# Patient Record
Sex: Female | Born: 1940
Health system: Southern US, Community
[De-identification: ages and names within clinical notes are randomized; demographics above are authoritative.]

## PROBLEM LIST (undated history)

## (undated) DIAGNOSIS — I499 Cardiac arrhythmia, unspecified: Secondary | ICD-10-CM

## (undated) DIAGNOSIS — E039 Hypothyroidism, unspecified: Secondary | ICD-10-CM

## (undated) DIAGNOSIS — I4891 Unspecified atrial fibrillation: Secondary | ICD-10-CM

## (undated) DIAGNOSIS — N189 Chronic kidney disease, unspecified: Secondary | ICD-10-CM

## (undated) DIAGNOSIS — K219 Gastro-esophageal reflux disease without esophagitis: Secondary | ICD-10-CM

## (undated) DIAGNOSIS — M1990 Unspecified osteoarthritis, unspecified site: Secondary | ICD-10-CM

## (undated) DIAGNOSIS — Z5189 Encounter for other specified aftercare: Secondary | ICD-10-CM

## (undated) DIAGNOSIS — R0600 Dyspnea, unspecified: Secondary | ICD-10-CM

## (undated) DIAGNOSIS — I10 Essential (primary) hypertension: Secondary | ICD-10-CM

## (undated) DIAGNOSIS — I82409 Acute embolism and thrombosis of unspecified deep veins of unspecified lower extremity: Secondary | ICD-10-CM

## (undated) DIAGNOSIS — E119 Type 2 diabetes mellitus without complications: Secondary | ICD-10-CM

## (undated) DIAGNOSIS — Z87442 Personal history of urinary calculi: Secondary | ICD-10-CM

## (undated) DIAGNOSIS — D649 Anemia, unspecified: Secondary | ICD-10-CM

## (undated) DIAGNOSIS — Z8639 Personal history of other endocrine, nutritional and metabolic disease: Secondary | ICD-10-CM

## (undated) DIAGNOSIS — I2699 Other pulmonary embolism without acute cor pulmonale: Secondary | ICD-10-CM

## (undated) DIAGNOSIS — R51 Headache: Secondary | ICD-10-CM

## (undated) DIAGNOSIS — C50919 Malignant neoplasm of unspecified site of unspecified female breast: Secondary | ICD-10-CM

## (undated) HISTORY — DX: Chronic kidney disease, unspecified: N18.9

## (undated) HISTORY — DX: Acute embolism and thrombosis of unspecified deep veins of unspecified lower extremity: I82.409

## (undated) HISTORY — DX: Personal history of other endocrine, nutritional and metabolic disease: Z86.39

## (undated) HISTORY — DX: Other pulmonary embolism without acute cor pulmonale: I26.99

## (undated) HISTORY — DX: Encounter for other specified aftercare: Z51.89

## (undated) HISTORY — PX: JOINT REPLACEMENT: SHX530

## (undated) HISTORY — DX: Malignant neoplasm of unspecified site of unspecified female breast: C50.919

## (undated) HISTORY — PX: MULTIPLE TOOTH EXTRACTIONS: SHX2053

## (undated) HISTORY — PX: CATARACT EXTRACTION, BILATERAL: SHX1313

## (undated) HISTORY — PX: BILATERAL HIP ARTHROSCOPY: SUR89

## (undated) HISTORY — PX: PARATHYROIDECTOMY: SHX19

## (undated) HISTORY — PX: ABDOMINAL HYSTERECTOMY: SHX81

---

## 1998-01-16 ENCOUNTER — Ambulatory Visit (HOSPITAL_COMMUNITY): Admission: RE | Admit: 1998-01-16 | Discharge: 1998-01-16 | Payer: Self-pay | Admitting: Surgery

## 1998-02-12 ENCOUNTER — Ambulatory Visit (HOSPITAL_COMMUNITY): Admission: RE | Admit: 1998-02-12 | Discharge: 1998-02-13 | Payer: Self-pay | Admitting: Surgery

## 2003-03-20 ENCOUNTER — Encounter: Payer: Self-pay | Admitting: Orthopedic Surgery

## 2003-03-25 ENCOUNTER — Ambulatory Visit (HOSPITAL_COMMUNITY): Admission: RE | Admit: 2003-03-25 | Discharge: 2003-03-25 | Payer: Self-pay | Admitting: Orthopedic Surgery

## 2008-08-18 ENCOUNTER — Encounter: Admission: RE | Admit: 2008-08-18 | Discharge: 2008-11-04 | Payer: Self-pay | Admitting: Orthopedic Surgery

## 2010-09-01 ENCOUNTER — Ambulatory Visit: Payer: MEDICARE | Attending: Orthopedic Surgery | Admitting: Physical Therapy

## 2010-09-01 DIAGNOSIS — M25569 Pain in unspecified knee: Secondary | ICD-10-CM | POA: Insufficient documentation

## 2010-09-01 DIAGNOSIS — R293 Abnormal posture: Secondary | ICD-10-CM | POA: Insufficient documentation

## 2010-09-01 DIAGNOSIS — M2560 Stiffness of unspecified joint, not elsewhere classified: Secondary | ICD-10-CM | POA: Insufficient documentation

## 2010-09-01 DIAGNOSIS — IMO0001 Reserved for inherently not codable concepts without codable children: Secondary | ICD-10-CM | POA: Insufficient documentation

## 2010-09-01 DIAGNOSIS — M545 Low back pain, unspecified: Secondary | ICD-10-CM | POA: Insufficient documentation

## 2010-09-01 DIAGNOSIS — R5381 Other malaise: Secondary | ICD-10-CM | POA: Insufficient documentation

## 2010-09-02 ENCOUNTER — Ambulatory Visit: Payer: MEDICARE | Admitting: Physical Therapy

## 2010-09-06 ENCOUNTER — Ambulatory Visit: Payer: MEDICARE | Admitting: Physical Therapy

## 2010-09-08 ENCOUNTER — Ambulatory Visit: Payer: MEDICARE | Admitting: Physical Therapy

## 2010-09-13 ENCOUNTER — Ambulatory Visit: Payer: MEDICARE | Admitting: Physical Therapy

## 2010-09-15 ENCOUNTER — Ambulatory Visit: Payer: MEDICARE | Admitting: Physical Therapy

## 2010-09-16 ENCOUNTER — Ambulatory Visit: Payer: MEDICARE | Admitting: *Deleted

## 2010-09-20 ENCOUNTER — Ambulatory Visit: Payer: MEDICARE | Admitting: Physical Therapy

## 2010-09-22 ENCOUNTER — Encounter: Payer: MEDICARE | Admitting: Physical Therapy

## 2010-12-10 NOTE — Op Note (Signed)
NAME:  [name] [name], [name] [name]                           ACCOUNT NO.:  [id number]   MEDICAL RECORD NO.:  [id number]                   PATIENT TYPE:  AMB   LOCATION:  DAY                                  FACILITY:  [facility]   PHYSICIAN:  Marlowe Kays, M.D.               DATE OF BIRTH:  1940-09-15   DATE OF PROCEDURE:  03/25/2003  DATE OF DISCHARGE:                                 OPERATIVE REPORT   PREOPERATIVE DIAGNOSES:  1. Torn lateral meniscus.  2. Osteoarthritis, right knee.   POSTOPERATIVE DIAGNOSES:  1. Torn lateral meniscus.  2. Osteoarthritis, right knee.   OPERATION:  1. Right knee arthroscopy with one partial lateral meniscectomy.  2. Removal of loose bodies.  3. Shaving of patella, medial and lateral femoral condyle.   SURGEON:  Marlowe Kays, M.D.   ASSISTANT:  Nurse.   ANESTHESIA:  General.   INDICATIONS FOR PROCEDURE:  She had the sudden onset of pain on 01/31/03,  which had been unrelenting in the right knee.  Xrays show some  osteoarthritis, but an MRI in addition shows a torn lateral meniscus.  Because of this it was felt that she would benefit from arthroscopic  procedure, with the understanding that this might not do much for her  osteoarthritic problems.   DESCRIPTION OF PROCEDURE:  Under successful general endotracheal anesthesia,  a pneumatic tourniquet placed and stabilizer.  The right knee was prepped  with Duraprep and draped in a sterile field.  Superomedial saline inflow.  First on looking in the medial joint through an anterolateral portal, she  had a marked amount of synovitis and at least one loose body was noted.  This was removed with pituitary rongeur.  She had significant wear of the  medial femoral condyle (grade 3 out of 4).  I began debriding all this down,  finding that she also had full thickness wear of the anterior portion of the  medial tibial plateau as well.  After debriding up the medial compartment of  the knee joint, the medial  meniscus was noted to be a little worn, but not  torn and did not require surgical treatment.  I then looked up into the  medial and lateral suprapatellar area; there was some moderate wear of the  patella, which I also shaved.  I then reversed portals, looking laterally.  She once again had a large amount of synovitis, which I resected.  The  lateral meniscus was torn throughout most of its inner extent, and we shaved  down until smooth.  I also debrided down the lateral femoral condyle.  The  knee joint was then irrigated until clear and all fluid possible removed.  The two-inch portals were closed with 4-0 nylon.  Marcaine 0.5% 20 cc with  adrenaline and 4 mg of  morphine were then instilled through the end flap; which was removed and  this portal closed with 4-0 nylon  as well.  Betadine and Adaptic dry sterile  dressing were applied.  The tourniquet was released.  She tolerated the  procedure well and was taken to the recovery room in satisfactory condition.  There were no complications.                                               Marlowe Kays, M.D.    JA/MEDQ  D:  03/25/2003  T:  03/25/2003  Job:  [id number]

## 2011-01-24 ENCOUNTER — Other Ambulatory Visit: Payer: Self-pay | Admitting: Orthopedic Surgery

## 2011-01-24 ENCOUNTER — Encounter (HOSPITAL_COMMUNITY): Payer: Medicare Other

## 2011-01-24 ENCOUNTER — Other Ambulatory Visit (HOSPITAL_COMMUNITY): Payer: Self-pay | Admitting: Orthopedic Surgery

## 2011-01-24 ENCOUNTER — Ambulatory Visit (HOSPITAL_COMMUNITY)
Admission: RE | Admit: 2011-01-24 | Discharge: 2011-01-24 | Disposition: A | Discharge status: Home / Self Care | Payer: Medicare Other | Source: Ambulatory Visit | Attending: Orthopedic Surgery | Admitting: Orthopedic Surgery

## 2011-01-24 DIAGNOSIS — Z01818 Encounter for other preprocedural examination: Secondary | ICD-10-CM

## 2011-01-24 DIAGNOSIS — M47814 Spondylosis without myelopathy or radiculopathy, thoracic region: Secondary | ICD-10-CM | POA: Insufficient documentation

## 2011-01-24 DIAGNOSIS — M1610 Unilateral primary osteoarthritis, unspecified hip: Secondary | ICD-10-CM | POA: Insufficient documentation

## 2011-01-24 DIAGNOSIS — Z01812 Encounter for preprocedural laboratory examination: Secondary | ICD-10-CM | POA: Insufficient documentation

## 2011-01-24 DIAGNOSIS — M169 Osteoarthritis of hip, unspecified: Secondary | ICD-10-CM | POA: Insufficient documentation

## 2011-01-24 DIAGNOSIS — Z79899 Other long term (current) drug therapy: Secondary | ICD-10-CM | POA: Insufficient documentation

## 2011-01-24 DIAGNOSIS — M4130 Thoracogenic scoliosis, site unspecified: Secondary | ICD-10-CM | POA: Insufficient documentation

## 2011-01-24 LAB — SURGICAL PCR SCREEN
MRSA, PCR: NEGATIVE
Staphylococcus aureus: POSITIVE — AB

## 2011-01-24 LAB — URINE MICROSCOPIC-ADD ON

## 2011-01-24 LAB — DIFFERENTIAL
Basophils Absolute: 0 10*3/uL (ref 0.0–0.1)
Eosinophils Relative: 1 % (ref 0–5)
Monocytes Relative: 16 % — ABNORMAL HIGH (ref 3–12)
Neutro Abs: 2.2 10*3/uL (ref 1.7–7.7)

## 2011-01-24 LAB — URINALYSIS, ROUTINE W REFLEX MICROSCOPIC
Glucose, UA: NEGATIVE mg/dL
Hgb urine dipstick: NEGATIVE
Ketones, ur: NEGATIVE mg/dL
Protein, ur: NEGATIVE mg/dL
Specific Gravity, Urine: 1.018 (ref 1.005–1.030)
pH: 6 (ref 5.0–8.0)

## 2011-01-24 LAB — APTT: aPTT: 31 seconds (ref 24–37)

## 2011-01-24 LAB — CBC
HCT: 38 % (ref 36.0–46.0)
MCH: 29.3 pg (ref 26.0–34.0)
MCHC: 33.4 g/dL (ref 30.0–36.0)
Platelets: 194 10*3/uL (ref 150–400)
RBC: 4.33 MIL/uL (ref 3.87–5.11)
WBC: 4.7 10*3/uL (ref 4.0–10.5)

## 2011-01-24 LAB — BASIC METABOLIC PANEL
CO2: 34 mEq/L — ABNORMAL HIGH (ref 19–32)
Chloride: 100 mEq/L (ref 96–112)
Glucose, Bld: 97 mg/dL (ref 70–99)
Potassium: 4.1 mEq/L (ref 3.5–5.1)
Sodium: 140 mEq/L (ref 135–145)

## 2011-01-24 LAB — PROTIME-INR: INR: 0.99 (ref 0.00–1.49)

## 2011-01-31 NOTE — H&P (Addendum)
[name] [name], [name] [name] NO.:  [id number]  MEDICAL RECORD NO.:  [id number]  LOCATION:                                 FACILITY:  PHYSICIAN:  Madlyn Frankel. Charlann Boxer, M.D.  DATE OF BIRTH:  1940/10/03  DATE OF ADMISSION: DATE OF DISCHARGE:                             HISTORY & PHYSICAL   DATE OF SURGERY:  February 01, 2011  ADMISSION DIAGNOSIS:  Left hip osteoarthritis.  HISTORY OF PRESENT ILLNESS:  This is a 70 year old lady with a history of osteoarthritis of the left hip with failure of conservative treatment to alleviate her pain.  She is now scheduled for anterior total hip arthroplasty.  Surgery risks, benefits, and aftercare were discussed with the patient.  Questions invited and answered.  Note that she is a candidate for tranexamic acid and will receive that at surgery.  She is given her home medications of aspirin, Robaxin, iron, MiraLax, and Colace to take postoperatively.  Her medical doctor is Dr. Prudy Feeler.  PAST MEDICAL HISTORY:  Drug allergies none.  CURRENT MEDICATIONS: 1. Meloxicam 7.5 mg one daily. 2. Hydrochlorothiazide 50 mg one daily. 3. Verapamil 120 mg one b.i.d. 4. Levothyroxine 25 mcg one daily. 5. Valproic acid 250 mg one b.i.d. 6. Pravastatin 40 mg one daily. 7. Oxybutynin 5 mg one b.i.d. 8. Tramadol 50 mg p.r.n. 9. Oxycodone p.r.n.  MEDICAL ILLNESSES:  Include, hypertension, hypothyroidism, tachycardia, hyperlipidemia.  Also, history of migraines.  PREVIOUS SURGERIES:  Include, hysterectomy, parathyroid surgery, and arthroscopic knee surgery on the right.  FAMILY HISTORY:  Positive for heart disease, cancer, pulmonary embolus in a brother, and heart attack.  SOCIAL HISTORY:  The patient is married.  She lives at home.  She does not smoke and does not drink.  REVIEW OF SYSTEMS:  CENTRAL NERVOUS SYSTEM:  Negative for headache, blurred vision, or dizziness.  PULMONARY:  Negative for shortness of breath, PND, and orthopnea.   CARDIOVASCULAR:  Negative for chest pain or palpitation.  Positive for occasional rapid heart rate.  GI:  Negative for ulcers or hepatitis.  GU:  Negative for urinary tract difficulties so she had a recent UTI that was treated.  MUSCULOSKELETAL:  Positive in HPI.  PHYSICAL EXAM:  VITAL SIGNS:  BP 160/82, respirations 16, pulse 72 and regular. GENERAL APPEARANCE:  This is an obese lady in no acute distress. HEENT:  Head normocephalic.  Nose patent.  Ears patent.  Pupils are equal, round, reactive to light.  Throat without injection. NECK:  Supple without adenopathy.  Carotids are 2+ without bruit. CHEST:  Clear to auscultation.  No rales or rhonchi.  Respirations 60. HEART:  Regular rate and rhythm at 72 beats without murmur. ABDOMEN:  Soft.  Active bowel sounds.  No masses or organomegaly. NEUROLOGIC:  The patient is alert and oriented to time, place, and person.  Cranial nerves II-XII grossly intact. EXTREMITIES:  Shows left hip with decreased range of motion with pain to any range of motion.  Neurovascular status intact.  ASSESSMENT:  Left hip osteoarthritis.  PLAN:  Left total hip arthroplasty by anterior approach.     Jaquelyn Bitter. Chabon, P.A.   ______________________________ Madlyn Frankel Charlann Boxer, M.D.  SJC/MEDQ  D:  01/19/2011  T:  01/20/2011  Job:  [id number]  Electronically Signed by Jodene Nam P.A. on 01/30/2011 04:47:56 PM Electronically Signed by Durene Romans M.D. on 01/31/2011 09:12:57 AM

## 2011-02-01 ENCOUNTER — Inpatient Hospital Stay (HOSPITAL_COMMUNITY)
Admission: RE | Admit: 2011-02-01 | Discharge: 2011-02-04 | DRG: 470 | Disposition: A | Discharge status: Home Health | Payer: Medicare Other | Source: Ambulatory Visit | Attending: Orthopedic Surgery | Admitting: Orthopedic Surgery

## 2011-02-01 ENCOUNTER — Inpatient Hospital Stay (HOSPITAL_COMMUNITY): Payer: Medicare Other

## 2011-02-01 DIAGNOSIS — I10 Essential (primary) hypertension: Secondary | ICD-10-CM | POA: Diagnosis present

## 2011-02-01 DIAGNOSIS — M169 Osteoarthritis of hip, unspecified: Principal | ICD-10-CM | POA: Diagnosis present

## 2011-02-01 DIAGNOSIS — R000 Tachycardia, unspecified: Secondary | ICD-10-CM | POA: Diagnosis present

## 2011-02-01 DIAGNOSIS — M1610 Unilateral primary osteoarthritis, unspecified hip: Principal | ICD-10-CM | POA: Diagnosis present

## 2011-02-01 DIAGNOSIS — E785 Hyperlipidemia, unspecified: Secondary | ICD-10-CM | POA: Diagnosis present

## 2011-02-01 DIAGNOSIS — E039 Hypothyroidism, unspecified: Secondary | ICD-10-CM | POA: Diagnosis present

## 2011-02-01 LAB — TYPE AND SCREEN: ABO/RH(D): O POS

## 2011-02-01 NOTE — Op Note (Signed)
NAMEJONNIE, [name] [name] NO.:  [id number]  MEDICAL RECORD NO.:  [id number]  LOCATION:  0005                         FACILITY:  [facility]  PHYSICIAN:  Madlyn Frankel. Charlann Boxer, M.D.  DATE OF BIRTH:  June 06, 1941  DATE OF PROCEDURE:  02/01/2011 DATE OF DISCHARGE:                              OPERATIVE REPORT   PREOPERATIVE DIAGNOSIS:  Left hip osteoarthritis.  POSTOPERATIVE DIAGNOSIS:  Left hip osteoarthritis.  PROCEDURE:  Left total hip replacement through an anterior approach.  COMPONENTS USED:  A DePuy hip system, size 50 pinnacle cup, 32 plus 4 neutral AltrX liner, size 4 standard trial lock stem with a 32 plus 5 delta ceramic ball.  SURGEON:  Madlyn Frankel. Charlann Boxer, M.D.  ASSISTANT:  Jaquelyn Bitter. Chabon, P.A.  ANESTHESIA:  General.  SPECIMENS:  None.  COMPLICATIONS:  None apparent.  BLOOD LOSS:  600 mL.  DRAINS:  One Hemovac.  INDICATIONS FOR PROCEDURE:  [name] [name] is a 70 year old female who presented to the office for evaluation of left hip pain.  Radiographs revealed degenerative changes with excessive wear of femoral head with associated notching.  Conservative measures failed to provide any significant relief.  Consent was obtained after reviewing risks of infection, DVT, component failure, need for revision surgery.  We discussed options of surgical approach anterior versus posterior and chose for anterior left total hip replacement.  Consent obtained for benefit of pain relief.  PROCEDURE IN DETAIL:  The patient was brought to operative theater. Once adequate anesthesia, preoperative antibiotics, Ancef administered, the patient was positioned supine on the [employer] table.  The left hip was pre-draped and then fluoroscopic imaging used to confirm landmarks and positioning.  The left lower extremity was then prepped and draped to allow for anterior approach to the hip with a shower curtain technique.  A time- out performed, identifying the patient, planned  procedure and extremity.  An incision was made 2 cm lateral and inferior to the anterior-superior iliac spine, extending over the boundary of the tensor fascia lata muscle.  Dissection patient was carried down to the fascia which was then incised in line with its fibers.  The muscles swept laterally and a superior retractor placed.  Pericapsular fat and circumflex vessels were cauterized and debrided. Inferior neck retractor placed.  Following elevating the rectus off the anterior acetabulum, a capsulotomy was made, preserving the anterior, inferior and superior leaflets.  Stay sutures were placed and retractors placed intra-articular.  The patient's obvious femoral head deformity was noted.  At this point, the neck osteotomy had been made following confirmation radiographs and confirming location of anatomy.  At this point, traction was taken off the femur.  Femoral head was removed without difficulty.  Acetabular retractors placed posterior and anterior.  Labrum and soft tissues debrided.  I began reaming with 45 reamer, reamed up to 49 reamer with excellent bony bed preparation.  A final 50 cup was chosen under fluoroscopic imaging, the cup was impacted and well seated and confirmed in its orientation with regards to abduction and anatomically with posterior cup.  Portion of the cup exposed posteriorly, confirmed anatomic forward flexion.  Single cancellous screw was used to support this and  the hole eliminator placed.  Final 32 plus 4 neutral liner was then impacted.  Attention was now directed to femur.  The femur was rotated out to 80 degrees and inferior capsule removed off the neck, allowed for medial retractor.  The hook was placed laterally over the trochanteric ridge. The leg was extended and adducted and retractors placed posteriorly allowing for partial release of the posterior structures and exposure of the posterior aspect of the hip.  Box osteotome was used to  set orientation followed by the use of a starting broach which was passed by hand.  I then broached with a size 1 broach and at this point confirmed the orientation of the stem in the AP and lateral planes at 90 degrees orthogonal views with retractors out and the leg up in a neutral position.  The leg was then re-extended, retractors were placed and ended up broaching to a size 4 broach with excellent metaphyseal fit.  Did a trial reduction with standard neck based off for radiographic template and 32 plus 1.5 ball.  With this, the leg lengths appeared to be very comparable and maybe a little bit shorter on this left side but otherwise good component orientation and stability noted.  The trials were removed.  The final four standard trial lock stem was chosen and impacted, basically sitting at the level where the broach was.  Based on this and the fact that I felt the knee had a little bit of leg length, I chose a 32 plus 5 delta ceramic ball to help get a couple more millimeters in length.  It would not affect her overall as her right hip does have some arthritic changes as well.  The final 32 plus 5 delta ceramic ball was impacted onto clean and dry trunnion and the hip reduced.  We irrigated the hip throughout the case and again at this point, I reapproximated the anterior capsular leaflets using #1 Vicryl.  I then placed a medium Hemovac drain deep to the tensor fascia muscle on top of the capsule.  The tensor fascia was then reapproximated using #1 Vicryl.  The remainder of the wound was closed with 2-0 Vicryl and running 4-0 Monocryl.  The hip was cleaned, dried and dressed sterilely using Dermabond and Aquacel dressing.  Drain site dressed separately.  The patient was brought to recovery room in stable condition, tolerating the procedure well.     Madlyn Frankel Charlann Boxer, M.D.     MDO/MEDQ  D:  02/01/2011  T:  02/01/2011  Job:  [id number]  Electronically Signed by Durene Romans  M.D. on 02/01/2011 06:18:11 PM

## 2011-02-02 LAB — BASIC METABOLIC PANEL
Calcium: 9.7 mg/dL (ref 8.4–10.5)
Chloride: 101 mEq/L (ref 96–112)
Creatinine, Ser: 0.71 mg/dL (ref 0.50–1.10)
GFR calc Af Amer: >60 mL/min (ref >60)
Sodium: 139 mEq/L (ref 135–145)

## 2011-02-02 LAB — CBC
MCH: 30.2 pg (ref 26.0–34.0)
MCV: 88.2 fL (ref 78.0–100.0)
Platelets: 147 10*3/uL — ABNORMAL LOW (ref 150–400)
RBC: 3.31 MIL/uL — ABNORMAL LOW (ref 3.87–5.11)
RDW: 12.9 % (ref 11.5–15.5)

## 2011-02-03 LAB — CBC
Platelets: 165 10*3/uL (ref 150–400)
RBC: 3.5 MIL/uL — ABNORMAL LOW (ref 3.87–5.11)
RDW: 13.3 % (ref 11.5–15.5)
WBC: 10.9 10*3/uL — ABNORMAL HIGH (ref 4.0–10.5)

## 2011-02-03 LAB — BASIC METABOLIC PANEL
CO2: 32 mEq/L (ref 19–32)
Chloride: 100 mEq/L (ref 96–112)
GFR calc Af Amer: >60 mL/min (ref >60)
Potassium: 3.2 mEq/L — ABNORMAL LOW (ref 3.5–5.1)
Sodium: 140 mEq/L (ref 135–145)

## 2011-03-02 NOTE — Discharge Summary (Addendum)
[name] [name], [name] NO.:  [id number]  MEDICAL RECORD NO.:  [id number]  LOCATION:  1539                         FACILITY:  [facility]  PHYSICIAN:  Madlyn Frankel. Charlann Boxer, M.D.  DATE OF BIRTH:  19-Oct-1940  DATE OF ADMISSION:  02/01/2011 DATE OF DISCHARGE:  02/04/2011                              DISCHARGE SUMMARY   PROCEDURE:  Left total hip arthroplasty.  ADMITTING DIAGNOSIS:  Left hip osteoarthritis.  DISCHARGE DIAGNOSES: 1. Status post left total hip arthroplasty. 2. Hypertension. 3. Hypothyroidism. 4. Tachycardia. 5. Hyperlipidemia. 6. History of migraines.  HISTORY OF PRESENT ILLNESS:  The patient is a 70 year old lady with a history of osteoarthritis of the left hip with failure conservative treatment to alleviate her pain.  X-rays in the clinic showed osteoarthritis of the hip.  Various options were discussed with the patient.  The patient wished to proceed with surgery.  Risks, benefits, and expectations of the procedure were discussed with patient.  The patient understands the risks, benefits, and expectations and wishes to proceed with left total hip arthroplasty.  Questions were invited and answered, and she is a candidate for tranexamic acid and received prior to surgery.  She has been given her home medications and will be discharged home once again discharged from the hospital.  HOSPITAL COURSE:  The patient underwent the above-stated procedure on February 01, 2011.  The patient tolerated the procedure well and was brought to the recovery room in good condition and subsequently to the floor. Postop day #1, the patient was doing well, afebrile.  Vital signs stable.  Hematocrit 29.2.  The patient was neurovascularly intact. Dressing was clear, dry, and intact.  Hemovac was removed and the IV was changed to saline lock.  The patient was started PT/OT.  On postop day #2, February 03, 2011, the patient was doing okay, but feeling very sleepy. She was afebrile.   Vital signs stable.  Hematocrit 31.7.  Left hip was dry.  On postop day #3, July 13. 2012, the patient was doing much better.  This morning, I felt that she was ready to go home.  She was afebrile.  Vital signs stable.  Left hip dressing was clear, dry, and intact.  Neurovascularly intact.  The patient felt to be good and well enough to go home after physial therapy.  DISCHARGE INSTRUCTIONS:  The patient was discharged on February 04, 2011. The patient was maintained the surgical dressing for 8 days and removed and replaced with gauze and tape.  The patient is to keep the area dry and clean until follows up.  The patient is to follow up with Dr. Charlann Boxer with the [facility] in 2 weeks.  DISCHARGE MEDICATIONS: 1. Colace 100 mg 1 p.o. b.i.d. p.r.n. constipation. 2. Enteric coated aspirin 325 mg 1 p.o. b.i.d. for 6 weeks. 3. Ferrous sulfate 325 mg 1 p.o. t.i.d. for 2-3 weeks. 4. Robaxin 500 mg 1 p.o. q.6 h. p.r.n. muscle spasms. 5. Oxycodone IR 5-10 mg p.o. q.4-6 h. p.r.n. pain. 6. Xarelto 10 mg 1 p.o. daily for 10 days. 7. Hydrochlorothiazide 50 mg 1 p.o. q.a.m. 8. Levothyroxine 25 mcg 1 p.o. q.h.s. 9. Meloxicam 7.5 1 p.o. q.a.m.  10.Multivitamin 1 p.o. daily. 11.Oxybutynin 5 mg 1 p.o. b.i.d. 12.Pravastatin 40 mg 1 p.o. q.a.m. 13.Verapamil 120 mg 1 p.o. q.a.m. 14.Valproic acid 250 mg 1 p.o. b.i.d.    ______________________________ Lanney Gins, PA   ______________________________ Madlyn Frankel. Charlann Boxer, M.D.    MB/MEDQ  D:  02/23/2011  T:  02/24/2011  Job:  [id number]  Electronically Signed by Lanney Gins PA on 03/02/2011 04:08:46 PM Electronically Signed by Durene Romans M.D. on 03/07/2011 07:32:40 AM Electronically Signed by Durene Romans M.D. on 03/07/2011 07:32:30 AM

## 2011-05-31 ENCOUNTER — Encounter: Payer: Self-pay | Admitting: Oncology

## 2011-06-01 ENCOUNTER — Encounter: Payer: Medicare Other | Admitting: Oncology

## 2011-06-01 ENCOUNTER — Other Ambulatory Visit: Payer: Medicare Other | Admitting: Lab

## 2011-06-01 ENCOUNTER — Ambulatory Visit: Payer: Medicare Other

## 2011-06-02 NOTE — Progress Notes (Signed)
This encounter was created in error - please disregard.

## 2013-10-08 NOTE — Progress Notes (Signed)
Surgery on 10/29/13.  Need orders in EPIC.  Thank You. 

## 2013-10-16 ENCOUNTER — Encounter (HOSPITAL_COMMUNITY): Payer: Self-pay | Admitting: Pharmacy Technician

## 2013-10-22 ENCOUNTER — Ambulatory Visit (HOSPITAL_COMMUNITY)
Admission: RE | Admit: 2013-10-22 | Discharge: 2013-10-22 | Disposition: A | Discharge status: Home / Self Care | Payer: Medicare Other | Source: Ambulatory Visit | Attending: Orthopedic Surgery | Admitting: Orthopedic Surgery

## 2013-10-22 ENCOUNTER — Encounter (HOSPITAL_COMMUNITY): Payer: Self-pay

## 2013-10-22 ENCOUNTER — Encounter (INDEPENDENT_AMBULATORY_CARE_PROVIDER_SITE_OTHER): Payer: Self-pay

## 2013-10-22 ENCOUNTER — Encounter (HOSPITAL_COMMUNITY)
Admission: RE | Admit: 2013-10-22 | Discharge: 2013-10-22 | Disposition: A | Discharge status: Home / Self Care | Payer: Medicare Other | Source: Ambulatory Visit | Attending: Orthopedic Surgery | Admitting: Orthopedic Surgery

## 2013-10-22 DIAGNOSIS — Z853 Personal history of malignant neoplasm of breast: Secondary | ICD-10-CM | POA: Insufficient documentation

## 2013-10-22 DIAGNOSIS — Z01818 Encounter for other preprocedural examination: Secondary | ICD-10-CM | POA: Insufficient documentation

## 2013-10-22 DIAGNOSIS — Z8679 Personal history of other diseases of the circulatory system: Secondary | ICD-10-CM | POA: Insufficient documentation

## 2013-10-22 HISTORY — DX: Headache: R51

## 2013-10-22 HISTORY — DX: Unspecified osteoarthritis, unspecified site: M19.90

## 2013-10-22 HISTORY — DX: Hypothyroidism, unspecified: E03.9

## 2013-10-22 HISTORY — DX: Essential (primary) hypertension: I10

## 2013-10-22 LAB — CBC
HCT: 38.5 % (ref 36.0–46.0)
Hemoglobin: 12.8 g/dL (ref 12.0–15.0)
MCH: 29.5 pg (ref 26.0–34.0)
MCHC: 33.2 g/dL (ref 30.0–36.0)
MCV: 88.7 fL (ref 78.0–100.0)
Platelets: 230 10*3/uL (ref 150–400)
RBC: 4.34 MIL/uL (ref 3.87–5.11)
RDW: 13 % (ref 11.5–15.5)
WBC: 5.9 10*3/uL (ref 4.0–10.5)

## 2013-10-22 LAB — URINE MICROSCOPIC-ADD ON

## 2013-10-22 LAB — URINALYSIS, ROUTINE W REFLEX MICROSCOPIC
BILIRUBIN URINE: NEGATIVE
Glucose, UA: NEGATIVE mg/dL
HGB URINE DIPSTICK: NEGATIVE
Ketones, ur: NEGATIVE mg/dL
Nitrite: NEGATIVE
PROTEIN: NEGATIVE mg/dL
Specific Gravity, Urine: 1.016 (ref 1.005–1.030)
UROBILINOGEN UA: 0.2 mg/dL (ref 0.0–1.0)
pH: 5 (ref 5.0–8.0)

## 2013-10-22 LAB — BASIC METABOLIC PANEL WITH GFR
BUN: 23 mg/dL (ref 6–23)
CO2: 30 meq/L (ref 19–32)
Calcium: 11.2 mg/dL — ABNORMAL HIGH (ref 8.4–10.5)
Chloride: 102 meq/L (ref 96–112)
Creatinine, Ser: 0.88 mg/dL (ref 0.50–1.10)
GFR calc Af Amer: 74 mL/min — ABNORMAL LOW
GFR calc non Af Amer: 64 mL/min — ABNORMAL LOW
Glucose, Bld: 131 mg/dL — ABNORMAL HIGH (ref 70–99)
Potassium: 4.5 meq/L (ref 3.7–5.3)
Sodium: 144 meq/L (ref 137–147)

## 2013-10-22 LAB — PROTIME-INR
INR: 1 (ref 0.00–1.49)
Prothrombin Time: 13 s (ref 11.6–15.2)

## 2013-10-22 LAB — SURGICAL PCR SCREEN
MRSA, PCR: NEGATIVE
Staphylococcus aureus: POSITIVE — AB

## 2013-10-22 LAB — APTT: aPTT: 26 s (ref 24–37)

## 2013-10-22 NOTE — Pre-Procedure Instructions (Addendum)
10-22-13 EKG 10-09-13 -report with chart. Surgery Clearance note 3'15. CXR done today. 10-22-13 1510 Fax to [phone] Dr. Aurea Graff office and patient -Has Positive Staph aureus PCR screen , will need tx. With Mupirocin and Rx. Called to [place], [place]. Pt. Aware to pick up and use as directed. Dr. Aurea Graff office aware of labs viewable in Epic and to note. W. Floy Sabina

## 2013-10-22 NOTE — Patient Instructions (Addendum)
[place]  10/22/2013   Your procedure is scheduled on:   10-29-2013  Report to [place] at      [place]  AM .  Call this number if you have problems the morning of surgery: [phone]  Or Presurgical Testing [phone](Bracha Frankowski) For Living Will and/or Health Care Power Attorney Forms: please provide copy for your medical record,may bring AM of surgery(Forms should be already notarized -we do not provide this service).(10-22-13 to provide both documents AM of 10-29-13 to be placed with medical records).     Do not eat food:After Midnight.  .  Take these medicines the morning of surgery with A SIP OF WATER:  Amlodipine. Levothyroxine. Oxybutynin. Simvastatin. Valproic Acid. Tylenol #3 if needed.  Do not wear jewelry, make-up or nail polish.  Do not wear lotions, powders, or perfumes. You may wear deodorant.  Do not shave 48 hours(2 days) prior to first CHG shower(legs and under arms).(Shaving face and neck okay.)  Do not bring valuables to the hospital.(Hospital is not responsible for lost valuables).  Contacts, dentures or removable bridgework, body piercing, hair pins may not be worn into surgery.  Leave suitcase in the car. After surgery it may be brought to your room.  For patients admitted to the hospital, checkout time is 11:00 AM the day of discharge.(Restricted visitors-Any Persons displaying flu-like symptoms or illness).    Patients discharged the day of surgery will not be allowed to drive home. Must have responsible person with you x 24 hours once discharged.  Name and phone number of your driver: Glory Buff, daughter [phone] cell  Special Instructions: CHG(Chlorhedine 4%-"Hibiclens","Betasept","Aplicare") Shower Use Special Wash: see special instructions.(avoid face and genitals)   Please read over the following fact sheets that you were given: MRSA Information, Blood Transfusion fact sheet, Incentive Spirometry Instruction.  Remember : Type/Screen  "Blue armbands" - may not be removed once applied(would result in being retested AM of surgery, if removed).  Failure to follow these instructions may result in Cancellation of your surgery.   Patient signature_______________________________________________________

## 2013-10-27 NOTE — H&P (Signed)
TOTAL HIP ADMISSION H&P  Patient is admitted for right total hip arthroplasty, anterior approach.  Subjective:  Chief Complaint: Right hip OA / pain  HPI: [name] [name], 73 y.o. female, has a history of pain and functional disability in the right hip(s) due to arthritis and patient has failed non-surgical conservative treatments for greater than 12 weeks to include NSAID's and/or analgesics, use of assistive devices and activity modification.  Onset of symptoms was gradual starting 5 months ago with gradually worsening course since that time.The patient noted prior procedures of the hip to include arthroplasty on the left hip on February 01, 2011 per Dr. Alvan Dame.  Patient currently rates pain in the right hip at 10 out of 10 with activity. Patient has night pain, worsening of pain with activity and weight bearing, trendelenberg gait, pain that interfers with activities of daily living and pain with passive range of motion. Patient has evidence of periarticular osteophytes and joint space narrowing by imaging studies. This condition presents safety issues increasing the risk of falls.   There is no current active infection.  Risks, benefits and expectations were discussed with the patient.  Risks including but not limited to the risk of anesthesia, blood clots, nerve damage, blood vessel damage, failure of the prosthesis, infection and up to and including death.  Patient understand the risks, benefits and expectations and wishes to proceed with surgery.   D/C Plans:   Home with HHPT  Post-op Meds:     No Rx given   Tranexamic Acid:   To be given  Decadron:    To be given  FYI:    ASA post-op  Norco post-op    Past Medical History  Diagnosis Date  . Breast cancer 1997    Left sided Stage II-  Adjuvant- Chemo AC & Radiation  . Hypertension   . Hypothyroidism   . Headache(784.0)     tx. Valproic acid  . Arthritis     Ostearthritis- hips, knees, fingers    Past Surgical History  Procedure  Laterality Date  . Joint replacement Left     [id number]  . Abdominal hysterectomy    . Parathyroidectomy    . Multiple tooth extractions      60's    No prescriptions prior to admission   No Known Allergies   History  Substance Use Topics  . Smoking status: Never Smoker   . Smokeless tobacco: Not on file  . Alcohol Use: No    No family history on file.   Review of Systems  Constitutional: Negative.   Eyes: Negative.   Respiratory: Negative.   Cardiovascular: Negative.   Gastrointestinal: Negative.   Genitourinary: Positive for frequency.  Musculoskeletal: Positive for joint pain.  Skin: Negative.   Neurological: Positive for headaches.  Endo/Heme/Allergies: Negative.   Psychiatric/Behavioral: Negative.     Objective:  Physical Exam  Constitutional: She is oriented to person, place, and time. She appears well-developed and well-nourished.  HENT:  Head: Normocephalic and atraumatic.  Mouth/Throat: Oropharynx is clear and moist.  Eyes: Pupils are equal, round, and reactive to light.  Neck: Neck supple. No JVD present. No tracheal deviation present. No thyromegaly present.  Cardiovascular: Normal rate, regular rhythm, normal heart sounds and intact distal pulses.   Respiratory: Effort normal and breath sounds normal. No stridor. No respiratory distress. She has no wheezes.  GI: Soft. There is no tenderness. There is no guarding.  Musculoskeletal:       Right hip: She exhibits decreased range of  motion, decreased strength, tenderness and bony tenderness. She exhibits no swelling, no deformity and no laceration.  Lymphadenopathy:    She has no cervical adenopathy.  Neurological: She is alert and oriented to person, place, and time.  Skin: Skin is warm and dry.  Psychiatric: She has a normal mood and affect.     Imaging Review Plain radiographs demonstrate severe degenerative joint disease of the right hip(s). The bone quality appears to be good for age and reported  activity level.  Assessment/Plan:  End stage arthritis, right hip(s)  The patient history, physical examination, clinical judgement of the provider and imaging studies are consistent with end stage degenerative joint disease of the right hip(s) and total hip arthroplasty is deemed medically necessary. The treatment options including medical management, injection therapy, arthroscopy and arthroplasty were discussed at length. The risks and benefits of total hip arthroplasty were presented and reviewed. The risks due to aseptic loosening, infection, stiffness, dislocation/subluxation,  thromboembolic complications and other imponderables were discussed.  The patient acknowledged the explanation, agreed to proceed with the plan and consent was signed. Patient is being admitted for inpatient treatment for surgery, pain control, PT, OT, prophylactic antibiotics, VTE prophylaxis, progressive ambulation and ADL's and discharge planning.The patient is planning to be discharged home with home health services.      [place] Derrell Milanes   PAC  10/27/2013, 6:26 PM

## 2013-10-29 ENCOUNTER — Encounter (HOSPITAL_COMMUNITY): Payer: Self-pay | Admitting: *Deleted

## 2013-10-29 ENCOUNTER — Inpatient Hospital Stay (HOSPITAL_COMMUNITY): Payer: Medicare Other

## 2013-10-29 ENCOUNTER — Encounter (HOSPITAL_COMMUNITY)
Admission: RE | Disposition: A | Discharge status: Home Health | Payer: Self-pay | Source: Ambulatory Visit | Attending: Orthopedic Surgery

## 2013-10-29 ENCOUNTER — Encounter (HOSPITAL_COMMUNITY): Payer: Medicare Other | Admitting: Certified Registered Nurse Anesthetist

## 2013-10-29 ENCOUNTER — Inpatient Hospital Stay (HOSPITAL_COMMUNITY)
Admission: RE | Admit: 2013-10-29 | Discharge: 2013-10-30 | DRG: 470 | Disposition: A | Discharge status: Home Health | Payer: Medicare Other | Source: Ambulatory Visit | Attending: Orthopedic Surgery | Admitting: Orthopedic Surgery

## 2013-10-29 ENCOUNTER — Inpatient Hospital Stay (HOSPITAL_COMMUNITY): Payer: Medicare Other | Admitting: Certified Registered Nurse Anesthetist

## 2013-10-29 DIAGNOSIS — K219 Gastro-esophageal reflux disease without esophagitis: Secondary | ICD-10-CM | POA: Diagnosis present

## 2013-10-29 DIAGNOSIS — Z6841 Body Mass Index (BMI) 40.0 and over, adult: Secondary | ICD-10-CM

## 2013-10-29 DIAGNOSIS — Z853 Personal history of malignant neoplasm of breast: Secondary | ICD-10-CM

## 2013-10-29 DIAGNOSIS — D500 Iron deficiency anemia secondary to blood loss (chronic): Secondary | ICD-10-CM | POA: Diagnosis not present

## 2013-10-29 DIAGNOSIS — D62 Acute posthemorrhagic anemia: Secondary | ICD-10-CM | POA: Diagnosis not present

## 2013-10-29 DIAGNOSIS — I10 Essential (primary) hypertension: Secondary | ICD-10-CM | POA: Diagnosis present

## 2013-10-29 DIAGNOSIS — E039 Hypothyroidism, unspecified: Secondary | ICD-10-CM | POA: Diagnosis present

## 2013-10-29 DIAGNOSIS — D649 Anemia, unspecified: Secondary | ICD-10-CM | POA: Diagnosis not present

## 2013-10-29 DIAGNOSIS — Z96649 Presence of unspecified artificial hip joint: Secondary | ICD-10-CM

## 2013-10-29 DIAGNOSIS — M169 Osteoarthritis of hip, unspecified: Principal | ICD-10-CM | POA: Diagnosis present

## 2013-10-29 DIAGNOSIS — M1610 Unilateral primary osteoarthritis, unspecified hip: Principal | ICD-10-CM | POA: Diagnosis present

## 2013-10-29 HISTORY — PX: TOTAL HIP ARTHROPLASTY: SHX124

## 2013-10-29 LAB — TYPE AND SCREEN
ABO/RH(D): O POS
ANTIBODY SCREEN: NEGATIVE

## 2013-10-29 SURGERY — ARTHROPLASTY, HIP, TOTAL, ANTERIOR APPROACH
Anesthesia: Spinal | Site: Hip | Laterality: Right

## 2013-10-29 MED ORDER — FUROSEMIDE 40 MG PO TABS
40.0000 mg | ORAL_TABLET | Freq: Every day | ORAL | Status: DC
  Administered 2013-10-30: 40 mg via ORAL
  Filled 2013-10-29: qty 1

## 2013-10-29 MED ORDER — FENTANYL CITRATE 0.05 MG/ML IJ SOLN
INTRAMUSCULAR | Status: AC
  Filled 2013-10-29: qty 2

## 2013-10-29 MED ORDER — HYDROMORPHONE HCL PF 1 MG/ML IJ SOLN: 0.5000 mg | INTRAMUSCULAR | Status: DC | PRN

## 2013-10-29 MED ORDER — POTASSIUM CHLORIDE 2 MEQ/ML IV SOLN
INTRAVENOUS | Status: DC
  Administered 2013-10-29 – 2013-10-30 (×2): via INTRAVENOUS
  Filled 2013-10-29 (×5): qty 1000

## 2013-10-29 MED ORDER — ROCURONIUM BROMIDE 100 MG/10ML IV SOLN
INTRAVENOUS | Status: DC | PRN
  Administered 2013-10-29: 30 mg via INTRAVENOUS

## 2013-10-29 MED ORDER — EPHEDRINE SULFATE 50 MG/ML IJ SOLN
INTRAMUSCULAR | Status: DC | PRN
  Administered 2013-10-29: 10 mg via INTRAVENOUS

## 2013-10-29 MED ORDER — HYDROMORPHONE HCL PF 1 MG/ML IJ SOLN
0.2500 mg | INTRAMUSCULAR | Status: DC | PRN
  Administered 2013-10-29: 0.25 mg via INTRAVENOUS
  Administered 2013-10-29: 0.5 mg via INTRAVENOUS
  Administered 2013-10-29: 0.25 mg via INTRAVENOUS

## 2013-10-29 MED ORDER — PROPOFOL 10 MG/ML IV BOLUS
INTRAVENOUS | Status: DC | PRN
  Administered 2013-10-29: 180 mg via INTRAVENOUS

## 2013-10-29 MED ORDER — ATORVASTATIN CALCIUM 20 MG PO TABS
20.0000 mg | ORAL_TABLET | Freq: Every day | ORAL | Status: DC
  Administered 2013-10-29: 20 mg via ORAL
  Filled 2013-10-29 (×2): qty 1

## 2013-10-29 MED ORDER — ASPIRIN EC 325 MG PO TBEC
325.0000 mg | DELAYED_RELEASE_TABLET | Freq: Two times a day (BID) | ORAL | Status: DC
  Administered 2013-10-29 – 2013-10-30 (×2): 325 mg via ORAL
  Filled 2013-10-29 (×4): qty 1

## 2013-10-29 MED ORDER — SENNA 8.6 MG PO TABS
1.0000 | ORAL_TABLET | Freq: Two times a day (BID) | ORAL | Status: DC
  Administered 2013-10-29 – 2013-10-30 (×2): 8.6 mg via ORAL

## 2013-10-29 MED ORDER — HYDROMORPHONE HCL PF 2 MG/ML IJ SOLN
INTRAMUSCULAR | Status: AC
  Filled 2013-10-29: qty 1

## 2013-10-29 MED ORDER — OXYCODONE HCL 5 MG PO TABS: 5.0000 mg | ORAL_TABLET | Freq: Once | ORAL | Status: DC | PRN

## 2013-10-29 MED ORDER — SIMVASTATIN 40 MG PO TABS: 40.0000 mg | ORAL_TABLET | Freq: Every day | ORAL | Status: DC

## 2013-10-29 MED ORDER — GLYCOPYRROLATE 0.2 MG/ML IJ SOLN
INTRAMUSCULAR | Status: DC | PRN
  Administered 2013-10-29: 0.6 mg via INTRAVENOUS

## 2013-10-29 MED ORDER — SUCCINYLCHOLINE CHLORIDE 20 MG/ML IJ SOLN
INTRAMUSCULAR | Status: DC | PRN
  Administered 2013-10-29: 100 mg via INTRAVENOUS

## 2013-10-29 MED ORDER — METHOCARBAMOL 500 MG PO TABS
500.0000 mg | ORAL_TABLET | Freq: Four times a day (QID) | ORAL | Status: DC | PRN
  Administered 2013-10-30 (×2): 500 mg via ORAL
  Filled 2013-10-29 (×2): qty 1

## 2013-10-29 MED ORDER — LIDOCAINE HCL (CARDIAC) 20 MG/ML IV SOLN
INTRAVENOUS | Status: DC | PRN
  Administered 2013-10-29: 80 mg via INTRAVENOUS

## 2013-10-29 MED ORDER — CEFAZOLIN SODIUM-DEXTROSE 2-3 GM-% IV SOLR
2.0000 g | Freq: Four times a day (QID) | INTRAVENOUS | Status: AC
  Administered 2013-10-29 (×2): 2 g via INTRAVENOUS
  Filled 2013-10-29 (×2): qty 50

## 2013-10-29 MED ORDER — PHENOL 1.4 % MT LIQD: 1.0000 | OROMUCOSAL | Status: DC | PRN

## 2013-10-29 MED ORDER — AMLODIPINE BESYLATE 5 MG PO TABS
5.0000 mg | ORAL_TABLET | Freq: Every morning | ORAL | Status: DC
  Filled 2013-10-29: qty 1

## 2013-10-29 MED ORDER — ALUM & MAG HYDROXIDE-SIMETH 200-200-20 MG/5ML PO SUSP: 30.0000 mL | ORAL | Status: DC | PRN

## 2013-10-29 MED ORDER — 0.9 % SODIUM CHLORIDE (POUR BTL) OPTIME
TOPICAL | Status: DC | PRN
  Administered 2013-10-29: 1000 mL

## 2013-10-29 MED ORDER — FERROUS SULFATE 325 (65 FE) MG PO TABS
325.0000 mg | ORAL_TABLET | Freq: Three times a day (TID) | ORAL | Status: DC
  Administered 2013-10-29 – 2013-10-30 (×3): 325 mg via ORAL
  Filled 2013-10-29 (×6): qty 1

## 2013-10-29 MED ORDER — SODIUM CHLORIDE 0.9 % IV SOLN
1000.0000 mg | Freq: Once | INTRAVENOUS | Status: AC
  Administered 2013-10-29: 1000 mg via INTRAVENOUS
  Filled 2013-10-29: qty 10

## 2013-10-29 MED ORDER — VALPROIC ACID 250 MG PO CAPS
250.0000 mg | ORAL_CAPSULE | Freq: Two times a day (BID) | ORAL | Status: DC
  Administered 2013-10-29 – 2013-10-30 (×2): 250 mg via ORAL
  Filled 2013-10-29 (×3): qty 1

## 2013-10-29 MED ORDER — MEPERIDINE HCL 50 MG/ML IJ SOLN: 6.2500 mg | INTRAMUSCULAR | Status: DC | PRN

## 2013-10-29 MED ORDER — NEOSTIGMINE METHYLSULFATE 1 MG/ML IJ SOLN
INTRAMUSCULAR | Status: DC | PRN
  Administered 2013-10-29: 3 mg via INTRAVENOUS

## 2013-10-29 MED ORDER — DEXAMETHASONE SODIUM PHOSPHATE 10 MG/ML IJ SOLN
10.0000 mg | Freq: Once | INTRAMUSCULAR | Status: AC
  Administered 2013-10-30: 10 mg via INTRAVENOUS
  Filled 2013-10-29: qty 1

## 2013-10-29 MED ORDER — LEVOTHYROXINE SODIUM 25 MCG PO TABS
25.0000 ug | ORAL_TABLET | Freq: Every day | ORAL | Status: DC
  Administered 2013-10-30: 25 ug via ORAL
  Filled 2013-10-29 (×2): qty 1

## 2013-10-29 MED ORDER — OXYBUTYNIN CHLORIDE 5 MG PO TABS
5.0000 mg | ORAL_TABLET | Freq: Two times a day (BID) | ORAL | Status: DC
  Administered 2013-10-29 – 2013-10-30 (×2): 5 mg via ORAL
  Filled 2013-10-29 (×3): qty 1

## 2013-10-29 MED ORDER — MENTHOL 3 MG MT LOZG: 1.0000 | LOZENGE | OROMUCOSAL | Status: DC | PRN

## 2013-10-29 MED ORDER — ONDANSETRON HCL 4 MG PO TABS: 4.0000 mg | ORAL_TABLET | Freq: Four times a day (QID) | ORAL | Status: DC | PRN

## 2013-10-29 MED ORDER — MIDAZOLAM HCL 2 MG/2ML IJ SOLN
INTRAMUSCULAR | Status: AC
  Filled 2013-10-29: qty 2

## 2013-10-29 MED ORDER — MIDAZOLAM HCL 5 MG/5ML IJ SOLN
INTRAMUSCULAR | Status: DC | PRN
  Administered 2013-10-29: 2 mg via INTRAVENOUS

## 2013-10-29 MED ORDER — HYDROMORPHONE HCL PF 1 MG/ML IJ SOLN
INTRAMUSCULAR | Status: DC | PRN
  Administered 2013-10-29 (×2): 1 mg via INTRAVENOUS

## 2013-10-29 MED ORDER — LACTATED RINGERS IV SOLN
INTRAVENOUS | Status: DC | PRN
  Administered 2013-10-29 (×2): via INTRAVENOUS

## 2013-10-29 MED ORDER — DIPHENHYDRAMINE HCL 12.5 MG/5ML PO ELIX: 25.0000 mg | ORAL_SOLUTION | Freq: Four times a day (QID) | ORAL | Status: DC | PRN

## 2013-10-29 MED ORDER — DEXAMETHASONE SODIUM PHOSPHATE 10 MG/ML IJ SOLN
10.0000 mg | Freq: Once | INTRAMUSCULAR | Status: AC
  Administered 2013-10-29: 10 mg via INTRAVENOUS

## 2013-10-29 MED ORDER — HYDROMORPHONE HCL PF 1 MG/ML IJ SOLN
INTRAMUSCULAR | Status: AC
  Filled 2013-10-29: qty 1

## 2013-10-29 MED ORDER — HYDROCODONE-ACETAMINOPHEN 7.5-325 MG PO TABS
1.0000 | ORAL_TABLET | ORAL | Status: DC
  Administered 2013-10-29 (×2): 1 via ORAL
  Administered 2013-10-29 – 2013-10-30 (×5): 2 via ORAL
  Filled 2013-10-29: qty 1
  Filled 2013-10-29 (×2): qty 2
  Filled 2013-10-29: qty 1
  Filled 2013-10-29 (×3): qty 2

## 2013-10-29 MED ORDER — LOSARTAN POTASSIUM 50 MG PO TABS
100.0000 mg | ORAL_TABLET | Freq: Every morning | ORAL | Status: DC
  Administered 2013-10-29: 100 mg via ORAL
  Filled 2013-10-29 (×2): qty 2

## 2013-10-29 MED ORDER — DOCUSATE SODIUM 100 MG PO CAPS
100.0000 mg | ORAL_CAPSULE | Freq: Two times a day (BID) | ORAL | Status: DC
  Administered 2013-10-29 – 2013-10-30 (×2): 100 mg via ORAL

## 2013-10-29 MED ORDER — FENTANYL CITRATE 0.05 MG/ML IJ SOLN
INTRAMUSCULAR | Status: DC | PRN
  Administered 2013-10-29: 100 ug via INTRAVENOUS
  Administered 2013-10-29 (×2): 50 ug via INTRAVENOUS

## 2013-10-29 MED ORDER — CEFAZOLIN SODIUM-DEXTROSE 2-3 GM-% IV SOLR
2.0000 g | INTRAVENOUS | Status: AC
  Administered 2013-10-29: 2 g via INTRAVENOUS

## 2013-10-29 MED ORDER — PROMETHAZINE HCL 25 MG/ML IJ SOLN: 6.2500 mg | INTRAMUSCULAR | Status: DC | PRN

## 2013-10-29 MED ORDER — ONDANSETRON HCL 4 MG/2ML IJ SOLN
4.0000 mg | Freq: Four times a day (QID) | INTRAMUSCULAR | Status: DC | PRN
Start: 2013-10-29 — End: 2013-10-30

## 2013-10-29 MED ORDER — CHLORHEXIDINE GLUCONATE 4 % EX LIQD: 60.0000 mL | Freq: Once | CUTANEOUS | Status: DC

## 2013-10-29 MED ORDER — CEFAZOLIN SODIUM-DEXTROSE 2-3 GM-% IV SOLR
INTRAVENOUS | Status: AC
  Filled 2013-10-29: qty 50

## 2013-10-29 MED ORDER — POLYETHYLENE GLYCOL 3350 17 G PO PACK: 17.0000 g | PACK | Freq: Every day | ORAL | Status: DC | PRN

## 2013-10-29 MED ORDER — PROPOFOL 10 MG/ML IV BOLUS
INTRAVENOUS | Status: AC
  Filled 2013-10-29: qty 20

## 2013-10-29 MED ORDER — ONDANSETRON HCL 4 MG/2ML IJ SOLN
INTRAMUSCULAR | Status: DC | PRN
  Administered 2013-10-29: 4 mg via INTRAVENOUS

## 2013-10-29 MED ORDER — PANTOPRAZOLE SODIUM 40 MG PO TBEC
40.0000 mg | DELAYED_RELEASE_TABLET | Freq: Every day | ORAL | Status: DC
Start: 2013-10-29 — End: 2013-10-30
  Administered 2013-10-29: 40 mg via ORAL
  Filled 2013-10-29 (×2): qty 1

## 2013-10-29 MED ORDER — METHOCARBAMOL 100 MG/ML IJ SOLN
500.0000 mg | Freq: Four times a day (QID) | INTRAVENOUS | Status: DC | PRN
  Administered 2013-10-29: 500 mg via INTRAVENOUS
  Filled 2013-10-29: qty 5

## 2013-10-29 MED ORDER — FUROSEMIDE 20 MG PO TABS
20.0000 mg | ORAL_TABLET | Freq: Every day | ORAL | Status: DC
Start: 2013-10-29 — End: 2013-10-30
  Administered 2013-10-29: 20 mg via ORAL
  Filled 2013-10-29 (×2): qty 1

## 2013-10-29 MED ORDER — OXYCODONE HCL 5 MG/5ML PO SOLN
5.0000 mg | Freq: Once | ORAL | Status: DC | PRN
  Filled 2013-10-29: qty 5

## 2013-10-29 MED ORDER — FUROSEMIDE 40 MG PO TABS
40.0000 mg | ORAL_TABLET | Freq: Two times a day (BID) | ORAL | Status: DC
Start: 2013-10-29 — End: 2013-10-29

## 2013-10-29 SURGICAL SUPPLY — 47 items
ADH SKN CLS APL DERMABOND .7 (GAUZE/BANDAGES/DRESSINGS) ×1
BAG SPEC THK2 15X12 ZIP CLS (MISCELLANEOUS)
BAG ZIPLOCK 12X15 (MISCELLANEOUS) IMPLANT
BLADE SAW SGTL 18X1.27X75 (BLADE) IMPLANT
BLADE SAW SGTL 18X1.27X75MM (BLADE)
CAPT HIP PF COP ×3 IMPLANT
DERMABOND ADVANCED (GAUZE/BANDAGES/DRESSINGS) ×2
DERMABOND ADVANCED .7 DNX12 (GAUZE/BANDAGES/DRESSINGS) ×1 IMPLANT
DRAPE C-ARM 42X120 X-RAY (DRAPES) ×3 IMPLANT
DRAPE STERI IOBAN 125X83 (DRAPES) ×3 IMPLANT
DRAPE U-SHAPE 47X51 STRL (DRAPES) ×9 IMPLANT
DRSG AQUACEL AG ADV 3.5X10 (GAUZE/BANDAGES/DRESSINGS) ×3 IMPLANT
DRSG TEGADERM 4X4.75 (GAUZE/BANDAGES/DRESSINGS) IMPLANT
DURAPREP 26ML APPLICATOR (WOUND CARE) ×3 IMPLANT
ELECT BLADE TIP CTD 4 INCH (ELECTRODE) ×3 IMPLANT
ELECT REM PT RETURN 9FT ADLT (ELECTROSURGICAL) ×3
ELECTRODE REM PT RTRN 9FT ADLT (ELECTROSURGICAL) ×1 IMPLANT
EVACUATOR 1/8 PVC DRAIN (DRAIN) IMPLANT
FACESHIELD WRAPAROUND (MASK) ×12 IMPLANT
GAUZE SPONGE 2X2 8PLY STRL LF (GAUZE/BANDAGES/DRESSINGS) IMPLANT
GLOVE BIO SURGEON STRL SZ7 (GLOVE) ×3 IMPLANT
GLOVE BIO SURGEON STRL SZ7.5 (GLOVE) ×3 IMPLANT
GLOVE BIO SURGEON STRL SZ8 (GLOVE) ×9 IMPLANT
GLOVE BIOGEL PI IND STRL 7.0 (GLOVE) ×1 IMPLANT
GLOVE BIOGEL PI IND STRL 7.5 (GLOVE) ×1 IMPLANT
GLOVE BIOGEL PI IND STRL 8 (GLOVE) ×2 IMPLANT
GLOVE BIOGEL PI INDICATOR 7.0 (GLOVE) ×2
GLOVE BIOGEL PI INDICATOR 7.5 (GLOVE) ×2
GLOVE BIOGEL PI INDICATOR 8 (GLOVE) ×4
GLOVE ECLIPSE 8.0 STRL XLNG CF (GLOVE) IMPLANT
GLOVE ORTHO TXT STRL SZ7.5 (GLOVE) ×6 IMPLANT
GOWN SPEC L3 XXLG W/TWL (GOWN DISPOSABLE) IMPLANT
GOWN SPEC L4 XLG W/TWL (GOWN DISPOSABLE) ×9 IMPLANT
GOWN STRL REUS W/TWL LRG LVL3 (GOWN DISPOSABLE) ×6 IMPLANT
KIT BASIN OR (CUSTOM PROCEDURE TRAY) ×3 IMPLANT
PACK TOTAL JOINT (CUSTOM PROCEDURE TRAY) ×3 IMPLANT
PADDING CAST COTTON 6X4 STRL (CAST SUPPLIES) ×3 IMPLANT
SAW OSC TIP CART 19.5X105X1.3 (SAW) ×3 IMPLANT
SPONGE GAUZE 2X2 STER 10/PKG (GAUZE/BANDAGES/DRESSINGS)
SUT MNCRL AB 4-0 PS2 18 (SUTURE) ×3 IMPLANT
SUT VIC AB 1 CT1 36 (SUTURE) ×9 IMPLANT
SUT VIC AB 2-0 CT1 27 (SUTURE) ×6
SUT VIC AB 2-0 CT1 TAPERPNT 27 (SUTURE) ×2 IMPLANT
SUT VLOC 180 0 24IN GS25 (SUTURE) ×3 IMPLANT
TOWEL OR 17X26 10 PK STRL BLUE (TOWEL DISPOSABLE) ×3 IMPLANT
TOWEL OR NON WOVEN STRL DISP B (DISPOSABLE) ×3 IMPLANT
TRAY FOLEY CATH 14FRSI W/METER (CATHETERS) ×3 IMPLANT

## 2013-10-29 NOTE — Progress Notes (Signed)
PHARMACY BRIEF NOTE:    DRUG INTERACTION  Orders to continue simvastatin 40 mg daily and amlodipine 5 mg daily noted. Because this combination carries an increased risk of myopathy, the recommended maximum dosage of simvastatin is 20 mg daily in patients on concomitant amlodipine.  Per [facility], atorvastatin 20 mg daily is being used for simvastatin 40 mg daily to avoid this drug interaction.  Clayburn Pert, PharmD, BCPS Pager: [phone] 10/29/2013  12:38 PM

## 2013-10-29 NOTE — Interval H&P Note (Signed)
History and Physical Interval Note:  10/29/2013 6:33 AM  Lalla Brothers  has presented today for surgery, with the diagnosis of RIGHT HIP OA  The various methods of treatment have been discussed with the patient and family. After consideration of risks, benefits and other options for treatment, the patient has consented to  Procedure(s): RIGHT TOTAL HIP ARTHROPLASTY ANTERIOR APPROACH (Right) as a surgical intervention .  The patient's history has been reviewed, patient examined, no change in status, stable for surgery.  I have reviewed the patient's chart and labs.  Questions were answered to the patient's satisfaction.     Mauri Pole

## 2013-10-29 NOTE — Op Note (Signed)
NAME:  [name] [name]                ACCOUNT NO.: [id number]      MEDICAL RECORD NO.: [id number]      FACILITY:  [facility]      PHYSICIAN:  Paralee Cancel D  DATE OF BIRTH:  October 21, 1940     DATE OF PROCEDURE:  10/29/2013                                 OPERATIVE REPORT         PREOPERATIVE DIAGNOSIS: Right  hip osteoarthritis.      POSTOPERATIVE DIAGNOSIS:  Right hip osteoarthritis.      PROCEDURE:  Right total hip replacement through an anterior approach   utilizing DePuy THR system, component size 23mm pinnacle cup, a size 32+4 neutral   Altrex liner, a size 5 Hi Tri Lock stem with a 32+1 delta ceramic   ball.      SURGEON:  Pietro Cassis. Alvan Dame, M.D.      ASSISTANT:  Molli Barrows, PA-C     ANESTHESIA:  General.      SPECIMENS:  None.      COMPLICATIONS:  None.      BLOOD LOSS:  850cc     DRAINS:  None     INDICATION OF THE PROCEDURE:  [name] [name] is a 73 y.o. female who had   presented to office for evaluation of right hip pain.  Radiographs revealed   progressive degenerative changes with bone-on-bone   articulation to the  hip joint.  The patient had painful limited range of   motion significantly affecting their overall quality of life.  The patient was failing to    respond to conservative measures, and at this point was ready   to proceed with more definitive measures.  The patient has noted progressive   degenerative changes in his hip, progressive problems and dysfunction   with regarding the hip prior to surgery.  Consent was obtained for   benefit of pain relief.  Specific risk of infection, DVT, component   failure, dislocation, need for revision surgery, as well discussion of   the anterior versus posterior approach were reviewed.  Consent was   obtained for benefit of anterior pain relief through an anterior   approach.      PROCEDURE IN DETAIL:  The patient was brought to operative theater.   Once adequate anesthesia, preoperative  antibiotics, 2gm of  Ancef administered.   The patient was positioned supine on the OSI Hanna table.  Once adequate   padding of boney process was carried out, we had predraped out the hip, and  used fluoroscopy to confirm orientation of the pelvis and position.      The right hip was then prepped and draped from proximal iliac crest to   mid thigh with shower curtain technique.      Time-out was performed identifying the patient, planned procedure, and   extremity.     An incision was then made 2 cm distal and lateral to the   anterior superior iliac spine extending over the orientation of the   tensor fascia lata muscle and sharp dissection was carried down to the   fascia of the muscle and protractor placed in the soft tissues.      The fascia was then incised.  The muscle belly was identified and swept   laterally  and retractor placed along the superior neck.  Following   cauterization of the circumflex vessels and removing some pericapsular   fat, a second cobra retractor was placed on the inferior neck.  A third   retractor was placed on the anterior acetabulum after elevating the   anterior rectus.  A L-capsulotomy was along the line of the   superior neck to the trochanteric fossa, then extended proximally and   distally.  Tag sutures were placed and the retractors were then placed   intracapsular.  We then identified the trochanteric fossa and   orientation of my neck cut, confirmed this radiographically   and then made a neck osteotomy with the femur on traction.  The femoral   head was removed without difficulty or complication.  Traction was let   off and retractors were placed posterior and anterior around the   acetabulum.      The labrum and foveal tissue were debrided.  I began reaming with a 9mm   reamer and reamed up to 8mm reamer with good bony bed preparation and a 50   cup was chosen.  The final 74mm Pinnacle cup was then impacted under fluoroscopy  to confirm  the depth of penetration and orientation with respect to   abduction.  A screw was placed followed by the hole eliminator.  The final   32+4 neitral Altrex liner was impacted with good visualized rim fit.  The cup was positioned anatomically within the acetabular portion of the pelvis.      At this point, the femur was rolled at 80 degrees.  Further capsule was   released off the inferior aspect of the femoral neck.  I then   released the superior capsule proximally.  The hook was placed laterally   along the femur and elevated manually and held in position with the bed   hook.  The leg was then extended and adducted with the leg rolled to 100   degrees of external rotation.  Once the proximal femur was fully   exposed, I used a box osteotome to set orientation.  I then began   broaching with the starting chili pepper broach and passed this by hand and then broached up to 5.  With the 5 broach in place I chose a high offset neck and did a trial reduction.  The offset was appropriate, leg lengths   appeared to be equal, confirmed radiographically to match her contralateral hip done previously.   Given these findings, I went ahead and dislocated the hip, repositioned all   retractors and positioned the right hip in the extended and abducted position.  The final 5 Hi Tri Lock stem was   chosen and it was impacted down to the level of neck cut.  Based on this   and the trial reduction, a 32+1 delta ceramic ball was chosen and   impacted onto a clean and dry trunnion, and the hip was reduced.  The   hip had been irrigated throughout the case again at this point.  I did   reapproximate the superior capsular leaflet to the anterior leaflet   using #1 Vicryl.  The fascia of the   tensor fascia lata muscle was then reapproximated using #1 Vicryl and #0 V-lock sutures.  The   remaining wound was closed with 2-0 Vicryl and running 4-0 Monocryl.   The hip was cleaned, dried, and dressed sterilely using  Dermabond and   Aquacel dressing.  She was then  brought   to recovery room in stable condition tolerating the procedure well.    Molli Barrows, PA-C was present for the entirety of the case involved from   preoperative positioning, perioperative retractor management, general   facilitation of the case, as well as primary wound closure as assistant.            Pietro Cassis Alvan Dame, M.D.        10/29/2013 9:18 AM

## 2013-10-29 NOTE — Anesthesia Postprocedure Evaluation (Signed)
Anesthesia Post Note  Patient: [name] [name]  Procedure(s) Performed: Procedure(s) (LRB): RIGHT TOTAL HIP ARTHROPLASTY ANTERIOR APPROACH (Right)  Anesthesia type: General  Patient location: PACU  Post pain: Pain level controlled  Post assessment: Post-op Vital signs reviewed  Last Vitals: BP 142/69  Pulse 99  Temp(Src) 36.7 C (Oral)  Resp 16  SpO2 96%  Post vital signs: Reviewed  Level of consciousness: sedated  Complications: No apparent anesthesia complications

## 2013-10-29 NOTE — Progress Notes (Signed)
Utilization review completed.  

## 2013-10-29 NOTE — Anesthesia Preprocedure Evaluation (Signed)
Anesthesia Evaluation  Patient identified by MRN, date of birth, ID band Patient awake    Reviewed: Allergy & Precautions, H&P , NPO status , Patient's Chart, lab work & pertinent test results  Airway Mallampati: II TM Distance: >3 FB Neck ROM: Full    Dental  (+) Dental Advisory Given   Pulmonary neg pulmonary ROS,          Cardiovascular hypertension, Pt. on medications Rhythm:Regular Rate:Normal     Neuro/Psych  Headaches, negative psych ROS   GI/Hepatic negative GI ROS, Neg liver ROS, GERD-  Medicated,  Endo/Other  Hypothyroidism   Renal/GU negative Renal ROS     Musculoskeletal negative musculoskeletal ROS (+)   Abdominal (+) + obese,   Peds  Hematology negative hematology ROS (+)   Anesthesia Other Findings   Reproductive/Obstetrics negative OB ROS                           Anesthesia Physical Anesthesia Plan  ASA: III  Anesthesia Plan: Spinal   Post-op Pain Management:    Induction:   Airway Management Planned:   Additional Equipment:   Intra-op Plan:   Post-operative Plan:   Informed Consent: I have reviewed the patients History and Physical, chart, labs and discussed the procedure including the risks, benefits and alternatives for the proposed anesthesia with the patient or authorized representative who has indicated his/her understanding and acceptance.   Dental advisory given  Plan Discussed with: CRNA  Anesthesia Plan Comments:         Anesthesia Quick Evaluation

## 2013-10-29 NOTE — Transfer of Care (Signed)
Immediate Anesthesia Transfer of Care Note  Patient: [name] [name]  Procedure(s) Performed: Procedure(s): RIGHT TOTAL HIP ARTHROPLASTY ANTERIOR APPROACH (Right)  Patient Location: PACU  Anesthesia Type:General  Level of Consciousness: awake and alert   Airway & Oxygen Therapy: Patient Spontanous Breathing and Patient connected to face mask oxygen  Post-op Assessment: Report given to PACU RN and Post -op Vital signs reviewed and stable  Post vital signs: Reviewed and stable  Complications: No apparent anesthesia complications

## 2013-10-29 NOTE — Evaluation (Signed)
Physical Therapy Evaluation Patient Details Name: [name] [name] MRN: [id number] DOB: July 03, 1941 Today's Date: 10/29/2013   History of Present Illness  Pt s/p R THA direct anterior. She has a L THA in 2012 as well.   Clinical Impression  Pt present s/o R THA direct anterior approach with some pain and decreased mobility. Pt to benefit from further PT to return home safely with assistance of family at min/ supervision level and continue until at independent level at home.     Follow Up Recommendations Home health PT    Equipment Recommendations  None recommended by PT    Recommendations for Other Services       Precautions / Restrictions Precautions Precautions: None Precaution Comments: direct anteriro with no precautions Restrictions Weight Bearing Restrictions: No (WBAT RLE)      Mobility  Bed Mobility Overal bed mobility: Needs Assistance Bed Mobility: Supine to Sit;Sit to Supine     Supine to sit: Mod assist     General bed mobility comments: for LEs and upper body  Transfers Overall transfer level: Needs assistance Equipment used: Rolling walker (2 wheeled) Transfers: Sit to/from Stand Sit to Stand: Mod assist         General transfer comment: cues for hand placement and safety with RW   Ambulation/Gait Ambulation/Gait assistance: Min assist Ambulation Distance (Feet): 5 Feet Assistive device: Rolling walker (2 wheeled) Gait Pattern/deviations: Step-to pattern Gait velocity: slow   General Gait Details: needed some assistance initially with progressing RLE , but less assistnce needed by the 5th step.   Stairs            Wheelchair Mobility    Modified Rankin (Stroke Patients Only)       Balance                                             Pertinent Vitals/Pain Pt reported anterior and lateral R hip pain at 6/10. Meds given prior to session and ice applied after.     Home Living Family/patient expects to be discharged  to:: Private residence Living Arrangements: Children Available Help at Discharge: Family Type of Home: [place]: [place]: Environmental consultant - 2 wheels;Hospital bed;Wheelchair - manual;Cane - single point      Prior Function Level of Independence: Independent with assistive device(s)         Comments: her mobility had been getting worse since Thanksgiving and occassionaly was having to use a cane or RW to assist.      Hand Dominance        Extremity/Trunk Assessment               Lower Extremity Assessment: RLE deficits/detail RLE Deficits / Details: difficulty with r hip flex/ext due to pain, garding and stiffness p/o       Communication   Communication: No difficulties  Cognition Arousal/Alertness: Awake/alert Behavior During Therapy: WFL for tasks assessed/performed                        General Comments      Exercises Total Joint Exercises Ankle Circles/Pumps: AROM;Both;Supine Quad Sets: Right;AROM;5 reps;Supine Heel Slides: AAROM;Right;5 reps;Supine Hip ABduction/ADduction: AAROM;5 reps;Supine;Right      Assessment/Plan    PT Assessment Patient needs continued PT services  PT Diagnosis Difficulty walking  PT Problem List Decreased strength;Decreased range of motion;Decreased activity tolerance;Decreased mobility  PT Treatment Interventions DME instruction;Gait training;Functional mobility training;Therapeutic activities;Therapeutic exercise;Patient/family education   PT Goals (Current goals can be found in the Care Plan section) Acute Rehab PT Goals Patient Stated Goal: I want to be able to go back home and not have to use any cane, and just be able to walk again!!  PT Goal Formulation: With patient Time For Goal Achievement: 11/05/13 Potential to Achieve Goals: Good    Frequency 7X/week   Barriers to discharge        Co-evaluation               End of Session Equipment  Utilized During Treatment: Gait belt Activity Tolerance: Patient tolerated treatment well Patient left: in chair;with call bell/phone within reach;with family/visitor present Nurse Communication: Mobility status         Time: 0086-7619 PT Time Calculation (min): 27 min   Charges:   PT Evaluation $Initial PT Evaluation Tier I: 1 Procedure PT Treatments $Gait Training: 8-22 mins $Therapeutic Activity: 8-22 mins   PT G CodesClide Dales 10/29/2013, 6:10 PM Clide Dales, PT Pager: [phone] 10/29/2013

## 2013-10-30 ENCOUNTER — Encounter (HOSPITAL_COMMUNITY): Payer: Self-pay | Admitting: Orthopedic Surgery

## 2013-10-30 DIAGNOSIS — D500 Iron deficiency anemia secondary to blood loss (chronic): Secondary | ICD-10-CM | POA: Diagnosis not present

## 2013-10-30 DIAGNOSIS — D649 Anemia, unspecified: Secondary | ICD-10-CM | POA: Diagnosis not present

## 2013-10-30 LAB — CBC
HEMATOCRIT: 28.6 % — AB (ref 36.0–46.0)
Hemoglobin: 9.4 g/dL — ABNORMAL LOW (ref 12.0–15.0)
MCH: 29.5 pg (ref 26.0–34.0)
MCHC: 32.9 g/dL (ref 30.0–36.0)
MCV: 89.7 fL (ref 78.0–100.0)
Platelets: 185 10*3/uL (ref 150–400)
RBC: 3.19 MIL/uL — ABNORMAL LOW (ref 3.87–5.11)
RDW: 13.3 % (ref 11.5–15.5)
WBC: 12.2 10*3/uL — AB (ref 4.0–10.5)

## 2013-10-30 LAB — BASIC METABOLIC PANEL
BUN: 17 mg/dL (ref 6–23)
CALCIUM: 9.8 mg/dL (ref 8.4–10.5)
CHLORIDE: 102 meq/L (ref 96–112)
CO2: 29 meq/L (ref 19–32)
CREATININE: 0.75 mg/dL (ref 0.50–1.10)
GFR calc non Af Amer: 83 mL/min — ABNORMAL LOW (ref >90)
Glucose, Bld: 129 mg/dL — ABNORMAL HIGH (ref 70–99)
Potassium: 4.8 mEq/L (ref 3.7–5.3)
Sodium: 141 mEq/L (ref 137–147)

## 2013-10-30 MED ORDER — TIZANIDINE HCL 4 MG PO CAPS: 4.0000 mg | ORAL_CAPSULE | Freq: Three times a day (TID) | ORAL | Status: DC | PRN

## 2013-10-30 MED ORDER — DSS 100 MG PO CAPS: 100.0000 mg | ORAL_CAPSULE | Freq: Two times a day (BID) | ORAL | Status: DC

## 2013-10-30 MED ORDER — ASPIRIN 325 MG PO TBEC: 325.0000 mg | DELAYED_RELEASE_TABLET | Freq: Two times a day (BID) | ORAL | Status: AC

## 2013-10-30 MED ORDER — HYDROCODONE-ACETAMINOPHEN 7.5-325 MG PO TABS: 1.0000 | ORAL_TABLET | ORAL | Status: DC | PRN

## 2013-10-30 MED ORDER — POLYETHYLENE GLYCOL 3350 17 G PO PACK: 17.0000 g | PACK | Freq: Every day | ORAL | Status: DC | PRN

## 2013-10-30 MED ORDER — FERROUS SULFATE 325 (65 FE) MG PO TABS: 325.0000 mg | ORAL_TABLET | Freq: Three times a day (TID) | ORAL | Status: DC

## 2013-10-30 NOTE — Progress Notes (Signed)
[facility]   [facility] is providing the following services: patient has both rw and commode at home.   If patient discharges after hours, please call [phone].   Linward Headland 10/30/2013, 10:51 AM

## 2013-10-30 NOTE — Progress Notes (Signed)
CSW consulted for SNF placement. PN reviewed. PT is recommending HHPT following hospital d/c. RNCM will assist with d/c planning needs.  Reba Hulett LCSW [phone] 

## 2013-10-30 NOTE — Progress Notes (Signed)
Physical Therapy Treatment Patient Details Name: [name] [name] MRN: [id number] DOB: Jan 24, 1941 Today's Date: 10/30/2013    History of Present Illness Pt s/p R THA direct anterior. She has a L THA in 2012 as well.     PT Comments    Marked improvement in activity tolerance from yesterday.  Pt hoping for d/c home this pm  Follow Up Recommendations  Home health PT     Equipment Recommendations  None recommended by PT    Recommendations for Other Services OT consult     Precautions / Restrictions Precautions Precautions: None Precaution Comments: direct anteriro with no precautions Restrictions Weight Bearing Restrictions: No Other Position/Activity Restrictions: WBAT    Mobility  Bed Mobility Overal bed mobility: Needs Assistance Bed Mobility: Supine to Sit     Supine to sit: Min assist     General bed mobility comments: for LEs and upper body  Transfers Overall transfer level: Needs assistance Equipment used: Rolling walker (2 wheeled) Transfers: Sit to/from Stand Sit to Stand: Min assist;Mod assist         General transfer comment: mod from recliner  min from 3:1  Ambulation/Gait Ambulation/Gait assistance: Min assist Ambulation Distance (Feet): 40 Feet Assistive device: Rolling walker (2 wheeled) Gait Pattern/deviations: Step-to pattern;Decreased step length - right;Decreased step length - left;Shuffle;Trunk flexed;Antalgic Gait velocity: slow   General Gait Details: cues for sequence, posture, position from RW adn ER on L   Stairs            Wheelchair Mobility    Modified Rankin (Stroke Patients Only)       Balance                                    Cognition Arousal/Alertness: Awake/alert Behavior During Therapy: WFL for tasks assessed/performed Overall Cognitive Status: Within Functional Limits for tasks assessed                      Exercises Total Joint Exercises Ankle Circles/Pumps: AROM;Both;Supine;15  reps Quad Sets: Right;AROM;5 reps;Supine Heel Slides: AAROM;Right;Supine;20 reps Hip ABduction/ADduction: AAROM;Supine;Right;15 reps    General Comments        Pertinent Vitals/Pain 3/10; premed, ice packs provided    Home Living Family/patient expects to be discharged to:: Private residence Living Arrangements: Children Available Help at Discharge: Family Type of Home: House Home Access: [place]: [place]: Environmental consultant - 2 wheels;Hospital bed;Wheelchair - manual;Cane - single point;Bedside commode;Tub bench;Toilet riser      Prior Function Level of Independence: Independent with assistive device(s)          PT Goals (current goals can now be found in the care plan section) Acute Rehab PT Goals Patient Stated Goal: I want to be able to go back home and not have to use any cane, and just be able to walk again!!  PT Goal Formulation: With patient Time For Goal Achievement: 11/05/13 Potential to Achieve Goals: Good Progress towards PT goals: Progressing toward goals    Frequency  7X/week    PT Plan Current plan remains appropriate    Co-evaluation             End of Session Equipment Utilized During Treatment: Gait belt Activity Tolerance: Patient tolerated treatment well Patient left: in chair;with call bell/phone within reach;with family/visitor present     Time: 0865-7846 PT Time Calculation (min): 31 min  Charges:  $  Gait Training: 8-22 mins $Therapeutic Exercise: 8-22 mins                    G Codes:      Mathis Fare November 10, 2013, 12:04 PM

## 2013-10-30 NOTE — Care Management Note (Signed)
    Page 1 of 1   10/30/2013     10:55:37 AM   CARE MANAGEMENT NOTE 10/30/2013  Patient:  [name],[name]   Account Number:  [id number]  Date Initiated:  10/30/2013  Documentation initiated by:  [facility]  Subjective/Objective Assessment:   73 year old female admitted s/p Right total hip replacement through an anterior approach.     Action/Plan:   HH services at d/c. Daughter will assist with care.   Anticipated DC Date:  10/30/2013   Anticipated DC Plan:  [place]  CM consult      Choice offered to / List presented to:  C-1 Patient        [place] arranged  [facility]      [place].   Status of service:  Completed, signed off Medicare Important Message given?   (If response is "NO", the following Medicare IM given date fields will be blank) Date Medicare IM given:   Date Additional Medicare IM given:    Discharge Disposition:  [place]  Per UR Regulation:  Reviewed for med. necessity/level of care/duration of stay  If discussed at [employer] of Stay Meetings, dates discussed:    Comments:

## 2013-10-30 NOTE — Evaluation (Signed)
Occupational Therapy Evaluation Patient Details Name: [name] [name] MRN: [id number] DOB: September 01, 1940 Today's Date: 10/30/2013    History of Present Illness Pt s/p R THA direct anterior. She has a L THA in 2012 as well.    Clinical Impression   Pt was admitted for the above surgery.  All education was completed.  Pt does not need any further OT at this time.  She has DME/AE at home as well as assistance prn.      Follow Up Recommendations  No OT follow up    Equipment Recommendations  None recommended by OT    Recommendations for Other Services       Precautions / Restrictions Precautions Precautions: None Precaution Comments: direct anteriro with no precautions Restrictions Weight Bearing Restrictions: No      Mobility Bed Mobility                  Transfers   Equipment used: Rolling walker (2 wheeled)   Sit to Stand: Min assist;Mod assist         General transfer comment: mod from recliner  min from 3:1    Balance                                            ADL Overall ADL's : Needs assistance/impaired     Grooming: Wash/dry hands;Supervision/safety;Standing       Lower Body Bathing: Minimal assistance;With adaptive equipment;Sit to/from stand       Lower Body Dressing: Minimal assistance;Sit to/from stand;With adaptive equipment (pants only)   Toilet Transfer: Minimal assistance;Ambulation;BSC   Toileting- Clothing Manipulation and Hygiene: Minimal assistance;Sit to/from stand       Functional mobility during ADLs: Minimal assistance General ADL Comments: pt has a Secondary school teacher at home.  wil have someone get her a long sponge.  Reviewed tub bench transfer.  She has riser and 3:1 for commode.  She plans to place 3:1 over commode     Vision                     Perception     Praxis      Pertinent Vitals/Pain 6/10 with weightbearing:  R hip.  Repositioned and ice applied     Hand Dominance     Extremity/Trunk  Assessment Upper Extremity Assessment Upper Extremity Assessment: Overall WFL for tasks assessed           Communication Communication Communication: No difficulties   Cognition Arousal/Alertness: Awake/alert Behavior During Therapy: WFL for tasks assessed/performed Overall Cognitive Status: Within Functional Limits for tasks assessed                     General Comments       Exercises       Shoulder Instructions      Home Living Family/patient expects to be discharged to:: Private residence Living Arrangements: Children Available Help at Discharge: Family Type of Home: House Home Access: Ramped entrance     Home Layout: One level     Bathroom Shower/Tub: Tub/shower unit Shower/tub characteristics: Curtain Biochemist, clinical: Standard     Home Equipment: Environmental consultant - 2 wheels;Hospital bed;Wheelchair - manual;Cane - single point;Bedside commode;Tub bench;Toilet riser          Prior Functioning/Environment Level of Independence: Independent with assistive device(s)  OT Diagnosis:     OT Problem List:     OT Treatment/Interventions:      OT Goals(Current goals can be found in the care plan section)    OT Frequency:     Barriers to D/C:            Co-evaluation              End of Session Equipment Utilized During Treatment: Rolling walker  Activity Tolerance:   Patient left: in chair;with family/visitor present;with call bell/phone within reach   Time: 1002-1030 OT Time Calculation (min): 28 min Charges:  OT General Charges $OT Visit: 1 Procedure OT Evaluation $Initial OT Evaluation Tier I: 1 Procedure OT Treatments $Self Care/Home Management : 8-22 mins G-Codes:    Lesle Chris 2013/11/23, 11:11 AM Lesle Chris, OTR/L [phone] 11/23/2013

## 2013-10-30 NOTE — Progress Notes (Signed)
   Subjective: 1 Day Post-Op Procedure(s) (LRB): RIGHT TOTAL HIP ARTHROPLASTY ANTERIOR APPROACH (Right)   Patient reports pain as mild, pain controlled. No events throughout the night. Ready to be discharged home if she does well with PT and pain controlled.   Objective:   VITALS:   Filed Vitals:   10/30/13  BP: 126/69  Pulse: 60  Temp: 97.6 F (36.4 C)   Resp: 18    Neurovascular intact Dorsiflexion/Plantar flexion intact Incision: dressing C/D/I No cellulitis present Compartment soft  LABS  Recent Labs  10/30/13 0408  HGB 9.4*  HCT 28.6*  WBC 12.2*  PLT 185     Recent Labs  10/30/13 0408  NA 141  K 4.8  BUN 17  CREATININE 0.75  GLUCOSE 129*     Assessment/Plan: 1 Day Post-Op Procedure(s) (LRB): RIGHT TOTAL HIP ARTHROPLASTY ANTERIOR APPROACH (Right) Foley cath d/c'ed Advance diet Up with therapy D/C IV fluids Discharge home with home health Follow up in 2 weeks at [facility]. Follow up with OLIN,Myha Arizpe D in 2 weeks.  Contact information:  [facility] [address] [place] [phone]    Expected ABLA  Treated with iron and will observe  Morbid Obesity (BMI >40)  Estimated body mass index is 42.42 kg/(m^2) as calculated from the following:   Height as of this encounter: 5\' 2"  (1.575 m).   Weight as of this encounter: 105.235 kg (232 lb). Patient also counseled that weight may inhibit the healing process Patient counseled that losing weight will help with future health issues      West Pugh. Trine Fread   PAC  10/30/2013, 9:25 AM

## 2013-10-30 NOTE — Progress Notes (Signed)
Physical Therapy Treatment Patient Details Name: [name] [name] MRN: [id number] DOB: [birth year] Today's Date: 2013-11-24    History of Present Illness Pt s/p R THA direct anterior. She has a L THA in 2012 as well.     PT Comments    Reviewed stairs and car transfers with pt and dtr.  Follow Up Recommendations  Home health PT     Equipment Recommendations  None recommended by PT    Recommendations for Other Services OT consult     Precautions / Restrictions Precautions Precautions: None Precaution Comments: direct anteriro with no precautions Restrictions Weight Bearing Restrictions: No Other Position/Activity Restrictions: WBAT    Mobility  Bed Mobility Overal bed mobility:  (Pt declines to attempt - states comfortable with ability)                Transfers Overall transfer level: Needs assistance Equipment used: Rolling walker (2 wheeled) Transfers: Sit to/from Stand Sit to Stand: Min guard;Supervision            Ambulation/Gait Ambulation/Gait assistance: Min guard;Supervision Ambulation Distance (Feet): 100 Feet (and 75) Assistive device: Rolling walker (2 wheeled) Gait Pattern/deviations: Step-to pattern;Step-through pattern;Decreased step length - right;Decreased step length - left;Trunk flexed;Shuffle Gait velocity: slow   General Gait Details: cues for sequence, posture, position from RW adn ER on L   Stairs Stairs: Yes Stairs assistance: Mod assist Stair Management: One rail Right;Step to pattern;Forwards;With cane Number of Stairs: 4 General stair comments: MIn assist climbing stairs.  Mod assist and ++encouragement for descent - pt not trusting L knee to control  Wheelchair Mobility    Modified Rankin (Stroke Patients Only)       Balance                                    Cognition Arousal/Alertness: Awake/alert Behavior During Therapy: WFL for tasks assessed/performed Overall Cognitive Status: Within Functional  Limits for tasks assessed                      Exercises      General Comments        Pertinent Vitals/Pain 3/10; premed, ice pack provided    Home Living                      Prior Function            PT Goals (current goals can now be found in the care plan section) Acute Rehab PT Goals Patient Stated Goal: I want to be able to go back home and not have to use any cane, and just be able to walk again!!  PT Goal Formulation: With patient Time For Goal Achievement: 11/05/13 Potential to Achieve Goals: Good Progress towards PT goals: Progressing toward goals    Frequency  7X/week    PT Plan Current plan remains appropriate    Co-evaluation             End of Session Equipment Utilized During Treatment: Gait belt Activity Tolerance: Patient tolerated treatment well Patient left: in chair;with call bell/phone within reach;with family/visitor present     Time: 1335-1414 PT Time Calculation (min): 39 min  Charges:  $Gait Training: 23-37 mins $Therapeutic Activity: 8-22 mins                    G Codes:      [name] [name] 11-24-13,  4:51 PM

## 2013-10-31 NOTE — Progress Notes (Signed)
Utilization review completed.  

## 2013-11-04 NOTE — Discharge Summary (Signed)
Physician Discharge Summary  Patient ID: [name] [name] MRN: [id number] DOB/AGE: 72-Apr-1942 72 y.o.  Admit date: 10/29/2013 Discharge date: 10/30/2013   Procedures:  Procedure(s) (LRB): RIGHT TOTAL HIP ARTHROPLASTY ANTERIOR APPROACH (Right)  Attending Physician:  Dr. Paralee Cancel   Admission Diagnoses:   Right hip OA / pain  Discharge Diagnoses:  Principal Problem:   S/P right THA, AA Active Problems:   Expected blood loss anemia   Morbid obesity  Past Medical History  Diagnosis Date  . Breast cancer 1997    Left sided Stage II-  Adjuvant- Chemo AC & Radiation  . Hypertension   . Hypothyroidism   . Headache(784.0)     tx. Valproic acid  . Arthritis     Ostearthritis- hips, knees, fingers    HPI: [name] [name], 73 y.o. female, has a history of pain and functional disability in the right hip(s) due to arthritis and patient has failed non-surgical conservative treatments for greater than 12 weeks to include NSAID's and/or analgesics, use of assistive devices and activity modification. Onset of symptoms was gradual starting 5 months ago with gradually worsening course since that time.The patient noted prior procedures of the hip to include arthroplasty on the left hip on February 01, 2011 per Dr. Alvan Dame. Patient currently rates pain in the right hip at 10 out of 10 with activity. Patient has night pain, worsening of pain with activity and weight bearing, trendelenberg gait, pain that interfers with activities of daily living and pain with passive range of motion. Patient has evidence of periarticular osteophytes and joint space narrowing by imaging studies. This condition presents safety issues increasing the risk of falls. There is no current active infection. Risks, benefits and expectations were discussed with the patient. Risks including but not limited to the risk of anesthesia, blood clots, nerve damage, blood vessel damage, failure of the prosthesis, infection and up to and including death.  Patient understand the risks, benefits and expectations and wishes to proceed with surgery.   PCP: Octavio Graves, DO   Discharged Condition: good  Hospital Course:  Patient underwent the above stated procedure on 10/29/2013. Patient tolerated the procedure well and brought to the recovery room in good condition and subsequently to the floor.  POD #1 BP: 126/69 ; Pulse: 60 ; Temp: 97.6 F (36.4 C) ; Resp: 18  Patient reports pain as mild, pain controlled. No events throughout the night. Ready to be discharged home.  Neurovascular intact, dorsiflexion/plantar flexion intact, incision: dressing C/D/I, no cellulitis present and compartment soft.   LABS  Basename    HGB  9.4  HCT  28.6    Discharge Exam: General appearance: alert, cooperative and no distress Extremities: Homans sign is negative, no sign of DVT, no edema, redness or tenderness in the calves or thighs and no ulcers, gangrene or trophic changes  Disposition:     Home with follow up in 2 weeks   Follow-up Information   Follow up with Mauri Pole, MD. Schedule an appointment as soon as possible for a visit in 2 weeks.   Specialty:  Orthopedic Surgery   Contact information:   [address] [place] [address] [phone]       Discharge Orders   Future Orders Complete By Expires   Call MD / Call 911  As directed    Change dressing  As directed    Constipation Prevention  As directed    Diet - low sodium heart healthy  As directed  Discharge instructions  As directed    Increase activity slowly as tolerated  As directed    TED hose  As directed    Weight bearing as tolerated  As directed    Questions:     Laterality:     Extremity:          Medication List    STOP taking these medications       acetaminophen-codeine 300-30 MG per tablet  Commonly known as:  TYLENOL #3     meloxicam 7.5 MG tablet  Commonly known as:  MOBIC      TAKE these medications       amLODipine 5 MG  tablet  Commonly known as:  NORVASC  Take 5 mg by mouth every morning.     aspirin 325 MG EC tablet  Take 1 tablet (325 mg total) by mouth 2 (two) times daily.     DSS 100 MG Caps  Take 100 mg by mouth 2 (two) times daily.     ferrous sulfate 325 (65 FE) MG tablet  Take 1 tablet (325 mg total) by mouth 3 (three) times daily after meals.     furosemide 40 MG tablet  Commonly known as:  LASIX  Take 40-60 mg by mouth 2 (two) times daily. 1 in the morning and 1/2 tablet at night     HYDROcodone-acetaminophen 7.5-325 MG per tablet  Commonly known as:  NORCO  Take 1-2 tablets by mouth every 4 (four) hours as needed for moderate pain.     levothyroxine 25 MCG tablet  Commonly known as:  SYNTHROID, LEVOTHROID  Take 25 mcg by mouth daily before breakfast.     losartan 100 MG tablet  Commonly known as:  COZAAR  Take 100 mg by mouth every morning.     omeprazole 20 MG capsule  Commonly known as:  PRILOSEC  Take 20 mg by mouth daily. Takes at bedtime     oxybutynin 5 MG tablet  Commonly known as:  DITROPAN  Take 5 mg by mouth 2 (two) times daily.     polyethylene glycol packet  Commonly known as:  MIRALAX / GLYCOLAX  Take 17 g by mouth daily as needed for mild constipation.     simvastatin 40 MG tablet  Commonly known as:  ZOCOR  Take 40 mg by mouth daily.     tiZANidine 4 MG capsule  Commonly known as:  ZANAFLEX  Take 1 capsule (4 mg total) by mouth 3 (three) times daily as needed for muscle spasms.     valproic acid 250 MG capsule  Commonly known as:  DEPAKENE  Take 250 mg by mouth 2 (two) times daily.     Vitamin D-3 5000 UNITS Tabs  Take 5,000 tablets by mouth daily.         Signed: West Pugh. [place]na Cid   PAC  11/04/2013, 9:35 AM

## 2015-11-13 ENCOUNTER — Encounter: Payer: Self-pay | Admitting: Gastroenterology

## 2015-12-30 ENCOUNTER — Ambulatory Visit (AMBULATORY_SURGERY_CENTER): Payer: Self-pay

## 2015-12-30 VITALS — Ht 63.0 in | Wt 242.8 lb

## 2015-12-30 DIAGNOSIS — Z800 Family history of malignant neoplasm of digestive organs: Secondary | ICD-10-CM

## 2015-12-30 NOTE — Progress Notes (Signed)
No allergies to eggs or soy No past problems with anesthesia No home oxygen No diet meds  Has email and internet; registered for emmi 

## 2015-12-31 ENCOUNTER — Encounter: Payer: Self-pay | Admitting: Gastroenterology

## 2016-01-13 ENCOUNTER — Encounter: Payer: Self-pay | Admitting: Gastroenterology

## 2016-01-13 ENCOUNTER — Ambulatory Visit (AMBULATORY_SURGERY_CENTER): Payer: Medicare Other | Admitting: Gastroenterology

## 2016-01-13 VITALS — BP 110/68 | HR 71 | Temp 95.0°F | Resp 10 | Ht 63.0 in | Wt 242.0 lb

## 2016-01-13 DIAGNOSIS — Z800 Family history of malignant neoplasm of digestive organs: Secondary | ICD-10-CM

## 2016-01-13 DIAGNOSIS — Z1211 Encounter for screening for malignant neoplasm of colon: Secondary | ICD-10-CM | POA: Diagnosis not present

## 2016-01-13 DIAGNOSIS — D125 Benign neoplasm of sigmoid colon: Secondary | ICD-10-CM | POA: Diagnosis not present

## 2016-01-13 MED ORDER — SODIUM CHLORIDE 0.9 % IV SOLN: 500.0000 mL | INTRAVENOUS | Status: DC

## 2016-01-13 NOTE — Op Note (Addendum)
[facility] Patient Name: [name] [name] Procedure Date: 01/13/2016 9:15 AM MRN: [id number] Endoscopist: Mallie Mussel L. Loletha Carrow , MD Age: 75 Referring MD:  Date of Birth: 1941-04-30 Gender: Female Account #: [id number] Procedure:                Colonoscopy Indications:              Screening in patient at increased risk: Family                            history of 1st-degree relative (sister) with                            colorectal cancer, This is the patient's first                            colonoscopy Medicines:                Monitored Anesthesia Care Procedure:                Pre-Anesthesia Assessment:                           - Prior to the procedure, a History and Physical                            was performed, and patient medications and                            allergies were reviewed. The patient's tolerance of                            previous anesthesia was also reviewed. The risks                            and benefits of the procedure and the sedation                            options and risks were discussed with the patient.                            All questions were answered, and informed consent                            was obtained. Prior Anticoagulants: The patient has                            taken no previous anticoagulant or antiplatelet                            agents. ASA Grade Assessment: III - A patient with                            severe systemic disease. After reviewing the risks  and benefits, the patient was deemed in                            satisfactory condition to undergo the procedure.                           After obtaining informed consent, the colonoscope                            was passed under direct vision. Throughout the                            procedure, the patient's blood pressure, pulse, and                            oxygen saturations were monitored continuously. The                      Model PCF-H190DL [id number]) scope was introduced                            through the anus and advanced to the the SIGMOID                            COLON. The scope could not be advanced further                            (even after changing to a pediatric colonoscope)                            because the colonoscopy was extremely difficult due                            to a tortuous and redundant sigmoid colon. The                            patient tolerated the procedure fairly well. The                            bowel preparation used was Miralax. The quality of                            the bowel preparation was good. No anatomical                            landmarks were photographed because the exam was                            incomplete (see above). Scope In: 9:23:39 AM Scope Out: 9:46:26 AM Total Procedure Duration: 0 hours 22 minutes 47 seconds  Findings:                 The perianal and digital rectal examinations were  normal.                           A 8 mm polyp was found in the sigmoid colon. The                            polyp was sessile. The polyp was removed with a                            cold snare. Resection and retrieval were complete.                           Multiple medium-mouthed diverticula were found in                            the sigmoid colon. Complications:            No immediate complications. Estimated Blood Loss:     Estimated blood loss was minimal. Impression:               - One 8 mm polyp in the sigmoid colon, removed with                            a cold snare. Resected and retrieved.                           - Diverticulosis in the sigmoid colon. Recommendation:           - Resume previous diet.                           - No aspirin, ibuprofen, naproxen, or other                            non-steroidal anti-inflammatory drugs for 5 days                            after polyp  removal.                           - Await pathology results.                           - No recommendation at this time regarding repeat                            colonoscopy due to pending further studies.                           - Perform a virtual colonoscopy at appointment to                            be scheduled. Seymour Pavlak L. Loletha Carrow, MD 01/13/2016 10:00:13 AM This report has been signed electronically.

## 2016-01-13 NOTE — Patient Instructions (Signed)
Impression/recommendations:  Polyp (handout given) Diverticulosis (handout given) High Fiber diet (handout given)  No aspirin, aspirin containing products or NSAIDS for 5 days. Tylenol only if needed until 6/27 if needed.  YOU HAD AN ENDOSCOPIC PROCEDURE TODAY AT [facility]:   Refer to the procedure report that was given to you for any specific questions about what was found during the examination.  If the procedure report does not answer your questions, please call your gastroenterologist to clarify.  If you requested that your care partner not be given the details of your procedure findings, then the procedure report has been included in a sealed envelope for you to review at your convenience later.  YOU SHOULD EXPECT: Some feelings of bloating in the abdomen. Passage of more gas than usual.  Walking can help get rid of the air that was put into your GI tract during the procedure and reduce the bloating. If you had a lower endoscopy (such as a colonoscopy or flexible sigmoidoscopy) you may notice spotting of blood in your stool or on the toilet paper. If you underwent a bowel prep for your procedure, you may not have a normal bowel movement for a few days.  Please Note:  You might notice some irritation and congestion in your nose or some drainage.  This is from the oxygen used during your procedure.  There is no need for concern and it should clear up in a day or so.  SYMPTOMS TO REPORT IMMEDIATELY:   Following lower endoscopy (colonoscopy or flexible sigmoidoscopy):  Excessive amounts of blood in the stool  Significant tenderness or worsening of abdominal pains  Swelling of the abdomen that is new, acute  Fever of 100F or higher   For urgent or emergent issues, a gastroenterologist can be reached at any hour by calling [phone].   DIET: Your first meal following the procedure should be a small meal and then it is ok to progress to your normal diet. Heavy or  fried foods are harder to digest and may make you feel nauseous or bloated.  Likewise, meals heavy in dairy and vegetables can increase bloating.  Drink plenty of fluids but you should avoid alcoholic beverages for 24 hours.  ACTIVITY:  You should plan to take it easy for the rest of today and you should NOT DRIVE or use heavy machinery until tomorrow (because of the sedation medicines used during the test).    FOLLOW UP: Our staff will call the number listed on your records the next business day following your procedure to check on you and address any questions or concerns that you may have regarding the information given to you following your procedure. If we do not reach you, we will leave a message.  However, if you are feeling well and you are not experiencing any problems, there is no need to return our call.  We will assume that you have returned to your regular daily activities without incident.  If any biopsies were taken you will be contacted by phone or by letter within the next 1-3 weeks.  Please call us at [phone] if you have not heard about the biopsies in 3 weeks.    SIGNATURES/CONFIDENTIALITY: You and/or your care partner have signed paperwork which will be entered into your electronic medical record.  These signatures attest to the fact that that the information above on your After Visit Summary has been reviewed and is understood.  Full responsibility of the confidentiality of this  discharge information lies with you and/or your care-partner.

## 2016-01-13 NOTE — Progress Notes (Signed)
To recovery, report to [place], RN, VSS 

## 2016-01-13 NOTE — Progress Notes (Signed)
Called to room to assist during endoscopic procedure.  Patient ID and intended procedure confirmed with present staff. Received instructions for my participation in the procedure from the performing physician.  

## 2016-01-14 ENCOUNTER — Other Ambulatory Visit: Payer: Self-pay | Admitting: *Deleted

## 2016-01-14 ENCOUNTER — Telehealth: Payer: Self-pay | Admitting: Gastroenterology

## 2016-01-14 ENCOUNTER — Telehealth: Payer: Self-pay | Admitting: *Deleted

## 2016-01-14 NOTE — Telephone Encounter (Signed)
This patient had an incomplete screening colonoscopy on 01/13/16. Please see whether her insurance will cover a CT colonography ("virtual colonoscopy").  If not, then she needs a barium enema.

## 2016-01-14 NOTE — Telephone Encounter (Signed)
  Follow up Call-  Call back number 01/13/2016  Post procedure Call Back phone  # [phone]  Permission to leave phone message Yes     Patient questions:  Do you have a fever, pain , or abdominal swelling? No. Pain Score  0 *  Have you tolerated food without any problems? Yes.    Have you been able to return to your normal activities? Yes.    Do you have any questions about your discharge instructions: Diet   No. Medications  No. Follow up visit  No.  Do you have questions or concerns about your Care? No.  Actions: * If pain score is 4 or above: No action needed, pain <4.  Patient states that she never has taken haldol, and it was on her record yesterday.

## 2016-01-15 ENCOUNTER — Other Ambulatory Visit: Payer: Self-pay

## 2016-01-15 DIAGNOSIS — Z1211 Encounter for screening for malignant neoplasm of colon: Secondary | ICD-10-CM

## 2016-01-15 DIAGNOSIS — Q438 Other specified congenital malformations of intestine: Secondary | ICD-10-CM

## 2016-01-15 NOTE — Telephone Encounter (Signed)
The pt was instructed on her virtual colon and advised to pick up contrast at [facility] [place] Monday or Tuesday  She verbalized understanding of the instructions.

## 2016-01-15 NOTE — Telephone Encounter (Signed)
Left message on machine to call back  

## 2016-01-15 NOTE — Telephone Encounter (Signed)
Patient returned phone call. Best # [phone]

## 2016-01-15 NOTE — Telephone Encounter (Signed)
You have been scheduled for a Virtual colon at [facility] on 01/25/16 arrive at 9:40 am [phone] pick up prep on Monday or Tuesday.  Appt will be 1 1/2 hours.  GI will go over the prep when she picks up the contrast.

## 2016-01-19 ENCOUNTER — Encounter: Payer: Self-pay | Admitting: Gastroenterology

## 2016-01-25 ENCOUNTER — Ambulatory Visit
Admission: RE | Admit: 2016-01-25 | Discharge: 2016-01-25 | Disposition: A | Discharge status: Home / Self Care | Payer: Medicare Other | Source: Ambulatory Visit | Attending: Gastroenterology | Admitting: Gastroenterology

## 2016-01-25 DIAGNOSIS — Z1211 Encounter for screening for malignant neoplasm of colon: Secondary | ICD-10-CM

## 2016-01-25 DIAGNOSIS — Q438 Other specified congenital malformations of intestine: Secondary | ICD-10-CM

## 2016-02-28 ENCOUNTER — Other Ambulatory Visit: Payer: Self-pay | Admitting: Pain Medicine

## 2016-05-16 ENCOUNTER — Ambulatory Visit (INDEPENDENT_AMBULATORY_CARE_PROVIDER_SITE_OTHER): Payer: Medicare Other | Admitting: Physician Assistant

## 2016-05-16 ENCOUNTER — Encounter: Payer: Self-pay | Admitting: Physician Assistant

## 2016-05-16 VITALS — BP 139/73 | HR 73 | Temp 97.4°F | Ht 63.0 in | Wt 236.2 lb

## 2016-05-16 DIAGNOSIS — Z23 Encounter for immunization: Secondary | ICD-10-CM | POA: Diagnosis not present

## 2016-05-16 DIAGNOSIS — I10 Essential (primary) hypertension: Secondary | ICD-10-CM | POA: Insufficient documentation

## 2016-05-16 DIAGNOSIS — N1832 Chronic kidney disease, stage 3b: Secondary | ICD-10-CM | POA: Insufficient documentation

## 2016-05-16 DIAGNOSIS — E039 Hypothyroidism, unspecified: Secondary | ICD-10-CM | POA: Diagnosis not present

## 2016-05-16 DIAGNOSIS — I152 Hypertension secondary to endocrine disorders: Secondary | ICD-10-CM | POA: Insufficient documentation

## 2016-05-16 DIAGNOSIS — E119 Type 2 diabetes mellitus without complications: Secondary | ICD-10-CM | POA: Diagnosis not present

## 2016-05-16 LAB — BAYER DCA HB A1C WAIVED: HB A1C: 8.1 % — AB (ref <7.0)

## 2016-05-16 MED ORDER — METFORMIN HCL 500 MG PO TABS: 500.0000 mg | ORAL_TABLET | Freq: Every day | ORAL | 1 refills | Status: DC

## 2016-05-16 MED ORDER — METFORMIN HCL 500 MG PO TABS: 500.0000 mg | ORAL_TABLET | Freq: Two times a day (BID) | ORAL | 1 refills | Status: DC

## 2016-05-16 NOTE — Progress Notes (Addendum)
BP 139/73   Pulse 73   Temp 97.4 F (36.3 C) (Oral)   Ht 5\' 3"  (1.6 m)   Wt 236 lb 3.2 oz (107.1 kg)   BMI 41.84 kg/m    Subjective:    Patient ID: [name] [name], female    DOB: Sep 10, 1940, 75 y.o.   MRN: [id number]  Tashvi Francies is a 75 y.o. female presenting on 05/16/2016 for Establish Care (labs )  HPI Patient here to be established as new patient at [facility].  This patient is known to me from [facility]. This patient was in with multiple medical conditions.  She has osteoarthritis of many joints. She has had hip replacement through Dr. Alvan Dame at [facility]. She has had early type 2 diabetes diagnosed this year. She is due an A1c today. She has been taking metformin 500 mg 1 daily with largest meal. She has essential hypertension, hypothyroidism, a distant history of migraine headaches, normal colonoscopy 2017, overactive bladder.  All of her medications are reviewed today. We will send one continued refill to [facility] for her metformin. And after her A1c is back we will plan to send to her [facility] mail in service.  Relevant past medical, surgical, family and social history reviewed and updated as indicated. Interim medical history since our last visit reviewed. Allergies and medications reviewed and updated.   Data reviewed from any sources in EPIC.  Review of Systems  Constitutional: Negative.  Negative for activity change, fatigue and fever.  HENT: Negative.   Eyes: Negative.   Respiratory: Negative.  Negative for cough.   Cardiovascular: Negative.  Negative for chest pain.  Gastrointestinal: Negative.  Negative for abdominal pain.  Endocrine: Negative.   Genitourinary: Negative.  Negative for dysuria.  Musculoskeletal: Negative.   Skin: Negative.   Neurological: Negative.     Social History   Social History  . Marital status: Widowed    Spouse name: N/A  . Number of children: N/A  . Years of education: N/A    Occupational History  . Not on file.   Social History Main Topics  . Smoking status: Never Smoker  . Smokeless tobacco: Never Used  . Alcohol use No  . Drug use: No  . Sexual activity: Not Currently   Other Topics Concern  . Not on file   Social History Narrative  . No narrative on file    Past Surgical History:  Procedure Laterality Date  . ABDOMINAL HYSTERECTOMY    . CATARACT EXTRACTION, BILATERAL    . JOINT REPLACEMENT Left    [id number]  . MULTIPLE TOOTH EXTRACTIONS     60's  . PARATHYROIDECTOMY    . TOTAL HIP ARTHROPLASTY Right 10/29/2013   Procedure: RIGHT TOTAL HIP ARTHROPLASTY ANTERIOR APPROACH;  Surgeon: Mauri Pole, MD;  Location: [facility];  Service: Orthopedics;  Laterality: Right;   2017 Colonoscopy normal [facility]  Family History  Problem Relation Age of Onset  . Colitis Sister 76    alive      Medication List       Accurate as of 05/16/16  8:49 AM. Always use your most recent med list.          allopurinol 100 MG tablet Commonly known as:  ZYLOPRIM Take 100 mg by mouth daily.   amLODipine 5 MG tablet Commonly known as:  NORVASC Take 5 mg by mouth every morning.   furosemide 40 MG tablet Commonly known as:  LASIX Take 40 mg by mouth  2 (two) times daily. 1 in the morning and 1/2 tablet at night   levothyroxine 25 MCG tablet Commonly known as:  SYNTHROID, LEVOTHROID Take 25 mcg by mouth daily before breakfast.   loratadine 10 MG tablet Commonly known as:  CLARITIN Take 10 mg by mouth daily.   losartan 100 MG tablet Commonly known as:  COZAAR Take 100 mg by mouth every morning.   meloxicam 7.5 MG tablet Commonly known as:  MOBIC Take 7.5 mg by mouth daily.   metFORMIN 500 MG tablet Commonly known as:  GLUCOPHAGE Take 1 tablet (500 mg total) by mouth daily with breakfast.   omeprazole 20 MG capsule Commonly known as:  PRILOSEC Take 20 mg by mouth daily. Takes at bedtime   oxybutynin 5 MG tablet Commonly known as:   DITROPAN Take 5 mg by mouth 2 (two) times daily.   simvastatin 40 MG tablet Commonly known as:  ZOCOR Take 40 mg by mouth daily.   Vitamin D-3 5000 UNITS Tabs Take 5,000 tablets by mouth daily.          Objective:    BP 139/73   Pulse 73   Temp 97.4 F (36.3 C) (Oral)   Ht 5\' 3"  (1.6 m)   Wt 236 lb 3.2 oz (107.1 kg)   BMI 41.84 kg/m   No Known Allergies Wt Readings from Last 3 Encounters:  05/16/16 236 lb 3.2 oz (107.1 kg)  01/13/16 242 lb (109.8 kg)  12/30/15 242 lb 12.8 oz (110.1 kg)    Physical Exam  Constitutional: She is oriented to person, place, and time. She appears well-developed and well-nourished.  HENT:  Head: Normocephalic and atraumatic.  Eyes: Conjunctivae and EOM are normal. Pupils are equal, round, and reactive to light.  Neck: Normal range of motion. Neck supple.  Cardiovascular: Normal rate, regular rhythm, normal heart sounds and intact distal pulses.   Pulmonary/Chest: Effort normal and breath sounds normal.  Abdominal: Soft. Bowel sounds are normal.  Neurological: She is alert and oriented to person, place, and time. She has normal reflexes.  Skin: Skin is warm and dry. No rash noted.  Psychiatric: She has a normal mood and affect. Her behavior is normal. Judgment and thought content normal.      Assessment & Plan:   1. Encounter for immunization - Flu vaccine HIGH DOSE PF  2. Type 2 diabetes mellitus without complication, without long-term current use of insulin (HCC) - Bayer DCA Hb A1c Waived - metFORMIN (GLUCOPHAGE) 500 MG tablet; Take 1 tablet (500 mg total) by mouth BID with foodt.  Dispense: 180 tablet; Refill: 1 Script sent to mail in  Pharmacy.  3. Morbid obesity ([place])  4. Essential hypertension  5. Hypothyroidism, unspecified type   Continue all other maintenance medications as listed above. Educational handout given for carb counting  Follow up plan: Return in about 6 months (around 11/14/2016).  Terald Sleeper  PA-C [facility] [address]  [place], [place] [address] [phone]   05/16/2016, 8:49 AM

## 2016-05-16 NOTE — Patient Instructions (Signed)

## 2016-05-16 NOTE — Addendum Note (Signed)
Addended by: Terald Sleeper on: 05/16/2016 12:32 PM   Modules accepted: Orders

## 2016-05-24 ENCOUNTER — Telehealth: Payer: Self-pay | Admitting: Physician Assistant

## 2016-05-24 NOTE — Telephone Encounter (Signed)
Medication sent to pharmacy for 1 tablet twice daily

## 2016-06-21 ENCOUNTER — Telehealth: Payer: Self-pay | Admitting: Physician Assistant

## 2016-06-21 DIAGNOSIS — E119 Type 2 diabetes mellitus without complications: Secondary | ICD-10-CM

## 2016-06-21 MED ORDER — BLOOD GLUCOSE MONITOR KIT: PACK | 11 refills | Status: DC

## 2016-06-21 NOTE — Telephone Encounter (Signed)
Order prepared

## 2016-06-21 NOTE — Telephone Encounter (Signed)
Patient aware that rx is ready to be picked up.  

## 2016-07-15 ENCOUNTER — Other Ambulatory Visit: Payer: Self-pay | Admitting: Physician Assistant

## 2016-07-15 DIAGNOSIS — E119 Type 2 diabetes mellitus without complications: Secondary | ICD-10-CM

## 2016-09-17 ENCOUNTER — Other Ambulatory Visit: Payer: Self-pay | Admitting: Physician Assistant

## 2016-09-17 DIAGNOSIS — E119 Type 2 diabetes mellitus without complications: Secondary | ICD-10-CM

## 2016-10-11 ENCOUNTER — Ambulatory Visit: Payer: Self-pay | Admitting: Physician Assistant

## 2016-10-17 ENCOUNTER — Encounter: Payer: Self-pay | Admitting: Physician Assistant

## 2016-10-17 ENCOUNTER — Ambulatory Visit (INDEPENDENT_AMBULATORY_CARE_PROVIDER_SITE_OTHER): Payer: Medicare Other | Admitting: Physician Assistant

## 2016-10-17 VITALS — BP 137/74 | HR 79 | Temp 97.4°F | Ht 63.0 in | Wt 229.0 lb

## 2016-10-17 DIAGNOSIS — E119 Type 2 diabetes mellitus without complications: Secondary | ICD-10-CM | POA: Diagnosis not present

## 2016-10-17 DIAGNOSIS — L821 Other seborrheic keratosis: Secondary | ICD-10-CM | POA: Diagnosis not present

## 2016-10-17 DIAGNOSIS — K219 Gastro-esophageal reflux disease without esophagitis: Secondary | ICD-10-CM | POA: Diagnosis not present

## 2016-10-17 DIAGNOSIS — M109 Gout, unspecified: Secondary | ICD-10-CM | POA: Insufficient documentation

## 2016-10-17 DIAGNOSIS — N3281 Overactive bladder: Secondary | ICD-10-CM | POA: Diagnosis not present

## 2016-10-17 DIAGNOSIS — E039 Hypothyroidism, unspecified: Secondary | ICD-10-CM

## 2016-10-17 DIAGNOSIS — I10 Essential (primary) hypertension: Secondary | ICD-10-CM

## 2016-10-17 DIAGNOSIS — M1A9XX0 Chronic gout, unspecified, without tophus (tophi): Secondary | ICD-10-CM | POA: Diagnosis not present

## 2016-10-17 DIAGNOSIS — M150 Primary generalized (osteo)arthritis: Secondary | ICD-10-CM

## 2016-10-17 DIAGNOSIS — E785 Hyperlipidemia, unspecified: Secondary | ICD-10-CM | POA: Diagnosis not present

## 2016-10-17 DIAGNOSIS — E1169 Type 2 diabetes mellitus with other specified complication: Secondary | ICD-10-CM | POA: Insufficient documentation

## 2016-10-17 DIAGNOSIS — M159 Polyosteoarthritis, unspecified: Secondary | ICD-10-CM

## 2016-10-17 LAB — BAYER DCA HB A1C WAIVED: HB A1C (BAYER DCA - WAIVED): 6.2 % (ref <7.0)

## 2016-10-17 MED ORDER — FUROSEMIDE 40 MG PO TABS: 40.0000 mg | ORAL_TABLET | Freq: Two times a day (BID) | ORAL | 3 refills | Status: DC

## 2016-10-17 MED ORDER — OXYBUTYNIN CHLORIDE 5 MG PO TABS: 5.0000 mg | ORAL_TABLET | Freq: Three times a day (TID) | ORAL | 3 refills | Status: DC

## 2016-10-17 MED ORDER — MELOXICAM 7.5 MG PO TABS: 7.5000 mg | ORAL_TABLET | Freq: Every day | ORAL | 3 refills | Status: DC

## 2016-10-17 MED ORDER — LOSARTAN POTASSIUM 100 MG PO TABS: 100.0000 mg | ORAL_TABLET | Freq: Every morning | ORAL | 3 refills | Status: DC

## 2016-10-17 MED ORDER — AMLODIPINE BESYLATE 5 MG PO TABS: 5.0000 mg | ORAL_TABLET | Freq: Every morning | ORAL | 3 refills | Status: DC

## 2016-10-17 MED ORDER — ALLOPURINOL 100 MG PO TABS: 100.0000 mg | ORAL_TABLET | Freq: Every day | ORAL | 3 refills | Status: DC

## 2016-10-17 MED ORDER — SIMVASTATIN 40 MG PO TABS: 40.0000 mg | ORAL_TABLET | Freq: Every day | ORAL | 3 refills | Status: DC

## 2016-10-17 MED ORDER — OMEPRAZOLE 20 MG PO CPDR: 20.0000 mg | DELAYED_RELEASE_CAPSULE | Freq: Every day | ORAL | 3 refills | Status: DC

## 2016-10-17 MED ORDER — LEVOTHYROXINE SODIUM 25 MCG PO TABS: 25.0000 ug | ORAL_TABLET | Freq: Every day | ORAL | 3 refills | Status: DC

## 2016-10-17 NOTE — Patient Instructions (Signed)
Seborrheic Keratosis Seborrheic keratosis is a common, noncancerous (benign) skin growth. This condition causes waxy, rough, tan, brown, or black spots to appear on the skin. These skin growths can be flat or raised. What are the causes? The cause of this condition is not known. What increases the risk? This condition is more likely to develop in:  People who have a family history of seborrheic keratosis.  People who are 50 or older.  People who are pregnant.  People who have had estrogen replacement therapy.  What are the signs or symptoms? This condition often occurs on the face, chest, shoulders, back, or other areas. These growths:  Are usually painless, but may become irritated and itchy.  Can be yellow, brown, black, or other colors.  Are slightly raised or have a flat surface.  Are sometimes rough or wart-like in texture.  Are often waxy on the surface.  Are round or oval-shaped.  Sometimes look like they are "stuck on."  Often occur in groups, but may occur as a single growth.  How is this diagnosed? This condition is diagnosed with a medical history and physical exam. A sample of the growth may be tested (skin biopsy). You may need to see a skin specialist (dermatologist). How is this treated? Treatment is not usually needed for this condition, unless the growths are irritated or are often bleeding. You may also choose to have the growths removed if you do not like their appearance. Most commonly, these growths are treated with a procedure in which liquid nitrogen is applied to "freeze" off the growth (cryosurgery). They may also be burned off with electricity or cut off. Follow these instructions at home:  Watch your growth for any changes.  Keep all follow-up visits as told by your health care provider. This is important.  Do not scratch or pick at the growth or growths. This can cause them to become irritated or infected. Contact a health care provider  if:  You suddenly have many new growths.  Your growth bleeds, itches, or hurts.  Your growth suddenly becomes larger or changes color. This information is not intended to replace advice given to you by your health care provider. Make sure you discuss any questions you have with your health care provider. Document Released: 08/13/2010 Document Revised: 12/17/2015 Document Reviewed: 11/26/2014 Elsevier Interactive Patient Education  2017 Elsevier Inc.  

## 2016-10-17 NOTE — Progress Notes (Addendum)
BP 137/74   Pulse 79   Temp 97.4 F (36.3 C) (Oral)   Ht _0  (1.6 m)   Wt 229 lb (103.9 kg)   BMI 40.57 kg/m    Subjective:    Patient ID: [name] [name], female    DOB: 1941-07-19, 76 y.o.   MRN: [id number]  HPI: [name] [name] is a 76 y.o. female presenting on 10/17/2016 for Follow-up; Diabetes; Hypothyroidism; and Hypertension  This patient comes in for periodic recheck on medications and conditions including Type 2 diabetes, hypertension, hypothyroidism, seborrheic keratoses, hyperlipidemia, overactive bladder, GERD, osteoarthritis, gout. Overall the patient reports that she has been doing well. Her seborrheic keratosis seem to be a little more irritated. She will sometimes catch it with her fingers when she is pulling off her pants are closed. She has never had one bleed. She has some peeling off of the areas. She is not overly concerned about having them removed if they are not cancerous at this time. She does need several medications sent into her mail-in pharmacy we will update that today. We will update labs today. Overall her joints are doing well, knees are the worst still having pain. The patient also reports that her sugars have been doing fairly well. Her morning sugars are always less than 120. She has had some afternoon or postprandial sugars around 155. I have told her that these are good ranges.   All medications are reviewed today. There are no reports of any problems with the medications. All of the medical conditions are reviewed and updated.  Lab work is reviewed and will be ordered as medically necessary. There are no new problems reported with today's visit.  Relevant past medical, surgical, family and social history reviewed and updated as indicated. Allergies and medications reviewed and updated.  Past Medical History:  Diagnosis Date  . Arthritis    Ostearthritis- hips, knees, fingers  . Breast cancer ([place]) 1997   pt denies  . Headache(784.0)    tx. Valproic acid    . Hypertension   . Hypothyroidism     Past Surgical History:  Procedure Laterality Date  . ABDOMINAL HYSTERECTOMY    . CATARACT EXTRACTION, BILATERAL    . JOINT REPLACEMENT Left    [id number]  . MULTIPLE TOOTH EXTRACTIONS     60's  . PARATHYROIDECTOMY    . TOTAL HIP ARTHROPLASTY Right 10/29/2013   Procedure: RIGHT TOTAL HIP ARTHROPLASTY ANTERIOR APPROACH;  Surgeon: Mauri Pole, MD;  Location: [facility];  Service: Orthopedics;  Laterality: Right;    Review of Systems  Constitutional: Negative.  Negative for activity change, fatigue and fever.  HENT: Negative.   Eyes: Negative.   Respiratory: Negative.  Negative for cough.   Cardiovascular: Negative.  Negative for chest pain.  Gastrointestinal: Negative.  Negative for abdominal pain.  Endocrine: Negative.   Genitourinary: Negative.  Negative for dysuria.  Musculoskeletal: Positive for arthralgias and joint swelling.  Skin: Positive for color change.  Neurological: Negative.   Psychiatric/Behavioral: Negative.     Allergies as of 10/17/2016   No Known Allergies     Medication List       Accurate as of 10/17/16 11:56 AM. Always use your most recent med list.          allopurinol 100 MG tablet Commonly known as:  ZYLOPRIM Take 1 tablet (100 mg total) by mouth daily.   amLODipine 5 MG tablet Commonly known as:  NORVASC Take 1 tablet (5 mg total) by mouth every morning.  blood glucose meter kit and supplies Kit Dispense based on patient and insurance preference. Use up to four times daily as directed. (FOR ICD-9 250.00, 250.01).   furosemide 40 MG tablet Commonly known as:  LASIX Take 1 tablet (40 mg total) by mouth 2 (two) times daily. 1 in the morning and 1/2 tablet at night   levothyroxine 25 MCG tablet Commonly known as:  SYNTHROID, LEVOTHROID Take 1 tablet (25 mcg total) by mouth daily before breakfast.   loratadine 10 MG tablet Commonly known as:  CLARITIN Take 10 mg by mouth daily.   losartan 100 MG  tablet Commonly known as:  COZAAR Take 1 tablet (100 mg total) by mouth every morning.   meloxicam 7.5 MG tablet Commonly known as:  MOBIC Take 1 tablet (7.5 mg total) by mouth daily.   metFORMIN 500 MG tablet Commonly known as:  GLUCOPHAGE TAKE 1 TABLET BY MOUTH TWO  TIMES DAILY WITH A MEAL   omeprazole 20 MG capsule Commonly known as:  PRILOSEC Take 1 capsule (20 mg total) by mouth daily. Takes at bedtime   oxybutynin 5 MG tablet Commonly known as:  DITROPAN Take 1 tablet (5 mg total) by mouth 3 (three) times daily.   simvastatin 40 MG tablet Commonly known as:  ZOCOR Take 1 tablet (40 mg total) by mouth daily.   Vitamin D-3 5000 units Tabs Take 5,000 tablets by mouth daily.          Objective:    BP 137/74   Pulse 79   Temp 97.4 F (36.3 C) (Oral)   Ht _0  (1.6 m)   Wt 229 lb (103.9 kg)   BMI 40.57 kg/m   No Known Allergies  Physical Exam  Constitutional: She is oriented to person, place, and time. She appears well-developed and well-nourished.  HENT:  Head: Normocephalic and atraumatic.  Right Ear: Tympanic membrane, external ear and ear canal normal.  Left Ear: Tympanic membrane, external ear and ear canal normal.  Nose: Nose normal. No rhinorrhea.  Mouth/Throat: Oropharynx is clear and moist and mucous membranes are normal. No oropharyngeal exudate or posterior oropharyngeal erythema.  Eyes: Conjunctivae and EOM are normal. Pupils are equal, round, and reactive to light.  Neck: Normal range of motion. Neck supple.  Cardiovascular: Normal rate, regular rhythm, normal heart sounds and intact distal pulses.   Pulmonary/Chest: Effort normal and breath sounds normal.  Abdominal: Soft. Bowel sounds are normal.  Neurological: She is alert and oriented to person, place, and time. She has normal reflexes.  Skin: Skin is warm and dry. No rash noted.  Several stuck on seborrheic keratosis lesions on her trunk and leg. Watchful waiting.  Psychiatric: She has a  normal mood and affect. Her behavior is normal. Judgment and thought content normal.  Nursing note and vitals reviewed.  Diabetic Foot Exam - Simple   Simple Foot Form Diabetic Foot exam was performed with the following findings:  Yes 10/17/2016 12:41 PM  Visual Inspection No deformities, no ulcerations, no other skin breakdown bilaterally:  Yes Sensation Testing Intact to touch and monofilament testing bilaterally:  Yes Pulse Check Posterior Tibialis and Dorsalis pulse intact bilaterally:  Yes Comments     Results for orders placed or performed in visit on 05/16/16  Bayer DCA Hb A1c Waived  Result Value Ref Range   Bayer DCA Hb A1c Waived 8.1 (H) <7.0 %      Assessment & Plan:   1. Type 2 diabetes mellitus without complication, without long-term current  use of insulin (HCC) - CMP14+EGFR - CBC with Differential - Bayer DCA Hb A1c Waived - Lipid Panel - TSH - Microalbumin / creatinine urine ratio  2. Essential hypertension - losartan (COZAAR) 100 MG tablet; Take 1 tablet (100 mg total) by mouth every morning.  Dispense: 90 tablet; Refill: 3 - furosemide (LASIX) 40 MG tablet; Take 1 tablet (40 mg total) by mouth 2 (two) times daily. 1 in the morning and 1/2 tablet at night  Dispense: 135 tablet; Refill: 3 - amLODipine (NORVASC) 5 MG tablet; Take 1 tablet (5 mg total) by mouth every morning.  Dispense: 90 tablet; Refill: 3 - CMP14+EGFR - CBC with Differential - Bayer DCA Hb A1c Waived - Lipid Panel - TSH - Microalbumin / creatinine urine ratio  3. Hypothyroidism, unspecified type - levothyroxine (SYNTHROID, LEVOTHROID) 25 MCG tablet; Take 1 tablet (25 mcg total) by mouth daily before breakfast.  Dispense: 90 tablet; Refill: 3 - TSH  4. Seborrheic keratosis Wait and wait  5. Hyperlipidemia associated with type 2 diabetes mellitus (HCC) - simvastatin (ZOCOR) 40 MG tablet; Take 1 tablet (40 mg total) by mouth daily.  Dispense: 90 tablet; Refill: 3  6. Overactive  bladder - oxybutynin (DITROPAN) 5 MG tablet; Take 1 tablet (5 mg total) by mouth 3 (three) times daily.  Dispense: 270 tablet; Refill: 3  7. Gastroesophageal reflux disease without esophagitis - omeprazole (PRILOSEC) 20 MG capsule; Take 1 capsule (20 mg total) by mouth daily. Takes at bedtime  Dispense: 90 capsule; Refill: 3  8. Primary osteoarthritis involving multiple joints - meloxicam (MOBIC) 7.5 MG tablet; Take 1 tablet (7.5 mg total) by mouth daily.  Dispense: 90 tablet; Refill: 3  9. Chronic gout without tophus, unspecified cause, unspecified site - allopurinol (ZYLOPRIM) 100 MG tablet; Take 1 tablet (100 mg total) by mouth daily.  Dispense: 90 tablet; Refill: 3   Continue all other maintenance medications as listed above.  Follow up plan: Return in about 6 months (around 04/19/2017) for recheck.  Educational handout given for carb counting  Terald Sleeper PA-C [facility] [address]  [place], [place] [address] [phone]   10/17/2016, 11:56 AM

## 2016-10-18 LAB — CMP14+EGFR
A/G RATIO: 2.1 (ref 1.2–2.2)
ALT: 23 IU/L (ref 0–32)
AST: 17 IU/L (ref 0–40)
Albumin: 4.2 g/dL (ref 3.5–4.8)
Alkaline Phosphatase: 119 IU/L — ABNORMAL HIGH (ref 39–117)
BUN/Creatinine Ratio: 20 (ref 12–28)
BUN: 17 mg/dL (ref 8–27)
Bilirubin Total: 0.3 mg/dL (ref 0.0–1.2)
CO2: 26 mmol/L (ref 18–29)
Calcium: 10 mg/dL (ref 8.7–10.3)
Chloride: 105 mmol/L (ref 96–106)
Creatinine, Ser: 0.85 mg/dL (ref 0.57–1.00)
GFR calc Af Amer: 78 mL/min/{1.73_m2} (ref >59)
GFR calc non Af Amer: 67 mL/min/{1.73_m2} (ref >59)
GLOBULIN, TOTAL: 2 g/dL (ref 1.5–4.5)
Glucose: 99 mg/dL (ref 65–99)
POTASSIUM: 4.2 mmol/L (ref 3.5–5.2)
SODIUM: 146 mmol/L — AB (ref 134–144)
Total Protein: 6.2 g/dL (ref 6.0–8.5)

## 2016-10-18 LAB — CBC WITH DIFFERENTIAL/PLATELET
BASOS: 0 %
Basophils Absolute: 0 10*3/uL (ref 0.0–0.2)
EOS (ABSOLUTE): 0.2 10*3/uL (ref 0.0–0.4)
EOS: 4 %
HEMATOCRIT: 36.7 % (ref 34.0–46.6)
HEMOGLOBIN: 11.9 g/dL (ref 11.1–15.9)
Immature Grans (Abs): 0 10*3/uL (ref 0.0–0.1)
Immature Granulocytes: 0 %
LYMPHS ABS: 2.3 10*3/uL (ref 0.7–3.1)
Lymphs: 36 %
MCH: 27.9 pg (ref 26.6–33.0)
MCHC: 32.4 g/dL (ref 31.5–35.7)
MCV: 86 fL (ref 79–97)
MONOCYTES: 13 %
MONOS ABS: 0.8 10*3/uL (ref 0.1–0.9)
NEUTROS ABS: 3 10*3/uL (ref 1.4–7.0)
Neutrophils: 47 %
Platelets: 255 10*3/uL (ref 150–379)
RBC: 4.27 x10E6/uL (ref 3.77–5.28)
RDW: 14.6 % (ref 12.3–15.4)
WBC: 6.5 10*3/uL (ref 3.4–10.8)

## 2016-10-18 LAB — LIPID PANEL
CHOL/HDL RATIO: 2.5 ratio (ref 0.0–4.4)
Cholesterol, Total: 126 mg/dL (ref 100–199)
HDL: 51 mg/dL (ref >39)
LDL CALC: 61 mg/dL (ref 0–99)
Triglycerides: 71 mg/dL (ref 0–149)
VLDL CHOLESTEROL CAL: 14 mg/dL (ref 5–40)

## 2016-10-18 LAB — MICROALBUMIN / CREATININE URINE RATIO
CREATININE, UR: 21.5 mg/dL
MICROALBUM., U, RANDOM: 25.4 ug/mL
Microalb/Creat Ratio: 118.1 mg/g creat — ABNORMAL HIGH (ref 0.0–30.0)

## 2016-10-18 LAB — TSH: TSH: 2.66 u[IU]/mL (ref 0.450–4.500)

## 2016-10-19 ENCOUNTER — Other Ambulatory Visit: Payer: Self-pay | Admitting: *Deleted

## 2016-10-19 DIAGNOSIS — I10 Essential (primary) hypertension: Secondary | ICD-10-CM

## 2016-10-19 MED ORDER — FUROSEMIDE 40 MG PO TABS: ORAL_TABLET | ORAL | 3 refills | Status: DC

## 2016-12-23 DIAGNOSIS — G8929 Other chronic pain: Secondary | ICD-10-CM | POA: Diagnosis not present

## 2016-12-23 DIAGNOSIS — M1711 Unilateral primary osteoarthritis, right knee: Secondary | ICD-10-CM | POA: Diagnosis not present

## 2016-12-23 DIAGNOSIS — M1712 Unilateral primary osteoarthritis, left knee: Secondary | ICD-10-CM | POA: Diagnosis not present

## 2016-12-23 DIAGNOSIS — M25561 Pain in right knee: Secondary | ICD-10-CM | POA: Diagnosis not present

## 2017-01-13 DIAGNOSIS — M170 Bilateral primary osteoarthritis of knee: Secondary | ICD-10-CM | POA: Diagnosis not present

## 2017-01-19 DIAGNOSIS — G8929 Other chronic pain: Secondary | ICD-10-CM | POA: Diagnosis not present

## 2017-01-19 DIAGNOSIS — M25562 Pain in left knee: Secondary | ICD-10-CM | POA: Diagnosis not present

## 2017-01-19 DIAGNOSIS — M170 Bilateral primary osteoarthritis of knee: Secondary | ICD-10-CM | POA: Diagnosis not present

## 2017-01-19 DIAGNOSIS — M25561 Pain in right knee: Secondary | ICD-10-CM | POA: Diagnosis not present

## 2017-01-27 DIAGNOSIS — M1711 Unilateral primary osteoarthritis, right knee: Secondary | ICD-10-CM | POA: Diagnosis not present

## 2017-01-30 ENCOUNTER — Telehealth: Payer: Self-pay | Admitting: Physician Assistant

## 2017-01-30 DIAGNOSIS — E119 Type 2 diabetes mellitus without complications: Secondary | ICD-10-CM

## 2017-01-30 MED ORDER — METFORMIN HCL 500 MG PO TABS: ORAL_TABLET | ORAL | 0 refills | Status: DC

## 2017-01-30 NOTE — Telephone Encounter (Signed)
Pt notified script sent into [facility] Okayed per Particia Nearing

## 2017-03-20 ENCOUNTER — Other Ambulatory Visit: Payer: Self-pay | Admitting: Physician Assistant

## 2017-03-20 DIAGNOSIS — E119 Type 2 diabetes mellitus without complications: Secondary | ICD-10-CM

## 2017-03-21 NOTE — Telephone Encounter (Signed)
Last seen 10/17/16  [facility]

## 2017-05-22 ENCOUNTER — Other Ambulatory Visit: Payer: Self-pay | Admitting: Physician Assistant

## 2017-05-22 DIAGNOSIS — E119 Type 2 diabetes mellitus without complications: Secondary | ICD-10-CM

## 2017-05-22 NOTE — Telephone Encounter (Signed)
Last seen 10/17/16  [facility]

## 2017-07-31 ENCOUNTER — Ambulatory Visit: Payer: Medicare Other

## 2017-08-01 ENCOUNTER — Ambulatory Visit (INDEPENDENT_AMBULATORY_CARE_PROVIDER_SITE_OTHER): Payer: Medicare Other | Admitting: *Deleted

## 2017-08-01 ENCOUNTER — Encounter: Payer: Self-pay | Admitting: *Deleted

## 2017-08-01 VITALS — BP 147/74 | HR 65 | Ht 61.0 in | Wt 226.0 lb

## 2017-08-01 DIAGNOSIS — Z23 Encounter for immunization: Secondary | ICD-10-CM

## 2017-08-01 DIAGNOSIS — Z0000 Encounter for general adult medical examination without abnormal findings: Secondary | ICD-10-CM

## 2017-08-01 DIAGNOSIS — Z780 Asymptomatic menopausal state: Secondary | ICD-10-CM

## 2017-08-01 NOTE — Progress Notes (Signed)
Subjective:   [name] [name] is a 77 y.o. female who presents for an Initial Medicare Annual Wellness Visit. [name] [name] is retired and lives in a one story home. She has 3 adult sons and one adult daughter, 8 grandchildren, and 4 great grandchildren. One of her sons lives with her. 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 She enjoys playing games and meets with friends and family several times a week to play board games.   Review of Systems    Reports that health is about the same as last year.   Cardiac Risk Factors include: diabetes mellitus;hypertension;family history of premature cardiovascular disease;obesity (BMI >30kg/m2);sedentary lifestyle;dyslipidemia  Musculoskeletal: Bilateral knee pain. Sees ortho for injections about every 6 months. Takes meloxicam daily.     Objective:    Today's Vitals   08/01/17 1004 08/01/17 1007  BP: (!) 147/74   Pulse: 65   Weight: 226 lb (102.5 kg)   Height: 5' 1"  (1.549 m)   PainSc:  5    Body mass index is 42.7 kg/m.  Advanced Directives 08/01/2017 12/30/2015 10/29/2013 10/22/2013  Does Patient Have a Medical Advance Directive? Yes Yes Patient has advance directive, copy not in chart Patient has advance directive, copy not in chart  Type of Advance Directive [place];Living will [place];Living will [place];Living will [place];Living will  Does patient want to make changes to medical advance directive? No - Patient declined No - Patient declined No change requested No change requested  Copy of [place] in Chart? - No - copy requested Copy requested from family Copy requested from family  Pre-existing out of facility DNR order (yellow form or pink MOST form) - - No No    Current Medications (verified) Outpatient Encounter Medications as of 08/01/2017  Medication Sig  . allopurinol (ZYLOPRIM) 100 MG tablet Take 1 tablet (100 mg total) by  mouth daily.  Marland Kitchen amLODipine (NORVASC) 5 MG tablet Take 1 tablet (5 mg total) by mouth every morning.  . blood glucose meter kit and supplies KIT Dispense based on patient and insurance preference. Use up to four times daily as directed. (FOR ICD-9 250.00, 250.01).  . Cholecalciferol (VITAMIN D-3) 5000 UNITS TABS Take 5,000 tablets by mouth daily.  . furosemide (LASIX) 40 MG tablet 1 in the morning and 1/2 tablet at night  . levothyroxine (SYNTHROID, LEVOTHROID) 25 MCG tablet Take 1 tablet (25 mcg total) by mouth daily before breakfast.  . loratadine (CLARITIN) 10 MG tablet Take 10 mg by mouth daily.  [name] 100 MG tablet Take 1 tablet (100 mg total) by mouth every morning.  . meloxicam (MOBIC) 7.5 MG tablet Take 1 tablet (7.5 mg total) by mouth daily.  . metFORMIN (GLUCOPHAGE) 500 MG tablet TAKE 1 TABLET BY MOUTH TWO  TIMES DAILY WITH A MEAL  . omeprazole (PRILOSEC) 20 MG capsule Take 1 capsule (20 mg total) by mouth daily. Takes at bedtime  . oxybutynin (DITROPAN) 5 MG tablet Take 1 tablet (5 mg total) by mouth 3 (three) times daily.  . simvastatin (ZOCOR) 40 MG tablet Take 1 tablet (40 mg total) by mouth daily.   No facility-administered encounter medications on file as of 08/01/2017.     Allergies (verified) Patient has no known allergies.   History: Past Medical History:  Diagnosis Date  . Arthritis    Ostearthritis- hips, knees, fingers  . Breast cancer ([place]) 1997   pt  denies  . Headache(784.0)    tx. Valproic acid  . Hypertension   . Hypothyroidism    Past Surgical History:  Procedure Laterality Date  . ABDOMINAL HYSTERECTOMY    . CATARACT EXTRACTION, BILATERAL    . JOINT REPLACEMENT Left    [id number]  . MULTIPLE TOOTH EXTRACTIONS     60's  . PARATHYROIDECTOMY    . TOTAL HIP ARTHROPLASTY Right 10/29/2013   Procedure: RIGHT TOTAL HIP ARTHROPLASTY ANTERIOR APPROACH;  Surgeon: Mauri Pole, MD;  Location: [facility];  Service: Orthopedics;  Laterality: Right;    Family History  Problem Relation Age of Onset  . Colitis Sister 57       alive  . Cancer Sister 27       colon  . Cancer Mother 13       uterine  . Heart disease Father 34       heart failure  . Heart attack Brother   . Healthy Daughter   . Healthy Son   . Pulmonary embolism Brother   . Heart disease Brother   . Healthy Son   . Healthy Son    Social History   Socioeconomic History  . Marital status: Widowed    Spouse name: None  . Number of children: 4  . Years of education: 58  . Highest education level: High school graduate  Social Needs  . Financial resource strain: Not hard at all  . Food insecurity - worry: Never true  . Food insecurity - inability: Never true  . Transportation needs - medical: No  . Transportation needs - non-medical: No  Occupational History  . 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 Smoking status: Never Smoker  . Smokeless tobacco: Never Used  Substance and Sexual Activity  . Alcohol use: No    Alcohol/week: 0.0 oz  . Drug use: No  . Sexual activity: Not Currently  Other Topics Concern  . None  Social History Narrative   Patient is widowed and lives in a one story home. She has four adult children and one son lives with her.     Tobacco Counseling No tobacco use  Clinical Intake:    Pain : 0-10 Pain Score: 5  Pain Type: Chronic pain Pain Location: Knee Pain Orientation: Right, Left Pain Descriptors / Indicators: Aching Pain Onset: More than a month ago Pain Frequency: Intermittent Pain Relieving Factors: Meloxicam and joint injections. Last done in July 2018. Due for another round soon Effect of Pain on Daily Activities: Minimal. Able to do the things that she needs to do  Pain Relieving Factors: Meloxicam and joint injections. Last done in July 2018. Due for another round soon  BMI - recorded: 42.7 Nutritional Status: BMI > 30  Obese Diabetes: Yes Did pt. bring in CBG monitor from home?:  No  How often do you need to have someone help you when you read instructions, pamphlets, or other written materials from your doctor or pharmacy?: 1 - Never What is the last grade level you completed in school?: graduated high school  Interpreter Needed?: No  Information entered by :: Chong Sicilian, RN   Activities of Daily Living In your present state of health, do you have any difficulty performing the following activities: 08/01/2017  Hearing? N  Vision? N  Comment Cataracts removed in 2015. Last eye exam was in 2016. No trouble with vision  Difficulty concentrating or making decisions? N  Walking or climbing stairs? N  Comment Has some trouble with bilateral knee pain. Worse when walking down. Has a ramp at home and no stairs inside.   Dressing or bathing? N  Doing errands, shopping? N  Preparing Food and eating ? N  Using the Toilet? N  In the past six months, have you accidently leaked urine? Y  Comment Taking Ditropan TID. This has helped some. Mostly stress incontinence but some urge incontinence  Do you have problems with loss of bowel control? N  Managing your Medications? N  Managing your Finances? N  Housekeeping or managing your Housekeeping? N  Some recent data might be hidden     Immunizations and Health Maintenance Immunization History  Administered Date(s) Administered  . Influenza, High Dose Seasonal PF 05/16/2016, 08/01/2017   Health Maintenance Due  Topic Date Due  . OPHTHALMOLOGY EXAM  08/17/1950  . TETANUS/TDAP  08/16/1960  . DEXA SCAN  08/16/2006  . PNA vac Low Risk Adult (1 of 2 - PCV13) 08/16/2006  . INFLUENZA VACCINE  02/22/2017  . HEMOGLOBIN A1C  04/19/2017    Patient Care Team: Theodoro Clock as PCP - General (Physician Assistant) Paralee Cancel, MD as Consulting Physician (Orthopedic Surgery)  No ER visits, hospitalizations, or surgeries this past year.      Assessment:   This is a routine wellness examination for  [facility].  Hearing/Vision screen No deficits noted during visit. Eye exam is overdue.   Dietary issues and exercise activities discussed: Current Exercise Habits: The patient does not participate in regular exercise at present, Exercise limited by: orthopedic condition(s)(Bilateral knee pain. Has knee injections regularly. )  Goals    . Exercise 150 min/wk Moderate Activity      Depression Screen PHQ 2/9 Scores 08/01/2017 10/17/2016 05/16/2016  PHQ - 2 Score 0 0 0    Fall Risk Fall Risk  08/01/2017 10/17/2016 05/16/2016  Falls in the past year? No No No    Is the patient's home free of loose throw rugs in walkways, pet beds, electrical cords, etc?   yes      Grab bars in the bathroom? no      Handrails on the stairs?   yes      Adequate lighting?   yes  Cognitive Function: MMSE - Mini Mental State Exam 08/01/2017  Orientation to time 5  Orientation to Place 5  Registration 3  Attention/ Calculation 5  Recall 2  Language- name 2 objects 2  Language- repeat 1  Language- follow 3 step command 3  Language- read & follow direction 1  Write a sentence 1  Copy design 1  Total score 29  Normal exam      Screening Tests Health Maintenance  Topic Date Due  . OPHTHALMOLOGY EXAM  08/17/1950  . TETANUS/TDAP  08/16/1960  . DEXA SCAN  08/16/2006  . PNA vac Low Risk Adult (1 of 2 - PCV13) 08/16/2006  . INFLUENZA VACCINE  02/22/2017  . HEMOGLOBIN A1C  04/19/2017  . FOOT EXAM  10/17/2017  . COLONOSCOPY  01/24/2026   No more colonoscopies needed per GI doctor.   Reports having pneumonia shot at [facility].  Plan:  Routine follow up scheduled with PCP Dexa scan at next visit. Order placed.  Flu shot given today.  Schedule eye exam and have them send a copy to our office I will try to locate the pneumonia shot records   I have personally reviewed and noted the following in the patient's chart:   . Medical  and social history . Use of alcohol, tobacco or illicit drugs   . Current medications and supplements . Functional ability and status . Nutritional status . Physical activity . Advanced directives . List of other physicians . Hospitalizations, surgeries, and ER visits in previous 12 months . Vitals . Screenings to include cognitive, depression, and falls . Referrals and appointments  In addition, I have reviewed and discussed with patient certain preventive protocols, quality metrics, and best practice recommendations. A written personalized care plan for preventive services as well as general preventive health recommendations were provided to patient.     Chong Sicilian, RN  08/01/2017   I have reviewed and agree with the above AWV documentation.   Terald Sleeper PA-C [place] [address]  [place], [place] [address] [phone]

## 2017-08-01 NOTE — Patient Instructions (Signed)
[name] , Thank you for taking time to come for your Medicare Wellness Visit. I appreciate your ongoing commitment to your health goals. Please review the following plan we discussed and let me know if I can assist you in the future.   These are the goals we discussed: Goals    . Exercise 150 min/wk Moderate Activity       This is a list of the screening recommended for you and due dates:  Health Maintenance  Topic Date Due  . Eye exam for diabetics  08/17/1950  . Tetanus Vaccine  08/16/1960  . DEXA scan (bone density measurement)  08/16/2006  . Pneumonia vaccines (1 of 2 - PCV13) 08/16/2006  . Flu Shot  02/22/2017  . Hemoglobin A1C  04/19/2017  . Complete foot exam   10/17/2017  . Colon Cancer Screening  01/24/2026   Schedule your eye exam and have them send a copy of the report to our office.  Dexa scan will be ordered for your next visit. You received a flu shot today.   Influenza (Flu) Vaccine (Inactivated or Recombinant): What You Need to Know 1. Why get vaccinated? Influenza ("flu") is a contagious disease that spreads around the [place] every year, usually between October and May. Flu is caused by influenza viruses, and is spread mainly by coughing, sneezing, and close contact. Anyone can get flu. Flu strikes suddenly and can last several days. Symptoms vary by age, but can include:  fever/chills  sore throat  muscle aches  fatigue  cough  headache  runny or stuffy nose  Flu can also lead to pneumonia and blood infections, and cause diarrhea and seizures in children. If you have a medical condition, such as heart or lung disease, flu can make it worse. Flu is more dangerous for some people. Infants and young children, people 78 years of age and older, pregnant women, and people with certain health conditions or a weakened immune system are at greatest risk. Each year thousands of people in the [place] die from flu, and many more are  hospitalized. Flu vaccine can:  keep you from getting flu,  make flu less severe if you do get it, and  keep you from spreading flu to your family and other people. 2. Inactivated and recombinant flu vaccines A dose of flu vaccine is recommended every flu season. Children 6 months through 51 years of age may need two doses during the same flu season. Everyone else needs only one dose each flu season. Some inactivated flu vaccines contain a very small amount of a mercury-based preservative called thimerosal. Studies have not shown thimerosal in vaccines to be harmful, but flu vaccines that do not contain thimerosal are available. There is no live flu virus in flu shots. They cannot cause the flu. There are many flu viruses, and they are always changing. Each year a new flu vaccine is made to protect against three or four viruses that are likely to cause disease in the upcoming flu season. But even when the vaccine doesn't exactly match these viruses, it may still provide some protection. Flu vaccine cannot prevent:  flu that is caused by a virus not covered by the vaccine, or  illnesses that look like flu but are not.  It takes about 2 weeks for protection to develop after vaccination, and protection lasts through the flu season. 3. Some people should not get this vaccine Tell the person who is giving you the vaccine:  If you have  any severe, life-threatening allergies. If you ever had a life-threatening allergic reaction after a dose of flu vaccine, or have a severe allergy to any part of this vaccine, you may be advised not to get vaccinated. Most, but not all, types of flu vaccine contain a small amount of egg protein.  If you ever had Guillain-Barr Syndrome (also called GBS). Some people with a history of GBS should not get this vaccine. This should be discussed with your doctor.  If you are not feeling well. It is usually okay to get flu vaccine when you have a mild illness, but you  might be asked to come back when you feel better.  4. Risks of a vaccine reaction With any medicine, including vaccines, there is a chance of reactions. These are usually mild and go away on their own, but serious reactions are also possible. Most people who get a flu shot do not have any problems with it. Minor problems following a flu shot include:  soreness, redness, or swelling where the shot was given  hoarseness  sore, red or itchy eyes  cough  fever  aches  headache  itching  fatigue  If these problems occur, they usually begin soon after the shot and last 1 or 2 days. More serious problems following a flu shot can include the following:  There may be a small increased risk of Guillain-Barre Syndrome (GBS) after inactivated flu vaccine. This risk has been estimated at 1 or 2 additional cases per million people vaccinated. This is much lower than the risk of severe complications from flu, which can be prevented by flu vaccine.  Young children who get the flu shot along with pneumococcal vaccine (PCV13) and/or DTaP vaccine at the same time might be slightly more likely to have a seizure caused by fever. Ask your doctor for more information. Tell your doctor if a child who is getting flu vaccine has ever had a seizure.  Problems that could happen after any injected vaccine:  People sometimes faint after a medical procedure, including vaccination. Sitting or lying down for about 15 minutes can help prevent fainting, and injuries caused by a fall. Tell your doctor if you feel dizzy, or have vision changes or ringing in the ears.  Some people get severe pain in the shoulder and have difficulty moving the arm where a shot was given. This happens very rarely.  Any medication can cause a severe allergic reaction. Such reactions from a vaccine are very rare, estimated at about 1 in a million doses, and would happen within a few minutes to a few hours after the vaccination. As with  any medicine, there is a very remote chance of a vaccine causing a serious injury or death. The safety of vaccines is always being monitored. For more information, visit: http://www.aguilar.org/ 5. What if there is a serious reaction? What should I look for? Look for anything that concerns you, such as signs of a severe allergic reaction, very high fever, or unusual behavior. Signs of a severe allergic reaction can include hives, swelling of the face and throat, difficulty breathing, a fast heartbeat, dizziness, and weakness. These would start a few minutes to a few hours after the vaccination. What should I do?  If you think it is a severe allergic reaction or other emergency that can't wait, call 9-1-1 and get the person to the nearest hospital. Otherwise, call your doctor.  Reactions should be reported to the Vaccine Adverse Event Reporting System (VAERS). Your doctor  should file this report, or you can do it yourself through the VAERS web site at www.vaers.SamedayNews.es, or by calling 782-127-1364. ? VAERS does not give medical advice. 6. The National Vaccine Injury Compensation Program The [employer] Vaccine Injury Compensation Program (VICP) is a federal program that was created to compensate people who may have been injured by certain vaccines. Persons who believe they may have been injured by a vaccine can learn about the program and about filing a claim by calling (782)117-3035 or visiting the [place] website at GoldCloset.com.ee. There is a time limit to file a claim for compensation. 7. How can I learn more?  Ask your healthcare provider. He or she can give you the vaccine package insert or suggest other sources of information.  Call your local or state health department.  Contact the Centers for Disease Control and Prevention (CDC): ? Call (206)316-1195 (1-800-CDC-INFO) or ? Visit CDC's website at https://gibson.com/ Vaccine Information Statement, Inactivated Influenza  Vaccine (02/28/2014) This information is not intended to replace advice given to you by your health care provider. Make sure you discuss any questions you have with your health care provider. Document Released: 05/05/2006 Document Revised: 03/31/2016 Document Reviewed: 03/31/2016 Elsevier Interactive Patient Education  2017 [employer].

## 2017-08-11 ENCOUNTER — Other Ambulatory Visit: Payer: Self-pay | Admitting: Physician Assistant

## 2017-08-11 DIAGNOSIS — I10 Essential (primary) hypertension: Secondary | ICD-10-CM

## 2017-08-11 DIAGNOSIS — K219 Gastro-esophageal reflux disease without esophagitis: Secondary | ICD-10-CM

## 2017-08-11 DIAGNOSIS — M150 Primary generalized (osteo)arthritis: Secondary | ICD-10-CM

## 2017-08-11 DIAGNOSIS — E039 Hypothyroidism, unspecified: Secondary | ICD-10-CM

## 2017-08-11 DIAGNOSIS — E785 Hyperlipidemia, unspecified: Secondary | ICD-10-CM

## 2017-08-11 DIAGNOSIS — E1169 Type 2 diabetes mellitus with other specified complication: Secondary | ICD-10-CM

## 2017-08-11 DIAGNOSIS — E119 Type 2 diabetes mellitus without complications: Secondary | ICD-10-CM

## 2017-08-11 DIAGNOSIS — M159 Polyosteoarthritis, unspecified: Secondary | ICD-10-CM

## 2017-08-11 DIAGNOSIS — M1A9XX0 Chronic gout, unspecified, without tophus (tophi): Secondary | ICD-10-CM

## 2017-08-11 NOTE — Telephone Encounter (Signed)
Last seen 10/17/16  [facility]

## 2017-08-30 ENCOUNTER — Ambulatory Visit (INDEPENDENT_AMBULATORY_CARE_PROVIDER_SITE_OTHER): Payer: Medicare Other | Admitting: Physician Assistant

## 2017-08-30 ENCOUNTER — Ambulatory Visit (INDEPENDENT_AMBULATORY_CARE_PROVIDER_SITE_OTHER): Payer: Medicare Other

## 2017-08-30 ENCOUNTER — Encounter: Payer: Self-pay | Admitting: Physician Assistant

## 2017-08-30 DIAGNOSIS — E785 Hyperlipidemia, unspecified: Secondary | ICD-10-CM | POA: Diagnosis not present

## 2017-08-30 DIAGNOSIS — M150 Primary generalized (osteo)arthritis: Secondary | ICD-10-CM

## 2017-08-30 DIAGNOSIS — K219 Gastro-esophageal reflux disease without esophagitis: Secondary | ICD-10-CM | POA: Diagnosis not present

## 2017-08-30 DIAGNOSIS — M159 Polyosteoarthritis, unspecified: Secondary | ICD-10-CM

## 2017-08-30 DIAGNOSIS — Z780 Asymptomatic menopausal state: Secondary | ICD-10-CM | POA: Diagnosis not present

## 2017-08-30 DIAGNOSIS — M1A9XX0 Chronic gout, unspecified, without tophus (tophi): Secondary | ICD-10-CM

## 2017-08-30 DIAGNOSIS — E1169 Type 2 diabetes mellitus with other specified complication: Secondary | ICD-10-CM

## 2017-08-30 DIAGNOSIS — I10 Essential (primary) hypertension: Secondary | ICD-10-CM

## 2017-08-30 DIAGNOSIS — N3281 Overactive bladder: Secondary | ICD-10-CM | POA: Diagnosis not present

## 2017-08-30 DIAGNOSIS — E119 Type 2 diabetes mellitus without complications: Secondary | ICD-10-CM

## 2017-08-30 DIAGNOSIS — E039 Hypothyroidism, unspecified: Secondary | ICD-10-CM | POA: Diagnosis not present

## 2017-08-30 LAB — BAYER DCA HB A1C WAIVED: HB A1C (BAYER DCA - WAIVED): 5.7 % (ref <7.0)

## 2017-08-30 MED ORDER — FUROSEMIDE 40 MG PO TABS: ORAL_TABLET | ORAL | 3 refills | Status: DC

## 2017-08-30 MED ORDER — MELOXICAM 7.5 MG PO TABS: 7.5000 mg | ORAL_TABLET | Freq: Every day | ORAL | 3 refills | Status: DC

## 2017-08-30 MED ORDER — LOSARTAN POTASSIUM 100 MG PO TABS: 100.0000 mg | ORAL_TABLET | Freq: Every morning | ORAL | 3 refills | Status: DC

## 2017-08-30 MED ORDER — OMEPRAZOLE 20 MG PO CPDR: 20.0000 mg | DELAYED_RELEASE_CAPSULE | Freq: Two times a day (BID) | ORAL | 3 refills | Status: DC

## 2017-08-30 MED ORDER — ALLOPURINOL 100 MG PO TABS: 100.0000 mg | ORAL_TABLET | Freq: Every day | ORAL | 3 refills | Status: DC

## 2017-08-30 MED ORDER — METFORMIN HCL 500 MG PO TABS: 500.0000 mg | ORAL_TABLET | Freq: Two times a day (BID) | ORAL | 3 refills | Status: DC

## 2017-08-30 MED ORDER — AMLODIPINE BESYLATE 5 MG PO TABS: 5.0000 mg | ORAL_TABLET | Freq: Every morning | ORAL | 3 refills | Status: DC

## 2017-08-30 MED ORDER — SIMVASTATIN 40 MG PO TABS: 40.0000 mg | ORAL_TABLET | Freq: Every day | ORAL | 3 refills | Status: DC

## 2017-08-30 MED ORDER — OXYBUTYNIN CHLORIDE 5 MG PO TABS: 5.0000 mg | ORAL_TABLET | Freq: Three times a day (TID) | ORAL | 3 refills | Status: DC

## 2017-08-30 NOTE — Patient Instructions (Signed)
In a few days you may receive a survey in the mail or online from [employer] regarding your visit with us today. Please take a moment to fill this out. Your feedback is very important to our whole office. It can help us better understand your needs as well as improve your experience and satisfaction. Thank you for taking your time to complete it. We care about you.  Hisayo Delossantos, PA-C  

## 2017-08-31 DIAGNOSIS — Z780 Asymptomatic menopausal state: Secondary | ICD-10-CM | POA: Diagnosis not present

## 2017-08-31 DIAGNOSIS — M85832 Other specified disorders of bone density and structure, left forearm: Secondary | ICD-10-CM | POA: Diagnosis not present

## 2017-08-31 LAB — CMP14+EGFR
ALT: 15 IU/L (ref 0–32)
AST: 13 IU/L (ref 0–40)
Albumin/Globulin Ratio: 2 (ref 1.2–2.2)
Albumin: 4.5 g/dL (ref 3.5–4.8)
Alkaline Phosphatase: 133 IU/L — ABNORMAL HIGH (ref 39–117)
BUN/Creatinine Ratio: 22 (ref 12–28)
BUN: 19 mg/dL (ref 8–27)
Bilirubin Total: 0.4 mg/dL (ref 0.0–1.2)
CALCIUM: 10.3 mg/dL (ref 8.7–10.3)
CO2: 23 mmol/L (ref 20–29)
Chloride: 101 mmol/L (ref 96–106)
Creatinine, Ser: 0.87 mg/dL (ref 0.57–1.00)
GFR calc Af Amer: 75 mL/min/{1.73_m2} (ref >59)
GFR, EST NON AFRICAN AMERICAN: 65 mL/min/{1.73_m2} (ref >59)
Globulin, Total: 2.2 g/dL (ref 1.5–4.5)
Glucose: 99 mg/dL (ref 65–99)
Potassium: 4.1 mmol/L (ref 3.5–5.2)
Sodium: 141 mmol/L (ref 134–144)
Total Protein: 6.7 g/dL (ref 6.0–8.5)

## 2017-08-31 LAB — CBC WITH DIFFERENTIAL/PLATELET
Basophils Absolute: 0 10*3/uL (ref 0.0–0.2)
Basos: 0 %
EOS (ABSOLUTE): 0.2 10*3/uL (ref 0.0–0.4)
EOS: 3 %
Hematocrit: 35.3 % (ref 34.0–46.6)
Hemoglobin: 10.9 g/dL — ABNORMAL LOW (ref 11.1–15.9)
IMMATURE GRANULOCYTES: 0 %
Immature Grans (Abs): 0 10*3/uL (ref 0.0–0.1)
Lymphocytes Absolute: 2.2 10*3/uL (ref 0.7–3.1)
Lymphs: 36 %
MCH: 25.2 pg — ABNORMAL LOW (ref 26.6–33.0)
MCHC: 30.9 g/dL — ABNORMAL LOW (ref 31.5–35.7)
MCV: 82 fL (ref 79–97)
MONOS ABS: 0.7 10*3/uL (ref 0.1–0.9)
Monocytes: 11 %
NEUTROS PCT: 50 %
Neutrophils Absolute: 3.1 10*3/uL (ref 1.4–7.0)
PLATELETS: 280 10*3/uL (ref 150–379)
RBC: 4.32 x10E6/uL (ref 3.77–5.28)
RDW: 15.5 % — AB (ref 12.3–15.4)
WBC: 6.2 10*3/uL (ref 3.4–10.8)

## 2017-08-31 LAB — LIPID PANEL
CHOL/HDL RATIO: 2.7 ratio (ref 0.0–4.4)
Cholesterol, Total: 147 mg/dL (ref 100–199)
HDL: 55 mg/dL (ref >39)
LDL CALC: 74 mg/dL (ref 0–99)
TRIGLYCERIDES: 89 mg/dL (ref 0–149)
VLDL Cholesterol Cal: 18 mg/dL (ref 5–40)

## 2017-08-31 NOTE — Progress Notes (Signed)
BP (!) 153/81   Pulse 70   Temp (!) 97.1 F (36.2 C) (Oral)   Ht 5' 1" (1.549 m)   Wt 232 lb (105.2 kg)   BMI 43.84 kg/m    Subjective:    Patient ID: [name] [name], female    DOB: Jun 12, 1941, 77 y.o.   MRN: [id number]  HPI: [name] [name] is a 77 y.o. female presenting on 08/30/2017 for Follow-up; Diabetes; Hyperlipidemia; and Hypertension  This patient comes in for annual well physical examination. All medications are reviewed today. There are no reports of any problems with the medications. All of the medical conditions are reviewed and updated.  Lab work is reviewed and will be ordered as medically necessary. There are no new problems reported with today's visit.  Patient reports doing well overall.   Past Medical History:  Diagnosis Date  . Arthritis    Ostearthritis- hips, knees, fingers  . Breast cancer ([place]) 1997   pt denies  . Headache(784.0)    tx. Valproic acid  . Hypertension   . Hypothyroidism    Relevant past medical, surgical, family and social history reviewed and updated as indicated. Interim medical history since our last visit reviewed. Allergies and medications reviewed and updated. DATA REVIEWED: CHART IN EPIC  Family History reviewed for pertinent findings.  Review of Systems  Constitutional: Negative.  Negative for activity change, fatigue and fever.  HENT: Negative.   Eyes: Negative.   Respiratory: Negative.  Negative for cough.   Cardiovascular: Negative.  Negative for chest pain.  Gastrointestinal: Negative.  Negative for abdominal pain.  Endocrine: Negative.   Genitourinary: Negative.  Negative for dysuria.  Musculoskeletal: Negative.   Skin: Negative.   Neurological: Negative.     Allergies as of 08/30/2017   No Known Allergies     Medication List        Accurate as of 08/30/17 11:59 PM. Always use your most recent med list.          allopurinol 100 MG tablet Commonly known as:  ZYLOPRIM Take 1 tablet (100 mg total) by mouth daily.   amLODipine 5 MG tablet Commonly known as:  NORVASC Take 1 tablet (5 mg total) by mouth every morning.   blood glucose meter kit and supplies Kit Dispense based on patient and insurance preference. Use up to four times daily as directed. (FOR ICD-9 250.00, 250.01).   furosemide 40 MG tablet Commonly known as:  LASIX TAKE 1 TABLET BY MOUTH IN  THE MORNING AND ONE-HALF  TABLET BY MOUTH AT NIGHT   levothyroxine 25 MCG tablet Commonly known as:  SYNTHROID, LEVOTHROID TAKE 1 TABLET BY MOUTH  DAILY BEFORE BREAKFAST   loratadine 10 MG tablet Commonly known as:  CLARITIN Take 10 mg by mouth daily.   losartan 100 MG tablet Commonly known as:  COZAAR Take 1 tablet (100 mg total) by mouth every morning.   meloxicam 7.5 MG tablet Commonly known as:  MOBIC Take 1 tablet (7.5 mg total) by mouth daily.   metFORMIN 500 MG tablet Commonly known as:  GLUCOPHAGE Take 1 tablet (500 mg total) by mouth 2 (two) times daily with a meal.   omeprazole 20 MG capsule Commonly known as:  PRILOSEC Take 1 capsule (20 mg total) by mouth 2 (two) times daily before a meal.   oxybutynin 5 MG tablet Commonly known as:  DITROPAN Take 1 tablet (5 mg total) by mouth 3 (three) times daily.   simvastatin 40 MG tablet Commonly known as:  ZOCOR Take 1 tablet (40 mg total) by mouth daily.   Vitamin D-3 5000 units Tabs Take 5,000 tablets by mouth daily.          Objective:    BP (!) 153/81   Pulse 70   Temp (!) 97.1 F (36.2 C) (Oral)   Ht '5\' 1"'$  (1.549 m)   Wt 232 lb (105.2 kg)   BMI 43.84 kg/m   No Known Allergies  Wt Readings from Last 3 Encounters:  08/30/17 232 lb (105.2 kg)  08/01/17 226 lb (102.5 kg)  10/17/16 229 lb (103.9 kg)    Physical Exam  Constitutional: She is oriented to person, place, and time. She appears well-developed and well-nourished.  HENT:  Head: Normocephalic and atraumatic.  Right Ear: Tympanic membrane, external ear and ear canal normal.  Left Ear: Tympanic  membrane, external ear and ear canal normal.  Nose: Nose normal. No rhinorrhea.  Mouth/Throat: Oropharynx is clear and moist and mucous membranes are normal. No oropharyngeal exudate or posterior oropharyngeal erythema.  Eyes: Conjunctivae and EOM are normal. Pupils are equal, round, and reactive to light.  Neck: Normal range of motion. Neck supple.  Cardiovascular: Normal rate, regular rhythm, normal heart sounds and intact distal pulses.  Pulmonary/Chest: Effort normal and breath sounds normal.  Abdominal: Soft. Bowel sounds are normal.  Neurological: She is alert and oriented to person, place, and time. She has normal reflexes.  Skin: Skin is warm and dry. No rash noted.  Psychiatric: She has a normal mood and affect. Her behavior is normal. Judgment and thought content normal.    Results for orders placed or performed in visit on 08/30/17  CBC with Differential/Platelet  Result Value Ref Range   WBC 6.2 3.4 - 10.8 x10E3/uL   RBC 4.32 3.77 - 5.28 x10E6/uL   Hemoglobin 10.9 (L) 11.1 - 15.9 g/dL   Hematocrit 35.3 34.0 - 46.6 %   MCV 82 79 - 97 fL   MCH 25.2 (L) 26.6 - 33.0 pg   MCHC 30.9 (L) 31.5 - 35.7 g/dL   RDW 15.5 (H) 12.3 - 15.4 %   Platelets 280 150 - 379 x10E3/uL   Neutrophils 50 Not Estab. %   Lymphs 36 Not Estab. %   Monocytes 11 Not Estab. %   Eos 3 Not Estab. %   Basos 0 Not Estab. %   Neutrophils Absolute 3.1 1.4 - 7.0 x10E3/uL   Lymphocytes Absolute 2.2 0.7 - 3.1 x10E3/uL   Monocytes Absolute 0.7 0.1 - 0.9 x10E3/uL   EOS (ABSOLUTE) 0.2 0.0 - 0.4 x10E3/uL   Basophils Absolute 0.0 0.0 - 0.2 x10E3/uL   Immature Granulocytes 0 Not Estab. %   Immature Grans (Abs) 0.0 0.0 - 0.1 x10E3/uL  CMP14+EGFR  Result Value Ref Range   Glucose 99 65 - 99 mg/dL   BUN 19 8 - 27 mg/dL   Creatinine, Ser 0.87 0.57 - 1.00 mg/dL   GFR calc non Af Amer 65 >59 mL/min/1.73   GFR calc Af Amer 75 >59 mL/min/1.73   BUN/Creatinine Ratio 22 12 - 28   Sodium 141 134 - 144 mmol/L    Potassium 4.1 3.5 - 5.2 mmol/L   Chloride 101 96 - 106 mmol/L   CO2 23 20 - 29 mmol/L   Calcium 10.3 8.7 - 10.3 mg/dL   Total Protein 6.7 6.0 - 8.5 g/dL   Albumin 4.5 3.5 - 4.8 g/dL   Globulin, Total 2.2 1.5 - 4.5 g/dL   Albumin/Globulin Ratio 2.0 1.2 -  2.2   Bilirubin Total 0.4 0.0 - 1.2 mg/dL   Alkaline Phosphatase 133 (H) 39 - 117 IU/L   AST 13 0 - 40 IU/L   ALT 15 0 - 32 IU/L  Lipid panel  Result Value Ref Range   Cholesterol, Total 147 100 - 199 mg/dL   Triglycerides 89 0 - 149 mg/dL   HDL 55 >39 mg/dL   VLDL Cholesterol Cal 18 5 - 40 mg/dL   LDL Calculated 74 0 - 99 mg/dL   Chol/HDL Ratio 2.7 0.0 - 4.4 ratio  Bayer DCA Hb A1c Waived  Result Value Ref Range   Bayer DCA Hb A1c Waived 5.7 <7.0 %      Assessment & Plan:   1. Gastroesophageal reflux disease without esophagitis - omeprazole (PRILOSEC) 20 MG capsule; Take 1 capsule (20 mg total) by mouth 2 (two) times daily before a meal.  Dispense: 180 capsule; Refill: 3  2. Essential hypertension - losartan (COZAAR) 100 MG tablet; Take 1 tablet (100 mg total) by mouth every morning.  Dispense: 90 tablet; Refill: 3 - furosemide (LASIX) 40 MG tablet; TAKE 1 TABLET BY MOUTH IN  THE MORNING AND ONE-HALF  TABLET BY MOUTH AT NIGHT  Dispense: 135 tablet; Refill: 3 - amLODipine (NORVASC) 5 MG tablet; Take 1 tablet (5 mg total) by mouth every morning.  Dispense: 90 tablet; Refill: 3 - CBC with Differential/Platelet - CMP14+EGFR  3. Primary osteoarthritis involving multiple joints - meloxicam (MOBIC) 7.5 MG tablet; Take 1 tablet (7.5 mg total) by mouth daily.  Dispense: 90 tablet; Refill: 3  4. Type 2 diabetes mellitus without complication, without long-term current use of insulin (HCC) - metFORMIN (GLUCOPHAGE) 500 MG tablet; Take 1 tablet (500 mg total) by mouth 2 (two) times daily with a meal.  Dispense: 180 tablet; Refill: 3 - Bayer DCA Hb A1c Waived  5. Overactive bladder - oxybutynin (DITROPAN) 5 MG tablet; Take 1 tablet (5  mg total) by mouth 3 (three) times daily.  Dispense: 270 tablet; Refill: 3  6. Hyperlipidemia associated with type 2 diabetes mellitus (HCC) - simvastatin (ZOCOR) 40 MG tablet; Take 1 tablet (40 mg total) by mouth daily.  Dispense: 90 tablet; Refill: 3 - Lipid panel  7. Hypothyroidism, unspecified type  8. Chronic gout without tophus, unspecified cause, unspecified site - allopurinol (ZYLOPRIM) 100 MG tablet; Take 1 tablet (100 mg total) by mouth daily.  Dispense: 90 tablet; Refill: 3   Continue all other maintenance medications as listed above.  Follow up plan: Return in about 1 year (around 08/30/2018) for well.  record release [place] and vaccine.  Educational handout given for [place] PA-C [facility] [address]  [place], [place] [address] [phone]   08/31/2017, 8:08 PM

## 2017-09-05 ENCOUNTER — Other Ambulatory Visit: Payer: Self-pay | Admitting: *Deleted

## 2017-09-05 MED ORDER — RALOXIFENE HCL 60 MG PO TABS: 60.0000 mg | ORAL_TABLET | Freq: Every day | ORAL | 1 refills | Status: DC

## 2017-09-08 ENCOUNTER — Telehealth: Payer: Self-pay | Admitting: Physician Assistant

## 2017-09-08 NOTE — Telephone Encounter (Signed)
Defer to PCP  [name] Apple, MD [facility] 09/08/2017, 11:45 AM

## 2017-09-11 ENCOUNTER — Other Ambulatory Visit: Payer: Self-pay | Admitting: Physician Assistant

## 2017-09-11 MED ORDER — ALENDRONATE SODIUM 70 MG PO TABS: 70.0000 mg | ORAL_TABLET | ORAL | 3 refills | Status: DC

## 2017-09-11 NOTE — Telephone Encounter (Signed)
Fosamax 70 mg one weekly on an empty stomach with full glass of water. Do not lie down or eat for 30 minutes after. 3 month supply sent to [id number]

## 2017-09-11 NOTE — Telephone Encounter (Signed)
Patient states she will try the Fosamax if she can afford it.

## 2017-09-11 NOTE — Telephone Encounter (Signed)
DC Evista Would she try Fosamax 70 mg one weekly?

## 2017-10-04 ENCOUNTER — Other Ambulatory Visit: Payer: Self-pay | Admitting: Physician Assistant

## 2017-10-09 ENCOUNTER — Encounter: Payer: Self-pay | Admitting: Nurse Practitioner

## 2017-10-09 ENCOUNTER — Ambulatory Visit (INDEPENDENT_AMBULATORY_CARE_PROVIDER_SITE_OTHER): Payer: Medicare Other | Admitting: Nurse Practitioner

## 2017-10-09 VITALS — BP 153/70 | HR 76 | Temp 97.2°F | Ht 61.0 in | Wt 235.0 lb

## 2017-10-09 DIAGNOSIS — M7552 Bursitis of left shoulder: Secondary | ICD-10-CM

## 2017-10-09 MED ORDER — METHYLPREDNISOLONE ACETATE 40 MG/ML IJ SUSP
40.0000 mg | Freq: Once | INTRAMUSCULAR | Status: AC
  Administered 2017-10-09: 40 mg via INTRAMUSCULAR

## 2017-10-09 MED ORDER — BUPIVACAINE HCL 0.25 % IJ SOLN
1.0000 mL | Freq: Once | INTRAMUSCULAR | Status: AC
  Administered 2017-10-09: 1 mL via INTRA_ARTICULAR

## 2017-10-09 NOTE — Progress Notes (Signed)
   Subjective:    Patient ID: [name] [name], female    DOB: [birth year], 77 y.o.   MRN: [id number]  HPI Patient comes in today c/o left shoulder pain. Started last week. Denies injury. Just woke up with it hurting. Has tried heating pad, ice, and tylenol with no relief. Rates pain 8-10/10. Certain movements  And positions make it worse. Nothing really seems to make it better.    Review of Systems  Constitutional: Negative.   HENT: Negative.   Respiratory: Negative.   Cardiovascular: Negative.   Genitourinary: Negative.   Musculoskeletal: Positive for arthralgias (left shoulder).  All other systems reviewed and are negative.      Objective:   Physical Exam  Constitutional: She is oriented to person, place, and time. She appears well-developed and well-nourished. No distress.  Cardiovascular: Regular rhythm.  Pulmonary/Chest: Effort normal.  Musculoskeletal:  FROM of left shoulder with pain on full abduction and internal rotation No point tenderness Motor strength and sensation distally intact   Neurological: She is alert and oriented to person, place, and time.  Skin: Skin is warm.  Psychiatric: She has a normal mood and affect. Her behavior is normal. Judgment and thought content normal.   BP (!) 153/70   Pulse 76   Temp (!) 97.2 F (36.2 C) (Oral)   Ht 5\' 1"  (1.549 m)   Wt 235 lb (106.6 kg)   BMI 44.40 kg/m   Joint injection- left shoulder  Betadine prep to posterior shoulder  Topical anesthetic  marciane 0.25% plain 49ml with deomedrol 40mg /ml 14ml injected with 22 g   PATIENT TOLERATED WELL.     Assessment & Plan:   1. Bursitis of left shoulder    Shoulder onjection Meds ordered this encounter  Medications  . bupivacaine (MARCAINE) 0.25 % (with pres) injection 1 mL  . methylPREDNISolone acetate (DEPO-MEDROL) injection 40 mg   Ice bid Rest RTO prn  Mary-Margaret Hassell Done, FNP

## 2017-10-09 NOTE — Patient Instructions (Signed)
Shoulder Pain Many things can cause shoulder pain, including:  An injury.  Moving the arm in the same way again and again (overuse).  Joint pain (arthritis).  Follow these instructions at home: Take these actions to help with your pain:  Squeeze a soft ball or a foam pad as much as you can. This helps to prevent swelling. It also makes the arm stronger.  Take over-the-counter and prescription medicines only as told by your doctor.  If told, put ice on the area: ? Put ice in a plastic bag. ? Place a towel between your skin and the bag. ? Leave the ice on for 20 minutes, 2-3 times per day. Stop putting on ice if it does not help with the pain.  If you were given a shoulder sling or immobilizer: ? Wear it as told. ? Remove it to shower or bathe. ? Move your arm as little as possible. ? Keep your hand moving. This helps prevent swelling.  Contact a doctor if:  Your pain gets worse.  Medicine does not help your pain.  You have new pain in your arm, hand, or fingers. Get help right away if:  Your arm, hand, or fingers: ? Tingle. ? Are numb. ? Are swollen. ? Are painful. ? Turn white or blue. This information is not intended to replace advice given to you by your health care provider. Make sure you discuss any questions you have with your health care provider. Document Released: 12/28/2007 Document Revised: 03/06/2016 Document Reviewed: 11/03/2014 Elsevier Interactive Patient Education  2018 [employer]vier Inc.  

## 2017-12-11 ENCOUNTER — Other Ambulatory Visit: Payer: Self-pay | Admitting: Physician Assistant

## 2017-12-11 DIAGNOSIS — E039 Hypothyroidism, unspecified: Secondary | ICD-10-CM

## 2017-12-11 NOTE — Telephone Encounter (Signed)
Last thyroid 10/17/16

## 2018-02-19 ENCOUNTER — Other Ambulatory Visit: Payer: Self-pay | Admitting: Physician Assistant

## 2018-02-19 DIAGNOSIS — E039 Hypothyroidism, unspecified: Secondary | ICD-10-CM

## 2018-03-05 ENCOUNTER — Other Ambulatory Visit: Payer: Self-pay

## 2018-03-05 ENCOUNTER — Other Ambulatory Visit: Payer: Self-pay | Admitting: Family Medicine

## 2018-03-05 DIAGNOSIS — E039 Hypothyroidism, unspecified: Secondary | ICD-10-CM

## 2018-03-05 MED ORDER — LEVOTHYROXINE SODIUM 25 MCG PO TABS: ORAL_TABLET | ORAL | 0 refills | Status: DC

## 2018-03-05 NOTE — Telephone Encounter (Signed)
Last seen 10/09/17 [name] [name]  Last thyroid 10/17/16

## 2018-03-05 NOTE — Telephone Encounter (Signed)
Patient aware and verbalizes understanding. 

## 2018-03-05 NOTE — Progress Notes (Signed)
Rx sent by PCP 02/20/2018.  Has to have OV for further fills.

## 2018-03-05 NOTE — Telephone Encounter (Signed)
No TSH check since 2018.  Rx x1 month.  Please have her schedule an appt w/ PCP for thyroid.

## 2018-07-31 ENCOUNTER — Other Ambulatory Visit: Payer: Self-pay | Admitting: Physician Assistant

## 2018-07-31 DIAGNOSIS — E119 Type 2 diabetes mellitus without complications: Secondary | ICD-10-CM

## 2018-07-31 DIAGNOSIS — M150 Primary generalized (osteo)arthritis: Principal | ICD-10-CM

## 2018-07-31 DIAGNOSIS — E1169 Type 2 diabetes mellitus with other specified complication: Secondary | ICD-10-CM

## 2018-07-31 DIAGNOSIS — N3281 Overactive bladder: Secondary | ICD-10-CM

## 2018-07-31 DIAGNOSIS — M1A9XX0 Chronic gout, unspecified, without tophus (tophi): Secondary | ICD-10-CM

## 2018-07-31 DIAGNOSIS — I10 Essential (primary) hypertension: Secondary | ICD-10-CM

## 2018-07-31 DIAGNOSIS — K219 Gastro-esophageal reflux disease without esophagitis: Secondary | ICD-10-CM

## 2018-07-31 DIAGNOSIS — E785 Hyperlipidemia, unspecified: Secondary | ICD-10-CM

## 2018-07-31 DIAGNOSIS — M159 Polyosteoarthritis, unspecified: Secondary | ICD-10-CM

## 2018-08-22 ENCOUNTER — Other Ambulatory Visit: Payer: Self-pay | Admitting: Physician Assistant

## 2018-08-22 NOTE — Telephone Encounter (Signed)
Last seen 3/19

## 2018-09-24 ENCOUNTER — Other Ambulatory Visit: Payer: Self-pay | Admitting: Physician Assistant

## 2018-09-24 DIAGNOSIS — E039 Hypothyroidism, unspecified: Secondary | ICD-10-CM

## 2018-10-18 ENCOUNTER — Telehealth (INDEPENDENT_AMBULATORY_CARE_PROVIDER_SITE_OTHER): Payer: Medicare Other | Admitting: Physician Assistant

## 2018-10-18 ENCOUNTER — Other Ambulatory Visit: Payer: Self-pay

## 2018-10-18 DIAGNOSIS — E785 Hyperlipidemia, unspecified: Secondary | ICD-10-CM | POA: Diagnosis not present

## 2018-10-18 DIAGNOSIS — I10 Essential (primary) hypertension: Secondary | ICD-10-CM

## 2018-10-18 DIAGNOSIS — M5431 Sciatica, right side: Secondary | ICD-10-CM

## 2018-10-18 DIAGNOSIS — E1169 Type 2 diabetes mellitus with other specified complication: Secondary | ICD-10-CM | POA: Diagnosis not present

## 2018-10-18 DIAGNOSIS — E039 Hypothyroidism, unspecified: Secondary | ICD-10-CM | POA: Diagnosis not present

## 2018-10-18 DIAGNOSIS — Z0000 Encounter for general adult medical examination without abnormal findings: Secondary | ICD-10-CM

## 2018-10-18 MED ORDER — LEVOTHYROXINE SODIUM 25 MCG PO TABS: ORAL_TABLET | ORAL | 3 refills | Status: DC

## 2018-10-18 MED ORDER — PREDNISONE 10 MG (48) PO TBPK: ORAL_TABLET | ORAL | 0 refills | Status: DC

## 2018-10-18 MED ORDER — ALENDRONATE SODIUM 70 MG PO TABS: ORAL_TABLET | ORAL | 3 refills | Status: DC

## 2018-10-18 NOTE — Progress Notes (Signed)
Telephone visit  Subjective: ON:GEXBMWU condition hypothyroidism, hypertension, hyperlipidemia PCP: [name] Sleeper, PA-C XLK:GMWNU [name] [name] is a 78 y.o. female calls for telephone consult today. Patient provides verbal consent for consult held via phone.  Location of patient: home Location of provider: [facility] Others present for call: no  This patient comes in for periodic recheck on medications and conditions including hypothyroidism, hypertension, hyperlipidemia.  She does have chronic medical conditions but has been doing very well.  She will come in for labs in a couple of months whenever the virus has come down in the community..   All medications are reviewed today. There are no reports of any problems with the medications. All of the medical conditions are reviewed and updated.  Lab work is reviewed and will be ordered as medically necessary.    Sciatica issues are flaring up, right side. Will go all the way to her ankle. She had this episode probably about 10 years ago.  And she remembers having the same kind of pain go down her leg.  When she puts pressure on it that is when it hurts the most.  She is not weak or falling.  And she did not have trauma to cause this.  She was wondering if we could do a prednisone pack to calm it down.  She did take prednisone in the past and it worked well.  I have encouraged her to use heat and stretching to loosen up her low back.  ROS: Per HPI  No Known Allergies Past Medical History:  Diagnosis Date  . Arthritis    Ostearthritis- hips, knees, fingers  . Breast cancer ([place]) 1997   pt denies  . Headache(784.0)    tx. Valproic acid  . Hypertension   . Hypothyroidism     Current Outpatient Medications:  .  alendronate (FOSAMAX) 70 MG tablet, TAKE 1 TABLET BY MOUTH ONCE A WEEK WITH A FULL GLASS OF WATER ON AN EMPTY STOMACH, Disp: 12 tablet, Rfl: 0 .  allopurinol (ZYLOPRIM) 100 MG tablet, TAKE 1 TABLET BY MOUTH  DAILY, Disp: 90 tablet, Rfl: 3 .   amLODipine (NORVASC) 5 MG tablet, TAKE 1 TABLET BY MOUTH  EVERY MORNING, Disp: 90 tablet, Rfl: 3 .  blood glucose meter kit and supplies KIT, Dispense based on patient and insurance preference. Use up to four times daily as directed. (FOR ICD-9 250.00, 250.01)., Disp: 1 each, Rfl: 11 .  Cholecalciferol (VITAMIN D-3) 5000 UNITS TABS, Take 5,000 tablets by mouth daily., Disp: , Rfl:  .  furosemide (LASIX) 40 MG tablet, TAKE 1 TABLET BY MOUTH IN  THE MORNING AND ONE-HALF  TABLET BY MOUTH AT NIGHT, Disp: 135 tablet, Rfl: 3 .  levothyroxine (SYNTHROID, LEVOTHROID) 25 MCG tablet, TAKE 1 TABLET BY MOUTH  DAILY BEFORE BREAKFAST, Disp: 30 tablet, Rfl: 0 .  loratadine (CLARITIN) 10 MG tablet, Take 10 mg by mouth daily., Disp: , Rfl:  .  losartan (COZAAR) 100 MG tablet, TAKE 1 TABLET BY MOUTH  EVERY MORNING, Disp: 90 tablet, Rfl: 3 .  meloxicam (MOBIC) 7.5 MG tablet, TAKE 1 TABLET BY MOUTH  DAILY, Disp: 90 tablet, Rfl: 3 .  metFORMIN (GLUCOPHAGE) 500 MG tablet, TAKE 1 TABLET BY MOUTH TWO  TIMES DAILY WITH A MEAL, Disp: 180 tablet, Rfl: 3 .  omeprazole (PRILOSEC) 20 MG capsule, TAKE 1 CAPSULE BY MOUTH 2  TIMES DAILY BEFORE MEALS, Disp: 180 capsule, Rfl: 3 .  ONE TOUCH ULTRA TEST test strip, USE UP TO FOUR TIMES DAILY AS  DIRECTED, Disp: 400 each, Rfl: 3 .  ONETOUCH DELICA LANCETS FINE MISC, USE AS DIRECTED UP TO FOUR TIMES DAILY, Disp: 400 each, Rfl: 3 .  oxybutynin (DITROPAN) 5 MG tablet, TAKE 1 TABLET BY MOUTH 3  TIMES DAILY, Disp: 270 tablet, Rfl: 3 .  simvastatin (ZOCOR) 40 MG tablet, TAKE 1 TABLET BY MOUTH  DAILY, Disp: 90 tablet, Rfl: 3  Assessment/ Plan: 78 y.o. female   1. Hypothyroidism, unspecified type - levothyroxine (SYNTHROID, LEVOTHROID) 25 MCG tablet; TAKE 1 TABLET BY MOUTH  DAILY BEFORE BREAKFAST  Dispense: 90 tablet; Refill: 3  2. Essential hypertension Continue medications  3. Hyperlipidemia associated with type 2 diabetes mellitus ([place]) Continue medication  4. Sciatica of right  side - predniSONE (STERAPRED UNI-PAK 48 TAB) 10 MG (48) TBPK tablet; Take as directed for 12 days  Dispense: 48 tablet; Refill: 0   Called telephone number [phone] at 9:23 AM and got voicemail 9:20 9:50  Start time: 9:50 End time: 10:01  No orders of the defined types were placed in this encounter.   Particia Nearing PA-C [name] [phone]

## 2019-04-02 ENCOUNTER — Other Ambulatory Visit: Payer: Self-pay

## 2019-04-02 ENCOUNTER — Encounter (HOSPITAL_COMMUNITY): Payer: Self-pay | Admitting: Emergency Medicine

## 2019-04-02 DIAGNOSIS — M1990 Unspecified osteoarthritis, unspecified site: Secondary | ICD-10-CM | POA: Diagnosis not present

## 2019-04-02 DIAGNOSIS — Z7984 Long term (current) use of oral hypoglycemic drugs: Secondary | ICD-10-CM | POA: Diagnosis not present

## 2019-04-02 DIAGNOSIS — Z96641 Presence of right artificial hip joint: Secondary | ICD-10-CM | POA: Diagnosis present

## 2019-04-02 DIAGNOSIS — K219 Gastro-esophageal reflux disease without esophagitis: Secondary | ICD-10-CM | POA: Diagnosis present

## 2019-04-02 DIAGNOSIS — Z7983 Long term (current) use of bisphosphonates: Secondary | ICD-10-CM | POA: Diagnosis not present

## 2019-04-02 DIAGNOSIS — Z808 Family history of malignant neoplasm of other organs or systems: Secondary | ICD-10-CM | POA: Diagnosis not present

## 2019-04-02 DIAGNOSIS — Z791 Long term (current) use of non-steroidal anti-inflammatories (NSAID): Secondary | ICD-10-CM

## 2019-04-02 DIAGNOSIS — Z800 Family history of malignant neoplasm of digestive organs: Secondary | ICD-10-CM

## 2019-04-02 DIAGNOSIS — E1169 Type 2 diabetes mellitus with other specified complication: Secondary | ICD-10-CM | POA: Diagnosis not present

## 2019-04-02 DIAGNOSIS — Z8249 Family history of ischemic heart disease and other diseases of the circulatory system: Secondary | ICD-10-CM

## 2019-04-02 DIAGNOSIS — R0781 Pleurodynia: Secondary | ICD-10-CM | POA: Diagnosis not present

## 2019-04-02 DIAGNOSIS — R918 Other nonspecific abnormal finding of lung field: Secondary | ICD-10-CM | POA: Diagnosis not present

## 2019-04-02 DIAGNOSIS — E785 Hyperlipidemia, unspecified: Secondary | ICD-10-CM | POA: Diagnosis not present

## 2019-04-02 DIAGNOSIS — I2694 Multiple subsegmental pulmonary emboli without acute cor pulmonale: Secondary | ICD-10-CM | POA: Diagnosis not present

## 2019-04-02 DIAGNOSIS — E1165 Type 2 diabetes mellitus with hyperglycemia: Secondary | ICD-10-CM | POA: Diagnosis present

## 2019-04-02 DIAGNOSIS — E039 Hypothyroidism, unspecified: Secondary | ICD-10-CM | POA: Diagnosis not present

## 2019-04-02 DIAGNOSIS — R5381 Other malaise: Secondary | ICD-10-CM | POA: Diagnosis not present

## 2019-04-02 DIAGNOSIS — Z853 Personal history of malignant neoplasm of breast: Secondary | ICD-10-CM | POA: Diagnosis not present

## 2019-04-02 DIAGNOSIS — Z20828 Contact with and (suspected) exposure to other viral communicable diseases: Secondary | ICD-10-CM | POA: Diagnosis present

## 2019-04-02 DIAGNOSIS — I2699 Other pulmonary embolism without acute cor pulmonale: Secondary | ICD-10-CM | POA: Diagnosis not present

## 2019-04-02 DIAGNOSIS — Z23 Encounter for immunization: Secondary | ICD-10-CM | POA: Diagnosis not present

## 2019-04-02 DIAGNOSIS — Z79890 Hormone replacement therapy (postmenopausal): Secondary | ICD-10-CM

## 2019-04-02 DIAGNOSIS — R079 Chest pain, unspecified: Secondary | ICD-10-CM | POA: Diagnosis not present

## 2019-04-02 DIAGNOSIS — R0602 Shortness of breath: Secondary | ICD-10-CM | POA: Diagnosis not present

## 2019-04-02 DIAGNOSIS — I10 Essential (primary) hypertension: Secondary | ICD-10-CM | POA: Diagnosis present

## 2019-04-02 NOTE — ED Triage Notes (Signed)
Patient brought in by EMS for complaint of right rib "spasms" that started today. Denies injury.

## 2019-04-03 ENCOUNTER — Other Ambulatory Visit: Payer: Self-pay

## 2019-04-03 ENCOUNTER — Inpatient Hospital Stay (HOSPITAL_COMMUNITY): Payer: Medicare Other

## 2019-04-03 ENCOUNTER — Emergency Department (HOSPITAL_COMMUNITY): Payer: Medicare Other

## 2019-04-03 ENCOUNTER — Encounter (HOSPITAL_COMMUNITY): Payer: Self-pay | Admitting: Family Medicine

## 2019-04-03 ENCOUNTER — Inpatient Hospital Stay (HOSPITAL_COMMUNITY)
Admission: EM | Admit: 2019-04-03 | Discharge: 2019-04-04 | DRG: 176 | Disposition: A | Discharge status: Home / Self Care | Payer: Medicare Other | Attending: Family Medicine | Admitting: Family Medicine

## 2019-04-03 DIAGNOSIS — I10 Essential (primary) hypertension: Secondary | ICD-10-CM | POA: Diagnosis not present

## 2019-04-03 DIAGNOSIS — Z7983 Long term (current) use of bisphosphonates: Secondary | ICD-10-CM | POA: Diagnosis not present

## 2019-04-03 DIAGNOSIS — I2699 Other pulmonary embolism without acute cor pulmonale: Secondary | ICD-10-CM

## 2019-04-03 DIAGNOSIS — E039 Hypothyroidism, unspecified: Secondary | ICD-10-CM | POA: Diagnosis present

## 2019-04-03 DIAGNOSIS — E119 Type 2 diabetes mellitus without complications: Secondary | ICD-10-CM

## 2019-04-03 DIAGNOSIS — Z79890 Hormone replacement therapy (postmenopausal): Secondary | ICD-10-CM | POA: Diagnosis not present

## 2019-04-03 DIAGNOSIS — Z853 Personal history of malignant neoplasm of breast: Secondary | ICD-10-CM | POA: Diagnosis not present

## 2019-04-03 DIAGNOSIS — R918 Other nonspecific abnormal finding of lung field: Secondary | ICD-10-CM | POA: Diagnosis not present

## 2019-04-03 DIAGNOSIS — N1832 Chronic kidney disease, stage 3b: Secondary | ICD-10-CM

## 2019-04-03 DIAGNOSIS — E785 Hyperlipidemia, unspecified: Secondary | ICD-10-CM | POA: Diagnosis present

## 2019-04-03 DIAGNOSIS — I82411 Acute embolism and thrombosis of right femoral vein: Secondary | ICD-10-CM | POA: Diagnosis not present

## 2019-04-03 DIAGNOSIS — Z808 Family history of malignant neoplasm of other organs or systems: Secondary | ICD-10-CM | POA: Diagnosis not present

## 2019-04-03 DIAGNOSIS — I2694 Multiple subsegmental pulmonary emboli without acute cor pulmonale: Secondary | ICD-10-CM

## 2019-04-03 DIAGNOSIS — M1990 Unspecified osteoarthritis, unspecified site: Secondary | ICD-10-CM | POA: Diagnosis present

## 2019-04-03 DIAGNOSIS — Z800 Family history of malignant neoplasm of digestive organs: Secondary | ICD-10-CM | POA: Diagnosis not present

## 2019-04-03 DIAGNOSIS — Z7984 Long term (current) use of oral hypoglycemic drugs: Secondary | ICD-10-CM | POA: Diagnosis not present

## 2019-04-03 DIAGNOSIS — E1165 Type 2 diabetes mellitus with hyperglycemia: Secondary | ICD-10-CM | POA: Diagnosis present

## 2019-04-03 DIAGNOSIS — Z791 Long term (current) use of non-steroidal anti-inflammatories (NSAID): Secondary | ICD-10-CM | POA: Diagnosis not present

## 2019-04-03 DIAGNOSIS — Z23 Encounter for immunization: Secondary | ICD-10-CM | POA: Diagnosis not present

## 2019-04-03 DIAGNOSIS — K219 Gastro-esophageal reflux disease without esophagitis: Secondary | ICD-10-CM | POA: Diagnosis present

## 2019-04-03 DIAGNOSIS — E1169 Type 2 diabetes mellitus with other specified complication: Secondary | ICD-10-CM | POA: Diagnosis present

## 2019-04-03 DIAGNOSIS — I152 Hypertension secondary to endocrine disorders: Secondary | ICD-10-CM | POA: Diagnosis present

## 2019-04-03 DIAGNOSIS — Z20828 Contact with and (suspected) exposure to other viral communicable diseases: Secondary | ICD-10-CM | POA: Diagnosis present

## 2019-04-03 DIAGNOSIS — R079 Chest pain, unspecified: Secondary | ICD-10-CM | POA: Diagnosis present

## 2019-04-03 DIAGNOSIS — Z96641 Presence of right artificial hip joint: Secondary | ICD-10-CM | POA: Diagnosis present

## 2019-04-03 DIAGNOSIS — Z8249 Family history of ischemic heart disease and other diseases of the circulatory system: Secondary | ICD-10-CM | POA: Diagnosis not present

## 2019-04-03 LAB — BASIC METABOLIC PANEL
Anion gap: 9 (ref 5–15)
BUN: 19 mg/dL (ref 8–23)
CO2: 25 mmol/L (ref 22–32)
Calcium: 10.4 mg/dL — ABNORMAL HIGH (ref 8.9–10.3)
Chloride: 104 mmol/L (ref 98–111)
Creatinine, Ser: 0.95 mg/dL (ref 0.44–1.00)
GFR calc Af Amer: >60 mL/min (ref >60)
GFR calc non Af Amer: 58 mL/min — ABNORMAL LOW (ref >60)
Glucose, Bld: 130 mg/dL — ABNORMAL HIGH (ref 70–99)
Potassium: 4 mmol/L (ref 3.5–5.1)
Sodium: 138 mmol/L (ref 135–145)

## 2019-04-03 LAB — CBC
HCT: 35.4 % — ABNORMAL LOW (ref 36.0–46.0)
HCT: 37.4 % (ref 36.0–46.0)
Hemoglobin: 10.9 g/dL — ABNORMAL LOW (ref 12.0–15.0)
Hemoglobin: 10.9 g/dL — ABNORMAL LOW (ref 12.0–15.0)
MCH: 23.6 pg — ABNORMAL LOW (ref 26.0–34.0)
MCH: 24.7 pg — ABNORMAL LOW (ref 26.0–34.0)
MCHC: 29.1 g/dL — ABNORMAL LOW (ref 30.0–36.0)
MCHC: 30.8 g/dL (ref 30.0–36.0)
MCV: 80.1 fL (ref 80.0–100.0)
MCV: 81 fL (ref 80.0–100.0)
Platelets: 237 10*3/uL (ref 150–400)
Platelets: 250 10*3/uL (ref 150–400)
RBC: 4.42 MIL/uL (ref 3.87–5.11)
RBC: 4.62 MIL/uL (ref 3.87–5.11)
RDW: 17.9 % — ABNORMAL HIGH (ref 11.5–15.5)
RDW: 18.1 % — ABNORMAL HIGH (ref 11.5–15.5)
WBC: 11.5 10*3/uL — ABNORMAL HIGH (ref 4.0–10.5)
WBC: 9.7 10*3/uL (ref 4.0–10.5)
nRBC: 0 % (ref 0.0–0.2)
nRBC: 0 % (ref 0.0–0.2)

## 2019-04-03 LAB — COMPREHENSIVE METABOLIC PANEL
ALT: 17 U/L (ref 0–44)
AST: 13 U/L — ABNORMAL LOW (ref 15–41)
Albumin: 4.2 g/dL (ref 3.5–5.0)
Alkaline Phosphatase: 102 U/L (ref 38–126)
Anion gap: 8 (ref 5–15)
BUN: 16 mg/dL (ref 8–23)
CO2: 24 mmol/L (ref 22–32)
Calcium: 10.1 mg/dL (ref 8.9–10.3)
Chloride: 104 mmol/L (ref 98–111)
Creatinine, Ser: 0.71 mg/dL (ref 0.44–1.00)
GFR calc Af Amer: >60 mL/min (ref >60)
GFR calc non Af Amer: >60 mL/min (ref >60)
Glucose, Bld: 124 mg/dL — ABNORMAL HIGH (ref 70–99)
Potassium: 4 mmol/L (ref 3.5–5.1)
Sodium: 136 mmol/L (ref 135–145)
Total Bilirubin: 0.7 mg/dL (ref 0.3–1.2)
Total Protein: 7.2 g/dL (ref 6.5–8.1)

## 2019-04-03 LAB — APTT: aPTT: 198 seconds (ref 24–36)

## 2019-04-03 LAB — D-DIMER, QUANTITATIVE: D-Dimer, Quant: 5.62 ug/mL-FEU — ABNORMAL HIGH (ref 0.00–0.50)

## 2019-04-03 LAB — GLUCOSE, CAPILLARY
Glucose-Capillary: 105 mg/dL — ABNORMAL HIGH (ref 70–99)
Glucose-Capillary: 116 mg/dL — ABNORMAL HIGH (ref 70–99)

## 2019-04-03 LAB — SARS CORONAVIRUS 2 BY RT PCR (HOSPITAL ORDER, PERFORMED IN ~~LOC~~ HOSPITAL LAB): SARS Coronavirus 2: NEGATIVE

## 2019-04-03 LAB — HEPARIN LEVEL (UNFRACTIONATED)
Heparin Unfractionated: 0.59 IU/mL (ref 0.30–0.70)
Heparin Unfractionated: 0.25 IU/mL — ABNORMAL LOW (ref 0.30–0.70)

## 2019-04-03 LAB — TROPONIN I (HIGH SENSITIVITY)
Troponin I (High Sensitivity): 6 ng/L (ref <18)
Troponin I (High Sensitivity): 8 ng/L (ref <18)

## 2019-04-03 LAB — PROTIME-INR
INR: 1.2 (ref 0.8–1.2)
Prothrombin Time: 15.2 seconds (ref 11.4–15.2)

## 2019-04-03 LAB — ECHOCARDIOGRAM COMPLETE

## 2019-04-03 LAB — TSH: TSH: 1.789 u[IU]/mL (ref 0.350–4.500)

## 2019-04-03 MED ORDER — TRAMADOL HCL 50 MG PO TABS
50.0000 mg | ORAL_TABLET | Freq: Four times a day (QID) | ORAL | Status: DC | PRN
  Administered 2019-04-03: 50 mg via ORAL
  Filled 2019-04-03: qty 1

## 2019-04-03 MED ORDER — FENTANYL CITRATE (PF) 100 MCG/2ML IJ SOLN
25.0000 ug | Freq: Once | INTRAMUSCULAR | Status: AC
  Administered 2019-04-03: 25 ug via INTRAVENOUS
  Filled 2019-04-03: qty 2

## 2019-04-03 MED ORDER — ACETAMINOPHEN 325 MG PO TABS: 650.0000 mg | ORAL_TABLET | Freq: Four times a day (QID) | ORAL | Status: DC | PRN

## 2019-04-03 MED ORDER — INFLUENZA VAC A&B SA ADJ QUAD 0.5 ML IM PRSY
0.5000 mL | PREFILLED_SYRINGE | INTRAMUSCULAR | Status: AC
  Administered 2019-04-04: 12:00:00 0.5 mL via INTRAMUSCULAR
  Filled 2019-04-03: qty 0.5

## 2019-04-03 MED ORDER — AMLODIPINE BESYLATE 5 MG PO TABS
5.0000 mg | ORAL_TABLET | Freq: Every morning | ORAL | Status: DC
  Administered 2019-04-03 – 2019-04-04 (×2): 5 mg via ORAL
  Filled 2019-04-03 (×2): qty 1

## 2019-04-03 MED ORDER — INSULIN ASPART 100 UNIT/ML ~~LOC~~ SOLN: 0.0000 [IU] | Freq: Three times a day (TID) | SUBCUTANEOUS | Status: DC

## 2019-04-03 MED ORDER — FUROSEMIDE 20 MG PO TABS
20.0000 mg | ORAL_TABLET | Freq: Every day | ORAL | Status: DC
  Administered 2019-04-03 – 2019-04-04 (×2): 20 mg via ORAL
  Filled 2019-04-03 (×2): qty 1

## 2019-04-03 MED ORDER — INSULIN ASPART 100 UNIT/ML ~~LOC~~ SOLN: 0.0000 [IU] | Freq: Every day | SUBCUTANEOUS | Status: DC

## 2019-04-03 MED ORDER — ACETAMINOPHEN 650 MG RE SUPP: 650.0000 mg | Freq: Four times a day (QID) | RECTAL | Status: DC | PRN

## 2019-04-03 MED ORDER — PANTOPRAZOLE SODIUM 40 MG PO TBEC
40.0000 mg | DELAYED_RELEASE_TABLET | Freq: Every day | ORAL | Status: DC
  Administered 2019-04-03 – 2019-04-04 (×2): 40 mg via ORAL
  Filled 2019-04-03 (×2): qty 1

## 2019-04-03 MED ORDER — ATORVASTATIN CALCIUM 20 MG PO TABS
20.0000 mg | ORAL_TABLET | Freq: Every day | ORAL | Status: DC
  Administered 2019-04-03: 20 mg via ORAL
  Filled 2019-04-03: qty 1

## 2019-04-03 MED ORDER — IOHEXOL 350 MG/ML SOLN
100.0000 mL | Freq: Once | INTRAVENOUS | Status: AC | PRN
  Administered 2019-04-03: 100 mL via INTRAVENOUS

## 2019-04-03 MED ORDER — ALLOPURINOL 100 MG PO TABS
100.0000 mg | ORAL_TABLET | Freq: Every day | ORAL | Status: DC
  Administered 2019-04-03 – 2019-04-04 (×2): 100 mg via ORAL
  Filled 2019-04-03 (×2): qty 1

## 2019-04-03 MED ORDER — HEPARIN BOLUS VIA INFUSION
5000.0000 [IU] | Freq: Once | INTRAVENOUS | Status: AC
  Administered 2019-04-03: 5000 [IU] via INTRAVENOUS

## 2019-04-03 MED ORDER — LOSARTAN POTASSIUM 50 MG PO TABS
100.0000 mg | ORAL_TABLET | Freq: Every morning | ORAL | Status: DC
  Administered 2019-04-03 – 2019-04-04 (×2): 100 mg via ORAL
  Filled 2019-04-03 (×2): qty 2

## 2019-04-03 MED ORDER — SIMVASTATIN 20 MG PO TABS: 40.0000 mg | ORAL_TABLET | Freq: Every day | ORAL | Status: DC

## 2019-04-03 MED ORDER — SENNOSIDES-DOCUSATE SODIUM 8.6-50 MG PO TABS: 1.0000 | ORAL_TABLET | Freq: Every evening | ORAL | Status: DC | PRN

## 2019-04-03 MED ORDER — LEVOTHYROXINE SODIUM 25 MCG PO TABS
25.0000 ug | ORAL_TABLET | Freq: Every day | ORAL | Status: DC
  Administered 2019-04-03 – 2019-04-04 (×2): 25 ug via ORAL
  Filled 2019-04-03 (×2): qty 1

## 2019-04-03 MED ORDER — HEPARIN (PORCINE) 25000 UT/250ML-% IV SOLN
1400.0000 [IU]/h | INTRAVENOUS | Status: DC
  Administered 2019-04-03: 1250 [IU]/h via INTRAVENOUS
  Administered 2019-04-03: 1400 [IU]/h via INTRAVENOUS
  Filled 2019-04-03 (×2): qty 250

## 2019-04-03 NOTE — H&P (Signed)
TRH H&P    Patient Demographics:    [name] [name], is a 78 y.o. female  MRN: [id number]  DOB - 05/12/1941  Admit Date - 04/03/2019  Referring MD/NP/PA: Dr. Wyvonnia Dusky  Outpatient Primary MD for the patient is [name] Sleeper, PA-C  Patient coming from: Home  Chief complaint-right-sided pain   HPI:    [name] [name]  is a 78 y.o. female with history of hypothyroidism, hypertension, breast cancer, and arthritis presents today with right-sided pain.  Patient reports that the pain started at 7:30 PM.  She said it was sudden and stabbing.  It was all over her right flank and side.  And she came into the ER primarily for that reason.  Upon further questioning she does say that she had been feeling overly fatigued for the last week, some shortness of breath, some feelings like she was going to pass out.  She had had a dry cough, no hemoptysis.  Patient has not had any recent surgeries, she is not on any hormone supplementation.  She does not have active cancer.  Patient does report that she has been trying to stay home because of the pandemic and she thinks she has been overly sedentary.  Patient is never had a blood clot before.  Patient does not know of any blood clots running in her family.  In the ED CT angios showed a moderately sized PE in the right lower lobe pulmonary arteries.  Peripheral groundglass opacity in right lower lobe could indicate an early pulmonary infarct.  Patient had elevated d-dimer.  Patient had stable tropes.  Patient is being admitted for further work-up and management of pulmonary embolism.  Of note patient did have sinus pauses on telemetry in ED for this reason she will be kept on telemetry.    Review of systems:    In addition to the HPI above,  No Fever-chills, No Headache, No changes with Vision or hearing, No problems swallowing food or Liquids, Endorses cough and shortness of breath No  Abdominal pain, No Nausea or Vomiting, bowel movements are regular, No Blood in stool or Urine, No dysuria, No new skin rashes or bruises, No new joints pains-aches,  No new weakness, tingling, numbness in any extremity, No recent weight gain or loss, No polyuria, polydypsia or polyphagia, No significant Mental Stressors.  All other systems reviewed and are negative.    Past History of the following :    Past Medical History:  Diagnosis Date   Arthritis    Ostearthritis- hips, knees, fingers   Breast cancer ([place]) 1997   pt denies   Headache(784.0)    tx. Valproic acid   Hypertension    Hypothyroidism       Past Surgical History:  Procedure Laterality Date   ABDOMINAL HYSTERECTOMY     CATARACT EXTRACTION, BILATERAL     JOINT REPLACEMENT Left    7'2012   MULTIPLE TOOTH EXTRACTIONS     60's   PARATHYROIDECTOMY     TOTAL HIP ARTHROPLASTY Right 10/29/2013   Procedure: RIGHT TOTAL HIP ARTHROPLASTY  ANTERIOR APPROACH;  Surgeon: Mauri Pole, MD;  Location: [facility];  Service: Orthopedics;  Laterality: Right;      Social History:      Social History   Tobacco Use   Smoking status: Never Smoker   Smokeless tobacco: Never Used  Substance Use Topics   Alcohol use: No    Alcohol/week: 0.0 standard drinks       Family History :     Family History  Problem Relation Age of Onset   Colitis Sister 90       alive   Cancer Sister 36       colon   Cancer Mother 35       uterine   Heart disease Father 46       heart failure   Heart attack Brother    Healthy Daughter    Healthy Son    Pulmonary embolism Brother    Heart disease Brother    Healthy Son    Healthy Son       Home Medications:   Prior to Admission medications   Medication Sig Start Date End Date Taking? Authorizing Provider  alendronate (FOSAMAX) 70 MG tablet TAKE 1 TABLET BY MOUTH ONCE A WEEK WITH A FULL GLASS OF WATER ON AN EMPTY STOMACH 10/18/18   [name] Sleeper, PA-C   allopurinol (ZYLOPRIM) 100 MG tablet TAKE 1 TABLET BY MOUTH  DAILY 07/31/18   [name] Sleeper, PA-C  amLODipine (NORVASC) 5 MG tablet TAKE 1 TABLET BY MOUTH  EVERY MORNING 07/31/18   [name] Sleeper, PA-C  blood glucose meter kit and supplies KIT Dispense based on patient and insurance preference. Use up to four times daily as directed. (FOR ICD-9 250.00, 250.01). 06/21/16   [name] Sleeper, PA-C  Cholecalciferol (VITAMIN D-3) 5000 UNITS TABS Take 5,000 tablets by mouth daily.    [provider]  furosemide (LASIX) 40 MG tablet TAKE 1 TABLET BY MOUTH IN  THE MORNING AND ONE-HALF  TABLET BY MOUTH AT NIGHT 07/31/18   [name] Sleeper, PA-C  levothyroxine (SYNTHROID, LEVOTHROID) 25 MCG tablet TAKE 1 TABLET BY MOUTH  DAILY BEFORE BREAKFAST 10/18/18   [name] Sleeper, PA-C  loratadine (CLARITIN) 10 MG tablet Take 10 mg by mouth daily.    [provider]  losartan (COZAAR) 100 MG tablet TAKE 1 TABLET BY MOUTH  EVERY MORNING 07/31/18   [name] Sleeper, PA-C  meloxicam (MOBIC) 7.5 MG tablet TAKE 1 TABLET BY MOUTH  DAILY 07/31/18   [name] Sleeper, PA-C  metFORMIN (GLUCOPHAGE) 500 MG tablet TAKE 1 TABLET BY MOUTH TWO  TIMES DAILY WITH A MEAL 07/31/18   [name] Sleeper, PA-C  omeprazole (PRILOSEC) 20 MG capsule TAKE 1 CAPSULE BY MOUTH 2  TIMES DAILY BEFORE MEALS 07/31/18   [name] Sleeper, PA-C  ONE TOUCH ULTRA TEST test strip USE UP TO FOUR TIMES DAILY AS DIRECTED 10/04/17   Ronnald Ramp, Londell Moh, PA-C  ONETOUCH DELICA LANCETS FINE MISC USE AS DIRECTED UP TO FOUR TIMES DAILY 10/04/17   [name] Sleeper, PA-C  oxybutynin (DITROPAN) 5 MG tablet TAKE 1 TABLET BY MOUTH 3  TIMES DAILY 07/31/18   [name] Sleeper, PA-C  predniSONE (STERAPRED UNI-PAK 48 TAB) 10 MG (48) TBPK tablet Take as directed for 12 days 10/18/18   [name] Sleeper, PA-C  simvastatin (ZOCOR) 40 MG tablet TAKE 1 TABLET BY MOUTH  DAILY 07/31/18   [name] Sleeper, PA-C     Allergies:  No Known Allergies   Physical Exam:   Vitals  Blood pressure (!) 168/63,  pulse 87, temperature 99.2 F (37.3 C), resp. rate (!) 33, height 5' 3"  (1.6 m), weight 105.2 kg, SpO2 95 %.  1.  General: Patient sitting in bedside chair in no acute distress holding a pillow to her right side  2. Psychiatric: Alert and oriented x3 pleasant and cooperative with exam  3. Neurologic: Cranial nerves II through XII are grossly intact no focal deficits on limited exam  4. HEENMT:  Head is atraumatic normocephalic pupils are reactive to light neck has no masses trachea is midline  5. Respiratory : Lungs are clear to auscultation bilaterally  6. Cardiovascular : Heart rate is normal rhythm is irregular, no murmurs rubs or gallops  7. Gastrointestinal:  Abdomen is soft nondistended nontender to palpation  8. Skin:  Skin shows no acute changes on limited skin exam  9.Musculoskeletal:  No peripheral edema    Data Review:    CBC Recent Labs  Lab 04/03/19 0001  WBC 11.5*  HGB 10.9*  HCT 37.4  PLT 250  MCV 81.0  MCH 23.6*  MCHC 29.1*  RDW 18.1*   ------------------------------------------------------------------------------------------------------------------  Results for orders placed or performed during the hospital encounter of 04/03/19 (from the past 48 hour(s))  Basic metabolic panel     Status: Abnormal   Collection Time: 04/03/19 12:01 AM  Result Value Ref Range   Sodium 138 135 - 145 mmol/L   Potassium 4.0 3.5 - 5.1 mmol/L   Chloride 104 98 - 111 mmol/L   CO2 25 22 - 32 mmol/L   Glucose, Bld 130 (H) 70 - 99 mg/dL   BUN 19 8 - 23 mg/dL   Creatinine, Ser 0.95 0.44 - 1.00 mg/dL   Calcium 10.4 (H) 8.9 - 10.3 mg/dL   GFR calc non Af Amer 58 (L) >60 mL/min   GFR calc Af Amer >60 >60 mL/min   Anion gap 9 5 - 15    Comment: Performed at [facility], [address] [place], [place] [address]  CBC     Status: Abnormal   Collection Time: 04/03/19 12:01 AM  Result Value Ref Range   WBC 11.5 (H) 4.0 - 10.5 K/uL   RBC 4.62 3.87 - 5.11 MIL/uL    Hemoglobin 10.9 (L) 12.0 - 15.0 g/dL   HCT 37.4 36.0 - 46.0 %   MCV 81.0 80.0 - 100.0 fL   MCH 23.6 (L) 26.0 - 34.0 pg   MCHC 29.1 (L) 30.0 - 36.0 g/dL   RDW 18.1 (H) 11.5 - 15.5 %   Platelets 250 150 - 400 K/uL   nRBC 0.0 0.0 - 0.2 %    Comment: Performed at [facility], [address], [place], [place] [address]  Troponin I (High Sensitivity)     Status: None   Collection Time: 04/03/19 12:01 AM  Result Value Ref Range   Troponin I (High Sensitivity) 6 <18 ng/L    Comment: (NOTE) Elevated high sensitivity troponin I (hsTnI) values and significant  changes across serial measurements may suggest ACS but many other  chronic and acute conditions are known to elevate hsTnI results.  Refer to the "Links" section for chest pain algorithms and additional  guidance. Performed at [facility], [address], [place],  [address]   D-dimer, quantitative (not at [facility])     Status: Abnormal   Collection Time: 04/03/19 12:01 AM  Result Value Ref Range   D-Dimer, Quant 5.62 (  H) 0.00 - 0.50 ug/mL-FEU    Comment: (NOTE) At the manufacturer cut-off of 0.50 ug/mL FEU, this assay has been documented to exclude PE with a sensitivity and negative predictive value of 97 to 99%.  At this time, this assay has not been approved by the FDA to exclude DVT/VTE. Results should be correlated with clinical presentation. Performed at [facility], [address] [place], [place] [address]   SARS Coronavirus 2 [facility] order, Performed in [facility] hospital lab) Nasopharyngeal Nasopharyngeal Swab     Status: None   Collection Time: 04/03/19  1:01 AM   Specimen: Nasopharyngeal Swab  Result Value Ref Range   SARS Coronavirus 2 NEGATIVE NEGATIVE    Comment: (NOTE) If result is NEGATIVE SARS-CoV-2 target nucleic acids are NOT DETECTED. The SARS-CoV-2 RNA is generally detectable in upper and lower  respiratory specimens during the acute phase of infection. The lowest  concentration of SARS-CoV-2 viral  copies this assay can detect is 250  copies / mL. A negative result does not preclude SARS-CoV-2 infection  and should not be used as the sole basis for treatment or other  patient management decisions.  A negative result may occur with  improper specimen collection / handling, submission of specimen other  than nasopharyngeal swab, presence of viral mutation(s) within the  areas targeted by this assay, and inadequate number of viral copies  (<250 copies / mL). A negative result must be combined with clinical  observations, patient history, and epidemiological information. If result is POSITIVE SARS-CoV-2 target nucleic acids are DETECTED. The SARS-CoV-2 RNA is generally detectable in upper and lower  respiratory specimens dur ing the acute phase of infection.  Positive  results are indicative of active infection with SARS-CoV-2.  Clinical  correlation with patient history and other diagnostic information is  necessary to determine patient infection status.  Positive results do  not rule out bacterial infection or co-infection with other viruses. If result is PRESUMPTIVE POSTIVE SARS-CoV-2 nucleic acids MAY BE PRESENT.   A presumptive positive result was obtained on the submitted specimen  and confirmed on repeat testing.  While 2019 novel coronavirus  (SARS-CoV-2) nucleic acids may be present in the submitted sample  additional confirmatory testing may be necessary for epidemiological  and / or clinical management purposes  to differentiate between  SARS-CoV-2 and other Sarbecovirus currently known to infect humans.  If clinically indicated additional testing with an alternate test  methodology [phone]) is advised. The SARS-CoV-2 RNA is generally  detectable in upper and lower respiratory sp ecimens during the acute  phase of infection. The expected result is Negative. Fact Sheet for Patients:  StrictlyIdeas.no Fact Sheet for Healthcare  Providers: BankingDealers.co.za This test is not yet approved or cleared by the [place] FDA and has been authorized for detection and/or diagnosis of SARS-CoV-2 by FDA under an Emergency Use Authorization (EUA).  This EUA will remain in effect (meaning this test can be used) for the duration of the COVID-19 declaration under Section 564(b)(1) of the Act, 21 U.S.C. section 360bbb-3(b)(1), unless the authorization is terminated or revoked sooner. Performed at [facility], [address], [place], [place] [address]   Troponin I (High Sensitivity)     Status: None   Collection Time: 04/03/19  2:00 AM  Result Value Ref Range   Troponin I (High Sensitivity) 8 <18 ng/L    Comment: (NOTE) Elevated high sensitivity troponin I (hsTnI) values and significant  changes across serial measurements may suggest ACS but many other  chronic and acute conditions are known to elevate hsTnI results.  Refer to the "Links" section for chest pain algorithms and additional  guidance. Performed at [facility], [address], [place], [place] [address]   APTT     Status: Abnormal   Collection Time: 04/03/19  4:45 AM  Result Value Ref Range   aPTT 198 (HH) 24 - 36 seconds    Comment:        IF BASELINE aPTT IS ELEVATED, SUGGEST PATIENT RISK ASSESSMENT BE USED TO DETERMINE APPROPRIATE ANTICOAGULANT THERAPY. REPEATED TO VERIFY CRITICAL RESULT CALLED TO, READ BACK BY AND VERIFIED WITH: L ANDREW,RN @0548  04/03/19 [facility] Performed at [facility], [address], [place], [place] [address]   Protime-INR     Status: None   Collection Time: 04/03/19  4:45 AM  Result Value Ref Range   Prothrombin Time 15.2 11.4 - 15.2 seconds   INR 1.2 0.8 - 1.2    Comment: (NOTE) INR goal varies based on device and disease states. Performed at [facility], [address], [place], [place] [address]     Chemistries  Recent Labs  Lab 04/03/19 0001  NA 138  K 4.0  CL 104  CO2 25  GLUCOSE  130*  BUN 19  CREATININE 0.95  CALCIUM 10.4*   ------------------------------------------------------------------------------------------------------------------  ------------------------------------------------------------------------------------------------------------------ GFR: Estimated Creatinine Clearance: 57.5 mL/min (by C-G formula based on SCr of 0.95 mg/dL). Liver Function Tests: No results for input(s): AST, ALT, ALKPHOS, BILITOT, PROT, ALBUMIN in the last 168 hours. No results for input(s): LIPASE, AMYLASE in the last 168 hours. No results for input(s): AMMONIA in the last 168 hours. Coagulation Profile: Recent Labs  Lab 04/03/19 0445  INR 1.2   Cardiac Enzymes: No results for input(s): CKTOTAL, CKMB, CKMBINDEX, TROPONINI in the last 168 hours. BNP (last 3 results) No results for input(s): PROBNP in the last 8760 hours. HbA1C: No results for input(s): HGBA1C in the last 72 hours. CBG: No results for input(s): GLUCAP in the last 168 hours. Lipid Profile: No results for input(s): CHOL, HDL, LDLCALC, TRIG, CHOLHDL, LDLDIRECT in the last 72 hours. Thyroid Function Tests: No results for input(s): TSH, T4TOTAL, FREET4, T3FREE, THYROIDAB in the last 72 hours. Anemia Panel: No results for input(s): VITAMINB12, FOLATE, FERRITIN, TIBC, IRON, RETICCTPCT in the last 72 hours.  --------------------------------------------------------------------------------------------------------------- Urine analysis:    Component Value Date/Time   COLORURINE YELLOW 10/22/2013 1127   APPEARANCEUR CLOUDY (A) 10/22/2013 1127   LABSPEC 1.016 10/22/2013 1127   PHURINE 5.0 10/22/2013 1127   GLUCOSEU NEGATIVE 10/22/2013 1127   HGBUR NEGATIVE 10/22/2013 1127   BILIRUBINUR NEGATIVE 10/22/2013 1127   KETONESUR NEGATIVE 10/22/2013 1127   PROTEINUR NEGATIVE 10/22/2013 1127   UROBILINOGEN 0.2 10/22/2013 1127   NITRITE NEGATIVE 10/22/2013 1127   LEUKOCYTESUR SMALL (A) 10/22/2013 1127       Imaging Results:    Dg Chest 2 View  Result Date: 04/03/2019 CLINICAL DATA:  Right rib pain EXAM: CHEST - 2 VIEW COMPARISON:  10/22/2013 FINDINGS: Heart is normal size. Bibasilar opacities, left greater than right. No effusions. No acute bony abnormality. IMPRESSION: Bibasilar airspace opacities, left greater than right could reflect atelectasis or infiltrates. Electronically Signed   By: Rolm Baptise M.D.   On: 04/03/2019 00:40   Ct Angio Chest Pe W And/or Wo Contrast  Result Date: 04/03/2019 CLINICAL DATA:  Right chest pain positive D-dimer EXAM: CT ANGIOGRAPHY CHEST WITH CONTRAST TECHNIQUE: Multidetector CT imaging of the chest was performed using the standard protocol during bolus  administration of intravenous contrast. Multiplanar CT image reconstructions and MIPs were obtained to evaluate the vascular anatomy. CONTRAST:  154m OMNIPAQUE IOHEXOL 350 MG/ML SOLN COMPARISON:  Chest x-ray earlier today FINDINGS: Cardiovascular: Filling defects are noted in the right lower lobe pulmonary arteries compatible with pulmonary emboli. No filling defects on the left. No evidence of right heart strain. Moderate aortic atherosclerosis. Cardiomegaly. Mediastinum/Nodes: No mediastinal, hilar, or axillary adenopathy. Trachea and esophagus are unremarkable. Lungs/Pleura: Ground-glass airspace opacity in the right lower lung could reflect early pulmonary infarct. Linear areas of atelectasis or scarring in both lung bases. No effusions. Upper Abdomen: Imaging into the upper abdomen shows no acute findings. Musculoskeletal: Chest wall soft tissues are unremarkable. No acute bony abnormality. Review of the MIP images confirms the above findings. IMPRESSION: Moderate size pulmonary emboli in the right lower lobe pulmonary arteries. Peripheral ground-glass opacity in the right lower lobe could reflect early pulmonary infarct. Cardiomegaly. Bibasilar scarring or atelectasis. Aortic Atherosclerosis (ICD10-I70.0). These results  were called by telephone at the time of interpretation on 04/03/2019 at 3:55 am to [facility], who verbally acknowledged these results. Electronically Signed   By: KRolm BaptiseM.D.   On: 04/03/2019 03:56    My personal review of EKG: Rhythm NSR, Rate 89/min, QTc 466 ,no Acute ST changes   Assessment & Plan:    Active Problems:   [place]ulmonary [place] ([place]   1. Pulmonary embolism 1. Heparin drip started, also discussed p.o. options for patient for her to think about for discharge 2. Ultrasound bilateral lower extremities to evaluate clot burden 3. Echo to evaluate right heart strain 4. Continue monitor on telemetry 5. Oxygen therapy as needed 2. Sinus pause 1. Insignificant so far 2. Patient does not feel palpitations 3. Continue to monitor on telemetry 3. Hyperglycemia 1. Low-carb diet started 2. Continue to monitor with daily CMP 3. Consider adding sliding scale if glucose remains elevated 4. Leukocytosis 1. Acute phase reactant 2. Continue to monitor daily CBC 3. If trending up consider an infectious work-up as well   DVT Prophylaxis-   full dose heparin SCDs   AM Labs Ordered, also please review Full Orders  Family Communication: Admission, patients condition and plan of care including tests being ordered have been discussed with the patient and daughter who indicate understanding and agree with the plan and Code Status.  Code Status: Full  Admission status: Inpatient: Based on patients clinical presentation and evaluation of above clinical data, I have made determination that patient meets Inpatient criteria at this time.  Time spent in minutes : 65   ARolla PlateM.D on 04/03/2019 at 5:55 AM

## 2019-04-03 NOTE — ED Notes (Signed)
Date and time results received: 04/03/19 0548  Test: aPTT Critical Value: 198  Name of Provider Notified: Rancour  Orders Received? Or Actions Taken?: n/a

## 2019-04-03 NOTE — Progress Notes (Signed)
*  PRELIMINARY RESULTS* Echocardiogram 2D Echocardiogram has been performed.  [name] [name] 04/03/2019, 2:01 PM

## 2019-04-03 NOTE — ED Provider Notes (Signed)
[name] [name] - [facility] EMERGENCY DEPARTMENT Provider Note   CSN: [id number] Arrival date & time: 04/02/19  2124     History   Chief Complaint Chief Complaint  Patient presents with  . Chest Pain    HPI [name] [name] is a 78 y.o. female.     Patient here with sudden onset right-sided rib and flank pain that onset while she was watching television around 7 or 8 PM.  She describes the pain as a "spasm that comes and goes lasting for several minutes at a time.  Is worse with deep breathing and she feels like she cannot get a deep breath.  Is also worse with position change and movement.  Denies any falls or trauma.  Denies any recent cough or fever.  Denies any history of COPD or asthma.  Denies any cardiac history.  Denies any history of DVT, PE or cancer.  She has never had this kind of pain in the past.  It is somewhat improved since arrival.  No pain with urination or blood in the urine.  No abdominal pain.  No rash.  She feels like she cannot get a big breath and because of this pain and spasm.  The history is provided by the patient.    Past Medical History:  Diagnosis Date  . Arthritis    Ostearthritis- hips, knees, fingers  . Breast cancer ([place]) 1997   pt denies  . Headache(784.0)    tx. Valproic acid  . Hypertension   . Hypothyroidism     Patient Active Problem List   Diagnosis Date Noted  . Sciatica of right side 10/18/2018  . Seborrheic keratosis 10/17/2016  . Hyperlipidemia associated with type 2 diabetes mellitus ([place]) 10/17/2016  . Overactive bladder 10/17/2016  . Gastroesophageal reflux disease without esophagitis 10/17/2016  . Primary osteoarthritis involving multiple joints 10/17/2016  . Chronic gout without tophus 10/17/2016  . Encounter for immunization 05/16/2016  . Type 2 diabetes mellitus without complication, without long-term current use of insulin ([place]) 05/16/2016  . Essential hypertension 05/16/2016  . Hypothyroidism 05/16/2016  . Expected blood loss anemia  10/30/2013  . Morbid obesity ([place]) 10/30/2013  . S/P right THA, AA 10/29/2013    Past Surgical History:  Procedure Laterality Date  . ABDOMINAL HYSTERECTOMY    . CATARACT EXTRACTION, BILATERAL    . JOINT REPLACEMENT Left    [id number]  . MULTIPLE TOOTH EXTRACTIONS     60's  . PARATHYROIDECTOMY    . TOTAL HIP ARTHROPLASTY Right 10/29/2013   Procedure: RIGHT TOTAL HIP ARTHROPLASTY ANTERIOR APPROACH;  Surgeon: Mauri Pole, MD;  Location: [facility];  Service: Orthopedics;  Laterality: Right;     OB History   No obstetric history on file.      Home Medications    Prior to Admission medications   Medication Sig Start Date End Date Taking? Authorizing Provider  alendronate (FOSAMAX) 70 MG tablet TAKE 1 TABLET BY MOUTH ONCE A WEEK WITH A FULL GLASS OF WATER ON AN EMPTY STOMACH 10/18/18   Terald Sleeper, PA-C  allopurinol (ZYLOPRIM) 100 MG tablet TAKE 1 TABLET BY MOUTH  DAILY 07/31/18   Terald Sleeper, PA-C  amLODipine (NORVASC) 5 MG tablet TAKE 1 TABLET BY MOUTH  EVERY MORNING 07/31/18   Terald Sleeper, PA-C  blood glucose meter kit and supplies KIT Dispense based on patient and insurance preference. Use up to four times daily as directed. (FOR ICD-9 250.00, 250.01). 06/21/16   Terald Sleeper, PA-C  Cholecalciferol (VITAMIN  D-3) 5000 UNITS TABS Take 5,000 tablets by mouth daily.    [provider]  furosemide (LASIX) 40 MG tablet TAKE 1 TABLET BY MOUTH IN  THE MORNING AND ONE-HALF  TABLET BY MOUTH AT NIGHT 07/31/18   Terald Sleeper, PA-C  levothyroxine (SYNTHROID, LEVOTHROID) 25 MCG tablet TAKE 1 TABLET BY MOUTH  DAILY BEFORE BREAKFAST 10/18/18   Terald Sleeper, PA-C  loratadine (CLARITIN) 10 MG tablet Take 10 mg by mouth daily.    [provider]  losartan (COZAAR) 100 MG tablet TAKE 1 TABLET BY MOUTH  EVERY MORNING 07/31/18   Terald Sleeper, PA-C  meloxicam (MOBIC) 7.5 MG tablet TAKE 1 TABLET BY MOUTH  DAILY 07/31/18   Terald Sleeper, PA-C  metFORMIN (GLUCOPHAGE) 500 MG tablet TAKE 1  TABLET BY MOUTH TWO  TIMES DAILY WITH A MEAL 07/31/18   Terald Sleeper, PA-C  omeprazole (PRILOSEC) 20 MG capsule TAKE 1 CAPSULE BY MOUTH 2  TIMES DAILY BEFORE MEALS 07/31/18   Terald Sleeper, PA-C  ONE TOUCH ULTRA TEST test strip USE UP TO FOUR TIMES DAILY AS DIRECTED 10/04/17   Ronnald Ramp, Londell Moh, PA-C  ONETOUCH DELICA LANCETS FINE MISC USE AS DIRECTED UP TO FOUR TIMES DAILY 10/04/17   Terald Sleeper, PA-C  oxybutynin (DITROPAN) 5 MG tablet TAKE 1 TABLET BY MOUTH 3  TIMES DAILY 07/31/18   Terald Sleeper, PA-C  predniSONE (STERAPRED UNI-PAK 48 TAB) 10 MG (48) TBPK tablet Take as directed for 12 days 10/18/18   Terald Sleeper, PA-C  simvastatin (ZOCOR) 40 MG tablet TAKE 1 TABLET BY MOUTH  DAILY 07/31/18   Terald Sleeper, PA-C    Family History Family History  Problem Relation Age of Onset  . Colitis Sister 65       alive  . Cancer Sister 31       colon  . Cancer Mother 53       uterine  . Heart disease Father 58       heart failure  . Heart attack Brother   . Healthy Daughter   . Healthy Son   . Pulmonary embolism Brother   . Heart disease Brother   . Healthy Son   . Healthy Son     Social History Social History   Tobacco Use  . Smoking status: Never Smoker  . Smokeless tobacco: Never Used  Substance Use Topics  . Alcohol use: No    Alcohol/week: 0.0 standard drinks  . Drug use: No     Allergies   Patient has no known allergies.   Review of Systems Review of Systems  Constitutional: Negative for activity change, appetite change and fever.  HENT: Negative for congestion and rhinorrhea.   Eyes: Negative for visual disturbance.  Respiratory: Positive for chest tightness and shortness of breath.   Cardiovascular: Positive for chest pain.  Gastrointestinal: Negative for abdominal pain, nausea and vomiting.  Genitourinary: Negative for dysuria and hematuria.  Musculoskeletal: Negative for arthralgias and myalgias.  Skin: Negative for rash.  Neurological: Negative for weakness,  numbness and headaches.   all other systems are negative except as noted in the HPI and PMH.     Physical Exam Updated Vital Signs BP (!) 192/81   Pulse 91   Temp 99.2 F (37.3 C)   Resp 20   Ht 5' 3"  (1.6 m)   Wt 105.2 kg   SpO2 92%   BMI 41.10 kg/m   Physical Exam Vitals signs and  nursing note reviewed.  Constitutional:      General: She is not in acute distress.    Appearance: She is well-developed. She is obese.  HENT:     Head: Normocephalic and atraumatic.     Mouth/Throat:     Pharynx: No oropharyngeal exudate.  Eyes:     Conjunctiva/sclera: Conjunctivae normal.     Pupils: Pupils are equal, round, and reactive to light.  Neck:     Musculoskeletal: Normal range of motion and neck supple.     Comments: No meningismus. Cardiovascular:     Rate and Rhythm: Normal rate and regular rhythm.     Heart sounds: Normal heart sounds. No murmur.  Pulmonary:     Effort: Pulmonary effort is normal. No respiratory distress.     Breath sounds: Normal breath sounds.     Comments: No reproducible rib tenderness, no rash, splinting respirations Chest:     Chest wall: No tenderness.  Abdominal:     Palpations: Abdomen is soft.     Tenderness: There is no abdominal tenderness. There is no guarding or rebound.  Musculoskeletal: Normal range of motion.        General: No tenderness.  Skin:    General: Skin is warm.     Capillary Refill: Capillary refill takes less than 2 seconds.  Neurological:     General: No focal deficit present.     Mental Status: She is alert and oriented to person, place, and time. Mental status is at baseline.     Cranial Nerves: No cranial nerve deficit.     Motor: No abnormal muscle tone.     Coordination: Coordination normal.     Comments: No ataxia on finger to nose bilaterally. No pronator drift. 5/5 strength throughout. CN 2-12 intact.Equal grip strength. Sensation intact.   Psychiatric:        Behavior: Behavior normal.      ED Treatments  / Results  Labs (all labs ordered are listed, but only abnormal results are displayed) Labs Reviewed  BASIC METABOLIC PANEL - Abnormal; Notable for the following components:      Result Value   Glucose, Bld 130 (*)    Calcium 10.4 (*)    GFR calc non Af Amer 58 (*)    All other components within normal limits  CBC - Abnormal; Notable for the following components:   WBC 11.5 (*)    Hemoglobin 10.9 (*)    MCH 23.6 (*)    MCHC 29.1 (*)    RDW 18.1 (*)    All other components within normal limits  D-DIMER, QUANTITATIVE (NOT AT [name]) - Abnormal; Notable for the following components:   D-Dimer, Quant 5.62 (*)    All other components within normal limits  SARS CORONAVIRUS 2 (HOSPITAL ORDER, [facility])  TROPONIN I (HIGH SENSITIVITY)  TROPONIN I (HIGH SENSITIVITY)    EKG EKG Interpretation  Date/Time:  Wednesday April 03 2019 01:34:54 EDT Ventricular Rate:  88 PR Interval:    QRS Duration: 104 QT Interval:  374 QTC Calculation: 453 R Axis:   -17 Text Interpretation:  Sinus rhythm Sinus pause Low voltage, precordial leads LVH with secondary repolarization abnormality Anterior Q waves, possibly due to LVH No significant change was found Confirmed by Ezequiel Essex [phone]) on 04/03/2019 1:41:52 AM   Radiology Dg Chest 2 View  Result Date: 04/03/2019 CLINICAL DATA:  Right rib pain EXAM: CHEST - 2 VIEW COMPARISON:  10/22/2013 FINDINGS: Heart is normal size. Bibasilar  opacities, left greater than right. No effusions. No acute bony abnormality. IMPRESSION: Bibasilar airspace opacities, left greater than right could reflect atelectasis or infiltrates. Electronically Signed   By: Rolm Baptise M.D.   On: 04/03/2019 00:40   Ct Angio Chest Pe W And/or Wo Contrast  Result Date: 04/03/2019 CLINICAL DATA:  Right chest pain positive D-dimer EXAM: CT ANGIOGRAPHY CHEST WITH CONTRAST TECHNIQUE: Multidetector CT imaging of the chest was performed using the standard  protocol during bolus administration of intravenous contrast. Multiplanar CT image reconstructions and MIPs were obtained to evaluate the vascular anatomy. CONTRAST:  123m OMNIPAQUE IOHEXOL 350 MG/ML SOLN COMPARISON:  Chest x-ray earlier today FINDINGS: Cardiovascular: Filling defects are noted in the right lower lobe pulmonary arteries compatible with pulmonary emboli. No filling defects on the left. No evidence of right heart strain. Moderate aortic atherosclerosis. Cardiomegaly. Mediastinum/Nodes: No mediastinal, hilar, or axillary adenopathy. Trachea and esophagus are unremarkable. Lungs/Pleura: Ground-glass airspace opacity in the right lower lung could reflect early pulmonary infarct. Linear areas of atelectasis or scarring in both lung bases. No effusions. Upper Abdomen: Imaging into the upper abdomen shows no acute findings. Musculoskeletal: Chest wall soft tissues are unremarkable. No acute bony abnormality. Review of the MIP images confirms the above findings. IMPRESSION: Moderate size pulmonary emboli in the right lower lobe pulmonary arteries. Peripheral ground-glass opacity in the right lower lobe could reflect early pulmonary infarct. Cardiomegaly. Bibasilar scarring or atelectasis. Aortic Atherosclerosis (ICD10-I70.0). These results were called by telephone at the time of interpretation on 04/03/2019 at 3:55 am to provider [facility], who verbally acknowledged these results. Electronically Signed   By: KRolm BaptiseM.D.   On: 04/03/2019 03:56    Procedures Procedures (including critical care time)  Medications Ordered in ED Medications - No data to display   Initial Impression / Assessment and Plan / ED Course  I have reviewed the triage vital signs and the nursing notes.  Pertinent labs & imaging results that were available during my care of the patient were reviewed by me and considered in my medical decision making (see chart for details).       Pleuritic rib pain with  shortness of breath concerning for pulmonary embolism.  Her EKG shows LVH with sinus pause.  X-rays negative for pneumothorax or pneumonia.  Does show basilar atelectasis.  Creatinine sinus pauses on telemetry that she is asymptomatic from.  She declines pain medication.  Her d-dimer is positive and subsequent CT does show a subsegmental pulmonary emboli without evidence of right heart strain.  Given her arrhythmias, feel that she needs admission and will start IV heparin. She remains in no distress with O2 saturations in the low 90s.  She is agreeable to admission. Admission d/w Dr. ZClearence Ped  CRITICAL CARE Performed by: SEzequiel EssexTotal critical care time: 35 minutes Critical care time was exclusive of separately billable procedures and treating other patients. Critical care was necessary to treat or prevent imminent or life-threatening deterioration. Critical care was time spent personally by me on the following activities: development of treatment plan with patient and/or surrogate as well as nursing, discussions with consultants, evaluation of patient's response to treatment, examination of patient, obtaining history from patient or surrogate, ordering and performing treatments and interventions, ordering and review of laboratory studies, ordering and review of radiographic studies, pulse oximetry and re-evaluation of patient's condition.  GXan Sparkmanwas evaluated in Emergency Department on 04/03/2019 for the symptoms described in the history of present illness. She was evaluated in  the context of the global COVID-19 pandemic, which necessitated consideration that the patient might be at risk for infection with the SARS-CoV-2 virus that causes COVID-19. Institutional protocols and algorithms that pertain to the evaluation of patients at risk for COVID-19 are in a state of rapid change based on information released by regulatory bodies including the CDC and federal and state  organizations. These policies and algorithms were followed during the patient's care in the ED.   Final Clinical Impressions(s) / ED Diagnoses   Final diagnoses:  Multiple subsegmental pulmonary emboli without acute cor pulmonale    ED Discharge Orders    None       Corydon Schweiss, Annie Main, MD 04/03/19 [phone]

## 2019-04-03 NOTE — Progress Notes (Signed)
ANTICOAGULATION CONSULT NOTE -   Pharmacy Consult for heparin Indication: pulmonary embolus  No Known Allergies  Patient Measurements: Height: 5\' 3"  (160 cm) Weight: 232 lb (105.2 kg) IBW/kg (Calculated) : 52.4 HEPARIN DW (KG): 77.4   Vital Signs: Temp: 98 F (36.7 C) (09/09 1508) Temp Source: Oral (09/09 1508) BP: 136/68 (09/09 1508) Pulse Rate: 85 (09/09 1508)  Labs: Recent Labs    04/03/19 0001 04/03/19 0445 04/03/19 1031 04/03/19 1621  HGB 10.9*  --  10.9*  --   HCT 37.4  --  35.4*  --   PLT 250  --  237  --   APTT  --  198*  --   --   LABPROT  --  15.2  --   --   INR  --  1.2  --   --   HEPARINUNFRC  --   --  0.59 0.25*  CREATININE 0.95  --  0.71  --    Estimated Creatinine Clearance: 68.3 mL/min (by C-G formula based on SCr of 0.71 mg/dL).  Medical History: Past Medical History:  Diagnosis Date  . Arthritis    Ostearthritis- hips, knees, fingers  . Breast cancer ([place]) 1997   pt denies  . Headache(784.0)    tx. Valproic acid  . Hypertension   . Hypothyroidism     Medications:  Infusions:  . heparin 1,250 Units/hr (04/03/19 0437)    Assessment: 78 yo female admitted with SOB.  CT shows PE.  Pharmacy has been asked to dose heparin. Not on anticoagulant meds at home.   HL 0.25, subtherapeutic   Goal of Therapy:  Heparin level 0.3-0.7 units/ml  Monitor platelets per protocol   Plan:  Increase heparin infusion at 1400 units/hr Check anti-Xa level in 6-8 hours and daily while on heparin Continue to monitor H&H and platelets.   Thomasenia Sales, PharmD, MBA, BCGP Clinical Pharmacist  04/03/2019 5:15 PM

## 2019-04-03 NOTE — TOC Benefit Eligibility Note (Signed)
Transition of Care [employer] [facility]) Benefit Eligibility Note    Patient Details  Name: [name] [name] MRN: [id number] Date of Birth: 10/16/1940   Medication/Dose: lovenox  Covered?: No  Tier: Other(tier 4)     Spoke with Person/Company/Phone Number:: Lovena Le with [employer] at [phone]     Prior Approval: No  Deductible: Unmet($95 deductible)  Additional Notes: Lovenox is not covered, non formulary.  Enoxaparin 100 mg/ml  1 for 30 is covered and copay for that drug is $13.35.  1 for 90 copay is $38.25.    Tommy Medal Phone Number: 04/03/2019, 2:50 PM

## 2019-04-03 NOTE — Progress Notes (Addendum)
ANTICOAGULATION CONSULT NOTE - Preliminary  Pharmacy Consult for heparin Indication: pulmonary embolus  No Known Allergies  Patient Measurements: Height: 5\' 3"  (160 cm) Weight: 232 lb (105.2 kg) IBW/kg (Calculated) : 52.4 HEPARIN DW (KG): 77.4   Vital Signs: Temp: 99.2 F (37.3 C) (09/08 2221) BP: 168/63 (09/09 0200) Pulse Rate: 87 (09/09 0200)  Labs: Recent Labs    04/03/19 0001  HGB 10.9*  HCT 37.4  PLT 250  CREATININE 0.95   Estimated Creatinine Clearance: 57.5 mL/min (by C-G formula based on SCr of 0.95 mg/dL).  Medical History: Past Medical History:  Diagnosis Date  . Arthritis    Ostearthritis- hips, knees, fingers  . Breast cancer ([place]) 1997   pt denies  . Headache(784.0)    tx. Valproic acid  . Hypertension   . Hypothyroidism     Medications:  Infusions:  . heparin 1,250 Units/hr (04/03/19 0437)    Assessment: 78 yo female admitted with SOB.  CT shows PE.  Pharmacy has been asked to dose heparin. Not on anticoagulant meds at home.  Baseline labs pending.   Goal of Therapy:  Heparin level 0.3-0.7 units/ml   Plan:  Give 5000 units bolus x 1 Start heparin infusion at 1250 units/hr Check anti-Xa level in 6 hours and daily while on heparin Continue to monitor H&H and platelets Preliminary review of pertinent patient information completed.  Forestine Na clinical pharmacist will complete review during morning rounds to assess the patient and finalize treatment regimen.  Makayla Lanter Scarlett, RPH 04/03/2019,4:10 AM

## 2019-04-03 NOTE — TOC Initial Note (Signed)
Transition of Care [facility]) - Initial/Assessment Note    Patient Details  Name: [name] [name] MRN: [id number] Date of Birth: Jun 15, 1941  [facility] of [facility]) CM/SW Contact:    Boneta Lucks, RN Phone Number: 04/03/2019, 3:32 PM  Clinical Narrative:    CM referral for benefits check on Lovenox. CMA completed, Generic will have low copay. RN updated of discharge planning and need for education.                Expected Discharge Plan: Home/Self Care     Patient Goals and CMS Choice Patient states their goals for this hospitalization and ongoing recovery are:: to go home.      Expected Discharge Plan and Services Expected Discharge Plan: Home/Self Care   Activities of Daily Living Home Assistive Devices/Equipment: Shower chair with back, Environmental consultant (specify type)(front wheel walker ) ADL Screening (condition at time of admission) Patient's cognitive ability adequate to safely complete daily activities?: Yes Is the patient deaf or have difficulty hearing?: No Does the patient have difficulty seeing, even when wearing glasses/contacts?: No Does the patient have difficulty concentrating, remembering, or making decisions?: No Patient able to express need for assistance with ADLs?: Yes Does the patient have difficulty dressing or bathing?: No Independently performs ADLs?: Yes (appropriate for developmental age) Does the patient have difficulty walking or climbing stairs?: Yes Weakness of Legs: Both Weakness of Arms/Hands: None     Admission diagnosis:  Multiple subsegmental pulmonary emboli without acute cor pulmonale [I26.94] Patient Active Problem List   Diagnosis Date Noted  . Pulmonary embolism ([place]) 04/03/2019  . Sciatica of right side 10/18/2018  . Seborrheic keratosis 10/17/2016  . Hyperlipidemia associated with type 2 diabetes mellitus ([place]) 10/17/2016  . Overactive bladder 10/17/2016  . Gastroesophageal reflux disease without esophagitis 10/17/2016  . Primary  osteoarthritis involving multiple joints 10/17/2016  . Chronic gout without tophus 10/17/2016  . Encounter for immunization 05/16/2016  . Type 2 diabetes mellitus without complication, without long-term current use of insulin ([place]) 05/16/2016  . Essential hypertension 05/16/2016  . Hypothyroidism 05/16/2016  . Expected blood loss anemia 10/30/2013  . Morbid obesity ([place]) 10/30/2013  . S/P right THA, AA 10/29/2013   PCP:  Terald Sleeper, PA-C Pharmacy:   [facility] [address] [place] [address] [place]  [address] Phone: [phone] Fax: [phone]  [place], [place] [address] [place] [address] Phone: [phone] Fax: [phone]         Readmission Risk Interventions No flowsheet data found.

## 2019-04-03 NOTE — Progress Notes (Signed)
ANTICOAGULATION CONSULT NOTE -   Pharmacy Consult for heparin Indication: pulmonary embolus  No Known Allergies  Patient Measurements: Height: 5\' 3"  (160 cm) Weight: 232 lb (105.2 kg) IBW/kg (Calculated) : 52.4 HEPARIN DW (KG): 77.4   Vital Signs: Temp: 98.8 F (37.1 C) (09/09 0900) Temp Source: Oral (09/09 0900) BP: 157/73 (09/09 0900) Pulse Rate: 89 (09/09 0900)  Labs: Recent Labs    04/03/19 0001 04/03/19 0445 04/03/19 1031  HGB 10.9*  --  10.9*  HCT 37.4  --  35.4*  PLT 250  --  237  APTT  --  198*  --   LABPROT  --  15.2  --   INR  --  1.2  --   HEPARINUNFRC  --   --  0.59  CREATININE 0.95  --  0.71   Estimated Creatinine Clearance: 68.3 mL/min (by C-G formula based on SCr of 0.71 mg/dL).  Medical History: Past Medical History:  Diagnosis Date  . Arthritis    Ostearthritis- hips, knees, fingers  . Breast cancer ([place]) 1997   pt denies  . Headache(784.0)    tx. Valproic acid  . Hypertension   . Hypothyroidism     Medications:  Infusions:  . heparin 1,250 Units/hr (04/03/19 0437)    Assessment: 78 yo female admitted with SOB.  CT shows PE.  Pharmacy has been asked to dose heparin. Not on anticoagulant meds at home.   HL 0.59  Goal of Therapy:  Heparin level 0.3-0.7 units/ml  Monitor platelets per protocol   Plan:  Continue heparin infusion at 1250 units/hr Check anti-Xa level in 6-8 hours and daily while on heparin Continue to monitor H&H and platelets.   Margot Ables, PharmD Clinical Pharmacist 04/03/2019 11:06 AM

## 2019-04-03 NOTE — Progress Notes (Signed)
Patient seen and evaluated, chart reviewed, please see EMR for updated orders. Please see full H&P dictated by admitting physician Dr Clearence Ped for same date of service.     Temp:  [98 F (36.7 C)-99.2 F (37.3 C)] 98 F (36.7 C) (09/09 1508) Pulse Rate:  [56-95] 85 (09/09 1508) Resp:  [20-33] 20 (09/09 1508) BP: (132-192)/(52-81) 136/68 (09/09 1508) SpO2:  [91 %-95 %] 91 % (09/09 1508) Weight:  [105.2 kg] 105.2 kg (09/08 2220)   78 y.o. female with history of hypothyroidism, DM and hypertension admitted on 04/03/2019 with right-sided chest pain and found to have right-sided pulmonary embolism with pulmonary infarction    1) right-sided pulmonary embolism with pulmonary infarction--- Echo with preserved EF (60 to 65%) without significant right heart strain, -Lower extremity Dopplers with right-sided extensive DVT -Continue IV heparin with transition to oral agent possibly on 04/04/2019 if  Stable -Dopplers suggest very very heavy clot burden, high risk for further propagation -Oxygen saturation at rest has been somewhat low between 91 to 92% mostly, patient does also be tachypneic with respiratory rate at times in the 30s -Patient also has dyspnea on exertion.... Unable to discharge at this time due to relative hypoxia, tachypnea and dyspnea on exertion in a patient with right lower extremity DVT with heavy clot burden and right-sided pulmonary embolism with pulmonary infarction  2)DM--- Use Novolog/Humalog Sliding scale insulin with Accu-Cheks/Fingersticks as ordered  3)HTN--continue amlodipine and losartan  4) anemia--appears chronic, hemoglobin 10.9, watch closely with anticoagulation

## 2019-04-04 DIAGNOSIS — E039 Hypothyroidism, unspecified: Secondary | ICD-10-CM

## 2019-04-04 DIAGNOSIS — I10 Essential (primary) hypertension: Secondary | ICD-10-CM

## 2019-04-04 LAB — CBC
HCT: 31.9 % — ABNORMAL LOW (ref 36.0–46.0)
Hemoglobin: 9.6 g/dL — ABNORMAL LOW (ref 12.0–15.0)
MCH: 24.1 pg — ABNORMAL LOW (ref 26.0–34.0)
MCHC: 30.1 g/dL (ref 30.0–36.0)
MCV: 79.9 fL — ABNORMAL LOW (ref 80.0–100.0)
Platelets: 224 10*3/uL (ref 150–400)
RBC: 3.99 MIL/uL (ref 3.87–5.11)
RDW: 18.1 % — ABNORMAL HIGH (ref 11.5–15.5)
WBC: 9.1 10*3/uL (ref 4.0–10.5)
nRBC: 0 % (ref 0.0–0.2)

## 2019-04-04 LAB — HEPARIN LEVEL (UNFRACTIONATED)
Heparin Unfractionated: 0.54 IU/mL (ref 0.30–0.70)
Heparin Unfractionated: 0.56 IU/mL (ref 0.30–0.70)

## 2019-04-04 LAB — GLUCOSE, CAPILLARY
Glucose-Capillary: 119 mg/dL — ABNORMAL HIGH (ref 70–99)
Glucose-Capillary: 104 mg/dL — ABNORMAL HIGH (ref 70–99)

## 2019-04-04 MED ORDER — FUROSEMIDE 20 MG PO TABS: 20.0000 mg | ORAL_TABLET | Freq: Two times a day (BID) | ORAL | 2 refills | Status: DC

## 2019-04-04 MED ORDER — ACETAMINOPHEN 325 MG PO TABS: 650.0000 mg | ORAL_TABLET | Freq: Four times a day (QID) | ORAL | 0 refills | Status: AC | PRN

## 2019-04-04 MED ORDER — APIXABAN 5 MG PO TABS
10.0000 mg | ORAL_TABLET | Freq: Two times a day (BID) | ORAL | Status: DC
  Administered 2019-04-04: 10 mg via ORAL
  Filled 2019-04-04 (×2): qty 2

## 2019-04-04 MED ORDER — PANTOPRAZOLE SODIUM 40 MG PO TBEC: 40.0000 mg | DELAYED_RELEASE_TABLET | Freq: Every day | ORAL | 3 refills | Status: DC

## 2019-04-04 MED ORDER — APIXABAN 5 MG PO TABS: 5.0000 mg | ORAL_TABLET | Freq: Two times a day (BID) | ORAL | Status: DC

## 2019-04-04 MED ORDER — ELIQUIS 5 MG VTE STARTER PACK: ORAL_TABLET | ORAL | 0 refills | Status: DC

## 2019-04-04 MED ORDER — LOSARTAN POTASSIUM 100 MG PO TABS: 100.0000 mg | ORAL_TABLET | Freq: Every day | ORAL | 3 refills | Status: DC

## 2019-04-04 MED ORDER — AMLODIPINE BESYLATE 5 MG PO TABS: 5.0000 mg | ORAL_TABLET | Freq: Every day | ORAL | 2 refills | Status: DC

## 2019-04-04 MED ORDER — APIXABAN 5 MG PO TABS: 5.0000 mg | ORAL_TABLET | Freq: Two times a day (BID) | ORAL | 5 refills | Status: DC

## 2019-04-04 NOTE — Discharge Instructions (Signed)
1) you are taking apixaban/Eliquis which is a blood thinner so Avoid ibuprofen/Advil/Aleve/Motrin/Goody Powders/Naproxen/BC powders/Meloxicam/Diclofenac/Indomethacin and other Nonsteroidal anti-inflammatory medications as these will make you more likely to bleed and can cause stomach ulcers, can also cause Kidney problems.  2) please call if dark stools, bloody urine or blood in your stool or nosebleed or any concerns about bleeding 3) you need to be on a blood thinner for at least 6 months and may be Lifelong--- your primary care physician will decide with you after 6 months if you need to continue with this blood thinner 4) please note that there are a few changes to your medications- 5) follow-up with your primary care physician for repeat CBC/complete blood count test and for reevaluation and recheck within 1 week

## 2019-04-04 NOTE — Progress Notes (Signed)
[place] for transition from heparin to apixaban Indication: pulmonary embolus  No Known Allergies  Patient Measurements: Height: 5\' 3"  (160 cm) Weight: 232 lb (105.2 kg) IBW/kg (Calculated) : 52.4  Vital Signs: Temp: 98.6 F (37 C) (09/10 0534) Temp Source: Oral (09/10 0534) BP: 120/60 (09/10 0534) Pulse Rate: 75 (09/10 0534)  Labs: Recent Labs    04/03/19 0001 04/03/19 0200 04/03/19 0445  04/03/19 1031 04/03/19 1621 04/04/19 0038 04/04/19 0837  HGB 10.9*  --   --   --  10.9*  --  9.6*  --   HCT 37.4  --   --   --  35.4*  --  31.9*  --   PLT 250  --   --   --  237  --  224  --   APTT  --   --  198*  --   --   --   --   --   LABPROT  --   --  15.2  --   --   --   --   --   INR  --   --  1.2  --   --   --   --   --   HEPARINUNFRC  --   --   --    < > 0.59 0.25* 0.54 0.56  CREATININE 0.95  --   --   --  0.71  --   --   --   TROPONINIHS 6 8  --   --   --   --   --   --    < > = values in this interval not displayed.    Estimated Creatinine Clearance: 68.3 mL/min (by C-G formula based on SCr of 0.71 mg/dL).   Medical History: Past Medical History:  Diagnosis Date  . Arthritis    Ostearthritis- hips, knees, fingers  . Breast cancer ([place]) 1997   pt denies  . Headache(784.0)    tx. Valproic acid  . Hypertension   . Hypothyroidism      Assessment: 78 yo female admitted with SOB.  CT shows PE.  Pharmacy has been asked to dose heparin. Not on anticoagulant meds at home.    Goal of Therapy:  Heparin level 0.3-0.7 units/ml Monitor platelets by anticoagulation protocol: Yes   Plan:  Discontinue heparin. Start apixaban 10 mg twice daily x 7 days followed by apixaban 5 mg twice daily. Monitor labs and s/s of bleeding.   Margot Ables, PharmD Clinical Pharmacist 04/04/2019 10:06 AM

## 2019-04-04 NOTE — Progress Notes (Signed)
Patient ambulated around floor about 100 ft. Tolerated very well on room air, oxygen saturation maintained at 95 %. Patient used a walker as well.

## 2019-04-04 NOTE — Progress Notes (Signed)
ANTICOAGULATION CONSULT NOTE  Pharmacy Consult for Heparin  Indication: pulmonary embolus  No Known Allergies  Patient Measurements: Height: 5\' 3"  (160 cm) Weight: 232 lb (105.2 kg) IBW/kg (Calculated) : 52.4  Vital Signs: Temp: 99 F (37.2 C) (09/09 2052) Temp Source: Oral (09/09 2052) BP: 163/62 (09/09 2052) Pulse Rate: 77 (09/09 2052)  Labs: Recent Labs    04/03/19 0001 04/03/19 0200 04/03/19 0445 04/03/19 1031 04/03/19 1621 04/04/19 0038  HGB 10.9*  --   --  10.9*  --  9.6*  HCT 37.4  --   --  35.4*  --  31.9*  PLT 250  --   --  237  --  224  APTT  --   --  198*  --   --   --   LABPROT  --   --  15.2  --   --   --   INR  --   --  1.2  --   --   --   HEPARINUNFRC  --   --   --  0.59 0.25* 0.54  CREATININE 0.95  --   --  0.71  --   --   TROPONINIHS 6 8  --   --   --   --     Estimated Creatinine Clearance: 68.3 mL/min (by C-G formula based on SCr of 0.71 mg/dL).   Medical History: Past Medical History:  Diagnosis Date  . Arthritis    Ostearthritis- hips, knees, fingers  . Breast cancer ([place]) 1997   pt denies  . Headache(784.0)    tx. Valproic acid  . Hypertension   . Hypothyroidism      Assessment: 78 yo female admitted with SOB.  CT shows PE.  Pharmacy has been asked to dose heparin. Not on anticoagulant meds at home.   Heparin level therapeutic x 1 this AM after rate increase  Goal of Therapy:  Heparin level 0.3-0.7 units/ml Monitor platelets by anticoagulation protocol: Yes   Plan:  Cont heparin at 1400 units/hr Confirmatory heparin level in 8 hours  Narda Bonds, PharmD, [place] Pharmacist Phone: [phone]

## 2019-04-04 NOTE — Discharge Summary (Signed)
[name] [name], is a 78 y.o. female  DOB May 06, 1941  MRN [id number].  Admission date:  04/03/2019  Admitting Physician  Rolla Plate, DO  Discharge Date:  04/04/2019   Primary MD  Terald Sleeper, PA-C  Recommendations for primary care physician for things to follow:   1) you are taking apixaban/Eliquis which is a blood thinner so Avoid ibuprofen/Advil/Aleve/Motrin/Goody Powders/Naproxen/BC powders/Meloxicam/Diclofenac/Indomethacin and other Nonsteroidal anti-inflammatory medications as these will make you more likely to bleed and can cause stomach ulcers, can also cause Kidney problems.  2) please call if dark stools, bloody urine or blood in your stool or nosebleed or any concerns about bleeding 3) you need to be on a blood thinner for at least 6 months and may be Lifelong--- your primary care physician will decide with you after 6 months if you need to continue with this blood thinner 4) please note that there are a few changes to your medications- 5) follow-up with your primary care physician for repeat CBC/complete blood count test and for reevaluation and recheck within 1 week   Admission Diagnosis  Multiple subsegmental pulmonary emboli without acute cor pulmonale [I26.94]   Discharge Diagnosis  Multiple subsegmental pulmonary emboli without acute cor pulmonale [I26.94]    Principal Problem:   Pulmonary embolism ([place]) Active Problems:   Essential hypertension   Type 2 diabetes mellitus without complication, without long-term current use of insulin ([place])   Hypothyroidism      Past Medical History:  Diagnosis Date   Arthritis    Ostearthritis- hips, knees, fingers   Breast cancer ([place]) 1997   pt denies   Headache(784.0)    tx. Valproic acid   Hypertension    Hypothyroidism     Past Surgical History:  Procedure Laterality Date   ABDOMINAL HYSTERECTOMY     CATARACT EXTRACTION,  BILATERAL     JOINT REPLACEMENT Left    0'7622   MULTIPLE TOOTH EXTRACTIONS     60's   PARATHYROIDECTOMY     TOTAL HIP ARTHROPLASTY Right 10/29/2013   Procedure: RIGHT TOTAL HIP ARTHROPLASTY ANTERIOR APPROACH;  Surgeon: Mauri Pole, MD;  Location: [facility];  Service: Orthopedics;  Laterality: Right;       HPI  from the history and physical done on the day of admission:    -[name] [name]  is a 78 y.o. female with history of hypothyroidism, hypertension, breast cancer, and arthritis presents today with right-sided pain.  Patient reports that the pain started at 7:30 PM.  She said it was sudden and stabbing.  It was all over her right flank and side.  And she came into the ER primarily for that reason.  Upon further questioning she does say that she had been feeling overly fatigued for the last week, some shortness of breath, some feelings like she was going to pass out.  She had had a dry cough, no hemoptysis.  Patient has not had any recent surgeries, she is not on any hormone supplementation.  She does not have active cancer.  Patient does report that she has been trying to stay home because of the pandemic and she thinks she has been overly sedentary.  Patient is never had a blood clot before.  Patient does not know of any blood clots running in her family.  In the ED CT angios showed a moderately sized PE in the right lower lobe pulmonary arteries.  Peripheral groundglass opacity in right lower lobe could indicate an early pulmonary infarct.  Patient had elevated d-dimer.  Patient had stable tropes.  Patient is being admitted for further work-up and management of pulmonary embolism.  Of note patient did have sinus pauses on telemetry in ED for this reason she will be kept on telemetry     Hospital Course:   Brief summary 78 y.o.femalewith history of hypothyroidism, DM and hypertension admitted on 04/03/2019 with right-sided chest pain  and found to have right-sided pulmonary embolism  with pulmonary infarction   A/p 1) right-sided pulmonary embolism with pulmonary infarction--- Echo with preserved EF (60 to 65%) without significant right heart strain, -Lower extremity Dopplers with right-sided extensive DVT -Treated with IV heparin with transitioned to Eliquis  -Dopplers suggest very very heavy clot burden, --No further hypoxia or tachycardia concerns -Patient ambulated on post ambulation O2 sats 96% and she was without significant dyspnea on exertion   2)DM--- -resume home regimen   3)HTN--continue amlodipine and losartan  4) anemia--appears chronic, hemoglobin down to 9.6 after IV fluids -repeat CBC with PCP within a week  Discharge Condition: Stable without chest pain, no dyspnea on exertion and no hypoxia no palpitations no dizziness no pleuritic symptoms  Follow UP  Follow-up Information    Terald Sleeper, PA-C Follow up on 04/17/2019.   Specialties: Librarian, academic, Family Medicine Why: Wednesday at 3:55 for your hospital follow up appointment Contact information: [place] [address] [phone]            Consults obtained - na  Diet and Activity recommendation:  As advised  Discharge Instructions    Discharge Instructions    Call MD for:  difficulty breathing, headache or visual disturbances   Complete by: As directed    Call MD for:  persistant dizziness or light-headedness   Complete by: As directed    Call MD for:  persistant nausea and vomiting   Complete by: As directed    Call MD for:  severe uncontrolled pain   Complete by: As directed    Call MD for:  temperature >100.4   Complete by: As directed    Diet - low sodium heart healthy   Complete by: As directed    Discharge instructions   Complete by: As directed    1) you are taking apixaban/Eliquis which is a blood thinner so Avoid ibuprofen/Advil/Aleve/Motrin/Goody Powders/Naproxen/BC powders/Meloxicam/Diclofenac/Indomethacin and other Nonsteroidal  anti-inflammatory medications as these will make you more likely to bleed and can cause stomach ulcers, can also cause Kidney problems.  2) please call if dark stools, bloody urine or blood in your stool or nosebleed or any concerns about bleeding 3) you need to be on a blood thinner for at least 6 months and may be Lifelong--- your primary care physician will decide with you after 6 months if you need to continue with this blood thinner 4) please note that there are a few changes to your medications- 5) follow-up with your primary care physician for repeat CBC/complete blood count test and for reevaluation and recheck within 1 week   Increase activity  slowly   Complete by: As directed         Discharge Medications     Allergies as of 04/04/2019   No Known Allergies     Medication List    STOP taking these medications   meloxicam 7.5 MG tablet Commonly known as: MOBIC   omeprazole 20 MG capsule Commonly known as: PRILOSEC Replaced by: pantoprazole 40 MG tablet     TAKE these medications   acetaminophen 325 MG tablet Commonly known as: TYLENOL Take 2 tablets (650 mg total) by mouth every 6 (six) hours as needed for mild pain or headache (or Fever >/= 101).   alendronate 70 MG tablet Commonly known as: FOSAMAX TAKE 1 TABLET BY MOUTH ONCE A WEEK WITH A FULL GLASS OF WATER ON AN EMPTY STOMACH What changed:   how much to take  how to take this  when to take this   allopurinol 100 MG tablet Commonly known as: ZYLOPRIM TAKE 1 TABLET BY MOUTH  DAILY   amLODipine 5 MG tablet Commonly known as: NORVASC Take 1 tablet (5 mg total) by mouth daily.   blood glucose meter kit and supplies Kit Dispense based on patient and insurance preference. Use up to four times daily as directed. (FOR ICD-9 250.00, 250.01).   Eliquis DVT/PE Starter Pack 5 MG Tabs Take as directed on package: start with two-54m tablets twice daily for 7 days. On day 8, switch to one-539mtablet twice daily.    apixaban 5 MG Tabs tablet Commonly known as: Eliquis Take 1 tablet (5 mg total) by mouth 2 (two) times daily. Start around May 02, 2019 after you finish the starter pack Start taking on: May 02, 2019   furosemide 20 MG tablet Commonly known as: LASIX Take 1 tablet (20 mg total) by mouth 2 (two) times daily. What changed:   medication strength  See the new instructions.   levothyroxine 25 MCG tablet Commonly known as: SYNTHROID TAKE 1 TABLET BY MOUTH  DAILY BEFORE BREAKFAST What changed:   how much to take  how to take this  when to take this   loratadine 10 MG tablet Commonly known as: CLARITIN Take 10 mg by mouth daily.   losartan 100 MG tablet Commonly known as: COZAAR Take 1 tablet (100 mg total) by mouth daily.   metFORMIN 500 MG tablet Commonly known as: GLUCOPHAGE TAKE 1 TABLET BY MOUTH TWO  TIMES DAILY WITH A MEAL What changed: See the new instructions.   ONE TOUCH ULTRA TEST test strip Generic drug: glucose blood USE UP TO FOUR TIMES DAILY AS DIRECTED   OneTouch Delica Lancets Fine Misc USE AS DIRECTED UP TO FOUR TIMES DAILY   oxybutynin 5 MG tablet Commonly known as: DITROPAN TAKE 1 TABLET BY MOUTH 3  TIMES DAILY   pantoprazole 40 MG tablet Commonly known as: PROTONIX Take 1 tablet (40 mg total) by mouth daily. Start taking on: April 05, 2019 Replaces: omeprazole 20 MG capsule   simvastatin 40 MG tablet Commonly known as: ZOCOR TAKE 1 TABLET BY MOUTH  DAILY What changed: when to take this   Vitamin D-3 125 MCG (5000 UT) Tabs Take 5,000 tablets by mouth daily.       Major procedures and Radiology Reports - PLEASE review detailed and final reports for all details, in brief -   Dg Chest 2 View  Result Date: 04/03/2019 CLINICAL DATA:  Right rib pain EXAM: CHEST - 2 VIEW COMPARISON:  10/22/2013 FINDINGS: Heart is normal size.  Bibasilar opacities, left greater than right. No effusions. No acute bony abnormality. IMPRESSION: Bibasilar  airspace opacities, left greater than right could reflect atelectasis or infiltrates. Electronically Signed   By: Rolm Baptise M.D.   On: 04/03/2019 00:40   Ct Angio Chest Pe W And/or Wo Contrast  Result Date: 04/03/2019 CLINICAL DATA:  Right chest pain positive D-dimer EXAM: CT ANGIOGRAPHY CHEST WITH CONTRAST TECHNIQUE: Multidetector CT imaging of the chest was performed using the standard protocol during bolus administration of intravenous contrast. Multiplanar CT image reconstructions and MIPs were obtained to evaluate the vascular anatomy. CONTRAST:  148m OMNIPAQUE IOHEXOL 350 MG/ML SOLN COMPARISON:  Chest x-ray earlier today FINDINGS: Cardiovascular: Filling defects are noted in the right lower lobe pulmonary arteries compatible with pulmonary emboli. No filling defects on the left. No evidence of right heart strain. Moderate aortic atherosclerosis. Cardiomegaly. Mediastinum/Nodes: No mediastinal, hilar, or axillary adenopathy. Trachea and esophagus are unremarkable. Lungs/Pleura: Ground-glass airspace opacity in the right lower lung could reflect early pulmonary infarct. Linear areas of atelectasis or scarring in both lung bases. No effusions. Upper Abdomen: Imaging into the upper abdomen shows no acute findings. Musculoskeletal: Chest wall soft tissues are unremarkable. No acute bony abnormality. Review of the MIP images confirms the above findings. IMPRESSION: Moderate size pulmonary emboli in the right lower lobe pulmonary arteries. Peripheral ground-glass opacity in the right lower lobe could reflect early pulmonary infarct. Cardiomegaly. Bibasilar scarring or atelectasis. Aortic Atherosclerosis (ICD10-I70.0). These results were called by telephone at the time of interpretation on 04/03/2019 at 3:55 am [facility] who verbally acknowledged these results. Electronically Signed   By: KRolm BaptiseM.D.   On: 04/03/2019 03:56   UKoreaVenous Img Lower Bilateral  Result Date:  04/03/2019 CLINICAL DATA:  78year old female with newly diagnosed pulmonary emboli. Evaluate for residual clot burden. EXAM: BILATERAL LOWER EXTREMITY VENOUS DOPPLER ULTRASOUND TECHNIQUE: Gray-scale sonography with graded compression, as well as color Doppler and duplex ultrasound were performed to evaluate the lower extremity deep venous systems from the level of the common femoral vein and including the common femoral, femoral, profunda femoral, popliteal and calf veins including the posterior tibial, peroneal and gastrocnemius veins when visible. The superficial great saphenous vein was also interrogated. Spectral Doppler was utilized to evaluate flow at rest and with distal augmentation maneuvers in the common femoral, femoral and popliteal veins. COMPARISON:  None. FINDINGS: RIGHT LOWER EXTREMITY Common Femoral Vein: No evidence of thrombus. Normal compressibility, respiratory phasicity and response to augmentation. Saphenofemoral Junction: No evidence of thrombus. Normal compressibility and flow on color Doppler imaging. Profunda Femoral Vein: No evidence of thrombus. Normal compressibility and flow on color Doppler imaging. Femoral Vein: The femoral vein is noncompressible beginning in the upper thigh. There is intermittent color flow throughout the upper and mid thigh consistent with nonocclusive thrombus. In the distal thigh, thrombus becomes occlusive. Popliteal Vein: Occlusive thrombus extends through the popliteal vein. Calf Veins: Occlusive thrombus extends into the peroneal veins. The posterior tibial veins are patent. Superficial Great Saphenous Vein: No evidence of thrombus. Normal compressibility. Venous Reflux:  None. Other Findings:  None. LEFT LOWER EXTREMITY Common Femoral Vein: No evidence of thrombus. Normal compressibility, respiratory phasicity and response to augmentation. Saphenofemoral Junction: No evidence of thrombus. Normal compressibility and flow on color Doppler imaging. Profunda  Femoral Vein: No evidence of thrombus. Normal compressibility and flow on color Doppler imaging. Femoral Vein: No evidence of thrombus. Normal compressibility, respiratory phasicity and response to augmentation. Popliteal Vein: No evidence  of thrombus. Normal compressibility, respiratory phasicity and response to augmentation. Calf Veins: No evidence of thrombus. Normal compressibility and flow on color Doppler imaging. Superficial Great Saphenous Vein: No evidence of thrombus. Normal compressibility. Venous Reflux:  None. Other Findings: Complex fluid collection with internal septations in the popliteal fossa measures 5 x 1.5 x 3.3 cm. Findings most consistent with a Baker's cyst. IMPRESSION: 1. Positive for DVT in the right lower extremity. A mixture of occlusive and nonocclusive thrombus is present beginning in the femoral vein in the upper thigh and extending throughout the right lower extremity into the calf veins. 2. Incidental note is made of a mildly complex left-sided Baker's cyst. Electronically Signed   By: Jacqulynn Cadet M.D.   On: 04/03/2019 15:41    Micro Results   Recent Results (from the past 240 hour(s))  SARS Coronavirus 2 [facility] order, Performed in [facility] hospital lab) Nasopharyngeal Nasopharyngeal Swab     Status: None   Collection Time: 04/03/19  1:01 AM   Specimen: Nasopharyngeal Swab  Result Value Ref Range Status   SARS Coronavirus 2 NEGATIVE NEGATIVE Final    Comment: (NOTE) If result is NEGATIVE SARS-CoV-2 target nucleic acids are NOT DETECTED. The SARS-CoV-2 RNA is generally detectable in upper and lower  respiratory specimens during the acute phase of infection. The lowest  concentration of SARS-CoV-2 viral copies this assay can detect is 250  copies / mL. A negative result does not preclude SARS-CoV-2 infection  and should not be used as the sole basis for treatment or other  patient management decisions.  A negative result may occur with  improper specimen  collection / handling, submission of specimen other  than nasopharyngeal swab, presence of viral mutation(s) within the  areas targeted by this assay, and inadequate number of viral copies  (<250 copies / mL). A negative result must be combined with clinical  observations, patient history, and epidemiological information. If result is POSITIVE SARS-CoV-2 target nucleic acids are DETECTED. The SARS-CoV-2 RNA is generally detectable in upper and lower  respiratory specimens dur ing the acute phase of infection.  Positive  results are indicative of active infection with SARS-CoV-2.  Clinical  correlation with patient history and other diagnostic information is  necessary to determine patient infection status.  Positive results do  not rule out bacterial infection or co-infection with other viruses. If result is PRESUMPTIVE POSTIVE SARS-CoV-2 nucleic acids MAY BE PRESENT.   A presumptive positive result was obtained on the submitted specimen  and confirmed on repeat testing.  While 2019 novel coronavirus  (SARS-CoV-2) nucleic acids may be present in the submitted sample  additional confirmatory testing may be necessary for epidemiological  and / or clinical management purposes  to differentiate between  SARS-CoV-2 and other Sarbecovirus currently known to infect humans.  If clinically indicated additional testing with an alternate test  methodology [phone]) is advised. The SARS-CoV-2 RNA is generally  detectable in upper and lower respiratory sp ecimens during the acute  phase of infection. The expected result is Negative. Fact Sheet for Patients:  StrictlyIdeas.no Fact Sheet for Healthcare Providers: BankingDealers.co.za This test is not yet approved or cleared by the [place] FDA and has been authorized for detection and/or diagnosis of SARS-CoV-2 by FDA under an Emergency Use Authorization (EUA).  This EUA will remain in effect  (meaning this test can be used) for the duration of the COVID-19 declaration under Section 564(b)(1) of the Act, 21 U.S.C. section 360bbb-3(b)(1), unless the authorization is terminated  or revoked sooner. Performed at [facility], [address], [place], [place] [address]        Today   Subjective    Lashawne Dura today has no new complaints,         Sister Gay Filler at bedside, questions answered  Patient has been seen and examined prior to discharge   Objective   Blood pressure 120/60, pulse 75, temperature 98.6 F (37 C), temperature source Oral, resp. rate 20, height 5' 3"  (1.6 m), weight 105.2 kg, SpO2 96 %.   Intake/Output Summary (Last 24 hours) at 04/04/2019 1443 Last data filed at 04/03/2019 1500 Gross per 24 hour  Intake 161.64 ml  Output --  Net 161.64 ml    Exam Gen:- Awake Alert, no acute distress  HEENT:- [place].AT, No sclera icterus Neck-Supple Neck,No JVD,.  Lungs-  CTAB , good air movement bilaterally  CV- S1, S2 normal, regular Abd-  +ve B.Sounds, Abd Soft, No tenderness,    Extremity/Skin:-Right lower extremity discomfort with palpation,   good pulses Psych-affect is appropriate, oriented x3 Neuro-no new focal deficits, no tremors    Data Review   CBC w Diff:  Lab Results  Component Value Date   WBC 9.1 04/04/2019   HGB 9.6 (L) 04/04/2019   HGB 10.9 (L) 08/30/2017   HCT 31.9 (L) 04/04/2019   HCT 35.3 08/30/2017   PLT 224 04/04/2019   PLT 280 08/30/2017   LYMPHOPCT PENDING 01/24/2011   LYMPHOPCT 36 01/24/2011   BANDSPCT PENDING 01/24/2011   MONOPCT PENDING 01/24/2011   MONOPCT 16 (H) 01/24/2011   EOSPCT PENDING 01/24/2011   EOSPCT 1 01/24/2011   BASOPCT PENDING 01/24/2011   BASOPCT 0 01/24/2011    CMP:  Lab Results  Component Value Date   NA 136 04/03/2019   NA 141 08/30/2017   K 4.0 04/03/2019   CL 104 04/03/2019   CO2 24 04/03/2019   BUN 16 04/03/2019   BUN 19 08/30/2017   CREATININE 0.71 04/03/2019   PROT 7.2 04/03/2019   PROT  6.7 08/30/2017   ALBUMIN 4.2 04/03/2019   ALBUMIN 4.5 08/30/2017   BILITOT 0.7 04/03/2019   BILITOT 0.4 08/30/2017   ALKPHOS 102 04/03/2019   AST 13 (L) 04/03/2019   ALT 17 04/03/2019  .   Total Discharge time is about 33 minutes  Roxan Hockey M.D on 04/04/2019 at 2:43 PM  Go to [email or web address] -  for contact info  [facility] - Office  [phone]

## 2019-04-05 ENCOUNTER — Telehealth: Payer: Self-pay | Admitting: Physician Assistant

## 2019-04-05 NOTE — Telephone Encounter (Signed)
If she has been able to take 1 dose now since getting the prescription it is okay for her to take another one tonight at bedtime.  However if she only gets it and by suppertime did only do 1 dose today.

## 2019-04-05 NOTE — Telephone Encounter (Signed)
Patient aware.

## 2019-04-05 NOTE — Telephone Encounter (Signed)
I am passing you this information on the patient because of the concern that she was discharged from the hospital and not able to get the Eliquis.  It was a pharmacy issue.  Is there something that the hospital can do to give the patient enough for a day or 2 in case something like this happens again?  Or give them the starter pack?  Just passing it on that this was her quality issue where it fell through the cracks.  And I ended up sending the medication in and not even the hospitalist today.

## 2019-04-05 NOTE — Telephone Encounter (Signed)
It can be sent to a different pharmacy, however the hospital list with the one is sent that.  But I am okay sending it a starter pack take as directed to a different pharmacy.  But also the the pharmacy can send it between them.  And if no one has it then we will just write prescriptions that have the instructions that is on the starter pack and they are  start with two-5mg  tablets twice daily for 7 days. On day 8, switch to one-5mg  tablet twice daily.   eliquis 5 mg #70 tab

## 2019-04-05 NOTE — Telephone Encounter (Signed)
After talking with pt and [facility] the Rx from hospital was sent to mail order first. It must be canceled before [facility] can fill it. We did not call it in so we can not cancel it. Patient is going to call her mail order and cancel it herself

## 2019-04-18 ENCOUNTER — Other Ambulatory Visit: Payer: Self-pay

## 2019-04-19 ENCOUNTER — Ambulatory Visit (INDEPENDENT_AMBULATORY_CARE_PROVIDER_SITE_OTHER): Payer: Medicare Other | Admitting: Physician Assistant

## 2019-04-19 ENCOUNTER — Encounter: Payer: Self-pay | Admitting: Physician Assistant

## 2019-04-19 VITALS — BP 160/83 | HR 85 | Temp 98.0°F | Ht 63.0 in | Wt 225.4 lb

## 2019-04-19 DIAGNOSIS — M159 Polyosteoarthritis, unspecified: Secondary | ICD-10-CM

## 2019-04-19 DIAGNOSIS — E039 Hypothyroidism, unspecified: Secondary | ICD-10-CM

## 2019-04-19 DIAGNOSIS — I2699 Other pulmonary embolism without acute cor pulmonale: Secondary | ICD-10-CM | POA: Diagnosis not present

## 2019-04-19 DIAGNOSIS — M150 Primary generalized (osteo)arthritis: Secondary | ICD-10-CM

## 2019-04-19 DIAGNOSIS — I10 Essential (primary) hypertension: Secondary | ICD-10-CM

## 2019-04-19 DIAGNOSIS — E119 Type 2 diabetes mellitus without complications: Secondary | ICD-10-CM | POA: Diagnosis not present

## 2019-04-19 MED ORDER — BLOOD GLUCOSE MONITOR KIT: PACK | 11 refills | Status: DC

## 2019-04-19 MED ORDER — DICLOFENAC SODIUM 1 % TD GEL: 4.0000 g | Freq: Four times a day (QID) | TRANSDERMAL | 11 refills | Status: DC

## 2019-04-20 LAB — LIPID PANEL
Chol/HDL Ratio: 3.1 ratio (ref 0.0–4.4)
Cholesterol, Total: 151 mg/dL (ref 100–199)
HDL: 49 mg/dL (ref >39)
LDL Chol Calc (NIH): 83 mg/dL (ref 0–99)
Triglycerides: 104 mg/dL (ref 0–149)
VLDL Cholesterol Cal: 19 mg/dL (ref 5–40)

## 2019-04-20 LAB — HEMOGLOBIN A1C
Est. average glucose Bld gHb Est-mCnc: 123 mg/dL
Hgb A1c MFr Bld: 5.9 % — ABNORMAL HIGH (ref 4.8–5.6)

## 2019-04-24 NOTE — Progress Notes (Signed)
BP (!) 160/83   Pulse 85   Temp 98 F (36.7 C) (Temporal)   Ht 5' 3"  (1.6 m)   Wt 225 lb 6.4 oz (102.2 kg)   SpO2 100%   BMI 39.93 kg/m    Subjective:    Patient ID: [name] [name], female    DOB: [birth year], 78 y.o.   MRN: [id number]  HPI: [name] [name] is a 78 y.o. female presenting on 04/19/2019 for Hospitalization Follow-up (Pulmonary Embolism )  This patient is coming in for a hospital follow-up related to her DVT and pulmonary embolism.  All of the charts are reviewed and notes from hospitalization.  All of her medications are reviewed.  She is taking Eliquis 5 mg twice daily.  She is not having any difficulty with the medication.  She states that she does need to get a new glucometer in order to take care of her blood pressure very well she denies any high sugars or very lungs.  We had discussed that would rather her not have lows at night so we will keep a watch for this.  She reports overall she is doing very well otherwise.  Past Medical History:  Diagnosis Date  . Arthritis    Ostearthritis- hips, knees, fingers  . Breast cancer ([place]) 1997   pt denies  . DVT (deep venous thrombosis) ([place])   . Headache(784.0)    tx. Valproic acid  . Hypertension   . Hypothyroidism   . Pulmonary embolism (HCC)    Relevant past medical, surgical, family and social history reviewed and updated as indicated. Interim medical history since our last visit reviewed. Allergies and medications reviewed and updated. DATA REVIEWED: CHART IN EPIC  Family History reviewed for pertinent findings.  Review of Systems  Constitutional: Positive for fatigue. Negative for activity change and fever.  HENT: Negative.   Eyes: Negative.   Respiratory: Negative.  Negative for cough.   Cardiovascular: Negative.  Negative for chest pain.  Gastrointestinal: Negative.  Negative for abdominal pain.  Endocrine: Negative.   Genitourinary: Negative.  Negative for dysuria.  Musculoskeletal: Positive for  arthralgias and joint swelling.  Skin: Negative.   Neurological: Negative.     Allergies as of 04/19/2019   No Known Allergies     Medication List       Accurate as of April 19, 2019 11:59 PM. If you have any questions, ask your nurse or doctor.        acetaminophen 325 MG tablet Commonly known as: TYLENOL Take 2 tablets (650 mg total) by mouth every 6 (six) hours as needed for mild pain or headache (or Fever >/= 101).   alendronate 70 MG tablet Commonly known as: FOSAMAX TAKE 1 TABLET BY MOUTH ONCE A WEEK WITH A FULL GLASS OF WATER ON AN EMPTY STOMACH What changed:   how much to take  how to take this  when to take this   allopurinol 100 MG tablet Commonly known as: ZYLOPRIM TAKE 1 TABLET BY MOUTH  DAILY   amLODipine 5 MG tablet Commonly known as: NORVASC Take 1 tablet (5 mg total) by mouth daily.   apixaban 5 MG Tabs tablet Commonly known as: Eliquis Take 1 tablet (5 mg total) by mouth 2 (two) times daily. Start around May 02, 2019 after you finish the starter pack Start taking on: May 02, 2019 What changed: Another medication with the same name was removed. Continue taking this medication, and follow the directions you see here. Changed by: [employer]  Adah Salvage, PA-C   blood glucose meter kit and supplies Kit Dispense based on patient and insurance preference. Use up to four times daily as directed. (FOR ICD-9 250.00, 250.01). What changed: Another medication with the same name was added. Make sure you understand how and when to take each. Changed by: Terald Sleeper, PA-C   blood glucose meter kit and supplies Kit Dispense based on patient and insurance preference. Use up to four times daily as directed E11.9 What changed: You were already taking a medication with the same name, and this prescription was added. Make sure you understand how and when to take each. Changed by: Terald Sleeper, PA-C   diclofenac sodium 1 % Gel Commonly known as: VOLTAREN Apply  4 g topically 4 (four) times daily. Started by: Terald Sleeper, PA-C   furosemide 20 MG tablet Commonly known as: LASIX Take 1 tablet (20 mg total) by mouth 2 (two) times daily.   levothyroxine 25 MCG tablet Commonly known as: SYNTHROID TAKE 1 TABLET BY MOUTH  DAILY BEFORE BREAKFAST What changed:   how much to take  how to take this  when to take this   loratadine 10 MG tablet Commonly known as: CLARITIN Take 10 mg by mouth daily.   losartan 100 MG tablet Commonly known as: COZAAR Take 1 tablet (100 mg total) by mouth daily.   metFORMIN 500 MG tablet Commonly known as: GLUCOPHAGE TAKE 1 TABLET BY MOUTH TWO  TIMES DAILY WITH A MEAL What changed: See the new instructions.   ONE TOUCH ULTRA TEST test strip Generic drug: glucose blood USE UP TO FOUR TIMES DAILY AS DIRECTED   OneTouch Delica Lancets Fine Misc USE AS DIRECTED UP TO FOUR TIMES DAILY   oxybutynin 5 MG tablet Commonly known as: DITROPAN TAKE 1 TABLET BY MOUTH 3  TIMES DAILY   pantoprazole 40 MG tablet Commonly known as: PROTONIX Take 1 tablet (40 mg total) by mouth daily.   simvastatin 40 MG tablet Commonly known as: ZOCOR TAKE 1 TABLET BY MOUTH  DAILY What changed: when to take this   Vitamin D-3 125 MCG (5000 UT) Tabs Take 5,000 tablets by mouth daily.          Objective:    BP (!) 160/83   Pulse 85   Temp 98 F (36.7 C) (Temporal)   Ht 5' 3"  (1.6 m)   Wt 225 lb 6.4 oz (102.2 kg)   SpO2 100%   BMI 39.93 kg/m   No Known Allergies  Wt Readings from Last 3 Encounters:  04/19/19 225 lb 6.4 oz (102.2 kg)  04/02/19 232 lb (105.2 kg)  10/09/17 235 lb (106.6 kg)    Physical Exam Constitutional:      General: She is not in acute distress.    Appearance: Normal appearance. She is well-developed.  HENT:     Head: Normocephalic and atraumatic.  Cardiovascular:     Rate and Rhythm: Normal rate.  Pulmonary:     Effort: Pulmonary effort is normal.  Skin:    General: Skin is warm and  dry.     Findings: No rash.  Neurological:     Mental Status: She is alert and oriented to person, place, and time.     Deep Tendon Reflexes: Reflexes are normal and symmetric.     Results for orders placed or performed in visit on 04/19/19  Hemoglobin A1c  Result Value Ref Range   Hgb A1c MFr Bld 5.9 (H) 4.8 - 5.6 %  Est. average glucose Bld gHb Est-mCnc 123 mg/dL  Lipid panel  Result Value Ref Range   Cholesterol, Total 151 100 - 199 mg/dL   Triglycerides 104 0 - 149 mg/dL   HDL 49 >39 mg/dL   VLDL Cholesterol Cal 19 5 - 40 mg/dL   LDL Chol Calc (NIH) 83 0 - 99 mg/dL   Chol/HDL Ratio 3.1 0.0 - 4.4 ratio      Assessment & Plan:   1. Hypothyroidism, unspecified type Continue medications  2. Essential hypertension - Hemoglobin A1c - Lipid panel  3. Type 2 diabetes mellitus without complication, without long-term current use of insulin (HCC) - Hemoglobin A1c - Lipid panel - blood glucose meter kit and supplies KIT; Dispense based on patient and insurance preference. Use up to four times daily as directed E11.9  Dispense: 1 each; Refill: 11  4. Acute pulmonary embolism without acute cor pulmonale, unspecified pulmonary embolism type (HCC) Continue Eliquis 5 mg 1 twice daily  5. Primary osteoarthritis involving multiple joints - diclofenac sodium (VOLTAREN) 1 % GEL; Apply 4 g topically 4 (four) times daily.  Dispense: 200 g; Refill: 11    Continue all other maintenance medications as listed above.  Follow up plan: Return in about 3 months (around 07/19/2019).  Educational handout given for [place] PA-C [facility] [address]  [place],  [address] [phone]   04/24/2019, 11:10 AM

## 2019-05-14 ENCOUNTER — Encounter: Payer: Self-pay | Admitting: Physician Assistant

## 2019-05-14 ENCOUNTER — Ambulatory Visit (INDEPENDENT_AMBULATORY_CARE_PROVIDER_SITE_OTHER): Payer: Medicare Other | Admitting: Physician Assistant

## 2019-05-14 ENCOUNTER — Other Ambulatory Visit: Payer: Self-pay

## 2019-05-14 VITALS — BP 156/78 | HR 96 | Temp 97.4°F | Ht 63.0 in | Wt 225.0 lb

## 2019-05-14 DIAGNOSIS — E039 Hypothyroidism, unspecified: Secondary | ICD-10-CM | POA: Diagnosis not present

## 2019-05-14 DIAGNOSIS — N3281 Overactive bladder: Secondary | ICD-10-CM

## 2019-05-14 DIAGNOSIS — M170 Bilateral primary osteoarthritis of knee: Secondary | ICD-10-CM

## 2019-05-14 DIAGNOSIS — I10 Essential (primary) hypertension: Secondary | ICD-10-CM | POA: Diagnosis not present

## 2019-05-14 MED ORDER — SOLIFENACIN SUCCINATE 10 MG PO TABS: 10.0000 mg | ORAL_TABLET | Freq: Every day | ORAL | 3 refills | Status: DC

## 2019-05-14 MED ORDER — PANTOPRAZOLE SODIUM 40 MG PO TBEC: 40.0000 mg | DELAYED_RELEASE_TABLET | Freq: Every day | ORAL | 3 refills | Status: DC

## 2019-05-14 MED ORDER — AMLODIPINE BESYLATE 5 MG PO TABS: 5.0000 mg | ORAL_TABLET | Freq: Every day | ORAL | 3 refills | Status: DC

## 2019-05-14 MED ORDER — LEVOTHYROXINE SODIUM 25 MCG PO TABS: 25.0000 ug | ORAL_TABLET | Freq: Every day | ORAL | 3 refills | Status: DC

## 2019-05-19 NOTE — Progress Notes (Signed)
BP (!) 156/78   Pulse 96   Temp (!) 97.4 F (36.3 C) (Temporal)   Ht 5' 3"  (1.6 m)   Wt 225 lb (102.1 kg)   SpO2 99%   BMI 39.86 kg/m    Subjective:    Patient ID: [name] [name], female    DOB: Jul 27, 1940, 78 y.o.   MRN: [id number]  HPI: [name] [name] is a 78 y.o. female presenting on 05/14/2019 for Knee Pain (bilateral)  Patient comes in for recheck on her blood pressure and her osteoarthritis.  Her knee pain has become much more severe in recent weeks.  She had had problems in the past with that.  Now is getting a lot worse.  She has been seen in [place] and even had surgery on her hips.  She is unable to take any type of antiinflammatory because she is on anticoagulation therapy.  We have looked at all of her medicines and she is up-to-date on them.  We will plan for orthopedic referral in the near future.  Past Medical History:  Diagnosis Date  . Arthritis    Ostearthritis- hips, knees, fingers  . Breast cancer ([place]) 1997   pt denies  . DVT (deep venous thrombosis) ([place])   . Headache(784.0)    tx. Valproic acid  . Hypertension   . Hypothyroidism   . Pulmonary embolism (HCC)    Relevant past medical, surgical, family and social history reviewed and updated as indicated. Interim medical history since our last visit reviewed. Allergies and medications reviewed and updated. DATA REVIEWED: CHART IN EPIC  Family History reviewed for pertinent findings.  Review of Systems  Constitutional: Negative.  Negative for activity change, fatigue and fever.  HENT: Negative.   Eyes: Negative.   Respiratory: Negative.  Negative for cough.   Cardiovascular: Negative.  Negative for chest pain.  Gastrointestinal: Negative.  Negative for abdominal pain.  Endocrine: Negative.   Genitourinary: Negative.  Negative for dysuria.  Musculoskeletal: Positive for arthralgias, gait problem, joint swelling and myalgias.  Skin: Negative.     Allergies as of 05/14/2019   No Known Allergies     Medication List       Accurate as of May 14, 2019 11:59 PM. If you have any questions, ask your nurse or doctor.        STOP taking these medications   oxybutynin 5 MG tablet Commonly known as: DITROPAN Stopped by: Terald Sleeper, PA-C     TAKE these medications   acetaminophen 325 MG tablet Commonly known as: TYLENOL Take 2 tablets (650 mg total) by mouth every 6 (six) hours as needed for mild pain or headache (or Fever >/= 101).   alendronate 70 MG tablet Commonly known as: FOSAMAX TAKE 1 TABLET BY MOUTH ONCE A WEEK WITH A FULL GLASS OF WATER ON AN EMPTY STOMACH What changed:   how much to take  how to take this  when to take this   allopurinol 100 MG tablet Commonly known as: ZYLOPRIM TAKE 1 TABLET BY MOUTH  DAILY   amLODipine 5 MG tablet Commonly known as: NORVASC Take 1 tablet (5 mg total) by mouth daily.   apixaban 5 MG Tabs tablet Commonly known as: Eliquis Take 1 tablet (5 mg total) by mouth 2 (two) times daily. Start around May 02, 2019 after you finish the starter pack   blood glucose meter kit and supplies Kit Dispense based on patient and insurance preference. Use up to four times daily as directed. (  FOR ICD-9 250.00, 250.01).   blood glucose meter kit and supplies Kit Dispense based on patient and insurance preference. Use up to four times daily as directed E11.9   diclofenac sodium 1 % Gel Commonly known as: VOLTAREN Apply 4 g topically 4 (four) times daily.   furosemide 20 MG tablet Commonly known as: LASIX Take 1 tablet (20 mg total) by mouth 2 (two) times daily.   levothyroxine 25 MCG tablet Commonly known as: SYNTHROID Take 1 tablet (25 mcg total) by mouth daily before breakfast. TAKE 1 TABLET BY MOUTH  DAILY BEFORE BREAKFAST   loratadine 10 MG tablet Commonly known as: CLARITIN Take 10 mg by mouth daily.   losartan 100 MG tablet Commonly known as: COZAAR Take 1 tablet (100 mg total) by mouth daily.   metFORMIN 500 MG  tablet Commonly known as: GLUCOPHAGE TAKE 1 TABLET BY MOUTH TWO  TIMES DAILY WITH A MEAL What changed: See the new instructions.   ONE TOUCH ULTRA TEST test strip Generic drug: glucose blood USE UP TO FOUR TIMES DAILY AS DIRECTED   OneTouch Delica Lancets Fine Misc USE AS DIRECTED UP TO FOUR TIMES DAILY   pantoprazole 40 MG tablet Commonly known as: PROTONIX Take 1 tablet (40 mg total) by mouth daily.   simvastatin 40 MG tablet Commonly known as: ZOCOR TAKE 1 TABLET BY MOUTH  DAILY What changed: when to take this   solifenacin 10 MG tablet Commonly known as: VESICARE Take 1 tablet (10 mg total) by mouth daily. Started by: Terald Sleeper, PA-C   Vitamin D-3 125 MCG (5000 UT) Tabs Take 5,000 tablets by mouth daily.          Objective:    BP (!) 156/78   Pulse 96   Temp (!) 97.4 F (36.3 C) (Temporal)   Ht 5' 3"  (1.6 m)   Wt 225 lb (102.1 kg)   SpO2 99%   BMI 39.86 kg/m   No Known Allergies  Wt Readings from Last 3 Encounters:  05/14/19 225 lb (102.1 kg)  04/19/19 225 lb 6.4 oz (102.2 kg)  04/02/19 232 lb (105.2 kg)    Physical Exam Constitutional:      General: She is not in acute distress.    Appearance: Normal appearance. She is well-developed.  HENT:     Head: Normocephalic and atraumatic.  Cardiovascular:     Rate and Rhythm: Normal rate.  Pulmonary:     Effort: Pulmonary effort is normal.  Musculoskeletal:     Right knee: She exhibits decreased range of motion, swelling and deformity.     Left knee: She exhibits decreased range of motion, swelling and deformity.  Skin:    General: Skin is warm and dry.     Findings: No rash.  Neurological:     Mental Status: She is alert and oriented to person, place, and time.     Deep Tendon Reflexes: Reflexes are normal and symmetric.     Results for orders placed or performed in visit on 04/19/19  Hemoglobin A1c  Result Value Ref Range   Hgb A1c MFr Bld 5.9 (H) 4.8 - 5.6 %   Est. average glucose Bld  gHb Est-mCnc 123 mg/dL  Lipid panel  Result Value Ref Range   Cholesterol, Total 151 100 - 199 mg/dL   Triglycerides 104 0 - 149 mg/dL   HDL 49 >39 mg/dL   VLDL Cholesterol Cal 19 5 - 40 mg/dL   LDL Chol Calc (NIH) 83 0 - 99  mg/dL   Chol/HDL Ratio 3.1 0.0 - 4.4 ratio      Assessment & Plan:   1. Primary osteoarthritis of both knees - Ambulatory referral to Orthopedic Surgery  2. Overactive bladder  3. Essential hypertension - amLODipine (NORVASC) 5 MG tablet; Take 1 tablet (5 mg total) by mouth daily.  Dispense: 90 tablet; Refill: 3  4. Hypothyroidism, unspecified type - levothyroxine (SYNTHROID) 25 MCG tablet; Take 1 tablet (25 mcg total) by mouth daily before breakfast. TAKE 1 TABLET BY MOUTH  DAILY BEFORE BREAKFAST  Dispense: 90 tablet; Refill: 3   Continue all other maintenance medications as listed above.  Follow up plan: No follow-ups on file.  Educational handout given for arthritis  Terald Sleeper PA-C [facility] [address]  [place],  [address] [phone]   05/19/2019, 10:18 PM

## 2019-07-07 ENCOUNTER — Other Ambulatory Visit: Payer: Self-pay | Admitting: Physician Assistant

## 2019-07-07 DIAGNOSIS — E119 Type 2 diabetes mellitus without complications: Secondary | ICD-10-CM

## 2019-07-07 DIAGNOSIS — I10 Essential (primary) hypertension: Secondary | ICD-10-CM

## 2019-07-07 DIAGNOSIS — E785 Hyperlipidemia, unspecified: Secondary | ICD-10-CM

## 2019-07-07 DIAGNOSIS — N3281 Overactive bladder: Secondary | ICD-10-CM

## 2019-07-11 ENCOUNTER — Other Ambulatory Visit: Payer: Self-pay | Admitting: Physician Assistant

## 2019-07-11 DIAGNOSIS — I10 Essential (primary) hypertension: Secondary | ICD-10-CM

## 2019-09-13 ENCOUNTER — Other Ambulatory Visit: Payer: Self-pay | Admitting: Physician Assistant

## 2019-09-13 DIAGNOSIS — M1A9XX0 Chronic gout, unspecified, without tophus (tophi): Secondary | ICD-10-CM

## 2019-09-19 ENCOUNTER — Telehealth: Payer: Self-pay | Admitting: Physician Assistant

## 2019-09-19 NOTE — Chronic Care Management (AMB) (Signed)
  Chronic Care Management   Outreach Note  09/19/2019 Name: [name] [name] MRN: [id number] DOB: 05/08/1941  [name] is a 79 y.o. year old female who is a primary care patient of Terald Sleeper, PA-C. I reached out to [employer] by phone today in response to a referral sent by Ms. Malyssa Feagans's health plan.     An unsuccessful telephone outreach was attempted today. The patient was referred to the case management team for assistance with care management and care coordination.   Follow Up Plan: A HIPPA compliant phone message was left for the patient providing contact information and requesting a return call.  The care management team will reach out to the patient again over the next 7 days.  If patient returns call to provider office, please advise to call [facility]  at [facility], [place], [place], [place] [address] Direct Dial: [phone] [email or web address] Website: [email or web address]

## 2019-09-19 NOTE — Chronic Care Management (AMB) (Signed)
  Chronic Care Management   Note  09/19/2019 Name: [name] [name] MRN: [id number] DOB: [birth year]  [name] [name] is a 79 y.o. year old female who is a primary care patient of Terald Sleeper, PA-C. I reached out to [name] [name] by phone today in response to a referral sent by Ms. Tache Anstey's health plan.     Ms. Schnetzer was given information about Chronic Care Management services today including:  1. CCM service includes personalized support from designated clinical staff supervised by her physician, including individualized plan of care and coordination with other care providers 2. 24/7 contact phone numbers for assistance for urgent and routine care needs. 3. Service will only be billed when office clinical staff spend 20 minutes or more in a month to coordinate care. 4. Only one practitioner may furnish and bill the service in a calendar month. 5. The patient may stop CCM services at any time (effective at the end of the month) by phone call to the office staff. 6. The patient will be responsible for cost sharing (co-pay) of up to 20% of the service fee (after annual deductible is met).  Patient agreed to services and verbal consent obtained.   Follow up plan: Telephone appointment with care management team member scheduled for:11/04/2019  Noreene Larsson, [place], [place], [place] [address] Direct Dial: [phone] [email or web address] Website: [email or web address]

## 2019-11-04 ENCOUNTER — Telehealth: Payer: Self-pay | Admitting: *Deleted

## 2019-11-04 ENCOUNTER — Ambulatory Visit (INDEPENDENT_AMBULATORY_CARE_PROVIDER_SITE_OTHER): Payer: Medicare Other | Admitting: *Deleted

## 2019-11-04 DIAGNOSIS — E119 Type 2 diabetes mellitus without complications: Secondary | ICD-10-CM

## 2019-11-04 DIAGNOSIS — M170 Bilateral primary osteoarthritis of knee: Secondary | ICD-10-CM

## 2019-11-04 DIAGNOSIS — I10 Essential (primary) hypertension: Secondary | ICD-10-CM

## 2019-11-04 DIAGNOSIS — M810 Age-related osteoporosis without current pathological fracture: Secondary | ICD-10-CM

## 2019-11-04 NOTE — Patient Instructions (Signed)
Visit Information  Goals Addressed            This Visit's Progress     Patient Stated   . Blood Clot Management (pt-stated)        CARE PLAN ENTRY (see longtitudinal plan of care for additional care plan information)  Current Barriers:  . Knowledge Deficits related to cause of blood clots and recommended f/u  . Chronic Disease Management support and education needs related to prior DVT and PE  Nurse Case Manager Clinical Goal(s):  . Over the next 30 days, patient will work with PCP to address needs related to prior DVT and PE  Interventions:  . Inter-disciplinary care team collaboration (see longtitudinal plan of care) o Reached out to PCP and advised that patient is questioning whether she needs any follow-up studies for DVT or PE . Chart reviewed including office notes, discharge summaries, imaging reports, and lab results . Reviewed medications with patient and discussed Eliquis dosing and affordability . Discussed plans with patient for ongoing care management follow up and provided patient with direct contact information for care management team . Reviewed scheduled/upcoming provider appointments including: 12/03/19 [name] Joyce, [name] [name]  Patient Self Care Activities:  . Performs ADL's independently . Performs IADL's independently  Initial goal documentation       Other   . Chronic Disease Management Needs       CARE PLAN ENTRY (see longtitudinal plan of care for additional care plan information)  Current Barriers:  . Chronic Disease Management support, education, and care coordination needs related to HTN, HLD, DM, hypothyroidism, OA, osteoporosis  Clinical Goal(s) related to HTN, HLD, DM, hypothyroidism, OA, osteoporosis:  Over the next 60 days, patient will:  . Work with the care management team to address educational, disease management, and care coordination needs  . Begin or continue self health monitoring activities as directed today Measure and record cbg  (blood glucose) 1 times daily and Measure and record blood pressure 3-4 times per week . Call provider office for new or worsened signs and symptoms Blood glucose findings outside established parameters and Blood pressure findings outside established parameters . Call care management team with questions or concerns . Verbalize basic understanding of patient centered plan of care established today  Interventions related to HTN, HLD, DM, hypothyroidism, OA, osteoporosis:  . Evaluation of current treatment plans and patient's adherence to plan as established by provider . Assessed patient understanding of disease states . Assessed patient's education and care coordination needs . Provided disease specific education to patient  . Collaborated with appropriate clinical care team members regarding patient needs . Scheduled appointment with new PCP  . Provided with RN Care Manager contact number and encouraged to reach out as needed  Patient Self Care Activities related to HTN, HLD, DM, hypothyroidism, OA, osteoporosis:  . Patient is able to independently perform ADLs and IADLs  Initial goal documentation     . Osteoarthritis & Chronic Knee Pain       CARE PLAN ENTRY (see longtitudinal plan of care for additional care plan information)  Current Barriers:  . Chronic Disease Management support and education needs related to osteoarthritis resulting in chronic knee pain  Nurse Case Manager Clinical Goal(s):  . Over the next 30 days, patient will work with [name] [name] with [facility] to address needs related to osteoarthritis and chronic knee pain  Interventions:  . Inter-disciplinary care team collaboration (see longtitudinal plan of care) . Chart reviewed, including office notes, imaging reports, lab results,   and referrals o Referred to [name] [name] in 04/2019 but did not schedule an appointment . Talked with patient by telephone o She would like to re-establish care with [name] [name]. Has had knee  injections with him in the past.  o Patient did reach out to his office a couple of months ago but was unable to speak with anyone . Encouraged patient to reach out to the scheduling department at [facility] at [phone] . Provided with CCM contact information. Encouraged to reach out as needed, especially if she isn't able to schedule an appointment with [name] [name]  Patient Self Care Activities:  . Performs ADL's independently . Performs IADL's independently  Initial goal documentation     . Osteoporosis Management       CARE PLAN ENTRY (see longtitudinal plan of care for additional care plan information)  Current Barriers:  . Chronic Disease Management support and education needs related to osteoporosis  Nurse Case Manager Clinical Goal(s):  . Over the next 30 days, patient will have bone density scan performed . Over the next 60 days, patient will f/u with Clinical Pharmacist or PCP regarding osteoporosis management  Interventions:  . Inter-disciplinary care team collaboration (see longtitudinal plan of care) . Chart reviewed, including office notes, imaging reports, and lab results . Evaluation of current treatment plan related to osteoporosis and patient's adherence to plan as established by provider. . Advised patient to continue taking calcium and vit D . Reviewed medications with patient and discussed Fosamax. Patient discontinued it a few weeks ago on her own after she noticed upper abdominal pain that radiated to her back each Thursday after taking the pill on Wednesday. Pain has stopped since the med was stopped. She is taking Protonix.  . Collaborated with [name] [name], [name] [name] regarding scheduling of Dexa scan at [facility], preferably after appt on 5/11 with [name] Joyce, [name] [name] . Reviewed scheduled/upcoming provider appointments including: [name] Joyce, [name] [name] 12/03/19 at 8:05  Patient Self Care Activities:  . Performs ADL's independently . Performs IADL's  independently  Initial goal documentation        [name] [name] was given information about Chronic Care Management services today including:  1. CCM service includes personalized support from designated clinical staff supervised by her physician, including individualized plan of care and coordination with other care providers 2. 24/7 contact phone numbers for assistance for urgent and routine care needs. 3. Service will only be billed when office clinical staff spend 20 minutes or more in a month to coordinate care. 4. Only one practitioner may furnish and bill the service in a calendar month. 5. The patient may stop CCM services at any time (effective at the end of the month) by phone call to the office staff. 6. The patient will be responsible for cost sharing (co-pay) of up to 20% of the service fee (after annual deductible is met).  Patient agreed to services and verbal consent obtained.   The patient verbalized understanding of instructions provided today and declined a print copy of patient instruction materials.   The care management team will reach out to the patient again over the next 60 days.   [name] [name], BSN, RN-BC Embedded Chronic Care Manager [facility] Care Management Direct Dial: [phone]    

## 2019-11-04 NOTE — Telephone Encounter (Signed)
11/04/2019  [name] [name] is due for a dexa scan. She has an appointment with [name], [place] on 12/03/19 at 8:05 and would like to have it done at that appointment if possible.   Forwarding to [employer], RT for review and scheduling.   Chong Sicilian, BSN, RN-BC Embedded Chronic Care Manager [facility] [place] Management Direct Dial: [phone]

## 2019-11-04 NOTE — Chronic Care Management (AMB) (Signed)
Chronic Care Management   Initial Visit Note  11/04/2019 Name: [name] [name] MRN: [id number] DOB: 1941-05-16  Referred by: Loman Brooklyn, FNP Reason for referral : Chronic Care Management (Initial Visit)   [name] [name] is a 79 y.o. year old female who is a primary care patient of Loman Brooklyn, FNP. The CCM team was consulted for assistance with chronic disease management and care coordination needs related to HTN, HLD, DM, hypothyroidism, OA, osteoporosis.  Review of patient status, including review of consultants reports, relevant laboratory and other test results, and collaboration with appropriate care team members and the patient's provider was performed as part of comprehensive patient evaluation and provision of chronic care management services.    Subjective: I spoke with Ms Foulkes by telephone today regarding her chronic medical conditions. [place] and one of her son's lives with her. Two other children live on the same road. She does not have any problems with access to food, transportation, medication, or safe housing.   SDOH (Social Determinants of Health) assessments performed: Yes See Care Plan activities for detailed interventions related to SDOH     Objective: Outpatient Encounter Medications as of 11/04/2019  Medication Sig Note  . acetaminophen (TYLENOL) 325 MG tablet Take 2 tablets (650 mg total) by mouth every 6 (six) hours as needed for mild pain or headache (or Fever >/= 101).   Marland Kitchen allopurinol (ZYLOPRIM) 100 MG tablet TAKE 1 TABLET BY MOUTH  DAILY   . amLODipine (NORVASC) 5 MG tablet Take 1 tablet (5 mg total) by mouth daily.   Marland Kitchen apixaban (ELIQUIS) 5 MG TABS tablet Take 1 tablet (5 mg total) by mouth 2 (two) times daily. Start around May 02, 2019 after you finish the starter pack   . blood glucose meter kit and supplies KIT Dispense based on patient and insurance preference. Use up to four times daily as directed. (FOR ICD-9 250.00, 250.01).    . blood glucose meter kit and supplies KIT Dispense based on patient and insurance preference. Use up to four times daily as directed E11.9   . Cholecalciferol (VITAMIN D-3) 5000 UNITS TABS Take 5,000 tablets by mouth daily.   . diclofenac sodium (VOLTAREN) 1 % GEL Apply 4 g topically 4 (four) times daily.   . furosemide (LASIX) 20 MG tablet Take 1 tablet (20 mg total) by mouth 2 (two) times daily.   . furosemide (LASIX) 40 MG tablet TAKE 1 TABLET BY MOUTH IN  THE MORNING AND ONE-HALF  TABLET BY MOUTH AT NIGHT.  Needs to be seen for further refills.   Marland Kitchen levothyroxine (SYNTHROID) 25 MCG tablet Take 1 tablet (25 mcg total) by mouth daily before breakfast. TAKE 1 TABLET BY MOUTH  DAILY BEFORE BREAKFAST   . loratadine (CLARITIN) 10 MG tablet Take 10 mg by mouth daily.   [name] 100 MG tablet TAKE 1 TABLET BY MOUTH  EVERY MORNING   . metFORMIN (GLUCOPHAGE) 500 MG tablet Take 1 tablet (500 mg total) by mouth 2 (two) times daily with a meal. Give w/food.  Needs to be seen for further refills.   . ONE TOUCH ULTRA TEST test strip USE UP TO FOUR TIMES DAILY AS DIRECTED   . ONETOUCH DELICA LANCETS FINE MISC USE AS DIRECTED UP TO FOUR TIMES DAILY   . oxybutynin (DITROPAN) 5 MG tablet Take 1 tablet (5 mg total) by mouth 3 (three) times daily. Needs to be seen for further refills   . pantoprazole (  PROTONIX) 40 MG tablet Take 1 tablet (40 mg total) by mouth daily.   . simvastatin (ZOCOR) 40 MG tablet Take 1 tablet (40 mg total) by mouth daily at 6 PM. Needs to be seen for further refills.   . solifenacin (VESICARE) 10 MG tablet Take 1 tablet (10 mg total) by mouth daily.   Marland Kitchen alendronate (FOSAMAX) 70 MG tablet TAKE 1 TABLET BY MOUTH ONCE A WEEK WITH A FULL GLASS OF WATER ON AN EMPTY STOMACH (Patient not taking: Reported on 11/04/2019) 11/04/2019: Has not taken for the past few weeks due to upper GI pain. Pain resolved once med was d/c.   No facility-administered encounter medications on file as of  11/04/2019.      RN Care Plan   . Chronic Disease Management Needs       CARE PLAN ENTRY (see longtitudinal plan of care for additional care plan information)  Current Barriers:  . Chronic Disease Management support, education, and care coordination needs related to HTN, HLD, DM, hypothyroidism, OA, osteoporosis  Clinical Goal(s) related to HTN, HLD, DM, hypothyroidism, OA, osteoporosis:  Over the next 60 days, patient will:  . Work with the care management team to address educational, disease management, and care coordination needs  . Begin or continue self health monitoring activities as directed today Measure and record cbg (blood glucose) 1 times daily and Measure and record blood pressure 3-4 times per week . Call provider office for new or worsened signs and symptoms Blood glucose findings outside established parameters and Blood pressure findings outside established parameters . Call care management team with questions or concerns . Verbalize basic understanding of patient centered plan of care established today  Interventions related to HTN, HLD, DM, hypothyroidism, OA, osteoporosis:  . Evaluation of current treatment plans and patient's adherence to plan as established by provider . Assessed patient understanding of disease states . Assessed patient's education and care coordination needs . Provided disease specific education to patient  . Collaborated with appropriate clinical care team members regarding patient needs . Scheduled appointment with new PCP  . Provided with RN Care Manager contact number and encouraged to reach out as needed  Patient Self Care Activities related to HTN, HLD, DM, hypothyroidism, OA, osteoporosis:  . Patient is able to independently perform ADLs and IADLs  Initial goal documentation     . Osteoporosis Management       CARE PLAN ENTRY (see longtitudinal plan of care for additional care plan information)  Current Barriers:  . Chronic Disease  Management support and education needs related to osteoporosis  Nurse Case Manager Clinical Goal(s):  Marland Kitchen Over the next 30 days, patient will have bone density scan performed . Over the next 60 days, patient will f/u with Clinical Pharmacist or PCP regarding osteoporosis management  Interventions:  . Inter-disciplinary care team collaboration (see longtitudinal plan of care) . Chart reviewed, including office notes, imaging reports, and lab results . Evaluation of current treatment plan related to osteoporosis and patient's adherence to plan as established by provider. . Advised patient to continue taking calcium and vit D . Reviewed medications with patient and discussed Fosamax. Patient discontinued it a few weeks ago on her own after she noticed upper abdominal pain that radiated to her back each Thursday after taking the pill on Wednesday. Pain has stopped since the med was stopped. She is taking Protonix.  Marland Kitchen Collaborated with Lanier Prude, RT regarding scheduling of Dexa scan at [facility], preferably after appt on 5/11 with [facility]  Blanch Media, [place] . Reviewed scheduled/upcoming provider appointments including: [name], [place] 12/03/19 at 8:05  Patient Self Care Activities:  . Performs ADL's independently . Performs IADL's independently  Initial goal documentation     . Osteoarthritis and Chronic Knee Pain       CARE PLAN ENTRY (see longtitudinal plan of care for additional care plan information)  Current Barriers:  . Chronic Disease Management support and education needs related to osteoarthritis resulting in chronic knee pain  Nurse Case Manager Clinical Goal(s):  Marland Kitchen Over the next 30 days, patient will work with Dr Alvan Dame with [facility] to address needs related to osteoarthritis and chronic knee pain  Interventions:  . Inter-disciplinary care team collaboration (see longtitudinal plan of care) . Chart reviewed, including office notes, imaging reports, lab results, and referrals o  Referred to Dr Alvan Dame in 04/2019 but did not schedule an appointment . Talked with patient by telephone o She would like to re-establish care with Dr Alvan Dame. Has had knee injections with him in the past.  o Patient did reach out to his office a couple of months ago but was unable to speak with anyone . Encouraged patient to reach out to the scheduling department at [facility] at [phone] . Provided with CCM contact information. Encouraged to reach out as needed, especially if she isn't able to schedule an appointment with Dr Alvan Dame  Patient Self Care Activities:  . Performs ADL's independently . Performs IADL's independently  Initial goal documentation         Follow-up Plan:   The care management team will reach out to the patient again over the next 60 days.  Next PCP appointment scheduled for: 12/03/19 with [name], FNP  Chong Sicilian, BSN, RN-BC [place] [facility] [place] Management Direct Dial: [phone]

## 2019-11-05 NOTE — Telephone Encounter (Signed)
Appt for dxa made following OV with Blanch Media patient aware

## 2019-11-19 ENCOUNTER — Ambulatory Visit: Payer: Medicare Other | Admitting: *Deleted

## 2019-11-19 DIAGNOSIS — M810 Age-related osteoporosis without current pathological fracture: Secondary | ICD-10-CM

## 2019-11-19 DIAGNOSIS — E119 Type 2 diabetes mellitus without complications: Secondary | ICD-10-CM | POA: Diagnosis not present

## 2019-11-19 DIAGNOSIS — I10 Essential (primary) hypertension: Secondary | ICD-10-CM

## 2019-11-19 DIAGNOSIS — M170 Bilateral primary osteoarthritis of knee: Secondary | ICD-10-CM | POA: Diagnosis not present

## 2019-11-19 DIAGNOSIS — Z7901 Long term (current) use of anticoagulants: Secondary | ICD-10-CM

## 2019-11-19 MED ORDER — APIXABAN 5 MG PO TABS: 5.0000 mg | ORAL_TABLET | Freq: Two times a day (BID) | ORAL | 5 refills | Status: DC

## 2019-11-19 NOTE — Patient Instructions (Signed)
Visit Information  Goals Addressed            This Visit's Progress     Patient Stated   . Blood Clot Management (pt-stated)        CARE PLAN ENTRY (see longtitudinal plan of care for additional care plan information)  Chronic anticoagulation to manage hx of DVT and PE in a patient with HTN and diabetes  Current Barriers:  Marland Kitchen Knowledge Deficits related to cause of blood clots and recommended f/u  . Chronic Disease Management support and education needs related to prior DVT and PE  Nurse Case Manager Clinical Goal(s):  Marland Kitchen Over the next 30 days, patient will work with PCP to address needs related to prior DVT and PE  Interventions:  . Inter-disciplinary care team collaboration (see longtitudinal plan of care) o Collaborated with PCP regarding length of Eliquis treatment and if further tests are necessary. . Advised patient that no follow-up testing is necessary and that she should continue anticoagulants for life since the cause of her blood clots were unknown . Chart reviewed including office notes, discharge summaries, imaging reports, and lab results . Collaborated with PCP office to refill Eliquis per patient's request . Discussed plans with patient for ongoing care management follow up and provided patient with direct contact information for care management team . Reviewed scheduled/upcoming provider appointments including: 12/03/19 [name], FNP  Patient Self Care Activities:  . Performs ADL's independently . Performs IADL's independently  Please see past updates related to this goal by clicking on the "Past Updates" button in the selected goal         The patient verbalized understanding of instructions provided today and declined a print copy of patient instruction materials.   Follow-up Plan The care management team will reach out to the patient again over the next 45 days.   Chong Sicilian, BSN, RN-BC Embedded Chronic Care Manager [facility] [place] [facility] Direct Dial: [phone]

## 2019-11-19 NOTE — Chronic Care Management (AMB) (Signed)
Chronic Care Management   Follow Up Note   11/19/2019 Name: [name] [name] MRN: [id number] DOB: 1941-02-14  Referred by: Loman Brooklyn, FNP Reason for referral : Chronic Care Management (RN follow-up)   [name] [name] is a 79 y.o. year old female who is a primary care patient of Loman Brooklyn, FNP. The CCM team was consulted for assistance with chronic disease management and care coordination needs.    Review of patient status, including review of consultants reports, relevant laboratory and other test results, and collaboration with appropriate care team members and the patient's provider was performed as part of comprehensive patient evaluation and provision of chronic care management services.    SDOH (Social Determinants of Health) assessments performed: No See Care Plan activities for detailed interventions related to [facility])     Outpatient Encounter Medications as of 11/19/2019  Medication Sig Note  . acetaminophen (TYLENOL) 325 MG tablet Take 2 tablets (650 mg total) by mouth every 6 (six) hours as needed for mild pain or headache (or Fever >/= 101).   Marland Kitchen allopurinol (ZYLOPRIM) 100 MG tablet TAKE 1 TABLET BY MOUTH  DAILY   . amLODipine (NORVASC) 5 MG tablet Take 1 tablet (5 mg total) by mouth daily.   Marland Kitchen apixaban (ELIQUIS) 5 MG TABS tablet Take 1 tablet (5 mg total) by mouth 2 (two) times daily.   . blood glucose meter kit and supplies KIT Dispense based on patient and insurance preference. Use up to four times daily as directed. (FOR ICD-9 250.00, 250.01).   . blood glucose meter kit and supplies KIT Dispense based on patient and insurance preference. Use up to four times daily as directed E11.9   . Cholecalciferol (VITAMIN D-3) 5000 UNITS TABS Take 5,000 tablets by mouth daily.   . diclofenac sodium (VOLTAREN) 1 % GEL Apply 4 g topically 4 (four) times daily.   . furosemide (LASIX) 20 MG tablet Take 1 tablet (20 mg total) by mouth 2 (two) times daily.   . furosemide (LASIX) 40 MG  tablet TAKE 1 TABLET BY MOUTH IN  THE MORNING AND ONE-HALF  TABLET BY MOUTH AT NIGHT.  Needs to be seen for further refills.   Marland Kitchen levothyroxine (SYNTHROID) 25 MCG tablet Take 1 tablet (25 mcg total) by mouth daily before breakfast. TAKE 1 TABLET BY MOUTH  DAILY BEFORE BREAKFAST   . loratadine (CLARITIN) 10 MG tablet Take 10 mg by mouth daily.   [name] 100 MG tablet TAKE 1 TABLET BY MOUTH  EVERY MORNING   . metFORMIN (GLUCOPHAGE) 500 MG tablet Take 1 tablet (500 mg total) by mouth 2 (two) times daily with a meal. Give w/food.  Needs to be seen for further refills.   . ONE TOUCH ULTRA TEST test strip USE UP TO FOUR TIMES DAILY AS DIRECTED   . ONETOUCH DELICA LANCETS FINE MISC USE AS DIRECTED UP TO FOUR TIMES DAILY   . oxybutynin (DITROPAN) 5 MG tablet Take 1 tablet (5 mg total) by mouth 3 (three) times daily. Needs to be seen for further refills   . pantoprazole (PROTONIX) 40 MG tablet Take 1 tablet (40 mg total) by mouth daily.   . simvastatin (ZOCOR) 40 MG tablet Take 1 tablet (40 mg total) by mouth daily at 6 PM. Needs to be seen for further refills.   . solifenacin (VESICARE) 10 MG tablet Take 1 tablet (10 mg total) by mouth daily.   . [DISCONTINUED] alendronate (FOSAMAX) 70 MG tablet TAKE 1 TABLET BY MOUTH  ONCE A WEEK WITH A FULL GLASS OF WATER ON AN EMPTY STOMACH (Patient not taking: Reported on 11/04/2019) 11/04/2019: Has not taken for the past few weeks due to upper GI pain. Pain resolved once med was d/c.  . [DISCONTINUED] apixaban (ELIQUIS) 5 MG TABS tablet Take 1 tablet (5 mg total) by mouth 2 (two) times daily. Start around May 02, 2019 after you finish the starter pack    No facility-administered encounter medications on file as of 11/19/2019.      RN Care Plan    . Blood Clot Management (pt-stated)        CARE PLAN ENTRY (see longtitudinal plan of care for additional care plan information)  Chronic anticoagulation to manage hx of DVT and PE in a patient with HTN and  diabetes  Current Barriers:  Marland Kitchen Knowledge Deficits related to cause of blood clots and recommended f/u  . Chronic Disease Management support and education needs related to prior DVT and PE  Nurse Case Manager Clinical Goal(s):  Marland Kitchen Over the next 30 days, patient will work with PCP to address needs related to prior DVT and PE  Interventions:  . Inter-disciplinary care team collaboration (see longtitudinal plan of care) o Collaborated with PCP regarding length of Eliquis treatment and if further tests are necessary. . Advised patient that no follow-up testing is necessary and that she should continue anticoagulants for life since the cause of her blood clots were unknown . Chart reviewed including office notes, discharge summaries, imaging reports, and lab results . Collaborated with PCP office to refill Eliquis per patient's request . Discussed plans with patient for ongoing care management follow up and provided patient with direct contact information for care management team . Reviewed scheduled/upcoming provider appointments including: 12/03/19 [name], FNP  Patient Self Care Activities:  . Performs ADL's independently . Performs IADL's independently  Please see past updates related to this goal by clicking on the "Past Updates" button in the selected goal          Plan:   The care management team will reach out to the patient again over the next 45 days.   Chong Sicilian, BSN, RN-BC Embedded Chronic Care Manager [facility] [place] [facility] Direct Dial: [phone]

## 2019-11-21 ENCOUNTER — Ambulatory Visit (INDEPENDENT_AMBULATORY_CARE_PROVIDER_SITE_OTHER): Payer: Medicare Other | Admitting: *Deleted

## 2019-11-21 DIAGNOSIS — Z0000 Encounter for general adult medical examination without abnormal findings: Secondary | ICD-10-CM | POA: Diagnosis not present

## 2019-11-21 NOTE — Progress Notes (Signed)
MEDICARE ANNUAL WELLNESS VISIT  11/21/2019  Telephone Visit Disclaimer This Medicare AWV was conducted by telephone due to national recommendations for restrictions regarding the COVID-19 Pandemic (e.g. social distancing).  I verified, using two identifiers, that I am speaking with [name] [name] or their authorized healthcare agent. I discussed the limitations, risks, security, and privacy concerns of performing an evaluation and management service by telephone and the potential availability of an in-person appointment in the future. The patient expressed understanding and agreed to proceed.   Subjective:  [name] [name] is a 79 y.o. female patient of Loman Brooklyn, FNP who had a Medicare Annual Wellness Visit today via telephone. Lajeana is retired and lives with her son. She is widowed and has 4 children. She reports that she is socially active and does interact with friends/family regularly. She is not physically active and enjoys gardening.  Patient Care Team: Loman Brooklyn, FNP as PCP - General (Family Medicine) Paralee Cancel, MD as Consulting Physician (Orthopedic Surgery) Ilean China, RN as Registered Nurse  Advanced Directives 11/21/2019 04/03/2019 04/02/2019 08/01/2017 12/30/2015 10/29/2013 10/22/2013  Does Patient Have a Medical Advance Directive? Yes Yes No Yes Yes Patient has advance directive, copy not in chart Patient has advance directive, copy not in chart  Type of Advance Directive Living will;Healthcare Power of [place];Living will [place];Living will [place];Living will [place];Living will  Does patient want to make changes to medical advance directive? - No - Patient declined - No - Patient declined No - Patient declined No change requested No change requested  Copy of [place] in Chart? - - - - No - copy requested Copy requested from  family Copy requested from family  Pre-existing out of facility DNR order (yellow form or pink MOST form) - - - - - No No    Hospital Utilization Over the Past 12 Months: # of hospitalizations or ER visits: 1 # of surgeries: 0  Review of Systems    Patient reports that her overall health is better compared to last year.  History obtained from the patient and patient chart.   Patient Reported Readings (BP, Pulse, CBG, Weight, etc) none  Pain Assessment Pain : No/denies pain     Current Medications & Allergies (verified) Allergies as of 11/21/2019   No Known Allergies     Medication List       Accurate as of November 21, 2019 10:53 AM. If you have any questions, ask your nurse or doctor.        acetaminophen 325 MG tablet Commonly known as: TYLENOL Take 2 tablets (650 mg total) by mouth every 6 (six) hours as needed for mild pain or headache (or Fever >/= 101).   allopurinol 100 MG tablet Commonly known as: ZYLOPRIM TAKE 1 TABLET BY MOUTH  DAILY   amLODipine 5 MG tablet Commonly known as: NORVASC Take 1 tablet (5 mg total) by mouth daily.   apixaban 5 MG Tabs tablet Commonly known as: Eliquis Take 1 tablet (5 mg total) by mouth 2 (two) times daily.   blood glucose meter kit and supplies Kit Dispense based on patient and insurance preference. Use up to four times daily as directed. (FOR ICD-9 250.00, 250.01).   blood glucose meter kit and supplies Kit Dispense based on patient and insurance preference. Use up to four times daily as directed E11.9   diclofenac sodium  1 % Gel Commonly known as: VOLTAREN Apply 4 g topically 4 (four) times daily.   furosemide 20 MG tablet Commonly known as: LASIX Take 1 tablet (20 mg total) by mouth 2 (two) times daily.   furosemide 40 MG tablet Commonly known as: LASIX TAKE 1 TABLET BY MOUTH IN  THE MORNING AND ONE-HALF  TABLET BY MOUTH AT NIGHT.  Needs to be seen for further refills.   levothyroxine 25 MCG tablet Commonly  known as: SYNTHROID Take 1 tablet (25 mcg total) by mouth daily before breakfast. TAKE 1 TABLET BY MOUTH  DAILY BEFORE BREAKFAST   loratadine 10 MG tablet Commonly known as: CLARITIN Take 10 mg by mouth daily.   losartan 100 MG tablet Commonly known as: COZAAR TAKE 1 TABLET BY MOUTH  EVERY MORNING   metFORMIN 500 MG tablet Commonly known as: GLUCOPHAGE Take 1 tablet (500 mg total) by mouth 2 (two) times daily with a meal. Give w/food.  Needs to be seen for further refills.   ONE TOUCH ULTRA TEST test strip Generic drug: glucose blood USE UP TO FOUR TIMES DAILY AS DIRECTED   OneTouch Delica Lancets Fine Misc USE AS DIRECTED UP TO FOUR TIMES DAILY   oxybutynin 5 MG tablet Commonly known as: DITROPAN Take 1 tablet (5 mg total) by mouth 3 (three) times daily. Needs to be seen for further refills   pantoprazole 40 MG tablet Commonly known as: PROTONIX Take 1 tablet (40 mg total) by mouth daily.   simvastatin 40 MG tablet Commonly known as: ZOCOR Take 1 tablet (40 mg total) by mouth daily at 6 PM. Needs to be seen for further refills.   solifenacin 10 MG tablet Commonly known as: VESICARE Take 1 tablet (10 mg total) by mouth daily.   Vitamin D-3 125 MCG (5000 UT) Tabs Take 5,000 tablets by mouth daily.       History (reviewed): Past Medical History:  Diagnosis Date  . Arthritis    Ostearthritis- hips, knees, fingers  . Breast cancer ([place]) 1997   pt denies  . DVT (deep venous thrombosis) ([place])   . Headache(784.0)    tx. Valproic acid  . Hypertension   . Hypothyroidism   . Pulmonary embolism [facility])    Past Surgical History:  Procedure Laterality Date  . ABDOMINAL HYSTERECTOMY    . CATARACT EXTRACTION, BILATERAL    . JOINT REPLACEMENT Left    [id number]  . MULTIPLE TOOTH EXTRACTIONS     60's  . PARATHYROIDECTOMY    . TOTAL HIP ARTHROPLASTY Right 10/29/2013   Procedure: RIGHT TOTAL HIP ARTHROPLASTY ANTERIOR APPROACH;  Surgeon: Mauri Pole, MD;  Location: [facility];   Service: Orthopedics;  Laterality: Right;   Family History  Problem Relation Age of Onset  . Colitis Sister 93       alive  . Cancer Sister 43       colon  . Cancer Mother 4       uterine  . Heart disease Father 80       heart failure  . Heart attack Brother   . Healthy Daughter   . Healthy Son   . Pulmonary embolism Brother   . Heart disease Brother   . Healthy Son   . Healthy Son    Social History   Socioeconomic History  . Marital status: Widowed    Spouse name: Not on file  . Number of children: 4  . Years of education: 81  . Highest education level: High school  graduate  Occupational History  . 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 Smoking status: Never Smoker  . Smokeless tobacco: Never Used  Substance and Sexual Activity  . Alcohol use: No    Alcohol/week: 0.0 standard drinks  . Drug use: No  . Sexual activity: Not Currently  Other Topics Concern  . Not on file  Social History Narrative   Patient is widowed and lives in a one story home. She has four adult children and one son lives with her.    Social Determinants of Health   Financial Resource Strain: Low Risk   . Difficulty of Paying Living Expenses: Not hard at all  Food Insecurity: No Food Insecurity  . Worried About Charity fundraiser in the Last Year: Never true  . Ran Out of Food in the Last Year: Never true  Transportation Needs: No Transportation Needs  . Lack of Transportation (Medical): No  . Lack of Transportation (Non-Medical): No  Physical Activity: Sufficiently Active  . Days of Exercise per Week: 7 days  . Minutes of Exercise per Session: 30 min  Stress: No Stress Concern Present  . Feeling of Stress : Not at all  Social Connections: Not Isolated  . Frequency of Communication with Friends and Family: More than three times a week  . Frequency of Social Gatherings with Friends and Family: More than three times a week  . Attends Religious Services: More  than 4 times per year  . Active Member of Clubs or Organizations: Yes  . Attends Archivist Meetings: More than 4 times per year  . Marital Status: Married    Activities of Daily Living In your present state of health, do you have any difficulty performing the following activities: 11/21/2019 11/04/2019  Hearing? N N  Vision? N N  Comment - cataracts removed in 2015  Difficulty concentrating or making decisions? N N  Walking or climbing stairs? Y Y  Comment knee pain some trouble due to arthritis in knees. Uses walker when outdoors  Dressing or bathing? N N  Doing errands, shopping? N N  Preparing Food and eating ? N N  Using the Toilet? N N  In the past six months, have you accidently leaked urine? Y N  Comment wears pads -  Do you have problems with loss of bowel control? N N  Managing your Medications? N N  Managing your Finances? N N  Housekeeping or managing your Housekeeping? N N  Some recent data might be hidden    Patient Education/ Literacy How often do you need to have someone help you when you read instructions, pamphlets, or other written materials from your doctor or pharmacy?: 1 - Never What is the last grade level you completed in school?: 12  Exercise Current Exercise Habits: The patient does not participate in regular exercise at present, Exercise limited by: orthopedic condition(s)  Diet Patient reports consuming 2 meals a day and 2 snack(s) a day Patient reports that her primary diet is: Low Sodium Patient reports that she does have regular access to food.   Depression Screen PHQ 2/9 Scores 05/14/2019 04/19/2019 08/30/2017 08/01/2017 10/17/2016 05/16/2016  PHQ - 2 Score 0 0 0 0 0 0     Fall Risk Fall Risk  11/04/2019 05/14/2019 04/19/2019 08/30/2017 08/01/2017  Falls in the past year? 0 0 0 No No  Number falls in past yr: 0 - - - -  Injury with Fall? 0 - - - -  Risk for fall due to : Impaired mobility - Impaired balance/gait;Impaired mobility - -  Risk  for fall due to: Comment knee pain/weakness - - - -  Follow up Falls prevention discussed - - - -     Objective:  [name] [name] seemed alert and oriented and she participated appropriately during our telephone visit.  Blood Pressure Weight BMI  BP Readings from Last 3 Encounters:  05/14/19 (!) 156/78  04/19/19 (!) 160/83  04/04/19 120/60   Wt Readings from Last 3 Encounters:  05/14/19 225 lb (102.1 kg)  04/19/19 225 lb 6.4 oz (102.2 kg)  04/02/19 232 lb (105.2 kg)   BMI Readings from Last 1 Encounters:  05/14/19 39.86 kg/m    *Unable to obtain current vital signs, weight, and BMI due to telephone visit type  Hearing/Vision  . Madine did not seem to have difficulty with hearing/understanding during the telephone conversation . Reports that she has not had a formal eye exam by an eye care professional within the past year . Reports that she has not had a formal hearing evaluation within the past year *Unable to fully assess hearing and vision during telephone visit type  Cognitive Function: 6CIT Screen 11/21/2019  What Year? 0 points  What month? 0 points  What time? 0 points  Count back from 20 0 points  Months in reverse 0 points  Repeat phrase 0 points  Total Score 0   (Normal:0-7, Significant for Dysfunction: >8)  Normal Cognitive Function Screening: Yes   Immunization & Health Maintenance Record Immunization History  Administered Date(s) Administered  . Fluad Quad(high Dose 65+) 04/04/2019  . Influenza, High Dose Seasonal PF 05/16/2016, 08/01/2017, 08/28/2018  . Moderna SARS-COVID-2 Vaccination 09/18/2019, 10/16/2019    Health Maintenance  Topic Date Due  . OPHTHALMOLOGY EXAM  Never done  . PNA vac Low Risk Adult (1 of 2 - PCV13) Never done  . FOOT EXAM  10/17/2017  . HEMOGLOBIN A1C  10/17/2019  . TETANUS/TDAP  04/18/2020 (Originally 08/16/1959)  . INFLUENZA VACCINE  02/23/2020  . DEXA SCAN  Completed  . COVID-19 Vaccine  Completed       Assessment  This  is a routine wellness examination for [name] [name].  Health Maintenance: Due or Overdue Health Maintenance Due  Topic Date Due  . OPHTHALMOLOGY EXAM  Never done  . PNA vac Low Risk Adult (1 of 2 - PCV13) Never done  . FOOT EXAM  10/17/2017  . HEMOGLOBIN A1C  10/17/2019    [name] [name] does not need a referral for Community Assistance: Care Management:   no Social Work:    no Prescription Assistance:  no Nutrition/Diabetes Education:  no   Plan:  Personalized Goals Goals Addressed            This Visit's Progress   . Patient Stated       11/21/2019 AWV Goal: Exercise for General Health   Patient will verbalize understanding of the benefits of increased physical activity:  Exercising regularly is important. It will improve your overall fitness, flexibility, and endurance.  Regular exercise also will improve your overall health. It can help you control your weight, reduce stress, and improve your bone density.  Over the next year, patient will increase physical activity as tolerated with a goal of at least 150 minutes of moderate physical activity per week.   You can tell that you are exercising at a moderate intensity if your heart starts beating faster and you start breathing faster but can still  hold a conversation.  Moderate-intensity exercise ideas include:  Walking 1 mile (1.6 km) in about 15 minutes  Biking  Hiking  Golfing  Dancing  Water aerobics  Patient will verbalize understanding of everyday activities that increase physical activity by providing examples like the following: ? Yard work, such as: ? Pushing a Conservation officer, nature ? Raking and bagging leaves ? Washing your car ? Pushing a stroller ? Shoveling snow ? Gardening ? Washing windows or floors  Patient will be able to explain general safety guidelines for exercising:   Before you start a new exercise program, talk with your health care provider.  Do not exercise so much that you hurt yourself,  feel dizzy, or get very short of breath.  Wear comfortable clothes and wear shoes with good support.  Drink plenty of water while you exercise to prevent dehydration or heat stroke.  Work out until your breathing and your heartbeat get faster.       Personalized Health Maintenance & Screening Recommendations  Pneumococcal vaccine   Lung Cancer Screening Recommended: no (Low Dose CT Chest recommended if Age 32-80 years, 30 pack-year currently smoking OR have quit w/in past 15 years) Hepatitis C Screening recommended: no HIV Screening recommended: no  Advanced Directives: Written information was not prepared per patient's request.  Referrals & Orders No orders of the defined types were placed in this encounter.   Follow-up Plan . Follow-up with Loman Brooklyn, FNP as planned   I have personally reviewed and noted the following in the patient's chart:   . Medical and social history . Use of alcohol, tobacco or illicit drugs  . Current medications and supplements . Functional ability and status . Nutritional status . Physical activity . Advanced directives . List of other physicians . Hospitalizations, surgeries, and ER visits in previous 12 months . Vitals . Screenings to include cognitive, depression, and falls . Referrals and appointments  In addition, I have reviewed and discussed with [name] [name] certain preventive protocols, quality metrics, and best practice recommendations. A written personalized care plan for preventive services as well as general preventive health recommendations is available and can be mailed to the patient at her request.      Baldomero Lamy, LPN  [id number]

## 2019-11-22 ENCOUNTER — Telehealth: Payer: Self-pay

## 2019-11-22 NOTE — Telephone Encounter (Signed)
Patient called stating she has been having SOB and cough x 1-2 weeks. States that yesterday she was walking a short distance and got SOB and felt like she was going to pass out so she sat down.  She checked her bp and it was 139/95.  She sat for a few more mins and states she re took her bp and it was 129/67.  This morning her bp is 140/89 and she states she does not feel like she is going to pass out.  Since patient has sob and cough she can not be seen in office.  Advised patient to go to urgent care to be checked or offered a phone call with a provider but advised her that I would advise going to the urgent care.  Patient states she will monitor her symptoms and bp and if she feels like that again she will go get checked at er or urgent care.   FYI

## 2019-12-02 NOTE — Progress Notes (Signed)
Assessment & Plan:  1. Type 2 diabetes mellitus without complication, without long-term current use of insulin (HCC) Lab Results  Component Value Date   HGBA1C 6.2 12/03/2019   HGBA1C 5.9 (H) 04/19/2019   HGBA1C 5.7 08/30/2017    - Diabetes is at goal of A1c < 7. - Medications: continue current medications - Home glucose monitoring: continue monitoring fasting - Patient is currently taking a statin. Patient is taking an ACE-inhibitor/ARB.  - Last foot exam: 12/03/2019 - Last diabetic eye exam: 2015 - patient to schedule - Urine Microalbumin/Creat Ratio: 12/03/2019 - CMP14+EGFR - Lipid panel - Microalbumin / creatinine urine ratio - Bayer DCA Hb A1c Waived - metFORMIN (GLUCOPHAGE) 500 MG tablet; Take 1 tablet (500 mg total) by mouth 2 (two) times daily with a meal. Give w/food.  Dispense: 180 tablet; Refill: 1  2. Essential hypertension - Well controlled on current regimen.  - amLODipine (NORVASC) 5 MG tablet; Take 1 tablet (5 mg total) by mouth daily.  Dispense: 90 tablet; Refill: 1 - losartan (COZAAR) 100 MG tablet; Take 1 tablet (100 mg total) by mouth every morning.  Dispense: 90 tablet; Refill: 1  3. Hypothyroidism, unspecified type - Well controlled on current regimen.  - TSH - levothyroxine (SYNTHROID) 25 MCG tablet; Take 1 tablet (25 mcg total) by mouth daily before breakfast.  Dispense: 90 tablet; Refill: 1  4. Hyperlipidemia associated with type 2 diabetes mellitus ([place]) - Well controlled on current regimen.  - CMP14+EGFR - Lipid panel - simvastatin (ZOCOR) 40 MG tablet; Take 1 tablet (40 mg total) by mouth daily at 6 PM. Needs to be seen for further refills.  Dispense: 90 tablet; Refill: 1  5. Anemia, unspecified type - CBC with Differential/Platelet  6. Chronic gout without tophus, unspecified cause, unspecified site - Well controlled on current regimen.  - allopurinol (ZYLOPRIM) 100 MG tablet; Take 1 tablet (100 mg total) by mouth daily.  Dispense: 90 tablet;  Refill: 1  7. Primary osteoarthritis of both knees - Ambulatory referral to Orthopedic Surgery  8. Exertional shortness of breath - CBC with Differential/Platelet   Return in about 6 months (around 06/04/2020) for follow-up of chronic medication conditions.  [name], MSN, APRN, FNP-C [facility]  Subjective:    Patient ID: Janani Chamber, female    DOB: [birth year], 79 y.o.   MRN: [id number]  Patient Care Team: Loman Brooklyn, FNP as PCP - General (Family Medicine) Paralee Cancel, MD as Consulting Physician (Orthopedic Surgery) Ilean China, RN as Registered Nurse   Chief Complaint:  Chief Complaint  Patient presents with  . Establish Care    jones pt  . Diabetes    check up of chronic medical conditions    HPI: Elanah Osmanovic is a 79 y.o. female presenting on 12/03/2019 for Establish Care (jones pt) and Diabetes (check up of chronic medical conditions)  Diabetes: Patient presents for follow up of diabetes. Current symptoms include: none. Known diabetic complications: none. Medication compliance: yes. Current diet: in general, a "healthy" diet  . Current exercise: none. Home blood sugar records: 121-132 fasting. Is she  on ACE inhibitor or angiotensin II receptor blocker? Yes. Is she on a statin? Yes.   Lab Results  Component Value Date   HGBA1C 6.2 12/03/2019   HGBA1C 5.9 (H) 04/19/2019   HGBA1C 5.7 08/30/2017   Lab Results  Component Value Date   LDLCALC 83 04/19/2019   CREATININE 0.71 04/03/2019     New complaints: Patient was previously  prescribed Fosamax which she is no longer taking as it upset her stomach.   Patient is requesting a referral back to Dr. Alvan Dame for bilateral knee pain. She has been him in the past. A referral was previously placed but she never heard from them. States she had her home phone turned off.   Patient feels her hemoglobin is low. This has happened before. She feels like she gets short of breath too easily.     Social history:  Relevant past medical, surgical, family and social history reviewed and updated as indicated. Interim medical history since our last visit reviewed.  Allergies and medications reviewed and updated.  DATA REVIEWED: CHART IN EPIC  ROS: Negative unless specifically indicated above in HPI.    Current Outpatient Medications:  .  acetaminophen (TYLENOL) 325 MG tablet, Take 2 tablets (650 mg total) by mouth every 6 (six) hours as needed for mild pain or headache (or Fever >/= 101)., Disp: 12 tablet, Rfl: 0 .  allopurinol (ZYLOPRIM) 100 MG tablet, Take 1 tablet (100 mg total) by mouth daily., Disp: 90 tablet, Rfl: 1 .  amLODipine (NORVASC) 5 MG tablet, Take 1 tablet (5 mg total) by mouth daily., Disp: 90 tablet, Rfl: 1 .  apixaban (ELIQUIS) 5 MG TABS tablet, Take 1 tablet (5 mg total) by mouth 2 (two) times daily., Disp: 60 tablet, Rfl: 5 .  Cholecalciferol (VITAMIN D-3) 5000 UNITS TABS, Take 5,000 tablets by mouth daily., Disp: , Rfl:  .  diclofenac sodium (VOLTAREN) 1 % GEL, Apply 4 g topically 4 (four) times daily., Disp: 200 g, Rfl: 11 .  furosemide (LASIX) 20 MG tablet, Take 1 tablet (20 mg total) by mouth 2 (two) times daily., Disp: 180 tablet, Rfl: 1 .  levothyroxine (SYNTHROID) 25 MCG tablet, Take 1 tablet (25 mcg total) by mouth daily before breakfast., Disp: 90 tablet, Rfl: 1 .  loratadine (CLARITIN) 10 MG tablet, Take 10 mg by mouth daily., Disp: , Rfl:  .  losartan (COZAAR) 100 MG tablet, Take 1 tablet (100 mg total) by mouth every morning., Disp: 90 tablet, Rfl: 1 .  metFORMIN (GLUCOPHAGE) 500 MG tablet, Take 1 tablet (500 mg total) by mouth 2 (two) times daily with a meal. Give w/food., Disp: 180 tablet, Rfl: 1 .  ONE TOUCH ULTRA TEST test strip, USE UP TO FOUR TIMES DAILY AS DIRECTED, Disp: 400 each, Rfl: 3 .  oxybutynin (DITROPAN) 5 MG tablet, Take 1 tablet (5 mg total) by mouth 3 (three) times daily. Needs to be seen for further refills, Disp: 270 tablet, Rfl:  0 .  pantoprazole (PROTONIX) 40 MG tablet, Take 1 tablet (40 mg total) by mouth daily., Disp: 90 tablet, Rfl: 1 .  simvastatin (ZOCOR) 40 MG tablet, Take 1 tablet (40 mg total) by mouth daily at 6 PM. Needs to be seen for further refills., Disp: 90 tablet, Rfl: 1   No Known Allergies Past Medical History:  Diagnosis Date  . Arthritis    Ostearthritis- hips, knees, fingers  . Breast cancer ([place]) 1997   pt denies  . DVT (deep venous thrombosis) ([place])   . Headache(784.0)    tx. Valproic acid  . Hypertension   . Hypothyroidism   . Pulmonary embolism [facility])     Past Surgical History:  Procedure Laterality Date  . ABDOMINAL HYSTERECTOMY    . CATARACT EXTRACTION, BILATERAL    . JOINT REPLACEMENT Left    [id number]  . MULTIPLE TOOTH EXTRACTIONS     60's  .  PARATHYROIDECTOMY    . TOTAL HIP ARTHROPLASTY Right 10/29/2013   Procedure: RIGHT TOTAL HIP ARTHROPLASTY ANTERIOR APPROACH;  Surgeon: Mauri Pole, MD;  Location: [facility];  Service: Orthopedics;  Laterality: Right;    Social History   Socioeconomic History  . Marital status: Widowed    Spouse name: Not on file  . Number of children: 4  . Years of education: 41  . Highest education level: High school graduate  Occupational History  . 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 Smoking status: Never Smoker  . Smokeless tobacco: Never Used  Substance and Sexual Activity  . Alcohol use: No    Alcohol/week: 0.0 standard drinks  . Drug use: No  . Sexual activity: Not Currently  Other Topics Concern  . Not on file  Social History Narrative   Patient is widowed and lives in a one story home. She has four adult children and one son lives with her.    Social Determinants of Health   Financial Resource Strain: Low Risk   . Difficulty of Paying Living Expenses: Not hard at all  Food Insecurity: No Food Insecurity  . Worried About Charity fundraiser in the Last Year: Never true  . Ran Out of Food in the Last  Year: Never true  Transportation Needs: No Transportation Needs  . Lack of Transportation (Medical): No  . Lack of Transportation (Non-Medical): No  Physical Activity: Sufficiently Active  . Days of Exercise per Week: 7 days  . Minutes of Exercise per Session: 30 min  Stress: No Stress Concern Present  . Feeling of Stress : Not at all  Social Connections: Not Isolated  . Frequency of Communication with Friends and Family: More than three times a week  . Frequency of Social Gatherings with Friends and Family: More than three times a week  . Attends Religious Services: More than 4 times per year  . Active Member of Clubs or Organizations: Yes  . Attends Archivist Meetings: More than 4 times per year  . Marital Status: Married  Human resources officer Violence: Not At Risk  . Fear of Current or Ex-Partner: No  . Emotionally Abused: No  . Physically Abused: No  . Sexually Abused: No        Objective:    BP (!) 142/76   Pulse 87   Temp 98.1 F (36.7 C) (Temporal)   Ht 5' 3"  (1.6 m)   Wt 214 lb (97.1 kg)   SpO2 96%   BMI 37.91 kg/m   Wt Readings from Last 3 Encounters:  12/03/19 214 lb (97.1 kg)  05/14/19 225 lb (102.1 kg)  04/19/19 225 lb 6.4 oz (102.2 kg)    Physical Exam Vitals reviewed.  Constitutional:      General: She is not in acute distress.    Appearance: Normal appearance. She is obese. She is not ill-appearing, toxic-appearing or diaphoretic.  HENT:     Head: Normocephalic and atraumatic.  Eyes:     General: No scleral icterus.       Right eye: No discharge.        Left eye: No discharge.     Conjunctiva/sclera: Conjunctivae normal.  Cardiovascular:     Rate and Rhythm: Normal rate and regular rhythm.     Heart sounds: Normal heart sounds. No murmur. No friction rub. No gallop.   Pulmonary:     Effort: Pulmonary effort is normal. No respiratory distress.  Breath sounds: Normal breath sounds. No stridor. No wheezing, rhonchi or rales.    Musculoskeletal:        General: Normal range of motion.     Cervical back: Normal range of motion.  Skin:    General: Skin is warm and dry.     Capillary Refill: Capillary refill takes less than 2 seconds.  Neurological:     General: No focal deficit present.     Mental Status: She is alert and oriented to person, place, and time. Mental status is at baseline.     Gait: Gait abnormal (ambulates with walker).  Psychiatric:        Mood and Affect: Mood normal.        Behavior: Behavior normal.        Thought Content: Thought content normal.        Judgment: Judgment normal.    Diabetic Foot Exam - Simple   Simple Foot Form Diabetic Foot exam was performed with the following findings: Yes 12/03/2019  8:27 AM  Visual Inspection No deformities, no ulcerations, no other skin breakdown bilaterally: Yes Sensation Testing Intact to touch and monofilament testing bilaterally: Yes Pulse Check Posterior Tibialis and Dorsalis pulse intact bilaterally: Yes Comments     Lab Results  Component Value Date   TSH 1.789 04/03/2019   Lab Results  Component Value Date   WBC 9.1 04/04/2019   HGB 9.6 (L) 04/04/2019   HCT 31.9 (L) 04/04/2019   MCV 79.9 (L) 04/04/2019   PLT 224 04/04/2019   Lab Results  Component Value Date   NA 136 04/03/2019   K 4.0 04/03/2019   CO2 24 04/03/2019   GLUCOSE 124 (H) 04/03/2019   BUN 16 04/03/2019   CREATININE 0.71 04/03/2019   BILITOT 0.7 04/03/2019   ALKPHOS 102 04/03/2019   AST 13 (L) 04/03/2019   ALT 17 04/03/2019   PROT 7.2 04/03/2019   ALBUMIN 4.2 04/03/2019   CALCIUM 10.1 04/03/2019   ANIONGAP 8 04/03/2019   Lab Results  Component Value Date   CHOL 151 04/19/2019   Lab Results  Component Value Date   HDL 49 04/19/2019   Lab Results  Component Value Date   LDLCALC 83 04/19/2019   Lab Results  Component Value Date   TRIG 104 04/19/2019   Lab Results  Component Value Date   CHOLHDL 3.1 04/19/2019   Lab Results  Component  Value Date   HGBA1C 6.2 12/03/2019

## 2019-12-03 ENCOUNTER — Other Ambulatory Visit: Payer: Self-pay

## 2019-12-03 ENCOUNTER — Encounter: Payer: Self-pay | Admitting: Family Medicine

## 2019-12-03 ENCOUNTER — Ambulatory Visit (INDEPENDENT_AMBULATORY_CARE_PROVIDER_SITE_OTHER): Payer: Medicare Other | Admitting: Family Medicine

## 2019-12-03 ENCOUNTER — Other Ambulatory Visit: Payer: Medicare Other

## 2019-12-03 VITALS — BP 142/76 | HR 87 | Temp 98.1°F | Ht 63.0 in | Wt 214.0 lb

## 2019-12-03 DIAGNOSIS — I10 Essential (primary) hypertension: Secondary | ICD-10-CM | POA: Diagnosis not present

## 2019-12-03 DIAGNOSIS — E039 Hypothyroidism, unspecified: Secondary | ICD-10-CM

## 2019-12-03 DIAGNOSIS — E1169 Type 2 diabetes mellitus with other specified complication: Secondary | ICD-10-CM | POA: Diagnosis not present

## 2019-12-03 DIAGNOSIS — E785 Hyperlipidemia, unspecified: Secondary | ICD-10-CM | POA: Diagnosis not present

## 2019-12-03 DIAGNOSIS — D649 Anemia, unspecified: Secondary | ICD-10-CM | POA: Diagnosis not present

## 2019-12-03 DIAGNOSIS — R0602 Shortness of breath: Secondary | ICD-10-CM

## 2019-12-03 DIAGNOSIS — E119 Type 2 diabetes mellitus without complications: Secondary | ICD-10-CM

## 2019-12-03 DIAGNOSIS — M1A9XX0 Chronic gout, unspecified, without tophus (tophi): Secondary | ICD-10-CM

## 2019-12-03 DIAGNOSIS — M170 Bilateral primary osteoarthritis of knee: Secondary | ICD-10-CM

## 2019-12-03 DIAGNOSIS — Z23 Encounter for immunization: Secondary | ICD-10-CM

## 2019-12-03 LAB — BAYER DCA HB A1C WAIVED: HB A1C (BAYER DCA - WAIVED): 6.2 % (ref <7.0)

## 2019-12-03 MED ORDER — ALLOPURINOL 100 MG PO TABS: 100.0000 mg | ORAL_TABLET | Freq: Every day | ORAL | 1 refills | Status: DC

## 2019-12-03 MED ORDER — FUROSEMIDE 20 MG PO TABS: 20.0000 mg | ORAL_TABLET | Freq: Two times a day (BID) | ORAL | 1 refills | Status: DC

## 2019-12-03 MED ORDER — SIMVASTATIN 40 MG PO TABS: 40.0000 mg | ORAL_TABLET | Freq: Every day | ORAL | 1 refills | Status: DC

## 2019-12-03 MED ORDER — AMLODIPINE BESYLATE 5 MG PO TABS: 5.0000 mg | ORAL_TABLET | Freq: Every day | ORAL | 1 refills | Status: DC

## 2019-12-03 MED ORDER — LOSARTAN POTASSIUM 100 MG PO TABS: 100.0000 mg | ORAL_TABLET | Freq: Every morning | ORAL | 1 refills | Status: DC

## 2019-12-03 MED ORDER — PANTOPRAZOLE SODIUM 40 MG PO TBEC: 40.0000 mg | DELAYED_RELEASE_TABLET | Freq: Every day | ORAL | 1 refills | Status: DC

## 2019-12-03 MED ORDER — LEVOTHYROXINE SODIUM 25 MCG PO TABS: 25.0000 ug | ORAL_TABLET | Freq: Every day | ORAL | 1 refills | Status: DC

## 2019-12-03 MED ORDER — METFORMIN HCL 500 MG PO TABS: 500.0000 mg | ORAL_TABLET | Freq: Two times a day (BID) | ORAL | 1 refills | Status: DC

## 2019-12-04 LAB — CBC WITH DIFFERENTIAL/PLATELET
Basophils Absolute: 0 10*3/uL (ref 0.0–0.2)
Basos: 1 %
EOS (ABSOLUTE): 0.1 10*3/uL (ref 0.0–0.4)
Eos: 2 %
Hematocrit: 25.3 % — ABNORMAL LOW (ref 34.0–46.6)
Hemoglobin: 6.9 g/dL — CL (ref 11.1–15.9)
Immature Grans (Abs): 0 10*3/uL (ref 0.0–0.1)
Immature Granulocytes: 0 %
Lymphocytes Absolute: 2 10*3/uL (ref 0.7–3.1)
Lymphs: 34 %
MCH: 19.3 pg — ABNORMAL LOW (ref 26.6–33.0)
MCHC: 27.3 g/dL — ABNORMAL LOW (ref 31.5–35.7)
MCV: 71 fL — ABNORMAL LOW (ref 79–97)
Monocytes Absolute: 0.7 10*3/uL (ref 0.1–0.9)
Monocytes: 11 %
Neutrophils Absolute: 3.1 10*3/uL (ref 1.4–7.0)
Neutrophils: 52 %
Platelets: 387 10*3/uL (ref 150–450)
RBC: 3.57 x10E6/uL — ABNORMAL LOW (ref 3.77–5.28)
RDW: 15.5 % — ABNORMAL HIGH (ref 11.7–15.4)
WBC: 6 10*3/uL (ref 3.4–10.8)

## 2019-12-04 LAB — CMP14+EGFR
ALT: 12 IU/L (ref 0–32)
AST: 15 IU/L (ref 0–40)
Albumin/Globulin Ratio: 2.3 — ABNORMAL HIGH (ref 1.2–2.2)
Albumin: 4.5 g/dL (ref 3.7–4.7)
Alkaline Phosphatase: 73 IU/L (ref 39–117)
BUN/Creatinine Ratio: 21 (ref 12–28)
BUN: 20 mg/dL (ref 8–27)
Bilirubin Total: 0.3 mg/dL (ref 0.0–1.2)
CO2: 21 mmol/L (ref 20–29)
Calcium: 11.1 mg/dL — ABNORMAL HIGH (ref 8.7–10.3)
Chloride: 106 mmol/L (ref 96–106)
Creatinine, Ser: 0.95 mg/dL (ref 0.57–1.00)
GFR calc Af Amer: 66 mL/min/{1.73_m2} (ref >59)
GFR calc non Af Amer: 58 mL/min/{1.73_m2} — ABNORMAL LOW (ref >59)
Globulin, Total: 2 g/dL (ref 1.5–4.5)
Glucose: 106 mg/dL — ABNORMAL HIGH (ref 65–99)
Potassium: 4.6 mmol/L (ref 3.5–5.2)
Sodium: 141 mmol/L (ref 134–144)
Total Protein: 6.5 g/dL (ref 6.0–8.5)

## 2019-12-04 LAB — MICROALBUMIN / CREATININE URINE RATIO
Creatinine, Urine: 47.5 mg/dL
Microalb/Creat Ratio: 214 mg/g creat — ABNORMAL HIGH (ref 0–29)
Microalbumin, Urine: 101.5 ug/mL

## 2019-12-04 LAB — TSH: TSH: 2.89 u[IU]/mL (ref 0.450–4.500)

## 2019-12-04 LAB — LIPID PANEL
Chol/HDL Ratio: 2.4 ratio (ref 0.0–4.4)
Cholesterol, Total: 133 mg/dL (ref 100–199)
HDL: 56 mg/dL (ref >39)
LDL Chol Calc (NIH): 59 mg/dL (ref 0–99)
Triglycerides: 97 mg/dL (ref 0–149)
VLDL Cholesterol Cal: 18 mg/dL (ref 5–40)

## 2019-12-06 ENCOUNTER — Other Ambulatory Visit (HOSPITAL_COMMUNITY): Payer: Medicare Other

## 2019-12-06 ENCOUNTER — Telehealth: Payer: Self-pay | Admitting: *Deleted

## 2019-12-06 ENCOUNTER — Encounter (HOSPITAL_COMMUNITY): Payer: Self-pay | Admitting: *Deleted

## 2019-12-06 ENCOUNTER — Other Ambulatory Visit: Payer: Self-pay

## 2019-12-06 ENCOUNTER — Inpatient Hospital Stay (HOSPITAL_COMMUNITY)
Admission: EM | Admit: 2019-12-06 | Discharge: 2019-12-09 | DRG: 378 | Disposition: A | Discharge status: Home / Self Care | Payer: Medicare Other | Attending: Family Medicine | Admitting: Family Medicine

## 2019-12-06 DIAGNOSIS — I2699 Other pulmonary embolism without acute cor pulmonale: Secondary | ICD-10-CM | POA: Diagnosis present

## 2019-12-06 DIAGNOSIS — M1A9XX0 Chronic gout, unspecified, without tophus (tophi): Secondary | ICD-10-CM | POA: Diagnosis present

## 2019-12-06 DIAGNOSIS — R195 Other fecal abnormalities: Secondary | ICD-10-CM | POA: Insufficient documentation

## 2019-12-06 DIAGNOSIS — E119 Type 2 diabetes mellitus without complications: Secondary | ICD-10-CM

## 2019-12-06 DIAGNOSIS — E785 Hyperlipidemia, unspecified: Secondary | ICD-10-CM | POA: Diagnosis present

## 2019-12-06 DIAGNOSIS — D649 Anemia, unspecified: Secondary | ICD-10-CM | POA: Diagnosis present

## 2019-12-06 DIAGNOSIS — Z20822 Contact with and (suspected) exposure to covid-19: Secondary | ICD-10-CM | POA: Diagnosis present

## 2019-12-06 DIAGNOSIS — K921 Melena: Secondary | ICD-10-CM | POA: Diagnosis not present

## 2019-12-06 DIAGNOSIS — Z7901 Long term (current) use of anticoagulants: Secondary | ICD-10-CM | POA: Diagnosis not present

## 2019-12-06 DIAGNOSIS — Z86711 Personal history of pulmonary embolism: Secondary | ICD-10-CM | POA: Diagnosis not present

## 2019-12-06 DIAGNOSIS — Z8249 Family history of ischemic heart disease and other diseases of the circulatory system: Secondary | ICD-10-CM | POA: Diagnosis not present

## 2019-12-06 DIAGNOSIS — E039 Hypothyroidism, unspecified: Secondary | ICD-10-CM | POA: Diagnosis present

## 2019-12-06 DIAGNOSIS — K5730 Diverticulosis of large intestine without perforation or abscess without bleeding: Secondary | ICD-10-CM | POA: Diagnosis present

## 2019-12-06 DIAGNOSIS — K3189 Other diseases of stomach and duodenum: Secondary | ICD-10-CM | POA: Diagnosis not present

## 2019-12-06 DIAGNOSIS — Z79899 Other long term (current) drug therapy: Secondary | ICD-10-CM

## 2019-12-06 DIAGNOSIS — M1990 Unspecified osteoarthritis, unspecified site: Secondary | ICD-10-CM | POA: Diagnosis not present

## 2019-12-06 DIAGNOSIS — D500 Iron deficiency anemia secondary to blood loss (chronic): Secondary | ICD-10-CM | POA: Diagnosis not present

## 2019-12-06 DIAGNOSIS — K635 Polyp of colon: Secondary | ICD-10-CM | POA: Diagnosis not present

## 2019-12-06 DIAGNOSIS — I152 Hypertension secondary to endocrine disorders: Secondary | ICD-10-CM | POA: Diagnosis present

## 2019-12-06 DIAGNOSIS — Q438 Other specified congenital malformations of intestine: Secondary | ICD-10-CM | POA: Diagnosis not present

## 2019-12-06 DIAGNOSIS — E1169 Type 2 diabetes mellitus with other specified complication: Secondary | ICD-10-CM | POA: Diagnosis present

## 2019-12-06 DIAGNOSIS — E890 Postprocedural hypothyroidism: Secondary | ICD-10-CM | POA: Diagnosis not present

## 2019-12-06 DIAGNOSIS — Z86718 Personal history of other venous thrombosis and embolism: Secondary | ICD-10-CM | POA: Diagnosis not present

## 2019-12-06 DIAGNOSIS — I10 Essential (primary) hypertension: Secondary | ICD-10-CM | POA: Diagnosis present

## 2019-12-06 DIAGNOSIS — K922 Gastrointestinal hemorrhage, unspecified: Secondary | ICD-10-CM | POA: Diagnosis not present

## 2019-12-06 DIAGNOSIS — K219 Gastro-esophageal reflux disease without esophagitis: Secondary | ICD-10-CM | POA: Diagnosis not present

## 2019-12-06 DIAGNOSIS — Z96641 Presence of right artificial hip joint: Secondary | ICD-10-CM | POA: Diagnosis present

## 2019-12-06 DIAGNOSIS — M109 Gout, unspecified: Secondary | ICD-10-CM | POA: Diagnosis present

## 2019-12-06 DIAGNOSIS — N1832 Chronic kidney disease, stage 3b: Secondary | ICD-10-CM

## 2019-12-06 DIAGNOSIS — Z7984 Long term (current) use of oral hypoglycemic drugs: Secondary | ICD-10-CM | POA: Diagnosis not present

## 2019-12-06 DIAGNOSIS — Z03818 Encounter for observation for suspected exposure to other biological agents ruled out: Secondary | ICD-10-CM | POA: Diagnosis not present

## 2019-12-06 DIAGNOSIS — D179 Benign lipomatous neoplasm, unspecified: Secondary | ICD-10-CM | POA: Diagnosis not present

## 2019-12-06 DIAGNOSIS — D509 Iron deficiency anemia, unspecified: Secondary | ICD-10-CM | POA: Diagnosis present

## 2019-12-06 DIAGNOSIS — D62 Acute posthemorrhagic anemia: Secondary | ICD-10-CM | POA: Diagnosis not present

## 2019-12-06 DIAGNOSIS — Z79890 Hormone replacement therapy (postmenopausal): Secondary | ICD-10-CM

## 2019-12-06 LAB — CBC WITH DIFFERENTIAL/PLATELET
Abs Immature Granulocytes: 0.03 10*3/uL (ref 0.00–0.07)
Basophils Absolute: 0 10*3/uL (ref 0.0–0.1)
Basophils Relative: 0 %
Eosinophils Absolute: 0.1 10*3/uL (ref 0.0–0.5)
Eosinophils Relative: 2 %
HCT: 24.1 % — ABNORMAL LOW (ref 36.0–46.0)
Hemoglobin: 6.7 g/dL — CL (ref 12.0–15.0)
Immature Granulocytes: 0 %
Lymphocytes Relative: 31 %
Lymphs Abs: 2.1 10*3/uL (ref 0.7–4.0)
MCH: 19.1 pg — ABNORMAL LOW (ref 26.0–34.0)
MCHC: 27.8 g/dL — ABNORMAL LOW (ref 30.0–36.0)
MCV: 68.7 fL — ABNORMAL LOW (ref 80.0–100.0)
Monocytes Absolute: 0.9 10*3/uL (ref 0.1–1.0)
Monocytes Relative: 13 %
Neutro Abs: 3.7 10*3/uL (ref 1.7–7.7)
Neutrophils Relative %: 54 %
Platelets: 350 10*3/uL (ref 150–400)
RBC: 3.51 MIL/uL — ABNORMAL LOW (ref 3.87–5.11)
RDW: 17.1 % — ABNORMAL HIGH (ref 11.5–15.5)
WBC: 7 10*3/uL (ref 4.0–10.5)
nRBC: 0 % (ref 0.0–0.2)

## 2019-12-06 LAB — PREPARE RBC (CROSSMATCH)

## 2019-12-06 LAB — PROTIME-INR
INR: 1.3 — ABNORMAL HIGH (ref 0.8–1.2)
Prothrombin Time: 16.1 seconds — ABNORMAL HIGH (ref 11.4–15.2)

## 2019-12-06 LAB — BASIC METABOLIC PANEL
Anion gap: 8 (ref 5–15)
BUN: 21 mg/dL (ref 8–23)
CO2: 24 mmol/L (ref 22–32)
Calcium: 9.9 mg/dL (ref 8.9–10.3)
Chloride: 105 mmol/L (ref 98–111)
Creatinine, Ser: 0.96 mg/dL (ref 0.44–1.00)
GFR calc Af Amer: >60 mL/min (ref >60)
GFR calc non Af Amer: 57 mL/min — ABNORMAL LOW (ref >60)
Glucose, Bld: 108 mg/dL — ABNORMAL HIGH (ref 70–99)
Potassium: 3.5 mmol/L (ref 3.5–5.1)
Sodium: 137 mmol/L (ref 135–145)

## 2019-12-06 LAB — GLUCOSE, CAPILLARY: Glucose-Capillary: 108 mg/dL — ABNORMAL HIGH (ref 70–99)

## 2019-12-06 LAB — IRON AND TIBC
Iron: 14 ug/dL — ABNORMAL LOW (ref 28–170)
Saturation Ratios: 3 % — ABNORMAL LOW (ref 10.4–31.8)
TIBC: 506 ug/dL — ABNORMAL HIGH (ref 250–450)
UIBC: 492 ug/dL

## 2019-12-06 LAB — RETICULOCYTES
Immature Retic Fract: 33 % — ABNORMAL HIGH (ref 2.3–15.9)
RBC.: 3.44 MIL/uL — ABNORMAL LOW (ref 3.87–5.11)
Retic Count, Absolute: 64 10*3/uL (ref 19.0–186.0)
Retic Ct Pct: 1.9 % (ref 0.4–3.1)

## 2019-12-06 LAB — FERRITIN: Ferritin: 3 ng/mL — ABNORMAL LOW (ref 11–307)

## 2019-12-06 LAB — ABO/RH: ABO/RH(D): O POS

## 2019-12-06 LAB — FOLATE: Folate: 19.7 ng/mL (ref >5.9)

## 2019-12-06 LAB — VITAMIN B12: Vitamin B-12: 73 pg/mL — ABNORMAL LOW (ref 180–914)

## 2019-12-06 LAB — SARS CORONAVIRUS 2 BY RT PCR (HOSPITAL ORDER, PERFORMED IN ~~LOC~~ HOSPITAL LAB): SARS Coronavirus 2: NEGATIVE

## 2019-12-06 MED ORDER — SODIUM CHLORIDE 0.9 % IV SOLN: 10.0000 mL/h | Freq: Once | INTRAVENOUS | Status: DC

## 2019-12-06 MED ORDER — ALLOPURINOL 100 MG PO TABS
100.0000 mg | ORAL_TABLET | Freq: Every day | ORAL | Status: DC
  Administered 2019-12-07 – 2019-12-09 (×3): 100 mg via ORAL
  Filled 2019-12-06 (×3): qty 1

## 2019-12-06 MED ORDER — AMLODIPINE BESYLATE 5 MG PO TABS
5.0000 mg | ORAL_TABLET | Freq: Every day | ORAL | Status: DC
  Administered 2019-12-07 – 2019-12-09 (×3): 5 mg via ORAL
  Filled 2019-12-06 (×3): qty 1

## 2019-12-06 MED ORDER — PANTOPRAZOLE SODIUM 40 MG PO TBEC
40.0000 mg | DELAYED_RELEASE_TABLET | Freq: Every day | ORAL | Status: DC
  Administered 2019-12-07 – 2019-12-09 (×3): 40 mg via ORAL
  Filled 2019-12-06 (×3): qty 1

## 2019-12-06 MED ORDER — ACETAMINOPHEN 325 MG PO TABS
650.0000 mg | ORAL_TABLET | Freq: Four times a day (QID) | ORAL | Status: DC | PRN
  Administered 2019-12-09: 650 mg via ORAL
  Filled 2019-12-06: qty 2

## 2019-12-06 MED ORDER — FUROSEMIDE 20 MG PO TABS
20.0000 mg | ORAL_TABLET | Freq: Two times a day (BID) | ORAL | Status: DC
  Administered 2019-12-07 – 2019-12-09 (×4): 20 mg via ORAL
  Filled 2019-12-06 (×5): qty 1

## 2019-12-06 MED ORDER — LOSARTAN POTASSIUM 50 MG PO TABS
100.0000 mg | ORAL_TABLET | Freq: Every morning | ORAL | Status: DC
  Administered 2019-12-07 – 2019-12-09 (×3): 100 mg via ORAL
  Filled 2019-12-06 (×4): qty 2

## 2019-12-06 MED ORDER — SIMVASTATIN 20 MG PO TABS
40.0000 mg | ORAL_TABLET | Freq: Every day | ORAL | Status: DC
  Administered 2019-12-06 – 2019-12-08 (×3): 40 mg via ORAL
  Filled 2019-12-06 (×4): qty 2

## 2019-12-06 MED ORDER — INSULIN ASPART 100 UNIT/ML ~~LOC~~ SOLN
0.0000 [IU] | Freq: Three times a day (TID) | SUBCUTANEOUS | Status: DC
  Administered 2019-12-08: 3 [IU] via SUBCUTANEOUS

## 2019-12-06 MED ORDER — LEVOTHYROXINE SODIUM 25 MCG PO TABS
25.0000 ug | ORAL_TABLET | Freq: Every day | ORAL | Status: DC
  Administered 2019-12-07 – 2019-12-09 (×2): 25 ug via ORAL
  Filled 2019-12-06 (×2): qty 1

## 2019-12-06 MED ORDER — VITAMIN D 25 MCG (1000 UNIT) PO TABS
5000.0000 [IU] | ORAL_TABLET | Freq: Every day | ORAL | Status: DC
  Administered 2019-12-07 – 2019-12-09 (×3): 5000 [IU] via ORAL
  Filled 2019-12-06 (×3): qty 5

## 2019-12-06 MED ORDER — OXYBUTYNIN CHLORIDE 5 MG PO TABS
5.0000 mg | ORAL_TABLET | Freq: Three times a day (TID) | ORAL | Status: DC
  Administered 2019-12-06 – 2019-12-09 (×7): 5 mg via ORAL
  Filled 2019-12-06 (×8): qty 1

## 2019-12-06 NOTE — Plan of Care (Signed)

## 2019-12-06 NOTE — ED Provider Notes (Signed)
[facility] EMERGENCY DEPARTMENT Provider Note   CSN: [id number] Arrival date & time: 12/06/19  1250     History Chief Complaint  Patient presents with  . Abnormal Lab    [name] [name] is a 79 y.o. female.  [name] [name] is a 79 y.o. female with history of hypertension, hypothyroidism, PE and DVT on anticoagulation, who presents to the emergency department for evaluation of low hemoglobin.  She states she was seen by her PCP on 5/11 for routine checkup, but reported that she has been feeling fatigued with some lightheadedness and shortness of breath over the past 2 weeks.  In September patient was found to have a low hemoglobin at 9 with unclear cause, they did blood work during her office visit on 5/11 she was called today and notified that her hemoglobin was 6.9.  He relationally trying to schedule outpatient transfusion, but would not be able to have this done until Monday at the earliest, so she was directed to the ED.  She denies focal bleeding symptoms, has not noticed any melena or hematochezia.  No hematuria.  No bruising or joint swelling.  Reports she has been taking her Eliquis regularly.  Reports worsening lightheadedness but no syncopal episodes.  Shortness of breath primarily with exertion.  No associated chest pain.  She reports that they have not checked her stools for blood at PCPs office, she does have a family history of colon cancer, has not had a colonoscopy in 4 years.        Past Medical History:  Diagnosis Date  . Arthritis    Ostearthritis- hips, knees, fingers  . Breast cancer ([place]) 1997   pt denies  . DVT (deep venous thrombosis) ([place])   . Headache(784.0)    tx. Valproic acid  . Hypertension   . Hypothyroidism   . Pulmonary embolism [facility])     Patient Active Problem List   Diagnosis Date Noted  . Primary osteoarthritis of both knees 05/14/2019  . Pulmonary embolism ([place]) 04/03/2019  . Sciatica of right side 10/18/2018  . Seborrheic keratosis 10/17/2016    . Hyperlipidemia associated with type 2 diabetes mellitus ([place]) 10/17/2016  . Overactive bladder 10/17/2016  . Gastroesophageal reflux disease without esophagitis 10/17/2016  . Primary osteoarthritis involving multiple joints 10/17/2016  . Chronic gout without tophus 10/17/2016  . Type 2 diabetes mellitus without complication, without long-term current use of insulin ([place]) 05/16/2016  . Essential hypertension 05/16/2016  . Hypothyroidism 05/16/2016  . Expected blood loss anemia 10/30/2013  . Morbid obesity ([place]) 10/30/2013  . S/P right THA, AA 10/29/2013    Past Surgical History:  Procedure Laterality Date  . ABDOMINAL HYSTERECTOMY    . CATARACT EXTRACTION, BILATERAL    . JOINT REPLACEMENT Left    [id number]  . MULTIPLE TOOTH EXTRACTIONS     60's  . PARATHYROIDECTOMY    . TOTAL HIP ARTHROPLASTY Right 10/29/2013   Procedure: RIGHT TOTAL HIP ARTHROPLASTY ANTERIOR APPROACH;  Surgeon: Mauri Pole, MD;  Location: [facility];  Service: Orthopedics;  Laterality: Right;     OB History   No obstetric history on file.     Family History  Problem Relation Age of Onset  . Colitis Sister 12       alive  . Cancer Sister 36       colon  . Cancer Mother 54       uterine  . Heart disease Father 64       heart failure  . Heart attack  Brother   . Healthy Daughter   . Healthy Son   . Pulmonary embolism Brother   . Heart disease Brother   . Healthy Son   . Healthy Son     Social History   Tobacco Use  . Smoking status: Never Smoker  . Smokeless tobacco: Never Used  Substance Use Topics  . Alcohol use: No    Alcohol/week: 0.0 standard drinks  . Drug use: No    Home Medications Prior to Admission medications   Medication Sig Start Date End Date Taking? Authorizing Provider  acetaminophen (TYLENOL) 325 MG tablet Take 2 tablets (650 mg total) by mouth every 6 (six) hours as needed for mild pain or headache (or Fever >/= 101). 04/04/19   Roxan Hockey, MD  allopurinol (ZYLOPRIM)  100 MG tablet Take 1 tablet (100 mg total) by mouth daily. 12/03/19   Loman Brooklyn, FNP  amLODipine (NORVASC) 5 MG tablet Take 1 tablet (5 mg total) by mouth daily. 12/03/19   Loman Brooklyn, FNP  apixaban (ELIQUIS) 5 MG TABS tablet Take 1 tablet (5 mg total) by mouth 2 (two) times daily. 11/19/19   Loman Brooklyn, FNP  Cholecalciferol (VITAMIN D-3) 5000 UNITS TABS Take 5,000 tablets by mouth daily.    [provider]  diclofenac sodium (VOLTAREN) 1 % GEL Apply 4 g topically 4 (four) times daily. 04/19/19   Terald Sleeper, PA-C  furosemide (LASIX) 20 MG tablet Take 1 tablet (20 mg total) by mouth 2 (two) times daily. 12/03/19   Loman Brooklyn, FNP  levothyroxine (SYNTHROID) 25 MCG tablet Take 1 tablet (25 mcg total) by mouth daily before breakfast. 12/03/19   Loman Brooklyn, FNP  loratadine (CLARITIN) 10 MG tablet Take 10 mg by mouth daily.    [provider]  losartan (COZAAR) 100 MG tablet Take 1 tablet (100 mg total) by mouth every morning. 12/03/19   Loman Brooklyn, FNP  metFORMIN (GLUCOPHAGE) 500 MG tablet Take 1 tablet (500 mg total) by mouth 2 (two) times daily with a meal. Give w/food. 12/03/19   Loman Brooklyn, FNP  ONE TOUCH ULTRA TEST test strip USE UP TO FOUR TIMES DAILY AS DIRECTED 10/04/17   Terald Sleeper, PA-C  oxybutynin (DITROPAN) 5 MG tablet Take 1 tablet (5 mg total) by mouth 3 (three) times daily. Needs to be seen for further refills 07/08/19   Terald Sleeper, PA-C  pantoprazole (PROTONIX) 40 MG tablet Take 1 tablet (40 mg total) by mouth daily. 12/03/19   Loman Brooklyn, FNP  simvastatin (ZOCOR) 40 MG tablet Take 1 tablet (40 mg total) by mouth daily at 6 PM. Needs to be seen for further refills. 12/03/19   Loman Brooklyn, FNP    Allergies    Patient has no known allergies.  Review of Systems   Review of Systems  Constitutional: Positive for fatigue. Negative for chills and fever.  HENT: Negative.   Respiratory: Positive for shortness of breath.  Negative for cough.   Cardiovascular: Negative for chest pain and leg swelling.  Gastrointestinal: Negative for anal bleeding, blood in stool, constipation, diarrhea, nausea and vomiting.  Genitourinary: Negative for dysuria and frequency.  Musculoskeletal: Negative for arthralgias and myalgias.  Skin: Negative for color change and rash.  Neurological: Positive for light-headedness. Negative for dizziness, syncope and headaches.  All other systems reviewed and are negative.   Physical Exam Updated Vital Signs BP (!) 151/60   Pulse 66   Temp 98.2  F (36.8 C)   Resp 20   SpO2 98%   Physical Exam Vitals and nursing note reviewed.  Constitutional:      General: She is not in acute distress.    Appearance: Normal appearance. She is well-developed. She is not ill-appearing or diaphoretic.     Comments: Well-appearing and in no distress.  HENT:     Head: Normocephalic and atraumatic.  Eyes:     General:        Right eye: No discharge.        Left eye: No discharge.     Comments: Conjunctivae pale  Cardiovascular:     Rate and Rhythm: Normal rate and regular rhythm.     Pulses: Normal pulses.     Heart sounds: Normal heart sounds. No murmur. No friction rub. No gallop.   Pulmonary:     Effort: Pulmonary effort is normal. No respiratory distress.     Breath sounds: Normal breath sounds. No wheezing or rales.     Comments: Respirations equal and unlabored, patient able to speak in full sentences, lungs clear to auscultation bilaterally Abdominal:     General: Bowel sounds are normal. There is no distension.     Palpations: Abdomen is soft. There is no mass.     Tenderness: There is no abdominal tenderness. There is no guarding.     Comments: Abdomen soft, nondistended, nontender to palpation in all quadrants without guarding or peritoneal signs  Genitourinary:    Comments: Soften stool present in the rectal vault, no blood noted Musculoskeletal:        General: No deformity.      Cervical back: Neck supple.  Skin:    General: Skin is warm and dry.     Capillary Refill: Capillary refill takes less than 2 seconds.  Neurological:     Mental Status: She is alert.     Coordination: Coordination normal.     Comments: Speech is clear, able to follow commands Moves extremities without ataxia, coordination intact  Psychiatric:        Mood and Affect: Mood normal.        Behavior: Behavior normal.     ED Results / Procedures / Treatments   Labs (all labs ordered are listed, but only abnormal results are displayed) Labs Reviewed  CBC WITH DIFFERENTIAL/PLATELET - Abnormal; Notable for the following components:      Result Value   RBC 3.51 (*)    Hemoglobin 6.7 (*)    HCT 24.1 (*)    MCV 68.7 (*)    MCH 19.1 (*)    MCHC 27.8 (*)    RDW 17.1 (*)    All other components within normal limits  BASIC METABOLIC PANEL - Abnormal; Notable for the following components:   Glucose, Bld 108 (*)    GFR calc non Af Amer 57 (*)    All other components within normal limits  PROTIME-INR - Abnormal; Notable for the following components:   Prothrombin Time 16.1 (*)    INR 1.3 (*)    All other components within normal limits  VITAMIN B12 - Abnormal; Notable for the following components:   Vitamin B-12 73 (*)    All other components within normal limits  IRON AND TIBC - Abnormal; Notable for the following components:   Iron 14 (*)    TIBC 506 (*)    Saturation Ratios 3 (*)    All other components within normal limits  FERRITIN - Abnormal; Notable for the  following components:   Ferritin 3 (*)    All other components within normal limits  RETICULOCYTES - Abnormal; Notable for the following components:   RBC. 3.44 (*)    Immature Retic Fract 33.0 (*)    All other components within normal limits  GLUCOSE, CAPILLARY - Abnormal; Notable for the following components:   Glucose-Capillary 108 (*)    All other components within normal limits  SARS CORONAVIRUS 2 BY RT PCR  (HOSPITAL ORDER, [facility])  FOLATE  COMPREHENSIVE METABOLIC PANEL  CBC  HEMOGLOBIN A1C  POC OCCULT BLOOD, ED  TYPE AND SCREEN  ABO/RH  PREPARE RBC (CROSSMATCH)    EKG None  Radiology No results found.  Procedures Procedures (including critical care time)  Medications Ordered in ED Medications - No data to display  ED Course  I have reviewed the triage vital signs and the nursing notes.  Pertinent labs & imaging results that were available during my care of the patient were reviewed by me and considered in my medical decision making (see chart for details).    MDM Rules/Calculators/A&P                       79 year old female sent from PCPs office for low hemoglobin, no focal bleeding symptoms, is currently on Eliquis.  I have independently ordered, reviewed and interpreted all labs and imaging: CBC: Hemoglobin 6.7, will get anemia panel, no leukocytosis BMP: Glucose 108, no other significant electrolyte derangements, normal renal function Hemoccult: Positive  Patient Hemoccult positive currently on anticoagulation with hemoglobin of 6.7, symptomatic anemia.  Will order 1 unit blood transfusion and consult for admission  Case discussed with Dr. Waldron Labs with [facility] hospitalist who will see and admit the patient.  Request GI consult as patient will need evaluation before she can go back on Eliquis.  Case discussed with Dr. Gala Romney with GI who will plan to see the patient in consult.  States they will have to wait a few days before they can do scope for evaluation since the patient is on Eliquis, at this time patient can eat and drink. Once they evaluate her they will determine when to do colonoscopy and/or endoscopy.  Final Clinical Impression(s) / ED Diagnoses Final diagnoses:  Symptomatic anemia  Occult blood in stools    Rx / DC Orders ED Discharge Orders    None       Janet Berlin 12/06/19 2357    Margette Fast,  MD 12/09/19 [phone]

## 2019-12-06 NOTE — Care Management Obs Status (Signed)
[place] NOTIFICATION   Patient Details  Name: [name] [name] MRN: [id number] Date of Birth: March 24, 1941   Medicare Observation Status Notification Given:  Yes    Sherie Don, LCSW 12/06/2019, 8:49 PM

## 2019-12-06 NOTE — Telephone Encounter (Addendum)
Hemoglobin 11.1 - 15.9 g/dL 6.9Low Panic    Pt was called earlier this morning with lab results- we were attempting to get patient a blood transfusion as out-patient today.  Per referral coordinator  - patient would have to be Type and cross-matched today and then would not receive blood until Monday.   I have discussed this with B Blanch Media, PCP and we have decided that it would be in patients best interest to go to the ER today rather than waiting until Monday.   Level is at 6.9 currently, we are unsure of where the blood loss is coming from, she  Is also on ELIQUIS . Pt was symptomatic at [place] - she has SOB   Pt is willing to go to ER and states that she will have a ride there is just a little bit - plans to go to [place].  She had an episode similar to this back in 1989, when she had extremely heavy menses. She later had hysterectomy and did not have any issues with hemoglobin until SEPT 2020 - when it had dropped to 9.0  She is on ELIQUIS and has not ever followed CARDIO - she seen DR Courage in Hospital - no follow up OV since.  Her sister had colon cancer and was made to have a colonoscopy in 2017  - performed by Dr Wilfrid Lund III at [facility]. - has not followed up since.   I scheduled a follow up with B. Blanch Media ( PCP) for next [facility] 12/11/19 at 1005 for follow up on labs and future needs for pt.

## 2019-12-06 NOTE — ED Notes (Signed)
Per pt  She was seen at [facility] on Tuesday   She looked at her My chart and found abnormal labs  She called [facility] and was told they "had not had time to look at it"  Called today with suggestion to come to ED for eval

## 2019-12-06 NOTE — ED Notes (Signed)
Date and time results received: 12/06/19 12/06/2019 1439   Test: hemoglobin  Critical Value: 6.7  Name of Provider Notified: Benedetto Goad PA  Orders Received? Or Actions Taken?: see chart

## 2019-12-06 NOTE — Consult Note (Signed)
Referring Provider: EDP Primary Care Physician:  Loman Brooklyn, FNP Primary Gastroenterologist:  Dr. Loletha Carrow ([facility])  Date of Admission: 12/06/19 Date of Consultation: 12/06/19  Reason for Consultation:  GI bleed  HPI:  [name] [name] is a 79 y.o. female with a past medical history of arthritis, diabetes, breast cancer, DVT, pulmonary embolism, hypertension, hypothyroidism.  The patient presented on referral from [place] [facility] for anemia.  The patient was seen by primary care 12/03/2019 for routine follow-up of chronic conditions.  Apparent history of anemia.  CBC was ordered and came back with hemoglobin of 6.9.  The patient was referred to ED.  She is on Eliquis.  She was symptomatic at office visit with shortness of breath and fatigue.  Sister with known history of colon cancer. She has been on Eliquis since 03/2019 for moderate PE.  In the emergency department her hemoglobin was confirmed at 6.7.  BMP essentially normal.  INR mildly elevated at 1.3 but near baseline.  Type and screen order has been entered.  Vitals are currently stable with most recent blood pressure 153/45 and heart rate 60.  Flexible sigmoidoscopy completed 01/14/2016 by Dr. Wilfrid Lund which found a single 8 mm polyp and sigmoid diverticulosis.  Surgical pathology found the polyp to be tubular adenoma.  Recommended complete CT scan colonoscopy or barium enema.  CT virtual colonoscopy completed 01/25/2016 which found no significant colonic polypoid lesion, mass, stricture.  Unremarkable unenhanced CT of the abdomen and pelvis.  Today she states she's doing ok overall. Denies any obvious hematochezia or melena. Did have a PE in 03/2019 for which she started Eliquis. Last dose of Eliquis was this morning at 0800. Denies abdominal pain, N/V. States her last colonoscopy was a flex sig "because I have a kink in my intestines and they couldn't finish it, which is why they did the virtual one." She was told she wouldn't  need another (screening) colonoscopy; That was her first one. No previous EGD. Denies any NSAIDs. She did have some progressive dyspnea, weakness, fatigue, near syncope recently. No chest pain. Denies any other overt GI complaints.  Past Medical History:  Diagnosis Date  . Arthritis    Ostearthritis- hips, knees, fingers  . Breast cancer ([place]) 1997   pt denies  . DVT (deep venous thrombosis) ([place])   . Headache(784.0)    tx. Valproic acid  . Hypertension   . Hypothyroidism   . Pulmonary embolism [facility])     Past Surgical History:  Procedure Laterality Date  . ABDOMINAL HYSTERECTOMY    . CATARACT EXTRACTION, BILATERAL    . JOINT REPLACEMENT Left    [id number]  . MULTIPLE TOOTH EXTRACTIONS     60's  . PARATHYROIDECTOMY    . TOTAL HIP ARTHROPLASTY Right 10/29/2013   Procedure: RIGHT TOTAL HIP ARTHROPLASTY ANTERIOR APPROACH;  Surgeon: Mauri Pole, MD;  Location: [facility] ORS;  Service: Orthopedics;  Laterality: Right;    Prior to Admission medications   Medication Sig Start Date End Date Taking? Authorizing Provider  acetaminophen (TYLENOL) 325 MG tablet Take 2 tablets (650 mg total) by mouth every 6 (six) hours as needed for mild pain or headache (or Fever >/= 101). 04/04/19  Yes Emokpae, Courage, MD  allopurinol (ZYLOPRIM) 100 MG tablet Take 1 tablet (100 mg total) by mouth daily. 12/03/19  Yes [name] F, FNP  amLODipine (NORVASC) 5 MG tablet Take 1 tablet (5 mg total) by mouth daily. 12/03/19  Yes Loman Brooklyn, FNP  apixaban Arne [place]) 5  MG TABS tablet Take 1 tablet (5 mg total) by mouth 2 (two) times daily. 11/19/19  Yes [name] F, FNP  Cholecalciferol (VITAMIN D-3) 5000 UNITS TABS Take 5,000 tablets by mouth daily.   Yes [provider]  diclofenac sodium (VOLTAREN) 1 % GEL Apply 4 g topically 4 (four) times daily. 04/19/19  Yes Terald Sleeper, PA-C  furosemide (LASIX) 20 MG tablet Take 1 tablet (20 mg total) by mouth 2 (two) times daily. 12/03/19  Yes [name] F,  FNP  levothyroxine (SYNTHROID) 25 MCG tablet Take 1 tablet (25 mcg total) by mouth daily before breakfast. 12/03/19  Yes Loman Brooklyn, FNP  loratadine (CLARITIN) 10 MG tablet Take 10 mg by mouth daily.   Yes [provider]  losartan (COZAAR) 100 MG tablet Take 1 tablet (100 mg total) by mouth every morning. 12/03/19  Yes [name] F, FNP  metFORMIN (GLUCOPHAGE) 500 MG tablet Take 1 tablet (500 mg total) by mouth 2 (two) times daily with a meal. Give w/food. 12/03/19  Yes [name] F, FNP  ONE TOUCH ULTRA TEST test strip USE UP TO FOUR TIMES DAILY AS DIRECTED 10/04/17  Yes Terald Sleeper, PA-C  oxybutynin (DITROPAN) 5 MG tablet Take 1 tablet (5 mg total) by mouth 3 (three) times daily. Needs to be seen for further refills 07/08/19  Yes Terald Sleeper, PA-C  pantoprazole (PROTONIX) 40 MG tablet Take 1 tablet (40 mg total) by mouth daily. 12/03/19  Yes [name] F, FNP  simvastatin (ZOCOR) 40 MG tablet Take 1 tablet (40 mg total) by mouth daily at 6 PM. Needs to be seen for further refills. 12/03/19  Yes Loman Brooklyn, FNP    Current Facility-Administered Medications  Medication Dose Route Frequency Provider Last Rate Last Admin  . 0.9 %  sodium chloride infusion  10 mL/hr Intravenous Once Jacqlyn Larsen, [place]       Current Outpatient Medications  Medication Sig Dispense Refill  . acetaminophen (TYLENOL) 325 MG tablet Take 2 tablets (650 mg total) by mouth every 6 (six) hours as needed for mild pain or headache (or Fever >/= 101). 12 tablet 0  . allopurinol (ZYLOPRIM) 100 MG tablet Take 1 tablet (100 mg total) by mouth daily. 90 tablet 1  . amLODipine (NORVASC) 5 MG tablet Take 1 tablet (5 mg total) by mouth daily. 90 tablet 1  . apixaban (ELIQUIS) 5 MG TABS tablet Take 1 tablet (5 mg total) by mouth 2 (two) times daily. 60 tablet 5  . Cholecalciferol (VITAMIN D-3) 5000 UNITS TABS Take 5,000 tablets by mouth daily.    . diclofenac sodium (VOLTAREN) 1 % GEL Apply 4 g  topically 4 (four) times daily. 200 g 11  . furosemide (LASIX) 20 MG tablet Take 1 tablet (20 mg total) by mouth 2 (two) times daily. 180 tablet 1  . levothyroxine (SYNTHROID) 25 MCG tablet Take 1 tablet (25 mcg total) by mouth daily before breakfast. 90 tablet 1  . loratadine (CLARITIN) 10 MG tablet Take 10 mg by mouth daily.    [name] 100 MG tablet Take 1 tablet (100 mg total) by mouth every morning. 90 tablet 1  . metFORMIN (GLUCOPHAGE) 500 MG tablet Take 1 tablet (500 mg total) by mouth 2 (two) times daily with a meal. Give w/food. 180 tablet 1  . ONE TOUCH ULTRA TEST test strip USE UP TO FOUR TIMES DAILY AS DIRECTED 400 each 3  . oxybutynin (DITROPAN) 5 MG tablet  Take 1 tablet (5 mg total) by mouth 3 (three) times daily. Needs to be seen for further refills 270 tablet 0  . pantoprazole (PROTONIX) 40 MG tablet Take 1 tablet (40 mg total) by mouth daily. 90 tablet 1  . simvastatin (ZOCOR) 40 MG tablet Take 1 tablet (40 mg total) by mouth daily at 6 PM. Needs to be seen for further refills. 90 tablet 1    Allergies as of 12/06/2019  . (No Known Allergies)    Family History  Problem Relation Age of Onset  . Colitis Sister 75       alive  . Cancer Sister 1       colon  . Cancer Mother 68       uterine  . Heart disease Father 65       heart failure  . Heart attack Brother   . Healthy Daughter   . Healthy Son   . Pulmonary embolism Brother   . Heart disease Brother   . Healthy Son   . Healthy Son     Social History   Socioeconomic History  . Marital status: Widowed    Spouse name: Not on file  . Number of children: 4  . Years of education: 41  . Highest education level: High school graduate  Occupational History  . 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 Smoking status: Never Smoker  . Smokeless tobacco: Never Used  Substance and Sexual Activity  . Alcohol use: No    Alcohol/week: 0.0 standard drinks  . Drug use: No  . Sexual  activity: Not Currently  Other Topics Concern  . Not on file  Social History Narrative   Patient is widowed and lives in a one story home. She has four adult children and one son lives with her.    Social Determinants of Health   Financial Resource Strain: Low Risk   . Difficulty of Paying Living Expenses: Not hard at all  Food Insecurity: No Food Insecurity  . Worried About Charity fundraiser in the Last Year: Never true  . Ran Out of Food in the Last Year: Never true  Transportation Needs: No Transportation Needs  . Lack of Transportation (Medical): No  . Lack of Transportation (Non-Medical): No  Physical Activity: Sufficiently Active  . Days of Exercise per Week: 7 days  . Minutes of Exercise per Session: 30 min  Stress: No Stress Concern Present  . Feeling of Stress : Not at all  Social Connections: Not Isolated  . Frequency of Communication with Friends and Family: More than three times a week  . Frequency of Social Gatherings with Friends and Family: More than three times a week  . Attends Religious Services: More than 4 times per year  . Active Member of Clubs or Organizations: Yes  . Attends Archivist Meetings: More than 4 times per year  . Marital Status: Married  Human resources officer Violence: Not At Risk  . Fear of Current or Ex-Partner: No  . Emotionally Abused: No  . Physically Abused: No  . Sexually Abused: No    Review of Systems: General: Negative for anorexia, weight loss, fever, chills, fatigue, weakness. ENT: Negative for hoarseness, difficulty swallowing. CV: Negative for chest pain, angina, palpitations, peripheral edema.  Respiratory: Negative for dyspnea at rest, cough, sputum, wheezing. Admits DOE. GI: See history of present illness. GU:  Negative for dysuria, hematuria, urinary incontinence, urinary frequency, nocturnal urination.  MS: Negative  for joint pain, low back pain.  Endo: Negative for unusual weight change.  Heme: Negative for  bruising or bleeding. Allergy: Negative for rash or hives.  Physical Exam: Vital signs in last 24 hours: Temp:  [98.2 F (36.8 C)] 98.2 F (36.8 C) (05/14 1308) Pulse Rate:  [56-69] 60 (05/14 1530) Resp:  [19-21] 21 (05/14 1500) BP: (135-153)/(45-78) 153/45 (05/14 1530) SpO2:  [93 %-100 %] 93 % (05/14 1530)   General:   Alert,  Well-developed, well-nourished, pleasant and cooperative in NAD Head:  Normocephalic and atraumatic. Eyes:  Sclera clear, no icterus. Conjunctiva pink. Ears:  Normal auditory acuity. Neck:  Supple; no masses or thyromegaly. Lungs:  Clear throughout to auscultation.  No wheezes, crackles, or rhonchi. No acute distress. Heart:  Regular rate and rhythm; no murmurs, clicks, rubs,  or gallops. Abdomen:  Soft, nontender and nondistended. No masses, hepatosplenomegaly or hernias noted. Normal bowel sounds, without guarding, and without rebound.   Rectal:  Deferred.   Msk:  Symmetrical without gross deformities. Pulses:  Normal bilateral DP pulses noted. Extremities:  Without clubbing or edema. Neurologic:  Alert and  oriented x4;  grossly normal neurologically. Skin:  Intact without significant lesions or rashes. Cervical Nodes:  No significant cervical adenopathy. Psych:  Alert and cooperative. Normal mood and affect.  Intake/Output from previous day: No intake/output data recorded. Intake/Output this shift: No intake/output data recorded.  Lab Results: Recent Labs    12/06/19 1414  WBC 7.0  HGB 6.7*  HCT 24.1*  PLT 350   BMET Recent Labs    12/06/19 1414  NA 137  K 3.5  CL 105  CO2 24  GLUCOSE 108*  BUN 21  CREATININE 0.96  CALCIUM 9.9   LFT No results for input(s): PROT, ALBUMIN, AST, ALT, ALKPHOS, BILITOT, BILIDIR, IBILI in the last 72 hours. PT/INR Recent Labs    12/06/19 1414  LABPROT 16.1*  INR 1.3*   Hepatitis Panel No results for input(s): HEPBSAG, HCVAB, HEPAIGM, HEPBIGM in the last 72 hours. C-Diff No results for  input(s): CDIFFTOX in the last 72 hours.  Studies/Results: No results found.  Impression: Very pleasant 79 year old female presents with a history of PE on Eliquis since 03/2019 (last dose 8:00 am this morning) with significant, symptomatic anemia (hgb 6.7). Vitals are stable. First/last/only colonoscopic exam was a flex sig in 2017 at [facility] with a single tubular adenoma polyp; follow-up virtual colonoscopy normal. No further exams recommended.  Anemia- Went to PCP for dyspnea and routine follow-up a few days ago. CBC found hgb 6.9, was referred to the ER. Recheck here 6.7. Iron low (14), sats low (3), Ferritin low (3) overall consistent with IDA possibly due to chronic GI losses. Denies any obvious bleeding, abdominal pain, other GI complaints. Multiple possible differentials including (but not limited to) bleeding polyp, benign anorectal source, gastritis, duodenitis, esophagitis, gastric erosions, duodenal erosions, PUD, less likely carcinogenic process. Will will likely require EGD/colonoscopy to evaluate after Eliquis held for 48 hours.  Plan: 1. Hold Eliquis for now 2. Monitor hgb closely 3. Transfuse as necessary 4. Notify GI of any obvious GI bleed to possibly narrow down the source 5. Likely EGD/colonoscopy (as early as Sunday late morning/afternoon) 6. Supportive measures   Thank you for allowing [place] to participate in the care of [name] Coy, DNP, AGNP-C Adult & Gerontological Nurse Practitioner [facility]    LOS: 0 days     12/06/2019, 4:14 PM

## 2019-12-06 NOTE — H&P (Signed)
TRH H&P   Patient Demographics:    [name] [name], is a 79 y.o. female  MRN: [id number]   DOB - 1941/05/18  Admit Date - 12/06/2019  Outpatient Primary MD for the patient is Loman Brooklyn, FNP  Referring MD/NP/PA: PA Ford  Patient coming from: Home  Chief Complaint  Patient presents with  . Abnormal Lab      HPI:    [name] [name]  is a 79 y.o. female, with past medical history of hypothyroidism, hypertension, breast cancer, arthritis, patient diagnosis of PE 03/2020, started on Eliquis, and was sent by her PCP for anemia, patient saw her PCP 5/11, complaining of generalized weakness and fatigue, she had normal labs done which did show a hemoglobin of 6.9, so she was referred to ED for further evaluation, she was started on Eliquis last September for new diagnosis of unprovoked PE, patient reports some generalized weakness, fatigue, she denies any chest pain, or shortness of breath, no coffee-ground emesis, no melena or bright red blood per rectum, most recent colonoscopy 2017 significant for diverticulosis and small polyp which were removed. -In ED repeat hemoglobin was 6.7, she was ordered 1 unit PRBC, was Hemoccult positive, GI were consulted.  Hospitalist consulted to admit.    Review of systems:    In addition to the HPI above, No Fever-chills, reports generalized weakness and fatigue No Headache, No changes with Vision or hearing, No problems swallowing food or Liquids, No Chest pain, Cough or Shortness of Breath, No Abdominal pain, No Nausea or Vommitting, Bowel movements are regular, No Blood in stool or Urine, No dysuria, No new skin rashes or bruises, No new joints pains-aches,  No new weakness, tingling, numbness in any extremity, No recent weight gain or loss, No polyuria, polydypsia or polyphagia, No significant Mental Stressors.  A full 10 point Review of  Systems was done, except as stated above, all other Review of Systems were negative.   With Past History of the following :    Past Medical History:  Diagnosis Date  . Arthritis    Ostearthritis- hips, knees, fingers  . Breast cancer ([place]) 1997   pt denies  . DVT (deep venous thrombosis) ([place])   . Headache(784.0)    tx. Valproic acid  . Hypertension   . Hypothyroidism   . Pulmonary embolism [facility])       Past Surgical History:  Procedure Laterality Date  . ABDOMINAL HYSTERECTOMY    . CATARACT EXTRACTION, BILATERAL    . JOINT REPLACEMENT Left    [id number]  . MULTIPLE TOOTH EXTRACTIONS     60's  . PARATHYROIDECTOMY    . TOTAL HIP ARTHROPLASTY Right 10/29/2013   Procedure: RIGHT TOTAL HIP ARTHROPLASTY ANTERIOR APPROACH;  Surgeon: Mauri Pole, MD;  Location: [facility] ORS;  Service: Orthopedics;  Laterality: Right;      Social History:     Social History  Tobacco Use  . Smoking status: Never Smoker  . Smokeless tobacco: Never Used  Substance Use Topics  . Alcohol use: No    Alcohol/week: 0.0 standard drinks        Family History :     Family History  Problem Relation Age of Onset  . Colitis Sister 17       alive  . Cancer Sister 3       colon  . Cancer Mother 18       uterine  . Heart disease Father 67       heart failure  . Heart attack Brother   . Healthy Daughter   . Healthy Son   . Pulmonary embolism Brother   . Heart disease Brother   . Healthy Son   . Healthy Son      Home Medications:   Prior to Admission medications   Medication Sig Start Date End Date Taking? Authorizing Provider  acetaminophen (TYLENOL) 325 MG tablet Take 2 tablets (650 mg total) by mouth every 6 (six) hours as needed for mild pain or headache (or Fever >/= 101). 04/04/19  Yes Emokpae, Courage, MD  allopurinol (ZYLOPRIM) 100 MG tablet Take 1 tablet (100 mg total) by mouth daily. 12/03/19  Yes [name] F, FNP  amLODipine (NORVASC) 5 MG tablet Take 1 tablet (5 mg total) by  mouth daily. 12/03/19  Yes Loman Brooklyn, FNP  apixaban (ELIQUIS) 5 MG TABS tablet Take 1 tablet (5 mg total) by mouth 2 (two) times daily. 11/19/19  Yes [name] F, FNP  Cholecalciferol (VITAMIN D-3) 5000 UNITS TABS Take 5,000 tablets by mouth daily.   Yes [provider]  diclofenac sodium (VOLTAREN) 1 % GEL Apply 4 g topically 4 (four) times daily. 04/19/19  Yes Terald Sleeper, PA-C  furosemide (LASIX) 20 MG tablet Take 1 tablet (20 mg total) by mouth 2 (two) times daily. 12/03/19  Yes [name] F, FNP  levothyroxine (SYNTHROID) 25 MCG tablet Take 1 tablet (25 mcg total) by mouth daily before breakfast. 12/03/19  Yes Loman Brooklyn, FNP  loratadine (CLARITIN) 10 MG tablet Take 10 mg by mouth daily.   Yes [provider]  losartan (COZAAR) 100 MG tablet Take 1 tablet (100 mg total) by mouth every morning. 12/03/19  Yes [name] F, FNP  metFORMIN (GLUCOPHAGE) 500 MG tablet Take 1 tablet (500 mg total) by mouth 2 (two) times daily with a meal. Give w/food. 12/03/19  Yes [name] F, FNP  ONE TOUCH ULTRA TEST test strip USE UP TO FOUR TIMES DAILY AS DIRECTED 10/04/17  Yes Terald Sleeper, PA-C  oxybutynin (DITROPAN) 5 MG tablet Take 1 tablet (5 mg total) by mouth 3 (three) times daily. Needs to be seen for further refills 07/08/19  Yes Terald Sleeper, PA-C  pantoprazole (PROTONIX) 40 MG tablet Take 1 tablet (40 mg total) by mouth daily. 12/03/19  Yes [name] F, FNP  simvastatin (ZOCOR) 40 MG tablet Take 1 tablet (40 mg total) by mouth daily at 6 PM. Needs to be seen for further refills. 12/03/19  Yes [name] F, FNP     Allergies:    No Known Allergies   Physical Exam:   Vitals  Blood pressure (!) 156/75, pulse (!) 59, temperature 98.2 F (36.8 C), resp. rate (!) 21, SpO2 97 %.   1. General , developed female lyng in bed in NAD,   2. Normal affect and insight, Not Suicidal or Homicidal, Awake  Alert, Oriented X 3.  3. No F.N deficits, ALL  C.Nerves Intact, Strength 5/5 all 4 extremities, Sensation intact all 4 extremities, Plantars down going.  4. Ears and Eyes appear Normal, Conjunctivae clear, PERRLA. Moist Oral Mucosa.  5. Supple Neck, No JVD, No cervical lymphadenopathy appriciated, No Carotid Bruits.  6. Symmetrical Chest wall movement, Good air movement bilaterally, CTAB.  7. RRR, No Gallops, Rubs or Murmurs, No Parasternal Heave.  8. Positive Bowel Sounds, Abdomen Soft, No tenderness, No organomegaly appriciated,No rebound -guarding or rigidity.  9.  No Cyanosis, Normal Skin Turgor, No Skin Rash or Bruise.  10. Good muscle tone,  joints appear normal , no effusions, Normal ROM.  11. No Palpable Lymph Nodes in Neck or Axillae    Data Review:    CBC Recent Labs  Lab 12/03/19 0758 12/06/19 1414  WBC 6.0 7.0  HGB 6.9* 6.7*  HCT 25.3* 24.1*  PLT 387 350  MCV 71* 68.7*  MCH 19.3* 19.1*  MCHC 27.3* 27.8*  RDW 15.5* 17.1*  LYMPHSABS 2.0 2.1  MONOABS  --  0.9  EOSABS 0.1 0.1  BASOSABS 0.0 0.0   ------------------------------------------------------------------------------------------------------------------  Chemistries  Recent Labs  Lab 12/03/19 0758 12/06/19 1414  NA 141 137  K 4.6 3.5  CL 106 105  CO2 21 24  GLUCOSE 106* 108*  BUN 20 21  CREATININE 0.95 0.96  CALCIUM 11.1* 9.9  AST 15  --   ALT 12  --   ALKPHOS 73  --   BILITOT 0.3  --    ------------------------------------------------------------------------------------------------------------------ estimated creatinine clearance is 53.6 mL/min (by C-G formula based on SCr of 0.96 mg/dL). ------------------------------------------------------------------------------------------------------------------ No results for input(s): TSH, T4TOTAL, T3FREE, THYROIDAB in the last 72 hours.  Invalid input(s): FREET3  Coagulation profile Recent Labs  Lab 12/06/19 1414  INR 1.3*    ------------------------------------------------------------------------------------------------------------------- No results for input(s): DDIMER in the last 72 hours. -------------------------------------------------------------------------------------------------------------------  Cardiac Enzymes No results for input(s): CKMB, TROPONINI, MYOGLOBIN in the last 168 hours.  Invalid input(s): CK ------------------------------------------------------------------------------------------------------------------ No results found for: BNP   ---------------------------------------------------------------------------------------------------------------  Urinalysis    Component Value Date/Time   COLORURINE YELLOW 10/22/2013 1127   APPEARANCEUR CLOUDY (A) 10/22/2013 1127   LABSPEC 1.016 10/22/2013 1127   PHURINE 5.0 10/22/2013 1127   GLUCOSEU NEGATIVE 10/22/2013 1127   HGBUR NEGATIVE 10/22/2013 1127   BILIRUBINUR NEGATIVE 10/22/2013 1127   KETONESUR NEGATIVE 10/22/2013 1127   PROTEINUR NEGATIVE 10/22/2013 1127   UROBILINOGEN 0.2 10/22/2013 1127   NITRITE NEGATIVE 10/22/2013 1127   LEUKOCYTESUR SMALL (A) 10/22/2013 1127    ----------------------------------------------------------------------------------------------------------------   Imaging Results:    No results found.    Assessment & Plan:    Active Problems:   Symptomatic anemia   Type 2 diabetes mellitus without complication, without long-term current use of insulin (HCC)   Essential hypertension   Hypothyroidism   Hyperlipidemia associated with type 2 diabetes mellitus (HCC)   Gastroesophageal reflux disease without esophagitis   Chronic gout without tophus   Pulmonary embolism (HCC)    Symptomatic anemia/chronic blood loss anemia/GI bleed -Hemoglobin  6.7 on admission, she will be transfused 1 unit PRBC, will keep hemoglobin> 7, will hold Eliquis for now, GI has been consulted, keep on clear liquid diet, and  continue with PPI, continue with gentle hydration. -Patient was started on Eliquis last September, she is Hemoccult positive, but she has normal colored stool.  History of pulmonary embolism/right lower extremity DVT -Diagnosed in September 2020, appears to be unprovoked, no clear etiology,  she was started on Eliquis, for now continue to hold Eliquis pending further recommendation per GI.  This mellitus -Hold oral regimen and continue with insulin sliding scale.  Diabetes mellitus -Hold Metformin and continue with sliding scale during hospital stay  History of gout -Continue with home medications  Hypothyroidism -Continue with Synthroid  DVT Prophylaxis   Lovenox - SCDs   AM Labs Ordered, also please review Full Orders  Family Communication: Admission, patients condition and plan of care including tests being ordered have been discussed with the patient and Daughter who indicate understanding and agree with the plan and Code Status.  Code Status Full  Likely DC to  Home  Condition GUARDED    Consults called: GI  Admission status: Observation  Time spent in minutes : 50 minutes   Phillips Climes M.D on 12/06/2019 at 5:42 PM  Between 7am to 7pm - Pager - [phone]. After 7pm go to [email or web address] - password [facility]  [facility] - Office  [phone]

## 2019-12-06 NOTE — ED Notes (Signed)
Report to Shannon, RN, CN 

## 2019-12-06 NOTE — ED Triage Notes (Signed)
States she was advised to come to to the ED for a possible blood transfusion, states she feels lightheaded for the past 2 weeks

## 2019-12-06 NOTE — ED Notes (Signed)
Sent from [facility] for possible blood transfusion   Reports light headed for the last 2 weeks  Here for eval

## 2019-12-06 NOTE — ED Notes (Signed)
covid swab to lab.

## 2019-12-07 DIAGNOSIS — Z86718 Personal history of other venous thrombosis and embolism: Secondary | ICD-10-CM | POA: Diagnosis not present

## 2019-12-07 DIAGNOSIS — E785 Hyperlipidemia, unspecified: Secondary | ICD-10-CM

## 2019-12-07 DIAGNOSIS — D500 Iron deficiency anemia secondary to blood loss (chronic): Secondary | ICD-10-CM | POA: Diagnosis not present

## 2019-12-07 DIAGNOSIS — E890 Postprocedural hypothyroidism: Secondary | ICD-10-CM | POA: Diagnosis present

## 2019-12-07 DIAGNOSIS — K219 Gastro-esophageal reflux disease without esophagitis: Secondary | ICD-10-CM

## 2019-12-07 DIAGNOSIS — Z79890 Hormone replacement therapy (postmenopausal): Secondary | ICD-10-CM | POA: Diagnosis not present

## 2019-12-07 DIAGNOSIS — K5730 Diverticulosis of large intestine without perforation or abscess without bleeding: Secondary | ICD-10-CM | POA: Diagnosis present

## 2019-12-07 DIAGNOSIS — D179 Benign lipomatous neoplasm, unspecified: Secondary | ICD-10-CM | POA: Diagnosis present

## 2019-12-07 DIAGNOSIS — I10 Essential (primary) hypertension: Secondary | ICD-10-CM | POA: Diagnosis not present

## 2019-12-07 DIAGNOSIS — Z8249 Family history of ischemic heart disease and other diseases of the circulatory system: Secondary | ICD-10-CM | POA: Diagnosis not present

## 2019-12-07 DIAGNOSIS — M1990 Unspecified osteoarthritis, unspecified site: Secondary | ICD-10-CM | POA: Diagnosis present

## 2019-12-07 DIAGNOSIS — R195 Other fecal abnormalities: Secondary | ICD-10-CM

## 2019-12-07 DIAGNOSIS — M1A9XX0 Chronic gout, unspecified, without tophus (tophi): Secondary | ICD-10-CM | POA: Diagnosis not present

## 2019-12-07 DIAGNOSIS — D62 Acute posthemorrhagic anemia: Secondary | ICD-10-CM | POA: Diagnosis present

## 2019-12-07 DIAGNOSIS — E1169 Type 2 diabetes mellitus with other specified complication: Secondary | ICD-10-CM

## 2019-12-07 DIAGNOSIS — Q438 Other specified congenital malformations of intestine: Secondary | ICD-10-CM | POA: Diagnosis not present

## 2019-12-07 DIAGNOSIS — Z96641 Presence of right artificial hip joint: Secondary | ICD-10-CM | POA: Diagnosis present

## 2019-12-07 DIAGNOSIS — E039 Hypothyroidism, unspecified: Secondary | ICD-10-CM | POA: Diagnosis not present

## 2019-12-07 DIAGNOSIS — K635 Polyp of colon: Secondary | ICD-10-CM | POA: Diagnosis not present

## 2019-12-07 DIAGNOSIS — Z7901 Long term (current) use of anticoagulants: Secondary | ICD-10-CM | POA: Diagnosis not present

## 2019-12-07 DIAGNOSIS — K3189 Other diseases of stomach and duodenum: Secondary | ICD-10-CM | POA: Diagnosis not present

## 2019-12-07 DIAGNOSIS — D649 Anemia, unspecified: Secondary | ICD-10-CM | POA: Diagnosis not present

## 2019-12-07 DIAGNOSIS — K922 Gastrointestinal hemorrhage, unspecified: Secondary | ICD-10-CM | POA: Diagnosis present

## 2019-12-07 DIAGNOSIS — Z20822 Contact with and (suspected) exposure to covid-19: Secondary | ICD-10-CM | POA: Diagnosis present

## 2019-12-07 DIAGNOSIS — E119 Type 2 diabetes mellitus without complications: Secondary | ICD-10-CM

## 2019-12-07 DIAGNOSIS — I2699 Other pulmonary embolism without acute cor pulmonale: Secondary | ICD-10-CM

## 2019-12-07 DIAGNOSIS — Z7984 Long term (current) use of oral hypoglycemic drugs: Secondary | ICD-10-CM | POA: Diagnosis not present

## 2019-12-07 DIAGNOSIS — Z86711 Personal history of pulmonary embolism: Secondary | ICD-10-CM | POA: Diagnosis not present

## 2019-12-07 DIAGNOSIS — Z79899 Other long term (current) drug therapy: Secondary | ICD-10-CM | POA: Diagnosis not present

## 2019-12-07 DIAGNOSIS — D124 Benign neoplasm of descending colon: Secondary | ICD-10-CM | POA: Diagnosis not present

## 2019-12-07 LAB — COMPREHENSIVE METABOLIC PANEL
ALT: 16 U/L (ref 0–44)
AST: 15 U/L (ref 15–41)
Albumin: 3.6 g/dL (ref 3.5–5.0)
Alkaline Phosphatase: 60 U/L (ref 38–126)
Anion gap: 7 (ref 5–15)
BUN: 14 mg/dL (ref 8–23)
CO2: 26 mmol/L (ref 22–32)
Calcium: 9.9 mg/dL (ref 8.9–10.3)
Chloride: 108 mmol/L (ref 98–111)
Creatinine, Ser: 0.68 mg/dL (ref 0.44–1.00)
GFR calc Af Amer: >60 mL/min (ref >60)
GFR calc non Af Amer: >60 mL/min (ref >60)
Glucose, Bld: 106 mg/dL — ABNORMAL HIGH (ref 70–99)
Potassium: 3.7 mmol/L (ref 3.5–5.1)
Sodium: 141 mmol/L (ref 135–145)
Total Bilirubin: 0.7 mg/dL (ref 0.3–1.2)
Total Protein: 6.1 g/dL — ABNORMAL LOW (ref 6.5–8.1)

## 2019-12-07 LAB — GLUCOSE, CAPILLARY
Glucose-Capillary: 105 mg/dL — ABNORMAL HIGH (ref 70–99)
Glucose-Capillary: 94 mg/dL (ref 70–99)
Glucose-Capillary: 91 mg/dL (ref 70–99)

## 2019-12-07 LAB — CBC
HCT: 25.9 % — ABNORMAL LOW (ref 36.0–46.0)
Hemoglobin: 7.1 g/dL — ABNORMAL LOW (ref 12.0–15.0)
MCH: 19.7 pg — ABNORMAL LOW (ref 26.0–34.0)
MCHC: 27.4 g/dL — ABNORMAL LOW (ref 30.0–36.0)
MCV: 71.7 fL — ABNORMAL LOW (ref 80.0–100.0)
Platelets: 304 10*3/uL (ref 150–400)
RBC: 3.61 MIL/uL — ABNORMAL LOW (ref 3.87–5.11)
RDW: 19.4 % — ABNORMAL HIGH (ref 11.5–15.5)
WBC: 5.5 10*3/uL (ref 4.0–10.5)
nRBC: 0 % (ref 0.0–0.2)

## 2019-12-07 LAB — HEMOGLOBIN A1C
Hgb A1c MFr Bld: 6.1 % — ABNORMAL HIGH (ref 4.8–5.6)
Mean Plasma Glucose: 128.37 mg/dL

## 2019-12-07 LAB — PREPARE RBC (CROSSMATCH)

## 2019-12-07 MED ORDER — PEG 3350-KCL-NA BICARB-NACL 420 G PO SOLR
4000.0000 mL | Freq: Once | ORAL | Status: AC
  Administered 2019-12-07: 4000 mL via ORAL

## 2019-12-07 MED ORDER — SODIUM CHLORIDE 0.9% IV SOLUTION: Freq: Once | INTRAVENOUS | Status: AC

## 2019-12-07 NOTE — Progress Notes (Signed)
  Patient without any complaints this morning.  Feels better after 1 unit of packed RBCs.  Hemoglobin 7.1 this morning   Vital signs in last 24 hours: Temp:  [97.9 F (36.6 C)-98.6 F (37 C)] 98.3 F (36.8 C) (05/15 0800) Pulse Rate:  [54-96] 61 (05/15 0800) Resp:  [16-21] 16 (05/15 0800) BP: (135-178)/(45-102) 168/70 (05/15 0800) SpO2:  [93 %-100 %] 98 % (05/15 0800)   General:   Alert,   pleasant and cooperative in NAD Abdomen:  Soft, nontender and nondistended.  Normal bowel sounds, without guarding, and without rebound.  No mass or organomegaly. Extremities:  Without clubbing or edema.    Intake/Output from previous day: 05/14 0701 - 05/15 0700 In: 850 [I.V.:200; Blood:650] Out: 3 [Urine:3] Intake/Output this shift: No intake/output data recorded.  Lab Results: Recent Labs    12/06/19 1414 12/07/19 0701  WBC 7.0 5.5  HGB 6.7* 7.1*  HCT 24.1* 25.9*  PLT 350 304   BMET Recent Labs    12/06/19 1414 12/07/19 0701  NA 137 141  K 3.5 3.7  CL 105 108  CO2 24 26  GLUCOSE 108* 106*  BUN 21 14  CREATININE 0.96 0.68  CALCIUM 9.9 9.9   LFT Recent Labs    12/07/19 0701  PROT 6.1*  ALBUMIN 3.6  AST 15  ALT 16  ALKPHOS 60  BILITOT 0.7   PT/INR Recent Labs    12/06/19 1414  LABPROT 16.1*  INR 1.3*     Impression:   Very pleasant 79 year old lady admitted with symptomatic anemia.  Found to be severely iron deficient.  Occult blood positive stool.  Aside from symptoms related to anemia, she really does not have any specific GI symptoms.  Limited sigmoidoscopy 4 years ago revealing diverticulosis a small adenoma.  Negative upstream colon, based on VC at least. Recent anticoagulation with Eliquis due to a pulmonary embolus does somewhat complicate the case. She needs a GI evaluation.  Recommendations: I have offered the patient both an diagnostic EGD and colonoscopy tomorrow while she is off anticoagulation.The risks, benefits, limitations, imponderables and  alternatives regarding both EGD and colonoscopy have been reviewed with the patient. Questions have been answered.  She is agreeable. I specifically discussed the difficulties encountered at her prior colonoscopy which could not be completed.  I told her I would make my best effort to complete her examination.  However, it may not be feasible. Continue clear liquids.  Begin colonoscopy prep later today.  Plan for tomorrow morning at 10 AM.

## 2019-12-07 NOTE — Plan of Care (Signed)
  Problem: Education: Goal: Knowledge of General Education information will improve Description: Including pain rating scale, medication(s)/side effects and non-pharmacologic comfort measures Outcome: Progressing   Problem: Clinical Measurements: Goal: Ability to maintain clinical measurements within normal limits will improve Outcome: Progressing Goal: Will remain free from infection Outcome: Progressing   

## 2019-12-07 NOTE — Progress Notes (Signed)
Patient received one unit of blood with no complications.

## 2019-12-07 NOTE — H&P (View-Only) (Signed)
  Patient without any complaints this morning.  Feels better after 1 unit of packed RBCs.  Hemoglobin 7.1 this morning   Vital signs in last 24 hours: Temp:  [97.9 F (36.6 C)-98.6 F (37 C)] 98.3 F (36.8 C) (05/15 0800) Pulse Rate:  [54-96] 61 (05/15 0800) Resp:  [16-21] 16 (05/15 0800) BP: (135-178)/(45-102) 168/70 (05/15 0800) SpO2:  [93 %-100 %] 98 % (05/15 0800)   General:   Alert,   pleasant and cooperative in NAD Abdomen:  Soft, nontender and nondistended.  Normal bowel sounds, without guarding, and without rebound.  No mass or organomegaly. Extremities:  Without clubbing or edema.    Intake/Output from previous day: 05/14 0701 - 05/15 0700 In: 850 [I.V.:200; Blood:650] Out: 3 [Urine:3] Intake/Output this shift: No intake/output data recorded.  Lab Results: Recent Labs    12/06/19 1414 12/07/19 0701  WBC 7.0 5.5  HGB 6.7* 7.1*  HCT 24.1* 25.9*  PLT 350 304   BMET Recent Labs    12/06/19 1414 12/07/19 0701  NA 137 141  K 3.5 3.7  CL 105 108  CO2 24 26  GLUCOSE 108* 106*  BUN 21 14  CREATININE 0.96 0.68  CALCIUM 9.9 9.9   LFT Recent Labs    12/07/19 0701  PROT 6.1*  ALBUMIN 3.6  AST 15  ALT 16  ALKPHOS 60  BILITOT 0.7   PT/INR Recent Labs    12/06/19 1414  LABPROT 16.1*  INR 1.3*     Impression:   Very pleasant 79 year old lady admitted with symptomatic anemia.  Found to be severely iron deficient.  Occult blood positive stool.  Aside from symptoms related to anemia, she really does not have any specific GI symptoms.  Limited sigmoidoscopy 4 years ago revealing diverticulosis a small adenoma.  Negative upstream colon, based on VC at least. Recent anticoagulation with Eliquis due to a pulmonary embolus does somewhat complicate the case. She needs a GI evaluation.  Recommendations: I have offered the patient both an diagnostic EGD and colonoscopy tomorrow while she is off anticoagulation.The risks, benefits, limitations, imponderables and  alternatives regarding both EGD and colonoscopy have been reviewed with the patient. Questions have been answered.  She is agreeable. I specifically discussed the difficulties encountered at her prior colonoscopy which could not be completed.  I told her I would make my best effort to complete her examination.  However, it may not be feasible. Continue clear liquids.  Begin colonoscopy prep later today.  Plan for tomorrow morning at 10 AM.

## 2019-12-07 NOTE — Progress Notes (Signed)
PROGRESS NOTE    [name] [name]  [id number] DOB: 01-Aug-1940 DOA: 12/06/2019 PCP: Loman Brooklyn, FNP    Brief Narrative:  HPI: [name] [name]  is a 79 y.o. female, with past medical history of hypothyroidism, hypertension, breast cancer, arthritis, patient diagnosis of PE 03/2020, started on Eliquis, and was sent by her PCP for anemia, patient saw her PCP 5/11, complaining of generalized weakness and fatigue, she had normal labs done which did show a hemoglobin of 6.9, so she was referred to ED for further evaluation, she was started on Eliquis last September for new diagnosis of unprovoked PE, patient reports some generalized weakness, fatigue, she denies any chest pain, or shortness of breath, no coffee-ground emesis, no melena or bright red blood per rectum, most recent colonoscopy 2017 significant for diverticulosis and small polyp which were removed. -In ED repeat hemoglobin was 6.7, she was ordered 1 unit PRBC, was Hemoccult positive, GI were consulted.  Hospitalist consulted to admit.   Assessment & Plan:   Active Problems:   Symptomatic anemia   Type 2 diabetes mellitus without complication, without long-term current use of insulin (HCC)   Essential hypertension   Hypothyroidism   Hyperlipidemia associated with type 2 diabetes mellitus (HCC)   Gastroesophageal reflux disease without esophagitis   Chronic gout without tophus   Pulmonary embolism ([place])   1. Symptomatic anemia from possible chronic GI blood loss.  Hemoglobin 6.7 on admission.  She was transfused 1 unit of PRBC with a follow-up hemoglobin of 7.1.  Since she is still mildly weak, will transfuse another unit of PRBC.  Anticoagulation currently on hold.  Seen by gastroenterology with plans for EGD/colonoscopy in a.m. 2. History of pulmonary embolism/right lower extremity DVT.  Diagnosed in 03/2019.  This was an unprovoked event.  Resume Eliquis when cleared by GI. 3. Diabetes.  Oral regimen currently on hold.  Continue on  sliding scale insulin. 4. Hypothyroidism.  Continue on Synthroid. 5. GERD.  Continue on PPI 6. Hypertension.  Continued on losartan and amlodipine. 7. History of gout.  Continue allopurinol 8. Hyperlipidemia.  Continue statin.   DVT prophylaxis: SCDs Code Status: Full code Family Communication:updated daughter over the phone Disposition Plan: Status is: Inpatient  Remains inpatient appropriate because:Ongoing diagnostic testing needed not appropriate for outpatient work up.  Patient needs continued GI work-up with colonoscopy.   Dispo: The patient is from: Home              Anticipated d/c is to: Home              Anticipated d/c date is: 1 day              Patient currently is not medically stable to d/c.          Consultants:   Gastroenterology  Procedures:     Antimicrobials:       Subjective: Feeling mildly better than she did yesterday, still feels weak and tired.  Denies any shortness of breath.  Objective: Vitals:   12/07/19 0800 12/07/19 1434 12/07/19 1801 12/07/19 1816  BP: (!) 168/70 (!) 157/72 (!) 165/85 (!) 166/76  Pulse: 61 (!) 58 (!) 49 (!) 58  Resp: 16 16 16 16   Temp: 98.3 F (36.8 C) 98.4 F (36.9 C) 97.6 F (36.4 C) 98.1 F (36.7 C)  TempSrc: Oral Oral Oral Oral  SpO2: 98% 99% 100% 99%    Intake/Output Summary (Last 24 hours) at 12/07/2019 1848 Last data filed at 12/07/2019 1807 Gross per 24  hour  Intake 1570 ml  Output 4 ml  Net 1566 ml   There were no vitals filed for this visit.  Examination:  General exam: Appears calm and comfortable  Respiratory system: Clear to auscultation. Respiratory effort normal. Cardiovascular system: S1 & S2 heard, RRR. No JVD, murmurs, rubs, gallops or clicks. No pedal edema. Gastrointestinal system: Abdomen is nondistended, soft and nontender. No organomegaly or masses felt. Normal bowel sounds heard. Central nervous system: Alert and oriented. No focal neurological deficits. Extremities:  Symmetric 5 x 5 power. Skin: No rashes, lesions or ulcers Psychiatry: Judgement and insight appear normal. Mood & affect appropriate.     Data Reviewed: I have personally reviewed following labs and imaging studies  CBC: Recent Labs  Lab 12/03/19 0758 12/06/19 1414 12/07/19 0701  WBC 6.0 7.0 5.5  NEUTROABS 3.1 3.7  --   HGB 6.9* 6.7* 7.1*  HCT 25.3* 24.1* 25.9*  MCV 71* 68.7* 71.7*  PLT 387 350 [id number]   Basic Metabolic Panel: Recent Labs  Lab 12/03/19 0758 12/06/19 1414 12/07/19 0701  NA 141 137 141  K 4.6 3.5 3.7  CL 106 105 108  CO2 21 24 26   GLUCOSE 106* 108* 106*  BUN 20 21 14   CREATININE 0.95 0.96 0.68  CALCIUM 11.1* 9.9 9.9   GFR: Estimated Creatinine Clearance: 64.3 mL/min (by C-G formula based on SCr of 0.68 mg/dL). Liver Function Tests: Recent Labs  Lab 12/03/19 0758 12/07/19 0701  AST 15 15  ALT 12 16  ALKPHOS 73 60  BILITOT 0.3 0.7  PROT 6.5 6.1*  ALBUMIN 4.5 3.6   No results for input(s): LIPASE, AMYLASE in the last 168 hours. No results for input(s): AMMONIA in the last 168 hours. Coagulation Profile: Recent Labs  Lab 12/06/19 1414  INR 1.3*   Cardiac Enzymes: No results for input(s): CKTOTAL, CKMB, CKMBINDEX, TROPONINI in the last 168 hours. BNP (last 3 results) No results for input(s): PROBNP in the last 8760 hours. HbA1C: Recent Labs    12/06/19 1554  HGBA1C 6.1*   CBG: Recent Labs  Lab 12/06/19 2158 12/07/19 1119 12/07/19 1619  GLUCAP 108* 105* 94   Lipid Profile: No results for input(s): CHOL, HDL, LDLCALC, TRIG, CHOLHDL, LDLDIRECT in the last 72 hours. Thyroid Function Tests: No results for input(s): TSH, T4TOTAL, FREET4, T3FREE, THYROIDAB in the last 72 hours. Anemia Panel: Recent Labs    12/06/19 1520  VITAMINB12 73*  FOLATE 19.7  FERRITIN 3*  TIBC 506*  IRON 14*  RETICCTPCT 1.9   Sepsis Labs: No results for input(s): PROCALCITON, LATICACIDVEN in the last 168 hours.  Recent Results (from the past 240  hour(s))  SARS Coronavirus 2 by RT PCR (hospital order, performed in [facility] hospital lab) Nasopharyngeal Nasopharyngeal Swab     Status: None   Collection Time: 12/06/19  3:54 PM   Specimen: Nasopharyngeal Swab  Result Value Ref Range Status   SARS Coronavirus 2 NEGATIVE NEGATIVE Final    Comment: (NOTE) SARS-CoV-2 target nucleic acids are NOT DETECTED. The SARS-CoV-2 RNA is generally detectable in upper and lower respiratory specimens during the acute phase of infection. The lowest concentration of SARS-CoV-2 viral copies this assay can detect is 250 copies / mL. A negative result does not preclude SARS-CoV-2 infection and should not be used as the sole basis for treatment or other patient management decisions.  A negative result may occur with improper specimen collection / handling, submission of specimen other than nasopharyngeal swab, presence of viral mutation(s) within  the areas targeted by this assay, and inadequate number of viral copies (<250 copies / mL). A negative result must be combined with clinical observations, patient history, and epidemiological information. Fact Sheet for Patients:   StrictlyIdeas.no Fact Sheet for Healthcare Providers: BankingDealers.co.za This test is not yet approved or cleared  by the [place] FDA and has been authorized for detection and/or diagnosis of SARS-CoV-2 by FDA under an Emergency Use Authorization (EUA).  This EUA will remain in effect (meaning this test can be used) for the duration of the COVID-19 declaration under Section 564(b)(1) of the Act, 21 U.S.C. section 360bbb-3(b)(1), unless the authorization is terminated or revoked sooner. Performed at [facility], [address], [place], [place] [address]          Radiology Studies: No results found.      Scheduled Meds: . allopurinol  100 mg Oral Daily  . amLODipine  5 mg Oral Daily  . cholecalciferol  5,000  Units Oral Daily  . furosemide  20 mg Oral BID  . insulin aspart  0-15 Units Subcutaneous TID WC  . levothyroxine  25 mcg Oral QAC breakfast  . losartan  100 mg Oral q morning - 10a  . oxybutynin  5 mg Oral TID  . pantoprazole  40 mg Oral Daily  . simvastatin  40 mg Oral q1800   Continuous Infusions: . sodium chloride       LOS: 0 days    Time spent: 42mins    Kathie Dike, MD [facility]   If 7PM-7AM, please contact night-coverage [email or web address]  12/07/2019, 6:48 PM

## 2019-12-07 NOTE — Progress Notes (Signed)
Patient has completed bowel prep, reports that stools have become clear. Will make NPO at midnight.

## 2019-12-08 ENCOUNTER — Encounter (HOSPITAL_COMMUNITY)
Admission: EM | Disposition: A | Discharge status: Home / Self Care | Payer: Self-pay | Source: Home / Self Care | Attending: Internal Medicine

## 2019-12-08 DIAGNOSIS — K635 Polyp of colon: Secondary | ICD-10-CM

## 2019-12-08 DIAGNOSIS — K3189 Other diseases of stomach and duodenum: Secondary | ICD-10-CM

## 2019-12-08 HISTORY — PX: ESOPHAGOGASTRODUODENOSCOPY: SHX5428

## 2019-12-08 HISTORY — PX: POLYPECTOMY: SHX5525

## 2019-12-08 HISTORY — PX: COLONOSCOPY: SHX5424

## 2019-12-08 HISTORY — PX: BIOPSY: SHX5522

## 2019-12-08 LAB — TYPE AND SCREEN
ABO/RH(D): O POS
Antibody Screen: NEGATIVE
Unit division: 0
Unit division: 0

## 2019-12-08 LAB — BPAM RBC
Blood Product Expiration Date: 202105272359
Blood Product Expiration Date: 202106212359
ISSUE DATE / TIME: 202105142004
ISSUE DATE / TIME: 202105151747
Unit Type and Rh: 5100
Unit Type and Rh: 5100

## 2019-12-08 LAB — CBC
HCT: 28.4 % — ABNORMAL LOW (ref 36.0–46.0)
Hemoglobin: 8.3 g/dL — ABNORMAL LOW (ref 12.0–15.0)
MCH: 20.8 pg — ABNORMAL LOW (ref 26.0–34.0)
MCHC: 29.2 g/dL — ABNORMAL LOW (ref 30.0–36.0)
MCV: 71.2 fL — ABNORMAL LOW (ref 80.0–100.0)
Platelets: 319 10*3/uL (ref 150–400)
RBC: 3.99 MIL/uL (ref 3.87–5.11)
RDW: 19.2 % — ABNORMAL HIGH (ref 11.5–15.5)
WBC: 6.5 10*3/uL (ref 4.0–10.5)
nRBC: 0 % (ref 0.0–0.2)

## 2019-12-08 LAB — GLUCOSE, CAPILLARY
Glucose-Capillary: 101 mg/dL — ABNORMAL HIGH (ref 70–99)
Glucose-Capillary: 92 mg/dL (ref 70–99)
Glucose-Capillary: 105 mg/dL — ABNORMAL HIGH (ref 70–99)
Glucose-Capillary: 158 mg/dL — ABNORMAL HIGH (ref 70–99)

## 2019-12-08 LAB — OCCULT BLOOD, POC DEVICE: Fecal Occult Bld: POSITIVE — AB

## 2019-12-08 SURGERY — EGD (ESOPHAGOGASTRODUODENOSCOPY)
Anesthesia: Moderate Sedation

## 2019-12-08 MED ORDER — ATROPINE SULFATE 1 MG/ML IJ SOLN
INTRAMUSCULAR | Status: AC
  Filled 2019-12-08: qty 1

## 2019-12-08 MED ORDER — LIDOCAINE VISCOUS HCL 2 % MT SOLN
OROMUCOSAL | Status: AC
  Filled 2019-12-08: qty 15

## 2019-12-08 MED ORDER — MIDAZOLAM HCL 5 MG/5ML IJ SOLN
INTRAMUSCULAR | Status: AC
  Filled 2019-12-08: qty 5

## 2019-12-08 MED ORDER — FLUMAZENIL 0.5 MG/5ML IV SOLN
INTRAVENOUS | Status: AC
  Filled 2019-12-08: qty 5

## 2019-12-08 MED ORDER — MEPERIDINE HCL 50 MG/ML IJ SOLN
INTRAMUSCULAR | Status: AC
  Filled 2019-12-08: qty 1

## 2019-12-08 MED ORDER — LIDOCAINE VISCOUS HCL 2 % MT SOLN
OROMUCOSAL | Status: DC | PRN
  Administered 2019-12-08: 1 via OROMUCOSAL

## 2019-12-08 MED ORDER — MIDAZOLAM HCL 5 MG/5ML IJ SOLN
INTRAMUSCULAR | Status: AC
  Filled 2019-12-08: qty 10

## 2019-12-08 MED ORDER — MIDAZOLAM HCL 5 MG/5ML IJ SOLN
INTRAMUSCULAR | Status: DC | PRN
  Administered 2019-12-08: 1 mg via INTRAVENOUS
  Administered 2019-12-08: 0.5 mg via INTRAVENOUS
  Administered 2019-12-08 (×2): 1 mg via INTRAVENOUS
  Administered 2019-12-08 (×2): 0.5 mg via INTRAVENOUS
  Administered 2019-12-08: 2 mg via INTRAVENOUS
  Administered 2019-12-08 (×2): 0.5 mg via INTRAVENOUS
  Administered 2019-12-08: 2 mg via INTRAVENOUS
  Administered 2019-12-08: 0.5 mg via INTRAVENOUS
  Administered 2019-12-08 (×3): 1 mg via INTRAVENOUS

## 2019-12-08 MED ORDER — MEPERIDINE HCL 100 MG/ML IJ SOLN
INTRAMUSCULAR | Status: DC | PRN
  Administered 2019-12-08: 15 mg via INTRAVENOUS
  Administered 2019-12-08: 10 mg via INTRAVENOUS
  Administered 2019-12-08: 25 mg via INTRAVENOUS

## 2019-12-08 MED ORDER — ONDANSETRON HCL 4 MG/2ML IJ SOLN
INTRAMUSCULAR | Status: DC | PRN
  Administered 2019-12-08: 4 mg via INTRAVENOUS

## 2019-12-08 MED ORDER — ONDANSETRON HCL 4 MG/2ML IJ SOLN
INTRAMUSCULAR | Status: AC
  Filled 2019-12-08: qty 2

## 2019-12-08 MED ORDER — EPINEPHRINE 1 MG/10ML IJ SOSY
PREFILLED_SYRINGE | INTRAMUSCULAR | Status: AC
  Filled 2019-12-08: qty 10

## 2019-12-08 MED ORDER — NALOXONE HCL 0.4 MG/ML IJ SOLN
INTRAMUSCULAR | Status: AC
  Filled 2019-12-08: qty 1

## 2019-12-08 NOTE — Op Note (Signed)
[facility] Patient Name: [name] [name] Procedure Date: 12/08/2019 9:28 AM MRN: [id number] Date of Birth: [birth year] Attending MD: Norvel Richards , MD CSN: [id number] Age: 79 Admit Type: Inpatient Procedure:                Upper GI endoscopy Indications:              Iron deficiency anemia, Heme positive stool Providers:                Norvel Richards, MD, Lurline Del, RN, Nelda Severe, RN Referring MD:              Medicines:                Midazolam 6 mg IV, Meperidine 40 mg IV Complications:            No immediate complications. Estimated Blood Loss:     Estimated blood loss was minimal. Procedure:                Pre-Anesthesia Assessment:                           - Prior to the procedure, a History and Physical                            was performed, and patient medications and                            allergies were reviewed. The patient's tolerance of                            previous anesthesia was also reviewed. The risks                            and benefits of the procedure and the sedation                            options and risks were discussed with the patient.                            All questions were answered, and informed consent                            was obtained. Prior Anticoagulants: The patient has                            taken no previous anticoagulant or antiplatelet                            agents. ASA Grade Assessment: II - A patient with                            mild systemic disease. After reviewing the risks  and benefits, the patient was deemed in                            satisfactory condition to undergo the procedure.                           After obtaining informed consent, the endoscope was                            passed under direct vision. Throughout the                            procedure, the patient's blood pressure, pulse, and           oxygen saturations were monitored continuously. The                            [id number] scope was introduced through the                            mouth, and advanced to the second part of duodenum.                            The upper GI endoscopy was accomplished without                            difficulty. The patient tolerated the procedure                            well. Scope In: 10:28:13 AM Scope Out: 10:36:21 AM Total Procedure Duration: 0 hours 8 minutes 8 seconds  Findings:      The examined esophagus was normal.      Minimal polypoid antral mucosa with some hemorrhagic mucosal changes       (mild). No ulcer or infiltrating process seen.      The duodenal bulb and second portion of the duodenum were normal.       Abnormal appearing antral mucosa was biopsied with a cold forceps for       histology. Estimated blood loss was minimal. Impression:               - Normal esophagus.                           Mildly abnormal antrum. Query early GAVE. Appeared                            innocent. Status post biopsy                           - Normal duodenal bulb and second portion of the                            duodenum. Moderate Sedation:      Moderate (conscious) sedation was administered by the endoscopy nurse       and supervised by the endoscopist. The following parameters were  monitored: oxygen saturation, heart rate, blood pressure, respiratory       rate, EKG, adequacy of pulmonary ventilation, and response to care.       Total physician intraservice time was 21 minutes. Recommendation:           - Patient has a contact number available for                            emergencies. The signs and symptoms of potential                            delayed complications were discussed with the                            patient. Return to normal activities tomorrow.                            Written discharge instructions were provided to the                             patient.                           - Continue present medications. Follow H&H.                            Follow-up on pathology. See colonoscopy report. Procedure Code(s):        --- Professional ---                           [phone], Esophagogastroduodenoscopy, flexible,                            transoral; with biopsy, single or multiple                           G0500, Moderate sedation services provided by the                            same physician or other qualified health care                            professional performing a gastrointestinal                            endoscopic service that sedation supports,                            requiring the presence of an independent trained                            observer to assist in the monitoring of the                            patient's level of consciousness and physiological  status; initial 15 minutes of intra-service time;                            patient age 31 years or older (additional time may                            be reported with [phone], as appropriate) Diagnosis Code(s):        --- Professional ---                           K31.89, Other diseases of stomach and duodenum                           D50.9, Iron deficiency anemia, unspecified                           R19.5, Other fecal abnormalities CPT copyright 2019 American Medical Association. All rights reserved. The codes documented in this report are preliminary and upon coder review may  be revised to meet current compliance requirements. Cristopher Estimable. Ramiro Pangilinan, MD Norvel Richards, MD 12/08/2019 10:48:50 AM This report has been signed electronically. Number of Addenda: 0

## 2019-12-08 NOTE — Progress Notes (Signed)
PROGRESS NOTE    [name] [name]  [id number] DOB: [birth year] DOA: 12/06/2019 PCP: Loman Brooklyn, FNP    Brief Narrative:  HPI: [name] [name]  is a 79 y.o. female, with past medical history of hypothyroidism, hypertension, breast cancer, arthritis, patient diagnosis of PE 03/2020, started on Eliquis, and was sent by her PCP for anemia, patient saw her PCP 5/11, complaining of generalized weakness and fatigue, she had normal labs done which did show a hemoglobin of 6.9, so she was referred to ED for further evaluation, she was started on Eliquis last September for new diagnosis of unprovoked PE, patient reports some generalized weakness, fatigue, she denies any chest pain, or shortness of breath, no coffee-ground emesis, no melena or bright red blood per rectum, most recent colonoscopy 2017 significant for diverticulosis and small polyp which were removed. -In ED repeat hemoglobin was 6.7, she was ordered 1 unit PRBC, was Hemoccult positive, GI were consulted.  Hospitalist consulted to admit.   Assessment & Plan:   Active Problems:   Symptomatic anemia   Type 2 diabetes mellitus without complication, without long-term current use of insulin (HCC)   Essential hypertension   Hypothyroidism   Hyperlipidemia associated with type 2 diabetes mellitus (HCC)   Gastroesophageal reflux disease without esophagitis   Chronic gout without tophus   Pulmonary embolism ([place])   1. Symptomatic anemia from possible chronic GI blood loss.  Hemoglobin 6.7 on admission.  She was transfused 2 units PRBC with follow-up hemoglobin at 8.3.  Anticoagulation currently on hold.  Seen by gastroenterology and underwent EGD/colonoscopy on 5/16.  Results noted as below.  Continue to follow hemoglobin.  Await further input regarding resumption of anticoagulation. 2. History of pulmonary embolism/right lower extremity DVT.  Diagnosed in 03/2019.  This was an unprovoked event.  Resume Eliquis when cleared by GI. 3. Diabetes.   Oral regimen currently on hold.  Continue on sliding scale insulin.  Blood sugar stable 4. Hypothyroidism.  Continue on Synthroid. 5. GERD.  Continue on PPI 6. Hypertension.  Continued on losartan and amlodipine. 7. History of gout.  Continue allopurinol 8. Hyperlipidemia.  Continue statin.   DVT prophylaxis: SCDs Code Status: Full code Family Communication:updated daughter at the bedside Disposition Plan: Status is: Inpatient  Remains inpatient appropriate because:Ongoing diagnostic testing needed not appropriate for outpatient work up.  Patient needs continued observation to ensure hemoglobin is stable.  Await further input from GI regarding resumption of anticoagulation.   Dispo: The patient is from: Home              Anticipated d/c is to: Home              Anticipated d/c date is: 1 day              Patient currently is not medically stable to d/c.   Consultants:   Gastroenterology  Procedures:  Colonoscopy: - Three 4 to 6 mm polyps in the sigmoid colon, in                            the descending colon and at the hepatic flexure,                            removed with a cold snare. Resected and retrieved.                           -  Diverticulosis in the sigmoid colon, in the                            descending colon and in the transverse colon.                            Colonic lipoma.                           - The distal rectum and anal verge are normal on                            retroflexion view. Both a noncompliant and                             redundant colon made the exam more challenging.  EGD: - Normal esophagus.                           Mildly abnormal antrum. Query early GAVE. Appeared                            innocent. Status post biopsy                           - Normal duodenal bulb and second portion of the                            duodenum  Antimicrobials:       Subjective: Feeling mildly better than she did yesterday, still  feels weak and tired.  Denies any shortness of breath.  Objective: Vitals:   12/08/19 1345 12/08/19 1350 12/08/19 1422 12/08/19 1433  BP: (!) 157/91  (!) 166/137 (!) 166/137  Pulse: (!) 56 66 62   Resp: 20 15 18    Temp:   97.9 F (36.6 C)   TempSrc:   Oral   SpO2: 100% 100% 98%     Intake/Output Summary (Last 24 hours) at 12/08/2019 1844 Last data filed at 12/08/2019 0400 Gross per 24 hour  Intake 424 ml  Output --  Net 424 ml   There were no vitals filed for this visit.  Examination:  General exam: Appears calm and comfortable  Respiratory system: Clear to auscultation. Respiratory effort normal. Cardiovascular system: S1 & S2 heard, RRR. No JVD, murmurs, rubs, gallops or clicks. No pedal edema. Gastrointestinal system: Abdomen is nondistended, soft and nontender. No organomegaly or masses felt. Normal bowel sounds heard. Central nervous system: Alert and oriented. No focal neurological deficits. Extremities: Symmetric 5 x 5 power. Skin: No rashes, lesions or ulcers Psychiatry: Judgement and insight appear normal. Mood & affect appropriate.     Data Reviewed: I have personally reviewed following labs and imaging studies  CBC: Recent Labs  Lab 12/03/19 0758 12/06/19 1414 12/07/19 0701 12/08/19 0636  WBC 6.0 7.0 5.5 6.5  NEUTROABS 3.1 3.7  --   --   HGB 6.9* 6.7* 7.1* 8.3*  HCT 25.3* 24.1* 25.9* 28.4*  MCV 71* 68.7* 71.7* 71.2*  PLT 387 350 304 [id number]   Basic Metabolic Panel: Recent Labs  Lab 12/03/19 0758 12/06/19 1414 12/07/19 0701  NA 141 137 141  K 4.6 3.5 3.7  CL 106 105 108  CO2 21 24 26   GLUCOSE 106* 108* 106*  BUN 20 21 14   CREATININE 0.95 0.96 0.68  CALCIUM 11.1* 9.9 9.9   GFR: Estimated Creatinine Clearance: 64.3 mL/min (by C-G formula based on SCr of 0.68 mg/dL). Liver Function Tests: Recent Labs  Lab 12/03/19 0758 12/07/19 0701  AST 15 15  ALT 12 16  ALKPHOS 73 60  BILITOT 0.3 0.7  PROT 6.5 6.1*  ALBUMIN 4.5 3.6   No results for  input(s): LIPASE, AMYLASE in the last 168 hours. No results for input(s): AMMONIA in the last 168 hours. Coagulation Profile: Recent Labs  Lab 12/06/19 1414  INR 1.3*   Cardiac Enzymes: No results for input(s): CKTOTAL, CKMB, CKMBINDEX, TROPONINI in the last 168 hours. BNP (last 3 results) No results for input(s): PROBNP in the last 8760 hours. HbA1C: Recent Labs    12/06/19 1554  HGBA1C 6.1*   CBG: Recent Labs  Lab 12/07/19 2049 12/08/19 0745 12/08/19 0946 12/08/19 1421 12/08/19 1621  GLUCAP 91 101* 92 105* 158*   Lipid Profile: No results for input(s): CHOL, HDL, LDLCALC, TRIG, CHOLHDL, LDLDIRECT in the last 72 hours. Thyroid Function Tests: No results for input(s): TSH, T4TOTAL, FREET4, T3FREE, THYROIDAB in the last 72 hours. Anemia Panel: Recent Labs    12/06/19 1520  VITAMINB12 73*  FOLATE 19.7  FERRITIN 3*  TIBC 506*  IRON 14*  RETICCTPCT 1.9   Sepsis Labs: No results for input(s): PROCALCITON, LATICACIDVEN in the last 168 hours.  Recent Results (from the past 240 hour(s))  SARS Coronavirus 2 by RT PCR (hospital order, performed in [facility] hospital lab) Nasopharyngeal Nasopharyngeal Swab     Status: None   Collection Time: 12/06/19  3:54 PM   Specimen: Nasopharyngeal Swab  Result Value Ref Range Status   SARS Coronavirus 2 NEGATIVE NEGATIVE Final    Comment: (NOTE) SARS-CoV-2 target nucleic acids are NOT DETECTED. The SARS-CoV-2 RNA is generally detectable in upper and lower respiratory specimens during the acute phase of infection. The lowest concentration of SARS-CoV-2 viral copies this assay can detect is 250 copies / mL. A negative result does not preclude SARS-CoV-2 infection and should not be used as the sole basis for treatment or other patient management decisions.  A negative result may occur with improper specimen collection / handling, submission of specimen other than nasopharyngeal swab, presence of viral mutation(s) within  the areas targeted by this assay, and inadequate number of viral copies (<250 copies / mL). A negative result must be combined with clinical observations, patient history, and epidemiological information. Fact Sheet for Patients:   StrictlyIdeas.no Fact Sheet for Healthcare Providers: BankingDealers.co.za This test is not yet approved or cleared  by the [place] FDA and has been authorized for detection and/or diagnosis of SARS-CoV-2 by FDA under an Emergency Use Authorization (EUA).  This EUA will remain in effect (meaning this test can be used) for the duration of the COVID-19 declaration under Section 564(b)(1) of the Act, 21 U.S.C. section 360bbb-3(b)(1), unless the authorization is terminated or revoked sooner. Performed at [facility], [address] [place], [place] [address]          Radiology Studies: No results found.      Scheduled Meds: . allopurinol  100 mg Oral Daily  . amLODipine  5 mg Oral Daily  . atropine      . cholecalciferol  5,000 Units Oral Daily  .  furosemide  20 mg Oral BID  . insulin aspart  0-15 Units Subcutaneous TID WC  . levothyroxine  25 mcg Oral QAC breakfast  . lidocaine      . losartan  100 mg Oral q morning - 10a  . meperidine      . midazolam      . midazolam      . ondansetron      . oxybutynin  5 mg Oral TID  . pantoprazole  40 mg Oral Daily  . simvastatin  40 mg Oral q1800   Continuous Infusions: . sodium chloride       LOS: 1 day    Time spent: 59mins    Kathie Dike, MD [facility]   If 7PM-7AM, please contact night-coverage [email or web address]  12/08/2019, 6:44 PM

## 2019-12-08 NOTE — Op Note (Signed)
[facility] Patient Name: [name] [name] Procedure Date: 12/08/2019 10:40 AM MRN: [id number] Date of Birth: [birth year] Attending MD: Norvel Richards , MD CSN: [id number] Age: 79 Admit Type: Inpatient Procedure:                Colonoscopy Indications:              Heme positive stool, Iron deficiency anemia Providers:                Norvel Richards, MD, Lurline Del, RN, Nelda Severe, RN Referring MD:              Medicines:                Midazolam 13 mg IV, Meperidine 50 mg IV,                            Ondansetron 4 mg IV Complications:            No immediate complications. Estimated Blood Loss:     Estimated blood loss was minimal. Procedure:                Pre-Anesthesia Assessment:                           - Prior to the procedure, a History and Physical                            was performed, and patient medications and                            allergies were reviewed. The patient's tolerance of                            previous anesthesia was also reviewed. The risks                            and benefits of the procedure and the sedation                            options and risks were discussed with the patient.                            All questions were answered, and informed consent                            was obtained. Prior Anticoagulants: The patient has                            taken no previous anticoagulant or antiplatelet                            agents. ASA Grade Assessment: II - A patient with  mild systemic disease. After reviewing the risks                            and benefits, the patient was deemed in                            satisfactory condition to undergo the procedure.                           After obtaining informed consent, the colonoscope                            was passed under direct vision. Throughout the                            procedure, the patient's  blood pressure, pulse, and                            oxygen saturations were monitored continuously. The                            [id number] scope was introduced through                            the anus and advanced to the the cecum, identified                            by appendiceal orifice and ileocecal valve. The                            colonoscopy was extremely difficult due to                            restricted mobility of the left colon and                            redundancy of the right colon.. I initially                            utilized a pediatric colonoscope. I was unable to                            advance the scope beyond the descending colon due                            to noncompliance and poor visualization of the                            upstream lumen. I withdrew the scope and obtained                            the ultraslim scope; with this instrument along  with changing of the patient's position and                            external abdominal pressure, I eventually reached                            the cecum. Scope In: 10:52:05 AM Scope Out: 12:46:30 PM Scope Withdrawal Time: 0 hours 14 minutes 20 seconds  Total Procedure Duration: 1 hour 54 minutes 25 seconds  Findings:      Three sessile polyps were found in the sigmoid colon, descending colon       and hepatic flexure. The polyps were 4 to 6 mm in size. These polyps       were removed with a cold snare. Resection and retrieval were complete.       Estimated blood loss was minimal.      Scattered medium-mouthed diverticula were found in the sigmoid colon,       descending colon and transverse colon. There is a 1 cm white-yellowish       submucosal nodule in the descending colon (positive pillow sign)       consistent with a lipoma. The remainder the colonic mucosa appeared       normal.      The retroflexed view of the distal rectum and anal verge  was normal and       showed no anal or rectal abnormalities. Impression:               - Three 4 to 6 mm polyps in the sigmoid colon, in                            the descending colon and at the hepatic flexure,                            removed with a cold snare. Resected and retrieved.                           - Diverticulosis in the sigmoid colon, in the                            descending colon and in the transverse colon.                            Colonic lipoma.                           - The distal rectum and anal verge are normal on                            retroflexion view. Both a noncompliant and                            redundant colon made the exam more challenging.                            Complete examination performed. Moderate Sedation:      Moderate (conscious) sedation was  administered by the endoscopy nurse       and supervised by the endoscopist. The following parameters were       monitored: oxygen saturation, heart rate, blood pressure, respiratory       rate, EKG, adequacy of pulmonary ventilation, and response to care. 151       minutes Recommendation:           - Return patient to hospital ward for observation.                            Carbohydrate modified diet. Follow-up on pathology.                            See EGD report; will make additional                            recommendations regarding resumption of                            anticoagulation and potential further GI work-up                            tomorrow morning when she is reassessed. I called                            her daughter [name] [name], at [phone] and discussed my                            findings and recommendations. Procedure Code(s):        --- Professional ---                           [phone], Colonoscopy, flexible; with removal of                            tumor(s), polyp(s), or other lesion(s) by snare                            technique Diagnosis Code(s):         --- Professional ---                           K63.5, Polyp of colon                           R19.5, Other fecal abnormalities                           D50.9, Iron deficiency anemia, unspecified                           K57.30, Diverticulosis of large intestine without                            perforation or abscess without bleeding CPT copyright 2019 [employer]. All rights reserved. The codes documented in this report are preliminary and upon  coder review may  be revised to meet current compliance requirements. Cristopher Estimable. Loriann Bosserman, MD Norvel Richards, MD 12/08/2019 1:15:23 PM This report has been signed electronically. Number of Addenda: 0

## 2019-12-08 NOTE — Interval H&P Note (Signed)
History and Physical Interval Note:  12/08/2019 10:11 AM  [name] [name]  has presented today for surgery, with the diagnosis of GIB / IDA.  The various methods of treatment have been discussed with the patient and family. After consideration of risks, benefits and other options for treatment, the patient has consented to  Procedure(s) with comments: ESOPHAGOGASTRODUODENOSCOPY (EGD) (N/A) COLONOSCOPY (N/A) - Plan for procedure at 10 AM in endo room 2 as a surgical intervention.  The patient's history has been reviewed, patient examined, no change in status, stable for surgery.  I have reviewed the patient's chart and labs.  Questions were answered to the patient's satisfaction.     Herbie Baltimore Craig Wisnewski   Seen in the endoscopy unit.  Stable overnight without overt bleeding.  Hemoglobin 8.3.  Blood sugar 91.  Did well with prep.  EGD and colonoscopy today.  Re- reviewed with the patient.  Further recommendations to follow pending completion of EGD and colonoscopy

## 2019-12-09 ENCOUNTER — Other Ambulatory Visit: Payer: Self-pay

## 2019-12-09 ENCOUNTER — Telehealth: Payer: Self-pay | Admitting: *Deleted

## 2019-12-09 ENCOUNTER — Encounter: Payer: Self-pay | Admitting: Internal Medicine

## 2019-12-09 ENCOUNTER — Telehealth: Payer: Self-pay | Admitting: Gastroenterology

## 2019-12-09 DIAGNOSIS — K922 Gastrointestinal hemorrhage, unspecified: Secondary | ICD-10-CM | POA: Diagnosis present

## 2019-12-09 DIAGNOSIS — D649 Anemia, unspecified: Secondary | ICD-10-CM

## 2019-12-09 DIAGNOSIS — D62 Acute posthemorrhagic anemia: Secondary | ICD-10-CM | POA: Diagnosis present

## 2019-12-09 DIAGNOSIS — D509 Iron deficiency anemia, unspecified: Secondary | ICD-10-CM | POA: Diagnosis present

## 2019-12-09 DIAGNOSIS — D500 Iron deficiency anemia secondary to blood loss (chronic): Secondary | ICD-10-CM | POA: Diagnosis present

## 2019-12-09 LAB — CBC
HCT: 28.2 % — ABNORMAL LOW (ref 36.0–46.0)
Hemoglobin: 8 g/dL — ABNORMAL LOW (ref 12.0–15.0)
MCH: 20.7 pg — ABNORMAL LOW (ref 26.0–34.0)
MCHC: 28.4 g/dL — ABNORMAL LOW (ref 30.0–36.0)
MCV: 73.1 fL — ABNORMAL LOW (ref 80.0–100.0)
Platelets: 293 10*3/uL (ref 150–400)
RBC: 3.86 MIL/uL — ABNORMAL LOW (ref 3.87–5.11)
RDW: 19.7 % — ABNORMAL HIGH (ref 11.5–15.5)
WBC: 8.5 10*3/uL (ref 4.0–10.5)
nRBC: 0 % (ref 0.0–0.2)

## 2019-12-09 LAB — GLUCOSE, CAPILLARY
Glucose-Capillary: 111 mg/dL — ABNORMAL HIGH (ref 70–99)
Glucose-Capillary: 111 mg/dL — ABNORMAL HIGH (ref 70–99)

## 2019-12-09 MED ORDER — SIMVASTATIN 40 MG PO TABS: 40.0000 mg | ORAL_TABLET | Freq: Every evening | ORAL | 3 refills | Status: DC

## 2019-12-09 MED ORDER — METFORMIN HCL 500 MG PO TABS: 500.0000 mg | ORAL_TABLET | Freq: Two times a day (BID) | ORAL | 3 refills | Status: DC

## 2019-12-09 MED ORDER — FUROSEMIDE 20 MG PO TABS: 20.0000 mg | ORAL_TABLET | Freq: Two times a day (BID) | ORAL | 3 refills | Status: DC

## 2019-12-09 MED ORDER — AMLODIPINE BESYLATE 5 MG PO TABS: 5.0000 mg | ORAL_TABLET | Freq: Every day | ORAL | 3 refills | Status: DC

## 2019-12-09 MED ORDER — LEVOTHYROXINE SODIUM 25 MCG PO TABS: 25.0000 ug | ORAL_TABLET | Freq: Every day | ORAL | 3 refills | Status: DC

## 2019-12-09 MED ORDER — PANTOPRAZOLE SODIUM 40 MG PO TBEC: 40.0000 mg | DELAYED_RELEASE_TABLET | Freq: Two times a day (BID) | ORAL | Status: DC

## 2019-12-09 MED ORDER — PANTOPRAZOLE SODIUM 40 MG PO TBEC: 40.0000 mg | DELAYED_RELEASE_TABLET | Freq: Two times a day (BID) | ORAL | 3 refills | Status: DC

## 2019-12-09 NOTE — Telephone Encounter (Signed)
PATIENT SCHEDULED AND LETTER SENT  °

## 2019-12-09 NOTE — Telephone Encounter (Signed)
Spoke with pt. Pt is aware that she will need labs this week. Pt is aware of her apt. Lab orders were placed and faxed to [facility] per pts request.

## 2019-12-09 NOTE — Discharge Instructions (Signed)
1)You are taking apixaban/Eliquis which is a blood thinner so Avoid ibuprofen/Advil/Aleve/Motrin/Goody Powders/Naproxen/BC powders/Meloxicam/Diclofenac/Indomethacin and other Nonsteroidal anti-inflammatory medications as these will make you more likely to bleed and can cause stomach ulcers, can also cause Kidney problems.  2)Please call if dark stools, bloody urine or blood in your stool or nosebleed or any concerns about bleeding 3) follow-up with your primary care physician for repeat CBC/complete blood count test every Thursday for the Next 3 weeks starting 12/12/19----  4)Dr. Gala Romney in a couple weeks--- You May need capsule endoscopy and outpatient IV infusion of iron

## 2019-12-09 NOTE — Care Management Important Message (Signed)
Important Message  Patient Details  Name: [name] [name] MRN: [id number] Date of Birth: July 20, 1941   Medicare Important Message Given:  Yes     Tommy Medal 12/09/2019, 11:50 AM

## 2019-12-09 NOTE — Progress Notes (Signed)
    Subjective: No overt GI bleeding. Tolerating diet. Abdomen feels a little sore. No N/V. Eager to go home. Sitting up in chair.   Objective: Vital signs in last 24 hours: Temp:  [97.9 F (36.6 C)-99.9 F (37.7 C)] 99.9 F (37.7 C) (05/17 0000) Pulse Rate:  [48-101] 63 (05/17 0000) Resp:  [14-28] 18 (05/17 0000) BP: (95-166)/(55-137) 145/68 (05/17 0800) SpO2:  [87 %-100 %] 94 % (05/17 0006) Last BM Date: 12/08/19 General:   Alert and oriented, pleasant Head:  Normocephalic and atraumatic. Abdomen:  Bowel sounds present, soft, mild tenderness upper abdomen but without rebound or guarding, non-distended. Sitting up in chair Extremities:  Without  edema. Neurologic:  Alert and  oriented x4  Intake/Output from previous day: 05/16 0701 - 05/17 0700 In: 240 [P.O.:240] Out: -  Intake/Output this shift: No intake/output data recorded.  Lab Results: Recent Labs    12/07/19 0701 12/08/19 0636 12/09/19 0539  WBC 5.5 6.5 8.5  HGB 7.1* 8.3* 8.0*  HCT 25.9* 28.4* 28.2*  PLT 304 319 293   BMET Recent Labs    12/06/19 1414 12/07/19 0701  NA 137 141  K 3.5 3.7  CL 105 108  CO2 24 26  GLUCOSE 108* 106*  BUN 21 14  CREATININE 0.96 0.68  CALCIUM 9.9 9.9   LFT Recent Labs    12/07/19 0701  PROT 6.1*  ALBUMIN 3.6  AST 15  ALT 16  ALKPHOS 60  BILITOT 0.7   PT/INR Recent Labs    12/06/19 1414  LABPROT 16.1*  INR 1.3*    Assessment: Pleasant 79 year old female admitted with symptomatic anemia, IDA, heme positive stool, in setting of Eliquis for 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  Anemia: receiving 2 units PRBCs this admission. Hgb stable and no overt GI bleeding.   Patient desires to continue care here with [facility].   Plan: Awaiting pathology: if +H.pylori, will need to treat.  Capsule study as  outpatient if pathology unrevealing PPI daily With history of DVT/PE, resume Eliquis with serial monitoring of H/H. Will order repeat CBC for Wednesday/Thursday of this week Close outpatient follow-up to be arranged   [name], PhD, ANP-BC [facility]    LOS: 2 days    12/09/2019, 8:09 AM

## 2019-12-09 NOTE — Discharge Summary (Addendum)
[name] [name], is a 79 y.o. female  DOB [birth year]  MRN [id number].  Admission date:  12/06/2019  Admitting Physician  Kathie Dike, MD  Discharge Date:  12/09/2019   Primary MD  Loman Brooklyn, FNP  Recommendations for primary care physician for things to follow:   1)You are taking apixaban/Eliquis which is a blood thinner so Avoid ibuprofen/Advil/Aleve/Motrin/Goody Powders/Naproxen/BC powders/Meloxicam/Diclofenac/Indomethacin and other Nonsteroidal anti-inflammatory medications as these will make you more likely to bleed and can cause stomach ulcers, can also cause Kidney problems.  2)Please call if dark stools, bloody urine or blood in your stool or nosebleed or any concerns about bleeding 3) follow-up with your primary care physician for repeat CBC/complete blood count test every Thursday for the Next 3 weeks starting 12/12/19----  4)Dr. Gala Romney in a couple weeks--- You May need capsule endoscopy and outpatient IV infusion of ironkl  Admission Diagnosis  Occult blood in stools [R19.5] Symptomatic anemia [D64.9]   Discharge Diagnosis  Occult blood in stools [R19.5] Symptomatic anemia [D64.9]    Active Problems:   Symptomatic anemia   Unprovoked DVT and Unprovoked Pulmonary Embolism -Dxed 03/2019   Iron deficiency anemia due to chronic blood loss   Essential hypertension   Type 2 diabetes mellitus without complication, without long-term current use of insulin (HCC)   Hypothyroidism   Hyperlipidemia associated with type 2 diabetes mellitus (HCC)   Gastroesophageal reflux disease without esophagitis   Chronic gout without tophus   Acute on chronic blood loss anemia   Acute on chronic GI bleed on Eliquis      Past Medical History:  Diagnosis Date  . Arthritis    Ostearthritis- hips, knees, fingers  . Breast cancer ([place]) 1997   pt denies  . DVT (deep venous thrombosis) ([place])   . Headache(784.0)      tx. Valproic acid  . Hypertension   . Hypothyroidism   . Pulmonary embolism [facility])     Past Surgical History:  Procedure Laterality Date  . ABDOMINAL HYSTERECTOMY    . CATARACT EXTRACTION, BILATERAL    . JOINT REPLACEMENT Left    [id number]  . MULTIPLE TOOTH EXTRACTIONS     60's  . PARATHYROIDECTOMY    . TOTAL HIP ARTHROPLASTY Right 10/29/2013   Procedure: RIGHT TOTAL HIP ARTHROPLASTY ANTERIOR APPROACH;  Surgeon: Mauri Pole, MD;  Location: [facility];  Service: Orthopedics;  Laterality: Right;     HPI  from the history and physical done on the day of admission:    [name] [name]  is a 79 y.o. female, with past medical history of hypothyroidism, hypertension, breast cancer, arthritis, patient diagnosis of PE 03/2020, started on Eliquis, and was sent by her PCP for anemia, patient saw her PCP 5/11, complaining of generalized weakness and fatigue, she had normal labs done which did show a hemoglobin of 6.9, so she was referred to ED for further evaluation, she was started on Eliquis last September for new diagnosis of unprovoked PE, patient reports some generalized weakness, fatigue, she denies any chest pain,  or shortness of breath, no coffee-ground emesis, no melena or bright red blood per rectum, most recent colonoscopy 2017 significant for diverticulosis and small polyp which were removed. -In ED repeat hemoglobin was 6.7, she was ordered 1 unit PRBC, was Hemoccult positive, GI were consulted.  Hospitalist consulted to admit.    Hospital Course:     Brief Narrative:  [id number] a69 y.o.female,with past medical history of hypothyroidism, hypertension, breast cancer, arthritis, patient diagnosis of PE 03/2020, started on Eliquis, and was sent by her PCP for anemia, patient saw her PCP 5/11, complaining of generalized weakness and fatigue, she had normal labs done which did show a hemoglobin of 6.9, so she was referred to ED for further evaluation, she was started on Eliquis last  September for new diagnosis of unprovoked PE, patient reports some generalized weakness, fatigue, she denies any chest pain, or shortness of breath, no coffee-ground emesis, no melena or bright red blood per rectum, most recent colonoscopy 2017 significant for diverticulosis and small polyp which were removed. -In ED repeat hemoglobin was 6.7, she was ordered 1 unit PRBC, was Hemoccult positive, GI were consulted. Hospitalist consulted to admit.   Assessment & Plan:   Active Problems:   Symptomatic anemia   Type 2 diabetes mellitus without complication, without long-term current use of insulin (HCC)   Essential hypertension   Hypothyroidism   Hyperlipidemia associated with type 2 diabetes mellitus (HCC)   Gastroesophageal reflux disease without esophagitis   Chronic gout without tophus   Pulmonary embolism (HCC)   1)Symptomatic anemia from possible chronic GI blood loss.  Hemoglobin 6.7 on admission.  She was transfused 2 units PRBC with follow-up hemoglobin at 8.0.  Seen by gastroenterology and underwent EGD/colonoscopy on 5/16.  Results noted as below.   --Underwent colonoscopy with polyps, diverticulosis, colonic lipoma, and  EGD with normal esophagus with possibly early GAVE, normal duodenum, gastric biopsy pending. --- GI recommends restarting Eliquis, patient will need weekly CBCs on Thursdays starting 12/12/2019, patient will follow up with GI service in a couple weeks for recheck possible capsule endoscopy if H&H continues to drift down and if pathology/biopsies are unrevealing --Patient may benefit from outpatient IV iron infusion  2)History of unprovoked pulmonary embolism/right lower extremity DVT.  Diagnosed in 03/2019.  This was an unprovoked event.  --Anticoagulation as above in #1   3)Diabetes.  -A1c 6.1 reflecting excellent DM control PTA, continue Metformin    4)hypothyroidism.  Continue on Synthroid.   5)GERD--EGD with possible early GAVE, Continue on  PPI--follow-up with GI service for biopsy results  6)Hypertension.  Continued on losartan and amlodipine.  7)History of gout.  Continue allopurinol  8)Hyperlipidemia.  Continue simvastatin.  Code Status: Full code Family Communication:updated patient's son at the bedside Disposition Plan:  Discharge home   Dispo: The patient is from: Home  Anticipated d/c is to: Home   Consultants:   Gastroenterology  Procedures:  Colonoscopy: - Three 4 to 6 mm polyps in the sigmoid colon, in the descending colon and at the hepatic flexure, removed with a cold snare. Resected and retrieved.-- - Diverticulosis in the sigmoid colon, in the descending colon and in the transverse colon. Colonic lipoma. -- The distal rectum and anal verge are normal on retroflexion view. Both a noncompliant andredundant colon made the exam more challenging.  EGD: - Normal esophagus. Mildly abnormal antrum. Query early GAVE. Appeared innocent. Status post biopsy - Normal duodenal bulb and second portion of the duodenum  Discharge Condition: stable  Follow UP  Follow-up Information    Loman Brooklyn, FNP. Schedule an appointment as soon as possible for a visit in 4 day(s).   Specialty: Family Medicine Why: Patient needs CBC blood test every Thursday starting 12/12/2019 for the next 3 weeks Contact information: [address] [place] [address] [phone]        Daneil Dolin, MD. Schedule an appointment as soon as possible for a visit in 2 week(s).   Specialty: Gastroenterology Why: Patient may need outpatient capsule endoscopy and IV  infusion Contact information: [place] [address] [phone]        [name], NP. Schedule an appointment as soon as possible for a visit in 2 week(s).   Specialty: Gastroenterology Why: Follow-up to discuss possible capsule endoscopy Contact information: [address] [place] [address] [phone]           Diet and Activity recommendation:  As advised  Discharge Instructions    Discharge Instructions    Call MD for:  difficulty breathing, headache or visual disturbances   Complete by: As directed    Call MD for:  extreme fatigue   Complete by: As directed    Call MD for:  persistant dizziness or light-headedness   Complete by: As directed    Call MD for:  persistant nausea and vomiting   Complete by: As directed    Call MD for:  severe uncontrolled pain   Complete by: As directed    Call MD for:  temperature >100.4   Complete by: As directed    Diet - low sodium heart healthy   Complete by: As directed    Discharge instructions   Complete by: As directed    1)You are taking apixaban/Eliquis which is a blood thinner so Avoid ibuprofen/Advil/Aleve/Motrin/Goody Powders/Naproxen/BC powders/Meloxicam/Diclofenac/Indomethacin and other Nonsteroidal anti-inflammatory medications as these will make you more likely to bleed and can cause stomach ulcers, can also cause Kidney problems.  2)Please call if dark stools, bloody urine or blood in your stool or nosebleed or any concerns about bleeding 3) follow-up with your primary care physician for repeat CBC/complete blood count test every Thursday for the Next 3 weeks starting 12/12/19----  4)Dr. Gala Romney in a couple weeks--- You May need capsule endoscopy and outpatient IV infusion of iron   Increase activity slowly   Complete by: As directed        Discharge Medications     Allergies as of 12/09/2019   No Known Allergies     Medication List    TAKE these medications   acetaminophen 325 MG tablet Commonly known as: TYLENOL Take 2 tablets (650 mg total) by mouth every 6 (six) hours as needed for mild pain or headache (or Fever >/= 101).   allopurinol 100 MG tablet Commonly known as: ZYLOPRIM Take 1 tablet (100 mg total) by mouth daily.   amLODipine 5 MG tablet Commonly  known as: NORVASC Take 1 tablet (5 mg total) by mouth daily.   apixaban 5 MG Tabs tablet Commonly known as: Eliquis Take 1 tablet (5 mg total) by mouth 2 (two) times daily.   diclofenac sodium 1 % Gel Commonly known as: VOLTAREN Apply 4 g topically 4 (four) times daily.   furosemide 20 MG tablet Commonly known as: LASIX Take 1 tablet (20 mg total) by mouth 2 (two) times daily.   levothyroxine 25 MCG tablet Commonly known as: SYNTHROID Take 1 tablet (25 mcg total) by mouth daily before breakfast.  loratadine 10 MG tablet Commonly known as: CLARITIN Take 10 mg by mouth daily.   losartan 100 MG tablet Commonly known as: COZAAR Take 1 tablet (100 mg total) by mouth every morning.   metFORMIN 500 MG tablet Commonly known as: GLUCOPHAGE Take 1 tablet (500 mg total) by mouth 2 (two) times daily with a meal. Give w/food.   ONE TOUCH ULTRA TEST test strip Generic drug: glucose blood USE UP TO FOUR TIMES DAILY AS DIRECTED   oxybutynin 5 MG tablet Commonly known as: DITROPAN Take 1 tablet (5 mg total) by mouth 3 (three) times daily. Needs to be seen for further refills   pantoprazole 40 MG tablet Commonly known as: PROTONIX Take 1 tablet (40 mg total) by mouth 2 (two) times daily before a meal. What changed: when to take this   simvastatin 40 MG tablet Commonly known as: ZOCOR Take 1 tablet (40 mg total) by mouth every evening. Needs to be seen for further refills. What changed: when to take this   Vitamin D-3 125 MCG (5000 UT) Tabs Take 5,000 tablets by mouth daily.      Major procedures and Radiology Reports - PLEASE review detailed and final reports for all details, in brief -   No results found.  Micro Results   Recent Results (from the past 240 hour(s))  SARS Coronavirus 2 by RT PCR (hospital order, performed in [facility] hospital lab) Nasopharyngeal Nasopharyngeal Swab     Status: None   Collection Time: 12/06/19  3:54 PM   Specimen: Nasopharyngeal Swab    Result Value Ref Range Status   SARS Coronavirus 2 NEGATIVE NEGATIVE Final    Comment: (NOTE) SARS-CoV-2 target nucleic acids are NOT DETECTED. The SARS-CoV-2 RNA is generally detectable in upper and lower respiratory specimens during the acute phase of infection. The lowest concentration of SARS-CoV-2 viral copies this assay can detect is 250 copies / mL. A negative result does not preclude SARS-CoV-2 infection and should not be used as the sole basis for treatment or other patient management decisions.  A negative result may occur with improper specimen collection / handling, submission of specimen other than nasopharyngeal swab, presence of viral mutation(s) within the areas targeted by this assay, and inadequate number of viral copies (<250 copies / mL). A negative result must be combined with clinical observations, patient history, and epidemiological information. Fact Sheet for Patients:   StrictlyIdeas.no Fact Sheet for Healthcare Providers: BankingDealers.co.za This test is not yet approved or cleared  by the [place] FDA and has been authorized for detection and/or diagnosis of SARS-CoV-2 by FDA under an Emergency Use Authorization (EUA).  This EUA will remain in effect (meaning this test can be used) for the duration of the COVID-19 declaration under Section 564(b)(1) of the Act, 21 U.S.C. section 360bbb-3(b)(1), unless the authorization is terminated or revoked sooner. Performed at [facility], [address], [place], [place] [address]    Today   Subjective    [name] [name] today has no new concerns Son at bedside, questions answered Ambulating in hallways w/o DOE or CP or dizziness        Patient has been seen and examined prior to discharge   Objective   Blood pressure (!) 145/68, pulse 63, temperature 99.9 F (37.7 C), temperature source Oral, resp. rate 18, SpO2 94 %.   Intake/Output Summary (Last 24  hours) at 12/09/2019 1416 Last data filed at 12/09/2019 1300 Gross per 24 hour  Intake 600 ml  Output --  Net 600 ml    Exam Gen:- Awake Alert, no acute distress  HEENT:- [place].AT, No sclera icterus Neck-Supple Neck,No JVD,.  Lungs-  CTAB , good air movement bilaterally  CV- S1, S2 normal, regular Abd-  +ve B.Sounds, Abd Soft, No tenderness,    Extremity/Skin:- No  edema,   good pulses Psych-affect is appropriate, oriented x3 Neuro-no new focal deficits, no tremors    Data Review   CBC w Diff:  Lab Results  Component Value Date   WBC 8.5 12/09/2019   HGB 8.0 (L) 12/09/2019   HGB 6.9 (LL) 12/03/2019   HCT 28.2 (L) 12/09/2019   HCT 25.3 (L) 12/03/2019   PLT 293 12/09/2019   PLT 387 12/03/2019   LYMPHOPCT 31 12/06/2019   BANDSPCT PENDING 01/24/2011   MONOPCT 13 12/06/2019   EOSPCT 2 12/06/2019   BASOPCT 0 12/06/2019    CMP:  Lab Results  Component Value Date   NA 141 12/07/2019   NA 141 12/03/2019   K 3.7 12/07/2019   CL 108 12/07/2019   CO2 26 12/07/2019   BUN 14 12/07/2019   BUN 20 12/03/2019   CREATININE 0.68 12/07/2019   PROT 6.1 (L) 12/07/2019   PROT 6.5 12/03/2019   ALBUMIN 3.6 12/07/2019   ALBUMIN 4.5 12/03/2019   BILITOT 0.7 12/07/2019   BILITOT 0.3 12/03/2019   ALKPHOS 60 12/07/2019   AST 15 12/07/2019   ALT 16 12/07/2019  .   Total Discharge time is about 33 minutes  Roxan Hockey M.D on 12/09/2019 at 2:16 PM  Go to [email or web address] -  for contact info  [facility] - Office  [phone]

## 2019-12-09 NOTE — Telephone Encounter (Signed)
TRANSITIONAL CARE MANAGEMENT TELEPHONE OUTREACH NOTE   Contact Date: 12/09/2019 Contacted By: Zannie Cove,    DISCHARGE INFORMATION Date of Discharge:12/09/19  Discharge Facility: [facility]    Outpatient Follow Up Recommendations (copied from discharge summary) Follow-up Information    Loman Brooklyn, FNP. Schedule an appointment as soon as possible for a visit in 4 day(s).   Specialty: Family Medicine Why: Patient needs CBC blood test every Thursday starting 12/12/2019 for the next 3 weeks Contact information: [address] [place] [address] [phone]        Daneil Dolin, MD. Schedule an appointment as soon as possible for a visit in 2 week(s).   Specialty: Gastroenterology Why: Patient may need outpatient capsule endoscopy and IV  infusion Contact information: [place] [address] [phone]        [name], NP. Schedule an appointment as soon as possible for a visit in 2 week(s).   Specialty: Gastroenterology Why: Follow-up to discuss possible capsule endoscopy Contact information: [place] [address] [phone]     [name] [name] is a female primary care patient of Loman Brooklyn, FNP. An outgoing telephone call was made today and I spoke with patient.  Ms. Barbe condition(s) and treatment(s) were discussed. An opportunity to ask questions was provided and all were answered or forwarded as appropriate.    ACTIVITIES OF DAILY LIVING  Tamishia Dante lives with their son and she can perform ADLs independently. her primary caregiver is herself. she is able to depend on her primary caregiver(s) for consistent help. Transportation to appointments, to pick up medications, and to run errands is not a problem.  (Consider referral to [facility] if transportation or a consistent caregiver is a problem)   Fall Risk Fall Risk  12/03/2019 11/04/2019  Falls in the  past year? 0 0  Number falls in past yr: - 0  Injury with Fall? - 0  Risk for fall due to : - Impaired mobility  Risk for fall due to: Comment - knee pain/weakness  Follow up - Falls prevention discussed    medium [place] Modifications/Assistive Devices Wheelchair: No Cane: No Ramp: Yes Bedside Toilet: Yes Hospital Bed:  No Other: walker - yes   [place] she is not receiving home health n/a  services.     MEDICATION RECONCILIATION  Ms. Detty has been able to pick-up all prescribed discharge medications from the pharmacy.   A post discharge medication reconciliation was performed and the complete medication list was reviewed with the patient/caregiver and is current as of 12/09/2019. Changes highlighted below.  Discontinued Medications none  Current Medication List Allergies as of 12/09/2019   No Known Allergies     Medication List       Accurate as of Dec 09, 2019  5:01 PM. If you have any questions, ask your nurse or doctor.        acetaminophen 325 MG tablet Commonly known as: TYLENOL Take 2 tablets (650 mg total) by mouth every 6 (six) hours as needed for mild pain or headache (or Fever >/= 101).   allopurinol 100 MG tablet Commonly known as: ZYLOPRIM Take 1 tablet (100 mg total) by mouth daily.   amLODipine 5 MG tablet Commonly known as: NORVASC Take 1 tablet (5 mg total) by mouth daily.   apixaban 5 MG Tabs tablet Commonly known as: Eliquis Take 1 tablet (5 mg total)  by mouth 2 (two) times daily.   diclofenac sodium 1 % Gel Commonly known as: VOLTAREN Apply 4 g topically 4 (four) times daily.   furosemide 20 MG tablet Commonly known as: LASIX Take 1 tablet (20 mg total) by mouth 2 (two) times daily.   levothyroxine 25 MCG tablet Commonly known as: SYNTHROID Take 1 tablet (25 mcg total) by mouth daily before breakfast.   loratadine 10 MG tablet Commonly known as: CLARITIN Take 10 mg by mouth daily.   losartan 100 MG  tablet Commonly known as: COZAAR Take 1 tablet (100 mg total) by mouth every morning.   metFORMIN 500 MG tablet Commonly known as: GLUCOPHAGE Take 1 tablet (500 mg total) by mouth 2 (two) times daily with a meal. Give w/food.   ONE TOUCH ULTRA TEST test strip Generic drug: glucose blood USE UP TO FOUR TIMES DAILY AS DIRECTED   oxybutynin 5 MG tablet Commonly known as: DITROPAN Take 1 tablet (5 mg total) by mouth 3 (three) times daily. Needs to be seen for further refills   pantoprazole 40 MG tablet Commonly known as: PROTONIX Take 1 tablet (40 mg total) by mouth 2 (two) times daily before a meal.   simvastatin 40 MG tablet Commonly known as: ZOCOR Take 1 tablet (40 mg total) by mouth every evening. Needs to be seen for further refills.   Vitamin D-3 125 MCG (5000 UT) Tabs Take 5,000 tablets by mouth daily.        PATIENT EDUCATION & FOLLOW-UP PLAN  An appointment for Transitional Care Management is scheduled with Loman Brooklyn, FNP on 12/11/19 at 305pm.  Take all medications as prescribed  Contact our office by calling [phone] if you have any questions or concerns

## 2019-12-09 NOTE — Telephone Encounter (Signed)
Alicia: please have patient repeat CBC on Wednesday/Thursday as outpatient.   Stacey: needs office visit in 2 weeks with myself (I've seen patient while admitted) if possible. If no openings with me then with next available APP due to need for close hospital follow-up.

## 2019-12-09 NOTE — Progress Notes (Signed)
Nsg Discharge Note  Admit Date:  12/06/2019 Discharge date: 12/09/2019   Lalla Brothers to be D/C'd Home per MD order.  AVS completed.  Copy for chart, and copy for patient signed, and dated. Patient/caregiver able to verbalize understanding.  Discharge Medication: Allergies as of 12/09/2019   No Known Allergies     Medication List    TAKE these medications   acetaminophen 325 MG tablet Commonly known as: TYLENOL Take 2 tablets (650 mg total) by mouth every 6 (six) hours as needed for mild pain or headache (or Fever >/= 101).   allopurinol 100 MG tablet Commonly known as: ZYLOPRIM Take 1 tablet (100 mg total) by mouth daily.   amLODipine 5 MG tablet Commonly known as: NORVASC Take 1 tablet (5 mg total) by mouth daily.   apixaban 5 MG Tabs tablet Commonly known as: Eliquis Take 1 tablet (5 mg total) by mouth 2 (two) times daily.   diclofenac sodium 1 % Gel Commonly known as: VOLTAREN Apply 4 g topically 4 (four) times daily.   furosemide 20 MG tablet Commonly known as: LASIX Take 1 tablet (20 mg total) by mouth 2 (two) times daily.   levothyroxine 25 MCG tablet Commonly known as: SYNTHROID Take 1 tablet (25 mcg total) by mouth daily before breakfast.   loratadine 10 MG tablet Commonly known as: CLARITIN Take 10 mg by mouth daily.   losartan 100 MG tablet Commonly known as: COZAAR Take 1 tablet (100 mg total) by mouth every morning.   metFORMIN 500 MG tablet Commonly known as: GLUCOPHAGE Take 1 tablet (500 mg total) by mouth 2 (two) times daily with a meal. Give w/food.   ONE TOUCH ULTRA TEST test strip Generic drug: glucose blood USE UP TO FOUR TIMES DAILY AS DIRECTED   oxybutynin 5 MG tablet Commonly known as: DITROPAN Take 1 tablet (5 mg total) by mouth 3 (three) times daily. Needs to be seen for further refills   pantoprazole 40 MG tablet Commonly known as: PROTONIX Take 1 tablet (40 mg total) by mouth 2 (two) times daily before a meal. What changed:  when to take this   simvastatin 40 MG tablet Commonly known as: ZOCOR Take 1 tablet (40 mg total) by mouth every evening. Needs to be seen for further refills. What changed: when to take this   Vitamin D-3 125 MCG (5000 UT) Tabs Take 5,000 tablets by mouth daily.       Discharge Assessment: Vitals:   12/09/19 0006 12/09/19 0800  BP:  (!) 145/68  Pulse:    Resp:    Temp:    SpO2: 94%    Skin clean, dry and intact without evidence of skin break down, no evidence of skin tears noted. IV catheter discontinued intact. Site without signs and symptoms of complications - no redness or edema noted at insertion site, patient denies c/o pain - only slight tenderness at site.  Dressing with slight pressure applied.  D/c Instructions-Education: Discharge instructions given to patient/family with verbalized understanding. D/c education completed with patient/family including follow up instructions, medication list, d/c activities limitations if indicated, with other d/c instructions as indicated by MD - patient able to verbalize understanding, all questions fully answered. Patient instructed to return to ED, call 911, or call MD for any changes in condition.  Patient escorted via [place], and D/C home via private auto.  Zenaida Deed, RN 12/09/2019 2:39 PM

## 2019-12-10 ENCOUNTER — Encounter: Payer: Self-pay | Admitting: Internal Medicine

## 2019-12-10 ENCOUNTER — Other Ambulatory Visit: Payer: Self-pay

## 2019-12-10 ENCOUNTER — Telehealth: Payer: Self-pay

## 2019-12-10 LAB — SURGICAL PATHOLOGY

## 2019-12-10 NOTE — Telephone Encounter (Signed)
OV made °

## 2019-12-10 NOTE — Telephone Encounter (Signed)
Per RMR- Send letter to patient.  Send copy of letter with path to referring provider and PCP.  She needs office follow-up with app in the next few weeks to reassess her GI bleed/IDA. ? See if further evaluation needed.

## 2019-12-11 ENCOUNTER — Encounter: Payer: Self-pay | Admitting: Family Medicine

## 2019-12-11 ENCOUNTER — Ambulatory Visit (INDEPENDENT_AMBULATORY_CARE_PROVIDER_SITE_OTHER): Payer: Medicare Other | Admitting: Family Medicine

## 2019-12-11 ENCOUNTER — Other Ambulatory Visit: Payer: Self-pay

## 2019-12-11 VITALS — BP 172/83 | HR 86 | Temp 98.0°F | Ht 63.0 in | Wt 212.6 lb

## 2019-12-11 DIAGNOSIS — D649 Anemia, unspecified: Secondary | ICD-10-CM

## 2019-12-11 DIAGNOSIS — E039 Hypothyroidism, unspecified: Secondary | ICD-10-CM

## 2019-12-11 DIAGNOSIS — E119 Type 2 diabetes mellitus without complications: Secondary | ICD-10-CM

## 2019-12-11 DIAGNOSIS — E1169 Type 2 diabetes mellitus with other specified complication: Secondary | ICD-10-CM | POA: Diagnosis not present

## 2019-12-11 DIAGNOSIS — I10 Essential (primary) hypertension: Secondary | ICD-10-CM

## 2019-12-11 DIAGNOSIS — E785 Hyperlipidemia, unspecified: Secondary | ICD-10-CM

## 2019-12-11 DIAGNOSIS — M109 Gout, unspecified: Secondary | ICD-10-CM | POA: Diagnosis not present

## 2019-12-11 DIAGNOSIS — K219 Gastro-esophageal reflux disease without esophagitis: Secondary | ICD-10-CM

## 2019-12-11 MED ORDER — METHYLPREDNISOLONE ACETATE 80 MG/ML IJ SUSP
80.0000 mg | Freq: Once | INTRAMUSCULAR | Status: AC
  Administered 2019-12-11: 80 mg via INTRAMUSCULAR

## 2019-12-11 MED ORDER — PANTOPRAZOLE SODIUM 40 MG PO TBEC: 40.0000 mg | DELAYED_RELEASE_TABLET | Freq: Two times a day (BID) | ORAL | 1 refills | Status: DC

## 2019-12-11 MED ORDER — METFORMIN HCL 500 MG PO TABS: 500.0000 mg | ORAL_TABLET | Freq: Two times a day (BID) | ORAL | 1 refills | Status: DC

## 2019-12-11 MED ORDER — KETOROLAC TROMETHAMINE 60 MG/2ML IM SOLN
60.0000 mg | Freq: Once | INTRAMUSCULAR | Status: AC
Start: 2019-12-11 — End: 2019-12-11
  Administered 2019-12-11: 60 mg via INTRAMUSCULAR

## 2019-12-11 MED ORDER — SIMVASTATIN 40 MG PO TABS: 40.0000 mg | ORAL_TABLET | Freq: Every evening | ORAL | 1 refills | Status: DC

## 2019-12-11 MED ORDER — COLCHICINE 0.6 MG PO CAPS: ORAL_CAPSULE | ORAL | 2 refills | Status: DC

## 2019-12-11 MED ORDER — AMLODIPINE BESYLATE 5 MG PO TABS: 5.0000 mg | ORAL_TABLET | Freq: Every day | ORAL | 1 refills | Status: DC

## 2019-12-11 MED ORDER — FUROSEMIDE 20 MG PO TABS: 20.0000 mg | ORAL_TABLET | Freq: Two times a day (BID) | ORAL | 1 refills | Status: DC

## 2019-12-11 MED ORDER — LEVOTHYROXINE SODIUM 25 MCG PO TABS: 25.0000 ug | ORAL_TABLET | Freq: Every day | ORAL | 1 refills | Status: DC

## 2019-12-11 NOTE — Progress Notes (Signed)
Assessment & Plan:  1. Symptomatic anemia - Patient is feeling better. Keep GI appointment in 3 weeks. Return weekly for CBC until appointment.  - CBC with Differential/Platelet; Standing - CBC with Differential/Platelet  2. Acute gout of left ankle, unspecified cause - Treated with Depo-Medrol and Toradol injections today. Colchicine prescribed for future gout attacks. - Colchicine 0.6 MG CAPS; Take 1.2 mg (2 capsules) by mouth x1, then 0.6 mg (1 capsule) 1 hour later x1 as needed for gout flare.  Dispense: 9 capsule; Refill: 2 - methylPREDNISolone acetate (DEPO-MEDROL) injection 80 mg - ketorolac (TORADOL) injection 60 mg    Follow up plan: Return As scheduled.  [name], MSN, APRN, FNP-C [facility]  Subjective:   Patient ID: [name] [name], female    DOB: 08-20-1940, 79 y.o.   MRN: [id number]  HPI: [name] [name] is a 79 y.o. female presenting on 12/11/2019 for Transitions Of Care (anemia- 12/06/19 AP) and Joint Swelling (Left ankle x 2 days)  Patient was admitted to [facility] 12/06/2019 to 12/09/2019 due to symptomatic anemia.  Patient was transfused with 2 units of PRBC and seen by gastroenterology.  She did undergo a colonoscopy and EGD.  GI recommended restarting Eliquis, weekly CBCs, and follow-up with them in a couple of weeks for a recheck and possible capsule endoscopy if H/H continues to drift down and pathology/biopsies are unrevealing.  Patient may need outpatient IV iron infusion.  Patient reports today she is feeling better than she was when she last saw me in the office.  She does have an appointment scheduled with GI on 01/03/2020.  Her concern today is that she is having a gout flare in her left ankle.  This is the first flare she has experienced since being on allopurinol.   ROS: Negative unless specifically indicated above in HPI.   Relevant past medical history reviewed and updated as indicated.   Allergies and  medications reviewed and updated.   Current Outpatient Medications:  .  acetaminophen (TYLENOL) 325 MG tablet, Take 2 tablets (650 mg total) by mouth every 6 (six) hours as needed for mild pain or headache (or Fever >/= 101)., Disp: 12 tablet, Rfl: 0 .  allopurinol (ZYLOPRIM) 100 MG tablet, Take 1 tablet (100 mg total) by mouth daily., Disp: 90 tablet, Rfl: 1 .  amLODipine (NORVASC) 5 MG tablet, Take 1 tablet (5 mg total) by mouth daily., Disp: 90 tablet, Rfl: 1 .  apixaban (ELIQUIS) 5 MG TABS tablet, Take 1 tablet (5 mg total) by mouth 2 (two) times daily., Disp: 60 tablet, Rfl: 5 .  Cholecalciferol (VITAMIN D-3) 5000 UNITS TABS, Take 5,000 tablets by mouth daily., Disp: , Rfl:  .  diclofenac sodium (VOLTAREN) 1 % GEL, Apply 4 g topically 4 (four) times daily., Disp: 200 g, Rfl: 11 .  furosemide (LASIX) 20 MG tablet, Take 1 tablet (20 mg total) by mouth 2 (two) times daily., Disp: 180 tablet, Rfl: 1 .  levothyroxine (SYNTHROID) 25 MCG tablet, Take 1 tablet (25 mcg total) by mouth daily before breakfast., Disp: 90 tablet, Rfl: 1 .  loratadine (CLARITIN) 10 MG tablet, Take 10 mg by mouth daily., Disp: , Rfl:  .  losartan (COZAAR) 100 MG tablet, Take 1 tablet (100 mg total) by mouth every morning., Disp: 90 tablet, Rfl: 1 .  metFORMIN (GLUCOPHAGE) 500 MG tablet, Take 1 tablet (500 mg total) by mouth 2 (two) times daily with a meal. Give w/food., Disp: 180 tablet, Rfl: 1 .  ONE TOUCH ULTRA TEST test strip, USE UP TO FOUR TIMES DAILY AS DIRECTED, Disp: 400 each, Rfl: 3 .  oxybutynin (DITROPAN) 5 MG tablet, Take 1 tablet (5 mg total) by mouth 3 (three) times daily. Needs to be seen for further refills, Disp: 270 tablet, Rfl: 0 .  pantoprazole (PROTONIX) 40 MG tablet, Take 1 tablet (40 mg total) by mouth 2 (two) times daily before a meal., Disp: 180 tablet, Rfl: 1 .  simvastatin (ZOCOR) 40 MG tablet, Take 1 tablet (40 mg total) by mouth every evening. Needs to be seen for further refills., Disp: 90  tablet, Rfl: 1 .  Colchicine 0.6 MG CAPS, Take 1.2 mg (2 capsules) by mouth x1, then 0.6 mg (1 capsule) 1 hour later x1 as needed for gout flare., Disp: 9 capsule, Rfl: 2  No Known Allergies  Objective:   BP (!) 172/83   Pulse 86   Temp 98 F (36.7 C) (Temporal)   Ht 5\' 3"  (1.6 m)   Wt 212 lb 9.6 oz (96.4 kg)   SpO2 99%   BMI 37.66 kg/m    Physical Exam Vitals reviewed.  Constitutional:      General: She is not in acute distress.    Appearance: Normal appearance. She is obese. She is not ill-appearing, toxic-appearing or diaphoretic.  HENT:     Head: Normocephalic and atraumatic.  Eyes:     General: No scleral icterus.       Right eye: No discharge.        Left eye: No discharge.     Conjunctiva/sclera: Conjunctivae normal.  Cardiovascular:     Rate and Rhythm: Normal rate and regular rhythm.     Heart sounds: Normal heart sounds. No murmur. No friction rub. No gallop.   Pulmonary:     Effort: Pulmonary effort is normal. No respiratory distress.     Breath sounds: Normal breath sounds. No stridor. No wheezing, rhonchi or rales.  Musculoskeletal:        General: Normal range of motion.     Cervical back: Normal range of motion.     Left ankle: Swelling (and erythema) present.  Skin:    General: Skin is warm and dry.     Capillary Refill: Capillary refill takes less than 2 seconds.  Neurological:     General: No focal deficit present.     Mental Status: She is alert and oriented to person, place, and time. Mental status is at baseline.  Psychiatric:        Mood and Affect: Mood normal.        Behavior: Behavior normal.        Thought Content: Thought content normal.        Judgment: Judgment normal.

## 2019-12-12 LAB — CBC WITH DIFFERENTIAL/PLATELET
Basophils Absolute: 0 10*3/uL (ref 0.0–0.2)
Basos: 0 %
EOS (ABSOLUTE): 0.2 10*3/uL (ref 0.0–0.4)
Eos: 2 %
Hematocrit: 30.5 % — ABNORMAL LOW (ref 34.0–46.6)
Hemoglobin: 8.6 g/dL — CL (ref 11.1–15.9)
Immature Grans (Abs): 0.1 10*3/uL (ref 0.0–0.1)
Immature Granulocytes: 1 %
Lymphocytes Absolute: 2.2 10*3/uL (ref 0.7–3.1)
Lymphs: 23 %
MCH: 20.6 pg — ABNORMAL LOW (ref 26.6–33.0)
MCHC: 28.2 g/dL — ABNORMAL LOW (ref 31.5–35.7)
MCV: 73 fL — ABNORMAL LOW (ref 79–97)
Monocytes Absolute: 1.2 10*3/uL — ABNORMAL HIGH (ref 0.1–0.9)
Monocytes: 13 %
Neutrophils Absolute: 5.8 10*3/uL (ref 1.4–7.0)
Neutrophils: 61 %
Platelets: 297 10*3/uL (ref 150–450)
RBC: 4.18 x10E6/uL (ref 3.77–5.28)
RDW: 19.8 % — ABNORMAL HIGH (ref 11.7–15.4)
WBC: 9.4 10*3/uL (ref 3.4–10.8)

## 2019-12-14 ENCOUNTER — Encounter: Payer: Self-pay | Admitting: Family Medicine

## 2019-12-14 NOTE — Addendum Note (Signed)
Addended by: [name] F on: 12/14/2019 12:18 PM   Modules accepted: Level of Service

## 2019-12-19 ENCOUNTER — Other Ambulatory Visit: Payer: Self-pay

## 2019-12-19 ENCOUNTER — Other Ambulatory Visit: Payer: Medicare Other

## 2019-12-19 DIAGNOSIS — D649 Anemia, unspecified: Secondary | ICD-10-CM

## 2019-12-20 LAB — CBC WITH DIFFERENTIAL/PLATELET
Basophils Absolute: 0 10*3/uL (ref 0.0–0.2)
Basos: 1 %
EOS (ABSOLUTE): 0.1 10*3/uL (ref 0.0–0.4)
Eos: 2 %
Hematocrit: 29.6 % — ABNORMAL LOW (ref 34.0–46.6)
Hemoglobin: 8.3 g/dL — CL (ref 11.1–15.9)
Immature Grans (Abs): 0 10*3/uL (ref 0.0–0.1)
Immature Granulocytes: 0 %
Lymphocytes Absolute: 2.2 10*3/uL (ref 0.7–3.1)
Lymphs: 32 %
MCH: 20 pg — ABNORMAL LOW (ref 26.6–33.0)
MCHC: 28 g/dL — ABNORMAL LOW (ref 31.5–35.7)
MCV: 71 fL — ABNORMAL LOW (ref 79–97)
Monocytes Absolute: 0.8 10*3/uL (ref 0.1–0.9)
Monocytes: 11 %
Neutrophils Absolute: 3.7 10*3/uL (ref 1.4–7.0)
Neutrophils: 54 %
Platelets: 386 10*3/uL (ref 150–450)
RBC: 4.15 x10E6/uL (ref 3.77–5.28)
RDW: 19.9 % — ABNORMAL HIGH (ref 11.7–15.4)
WBC: 6.9 10*3/uL (ref 3.4–10.8)

## 2019-12-24 ENCOUNTER — Ambulatory Visit (INDEPENDENT_AMBULATORY_CARE_PROVIDER_SITE_OTHER): Payer: Medicare Other | Admitting: *Deleted

## 2019-12-24 DIAGNOSIS — M159 Polyosteoarthritis, unspecified: Secondary | ICD-10-CM

## 2019-12-24 DIAGNOSIS — D649 Anemia, unspecified: Secondary | ICD-10-CM

## 2019-12-25 NOTE — Patient Instructions (Signed)
RN Care Plan   . "I need to find out why I'm anemic" (pt-stated)        CARE PLAN ENTRY (see longitudinal plan of care for additional care plan information)  Current Barriers:  . Chronic Disease Management support and education needs related to anemia . On Eliquis for Afib . Neg colonoscopy and endosocpy  Nurse Case Manager Clinical Goal(s):  Marland Kitchen Over the next 30 days, patient will work with gastroenterologist to address needs related to anemia . Over the next 30 days, patient will reach out to [place] as needed to assist with care coordination  Interventions:  . Inter-disciplinary care team collaboration (see longitudinal plan of care) . Chart reviewed, including recent office and hospitalization notes and lab results o Hospitalized 5/14-5/17 o F/u visit with PCP on 5/19 . Discussed all recent hemoglobin results and trend . Discussed colonoscopy and endoscopy results . Discussed plan to f/u with gastroenterologist  o Scheduled for 01/03/20 . Discussed plan to have repeat lab work on 12/26/19 at [facility] to f/u on Hgb level . Reviewed and discussed medications . Talked about how she feels overall now that her Hgb has improved some o "I feel pretty good. Better than I did". . Provided with RNCM contact information and encouraged to reach out as needed  Patient Self Care Activities:  . Performs ADL's independently . Performs IADL's independently  Initial goal documentation     . "I want to control my knee pain"       CARE PLAN ENTRY (see longtitudinal plan of care for additional care plan information)  Current Barriers:  . Chronic Disease Management support and education needs related to osteoarthritis resulting in chronic knee pain  Nurse Case Manager Clinical Goal(s):  Marland Kitchen Over the next 60 days, patient will continue to work with Dr Alvan Dame with [facility] to address needs related to osteoarthritis and chronic knee pain . Over the next 30 days, patient will talk with RN Care  Manager regarding osteoarthritis pain management  Interventions:  . Inter-disciplinary care team collaboration (see longtitudinal plan of care) . Chart reviewed, including office notes, imaging reports, lab results, and referrals o Referred to Dr Alvan Dame in 04/2019 but did not schedule an appointment . Encouraged patient to reach out to the scheduling department at [facility] at [phone] . Reviewed and discussed medications: Diclofenac gel and acetaminophen o Patient is taking Eliquis and has to avoid oral NSAIDs. Recent hospitalization for anemia with unknown cause. Patient concerned that topical NSAID may also be a problem o Reached out to [facility] PharmD to verify that diclofenac gel is safe to use in a patient with anemia that is on eliquis. Awaiting reply.  . Discussed joint pain and affect on ADLs . Provided with CCM contact information. Encouraged to reach out as needed.  Patient Self Care Activities:  . Performs ADL's independently . Performs IADL's independently  Please see past updates related to this goal by clicking on the "Past Updates" button in the selected goal          Plan:   The care management team will reach out to the patient again over the next 30 days.    Follow-up with GI on 01/03/20  Rck hemoglobin on 12/26/19   Chong Sicilian, BSN, RN-BC Embedded [place] [facility] [place] Management Direct Dial: [phone]

## 2019-12-25 NOTE — Chronic Care Management (AMB) (Signed)
Chronic Care Management   Follow Up Note   12/24/2019 Name: [name] [name] MRN: [id number] DOB: [birth year]  Referred by: Loman Brooklyn, FNP Reason for referral : Chronic Care Management (RN follow up)   [name] [name] is a 79 y.o. year old female who is a primary care patient of Loman Brooklyn, FNP. The CCM team was consulted for assistance with chronic disease management and care coordination needs.    Review of patient status, including review of consultants reports, relevant laboratory and other test results, and collaboration with appropriate care team members and the patient's provider was performed as part of comprehensive patient evaluation and provision of chronic care management services.    Subjective: I spoke with [name] [name] by telephone today regarding management of her chronic medical conditions. "I feel pretty good; Better than I did".   SDOH (Social Determinants of Health) assessments performed: No See Care Plan activities for detailed interventions related to [facility])     Outpatient Encounter Medications as of 12/24/2019  Medication Sig  . acetaminophen (TYLENOL) 325 MG tablet Take 2 tablets (650 mg total) by mouth every 6 (six) hours as needed for mild pain or headache (or Fever >/= 101).  Marland Kitchen allopurinol (ZYLOPRIM) 100 MG tablet Take 1 tablet (100 mg total) by mouth daily.  Marland Kitchen amLODipine (NORVASC) 5 MG tablet Take 1 tablet (5 mg total) by mouth daily.  Marland Kitchen apixaban (ELIQUIS) 5 MG TABS tablet Take 1 tablet (5 mg total) by mouth 2 (two) times daily.  . Cholecalciferol (VITAMIN D-3) 5000 UNITS TABS Take 5,000 tablets by mouth daily.  . Colchicine 0.6 MG CAPS Take 1.2 mg (2 capsules) by mouth x1, then 0.6 mg (1 capsule) 1 hour later x1 as needed for gout flare.  . diclofenac sodium (VOLTAREN) 1 % GEL Apply 4 g topically 4 (four) times daily.  . furosemide (LASIX) 20 MG tablet Take 1 tablet (20 mg total) by mouth 2 (two) times daily.  Marland Kitchen levothyroxine (SYNTHROID) 25 MCG  tablet Take 1 tablet (25 mcg total) by mouth daily before breakfast.  . loratadine (CLARITIN) 10 MG tablet Take 10 mg by mouth daily.  [name] 100 MG tablet Take 1 tablet (100 mg total) by mouth every morning.  . metFORMIN (GLUCOPHAGE) 500 MG tablet Take 1 tablet (500 mg total) by mouth 2 (two) times daily with a meal. Give w/food.  . ONE TOUCH ULTRA TEST test strip USE UP TO FOUR TIMES DAILY AS DIRECTED  . oxybutynin (DITROPAN) 5 MG tablet Take 1 tablet (5 mg total) by mouth 3 (three) times daily. Needs to be seen for further refills  . pantoprazole (PROTONIX) 40 MG tablet Take 1 tablet (40 mg total) by mouth 2 (two) times daily before a meal.  . simvastatin (ZOCOR) 40 MG tablet Take 1 tablet (40 mg total) by mouth every evening. Needs to be seen for further refills.   No facility-administered encounter medications on file as of 12/24/2019.    Lab Results  Component Value Date   HGB 8.3 (LL) 12/19/2019    RN Care Plan   . "I need to find out why I'm anemic" (pt-stated)        CARE PLAN ENTRY (see longitudinal plan of care for additional care plan information)  Current Barriers:  . Chronic Disease Management support and education needs related to anemia . On Eliquis for Afib . Neg colonoscopy and endosocpy  Nurse Case Manager Clinical Goal(s):  Marland Kitchen Over the next 30 days,  patient will work with gastroenterologist to address needs related to anemia . Over the next 30 days, patient will reach out to [place] as needed to assist with care coordination  Interventions:  . Inter-disciplinary care team collaboration (see longitudinal plan of care) . Chart reviewed, including recent office and hospitalization notes and lab results o Hospitalized 5/14-5/17 o F/u visit with PCP on 5/19 . Discussed all recent hemoglobin results and trend . Discussed colonoscopy and endoscopy results . Discussed plan to f/u with gastroenterologist  o Scheduled for 01/03/20 . Discussed plan to  have repeat lab work on 12/26/19 at [facility] to f/u on Hgb level . Reviewed and discussed medications . Talked about how she feels overall now that her Hgb has improved some o "I feel pretty good. Better than I did". . Provided with RNCM contact information and encouraged to reach out as needed  Patient Self Care Activities:  . Performs ADL's independently . Performs IADL's independently  Initial goal documentation     . "I want to control my knee pain"       CARE PLAN ENTRY (see longtitudinal plan of care for additional care plan information)  Current Barriers:  . Chronic Disease Management support and education needs related to osteoarthritis resulting in chronic knee pain  Nurse Case Manager Clinical Goal(s):  Marland Kitchen Over the next 60 days, patient will continue to work with Dr Alvan Dame with [facility] to address needs related to osteoarthritis and chronic knee pain . Over the next 30 days, patient will talk with RN Care Manager regarding osteoarthritis pain management  Interventions:  . Inter-disciplinary care team collaboration (see longtitudinal plan of care) . Chart reviewed, including office notes, imaging reports, lab results, and referrals o Referred to Dr Alvan Dame in 04/2019 but did not schedule an appointment . Encouraged patient to reach out to the scheduling department at [facility] at [phone] . Reviewed and discussed medications: Diclofenac gel and acetaminophen o Patient is taking Eliquis and has to avoid oral NSAIDs. Recent hospitalization for anemia with unknown cause. Patient concerned that topical NSAID may also be a problem o Reached out to [facility] PharmD to verify that diclofenac gel is safe to use in a patient with anemia that is on eliquis. Awaiting reply.  . Discussed joint pain and affect on ADLs . Provided with CCM contact information. Encouraged to reach out as needed.  Patient Self Care Activities:  . Performs ADL's independently . Performs IADL's  independently  Please see past updates related to this goal by clicking on the "Past Updates" button in the selected goal          Plan:   The care management team will reach out to the patient again over the next 30 days.    Follow-up with GI on 01/03/20  Rck hemoglobin on 12/26/19   Chong Sicilian, BSN, RN-BC Embedded [place] / [place] Management Direct Dial: [phone]

## 2019-12-26 ENCOUNTER — Other Ambulatory Visit: Payer: Medicare Other

## 2019-12-26 ENCOUNTER — Other Ambulatory Visit: Payer: Self-pay

## 2019-12-26 DIAGNOSIS — D649 Anemia, unspecified: Secondary | ICD-10-CM

## 2019-12-26 LAB — CBC WITH DIFFERENTIAL/PLATELET
Basophils Absolute: 0 10*3/uL (ref 0.0–0.2)
Basos: 0 %
EOS (ABSOLUTE): 0.1 10*3/uL (ref 0.0–0.4)
Eos: 2 %
Hematocrit: 28.9 % — ABNORMAL LOW (ref 34.0–46.6)
Hemoglobin: 8.4 g/dL — CL (ref 11.1–15.9)
Immature Grans (Abs): 0 10*3/uL (ref 0.0–0.1)
Immature Granulocytes: 0 %
Lymphocytes Absolute: 2.3 10*3/uL (ref 0.7–3.1)
Lymphs: 30 %
MCH: 20 pg — ABNORMAL LOW (ref 26.6–33.0)
MCHC: 29.1 g/dL — ABNORMAL LOW (ref 31.5–35.7)
MCV: 69 fL — ABNORMAL LOW (ref 79–97)
Monocytes Absolute: 0.7 10*3/uL (ref 0.1–0.9)
Monocytes: 9 %
Neutrophils Absolute: 4.6 10*3/uL (ref 1.4–7.0)
Neutrophils: 59 %
Platelets: 452 10*3/uL — ABNORMAL HIGH (ref 150–450)
RBC: 4.2 x10E6/uL (ref 3.77–5.28)
RDW: 19.8 % — ABNORMAL HIGH (ref 11.7–15.4)
WBC: 7.9 10*3/uL (ref 3.4–10.8)

## 2019-12-27 ENCOUNTER — Ambulatory Visit: Payer: Medicare Other | Admitting: *Deleted

## 2019-12-27 DIAGNOSIS — D649 Anemia, unspecified: Secondary | ICD-10-CM | POA: Diagnosis not present

## 2019-12-27 DIAGNOSIS — M159 Polyosteoarthritis, unspecified: Secondary | ICD-10-CM

## 2019-12-27 DIAGNOSIS — D500 Iron deficiency anemia secondary to blood loss (chronic): Secondary | ICD-10-CM | POA: Diagnosis not present

## 2019-12-27 DIAGNOSIS — M8949 Other hypertrophic osteoarthropathy, multiple sites: Secondary | ICD-10-CM | POA: Diagnosis not present

## 2019-12-27 NOTE — Chronic Care Management (AMB) (Signed)
Chronic Care Management   Follow Up Note   12/27/2019 Name: [name] [name] MRN: [id number] DOB: 08/20/1940  Referred by: Loman Brooklyn, FNP Reason for referral : Chronic Care Management (RN follow up)   [name] [name] is a 79 y.o. year old female who is a primary care patient of Loman Brooklyn, FNP. The CCM team was consulted for assistance with chronic disease management and care coordination needs.    Review of patient status, including review of consultants reports, relevant laboratory and other test results, and collaboration with appropriate care team members and the patient's provider was performed as part of comprehensive patient evaluation and provision of chronic care management services.    SDOH (Social Determinants of Health) assessments performed: No See Care Plan activities for detailed interventions related to [place])    I talked with [name] [name] by telephone today regarding management of her chronic medical conditions.   Outpatient Encounter Medications as of 12/27/2019  Medication Sig  . acetaminophen (TYLENOL) 325 MG tablet Take 2 tablets (650 mg total) by mouth every 6 (six) hours as needed for mild pain or headache (or Fever >/= 101).  Marland Kitchen allopurinol (ZYLOPRIM) 100 MG tablet Take 1 tablet (100 mg total) by mouth daily.  Marland Kitchen amLODipine (NORVASC) 5 MG tablet Take 1 tablet (5 mg total) by mouth daily.  Marland Kitchen apixaban (ELIQUIS) 5 MG TABS tablet Take 1 tablet (5 mg total) by mouth 2 (two) times daily.  . Cholecalciferol (VITAMIN D-3) 5000 UNITS TABS Take 5,000 tablets by mouth daily.  . Colchicine 0.6 MG CAPS Take 1.2 mg (2 capsules) by mouth x1, then 0.6 mg (1 capsule) 1 hour later x1 as needed for gout flare.  . diclofenac sodium (VOLTAREN) 1 % GEL Apply 4 g topically 4 (four) times daily.  . furosemide (LASIX) 20 MG tablet Take 1 tablet (20 mg total) by mouth 2 (two) times daily.  Marland Kitchen levothyroxine (SYNTHROID) 25 MCG tablet Take 1 tablet (25 mcg total) by mouth daily  before breakfast.  . loratadine (CLARITIN) 10 MG tablet Take 10 mg by mouth daily.  [name] 100 MG tablet Take 1 tablet (100 mg total) by mouth every morning.  . metFORMIN (GLUCOPHAGE) 500 MG tablet Take 1 tablet (500 mg total) by mouth 2 (two) times daily with a meal. Give w/food.  . ONE TOUCH ULTRA TEST test strip USE UP TO FOUR TIMES DAILY AS DIRECTED  . oxybutynin (DITROPAN) 5 MG tablet Take 1 tablet (5 mg total) by mouth 3 (three) times daily. Needs to be seen for further refills  . pantoprazole (PROTONIX) 40 MG tablet Take 1 tablet (40 mg total) by mouth 2 (two) times daily before a meal.  . simvastatin (ZOCOR) 40 MG tablet Take 1 tablet (40 mg total) by mouth every evening. Needs to be seen for further refills.   No facility-administered encounter medications on file as of 12/27/2019.    Lab Results  Component Value Date   HGB 8.4 (LL) 12/26/2019     RN Care Plan   . "I need to find out why I'm anemic" (pt-stated)        CARE PLAN ENTRY (see longitudinal plan of care for additional care plan information)  Current Barriers:  . Chronic Disease Management support and education needs related to anemia . On Eliquis for Afib . Neg colonoscopy and endosocpy  Nurse Case Manager Clinical Goal(s):  Marland Kitchen Over the next 30 days, patient will work with gastroenterologist to address needs related  to anemia . Over the next 30 days, patient will reach out to [place] as needed to assist with care coordination  Interventions:  . Inter-disciplinary care team collaboration (see longitudinal plan of care) . Chart reviewed, including recent office and hospitalization notes and lab results . Collaborated with PCP's covering provider regarding Hgb results from yesterday. o Stable at 8.4. Keep f/u with GI on 03/04/20. No changes.  . Discussed with patient  . Provided with RNCM contact information and encouraged to reach out as needed . Encouraged patient to reach out to GI or PCP  with any new or worsening symptoms  Patient Self Care Activities:  . Performs ADL's independently . Performs IADL's independently  Please see past updates related to this goal by clicking on the "Past Updates" button in the selected goal      . "I want to control my knee pain"       [place] (see longtitudinal plan of care for additional care plan information)  Current Barriers:  . Chronic Disease Management support and education needs related to osteoarthritis resulting in chronic knee pain  Nurse Case Manager Clinical Goal(s):  Marland Kitchen Over the next 60 days, patient will continue to work with Dr Alvan Dame with [facility] to address needs related to osteoarthritis and chronic knee pain . Over the next 30 days, patient will talk with RN Care Manager regarding osteoarthritis pain management  Interventions:  . Inter-disciplinary care team collaboration (see longtitudinal plan of care) . Chart reviewed, including office notes, imaging reports, lab results, and referrals . Previously encouraged patient to reach out to the scheduling department at [facility] at [phone] . Reviewed and discussed medications: Diclofenac gel and acetaminophen o Patient is taking Eliquis and has to avoid oral NSAIDs. Recent hospitalization for anemia with unknown cause. Patient concerned that topical NSAID may also be a problem o Collaborated with [facility] PharmD regarding diclofenac gel.  - Ok to use 1 strip twice a day . Advised patient that it is ok to use 1/2 in strip on each knee twice a day . Previously discussed joint pain and affect on ADLs . Provided with CCM contact information. Encouraged to reach out as needed.  Patient Self Care Activities:  . Performs ADL's independently . Performs IADL's independently  Please see past updates related to this goal by clicking on the "Past Updates" button in the selected goal          Plan:   The care management team will reach out to the patient again  over the next 15 days.    Chong Sicilian, BSN, RN-BC Embedded Chronic Care Manager [facility] / [place] Management Direct Dial: [phone]

## 2019-12-27 NOTE — Patient Instructions (Signed)
Visit Information  Goals Addressed            This Visit's Progress     Patient Stated   . "I need to find out why I'm anemic" (pt-stated)        CARE PLAN ENTRY (see longitudinal plan of care for additional care plan information)  Current Barriers:  . Chronic Disease Management support and education needs related to anemia . On Eliquis for Afib . Neg colonoscopy and endosocpy  Nurse Case Manager Clinical Goal(s):  Marland Kitchen Over the next 30 days, patient will work with gastroenterologist to address needs related to anemia . Over the next 30 days, patient will reach out to [place] as needed to assist with care coordination  Interventions:  . Inter-disciplinary care team collaboration (see longitudinal plan of care) . Chart reviewed, including recent office and hospitalization notes and lab results . Collaborated with PCP's covering provider regarding Hgb results from yesterday. o Stable at 8.4. Keep f/u with GI on 03/04/20. No changes.  . Discussed with patient  . Provided with RNCM contact information and encouraged to reach out as needed . Encouraged patient to reach out to GI or PCP with any new or worsening symptoms  Patient Self Care Activities:  . Performs ADL's independently . Performs IADL's independently  Please see past updates related to this goal by clicking on the "Past Updates" button in the selected goal        Other   . "I want to control my knee pain"       CARE PLAN ENTRY (see longtitudinal plan of care for additional care plan information)  Current Barriers:  . Chronic Disease Management support and education needs related to osteoarthritis resulting in chronic knee pain  Nurse Case Manager Clinical Goal(s):  Marland Kitchen Over the next 60 days, patient will continue to work with Dr Alvan Dame with [facility] to address needs related to osteoarthritis and chronic knee pain . Over the next 30 days, patient will talk with RN Care Manager regarding osteoarthritis pain  management  Interventions:  . Inter-disciplinary care team collaboration (see longtitudinal plan of care) . Chart reviewed, including office notes, imaging reports, lab results, and referrals . Previously encouraged patient to reach out to the scheduling department at [facility] at [phone] . Reviewed and discussed medications: Diclofenac gel and acetaminophen o Patient is taking Eliquis and has to avoid oral NSAIDs. Recent hospitalization for anemia with unknown cause. Patient concerned that topical NSAID may also be a problem o Collaborated with [facility] regarding diclofenac gel.  - Ok to use 1 strip twice a day . Advised patient that it is ok to use 1/2 in strip on each knee twice a day . Previously discussed joint pain and affect on ADLs . Provided with CCM contact information. Encouraged to reach out as needed.  Patient Self Care Activities:  . Performs ADL's independently . Performs IADL's independently  Please see past updates related to this goal by clicking on the "Past Updates" button in the selected goal         The patient verbalized understanding of instructions provided today and declined a print copy of patient instruction materials.   Follow-up Plan The care management team will reach out to the patient again over the next 15 days.   Chong Sicilian, BSN, RN-BC Embedded Chronic Care Manager [facility] [place] Management Direct Dial: [phone]

## 2020-01-03 ENCOUNTER — Encounter: Payer: Self-pay | Admitting: Gastroenterology

## 2020-01-03 ENCOUNTER — Ambulatory Visit: Payer: Medicare Other | Admitting: Gastroenterology

## 2020-01-03 ENCOUNTER — Other Ambulatory Visit: Payer: Self-pay

## 2020-01-03 ENCOUNTER — Encounter: Payer: Self-pay | Admitting: *Deleted

## 2020-01-03 ENCOUNTER — Telehealth: Payer: Self-pay | Admitting: *Deleted

## 2020-01-03 DIAGNOSIS — D509 Iron deficiency anemia, unspecified: Secondary | ICD-10-CM | POA: Diagnosis not present

## 2020-01-03 DIAGNOSIS — R079 Chest pain, unspecified: Secondary | ICD-10-CM | POA: Insufficient documentation

## 2020-01-03 NOTE — Progress Notes (Signed)
Referring Provider: Loman Brooklyn, FNP Primary Care Physician:  Loman Brooklyn, FNP  Primary GI: Dr. Gala Romney   Chief Complaint  Patient presents with   hfu    low hemoglobin    HPI:   [name] [name] is a 79 y.o. 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  Received 2 units blood while inpatient.   Hgb remaining stable at 8.4, similar to discharge Hgb. Has had spells where she has felt like someone is boring holes into her back in the past. Hadn't happened in awhile but had episode the other night. Trying to find a link but can't seem to pinpoint triggers. No history of pancreatitis. Protonix BID. Feels like pain in her back. She then noted chest pain with the back pain this time and couldn't get comfortable. No nausea when this happens. No diaphoresis. States her BP was normal when this happened. She was worried enough about this to think about calling 911. Worried about her heart. States if she can burp small amounts feels some improvement. +FH of heart disease. Brother deceased at 64 from blood clot. Brother at 26 deceased from massive heart attack. Another brother sudden death in his 11s,  +heart related.    No overt GI bleeding. Not on iron. Still with some fatigue. No constipation or diarrhea. No N/V.   Past Medical History:  Diagnosis Date   Arthritis    Ostearthritis- hips, knees, fingers   Breast cancer ([place]) 1997   pt denies   DVT (deep venous thrombosis) ([place])    Headache(784.0)    tx. Valproic acid   Hypertension    Hypothyroidism    Pulmonary embolism [facility])     Past Surgical History:  Procedure Laterality Date   ABDOMINAL HYSTERECTOMY     BIOPSY  12/08/2019    Procedure: BIOPSY;  Surgeon: Daneil Dolin, MD;  Location: [facility];  Service: Endoscopy;;   CATARACT EXTRACTION, BILATERAL     COLONOSCOPY N/A 12/08/2019   polyps (tubular adenoma), diverticulosis, colonic lipoma, no surveillance due to age   ESOPHAGOGASTRODUODENOSCOPY N/A 12/08/2019   normal esophagus with 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   JOINT REPLACEMENT Left    7'2012   MULTIPLE TOOTH EXTRACTIONS     60's   PARATHYROIDECTOMY     POLYPECTOMY  12/08/2019   Procedure: POLYPECTOMY;  Surgeon: Daneil Dolin, MD;  Location: [facility];  Service: Endoscopy;;   TOTAL HIP ARTHROPLASTY Right 10/29/2013   Procedure: RIGHT TOTAL HIP ARTHROPLASTY ANTERIOR APPROACH;  Surgeon: Mauri Pole, MD;  Location: [facility];  Service: Orthopedics;  Laterality: Right;    Current Outpatient Medications  Medication Sig Dispense Refill   acetaminophen (TYLENOL) 325 MG tablet Take 2 tablets (650 mg total) by mouth every 6 (six) hours as needed for mild pain or headache (or Fever >/= 101). 12 tablet 0   allopurinol (ZYLOPRIM) 100 MG tablet Take 1 tablet (100 mg total) by mouth daily. 90 tablet 1   amLODipine (NORVASC) 5 MG tablet Take 1 tablet (5 mg total) by mouth daily. 90 tablet 1   apixaban (ELIQUIS) 5 MG TABS tablet Take 1 tablet (5 mg total) by mouth 2 (two) times daily. 60 tablet 5  Cholecalciferol (VITAMIN D-3) 5000 UNITS TABS Take 5,000 tablets by mouth daily.     Colchicine 0.6 MG CAPS Take 1.2 mg (2 capsules) by mouth x1, then 0.6 mg (1 capsule) 1 hour later x1 as needed for gout flare. 9 capsule 2   diclofenac sodium (VOLTAREN) 1 % GEL Apply 4 g topically 4 (four) times daily. 200 g 11   furosemide (LASIX) 20 MG tablet Take 1 tablet (20 mg total) by mouth 2 (two) times daily. 180 tablet 1   levothyroxine (SYNTHROID) 25 MCG tablet Take 1 tablet (25 mcg total) by mouth daily before breakfast. 90 tablet 1   loratadine (CLARITIN) 10 MG  tablet Take 10 mg by mouth daily.     losartan (COZAAR) 100 MG tablet Take 1 tablet (100 mg total) by mouth every morning. 90 tablet 1   metFORMIN (GLUCOPHAGE) 500 MG tablet Take 1 tablet (500 mg total) by mouth 2 (two) times daily with a meal. Give w/food. 180 tablet 1   ONE TOUCH ULTRA TEST test strip USE UP TO FOUR TIMES DAILY AS DIRECTED 400 each 3   oxybutynin (DITROPAN) 5 MG tablet Take 1 tablet (5 mg total) by mouth 3 (three) times daily. Needs to be seen for further refills 270 tablet 0   pantoprazole (PROTONIX) 40 MG tablet Take 1 tablet (40 mg total) by mouth 2 (two) times daily before a meal. 180 tablet 1   simvastatin (ZOCOR) 40 MG tablet Take 1 tablet (40 mg total) by mouth every evening. Needs to be seen for further refills. 90 tablet 1   No current facility-administered medications for this visit.    Allergies as of 01/03/2020   (No Known Allergies)    Family History  Problem Relation Age of Onset   Colitis Sister 35       alive   Cancer Sister 70       colon   Cancer Mother 39       uterine   Heart disease Father 80       heart failure   Heart attack Brother    Healthy Daughter    Healthy Son    Pulmonary embolism Brother    Heart disease Brother    Healthy Son    Healthy Son     Social History   Socioeconomic History   Marital status: Widowed    Spouse name: Not on file   Number of children: 4   Years of education: 12   Highest education level: High school graduate  Occupational History   Occupation: Retired    Comment: [employer]  Tobacco Use   Smoking status: Never Smoker   Smokeless tobacco: Never Used  Scientific laboratory technician Use: Never used  Substance and Sexual Activity   Alcohol use: No    Alcohol/week: 0.0 standard drinks   Drug use: No   Sexual activity: Not Currently  Other Topics Concern   Not on file  Social History Narrative   Patient is widowed and lives in a one story home. She has four adult  children and one son lives with her.    Social Determinants of Health   Financial Resource Strain: Low Risk    Difficulty of Paying Living Expenses: Not hard at all  Food Insecurity: No Food Insecurity   Worried About Charity fundraiser in the Last Year: Never true   Ran Out of Food in the Last Year: Never true  Transportation Needs: No Transportation Needs  Lack of Transportation (Medical): No   Lack of Transportation (Non-Medical): No  Physical Activity: Sufficiently Active   Days of Exercise per Week: 7 days   Minutes of Exercise per Session: 30 min  Stress: No Stress Concern Present   Feeling of Stress : Not at all  Social Connections: Socially Integrated   Frequency of Communication with Friends and Family: More than three times a week   Frequency of Social Gatherings with Friends and Family: More than three times a week   Attends Religious Services: More than 4 times per year   Active Member of [employer] or Organizations: Yes   Attends Music therapist: More than 4 times per year   Marital Status: Married    Review of Systems: Gen: Denies fever, chills, anorexia. Denies fatigue, weakness, weight loss.  CV: see HPI Resp: Denies dyspnea at rest, cough, wheezing, coughing up blood, and pleurisy. GI: see HPI Derm: Denies rash, itching, dry skin Psych: Denies depression, anxiety, memory loss, confusion. No homicidal or suicidal ideation.  Heme: Denies bruising, bleeding, and enlarged lymph nodes.  Physical Exam: BP 126/64    Pulse 68    Temp (!) 97.3 F (36.3 C) (Temporal)    Ht 5\' 3"  (1.6 m)    Wt 208 lb 6.4 oz (94.5 kg)    BMI 36.92 kg/m  General:   Alert and oriented. No distress noted. Pleasant and cooperative.  Head:  Normocephalic and atraumatic. Eyes:  Conjuctiva clear without scleral icterus. Mouth:  Mask in place Abdomen:  +BS, soft, non-tender and non-distended. Sitting up in chair unable to get on exam table.  Msk:  Symmetrical without  gross deformities. Normal posture. Extremities:  Without edema. Neurologic:  Alert and  oriented x4 Psych:  Alert and cooperative. Normal mood and affect.  Lab Results  Component Value Date   WBC 7.9 12/26/2019   HGB 8.4 (LL) 12/26/2019   HCT 28.9 (L) 12/26/2019   MCV 69 (L) 12/26/2019   PLT 452 (H) 12/26/2019     ASSESSMENT: Nocole Zammit is a 79 y.o. female presenting today in hospital follow-up from admission with acute blood loss anemia, IDA, heme positive stool in setting of anticoagulation (Eliquis) for history of PE/DVT. Colonoscopy with polyps and EGD with possible early GAVE, negative H.pylori. Received 2 units PRBCs while inpatient. Here to discuss further evaluation via capsule.  IDA: Hgb remaining stable since discharge. She does feel fatigued at times. Will order IV iron X 1, then recheck CBC and iron studies 4-6 weeks after. Will arrange capsule study as well to rule out small bowel etiology. Remains on Eliquis.  Episode of chest pain: associated with back pain, no other symptoms. No prior cardiology evaluation. Notable family history of heart disease (Brother at 31 deceased after MI, another brother sudden death related to heart in his 36s, father with heart disease). Unable to rule out GI etiology, as she does report burping did help to relieve this episode, but she notes the pain was so severe that she debated ED evaluation and was worried about cardiac etiology. To be thorough, refer to cardiology. US abdomen also ordered.      PLAN:   US abdomen  IV iron X 1, repeat CBC iron studies in 4-6 weeks thereafter  Capsule study ordered  Cardiology referral  To ED if recurrent chest pain  4 month return  [name], PhD, ANP-BC [facility]

## 2020-01-03 NOTE — Telephone Encounter (Signed)
Per [facility] Medicare website : [phone] Gastrointestinal tract imaging, intralum more  Notification/Prior Authorization not required for this service

## 2020-01-03 NOTE — Patient Instructions (Signed)
We are arranging an ultrasound in the near future.  I have also ordered a dose of IV iron. We will recheck blood work 4 weeks or so after IV iron.   We are arranging a capsule study of your small intestines.  I would also like you to see cardiology to ensure no underlying heart disease.   We will see you in 4 months!  I enjoyed seeing you again today! As you know, I value our relationship and want to provide genuine, compassionate, and quality care. I welcome your feedback. If you receive a survey regarding your visit,  I greatly appreciate you taking time to fill this out. See you next time!  [name], PhD, ANP-BC [facility]

## 2020-01-06 NOTE — Progress Notes (Signed)
Cc'ed to pcp °

## 2020-01-07 ENCOUNTER — Ambulatory Visit: Payer: Medicare Other | Admitting: *Deleted

## 2020-01-07 DIAGNOSIS — D500 Iron deficiency anemia secondary to blood loss (chronic): Secondary | ICD-10-CM | POA: Diagnosis not present

## 2020-01-07 DIAGNOSIS — M8949 Other hypertrophic osteoarthropathy, multiple sites: Secondary | ICD-10-CM

## 2020-01-07 DIAGNOSIS — D649 Anemia, unspecified: Secondary | ICD-10-CM | POA: Diagnosis not present

## 2020-01-07 NOTE — Patient Instructions (Signed)
Visit Information  Goals Addressed              This Visit's Progress     Patient Stated   .  "I need to find out why I'm anemic" (pt-stated)         CARE PLAN ENTRY (see longitudinal plan of care for additional care plan information)  Current Barriers:  . Chronic Disease Management support and education needs related to anemia . On Eliquis for Afib . Neg colonoscopy and endosocpy  Nurse Case Manager Clinical Goal(s):  Marland Kitchen Over the next 30 days, patient will work with gastroenterologist to address needs related to anemia . Over the next 30 days, patient will reach out to [place] as needed to assist with care coordination  Interventions:  . Inter-disciplinary care team collaboration (see longitudinal plan of care) . Chart reviewed, including recent office and hospitalization notes and lab results . Discussed visit with GI on 01/03/20 and follow-up plan . Discussed current symptoms. Patient reports that she feels ok.  . Discussed upcoming ultrasound to assess upper GI pain o Advised on where to check in at [facility] . Reviewed and discussed upcoming appointments . Provided with RNCM contact information and encouraged to reach out as needed . Encouraged patient to reach out to GI or PCP with any new or worsening symptoms  Patient Self Care Activities:  . Performs ADL's independently . Performs IADL's independently  Please see past updates related to this goal by clicking on the "Past Updates" button in the selected goal         The patient verbalized understanding of instructions provided today and declined a print copy of patient instruction materials.   Follow-up Plan The care management team will reach out to the patient again over the next 30 days.   Chong Sicilian, BSN, RN-BC Embedded Chronic Care Manager [facility] / [place] Management Direct Dial: [phone]

## 2020-01-07 NOTE — Chronic Care Management (AMB) (Signed)
Chronic Care Management   Follow Up Note   01/07/2020 Name: [name] [name] MRN: [id number] DOB: October 28, 1940  Referred by: Loman Brooklyn, FNP Reason for referral : Chronic Care Management (RN follow up)   [name] [name] is a 79 y.o. year old female who is a primary care patient of Loman Brooklyn, FNP. The CCM team was consulted for assistance with chronic disease management and care coordination needs.    Review of patient status, including review of consultants reports, relevant laboratory and other test results, and collaboration with appropriate care team members and the patient's provider was performed as part of comprehensive patient evaluation and provision of chronic care management services.    SDOH (Social Determinants of Health) assessments performed: No See Care Plan activities for detailed interventions related to [facility])     Outpatient Encounter Medications as of 01/07/2020  Medication Sig  . acetaminophen (TYLENOL) 325 MG tablet Take 2 tablets (650 mg total) by mouth every 6 (six) hours as needed for mild pain or headache (or Fever >/= 101).  Marland Kitchen allopurinol (ZYLOPRIM) 100 MG tablet Take 1 tablet (100 mg total) by mouth daily.  Marland Kitchen amLODipine (NORVASC) 5 MG tablet Take 1 tablet (5 mg total) by mouth daily.  Marland Kitchen apixaban (ELIQUIS) 5 MG TABS tablet Take 1 tablet (5 mg total) by mouth 2 (two) times daily.  . Cholecalciferol (VITAMIN D-3) 5000 UNITS TABS Take 5,000 tablets by mouth daily.  . Colchicine 0.6 MG CAPS Take 1.2 mg (2 capsules) by mouth x1, then 0.6 mg (1 capsule) 1 hour later x1 as needed for gout flare.  . diclofenac sodium (VOLTAREN) 1 % GEL Apply 4 g topically 4 (four) times daily.  . furosemide (LASIX) 20 MG tablet Take 1 tablet (20 mg total) by mouth 2 (two) times daily.  Marland Kitchen levothyroxine (SYNTHROID) 25 MCG tablet Take 1 tablet (25 mcg total) by mouth daily before breakfast.  . loratadine (CLARITIN) 10 MG tablet Take 10 mg by mouth daily.  Marland Kitchen losartan  (COZAAR) 100 MG tablet Take 1 tablet (100 mg total) by mouth every morning.  . metFORMIN (GLUCOPHAGE) 500 MG tablet Take 1 tablet (500 mg total) by mouth 2 (two) times daily with a meal. Give w/food.  . ONE TOUCH ULTRA TEST test strip USE UP TO FOUR TIMES DAILY AS DIRECTED  . oxybutynin (DITROPAN) 5 MG tablet Take 1 tablet (5 mg total) by mouth 3 (three) times daily. Needs to be seen for further refills  . pantoprazole (PROTONIX) 40 MG tablet Take 1 tablet (40 mg total) by mouth 2 (two) times daily before a meal.  . simvastatin (ZOCOR) 40 MG tablet Take 1 tablet (40 mg total) by mouth every evening. Needs to be seen for further refills.   No facility-administered encounter medications on file as of 01/07/2020.     Objective:   RN Care Plan    .  "I need to find out why I'm anemic" (pt-stated)         CARE PLAN ENTRY (see longitudinal plan of care for additional care plan information)  Current Barriers:  . Chronic Disease Management support and education needs related to anemia . On Eliquis for Afib . Neg colonoscopy and endosocpy  Nurse Case Manager Clinical Goal(s):  Marland Kitchen Over the next 30 days, patient will work with gastroenterologist to address needs related to anemia . Over the next 30 days, patient will reach out to [place] as needed to assist with care coordination  Interventions:  .  Inter-disciplinary care team collaboration (see longitudinal plan of care) . Chart reviewed, including recent office and hospitalization notes and lab results . Discussed visit with GI on 01/03/20 and follow-up plan . Discussed current symptoms. Patient reports that she feels ok.  . Discussed upcoming ultrasound to assess upper GI pain o Advised on where to check in at [facility] . Reviewed and discussed upcoming appointments . Provided with RNCM contact information and encouraged to reach out as needed . Encouraged patient to reach out to GI or PCP with any new or worsening  symptoms  Patient Self Care Activities:  . Performs ADL's independently . Performs IADL's independently  Please see past updates related to this goal by clicking on the "Past Updates" button in the selected goal          Plan:   The care management team will reach out to the patient again over the next 30 days.    Chong Sicilian, BSN, RN-BC Embedded Chronic Care Manager [facility] / [place] Management Direct Dial: [phone]

## 2020-01-08 ENCOUNTER — Ambulatory Visit (HOSPITAL_COMMUNITY)
Admission: RE | Admit: 2020-01-08 | Discharge: 2020-01-08 | Disposition: A | Discharge status: Home / Self Care | Payer: Medicare Other | Source: Ambulatory Visit | Attending: Gastroenterology | Admitting: Gastroenterology

## 2020-01-08 ENCOUNTER — Other Ambulatory Visit: Payer: Self-pay

## 2020-01-08 DIAGNOSIS — N281 Cyst of kidney, acquired: Secondary | ICD-10-CM | POA: Diagnosis not present

## 2020-01-08 DIAGNOSIS — R079 Chest pain, unspecified: Secondary | ICD-10-CM | POA: Insufficient documentation

## 2020-01-13 ENCOUNTER — Ambulatory Visit (HOSPITAL_COMMUNITY)
Admission: RE | Admit: 2020-01-13 | Discharge: 2020-01-13 | Disposition: A | Discharge status: Home / Self Care | Payer: Medicare Other | Attending: Internal Medicine | Admitting: Internal Medicine

## 2020-01-13 ENCOUNTER — Encounter (HOSPITAL_COMMUNITY)
Admission: RE | Disposition: A | Discharge status: Home / Self Care | Payer: Self-pay | Source: Home / Self Care | Attending: Internal Medicine

## 2020-01-13 ENCOUNTER — Telehealth: Payer: Self-pay

## 2020-01-13 DIAGNOSIS — R195 Other fecal abnormalities: Secondary | ICD-10-CM | POA: Diagnosis not present

## 2020-01-13 DIAGNOSIS — Z86718 Personal history of other venous thrombosis and embolism: Secondary | ICD-10-CM | POA: Insufficient documentation

## 2020-01-13 DIAGNOSIS — D509 Iron deficiency anemia, unspecified: Secondary | ICD-10-CM | POA: Diagnosis not present

## 2020-01-13 DIAGNOSIS — Z86711 Personal history of pulmonary embolism: Secondary | ICD-10-CM | POA: Insufficient documentation

## 2020-01-13 HISTORY — PX: GIVENS CAPSULE STUDY: SHX5432

## 2020-01-13 SURGERY — IMAGING PROCEDURE, GI TRACT, INTRALUMINAL, VIA CAPSULE

## 2020-01-13 NOTE — Telephone Encounter (Signed)
Orders for Feraheme 510 mg 1vxl, were faxed to [facility] by [facility] on 01/10/20. Orders will be scanned in pts chart.

## 2020-01-15 ENCOUNTER — Encounter (HOSPITAL_COMMUNITY): Payer: Self-pay | Admitting: Internal Medicine

## 2020-01-15 NOTE — Op Note (Addendum)
Small Bowel Givens Capsule Study Procedure date:  01/13/20  Referring Provider:  Roseanne Kaufman, NP PCP:  Dr. Loman Brooklyn, FNP  Indication for procedure:  IDA, heme positive stool  Patient data:  Wt: 93.9 kg Ht: 5\' 3"   Findings:  Complete to the cecum. Abnormal mucosa in the stomach previously seen on EGD suggestive of early GAVE. Scattered small erosions throughout the small bowel. Abnormal small bowel noted between 01:56:08-02:00:00. Mucosa appears edematous with significant inflammation/erosion, somewhat similar yet more severe than abnormalities within the stomach. No discrete mass in this area. Scattered blood flecks/small wisp of blood in the distal small bowel without obvious source. No obvious masses.   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 pylori.  Givens capsule pursued to evaluate for small bowel etiology of IDA.   Findings as per above. Suspect heme positive stool and IDA is likely influenced by oozing from abnormal gastric mucosa and scattered small bowel erosions in the setting of Eliquis, but more concerning/suspected primary source is segment of significantly abnormal small bowel noted between 01:56:08-02:00:00.  Etiology of significant mucosal abnormalities is not clear to me. ?GAVE vs ischemic vs malignant process. No history of cirrhosis. Recent US with hepatic  steatosis, normal spleen. She does have documented weight loss of 18 lbs over the last 9 months although patient notes she has made some effort to lose weight. No regular mid abdominal pain after eating. Has mild intermittent LUQ pain x3-6 months that worsens with spicy foods. Denies NSAIDs.   Plan: CTA ASAP for further evaluation of small bowel.  Update CBC now.   Aliene Altes, PA-C [facility]  Attending note: Pertinent images reviewed.  Agree with assessment and recommendations.

## 2020-01-16 ENCOUNTER — Telehealth: Payer: Self-pay | Admitting: Gastroenterology

## 2020-01-16 ENCOUNTER — Other Ambulatory Visit: Payer: Self-pay

## 2020-01-16 DIAGNOSIS — D509 Iron deficiency anemia, unspecified: Secondary | ICD-10-CM

## 2020-01-16 DIAGNOSIS — R6889 Other general symptoms and signs: Secondary | ICD-10-CM

## 2020-01-16 NOTE — Telephone Encounter (Signed)
error 

## 2020-01-16 NOTE — Telephone Encounter (Signed)
Spoke with patient today about Givens Capsule findings. See Op note for findings. Due to segment of significant abnormal small bowel with edema, inflammation/erosion, I have recommended CTA to evaluate further. Patient is agreeable. Again denies brbpr or melena. No regular postprandial mid abdominal pain. She does have intermittent LUQ pain x3-6 months worsened with spicy foods. No NSAIDs.   [facility] Clinical Pool: Please arrange CTA ASAP. Abnormal small bowel on Givens, need to rule out ischemia.   Alicia: Patient also needs updated CBC at this time. Can be completed when she has CT.

## 2020-01-16 NOTE — Telephone Encounter (Signed)
Sorry I did not specify. CTA Abdomen.

## 2020-01-16 NOTE — Telephone Encounter (Signed)
Noted. Labs placed and faxed to [facility] lab.

## 2020-01-16 NOTE — Telephone Encounter (Signed)
[facility] website and no PA is required for CTA ABD.  CTA scheduled for 6/30, arrival 11:15am, liquids only 4 hours prior to test.  Attempted to call patient several times but the line would only ring once

## 2020-01-16 NOTE — Telephone Encounter (Signed)
Does patient need CTA abd w w/o or CTA abd/pelvis w w/o? thanks

## 2020-01-16 NOTE — Addendum Note (Signed)
Addended by: Cheron Every on: 01/16/2020 03:14 PM   Modules accepted: Orders

## 2020-01-16 NOTE — Telephone Encounter (Signed)
ATC patient again and line ring x1 and then would not ring again

## 2020-01-17 NOTE — Telephone Encounter (Signed)
Called and informed pt of CTA appt. She had seen appt on MyChart also.

## 2020-01-21 ENCOUNTER — Other Ambulatory Visit: Payer: Self-pay

## 2020-01-21 ENCOUNTER — Encounter (HOSPITAL_COMMUNITY): Payer: Self-pay

## 2020-01-21 ENCOUNTER — Other Ambulatory Visit (HOSPITAL_COMMUNITY)
Admission: RE | Admit: 2020-01-21 | Discharge: 2020-01-21 | Disposition: A | Discharge status: Home / Self Care | Payer: Medicare Other | Source: Ambulatory Visit | Attending: Internal Medicine | Admitting: Internal Medicine

## 2020-01-21 DIAGNOSIS — D509 Iron deficiency anemia, unspecified: Secondary | ICD-10-CM | POA: Diagnosis not present

## 2020-01-21 HISTORY — DX: Type 2 diabetes mellitus without complications: E11.9

## 2020-01-21 HISTORY — DX: Gastro-esophageal reflux disease without esophagitis: K21.9

## 2020-01-21 HISTORY — DX: Dyspnea, unspecified: R06.00

## 2020-01-21 HISTORY — DX: Anemia, unspecified: D64.9

## 2020-01-21 MED ORDER — SODIUM CHLORIDE 0.9 % IV SOLN
510.0000 mg | Freq: Once | INTRAVENOUS | Status: AC
  Administered 2020-01-21: 510 mg via INTRAVENOUS
  Filled 2020-01-21: qty 17

## 2020-01-21 MED ORDER — SODIUM CHLORIDE 0.9 % IV SOLN: INTRAVENOUS | Status: DC

## 2020-01-22 ENCOUNTER — Other Ambulatory Visit: Payer: Self-pay

## 2020-01-22 ENCOUNTER — Emergency Department (HOSPITAL_COMMUNITY)
Admission: EM | Admit: 2020-01-22 | Discharge: 2020-01-22 | Disposition: A | Discharge status: Home / Self Care | Payer: Medicare Other | Attending: Emergency Medicine | Admitting: Emergency Medicine

## 2020-01-22 ENCOUNTER — Emergency Department (HOSPITAL_COMMUNITY): Payer: Medicare Other

## 2020-01-22 ENCOUNTER — Ambulatory Visit (HOSPITAL_COMMUNITY)
Admission: RE | Admit: 2020-01-22 | Discharge: 2020-01-22 | Disposition: A | Discharge status: Home / Self Care | Payer: Medicare Other | Source: Ambulatory Visit | Attending: Gastroenterology | Admitting: Gastroenterology

## 2020-01-22 ENCOUNTER — Encounter (HOSPITAL_COMMUNITY): Payer: Self-pay | Admitting: *Deleted

## 2020-01-22 DIAGNOSIS — Z86711 Personal history of pulmonary embolism: Secondary | ICD-10-CM | POA: Insufficient documentation

## 2020-01-22 DIAGNOSIS — M25512 Pain in left shoulder: Secondary | ICD-10-CM | POA: Insufficient documentation

## 2020-01-22 DIAGNOSIS — R6889 Other general symptoms and signs: Secondary | ICD-10-CM

## 2020-01-22 DIAGNOSIS — N281 Cyst of kidney, acquired: Secondary | ICD-10-CM | POA: Diagnosis not present

## 2020-01-22 DIAGNOSIS — Z7984 Long term (current) use of oral hypoglycemic drugs: Secondary | ICD-10-CM | POA: Diagnosis not present

## 2020-01-22 DIAGNOSIS — K551 Chronic vascular disorders of intestine: Secondary | ICD-10-CM | POA: Diagnosis not present

## 2020-01-22 DIAGNOSIS — Z96641 Presence of right artificial hip joint: Secondary | ICD-10-CM | POA: Insufficient documentation

## 2020-01-22 DIAGNOSIS — M19012 Primary osteoarthritis, left shoulder: Secondary | ICD-10-CM | POA: Diagnosis not present

## 2020-01-22 DIAGNOSIS — Z79899 Other long term (current) drug therapy: Secondary | ICD-10-CM | POA: Diagnosis not present

## 2020-01-22 DIAGNOSIS — E039 Hypothyroidism, unspecified: Secondary | ICD-10-CM | POA: Insufficient documentation

## 2020-01-22 DIAGNOSIS — E119 Type 2 diabetes mellitus without complications: Secondary | ICD-10-CM | POA: Diagnosis not present

## 2020-01-22 DIAGNOSIS — Z7901 Long term (current) use of anticoagulants: Secondary | ICD-10-CM | POA: Diagnosis not present

## 2020-01-22 DIAGNOSIS — S43005A Unspecified dislocation of left shoulder joint, initial encounter: Secondary | ICD-10-CM | POA: Diagnosis not present

## 2020-01-22 DIAGNOSIS — Z86718 Personal history of other venous thrombosis and embolism: Secondary | ICD-10-CM | POA: Insufficient documentation

## 2020-01-22 DIAGNOSIS — I701 Atherosclerosis of renal artery: Secondary | ICD-10-CM | POA: Diagnosis not present

## 2020-01-22 DIAGNOSIS — I10 Essential (primary) hypertension: Secondary | ICD-10-CM | POA: Insufficient documentation

## 2020-01-22 DIAGNOSIS — N200 Calculus of kidney: Secondary | ICD-10-CM | POA: Diagnosis not present

## 2020-01-22 DIAGNOSIS — R030 Elevated blood-pressure reading, without diagnosis of hypertension: Secondary | ICD-10-CM

## 2020-01-22 LAB — POCT I-STAT CREATININE: Creatinine, Ser: 1.2 mg/dL — ABNORMAL HIGH (ref 0.44–1.00)

## 2020-01-22 MED ORDER — ETOMIDATE 2 MG/ML IV SOLN
INTRAVENOUS | Status: AC | PRN
  Administered 2020-01-22: 10 mg via INTRAVENOUS

## 2020-01-22 MED ORDER — HYDROCODONE-ACETAMINOPHEN 5-325 MG PO TABS: 1.0000 | ORAL_TABLET | Freq: Four times a day (QID) | ORAL | 0 refills | Status: DC | PRN

## 2020-01-22 MED ORDER — MIDAZOLAM HCL 2 MG/2ML IJ SOLN
1.0000 mg | Freq: Once | INTRAMUSCULAR | Status: DC
  Filled 2020-01-22: qty 2

## 2020-01-22 MED ORDER — MIDAZOLAM HCL 2 MG/2ML IJ SOLN
INTRAMUSCULAR | Status: AC | PRN
  Administered 2020-01-22: 0.5 mg via INTRAVENOUS

## 2020-01-22 MED ORDER — ETOMIDATE 2 MG/ML IV SOLN
10.0000 mg | Freq: Once | INTRAVENOUS | Status: DC
  Filled 2020-01-22: qty 10

## 2020-01-22 MED ORDER — HYDROCODONE-ACETAMINOPHEN 5-325 MG PO TABS
1.0000 | ORAL_TABLET | Freq: Once | ORAL | Status: AC
  Administered 2020-01-22: 1 via ORAL
  Filled 2020-01-22: qty 1

## 2020-01-22 MED ORDER — IOHEXOL 350 MG/ML SOLN
100.0000 mL | Freq: Once | INTRAVENOUS | Status: AC | PRN
  Administered 2020-01-22: 80 mL via INTRAVENOUS

## 2020-01-22 NOTE — ED Provider Notes (Signed)
  Physical Exam  BP (!) 172/79   Pulse 81   Temp 97.9 F (36.6 C) (Oral)   Resp 20   Ht 5\' 3"  (1.6 m)   Wt 93.4 kg   SpO2 100%   BMI 36.49 kg/m   Physical Exam  ED Course/Procedures     .Sedation  Date/Time: 01/22/2020 11:44 PM Performed by: Isla Pence, MD Authorized by: Isla Pence, MD   Consent:    Consent obtained:  Written   Consent given by:  Patient   Alternatives discussed:  Analgesia without sedation Universal protocol:    Immediately prior to procedure a time out was called: yes     Patient identity confirmation method:  Verbally with patient Indications:    Procedure performed:  Dislocation reduction   Procedure necessitating sedation performed by:  Physician performing sedation Pre-sedation assessment:    Time since last food or drink:  5   ASA classification: class 2 - patient with mild systemic disease     Mallampati score:  II - soft palate, uvula, fauces visible   Pre-sedation assessments completed and reviewed: airway patency, cardiovascular function, hydration status, mental status, nausea/vomiting, pain level, respiratory function and temperature   Immediate pre-procedure details:    Reassessment: Patient reassessed immediately prior to procedure   Procedure details (see MAR for exact dosages):    Preoxygenation:  Room air   Sedation:  Etomidate and midazolam   Intended level of sedation: deep   Intra-procedure monitoring:  Blood pressure monitoring, continuous capnometry, cardiac monitor, continuous pulse oximetry, frequent LOC assessments and frequent vital sign checks   Intra-procedure events: none     Total Provider sedation time (minutes):  30 Post-procedure details:    Attendance: Constant attendance by certified staff until patient recovered     Recovery: Patient returned to pre-procedure baseline     Patient is stable for discharge or admission: yes     Patient tolerance:  Tolerated well, no immediate complications Reduction of  dislocation  Date/Time: 01/22/2020 11:45 PM Performed by: Isla Pence, MD Authorized by: Isla Pence, MD  Consent: Written consent obtained. Consent given by: patient Patient identity confirmed: verbally with patient Time out: Immediately prior to procedure a "time out" was called to verify the correct patient, procedure, equipment, support staff and site/side marked as required. Preparation: Patient was prepped and draped in the usual sterile fashion. Local anesthesia used: no  Anesthesia: Local anesthesia used: no  Sedation: Patient sedated: yes Sedation type: moderate (conscious) sedation Sedatives: midazolam and etomidate  Patient tolerance: patient tolerated the procedure well with no immediate complications     MDM         Isla Pence, MD 01/22/20 2346

## 2020-01-22 NOTE — ED Provider Notes (Signed)
[facility] EMERGENCY DEPARTMENT Provider Note   CSN: [id number] Arrival date & time: 01/22/20  1606     History Chief Complaint  Patient presents with  . Shoulder Pain    [name] [name] is a 79 y.o. female history of hypertension, arthritis, diabetes, DVT/PE, GERD, obesity.  Patient presents today for left shoulder pain.  She reports she has been having intermittent pain of the left shoulder for many years and has a history of bursitis.  She reports that she was having some increased left shoulder pain over the last 2-3 days she reports as mild aching constant worse with movement improved with rest.  Pain worsened today when she was getting a follow-up CT scan for an unrelated problem.  She was asked to hold her hands above her head through the duration of the CT scan, when her hand was brought back down she had a pop and severe increase in pain.  She now describes pain as severe constant nonradiating worsened with movement no alleviating factors.  No fever/chills, fall/injury, chest pain/shortness of breath, swelling/color change, abdominal pain, nausea/vomiting, headache/vision changes or any additional concerns.  HPI     Past Medical History:  Diagnosis Date  . Anemia   . Arthritis    Ostearthritis- hips, knees, fingers  . Diabetes mellitus without complication ([place])   . DVT (deep venous thrombosis) ([employer])   . Dyspnea   . GERD (gastroesophageal reflux disease)   . Headache(784.0)    tx. Valproic acid  . Hypertension   . Hypothyroidism   . Pulmonary embolism [facility])     Patient Active Problem List   Diagnosis Date Noted  . Chest pain 01/03/2020  . IDA (iron deficiency anemia) 01/03/2020  . Acute on chronic blood loss anemia 12/09/2019  . Acute on chronic GI bleed on Eliquis 12/09/2019  . Iron deficiency anemia due to chronic blood loss   . Occult blood in stools   . Primary osteoarthritis of both knees 05/14/2019  . Unprovoked DVT and Unprovoked Pulmonary  Embolism -Dxed 03/2019 04/03/2019  . Sciatica of right side 10/18/2018  . Seborrheic keratosis 10/17/2016  . Hyperlipidemia associated with type 2 diabetes mellitus ([place]) 10/17/2016  . Overactive bladder 10/17/2016  . Gastroesophageal reflux disease without esophagitis 10/17/2016  . Primary osteoarthritis involving multiple joints 10/17/2016  . Chronic gout without tophus 10/17/2016  . Type 2 diabetes mellitus without complication, without long-term current use of insulin ([place]) 05/16/2016  . Essential hypertension 05/16/2016  . Hypothyroidism 05/16/2016  . Symptomatic anemia 10/30/2013  . Morbid obesity ([place]) 10/30/2013  . S/P right THA, AA 10/29/2013    Past Surgical History:  Procedure Laterality Date  . ABDOMINAL HYSTERECTOMY    . BIOPSY  12/08/2019   Procedure: BIOPSY;  Surgeon: Daneil Dolin, MD;  Location: [facility] ENDO SUITE;  Service: Endoscopy;;  . CATARACT EXTRACTION, BILATERAL    . COLONOSCOPY N/A 12/08/2019   polyps (tubular adenoma), diverticulosis, colonic lipoma, no surveillance due to age  . 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  [name] N/A 01/13/2020   Procedure: GIVENS CAPSULE STUDY;  Surgeon: Daneil Dolin, MD;  Location: [facility] ENDO SUITE;  Service: Endoscopy;  Laterality: N/A;  7:30am  . JOINT REPLACEMENT Left    [id number]  . MULTIPLE TOOTH EXTRACTIONS     60's  . PARATHYROIDECTOMY    . POLYPECTOMY  12/08/2019   Procedure: POLYPECTOMY;  Surgeon: Daneil Dolin, MD;  Location: [facility] ENDO SUITE;  Service: Endoscopy;;  . TOTAL HIP ARTHROPLASTY Right 10/29/2013   Procedure: RIGHT TOTAL HIP ARTHROPLASTY ANTERIOR APPROACH;  Surgeon: Mauri Pole, MD;  Location: [facility] ORS;  Service: Orthopedics;  Laterality: Right;     OB History   No obstetric history on file.     Family History  Problem Relation Age of Onset  . Colitis Sister 4       alive  . Cancer Sister  91       colon  . Cancer Mother 29       uterine  . Heart disease Father 61       heart failure  . Heart attack Brother   . Healthy Daughter   . Healthy Son   . Pulmonary embolism Brother   . Heart disease Brother   . Healthy Son   . Healthy Son     Social History   Tobacco Use  . Smoking status: Never Smoker  . Smokeless tobacco: Never Used  Vaping Use  . Vaping Use: Never used  Substance Use Topics  . Alcohol use: No    Alcohol/week: 0.0 standard drinks  . Drug use: No    Home Medications Prior to Admission medications   Medication Sig Start Date End Date Taking? Authorizing Provider  acetaminophen (TYLENOL) 325 MG tablet Take 2 tablets (650 mg total) by mouth every 6 (six) hours as needed for mild pain or headache (or Fever >/= 101). 04/04/19   Roxan Hockey, MD  allopurinol (ZYLOPRIM) 100 MG tablet Take 1 tablet (100 mg total) by mouth daily. 12/03/19   Loman Brooklyn, FNP  amLODipine (NORVASC) 5 MG tablet Take 1 tablet (5 mg total) by mouth daily. 12/11/19   Loman Brooklyn, FNP  apixaban (ELIQUIS) 5 MG TABS tablet Take 1 tablet (5 mg total) by mouth 2 (two) times daily. 11/19/19   Loman Brooklyn, FNP  Cholecalciferol (VITAMIN D-3) 5000 UNITS TABS Take 5,000 tablets by mouth daily.    [provider]  Colchicine 0.6 MG CAPS Take 1.2 mg (2 capsules) by mouth x1, then 0.6 mg (1 capsule) 1 hour later x1 as needed for gout flare. 12/11/19   Loman Brooklyn, FNP  diclofenac sodium (VOLTAREN) 1 % GEL Apply 4 g topically 4 (four) times daily. 04/19/19   Terald Sleeper, PA-C  furosemide (LASIX) 20 MG tablet Take 1 tablet (20 mg total) by mouth 2 (two) times daily. 12/11/19   Loman Brooklyn, FNP  HYDROcodone-acetaminophen (NORCO/VICODIN) 5-325 MG tablet Take 1 tablet by mouth every 6 (six) hours as needed. 01/22/20   Deliah Boston, PA-C  levothyroxine (SYNTHROID) 25 MCG tablet Take 1 tablet (25 mcg total) by mouth daily before breakfast. 12/11/19   Loman Brooklyn, FNP  loratadine (CLARITIN) 10 MG tablet Take 10 mg by mouth daily.    [provider]  losartan (COZAAR) 100 MG tablet Take 1 tablet (100 mg total) by mouth every morning. 12/03/19   Loman Brooklyn, FNP  metFORMIN (GLUCOPHAGE) 500 MG tablet Take 1 tablet (500 mg total) by mouth 2 (two) times daily with a meal. Give w/food. 12/11/19   Loman Brooklyn, FNP  ONE TOUCH ULTRA TEST test strip USE UP TO FOUR TIMES DAILY AS DIRECTED 10/04/17   Terald Sleeper, PA-C  oxybutynin (DITROPAN) 5 MG tablet Take 1 tablet (5 mg total) by mouth 3 (three) times daily. Needs to be seen for further refills 07/08/19   Ronnald Ramp,  Londell Moh, PA-C  pantoprazole (PROTONIX) 40 MG tablet Take 1 tablet (40 mg total) by mouth 2 (two) times daily before a meal. 12/11/19   Loman Brooklyn, FNP  simvastatin (ZOCOR) 40 MG tablet Take 1 tablet (40 mg total) by mouth every evening. Needs to be seen for further refills. 12/11/19   Loman Brooklyn, FNP    Allergies    Patient has no known allergies.  Review of Systems   Review of Systems Ten systems are reviewed and are negative for acute change except as noted in the HPI  Physical Exam Updated Vital Signs BP (!) 197/73   Pulse 94   Temp 97.9 F (36.6 C) (Oral)   Resp 18   Ht 5\' 3"  (1.6 m)   Wt 93.4 kg   SpO2 99%   BMI 36.49 kg/m   Physical Exam Constitutional:      General: She is not in acute distress.    Appearance: Normal appearance. She is well-developed. She is not ill-appearing or diaphoretic.  HENT:     Head: Normocephalic and atraumatic.  Eyes:     General: Vision grossly intact. Gaze aligned appropriately.     Pupils: Pupils are equal, round, and reactive to light.  Neck:     Trachea: Trachea and phonation normal.  Cardiovascular:     Rate and Rhythm: Normal rate and regular rhythm.     Pulses:          Radial pulses are 2+ on the right side and 2+ on the left side.  Pulmonary:     Effort: Pulmonary effort is normal. No respiratory distress.    Abdominal:     General: There is no distension.     Palpations: Abdomen is soft.     Tenderness: There is no abdominal tenderness. There is no guarding or rebound.  Musculoskeletal:        General: Normal range of motion.     Cervical back: Normal range of motion.     Comments: Cervical Spine: Appearance normal. No obvious bony deformity. No skin swelling, erythema, heat, fluctuance or break of the skin. No TTP over the cervical spinous processes. Mild left trapezius tenderness without skin changes. No step-offs. Patient is able to actively rotate their neck 45 degrees left and right voluntarily without pain and flex and extend the neck without pain. - Left Shoulder: Appearance normal. No obvious bony deformity. No skin swelling, erythema, heat, fluctuance or break of the skin. No clavicular deformity or TTP. Minimal ttp over anterior deltoid.  Resisted range of motion intact.  Increased pain with any movement of the left shoulder with guarding. - Left Elbow: Appearance normal. No obvious bony deformity. No skin swelling, erythema, heat, fluctuance or break of the skin. No TTP over joint. Active flexion, extension, supination and pronation full and intact without pain. Strength able and appropriate for age for flexion and extension.  Radial Pulse 2+. Cap refill <2 seconds. SILT for M/U/R distributions. Compartments soft.  Strong and equal grip strength.  Skin:    General: Skin is warm and dry.  Neurological:     Mental Status: She is alert.     GCS: GCS eye subscore is 4. GCS verbal subscore is 5. GCS motor subscore is 6.     Comments: Speech is clear and goal oriented, follows commands Major Cranial nerves without deficit, no facial droop Moves extremities without ataxia, coordination intact  Psychiatric:        Behavior: Behavior normal.  ED Results / Procedures / Treatments   Labs (all labs ordered are listed, but only abnormal results are displayed) Labs Reviewed - No data to  display  EKG None  Radiology DG Shoulder Left  Result Date: 01/22/2020 CLINICAL DATA:  Left shoulder pain EXAM: LEFT SHOULDER - 2+ VIEW COMPARISON:  None. FINDINGS: Moderate AC joint degenerative change. Mild glenohumeral degenerative change. No fracture. Mild inferior positioning of the humeral head with respect to the glenoid. IMPRESSION: 1. No fracture identified. 2. Arthritis of the left shoulder and AC joint. Mild inferior positioning of the humeral head with respect to the glenoid fossa, question effusion. There is no anterior or posterior displacement of the humeral head on the Y-view. Electronically Signed   By: Donavan Foil M.D.   On: 01/22/2020 18:04   DG Shoulder Left Portable  Result Date: 01/22/2020 CLINICAL DATA:  Dislocation status post reduction EXAM: LEFT SHOULDER COMPARISON:  01/22/2020 at 5:25 p.m. FINDINGS: Frontal and transscapular views of the left shoulder demonstrate anatomic positioning of the glenohumeral joint. Moderate osteoarthritis of the glenohumeral and acromioclavicular joints are noted. No displaced fracture. Left chest is clear. IMPRESSION: 1. Anatomic alignment of the glenohumeral joint, with moderate osteoarthritis. Electronically Signed   By: Randa Ngo M.D.   On: 01/22/2020 22:00   CT Angio Abd/Pel w/ and/or w/o  Result Date: 01/22/2020 CLINICAL DATA:  79 year old with intermittent left upper quadrant pain. Rule out mesenteric ischemia. EXAM: CT ANGIOGRAPHY ABDOMEN AND PELVIS WITH CONTRAST AND WITHOUT CONTRAST TECHNIQUE: Multidetector CT imaging of the abdomen and pelvis was performed using the standard protocol during bolus administration of intravenous contrast. Multiplanar reconstructed images and MIPs were obtained and reviewed to evaluate the vascular anatomy. CONTRAST:  34mL OMNIPAQUE IOHEXOL 350 MG/ML SOLN COMPARISON:  Virtual colonoscopy 01/25/2016 FINDINGS: VASCULAR Aorta: Atherosclerotic calcifications in the abdominal aorta without aneurysm,  dissection or stenosis. Celiac: Celiac trunk supplies the left gastric artery and splenic artery. Common hepatic artery originates directly from the abdominal aorta. No significant stenosis from the common hepatic artery or celiac trunk. SMA: Mixed plaque at the origin of the superior mesenteric artery with greater than 50% stenosis. Main SMA branches are patent. Renals: Single renal arteries bilaterally. Calcified plaque at the origin of the right renal artery but probably less than 50% stenosis. Calcified plaque at the origin of the left renal artery with approximately 50% stenosis. No evidence for aneurysm or dissection involving the renal arteries. IMA: Patent without stenosis. No significant atherosclerotic plaque and calcifications associated with the IMA. Inflow: Common, internal and external iliac arteries are widely patent. Iliac arteries are tortuous. No significant stenosis. Proximal Outflow: Proximal femoral arteries are patent bilaterally. Veins: Iliac veins, IVC and renal veins are patent. Hepatic veins are patent. Portal venous system is patent. Review of the MIP images confirms the above findings. NON-VASCULAR Lower chest: Tiny nodule in the right middle lobe on sequence 6, image 6 appears to be stable since 2017. Lung bases are clear. Heart size is prominent. No significant pericardial fluid. Hepatobiliary: Normal appearance of the liver and gallbladder. No biliary dilatation. Pancreas: Unremarkable. No pancreatic ductal dilatation or surrounding inflammatory changes. Spleen: Normal in size without focal abnormality. Adrenals/Urinary Tract: Normal adrenal glands bilaterally. Bilateral renal cortical cysts. Largest renal cyst is on the left side and measures up to 6.3 cm. Nonobstructive stone in the right kidney lower pole that measures up to 0.9 cm. Limited evaluation of the urinary bladder due to artifact from the bilateral hip replacements. Stomach/Bowel: Stomach is within  normal limits. Appendix  appears normal. No evidence of bowel wall thickening, distention, or inflammatory changes. Lymphatic: No abdominopelvic lymphadenopathy. Reproductive: Status post hysterectomy. No adnexal masses. Other: No ascites.  Negative for free air. Musculoskeletal: Bilateral hip replacements are located. Degenerative changes at the symphysis pubis. Mild anterolisthesis at L3-L4 and L4-L5. Extensive facet arthropathy in the lower lumbar spine. Multilevel disc space narrowing in the lumbar spine. IMPRESSION: VASCULAR 1. Main mesenteric arteries are patent. Atherosclerotic plaque at the origin of the SMA with at least 50% stenosis. Although the SMA stenosis may be hemodynamically significant, the other mesenteric arteries are widely patent. 2. Bilateral renal artery stenosis. Left renal artery stenosis may be hemodynamically significant measuring at least 50%. NON-VASCULAR 1. No acute bowel abnormality. No evidence for bowel inflammation or ischemia. 2. Nonobstructive right kidney stone. 3. Bilateral renal cysts. 4. Significant degenerative disease in the lumbar spine. Electronically Signed   By: Markus Daft M.D.   On: 01/22/2020 16:01    Procedures .Ortho Injury Treatment  Date/Time: 01/22/2020 10:35 PM Performed by: Deliah Boston, PA-C Authorized by: Deliah Boston, PA-C   Consent:    Consent obtained:  Verbal   Consent given by:  Patient   Risks discussed:  Fracture, nerve damage, restricted joint movement, vascular damage, stiffness, recurrent dislocation and irreducible dislocationInjury location: shoulder Location details: left shoulder Injury type: dislocation Chronicity: new (Subluxation versus dislocation) Pre-procedure neurovascular assessment: neurovascularly intact Pre-procedure distal perfusion: normal Pre-procedure neurological function: normal Pre-procedure range of motion: reduced  Anesthesia: Local anesthesia used: no  Patient sedated: Yes. Refer to sedation procedure  documentation for details of sedation. Manipulation performed: yes Reduction method: external rotation Reduction successful: yes X-ray confirmed reduction: yes Immobilization: sling Post-procedure neurovascular assessment: post-procedure neurovascularly intact Post-procedure distal perfusion: normal Post-procedure neurological function: normal Post-procedure range of motion: unchanged Patient tolerance: patient tolerated the procedure well with no immediate complications Comments: Sedation by Dr. Gilford Raid.  Reduction was performed with Dr. Gilford Raid.  Good range of motion after sedation.  Neurovascular intact.  Placed in sling.  Suspect patient with likely subluxation on prior film.    (including critical care time)  Medications Ordered in ED Medications  etomidate (AMIDATE) injection 10 mg (0 mg Intravenous Hold 01/22/20 2140)  midazolam (VERSED) injection 1 mg (0 mg Intravenous Hold 01/22/20 2140)  HYDROcodone-acetaminophen (NORCO/VICODIN) 5-325 MG per tablet 1 tablet (has no administration in time range)  midazolam (VERSED) injection (0.5 mg Intravenous Given 01/22/20 2134)  etomidate (AMIDATE) injection (10 mg Intravenous Given 01/22/20 2135)    ED Course  I have reviewed the triage vital signs and the nursing notes.  Pertinent labs & imaging results that were available during my care of the patient were reviewed by me and considered in my medical decision making (see chart for details).    MDM Rules/Calculators/A&P                          Additional History Obtained: 1. Nursing notes from this visit. 2. Family, patient's daughter at bedside.  --------------- DG Left Shoulder: IMPRESSION:  1. No fracture identified.  2. Arthritis of the left shoulder and AC joint. Mild inferior  positioning of the humeral head with respect to the glenoid fossa,  question effusion. There is no anterior or posterior displacement of  the humeral head on the Y-view.   I personally reviewed  patient's left shoulder x-ray, concern for possible dislocation versus subluxation.  Reviewed also with Dr. Gilford Raid.  Decision  was made to attempt reduction for possible dislocation.  As above patient was consented for procedure and sedated.  External rotation was used, patient with good range of motion after sedation.  Possibly patient had a subluxation?.  Neurovascular intact post procedure.  Post reduction x-ray below.  DG Left Shoulder:  IMPRESSION:  1. Anatomic alignment of the glenohumeral joint, with moderate  osteoarthritis.  - Patient and family updated on findings.  They will follow-up with orthopedist for follow-up visit.  Precautions discussed.  Patient was given Norco for pain control, she states understanding narcotic precautions, her family members driving home.  I reviewed patient's PDMP she has no previous narcotic prescriptions.  Feel it is reasonable to give patient short course of (6) Norco for acute pain.  Patient lives with her son who will be able to help take care of her at home.  Additionally patient had to be hypertensive in the emergency department today, suspect secondary to pain.  Patient is asymptomatic regarding hypertension and will follow up with your PCP for blood pressure recheck this week and discuss medication management if indicated at that time.  Discussed signs/symptoms of hypertensive urgency/emergency and to return to the emergency department if they occur.   At this time there does not appear to be any evidence of an acute emergency medical condition and the patient appears stable for discharge with appropriate outpatient follow up. Diagnosis was discussed with patient who verbalizes understanding of care plan and is agreeable to discharge. I have discussed return precautions with patient and daughter who verbalizes understanding. Patient encouraged to follow-up with their PCP and ortho. All questions answered.   Patient seen and evaluated by Dr. Gilford Raid  during this visit who agrees with discharge with orthopedic follow-up and Norco.  Note: Portions of this report may have been transcribed using voice recognition software. Every effort was made to ensure accuracy; however, inadvertent computerized transcription errors may still be present. Final Clinical Impression(s) / ED Diagnoses Final diagnoses:  Acute pain of left shoulder  Elevated blood pressure reading    Rx / DC Orders ED Discharge Orders         Ordered    HYDROcodone-acetaminophen (NORCO/VICODIN) 5-325 MG tablet  Every 6 hours PRN     Discontinue  Reprint     01/22/20 2231           Gari Crown 01/22/20 2248    Isla Pence, MD 01/22/20 2347

## 2020-01-22 NOTE — Discharge Instructions (Addendum)
At this time there does not appear to be the presence of an emergent medical condition, however there is always the potential for conditions to change. Please read and follow the below instructions.  Please return to the Emergency Department immediately for any new or worsening symptoms. Please be sure to follow up with your Primary Care Provider within one week regarding your visit today; please call their office to schedule an appointment even if you are feeling better for a follow-up visit. Continue to use the sling to protect your shoulder from further injury.  Please see orthopedist in the next few days for recheck of your left shoulder.  If you are unable to get in touch with the orthopedist you may call Dr. Aline Brochure here in [place] to schedule a follow-up appointment for further evaluation and management. Your blood pressure was high in the emergency department today.  Please have your blood pressure rechecked by your primary care doctor this week and discuss medication management at that time.  Please be sure to take your blood pressure medication as prescribed by your primary care doctor. You may take the medication Norco (Hydrocodone/Acetaminophen) as prescribed to help with severe pain.  This medication will make you drowsy so do not drive, drink alcohol, take other sedating medications or perform any dangerous activities such as driving after taking Norco. Norco contains Tylenol (acetaminophen) so do not take any other Tylenol-containing products with Norco.   Get help right away if you: Get a very bad headache. Start to feel mixed up (confused). Feel weak or numb. Feel faint. Your arm, hand, or fingers: Tingle. Are numb. Are swollen. Are painful. Turn white or blue. Have very bad pain in your: Chest. Belly (abdomen). Throw up more than once. Have trouble breathing. You have any new/concerning or worsening of symptoms  Please read the additional information packets attached  to your discharge summary.  Do not take your medicine if  develop an itchy rash, swelling in your mouth or lips, or difficulty breathing; call 911 and seek immediate emergency medical attention if this occurs.  You may review your lab tests and imaging results in their entirety on your MyChart account.  Please discuss all results of fully with your primary care provider and other specialist at your follow-up visit.  Note: Portions of this text may have been transcribed using voice recognition software. Every effort was made to ensure accuracy; however, inadvertent computerized transcription errors may still be present.

## 2020-01-22 NOTE — ED Triage Notes (Signed)
Pt c/o pain to left shoulder that radiates down to left hand that started today after she was lifting her arms above her head for a CT scan. The CT scan was for something completely different than the shoulder pain.

## 2020-01-23 ENCOUNTER — Other Ambulatory Visit: Payer: Self-pay

## 2020-01-23 DIAGNOSIS — D509 Iron deficiency anemia, unspecified: Secondary | ICD-10-CM

## 2020-01-28 ENCOUNTER — Ambulatory Visit: Payer: Medicare Other | Admitting: Orthopaedic Surgery

## 2020-01-28 ENCOUNTER — Encounter: Payer: Self-pay | Admitting: Orthopaedic Surgery

## 2020-01-28 ENCOUNTER — Other Ambulatory Visit: Payer: Self-pay | Admitting: Family Medicine

## 2020-01-28 ENCOUNTER — Other Ambulatory Visit: Payer: Self-pay

## 2020-01-28 VITALS — Ht 63.0 in | Wt 203.0 lb

## 2020-01-28 DIAGNOSIS — S43022A Posterior subluxation of left humerus, initial encounter: Secondary | ICD-10-CM | POA: Diagnosis not present

## 2020-01-28 DIAGNOSIS — M25512 Pain in left shoulder: Secondary | ICD-10-CM

## 2020-01-28 DIAGNOSIS — K219 Gastro-esophageal reflux disease without esophagitis: Secondary | ICD-10-CM

## 2020-01-28 NOTE — Telephone Encounter (Signed)
NA/VM full RF sent on 12/11/19 #180 with a refill to [facility]

## 2020-01-28 NOTE — Progress Notes (Signed)
Subjective:    Patient ID: [name] [name], female    DOB: [birth year], 79 y.o.   MRN: [id number]  HPI She had dislocation of the left shoulder on 01-22-2020.  She had just had a CT scan and had to keep her arms over her head.  She felt a pop after the scan was done when bringing her hand down.  She was seen in the ER.  I have reviewed the notes and the x-rays.  I do not see a frank dislocation on the initial x-rays but she is subluxed.  I have reviewed the ER notes.  I have independently reviewed and interpreted x-rays of this patient done at another site by another physician or qualified health professional.  She has been in a sling.  She has no numbness.  She has no new trauma.  She has some discomfort but no pain.   Review of Systems  Constitutional: Positive for activity change.  Musculoskeletal: Positive for arthralgias and joint swelling.  All other systems reviewed and are negative.  For Review of Systems, all other systems reviewed and are negative.  The following is a summary of the past history medically, past history surgically, known current medicines, social history and family history.  This information is gathered electronically by the computer from prior information and documentation.  I review this each visit and have found including this information at this point in the chart is beneficial and informative.   Past Medical History:  Diagnosis Date  . Anemia   . Arthritis    Ostearthritis- hips, knees, fingers  . Diabetes mellitus without complication ([place])   . DVT (deep venous thrombosis) ([place])   . Dyspnea   . GERD (gastroesophageal reflux disease)   . Headache(784.0)    tx. Valproic acid  . Hypertension   . Hypothyroidism   . Pulmonary embolism [facility])     Past Surgical History:  Procedure Laterality Date  . ABDOMINAL HYSTERECTOMY    . BIOPSY  12/08/2019   Procedure: BIOPSY;  Surgeon: Daneil Dolin, MD;  Location: [facility];  Service: Endoscopy;;  .  CATARACT EXTRACTION, BILATERAL    . COLONOSCOPY N/A 12/08/2019   polyps (tubular adenoma), diverticulosis, colonic lipoma, no surveillance due to age  . 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  [name] N/A 01/13/2020   Procedure: GIVENS CAPSULE STUDY;  Surgeon: Daneil Dolin, MD;  Location: [facility];  Service: Endoscopy;  Laterality: N/A;  7:30am  . JOINT REPLACEMENT Left    [id number]  . MULTIPLE TOOTH EXTRACTIONS     60's  . PARATHYROIDECTOMY    . POLYPECTOMY  12/08/2019   Procedure: POLYPECTOMY;  Surgeon: Daneil Dolin, MD;  Location: [facility];  Service: Endoscopy;;  . TOTAL HIP ARTHROPLASTY Right 10/29/2013   Procedure: RIGHT TOTAL HIP ARTHROPLASTY ANTERIOR APPROACH;  Surgeon: Mauri Pole, MD;  Location: [facility];  Service: Orthopedics;  Laterality: Right;    Current Outpatient Medications on File Prior to Visit  Medication Sig Dispense Refill  . acetaminophen (TYLENOL) 325 MG tablet Take 2 tablets (650 mg total) by mouth every 6 (six) hours as needed for mild pain or headache (or Fever >/= 101). 12 tablet 0  . allopurinol (ZYLOPRIM) 100 MG tablet Take 1 tablet (100 mg total) by mouth daily. 90 tablet 1  . amLODipine (NORVASC) 5 MG tablet Take 1 tablet (5 mg total) by mouth daily.  90 tablet 1  . apixaban (ELIQUIS) 5 MG TABS tablet Take 1 tablet (5 mg total) by mouth 2 (two) times daily. 60 tablet 5  . Cholecalciferol (VITAMIN D-3) 5000 UNITS TABS Take 5,000 tablets by mouth daily.    . Colchicine 0.6 MG CAPS Take 1.2 mg (2 capsules) by mouth x1, then 0.6 mg (1 capsule) 1 hour later x1 as needed for gout flare. 9 capsule 2  . diclofenac sodium (VOLTAREN) 1 % GEL Apply 4 g topically 4 (four) times daily. 200 g 11  . furosemide (LASIX) 20 MG tablet Take 1 tablet (20 mg total) by mouth 2 (two) times daily. 180 tablet 1  . HYDROcodone-acetaminophen (NORCO/VICODIN) 5-325 MG  tablet Take 1 tablet by mouth every 6 (six) hours as needed. 5 tablet 0  . levothyroxine (SYNTHROID) 25 MCG tablet Take 1 tablet (25 mcg total) by mouth daily before breakfast. 90 tablet 1  . loratadine (CLARITIN) 10 MG tablet Take 10 mg by mouth daily.    [name] 100 MG tablet Take 1 tablet (100 mg total) by mouth every morning. 90 tablet 1  . metFORMIN (GLUCOPHAGE) 500 MG tablet Take 1 tablet (500 mg total) by mouth 2 (two) times daily with a meal. Give w/food. 180 tablet 1  . ONE TOUCH ULTRA TEST test strip USE UP TO FOUR TIMES DAILY AS DIRECTED 400 each 3  . oxybutynin (DITROPAN) 5 MG tablet Take 1 tablet (5 mg total) by mouth 3 (three) times daily. Needs to be seen for further refills 270 tablet 0  . pantoprazole (PROTONIX) 40 MG tablet Take 1 tablet (40 mg total) by mouth 2 (two) times daily before a meal. 180 tablet 1  . simvastatin (ZOCOR) 40 MG tablet Take 1 tablet (40 mg total) by mouth every evening. Needs to be seen for further refills. 90 tablet 1   No current facility-administered medications on file prior to visit.    Social History   Socioeconomic History  . Marital status: Widowed    Spouse name: Not on file  . Number of children: 4  . Years of education: 74  . Highest education level: High school graduate  Occupational History  . 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 Smoking status: Never Smoker  . Smokeless tobacco: Never Used  Vaping Use  . Vaping Use: Never used  Substance and Sexual Activity  . Alcohol use: No    Alcohol/week: 0.0 standard drinks  . Drug use: No  . Sexual activity: Not Currently  Other Topics Concern  . Not on file  Social History Narrative   Patient is widowed and lives in a one story home. She has four adult children and one son lives with her.    Social Determinants of Health   Financial Resource Strain: Low Risk   . Difficulty of Paying Living Expenses: Not hard at all  Food Insecurity: No Food  Insecurity  . Worried About Charity fundraiser in the Last Year: Never true  . Ran Out of Food in the Last Year: Never true  Transportation Needs: No Transportation Needs  . Lack of Transportation (Medical): No  . Lack of Transportation (Non-Medical): No  Physical Activity: Sufficiently Active  . Days of Exercise per Week: 7 days  . Minutes of Exercise per Session: 30 min  Stress: No Stress Concern Present  . Feeling of Stress : Not at all  Social Connections: Socially Integrated  . Frequency of Communication  with Friends and Family: More than three times a week  . Frequency of Social Gatherings with Friends and Family: More than three times a week  . Attends Religious Services: More than 4 times per year  . Active Member of Clubs or Organizations: Yes  . Attends Archivist Meetings: More than 4 times per year  . Marital Status: Married  Human resources officer Violence: Not At Risk  . Fear of Current or Ex-Partner: No  . Emotionally Abused: No  . Physically Abused: No  . Sexually Abused: No    Family History  Problem Relation Age of Onset  . Colitis Sister 3       alive  . Cancer Sister 73       colon  . Cancer Mother 28       uterine  . Heart disease Father 33       heart failure  . Heart attack Brother   . Healthy Daughter   . Healthy Son   . Pulmonary embolism Brother   . Heart disease Brother   . Healthy Son   . Healthy Son     Ht 5\' 3"  (1.6 m)   Wt 203 lb (92.1 kg)   BMI 35.96 kg/m   Body mass index is 35.96 kg/m.      Objective:   Physical Exam Vitals and nursing note reviewed.  Constitutional:      Appearance: She is well-developed.  HENT:     Head: Normocephalic and atraumatic.  Eyes:     Conjunctiva/sclera: Conjunctivae normal.     Pupils: Pupils are equal, round, and reactive to light.  Cardiovascular:     Rate and Rhythm: Normal rate and regular rhythm.  Pulmonary:     Effort: Pulmonary effort is normal.  Abdominal:     Palpations:  Abdomen is soft.  Musculoskeletal:       Arms:     Cervical back: Normal range of motion and neck supple.  Skin:    General: Skin is warm and dry.  Neurological:     Mental Status: She is alert and oriented to person, place, and time.     Cranial Nerves: No cranial nerve deficit.     Motor: No abnormal muscle tone.     Coordination: Coordination normal.     Deep Tendon Reflexes: Reflexes are normal and symmetric. Reflexes normal.  Psychiatric:        Behavior: Behavior normal.        Thought Content: Thought content normal.        Judgment: Judgment normal.           Assessment & Plan:   Encounter Diagnoses  Name Primary?  . Acute pain of left shoulder Yes  . Posterior dislocation of left shoulder joint, initial encounter    She is to continue the sling and tylenol for pain.  Return in two weeks.  Call if any problem.  Precautions discussed.   X-rays on return.  Electronically Signed Sanjuana Kava, MD 7/6/20211:54 PM

## 2020-01-28 NOTE — Telephone Encounter (Signed)
°  Prescription Request  01/28/2020  What is the name of the medication or equipment? protonix 2 a day (hospital changed it to 2 aday) she is about out.   Have you contacted your pharmacy to request a refill? (if applicable) yes  Which pharmacy would you like this sent to? [facility]   Patient notified that their request is being sent to the clinical staff for review and that they should receive a response within 2 business days.

## 2020-01-30 ENCOUNTER — Ambulatory Visit (INDEPENDENT_AMBULATORY_CARE_PROVIDER_SITE_OTHER): Payer: Medicare Other | Admitting: Family Medicine

## 2020-01-30 ENCOUNTER — Encounter: Payer: Self-pay | Admitting: Family Medicine

## 2020-01-30 ENCOUNTER — Other Ambulatory Visit: Payer: Self-pay

## 2020-01-30 VITALS — BP 148/66 | HR 78 | Temp 97.7°F

## 2020-01-30 DIAGNOSIS — K219 Gastro-esophageal reflux disease without esophagitis: Secondary | ICD-10-CM | POA: Diagnosis not present

## 2020-01-30 DIAGNOSIS — D509 Iron deficiency anemia, unspecified: Secondary | ICD-10-CM | POA: Diagnosis not present

## 2020-01-30 DIAGNOSIS — S43005D Unspecified dislocation of left shoulder joint, subsequent encounter: Secondary | ICD-10-CM

## 2020-01-30 MED ORDER — PANTOPRAZOLE SODIUM 40 MG PO TBEC: 40.0000 mg | DELAYED_RELEASE_TABLET | Freq: Two times a day (BID) | ORAL | 0 refills | Status: DC

## 2020-01-30 MED ORDER — PANTOPRAZOLE SODIUM 40 MG PO TBEC: 40.0000 mg | DELAYED_RELEASE_TABLET | Freq: Two times a day (BID) | ORAL | 1 refills | Status: DC

## 2020-01-30 NOTE — Progress Notes (Signed)
Assessment & Plan:  1. Shoulder dislocation, left, subsequent encounter - Doing well.  Wearing sling as advised.  Patient will see Dr. Luna Glasgow in 2 weeks.  2. Gastroesophageal reflux disease without esophagitis - Refill needed.  - pantoprazole (PROTONIX) 40 MG tablet; Take 1 tablet (40 mg total) by mouth 2 (two) times daily before a meal.  Dispense: 14 tablet; Refill: 0   Follow up plan: Return as scheduled.  [name], MSN, APRN, FNP-C [facility]  Subjective:   Patient ID: [name] [name], female    DOB: 09-28-1940, 79 y.o.   MRN: [id number]  HPI: [name] [name] is a 79 y.o. female presenting on 01/30/2020 for ER follow up (AP 6/30- L shoulder pain)  Patient was seen in the pain ER on 01/22/2020 due to a left shoulder dislocation.  Dislocation reduction performed and patient was discharged with orders to follow-up with the orthopedic and for Norco.  She did see Dr. Luna Glasgow on 01/28/2020 at which time she was advised to continue wearing the sling and use Tylenol for pain.  She will return to see him in 2 weeks.   ROS: Negative unless specifically indicated above in HPI.   Relevant past medical history reviewed and updated as indicated.   Allergies and medications reviewed and updated.   Current Outpatient Medications:    acetaminophen (TYLENOL) 325 MG tablet, Take 2 tablets (650 mg total) by mouth every 6 (six) hours as needed for mild pain or headache (or Fever >/= 101)., Disp: 12 tablet, Rfl: 0   allopurinol (ZYLOPRIM) 100 MG tablet, Take 1 tablet (100 mg total) by mouth daily., Disp: 90 tablet, Rfl: 1   amLODipine (NORVASC) 5 MG tablet, Take 1 tablet (5 mg total) by mouth daily., Disp: 90 tablet, Rfl: 1   apixaban (ELIQUIS) 5 MG TABS tablet, Take 1 tablet (5 mg total) by mouth 2 (two) times daily., Disp: 60 tablet, Rfl: 5   Cholecalciferol (VITAMIN D-3) 5000 UNITS TABS, Take 5,000 tablets by mouth daily., Disp: , Rfl:    Colchicine 0.6  MG CAPS, Take 1.2 mg (2 capsules) by mouth x1, then 0.6 mg (1 capsule) 1 hour later x1 as needed for gout flare., Disp: 9 capsule, Rfl: 2   diclofenac sodium (VOLTAREN) 1 % GEL, Apply 4 g topically 4 (four) times daily., Disp: 200 g, Rfl: 11   furosemide (LASIX) 20 MG tablet, Take 1 tablet (20 mg total) by mouth 2 (two) times daily., Disp: 180 tablet, Rfl: 1   HYDROcodone-acetaminophen (NORCO/VICODIN) 5-325 MG tablet, Take 1 tablet by mouth every 6 (six) hours as needed., Disp: 5 tablet, Rfl: 0   levothyroxine (SYNTHROID) 25 MCG tablet, Take 1 tablet (25 mcg total) by mouth daily before breakfast., Disp: 90 tablet, Rfl: 1   loratadine (CLARITIN) 10 MG tablet, Take 10 mg by mouth daily., Disp: , Rfl:    losartan (COZAAR) 100 MG tablet, Take 1 tablet (100 mg total) by mouth every morning., Disp: 90 tablet, Rfl: 1   metFORMIN (GLUCOPHAGE) 500 MG tablet, Take 1 tablet (500 mg total) by mouth 2 (two) times daily with a meal. Give w/food., Disp: 180 tablet, Rfl: 1   ONE TOUCH ULTRA TEST test strip, USE UP TO FOUR TIMES DAILY AS DIRECTED, Disp: 400 each, Rfl: 3   oxybutynin (DITROPAN) 5 MG tablet, Take 1 tablet (5 mg total) by mouth 3 (three) times daily. Needs to be seen for further refills, Disp: 270 tablet, Rfl: 0   pantoprazole (PROTONIX) 40  MG tablet, Take 1 tablet (40 mg total) by mouth 2 (two) times daily before a meal., Disp: 180 tablet, Rfl: 1   simvastatin (ZOCOR) 40 MG tablet, Take 1 tablet (40 mg total) by mouth every evening. Needs to be seen for further refills., Disp: 90 tablet, Rfl: 1  No Known Allergies  Objective:   BP (!) 148/66    Pulse 78    Temp 97.7 F (36.5 C) (Temporal)    SpO2 95%    Physical Exam Vitals reviewed.  Constitutional:      General: She is not in acute distress.    Appearance: Normal appearance. She is not ill-appearing, toxic-appearing or diaphoretic.  HENT:     Head: Normocephalic and atraumatic.  Eyes:     General: No scleral icterus.        Right eye: No discharge.        Left eye: No discharge.     Conjunctiva/sclera: Conjunctivae normal.  Cardiovascular:     Rate and Rhythm: Normal rate.  Pulmonary:     Effort: Pulmonary effort is normal. No respiratory distress.  Musculoskeletal:        General: Normal range of motion.     Cervical back: Normal range of motion.     Comments: Wearing a sling to the left arm.  Skin:    General: Skin is warm and dry.     Capillary Refill: Capillary refill takes less than 2 seconds.  Neurological:     General: No focal deficit present.     Mental Status: She is alert and oriented to person, place, and time. Mental status is at baseline.  Psychiatric:        Mood and Affect: Mood normal.        Behavior: Behavior normal.        Thought Content: Thought content normal.        Judgment: Judgment normal.

## 2020-01-31 LAB — CBC WITH DIFFERENTIAL/PLATELET
Basophils Absolute: 0 10*3/uL (ref 0.0–0.2)
Basos: 1 %
EOS (ABSOLUTE): 0.2 10*3/uL (ref 0.0–0.4)
Eos: 4 %
Hematocrit: 27.4 % — ABNORMAL LOW (ref 34.0–46.6)
Hemoglobin: 8 g/dL — ABNORMAL LOW (ref 11.1–15.9)
Immature Grans (Abs): 0 10*3/uL (ref 0.0–0.1)
Immature Granulocytes: 0 %
Lymphocytes Absolute: 1.3 10*3/uL (ref 0.7–3.1)
Lymphs: 26 %
MCH: 21.9 pg — ABNORMAL LOW (ref 26.6–33.0)
MCHC: 29.2 g/dL — ABNORMAL LOW (ref 31.5–35.7)
MCV: 75 fL — ABNORMAL LOW (ref 79–97)
Monocytes Absolute: 0.6 10*3/uL (ref 0.1–0.9)
Monocytes: 12 %
Neutrophils Absolute: 2.9 10*3/uL (ref 1.4–7.0)
Neutrophils: 57 %
Platelets: 263 10*3/uL (ref 150–450)
RBC: 3.66 x10E6/uL — ABNORMAL LOW (ref 3.77–5.28)
RDW: 23.9 % — ABNORMAL HIGH (ref 11.7–15.4)
WBC: 5.1 10*3/uL (ref 3.4–10.8)

## 2020-02-02 DIAGNOSIS — Z7189 Other specified counseling: Secondary | ICD-10-CM | POA: Insufficient documentation

## 2020-02-02 NOTE — Progress Notes (Signed)
Cardiology Office Note   Date:  02/05/2020   ID:  [name] [name], [name] [name] Oct 01, 1940, MRN [id number]  PCP:  Loman Brooklyn, FNP  Cardiologist:   No primary care provider on file. Referring:  [name] Needs, NP   Chief Complaint  Patient presents with  . Pulmonary embolism      History of Present Illness: [name] [name] is a 79 y.o. female who presents for evaluation of possible chest pain.  However, the patient says this was fleeting in distant and does not happen routinely.  She thinks that she barely mention this but she does have risk factors and other issues.  Echo in 2019 demonstrated an EF of 60 - 65%.  She is not sure why this was ordered.  She did have a pulmonary embolism last year.  This seems to be unprovoked.  I read those records.  She is also had some anemia and an extensive evaluation for possible GI blood loss and there is been no source identified.  She gets around with a cane because of bad knees.  She is able to walk up a ramp to her house and do some household length housekeeping and doesn't bring on any chest pressure, neck or arm discomfort.  She doesn't have any shortness of breath, PND or orthopnea.  She rarely notices some palpitations but doesn't have presyncope or syncope.   Past Medical History:  Diagnosis Date  . Anemia   . Arthritis    Ostearthritis- hips, knees, fingers  . Diabetes mellitus without complication ([place])   . DVT (deep venous thrombosis) ([place])   . Dyspnea   . GERD (gastroesophageal reflux disease)   . Headache(784.0)    tx. Valproic acid  . Hypertension   . Hypothyroidism   . Pulmonary embolism [facility])     Past Surgical History:  Procedure Laterality Date  . ABDOMINAL HYSTERECTOMY    . BIOPSY  12/08/2019   Procedure: BIOPSY;  Surgeon: [name] Dolin, MD;  Location: [facility];  Service: Endoscopy;;  . CATARACT EXTRACTION, BILATERAL    . COLONOSCOPY N/A 12/08/2019   polyps (tubular adenoma), diverticulosis, colonic  lipoma, no surveillance due to age  . 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  [name] N/A 01/13/2020   Procedure: GIVENS CAPSULE STUDY;  Surgeon: [name] Dolin, MD;  Location: [facility];  Service: Endoscopy;  Laterality: N/A;  7:30am  . MULTIPLE TOOTH EXTRACTIONS     60's  . PARATHYROIDECTOMY    . POLYPECTOMY  12/08/2019   Procedure: POLYPECTOMY;  Surgeon: [name] Dolin, MD;  Location: [facility];  Service: Endoscopy;;  . TOTAL HIP ARTHROPLASTY Right 10/29/2013   Procedure: RIGHT TOTAL HIP ARTHROPLASTY ANTERIOR APPROACH;  Surgeon: [name] Pole, MD;  Location: [facility];  Service: Orthopedics;  Laterality: Right;     Current Outpatient Medications  Medication Sig Dispense Refill  . acetaminophen (TYLENOL) 325 MG tablet Take 2 tablets (650 mg total) by mouth every 6 (six) hours as needed for mild pain or headache (or Fever >/= 101). 12 tablet 0  . allopurinol (ZYLOPRIM) 100 MG tablet Take 1 tablet (100 mg total) by mouth daily. 90 tablet 1  . amLODipine (NORVASC) 5 MG tablet Take 1 tablet (5 mg total) by mouth daily. 90 tablet 1  . Cholecalciferol (VITAMIN D-3) 5000 UNITS TABS Take 5,000 tablets by mouth daily.    . Colchicine 0.6  MG CAPS Take 1.2 mg (2 capsules) by mouth x1, then 0.6 mg (1 capsule) 1 hour later x1 as needed for gout flare. 9 capsule 2  . diclofenac sodium (VOLTAREN) 1 % GEL Apply 4 g topically 4 (four) times daily. 200 g 11  . furosemide (LASIX) 20 MG tablet Take 1 tablet (20 mg total) by mouth 2 (two) times daily. 180 tablet 1  . levothyroxine (SYNTHROID) 25 MCG tablet Take 1 tablet (25 mcg total) by mouth daily before breakfast. 90 tablet 1  . loratadine (CLARITIN) 10 MG tablet Take 10 mg by mouth daily.    [name] 100 MG tablet Take 1 tablet (100 mg total) by mouth every morning. 90 tablet 1  . metFORMIN (GLUCOPHAGE) 500 MG tablet Take 1  tablet (500 mg total) by mouth 2 (two) times daily with a meal. Give w/food. 180 tablet 1  . oxybutynin (DITROPAN) 5 MG tablet Take 1 tablet (5 mg total) by mouth 3 (three) times daily. [name] to be seen for further refills 270 tablet 0  . pantoprazole (PROTONIX) 40 MG tablet Take 1 tablet (40 mg total) by mouth 2 (two) times daily before a meal. 14 tablet 0  . simvastatin (ZOCOR) 40 MG tablet Take 1 tablet (40 mg total) by mouth every evening. [name] to be seen for further refills. 90 tablet 1  . apixaban (ELIQUIS) 2.5 MG TABS tablet Take 1 tablet (2.5 mg total) by mouth 2 (two) times daily. 180 tablet 3  . ONE TOUCH ULTRA TEST test strip USE UP TO FOUR TIMES DAILY AS DIRECTED 400 each 3   No current facility-administered medications for this visit.    Allergies:   Patient has no known allergies.    Social History:  The patient  reports that she has never smoked. She has never used smokeless tobacco. She reports that she does not drink alcohol and does not use drugs.   Family History:  The patient's family history includes Cancer (age of onset: 63) in her sister; Cancer (age of onset: 30) in her mother; Colitis (age of onset: 55) in her sister; Healthy in her daughter, son, son, and son; Heart attack (age of onset: 73) in her brother; Heart disease in her brother; Heart disease (age of onset: 42) in her father; Pulmonary embolism (age of onset: 55) in her brother.    ROS:  Please see the history of present illness.   Otherwise, review of systems are positive for none.   All other systems are reviewed and negative.    PHYSICAL EXAM: VS:  BP 140/84   Pulse 71   Ht 5\' 3"  (1.6 m)   Wt 208 lb (94.3 kg)   BMI 36.85 kg/m  , BMI Body mass index is 36.85 kg/m. GEN:  No distress NECK:  No jugular venous distention at 90 degrees, waveform within normal limits, carotid upstroke brisk and symmetric, no bruits, no thyromegaly LYMPHATICS:  No cervical adenopathy LUNGS:  Clear to auscultation  bilaterally BACK:  No CVA tenderness CHEST:  Unremarkable HEART:  S1 and S2 within normal limits, no S3, no S4, no clicks, no rubs, no murmurs ABD:  Positive bowel sounds normal in frequency in pitch, no bruits, no rebound, no guarding, unable to assess midline mass or bruit with the patient seated. EXT:  2 plus pulses throughout, no edema, no cyanosis no clubbing SKIN:  No rashes no nodules NEURO:  Cranial nerves II through XII grossly intact, motor grossly intact throughout PSYCH:  Cognitively  intact, oriented to person place and time   EKG:  EKG is ordered today. The ekg ordered today demonstrates possible junctional rhythm.  P waves not clearly identified although it could be a profound first-degree AV block.  Premature ventricular contractions, borderline voltage criteria for left ventricular hypertrophy.   Recent Labs: 12/03/2019: TSH 2.890 12/07/2019: ALT 16; BUN 14; Potassium 3.7; Sodium 141 01/22/2020: Creatinine, Ser 1.20 01/30/2020: Hemoglobin 8.0; Platelets 263    Lipid Panel    Component Value Date/Time   CHOL 133 12/03/2019 0758   TRIG 97 12/03/2019 0758   HDL 56 12/03/2019 0758   CHOLHDL 2.4 12/03/2019 0758   LDLCALC 59 12/03/2019 0758      Wt Readings from Last 3 Encounters:  02/05/20 208 lb (94.3 kg)  01/28/20 203 lb (92.1 kg)  01/22/20 206 lb (93.4 kg)      Other studies Reviewed: Additional studies/ records that were reviewed today include: Hospital records.  Labs. Review of the above records demonstrates:  Please see elsewhere in the note.     ASSESSMENT AND PLAN:  CHEST PAIN: She is not having any ongoing chest pain.  She does not have severe cardiovascular risk factors.  There is no objective evidence of ischemia.  No further work-up.  PE: I did look through the records.  She [name] maintenance anticoagulation but she can go down to 2.5 mg twice daily Eliquis which I think is most prudent now that she has been greater than 6 months with therapeutic  dose and since she has ongoing anemia with a hemoglobin of 8 and unclear etiology.  I have also suggested she discuss with her primary provider possibly family work-up as there is an extensive history of early death described to MI and at least one sibling with a pulmonary embolism.  HTN: Blood pressure is controlled.  It is borderline but I would not suggest change to medicines.  BRADYCARDIA: I will check a 3-day Zio patch.  She is not having any symptoms related to this.  COVID EDUCATION: She has been vaccinated.   Current medicines are reviewed at length with the patient today.  The patient does not have concerns regarding medicines.  The following changes have been made: As above  Labs/ tests ordered today include: None  Orders Placed This Encounter  Procedures  . LONG TERM MONITOR (3-14 DAYS)  . EKG 12-Lead     Disposition:   FU with me in six months     Signed, Minus Breeding, MD  02/05/2020 11:01 AM    [place]

## 2020-02-05 ENCOUNTER — Ambulatory Visit (INDEPENDENT_AMBULATORY_CARE_PROVIDER_SITE_OTHER): Payer: Medicare Other | Admitting: Cardiology

## 2020-02-05 ENCOUNTER — Telehealth: Payer: Self-pay | Admitting: Radiology

## 2020-02-05 ENCOUNTER — Encounter: Payer: Self-pay | Admitting: Cardiology

## 2020-02-05 ENCOUNTER — Other Ambulatory Visit: Payer: Self-pay

## 2020-02-05 VITALS — BP 140/84 | HR 71 | Ht 63.0 in | Wt 208.0 lb

## 2020-02-05 DIAGNOSIS — I10 Essential (primary) hypertension: Secondary | ICD-10-CM | POA: Diagnosis not present

## 2020-02-05 DIAGNOSIS — I2699 Other pulmonary embolism without acute cor pulmonale: Secondary | ICD-10-CM

## 2020-02-05 DIAGNOSIS — R001 Bradycardia, unspecified: Secondary | ICD-10-CM | POA: Diagnosis not present

## 2020-02-05 DIAGNOSIS — R072 Precordial pain: Secondary | ICD-10-CM

## 2020-02-05 DIAGNOSIS — R6889 Other general symptoms and signs: Secondary | ICD-10-CM

## 2020-02-05 MED ORDER — APIXABAN 2.5 MG PO TABS: 2.5000 mg | ORAL_TABLET | Freq: Two times a day (BID) | ORAL | 3 refills | Status: DC

## 2020-02-05 NOTE — Telephone Encounter (Signed)
Enrolled patient for a 3 day Zio monitor to be mailed to patients home.  

## 2020-02-05 NOTE — Patient Instructions (Signed)
Medication Instructions:  Please decrease Eliquis to 2.5 mg twice a day.  Continue all other medications as listed.  *If you need a refill on your cardiac medications before your next appointment, please call your pharmacy*   Testing/Procedures: ZIO XT- Long Term Monitor Instructions   Your physician has requested you wear your ZIO patch monitor__3__days.   This is a single patch monitor.  [employer] supplies one patch monitor per enrollment.  Additional stickers are not available.   Please do not apply patch if you will be having a Nuclear Stress Test, Echocardiogram, Cardiac CT, MRI, or Chest Xray during the time frame you would be wearing the monitor. The patch cannot be worn during these tests.  You cannot remove and re-apply the ZIO XT patch monitor.   Your ZIO patch monitor will be sent USPS Priority mail from [facility] directly to your home address. The monitor may also be mailed to a PO BOX if home delivery is not available.   It may take 3-5 days to receive your monitor after you have been enrolled.   Once you have received you monitor, please review enclosed instructions.  Your monitor has already been registered assigning a specific monitor serial # to you.   Applying the monitor   Shave hair from upper left chest.   Hold abrader disc by orange tab.  Rub abrader in 40 strokes over left upper chest as indicated in your monitor instructions.   Clean area with 4 enclosed alcohol pads .  Use all pads to assure are is cleaned thoroughly.  Let dry.   Apply patch as indicated in monitor instructions.  Patch will be place under collarbone on left side of chest with arrow pointing upward.   Rub patch adhesive wings for 2 minutes.Remove white label marked "1".  Remove white label marked "2".  Rub patch adhesive wings for 2 additional minutes.   While looking in a mirror, press and release button in center of patch.  A small green light will flash 3-4 times .  This will be your  only indicator the monitor has been turned on.     Do not shower for the first 24 hours.  You may shower after the first 24 hours.   Press button if you feel a symptom. You will hear a small click.  Record Date, Time and Symptom in the Patient Log Book.   When you are ready to remove patch, follow instructions on last 2 pages of Patient Log Book.  Stick patch monitor onto last page of Patient Log Book.   Place Patient Log Book in [place] box.  Use locking tab on box and tape box closed securely.  The [employer] has [employer] on it.  Please place in mailbox as soon as possible.  Your physician should have your test results approximately 7 days after the monitor has been mailed back to [facility].   Call [place] at [phone] if you have questions regarding your ZIO XT patch monitor.  Call them immediately if you see an orange light blinking on your monitor.   If your monitor falls off in less than 4 days contact our Monitor department at [phone].  If your monitor becomes loose or falls off after 4 days call [employer] at [phone] for suggestions on securing your monitor.   Follow-Up: At [facility], you and your health needs are our priority.  As part of our continuing mission to provide you with exceptional heart  care, we have created designated Provider Care Teams.  These Care Teams include your primary Cardiologist (physician) and Advanced Practice Providers (APPs -  Physician Assistants and Nurse Practitioners) who all work together to provide you with the care you need, when you need it.  We recommend signing up for the patient portal called "MyChart".  Sign up information is provided on this After Visit Summary.  MyChart is used to connect with patients for Virtual Visits (Telemedicine).  Patients are able to view lab/test results, encounter notes, upcoming appointments, etc.  Non-urgent messages can be sent to your provider as well.   To  learn more about what you can do with MyChart, go to [email or web address].    Your next appointment:   6 month(s)  The format for your next appointment:   In Person  Provider:   Minus Breeding, MD   Thank you for choosing [facility]!!

## 2020-02-05 NOTE — Progress Notes (Signed)
Ct

## 2020-02-07 ENCOUNTER — Other Ambulatory Visit: Payer: Self-pay

## 2020-02-07 ENCOUNTER — Other Ambulatory Visit (INDEPENDENT_AMBULATORY_CARE_PROVIDER_SITE_OTHER): Payer: Medicare Other

## 2020-02-07 DIAGNOSIS — R001 Bradycardia, unspecified: Secondary | ICD-10-CM | POA: Diagnosis not present

## 2020-02-07 DIAGNOSIS — D649 Anemia, unspecified: Secondary | ICD-10-CM

## 2020-02-11 ENCOUNTER — Ambulatory Visit: Payer: Medicare Other

## 2020-02-11 ENCOUNTER — Ambulatory Visit (INDEPENDENT_AMBULATORY_CARE_PROVIDER_SITE_OTHER): Payer: Medicare Other | Admitting: Orthopaedic Surgery

## 2020-02-11 ENCOUNTER — Ambulatory Visit: Payer: Medicare Other | Admitting: *Deleted

## 2020-02-11 ENCOUNTER — Other Ambulatory Visit: Payer: Self-pay

## 2020-02-11 ENCOUNTER — Encounter: Payer: Self-pay | Admitting: Orthopaedic Surgery

## 2020-02-11 VITALS — Ht 63.0 in | Wt 208.0 lb

## 2020-02-11 DIAGNOSIS — S43015D Anterior dislocation of left humerus, subsequent encounter: Secondary | ICD-10-CM

## 2020-02-11 DIAGNOSIS — S43022D Posterior subluxation of left humerus, subsequent encounter: Secondary | ICD-10-CM

## 2020-02-11 NOTE — Progress Notes (Signed)
My shoulder is not hurting  She is using a sling on the left.  She has no pain.  She has been using it.  NV intact.  I have shown her exercises to do at home.  I will set up OT.  X-rays were done of the left shoulder, reported separately.  Encounter Diagnosis  Name Primary?   Anterior dislocation of left shoulder, subsequent encounter Yes   Return in two weeks.  Go to OT.  Call if any problem.  Precautions discussed.   Electronically Signed Sanjuana Kava, MD 7/20/20214:15 PM

## 2020-02-13 NOTE — Chronic Care Management (AMB) (Signed)
  Chronic Care Management   Follow-up Outreach  02/11/2020 Name: [name] [name] MRN: [id number] DOB: 1940/09/16  Referred by: Loman Brooklyn, FNP Reason for referral : Chronic Care Management (RN follow up)   An unsuccessful telephone follow-up was attempted today. The patient was referred to the case management team for assistance with care management and care coordination.   Follow Up Plan: The care management team will reach out to the patient again over the next 30 days.   SIGNATURE

## 2020-02-17 ENCOUNTER — Ambulatory Visit: Payer: Medicare Other | Attending: Orthopaedic Surgery | Admitting: Physical Therapy

## 2020-02-17 ENCOUNTER — Encounter: Payer: Self-pay | Admitting: Physical Therapy

## 2020-02-17 ENCOUNTER — Other Ambulatory Visit: Payer: Self-pay

## 2020-02-17 DIAGNOSIS — M6281 Muscle weakness (generalized): Secondary | ICD-10-CM | POA: Diagnosis not present

## 2020-02-17 DIAGNOSIS — M25512 Pain in left shoulder: Secondary | ICD-10-CM | POA: Insufficient documentation

## 2020-02-17 DIAGNOSIS — M25612 Stiffness of left shoulder, not elsewhere classified: Secondary | ICD-10-CM | POA: Insufficient documentation

## 2020-02-17 DIAGNOSIS — R600 Localized edema: Secondary | ICD-10-CM | POA: Diagnosis not present

## 2020-02-17 NOTE — Therapy (Addendum)
[facility] [place], [place], [address] Phone: [phone]   Fax:  [phone]  Physical Therapy Evaluation  Patient Details  Name: [name] [name] MRN: [id number] Date of Birth: 10-15-1940 Referring Provider (PT): Sanjuana Kava, MD   Encounter Date: 02/17/2020   PT End of Session - 02/17/20 1322    Visit Number 1    Number of Visits 16    Date for PT Re-Evaluation 04/20/20    Authorization Type FOTO; Progress note every 10th visit; KX modifier at 15th visit    PT Start Time 1030    PT Stop Time 1118    PT Time Calculation (min) 48 min    Equipment Utilized During Treatment Other (comment)   SPC   Activity Tolerance Patient tolerated treatment well    Behavior During Therapy [facility] for tasks assessed/performed           Past Medical History:  Diagnosis Date   Anemia    Arthritis    Ostearthritis- hips, knees, fingers   Diabetes mellitus without complication (HCC)    DVT (deep venous thrombosis) (HCC)    Dyspnea    GERD (gastroesophageal reflux disease)    Headache(784.0)    tx. Valproic acid   Hypertension    Hypothyroidism    Pulmonary embolism [facility])     Past Surgical History:  Procedure Laterality Date   ABDOMINAL HYSTERECTOMY     BIOPSY  12/08/2019   Procedure: BIOPSY;  Surgeon: Daneil Dolin, MD;  [facility];  Service: Endoscopy;;   CATARACT EXTRACTION, BILATERAL     COLONOSCOPY N/A 12/08/2019   polyps (tubular adenoma), diverticulosis, colonic lipoma, no surveillance due to age   ESOPHAGOGASTRODUODENOSCOPY N/A 12/08/2019   normal esophagus with 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   GIVENS CAPSULE STUDY N/A 01/13/2020   Procedure: GIVENS CAPSULE STUDY;  Surgeon: Daneil Dolin, MD;  [facility];  Service: Endoscopy;  Laterality: N/A;  7:30am   MULTIPLE TOOTH EXTRACTIONS     60's   PARATHYROIDECTOMY     POLYPECTOMY   12/08/2019   Procedure: POLYPECTOMY;  Surgeon: Daneil Dolin, MD;  [facility];  Service: Endoscopy;;   TOTAL HIP ARTHROPLASTY Right 10/29/2013   Procedure: RIGHT TOTAL HIP ARTHROPLASTY ANTERIOR APPROACH;  Surgeon: Mauri Pole, MD;  [facility]: [facility];  Service: Orthopedics;  Laterality: Right;    There were no vitals filed for this visit.    Subjective Assessment - 02/17/20 1304    Subjective COVID-19 screening performed upon arrival. Patient arrives to physical therapy with reports of left shoulder pain and decreased ROM due to an anterior dislocation on 01/22/2020. Patient reported having her hands above her head for a CT scan and when she was bringing her arm down, she felt a pain and required active assist from right arm to bring the arm down to her side. Patient reports going to the ED where the anterior dislocation was reduced under sedation. Patient reports being compliant with wearing the sling and performing HEP provided by referring MD. Patient reports ability to perform ADLs but with increased time. Patient reports pain at worst as 4/97 with certain movements; pain at best as 0/10. Patients goals are to improve movement and return to PLOF.    Pertinent History closed reduction of anterior shoulder dislocation 01/22/2020, HTN, osteopenia, DM 2,    Limitations House hold activities;Lifting    Diagnostic tests x-ray: see imaging    Patient  Stated Goals improve movement    Currently in Pain? No/denies    Pain Score 5    "at worst" no pain at evaluation   Pain Location Shoulder    Pain Orientation Left    Pain Descriptors / Indicators Discomfort    Pain Type Acute pain    Pain Radiating Towards lateral deltoid region    Pain Onset 1 to 4 weeks ago    Pain Frequency Occasional    Aggravating Factors  certain movements    Pain Relieving Factors tylenol as needed; rest    Effect of Pain on Daily Activities slow to perform ADLs, unable to lift arm.              [facility]  PT Assessment - 02/17/20 0001      Assessment   Medical Diagnosis Anterior dislocation of left shoulder, subsequent encounter    Referring Provider (PT) Sanjuana Kava, MD    Onset Date/Surgical Date 01/22/20    Hand Dominance Right    Next MD Visit 02/25/2020    Prior Therapy no      Precautions   Precautions Shoulder    Precaution Comments no active flexion or abduction per MD      Restrictions   Weight Bearing Restrictions No      Balance Screen   Has the patient fallen in the past 6 months No    Has the patient had a decrease in activity level because of a fear of falling?  Yes    Is the patient reluctant to leave their home because of a fear of falling?  No      Home Environment   Living Environment Private residence    Living Arrangements Children    Available Help at Discharge Family      Prior Function   Level of Independence Independent with basic ADLs;Needs assistance with homemaking      Observation/Other Assessments   Observations L arm in sling    Focus on Therapeutic Outcomes (FOTO)  72% limitation      Posture/Postural Control   Posture/Postural Control Postural limitations    Postural Limitations Rounded Shoulders;Forward head    Posture Comments left shoulder in guarded position, slight left shoulder hike.      ROM / Strength   AROM / PROM / Strength PROM      PROM   PROM Assessment Site Shoulder    Right/Left Shoulder Left    Left Shoulder Flexion 20 Degrees    Left Shoulder ABduction 40 Degrees    Left Shoulder Internal Rotation --   to abdomen   Left Shoulder External Rotation -1 Degrees      Palpation   Palpation comment minimal tenderness to palpation to left shoulder                      Objective measurements completed on examination: See above findings.               PT Education - 02/17/20 1321    Education Details close chain shoulder flexion on table    Person(s) Educated Patient    Methods  Explanation;Demonstration;Handout    Comprehension Verbalized understanding;Returned demonstration            PT Short Term Goals - 02/17/20 1328      PT SHORT TERM GOAL #1   Title Patient will be independent with HEP    Time 4    Period Weeks    Status New  PT SHORT TERM GOAL #2   Title Patient will demonstrate 130+ degrees of left shoulder PROM to improve shoulder mobility    Time 4    Period Weeks    Status New      PT SHORT TERM GOAL #3   Title Patient will demonsrrate 45+ degrees of left shoulder PROM to improve shoulder mobility.    Time 4    Period Weeks    Status New             PT Long Term Goals - 02/17/20 1329      PT LONG TERM GOAL #1   Title Patient will be independent with advanced HEP    Time 8    Period Weeks    Status New      PT LONG TERM GOAL #2   Title Patient will demonstrate 130+ degrees of left shoulder flexion AROM to improve ability to perform overhead tasks.    Time 8    Period Weeks    Status New      PT LONG TERM GOAL #3   Title Patient will demonstereat 50+ degrees of left shoulder ER AROM to improve donning and doffing apparel.    Time 8    Period Weeks    Status New      PT LONG TERM GOAL #4   Title Patient will report ability to perform ADLs and home tasks with left shoulder pain less than or equal to 2/10.    Time 8    Period Weeks    Status New      PT LONG TERM GOAL #5   Title Patient will demonstrate 4/5 or greater left shoulder MMT in all planes to improve stability during functional tasks.    Time 8    Period Weeks    Status New                  Plan - 02/17/20 1323    Clinical Impression Statement Patient is a 79 year old right handed female who presents to physical therapy with left shoulder pain, decreased left shoulder ROM, and abnormal posture secondary to a left anterior shoulder dislocation sustained on 01/22/2020. Patient very apprehensive with PROM despite cuing for relaxation and intermittent  oscillations to decrease guarding. Patient reported minimal tenderness upon palpation of shoulder musculature. Patient noted in a guarded posture with L shoulder protracted and slightly elevated in sling. Patient and PT discussed plan of care and HEP to which patient reported understanding. Patient would benefit from skilled physical therapy to address deficits and address patients goals.    Personal Factors and Comorbidities Age;Comorbidity 2    Comorbidities closed reduction of left anterior shoulder dislocation 01/22/2020; HTN, DM    Examination-Activity Limitations Reach Overhead;Dressing;Lift    Examination-Participation Restrictions Cleaning;Driving    Stability/Clinical Decision Making Stable/Uncomplicated    Clinical Decision Making Low    Rehab Potential Good    PT Frequency 2x / week    PT Duration 8 weeks    PT Treatment/Interventions ADLs/Self Care Home Management;Cryotherapy;Electrical Stimulation;Iontophoresis 4mg /ml Dexamethasone;Ultrasound;Moist Heat;Neuromuscular re-education;Manual techniques;Passive range of motion;Therapeutic exercise;Therapeutic activities;Patient/family education;Vasopneumatic Device    PT Next Visit Plan PROM to left shoulder, be very cautious with shoulder extension and ER, progress to pulleys and ranger per tolerance, Modalities PRN for pain relief.    PT Home Exercise Plan see patient education section    Consulted and Agree with Plan of Care Patient  Patient will benefit from skilled therapeutic intervention in order to improve the following deficits and impairments:  Decreased activity tolerance, Decreased strength, Pain, Impaired UE functional use, Postural dysfunction  Visit Diagnosis: Stiffness of left shoulder, not elsewhere classified - Plan: PT plan of care cert/re-cert  Acute pain of left shoulder - Plan: PT plan of care cert/re-cert  Muscle weakness (generalized) - Plan: PT plan of care cert/re-cert  Localized edema - Plan: PT  plan of care cert/re-cert     Problem List Patient Active Problem List   Diagnosis Date Noted   Educated about COVID-19 virus infection 02/02/2020   Chest pain 01/03/2020   IDA (iron deficiency anemia) 01/03/2020   Acute on chronic blood loss anemia 12/09/2019   Acute on chronic GI bleed on Eliquis 12/09/2019   Iron deficiency anemia due to chronic blood loss    Occult blood in stools    Primary osteoarthritis of both knees 05/14/2019   Unprovoked DVT and Unprovoked Pulmonary Embolism -Dxed 03/2019 04/03/2019   Sciatica of right side 10/18/2018   Seborrheic keratosis 10/17/2016   Hyperlipidemia associated with type 2 diabetes mellitus ([place]) 10/17/2016   Overactive bladder 10/17/2016   Gastroesophageal reflux disease without esophagitis 10/17/2016   Primary osteoarthritis involving multiple joints 10/17/2016   Chronic gout without tophus 10/17/2016   Type 2 diabetes mellitus without complication, without long-term current use of insulin ([place]) 05/16/2016   Essential hypertension 05/16/2016   Hypothyroidism 05/16/2016   Symptomatic anemia 10/30/2013   Morbid obesity ([place]) 10/30/2013   S/P right THA, AA 10/29/2013    Gabriela Eves, PT, DPT 02/17/2020, 1:46 PM  [facility] [address] [place], [place], [address] Phone: [phone]   Fax:  [phone]  Name: [name] [name] MRN: [id number] Date of Birth: 07/17/1941

## 2020-02-17 NOTE — Addendum Note (Signed)
Addended by: Gabriela Eves on: 02/17/2020 01:47 PM   Modules accepted: Orders

## 2020-02-19 ENCOUNTER — Encounter: Payer: Self-pay | Admitting: *Deleted

## 2020-02-20 DIAGNOSIS — R001 Bradycardia, unspecified: Secondary | ICD-10-CM | POA: Diagnosis not present

## 2020-02-21 ENCOUNTER — Ambulatory Visit: Payer: Medicare Other | Admitting: Physical Therapy

## 2020-02-21 ENCOUNTER — Encounter: Payer: Self-pay | Admitting: Physical Therapy

## 2020-02-21 ENCOUNTER — Other Ambulatory Visit: Payer: Self-pay

## 2020-02-21 DIAGNOSIS — M25612 Stiffness of left shoulder, not elsewhere classified: Secondary | ICD-10-CM | POA: Diagnosis not present

## 2020-02-21 DIAGNOSIS — M25512 Pain in left shoulder: Secondary | ICD-10-CM

## 2020-02-21 DIAGNOSIS — M6281 Muscle weakness (generalized): Secondary | ICD-10-CM | POA: Diagnosis not present

## 2020-02-21 DIAGNOSIS — R600 Localized edema: Secondary | ICD-10-CM

## 2020-02-21 NOTE — Therapy (Signed)
[facility] [place], [place], [address] Phone: [phone]   Fax:  [phone]  Physical Therapy Treatment  Patient Details  Name: [name] [name] MRN: [id number] Date of Birth: 12-20-1940 Referring Provider (PT): Sanjuana Kava, MD   Encounter Date: 02/21/2020   PT End of Session - 02/21/20 0951    Visit Number 2    Number of Visits 16    Date for PT Re-Evaluation 04/20/20    Authorization Type FOTO; Progress note every 10th visit; KX modifier at 15th visit    PT Start Time 0951    PT Stop Time 1032    PT Time Calculation (min) 41 min    Equipment Utilized During Treatment Other (comment)   sling, SPC   Activity Tolerance Patient tolerated treatment well    Behavior During Therapy [facility] for tasks assessed/performed           Past Medical History:  Diagnosis Date  . Anemia   . Arthritis    Ostearthritis- hips, knees, fingers  . Diabetes mellitus without complication ([place])   . DVT (deep venous thrombosis) ([place])   . Dyspnea   . GERD (gastroesophageal reflux disease)   . Headache(784.0)    tx. Valproic acid  . Hypertension   . Hypothyroidism   . Pulmonary embolism [facility])     Past Surgical History:  Procedure Laterality Date  . ABDOMINAL HYSTERECTOMY    . BIOPSY  12/08/2019   Procedure: BIOPSY;  Surgeon: Daneil Dolin, MD;  [facility];  Service: Endoscopy;;  . CATARACT EXTRACTION, BILATERAL    . COLONOSCOPY N/A 12/08/2019   polyps (tubular adenoma), diverticulosis, colonic lipoma, no surveillance due to age  . 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  [name] N/A 01/13/2020   Procedure: GIVENS CAPSULE STUDY;  Surgeon: Daneil Dolin, MD;  [facility];  Service: Endoscopy;  Laterality: N/A;  7:30am  . MULTIPLE TOOTH EXTRACTIONS     60's  . PARATHYROIDECTOMY    .  POLYPECTOMY  12/08/2019   Procedure: POLYPECTOMY;  Surgeon: Daneil Dolin, MD;  [facility];  Service: Endoscopy;;  . TOTAL HIP ARTHROPLASTY Right 10/29/2013   Procedure: RIGHT TOTAL HIP ARTHROPLASTY ANTERIOR APPROACH;  Surgeon: Mauri Pole, MD;  [facility];  Service: Orthopedics;  Laterality: Right;    There were no vitals filed for this visit.   Subjective Assessment - 02/21/20 0950    Subjective COVID 19 screening performed on patient upon arrival. Reports discomfort in posterior neck and some shoulder.    Pertinent History closed reduction of anterior shoulder dislocation 01/22/2020, HTN, osteopenia, DM 2,    Limitations House hold activities;Lifting    Diagnostic tests x-ray: see imaging    Patient Stated Goals improve movement    Currently in Pain? Yes    Pain Score 4     Pain Location Shoulder    Pain Orientation Left    Pain Descriptors / Indicators Discomfort;Sore    Pain Type Acute pain    Pain Onset 1 to 4 weeks ago    Pain Frequency Intermittent              [facility] PT Assessment - 02/21/20 0001      Assessment   Medical Diagnosis Anterior dislocation of left shoulder, subsequent encounter    Referring Provider (PT) Sanjuana Kava, MD    Onset Date/Surgical Date 01/22/20  Hand Dominance Right    Next MD Visit 02/25/2020    Prior Therapy no      Precautions   Precautions Shoulder    Precaution Comments no active flexion or abduction per MD      Restrictions   Weight Bearing Restrictions No                         [facility] Adult PT Treatment/Exercise - 02/21/20 0001      Modalities   Modalities Vasopneumatic      Vasopneumatic   Number Minutes Vasopneumatic  10 minutes    Vasopnuematic Location  Shoulder    Vasopneumatic Pressure Low    Vasopneumatic Temperature  34/pain      Manual Therapy   Manual Therapy Passive ROM    Passive ROM Gentle PROM of L shoulder into flexion, ER with gentle holds at end range                     PT Short Term Goals - 02/21/20 1041      PT SHORT TERM GOAL #1   Title Patient will be independent with HEP    Time 4    Period Weeks    Status On-going      PT SHORT TERM GOAL #2   Title Patient will demonstrate 130+ degrees of left shoulder PROM to improve shoulder mobility    Time 4    Period Weeks    Status On-going      PT SHORT TERM GOAL #3   Title Patient will demonsrrate 45+ degrees of left shoulder PROM to improve shoulder mobility.    Time 4    Period Weeks    Status On-going             PT Long Term Goals - 02/21/20 1041      PT LONG TERM GOAL #1   Title Patient will be independent with advanced HEP    Time 8    Period Weeks    Status On-going      PT LONG TERM GOAL #2   Title Patient will demonstrate 130+ degrees of left shoulder flexion AROM to improve ability to perform overhead tasks.    Time 8    Period Weeks    Status On-going      PT LONG TERM GOAL #3   Title Patient will demonstereat 50+ degrees of left shoulder ER AROM to improve donning and doffing apparel.    Time 8    Period Weeks    Status On-going      PT LONG TERM GOAL #4   Title Patient will report ability to perform ADLs and home tasks with left shoulder pain less than or equal to 2/10.    Time 8    Period Weeks    Status On-going      PT LONG TERM GOAL #5   Title Patient will demonstrate 4/5 or greater left shoulder MMT in all planes to improve stability during functional tasks.    Time 8    Period Weeks    Status On-going                 Plan - 02/21/20 1037    Clinical Impression Statement Patient presented in clinic with reports of discomfort and soreness of L shoulder. Patient able to tolerate gentle PROM of L shoulder well with only reports of intermittant discomfort with PROM. Patient able to relax much more  during PROM session today. Oscillations provided during PROM. Some discomfort reported in L deltoids during PROM. Normal vasopneumatic  response noted following removal of the modality. Patient compliant with sling use.    Personal Factors and Comorbidities Age;Comorbidity 2    Comorbidities closed reduction of left anterior shoulder dislocation 01/22/2020; HTN, DM    Examination-Activity Limitations Reach Overhead;Dressing;Lift    Examination-Participation Restrictions Cleaning;Driving    Stability/Clinical Decision Making Stable/Uncomplicated    Rehab Potential Good    PT Frequency 2x / week    PT Duration 8 weeks    PT Treatment/Interventions ADLs/Self Care Home Management;Cryotherapy;Electrical Stimulation;Iontophoresis 4mg /ml Dexamethasone;Ultrasound;Moist Heat;Neuromuscular re-education;Manual techniques;Passive range of motion;Therapeutic exercise;Therapeutic activities;Patient/family education;Vasopneumatic Device    PT Next Visit Plan PROM to left shoulder, be very cautious with shoulder extension and ER, progress to pulleys and ranger per tolerance, Modalities PRN for pain relief.    PT Home Exercise Plan see patient education section    Consulted and Agree with Plan of Care Patient           Patient will benefit from skilled therapeutic intervention in order to improve the following deficits and impairments:  Decreased activity tolerance, Decreased strength, Pain, Impaired UE functional use, Postural dysfunction  Visit Diagnosis: Stiffness of left shoulder, not elsewhere classified  Acute pain of left shoulder  Muscle weakness (generalized)  Localized edema     Problem List Patient Active Problem List   Diagnosis Date Noted  . Educated about COVID-19 virus infection 02/02/2020  . Chest pain 01/03/2020  . IDA (iron deficiency anemia) 01/03/2020  . Acute on chronic blood loss anemia 12/09/2019  . Acute on chronic GI bleed on Eliquis 12/09/2019  . Iron deficiency anemia due to chronic blood loss   . Occult blood in stools   . Primary osteoarthritis of both knees 05/14/2019  . Unprovoked DVT and  Unprovoked Pulmonary Embolism -Dxed 03/2019 04/03/2019  . Sciatica of right side 10/18/2018  . Seborrheic keratosis 10/17/2016  . Hyperlipidemia associated with type 2 diabetes mellitus ([place]) 10/17/2016  . Overactive bladder 10/17/2016  . Gastroesophageal reflux disease without esophagitis 10/17/2016  . Primary osteoarthritis involving multiple joints 10/17/2016  . Chronic gout without tophus 10/17/2016  . Type 2 diabetes mellitus without complication, without long-term current use of insulin ([place]) 05/16/2016  . Essential hypertension 05/16/2016  . Hypothyroidism 05/16/2016  . Symptomatic anemia 10/30/2013  . Morbid obesity ([place]) 10/30/2013  . S/P right THA, AA 10/29/2013    Standley Brooking, PTA 02/21/2020, 10:42 AM  [facility] [address] [place], [place], [address] Phone: [phone]   Fax:  [phone]  Name: Verdis Bassette MRN: [id number] Date of Birth: 11-16-1940

## 2020-02-24 ENCOUNTER — Other Ambulatory Visit: Payer: Self-pay

## 2020-02-24 ENCOUNTER — Ambulatory Visit: Payer: Medicare Other | Attending: Orthopaedic Surgery | Admitting: Physical Therapy

## 2020-02-24 DIAGNOSIS — M25512 Pain in left shoulder: Secondary | ICD-10-CM | POA: Diagnosis not present

## 2020-02-24 DIAGNOSIS — R600 Localized edema: Secondary | ICD-10-CM | POA: Diagnosis not present

## 2020-02-24 DIAGNOSIS — M6281 Muscle weakness (generalized): Secondary | ICD-10-CM | POA: Insufficient documentation

## 2020-02-24 DIAGNOSIS — M25612 Stiffness of left shoulder, not elsewhere classified: Secondary | ICD-10-CM | POA: Diagnosis not present

## 2020-02-24 NOTE — Therapy (Signed)
[facility] [place], [place], [address] Phone: [phone]   Fax:  [phone]  Physical Therapy Treatment  Patient Details  Name: [name] [name] MRN: [id number] Date of Birth: [birth year] Referring Provider (PT): Sanjuana Kava, MD   Encounter Date: 02/24/2020   PT End of Session - 02/24/20 1528    Visit Number 3    Number of Visits 16    Date for PT Re-Evaluation 04/20/20    Authorization Type FOTO; Progress note every 10th visit; KX modifier at 15th visit    PT Start Time 0314    PT Stop Time 0350    PT Time Calculation (min) 36 min    Activity Tolerance Patient tolerated treatment well    Behavior During Therapy [facility] for tasks assessed/performed           Past Medical History:  Diagnosis Date  . Anemia   . Arthritis    Ostearthritis- hips, knees, fingers  . Diabetes mellitus without complication ([place])   . DVT (deep venous thrombosis) ([place])   . Dyspnea   . GERD (gastroesophageal reflux disease)   . Headache(784.0)    tx. Valproic acid  . Hypertension   . Hypothyroidism   . Pulmonary embolism [facility])     Past Surgical History:  Procedure Laterality Date  . ABDOMINAL HYSTERECTOMY    . BIOPSY  12/08/2019   Procedure: BIOPSY;  Surgeon: Daneil Dolin, MD;  [facility];  Service: Endoscopy;;  . CATARACT EXTRACTION, BILATERAL    . COLONOSCOPY N/A 12/08/2019   polyps (tubular adenoma), diverticulosis, colonic lipoma, no surveillance due to age  . 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  [name] N/A 01/13/2020   Procedure: GIVENS CAPSULE STUDY;  Surgeon: Daneil Dolin, MD;  [facility];  Service: Endoscopy;  Laterality: N/A;  7:30am  . MULTIPLE TOOTH EXTRACTIONS     60's  . PARATHYROIDECTOMY    . POLYPECTOMY  12/08/2019   Procedure: POLYPECTOMY;  Surgeon: Daneil Dolin,  MD;  [facility];  Service: Endoscopy;;  . TOTAL HIP ARTHROPLASTY Right 10/29/2013   Procedure: RIGHT TOTAL HIP ARTHROPLASTY ANTERIOR APPROACH;  Surgeon: Mauri Pole, MD;  [facility];  Service: Orthopedics;  Laterality: Right;    There were no vitals filed for this visit.   Subjective Assessment - 02/24/20 1517    Subjective COVID 19 screening performed on patient upon arrival. Patient arrived with no pain at rest just with movement    Pertinent History closed reduction of anterior shoulder dislocation 01/22/2020, HTN, osteopenia, DM 2,    Limitations House hold activities;Lifting    Diagnostic tests x-ray: see imaging    Patient Stated Goals improve movement    Currently in Pain? Yes    Pain Score 4     Pain Location Shoulder    Pain Orientation Left    Pain Descriptors / Indicators Discomfort    Pain Type Acute pain    Pain Onset 1 to 4 weeks ago    Pain Frequency Intermittent    Aggravating Factors  movement    Pain Relieving Factors rest              [facility] PT Assessment - 02/24/20 0001      PROM   PROM Assessment Site Shoulder    Right/Left Shoulder Left    Left Shoulder Flexion 80 Degrees  Left Shoulder ABduction 60 Degrees    Left Shoulder External Rotation 4 Degrees                         [facility] Adult PT Treatment/Exercise - 02/24/20 0001      Vasopneumatic   Number Minutes Vasopneumatic  10 minutes    Vasopnuematic Location  Shoulder    Vasopneumatic Pressure Low    Vasopneumatic Temperature  34/pain      Manual Therapy   Manual Therapy Passive ROM    Manual therapy comments caution with movements    Passive ROM Gentle PROM of L shoulder into flexion, ER with gentle holds at end range                    PT Short Term Goals - 02/24/20 1529      PT SHORT TERM GOAL #1   Title Patient will be independent with HEP    Time 4    Period Weeks    Status On-going      PT SHORT TERM GOAL #2   Title Patient will  demonstrate 130+ degrees of left shoulder PROM to improve shoulder mobility    Time 4    Period Weeks    Status On-going      PT SHORT TERM GOAL #3   Title Patient will demonsrrate 45+ degrees of left shoulder PROM to improve shoulder mobility.    Time 4    Period Weeks    Status On-going             PT Long Term Goals - 02/24/20 1529      PT LONG TERM GOAL #1   Title Patient will be independent with advanced HEP    Time 8    Period Weeks    Status On-going      PT LONG TERM GOAL #2   Title Patient will demonstrate 130+ degrees of left shoulder flexion AROM to improve ability to perform overhead tasks.    Time 8    Period Weeks    Status On-going      PT LONG TERM GOAL #3   Title Patient will demonstereat 50+ degrees of left shoulder ER AROM to improve donning and doffing apparel.    Time 8    Period Weeks    Status On-going      PT LONG TERM GOAL #4   Title Patient will report ability to perform ADLs and home tasks with left shoulder pain less than or equal to 2/10.    Time 8    Period Weeks    Status On-going      PT LONG TERM GOAL #5   Title Patient will demonstrate 4/5 or greater left shoulder MMT in all planes to improve stability during functional tasks.    Time 8    Period Weeks    Status On-going                 Plan - 02/24/20 1539    Clinical Impression Statement Patient tolerated treatment well today. Patient able to tolerate PROM with ossilations to relax with guarding muscles. Minimal discomfort today and only with movements. Today used caution with all movements and patient has improved with ROM today. Patient doing HEP daily as MD and PT instructed. Goals progressing.    Personal Factors and Comorbidities Age;Comorbidity 2    Comorbidities closed reduction of left anterior shoulder dislocation 01/22/2020; HTN, DM  Examination-Activity Limitations Reach Overhead;Dressing;Lift    Examination-Participation Restrictions Cleaning;Driving     Stability/Clinical Decision Making Stable/Uncomplicated    Rehab Potential Good    PT Frequency 2x / week    PT Duration 8 weeks    PT Treatment/Interventions ADLs/Self Care Home Management;Cryotherapy;Electrical Stimulation;Iontophoresis 4mg /ml Dexamethasone;Ultrasound;Moist Heat;Neuromuscular re-education;Manual techniques;Passive range of motion;Therapeutic exercise;Therapeutic activities;Patient/family education;Vasopneumatic Device    PT Next Visit Plan PROM to left shoulder, be very cautious with shoulder extension and ER, progress to pulleys and ranger per tolerance, Modalities PRN for pain relief.    Consulted and Agree with Plan of Care Patient           Patient will benefit from skilled therapeutic intervention in order to improve the following deficits and impairments:  Decreased activity tolerance, Decreased strength, Pain, Impaired UE functional use, Postural dysfunction  Visit Diagnosis: Stiffness of left shoulder, not elsewhere classified  Acute pain of left shoulder  Muscle weakness (generalized)  Localized edema     Problem List Patient Active Problem List   Diagnosis Date Noted  . Educated about COVID-19 virus infection 02/02/2020  . Chest pain 01/03/2020  . IDA (iron deficiency anemia) 01/03/2020  . Acute on chronic blood loss anemia 12/09/2019  . Acute on chronic GI bleed on Eliquis 12/09/2019  . Iron deficiency anemia due to chronic blood loss   . Occult blood in stools   . Primary osteoarthritis of both knees 05/14/2019  . Unprovoked DVT and Unprovoked Pulmonary Embolism -Dxed 03/2019 04/03/2019  . Sciatica of right side 10/18/2018  . Seborrheic keratosis 10/17/2016  . Hyperlipidemia associated with type 2 diabetes mellitus ([place]) 10/17/2016  . Overactive bladder 10/17/2016  . Gastroesophageal reflux disease without esophagitis 10/17/2016  . Primary osteoarthritis involving multiple joints 10/17/2016  . Chronic gout without tophus 10/17/2016  . Type 2  diabetes mellitus without complication, without long-term current use of insulin ([place]) 05/16/2016  . Essential hypertension 05/16/2016  . Hypothyroidism 05/16/2016  . Symptomatic anemia 10/30/2013  . Morbid obesity ([place]) 10/30/2013  . S/P right THA, AA 10/29/2013    Ladean Raya, PTA 02/24/20 3:50 PM  [facility] [place], [place], [address] Phone: [phone]   Fax:  [phone]  Name: Staci Dack MRN: [id number] Date of Birth: 1941/02/15

## 2020-02-25 ENCOUNTER — Ambulatory Visit: Payer: Medicare Other | Admitting: Orthopaedic Surgery

## 2020-02-25 ENCOUNTER — Encounter: Payer: Self-pay | Admitting: Orthopaedic Surgery

## 2020-02-25 VITALS — BP 169/70 | HR 57 | Ht 63.0 in | Wt 208.0 lb

## 2020-02-25 DIAGNOSIS — S43015D Anterior dislocation of left humerus, subsequent encounter: Secondary | ICD-10-CM

## 2020-02-25 NOTE — Progress Notes (Signed)
Patient LD:JTTSV [name] [name], female DOB:03/07/1941, 79 y.o. XBL:[id number]  Chief Complaint  Patient presents with  . Shoulder Pain    left     HPI  [name] [name] is a 79 y.o. female who dislocated her left shoulder on June 30.  She has been to PT in [place].  I have reviewed their notes.  She is doing better. She is still in the sling.  I told her to stop the sling now.   Body mass index is 36.85 kg/m.  ROS  Review of Systems  Constitutional: Positive for activity change.  Musculoskeletal: Positive for arthralgias and joint swelling.  All other systems reviewed and are negative.   All other systems reviewed and are negative.  The following is a summary of the past history medically, past history surgically, known current medicines, social history and family history.  This information is gathered electronically by the computer from prior information and documentation.  I review this each visit and have found including this information at this point in the chart is beneficial and informative.    Past Medical History:  Diagnosis Date  . Anemia   . Arthritis    Ostearthritis- hips, knees, fingers  . Diabetes mellitus without complication ([place])   . DVT (deep venous thrombosis) ([place])   . Dyspnea   . GERD (gastroesophageal reflux disease)   . Headache(784.0)    tx. Valproic acid  . Hypertension   . Hypothyroidism   . Pulmonary embolism [facility])     Past Surgical History:  Procedure Laterality Date  . ABDOMINAL HYSTERECTOMY    . BIOPSY  12/08/2019   Procedure: BIOPSY;  Surgeon: Daneil Dolin, MD;  [facility];  Service: Endoscopy;;  . CATARACT EXTRACTION, BILATERAL    . COLONOSCOPY N/A 12/08/2019   polyps (tubular adenoma), diverticulosis, colonic lipoma, no surveillance due to age  . 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  [name] N/A  01/13/2020   Procedure: GIVENS CAPSULE STUDY;  Surgeon: Daneil Dolin, MD;  [facility];  Service: Endoscopy;  Laterality: N/A;  7:30am  . MULTIPLE TOOTH EXTRACTIONS     60's  . PARATHYROIDECTOMY    . POLYPECTOMY  12/08/2019   Procedure: POLYPECTOMY;  Surgeon: Daneil Dolin, MD;  [facility];  Service: Endoscopy;;  . TOTAL HIP ARTHROPLASTY Right 10/29/2013   Procedure: RIGHT TOTAL HIP ARTHROPLASTY ANTERIOR APPROACH;  Surgeon: Mauri Pole, MD;  [facility];  Service: Orthopedics;  Laterality: Right;    Family History  Problem Relation Age of Onset  . Colitis Sister 24       alive  . Cancer Sister 67       colon  . Cancer Mother 66       uterine  . Heart disease Father 24       heart failure  . Heart attack Brother 24  . Healthy Daughter   . Healthy Son   . Pulmonary embolism Brother 58  . Heart disease Brother   . Healthy Son   . Healthy Son     Social History Social History   Tobacco Use  . Smoking status: Never Smoker  . Smokeless tobacco: Never Used  Vaping Use  . Vaping Use: Never used  Substance Use Topics  . Alcohol use: No    Alcohol/week: 0.0 standard drinks  . Drug use: No    No Known Allergies  Current Outpatient Medications  Medication Sig Dispense Refill  . acetaminophen (TYLENOL) 325 MG tablet Take 2 tablets (650 mg total) by mouth every 6 (six) hours as needed for mild pain or headache (or Fever >/= 101). 12 tablet 0  . allopurinol (ZYLOPRIM) 100 MG tablet Take 1 tablet (100 mg total) by mouth daily. 90 tablet 1  . amLODipine (NORVASC) 5 MG tablet Take 1 tablet (5 mg total) by mouth daily. 90 tablet 1  . apixaban (ELIQUIS) 2.5 MG TABS tablet Take 1 tablet (2.5 mg total) by mouth 2 (two) times daily. 180 tablet 3  . Cholecalciferol (VITAMIN D-3) 5000 UNITS TABS Take 5,000 tablets by mouth daily.    . Colchicine 0.6 MG CAPS Take 1.2 mg (2 capsules) by mouth x1, then 0.6 mg (1 capsule) 1 hour later x1 as needed for gout  flare. 9 capsule 2  . diclofenac sodium (VOLTAREN) 1 % GEL Apply 4 g topically 4 (four) times daily. 200 g 11  . furosemide (LASIX) 20 MG tablet Take 1 tablet (20 mg total) by mouth 2 (two) times daily. 180 tablet 1  . levothyroxine (SYNTHROID) 25 MCG tablet Take 1 tablet (25 mcg total) by mouth daily before breakfast. 90 tablet 1  . loratadine (CLARITIN) 10 MG tablet Take 10 mg by mouth daily.    [name] 100 MG tablet Take 1 tablet (100 mg total) by mouth every morning. 90 tablet 1  . metFORMIN (GLUCOPHAGE) 500 MG tablet Take 1 tablet (500 mg total) by mouth 2 (two) times daily with a meal. Give w/food. 180 tablet 1  . ONE TOUCH ULTRA TEST test strip USE UP TO FOUR TIMES DAILY AS DIRECTED 400 each 3  . oxybutynin (DITROPAN) 5 MG tablet Take 1 tablet (5 mg total) by mouth 3 (three) times daily. Needs to be seen for further refills 270 tablet 0  . pantoprazole (PROTONIX) 40 MG tablet Take 1 tablet (40 mg total) by mouth 2 (two) times daily before a meal. 14 tablet 0  . simvastatin (ZOCOR) 40 MG tablet Take 1 tablet (40 mg total) by mouth every evening. Needs to be seen for further refills. 90 tablet 1  . levothyroxine (SYNTHROID) 25 MCG tablet Take 1 tablet (25 mcg total) by mouth daily before breakfast. 90 tablet 1  . losartan (COZAAR) 100 MG tablet Take 1 tablet (100 mg total) by mouth every morning. 90 tablet 1   No current facility-administered medications for this visit.     Physical Exam  Blood pressure (!) 169/70, pulse (!) 57, height 5\' 3"  (1.6 m), weight 208 lb (94.3 kg).  Constitutional: overall normal hygiene, normal nutrition, well developed, normal grooming, normal body habitus. Assistive device:sling  Musculoskeletal: gait and station Limp none, muscle tone and strength are normal, no tremors or atrophy is present.  .  Neurological: coordination overall normal.  Deep tendon reflex/nerve stretch intact.  Sensation normal.  Cranial nerves II-XII intact.   Skin:    Normal overall no scars, lesions, ulcers or rashes. No psoriasis.  Psychiatric: Alert and oriented x 3.  Recent memory intact, remote memory unclear.  Normal mood and affect. Well groomed.  Good eye contact.  Cardiovascular: overall no swelling, no varicosities, no edema bilaterally, normal temperatures of the legs and arms, no clubbing, cyanosis and good capillary refill.  Lymphatic: palpation is normal.  Left shoulder with no pain and decreased motion.  NV intact.  All other systems reviewed and are negative   The patient has been  educated about the nature of the problem(s) and counseled on treatment options.  The patient appeared to understand what I have discussed and is in agreement with it.  Encounter Diagnosis  Name Primary?  Marland Kitchen Anterior dislocation of left shoulder, subsequent encounter Yes    PLAN Call if any problems.  Precautions discussed.  Continue current medications.   Return to clinic 1 month   Continue PT.  Electronically Signed Sanjuana Kava, MD 8/3/20219:45 AM

## 2020-02-28 ENCOUNTER — Other Ambulatory Visit: Payer: Medicare Other

## 2020-02-28 ENCOUNTER — Encounter: Payer: Self-pay | Admitting: Physical Therapy

## 2020-02-28 ENCOUNTER — Ambulatory Visit: Payer: Medicare Other | Admitting: Physical Therapy

## 2020-02-28 ENCOUNTER — Other Ambulatory Visit: Payer: Self-pay

## 2020-02-28 DIAGNOSIS — R600 Localized edema: Secondary | ICD-10-CM

## 2020-02-28 DIAGNOSIS — M25512 Pain in left shoulder: Secondary | ICD-10-CM | POA: Diagnosis not present

## 2020-02-28 DIAGNOSIS — M6281 Muscle weakness (generalized): Secondary | ICD-10-CM | POA: Diagnosis not present

## 2020-02-28 DIAGNOSIS — D649 Anemia, unspecified: Secondary | ICD-10-CM | POA: Diagnosis not present

## 2020-02-28 DIAGNOSIS — M25612 Stiffness of left shoulder, not elsewhere classified: Secondary | ICD-10-CM

## 2020-02-28 NOTE — Therapy (Signed)
[facility] [place], [place], [address] Phone: [phone]   Fax:  [phone]  Physical Therapy Treatment  Patient Details  Name: [name] [name] MRN: [id number] Date of Birth: 01-Jun-1941 Referring Provider (PT): Sanjuana Kava, MD   Encounter Date: 02/28/2020   PT End of Session - 02/28/20 1035    Visit Number 4    Number of Visits 16    Date for PT Re-Evaluation 04/20/20    Authorization Type FOTO; Progress note every 10th visit; KX modifier at 15th visit    PT Start Time 1035    PT Stop Time 1114    PT Time Calculation (min) 39 min    Activity Tolerance Patient tolerated treatment well    Behavior During Therapy [facility] for tasks assessed/performed           Past Medical History:  Diagnosis Date  . Anemia   . Arthritis    Ostearthritis- hips, knees, fingers  . Diabetes mellitus without complication ([place])   . DVT (deep venous thrombosis) ([place])   . Dyspnea   . GERD (gastroesophageal reflux disease)   . Headache(784.0)    tx. Valproic acid  . Hypertension   . Hypothyroidism   . Pulmonary embolism [facility])     Past Surgical History:  Procedure Laterality Date  . ABDOMINAL HYSTERECTOMY    . BIOPSY  12/08/2019   Procedure: BIOPSY;  Surgeon: Daneil Dolin, MD;  [facility];  Service: Endoscopy;;  . CATARACT EXTRACTION, BILATERAL    . COLONOSCOPY N/A 12/08/2019   polyps (tubular adenoma), diverticulosis, colonic lipoma, no surveillance due to age  . 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  [name] N/A 01/13/2020   Procedure: GIVENS CAPSULE STUDY;  Surgeon: Daneil Dolin, MD;  [facility];  Service: Endoscopy;  Laterality: N/A;  7:30am  . MULTIPLE TOOTH EXTRACTIONS     60's  . PARATHYROIDECTOMY    . POLYPECTOMY  12/08/2019   Procedure: POLYPECTOMY;  Surgeon: Daneil Dolin,  MD;  [facility];  Service: Endoscopy;;  . TOTAL HIP ARTHROPLASTY Right 10/29/2013   Procedure: RIGHT TOTAL HIP ARTHROPLASTY ANTERIOR APPROACH;  Surgeon: Mauri Pole, MD;  [facility];  Service: Orthopedics;  Laterality: Right;    There were no vitals filed for this visit.   Subjective Assessment - 02/28/20 1034    Subjective COVID 19 screening performed on patient upon arrival. Patient arrived with no pain at rest just with movement. MD told her to get out of her sling. Has trouble with flexion, abduction.    Pertinent History closed reduction of anterior shoulder dislocation 01/22/2020, HTN, osteopenia, DM 2,    Limitations House hold activities;Lifting    Diagnostic tests x-ray: see imaging    Patient Stated Goals improve movement    Currently in Pain? No/denies              [facility] PT Assessment - 02/28/20 0001      Assessment   Medical Diagnosis Anterior dislocation of left shoulder, subsequent encounter    Referring Provider (PT) Sanjuana Kava, MD    Onset Date/Surgical Date 01/22/20    Hand Dominance Right    Next MD Visit 03/26/2020    Prior Therapy no      Precautions   Precautions Shoulder    Precaution Comments no active flexion or abduction per MD  Restrictions   Weight Bearing Restrictions No                         [facility] Adult PT Treatment/Exercise - 02/28/20 0001      Modalities   Modalities Vasopneumatic      Vasopneumatic   Number Minutes Vasopneumatic  15 minutes    Vasopnuematic Location  Shoulder    Vasopneumatic Pressure Low    Vasopneumatic Temperature  34/pain      Manual Therapy   Manual Therapy Passive ROM    Passive ROM Gentle PROM of L shoulder into flexion, ER with gentle holds at end range                    PT Short Term Goals - 02/24/20 1529      PT SHORT TERM GOAL #1   Title Patient will be independent with HEP    Time 4    Period Weeks    Status On-going      PT SHORT TERM GOAL #2    Title Patient will demonstrate 130+ degrees of left shoulder PROM to improve shoulder mobility    Time 4    Period Weeks    Status On-going      PT SHORT TERM GOAL #3   Title Patient will demonsrrate 45+ degrees of left shoulder PROM to improve shoulder mobility.    Time 4    Period Weeks    Status On-going             PT Long Term Goals - 02/24/20 1529      PT LONG TERM GOAL #1   Title Patient will be independent with advanced HEP    Time 8    Period Weeks    Status On-going      PT LONG TERM GOAL #2   Title Patient will demonstrate 130+ degrees of left shoulder flexion AROM to improve ability to perform overhead tasks.    Time 8    Period Weeks    Status On-going      PT LONG TERM GOAL #3   Title Patient will demonstereat 50+ degrees of left shoulder ER AROM to improve donning and doffing apparel.    Time 8    Period Weeks    Status On-going      PT LONG TERM GOAL #4   Title Patient will report ability to perform ADLs and home tasks with left shoulder pain less than or equal to 2/10.    Time 8    Period Weeks    Status On-going      PT LONG TERM GOAL #5   Title Patient will demonstrate 4/5 or greater left shoulder MMT in all planes to improve stability during functional tasks.    Time 8    Period Weeks    Status On-going                 Plan - 02/28/20 1119    Clinical Impression Statement Patient presented in clinic with no pain in L shoulder at rest. Patient reported 5/10 L shoulder discomfort with PROM but reported as tolerable. Patient able to tolerate more end range ROM in all directions today. Firm end feels and smooth arc of motion noted during PROM in all directions. Normal vasopneumatic response noted following removal of the modality.    Personal Factors and Comorbidities Age;Comorbidity 2    Comorbidities closed reduction of left anterior shoulder dislocation 01/22/2020;  HTN, DM    Examination-Activity Limitations Reach  Overhead;Dressing;Lift    Examination-Participation Restrictions Cleaning;Driving    Stability/Clinical Decision Making Stable/Uncomplicated    Rehab Potential Good    PT Frequency 2x / week    PT Duration 8 weeks    PT Treatment/Interventions ADLs/Self Care Home Management;Cryotherapy;Electrical Stimulation;Iontophoresis 4mg /ml Dexamethasone;Ultrasound;Moist Heat;Neuromuscular re-education;Manual techniques;Passive range of motion;Therapeutic exercise;Therapeutic activities;Patient/family education;Vasopneumatic Device    PT Next Visit Plan Progress to pulleys, UE ranger next visit.    PT Home Exercise Plan see patient education section    Consulted and Agree with Plan of Care Patient           Patient will benefit from skilled therapeutic intervention in order to improve the following deficits and impairments:  Decreased activity tolerance, Decreased strength, Pain, Impaired UE functional use, Postural dysfunction  Visit Diagnosis: Stiffness of left shoulder, not elsewhere classified  Acute pain of left shoulder  Muscle weakness (generalized)  Localized edema     Problem List Patient Active Problem List   Diagnosis Date Noted  . Educated about COVID-19 virus infection 02/02/2020  . Chest pain 01/03/2020  . IDA (iron deficiency anemia) 01/03/2020  . Acute on chronic blood loss anemia 12/09/2019  . Acute on chronic GI bleed on Eliquis 12/09/2019  . Iron deficiency anemia due to chronic blood loss   . Occult blood in stools   . Primary osteoarthritis of both knees 05/14/2019  . Unprovoked DVT and Unprovoked Pulmonary Embolism -Dxed 03/2019 04/03/2019  . Sciatica of right side 10/18/2018  . Seborrheic keratosis 10/17/2016  . Hyperlipidemia associated with type 2 diabetes mellitus ([place]) 10/17/2016  . Overactive bladder 10/17/2016  . Gastroesophageal reflux disease without esophagitis 10/17/2016  . Primary osteoarthritis involving multiple joints 10/17/2016  . Chronic gout  without tophus 10/17/2016  . Type 2 diabetes mellitus without complication, without long-term current use of insulin ([place]) 05/16/2016  . Essential hypertension 05/16/2016  . Hypothyroidism 05/16/2016  . Symptomatic anemia 10/30/2013  . Morbid obesity ([place]) 10/30/2013  . S/P right THA, AA 10/29/2013    Standley Brooking, PTA 02/28/2020, 11:30 AM  [facility] [address] [place], [place], [address] Phone: [phone]   Fax:  [phone]  Name: [name] [name] MRN: [id number] Date of Birth: [birth year]

## 2020-02-29 LAB — CBC WITH DIFFERENTIAL/PLATELET
Basophils Absolute: 0 10*3/uL (ref 0.0–0.2)
Basos: 1 %
EOS (ABSOLUTE): 0.1 10*3/uL (ref 0.0–0.4)
Eos: 2 %
Hematocrit: 31.9 % — ABNORMAL LOW (ref 34.0–46.6)
Hemoglobin: 9.5 g/dL — ABNORMAL LOW (ref 11.1–15.9)
Immature Grans (Abs): 0 10*3/uL (ref 0.0–0.1)
Immature Granulocytes: 1 %
Lymphocytes Absolute: 2.1 10*3/uL (ref 0.7–3.1)
Lymphs: 34 %
MCH: 22.5 pg — ABNORMAL LOW (ref 26.6–33.0)
MCHC: 29.8 g/dL — ABNORMAL LOW (ref 31.5–35.7)
MCV: 75 fL — ABNORMAL LOW (ref 79–97)
Monocytes Absolute: 0.7 10*3/uL (ref 0.1–0.9)
Monocytes: 12 %
Neutrophils Absolute: 3.1 10*3/uL (ref 1.4–7.0)
Neutrophils: 50 %
Platelets: 307 10*3/uL (ref 150–450)
RBC: 4.23 x10E6/uL (ref 3.77–5.28)
RDW: 21.4 % — ABNORMAL HIGH (ref 11.7–15.4)
WBC: 6.1 10*3/uL (ref 3.4–10.8)

## 2020-03-02 ENCOUNTER — Encounter: Payer: Self-pay | Admitting: Physical Therapy

## 2020-03-02 ENCOUNTER — Ambulatory Visit: Payer: Medicare Other | Admitting: Physical Therapy

## 2020-03-02 ENCOUNTER — Other Ambulatory Visit: Payer: Self-pay

## 2020-03-02 DIAGNOSIS — M25612 Stiffness of left shoulder, not elsewhere classified: Secondary | ICD-10-CM | POA: Diagnosis not present

## 2020-03-02 DIAGNOSIS — M25512 Pain in left shoulder: Secondary | ICD-10-CM | POA: Diagnosis not present

## 2020-03-02 DIAGNOSIS — M6281 Muscle weakness (generalized): Secondary | ICD-10-CM | POA: Diagnosis not present

## 2020-03-02 DIAGNOSIS — R600 Localized edema: Secondary | ICD-10-CM

## 2020-03-02 NOTE — Therapy (Signed)
[facility] [place], [place], [address] Phone: [phone]   Fax:  [phone]  Physical Therapy Treatment  Patient Details  Name: Rene Gonsoulin MRN: [id number] Date of Birth: 27-Feb-1941 Referring Provider (PT): Sanjuana Kava, MD   Encounter Date: 03/02/2020   PT End of Session - 03/02/20 1200    Visit Number 5    Number of Visits 16    Date for PT Re-Evaluation 04/20/20    Authorization Type FOTO; Progress note every 10th visit; KX modifier at 15th visit    PT Start Time 1115    PT Stop Time 1200    PT Time Calculation (min) 45 min    Equipment Utilized During Treatment Other (comment)   Quad Cane   Activity Tolerance Patient tolerated treatment well    Behavior During Therapy [facility] for tasks assessed/performed           Past Medical History:  Diagnosis Date  . Anemia   . Arthritis    Ostearthritis- hips, knees, fingers  . Diabetes mellitus without complication ([place])   . DVT (deep venous thrombosis) ([place])   . Dyspnea   . GERD (gastroesophageal reflux disease)   . Headache(784.0)    tx. Valproic acid  . Hypertension   . Hypothyroidism   . Pulmonary embolism [facility])     Past Surgical History:  Procedure Laterality Date  . ABDOMINAL HYSTERECTOMY    . BIOPSY  12/08/2019   Procedure: BIOPSY;  Surgeon: Daneil Dolin, MD;  [facility];  Service: Endoscopy;;  . CATARACT EXTRACTION, BILATERAL    . COLONOSCOPY N/A 12/08/2019   polyps (tubular adenoma), diverticulosis, colonic lipoma, no surveillance due to age  . 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  [name] N/A 01/13/2020   Procedure: GIVENS CAPSULE STUDY;  Surgeon: Daneil Dolin, MD;  [facility];  Service: Endoscopy;  Laterality: N/A;  7:30am  . MULTIPLE TOOTH EXTRACTIONS     60's  . PARATHYROIDECTOMY    .  POLYPECTOMY  12/08/2019   Procedure: POLYPECTOMY;  Surgeon: Daneil Dolin, MD;  [facility];  Service: Endoscopy;;  . TOTAL HIP ARTHROPLASTY Right 10/29/2013   Procedure: RIGHT TOTAL HIP ARTHROPLASTY ANTERIOR APPROACH;  Surgeon: Mauri Pole, MD;  Location: [facility];  Service: Orthopedics;  Laterality: Right;    There were no vitals filed for this visit.   Subjective Assessment - 03/02/20 1205    Subjective COVID 19 screening performed on patient upon arrival. Patient reports no pain just soreness. Drove herself to therapy today.    Pertinent History closed reduction of anterior shoulder dislocation 01/22/2020, HTN, osteopenia, DM 2,    Limitations House hold activities;Lifting    Diagnostic tests x-ray: see imaging    Patient Stated Goals improve movement    Currently in Pain? Yes    Pain Score 2     Pain Location Shoulder    Pain Orientation Left    Pain Descriptors / Indicators Sore    Pain Type Acute pain    Pain Onset 1 to 4 weeks ago    Pain Frequency Intermittent              [facility] PT Assessment - 03/02/20 0001      Assessment   Medical Diagnosis Anterior dislocation of left shoulder, subsequent encounter    Referring Provider (PT) Sanjuana Kava, MD    Onset  Date/Surgical Date 01/22/20    Hand Dominance Right    Next MD Visit 03/26/2020    Prior Therapy no      Precautions   Precautions Shoulder    Precaution Comments no active flexion or abduction per MD                         [facility] Adult PT Treatment/Exercise - 03/02/20 0001      Modalities   Modalities Vasopneumatic      Vasopneumatic   Number Minutes Vasopneumatic  10 minutes    Vasopnuematic Location  Shoulder    Vasopneumatic Pressure Low    Vasopneumatic Temperature  34/pain      Manual Therapy   Manual Therapy Passive ROM;Joint mobilization    Joint Mobilization gentle inferior L shoulder joint mobs to improve ROM.    Passive ROM Gentle PROM of L shoulder into flexion, ER  with gentle holds at end range; oscillations provided to decrease muscle guarding.                    PT Short Term Goals - 02/24/20 1529      PT SHORT TERM GOAL #1   Title Patient will be independent with HEP    Time 4    Period Weeks    Status On-going      PT SHORT TERM GOAL #2   Title Patient will demonstrate 130+ degrees of left shoulder PROM to improve shoulder mobility    Time 4    Period Weeks    Status On-going      PT SHORT TERM GOAL #3   Title Patient will demonsrrate 45+ degrees of left shoulder PROM to improve shoulder mobility.    Time 4    Period Weeks    Status On-going             PT Long Term Goals - 02/24/20 1529      PT LONG TERM GOAL #1   Title Patient will be independent with advanced HEP    Time 8    Period Weeks    Status On-going      PT LONG TERM GOAL #2   Title Patient will demonstrate 130+ degrees of left shoulder flexion AROM to improve ability to perform overhead tasks.    Time 8    Period Weeks    Status On-going      PT LONG TERM GOAL #3   Title Patient will demonstereat 50+ degrees of left shoulder ER AROM to improve donning and doffing apparel.    Time 8    Period Weeks    Status On-going      PT LONG TERM GOAL #4   Title Patient will report ability to perform ADLs and home tasks with left shoulder pain less than or equal to 2/10.    Time 8    Period Weeks    Status On-going      PT LONG TERM GOAL #5   Title Patient will demonstrate 4/5 or greater left shoulder MMT in all planes to improve stability during functional tasks.    Time 8    Period Weeks    Status On-going                 Plan - 03/02/20 1201    Clinical Impression Statement Patient arrived with no pain just soreness in left shoulder. Patient with smooth arcs of motion with L shoulder PROM. Intermittent oscillations  provided for muscle relaxation. Patient reported "feeling it" at end range but reports no increase of pain. Normal response to  modality upon removal.    Personal Factors and Comorbidities Age;Comorbidity 2    Comorbidities closed reduction of left anterior shoulder dislocation 01/22/2020; HTN, DM    Examination-Activity Limitations Reach Overhead;Dressing;Lift    Examination-Participation Restrictions Cleaning;Driving    Stability/Clinical Decision Making Stable/Uncomplicated    Clinical Decision Making Low    Rehab Potential Good    PT Frequency 2x / week    PT Duration 8 weeks    PT Treatment/Interventions ADLs/Self Care Home Management;Cryotherapy;Electrical Stimulation;Iontophoresis 4mg /ml Dexamethasone;Ultrasound;Moist Heat;Neuromuscular re-education;Manual techniques;Passive range of motion;Therapeutic exercise;Therapeutic activities;Patient/family education;Vasopneumatic Device    PT Next Visit Plan Progress to pulleys, UE ranger next visit.    PT Home Exercise Plan see patient education section    Consulted and Agree with Plan of Care Patient           Patient will benefit from skilled therapeutic intervention in order to improve the following deficits and impairments:  Decreased activity tolerance, Decreased strength, Pain, Impaired UE functional use, Postural dysfunction  Visit Diagnosis: Stiffness of left shoulder, not elsewhere classified  Acute pain of left shoulder  Muscle weakness (generalized)  Localized edema     Problem List Patient Active Problem List   Diagnosis Date Noted  . Educated about COVID-19 virus infection 02/02/2020  . Chest pain 01/03/2020  . IDA (iron deficiency anemia) 01/03/2020  . Acute on chronic blood loss anemia 12/09/2019  . Acute on chronic GI bleed on Eliquis 12/09/2019  . Iron deficiency anemia due to chronic blood loss   . Occult blood in stools   . Primary osteoarthritis of both knees 05/14/2019  . Unprovoked DVT and Unprovoked Pulmonary Embolism -Dxed 03/2019 04/03/2019  . Sciatica of right side 10/18/2018  . Seborrheic keratosis 10/17/2016  .  Hyperlipidemia associated with type 2 diabetes mellitus ([place]) 10/17/2016  . Overactive bladder 10/17/2016  . Gastroesophageal reflux disease without esophagitis 10/17/2016  . Primary osteoarthritis involving multiple joints 10/17/2016  . Chronic gout without tophus 10/17/2016  . Type 2 diabetes mellitus without complication, without long-term current use of insulin ([place]) 05/16/2016  . Essential hypertension 05/16/2016  . Hypothyroidism 05/16/2016  . Symptomatic anemia 10/30/2013  . Morbid obesity ([place]) 10/30/2013  . S/P right THA, AA 10/29/2013    Gabriela Eves, PT, DPT 03/02/2020, 12:06 PM  [facility] [place], [place], [address] Phone: [phone]   Fax:  [phone]  Name: Pearly Bartosik MRN: [id number] Date of Birth: Feb 20, 1941

## 2020-03-06 ENCOUNTER — Ambulatory Visit: Payer: Medicare Other | Admitting: Physical Therapy

## 2020-03-06 ENCOUNTER — Other Ambulatory Visit: Payer: Self-pay

## 2020-03-06 ENCOUNTER — Encounter: Payer: Self-pay | Admitting: Physical Therapy

## 2020-03-06 DIAGNOSIS — R600 Localized edema: Secondary | ICD-10-CM

## 2020-03-06 DIAGNOSIS — M6281 Muscle weakness (generalized): Secondary | ICD-10-CM

## 2020-03-06 DIAGNOSIS — M25612 Stiffness of left shoulder, not elsewhere classified: Secondary | ICD-10-CM

## 2020-03-06 DIAGNOSIS — M25512 Pain in left shoulder: Secondary | ICD-10-CM | POA: Diagnosis not present

## 2020-03-06 NOTE — Therapy (Signed)
[facility] [place], [place], [address] Phone: [phone]   Fax:  [phone]  Physical Therapy Treatment  Patient Details  Name: [name] [name] MRN: [id number] Date of Birth: 10/19/1940 Referring Provider (PT): Sanjuana Kava, MD   Encounter Date: 03/06/2020   PT End of Session - 03/06/20 1225    Visit Number 6    Number of Visits 16    Date for PT Re-Evaluation 04/20/20    Authorization Type FOTO; Progress note every 10th visit; KX modifier at 15th visit    PT Start Time 1030    PT Stop Time 1118    PT Time Calculation (min) 48 min    Activity Tolerance Patient tolerated treatment well    Behavior During Therapy [facility] for tasks assessed/performed           Past Medical History:  Diagnosis Date  . Anemia   . Arthritis    Ostearthritis- hips, knees, fingers  . Diabetes mellitus without complication ([place])   . DVT (deep venous thrombosis) ([place])   . Dyspnea   . GERD (gastroesophageal reflux disease)   . Headache(784.0)    tx. Valproic acid  . Hypertension   . Hypothyroidism   . Pulmonary embolism [facility])     Past Surgical History:  Procedure Laterality Date  . ABDOMINAL HYSTERECTOMY    . BIOPSY  12/08/2019   Procedure: BIOPSY;  Surgeon: Daneil Dolin, MD;  [facility];  Service: Endoscopy;;  . CATARACT EXTRACTION, BILATERAL    . COLONOSCOPY N/A 12/08/2019   polyps (tubular adenoma), diverticulosis, colonic lipoma, no surveillance due to age  . 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  [name] N/A 01/13/2020   Procedure: GIVENS CAPSULE STUDY;  Surgeon: Daneil Dolin, MD;  [facility];  Service: Endoscopy;  Laterality: N/A;  7:30am  . MULTIPLE TOOTH EXTRACTIONS     60's  . PARATHYROIDECTOMY    . POLYPECTOMY  12/08/2019   Procedure: POLYPECTOMY;  Surgeon: Daneil Dolin,  MD;  [facility];  Service: Endoscopy;;  . TOTAL HIP ARTHROPLASTY Right 10/29/2013   Procedure: RIGHT TOTAL HIP ARTHROPLASTY ANTERIOR APPROACH;  Surgeon: Mauri Pole, MD;  [facility];  Service: Orthopedics;  Laterality: Right;    There were no vitals filed for this visit.   Subjective Assessment - 03/06/20 1036    Subjective COVID 19 screening performed on patient upon arrival. Patient reports doing well no pain at rest just when she moves it the wrong way.    Pertinent History closed reduction of anterior shoulder dislocation 01/22/2020, HTN, osteopenia, DM 2,    Limitations House hold activities;Lifting    Diagnostic tests x-ray: see imaging    Patient Stated Goals improve movement    Currently in Pain? No/denies              [facility] PT Assessment - 03/06/20 0001      Assessment   Medical Diagnosis Anterior dislocation of left shoulder, subsequent encounter    Referring Provider (PT) Sanjuana Kava, MD    Onset Date/Surgical Date 01/22/20    Hand Dominance Right    Next MD Visit 03/26/2020    Prior Therapy no      Precautions   Precautions Shoulder    Precaution Comments no active flexion or abduction per MD  [facility] Adult PT Treatment/Exercise - 03/06/20 0001      Exercises   Exercises Shoulder      Shoulder Exercises: Pulleys   Flexion 5 minutes      Shoulder Exercises: ROM/Strengthening   Ranger seated into flexion, CW, and CCW x2 mins each      Modalities   Modalities Vasopneumatic      Vasopneumatic   Number Minutes Vasopneumatic  10 minutes    Vasopnuematic Location  Shoulder    Vasopneumatic Pressure Low    Vasopneumatic Temperature  34/pain      Manual Therapy   Manual Therapy Passive ROM;Joint mobilization    Joint Mobilization gentle inferior L shoulder joint mobs to improve ROM.    Passive ROM Gentle PROM of L shoulder into flexion, ER with gentle holds at end range; oscillations provided to  decrease muscle guarding.                    PT Short Term Goals - 02/24/20 1529      PT SHORT TERM GOAL #1   Title Patient will be independent with HEP    Time 4    Period Weeks    Status On-going      PT SHORT TERM GOAL #2   Title Patient will demonstrate 130+ degrees of left shoulder PROM to improve shoulder mobility    Time 4    Period Weeks    Status On-going      PT SHORT TERM GOAL #3   Title Patient will demonsrrate 45+ degrees of left shoulder PROM to improve shoulder mobility.    Time 4    Period Weeks    Status On-going             PT Long Term Goals - 02/24/20 1529      PT LONG TERM GOAL #1   Title Patient will be independent with advanced HEP    Time 8    Period Weeks    Status On-going      PT LONG TERM GOAL #2   Title Patient will demonstrate 130+ degrees of left shoulder flexion AROM to improve ability to perform overhead tasks.    Time 8    Period Weeks    Status On-going      PT LONG TERM GOAL #3   Title Patient will demonstereat 50+ degrees of left shoulder ER AROM to improve donning and doffing apparel.    Time 8    Period Weeks    Status On-going      PT LONG TERM GOAL #4   Title Patient will report ability to perform ADLs and home tasks with left shoulder pain less than or equal to 2/10.    Time 8    Period Weeks    Status On-going      PT LONG TERM GOAL #5   Title Patient will demonstrate 4/5 or greater left shoulder MMT in all planes to improve stability during functional tasks.    Time 8    Period Weeks    Status On-going                 Plan - 03/06/20 1225    Clinical Impression Statement Patient responded well to the progression of AAROM TEs to left shoulder. Patient cautious with pulleys and was able to achieve about 85-90 degrees of AAROM flexion. Seated ranger initiated with good response. PROM with smooth arcs of motion. Normal response to modalities upon removal.  Personal Factors and Comorbidities  Age;Comorbidity 2    Comorbidities closed reduction of left anterior shoulder dislocation 01/22/2020; HTN, DM    Examination-Activity Limitations Reach Overhead;Dressing;Lift    Examination-Participation Restrictions Cleaning;Driving    Stability/Clinical Decision Making Stable/Uncomplicated    Clinical Decision Making Low    Rehab Potential Good    PT Frequency 2x / week    PT Duration 8 weeks    PT Treatment/Interventions ADLs/Self Care Home Management;Cryotherapy;Electrical Stimulation;Iontophoresis 4mg /ml Dexamethasone;Ultrasound;Moist Heat;Neuromuscular re-education;Manual techniques;Passive range of motion;Therapeutic exercise;Therapeutic activities;Patient/family education;Vasopneumatic Device    PT Next Visit Plan Progress to pulleys, UE ranger next visit, modalities PRN    PT Home Exercise Plan see patient education section    Consulted and Agree with Plan of Care Patient           Patient will benefit from skilled therapeutic intervention in order to improve the following deficits and impairments:  Decreased activity tolerance, Decreased strength, Pain, Impaired UE functional use, Postural dysfunction  Visit Diagnosis: Stiffness of left shoulder, not elsewhere classified  Acute pain of left shoulder  Muscle weakness (generalized)  Localized edema     Problem List Patient Active Problem List   Diagnosis Date Noted  . Educated about COVID-19 virus infection 02/02/2020  . Chest pain 01/03/2020  . IDA (iron deficiency anemia) 01/03/2020  . Acute on chronic blood loss anemia 12/09/2019  . Acute on chronic GI bleed on Eliquis 12/09/2019  . Iron deficiency anemia due to chronic blood loss   . Occult blood in stools   . Primary osteoarthritis of both knees 05/14/2019  . Unprovoked DVT and Unprovoked Pulmonary Embolism -Dxed 03/2019 04/03/2019  . Sciatica of right side 10/18/2018  . Seborrheic keratosis 10/17/2016  . Hyperlipidemia associated with type 2 diabetes mellitus  ([place]) 10/17/2016  . Overactive bladder 10/17/2016  . Gastroesophageal reflux disease without esophagitis 10/17/2016  . Primary osteoarthritis involving multiple joints 10/17/2016  . Chronic gout without tophus 10/17/2016  . Type 2 diabetes mellitus without complication, without long-term current use of insulin ([place]) 05/16/2016  . Essential hypertension 05/16/2016  . Hypothyroidism 05/16/2016  . Symptomatic anemia 10/30/2013  . Morbid obesity ([place]) 10/30/2013  . S/P right THA, AA 10/29/2013    Gabriela Eves, PT, DPT 03/06/2020, 12:29 PM  [facility] [place], [place], [address] Phone: [phone]   Fax:  [phone]  Name: [name] [name] MRN: [id number] Date of Birth: 08-24-1940

## 2020-03-09 ENCOUNTER — Encounter: Payer: Self-pay | Admitting: Physical Therapy

## 2020-03-09 ENCOUNTER — Ambulatory Visit: Payer: Medicare Other | Admitting: Physical Therapy

## 2020-03-09 ENCOUNTER — Other Ambulatory Visit: Payer: Self-pay

## 2020-03-09 DIAGNOSIS — M25612 Stiffness of left shoulder, not elsewhere classified: Secondary | ICD-10-CM | POA: Diagnosis not present

## 2020-03-09 DIAGNOSIS — M25512 Pain in left shoulder: Secondary | ICD-10-CM | POA: Diagnosis not present

## 2020-03-09 DIAGNOSIS — M6281 Muscle weakness (generalized): Secondary | ICD-10-CM | POA: Diagnosis not present

## 2020-03-09 DIAGNOSIS — R600 Localized edema: Secondary | ICD-10-CM

## 2020-03-09 NOTE — Therapy (Signed)
[facility] [place], [place], [address] Phone: [phone]   Fax:  [phone]  Physical Therapy Treatment  Patient Details  Name: [name] [name] MRN: [id number] Date of Birth: 1940/12/28 Referring Provider (PT): Sanjuana Kava, MD   Encounter Date: 03/09/2020   PT End of Session - 03/09/20 0820    Visit Number 7    Number of Visits 16    Date for PT Re-Evaluation 04/20/20    Authorization Type FOTO; Progress note every 10th visit; KX modifier at 15th visit    PT Start Time 0815    PT Stop Time 0900    PT Time Calculation (min) 45 min    Activity Tolerance Patient tolerated treatment well    Behavior During Therapy [facility] for tasks assessed/performed           Past Medical History:  Diagnosis Date   Anemia    Arthritis    Ostearthritis- hips, knees, fingers   Diabetes mellitus without complication (HCC)    DVT (deep venous thrombosis) (HCC)    Dyspnea    GERD (gastroesophageal reflux disease)    Headache(784.0)    tx. Valproic acid   Hypertension    Hypothyroidism    Pulmonary embolism [facility])     Past Surgical History:  Procedure Laterality Date   ABDOMINAL HYSTERECTOMY     BIOPSY  12/08/2019   Procedure: BIOPSY;  Surgeon: Daneil Dolin, MD;  [facility];  Service: Endoscopy;;   CATARACT EXTRACTION, BILATERAL     COLONOSCOPY N/A 12/08/2019   polyps (tubular adenoma), diverticulosis, colonic lipoma, no surveillance due to age   ESOPHAGOGASTRODUODENOSCOPY N/A 12/08/2019   normal esophagus with 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   GIVENS CAPSULE STUDY N/A 01/13/2020   Procedure: GIVENS CAPSULE STUDY;  Surgeon: Daneil Dolin, MD;  [facility];  Service: Endoscopy;  Laterality: N/A;  7:30am   MULTIPLE TOOTH EXTRACTIONS     60's   PARATHYROIDECTOMY     POLYPECTOMY  12/08/2019   Procedure: POLYPECTOMY;  Surgeon: Daneil Dolin,  MD;  [facility];  Service: Endoscopy;;   TOTAL HIP ARTHROPLASTY Right 10/29/2013   Procedure: RIGHT TOTAL HIP ARTHROPLASTY ANTERIOR APPROACH;  Surgeon: Mauri Pole, MD;  [facility]: [facility];  Service: Orthopedics;  Laterality: Right;    There were no vitals filed for this visit.   Subjective Assessment - 03/09/20 0818    Subjective COVID 19 screening performed on patient upon arrival. Reports small amount of discomfort with activity.    Pertinent History closed reduction of anterior shoulder dislocation 01/22/2020, HTN, osteopenia, DM 2,    Limitations House hold activities;Lifting    Diagnostic tests x-ray: see imaging    Patient Stated Goals improve movement    Currently in Pain? Yes    Pain Score 3     Pain Location Shoulder    Pain Orientation Left    Pain Descriptors / Indicators Discomfort    Pain Type Acute pain    Pain Onset More than a month ago    Pain Frequency Intermittent              [facility] PT Assessment - 03/09/20 0001      Assessment   Medical Diagnosis Anterior dislocation of left shoulder, subsequent encounter    Referring Provider (PT) Sanjuana Kava, MD    Onset Date/Surgical Date 01/22/20    Hand Dominance Right    Next MD Visit 03/26/2020  Prior Therapy no      Precautions   Precautions Shoulder    Precaution Comments no active flexion or abduction per MD                         [facility] Adult PT Treatment/Exercise - 03/09/20 0001      Shoulder Exercises: Pulleys   Flexion 5 minutes      Shoulder Exercises: ROM/Strengthening   Ranger seated into flexion, CW, and CCW x3 mins each      Shoulder Exercises: Isometric Strengthening   Extension 5X5"    External Rotation 5X5"    Internal Rotation 5X5"      Modalities   Modalities Vasopneumatic      Vasopneumatic   Number Minutes Vasopneumatic  10 minutes    Vasopnuematic Location  Shoulder    Vasopneumatic Pressure Low    Vasopneumatic Temperature  34/pain      Manual  Therapy   Manual Therapy Passive ROM    Passive ROM Gentle PROM of L shoulder into flexion, ER with gentle holds at end range                    PT Short Term Goals - 02/24/20 1529      PT SHORT TERM GOAL #1   Title Patient will be independent with HEP    Time 4    Period Weeks    Status On-going      PT SHORT TERM GOAL #2   Title Patient will demonstrate 130+ degrees of left shoulder PROM to improve shoulder mobility    Time 4    Period Weeks    Status On-going      PT SHORT TERM GOAL #3   Title Patient will demonsrrate 45+ degrees of left shoulder PROM to improve shoulder mobility.    Time 4    Period Weeks    Status On-going             PT Long Term Goals - 02/24/20 1529      PT LONG TERM GOAL #1   Title Patient will be independent with advanced HEP    Time 8    Period Weeks    Status On-going      PT LONG TERM GOAL #2   Title Patient will demonstrate 130+ degrees of left shoulder flexion AROM to improve ability to perform overhead tasks.    Time 8    Period Weeks    Status On-going      PT LONG TERM GOAL #3   Title Patient will demonstereat 50+ degrees of left shoulder ER AROM to improve donning and doffing apparel.    Time 8    Period Weeks    Status On-going      PT LONG TERM GOAL #4   Title Patient will report ability to perform ADLs and home tasks with left shoulder pain less than or equal to 2/10.    Time 8    Period Weeks    Status On-going      PT LONG TERM GOAL #5   Title Patient will demonstrate 4/5 or greater left shoulder MMT in all planes to improve stability during functional tasks.    Time 8    Period Weeks    Status On-going                 Plan - 03/09/20 0855    Clinical Impression Statement Patient presented in clinic  with some progression of AAROM and gentle introduction of gentle isometrics. Patient able to tolerate therex fairly well without any complaint of pain. Firm end feels and smooth arc of motion noted  during PROM of L shoulder. Normal vasopneumatic response noted following removal of the modality.    Personal Factors and Comorbidities Age;Comorbidity 2    Comorbidities closed reduction of left anterior shoulder dislocation 01/22/2020; HTN, DM    Examination-Activity Limitations Reach Overhead;Dressing;Lift    Examination-Participation Restrictions Cleaning;Driving    Stability/Clinical Decision Making Stable/Uncomplicated    Rehab Potential Good    PT Frequency 2x / week    PT Duration 8 weeks    PT Treatment/Interventions ADLs/Self Care Home Management;Cryotherapy;Electrical Stimulation;Iontophoresis 4mg /ml Dexamethasone;Ultrasound;Moist Heat;Neuromuscular re-education;Manual techniques;Passive range of motion;Therapeutic exercise;Therapeutic activities;Patient/family education;Vasopneumatic Device    PT Next Visit Plan Progress to pulleys, UE ranger next visit, modalities PRN    PT Home Exercise Plan see patient education section    Consulted and Agree with Plan of Care Patient           Patient will benefit from skilled therapeutic intervention in order to improve the following deficits and impairments:  Decreased activity tolerance, Decreased strength, Pain, Impaired UE functional use, Postural dysfunction  Visit Diagnosis: Stiffness of left shoulder, not elsewhere classified  Acute pain of left shoulder  Muscle weakness (generalized)  Localized edema     Problem List Patient Active Problem List   Diagnosis Date Noted   Educated about COVID-19 virus infection 02/02/2020   Chest pain 01/03/2020   IDA (iron deficiency anemia) 01/03/2020   Acute on chronic blood loss anemia 12/09/2019   Acute on chronic GI bleed on Eliquis 12/09/2019   Iron deficiency anemia due to chronic blood loss    Occult blood in stools    Primary osteoarthritis of both knees 05/14/2019   Unprovoked DVT and Unprovoked Pulmonary Embolism -Dxed 03/2019 04/03/2019   Sciatica of right side  10/18/2018   Seborrheic keratosis 10/17/2016   Hyperlipidemia associated with type 2 diabetes mellitus ([place]) 10/17/2016   Overactive bladder 10/17/2016   Gastroesophageal reflux disease without esophagitis 10/17/2016   Primary osteoarthritis involving multiple joints 10/17/2016   Chronic gout without tophus 10/17/2016   Type 2 diabetes mellitus without complication, without long-term current use of insulin ([place]) 05/16/2016   Essential hypertension 05/16/2016   Hypothyroidism 05/16/2016   Symptomatic anemia 10/30/2013   Morbid obesity ([place]) 10/30/2013   S/P right THA, AA 10/29/2013    Standley Brooking, PTA 03/09/2020, 9:17 AM  [facility] [address] [place], [place], [address] Phone: [phone]   Fax:  [phone]  Name: Ricki Vanhandel MRN: [id number] Date of Birth: 12/06/1940

## 2020-03-12 ENCOUNTER — Telehealth: Payer: Self-pay | Admitting: Gastroenterology

## 2020-03-12 ENCOUNTER — Other Ambulatory Visit: Payer: Self-pay

## 2020-03-12 DIAGNOSIS — D509 Iron deficiency anemia, unspecified: Secondary | ICD-10-CM

## 2020-03-12 NOTE — Telephone Encounter (Signed)
[name] [name], can you call patient to remind her about need to call to arrange CTE. She cancelled the original CTE and was to call central scheduling at [phone] to reschedule.

## 2020-03-12 NOTE — Addendum Note (Signed)
Addended by: Derrick Ravel on: 2/64/1583 09:40 PM   Modules accepted: Orders

## 2020-03-13 ENCOUNTER — Ambulatory Visit: Payer: Medicare Other | Admitting: *Deleted

## 2020-03-13 ENCOUNTER — Other Ambulatory Visit: Payer: Self-pay

## 2020-03-13 DIAGNOSIS — R600 Localized edema: Secondary | ICD-10-CM

## 2020-03-13 DIAGNOSIS — M25612 Stiffness of left shoulder, not elsewhere classified: Secondary | ICD-10-CM

## 2020-03-13 DIAGNOSIS — M25512 Pain in left shoulder: Secondary | ICD-10-CM | POA: Diagnosis not present

## 2020-03-13 DIAGNOSIS — M6281 Muscle weakness (generalized): Secondary | ICD-10-CM | POA: Diagnosis not present

## 2020-03-13 NOTE — Therapy (Signed)
[facility] [place], [place], [address] Phone: [phone]   Fax:  [phone]  Physical Therapy Treatment  Patient Details  Name: Daizee Firmin MRN: [id number] Date of Birth: [birth year] Referring Provider (PT): Sanjuana Kava, MD   Encounter Date: 03/13/2020   PT End of Session - 03/13/20 1047    Visit Number 8    Number of Visits 16    Date for PT Re-Evaluation 04/20/20    Authorization Type FOTO; Progress note every 10th visit; KX modifier at 15th visit    PT Start Time 1035    PT Stop Time 1126    PT Time Calculation (min) 51 min           Past Medical History:  Diagnosis Date  . Anemia   . Arthritis    Ostearthritis- hips, knees, fingers  . Diabetes mellitus without complication ([place])   . DVT (deep venous thrombosis) ([place])   . Dyspnea   . GERD (gastroesophageal reflux disease)   . Headache(784.0)    tx. Valproic acid  . Hypertension   . Hypothyroidism   . Pulmonary embolism [facility])     Past Surgical History:  Procedure Laterality Date  . ABDOMINAL HYSTERECTOMY    . BIOPSY  12/08/2019   Procedure: BIOPSY;  Surgeon: Daneil Dolin, MD;  [facility];  Service: Endoscopy;;  . CATARACT EXTRACTION, BILATERAL    . COLONOSCOPY N/A 12/08/2019   polyps (tubular adenoma), diverticulosis, colonic lipoma, no surveillance due to age  . 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  [name] N/A 01/13/2020   Procedure: GIVENS CAPSULE STUDY;  Surgeon: Daneil Dolin, MD;  [facility];  Service: Endoscopy;  Laterality: N/A;  7:30am  . MULTIPLE TOOTH EXTRACTIONS     60's  . PARATHYROIDECTOMY    . POLYPECTOMY  12/08/2019   Procedure: POLYPECTOMY;  Surgeon: Daneil Dolin, MD;  [facility];  Service: Endoscopy;;  . TOTAL HIP ARTHROPLASTY Right 10/29/2013   Procedure: RIGHT  TOTAL HIP ARTHROPLASTY ANTERIOR APPROACH;  Surgeon: Mauri Pole, MD;  [facility];  Service: Orthopedics;  Laterality: Right;    There were no vitals filed for this visit.   Subjective Assessment - 03/13/20 1049    Subjective COVID 19 screening performed on patient upon arrival. LT shldr did ok after last Rx    Pertinent History closed reduction of anterior shoulder dislocation 01/22/2020, HTN, osteopenia, DM 2,    Limitations House hold activities;Lifting    Diagnostic tests x-ray: see imaging    Patient Stated Goals improve movement    Currently in Pain? Yes    Pain Score 3     Pain Location Shoulder    Pain Orientation Left    Pain Descriptors / Indicators Discomfort    Pain Type Acute pain    Pain Onset More than a month ago                             [facility] Adult PT Treatment/Exercise - 03/13/20 0001      Exercises   Exercises Shoulder      Shoulder Exercises: Pulleys   Flexion 5 minutes      Shoulder Exercises: ROM/Strengthening   Ranger seated into flexion, CW, and CCW x2 mins each      Modalities   Modalities Vasopneumatic  Vasopneumatic   Number Minutes Vasopneumatic  10 minutes    Vasopnuematic Location  Shoulder    Vasopneumatic Pressure Low    Vasopneumatic Temperature  34/pain      Manual Therapy   Manual Therapy Passive ROM    Passive ROM Gentle PROM of L shoulder into  ER with gentle holds at end range. Rhythmic stab for IR/ER with 10 sec holds                    PT Short Term Goals - 02/24/20 1529      PT SHORT TERM GOAL #1   Title Patient will be independent with HEP    Time 4    Period Weeks    Status On-going      PT SHORT TERM GOAL #2   Title Patient will demonstrate 130+ degrees of left shoulder PROM to improve shoulder mobility    Time 4    Period Weeks    Status On-going      PT SHORT TERM GOAL #3   Title Patient will demonsrrate 45+ degrees of left shoulder PROM to improve shoulder mobility.     Time 4    Period Weeks    Status On-going             PT Long Term Goals - 02/24/20 1529      PT LONG TERM GOAL #1   Title Patient will be independent with advanced HEP    Time 8    Period Weeks    Status On-going      PT LONG TERM GOAL #2   Title Patient will demonstrate 130+ degrees of left shoulder flexion AROM to improve ability to perform overhead tasks.    Time 8    Period Weeks    Status On-going      PT LONG TERM GOAL #3   Title Patient will demonstereat 50+ degrees of left shoulder ER AROM to improve donning and doffing apparel.    Time 8    Period Weeks    Status On-going      PT LONG TERM GOAL #4   Title Patient will report ability to perform ADLs and home tasks with left shoulder pain less than or equal to 2/10.    Time 8    Period Weeks    Status On-going      PT LONG TERM GOAL #5   Title Patient will demonstrate 4/5 or greater left shoulder MMT in all planes to improve stability during functional tasks.    Time 8    Period Weeks    Status On-going                 Plan - 03/13/20 1047    Clinical Impression Statement Pt arrived today reporting that she feels her LT shldr is doing better with less pain and more motion. She did well with AAROM exs as well as PROM and rhythmic stab. ER to 50 degrees today and normal Vaso response.    Personal Factors and Comorbidities Age;Comorbidity 2    Comorbidities closed reduction of left anterior shoulder dislocation 01/22/2020; HTN, DM    Examination-Activity Limitations Reach Overhead;Dressing;Lift    Examination-Participation Restrictions Cleaning;Driving    Stability/Clinical Decision Making Stable/Uncomplicated    PT Frequency 2x / week    PT Duration 8 weeks    PT Treatment/Interventions ADLs/Self Care Home Management;Cryotherapy;Electrical Stimulation;Iontophoresis 4mg /ml Dexamethasone;Ultrasound;Moist Heat;Neuromuscular re-education;Manual techniques;Passive range of motion;Therapeutic  exercise;Therapeutic activities;Patient/family education;Vasopneumatic Device  PT Next Visit Plan Progress to pulleys, UE ranger next visit, modalities PRN           Patient will benefit from skilled therapeutic intervention in order to improve the following deficits and impairments:  Decreased activity tolerance, Decreased strength, Pain, Impaired UE functional use, Postural dysfunction  Visit Diagnosis: Stiffness of left shoulder, not elsewhere classified  Acute pain of left shoulder  Muscle weakness (generalized)  Localized edema     Problem List Patient Active Problem List   Diagnosis Date Noted  . Educated about COVID-19 virus infection 02/02/2020  . Chest pain 01/03/2020  . IDA (iron deficiency anemia) 01/03/2020  . Acute on chronic blood loss anemia 12/09/2019  . Acute on chronic GI bleed on Eliquis 12/09/2019  . Iron deficiency anemia due to chronic blood loss   . Occult blood in stools   . Primary osteoarthritis of both knees 05/14/2019  . Unprovoked DVT and Unprovoked Pulmonary Embolism -Dxed 03/2019 04/03/2019  . Sciatica of right side 10/18/2018  . Seborrheic keratosis 10/17/2016  . Hyperlipidemia associated with type 2 diabetes mellitus ([place]) 10/17/2016  . Overactive bladder 10/17/2016  . Gastroesophageal reflux disease without esophagitis 10/17/2016  . Primary osteoarthritis involving multiple joints 10/17/2016  . Chronic gout without tophus 10/17/2016  . Type 2 diabetes mellitus without complication, without long-term current use of insulin ([place]) 05/16/2016  . Essential hypertension 05/16/2016  . Hypothyroidism 05/16/2016  . Symptomatic anemia 10/30/2013  . Morbid obesity ([place]) 10/30/2013  . S/P right THA, AA 10/29/2013    Hanish Laraia,CHRIS , PTA 03/13/2020, 11:29 AM  [facility] [address] [place], [place], [address] Phone: [phone]   Fax:  [phone]  Name: Ritamarie Arkin MRN: [id number] Date  of Birth: Nov 13, 1940

## 2020-03-13 NOTE — Telephone Encounter (Signed)
Called pt. Mailbox is full. Will call pt back.

## 2020-03-16 ENCOUNTER — Encounter: Payer: Self-pay | Admitting: Physical Therapy

## 2020-03-16 ENCOUNTER — Ambulatory Visit: Payer: Medicare Other | Admitting: Physical Therapy

## 2020-03-16 DIAGNOSIS — R600 Localized edema: Secondary | ICD-10-CM

## 2020-03-16 DIAGNOSIS — M25512 Pain in left shoulder: Secondary | ICD-10-CM | POA: Diagnosis not present

## 2020-03-16 DIAGNOSIS — M25612 Stiffness of left shoulder, not elsewhere classified: Secondary | ICD-10-CM | POA: Diagnosis not present

## 2020-03-16 DIAGNOSIS — M6281 Muscle weakness (generalized): Secondary | ICD-10-CM | POA: Diagnosis not present

## 2020-03-16 NOTE — Therapy (Signed)
[facility] [place], [place], [address] Phone: [phone]   Fax:  [phone]  Physical Therapy Treatment  Patient Details  Name: [name] [name] MRN: [id number] Date of Birth: 1941/02/02 Referring Provider (PT): Sanjuana Kava, MD   Encounter Date: 03/16/2020   PT End of Session - 03/16/20 2046    Visit Number 9    Number of Visits 16    Authorization Type FOTO; Progress note every 10th visit; KX modifier at 15th visit    PT Start Time 1430    PT Stop Time 1514    PT Time Calculation (min) 44 min    Activity Tolerance Patient tolerated treatment well    Behavior During Therapy [facility] for tasks assessed/performed           Past Medical History:  Diagnosis Date  . Anemia   . Arthritis    Ostearthritis- hips, knees, fingers  . Diabetes mellitus without complication ([place])   . DVT (deep venous thrombosis) ([place])   . Dyspnea   . GERD (gastroesophageal reflux disease)   . Headache(784.0)    tx. Valproic acid  . Hypertension   . Hypothyroidism   . Pulmonary embolism [facility])     Past Surgical History:  Procedure Laterality Date  . ABDOMINAL HYSTERECTOMY    . BIOPSY  12/08/2019   Procedure: BIOPSY;  Surgeon: Daneil Dolin, MD;  [facility];  Service: Endoscopy;;  . CATARACT EXTRACTION, BILATERAL    . COLONOSCOPY N/A 12/08/2019   polyps (tubular adenoma), diverticulosis, colonic lipoma, no surveillance due to age  . 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  [name] N/A 01/13/2020   Procedure: GIVENS CAPSULE STUDY;  Surgeon: Daneil Dolin, MD;  [facility];  Service: Endoscopy;  Laterality: N/A;  7:30am  . MULTIPLE TOOTH EXTRACTIONS     60's  . PARATHYROIDECTOMY    . POLYPECTOMY  12/08/2019   Procedure: POLYPECTOMY;  Surgeon: Daneil Dolin, MD;  [facility];   Service: Endoscopy;;  . TOTAL HIP ARTHROPLASTY Right 10/29/2013   Procedure: RIGHT TOTAL HIP ARTHROPLASTY ANTERIOR APPROACH;  Surgeon: Mauri Pole, MD;  [facility];  Service: Orthopedics;  Laterality: Right;    There were no vitals filed for this visit.   Subjective Assessment - 03/16/20 1437    Subjective COVID 19 screening performed on patient upon arrival. Reports 1-2/10 pain in left shoulder but overall doing well.    Pertinent History closed reduction of anterior shoulder dislocation 01/22/2020, HTN, osteopenia, DM 2,    Limitations House hold activities;Lifting    Diagnostic tests x-ray: see imaging    Patient Stated Goals improve movement    Currently in Pain? Yes    Pain Score 2     Pain Location Shoulder    Pain Orientation Left    Pain Descriptors / Indicators Discomfort    Pain Type Acute pain    Pain Onset More than a month ago    Pain Frequency Intermittent              [facility] PT Assessment - 03/16/20 0001      Assessment   Medical Diagnosis Anterior dislocation of left shoulder, subsequent encounter    Referring Provider (PT) Sanjuana Kava, MD    Onset Date/Surgical Date 01/22/20    Hand Dominance Right    Next MD Visit 03/26/2020    Prior Therapy  no      Precautions   Precautions Shoulder    Precaution Comments no active flexion or abduction per MD                         [facility] Adult PT Treatment/Exercise - 03/16/20 0001      Exercises   Exercises Shoulder      Shoulder Exercises: Pulleys   Flexion 5 minutes      Shoulder Exercises: ROM/Strengthening   Ranger seated into flexion, CW, and CCW x3 mins each      Modalities   Modalities Vasopneumatic      Vasopneumatic   Number Minutes Vasopneumatic  10 minutes    Vasopnuematic Location  Shoulder    Vasopneumatic Pressure Low    Vasopneumatic Temperature  34/pain      Manual Therapy   Manual Therapy Passive ROM    Passive ROM Gentle PROM of L shoulder into  ER with gentle  holds at end range. Rhythmic stab for IR/ER with 10 sec holds                    PT Short Term Goals - 02/24/20 1529      PT SHORT TERM GOAL #1   Title Patient will be independent with HEP    Time 4    Period Weeks    Status On-going      PT SHORT TERM GOAL #2   Title Patient will demonstrate 130+ degrees of left shoulder PROM to improve shoulder mobility    Time 4    Period Weeks    Status On-going      PT SHORT TERM GOAL #3   Title Patient will demonsrrate 45+ degrees of left shoulder PROM to improve shoulder mobility.    Time 4    Period Weeks    Status On-going             PT Long Term Goals - 02/24/20 1529      PT LONG TERM GOAL #1   Title Patient will be independent with advanced HEP    Time 8    Period Weeks    Status On-going      PT LONG TERM GOAL #2   Title Patient will demonstrate 130+ degrees of left shoulder flexion AROM to improve ability to perform overhead tasks.    Time 8    Period Weeks    Status On-going      PT LONG TERM GOAL #3   Title Patient will demonstereat 50+ degrees of left shoulder ER AROM to improve donning and doffing apparel.    Time 8    Period Weeks    Status On-going      PT LONG TERM GOAL #4   Title Patient will report ability to perform ADLs and home tasks with left shoulder pain less than or equal to 2/10.    Time 8    Period Weeks    Status On-going      PT LONG TERM GOAL #5   Title Patient will demonstrate 4/5 or greater left shoulder MMT in all planes to improve stability during functional tasks.    Time 8    Period Weeks    Status On-going                 Plan - 03/16/20 2047    Clinical Impression Statement Patient arrives to physical therapy with low levels of left shoulder pain. Patient  responded well to [facility] activities. Smooth arcs of motion noted with PROM. Rhythmic stabilization for IR/ER provided with a good response. No adverse effects upon removal of vasopneumatic device.    Personal  Factors and Comorbidities Age;Comorbidity 2    Comorbidities closed reduction of left anterior shoulder dislocation 01/22/2020; HTN, DM    Examination-Activity Limitations Reach Overhead;Dressing;Lift    Examination-Participation Restrictions Cleaning;Driving    Stability/Clinical Decision Making Stable/Uncomplicated    Clinical Decision Making Low    Rehab Potential Good    PT Frequency 2x / week    PT Duration 8 weeks    PT Treatment/Interventions ADLs/Self Care Home Management;Cryotherapy;Electrical Stimulation;Iontophoresis 4mg /ml Dexamethasone;Ultrasound;Moist Heat;Neuromuscular re-education;Manual techniques;Passive range of motion;Therapeutic exercise;Therapeutic activities;Patient/family education;Vasopneumatic Device    PT Next Visit Plan cont with pulleys and AAROM, avoiding active flexion and abd at this time, modalities PRN    PT Home Exercise Plan see patient education section    Consulted and Agree with Plan of Care Patient           Patient will benefit from skilled therapeutic intervention in order to improve the following deficits and impairments:  Decreased activity tolerance, Decreased strength, Pain, Impaired UE functional use, Postural dysfunction  Visit Diagnosis: Stiffness of left shoulder, not elsewhere classified  Acute pain of left shoulder  Muscle weakness (generalized)  Localized edema     Problem List Patient Active Problem List   Diagnosis Date Noted  . Educated about COVID-19 virus infection 02/02/2020  . Chest pain 01/03/2020  . IDA (iron deficiency anemia) 01/03/2020  . Acute on chronic blood loss anemia 12/09/2019  . Acute on chronic GI bleed on Eliquis 12/09/2019  . Iron deficiency anemia due to chronic blood loss   . Occult blood in stools   . Primary osteoarthritis of both knees 05/14/2019  . Unprovoked DVT and Unprovoked Pulmonary Embolism -Dxed 03/2019 04/03/2019  . Sciatica of right side 10/18/2018  . Seborrheic keratosis 10/17/2016   . Hyperlipidemia associated with type 2 diabetes mellitus ([place]) 10/17/2016  . Overactive bladder 10/17/2016  . Gastroesophageal reflux disease without esophagitis 10/17/2016  . Primary osteoarthritis involving multiple joints 10/17/2016  . Chronic gout without tophus 10/17/2016  . Type 2 diabetes mellitus without complication, without long-term current use of insulin ([place]) 05/16/2016  . Essential hypertension 05/16/2016  . Hypothyroidism 05/16/2016  . Symptomatic anemia 10/30/2013  . Morbid obesity ([place]) 10/30/2013  . S/P right THA, AA 10/29/2013    Gabriela Eves, PT, DPT 03/16/2020, 8:51 PM  [facility] [place], [place], [address] Phone: [phone]   Fax:  [phone]  Name: [name] [name] MRN: [id number] Date of Birth: Sep 16, 1940

## 2020-03-18 NOTE — Telephone Encounter (Signed)
Letter was mailed to pt after several attempts.

## 2020-03-19 ENCOUNTER — Ambulatory Visit: Payer: Medicare Other | Admitting: Physical Therapy

## 2020-03-19 ENCOUNTER — Other Ambulatory Visit: Payer: Self-pay

## 2020-03-19 DIAGNOSIS — M25512 Pain in left shoulder: Secondary | ICD-10-CM

## 2020-03-19 DIAGNOSIS — R600 Localized edema: Secondary | ICD-10-CM | POA: Diagnosis not present

## 2020-03-19 DIAGNOSIS — M6281 Muscle weakness (generalized): Secondary | ICD-10-CM

## 2020-03-19 DIAGNOSIS — M25612 Stiffness of left shoulder, not elsewhere classified: Secondary | ICD-10-CM

## 2020-03-19 NOTE — Therapy (Signed)
[facility] [place], [place], [address] Phone: [phone]   Fax:  [phone]  Physical Therapy Treatment  Patient Details  Name: [name] [name] MRN: [id number] Date of Birth: 31-Aug-1940 Referring Provider (PT): Sanjuana Kava, MD   Encounter Date: 03/19/2020   PT End of Session - 03/19/20 1401    Visit Number 10    Number of Visits 16    Date for PT Re-Evaluation 04/20/20    Authorization Type FOTO; Progress note every 10th visit; KX modifier at 15th visit    PT Start Time 0101    PT Stop Time 0146    PT Time Calculation (min) 45 min    Activity Tolerance Patient tolerated treatment well    Behavior During Therapy [facility] for tasks assessed/performed           Past Medical History:  Diagnosis Date   Anemia    Arthritis    Ostearthritis- hips, knees, fingers   Diabetes mellitus without complication ([facility])    DVT (deep venous thrombosis) ([facility])    Dyspnea    GERD (gastroesophageal reflux disease)    Headache(784.0)    tx. Valproic acid   Hypertension    Hypothyroidism    Pulmonary embolism [facility])     Past Surgical History:  Procedure Laterality Date   ABDOMINAL HYSTERECTOMY     BIOPSY  12/08/2019   Procedure: BIOPSY;  Surgeon: Daneil Dolin, MD;  [facility];  Service: Endoscopy;;   CATARACT EXTRACTION, BILATERAL     COLONOSCOPY N/A 12/08/2019   polyps (tubular adenoma), diverticulosis, colonic lipoma, no surveillance due to age   ESOPHAGOGASTRODUODENOSCOPY N/A 12/08/2019   normal esophagus with 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   GIVENS CAPSULE STUDY N/A 01/13/2020   Procedure: GIVENS CAPSULE STUDY;  Surgeon: Daneil Dolin, MD;  [facility];  Service: Endoscopy;  Laterality: N/A;  7:30am   MULTIPLE TOOTH EXTRACTIONS     60's   PARATHYROIDECTOMY     POLYPECTOMY  12/08/2019   Procedure: POLYPECTOMY;  Surgeon: Daneil Dolin, MD;  [facility];  Service: Endoscopy;;   TOTAL HIP ARTHROPLASTY Right 10/29/2013   Procedure: RIGHT TOTAL HIP ARTHROPLASTY ANTERIOR APPROACH;  Surgeon: Mauri Pole, MD;  [facility];  Service: Orthopedics;  Laterality: Right;    There were no vitals filed for this visit.                      [facility] Adult PT Treatment/Exercise - 03/19/20 0001      Shoulder Exercises: Seated   Other Seated Exercises UE Ranger seated x 5 minutes.      Shoulder Exercises: Pulleys   Flexion 5 minutes      Vasopneumatic   Number Minutes Vasopneumatic  15 minutes    Vasopnuematic Location  --   Right shoulder.  Pillow betwen thorax and elbow.   Vasopneumatic Pressure Low      Manual Therapy   Manual Therapy Passive ROM    Passive ROM Gentle PROM to patient's right shoulder in supine x 13 minutes.                    PT Short Term Goals - 02/24/20 1529      PT SHORT TERM GOAL #1   Title Patient will be independent with HEP    Time 4    Period Weeks    Status On-going  PT SHORT TERM GOAL #2   Title Patient will demonstrate 130+ degrees of left shoulder PROM to improve shoulder mobility    Time 4    Period Weeks    Status On-going      PT SHORT TERM GOAL #3   Title Patient will demonsrrate 45+ degrees of left shoulder PROM to improve shoulder mobility.    Time 4    Period Weeks    Status On-going             PT Long Term Goals - 02/24/20 1529      PT LONG TERM GOAL #1   Title Patient will be independent with advanced HEP    Time 8    Period Weeks    Status On-going      PT LONG TERM GOAL #2   Title Patient will demonstrate 130+ degrees of left shoulder flexion AROM to improve ability to perform overhead tasks.    Time 8    Period Weeks    Status On-going      PT LONG TERM GOAL #3   Title Patient will demonstereat 50+ degrees of left shoulder ER AROM to improve donning and doffing apparel.    Time 8    Period Weeks     Status On-going      PT LONG TERM GOAL #4   Title Patient will report ability to perform ADLs and home tasks with left shoulder pain less than or equal to 2/10.    Time 8    Period Weeks    Status On-going      PT LONG TERM GOAL #5   Title Patient will demonstrate 4/5 or greater left shoulder MMT in all planes to improve stability during functional tasks.    Time 8    Period Weeks    Status On-going                 Plan - 03/19/20 1401    Clinical Impression Statement See 'Therapy Note" section.    Personal Factors and Comorbidities Age;Comorbidity 2    Comorbidities closed reduction of left anterior shoulder dislocation 01/22/2020; HTN, DM    Examination-Activity Limitations Reach Overhead;Dressing;Lift    Stability/Clinical Decision Making Stable/Uncomplicated    Rehab Potential Good    PT Frequency 2x / week    PT Duration 8 weeks    PT Treatment/Interventions ADLs/Self Care Home Management;Cryotherapy;Electrical Stimulation;Iontophoresis 4mg /ml Dexamethasone;Ultrasound;Moist Heat;Neuromuscular re-education;Manual techniques;Passive range of motion;Therapeutic exercise;Therapeutic activities;Patient/family education;Vasopneumatic Device    PT Next Visit Plan cont with pulleys and AAROM, avoiding active flexion and abd at this time, modalities PRN    PT Home Exercise Plan see patient education section    Consulted and Agree with Plan of Care Patient           Patient will benefit from skilled therapeutic intervention in order to improve the following deficits and impairments:  Decreased activity tolerance, Decreased strength, Pain, Impaired UE functional use, Postural dysfunction  Visit Diagnosis: Stiffness of left shoulder, not elsewhere classified  Acute pain of left shoulder  Muscle weakness (generalized)  Localized edema     Problem List Patient Active Problem List   Diagnosis Date Noted   Educated about COVID-19 virus infection 02/02/2020   Chest  pain 01/03/2020   IDA (iron deficiency anemia) 01/03/2020   Acute on chronic blood loss anemia 12/09/2019   Acute on chronic GI bleed on Eliquis 12/09/2019   Iron deficiency anemia due to chronic blood loss    Occult  blood in stools    Primary osteoarthritis of both knees 05/14/2019   Unprovoked DVT and Unprovoked Pulmonary Embolism -Dxed 03/2019 04/03/2019   Sciatica of right side 10/18/2018   Seborrheic keratosis 10/17/2016   Hyperlipidemia associated with type 2 diabetes mellitus ([place]) 10/17/2016   Overactive bladder 10/17/2016   Gastroesophageal reflux disease without esophagitis 10/17/2016   Primary osteoarthritis involving multiple joints 10/17/2016   Chronic gout without tophus 10/17/2016   Type 2 diabetes mellitus without complication, without long-term current use of insulin ([place]) 05/16/2016   Essential hypertension 05/16/2016   Hypothyroidism 05/16/2016   Symptomatic anemia 10/30/2013   Morbid obesity ([place]) 10/30/2013   S/P right THA, AA 10/29/2013   Progress Note Reporting Period 02/17/20 to 03/19/20.  See note below for Objective Data and Assessment of Progress/Goals. ROM has improved significantly since starting PT and without pain increase.     Akeylah Hendel, [place] MPT 03/19/2020, 2:03 PM  [facility] [address] [place], [place], [address] Phone: [phone]   Fax:  [phone]  Name: Magdelyn Roebuck MRN: [id number] Date of Birth: 28-May-1941

## 2020-03-20 ENCOUNTER — Ambulatory Visit (HOSPITAL_COMMUNITY): Payer: Medicare Other

## 2020-03-20 DIAGNOSIS — M170 Bilateral primary osteoarthritis of knee: Secondary | ICD-10-CM | POA: Diagnosis not present

## 2020-03-20 DIAGNOSIS — M25562 Pain in left knee: Secondary | ICD-10-CM | POA: Diagnosis not present

## 2020-03-20 DIAGNOSIS — M25561 Pain in right knee: Secondary | ICD-10-CM | POA: Diagnosis not present

## 2020-03-20 DIAGNOSIS — M1712 Unilateral primary osteoarthritis, left knee: Secondary | ICD-10-CM | POA: Diagnosis not present

## 2020-03-24 ENCOUNTER — Other Ambulatory Visit: Payer: Self-pay

## 2020-03-24 ENCOUNTER — Ambulatory Visit: Payer: Medicare Other | Admitting: Physical Therapy

## 2020-03-24 ENCOUNTER — Other Ambulatory Visit: Payer: Medicare Other

## 2020-03-24 DIAGNOSIS — M6281 Muscle weakness (generalized): Secondary | ICD-10-CM

## 2020-03-24 DIAGNOSIS — D509 Iron deficiency anemia, unspecified: Secondary | ICD-10-CM | POA: Diagnosis not present

## 2020-03-24 DIAGNOSIS — R600 Localized edema: Secondary | ICD-10-CM | POA: Diagnosis not present

## 2020-03-24 DIAGNOSIS — M25612 Stiffness of left shoulder, not elsewhere classified: Secondary | ICD-10-CM | POA: Diagnosis not present

## 2020-03-24 DIAGNOSIS — M25512 Pain in left shoulder: Secondary | ICD-10-CM

## 2020-03-24 NOTE — Therapy (Signed)
[facility] [place], [place], [address] Phone: [phone]   Fax:  [phone]  Physical Therapy Treatment  Patient Details  Name: [name] [name] MRN: [id number] Date of Birth: [birth year] Referring Provider (PT): Sanjuana Kava, MD   Encounter Date: 03/24/2020   PT End of Session - 03/24/20 1147    Visit Number 11    Number of Visits 16    Date for PT Re-Evaluation 04/20/20    Authorization Type FOTO; Progress note every 10th visit; KX modifier at 15th visit    PT Start Time 1116    PT Stop Time 1159    PT Time Calculation (min) 43 min    Activity Tolerance Patient tolerated treatment well    Behavior During Therapy [facility] for tasks assessed/performed           Past Medical History:  Diagnosis Date  . Anemia   . Arthritis    Ostearthritis- hips, knees, fingers  . Diabetes mellitus without complication ([place])   . DVT (deep venous thrombosis) ([place])   . Dyspnea   . GERD (gastroesophageal reflux disease)   . Headache(784.0)    tx. Valproic acid  . Hypertension   . Hypothyroidism   . Pulmonary embolism [facility])     Past Surgical History:  Procedure Laterality Date  . ABDOMINAL HYSTERECTOMY    . BIOPSY  12/08/2019   Procedure: BIOPSY;  Surgeon: Daneil Dolin, MD;  [facility];  Service: Endoscopy;;  . CATARACT EXTRACTION, BILATERAL    . COLONOSCOPY N/A 12/08/2019   polyps (tubular adenoma), diverticulosis, colonic lipoma, no surveillance due to age  . 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  [name] N/A 01/13/2020   Procedure: GIVENS CAPSULE STUDY;  Surgeon: Daneil Dolin, MD;  [facility];  Service: Endoscopy;  Laterality: N/A;  7:30am  . MULTIPLE TOOTH EXTRACTIONS     60's  . PARATHYROIDECTOMY    . POLYPECTOMY  12/08/2019   Procedure: POLYPECTOMY;  Surgeon: Daneil Dolin, MD;  [facility];  Service: Endoscopy;;  . TOTAL HIP ARTHROPLASTY Right 10/29/2013   Procedure: RIGHT TOTAL HIP ARTHROPLASTY ANTERIOR APPROACH;  Surgeon: Mauri Pole, MD;  [facility];  Service: Orthopedics;  Laterality: Right;    There were no vitals filed for this visit.   Subjective Assessment - 03/24/20 1126    Subjective COVID-19 screen performed prior to patient entering clinic.  Pain in shoulder not too bad.    Pertinent History closed reduction of anterior shoulder dislocation 01/22/2020, HTN, osteopenia, DM 2,    Limitations House hold activities;Lifting    Diagnostic tests x-ray: see imaging    Patient Stated Goals improve movement    Currently in Pain? Yes    Pain Score 2     Pain Location Shoulder    Pain Orientation Left    Pain Descriptors / Indicators Discomfort    Pain Type Acute pain    Pain Onset More than a month ago                             [facility] Adult PT Treatment/Exercise - 03/24/20 0001      Exercises   Exercises Shoulder      Shoulder Exercises: Pulleys   Flexion 5 minutes    Other Pulley Exercises Seated UE Ranger x 5 minutes.  Vasopneumatic   Number Minutes Vasopneumatic  15 minutes    Vasopnuematic Location  --   Left shoulder.   Vasopneumatic Pressure Low      Manual Therapy   Manual Therapy Passive ROM    Passive ROM Gentle PROM x 14 minutes to patient's left shoulder in supine.                    PT Short Term Goals - 02/24/20 1529      PT SHORT TERM GOAL #1   Title Patient will be independent with HEP    Time 4    Period Weeks    Status On-going      PT SHORT TERM GOAL #2   Title Patient will demonstrate 130+ degrees of left shoulder PROM to improve shoulder mobility    Time 4    Period Weeks    Status On-going      PT SHORT TERM GOAL #3   Title Patient will demonsrrate 45+ degrees of left shoulder PROM to improve shoulder mobility.    Time 4    Period Weeks    Status  On-going             PT Long Term Goals - 02/24/20 1529      PT LONG TERM GOAL #1   Title Patient will be independent with advanced HEP    Time 8    Period Weeks    Status On-going      PT LONG TERM GOAL #2   Title Patient will demonstrate 130+ degrees of left shoulder flexion AROM to improve ability to perform overhead tasks.    Time 8    Period Weeks    Status On-going      PT LONG TERM GOAL #3   Title Patient will demonstereat 50+ degrees of left shoulder ER AROM to improve donning and doffing apparel.    Time 8    Period Weeks    Status On-going      PT LONG TERM GOAL #4   Title Patient will report ability to perform ADLs and home tasks with left shoulder pain less than or equal to 2/10.    Time 8    Period Weeks    Status On-going      PT LONG TERM GOAL #5   Title Patient will demonstrate 4/5 or greater left shoulder MMT in all planes to improve stability during functional tasks.    Time 8    Period Weeks    Status On-going                 Plan - 03/24/20 1147    Clinical Impression Statement patient's left shoulder range of motion is improving nicely.  She states she is able to do more at home now using her left UE.    Personal Factors and Comorbidities Age;Comorbidity 2    Comorbidities closed reduction of left anterior shoulder dislocation 01/22/2020; HTN, DM    Examination-Activity Limitations Reach Overhead;Dressing;Lift    Examination-Participation Restrictions Cleaning;Driving    Stability/Clinical Decision Making Stable/Uncomplicated    Rehab Potential Good    PT Frequency 2x / week    PT Duration 8 weeks    PT Treatment/Interventions ADLs/Self Care Home Management;Cryotherapy;Electrical Stimulation;Iontophoresis 4mg /ml Dexamethasone;Ultrasound;Moist Heat;Neuromuscular re-education;Manual techniques;Passive range of motion;Therapeutic exercise;Therapeutic activities;Patient/family education;Vasopneumatic Device    PT Next Visit Plan cont with  pulleys and AAROM, avoiding active flexion and abd at this time, modalities PRN    PT Home Exercise  Plan see patient education section    Consulted and Agree with Plan of Care Patient           Patient will benefit from skilled therapeutic intervention in order to improve the following deficits and impairments:  Decreased activity tolerance, Decreased strength, Pain, Impaired UE functional use, Postural dysfunction  Visit Diagnosis: Stiffness of left shoulder, not elsewhere classified  Acute pain of left shoulder  Muscle weakness (generalized)  Localized edema     Problem List Patient Active Problem List   Diagnosis Date Noted  . Educated about COVID-19 virus infection 02/02/2020  . Chest pain 01/03/2020  . IDA (iron deficiency anemia) 01/03/2020  . Acute on chronic blood loss anemia 12/09/2019  . Acute on chronic GI bleed on Eliquis 12/09/2019  . Iron deficiency anemia due to chronic blood loss   . Occult blood in stools   . Primary osteoarthritis of both knees 05/14/2019  . Unprovoked DVT and Unprovoked Pulmonary Embolism -Dxed 03/2019 04/03/2019  . Sciatica of right side 10/18/2018  . Seborrheic keratosis 10/17/2016  . Hyperlipidemia associated with type 2 diabetes mellitus ([place]) 10/17/2016  . Overactive bladder 10/17/2016  . Gastroesophageal reflux disease without esophagitis 10/17/2016  . Primary osteoarthritis involving multiple joints 10/17/2016  . Chronic gout without tophus 10/17/2016  . Type 2 diabetes mellitus without complication, without long-term current use of insulin ([place]) 05/16/2016  . Essential hypertension 05/16/2016  . Hypothyroidism 05/16/2016  . Symptomatic anemia 10/30/2013  . Morbid obesity ([place]) 10/30/2013  . S/P right THA, AA 10/29/2013    Kamalei Roeder, [place] MPT 03/24/2020, 11:59 AM  [facility] [place], [place], [address] Phone: [phone]   Fax:  [phone]  Name: Deryn Massengale MRN: [id number] Date of Birth: 1941/06/19

## 2020-03-25 ENCOUNTER — Telehealth: Payer: Medicare Other | Admitting: *Deleted

## 2020-03-25 LAB — SPECIMEN STATUS

## 2020-03-25 LAB — IRON,TIBC AND FERRITIN PANEL
Ferritin: 7 ng/mL — ABNORMAL LOW (ref 15–150)
Iron Saturation: 4 % — CL (ref 15–55)
Iron: 17 ug/dL — ABNORMAL LOW (ref 27–139)
Total Iron Binding Capacity: 432 ug/dL (ref 250–450)
UIBC: 415 ug/dL — ABNORMAL HIGH (ref 118–369)

## 2020-03-26 ENCOUNTER — Ambulatory Visit (INDEPENDENT_AMBULATORY_CARE_PROVIDER_SITE_OTHER): Payer: Medicare Other | Admitting: Orthopaedic Surgery

## 2020-03-26 ENCOUNTER — Other Ambulatory Visit: Payer: Self-pay

## 2020-03-26 ENCOUNTER — Encounter: Payer: Self-pay | Admitting: Orthopaedic Surgery

## 2020-03-26 VITALS — BP 177/102 | HR 80 | Ht 63.0 in | Wt 207.0 lb

## 2020-03-26 DIAGNOSIS — S43015D Anterior dislocation of left humerus, subsequent encounter: Secondary | ICD-10-CM | POA: Diagnosis not present

## 2020-03-26 NOTE — Progress Notes (Signed)
Patient FI:EPPIR [name] [name], female DOB:[birth year], 79 y.o. JJO:[id number]  Chief Complaint  Patient presents with  . Shoulder Injury    left/ improving with therapy     HPI  [name] [name] is a 79 y.o. female who had anterior dislocation of the left shoulder.  She has been going to PT.  I have reviewed their notes. She is improving with less pain and more motion.  She needs to continue the PT.  She has no paresthesias.     Body mass index is 36.67 kg/m.  ROS  Review of Systems  Constitutional: Positive for activity change.  Musculoskeletal: Positive for arthralgias and joint swelling.  All other systems reviewed and are negative.   All other systems reviewed and are negative.  The following is a summary of the past history medically, past history surgically, known current medicines, social history and family history.  This information is gathered electronically by the computer from prior information and documentation.  I review this each visit and have found including this information at this point in the chart is beneficial and informative.    Past Medical History:  Diagnosis Date  . Anemia   . Arthritis    Ostearthritis- hips, knees, fingers  . Diabetes mellitus without complication ([place])   . DVT (deep venous thrombosis) ([place])   . Dyspnea   . GERD (gastroesophageal reflux disease)   . Headache(784.0)    tx. Valproic acid  . Hypertension   . Hypothyroidism   . Pulmonary embolism [facility])     Past Surgical History:  Procedure Laterality Date  . ABDOMINAL HYSTERECTOMY    . BIOPSY  12/08/2019   Procedure: BIOPSY;  Surgeon: Daneil Dolin, MD;  [facility];  Service: Endoscopy;;  . CATARACT EXTRACTION, BILATERAL    . COLONOSCOPY N/A 12/08/2019   polyps (tubular adenoma), diverticulosis, colonic lipoma, no surveillance due to age  . ESOPHAGOGASTRODUODENOSCOPY N/A 12/08/2019   normal esophagus with possibly early GAVE, normal duodenum, gastric biopsy:  negative H.pylori.  [name] N/A 01/13/2020   Procedure: GIVENS CAPSULE STUDY;  Surgeon: Daneil Dolin, MD;  [facility];  Service: Endoscopy;  Laterality: N/A;  7:30am  . MULTIPLE TOOTH EXTRACTIONS     60's  . PARATHYROIDECTOMY    . POLYPECTOMY  12/08/2019   Procedure: POLYPECTOMY;  Surgeon: Daneil Dolin, MD;  [facility];  Service: Endoscopy;;  . TOTAL HIP ARTHROPLASTY Right 10/29/2013   Procedure: RIGHT TOTAL HIP ARTHROPLASTY ANTERIOR APPROACH;  Surgeon: Mauri Pole, MD;  [facility];  Service: Orthopedics;  Laterality: Right;    Family History  Problem Relation Age of Onset  . Colitis Sister 57       alive  . Cancer Sister 38       colon  . Cancer Mother 26       uterine  . Heart disease Father 41       heart failure  . Heart attack Brother 56  . Healthy Daughter   . Healthy Son   . Pulmonary embolism Brother 78  . Heart disease Brother   . Healthy Son   . Healthy Son     Social History Social History   Tobacco Use  . Smoking status: Never Smoker  . Smokeless tobacco: Never Used  Vaping Use  . Vaping Use: Never used  Substance Use Topics  . Alcohol use: No    Alcohol/week: 0.0 standard drinks  . Drug use: No  No Known Allergies  Current Outpatient Medications  Medication Sig Dispense Refill  . acetaminophen (TYLENOL) 325 MG tablet Take 2 tablets (650 mg total) by mouth every 6 (six) hours as needed for mild pain or headache (or Fever >/= 101). 12 tablet 0  . allopurinol (ZYLOPRIM) 100 MG tablet Take 1 tablet (100 mg total) by mouth daily. 90 tablet 1  . amLODipine (NORVASC) 5 MG tablet Take 1 tablet (5 mg total) by mouth daily. 90 tablet 1  . apixaban (ELIQUIS) 2.5 MG TABS tablet Take 1 tablet (2.5 mg total) by mouth 2 (two) times daily. 180 tablet 3  . Cholecalciferol (VITAMIN D-3) 5000 UNITS TABS Take 5,000 tablets by mouth daily.    . Colchicine 0.6 MG CAPS Take 1.2 mg (2 capsules) by mouth x1, then 0.6 mg  (1 capsule) 1 hour later x1 as needed for gout flare. 9 capsule 2  . diclofenac sodium (VOLTAREN) 1 % GEL Apply 4 g topically 4 (four) times daily. 200 g 11  . furosemide (LASIX) 20 MG tablet Take 1 tablet (20 mg total) by mouth 2 (two) times daily. 180 tablet 1  . levothyroxine (SYNTHROID) 25 MCG tablet Take 1 tablet (25 mcg total) by mouth daily before breakfast. 90 tablet 1  . loratadine (CLARITIN) 10 MG tablet Take 10 mg by mouth daily.    [name] 100 MG tablet Take 1 tablet (100 mg total) by mouth every morning. 90 tablet 1  . metFORMIN (GLUCOPHAGE) 500 MG tablet Take 1 tablet (500 mg total) by mouth 2 (two) times daily with a meal. Give w/food. 180 tablet 1  . ONE TOUCH ULTRA TEST test strip USE UP TO FOUR TIMES DAILY AS DIRECTED 400 each 3  . oxybutynin (DITROPAN) 5 MG tablet Take 1 tablet (5 mg total) by mouth 3 (three) times daily. Needs to be seen for further refills 270 tablet 0  . pantoprazole (PROTONIX) 40 MG tablet Take 1 tablet (40 mg total) by mouth 2 (two) times daily before a meal. 14 tablet 0  . simvastatin (ZOCOR) 40 MG tablet Take 1 tablet (40 mg total) by mouth every evening. Needs to be seen for further refills. 90 tablet 1   No current facility-administered medications for this visit.     Physical Exam  Blood pressure (!) 177/102, pulse 80, height 5\' 3"  (1.6 m), weight 207 lb (93.9 kg).  Constitutional: overall normal hygiene, normal nutrition, well developed, normal grooming, normal body habitus. Assistive device:none  Musculoskeletal: gait and station Limp none, muscle tone and strength are normal, no tremors or atrophy is present.  .  Neurological: coordination overall normal.  Deep tendon reflex/nerve stretch intact.  Sensation normal.  Cranial nerves II-XII intact.   Skin:   Normal overall no scars, lesions, ulcers or rashes. No psoriasis.  Psychiatric: Alert and oriented x 3.  Recent memory intact, remote memory unclear.  Normal mood and affect.  Well groomed.  Good eye contact.  Cardiovascular: overall no swelling, no varicosities, no edema bilaterally, normal temperatures of the legs and arms, no clubbing, cyanosis and good capillary refill.  Examination of left Upper Extremity is done.  Inspection:   Overall:  Elbow non-tender without crepitus or defects, forearm non-tender without crepitus or defects, wrist non-tender without crepitus or defects, hand non-tender.    Shoulder: with glenohumeral joint tenderness, without effusion.   Upper arm:  without swelling and tenderness   Range of motion:   Overall:  Full range of motion of the elbow,  full range of motion of wrist and full range of motion in fingers.   Shoulder:  left  120 degrees forward flexion; 100 degrees abduction; 25 degrees internal rotation, 25 degrees external rotation, 15 degrees extension, 40 degrees adduction.   Stability:   Overall:  Shoulder, elbow and wrist stable   Strength and Tone:   Overall full shoulder muscles strength, full upper arm strength and normal upper arm bulk and tone.  Lymphatic: palpation is normal.  All other systems reviewed and are negative   The patient has been educated about the nature of the problem(s) and counseled on treatment options.  The patient appeared to understand what I have discussed and is in agreement with it.  Encounter Diagnosis  Name Primary?  Marland Kitchen Anterior dislocation of left shoulder, subsequent encounter Yes    PLAN Call if any problems.  Precautions discussed.  Continue current medications.   Return to clinic 3 weeks   Continue PT.  Electronically Signed Sanjuana Kava, MD 9/2/202110:46 AM

## 2020-03-27 ENCOUNTER — Ambulatory Visit: Payer: Medicare Other | Attending: Orthopaedic Surgery | Admitting: Physical Therapy

## 2020-03-27 ENCOUNTER — Encounter: Payer: Self-pay | Admitting: Physical Therapy

## 2020-03-27 ENCOUNTER — Other Ambulatory Visit: Payer: Self-pay

## 2020-03-27 DIAGNOSIS — M25512 Pain in left shoulder: Secondary | ICD-10-CM | POA: Diagnosis not present

## 2020-03-27 DIAGNOSIS — R600 Localized edema: Secondary | ICD-10-CM

## 2020-03-27 DIAGNOSIS — M25612 Stiffness of left shoulder, not elsewhere classified: Secondary | ICD-10-CM | POA: Insufficient documentation

## 2020-03-27 DIAGNOSIS — M6281 Muscle weakness (generalized): Secondary | ICD-10-CM | POA: Insufficient documentation

## 2020-03-27 NOTE — Therapy (Signed)
[facility] [place], [place], [address] Phone: [phone]   Fax:  [phone]  Physical Therapy Treatment  Patient Details  Name: [name] [name] MRN: [id number] Date of Birth: Apr 10, 1941 Referring Provider (PT): Sanjuana Kava, MD   Encounter Date: 03/27/2020   PT End of Session - 03/27/20 1113    Visit Number 12    Number of Visits 16    Date for PT Re-Evaluation 04/20/20    Authorization Type FOTO; Progress note every 10th visit; KX modifier at 15th visit    PT Start Time 1030    PT Stop Time 1109    PT Time Calculation (min) 39 min    Equipment Utilized During Treatment Other (comment)    Activity Tolerance Patient tolerated treatment well    Behavior During Therapy [facility] for tasks assessed/performed           Past Medical History:  Diagnosis Date  . Anemia   . Arthritis    Ostearthritis- hips, knees, fingers  . Diabetes mellitus without complication ([place])   . DVT (deep venous thrombosis) ([place])   . Dyspnea   . GERD (gastroesophageal reflux disease)   . Headache(784.0)    tx. Valproic acid  . Hypertension   . Hypothyroidism   . Pulmonary embolism [facility])     Past Surgical History:  Procedure Laterality Date  . ABDOMINAL HYSTERECTOMY    . BIOPSY  12/08/2019   Procedure: BIOPSY;  Surgeon: Daneil Dolin, MD;  [facility];  Service: Endoscopy;;  . CATARACT EXTRACTION, BILATERAL    . COLONOSCOPY N/A 12/08/2019   polyps (tubular adenoma), diverticulosis, colonic lipoma, no surveillance due to age  . 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  [name] N/A 01/13/2020   Procedure: GIVENS CAPSULE STUDY;  Surgeon: Daneil Dolin, MD;  [facility];  Service: Endoscopy;  Laterality: N/A;  7:30am  . MULTIPLE TOOTH EXTRACTIONS     60's  . PARATHYROIDECTOMY    . POLYPECTOMY   12/08/2019   Procedure: POLYPECTOMY;  Surgeon: Daneil Dolin, MD;  [facility];  Service: Endoscopy;;  . TOTAL HIP ARTHROPLASTY Right 10/29/2013   Procedure: RIGHT TOTAL HIP ARTHROPLASTY ANTERIOR APPROACH;  Surgeon: Mauri Pole, MD;  [facility];  Service: Orthopedics;  Laterality: Right;    There were no vitals filed for this visit.   Subjective Assessment - 03/27/20 1112    Subjective COVID-19 screen performed prior to patient entering clinic.  Pt reporting good report from MD.    Pertinent History closed reduction of anterior shoulder dislocation 01/22/2020, HTN, osteopenia, DM 2,    Limitations House hold activities;Lifting    Diagnostic tests x-ray: see imaging    Patient Stated Goals improve movement    Currently in Pain? Yes    Pain Score 2     Pain Location Shoulder    Pain Orientation Left    Pain Descriptors / Indicators Sore    Pain Type Acute pain    Pain Onset More than a month ago                             [facility] Adult PT Treatment/Exercise - 03/27/20 0001      Exercises   Exercises Shoulder      Shoulder Exercises: Supine   External Rotation AAROM;12 reps  Flexion AAROM;12 reps      Shoulder Exercises: Pulleys   Flexion 5 minutes    Other Pulley Exercises Seated UE Ranger x 5 minutes.      Vasopneumatic   Number Minutes Vasopneumatic  10 minutes    Vasopnuematic Location  Shoulder    Vasopneumatic Pressure Low    Vasopneumatic Temperature  34 for edema      Manual Therapy   Manual Therapy Passive ROM    Manual therapy comments 10    Passive ROM Gentle PROM to patient's left shoulder in supine.                    PT Short Term Goals - 02/24/20 1529      PT SHORT TERM GOAL #1   Title Patient will be independent with HEP    Time 4    Period Weeks    Status On-going      PT SHORT TERM GOAL #2   Title Patient will demonstrate 130+ degrees of left shoulder PROM to improve shoulder mobility    Time 4     Period Weeks    Status On-going      PT SHORT TERM GOAL #3   Title Patient will demonsrrate 45+ degrees of left shoulder PROM to improve shoulder mobility.    Time 4    Period Weeks    Status On-going             PT Long Term Goals - 02/24/20 1529      PT LONG TERM GOAL #1   Title Patient will be independent with advanced HEP    Time 8    Period Weeks    Status On-going      PT LONG TERM GOAL #2   Title Patient will demonstrate 130+ degrees of left shoulder flexion AROM to improve ability to perform overhead tasks.    Time 8    Period Weeks    Status On-going      PT LONG TERM GOAL #3   Title Patient will demonstereat 50+ degrees of left shoulder ER AROM to improve donning and doffing apparel.    Time 8    Period Weeks    Status On-going      PT LONG TERM GOAL #4   Title Patient will report ability to perform ADLs and home tasks with left shoulder pain less than or equal to 2/10.    Time 8    Period Weeks    Status On-going      PT LONG TERM GOAL #5   Title Patient will demonstrate 4/5 or greater left shoulder MMT in all planes to improve stability during functional tasks.    Time 8    Period Weeks    Status On-going                 Plan - 03/27/20 1114    Clinical Impression Statement Pt arriving to therapy 2/10 pain upon arrival. Pt reporting compliance with stretching. Pt also reporting she needed to leave a little early due to previous engagement. Pt toleraing exercises well, no reports of increaesd pain during session.    Personal Factors and Comorbidities Age;Comorbidity 2    Comorbidities closed reduction of left anterior shoulder dislocation 01/22/2020; HTN, DM    Examination-Activity Limitations Reach Overhead;Dressing;Lift    Stability/Clinical Decision Making Stable/Uncomplicated    Rehab Potential Good    PT Frequency 2x / week    PT Duration 8 weeks  PT Treatment/Interventions ADLs/Self Care Home Management;Cryotherapy;Electrical  Stimulation;Iontophoresis 4mg /ml Dexamethasone;Ultrasound;Moist Heat;Neuromuscular re-education;Manual techniques;Passive range of motion;Therapeutic exercise;Therapeutic activities;Patient/family education;Vasopneumatic Device    PT Next Visit Plan cont with pulleys and AAROM, avoiding active flexion and abd at this time, modalities PRN    PT Home Exercise Plan see patient education section    Consulted and Agree with Plan of Care Patient           Patient will benefit from skilled therapeutic intervention in order to improve the following deficits and impairments:  Decreased activity tolerance, Decreased strength, Pain, Impaired UE functional use, Postural dysfunction  Visit Diagnosis: Stiffness of left shoulder, not elsewhere classified  Acute pain of left shoulder  Muscle weakness (generalized)  Localized edema     Problem List Patient Active Problem List   Diagnosis Date Noted  . Educated about COVID-19 virus infection 02/02/2020  . Chest pain 01/03/2020  . IDA (iron deficiency anemia) 01/03/2020  . Acute on chronic blood loss anemia 12/09/2019  . Acute on chronic GI bleed on Eliquis 12/09/2019  . Iron deficiency anemia due to chronic blood loss   . Occult blood in stools   . Primary osteoarthritis of both knees 05/14/2019  . Unprovoked DVT and Unprovoked Pulmonary Embolism -Dxed 03/2019 04/03/2019  . Sciatica of right side 10/18/2018  . Seborrheic keratosis 10/17/2016  . Hyperlipidemia associated with type 2 diabetes mellitus ([place]) 10/17/2016  . Overactive bladder 10/17/2016  . Gastroesophageal reflux disease without esophagitis 10/17/2016  . Primary osteoarthritis involving multiple joints 10/17/2016  . Chronic gout without tophus 10/17/2016  . Type 2 diabetes mellitus without complication, without long-term current use of insulin ([place]) 05/16/2016  . Essential hypertension 05/16/2016  . Hypothyroidism 05/16/2016  . Symptomatic anemia 10/30/2013  . Morbid obesity  ([place]) 10/30/2013  . S/P right THA, AA 10/29/2013    Oretha Caprice , PT, MPT 03/27/2020, 12:30 PM  [facility] [address] [place], [place], [address] Phone: [phone]   Fax:  [phone]  Name: Aavya Shafer MRN: [id number] Date of Birth: June 10, 1941

## 2020-04-01 ENCOUNTER — Ambulatory Visit: Payer: Medicare Other | Admitting: Physical Therapy

## 2020-04-01 ENCOUNTER — Other Ambulatory Visit: Payer: Self-pay

## 2020-04-01 DIAGNOSIS — M6281 Muscle weakness (generalized): Secondary | ICD-10-CM | POA: Diagnosis not present

## 2020-04-01 DIAGNOSIS — M25512 Pain in left shoulder: Secondary | ICD-10-CM | POA: Diagnosis not present

## 2020-04-01 DIAGNOSIS — R600 Localized edema: Secondary | ICD-10-CM

## 2020-04-01 DIAGNOSIS — M25612 Stiffness of left shoulder, not elsewhere classified: Secondary | ICD-10-CM

## 2020-04-01 NOTE — Therapy (Signed)
[facility] [place], [place], [address] Phone: [phone]   Fax:  [phone]  Physical Therapy Treatment  Patient Details  Name: [name] [name] MRN: [id number] Date of Birth: Mar 16, 1941 Referring Provider (PT): Sanjuana Kava, MD   Encounter Date: 04/01/2020   PT End of Session - 04/01/20 1350    Visit Number 13    Number of Visits 16    Date for PT Re-Evaluation 04/20/20    Authorization Type FOTO 13th visit 41%; Progress note every 10th visit; KX modifier at 15th visit    PT Start Time 0101    PT Stop Time 0157    PT Time Calculation (min) 56 min    Activity Tolerance Patient tolerated treatment well    Behavior During Therapy [facility] for tasks assessed/performed           Past Medical History:  Diagnosis Date  . Anemia   . Arthritis    Ostearthritis- hips, knees, fingers  . Diabetes mellitus without complication ([place])   . DVT (deep venous thrombosis) ([place])   . Dyspnea   . GERD (gastroesophageal reflux disease)   . Headache(784.0)    tx. Valproic acid  . Hypertension   . Hypothyroidism   . Pulmonary embolism [facility])     Past Surgical History:  Procedure Laterality Date  . ABDOMINAL HYSTERECTOMY    . BIOPSY  12/08/2019   Procedure: BIOPSY;  Surgeon: Daneil Dolin, MD;  [facility];  Service: Endoscopy;;  . CATARACT EXTRACTION, BILATERAL    . COLONOSCOPY N/A 12/08/2019   polyps (tubular adenoma), diverticulosis, colonic lipoma, no surveillance due to age  . 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  [name] N/A 01/13/2020   Procedure: GIVENS CAPSULE STUDY;  Surgeon: Daneil Dolin, MD;  [facility];  Service: Endoscopy;  Laterality: N/A;  7:30am  . MULTIPLE TOOTH EXTRACTIONS     60's  . PARATHYROIDECTOMY    . POLYPECTOMY  12/08/2019   Procedure: POLYPECTOMY;  Surgeon:  Daneil Dolin, MD;  [facility];  Service: Endoscopy;;  . TOTAL HIP ARTHROPLASTY Right 10/29/2013   Procedure: RIGHT TOTAL HIP ARTHROPLASTY ANTERIOR APPROACH;  Surgeon: Mauri Pole, MD;  [facility];  Service: Orthopedics;  Laterality: Right;    There were no vitals filed for this visit.   Subjective Assessment - 04/01/20 1302    Subjective COVID-19 screen performed prior to patient entering clinic.  Patient arrived with no pain and doing well today.    Pertinent History closed reduction of anterior shoulder dislocation 01/22/2020, HTN, osteopenia, DM 2,    Limitations House hold activities;Lifting    Diagnostic tests x-ray: see imaging    Patient Stated Goals improve movement    Currently in Pain? No/denies              [facility] PT Assessment - 04/01/20 0001      ROM / Strength   AROM / PROM / Strength AROM;PROM      AROM   AROM Assessment Site Shoulder    Right/Left Shoulder Left    Left Shoulder Flexion 90 Degrees    Left Shoulder External Rotation 55 Degrees      PROM   PROM Assessment Site Shoulder    Right/Left Shoulder Left    Left Shoulder Flexion 130 Degrees    Left Shoulder External Rotation 65 Degrees  [facility] Adult PT Treatment/Exercise - 04/01/20 0001      Shoulder Exercises: Supine   External Rotation AAROM;Both;20 reps    Flexion AAROM;Both;20 reps      Shoulder Exercises: Pulleys   Flexion 5 minutes    Other Pulley Exercises Seated UE Ranger x 5 minutes.      Shoulder Exercises: Isometric Strengthening   Flexion Other (comment)   10reps   Extension Other (comment)   10reps   External Rotation Other (comment)   10 reps   Internal Rotation Other (comment)   10 reps   ABduction Other (comment)   10 reps     Vasopneumatic   Number Minutes Vasopneumatic  10 minutes    Vasopnuematic Location  Shoulder    Vasopneumatic Pressure Low    Vasopneumatic Temperature  34 for edema      Manual Therapy    Manual Therapy Passive ROM;Soft tissue mobilization    Soft tissue mobilization STW to left deltoid area of soreness    Passive ROM gentle PROM for left shoudler and  rhythmic stabs for IR/ER                    PT Short Term Goals - 04/01/20 1304      PT SHORT TERM GOAL #1   Title Patient will be independent with HEP    Baseline Doing daily 04/01/20    Time 4    Period Weeks    Status Achieved      PT SHORT TERM GOAL #2   Title Patient will demonstrate 130+ degrees of left shoulder PROM to improve shoulder mobility    Baseline PROM 130 degrees 04/01/20    Time 4    Period Weeks    Status Achieved      PT SHORT TERM GOAL #3   Title Patient will demonsrrate 45+ degrees of left shoulder PROM to improve shoulder mobility.    Baseline AROM 55 degrees 04/01/20    Time 4    Period Weeks    Status Achieved             PT Long Term Goals - 04/01/20 1305      PT LONG TERM GOAL #1   Title Patient will be independent with advanced HEP    Time 8    Period Weeks    Status On-going      PT LONG TERM GOAL #2   Title Patient will demonstrate 130+ degrees of left shoulder flexion AROM to improve ability to perform overhead tasks.    Baseline AROM 90 degrees 04/01/20    Time 8    Period Weeks    Status On-going      PT LONG TERM GOAL #3   Title Patient will demonstereat 50+ degrees of left shoulder ER AROM to improve donning and doffing apparel.    Baseline AROM 55 degrees 04/01/20    Time 8    Period Weeks    Status Achieved      PT LONG TERM GOAL #4   Title Patient will report ability to perform ADLs and home tasks with left shoulder pain less than or equal to 2/10.    Time 8    Period Weeks    Status On-going      PT LONG TERM GOAL #5   Title Patient will demonstrate 4/5 or greater left shoulder MMT in all planes to improve stability during functional tasks.    Time 8    Period Weeks  Status On-going                 Plan - 04/01/20 1358    Clinical  Impression Statement Patient tolerated treatment well today. Patient has improved with overall ROM and little soreness in left shoulder. Patient is not using her shoulder for any active movements overhead yet doing light ADL's under 90 degrees per MD. Patient has met all STG's and LTG #3 with others progressing.    Personal Factors and Comorbidities Age;Comorbidity 2    Comorbidities closed reduction of left anterior shoulder dislocation 01/22/2020; HTN, DM    Examination-Activity Limitations Reach Overhead;Dressing;Lift    Examination-Participation Restrictions Cleaning;Driving    Stability/Clinical Decision Making Stable/Uncomplicated    Rehab Potential Good    PT Frequency 2x / week    PT Duration 8 weeks    PT Treatment/Interventions ADLs/Self Care Home Management;Cryotherapy;Electrical Stimulation;Iontophoresis 44m/ml Dexamethasone;Ultrasound;Moist Heat;Neuromuscular re-education;Manual techniques;Passive range of motion;Therapeutic exercise;Therapeutic activities;Patient/family education;Vasopneumatic Device    PT Next Visit Plan cont with pulleys and AAROM, avoiding active flexion and abd at this time, modalities PRN 10 weeks post incendent    Consulted and Agree with Plan of Care Patient           Patient will benefit from skilled therapeutic intervention in order to improve the following deficits and impairments:  Decreased activity tolerance, Decreased strength, Pain, Impaired UE functional use, Postural dysfunction  Visit Diagnosis: Stiffness of left shoulder, not elsewhere classified  Acute pain of left shoulder  Muscle weakness (generalized)  Localized edema     Problem List Patient Active Problem List   Diagnosis Date Noted  . Educated about COVID-19 virus infection 02/02/2020  . Chest pain 01/03/2020  . IDA (iron deficiency anemia) 01/03/2020  . Acute on chronic blood loss anemia 12/09/2019  . Acute on chronic GI bleed on Eliquis 12/09/2019  . Iron deficiency  anemia due to chronic blood loss   . Occult blood in stools   . Primary osteoarthritis of both knees 05/14/2019  . Unprovoked DVT and Unprovoked Pulmonary Embolism -Dxed 03/2019 04/03/2019  . Sciatica of right side 10/18/2018  . Seborrheic keratosis 10/17/2016  . Hyperlipidemia associated with type 2 diabetes mellitus ([place] 10/17/2016  . Overactive bladder 10/17/2016  . Gastroesophageal reflux disease without esophagitis 10/17/2016  . Primary osteoarthritis involving multiple joints 10/17/2016  . Chronic gout without tophus 10/17/2016  . Type 2 diabetes mellitus without complication, without long-term current use of insulin ([place] 05/16/2016  . Essential hypertension 05/16/2016  . Hypothyroidism 05/16/2016  . Symptomatic anemia 10/30/2013  . Morbid obesity ([place] 10/30/2013  . S/[name] right THA, AA 10/29/2013    [name] [name], [name] [name] 04/01/2020, 2:00 PM  [facility] [address]Phone: [phone]  Fax:  [phone] Name: GAriana JuulMRN: [id number] of Birth: 104-09-1940

## 2020-04-02 ENCOUNTER — Other Ambulatory Visit: Payer: Self-pay

## 2020-04-02 DIAGNOSIS — D509 Iron deficiency anemia, unspecified: Secondary | ICD-10-CM

## 2020-04-03 ENCOUNTER — Ambulatory Visit: Payer: Medicare Other | Admitting: Physical Therapy

## 2020-04-03 ENCOUNTER — Encounter: Payer: Self-pay | Admitting: Physical Therapy

## 2020-04-03 ENCOUNTER — Other Ambulatory Visit: Payer: Self-pay

## 2020-04-03 DIAGNOSIS — M6281 Muscle weakness (generalized): Secondary | ICD-10-CM

## 2020-04-03 DIAGNOSIS — M25612 Stiffness of left shoulder, not elsewhere classified: Secondary | ICD-10-CM

## 2020-04-03 DIAGNOSIS — M25512 Pain in left shoulder: Secondary | ICD-10-CM

## 2020-04-03 DIAGNOSIS — R600 Localized edema: Secondary | ICD-10-CM

## 2020-04-03 NOTE — Therapy (Signed)
[facility] [place], [place], [address] Phone: [phone]   Fax:  [phone]  Physical Therapy Treatment  Patient Details  Name: Mersades Barbaro MRN: [id number] Date of Birth: 05/17/1941 Referring Provider (PT): Sanjuana Kava, MD   Encounter Date: 04/03/2020   PT End of Session - 04/03/20 0944    Visit Number 14    Number of Visits 16    Date for PT Re-Evaluation 04/20/20    Authorization Type FOTO 13th visit 41%; Progress note every 10th visit; KX modifier at 15th visit    PT Start Time 0900    PT Stop Time 0950    PT Time Calculation (min) 50 min    Activity Tolerance Patient tolerated treatment well    Behavior During Therapy [facility] for tasks assessed/performed           Past Medical History:  Diagnosis Date  . Anemia   . Arthritis    Ostearthritis- hips, knees, fingers  . Diabetes mellitus without complication ([place])   . DVT (deep venous thrombosis) ([place])   . Dyspnea   . GERD (gastroesophageal reflux disease)   . Headache(784.0)    tx. Valproic acid  . Hypertension   . Hypothyroidism   . Pulmonary embolism [facility])     Past Surgical History:  Procedure Laterality Date  . ABDOMINAL HYSTERECTOMY    . BIOPSY  12/08/2019   Procedure: BIOPSY;  Surgeon: Daneil Dolin, MD;  [facility];  Service: Endoscopy;;  . CATARACT EXTRACTION, BILATERAL    . COLONOSCOPY N/A 12/08/2019   polyps (tubular adenoma), diverticulosis, colonic lipoma, no surveillance due to age  . 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  [name] N/A 01/13/2020   Procedure: GIVENS CAPSULE STUDY;  Surgeon: Daneil Dolin, MD;  [facility];  Service: Endoscopy;  Laterality: N/A;  7:30am  . MULTIPLE TOOTH EXTRACTIONS     60's  . PARATHYROIDECTOMY    . POLYPECTOMY  12/08/2019   Procedure: POLYPECTOMY;  Surgeon:  Daneil Dolin, MD;  [facility];  Service: Endoscopy;;  . TOTAL HIP ARTHROPLASTY Right 10/29/2013   Procedure: RIGHT TOTAL HIP ARTHROPLASTY ANTERIOR APPROACH;  Surgeon: Mauri Pole, MD;  [facility]: [facility];  Service: Orthopedics;  Laterality: Right;    There were no vitals filed for this visit.   Subjective Assessment - 04/03/20 0943    Subjective COVID-19 screen performed prior to patient entering clinic.  Patient arrives doing well with no pain in left shoulder.    Pertinent History closed reduction of anterior shoulder dislocation 01/22/2020, HTN, osteopenia, DM 2,    Limitations House hold activities;Lifting    Diagnostic tests x-ray: see imaging    Patient Stated Goals improve movement    Currently in Pain? No/denies              [facility] PT Assessment - 04/03/20 0001      Assessment   Medical Diagnosis Anterior dislocation of left shoulder, subsequent encounter    Referring Provider (PT) Sanjuana Kava, MD    Onset Date/Surgical Date 01/22/20    Hand Dominance Right    Next MD Visit 04/16/2020    Prior Therapy no      Precautions   Precautions --                         [facility] Adult PT  Treatment/Exercise - 04/03/20 0001      Exercises   Exercises Shoulder      Shoulder Exercises: Pulleys   Flexion 5 minutes    Other Pulley Exercises Seated UE Ranger x 5 minutes.      Shoulder Exercises: Isometric Strengthening   Flexion Other (comment)   10x10"   Extension Other (comment)   10x 10"   External Rotation Other (comment)   10x 10"   Internal Rotation Other (comment)   10x 10"   ABduction Other (comment)   10x 10"   ADduction Other (comment)   10x 10"     Vasopneumatic   Number Minutes Vasopneumatic  10 minutes    Vasopnuematic Location  Shoulder    Vasopneumatic Pressure Low    Vasopneumatic Temperature  34 for edema      Manual Therapy   Manual Therapy Passive ROM;Soft tissue mobilization    Joint Mobilization gentle inferior L  shoulder joint mobs to improve ROM.    Passive ROM gentle PROM for left shoudler and  rhythmic stabs for IR/ER                    PT Short Term Goals - 04/01/20 1304      PT SHORT TERM GOAL #1   Title Patient will be independent with HEP    Baseline Doing daily 04/01/20    Time 4    Period Weeks    Status Achieved      PT SHORT TERM GOAL #2   Title Patient will demonstrate 130+ degrees of left shoulder PROM to improve shoulder mobility    Baseline PROM 130 degrees 04/01/20    Time 4    Period Weeks    Status Achieved      PT SHORT TERM GOAL #3   Title Patient will demonsrrate 45+ degrees of left shoulder PROM to improve shoulder mobility.    Baseline AROM 55 degrees 04/01/20    Time 4    Period Weeks    Status Achieved             PT Long Term Goals - 04/01/20 1305      PT LONG TERM GOAL #1   Title Patient will be independent with advanced HEP    Time 8    Period Weeks    Status On-going      PT LONG TERM GOAL #2   Title Patient will demonstrate 130+ degrees of left shoulder flexion AROM to improve ability to perform overhead tasks.    Baseline AROM 90 degrees 04/01/20    Time 8    Period Weeks    Status On-going      PT LONG TERM GOAL #3   Title Patient will demonstereat 50+ degrees of left shoulder ER AROM to improve donning and doffing apparel.    Baseline AROM 55 degrees 04/01/20    Time 8    Period Weeks    Status Achieved      PT LONG TERM GOAL #4   Title Patient will report ability to perform ADLs and home tasks with left shoulder pain less than or equal to 2/10.    Time 8    Period Weeks    Status On-going      PT LONG TERM GOAL #5   Title Patient will demonstrate 4/5 or greater left shoulder MMT in all planes to improve stability during functional tasks.    Time 8    Period Weeks  Status On-going                 Plan - 04/03/20 0944    Clinical Impression Statement Patient responded well to therapy session today with no reports  of increased pain but more soreness and fatigue. Patient demonstrated great form with AAROM activities and responded well to the progression of hold time with isometrics. No adverse effects upon removal of modalities.    Personal Factors and Comorbidities Age;Comorbidity 2    Comorbidities closed reduction of left anterior shoulder dislocation 01/22/2020; HTN, DM    Examination-Activity Limitations Reach Overhead;Dressing;Lift    Examination-Participation Restrictions Cleaning;Driving    Stability/Clinical Decision Making Stable/Uncomplicated    Clinical Decision Making Low    Rehab Potential Good    PT Frequency 2x / week    PT Duration 8 weeks    PT Treatment/Interventions ADLs/Self Care Home Management;Cryotherapy;Electrical Stimulation;Iontophoresis 4mg /ml Dexamethasone;Ultrasound;Moist Heat;Neuromuscular re-education;Manual techniques;Passive range of motion;Therapeutic exercise;Therapeutic activities;Patient/family education;Vasopneumatic Device    PT Next Visit Plan cont with pulleys and AAROM, avoiding active flexion and abd at this time, modalities PRN 10 weeks post incendent    PT Home Exercise Plan see patient education section    Consulted and Agree with Plan of Care Patient           Patient will benefit from skilled therapeutic intervention in order to improve the following deficits and impairments:  Decreased activity tolerance, Decreased strength, Pain, Impaired UE functional use, Postural dysfunction  Visit Diagnosis: Stiffness of left shoulder, not elsewhere classified  Acute pain of left shoulder  Muscle weakness (generalized)  Localized edema     Problem List Patient Active Problem List   Diagnosis Date Noted  . Educated about COVID-19 virus infection 02/02/2020  . Chest pain 01/03/2020  . IDA (iron deficiency anemia) 01/03/2020  . Acute on chronic blood loss anemia 12/09/2019  . Acute on chronic GI bleed on Eliquis 12/09/2019  . Iron deficiency anemia due  to chronic blood loss   . Occult blood in stools   . Primary osteoarthritis of both knees 05/14/2019  . Unprovoked DVT and Unprovoked Pulmonary Embolism -Dxed 03/2019 04/03/2019  . Sciatica of right side 10/18/2018  . Seborrheic keratosis 10/17/2016  . Hyperlipidemia associated with type 2 diabetes mellitus ([place]) 10/17/2016  . Overactive bladder 10/17/2016  . Gastroesophageal reflux disease without esophagitis 10/17/2016  . Primary osteoarthritis involving multiple joints 10/17/2016  . Chronic gout without tophus 10/17/2016  . Type 2 diabetes mellitus without complication, without long-term current use of insulin ([place]) 05/16/2016  . Essential hypertension 05/16/2016  . Hypothyroidism 05/16/2016  . Symptomatic anemia 10/30/2013  . Morbid obesity ([place]) 10/30/2013  . S/P right THA, AA 10/29/2013    Gabriela Eves, PT, DPT 04/03/2020, 10:00 AM  [facility] [place], [place], [address] Phone: [phone]   Fax:  [phone]  Name: Piera Downs MRN: [id number] Date of Birth: Mar 29, 1941

## 2020-04-07 ENCOUNTER — Other Ambulatory Visit: Payer: Self-pay

## 2020-04-07 ENCOUNTER — Ambulatory Visit: Payer: Medicare Other | Admitting: Physical Therapy

## 2020-04-07 DIAGNOSIS — M25512 Pain in left shoulder: Secondary | ICD-10-CM | POA: Diagnosis not present

## 2020-04-07 DIAGNOSIS — M25612 Stiffness of left shoulder, not elsewhere classified: Secondary | ICD-10-CM

## 2020-04-07 DIAGNOSIS — M6281 Muscle weakness (generalized): Secondary | ICD-10-CM | POA: Diagnosis not present

## 2020-04-07 DIAGNOSIS — R600 Localized edema: Secondary | ICD-10-CM | POA: Diagnosis not present

## 2020-04-07 NOTE — Therapy (Signed)
[facility] [place], [place], [address] Phone: [phone]   Fax:  [phone]  Physical Therapy Treatment  Patient Details  Name: [name] [name] MRN: [id number] Date of Birth: 1941/04/18 Referring Provider (PT): Sanjuana Kava, MD   Encounter Date: 04/07/2020   PT End of Session - 04/07/20 1310    Visit Number 15    Number of Visits 16    Date for PT Re-Evaluation 04/20/20    Authorization Type FOTO 13th visit 41%; Progress note every 10th visit; KX modifier at 15th visit    PT Start Time 0101    PT Stop Time 0144    PT Time Calculation (min) 43 min    Activity Tolerance Patient tolerated treatment well    Behavior During Therapy [facility] for tasks assessed/performed           Past Medical History:  Diagnosis Date  . Anemia   . Arthritis    Ostearthritis- hips, knees, fingers  . Diabetes mellitus without complication ([place])   . DVT (deep venous thrombosis) ([place])   . Dyspnea   . GERD (gastroesophageal reflux disease)   . Headache(784.0)    tx. Valproic acid  . Hypertension   . Hypothyroidism   . Pulmonary embolism [facility])     Past Surgical History:  Procedure Laterality Date  . ABDOMINAL HYSTERECTOMY    . BIOPSY  12/08/2019   Procedure: BIOPSY;  Surgeon: Daneil Dolin, MD;  [facility];  Service: Endoscopy;;  . CATARACT EXTRACTION, BILATERAL    . COLONOSCOPY N/A 12/08/2019   polyps (tubular adenoma), diverticulosis, colonic lipoma, no surveillance due to age  . 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  [name] N/A 01/13/2020   Procedure: GIVENS CAPSULE STUDY;  Surgeon: Daneil Dolin, MD;  [facility];  Service: Endoscopy;  Laterality: N/A;  7:30am  . MULTIPLE TOOTH EXTRACTIONS     60's  . PARATHYROIDECTOMY    . POLYPECTOMY  12/08/2019   Procedure: POLYPECTOMY;  Surgeon:  Daneil Dolin, MD;  [facility];  Service: Endoscopy;;  . TOTAL HIP ARTHROPLASTY Right 10/29/2013   Procedure: RIGHT TOTAL HIP ARTHROPLASTY ANTERIOR APPROACH;  Surgeon: Mauri Pole, MD;  [facility];  Service: Orthopedics;  Laterality: Right;    There were no vitals filed for this visit.   Subjective Assessment - 04/07/20 1309    Subjective COVID-19 screen performed prior to patient entering clinic.  Getting injections in knees soon.    Pertinent History closed reduction of anterior shoulder dislocation 01/22/2020, HTN, osteopenia, DM 2,    Limitations House hold activities;Lifting    Diagnostic tests x-ray: see imaging    Patient Stated Goals improve movement    Currently in Pain? Yes    Pain Score 2     Pain Location Shoulder    Pain Orientation Left    Pain Descriptors / Indicators Sore    Pain Type Acute pain                             [facility] Adult PT Treatment/Exercise - 04/07/20 0001      Exercises   Exercises Shoulder      Shoulder Exercises: Pulleys   Flexion 5 minutes    Other Pulley Exercises Seated UE Ranger x 5 minutes.      Shoulder Exercises: ROM/Strengthening  UBE (Upper Arm Bike) 120 RPM's x 8 minutes.      Vasopneumatic   Number Minutes Vasopneumatic  15 minutes    Vasopnuematic Location  --   Left shoulder.   Vasopneumatic Pressure Low      Manual Therapy   Manual Therapy Passive ROM    Passive ROM From seated position gentle left PROM x 5 minutes to patient's left shoulder.                    PT Short Term Goals - 04/01/20 1304      PT SHORT TERM GOAL #1   Title Patient will be independent with HEP    Baseline Doing daily 04/01/20    Time 4    Period Weeks    Status Achieved      PT SHORT TERM GOAL #2   Title Patient will demonstrate 130+ degrees of left shoulder PROM to improve shoulder mobility    Baseline PROM 130 degrees 04/01/20    Time 4    Period Weeks    Status Achieved      PT SHORT  TERM GOAL #3   Title Patient will demonsrrate 45+ degrees of left shoulder PROM to improve shoulder mobility.    Baseline AROM 55 degrees 04/01/20    Time 4    Period Weeks    Status Achieved             PT Long Term Goals - 04/01/20 1305      PT LONG TERM GOAL #1   Title Patient will be independent with advanced HEP    Time 8    Period Weeks    Status On-going      PT LONG TERM GOAL #2   Title Patient will demonstrate 130+ degrees of left shoulder flexion AROM to improve ability to perform overhead tasks.    Baseline AROM 90 degrees 04/01/20    Time 8    Period Weeks    Status On-going      PT LONG TERM GOAL #3   Title Patient will demonstereat 50+ degrees of left shoulder ER AROM to improve donning and doffing apparel.    Baseline AROM 55 degrees 04/01/20    Time 8    Period Weeks    Status Achieved      PT LONG TERM GOAL #4   Title Patient will report ability to perform ADLs and home tasks with left shoulder pain less than or equal to 2/10.    Time 8    Period Weeks    Status On-going      PT LONG TERM GOAL #5   Title Patient will demonstrate 4/5 or greater left shoulder MMT in all planes to improve stability during functional tasks.    Time 8    Period Weeks    Status On-going                 Plan - 04/07/20 1333    Clinical Impression Statement Patient did very well with the addition of AA UBE today.  She should be ready soon for wall ladder.    Personal Factors and Comorbidities Age;Comorbidity 2    Comorbidities closed reduction of left anterior shoulder dislocation 01/22/2020; HTN, DM    Examination-Activity Limitations Reach Overhead;Dressing;Lift    Examination-Participation Restrictions Cleaning;Driving    Stability/Clinical Decision Making Stable/Uncomplicated    Rehab Potential Good    PT Frequency 2x / week    PT Duration 8 weeks  PT Treatment/Interventions ADLs/Self Care Home Management;Cryotherapy;Electrical Stimulation;Iontophoresis 4mg /ml  Dexamethasone;Ultrasound;Moist Heat;Neuromuscular re-education;Manual techniques;Passive range of motion;Therapeutic exercise;Therapeutic activities;Patient/family education;Vasopneumatic Device           Patient will benefit from skilled therapeutic intervention in order to improve the following deficits and impairments:  Decreased activity tolerance, Decreased strength, Pain, Impaired UE functional use, Postural dysfunction  Visit Diagnosis: Stiffness of left shoulder, not elsewhere classified  Acute pain of left shoulder  Muscle weakness (generalized)  Localized edema     Problem List Patient Active Problem List   Diagnosis Date Noted  . Educated about COVID-19 virus infection 02/02/2020  . Chest pain 01/03/2020  . IDA (iron deficiency anemia) 01/03/2020  . Acute on chronic blood loss anemia 12/09/2019  . Acute on chronic GI bleed on Eliquis 12/09/2019  . Iron deficiency anemia due to chronic blood loss   . Occult blood in stools   . Primary osteoarthritis of both knees 05/14/2019  . Unprovoked DVT and Unprovoked Pulmonary Embolism -Dxed 03/2019 04/03/2019  . Sciatica of right side 10/18/2018  . Seborrheic keratosis 10/17/2016  . Hyperlipidemia associated with type 2 diabetes mellitus ([place]) 10/17/2016  . Overactive bladder 10/17/2016  . Gastroesophageal reflux disease without esophagitis 10/17/2016  . Primary osteoarthritis involving multiple joints 10/17/2016  . Chronic gout without tophus 10/17/2016  . Type 2 diabetes mellitus without complication, without long-term current use of insulin ([place]) 05/16/2016  . Essential hypertension 05/16/2016  . Hypothyroidism 05/16/2016  . Symptomatic anemia 10/30/2013  . Morbid obesity ([place]) 10/30/2013  . S/P right THA, AA 10/29/2013    Tamkia Temples, [place] MPT 04/07/2020, 1:45 PM  [facility] [address] [place], [place], [address] Phone: [phone]   Fax:  [phone]  Name: [name] [name] MRN: [id number] Date of Birth: Dec 14, 1940

## 2020-04-08 DIAGNOSIS — M170 Bilateral primary osteoarthritis of knee: Secondary | ICD-10-CM | POA: Diagnosis not present

## 2020-04-10 ENCOUNTER — Other Ambulatory Visit: Payer: Self-pay

## 2020-04-10 ENCOUNTER — Ambulatory Visit: Payer: Medicare Other | Admitting: Physical Therapy

## 2020-04-10 DIAGNOSIS — M25612 Stiffness of left shoulder, not elsewhere classified: Secondary | ICD-10-CM

## 2020-04-10 DIAGNOSIS — M6281 Muscle weakness (generalized): Secondary | ICD-10-CM | POA: Diagnosis not present

## 2020-04-10 DIAGNOSIS — M25512 Pain in left shoulder: Secondary | ICD-10-CM

## 2020-04-10 DIAGNOSIS — R600 Localized edema: Secondary | ICD-10-CM

## 2020-04-10 NOTE — Therapy (Addendum)
[facility] [place], [place], [address] Phone: [phone]   Fax:  [phone]  Physical Therapy Treatment PHYSICAL THERAPY DISCHARGE SUMMARY  Visits from Start of Care: 16  Current functional level related to goals / functional outcomes: See below   Remaining deficits: See goals   Education / Equipment: HEP Plan: Patient agrees to discharge.  Patient goals were partially met. Patient is being discharged due to not returning since the last visit.  ?????    Gabriela Eves, PT, DPT   Patient Details  Name: Bayler Nehring MRN: [id number] Date of Birth: 09/07/1940 Referring Provider (PT): Sanjuana Kava, MD   Encounter Date: 04/10/2020   PT End of Session - 04/10/20 1029    Visit Number 16    Number of Visits 16    Date for PT Re-Evaluation 04/20/20    Authorization Type FOTO 16th visit 45%; Progress note every 10th visit; KX modifier at 15th visit    PT Start Time [place] During Treatment Other (comment)    Activity Tolerance Patient tolerated treatment well    Behavior During Therapy [facility] for tasks assessed/performed           Past Medical History:  Diagnosis Date  . Anemia   . Arthritis    Ostearthritis- hips, knees, fingers  . Diabetes mellitus without complication ([place])   . DVT (deep venous thrombosis) ([place])   . Dyspnea   . GERD (gastroesophageal reflux disease)   . Headache(784.0)    tx. Valproic acid  . Hypertension   . Hypothyroidism   . Pulmonary embolism [facility])     Past Surgical History:  Procedure Laterality Date  . ABDOMINAL HYSTERECTOMY    . BIOPSY  12/08/2019   Procedure: BIOPSY;  Surgeon: Daneil Dolin, MD;  [facility];  Service: Endoscopy;;  . CATARACT EXTRACTION, BILATERAL    . COLONOSCOPY N/A 12/08/2019   polyps (tubular adenoma), diverticulosis, colonic lipoma, no surveillance due to age  . 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  [name] N/A 01/13/2020   Procedure: GIVENS CAPSULE STUDY;  Surgeon: Daneil Dolin, MD;  [facility];  Service: Endoscopy;  Laterality: N/A;  7:30am  . MULTIPLE TOOTH EXTRACTIONS     60's  . PARATHYROIDECTOMY    . POLYPECTOMY  12/08/2019   Procedure: POLYPECTOMY;  Surgeon: Daneil Dolin, MD;  Location[facility];  Service: Endoscopy;;  . TOTAL HIP ARTHROPLASTY Right 10/29/2013   Procedure: RIGHT TOTAL HIP ARTHROPLASTY ANTERIOR APPROACH;  Surgeon: Mauri Pole, MD;  Location: [facility] ORS;  Service: Orthopedics;  Laterality: Right;    There were no vitals filed for this visit.       [facility] PT Assessment - 04/10/20 0001      Assessment   Medical Diagnosis Anterior dislocation of left shoulder, subsequent encounter    Referring Provider (PT) Sanjuana Kava, MD    Onset Date/Surgical Date 01/22/20    Hand Dominance Right    Next MD Visit 04/16/2020    Prior Therapy no      Observation/Other Assessments   Focus on Therapeutic Outcomes (FOTO)  45% limitation      PROM   PROM Assessment Site Shoulder    Right/Left Shoulder Left    Left Shoulder Flexion 144 Degrees    Left Shoulder Internal Rotation 45 Degrees    Left Shoulder External Rotation  [facility] Adult PT Treatment/Exercise - 04/10/20 0001      Exercises   Exercises Shoulder      Shoulder Exercises: Supine   Protraction AROM;Both;15 reps    External Rotation AAROM;Both;15 reps      Shoulder Exercises: Pulleys   Flexion 5 minutes      Shoulder Exercises: ROM/Strengthening   UBE (Upper Arm Bike) 120 RPM's x 8 minutes.    Other ROM/Strengthening Exercises wall ladder x3 minutes level 17      Vasopneumatic   Number Minutes Vasopneumatic  10 minutes    Vasopnuematic Location  Shoulder    Vasopneumatic Pressure Low    Vasopneumatic Temperature  34 for edema       Manual Therapy   Manual Therapy Passive ROM    Passive ROM PROM into ER, IR, and flexion to improve ROM                    PT Short Term Goals - 04/01/20 1304      PT SHORT TERM GOAL #1   Title Patient will be independent with HEP    Baseline Doing daily 04/01/20    Time 4    Period Weeks    Status Achieved      PT SHORT TERM GOAL #2   Title Patient will demonstrate 130+ degrees of left shoulder PROM to improve shoulder mobility    Baseline PROM 130 degrees 04/01/20    Time 4    Period Weeks    Status Achieved      PT SHORT TERM GOAL #3   Title Patient will demonsrrate 45+ degrees of left shoulder PROM to improve shoulder mobility.    Baseline AROM 55 degrees 04/01/20    Time 4    Period Weeks    Status Achieved             PT Long Term Goals - 04/10/20 1024      PT LONG TERM GOAL #1   Title Patient will be independent with advanced HEP    Time 8    Period Weeks    Status Achieved      PT LONG TERM GOAL #2   Title Patient will demonstrate 130+ degrees of left shoulder flexion AROM to improve ability to perform overhead tasks.    Baseline AROM 90 degrees 04/01/20 PROM 144 degrees    Time 8    Period Weeks    Status On-going      PT LONG TERM GOAL #3   Title Patient will demonstereat 50+ degrees of left shoulder ER AROM to improve donning and doffing apparel.    Baseline AROM 55 degrees 04/01/20 PROM 70 degrees    Time 8    Period Weeks    Status Achieved      PT LONG TERM GOAL #4   Title Patient will report ability to perform ADLs and home tasks with left shoulder pain less than or equal to 2/10.    Time 8    Period Weeks    Status Achieved      PT LONG TERM GOAL #5   Title Patient will demonstrate 4/5 or greater left shoulder MMT in all planes to improve stability during functional tasks.    Time 8    Period Weeks    Status On-going  Plan - 04/10/20 1029    Clinical Impression Statement Patient responded well to the  progression of TEs today with just reports of fatigue. Patient responded very well to PROM with improvements in PROM, see objective measurements. Patient to see MD on Thursday 04/16/2020. No adverse effects upon removal of modalities.    Personal Factors and Comorbidities Age;Comorbidity 2    Comorbidities closed reduction of left anterior shoulder dislocation 01/22/2020; HTN, DM    Examination-Activity Limitations Reach Overhead;Dressing;Lift    Examination-Participation Restrictions Cleaning;Driving    Stability/Clinical Decision Making Stable/Uncomplicated    Rehab Potential Good    PT Frequency 2x / week    PT Duration 8 weeks    PT Treatment/Interventions ADLs/Self Care Home Management;Cryotherapy;Electrical Stimulation;Iontophoresis 49m/ml Dexamethasone;Ultrasound;Moist Heat;Neuromuscular re-education;Manual techniques;Passive range of motion;Therapeutic exercise;Therapeutic activities;Patient/family education;Vasopneumatic Device    PT Next Visit Plan Continue pending MD discretion. To see MD on 04/16/2020    PT Home Exercise Plan see patient education section    Consulted and Agree with Plan of Care Patient           Patient will benefit from skilled therapeutic intervention in order to improve the following deficits and impairments:  Decreased activity tolerance, Decreased strength, Pain, Impaired UE functional use, Postural dysfunction  Visit Diagnosis: Stiffness of left shoulder, not elsewhere classified  Acute pain of left shoulder  Muscle weakness (generalized)  Localized edema     Problem List Patient Active Problem List   Diagnosis Date Noted  . Educated about COVID-19 virus infection 02/02/2020  . Chest pain 01/03/2020  . IDA (iron deficiency anemia) 01/03/2020  . Acute on chronic blood loss anemia 12/09/2019  . Acute on chronic GI bleed on Eliquis 12/09/2019  . Iron deficiency anemia due to chronic blood loss   . Occult blood in stools   . Primary  osteoarthritis of both knees 05/14/2019  . Unprovoked DVT and Unprovoked Pulmonary Embolism -Dxed 03/2019 04/03/2019  . Sciatica of right side 10/18/2018  . Seborrheic keratosis 10/17/2016  . Hyperlipidemia associated with type 2 diabetes mellitus ([place] 10/17/2016  . Overactive bladder 10/17/2016  . Gastroesophageal reflux disease without esophagitis 10/17/2016  . Primary osteoarthritis involving multiple joints 10/17/2016  . Chronic gout without tophus 10/17/2016  . Type 2 diabetes mellitus without complication, without long-term current use of insulin ([place] 05/16/2016  . Essential hypertension 05/16/2016  . Hypothyroidism 05/16/2016  . Symptomatic anemia 10/30/2013  . Morbid obesity ([place] 10/30/2013  . S/P right THA, AA 10/29/2013    KGabriela Eves PT, DPT 04/10/2020, 11:06 AM  [facility] [address]Phone: [phone]  Fax:  [phone] Name: GFransheska WillinghamMRN: [id number] of Birth: 1Jul 18, 1942

## 2020-04-16 ENCOUNTER — Encounter: Payer: Self-pay | Admitting: Orthopaedic Surgery

## 2020-04-16 ENCOUNTER — Other Ambulatory Visit: Payer: Self-pay

## 2020-04-16 ENCOUNTER — Ambulatory Visit: Payer: Medicare Other | Admitting: Orthopaedic Surgery

## 2020-04-16 VITALS — BP 193/97 | HR 79 | Ht 63.0 in | Wt 208.0 lb

## 2020-04-16 DIAGNOSIS — S43015D Anterior dislocation of left humerus, subsequent encounter: Secondary | ICD-10-CM

## 2020-04-16 DIAGNOSIS — M170 Bilateral primary osteoarthritis of knee: Secondary | ICD-10-CM | POA: Diagnosis not present

## 2020-04-16 NOTE — Progress Notes (Signed)
I am much better.  She has completed the PT/OT of the left shoulder.  She has no pain and it is stable.  NV intact.  I have reviewed the PT notes.  Encounter Diagnosis  Name Primary?  Marland Kitchen Anterior dislocation of left shoulder, subsequent encounter Yes   I will see her as needed.  Continue her exercises at home.  Call if any problem.  Precautions discussed.   Electronically Signed Sanjuana Kava, MD 9/23/20219:56 AM

## 2020-04-22 DIAGNOSIS — M170 Bilateral primary osteoarthritis of knee: Secondary | ICD-10-CM | POA: Diagnosis not present

## 2020-04-24 ENCOUNTER — Other Ambulatory Visit: Payer: Self-pay | Admitting: Family Medicine

## 2020-04-24 DIAGNOSIS — M1A9XX0 Chronic gout, unspecified, without tophus (tophi): Secondary | ICD-10-CM

## 2020-04-24 DIAGNOSIS — E039 Hypothyroidism, unspecified: Secondary | ICD-10-CM

## 2020-04-24 DIAGNOSIS — I10 Essential (primary) hypertension: Secondary | ICD-10-CM

## 2020-04-27 ENCOUNTER — Encounter: Payer: Self-pay | Admitting: *Deleted

## 2020-04-27 ENCOUNTER — Other Ambulatory Visit: Payer: Self-pay | Admitting: *Deleted

## 2020-04-27 DIAGNOSIS — D509 Iron deficiency anemia, unspecified: Secondary | ICD-10-CM

## 2020-05-01 ENCOUNTER — Telehealth: Payer: Self-pay | Admitting: Cardiology

## 2020-05-01 NOTE — Telephone Encounter (Signed)
Increase Norvasc to 7.5 mg daily.

## 2020-05-01 NOTE — Telephone Encounter (Signed)
Spoke to patient she stated her B/P has been elevated 170/89,149/92,153/86,134/76. Pulse [id number].Stated she feels ok.She is taking all medications as prescribed.Advised I will send message to Dr.Hochrein for advice.

## 2020-05-01 NOTE — Telephone Encounter (Signed)
Pt c/o BP issue: STAT if pt c/o blurred vision, one-sided weakness or slurred speech  1. What are your last 5 BP readings? 154/73 45 around noon today, rechecked a few minutes later 170/89 HR 79  2. Are you having any other symptoms (ex. Dizziness, headache, blurred vision, passed out)? no  3. What is your BP issue? Glenard Haring, Therapist, sports, from [employer] the patient's BP has been creeping up. She states the patient has not been doing anything differently and is not having any symptoms. She states she also has not heard anything about her monitor results.

## 2020-05-04 DIAGNOSIS — D509 Iron deficiency anemia, unspecified: Secondary | ICD-10-CM | POA: Diagnosis not present

## 2020-05-04 MED ORDER — AMLODIPINE BESYLATE 2.5 MG PO TABS: ORAL_TABLET | ORAL | 3 refills | Status: DC

## 2020-05-04 MED ORDER — AMLODIPINE BESYLATE 5 MG PO TABS: ORAL_TABLET | ORAL | 3 refills | Status: DC

## 2020-05-04 NOTE — Telephone Encounter (Signed)
Spoke to patient Dr.Hochrein advised to increase Amlodipine to 7.5 mg daily.Advised to continue to monitor B/P and call back if elevated.

## 2020-05-05 ENCOUNTER — Other Ambulatory Visit: Payer: Medicare Other

## 2020-05-05 ENCOUNTER — Other Ambulatory Visit: Payer: Self-pay

## 2020-05-05 ENCOUNTER — Ambulatory Visit: Payer: Medicare Other | Admitting: Gastroenterology

## 2020-05-05 ENCOUNTER — Encounter: Payer: Self-pay | Admitting: Gastroenterology

## 2020-05-05 VITALS — BP 145/83 | HR 69 | Temp 97.5°F | Ht 63.0 in | Wt 207.8 lb

## 2020-05-05 DIAGNOSIS — D509 Iron deficiency anemia, unspecified: Secondary | ICD-10-CM | POA: Diagnosis not present

## 2020-05-05 NOTE — Progress Notes (Signed)
Referring Provider: Loman Brooklyn, FNP Primary Care Physician:  Loman Brooklyn, FNP Primary GI: Dr. Gala Romney   Chief Complaint  Patient presents with  . Anemia    f/u    HPI:   [name] [name] is a 79 y.o. 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  Received 2 units blood while inpatient.   US abdomen with fatty liver. Capsule study with scattered small bowel erosions, abnormal small bowel also noted with edema, inflammation/erosion. CTA with SMA noting 50% stenosis, IMA and celiac patent. CTE was recommended to complete small bowel work-up. She injured her shoulder and has had to postpone this but would like to complete in near future.   Most recent labs on file from Aug 2021 with Hgb 9.5, ferritin remaining low at 7. Labs pending from today.    Weight fluctuates at times.    Iron infusion June 2021. Labs done this morning. Ferrous sulfate 325 mg orally BID now. Stool dark on iron.   Past Medical History:  Diagnosis Date  . Anemia   . Arthritis    Ostearthritis- hips, knees, fingers  . Diabetes mellitus without complication ([place])   . DVT (deep venous thrombosis) ([place])   . Dyspnea   . GERD (gastroesophageal reflux disease)   . Headache(784.0)    tx. Valproic acid  . Hypertension   . Hypothyroidism   . Pulmonary embolism [facility])     Past Surgical History:  Procedure Laterality Date  . ABDOMINAL HYSTERECTOMY    . BIOPSY  12/08/2019   Procedure: BIOPSY;  Surgeon: Daneil Dolin, MD;  [facility];  Service: Endoscopy;;  . CATARACT EXTRACTION, BILATERAL    . COLONOSCOPY N/A 12/08/2019   polyps (tubular adenoma), diverticulosis, colonic lipoma, no surveillance due to age    . 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  [name] N/A 01/13/2020   Procedure: GIVENS CAPSULE STUDY;  Surgeon: Daneil Dolin, MD;  [facility];  Service: Endoscopy;  Laterality: N/A;  7:30am  . MULTIPLE TOOTH EXTRACTIONS     60's  . PARATHYROIDECTOMY    . POLYPECTOMY  12/08/2019   Procedure: POLYPECTOMY;  Surgeon: Daneil Dolin, MD;  [facility];  Service: Endoscopy;;  . TOTAL HIP ARTHROPLASTY Right 10/29/2013   Procedure: RIGHT TOTAL HIP ARTHROPLASTY ANTERIOR APPROACH;  Surgeon: Mauri Pole, MD;  [facility];  Service: Orthopedics;  Laterality: Right;    Current Outpatient Medications  Medication Sig Dispense Refill  . acetaminophen (TYLENOL) 325 MG tablet Take 2 tablets (650 mg total) by mouth every 6 (six) hours as needed for mild pain or headache (or Fever >/= 101). 12 tablet 0  . allopurinol (ZYLOPRIM) 100 MG tablet TAKE 1 TABLET BY MOUTH  DAILY 90 tablet 0  . amLODipine (NORVASC) 2.5 MG tablet Take 2.5 mg daily along with a 5 mg daily  ( 7.5 mg ) Total 90 tablet 3  . amLODipine (NORVASC) 5 MG tablet Take 5 mg daily along with 2.5 mg daily ( 7.5 mg ) total 90 tablet 3  . apixaban (ELIQUIS) 2.5 MG TABS tablet Take 1 tablet (2.5 mg total) by mouth 2 (two) times daily. 180 tablet  3  . Cholecalciferol (VITAMIN D-3) 5000 UNITS TABS Take 5,000 tablets by mouth daily.    . Colchicine 0.6 MG CAPS Take 1.2 mg (2 capsules) by mouth x1, then 0.6 mg (1 capsule) 1 hour later x1 as needed for gout flare. 9 capsule 2  . diclofenac sodium (VOLTAREN) 1 % GEL Apply 4 g topically 4 (four) times daily. 200 g 11  . furosemide (LASIX) 20 MG tablet TAKE 1 TABLET BY MOUTH  TWICE DAILY 180 tablet 0  . levothyroxine (SYNTHROID) 25 MCG tablet TAKE 1 TABLET BY MOUTH  DAILY BEFORE BREAKFAST 90 tablet 1  . loratadine (CLARITIN) 10 MG tablet Take 10 mg by mouth daily.     [name] 100 MG tablet TAKE 1 TABLET BY MOUTH IN  THE MORNING 90 tablet 0  . metFORMIN (GLUCOPHAGE) 500 MG tablet Take 1 tablet (500 mg total) by mouth 2 (two) times daily with a meal. Give w/food. 180 tablet 1  . ONE TOUCH ULTRA TEST test strip USE UP TO FOUR TIMES DAILY AS DIRECTED 400 each 3  . oxybutynin (DITROPAN) 5 MG tablet Take 1 tablet (5 mg total) by mouth 3 (three) times daily. Needs to be seen for further refills 270 tablet 0  . pantoprazole (PROTONIX) 40 MG tablet Take 40 mg by mouth daily.    . simvastatin (ZOCOR) 40 MG tablet Take 1 tablet (40 mg total) by mouth every evening. Needs to be seen for further refills. 90 tablet 1   No current facility-administered medications for this visit.    Allergies as of 05/05/2020  . (No Known Allergies)    Family History  Problem Relation Age of Onset  . Colitis Sister 44       alive  . Cancer Sister 50       colon  . Cancer Mother 64       uterine  . Heart disease Father 21       heart failure  . Heart attack Brother 74  . Healthy Daughter   . Healthy Son   . Pulmonary embolism Brother 61  . Heart disease Brother   . Healthy Son   . Healthy Son     Social History   Socioeconomic History  . Marital status: Widowed    Spouse name: Not on file  . Number of children: 4  . Years of education: 77  . Highest education level: High school graduate  Occupational History  . 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 Smoking status: Never Smoker  . Smokeless tobacco: Never Used  Vaping Use  . Vaping Use: Never used  Substance and Sexual Activity  . Alcohol use: No    Alcohol/week: 0.0 standard drinks  . Drug use: No  . Sexual activity: Not Currently  Other Topics Concern  . Not on file  Social History Narrative   Patient is widowed and lives in a one story home. She has four adult children and one son lives with her.    Social Determinants of Health   Financial Resource Strain: Low  Risk   . Difficulty of Paying Living Expenses: Not hard at all  Food Insecurity: No Food Insecurity  . Worried About Charity fundraiser in the Last Year: Never true  . Ran Out of Food in the Last Year: Never true  Transportation Needs: No Transportation Needs  . Lack of Transportation (Medical): No  . Lack of Transportation (Non-Medical): No  Physical  Activity: Sufficiently Active  . Days of Exercise per Week: 7 days  . Minutes of Exercise per Session: 30 min  Stress: No Stress Concern Present  . Feeling of Stress : Not at all  Social Connections: Socially Integrated  . Frequency of Communication with Friends and Family: More than three times a week  . Frequency of Social Gatherings with Friends and Family: More than three times a week  . Attends Religious Services: More than 4 times per year  . Active Member of Clubs or Organizations: Yes  . Attends Archivist Meetings: More than 4 times per year  . Marital Status: Married    Review of Systems: Gen: Denies fever, chills, anorexia. Denies fatigue, weakness, weight loss.  CV: Denies chest pain, palpitations, syncope, peripheral edema, and claudication. Resp: Denies dyspnea at rest, cough, wheezing, coughing up blood, and pleurisy. GI: see HPI Derm: Denies rash, itching, dry skin Psych: Denies depression, anxiety, memory loss, confusion. No homicidal or suicidal ideation.  Heme: Denies bruising, bleeding, and enlarged lymph nodes.  Physical Exam: BP (!) 145/83   Pulse 69   Temp (!) 97.5 F (36.4 C)   Ht 5\' 3"  (1.6 m)   Wt 207 lb 12.8 oz (94.3 kg)   BMI 36.81 kg/m  General:   Alert and oriented. No distress noted. Pleasant and cooperative.  Head:  Normocephalic and atraumatic. Eyes:  Conjuctiva clear without scleral icterus. Mouth:  Mask in place Abdomen:  +BS, soft, non-tender and non-distended. No rebound or guarding. No HSM or masses noted. Msk:  Symmetrical without gross deformities. Normal  posture. Extremities:  Without edema. Neurologic:  Alert and  oriented x4 Psych:  Alert and cooperative. Normal mood and affect.  ASSESSMENT: [name] [name] is a 80 y.o. female presenting today with history of IDA, s/p EGD/colonoscopy, and capsule study as noted above. Abnormal capsule study with scattered small bowel erosions, abnormal small bowel with edema, inflammation/erosions. CTA with SMA noting 50% stenosis, IMA and celiac patent. CTE has been recommended complete evaluation, which she will be rescheduling in the near future.  Remains on oral iron currently. CBC and iron studies pending today. No overt GI bleeding.    PLAN:  Await labs pending Continue iron for now CTE to be completed in near future 6 month return   [name], PhD, [facility]

## 2020-05-05 NOTE — Patient Instructions (Signed)
Please call to schedule the CT enterography at your convenience.  I will be looking out for your blood work!  We will see you in 6 months or sooner if needed!   I enjoyed seeing you again today! As you know, I value our relationship and want to provide genuine, compassionate, and quality care. I welcome your feedback. If you receive a survey regarding your visit,  I greatly appreciate you taking time to fill this out. See you next time!  [name], PhD, ANP-BC [facility]

## 2020-05-06 LAB — CBC WITH DIFFERENTIAL/PLATELET
Basophils Absolute: 0 10*3/uL (ref 0.0–0.2)
Basos: 1 %
EOS (ABSOLUTE): 0.2 10*3/uL (ref 0.0–0.4)
Eos: 3 %
Hematocrit: 38.6 % (ref 34.0–46.6)
Hemoglobin: 12.4 g/dL (ref 11.1–15.9)
Immature Grans (Abs): 0 10*3/uL (ref 0.0–0.1)
Immature Granulocytes: 0 %
Lymphocytes Absolute: 1.8 10*3/uL (ref 0.7–3.1)
Lymphs: 32 %
MCH: 26.2 pg — ABNORMAL LOW (ref 26.6–33.0)
MCHC: 32.1 g/dL (ref 31.5–35.7)
MCV: 82 fL (ref 79–97)
Monocytes Absolute: 0.9 10*3/uL (ref 0.1–0.9)
Monocytes: 15 %
Neutrophils Absolute: 3 10*3/uL (ref 1.4–7.0)
Neutrophils: 49 %
Platelets: 224 10*3/uL (ref 150–450)
RBC: 4.73 x10E6/uL (ref 3.77–5.28)
RDW: 23.7 % — ABNORMAL HIGH (ref 11.7–15.4)
WBC: 5.8 10*3/uL (ref 3.4–10.8)

## 2020-05-06 LAB — IRON,TIBC AND FERRITIN PANEL
Ferritin: 42 ng/mL (ref 15–150)
Iron Saturation: <5 % — CL (ref 15–55)
Iron: 36 ug/dL (ref 27–139)
Total Iron Binding Capacity: >736 ug/dL (ref 250–450)
UIBC: >700 ug/dL — ABNORMAL HIGH (ref 118–369)

## 2020-05-08 ENCOUNTER — Ambulatory Visit: Payer: Medicare Other | Admitting: Gastroenterology

## 2020-05-20 ENCOUNTER — Ambulatory Visit: Payer: Medicare Other | Admitting: *Deleted

## 2020-05-20 DIAGNOSIS — M8949 Other hypertrophic osteoarthropathy, multiple sites: Secondary | ICD-10-CM

## 2020-05-20 DIAGNOSIS — S43005D Unspecified dislocation of left shoulder joint, subsequent encounter: Secondary | ICD-10-CM

## 2020-05-20 DIAGNOSIS — M159 Polyosteoarthritis, unspecified: Secondary | ICD-10-CM

## 2020-05-20 DIAGNOSIS — D500 Iron deficiency anemia secondary to blood loss (chronic): Secondary | ICD-10-CM

## 2020-05-20 NOTE — Patient Instructions (Signed)
Visit Information  Goals Addressed              This Visit's Progress     Patient Stated     "I need to find out why I'm anemic" (pt-stated)         CARE PLAN ENTRY (see longitudinal plan of care for additional care plan information)  Current Barriers:   Chronic Disease Management support and education needs related to anemia  On Eliquis for Afib  Neg colonoscopy and endosocpy  Nurse Case Manager Clinical Goal(s):   Over the next 30 days, patient will work with gastroenterologist to address needs related to anemia  Over the next 30 days, patient will reach out to [place] as needed to assist with care coordination  Interventions:   Inter-disciplinary care team collaboration (see longitudinal plan of care)  Chart reviewed including recent office notes, imaging reports, and lab results  Discussed recent lab results and imaging reports with patient o Hgb is normal at 12.4  Discussed need for further imaging with a CTE as recommended by GI o Patient plans to schedule in November  Reviewed and discussed medications: Eliquis and iron  Provided with RNCM contact information and encouraged to reach out as needed  Encouraged patient to reach out to GI or PCP with any new or worsening symptoms  Patient Self Care Activities:   Performs ADL's independently  Performs IADL's independently  Please see past updates related to this goal by clicking on the "Past Updates" button in the selected goal        "I want my shoulder to keep getting better" (pt-stated)        [place] (see longitudinal plan of care for additional care plan information)  Current Barriers:   Care Coordination needs related to left shoulder pain and decreased ROM s/p anterior dislocation in a patient with osteoporosis and arthritis  Nurse Case Manager Clinical Goal(s):   Over the next 90 days, patient will contact Dr Luna Glasgow with any new or worsening symptoms  Over the next 90  days, patient will see improvement in left shoulder pain and ROM  Interventions:   Inter-disciplinary care team collaboration (see longitudinal plan of care)  Chart reviewed including recent office notes, PT notes, and imaging reports  Discussed anterior shoulder dislocation in June 2020  Discussed shoulder reduction and Physical Therapy  Discussed pain and decreased ROM o This has improved but is not back to normal o Passive ROM is greater than active ROM  Encouraged patient to continue the exercises PT gave her at least a few days a week  Encouraged patient to reach out to ortho, Dr Luna Glasgow, with any new or worsening symptoms  Discussed patient's anxiety/concern regarding the possibility of it dislocating again since there was no known cause for it  Encouraged patient to reach out to RN CM as needed  Patient Self Care Activities:   Performs ADL's independently  Performs IADL's independently  Initial goal documentation       Other     Osteoporosis Management   Not on track     [place] (see longtitudinal plan of care for additional care plan information)  Current Barriers:   Chronic Disease Management support and education needs related to osteoporosis  Nurse Case Manager Clinical Goal(s):   Over the next 90 days, patient will have bone density scan performed  Over the next 90 days, patient will f/u with Clinical Pharmacist or PCP regarding osteoporosis management  Interventions:  Inter-disciplinary care team collaboration (see longtitudinal plan of care)  Chart reviewed, including office notes, imaging reports, and lab results  Evaluation of current treatment plan related to osteoporosis and patient's adherence to plan as established by provider.  Reviewed medications o Calcium and Vit D o No longer taking fosamax   Reviewed last dexa scan from 2019  Will collaborate with [facility] clinical staff to schedule dexa scan  o May be able to have done  at next office visit if she feels up to it  Reviewed upcoming appts: Dr Livia Snellen on 06/04/20  Patient Self Care Activities:   Performs ADL's independently  Performs IADL's independently  Please see past updates related to this goal by clicking on the "Past Updates" button in the selected goal         Patient verbalizes understanding of instructions provided today.   Follow-up Plan The care management team will reach out to the patient again over the next 60 days.  Follow-up with GI regarding CTE  Follow-up with ortho as needed for left shoulder pain and decreased ROM Dexa scan at [place], BSN, RN-BC [place] [facility] [place] Management Direct Dial: [phone]

## 2020-05-20 NOTE — Chronic Care Management (AMB) (Addendum)
Chronic Care Management   Follow Up Note   05/20/2020 Name: [name] [name] MRN: [id number] DOB: 06/05/1941  Referred by: Loman Brooklyn, FNP Reason for referral : Chronic Care Management (RN follow up)   [name] [name] is a 79 y.o. year old female who is a primary care patient of Loman Brooklyn, FNP. The CCM team was consulted for assistance with chronic disease management and care coordination needs.    Review of patient status, including review of consultants reports, relevant laboratory and other test results, and collaboration with appropriate care team members and the patient's provider was performed as part of comprehensive patient evaluation and provision of chronic care management services.    SDOH (Social Determinants of Health) assessments performed: No See Care Plan activities for detailed interventions related to [facility])     Outpatient Encounter Medications as of 05/20/2020  Medication Sig  . acetaminophen (TYLENOL) 325 MG tablet Take 2 tablets (650 mg total) by mouth every 6 (six) hours as needed for mild pain or headache (or Fever >/= 101).  Marland Kitchen allopurinol (ZYLOPRIM) 100 MG tablet TAKE 1 TABLET BY MOUTH  DAILY  . amLODipine (NORVASC) 2.5 MG tablet Take 2.5 mg daily along with a 5 mg daily  ( 7.5 mg ) Total  . amLODipine (NORVASC) 5 MG tablet Take 5 mg daily along with 2.5 mg daily ( 7.5 mg ) total  . apixaban (ELIQUIS) 2.5 MG TABS tablet Take 1 tablet (2.5 mg total) by mouth 2 (two) times daily.  . Cholecalciferol (VITAMIN D-3) 5000 UNITS TABS Take 5,000 tablets by mouth daily.  . Colchicine 0.6 MG CAPS Take 1.2 mg (2 capsules) by mouth x1, then 0.6 mg (1 capsule) 1 hour later x1 as needed for gout flare.  . diclofenac sodium (VOLTAREN) 1 % GEL Apply 4 g topically 4 (four) times daily.  . furosemide (LASIX) 20 MG tablet TAKE 1 TABLET BY MOUTH  TWICE DAILY  . levothyroxine (SYNTHROID) 25 MCG tablet TAKE 1 TABLET BY MOUTH  DAILY BEFORE BREAKFAST  . loratadine  (CLARITIN) 10 MG tablet Take 10 mg by mouth daily.  [name] 100 MG tablet TAKE 1 TABLET BY MOUTH IN  THE MORNING  . metFORMIN (GLUCOPHAGE) 500 MG tablet Take 1 tablet (500 mg total) by mouth 2 (two) times daily with a meal. Give w/food.  . ONE TOUCH ULTRA TEST test strip USE UP TO FOUR TIMES DAILY AS DIRECTED  . oxybutynin (DITROPAN) 5 MG tablet Take 1 tablet (5 mg total) by mouth 3 (three) times daily. Needs to be seen for further refills  . pantoprazole (PROTONIX) 40 MG tablet Take 40 mg by mouth daily.  . simvastatin (ZOCOR) 40 MG tablet Take 1 tablet (40 mg total) by mouth every evening. Needs to be seen for further refills.   No facility-administered encounter medications on file as of 05/20/2020.     Goals Addressed              This Visit's Progress     Patient Stated   .  "I need to find out why I'm anemic" (pt-stated)         CARE PLAN ENTRY (see longitudinal plan of care for additional care plan information)  Current Barriers:  . Chronic Disease Management support and education needs related to anemia . On Eliquis for Afib . Neg colonoscopy and endosocpy  Nurse Case Manager Clinical Goal(s):  Marland Kitchen Over the next 30 days, patient will work with gastroenterologist to address  needs related to anemia . Over the next 30 days, patient will reach out to [place] as needed to assist with care coordination  Interventions:  . Inter-disciplinary care team collaboration (see longitudinal plan of care) . Chart reviewed including recent office notes, imaging reports, and lab results . Discussed recent lab results and imaging reports with patient o Hgb is normal at 12.4 . Discussed need for further imaging with a CTE as recommended by GI o Patient plans to schedule in November . Reviewed and discussed medications: Eliquis and iron . Provided with RNCM contact information and encouraged to reach out as needed . Encouraged patient to reach out to GI or PCP with any  new or worsening symptoms  Patient Self Care Activities:  . Performs ADL's independently . Performs IADL's independently  Please see past updates related to this goal by clicking on the "Past Updates" button in the selected goal      .  "I want my shoulder to keep getting better" (pt-stated)        [place] (see longitudinal plan of care for additional care plan information)  Current Barriers:  . Care Coordination needs related to left shoulder pain and decreased ROM s/p anterior dislocation in a patient with osteoporosis and arthritis  Nurse Case Manager Clinical Goal(s):  Marland Kitchen Over the next 90 days, patient will contact Dr Luna Glasgow with any new or worsening symptoms . Over the next 90 days, patient will see improvement in left shoulder pain and ROM  Interventions:  . Inter-disciplinary care team collaboration (see longitudinal plan of care) . Chart reviewed including recent office notes, PT notes, and imaging reports . Discussed anterior shoulder dislocation in June 2020 . Discussed shoulder reduction and Physical Therapy . Discussed pain and decreased ROM o This has improved but is not back to normal o Passive ROM is greater than active ROM . Encouraged patient to continue the exercises PT gave her at least a few days a week . Encouraged patient to reach out to ortho, Dr Luna Glasgow, with any new or worsening symptoms . Discussed patient's anxiety/concern regarding the possibility of it dislocating again since there was no known cause for it . Encouraged patient to reach out to RN CM as needed  Patient Self Care Activities:  . Performs ADL's independently . Performs IADL's independently  Initial goal documentation       Other   .  Osteoporosis Management   Not on track     CARE PLAN ENTRY (see longtitudinal plan of care for additional care plan information)  Current Barriers:  . Chronic Disease Management support and education needs related to osteoporosis  Nurse  Case Manager Clinical Goal(s):  Marland Kitchen Over the next 90 days, patient will have bone density scan performed . Over the next 90 days, patient will f/u with Clinical Pharmacist or PCP regarding osteoporosis management  Interventions:  . Inter-disciplinary care team collaboration (see longtitudinal plan of care) . Chart reviewed, including office notes, imaging reports, and lab results . Evaluation of current treatment plan related to osteoporosis and patient's adherence to plan as established by provider. . Reviewed medications o Calcium and Vit D o No longer taking fosamax  . Reviewed last dexa scan from 2019 . Will collaborate with [facility] clinical staff to schedule dexa scan  o May be able to have done at next office visit if she feels up to it . Reviewed upcoming appts: Dr Livia Snellen on 06/04/20  Patient Self Care Activities:  .  Performs ADL's independently . Performs IADL's independently  Please see past updates related to this goal by clicking on the "Past Updates" button in the selected goal          Plan:   The care management team will reach out to the patient again over the next 60 days.  Follow-up with GI regarding CTE  Follow-up with ortho as needed for left shoulder pain and decreased ROM Dexa scan at [place], BSN, RN-BC [place] [facility] [place] Management Direct Dial: [phone]

## 2020-05-22 ENCOUNTER — Other Ambulatory Visit: Payer: Self-pay

## 2020-05-22 DIAGNOSIS — D509 Iron deficiency anemia, unspecified: Secondary | ICD-10-CM

## 2020-06-04 ENCOUNTER — Ambulatory Visit: Payer: Medicare Other | Admitting: Family Medicine

## 2020-06-04 ENCOUNTER — Ambulatory Visit (INDEPENDENT_AMBULATORY_CARE_PROVIDER_SITE_OTHER): Payer: Medicare Other | Admitting: Family Medicine

## 2020-06-04 ENCOUNTER — Other Ambulatory Visit: Payer: Self-pay

## 2020-06-04 ENCOUNTER — Encounter: Payer: Self-pay | Admitting: Family Medicine

## 2020-06-04 VITALS — BP 143/78 | HR 61 | Temp 97.6°F | Resp 20 | Ht 63.0 in | Wt 210.2 lb

## 2020-06-04 DIAGNOSIS — E039 Hypothyroidism, unspecified: Secondary | ICD-10-CM | POA: Diagnosis not present

## 2020-06-04 DIAGNOSIS — E785 Hyperlipidemia, unspecified: Secondary | ICD-10-CM | POA: Diagnosis not present

## 2020-06-04 DIAGNOSIS — I10 Essential (primary) hypertension: Secondary | ICD-10-CM | POA: Diagnosis not present

## 2020-06-04 DIAGNOSIS — E1169 Type 2 diabetes mellitus with other specified complication: Secondary | ICD-10-CM | POA: Diagnosis not present

## 2020-06-04 DIAGNOSIS — R001 Bradycardia, unspecified: Secondary | ICD-10-CM | POA: Diagnosis not present

## 2020-06-04 DIAGNOSIS — N3281 Overactive bladder: Secondary | ICD-10-CM

## 2020-06-04 DIAGNOSIS — Z23 Encounter for immunization: Secondary | ICD-10-CM | POA: Diagnosis not present

## 2020-06-04 DIAGNOSIS — E119 Type 2 diabetes mellitus without complications: Secondary | ICD-10-CM

## 2020-06-04 LAB — BAYER DCA HB A1C WAIVED: HB A1C (BAYER DCA - WAIVED): 5.4 % (ref <7.0)

## 2020-06-04 LAB — CBC WITH DIFFERENTIAL/PLATELET

## 2020-06-04 MED ORDER — MIRABEGRON ER 50 MG PO TB24: 50.0000 mg | ORAL_TABLET | Freq: Every day | ORAL | 2 refills | Status: DC

## 2020-06-04 MED ORDER — METFORMIN HCL 500 MG PO TABS: 500.0000 mg | ORAL_TABLET | Freq: Two times a day (BID) | ORAL | 0 refills | Status: DC

## 2020-06-04 MED ORDER — METFORMIN HCL 500 MG PO TABS: 500.0000 mg | ORAL_TABLET | Freq: Every day | ORAL | 1 refills | Status: DC

## 2020-06-04 MED ORDER — SIMVASTATIN 40 MG PO TABS: 40.0000 mg | ORAL_TABLET | Freq: Every evening | ORAL | 0 refills | Status: DC

## 2020-06-04 MED ORDER — AMLODIPINE BESYLATE 10 MG PO TABS: 10.0000 mg | ORAL_TABLET | Freq: Every day | ORAL | 1 refills | Status: DC

## 2020-06-04 NOTE — Progress Notes (Signed)
Subjective:  Patient ID: [name] [name], female    DOB: 07/29/1940  Age: 79 y.o. MRN: [id number]  CC: Medical Management of Chronic Issues   HPI [name] [name] presents forFollow-up of diabetes. Patient not checking blood sugar at home.  Patient denies symptoms such as polyuria, polydipsia, excessive hunger, nausea No significant hypoglycemic spells noted. Medications reviewed. Pt reports taking them regularly without complication/adverse reaction being reported today.  Checking feet daily.   BP running high and pulse low at home. Dr. Percival Spanish went up to 7.5 on amodipine in August. Still BP is elevated.Also pulse runs from high 30s to high 40s at home.  Denies symptoms, including chest pain, syncope, shortness of breath.  Using devices provided by [facility] program to monitor pulse and pressure. Dr. Percival Spanish had her wear a monitor for three days in August.  That did not show a significant dysrhythmia based on chart review of his notes.   History [name] [name] has a past medical history of Anemia, Arthritis, Diabetes mellitus without complication ([place]), DVT (deep venous thrombosis) ([place]), Dyspnea, GERD (gastroesophageal reflux disease), Headache(784.0), Hypertension, Hypothyroidism, and Pulmonary embolism ([place]).   She has a past surgical history that includes Abdominal hysterectomy; Parathyroidectomy; Multiple tooth extractions; Total hip arthroplasty (Right, 10/29/2013); Cataract extraction, bilateral; Esophagogastroduodenoscopy (N/A, 12/08/2019); Colonoscopy (N/A, 12/08/2019); polypectomy (12/08/2019); biopsy (12/08/2019); and Givens capsule study (N/A, 01/13/2020).   Her family history includes Cancer (age of onset: 23) in her sister; Cancer (age of onset: 61) in her mother; Colitis (age of onset: 97) in her sister; Healthy in her daughter, son, son, and son; Heart attack (age of onset: 50) in her brother; Heart disease in her brother; Heart disease (age of onset: 6) in her father; Pulmonary embolism  (age of onset: 10) in her brother.She reports that she has never smoked. She has never used smokeless tobacco. She reports that she does not drink alcohol and does not use drugs.  Current Outpatient Medications on File Prior to Visit  Medication Sig Dispense Refill  . acetaminophen (TYLENOL) 325 MG tablet Take 2 tablets (650 mg total) by mouth every 6 (six) hours as needed for mild pain or headache (or Fever >/= 101). 12 tablet 0  . allopurinol (ZYLOPRIM) 100 MG tablet TAKE 1 TABLET BY MOUTH  DAILY 90 tablet 0  . apixaban (ELIQUIS) 2.5 MG TABS tablet Take 1 tablet (2.5 mg total) by mouth 2 (two) times daily. 180 tablet 3  . Cholecalciferol (VITAMIN D-3) 5000 UNITS TABS Take 5,000 tablets by mouth daily.    . diclofenac sodium (VOLTAREN) 1 % GEL Apply 4 g topically 4 (four) times daily. 200 g 11  . furosemide (LASIX) 20 MG tablet TAKE 1 TABLET BY MOUTH  TWICE DAILY 180 tablet 0  . loratadine (CLARITIN) 10 MG tablet Take 10 mg by mouth daily.    [name] 100 MG tablet TAKE 1 TABLET BY MOUTH IN  THE MORNING 90 tablet 0  . ONE TOUCH ULTRA TEST test strip USE UP TO FOUR TIMES DAILY AS DIRECTED 400 each 3  . pantoprazole (PROTONIX) 40 MG tablet Take 40 mg by mouth daily.    . Colchicine 0.6 MG CAPS Take 1.2 mg (2 capsules) by mouth x1, then 0.6 mg (1 capsule) 1 hour later x1 as needed for gout flare. (Patient not taking: Reported on 06/04/2020) 9 capsule 2   No current facility-administered medications on file prior to visit.    ROS Review of Systems  Constitutional: Negative.   HENT: Negative.  Eyes: Negative for visual disturbance.  Respiratory: Negative for shortness of breath.   Cardiovascular: Negative for chest pain.  Gastrointestinal: Negative for abdominal pain.  Musculoskeletal: Negative for arthralgias.    Objective:  BP (!) 143/78   Pulse 61   Temp 97.6 F (36.4 C) (Temporal)   Resp 20   Ht _0  (1.6 m)   Wt 210 lb 4 oz (95.4 kg)   SpO2 99%   BMI 37.24 kg/m     BP Readings from Last 3 Encounters:  06/04/20 (!) 143/78  05/05/20 (!) 145/83  04/16/20 (!) 193/97    Wt Readings from Last 3 Encounters:  06/04/20 210 lb 4 oz (95.4 kg)  05/05/20 207 lb 12.8 oz (94.3 kg)  04/16/20 208 lb (94.3 kg)     Physical Exam Constitutional:      General: She is not in acute distress.    Appearance: She is well-developed.  HENT:     Head: Normocephalic and atraumatic.  Eyes:     Conjunctiva/sclera: Conjunctivae normal.     Pupils: Pupils are equal, round, and reactive to light.  Neck:     Thyroid: No thyromegaly.  Cardiovascular:     Rate and Rhythm: Normal rate and regular rhythm.     Heart sounds: Normal heart sounds. No murmur heard.   Pulmonary:     Effort: Pulmonary effort is normal. No respiratory distress.     Breath sounds: Normal breath sounds. No wheezing or rales.  Abdominal:     General: Bowel sounds are normal. There is no distension.     Palpations: Abdomen is soft.     Tenderness: There is no abdominal tenderness.  Musculoskeletal:        General: Normal range of motion.     Cervical back: Normal range of motion and neck supple.  Lymphadenopathy:     Cervical: No cervical adenopathy.  Skin:    General: Skin is warm and dry.  Neurological:     Mental Status: She is alert and oriented to person, place, and time.  Psychiatric:        Behavior: Behavior normal.        Thought Content: Thought content normal.        Judgment: Judgment normal.    EKG today shows sinus bradycardia. No acute ischemic chnage   Assessment & Plan:   [name] [name] was seen today for medical management of chronic issues.  Diagnoses and all orders for this visit:  Essential hypertension -     CBC with Differential/Platelet -     CMP14+EGFR -     Lipid panel -     EKG 12-Lead  Hypothyroidism, unspecified type -     CBC with Differential/Platelet -     CMP14+EGFR -     Lipid panel -     Thyroid Panel With TSH -     EKG 12-Lead  Type 2 diabetes  mellitus without complication, without long-term current use of insulin (HCC) -     Bayer DCA Hb A1c Waived -     CBC with Differential/Platelet -     CMP14+EGFR -     Lipid panel -     Discontinue: metFORMIN (GLUCOPHAGE) 500 MG tablet; Take 1 tablet (500 mg total) by mouth 2 (two) times daily with a meal. Give w/food. -     EKG 12-Lead -     metFORMIN (GLUCOPHAGE) 500 MG tablet; Take 1 tablet (500 mg total) by mouth daily with breakfast. Give w/food.  Hyperlipidemia associated with type 2 diabetes mellitus (HCC) -     CBC with Differential/Platelet -     CMP14+EGFR -     Lipid panel -     simvastatin (ZOCOR) 40 MG tablet; Take 1 tablet (40 mg total) by mouth every evening. -     EKG 12-Lead  Need for immunization against influenza -     Flu Vaccine QUAD High Dose(Fluad)  Bradycardia -     EKG 12-Lead -     EKG 12-Lead  Overactive bladder -     mirabegron ER (MYRBETRIQ) 50 MG TB24 tablet; Take 1 tablet (50 mg total) by mouth daily.  Other orders -     amLODipine (NORVASC) 10 MG tablet; Take 1 tablet (10 mg total) by mouth daily.      I have discontinued [name] [name]'s oxybutynin, metFORMIN, and amLODipine. I have also changed her simvastatin, amLODipine, and metFORMIN. Additionally, I am having her start on mirabegron ER. Lastly, I am having her maintain her Vitamin D-3, loratadine, ONE TOUCH ULTRA TEST, acetaminophen, diclofenac sodium, Colchicine, apixaban, losartan, furosemide, allopurinol, and pantoprazole.  Meds ordered this encounter  Medications  . simvastatin (ZOCOR) 40 MG tablet    Sig: Take 1 tablet (40 mg total) by mouth every evening.    Dispense:  90 tablet    Refill:  0  . DISCONTD: metFORMIN (GLUCOPHAGE) 500 MG tablet    Sig: Take 1 tablet (500 mg total) by mouth 2 (two) times daily with a meal. Give w/food.    Dispense:  180 tablet    Refill:  0  . amLODipine (NORVASC) 10 MG tablet    Sig: Take 1 tablet (10 mg total) by mouth daily.    Dispense:  90  tablet    Refill:  1  . metFORMIN (GLUCOPHAGE) 500 MG tablet    Sig: Take 1 tablet (500 mg total) by mouth daily with breakfast. Give w/food.    Dispense:  90 tablet    Refill:  1  . mirabegron ER (MYRBETRIQ) 50 MG TB24 tablet    Sig: Take 1 tablet (50 mg total) by mouth daily.    Dispense:  30 tablet    Refill:  2     Follow-up: Return in about 3 months (around 09/04/2020).  Claretta Fraise, M.D.

## 2020-06-05 LAB — CMP14+EGFR
ALT: 15 IU/L (ref 0–32)
AST: 15 IU/L (ref 0–40)
Albumin/Globulin Ratio: 2.4 — ABNORMAL HIGH (ref 1.2–2.2)
Albumin: 4.6 g/dL (ref 3.7–4.7)
Alkaline Phosphatase: 84 IU/L (ref 44–121)
BUN/Creatinine Ratio: 21 (ref 12–28)
BUN: 18 mg/dL (ref 8–27)
Bilirubin Total: 0.4 mg/dL (ref 0.0–1.2)
CO2: 24 mmol/L (ref 20–29)
Calcium: 11.3 mg/dL — ABNORMAL HIGH (ref 8.7–10.3)
Chloride: 106 mmol/L (ref 96–106)
Creatinine, Ser: 0.87 mg/dL (ref 0.57–1.00)
GFR calc Af Amer: 74 mL/min/{1.73_m2} (ref >59)
GFR calc non Af Amer: 64 mL/min/{1.73_m2} (ref >59)
Globulin, Total: 1.9 g/dL (ref 1.5–4.5)
Glucose: 105 mg/dL — ABNORMAL HIGH (ref 65–99)
Potassium: 4.4 mmol/L (ref 3.5–5.2)
Sodium: 142 mmol/L (ref 134–144)
Total Protein: 6.5 g/dL (ref 6.0–8.5)

## 2020-06-05 LAB — THYROID PANEL WITH TSH
Free Thyroxine Index: 2.1 (ref 1.2–4.9)
T3 Uptake Ratio: 25 % (ref 24–39)
T4, Total: 8.5 ug/dL (ref 4.5–12.0)
TSH: 4.69 u[IU]/mL — ABNORMAL HIGH (ref 0.450–4.500)

## 2020-06-05 LAB — CBC WITH DIFFERENTIAL/PLATELET
Basophils Absolute: 0 10*3/uL (ref 0.0–0.2)
Basos: 1 %
EOS (ABSOLUTE): 0.2 10*3/uL (ref 0.0–0.4)
Eos: 4 %
Hematocrit: 39.9 % (ref 34.0–46.6)
Hemoglobin: 12.6 g/dL (ref 11.1–15.9)
Immature Grans (Abs): 0 10*3/uL (ref 0.0–0.1)
Immature Granulocytes: 0 %
Lymphocytes Absolute: 2.6 10*3/uL (ref 0.7–3.1)
Lymphs: 40 %
MCH: 26.8 pg (ref 26.6–33.0)
MCHC: 31.6 g/dL (ref 31.5–35.7)
MCV: 85 fL (ref 79–97)
Monocytes Absolute: 0.9 10*3/uL (ref 0.1–0.9)
Monocytes: 13 %
Neutrophils Absolute: 2.9 10*3/uL (ref 1.4–7.0)
Neutrophils: 42 %
Platelets: 219 10*3/uL (ref 150–450)
RBC: 4.71 x10E6/uL (ref 3.77–5.28)
RDW: 22.1 % — ABNORMAL HIGH (ref 11.7–15.4)
WBC: 6.6 10*3/uL (ref 3.4–10.8)

## 2020-06-05 LAB — LIPID PANEL
Chol/HDL Ratio: 3.1 ratio (ref 0.0–4.4)
Cholesterol, Total: 173 mg/dL (ref 100–199)
HDL: 56 mg/dL (ref >39)
LDL Chol Calc (NIH): 87 mg/dL (ref 0–99)
Triglycerides: 174 mg/dL — ABNORMAL HIGH (ref 0–149)
VLDL Cholesterol Cal: 30 mg/dL (ref 5–40)

## 2020-06-06 ENCOUNTER — Other Ambulatory Visit: Payer: Self-pay | Admitting: Family Medicine

## 2020-06-06 DIAGNOSIS — E039 Hypothyroidism, unspecified: Secondary | ICD-10-CM

## 2020-06-06 MED ORDER — LEVOTHYROXINE SODIUM 50 MCG PO TABS: 50.0000 ug | ORAL_TABLET | Freq: Every day | ORAL | 2 refills | Status: DC

## 2020-06-07 ENCOUNTER — Encounter: Payer: Self-pay | Admitting: Family Medicine

## 2020-06-11 DIAGNOSIS — R001 Bradycardia, unspecified: Secondary | ICD-10-CM | POA: Insufficient documentation

## 2020-06-11 NOTE — Progress Notes (Signed)
Cardiology Office Note   Date:  06/12/2020   ID:  Drew, Herman 1940-10-14, MRN [id number]  PCP:  Loman Brooklyn, FNP  Cardiologist:   No primary care provider on file.   Chief Complaint  Patient presents with  . Palpitations      History of Present Illness: [name] [name] is a 79 y.o. female who presents for who presents for evaluation of chest pain.   This was likely non anginal.  She had bradycardia and had a monitor.  There were no significant bradyarrhythmias on this monitor.     Her heart rate is slower on EKG today.  However, she is not having any presyncope or syncope with this.  She did not have any sustained bradycardia arrhythmias on the monitor.  She does have some palpitations that are probably consistent with a supraventricular tachycardia short runs that she had.  However, these are not particularly problematic.  Her biggest issue has been her blood pressure.  I increased her Norvasc at the last visit and her doctor just increased her Norvasc again to 10 mg.  Blood pressure still running a little high with systolics in the 476L to 465K.  She does have chest discomfort.  However, this has been fleeting.  Is been lasting for about a year.  It is a dull discomfort that is 2 out of 10.  It comes at rest and only lasts for a couple of seconds.  She has not had any associated symptoms.  She cannot bring this on.  She is very limited by knee pain.  She walks with a cane.  She has morbid obesity.    Past Medical History:  Diagnosis Date  . Anemia   . Arthritis    Ostearthritis- hips, knees, fingers  . Diabetes mellitus without complication ([place])   . DVT (deep venous thrombosis) ([place])   . Dyspnea   . GERD (gastroesophageal reflux disease)   . Headache(784.0)    tx. Valproic acid  . Hypertension   . Hypothyroidism   . Pulmonary embolism [facility])     Past Surgical History:  Procedure Laterality Date  . ABDOMINAL HYSTERECTOMY    . BIOPSY  12/08/2019    Procedure: BIOPSY;  Surgeon: Daneil Dolin, MD;  Location: [facility] ENDO SU[facility];  Service: Endoscopy;;  . CATARACT EXTRACTION, BILATERAL    . COLONOSCOPY N/A 12/08/2019   polyps (tubular adenoma), diverticulosis, colonic lipoma, no surveillance due to age  . 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  [name] N/A 01/13/2020   Procedure: GIVENS CAPSULE STUDY;  Surgeon: Daneil Dolin, MD;  Location: [facility] ENDO SUITE;  Service: Endoscopy;  Laterality: N/A;  7:30am  . MULTIPLE TOOTH EXTRACTIONS     60's  . PARATHYROIDECTOMY    . POLYPECTOMY  12/08/2019   Procedure: POLYPECTOMY;  Surgeon: Daneil Dolin, MD;  Location: [facility] ENDO SUITE;  Service: Endoscopy;;  . TOTAL HIP ARTHROPLASTY Right 10/29/2013   Procedure: RIGHT TOTAL HIP ARTHROPLASTY ANTERIOR APPROACH;  Surgeon: Mauri Pole, MD;  Location: [facility] ORS;  Service: Orthopedics;  Laterality: Right;     Current Outpatient Medications  Medication Sig Dispense Refill  . acetaminophen (TYLENOL) 325 MG tablet Take 2 tablets (650 mg total) by mouth every 6 (six) hours as needed for mild pain or headache (or Fever >/= 101). 12 tablet 0  . allopurinol (ZYLOPRIM) 100 MG tablet TAKE 1 TABLET BY MOUTH  DAILY 90 tablet 0  . amLODipine (NORVASC) 10 MG tablet Take 1 tablet (10 mg total) by mouth daily. 90 tablet 1  . apixaban (ELIQUIS) 2.5 MG TABS tablet Take 1 tablet (2.5 mg total) by mouth 2 (two) times daily. 180 tablet 3  . Cholecalciferol (VITAMIN D-3) 5000 UNITS TABS Take 5,000 tablets by mouth daily.    . Colchicine 0.6 MG CAPS Take 1.2 mg (2 capsules) by mouth x1, then 0.6 mg (1 capsule) 1 hour later x1 as needed for gout flare. 9 capsule 2  . diclofenac sodium (VOLTAREN) 1 % GEL Apply 4 g topically 4 (four) times daily. 200 g 11  . furosemide (LASIX) 20 MG tablet TAKE 1 TABLET BY MOUTH  TWICE DAILY 180 tablet 0  . levothyroxine (SYNTHROID) 50 MCG  tablet Take 1 tablet (50 mcg total) by mouth daily before breakfast. 30 tablet 2  . loratadine (CLARITIN) 10 MG tablet Take 10 mg by mouth daily.    [name] 100 MG tablet TAKE 1 TABLET BY MOUTH IN  THE MORNING 90 tablet 0  . metFORMIN (GLUCOPHAGE) 500 MG tablet Take 1 tablet (500 mg total) by mouth daily with breakfast. Give w/food. 90 tablet 1  . mirabegron ER (MYRBETRIQ) 50 MG TB24 tablet Take 1 tablet (50 mg total) by mouth daily. 30 tablet 2  . ONE TOUCH ULTRA TEST test strip USE UP TO FOUR TIMES DAILY AS DIRECTED 400 each 3  . pantoprazole (PROTONIX) 40 MG tablet Take 40 mg by mouth daily.    . simvastatin (ZOCOR) 40 MG tablet Take 1 tablet (40 mg total) by mouth every evening. 90 tablet 0   No current facility-administered medications for this visit.    Allergies:   Patient has no known allergies.     ROS:  Please see the history of present illness.   Otherwise, review of systems are positive for none.   All other systems are reviewed and negative.    PHYSICAL EXAM: VS:  BP (!) 152/76   Pulse (!) 39   Ht 5\' 3"  (1.6 m)   Wt 209 lb 6.4 oz (95 kg)   BMI 37.09 kg/m  , BMI Body mass index is 37.09 kg/m.  GENERAL:  Well appearing NECK:  No jugular venous distention, waveform within normal limits, carotid upstroke brisk and symmetric, no bruits, no thyromegaly LUNGS:  Clear to auscultation bilaterally CHEST:  Unremarkable HEART:  PMI not displaced or sustained,S1 and S2 within normal limits, no S3, no S4, no clicks, no rubs, no murmurs ABD:  Flat, positive bowel sounds normal in frequency in pitch, no bruits, no rebound, no guarding, no midline pulsatile mass, no hepatomegaly, no splenomegaly EXT:  2 plus pulses throughout, no edema, no cyanosis no clubbing     EKG:  EKG is ordered today. The ekg ordered today demonstrates sinus rhythm, sinus bradycardia with probable A-V dissociation and junctional escape.   Recent Labs: 06/04/2020: ALT 15; BUN 18; Creatinine,  Ser 0.87; Hemoglobin 12.6; Platelets 219; Potassium 4.4; Sodium 142; TSH 4.690    Lipid Panel    Component Value Date/Time   CHOL 173 06/04/2020 0841   TRIG 174 (H) 06/04/2020 0841   HDL 56 06/04/2020 0841   CHOLHDL 3.1 06/04/2020 0841   LDLCALC 87 06/04/2020 0841      Wt Readings from Last 3 Encounters:  06/12/20 209 lb 6.4 oz (95 kg)  06/04/20 210 lb 4 oz (95.4 kg)  05/05/20 207 lb 12.8 oz (94.3 kg)  Other studies Reviewed: Additional studies/ records that were reviewed today include: Monitor. Review of the above records demonstrates:  Please see elsewhere in the note.     ASSESSMENT AND PLAN:  CHEST PAIN:     The chest pain again I explored in depth and it is very atypical.  I do not think that further cardiovascular testing is suggested.  PE:   I reduced her Eliquis down to 2.5 twice daily at the last visit.  I did review her labs today.  She has had previous anemia with a hemoglobin down to 8 in the past.  However, on follow-up last month it was up to 12.4.  She has not had no active evidence of bleeding.  No change in therapy.  I think she should continue this indefinitely for an unprovoked PE.  HTN:   The blood pressure is elevated but she just had her Norvasc increased.  She is going to send me a blood pressure diary and I will likely need to add another medication.   BRADYCARDIA:  We had a long discussion about this.  She has no symptomatic bradycardia arrhythmias.  She will let me know if she ever has presyncope or syncope or dizziness which might need to be treated differently.   Current medicines are reviewed at length with the patient today.  The patient does not have concerns regarding medicines.  The following changes have been made:  no change  Labs/ tests ordered today include: None  Orders Placed This Encounter  Procedures  . EKG 12-Lead     Disposition:   FU with me in six months.     Signed, Minus Breeding, MD  06/12/2020 2:31 PM    [facility]

## 2020-06-12 ENCOUNTER — Encounter: Payer: Self-pay | Admitting: Cardiology

## 2020-06-12 ENCOUNTER — Ambulatory Visit: Payer: Medicare Other | Admitting: Cardiology

## 2020-06-12 ENCOUNTER — Other Ambulatory Visit: Payer: Self-pay

## 2020-06-12 ENCOUNTER — Other Ambulatory Visit: Payer: Self-pay | Admitting: *Deleted

## 2020-06-12 VITALS — BP 152/76 | HR 39 | Ht 63.0 in | Wt 209.4 lb

## 2020-06-12 DIAGNOSIS — R079 Chest pain, unspecified: Secondary | ICD-10-CM

## 2020-06-12 DIAGNOSIS — I2699 Other pulmonary embolism without acute cor pulmonale: Secondary | ICD-10-CM | POA: Diagnosis not present

## 2020-06-12 DIAGNOSIS — I10 Essential (primary) hypertension: Secondary | ICD-10-CM | POA: Diagnosis not present

## 2020-06-12 DIAGNOSIS — E039 Hypothyroidism, unspecified: Secondary | ICD-10-CM

## 2020-06-12 DIAGNOSIS — R001 Bradycardia, unspecified: Secondary | ICD-10-CM | POA: Diagnosis not present

## 2020-06-12 NOTE — Patient Instructions (Addendum)
Medication Instructions:  No changes *If you need a refill on your cardiac medications before your next appointment, please call your pharmacy*  Lab Work: None ordered this visit  Testing/Procedures: None ordered this visit  Follow-Up: At [facility], you and your health needs are our priority.  As part of our continuing mission to provide you with exceptional heart care, we have created designated Provider Care Teams.  These Care Teams include your primary Cardiologist (physician) and Advanced Practice Providers (APPs -  Physician Assistants and Nurse Practitioners) who all work together to provide you with the care you need, when you need it.  Your next appointment:   6 month(s)  You will receive a reminder letter in the mail two months in advance. If you don't receive a letter, please call our office to schedule the follow-up appointment.  The format for your next appointment:   In Person  Provider:   Minus Breeding, MD

## 2020-06-24 MED ORDER — VALSARTAN 80 MG PO TABS: 80.0000 mg | ORAL_TABLET | Freq: Two times a day (BID) | ORAL | 3 refills | Status: DC

## 2020-07-01 ENCOUNTER — Encounter: Payer: Self-pay | Admitting: *Deleted

## 2020-07-01 ENCOUNTER — Ambulatory Visit: Payer: Medicare Other | Admitting: *Deleted

## 2020-07-01 DIAGNOSIS — D500 Iron deficiency anemia secondary to blood loss (chronic): Secondary | ICD-10-CM

## 2020-07-01 DIAGNOSIS — M810 Age-related osteoporosis without current pathological fracture: Secondary | ICD-10-CM

## 2020-07-01 DIAGNOSIS — M159 Polyosteoarthritis, unspecified: Secondary | ICD-10-CM

## 2020-07-01 DIAGNOSIS — M8949 Other hypertrophic osteoarthropathy, multiple sites: Secondary | ICD-10-CM

## 2020-07-01 NOTE — Patient Instructions (Addendum)
Visit Information  Goals Addressed              This Visit's Progress     Patient Stated   .  "I need to find out why I'm anemic" (pt-stated)         CARE PLAN ENTRY (see longitudinal plan of care for additional care plan information)  Current Barriers:  . Chronic Disease Management support and education needs related to anemia . On Eliquis for Afib . Neg colonoscopy and endosocpy  Nurse Case Manager Clinical Goal(s):  Marland Kitchen Over the next 180 days, patient will work with gastroenterologist to address needs related to anemia . Over the next 30 days, patient will reach out to [place] as needed to assist with care coordination  Interventions:  . Inter-disciplinary care team collaboration (see longitudinal plan of care) . Chart reviewed including recent office notes, imaging reports, and lab results o Hgb is normal at 12.4 . Discussed need for further imaging with a CTE as recommended by GI o Explained need for CT and difference between CT angiogram that she's had and the CT abd/pelvis that is ordered o Patient is still hesitant to schedule because she doesn't completley understand why she needs it o Considering scheduling after the first of the year due to medical bills that she's accumulated this year . Reviewed and discussed medications: Eliquis and iron . Provided with RNCM contact information and encouraged to reach out as needed . Encouraged patient to reach out to GI or PCP with any new or worsening symptoms . Encouraged patient to talk with GI about CT scan . Reviewed appt with GI, Roseanne Kaufman on 11/03/20  Patient Self Care Activities:  . Performs ADL's independently . Performs IADL's independently  Please see past updates related to this goal by clicking on the "Past Updates" button in the selected goal      .  "I want my shoulder to keep getting better" (pt-stated)        [place] (see longitudinal plan of care for additional care plan  information)  Current Barriers:  . Care Coordination needs related to left shoulder pain and decreased ROM s/p anterior dislocation in a patient with osteoporosis and arthritis  Nurse Case Manager Clinical Goal(s):  Marland Kitchen Over the next 90 days, patient will contact Dr Luna Glasgow with any new or worsening symptoms . Over the next 90 days, patient will continue to see improvement in left shoulder pain and ROM  Interventions:  . Inter-disciplinary care team collaboration (see longitudinal plan of care) . Chart reviewed including recent office notes, PT notes, and imaging reports o anterior shoulder dislocation in June 2020 o shoulder reduction and Physical Therapy . Discussed pain and decreased ROM o Pain in still improving. Only has pain with certain movements or when she lifts something.  o Passive ROM is greater than active ROM . Encouraged patient to continue the exercises PT gave her at least a few days a week . Encouraged patient to reach out to ortho, Dr Luna Glasgow, with any new or worsening symptoms . Previously discussed patient's anxiety/concern regarding the possibility of it dislocating again since there was no known cause for it . Encouraged patient to reach out to RN CM as needed  Patient Self Care Activities:  . Performs ADL's independently . Performs IADL's independently  Initial goal documentation     .  "I would like to treat my over active bladder" (pt-stated)        Current Barriers:  .  Knowledge Deficits related to overactive bladder treatment . Care Coordination needs related to overactive bladder in a patient with HTN, HLD, DM, hypothyroidism, OA, osteoporosis, anemia . Cost of myrbetriq . [employer] doughnut hole  Nurse Case Manager Clinical Goal(s):  Marland Kitchen Over the next 30 days, patient will talk with RN Care manager about cost of Myrbetriq and possible Rx assistance . Over the next 30 days, patient will talk with PCP about management of OAB symptoms  Interventions:   . Inter-disciplinary care team collaboration (see longitudinal plan of care) . Chart reviewed including recent office notes and lab results . Discussed overactive bladder with patient o Has urge incontinence. Will feel the need to urinate and then unable to hold bladder once she stands up to go to the bathroom. This is a problem during the day and night.Marland Kitchen  o Has noticed a slight improvement in OAB with myrbetriq but not in urge incontinence, which is the biggest concern for her. . Discussed cost of Myrbetriq o Cost is prohibitive. She did purchase one months supply for $108 o It has not helped enough to continue paying for and she would like to restart oxybutinin o Discussed Rx assistance application for 2458. RN will talk with patient in Jan about application . Discussed potential surgical treatments for urge incotinnence and patient will discuss potential urology referral with Dr Livia Snellen on 07/21/20 . RN Care Manager will collaborate with Dr Livia Snellen regarding restarting oxybutynin after Edyth Gunnels has ran out on 07/07/20 . Patient will reach out to PCP with any new or worsening symptoms . Patient will reach out to [place] as needed  Patient Self Care Activities:  . Patient will self administer medications as prescribed . Patient will attend all scheduled provider appointments . Patient will call pharmacy for medication refills . Patient will continue to perform ADL's independently . Patient will continue to perform IADL's independently . Patient will call provider office for new concerns or questions   Initial goal documentation     .  Osteoporosis Management        CARE PLAN ENTRY (see longtitudinal plan of care for additional care plan information)  Current Barriers:  . Chronic Disease Management support and education needs related to osteoporosis  Nurse Case Manager Clinical Goal(s):  Marland Kitchen Over the next 90 days, patient will have bone density scan performed . Over the next  90 days, patient will f/u with Clinical Pharmacist or PCP regarding osteoporosis management  Interventions:  . Inter-disciplinary care team collaboration (see longtitudinal plan of care) . Chart reviewed, including office notes, imaging reports, and lab results . Evaluation of current treatment plan related to osteoporosis and patient's adherence to plan as established by provider. . Reviewed medications o Calcium and Vit D o No longer taking fosamax  . Reviewed last dexa scan from 2019 . Recommended weight bearing exercise . Discussed that patient is scheduled for a dexa scan when she has her visit with Dr Livia Snellen on 07/21/20 . Encouraged patient to reach out to [facility] Manager as needed  Patient Self Care Activities:  . Performs ADL's independently . Performs IADL's independently . Taking calcium and Vit D  Please see past updates related to this goal by clicking on the "Past Updates" button in the selected goal         The patient verbalized understanding of instructions, educational materials, and care plan provided today and declined offer to receive copy of patient instructions, educational materials, and care plan.   Follow-up Plan  Telephone follow up appointment with care management team member scheduled for: 08/06/20 with RN Care Manager Next PCP appointment scheduled for: 07/21/20 with Dr Livia Snellen GI appt scheduled for 11/03/20  Chong Sicilian, BSN, RN-BC [place] [facility] [place] Management Direct Dial: [phone]

## 2020-07-01 NOTE — Chronic Care Management (AMB) (Signed)
Chronic Care Management   Follow Up Note   07/01/2020 Name: [name] [name] MRN: [id number] DOB: 07/20/1941  Referred by: Loman Brooklyn, FNP Reason for referral : Chronic Care Management (RN follow up)   [name] [name] is a 79 y.o. year old female who is a primary care patient of Loman Brooklyn, FNP. The CCM team was consulted for assistance with chronic disease management and care coordination needs.    Review of patient status, including review of consultants reports, relevant laboratory and other test results, and collaboration with appropriate care team members and the patient's provider was performed as part of comprehensive patient evaluation and provision of chronic care management services.    SDOH (Social Determinants of Health) assessments performed: Yes: Medication Cost See Care Plan activities for detailed interventions related to [facility])     Outpatient Encounter Medications as of 07/01/2020  Medication Sig  . acetaminophen (TYLENOL) 325 MG tablet Take 2 tablets (650 mg total) by mouth every 6 (six) hours as needed for mild pain or headache (or Fever >/= 101).  Marland Kitchen allopurinol (ZYLOPRIM) 100 MG tablet TAKE 1 TABLET BY MOUTH  DAILY  . amLODipine (NORVASC) 10 MG tablet Take 1 tablet (10 mg total) by mouth daily.  Marland Kitchen apixaban (ELIQUIS) 2.5 MG TABS tablet Take 1 tablet (2.5 mg total) by mouth 2 (two) times daily.  . Cholecalciferol (VITAMIN D-3) 5000 UNITS TABS Take 5,000 tablets by mouth daily.  . Colchicine 0.6 MG CAPS Take 1.2 mg (2 capsules) by mouth x1, then 0.6 mg (1 capsule) 1 hour later x1 as needed for gout flare.  . diclofenac sodium (VOLTAREN) 1 % GEL Apply 4 g topically 4 (four) times daily.  . furosemide (LASIX) 20 MG tablet TAKE 1 TABLET BY MOUTH  TWICE DAILY  . levothyroxine (SYNTHROID) 50 MCG tablet Take 1 tablet (50 mcg total) by mouth daily before breakfast.  . loratadine (CLARITIN) 10 MG tablet Take 10 mg by mouth daily.  . metFORMIN (GLUCOPHAGE)  500 MG tablet Take 1 tablet (500 mg total) by mouth daily with breakfast. Give w/food.  . mirabegron ER (MYRBETRIQ) 50 MG TB24 tablet Take 1 tablet (50 mg total) by mouth daily.  . ONE TOUCH ULTRA TEST test strip USE UP TO FOUR TIMES DAILY AS DIRECTED  . pantoprazole (PROTONIX) 40 MG tablet Take 40 mg by mouth daily.  . simvastatin (ZOCOR) 40 MG tablet Take 1 tablet (40 mg total) by mouth every evening.  . valsartan (DIOVAN) 80 MG tablet Take 1 tablet (80 mg total) by mouth 2 (two) times daily.   No facility-administered encounter medications on file as of 07/01/2020.      Goals Addressed              This Visit's Progress     Patient Stated   .  "I need to find out why I'm anemic" (pt-stated)         CARE PLAN ENTRY (see longitudinal plan of care for additional care plan information)  Current Barriers:  . Chronic Disease Management support and education needs related to anemia . On Eliquis for Afib . Neg colonoscopy and endosocpy  Nurse Case Manager Clinical Goal(s):  Marland Kitchen Over the next 180 days, patient will work with gastroenterologist to address needs related to anemia . Over the next 30 days, patient will reach out to [place] as needed to assist with care coordination  Interventions:  . Inter-disciplinary care team collaboration (see longitudinal plan of  care) . Chart reviewed including recent office notes, imaging reports, and lab results o Hgb is normal at 12.4 . Discussed need for further imaging with a CTE as recommended by GI o Explained need for CT and difference between CT angiogram that she's had and the CT abd/pelvis that is ordered o Patient is still hesitant to schedule because she doesn't completley understand why she needs it o Considering scheduling after the first of the year due to medical bills that she's accumulated this year . Reviewed and discussed medications: Eliquis and iron . Provided with RNCM contact information and encouraged to reach out  as needed . Encouraged patient to reach out to GI or PCP with any new or worsening symptoms . Encouraged patient to talk with GI about CT scan . Reviewed appt with GI, Roseanne Kaufman on 11/03/20  Patient Self Care Activities:  . Performs ADL's independently . Performs IADL's independently  Please see past updates related to this goal by clicking on the "Past Updates" button in the selected goal      .  "I want my shoulder to keep getting better" (pt-stated)        [place] (see longitudinal plan of care for additional care plan information)  Current Barriers:  . Care Coordination needs related to left shoulder pain and decreased ROM s/p anterior dislocation in a patient with osteoporosis and arthritis  Nurse Case Manager Clinical Goal(s):  Marland Kitchen Over the next 90 days, patient will contact Dr Luna Glasgow with any new or worsening symptoms . Over the next 90 days, patient will continue to see improvement in left shoulder pain and ROM  Interventions:  . Inter-disciplinary care team collaboration (see longitudinal plan of care) . Chart reviewed including recent office notes, PT notes, and imaging reports o anterior shoulder dislocation in June 2020 o shoulder reduction and Physical Therapy . Discussed pain and decreased ROM o Pain in still improving. Only has pain with certain movements or when she lifts something.  o Passive ROM is greater than active ROM . Encouraged patient to continue the exercises PT gave her at least a few days a week . Encouraged patient to reach out to ortho, Dr Luna Glasgow, with any new or worsening symptoms . Previously discussed patient's anxiety/concern regarding the possibility of it dislocating again since there was no known cause for it . Encouraged patient to reach out to RN CM as needed  Patient Self Care Activities:  . Performs ADL's independently . Performs IADL's independently  Initial goal documentation     .  "I would like to treat my over active  bladder" (pt-stated)        Current Barriers:  Marland Kitchen Knowledge Deficits related to overactive bladder treatment . Care Coordination needs related to overactive bladder in a patient with HTN, HLD, DM, hypothyroidism, OA, osteoporosis, anemia . Cost of myrbetriq . [employer] doughnut hole  Nurse Case Manager Clinical Goal(s):  Marland Kitchen Over the next 30 days, patient will talk with RN Care manager about cost of Myrbetriq and possible Rx assistance . Over the next 30 days, patient will talk with PCP about management of OAB symptoms  Interventions:  . Inter-disciplinary care team collaboration (see longitudinal plan of care) . Chart reviewed including recent office notes and lab results . Discussed overactive bladder with patient o Has urge incontinence. Will feel the need to urinate and then unable to hold bladder once she stands up to go to the bathroom. This is a problem during the day and  night..  o Has noticed a slight improvement in OAB with myrbetriq but not in urge incontinence, which is the biggest concern for her. . Discussed cost of Myrbetriq o Cost is prohibitive. She did purchase one months supply for $108 o It has not helped enough to continue paying for and she would like to restart oxybutinin o Discussed Rx assistance application for 4163. RN will talk with patient in Jan about application . Discussed potential surgical treatments for urge incotinnence and patient will discuss potential urology referral with Dr Livia Snellen on 07/21/20 . RN Care Manager will collaborate with Dr Livia Snellen regarding restarting oxybutynin after Edyth Gunnels has ran out on 07/07/20 . Patient will reach out to PCP with any new or worsening symptoms . Patient will reach out to [place] as needed  Patient Self Care Activities:  . Patient will self administer medications as prescribed . Patient will attend all scheduled provider appointments . Patient will call pharmacy for medication refills . Patient will continue to  perform ADL's independently . Patient will continue to perform IADL's independently . Patient will call provider office for new concerns or questions   Initial goal documentation       Other   .  Osteoporosis Management        CARE PLAN ENTRY (see longtitudinal plan of care for additional care plan information)  Current Barriers:  . Chronic Disease Management support and education needs related to osteoporosis  Nurse Case Manager Clinical Goal(s):  Marland Kitchen Over the next 90 days, patient will have bone density scan performed . Over the next 90 days, patient will f/u with Clinical Pharmacist or PCP regarding osteoporosis management  Interventions:  . Inter-disciplinary care team collaboration (see longtitudinal plan of care) . Chart reviewed, including office notes, imaging reports, and lab results . Evaluation of current treatment plan related to osteoporosis and patient's adherence to plan as established by provider. . Reviewed medications o Calcium and Vit D o No longer taking fosamax  . Reviewed last dexa scan from 2019 . Recommended weight bearing exercise . Discussed that patient is scheduled for a dexa scan when she has her visit with Dr Livia Snellen on 07/21/20 . Encouraged patient to reach out to [facility] Manager as needed  Patient Self Care Activities:  . Performs ADL's independently . Performs IADL's independently . Taking calcium and Vit D  Please see past updates related to this goal by clicking on the "Past Updates" button in the selected goal           Follow-up Plan Telephone follow up appointment with care management team member scheduled for: 08/06/20 with RN Care Manager Next PCP appointment scheduled for: 07/21/20 with Dr Livia Snellen GI appt scheduled for 11/03/20  Chong Sicilian, BSN, RN-BC [place] [facility] [place] Management Direct Dial: [phone]

## 2020-07-06 ENCOUNTER — Other Ambulatory Visit: Payer: Self-pay

## 2020-07-06 DIAGNOSIS — D509 Iron deficiency anemia, unspecified: Secondary | ICD-10-CM

## 2020-07-21 ENCOUNTER — Encounter: Payer: Self-pay | Admitting: Family Medicine

## 2020-07-21 ENCOUNTER — Other Ambulatory Visit: Payer: Self-pay

## 2020-07-21 ENCOUNTER — Ambulatory Visit (INDEPENDENT_AMBULATORY_CARE_PROVIDER_SITE_OTHER): Payer: Medicare Other | Admitting: Family Medicine

## 2020-07-21 VITALS — BP 133/73 | HR 74 | Temp 97.9°F | Ht 63.0 in | Wt 210.4 lb

## 2020-07-21 DIAGNOSIS — E039 Hypothyroidism, unspecified: Secondary | ICD-10-CM | POA: Diagnosis not present

## 2020-07-21 DIAGNOSIS — N3281 Overactive bladder: Secondary | ICD-10-CM | POA: Diagnosis not present

## 2020-07-21 NOTE — Progress Notes (Signed)
Subjective:  Patient ID: [name] [name], female    DOB: 1941/04/18  Age: 79 y.o. MRN: [id number]  CC: Hypothyroidism   HPI [name] [name] presents for says the Myrbetriq did not help at all so she went back to the Detrol.  Symptoms seem to be better now.  She took some Azo for a few days and that seemed to get her back to her baseline.  She does not want to pursue the urinary issue with urology at this time.  She also is in stating that her energy is better she is not as cold nor is she is constipated as she was before the increase in thyroid medicine several weeks ago.  She feels that the new medicine is helping.  She has not had a repeat of her blood work yet.  Depression screen [facility] 2/9 06/04/2020 01/30/2020 12/11/2019  Decreased Interest 0 0 0  Down, Depressed, Hopeless 0 0 0  PHQ - 2 Score 0 0 0    History [place] has a past medical history of Anemia, Arthritis, Diabetes mellitus without complication ([place]), DVT (deep venous thrombosis) ([place]), Dyspnea, GERD (gastroesophageal reflux disease), Headache(784.0), Hypertension, Hypothyroidism, and Pulmonary embolism ([place]).   She has a past surgical history that includes Abdominal hysterectomy; Parathyroidectomy; Multiple tooth extractions; Total hip arthroplasty (Right, 10/29/2013); Cataract extraction, bilateral; Esophagogastroduodenoscopy (N/A, 12/08/2019); Colonoscopy (N/A, 12/08/2019); polypectomy (12/08/2019); biopsy (12/08/2019); and Givens capsule study (N/A, 01/13/2020).   Her family history includes Cancer (age of onset: 56) in her sister; Cancer (age of onset: 36) in her mother; Colitis (age of onset: 77) in her sister; Healthy in her daughter, son, son, and son; Heart attack (age of onset: 39) in her brother; Heart disease in her brother; Heart disease (age of onset: 13) in her father; Pulmonary embolism (age of onset: 50) in her brother.She reports that she has never smoked. She has never used smokeless tobacco. She reports that she  does not drink alcohol and does not use drugs.    ROS Review of Systems  Constitutional: Negative.   HENT: Negative.   Eyes: Negative for visual disturbance.  Respiratory: Negative for shortness of breath.   Cardiovascular: Negative for chest pain.  Gastrointestinal: Negative for abdominal pain.  Musculoskeletal: Negative for arthralgias.    Objective:  BP 133/73   Pulse 74   Temp 97.9 F (36.6 C) (Temporal)   Ht 5' 3" (1.6 m)   Wt 210 lb 6 oz (95.4 kg)   BMI 37.27 kg/m   BP Readings from Last 3 Encounters:  07/21/20 133/73  06/12/20 (!) 152/76  06/04/20 (!) 143/78    Wt Readings from Last 3 Encounters:  07/21/20 210 lb 6 oz (95.4 kg)  06/12/20 209 lb 6.4 oz (95 kg)  06/04/20 210 lb 4 oz (95.4 kg)     Physical Exam Constitutional:      General: She is not in acute distress.    Appearance: She is well-developed.  HENT:     Head: Normocephalic and atraumatic.  Eyes:     Conjunctiva/sclera: Conjunctivae normal.     Pupils: Pupils are equal, round, and reactive to light.  Neck:     Thyroid: No thyromegaly.  Cardiovascular:     Rate and Rhythm: Normal rate and regular rhythm.     Heart sounds: Normal heart sounds. No murmur heard.   Pulmonary:     Effort: Pulmonary effort is normal. No respiratory distress.     Breath sounds: Normal breath sounds. No wheezing or rales.  Abdominal:     General: Bowel sounds are normal. There is no distension.     Palpations: Abdomen is soft.     Tenderness: There is no abdominal tenderness.  Musculoskeletal:        General: Normal range of motion.     Cervical back: Normal range of motion and neck supple.  Lymphadenopathy:     Cervical: No cervical adenopathy.  Skin:    General: Skin is warm and dry.  Neurological:     Mental Status: She is alert and oriented to person, place, and time.  Psychiatric:        Behavior: Behavior normal.        Thought Content: Thought content normal.        Judgment: Judgment normal.        Assessment & Plan:   Courtenay was seen today for hypothyroidism.  Diagnoses and all orders for this visit:  Hypothyroidism, unspecified type -     T4, Free -     TSH -     CMP14+EGFR -     TSH + free T4  Overactive bladder   Continue on the Detrol for now.  Consider urology consult when and if patient decides that the urinary symptoms including stress incontinence become intolerable.  Based on her improvement in her symptoms I believe this thyroid test will be better than the previous one.  She tells me she is always had a low heart rate.  At this time when I counted it is 48.  She says it was 41 at home this morning.  Her cardiology provider told her as long as she was not passing out that he did not want to put a pacemaker in.  We will keep an eye on that.  Hopefully we can get it up above 50 with an increase in thyroid medicine should the results of the blood test be conducive to that change    I have discontinued Juley R. Sorbo's mirabegron ER. I am also having her maintain her Vitamin D-3, loratadine, ONE TOUCH ULTRA TEST, acetaminophen, diclofenac sodium, Colchicine, apixaban, furosemide, allopurinol, pantoprazole, simvastatin, amLODipine, metFORMIN, levothyroxine, valsartan, and oxybutynin.  Allergies as of 07/21/2020   No Known Allergies     Medication List       Accurate as of July 21, 2020  3:39 PM. If you have any questions, ask your nurse or doctor.        STOP taking these medications   mirabegron ER 50 MG Tb24 tablet Commonly known as: Myrbetriq Stopped by: Warren Stacks, MD     TAKE these medications   acetaminophen 325 MG tablet Commonly known as: TYLENOL Take 2 tablets (650 mg total) by mouth every 6 (six) hours as needed for mild pain or headache (or Fever >/= 101).   allopurinol 100 MG tablet Commonly known as: ZYLOPRIM TAKE 1 TABLET BY MOUTH  DAILY   amLODipine 10 MG tablet Commonly known as: NORVASC Take 1 tablet (10 mg total) by  mouth daily.   apixaban 2.5 MG Tabs tablet Commonly known as: ELIQUIS Take 1 tablet (2.5 mg total) by mouth 2 (two) times daily.   Colchicine 0.6 MG Caps Take 1.2 mg (2 capsules) by mouth x1, then 0.6 mg (1 capsule) 1 hour later x1 as needed for gout flare.   diclofenac sodium 1 % Gel Commonly known as: VOLTAREN Apply 4 g topically 4 (four) times daily.   furosemide 20 MG tablet Commonly known as: LASIX TAKE 1 TABLET BY   MOUTH  TWICE DAILY   levothyroxine 50 MCG tablet Commonly known as: SYNTHROID Take 1 tablet (50 mcg total) by mouth daily before breakfast.   loratadine 10 MG tablet Commonly known as: CLARITIN Take 10 mg by mouth daily.   metFORMIN 500 MG tablet Commonly known as: GLUCOPHAGE Take 1 tablet (500 mg total) by mouth daily with breakfast. Give w/food.   ONE TOUCH ULTRA TEST test strip Generic drug: glucose blood USE UP TO FOUR TIMES DAILY AS DIRECTED   oxybutynin 5 MG 24 hr tablet Commonly known as: DITROPAN-XL Take 5 mg by mouth at bedtime.   pantoprazole 40 MG tablet Commonly known as: PROTONIX Take 40 mg by mouth daily.   simvastatin 40 MG tablet Commonly known as: ZOCOR Take 1 tablet (40 mg total) by mouth every evening.   valsartan 80 MG tablet Commonly known as: Diovan Take 1 tablet (80 mg total) by mouth 2 (two) times daily.   Vitamin D-3 125 MCG (5000 UT) Tabs Take 5,000 tablets by mouth daily.        Follow-up: No follow-ups on file.  Claretta Fraise, M.D.

## 2020-07-22 LAB — CMP14+EGFR
ALT: 14 IU/L (ref 0–32)
AST: 13 IU/L (ref 0–40)
Albumin/Globulin Ratio: 1.7 (ref 1.2–2.2)
Albumin: 4.3 g/dL (ref 3.7–4.7)
Alkaline Phosphatase: 88 IU/L (ref 44–121)
BUN/Creatinine Ratio: 25 (ref 12–28)
BUN: 25 mg/dL (ref 8–27)
Bilirubin Total: 0.2 mg/dL (ref 0.0–1.2)
CO2: 24 mmol/L (ref 20–29)
Calcium: 11.4 mg/dL — ABNORMAL HIGH (ref 8.7–10.3)
Chloride: 106 mmol/L (ref 96–106)
Creatinine, Ser: 1 mg/dL (ref 0.57–1.00)
GFR calc Af Amer: 62 mL/min/{1.73_m2} (ref >59)
GFR calc non Af Amer: 54 mL/min/{1.73_m2} — ABNORMAL LOW (ref >59)
Globulin, Total: 2.5 g/dL (ref 1.5–4.5)
Glucose: 98 mg/dL (ref 65–99)
Potassium: 4.3 mmol/L (ref 3.5–5.2)
Sodium: 145 mmol/L — ABNORMAL HIGH (ref 134–144)
Total Protein: 6.8 g/dL (ref 6.0–8.5)

## 2020-07-22 LAB — TSH: TSH: 1.82 u[IU]/mL (ref 0.450–4.500)

## 2020-07-22 LAB — T4, FREE: Free T4: 1.42 ng/dL (ref 0.82–1.77)

## 2020-07-26 NOTE — Progress Notes (Signed)
Hello Tashae,  Your lab result is normal and/or stable.Some minor variations that are not significant are commonly marked abnormal, but do not represent any medical problem for you.  Best regards, Mechele Claude, M.D.

## 2020-08-05 ENCOUNTER — Other Ambulatory Visit: Payer: Self-pay | Admitting: Family Medicine

## 2020-08-05 DIAGNOSIS — E1169 Type 2 diabetes mellitus with other specified complication: Secondary | ICD-10-CM

## 2020-08-06 ENCOUNTER — Ambulatory Visit: Payer: Medicare Other | Admitting: *Deleted

## 2020-08-06 DIAGNOSIS — N3281 Overactive bladder: Secondary | ICD-10-CM

## 2020-08-06 DIAGNOSIS — M810 Age-related osteoporosis without current pathological fracture: Secondary | ICD-10-CM

## 2020-08-06 DIAGNOSIS — D500 Iron deficiency anemia secondary to blood loss (chronic): Secondary | ICD-10-CM

## 2020-08-06 NOTE — Chronic Care Management (AMB) (Signed)
Chronic Care Management   Follow Up Note   08/06/2020 Name: Khadeja Abt MRN: [id number] DOB: 1941-04-21  Referred by: Loman Brooklyn, FNP Reason for referral : Chronic Care Management (RN follow up)   [name] [name] is a 80 y.o. year old female who is a primary care patient of Loman Brooklyn, FNP. The CCM team was consulted for assistance with chronic disease management and care coordination needs.    Review of patient status, including review of consultants reports, relevant laboratory and other test results, and collaboration with appropriate care team members and the patient's provider was performed as part of comprehensive patient evaluation and provision of chronic care management services.    SDOH (Social Determinants of Health) assessments performed: No See Care Plan activities for detailed interventions related to [facility])     Outpatient Encounter Medications as of 08/06/2020  Medication Sig  . acetaminophen (TYLENOL) 325 MG tablet Take 2 tablets (650 mg total) by mouth every 6 (six) hours as needed for mild pain or headache (or Fever >/= 101).  Marland Kitchen allopurinol (ZYLOPRIM) 100 MG tablet TAKE 1 TABLET BY MOUTH  DAILY  . amLODipine (NORVASC) 10 MG tablet Take 1 tablet (10 mg total) by mouth daily.  Marland Kitchen apixaban (ELIQUIS) 2.5 MG TABS tablet Take 1 tablet (2.5 mg total) by mouth 2 (two) times daily.  . Cholecalciferol (VITAMIN D-3) 5000 UNITS TABS Take 5,000 tablets by mouth daily.  . Colchicine 0.6 MG CAPS Take 1.2 mg (2 capsules) by mouth x1, then 0.6 mg (1 capsule) 1 hour later x1 as needed for gout flare.  . diclofenac sodium (VOLTAREN) 1 % GEL Apply 4 g topically 4 (four) times daily.  . furosemide (LASIX) 20 MG tablet TAKE 1 TABLET BY MOUTH  TWICE DAILY  . levothyroxine (SYNTHROID) 50 MCG tablet Take 1 tablet (50 mcg total) by mouth daily before breakfast.  . loratadine (CLARITIN) 10 MG tablet Take 10 mg by mouth daily.  . metFORMIN (GLUCOPHAGE) 500 MG tablet Take 1  tablet (500 mg total) by mouth daily with breakfast. Give w/food.  . ONE TOUCH ULTRA TEST test strip USE UP TO FOUR TIMES DAILY AS DIRECTED  . oxybutynin (DITROPAN-XL) 5 MG 24 hr tablet Take 5 mg by mouth at bedtime.  . pantoprazole (PROTONIX) 40 MG tablet Take 40 mg by mouth daily.  . simvastatin (ZOCOR) 40 MG tablet TAKE 1 TABLET BY MOUTH IN  THE EVENING  . valsartan (DIOVAN) 80 MG tablet Take 1 tablet (80 mg total) by mouth 2 (two) times daily.   No facility-administered encounter medications on file as of 08/06/2020.     Goals Addressed              This Visit's Progress     Patient Stated   .  "I need to find out why I'm anemic" (pt-stated)   Not on track      [place] (see longitudinal plan of care for additional care plan information)  Current Barriers:  . Chronic Disease Management support and education needs related to anemia . On Eliquis for Afib . Neg colonoscopy and endosocpy  Nurse Case Manager Clinical Goal(s):  Marland Kitchen Over the next 180 days, patient will work with gastroenterologist to address needs related to anemia . Over the next 30 days, patient will reach out to [place] as needed to assist with care coordination  Interventions:  . Inter-disciplinary care team collaboration (see longitudinal plan of care) . Chart reviewed including recent  office notes, imaging reports, and lab results o Most recent Hgb was normal at 12.4 . Reviewed current lab orders from GI and discussed with patient that she can have those done through [place] [facility] around 08/22/20 . Again discussed need for further imaging with a CTE as recommended by GI o Patient is still considering this. Would like to wait due to medical costs from last year.  . Reviewed and discussed medications: Eliquis and iron . Provided with RNCM contact information and encouraged to reach out as needed . Encouraged patient to reach out to GI or PCP with any new or worsening symptoms . Encouraged  patient to talk with GI about CT scan . Reviewed appt with GI, Roseanne Kaufman on 11/03/20  Patient Self Care Activities:  . Performs ADL's independently . Performs IADL's independently      .  COMPLETED: "I want my shoulder to keep getting better" (pt-stated)         08/06/2020 Goal complete. Pain has improved and patient knows her limitations for ROM. Will f/u with ortho as needed     .  COMPLETED: "I would like to treat my over active bladder" (pt-stated)   On track     08/06/2020 Goal complete. Patient has switched back to detrol and has seen an improvement in symptoms and she feels better overall. Medication is affordable.      .  Osteoporosis Management   Not on track     Current Barriers:  . Chronic Disease Management support and education needs related to osteoporosis  Nurse Case Manager Clinical Goal(s):  Marland Kitchen Over the next 90 days, patient will have bone density scan performed . Over the next 90 days, patient will f/u with Clinical Pharmacist or PCP regarding osteoporosis management  Interventions:  . Inter-disciplinary care team collaboration (see longtitudinal plan of care) . Chart reviewed, including office notes, imaging reports, and lab results . Evaluation of current treatment plan related to osteoporosis and patient's adherence to plan as established by provider. . Reviewed medications o Calcium and Vit D o No longer taking fosamax  . Reviewed last dexa scan from 2019 . Recommended weight bearing exercise . Patient did not have dexa scan at visit on 07/21/20 . Added appointment note that patient needs dexa scan at next PCP visit in March 2022 . Encouraged patient to reach out to [facility] Manager as needed  Patient Self Care Activities:  . Performs ADL's independently . Performs IADL's independently . Taking calcium and Vit D           Plan:  Telephone follow up appointment with care management team member scheduled for: 11/05/20 The patient has been provided  with contact information for the care management team and has been advised to call with any health related questions or concerns.  Next PCP appointment scheduled for:  10/20/20 with [name], FNP Follow up with Gastroenterologist on 11/03/20   Chong Sicilian, BSN, RN-BC [place] [facility] [place] Management Direct Dial: [phone]

## 2020-08-06 NOTE — Patient Instructions (Signed)
Visit Information  Goals Addressed              This Visit's Progress     Patient Stated   .  "I need to find out why I'm anemic" (pt-stated)   Not on track      [place] (see longitudinal plan of care for additional care plan information)  Current Barriers:  . Chronic Disease Management support and education needs related to anemia . On Eliquis for Afib . Neg colonoscopy and endosocpy  Nurse Case Manager Clinical Goal(s):  [name] [name] Over the next 180 days, patient will work with gastroenterologist to address needs related to anemia . Over the next 30 days, patient will reach out to [place] as needed to assist with care coordination  Interventions:  . Inter-disciplinary care team collaboration (see longitudinal plan of care) . Chart reviewed including recent office notes, imaging reports, and lab results o Most recent Hgb was normal at 12.4 . Reviewed current lab orders from GI and discussed with patient that she can have those done through [place] at [facility] around 08/22/20 . Again discussed need for further imaging with a CTE as recommended by GI o Patient is still considering this. Would like to wait due to medical costs from last year.  . Reviewed and discussed medications: Eliquis and iron . Provided with RNCM contact information and encouraged to reach out as needed . Encouraged patient to reach out to GI or PCP with any new or worsening symptoms . Encouraged patient to talk with GI about CT scan . Reviewed appt with GI, [name] [name] on 11/03/20  Patient Self Care Activities:  . Performs ADL's independently . Performs IADL's independently      .  COMPLETED: "I want my shoulder to keep getting better" (pt-stated)        CARE PLAN ENTRY (see longitudinal plan of care for additional care plan information)  Current Barriers:  . Care Coordination needs related to left shoulder pain and decreased ROM s/p anterior dislocation in a patient with osteoporosis and  arthritis  Nurse Case Manager Clinical Goal(s):  [name] [name] Over the next 90 days, patient will contact [name] [name] with any new or worsening symptoms . Over the next 90 days, patient will continue to see improvement in left shoulder pain and ROM  Interventions:  . Inter-disciplinary care team collaboration (see longitudinal plan of care) . Chart reviewed including recent office notes, PT notes, and imaging reports o anterior shoulder dislocation in June 2020 o shoulder reduction and Physical Therapy . Discussed pain and decreased ROM o Pain in still improving. Only has pain with certain movements or when she lifts something.  o Passive ROM is greater than active ROM . Encouraged patient to continue the exercises PT gave her at least a few days a week . Encouraged patient to reach out to ortho, [name] [name], with any new or worsening symptoms . Previously discussed patient's anxiety/concern regarding the possibility of it dislocating again since there was no known cause for it . Encouraged patient to reach out to RN CM as needed  Patient Self Care Activities:  . Performs ADL's independently . Performs IADL's independently  Initial goal documentation  08/06/2020 Goal complete. Pain has improved and patient knows her limitations for ROM. Will f/u with ortho as needed     .  COMPLETED: "I would like to treat my over active bladder" (pt-stated)   On track     Current Barriers:  . Care Coordination needs  related to overactive bladder in a patient with HTN, HLD, DM, hypothyroidism, OA, osteoporosis, anemia  Nurse Case Manager Clinical Goal(s):  [name] [name] Over the next 30 days, patient will talk with RN Care manager about cost of Myrbetriq and possible Rx assistance . Over the next 30 days, patient will talk with PCP about management of OAB symptoms  Interventions:  . Inter-disciplinary care team collaboration (see longitudinal plan of care) . Chart reviewed including recent office notes and lab  results . Discussed overactive bladder with patient o Has urge incontinence. Will feel the need to urinate and then unable to hold bladder once she stands up to go to the bathroom. This is a problem during the day and night.[name] [name]  o Has noticed a slight improvement in OAB with myrbetriq but not in urge incontinence, which is the biggest concern for her. . Discussed cost of Myrbetriq o Cost is prohibitive. She did purchase one months supply for $108 o It has not helped enough to continue paying for and she would like to restart oxybutinin o Discussed Rx assistance application for 2831. RN will talk with patient in Jan about application . Discussed potential surgical treatments for urge incotinnence and patient will discuss potential urology referral with [name] [name] [name] on 07/21/20 . RN Care Manager will collaborate with [name] [name] [name] regarding restarting oxybutynin after [name] [name] has ran out on 07/07/20 . Patient will reach out to PCP with any new or worsening symptoms . Patient will reach out to [place] as needed  Patient Self Care Activities:  . Patient will self administer medications as prescribed . Patient will attend all scheduled provider appointments . Patient will call pharmacy for medication refills . Patient will continue to perform ADL's independently . Patient will continue to perform IADL's independently . Patient will call provider office for new concerns or questions   Initial goal documentation  08/06/2020 Goal complete. Patient has switched back to detrol and has seen an improvement in symptoms and she feels better overall. Medication is affordable.        Other   .  Osteoporosis Management   Not on track     Current Barriers:  . Chronic Disease Management support and education needs related to osteoporosis  Nurse Case Manager Clinical Goal(s):  [name] [name] Over the next 90 days, patient will have bone density scan performed . Over the next 90 days, patient will f/u with  Clinical Pharmacist or PCP regarding osteoporosis management  Interventions:  . Inter-disciplinary care team collaboration (see longtitudinal plan of care) . Chart reviewed, including office notes, imaging reports, and lab results . Evaluation of current treatment plan related to osteoporosis and patient's adherence to plan as established by provider. . Reviewed medications o Calcium and Vit D o No longer taking fosamax  . Reviewed last dexa scan from 2019 . Recommended weight bearing exercise . Patient did not have dexa scan at visit on 07/21/20 . Added appointment note that patient needs dexa scan at next PCP visit in March 2022 . Encouraged patient to reach out to [facility] Manager as needed  Patient Self Care Activities:  . Performs ADL's independently . Performs IADL's independently . Taking calcium and Vit D           The patient verbalized understanding of instructions, educational materials, and care plan provided today and declined offer to receive copy of patient instructions, educational materials, and care plan.    Plan:  Telephone follow up appointment with care management team member scheduled  for: 11/05/20 The patient has been provided with contact information for the care management team and has been advised to call with any health related questions or concerns.  Next PCP appointment scheduled for:  10/20/20 with [name] Limes, [name] [name] Follow up with Gastroenterologist on 11/03/20   [name] [name], BSN, RN-BC [place] [facility] [place] [facility] Direct Dial: [phone]

## 2020-08-20 ENCOUNTER — Other Ambulatory Visit: Payer: Self-pay | Admitting: Family Medicine

## 2020-08-20 DIAGNOSIS — M1A9XX0 Chronic gout, unspecified, without tophus (tophi): Secondary | ICD-10-CM

## 2020-08-24 ENCOUNTER — Telehealth: Payer: Self-pay

## 2020-08-24 NOTE — Telephone Encounter (Signed)
Attempted to contact patient- LMTCB   Patient last seen Dr. Livia Snellen on 12/28- below is part of the office note where medication was increased.  Please review and advise.   Based on her improvement in her symptoms I believe this thyroid test will be better than the previous one.  She tells me she is always had a low heart rate.  At this time when I counted it is 48.  She says it was 41 at home this morning.  Her cardiology provider told her as long as she was not passing out that he did not want to put a pacemaker in.  We will keep an eye on that.  Hopefully we can get it up above 50 with an increase in thyroid medicine should the results of the blood test be conducive to that change  PCP- OFF Last seen Dr. Livia Snellen- phone call sent to Dr. Livia Snellen

## 2020-08-24 NOTE — Telephone Encounter (Signed)
Patient aware and verbalizes understanding. 

## 2020-08-24 NOTE — Telephone Encounter (Signed)
First, increasing the thyroid medicine would increase the heart rate not cause it to decrease.  Second the increase in the medicine was made in November.  Her follow-up test done in December showed that her thyroid had improved.  It had been underactive and the increase just brought it back to normal range.  Again, if any abnormality with her current heart rate it would speed it up not slow it down.  Should she cut back on her thyroid at this point the rate would slow down even further which would be intolerable.  For that low of a heart rate she needs to see her cardiologist ASAP

## 2020-08-24 NOTE — Telephone Encounter (Signed)
Crystal (RN calling from [facility]) states:   Pts heart rate is ranging from 36-43. Says PCP increased her medicines and believes pt may be on too much medication. Pt denies being light headed or dizzy.   Can contact [place] at [phone] ext [phone]   Please call patient with any questions or updates/changes to medications.

## 2020-09-14 ENCOUNTER — Other Ambulatory Visit: Payer: Self-pay | Admitting: Family Medicine

## 2020-09-14 DIAGNOSIS — E039 Hypothyroidism, unspecified: Secondary | ICD-10-CM

## 2020-10-20 ENCOUNTER — Ambulatory Visit (INDEPENDENT_AMBULATORY_CARE_PROVIDER_SITE_OTHER): Payer: Medicare Other | Admitting: Family Medicine

## 2020-10-20 ENCOUNTER — Encounter: Payer: Self-pay | Admitting: Family Medicine

## 2020-10-20 ENCOUNTER — Other Ambulatory Visit: Payer: Self-pay

## 2020-10-20 ENCOUNTER — Telehealth: Payer: Self-pay | Admitting: Internal Medicine

## 2020-10-20 VITALS — BP 134/72 | HR 65 | Temp 98.5°F | Ht 63.0 in | Wt 212.0 lb

## 2020-10-20 DIAGNOSIS — D509 Iron deficiency anemia, unspecified: Secondary | ICD-10-CM | POA: Diagnosis not present

## 2020-10-20 DIAGNOSIS — E039 Hypothyroidism, unspecified: Secondary | ICD-10-CM

## 2020-10-20 DIAGNOSIS — E119 Type 2 diabetes mellitus without complications: Secondary | ICD-10-CM

## 2020-10-20 DIAGNOSIS — D500 Iron deficiency anemia secondary to blood loss (chronic): Secondary | ICD-10-CM | POA: Diagnosis not present

## 2020-10-20 DIAGNOSIS — E1169 Type 2 diabetes mellitus with other specified complication: Secondary | ICD-10-CM

## 2020-10-20 DIAGNOSIS — E785 Hyperlipidemia, unspecified: Secondary | ICD-10-CM | POA: Diagnosis not present

## 2020-10-20 DIAGNOSIS — Z23 Encounter for immunization: Secondary | ICD-10-CM

## 2020-10-20 DIAGNOSIS — I10 Essential (primary) hypertension: Secondary | ICD-10-CM

## 2020-10-20 LAB — BAYER DCA HB A1C WAIVED: HB A1C (BAYER DCA - WAIVED): 5.7 % (ref <7.0)

## 2020-10-20 NOTE — Progress Notes (Signed)
Assessment & Plan:  1. Type 2 diabetes mellitus without complication, without long-term current use of insulin (HCC) Lab Results  Component Value Date   HGBA1C 5.7 10/20/2020   HGBA1C 5.4 06/04/2020   HGBA1C 6.1 (H) 12/06/2019    - Diabetes is at goal of A1c < 7. - Medications: D/C metformin - Home glucose monitoring: continue checking blood glucose fasting - patient to let me know if >150 - Patient is currently taking a statin. Patient is taking an ACE-inhibitor/ARB.  - Instruction/counseling given: reminded to get eye exam  Diabetes Health Maintenance Due  Topic Date Due  . OPHTHALMOLOGY EXAM  Never done  . FOOT EXAM  12/02/2020  . HEMOGLOBIN A1C  04/22/2021    Lab Results  Component Value Date   LABMICR 101.5 12/03/2019   LABMICR 25.4 10/17/2016   - Lipid panel - CBC with Differential/Platelet - CMP14+EGFR - Bayer DCA Hb A1c Waived  2. Essential hypertension Well controlled on current regimen.  - Lipid panel - CBC with Differential/Platelet - CMP14+EGFR  3. Hyperlipidemia associated with type 2 diabetes mellitus ([place]) Well controlled on current regimen.  - Lipid panel - CBC with Differential/Platelet - CMP14+EGFR  4. Hypothyroidism, unspecified type Well controlled on current regimen.  - TSH  5. Iron deficiency anemia due to chronic blood loss - CBC with Differential/Platelet  6. Immunization due - Varicella-zoster vaccine IM (Shingrix) - given in office   No follow-ups on file.  [name], MSN, APRN, FNP-C [facility]  Subjective:    Patient ID: [name] [name], female    DOB: August 19, 1940, 80 y.o.   MRN: [id number]  Patient Care Team: Loman Brooklyn, FNP as PCP - General (Family Medicine) Paralee Cancel, MD as Consulting Physician (Orthopedic Surgery) Ilean China, RN as Case Manager   Chief Complaint:  Chief Complaint  Patient presents with  . Diabetes  . Hypothyroidism    3 month follow up of chronic  medical conditions     HPI: [name] [name] is a 80 y.o. female presenting on 10/20/2020 for Diabetes and Hypothyroidism (3 month follow up of chronic medical conditions )  Diabetes: Patient presents for follow up of diabetes. Current symptoms include: none. Known diabetic complications: none. Medication compliance: yes - her metformin dose was decreased from 500 mg BID to QD at her last visit. Current diet: in general, a "healthy" diet  . Current exercise: none. Home blood sugar records: BGs range between 99 and 105. Is she  on ACE inhibitor or angiotensin II receptor blocker? Yes. Is she on a statin? Yes.   Hypertension: Patient is checking her blood pressure at home and reports her systolic is generally 833-825K.  She does eat a low-salt diet. Unable to exercise due to her balance and knees.  New complaints: None  Social history:  Relevant past medical, surgical, family and social history reviewed and updated as indicated. Interim medical history since our last visit reviewed.  Allergies and medications reviewed and updated.  DATA REVIEWED: CHART IN EPIC  ROS: Negative unless specifically indicated above in HPI.    Current Outpatient Medications:  .  acetaminophen (TYLENOL) 325 MG tablet, Take 2 tablets (650 mg total) by mouth every 6 (six) hours as needed for mild pain or headache (or Fever >/= 101)., Disp: 12 tablet, Rfl: 0 .  allopurinol (ZYLOPRIM) 100 MG tablet, TAKE 1 TABLET BY MOUTH  DAILY, Disp: 90 tablet, Rfl: 0 .  amLODipine (NORVASC) 10 MG tablet, Take 1 tablet (  10 mg total) by mouth daily., Disp: 90 tablet, Rfl: 1 .  apixaban (ELIQUIS) 2.5 MG TABS tablet, Take 1 tablet (2.5 mg total) by mouth 2 (two) times daily., Disp: 180 tablet, Rfl: 3 .  Cholecalciferol (VITAMIN D-3) 5000 UNITS TABS, Take 5,000 tablets by mouth daily., Disp: , Rfl:  .  Colchicine 0.6 MG CAPS, Take 1.2 mg (2 capsules) by mouth x1, then 0.6 mg (1 capsule) 1 hour later x1 as needed for gout flare.,  Disp: 9 capsule, Rfl: 2 .  diclofenac sodium (VOLTAREN) 1 % GEL, Apply 4 g topically 4 (four) times daily., Disp: 200 g, Rfl: 11 .  EUTHYROX 50 MCG tablet, TAKE 1 TABLET BY MOUTH ONCE DAILY BEFORE BREAKFAST, Disp: 30 tablet, Rfl: 10 .  furosemide (LASIX) 20 MG tablet, TAKE 1 TABLET BY MOUTH  TWICE DAILY, Disp: 180 tablet, Rfl: 0 .  loratadine (CLARITIN) 10 MG tablet, Take 10 mg by mouth daily., Disp: , Rfl:  .  metFORMIN (GLUCOPHAGE) 500 MG tablet, Take 1 tablet (500 mg total) by mouth daily with breakfast. Give w/food., Disp: 90 tablet, Rfl: 1 .  ONE TOUCH ULTRA TEST test strip, USE UP TO FOUR TIMES DAILY AS DIRECTED, Disp: 400 each, Rfl: 3 .  oxybutynin (DITROPAN-XL) 5 MG 24 hr tablet, Take 5 mg by mouth at bedtime., Disp: , Rfl:  .  pantoprazole (PROTONIX) 40 MG tablet, Take 40 mg by mouth daily., Disp: , Rfl:  .  simvastatin (ZOCOR) 40 MG tablet, TAKE 1 TABLET BY MOUTH IN  THE EVENING, Disp: 90 tablet, Rfl: 0 .  valsartan (DIOVAN) 80 MG tablet, Take 1 tablet (80 mg total) by mouth 2 (two) times daily., Disp: 180 tablet, Rfl: 3   No Known Allergies Past Medical History:  Diagnosis Date  . Anemia   . Arthritis    Ostearthritis- hips, knees, fingers  . Diabetes mellitus without complication ([place])   . DVT (deep venous thrombosis) ([place])   . Dyspnea   . GERD (gastroesophageal reflux disease)   . Headache(784.0)    tx. Valproic acid  . Hypertension   . Hypothyroidism   . Pulmonary embolism [facility])     Past Surgical History:  Procedure Laterality Date  . ABDOMINAL HYSTERECTOMY    . BIOPSY  12/08/2019   Procedure: BIOPSY;  Surgeon: Daneil Dolin, MD;  [facility];  Service: Endoscopy;;  . CATARACT EXTRACTION, BILATERAL    . COLONOSCOPY N/A 12/08/2019   polyps (tubular adenoma), diverticulosis, colonic lipoma, no surveillance due to age  . 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   [name] N/A 01/13/2020   Procedure: GIVENS CAPSULE STUDY;  Surgeon: Daneil Dolin, MD;  [facility];  Service: Endoscopy;  Laterality: N/A;  7:30am  . MULTIPLE TOOTH EXTRACTIONS     60's  . PARATHYROIDECTOMY    . POLYPECTOMY  12/08/2019   Procedure: POLYPECTOMY;  Surgeon: Daneil Dolin, MD;  [facility];  Service: Endoscopy;;  . TOTAL HIP ARTHROPLASTY Right 10/29/2013   Procedure: RIGHT TOTAL HIP ARTHROPLASTY ANTERIOR APPROACH;  Surgeon: Mauri Pole, MD;  Location[facility];  Service: Orthopedics;  Laterality: Right;    Social History   Socioeconomic History  . Marital status: Widowed    Spouse name: Not on file  . Number of children: 4  . Years of education: 82  . Highest education level: High school graduate  Occupational History  .  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 Smoking status: Never Smoker  . Smokeless tobacco: Never Used  Vaping Use  . Vaping Use: Never used  Substance and Sexual Activity  . Alcohol use: No    Alcohol/week: 0.0 standard drinks  . Drug use: No  . Sexual activity: Not Currently  Other Topics Concern  . Not on file  Social History Narrative   Patient is widowed and lives in a one story home. She has four adult children and one son lives with her.    Social Determinants of Health   Financial Resource Strain: Low Risk   . Difficulty of Paying Living Expenses: Not hard at all  Food Insecurity: No Food Insecurity  . Worried About Charity fundraiser in the Last Year: Never true  . Ran Out of Food in the Last Year: Never true  Transportation Needs: No Transportation Needs  . Lack of Transportation (Medical): No  . Lack of Transportation (Non-Medical): No  Physical Activity: Sufficiently Active  . Days of Exercise per Week: 7 days  . Minutes of Exercise per Session: 30 min  Stress: No Stress Concern Present  . Feeling of Stress : Not at all  Social Connections: Socially  Integrated  . Frequency of Communication with Friends and Family: More than three times a week  . Frequency of Social Gatherings with Friends and Family: More than three times a week  . Attends Religious Services: More than 4 times per year  . Active Member of Clubs or Organizations: Yes  . Attends Archivist Meetings: More than 4 times per year  . Marital Status: Married  Human resources officer Violence: Not At Risk  . Fear of Current or Ex-Partner: No  . Emotionally Abused: No  . Physically Abused: No  . Sexually Abused: No        Objective:    BP 134/72 Comment: at home this AM  Pulse 65   Temp 98.5 F (36.9 C) (Temporal)   Ht _0  (1.6 m)   Wt 212 lb (96.2 kg)   SpO2 97%   BMI 37.55 kg/m   Wt Readings from Last 3 Encounters:  10/20/20 212 lb (96.2 kg)  07/21/20 210 lb 6 oz (95.4 kg)  06/12/20 209 lb 6.4 oz (95 kg)    Physical Exam Vitals reviewed.  Constitutional:      General: She is not in acute distress.    Appearance: Normal appearance. She is morbidly obese. She is not ill-appearing, toxic-appearing or diaphoretic.  HENT:     Head: Normocephalic and atraumatic.  Eyes:     General: No scleral icterus.       Right eye: No discharge.        Left eye: No discharge.     Conjunctiva/sclera: Conjunctivae normal.  Cardiovascular:     Rate and Rhythm: Normal rate and regular rhythm.     Heart sounds: Normal heart sounds. No murmur heard. No friction rub. No gallop.   Pulmonary:     Effort: Pulmonary effort is normal. No respiratory distress.     Breath sounds: Normal breath sounds. No stridor. No wheezing, rhonchi or rales.  Musculoskeletal:        General: Normal range of motion.     Cervical back: Normal range of motion.  Skin:    General: Skin is warm and dry.     Capillary Refill: Capillary refill takes less than 2 seconds.  Neurological:  General: No focal deficit present.     Mental Status: She is alert and oriented to person, place, and time.  Mental status is at baseline.     Gait: Gait abnormal (ambulates with cane).  Psychiatric:        Mood and Affect: Mood normal.        Behavior: Behavior normal.        Thought Content: Thought content normal.        Judgment: Judgment normal.     Lab Results  Component Value Date   TSH 1.820 07/21/2020   Lab Results  Component Value Date   WBC 6.6 06/04/2020   HGB 12.6 06/04/2020   HCT 39.9 06/04/2020   MCV 85 06/04/2020   PLT 219 06/04/2020   Lab Results  Component Value Date   NA 145 (H) 07/21/2020   K 4.3 07/21/2020   CO2 24 07/21/2020   GLUCOSE 98 07/21/2020   BUN 25 07/21/2020   CREATININE 1.00 07/21/2020   BILITOT 0.2 07/21/2020   ALKPHOS 88 07/21/2020   AST 13 07/21/2020   ALT 14 07/21/2020   PROT 6.8 07/21/2020   ALBUMIN 4.3 07/21/2020   CALCIUM 11.4 (H) 07/21/2020   ANIONGAP 7 12/07/2019   Lab Results  Component Value Date   CHOL 173 06/04/2020   Lab Results  Component Value Date   HDL 56 06/04/2020   Lab Results  Component Value Date   LDLCALC 87 06/04/2020   Lab Results  Component Value Date   TRIG 174 (H) 06/04/2020   Lab Results  Component Value Date   CHOLHDL 3.1 06/04/2020   Lab Results  Component Value Date   HGBA1C 5.7 10/20/2020

## 2020-10-20 NOTE — Telephone Encounter (Signed)
Patient going to [facility] this morning to see her pcp, she is to have labs for this office and she does not have the order.  Can one be faxed to [facility] before her appointment today there at 62

## 2020-10-20 NOTE — Telephone Encounter (Signed)
Noted. Lab orders were faxed to [place].

## 2020-10-21 ENCOUNTER — Other Ambulatory Visit: Payer: Self-pay | Admitting: *Deleted

## 2020-10-21 LAB — CBC WITH DIFFERENTIAL/PLATELET
Basophils Absolute: 0 10*3/uL (ref 0.0–0.2)
Basos: 1 %
EOS (ABSOLUTE): 0.1 10*3/uL (ref 0.0–0.4)
Eos: 2 %
Hematocrit: 41.3 % (ref 34.0–46.6)
Hemoglobin: 13.7 g/dL (ref 11.1–15.9)
Immature Grans (Abs): 0 10*3/uL (ref 0.0–0.1)
Immature Granulocytes: 0 %
Lymphocytes Absolute: 2 10*3/uL (ref 0.7–3.1)
Lymphs: 31 %
MCH: 31 pg (ref 26.6–33.0)
MCHC: 33.2 g/dL (ref 31.5–35.7)
MCV: 93 fL (ref 79–97)
Monocytes Absolute: 0.7 10*3/uL (ref 0.1–0.9)
Monocytes: 11 %
Neutrophils Absolute: 3.5 10*3/uL (ref 1.4–7.0)
Neutrophils: 55 %
Platelets: 211 10*3/uL (ref 150–450)
RBC: 4.42 x10E6/uL (ref 3.77–5.28)
RDW: 13.1 % (ref 11.7–15.4)
WBC: 6.3 10*3/uL (ref 3.4–10.8)

## 2020-10-21 LAB — IRON,TIBC AND FERRITIN PANEL
Ferritin: 54 ng/mL (ref 15–150)
Iron Saturation: 28 % (ref 15–55)
Iron: 98 ug/dL (ref 27–139)
Total Iron Binding Capacity: 350 ug/dL (ref 250–450)
UIBC: 252 ug/dL (ref 118–369)

## 2020-10-21 LAB — CMP14+EGFR
ALT: 14 IU/L (ref 0–32)
AST: 18 IU/L (ref 0–40)
Albumin/Globulin Ratio: 2 (ref 1.2–2.2)
Albumin: 4.7 g/dL (ref 3.7–4.7)
Alkaline Phosphatase: 92 IU/L (ref 44–121)
BUN/Creatinine Ratio: 20 (ref 12–28)
BUN: 24 mg/dL (ref 8–27)
Bilirubin Total: 0.4 mg/dL (ref 0.0–1.2)
CO2: 23 mmol/L (ref 20–29)
Calcium: 11.9 mg/dL — ABNORMAL HIGH (ref 8.7–10.3)
Chloride: 104 mmol/L (ref 96–106)
Creatinine, Ser: 1.18 mg/dL — ABNORMAL HIGH (ref 0.57–1.00)
Globulin, Total: 2.4 g/dL (ref 1.5–4.5)
Glucose: 116 mg/dL — ABNORMAL HIGH (ref 65–99)
Potassium: 4.7 mmol/L (ref 3.5–5.2)
Sodium: 143 mmol/L (ref 134–144)
Total Protein: 7.1 g/dL (ref 6.0–8.5)
eGFR: 47 mL/min/{1.73_m2} — ABNORMAL LOW (ref >59)

## 2020-10-21 LAB — LIPID PANEL
Chol/HDL Ratio: 3.2 ratio (ref 0.0–4.4)
Cholesterol, Total: 174 mg/dL (ref 100–199)
HDL: 55 mg/dL (ref >39)
LDL Chol Calc (NIH): 90 mg/dL (ref 0–99)
Triglycerides: 167 mg/dL — ABNORMAL HIGH (ref 0–149)
VLDL Cholesterol Cal: 29 mg/dL (ref 5–40)

## 2020-10-21 LAB — TSH: TSH: 2.85 u[IU]/mL (ref 0.450–4.500)

## 2020-10-21 MED ORDER — OXYBUTYNIN CHLORIDE ER 5 MG PO TB24: 5.0000 mg | ORAL_TABLET | Freq: Every day | ORAL | 1 refills | Status: DC

## 2020-10-22 ENCOUNTER — Other Ambulatory Visit: Payer: Self-pay | Admitting: Family Medicine

## 2020-10-22 DIAGNOSIS — M1A9XX0 Chronic gout, unspecified, without tophus (tophi): Secondary | ICD-10-CM

## 2020-11-03 ENCOUNTER — Other Ambulatory Visit: Payer: Self-pay

## 2020-11-03 ENCOUNTER — Ambulatory Visit: Payer: Medicare Other | Admitting: Gastroenterology

## 2020-11-03 ENCOUNTER — Encounter: Payer: Self-pay | Admitting: Gastroenterology

## 2020-11-03 VITALS — BP 127/72 | HR 64 | Temp 97.1°F | Ht 63.0 in | Wt 211.8 lb

## 2020-11-03 DIAGNOSIS — D500 Iron deficiency anemia secondary to blood loss (chronic): Secondary | ICD-10-CM | POA: Diagnosis not present

## 2020-11-03 NOTE — Progress Notes (Signed)
Referring Provider: Loman Brooklyn, FNP Primary Care Physician:  Loman Brooklyn, FNP Primary GI: Dr. Gala Romney   Chief Complaint  Patient presents with  . Anemia    Doing ok    HPI:   [name] [name] is a 80 y.o. female presenting today with a history of IDA, heme positive stool in setting of Eliquis for history of PE/DVT, hospitalized in May 2021 undergoingcolonoscopy with polyps(tubular adenoma), diverticulosis, colonic lipoma,andEGD withnormal esophagus with 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 Received 2 units blood while inpatient.   US abdomen with fatty liver. Capsule study with scattered small bowel erosions, abnormal small bowel also noted with edema, inflammation/erosion. CTA with SMA noting 50% stenosis, IMA and celiac patent. CTE was recommended to complete small bowel work-up. She injured her shoulder and has had to postpone this.  Returns today for routine follow-up. Continues on iron BID. No abdominal pain, N/V, diarrhea, constipation. On protonix once daily. Stools chronically dark on iron. Hgb has continued to improve, most recently 13.7. Ferritin 54. Iron 98.   She would like to hold off on CTE now until review of next labs. She is willing to pursue if recurrent IDA>   Past Medical History:  Diagnosis Date  . Anemia   . Arthritis    Ostearthritis- hips, knees, fingers  . Diabetes mellitus without complication ([place])   . DVT (deep venous thrombosis) ([place])   . Dyspnea   . GERD (gastroesophageal reflux disease)   . Headache(784.0)    tx. Valproic acid  . Hypertension   . Hypothyroidism   . Pulmonary embolism [facility])     Past Surgical History:  Procedure Laterality Date  . ABDOMINAL HYSTERECTOMY    . BIOPSY  12/08/2019   Procedure: BIOPSY;  Surgeon: Daneil Dolin, MD;  [facility];  Service: Endoscopy;;  . CATARACT EXTRACTION, BILATERAL    . COLONOSCOPY N/A 12/08/2019   polyps (tubular adenoma),  diverticulosis, colonic lipoma, no surveillance due to age  . 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  [name] N/A 01/13/2020   Procedure: GIVENS CAPSULE STUDY;  Surgeon: Daneil Dolin, MD;  [facility];  Service: Endoscopy;  Laterality: N/A;  7:30am  . MULTIPLE TOOTH EXTRACTIONS     60's  . PARATHYROIDECTOMY    . POLYPECTOMY  12/08/2019   Procedure: POLYPECTOMY;  Surgeon: Daneil Dolin, MD;  [facility];  Service: Endoscopy;;  . TOTAL HIP ARTHROPLASTY Right 10/29/2013   Procedure: RIGHT TOTAL HIP ARTHROPLASTY ANTERIOR APPROACH;  Surgeon: Mauri Pole, MD;  [facility];  Service: Orthopedics;  Laterality: Right;    Current Outpatient Medications  Medication Sig Dispense Refill  . acetaminophen (TYLENOL) 325 MG tablet Take 2 tablets (650 mg total) by mouth every 6 (six) hours as needed for mild pain or headache (or Fever >/= 101). 12 tablet 0  . allopurinol (ZYLOPRIM) 100 MG tablet TAKE 1 TABLET BY MOUTH  DAILY 90 tablet 0  . amLODipine (NORVASC) 10 MG tablet Take 1 tablet (10 mg total) by mouth daily. 90 tablet 1  . apixaban (ELIQUIS) 2.5 MG TABS tablet Take 1 tablet (2.5 mg total) by mouth 2 (two) times daily. 180 tablet 3  . Cholecalciferol (VITAMIN D-3) 5000 UNITS TABS Take 5,000 Units by mouth daily.    . diclofenac sodium (VOLTAREN) 1 % GEL Apply 4 g topically 4 (four) times  daily. (Patient taking differently: Apply 4 g topically as needed.) 200 g 11  . EUTHYROX 50 MCG tablet TAKE 1 TABLET BY MOUTH ONCE DAILY BEFORE BREAKFAST 30 tablet 10  . ferrous sulfate 325 (65 FE) MG tablet Take 325 mg by mouth 2 (two) times daily.    . furosemide (LASIX) 20 MG tablet TAKE 1 TABLET BY MOUTH  TWICE DAILY 180 tablet 0  . loratadine (CLARITIN) 10 MG tablet Take 10 mg by mouth daily.    . ONE TOUCH ULTRA TEST test strip USE UP TO FOUR TIMES DAILY AS DIRECTED  400 each 3  . oxybutynin (DITROPAN-XL) 5 MG 24 hr tablet Take 1 tablet (5 mg total) by mouth at bedtime. (Patient taking differently: Take 5 mg by mouth 3 (three) times daily.) 90 tablet 1  . pantoprazole (PROTONIX) 40 MG tablet Take 40 mg by mouth daily.    . simvastatin (ZOCOR) 40 MG tablet TAKE 1 TABLET BY MOUTH IN  THE EVENING 90 tablet 0  . valsartan (DIOVAN) 80 MG tablet Take 1 tablet (80 mg total) by mouth 2 (two) times daily. 180 tablet 3  . Colchicine 0.6 MG CAPS Take 1.2 mg (2 capsules) by mouth x1, then 0.6 mg (1 capsule) 1 hour later x1 as needed for gout flare. (Patient not taking: Reported on 11/03/2020) 9 capsule 2   No current facility-administered medications for this visit.    Allergies as of 11/03/2020  . (No Known Allergies)    Family History  Problem Relation Age of Onset  . Colitis Sister 3       alive  . Cancer Sister 19       colon  . Cancer Mother 8       uterine  . Heart disease Father 29       heart failure  . Heart attack Brother 52  . Healthy Daughter   . Healthy Son   . Pulmonary embolism Brother 67  . Heart disease Brother   . Healthy Son   . Healthy Son     Social History   Socioeconomic History  . Marital status: Widowed    Spouse name: Not on file  . Number of children: 4  . Years of education: 43  . Highest education level: High school graduate  Occupational History  . 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 Smoking status: Never Smoker  . Smokeless tobacco: Never Used  Vaping Use  . Vaping Use: Never used  Substance and Sexual Activity  . Alcohol use: No    Alcohol/week: 0.0 standard drinks  . Drug use: No  . Sexual activity: Not Currently  Other Topics Concern  . Not on file  Social History Narrative   Patient is widowed and lives in a one story home. She has four adult children and one son lives with her.    Social Determinants of Health   Financial Resource Strain: Low Risk   . Difficulty of  Paying Living Expenses: Not hard at all  Food Insecurity: No Food Insecurity  . Worried About Charity fundraiser in the Last Year: Never true  . Ran Out of Food in the Last Year: Never true  Transportation Needs: No Transportation Needs  . Lack of Transportation (Medical): No  . Lack of Transportation (Non-Medical): No  Physical Activity: Sufficiently Active  . Days of Exercise per Week: 7 days  . Minutes of Exercise per Session: 30 min  Stress: No Stress  Concern Present  . Feeling of Stress : Not at all  Social Connections: Socially Integrated  . Frequency of Communication with Friends and Family: More than three times a week  . Frequency of Social Gatherings with Friends and Family: More than three times a week  . Attends Religious Services: More than 4 times per year  . Active Member of Clubs or Organizations: Yes  . Attends Archivist Meetings: More than 4 times per year  . Marital Status: Married    Review of Systems: Gen: Denies fever, chills, anorexia. Denies fatigue, weakness, weight loss.  CV: Denies chest pain, palpitations, syncope, peripheral edema, and claudication. Resp: Denies dyspnea at rest, cough, wheezing, coughing up blood, and pleurisy. GI: see HPI Derm: Denies rash, itching, dry skin Psych: Denies depression, anxiety, memory loss, confusion. No homicidal or suicidal ideation.  Heme: Denies bruising, bleeding, and enlarged lymph nodes.  Physical Exam: BP 127/72   Pulse 64   Temp (!) 97.1 F (36.2 C) (Temporal)   Ht 5\' 3"  (1.6 m)   Wt 211 lb 12.8 oz (96.1 kg)   BMI 37.52 kg/m  General:   Alert and oriented. No distress noted. Pleasant and cooperative.  Head:  Normocephalic and atraumatic. Eyes:  Conjuctiva clear without scleral icterus. Mouth:  Mask in place Abdomen:  +BS, soft, non-tender and non-distended. Sitting in chair, unable to get on exam table.  Msk:  Symmetrical without gross deformities. Normal posture. Extremities:  Without  edema. Neurologic:  Alert and  oriented x4 Psych:  Alert and cooperative. Normal mood and affect.  ASSESSMENT: [name] [name] is a 80 y.o. female presenting today with history of IDA, s/p EGD/colonoscopy, and capsule study as noted above. Abnormal capsule study with scattered small bowel erosions, abnormal small bowel with edema, inflammation/erosions. CTA with SMA noting 50% stenosis, IMA and celiac patent. CTE has been recommended to complete evaluation, but she would like to hold off on this due unless recurrent IDA.  Hgb normal now, and iron levels greatly improved. She has continued on BID iron. No overt GI bleeding. We will decrease iron to once per day and recheck labs in 3 months.    PLAN:  CBC, iron studies in 3 months Reduce iron to once daily CTE recommended: patient holding off for right now Return in 6-8 months  [name], PhD, [facility]

## 2020-11-03 NOTE — Patient Instructions (Signed)
Let's decrease iron to once daily. We will recheck labs in 3 months.  We will see you in 6-8 months!  I enjoyed seeing you again today! As you know, I value our relationship and want to provide genuine, compassionate, and quality care. I welcome your feedback. If you receive a survey regarding your visit,  I greatly appreciate you taking time to fill this out. See you next time!  [name], PhD, ANP-BC [facility]

## 2020-11-04 ENCOUNTER — Other Ambulatory Visit: Payer: Self-pay

## 2020-11-04 DIAGNOSIS — D500 Iron deficiency anemia secondary to blood loss (chronic): Secondary | ICD-10-CM

## 2020-11-04 DIAGNOSIS — D649 Anemia, unspecified: Secondary | ICD-10-CM

## 2020-11-05 ENCOUNTER — Ambulatory Visit (INDEPENDENT_AMBULATORY_CARE_PROVIDER_SITE_OTHER): Payer: Medicare Other | Admitting: *Deleted

## 2020-11-05 DIAGNOSIS — D500 Iron deficiency anemia secondary to blood loss (chronic): Secondary | ICD-10-CM | POA: Diagnosis not present

## 2020-11-05 DIAGNOSIS — I10 Essential (primary) hypertension: Secondary | ICD-10-CM

## 2020-11-05 NOTE — Patient Instructions (Signed)
Visit Information  PATIENT GOALS: Goals Addressed            This Visit's Progress   . Manage Iron Deficiency Anemia       Timeframe:  Long-Range Goal Priority:  Medium Start Date:   11/05/20                          Expected End Date:   07/24/21                      Follow Up Date 12/31/20    . don't eat dairy products, such as milk, cheese and ice cream with those high in iron . eat dark, leafy greens, such as spinach, chard and collards . include high-iron food, such as meat, chicken, fish, shellfish, dried fruit, beans and nuts in each meal . pair iron-rich foods with foods high in vitamin C, such as oranges, strawberries and red peppers . read food labels for iron, vitamin and fiber  . Take medication and supplements as prescribed . Avoid constipation by increasing fiber and water intake and by taking a stool softener as needed . Keep f/u with GI . Call GI or PCP with any new or worsening symptoms   Why is this important?    Low iron in the blood may cause anemia.   Preventing this keeps you healthy and strong.   It is best to eat foods that are high in iron and B vitamins.   Also, foods and drinks that help the body use the iron and vitamins are good for you.     Notes:        Patient verbalizes understanding of instructions provided today and agrees to view in [place].   Follow Up Plan:  . Telephone follow up appointment with care management team member scheduled for: 12/31/20 with RNCM . The patient has been provided with contact information for the care management team and has been advised to call with any health related questions or concerns.  . Next PCP appointment scheduled for: 01/21/21 with [name], FNP . Next GI appointment scheduled for: 05/18/21   Chong Sicilian, BSN, RN-BC [place] [facility] [facility] Direct Dial: [phone]

## 2020-11-05 NOTE — Chronic Care Management (AMB) (Signed)
Chronic Care Management   CCM RN Visit Note  11/05/2020 Name: [name] [name] MRN: [id number] DOB: [birth year]  Subjective: [name] [name] is a 80 y.o. year old female who is a primary care patient of Loman Brooklyn, FNP. The care management team was consulted for assistance with disease management and care coordination needs.    Engaged with patient by telephone for follow up visit in response to provider referral for case management and/or care coordination services.   Consent to Services:  The patient was given information about Chronic Care Management services, agreed to services, and gave verbal consent prior to initiation of services.  Please see initial visit note for detailed documentation.   Patient agreed to services and verbal consent obtained.   Assessment: Review of patient past medical history, allergies, medications, health status, including review of consultants reports, laboratory and other test data, was performed as part of comprehensive evaluation and provision of chronic care management services.   SDOH (Social Determinants of Health) assessments and interventions performed:    CCM Care Plan  No Known Allergies  Outpatient Encounter Medications as of 11/05/2020  Medication Sig  . acetaminophen (TYLENOL) 325 MG tablet Take 2 tablets (650 mg total) by mouth every 6 (six) hours as needed for mild pain or headache (or Fever >/= 101).  Marland Kitchen allopurinol (ZYLOPRIM) 100 MG tablet TAKE 1 TABLET BY MOUTH  DAILY  . amLODipine (NORVASC) 10 MG tablet Take 1 tablet (10 mg total) by mouth daily.  Marland Kitchen apixaban (ELIQUIS) 2.5 MG TABS tablet Take 1 tablet (2.5 mg total) by mouth 2 (two) times daily.  . Cholecalciferol (VITAMIN D-3) 5000 UNITS TABS Take 5,000 Units by mouth daily.  . diclofenac sodium (VOLTAREN) 1 % GEL Apply 4 g topically 4 (four) times daily. (Patient taking differently: Apply 4 g topically as needed.)  . EUTHYROX 50 MCG tablet TAKE 1 TABLET BY MOUTH ONCE DAILY  BEFORE BREAKFAST  . ferrous sulfate 325 (65 FE) MG tablet Take 325 mg by mouth 2 (two) times daily.  . furosemide (LASIX) 20 MG tablet TAKE 1 TABLET BY MOUTH  TWICE DAILY  . loratadine (CLARITIN) 10 MG tablet Take 10 mg by mouth daily.  . ONE TOUCH ULTRA TEST test strip USE UP TO FOUR TIMES DAILY AS DIRECTED  . oxybutynin (DITROPAN-XL) 5 MG 24 hr tablet Take 1 tablet (5 mg total) by mouth at bedtime. (Patient taking differently: Take 5 mg by mouth 3 (three) times daily.)  . pantoprazole (PROTONIX) 40 MG tablet Take 40 mg by mouth daily.  . simvastatin (ZOCOR) 40 MG tablet TAKE 1 TABLET BY MOUTH IN  THE EVENING  . valsartan (DIOVAN) 80 MG tablet Take 1 tablet (80 mg total) by mouth 2 (two) times daily.   No facility-administered encounter medications on file as of 11/05/2020.    Patient Active Problem List   Diagnosis Date Noted  . Educated about COVID-19 virus infection 02/02/2020  . Iron deficiency anemia due to chronic blood loss   . Occult blood in stools   . Primary osteoarthritis of both knees 05/14/2019  . Unprovoked DVT and Unprovoked Pulmonary Embolism -Dxed 03/2019 04/03/2019  . Sciatica of right side 10/18/2018  . Seborrheic keratosis 10/17/2016  . Hyperlipidemia associated with type 2 diabetes mellitus ([place]) 10/17/2016  . Overactive bladder 10/17/2016  . Gastroesophageal reflux disease without esophagitis 10/17/2016  . Primary osteoarthritis involving multiple joints 10/17/2016  . Chronic gout without tophus 10/17/2016  . Type 2 diabetes mellitus without complication, without  long-term current use of insulin ([place]) 05/16/2016  . Essential hypertension 05/16/2016  . Hypothyroidism 05/16/2016  . Morbid obesity ([place]) 10/30/2013  . S/P right THA, AA 10/29/2013    Conditions to be addressed/monitored:HTN, HLD and iron deficiency anemia  Care Plan : RNCM: Iron Deficiency (Adult)  Updates made by Ilean China, RN since 11/05/2020 12:00 AM    Problem: Iron Deficiency    Priority: Medium  Note:   In a patient with HTN, HLD, DM, hypothyroidism, OA, osteoporosis    Long-Range Goal: Anemia Prevented or Managed   Start Date: 11/05/2020  This Visit's Progress: On track  Priority: Medium  Note:   Objective: Lab Results  Component Value Date   WBC 6.3 10/20/2020   HGB 13.7 10/20/2020   HCT 41.3 10/20/2020   MCV 93 10/20/2020   PLT 211 10/20/2020   Lab Results  Component Value Date   IRON 98 10/20/2020   TIBC 350 10/20/2020   FERRITIN 54 10/20/2020   Current Barriers:  . Chronic Disease Management support and education needs related to anemia . On Eliquis for Afib . Neg colonoscopy and endosocpy  Nurse Case Manager Clinical Goal(s):   patient will take all medications exactly as prescribed and will call provider for medication related questions . Patient will work with gastroenterologist to address needs related to anemia . Patient will reach out to [place] as needed to assist with care coordination  Interventions:  . Collaboration with Loman Brooklyn, FNP regarding development and update of comprehensive plan of care as evidenced by provider attestation and co-signature . Inter-disciplinary care team collaboration (see longitudinal plan of care) . Basic overview and discussion of anemia/bleeding disorder or acute disease state . Medications reviewed and discussed . Recent office visit notes, imaging reports, and lab results reviewed and discussed o Hemoglobin and iron are WNL now o Holding off on additional imaging studies for now since labs ar WNL . encouraged optimal oral intake to support fluid balance and nutrition . encouraged dietary changes to increase dietary intake of iron, Vitamin X21 and folic acid as advised/prescribed  Monitored for constipation; encourage increased fluid and fiber intake, and use of stool softeners or laxatives.  . Reviewed upcoming appointments with GI and PCP . Advised to call GI or PCP with any  new or worsening symptoms and to seek appropriate medical care as needed . Provided with RNCM contact information and encouraged to reach out as needed  Patient Goals/Self-Care Activities: . don't eat dairy products, such as milk, cheese and ice cream with those high in iron . eat dark, leafy greens, such as spinach, chard and collards . include high-iron food, such as meat, chicken, fish, shellfish, dried fruit, beans and nuts in each meal . pair iron-rich foods with foods high in vitamin C, such as oranges, strawberries and red peppers . read food labels for iron, vitamin and fiber  . Take medication and supplements as prescribed . Avoid constipation by increasing fiber and water intake and by taking a stool softener as needed . Keep f/u with GI . Call GI or PCP with any new or worsening symptoms  Follow Up Plan:  . Telephone follow up appointment with care management team member scheduled for: 12/31/20 with RNCM . The patient has been provided with contact information for the care management team and has been advised to call with any health related questions or concerns.  . Next PCP appointment scheduled for: 01/21/21 with [name], FNP . Next GI  appointment scheduled for: 05/18/21   Chong Sicilian, BSN, RN-BC [place] [facility] Management Direct Dial: [phone]

## 2020-11-14 ENCOUNTER — Other Ambulatory Visit: Payer: Self-pay | Admitting: Family Medicine

## 2020-11-23 ENCOUNTER — Telehealth: Payer: Self-pay

## 2020-11-23 MED ORDER — OXYBUTYNIN CHLORIDE ER 10 MG PO TB24: 10.0000 mg | ORAL_TABLET | Freq: Every day | ORAL | 1 refills | Status: DC

## 2020-11-23 NOTE — Telephone Encounter (Signed)
New Rx sent.

## 2020-11-23 NOTE — Telephone Encounter (Signed)
Patient states she would like to be on 10mg  since she was taking 5mg  BID.  Would like rx sent [facility].

## 2020-11-23 NOTE — Telephone Encounter (Signed)
The medication lasts for 24 hours. Would she like her dosage to be increased from 5 mg to 10 mg?

## 2020-12-09 ENCOUNTER — Ambulatory Visit: Payer: Medicare Other | Admitting: Cardiology

## 2020-12-14 ENCOUNTER — Telehealth: Payer: Self-pay | Admitting: *Deleted

## 2020-12-14 NOTE — Telephone Encounter (Signed)
What is being requested?

## 2020-12-15 MED ORDER — DICLOFENAC SODIUM 1 % EX GEL: 4.0000 g | Freq: Four times a day (QID) | CUTANEOUS | 2 refills | Status: DC

## 2020-12-15 NOTE — Telephone Encounter (Signed)
Diclofenac 1% gel, sorry it will not let me click reorder

## 2020-12-15 NOTE — Telephone Encounter (Signed)
Rx sent 

## 2020-12-15 NOTE — Addendum Note (Signed)
Addended by: [name] F on: 12/15/2020 12:52 PM   Modules accepted: Orders

## 2020-12-22 ENCOUNTER — Ambulatory Visit (INDEPENDENT_AMBULATORY_CARE_PROVIDER_SITE_OTHER): Payer: Medicare Other

## 2020-12-22 VITALS — Ht 63.0 in | Wt 209.0 lb

## 2020-12-22 DIAGNOSIS — Z0000 Encounter for general adult medical examination without abnormal findings: Secondary | ICD-10-CM | POA: Diagnosis not present

## 2020-12-22 NOTE — Progress Notes (Signed)
Subjective:   [name] [name] is a 80 y.o. female who presents for Medicare Annual (Subsequent) preventive examination.  Virtual Visit via Telephone Note  I connected with  [name] [name] on 12/22/20 at  2:00 PM EDT by telephone and verified that I am speaking with the correct person using two identifiers.  Location: Patient: Home Provider: [facility] Persons participating in the virtual visit: patient/Nurse Health Advisor   I discussed the limitations, risks, security and privacy concerns of performing an evaluation and management service by telephone and the availability of in person appointments. The patient expressed understanding and agreed to proceed.  Interactive audio and video telecommunications were attempted between this nurse and patient, however failed, due to patient having technical difficulties OR patient did not have access to video capability.  We continued and completed visit with audio only.  Some vital signs may be absent or patient reported.   [name] [name] E Ein Rijo, LPN   Review of Systems     Cardiac Risk Factors include: advanced age (>31men, >70 women);diabetes mellitus;dyslipidemia;obesity (BMI >30kg/m2);sedentary lifestyle;hypertension     Objective:    Today's Vitals   12/22/20 1401  Weight: 209 lb (94.8 kg)  Height: 5\' 3"  (1.6 m)   Body mass index is 37.02 kg/m.  Advanced Directives 12/22/2020 02/17/2020 01/22/2020 12/06/2019 12/06/2019 11/21/2019 04/03/2019  Does Patient Have a Medical Advance Directive? Yes Yes Yes Yes Yes Yes Yes  Type of Paramedic of [place];Living will - Healthcare Power of [place] will;Healthcare Power of [place]  Does patient want to make changes to medical advance directive? - - No - Patient declined No - Patient declined - - No - Patient declined  Copy of [place] in Chart? No - copy requested - No - copy  requested No - copy requested - - -  Pre-existing out of facility DNR order (yellow form or pink MOST form) - - - - - - -    Current Medications (verified) Outpatient Encounter Medications as of 12/22/2020  Medication Sig  . acetaminophen (TYLENOL) 325 MG tablet Take 2 tablets (650 mg total) by mouth every 6 (six) hours as needed for mild pain or headache (or Fever >/= 101).  Marland Kitchen allopurinol (ZYLOPRIM) 100 MG tablet TAKE 1 TABLET BY MOUTH  DAILY  . amLODipine (NORVASC) 10 MG tablet TAKE 1 TABLET BY MOUTH  DAILY  . apixaban (ELIQUIS) 2.5 MG TABS tablet Take 1 tablet (2.5 mg total) by mouth 2 (two) times daily.  . Cholecalciferol (VITAMIN D-3) 5000 UNITS TABS Take 5,000 Units by mouth daily.  . diclofenac Sodium (VOLTAREN) 1 % GEL Apply 4 g topically 4 (four) times daily.  . EUTHYROX 50 MCG tablet TAKE 1 TABLET BY MOUTH ONCE DAILY BEFORE BREAKFAST  . ferrous sulfate 325 (65 FE) MG tablet Take 325 mg by mouth 2 (two) times daily.  . furosemide (LASIX) 20 MG tablet TAKE 1 TABLET BY MOUTH  TWICE DAILY  . loratadine (CLARITIN) 10 MG tablet Take 10 mg by mouth daily.  . ONE TOUCH ULTRA TEST test strip USE UP TO FOUR TIMES DAILY AS DIRECTED  . oxybutynin (DITROPAN-XL) 10 MG 24 hr tablet Take 1 tablet (10 mg total) by mouth at bedtime.  . pantoprazole (PROTONIX) 40 MG tablet Take 1 tablet (40 mg total) by mouth 2 (two) times daily before a meal.  . simvastatin (ZOCOR) 40 MG tablet TAKE 1 TABLET BY MOUTH IN  THE EVENING  . valsartan (DIOVAN) 80 MG tablet Take 1 tablet (80 mg total) by mouth 2 (two) times daily.   No facility-administered encounter medications on file as of 12/22/2020.    Allergies (verified) Patient has no known allergies.   History: Past Medical History:  Diagnosis Date  . Anemia   . Arthritis    Ostearthritis- hips, knees, fingers  . Diabetes mellitus without complication ([place])   . DVT (deep venous thrombosis) ([place])   . Dyspnea   . GERD (gastroesophageal reflux disease)    . Headache(784.0)    tx. Valproic acid  . Hypertension   . Hypothyroidism   . Pulmonary embolism [facility])    Past Surgical History:  Procedure Laterality Date  . ABDOMINAL HYSTERECTOMY    . BIOPSY  12/08/2019   Procedure: BIOPSY;  Surgeon: Daneil Dolin, MD;  Location[facility];  Service: Endoscopy;;  . CATARACT EXTRACTION, BILATERAL    . COLONOSCOPY N/A 12/08/2019   polyps (tubular adenoma), diverticulosis, colonic lipoma, no surveillance due to age  . 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  [name] N/A 01/13/2020   Procedure: GIVENS CAPSULE STUDY;  Surgeon: Daneil Dolin, MD;  Location: [facility];  Service: Endoscopy;  Laterality: N/A;  7:30am  . MULTIPLE TOOTH EXTRACTIONS     60's  . PARATHYROIDECTOMY    . POLYPECTOMY  12/08/2019   Procedure: POLYPECTOMY;  Surgeon: Daneil Dolin, MD;  Location: [facility];  Service: Endoscopy;;  . TOTAL HIP ARTHROPLASTY Right 10/29/2013   Procedure: RIGHT TOTAL HIP ARTHROPLASTY ANTERIOR APPROACH;  Surgeon: Mauri Pole, MD;  Location: [facility];  Service: Orthopedics;  Laterality: Right;   Family History  Problem Relation Age of Onset  . Colitis Sister 74       alive  . Cancer Sister 22       colon  . Cancer Mother 32       uterine  . Heart disease Father 80       heart failure  . Heart attack Brother 22  . Healthy Daughter   . Healthy Son   . Pulmonary embolism Brother 17  . Heart disease Brother   . Healthy Son   . Healthy Son    Social History   Socioeconomic History  . Marital status: Widowed    Spouse name: Not on file  . Number of children: 4  . Years of education: 34  . Highest education level: High school graduate  Occupational History  . 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 Smoking status: Never Smoker  . Smokeless tobacco: Never Used  Vaping Use  . Vaping Use:  Never used  Substance and Sexual Activity  . Alcohol use: No    Alcohol/week: 0.0 standard drinks  . Drug use: No  . Sexual activity: Not Currently  Other Topics Concern  . Not on file  Social History Narrative   Patient is widowed and lives in a one story home. She has four adult children and one son lives with her.    Social Determinants of Health   Financial Resource Strain: Low Risk   . Difficulty of Paying Living Expenses: Not hard at all  Food Insecurity: No Food Insecurity  . Worried About Charity fundraiser in the Last Year: Never true  . Ran Out of Food in the Last Year: Never true  Transportation Needs: No Transportation Needs  . Lack of Transportation (Medical): No  . Lack of Transportation (Non-Medical): No  Physical Activity: Insufficiently Active  . Days of Exercise per Week: 7 days  . Minutes of Exercise per Session: 20 min  Stress: No Stress Concern Present  . Feeling of Stress : Not at all  Social Connections: Moderately Integrated  . Frequency of Communication with Friends and Family: More than three times a week  . Frequency of Social Gatherings with Friends and Family: More than three times a week  . Attends Religious Services: More than 4 times per year  . Active Member of Clubs or Organizations: Yes  . Attends Archivist Meetings: More than 4 times per year  . Marital Status: Widowed    Tobacco Counseling Counseling given: Not Answered   Clinical Intake:  Pre-visit preparation completed: Yes  Pain : No/denies pain     BMI - recorded: 37.02 Nutritional Status: BMI > 30  Obese Nutritional Risks: None Diabetes: Yes CBG done?: No Did pt. bring in CBG monitor from home?: No  How often do you need to have someone help you when you read instructions, pamphlets, or other written materials from your doctor or pharmacy?: 1 - Never  Nutrition Risk Assessment:  Has the patient had any N/V/D within the last 2 months?  No  Does the patient  have any non-healing wounds?  No  Has the patient had any unintentional weight loss or weight gain?  No   Diabetes:  Is the patient diabetic?  Yes  If diabetic, was a CBG obtained today?  No  Did the patient bring in their glucometer from home?  No  How often do you monitor your CBG's? Once or twice per week - 101 this morning per patient.   Financial Strains and Diabetes Management:  Are you having any financial strains with the device, your supplies or your medication? No .  Does the patient want to be seen by Chronic Care Management for management of their diabetes?  No  Would the patient like to be referred to a Nutritionist or for Diabetic Management?  No   Diabetic Exams:  Diabetic Eye Exam: Overdue for diabetic eye exam. Pt has been advised about the importance in completing this exam.  Diabetic Foot Exam: Completed 12/03/19. Pt has been advised about the importance in completing this exam. Pt is scheduled for diabetic foot exam on next year.    Interpreter Needed?: No  Information entered by :: Steffan Caniglia, LPN   Activities of Daily Living In your present state of health, do you have any difficulty performing the following activities: 12/22/2020  Hearing? N  Vision? N  Difficulty concentrating or making decisions? N  Walking or climbing stairs? Y  Dressing or bathing? N  Doing errands, shopping? N  Preparing Food and eating ? N  Using the Toilet? N  In the past six months, have you accidently leaked urine? Y  Comment wears pads for protection  Do you have problems with loss of bowel control? N  Managing your Medications? N  Managing your Finances? N  Housekeeping or managing your Housekeeping? N  Some recent data might be hidden    Patient Care Team: Loman Brooklyn, FNP as PCP - General (Family Medicine) Paralee Cancel, MD as Consulting Physician (Orthopedic Surgery) Ilean China, RN as Case Manager [name], NP (Gastroenterology) Minus Breeding, MD  as Consulting Physician (Cardiology)  Indicate any recent Medical Services you  may have received from other than [facility] providers in the past year (date may be approximate).     Assessment:   This is a routine wellness examination for [facility].  Hearing/Vision screen  Hearing Screening   125Hz  250Hz  500Hz  1000Hz  2000Hz  3000Hz  4000Hz  6000Hz  8000Hz   Right ear:           Left ear:           Comments: Denies hearing difficulties  Vision Screening Comments: Wears reading glasses only. No eye doctor.  Dietary issues and exercise activities discussed: Current Exercise Habits: Home exercise routine, Type of exercise: walking, Time (Minutes): 20, Frequency (Times/Week): 7, Weekly Exercise (Minutes/Week): 140, Intensity: Mild, Exercise limited by: orthopedic condition(s)  Goals Addressed            This Visit's Progress   . Patient Stated   On track    11/21/2019 AWV Goal: Exercise for General Health   Patient will verbalize understanding of the benefits of increased physical activity:  Exercising regularly is important. It will improve your overall fitness, flexibility, and endurance.  Regular exercise also will improve your overall health. It can help you control your weight, reduce stress, and improve your bone density.  Over the next year, patient will increase physical activity as tolerated with a goal of at least 150 minutes of moderate physical activity per week.   You can tell that you are exercising at a moderate intensity if your heart starts beating faster and you start breathing faster but can still hold a conversation.  Moderate-intensity exercise ideas include:  Walking 1 mile (1.6 km) in about 15 minutes  Biking  Hiking  Golfing  Dancing  Water aerobics  Patient will verbalize understanding of everyday activities that increase physical activity by providing examples like the following: ? Yard work, such as: ? Pushing a Conservation officer, nature ? Raking and bagging  leaves ? Washing your car ? Pushing a stroller ? Shoveling snow ? Gardening ? Washing windows or floors  Patient will be able to explain general safety guidelines for exercising:   Before you start a new exercise program, talk with your health care provider.  Do not exercise so much that you hurt yourself, feel dizzy, or get very short of breath.  Wear comfortable clothes and wear shoes with good support.  Drink plenty of water while you exercise to prevent dehydration or heat stroke.  Work out until your breathing and your heartbeat get faster.       Depression Screen PHQ 2/9 Scores 12/22/2020 10/20/2020 06/04/2020 01/30/2020 12/11/2019 12/03/2019 05/14/2019  PHQ - 2 Score 0 0 0 0 0 0 0    Fall Risk Fall Risk  12/22/2020 10/20/2020 06/04/2020 01/30/2020 12/11/2019  Falls in the past year? 0 0 0 0 0  Number falls in past yr: 0 - - - -  Injury with Fall? 0 - - - -  Risk for fall due to : Medication side effect;Orthopedic patient;Impaired balance/gait - - - -  Risk for fall due to: Comment - - - - -  Follow up Falls prevention discussed;Education provided - Falls evaluation completed - -    FALL RISK PREVENTION PERTAINING TO THE HOME:  Any stairs in or around the home? No  If so, are there any without handrails? No  Home free of loose throw rugs in walkways, pet beds, electrical cords, etc? Yes  Adequate lighting in your home to reduce risk of falls? Yes   ASSISTIVE DEVICES UTILIZED TO PREVENT FALLS:  Life alert? No  Use of a cane, walker or w/c? Yes  Grab bars in the bathroom? Yes  Shower chair or bench in shower? Yes  Elevated toilet seat or a handicapped toilet? Yes   TIMED UP AND GO:  Was the test performed? No . Telephonic visit  Cognitive Function: Normal cognitive status assessed by direct observation by this Nurse Health Advisor. No abnormalities found.   MMSE - Mini Mental State Exam 08/01/2017  Orientation to time 5  Orientation to Place 5  Registration 3   Attention/ Calculation 5  Recall 2  Language- name 2 objects 2  Language- repeat 1  Language- follow 3 step command 3  Language- read & follow direction 1  Write a sentence 1  Copy design 1  Total score 29     6CIT Screen 11/21/2019  What Year? 0 points  What month? 0 points  What time? 0 points  Count back from 20 0 points  Months in reverse 0 points  Repeat phrase 0 points  Total Score 0    Immunizations Immunization History  Administered Date(s) Administered  . Fluad Quad(high Dose 65+) 04/04/2019, 06/04/2020  . Influenza, High Dose Seasonal PF 05/16/2016, 08/01/2017, 08/28/2018  . Moderna SARS-COV2 Booster Vaccination 07/02/2020  . Moderna Sars-Covid-2 Vaccination 09/18/2019, 10/16/2019  . Pneumococcal Conjugate-13 07/02/2020  . Zoster Recombinat (Shingrix) 12/03/2019, 10/20/2020    TDAP status: Due, Education has been provided regarding the importance of this vaccine. Advised may receive this vaccine at local pharmacy or Health Dept. Aware to provide a copy of the vaccination record if obtained from local pharmacy or Health Dept. Verbalized acceptance and understanding.  Flu Vaccine status: Up to date  Pneumococcal vaccine status: Due, Education has been provided regarding the importance of this vaccine. Advised may receive this vaccine at local pharmacy or Health Dept. Aware to provide a copy of the vaccination record if obtained from local pharmacy or Health Dept. Verbalized acceptance and understanding.  Covid-19 vaccine status: Completed vaccines  Qualifies for Shingles Vaccine? Yes   Zostavax completed Yes   Shingrix Completed?: Yes  Screening Tests Health Maintenance  Topic Date Due  . OPHTHALMOLOGY EXAM  Never done  . DEXA SCAN  09/01/2019  . COVID-19 Vaccine (4 - Booster for Moderna series) 09/30/2020  . FOOT EXAM  12/02/2020  . TETANUS/TDAP  10/20/2021 (Originally 08/16/1959)  . Hepatitis C Screening  10/20/2021 (Originally 08/17/1958)  . INFLUENZA  VACCINE  02/22/2021  . HEMOGLOBIN A1C  04/22/2021  . PNA vac Low Risk Adult (2 of 2 - PPSV23) 07/02/2021  . Zoster Vaccines- Shingrix  Completed  . HPV VACCINES  Aged Out    Health Maintenance  Health Maintenance Due  Topic Date Due  . OPHTHALMOLOGY EXAM  Never done  . DEXA SCAN  09/01/2019  . COVID-19 Vaccine (4 - Booster for Moderna series) 09/30/2020  . FOOT EXAM  12/02/2020    Colorectal cancer screening: Type of screening: Colonoscopy. Completed 12/08/2019. Repeat every 10 years  Mammogram status: No longer required due to age.  Bone Density status: Completed 08/31/2017. Results reflect: Bone density results: OSTEOPENIA. Repeat every 2 years.  Lung Cancer Screening: (Low Dose CT Chest recommended if Age 8-80 years, 30 pack-year currently smoking OR have quit w/in 15years.) does not qualify.   Additional Screening:  Hepatitis C Screening: does not qualify  Vision Screening: Recommended annual ophthalmology exams for early detection of glaucoma and other disorders of the eye. Is the patient up to date with their annual  eye exam?  No  Who is the provider or what is the name of the office in which the patient attends annual eye exams? n/a If pt is not established with a provider, would they like to be referred to a provider to establish care? No .   Dental Screening: Recommended annual dental exams for proper oral hygiene  Community Resource Referral / Chronic Care Management: CRR required this visit?  No   CCM required this visit?  No      Plan:     I have personally reviewed and noted the following in the patient's chart:   . Medical and social history . Use of alcohol, tobacco or illicit drugs  . Current medications and supplements including opioid prescriptions.  . Functional ability and status . Nutritional status . Physical activity . Advanced directives . List of other physicians . Hospitalizations, surgeries, and ER visits in previous 12  months . Vitals . Screenings to include cognitive, depression, and falls . Referrals and appointments  In addition, I have reviewed and discussed with patient certain preventive protocols, quality metrics, and best practice recommendations. A written personalized care plan for preventive services as well as general preventive health recommendations were provided to patient.     Sandrea Hammond, LPN   03/04/5725   Nurse Notes: None

## 2020-12-22 NOTE — Patient Instructions (Signed)
[name] , Thank you for taking time to come for your Medicare Wellness Visit. I appreciate your ongoing commitment to your health goals. Please review the following plan we discussed and let me know if I can assist you in the future.   Screening recommendations/referrals: Colonoscopy: Done 12/08/19 - Repeat not required Mammogram: No longer required Bone Density: Done 08/31/2017 - Repeat every 2 years Recommended yearly ophthalmology/optometry visit for glaucoma screening and checkup Recommended yearly dental visit for hygiene and checkup  Vaccinations: Influenza vaccine: Done 06/04/2020 - Repeat annually Pneumococcal vaccine: Prevnar done 07/02/2020; Get Pneumovax after 06/2021 Tdap vaccine: Due (every 10 years) Shingles vaccine: Done 12/03/2019 & 10/20/2020   Covid-19: Done 09/18/19, 10/16/19, & 07/02/2020  Advanced directives: Please bring a copy of your health care power of attorney and living will to the office to be added to your chart at your convenience.  Conditions/risks identified: Aim for 30 minutes of exercise or brisk walking each day, drink 6-8 glasses of water and eat lots of fruits and vegetables.  Next appointment: Follow up in one year for your annual wellness visit    Preventive Care 65 Years and Older, Female Preventive care refers to lifestyle choices and visits with your health care provider that can promote health and wellness. What does preventive care include?  A yearly physical exam. This is also called an annual well check.  Dental exams once or twice a year.  Routine eye exams. Ask your health care provider how often you should have your eyes checked.  Personal lifestyle choices, including:  Daily care of your teeth and gums.  Regular physical activity.  Eating a healthy diet.  Avoiding tobacco and drug use.  Limiting alcohol use.  Practicing safe sex.  Taking low-dose aspirin every day.  Taking vitamin and mineral supplements as recommended by  your health care provider. What happens during an annual well check? The services and screenings done by your health care provider during your annual well check will depend on your age, overall health, lifestyle risk factors, and family history of disease. Counseling  Your health care provider may ask you questions about your:  Alcohol use.  Tobacco use.  Drug use.  Emotional well-being.  Home and relationship well-being.  Sexual activity.  Eating habits.  History of falls.  Memory and ability to understand (cognition).  Work and work Statistician.  Reproductive health. Screening  You may have the following tests or measurements:  Height, weight, and BMI.  Blood pressure.  Lipid and cholesterol levels. These may be checked every 5 years, or more frequently if you are over 45 years old.  Skin check.  Lung cancer screening. You may have this screening every year starting at age 77 if you have a 30-pack-year history of smoking and currently smoke or have quit within the past 15 years.  Fecal occult blood test (FOBT) of the stool. You may have this test every year starting at age 20.  Flexible sigmoidoscopy or colonoscopy. You may have a sigmoidoscopy every 5 years or a colonoscopy every 10 years starting at age 34.  Hepatitis C blood test.  Hepatitis B blood test.  Sexually transmitted disease (STD) testing.  Diabetes screening. This is done by checking your blood sugar (glucose) after you have not eaten for a while (fasting). You may have this done every 1-3 years.  Bone density scan. This is done to screen for osteoporosis. You may have this done starting at age 10.  Mammogram. This may be done  every 1-2 years. Talk to your health care provider about how often you should have regular mammograms. Talk with your health care provider about your test results, treatment options, and if necessary, the need for more tests. Vaccines  Your health care provider may  recommend certain vaccines, such as:  Influenza vaccine. This is recommended every year.  Tetanus, diphtheria, and acellular pertussis (Tdap, Td) vaccine. You may need a Td booster every 10 years.  Zoster vaccine. You may need this after age 69.  Pneumococcal 13-valent conjugate (PCV13) vaccine. One dose is recommended after age 37.  Pneumococcal polysaccharide (PPSV23) vaccine. One dose is recommended after age 70. Talk to your health care provider about which screenings and vaccines you need and how often you need them. This information is not intended to replace advice given to you by your health care provider. Make sure you discuss any questions you have with your health care provider. Document Released: 08/07/2015 Document Revised: 03/30/2016 Document Reviewed: 05/12/2015 Elsevier Interactive Patient Education  2017 [place] Prevention in the Home Falls can cause injuries. They can happen to people of all ages. There are many things you can do to make your home safe and to help prevent falls. What can I do on the outside of my home?  Regularly fix the edges of walkways and driveways and fix any cracks.  Remove anything that might make you trip as you walk through a door, such as a raised step or threshold.  Trim any bushes or trees on the path to your home.  Use bright outdoor lighting.  Clear any walking paths of anything that might make someone trip, such as rocks or tools.  Regularly check to see if handrails are loose or broken. Make sure that both sides of any steps have handrails.  Any raised decks and porches should have guardrails on the edges.  Have any leaves, snow, or ice cleared regularly.  Use sand or salt on walking paths during winter.  Clean up any spills in your garage right away. This includes oil or grease spills. What can I do in the bathroom?  Use night lights.  Install grab bars by the toilet and in the tub and shower. Do not use towel  bars as grab bars.  Use non-skid mats or decals in the tub or shower.  If you need to sit down in the shower, use a plastic, non-slip stool.  Keep the floor dry. Clean up any water that spills on the floor as soon as it happens.  Remove soap buildup in the tub or shower regularly.  Attach bath mats securely with double-sided non-slip rug tape.  Do not have throw rugs and other things on the floor that can make you trip. What can I do in the bedroom?  Use night lights.  Make sure that you have a light by your bed that is easy to reach.  Do not use any sheets or blankets that are too big for your bed. They should not hang down onto the floor.  Have a firm chair that has side arms. You can use this for support while you get dressed.  Do not have throw rugs and other things on the floor that can make you trip. What can I do in the kitchen?  Clean up any spills right away.  Avoid walking on wet floors.  Keep items that you use a lot in easy-to-reach places.  If you need to reach something above you, use a  strong step stool that has a grab bar.  Keep electrical cords out of the way.  Do not use floor polish or wax that makes floors slippery. If you must use wax, use non-skid floor wax.  Do not have throw rugs and other things on the floor that can make you trip. What can I do with my stairs?  Do not leave any items on the stairs.  Make sure that there are handrails on both sides of the stairs and use them. Fix handrails that are broken or loose. Make sure that handrails are as long as the stairways.  Check any carpeting to make sure that it is firmly attached to the stairs. Fix any carpet that is loose or worn.  Avoid having throw rugs at the top or bottom of the stairs. If you do have throw rugs, attach them to the floor with carpet tape.  Make sure that you have a light switch at the top of the stairs and the bottom of the stairs. If you do not have them, ask someone to  add them for you. What else can I do to help prevent falls?  Wear shoes that:  Do not have high heels.  Have rubber bottoms.  Are comfortable and fit you well.  Are closed at the toe. Do not wear sandals.  If you use a stepladder:  Make sure that it is fully opened. Do not climb a closed stepladder.  Make sure that both sides of the stepladder are locked into place.  Ask someone to hold it for you, if possible.  Clearly mark and make sure that you can see:  Any grab bars or handrails.  First and last steps.  Where the edge of each step is.  Use tools that help you move around (mobility aids) if they are needed. These include:  Canes.  Walkers.  Scooters.  Crutches.  Turn on the lights when you go into a dark area. Replace any light bulbs as soon as they burn out.  Set up your furniture so you have a clear path. Avoid moving your furniture around.  If any of your floors are uneven, fix them.  If there are any pets around you, be aware of where they are.  Review your medicines with your doctor. Some medicines can make you feel dizzy. This can increase your chance of falling. Ask your doctor what other things that you can do to help prevent falls. This information is not intended to replace advice given to you by your health care provider. Make sure you discuss any questions you have with your health care provider. Document Released: 05/07/2009 Document Revised: 12/17/2015 Document Reviewed: 08/15/2014 Elsevier Interactive Patient Education  2017 [employer].

## 2020-12-24 ENCOUNTER — Inpatient Hospital Stay (HOSPITAL_COMMUNITY)
Admission: EM | Admit: 2020-12-24 | Discharge: 2020-12-30 | DRG: 853 | Disposition: A | Discharge status: Home Health | Payer: Medicare Other | Attending: Internal Medicine | Admitting: Internal Medicine

## 2020-12-24 ENCOUNTER — Other Ambulatory Visit: Payer: Self-pay

## 2020-12-24 ENCOUNTER — Emergency Department (HOSPITAL_COMMUNITY): Payer: Medicare Other

## 2020-12-24 ENCOUNTER — Encounter (HOSPITAL_COMMUNITY): Payer: Self-pay

## 2020-12-24 DIAGNOSIS — E119 Type 2 diabetes mellitus without complications: Secondary | ICD-10-CM | POA: Diagnosis present

## 2020-12-24 DIAGNOSIS — Z452 Encounter for adjustment and management of vascular access device: Secondary | ICD-10-CM | POA: Diagnosis not present

## 2020-12-24 DIAGNOSIS — I10 Essential (primary) hypertension: Secondary | ICD-10-CM | POA: Diagnosis not present

## 2020-12-24 DIAGNOSIS — J9811 Atelectasis: Secondary | ICD-10-CM | POA: Diagnosis present

## 2020-12-24 DIAGNOSIS — I700 Atherosclerosis of aorta: Secondary | ICD-10-CM | POA: Diagnosis not present

## 2020-12-24 DIAGNOSIS — A4151 Sepsis due to Escherichia coli [E. coli]: Secondary | ICD-10-CM | POA: Diagnosis present

## 2020-12-24 DIAGNOSIS — Z79899 Other long term (current) drug therapy: Secondary | ICD-10-CM

## 2020-12-24 DIAGNOSIS — D649 Anemia, unspecified: Secondary | ICD-10-CM | POA: Diagnosis not present

## 2020-12-24 DIAGNOSIS — N179 Acute kidney failure, unspecified: Secondary | ICD-10-CM

## 2020-12-24 DIAGNOSIS — E039 Hypothyroidism, unspecified: Secondary | ICD-10-CM | POA: Diagnosis not present

## 2020-12-24 DIAGNOSIS — N136 Pyonephrosis: Secondary | ICD-10-CM | POA: Diagnosis present

## 2020-12-24 DIAGNOSIS — E785 Hyperlipidemia, unspecified: Secondary | ICD-10-CM | POA: Diagnosis not present

## 2020-12-24 DIAGNOSIS — I4891 Unspecified atrial fibrillation: Secondary | ICD-10-CM | POA: Diagnosis present

## 2020-12-24 DIAGNOSIS — R0689 Other abnormalities of breathing: Secondary | ICD-10-CM | POA: Diagnosis not present

## 2020-12-24 DIAGNOSIS — E876 Hypokalemia: Secondary | ICD-10-CM | POA: Diagnosis present

## 2020-12-24 DIAGNOSIS — Z4682 Encounter for fitting and adjustment of non-vascular catheter: Secondary | ICD-10-CM | POA: Diagnosis not present

## 2020-12-24 DIAGNOSIS — R0602 Shortness of breath: Secondary | ICD-10-CM | POA: Diagnosis not present

## 2020-12-24 DIAGNOSIS — Z86718 Personal history of other venous thrombosis and embolism: Secondary | ICD-10-CM

## 2020-12-24 DIAGNOSIS — R6521 Severe sepsis with septic shock: Secondary | ICD-10-CM | POA: Diagnosis not present

## 2020-12-24 DIAGNOSIS — Z4659 Encounter for fitting and adjustment of other gastrointestinal appliance and device: Secondary | ICD-10-CM

## 2020-12-24 DIAGNOSIS — I517 Cardiomegaly: Secondary | ICD-10-CM | POA: Diagnosis not present

## 2020-12-24 DIAGNOSIS — B9620 Unspecified Escherichia coli [E. coli] as the cause of diseases classified elsewhere: Secondary | ICD-10-CM | POA: Diagnosis not present

## 2020-12-24 DIAGNOSIS — K828 Other specified diseases of gallbladder: Secondary | ICD-10-CM | POA: Diagnosis not present

## 2020-12-24 DIAGNOSIS — N200 Calculus of kidney: Secondary | ICD-10-CM | POA: Diagnosis not present

## 2020-12-24 DIAGNOSIS — Z20822 Contact with and (suspected) exposure to covid-19: Secondary | ICD-10-CM | POA: Diagnosis not present

## 2020-12-24 DIAGNOSIS — N3090 Cystitis, unspecified without hematuria: Secondary | ICD-10-CM | POA: Diagnosis not present

## 2020-12-24 DIAGNOSIS — R6889 Other general symptoms and signs: Secondary | ICD-10-CM | POA: Diagnosis not present

## 2020-12-24 DIAGNOSIS — N390 Urinary tract infection, site not specified: Secondary | ICD-10-CM | POA: Diagnosis not present

## 2020-12-24 DIAGNOSIS — K219 Gastro-esophageal reflux disease without esophagitis: Secondary | ICD-10-CM | POA: Diagnosis not present

## 2020-12-24 DIAGNOSIS — D696 Thrombocytopenia, unspecified: Secondary | ICD-10-CM | POA: Diagnosis not present

## 2020-12-24 DIAGNOSIS — Z8249 Family history of ischemic heart disease and other diseases of the circulatory system: Secondary | ICD-10-CM

## 2020-12-24 DIAGNOSIS — E861 Hypovolemia: Secondary | ICD-10-CM | POA: Diagnosis not present

## 2020-12-24 DIAGNOSIS — Z86711 Personal history of pulmonary embolism: Secondary | ICD-10-CM

## 2020-12-24 DIAGNOSIS — Z6839 Body mass index (BMI) 39.0-39.9, adult: Secondary | ICD-10-CM

## 2020-12-24 DIAGNOSIS — E872 Acidosis: Secondary | ICD-10-CM | POA: Diagnosis not present

## 2020-12-24 DIAGNOSIS — I248 Other forms of acute ischemic heart disease: Secondary | ICD-10-CM | POA: Diagnosis present

## 2020-12-24 DIAGNOSIS — M4316 Spondylolisthesis, lumbar region: Secondary | ICD-10-CM | POA: Diagnosis not present

## 2020-12-24 DIAGNOSIS — R0789 Other chest pain: Secondary | ICD-10-CM | POA: Diagnosis not present

## 2020-12-24 DIAGNOSIS — Z743 Need for continuous supervision: Secondary | ICD-10-CM | POA: Diagnosis not present

## 2020-12-24 DIAGNOSIS — A419 Sepsis, unspecified organism: Secondary | ICD-10-CM | POA: Diagnosis present

## 2020-12-24 DIAGNOSIS — R7881 Bacteremia: Secondary | ICD-10-CM | POA: Diagnosis not present

## 2020-12-24 DIAGNOSIS — N281 Cyst of kidney, acquired: Secondary | ICD-10-CM | POA: Diagnosis not present

## 2020-12-24 DIAGNOSIS — Z7901 Long term (current) use of anticoagulants: Secondary | ICD-10-CM

## 2020-12-24 DIAGNOSIS — M109 Gout, unspecified: Secondary | ICD-10-CM | POA: Diagnosis present

## 2020-12-24 DIAGNOSIS — R079 Chest pain, unspecified: Secondary | ICD-10-CM | POA: Diagnosis not present

## 2020-12-24 DIAGNOSIS — J9601 Acute respiratory failure with hypoxia: Secondary | ICD-10-CM | POA: Diagnosis not present

## 2020-12-24 DIAGNOSIS — I499 Cardiac arrhythmia, unspecified: Secondary | ICD-10-CM | POA: Diagnosis not present

## 2020-12-24 DIAGNOSIS — N10 Acute tubulo-interstitial nephritis: Secondary | ICD-10-CM | POA: Diagnosis not present

## 2020-12-24 DIAGNOSIS — N132 Hydronephrosis with renal and ureteral calculous obstruction: Secondary | ICD-10-CM | POA: Diagnosis not present

## 2020-12-24 LAB — HEPATIC FUNCTION PANEL
ALT: 19 U/L (ref 0–44)
AST: 23 U/L (ref 15–41)
Albumin: 3.4 g/dL — ABNORMAL LOW (ref 3.5–5.0)
Alkaline Phosphatase: 123 U/L (ref 38–126)
Bilirubin, Direct: 0.3 mg/dL — ABNORMAL HIGH (ref 0.0–0.2)
Indirect Bilirubin: 0.7 mg/dL (ref 0.3–0.9)
Total Bilirubin: 1 mg/dL (ref 0.3–1.2)
Total Protein: 6.4 g/dL — ABNORMAL LOW (ref 6.5–8.1)

## 2020-12-24 LAB — BLOOD GAS, ARTERIAL
Acid-base deficit: 12.9 mmol/L — ABNORMAL HIGH (ref 0.0–2.0)
Bicarbonate: 14.6 mmol/L — ABNORMAL LOW (ref 20.0–28.0)
FIO2: 100
O2 Saturation: 98.4 %
Patient temperature: 37
pCO2 arterial: 29.4 mmHg — ABNORMAL LOW (ref 32.0–48.0)
pH, Arterial: 7.261 — ABNORMAL LOW (ref 7.350–7.450)
pO2, Arterial: 160 mmHg — ABNORMAL HIGH (ref 83.0–108.0)

## 2020-12-24 LAB — APTT: aPTT: 42 seconds — ABNORMAL HIGH (ref 24–36)

## 2020-12-24 LAB — URINALYSIS, ROUTINE W REFLEX MICROSCOPIC
Bilirubin Urine: NEGATIVE
Glucose, UA: NEGATIVE mg/dL
Ketones, ur: NEGATIVE mg/dL
Nitrite: NEGATIVE
Protein, ur: 100 mg/dL — AB
Specific Gravity, Urine: 1.013 (ref 1.005–1.030)
WBC, UA: >50 WBC/hpf — ABNORMAL HIGH (ref 0–5)
pH: 5 (ref 5.0–8.0)

## 2020-12-24 LAB — RESP PANEL BY RT-PCR (FLU A&B, COVID) ARPGX2
Influenza A by PCR: NEGATIVE
Influenza B by PCR: NEGATIVE
SARS Coronavirus 2 by RT PCR: NEGATIVE

## 2020-12-24 LAB — BASIC METABOLIC PANEL
Anion gap: 12 (ref 5–15)
BUN: 68 mg/dL — ABNORMAL HIGH (ref 8–23)
CO2: 20 mmol/L — ABNORMAL LOW (ref 22–32)
Calcium: 10 mg/dL (ref 8.9–10.3)
Chloride: 101 mmol/L (ref 98–111)
Creatinine, Ser: 4.26 mg/dL — ABNORMAL HIGH (ref 0.44–1.00)
GFR, Estimated: 10 mL/min — ABNORMAL LOW (ref >60)
Glucose, Bld: 109 mg/dL — ABNORMAL HIGH (ref 70–99)
Potassium: 3.7 mmol/L (ref 3.5–5.1)
Sodium: 133 mmol/L — ABNORMAL LOW (ref 135–145)

## 2020-12-24 LAB — PROTIME-INR
INR: 2.2 — ABNORMAL HIGH (ref 0.8–1.2)
Prothrombin Time: 24.7 seconds — ABNORMAL HIGH (ref 11.4–15.2)

## 2020-12-24 LAB — TROPONIN I (HIGH SENSITIVITY)
Troponin I (High Sensitivity): 280 ng/L (ref <18)
Troponin I (High Sensitivity): 256 ng/L (ref <18)

## 2020-12-24 LAB — CBC
HCT: 38.6 % (ref 36.0–46.0)
Hemoglobin: 12.9 g/dL (ref 12.0–15.0)
MCH: 31.7 pg (ref 26.0–34.0)
MCHC: 33.4 g/dL (ref 30.0–36.0)
MCV: 94.8 fL (ref 80.0–100.0)
Platelets: 61 10*3/uL — ABNORMAL LOW (ref 150–400)
RBC: 4.07 MIL/uL (ref 3.87–5.11)
RDW: 13.9 % (ref 11.5–15.5)
WBC: 15.6 10*3/uL — ABNORMAL HIGH (ref 4.0–10.5)
nRBC: 0 % (ref 0.0–0.2)

## 2020-12-24 LAB — LIPASE, BLOOD: Lipase: 15 U/L (ref 11–51)

## 2020-12-24 LAB — LACTIC ACID, PLASMA: Lactic Acid, Venous: 3.4 mmol/L (ref 0.5–1.9)

## 2020-12-24 MED ORDER — SODIUM CHLORIDE 0.9 % IV BOLUS
500.0000 mL | Freq: Once | INTRAVENOUS | Status: AC
  Administered 2020-12-24: 500 mL via INTRAVENOUS

## 2020-12-24 MED ORDER — NOREPINEPHRINE 4 MG/250ML-% IV SOLN
INTRAVENOUS | Status: AC
  Filled 2020-12-24: qty 250

## 2020-12-24 MED ORDER — LACTATED RINGERS IV BOLUS
1000.0000 mL | Freq: Once | INTRAVENOUS | Status: AC
  Administered 2020-12-24: 1000 mL via INTRAVENOUS

## 2020-12-24 MED ORDER — METOPROLOL TARTRATE 5 MG/5ML IV SOLN
INTRAVENOUS | Status: AC
  Administered 2020-12-24: 5 mg
  Filled 2020-12-24: qty 5

## 2020-12-24 MED ORDER — AMIODARONE HCL IN DEXTROSE 360-4.14 MG/200ML-% IV SOLN
30.0000 mg/h | INTRAVENOUS | Status: DC
  Administered 2020-12-25 – 2020-12-27 (×5): 30 mg/h via INTRAVENOUS
  Filled 2020-12-24 (×6): qty 200

## 2020-12-24 MED ORDER — SODIUM CHLORIDE 0.9 % IV SOLN
250.0000 mL | INTRAVENOUS | Status: DC
  Administered 2020-12-25 (×2): 250 mL via INTRAVENOUS

## 2020-12-24 MED ORDER — DILTIAZEM HCL 25 MG/5ML IV SOLN: 10.0000 mg | Freq: Once | INTRAVENOUS | Status: AC

## 2020-12-24 MED ORDER — HYDROMORPHONE HCL 1 MG/ML IJ SOLN
INTRAMUSCULAR | Status: AC
  Filled 2020-12-24: qty 1

## 2020-12-24 MED ORDER — SODIUM CHLORIDE 0.9 % IV SOLN
2.0000 g | Freq: Once | INTRAVENOUS | Status: AC
  Administered 2020-12-24: 2 g via INTRAVENOUS
  Filled 2020-12-24: qty 20

## 2020-12-24 MED ORDER — AMIODARONE LOAD VIA INFUSION
250.0000 mg | Freq: Once | INTRAVENOUS | Status: DC
  Filled 2020-12-24: qty 138.89

## 2020-12-24 MED ORDER — SODIUM CHLORIDE 0.9 % IV BOLUS
2000.0000 mL | Freq: Once | INTRAVENOUS | Status: AC
  Administered 2020-12-24: 2000 mL via INTRAVENOUS

## 2020-12-24 MED ORDER — DILTIAZEM HCL 25 MG/5ML IV SOLN
INTRAVENOUS | Status: AC
  Administered 2020-12-24: 10 mg via INTRAVENOUS
  Filled 2020-12-24: qty 5

## 2020-12-24 MED ORDER — DILTIAZEM HCL-DEXTROSE 125-5 MG/125ML-% IV SOLN (PREMIX)
5.0000 mg/h | INTRAVENOUS | Status: DC
  Filled 2020-12-24: qty 125

## 2020-12-24 MED ORDER — DILTIAZEM HCL-DEXTROSE 125-5 MG/125ML-% IV SOLN (PREMIX): 5.0000 mg/h | INTRAVENOUS | Status: DC

## 2020-12-24 MED ORDER — AMIODARONE HCL 150 MG/3ML IV SOLN
250.0000 mg | Freq: Once | INTRAVENOUS | Status: DC
  Administered 2020-12-24: 250 mg via INTRAVENOUS
  Filled 2020-12-24: qty 6

## 2020-12-24 MED ORDER — NOREPINEPHRINE 4 MG/250ML-% IV SOLN
2.0000 ug/min | INTRAVENOUS | Status: DC
  Administered 2020-12-24: 10 ug/min via INTRAVENOUS
  Administered 2020-12-25: 4 ug/min via INTRAVENOUS
  Filled 2020-12-24: qty 250

## 2020-12-24 MED ORDER — HYDRALAZINE HCL 20 MG/ML IJ SOLN
10.0000 mg | Freq: Once | INTRAMUSCULAR | Status: DC
  Filled 2020-12-24: qty 1

## 2020-12-24 MED ORDER — DILTIAZEM HCL 50 MG/10ML IV SOLN: 10.0000 mg | Freq: Once | INTRAVENOUS | Status: DC

## 2020-12-24 MED ORDER — NOREPINEPHRINE 4 MG/250ML-% IV SOLN
0.0000 ug/min | INTRAVENOUS | Status: DC
  Administered 2020-12-24: 2 ug/min via INTRAVENOUS

## 2020-12-24 MED ORDER — HYDRALAZINE HCL 20 MG/ML IJ SOLN: 5.0000 mg | Freq: Once | INTRAMUSCULAR | Status: DC

## 2020-12-24 MED ORDER — HYDROMORPHONE HCL 1 MG/ML IJ SOLN
0.5000 mg | Freq: Once | INTRAMUSCULAR | Status: AC
  Administered 2020-12-24: 0.5 mg via INTRAVENOUS

## 2020-12-24 MED ORDER — AMIODARONE HCL IN DEXTROSE 360-4.14 MG/200ML-% IV SOLN
60.0000 mg/h | INTRAVENOUS | Status: DC
  Administered 2020-12-24: 60 mg/h via INTRAVENOUS

## 2020-12-24 NOTE — ED Notes (Signed)
Per Dr Roderic Palau, pt okay to transfer to [facility] on [address]. Pt to be placed on bipap upon arrival to [facility] ED.

## 2020-12-24 NOTE — ED Notes (Signed)
Date and time results received: 12/24/20 2002  Test: Troponin Critical Value: 256  Name of Provider Notified: Roderic Palau, MD  Orders Received? Or Actions Taken?: acknowledged

## 2020-12-24 NOTE — ED Provider Notes (Signed)
Patient will be put on a nonrebreather for transportation to [place] long she will be taken off the BiPAP that she was given for comfort   Milton Ferguson, MD 12/24/20 2245

## 2020-12-24 NOTE — ED Provider Notes (Addendum)
[facility] EMERGENCY DEPARTMENT Provider Note   CSN: [id number] Arrival date & time: 12/24/20  1541     History Chief Complaint  Patient presents with  . Abdominal Pain  . Chest Pain    [name] [name] is a 80 y.o. female.  Patient presents to the emergency department with pain in her abdomen and flank since Tuesday a couple days ago.  She also has had some left-sided chest pain.  Patient has history of diabetes and hypertension.  The history is provided by the patient and medical records. No language interpreter was used.  Abdominal Pain Pain location:  RUQ and R flank Pain quality: aching   Pain radiates to:  Does not radiate Pain severity:  Moderate Onset quality:  Sudden Timing:  Intermittent Progression:  Worsening Chronicity:  New Associated symptoms: chest pain   Associated symptoms: no cough, no diarrhea, no fatigue and no hematuria   Chest Pain Associated symptoms: abdominal pain   Associated symptoms: no back pain, no cough, no fatigue and no headache        Past Medical History:  Diagnosis Date  . Anemia   . Arthritis    Ostearthritis- hips, knees, fingers  . Diabetes mellitus without complication ([place])   . DVT (deep venous thrombosis) ([place])   . Dyspnea   . GERD (gastroesophageal reflux disease)   . Headache(784.0)    tx. Valproic acid  . Hypertension   . Hypothyroidism   . Pulmonary embolism [facility])     Patient Active Problem List   Diagnosis Date Noted  . Educated about COVID-19 virus infection 02/02/2020  . Iron deficiency anemia due to chronic blood loss   . Occult blood in stools   . Primary osteoarthritis of both knees 05/14/2019  . Unprovoked DVT and Unprovoked Pulmonary Embolism -Dxed 03/2019 04/03/2019  . Sciatica of right side 10/18/2018  . Seborrheic keratosis 10/17/2016  . Hyperlipidemia associated with type 2 diabetes mellitus ([place]) 10/17/2016  . Overactive bladder 10/17/2016  . Gastroesophageal reflux disease without  esophagitis 10/17/2016  . Primary osteoarthritis involving multiple joints 10/17/2016  . Chronic gout without tophus 10/17/2016  . Type 2 diabetes mellitus without complication, without long-term current use of insulin ([place]) 05/16/2016  . Essential hypertension 05/16/2016  . Hypothyroidism 05/16/2016  . Morbid obesity ([place]) 10/30/2013  . S/P right THA, AA 10/29/2013    Past Surgical History:  Procedure Laterality Date  . ABDOMINAL HYSTERECTOMY    . BIOPSY  12/08/2019   Procedure: BIOPSY;  Surgeon: Daneil Dolin, MD;  Location: [facility] ENDO SUITE;  Service: Endoscopy;;  . CATARACT EXTRACTION, BILATERAL    . COLONOSCOPY N/A 12/08/2019   polyps (tubular adenoma), diverticulosis, colonic lipoma, no surveillance due to age  . 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  [name] N/A 01/13/2020   Procedure: GIVENS CAPSULE STUDY;  Surgeon: Daneil Dolin, MD;  Location: [facility] ENDO SUITE;  Service: Endoscopy;  Laterality: N/A;  7:30am  . MULTIPLE TOOTH EXTRACTIONS     60's  . PARATHYROIDECTOMY    . POLYPECTOMY  12/08/2019   Procedure: POLYPECTOMY;  Surgeon: Daneil Dolin, MD;  Location: [facility] ENDO SUITE;  Service: Endoscopy;;  . TOTAL HIP ARTHROPLASTY Right 10/29/2013   Procedure: RIGHT TOTAL HIP ARTHROPLASTY ANTERIOR APPROACH;  Surgeon: Mauri Pole, MD;  Location: [facility] ORS;  Service: Orthopedics;  Laterality: Right;     OB History   No obstetric history on file.  Family History  Problem Relation Age of Onset  . Colitis Sister 75       alive  . Cancer Sister 67       colon  . Cancer Mother 52       uterine  . Heart disease Father 28       heart failure  . Heart attack Brother 56  . Healthy Daughter   . Healthy Son   . Pulmonary embolism Brother 60  . Heart disease Brother   . Healthy Son   . Healthy Son     Social History   Tobacco Use  . Smoking status: Never Smoker   . Smokeless tobacco: Never Used  Vaping Use  . Vaping Use: Never used  Substance Use Topics  . Alcohol use: No    Alcohol/week: 0.0 standard drinks  . Drug use: No    Home Medications Prior to Admission medications   Medication Sig Start Date End Date Taking? Authorizing Provider  acetaminophen (TYLENOL) 325 MG tablet Take 2 tablets (650 mg total) by mouth every 6 (six) hours as needed for mild pain or headache (or Fever >/= 101). 04/04/19  Yes Emokpae, Courage, MD  allopurinol (ZYLOPRIM) 100 MG tablet TAKE 1 TABLET BY MOUTH  DAILY 10/22/20  Yes [name] F, FNP  amLODipine (NORVASC) 10 MG tablet TAKE 1 TABLET BY MOUTH  DAILY 11/16/20  Yes Claretta Fraise, MD  apixaban (ELIQUIS) 2.5 MG TABS tablet Take 1 tablet (2.5 mg total) by mouth 2 (two) times daily. 02/05/20  Yes Minus Breeding, MD  Cholecalciferol (VITAMIN D-3) 5000 UNITS TABS Take 5,000 Units by mouth daily.   Yes [provider]  diclofenac Sodium (VOLTAREN) 1 % GEL Apply 4 g topically 4 (four) times daily. 12/15/20  Yes Loman Brooklyn, FNP  EUTHYROX 50 MCG tablet TAKE 1 TABLET BY MOUTH ONCE DAILY BEFORE BREAKFAST 09/14/20  Yes Claretta Fraise, MD  ferrous sulfate 325 (65 FE) MG tablet Take 325 mg by mouth 2 (two) times daily.   Yes [provider]  furosemide (LASIX) 20 MG tablet TAKE 1 TABLET BY MOUTH  TWICE DAILY 10/22/20  Yes [name] F, FNP  loratadine (CLARITIN) 10 MG tablet Take 10 mg by mouth daily.   Yes [provider]  oxybutynin (DITROPAN-XL) 10 MG 24 hr tablet Take 1 tablet (10 mg total) by mouth at bedtime. 11/23/20  Yes Loman Brooklyn, FNP  pantoprazole (PROTONIX) 40 MG tablet Take 1 tablet (40 mg total) by mouth 2 (two) times daily before a meal. 11/16/20  Yes [name] F, FNP  simvastatin (ZOCOR) 40 MG tablet TAKE 1 TABLET BY MOUTH IN  THE EVENING 08/05/20  Yes Ivy Lynn, NP  valsartan (DIOVAN) 80 MG tablet Take 1 tablet (80 mg total) by mouth 2 (two) times daily. 06/24/20   Yes Minus Breeding, MD  ONE TOUCH ULTRA TEST test strip USE UP TO FOUR TIMES DAILY AS DIRECTED 10/04/17   Terald Sleeper, PA-C    Allergies    Patient has no known allergies.  Review of Systems   Review of Systems  Constitutional: Negative for appetite change and fatigue.  HENT: Negative for congestion, ear discharge and sinus pressure.   Eyes: Negative for discharge.  Respiratory: Negative for cough.   Cardiovascular: Positive for chest pain.  Gastrointestinal: Positive for abdominal pain. Negative for diarrhea.  Genitourinary: Negative for frequency and hematuria.  Musculoskeletal: Negative for back pain.  Skin: Negative for rash.  Neurological: Negative for  seizures and headaches.  Psychiatric/Behavioral: Negative for hallucinations.    Physical Exam Updated Vital Signs BP 95/72   Pulse (!) 125   Temp 99.4 F (37.4 C) (Oral)   Resp (!) 28   Ht 5\' 3"  (1.6 m)   Wt 94.8 kg   SpO2 98%   BMI 37.02 kg/m   Physical Exam Vitals and nursing note reviewed.  Constitutional:      Appearance: She is well-developed.  HENT:     Head: Normocephalic.     Nose: Nose normal.  Eyes:     General: No scleral icterus.    Conjunctiva/sclera: Conjunctivae normal.  Neck:     Thyroid: No thyromegaly.  Cardiovascular:     Rate and Rhythm: Normal rate and regular rhythm.     Heart sounds: No murmur heard. No friction rub. No gallop.   Pulmonary:     Breath sounds: No stridor. No wheezing or rales.  Chest:     Chest wall: No tenderness.  Abdominal:     General: There is no distension.     Tenderness: There is abdominal tenderness. There is no rebound.  Musculoskeletal:        General: Normal range of motion.     Cervical back: Neck supple.  Lymphadenopathy:     Cervical: No cervical adenopathy.  Skin:    Findings: No erythema or rash.  Neurological:     Mental Status: She is alert and oriented to person, place, and time.     Motor: No abnormal muscle tone.     Coordination:  Coordination normal.  Psychiatric:        Behavior: Behavior normal.     ED Results / Procedures / Treatments   Labs (all labs ordered are listed, but only abnormal results are displayed) Labs Reviewed  BASIC METABOLIC PANEL - Abnormal; Notable for the following components:      Result Value   Sodium 133 (*)    CO2 20 (*)    Glucose, Bld 109 (*)    BUN 68 (*)    Creatinine, Ser 4.26 (*)    GFR, Estimated 10 (*)    All other components within normal limits  CBC - Abnormal; Notable for the following components:   WBC 15.6 (*)    Platelets 61 (*)    All other components within normal limits  HEPATIC FUNCTION PANEL - Abnormal; Notable for the following components:   Total Protein 6.4 (*)    Albumin 3.4 (*)    Bilirubin, Direct 0.3 (*)    All other components within normal limits  LACTIC ACID, PLASMA - Abnormal; Notable for the following components:   Lactic Acid, Venous 3.4 (*)    All other components within normal limits  PROTIME-INR - Abnormal; Notable for the following components:   Prothrombin Time 24.7 (*)    INR 2.2 (*)    All other components within normal limits  APTT - Abnormal; Notable for the following components:   aPTT 42 (*)    All other components within normal limits  BLOOD GAS, ARTERIAL - Abnormal; Notable for the following components:   pH, Arterial 7.261 (*)    pCO2 arterial 29.4 (*)    pO2, Arterial 160 (*)    Bicarbonate 14.6 (*)    Acid-base deficit 12.9 (*)    All other components within normal limits  TROPONIN I (HIGH SENSITIVITY) - Abnormal; Notable for the following components:   Troponin I (High Sensitivity) 280 (*)    All other  components within normal limits  TROPONIN I (HIGH SENSITIVITY) - Abnormal; Notable for the following components:   Troponin I (High Sensitivity) 256 (*)    All other components within normal limits  RESP PANEL BY RT-PCR (FLU A&B, COVID) ARPGX2  CULTURE, BLOOD (SINGLE)  URINE CULTURE  LIPASE, BLOOD  LACTIC ACID,  PLASMA  URINALYSIS, ROUTINE W REFLEX MICROSCOPIC    EKG EKG Interpretation  Date/Time:  Thursday December 24 2020 16:48:10 EDT Ventricular Rate:  90 PR Interval:  154 QRS Duration: 102 QT Interval:  376 QTC Calculation: 459 R Axis:   -16 Text Interpretation: Normal sinus rhythm with sinus arrhythmia Cannot rule out Anterior infarct , age undetermined Abnormal ECG Confirmed by Milton Ferguson [phone]) on 12/24/2020 6:16:53 PM   Radiology CT ABDOMEN PELVIS WO CONTRAST  Result Date: 12/24/2020 CLINICAL DATA:  Abdomen pain with fever EXAM: CT ABDOMEN AND PELVIS WITHOUT CONTRAST TECHNIQUE: Multidetector CT imaging of the abdomen and pelvis was performed following the standard protocol without IV contrast. COMPARISON:  CT 01/22/2020 FINDINGS: Lower chest: Lung bases demonstrate streaky atelectasis. Cardiomegaly. Hepatobiliary: Slightly distended gallbladder. No calcified stone. No focal hepatic abnormality or biliary dilatation. Pancreas: Unremarkable. No pancreatic ductal dilatation or surrounding inflammatory changes. Spleen: Normal in size without focal abnormality. Adrenals/Urinary Tract: Adrenal glands are normal. Multiple low-density lesions in the kidneys consistent with cysts. Moderate right perinephric fat stranding. Mild right hydronephrosis, secondary to a 7 x 10 mm stone in the proximal right ureter just past the UPJ. Bladder is unremarkable. There are bilateral intrarenal stones. Small cortical hyperdensity within the lower pole left kidney too small to further characterize. Stomach/Bowel: The stomach is nonenlarged. No dilated small bowel. Negative appendix. Diverticular disease of the sigmoid colon. Vascular/Lymphatic: Moderate aortic atherosclerosis. No aneurysm. No suspicious nodes. Reproductive: Status post hysterectomy. No adnexal masses. Other: Negative for free air. Trace fluid within the bilateral colic gutters. Right retroperitoneal edema and small fluid presumably due to obstructive  kidney disease. Musculoskeletal: Bilateral hip replacements. Grade 1 anterolisthesis L3 on L4 and L4 on L5. Diffuse degenerative changes. No acute osseous abnormality IMPRESSION: 1. Mild right hydronephrosis, secondary to a 7 x 10 mm stone in the proximal right ureter just distal to the UPJ. 2. There are intrarenal stones bilaterally 3. There is gallbladder distension without inflammatory change or calcified stone. Electronically Signed   By: Donavan Foil M.D.   On: 12/24/2020 19:49   DG Chest 2 View  Result Date: 12/24/2020 CLINICAL DATA:  Shortness of breath, chest pain EXAM: CHEST - 2 VIEW COMPARISON:  04/03/2019 FINDINGS: Cardiomegaly. Aortic atherosclerosis. No confluent opacities or effusions. No acute bony abnormality. IMPRESSION: Mild cardiomegaly.  No active disease. Electronically Signed   By: Rolm Baptise M.D.   On: 12/24/2020 17:10   DG Chest Port 1 View  Result Date: 12/24/2020 CLINICAL DATA:  Shortness of breath and chest pain. EXAM: PORTABLE CHEST 1 VIEW COMPARISON:  Radiograph earlier today. Lung bases from abdominal CT earlier today. FINDINGS: Lung volumes are low. Similar cardiomegaly increasing bibasilar atelectasis. No pulmonary edema, large pleural effusion, or pneumothorax. Stable osseous structures. IMPRESSION: Low lung volumes with increasing bibasilar atelectasis from earlier today. Electronically Signed   By: Keith Rake M.D.   On: 12/24/2020 20:57    Procedures Procedures   Medications Ordered in ED Medications  HYDROmorphone (DILAUDID) 1 MG/ML injection (  Not Given 12/24/20 2026)  diltiazem (CARDIZEM) injection SOLN 10 mg (10 mg Intravenous Not Given 12/24/20 2055)  diltiazem (CARDIZEM) 125 mg in dextrose 5%  125 mL (1 mg/mL) infusion (5 mg/hr Intravenous Not Given 12/24/20 2102)  hydrALAZINE (APRESOLINE) injection 5 mg (0 mg Intravenous Hold 12/24/20 2102)  amiodarone (NEXTERONE) 1.8 mg/mL load via infusion 250 mg (250 mg Intravenous Not Given 12/24/20 2151)  amiodarone  (NEXTERONE PREMIX) 360-4.14 MG/200ML-% (1.8 mg/mL) IV infusion (60 mg/hr Intravenous New Bag/Given 12/24/20 2155)    Followed by  amiodarone (NEXTERONE PREMIX) 360-4.14 MG/200ML-% (1.8 mg/mL) IV infusion (has no administration in time range)  sodium chloride 0.9 % bolus 2,000 mL (0 mLs Intravenous Stopped 12/24/20 2014)  cefTRIAXone (ROCEPHIN) 2 g in sodium chloride 0.9 % 100 mL IVPB (0 g Intravenous Stopped 12/24/20 2025)  HYDROmorphone (DILAUDID) injection 0.5 mg (0.5 mg Intravenous Given 12/24/20 2023)  metoprolol tartrate (LOPRESSOR) 5 MG/5ML injection (5 mg  Given 12/24/20 2047)  diltiazem (CARDIZEM) injection 10 mg (10 mg Intravenous Given 12/24/20 2055)  sodium chloride 0.9 % bolus 500 mL (0 mLs Intravenous Stopped 12/24/20 2136)  lactated ringers bolus 1,000 mL (1,000 mLs Intravenous New Bag/Given 12/24/20 2137)    ED Course  I have reviewed the triage vital signs and the nursing notes.  Pertinent labs & imaging results that were available during my care of the patient were reviewed by me and considered in my medical decision making (see chart for details). CRITICAL CARE Performed by: Milton Ferguson Total critical care time: 76 minutes Critical care time was exclusive of separately billable procedures and treating other patients. Critical care was necessary to treat or prevent imminent or life-threatening deterioration. Critical care was time spent personally by me on the following activities: development of treatment plan with patient and/or surrogate as well as nursing, discussions with consultants, evaluation of patient's response to treatment, examination of patient, obtaining history from patient or surrogate, ordering and performing treatments and interventions, ordering and review of laboratory studies, ordering and review of radiographic studies, pulse oximetry and re-evaluation of patient's condition. Patient is septic from a kidney stone.  I spoke with urology Dr. Abner Greenspan and he is thinking about  putting a stent or urostomy tube and the patient.  Prior to being transferred the patient went into rapid atrial fibs and was very hypertensive.  She was given 5 of Lopressor and 10 of Cardizem and then became very hypotensive.  Patient has received 2 and half liters of saline and 1 L of lactated Ringer's.  Her blood pressure became 185 systolic.  And her heart rate slowed back down to about 100.  The patient was moderately short of breath with a rapid A. fib and was eventually placed on BiPAP.  The patient felt much better but we were unable to get reasonable O2 sat so she had ABG done that showed her O2 sat was 100  on100% O2.  This was adjusted.  Patient then started to get rapid A. fib again.  I spoke with cardiology and it was recommended that the patient be started on an amnio amiodarone drip.  Patient will be transferred to the [facility] long emergency department Dr. Eulis Foster is excepting,   patient was given amiodarone to slow her heart rate down.  Once again after getting this medicine her blood pressure dropped similar to when she got the Lopressor and the Cardizem.  The amiodarone will be stopped and the patient will be started on a small dose of norepinephrine.  Patient is clinically doing fine she is having no chest pain no shortness of breath she is oriented normally.   MDM Rules/Calculators/A&P    Patient with  sepsis from a ureteral stone and new onset atrial fibrillation with hypertension and then hypotension.  She is being transferred directly to the emergency department at [facility] with Dr. Eulis Foster excepting and Dr. Edwinna Areola critical care agreeing with this treatment.  Dr. Abner Greenspan urology will be notified                     Final Clinical Impression(s) / ED Diagnoses Final diagnoses:  None    Rx / DC Orders ED Discharge Orders    None       Milton Ferguson, MD 12/24/20 2206    Milton Ferguson, MD 12/24/20 2225    Milton Ferguson, MD 12/24/20 2243

## 2020-12-24 NOTE — ED Notes (Signed)
Patient BIB [facility] from [facility]. Right sided chest pain, fever Tuesday. Elevated lactic 3.4, troponin 280. CT of belly. 7x2mm stone right ureter. 2g Rocephin. Plan to have emergent stent placement. HR 130s at 8pm. 830pm, patient HTN, guppy breathing, tachypnea. BP then dropped. Levo drip started. Patient on nonrebreather at 15L/min upon arrival.

## 2020-12-24 NOTE — Progress Notes (Signed)
Consulted regarding right proximal ureteral stone 65mm with mild right hydronephrosis and AKI with creatinine of 4.26 from baseline of 0.8-1.1.  She was also found to have an elevated troponin of 256 with no suspicious finding on EKG per report.  Her lactic acid is elevated 3.4.  She is found to be afebrile with a leukocytosis of 15.6.  I recommended right ureteral stent placement when appropriate.  I discussed with on-call anesthesiologist who does not think this case is appropriate to be done at [facility] overnight given elevated troponin.  As such, he recommended transfer to [facility] or [facility].  Recommend transfer to hospitalist at [facility] for further workup.  We will proceed with right ureteral stent placement either tonight or tomorrow after cleared by anesthesia standpoint given elevated troponin.  Full consult to follow.  Matt R. [facility]  Pager: [phone]

## 2020-12-24 NOTE — ED Notes (Signed)
Re paged Dr Abner Greenspan to Dr Roderic Palau

## 2020-12-24 NOTE — ED Triage Notes (Signed)
Pt reports chest pain, sob, and pain in R side since Tuesday.  Reports has had n/v/d as well.  Reports fever Tuesday, none since then.  Denies any pain or burning with urination.

## 2020-12-24 NOTE — ED Notes (Signed)
Cardiology paged to  Dr Roderic Palau

## 2020-12-24 NOTE — ED Notes (Signed)
Date and time results received: 12/24/20 1815 (use smartphrase ".now" to insert current time)  Test: Trop Critical Value: 280  Name of Provider Notified: Dr. Roderic Palau  Orders Received? Or Actions Taken?: na

## 2020-12-24 NOTE — ED Notes (Signed)
ABG drawn at this time.

## 2020-12-24 NOTE — ED Notes (Signed)
Critical care called.

## 2020-12-24 NOTE — ED Notes (Signed)
Date and time results received: 12/24/20 1851 (use smartphrase ".now" to insert current time)  Test: lactic acid Critical Value: 3.4  Name of Provider Notified: Dr. Roderic Palau  Orders Received? Or Actions Taken?: na

## 2020-12-24 NOTE — Progress Notes (Signed)
RT note on patient   BIPAP settings:  IPAP: 14 EPAP: 6  FIO2: 40% Rate: 10 I:time: 0.9  Patient output  VT:547 Rate:20 Minute volume:14 PIP: 15 Leak: 0

## 2020-12-25 ENCOUNTER — Inpatient Hospital Stay (HOSPITAL_COMMUNITY): Payer: Medicare Other

## 2020-12-25 ENCOUNTER — Encounter (HOSPITAL_COMMUNITY)
Admission: EM | Disposition: A | Discharge status: Home Health | Payer: Self-pay | Source: Home / Self Care | Attending: Pulmonary Disease

## 2020-12-25 ENCOUNTER — Inpatient Hospital Stay (HOSPITAL_COMMUNITY): Payer: Medicare Other | Admitting: Certified Registered"

## 2020-12-25 DIAGNOSIS — E785 Hyperlipidemia, unspecified: Secondary | ICD-10-CM | POA: Diagnosis present

## 2020-12-25 DIAGNOSIS — Z86718 Personal history of other venous thrombosis and embolism: Secondary | ICD-10-CM | POA: Diagnosis not present

## 2020-12-25 DIAGNOSIS — I10 Essential (primary) hypertension: Secondary | ICD-10-CM | POA: Diagnosis not present

## 2020-12-25 DIAGNOSIS — A4151 Sepsis due to Escherichia coli [E. coli]: Secondary | ICD-10-CM | POA: Diagnosis not present

## 2020-12-25 DIAGNOSIS — Z6839 Body mass index (BMI) 39.0-39.9, adult: Secondary | ICD-10-CM | POA: Diagnosis not present

## 2020-12-25 DIAGNOSIS — M109 Gout, unspecified: Secondary | ICD-10-CM | POA: Diagnosis present

## 2020-12-25 DIAGNOSIS — D649 Anemia, unspecified: Secondary | ICD-10-CM | POA: Diagnosis not present

## 2020-12-25 DIAGNOSIS — N10 Acute tubulo-interstitial nephritis: Secondary | ICD-10-CM | POA: Diagnosis not present

## 2020-12-25 DIAGNOSIS — N200 Calculus of kidney: Secondary | ICD-10-CM | POA: Diagnosis not present

## 2020-12-25 DIAGNOSIS — R6521 Severe sepsis with septic shock: Secondary | ICD-10-CM | POA: Diagnosis present

## 2020-12-25 DIAGNOSIS — Z8249 Family history of ischemic heart disease and other diseases of the circulatory system: Secondary | ICD-10-CM | POA: Diagnosis not present

## 2020-12-25 DIAGNOSIS — I248 Other forms of acute ischemic heart disease: Secondary | ICD-10-CM | POA: Diagnosis not present

## 2020-12-25 DIAGNOSIS — E119 Type 2 diabetes mellitus without complications: Secondary | ICD-10-CM | POA: Diagnosis not present

## 2020-12-25 DIAGNOSIS — N132 Hydronephrosis with renal and ureteral calculous obstruction: Secondary | ICD-10-CM | POA: Diagnosis not present

## 2020-12-25 DIAGNOSIS — Z86711 Personal history of pulmonary embolism: Secondary | ICD-10-CM | POA: Diagnosis not present

## 2020-12-25 DIAGNOSIS — J9811 Atelectasis: Secondary | ICD-10-CM | POA: Diagnosis present

## 2020-12-25 DIAGNOSIS — E872 Acidosis: Secondary | ICD-10-CM

## 2020-12-25 DIAGNOSIS — E039 Hypothyroidism, unspecified: Secondary | ICD-10-CM | POA: Diagnosis present

## 2020-12-25 DIAGNOSIS — D696 Thrombocytopenia, unspecified: Secondary | ICD-10-CM | POA: Diagnosis not present

## 2020-12-25 DIAGNOSIS — E876 Hypokalemia: Secondary | ICD-10-CM | POA: Diagnosis present

## 2020-12-25 DIAGNOSIS — J9601 Acute respiratory failure with hypoxia: Secondary | ICD-10-CM

## 2020-12-25 DIAGNOSIS — N179 Acute kidney failure, unspecified: Secondary | ICD-10-CM

## 2020-12-25 DIAGNOSIS — N390 Urinary tract infection, site not specified: Secondary | ICD-10-CM | POA: Diagnosis not present

## 2020-12-25 DIAGNOSIS — A419 Sepsis, unspecified organism: Secondary | ICD-10-CM | POA: Diagnosis present

## 2020-12-25 DIAGNOSIS — E861 Hypovolemia: Secondary | ICD-10-CM | POA: Diagnosis not present

## 2020-12-25 DIAGNOSIS — N3090 Cystitis, unspecified without hematuria: Secondary | ICD-10-CM | POA: Diagnosis not present

## 2020-12-25 DIAGNOSIS — N136 Pyonephrosis: Secondary | ICD-10-CM | POA: Diagnosis present

## 2020-12-25 DIAGNOSIS — K219 Gastro-esophageal reflux disease without esophagitis: Secondary | ICD-10-CM | POA: Diagnosis present

## 2020-12-25 DIAGNOSIS — Z20822 Contact with and (suspected) exposure to covid-19: Secondary | ICD-10-CM | POA: Diagnosis not present

## 2020-12-25 DIAGNOSIS — R7881 Bacteremia: Secondary | ICD-10-CM | POA: Diagnosis not present

## 2020-12-25 DIAGNOSIS — I4891 Unspecified atrial fibrillation: Secondary | ICD-10-CM | POA: Diagnosis not present

## 2020-12-25 HISTORY — PX: CYSTOSCOPY W/ URETERAL STENT PLACEMENT: SHX1429

## 2020-12-25 LAB — BLOOD CULTURE ID PANEL (REFLEXED) - BCID2

## 2020-12-25 LAB — BASIC METABOLIC PANEL
Anion gap: 13 (ref 5–15)
Anion gap: 12 (ref 5–15)
Anion gap: 12 (ref 5–15)
BUN: 65 mg/dL — ABNORMAL HIGH (ref 8–23)
BUN: 73 mg/dL — ABNORMAL HIGH (ref 8–23)
BUN: 75 mg/dL — ABNORMAL HIGH (ref 8–23)
CO2: 14 mmol/L — ABNORMAL LOW (ref 22–32)
CO2: 20 mmol/L — ABNORMAL LOW (ref 22–32)
CO2: 22 mmol/L (ref 22–32)
Calcium: 8.7 mg/dL — ABNORMAL LOW (ref 8.9–10.3)
Calcium: 8.4 mg/dL — ABNORMAL LOW (ref 8.9–10.3)
Calcium: 8.4 mg/dL — ABNORMAL LOW (ref 8.9–10.3)
Chloride: 106 mmol/L (ref 98–111)
Chloride: 102 mmol/L (ref 98–111)
Chloride: 101 mmol/L (ref 98–111)
Creatinine, Ser: 3.88 mg/dL — ABNORMAL HIGH (ref 0.44–1.00)
Creatinine, Ser: 4.19 mg/dL — ABNORMAL HIGH (ref 0.44–1.00)
Creatinine, Ser: 3.83 mg/dL — ABNORMAL HIGH (ref 0.44–1.00)
GFR, Estimated: 11 mL/min — ABNORMAL LOW (ref >60)
GFR, Estimated: 10 mL/min — ABNORMAL LOW (ref >60)
GFR, Estimated: 11 mL/min — ABNORMAL LOW (ref >60)
Glucose, Bld: 128 mg/dL — ABNORMAL HIGH (ref 70–99)
Glucose, Bld: 109 mg/dL — ABNORMAL HIGH (ref 70–99)
Glucose, Bld: 120 mg/dL — ABNORMAL HIGH (ref 70–99)
Potassium: 4.3 mmol/L (ref 3.5–5.1)
Potassium: 3.3 mmol/L — ABNORMAL LOW (ref 3.5–5.1)
Potassium: 3 mmol/L — ABNORMAL LOW (ref 3.5–5.1)
Sodium: 133 mmol/L — ABNORMAL LOW (ref 135–145)
Sodium: 134 mmol/L — ABNORMAL LOW (ref 135–145)
Sodium: 135 mmol/L (ref 135–145)

## 2020-12-25 LAB — BLOOD GAS, ARTERIAL
Acid-base deficit: 12.4 mmol/L — ABNORMAL HIGH (ref 0.0–2.0)
Bicarbonate: 15.9 mmol/L — ABNORMAL LOW (ref 20.0–28.0)
FIO2: 100
O2 Saturation: 98.3 %
Patient temperature: 37
pCO2 arterial: 47.8 mmHg (ref 32.0–48.0)
pH, Arterial: 7.15 — CL (ref 7.350–7.450)
pO2, Arterial: 149 mmHg — ABNORMAL HIGH (ref 83.0–108.0)

## 2020-12-25 LAB — CBC
HCT: 38.5 % (ref 36.0–46.0)
Hemoglobin: 12.6 g/dL (ref 12.0–15.0)
MCH: 32.1 pg (ref 26.0–34.0)
MCHC: 32.7 g/dL (ref 30.0–36.0)
MCV: 98.2 fL (ref 80.0–100.0)
Platelets: 61 10*3/uL — ABNORMAL LOW (ref 150–400)
RBC: 3.92 MIL/uL (ref 3.87–5.11)
RDW: 14.5 % (ref 11.5–15.5)
WBC: 21 10*3/uL — ABNORMAL HIGH (ref 4.0–10.5)
nRBC: 0 % (ref 0.0–0.2)

## 2020-12-25 LAB — GLUCOSE, CAPILLARY
Glucose-Capillary: 111 mg/dL — ABNORMAL HIGH (ref 70–99)
Glucose-Capillary: 114 mg/dL — ABNORMAL HIGH (ref 70–99)
Glucose-Capillary: 139 mg/dL — ABNORMAL HIGH (ref 70–99)
Glucose-Capillary: 94 mg/dL (ref 70–99)
Glucose-Capillary: 95 mg/dL (ref 70–99)
Glucose-Capillary: 97 mg/dL (ref 70–99)
Glucose-Capillary: 97 mg/dL (ref 70–99)

## 2020-12-25 LAB — LACTIC ACID, PLASMA
Lactic Acid, Venous: 3 mmol/L (ref 0.5–1.9)
Lactic Acid, Venous: 2.8 mmol/L (ref 0.5–1.9)

## 2020-12-25 LAB — PHOSPHORUS: Phosphorus: 4.1 mg/dL (ref 2.5–4.6)

## 2020-12-25 LAB — MAGNESIUM
Magnesium: 1.6 mg/dL — ABNORMAL LOW (ref 1.7–2.4)
Magnesium: 2.1 mg/dL (ref 1.7–2.4)

## 2020-12-25 LAB — MRSA PCR SCREENING: MRSA by PCR: NEGATIVE

## 2020-12-25 LAB — CORTISOL: Cortisol, Plasma: 32.4 ug/dL

## 2020-12-25 SURGERY — CYSTOSCOPY, WITH RETROGRADE PYELOGRAM AND URETERAL STENT INSERTION
Anesthesia: General | Site: Ureter | Laterality: Right

## 2020-12-25 MED ORDER — PROPOFOL 10 MG/ML IV BOLUS
INTRAVENOUS | Status: AC
  Filled 2020-12-25: qty 20

## 2020-12-25 MED ORDER — INSULIN ASPART 100 UNIT/ML IJ SOLN
0.0000 [IU] | INTRAMUSCULAR | Status: DC
  Administered 2020-12-25 – 2020-12-27 (×2): 2 [IU] via SUBCUTANEOUS

## 2020-12-25 MED ORDER — POLYETHYLENE GLYCOL 3350 17 G PO PACK: 17.0000 g | PACK | Freq: Every day | ORAL | Status: DC | PRN

## 2020-12-25 MED ORDER — FENTANYL CITRATE (PF) 100 MCG/2ML IJ SOLN
INTRAMUSCULAR | Status: AC
  Filled 2020-12-25: qty 2

## 2020-12-25 MED ORDER — PHENYLEPHRINE 40 MCG/ML (10ML) SYRINGE FOR IV PUSH (FOR BLOOD PRESSURE SUPPORT)
PREFILLED_SYRINGE | INTRAVENOUS | Status: AC
  Filled 2020-12-25: qty 30

## 2020-12-25 MED ORDER — ORAL CARE MOUTH RINSE
15.0000 mL | Freq: Two times a day (BID) | OROMUCOSAL | Status: DC
  Administered 2020-12-25 – 2020-12-30 (×5): 15 mL via OROMUCOSAL

## 2020-12-25 MED ORDER — APIXABAN 2.5 MG PO TABS: 2.5000 mg | ORAL_TABLET | Freq: Two times a day (BID) | ORAL | Status: DC

## 2020-12-25 MED ORDER — PROPOFOL 1000 MG/100ML IV EMUL
5.0000 ug/kg/min | INTRAVENOUS | Status: DC
Start: 2020-12-25 — End: 2020-12-25
  Administered 2020-12-25: 20 ug/kg/min via INTRAVENOUS

## 2020-12-25 MED ORDER — IOHEXOL 300 MG/ML  SOLN
INTRAMUSCULAR | Status: DC | PRN
  Administered 2020-12-25: 12 mL via URETHRAL

## 2020-12-25 MED ORDER — ROCURONIUM BROMIDE 10 MG/ML (PF) SYRINGE
PREFILLED_SYRINGE | INTRAVENOUS | Status: DC | PRN
  Administered 2020-12-25: 10 mg via INTRAVENOUS
  Administered 2020-12-25: 40 mg via INTRAVENOUS

## 2020-12-25 MED ORDER — PIPERACILLIN-TAZOBACTAM IN DEX 2-0.25 GM/50ML IV SOLN
2.2500 g | INTRAVENOUS | Status: DC
  Filled 2020-12-25: qty 50

## 2020-12-25 MED ORDER — ROCURONIUM BROMIDE 10 MG/ML (PF) SYRINGE
PREFILLED_SYRINGE | INTRAVENOUS | Status: AC
  Filled 2020-12-25: qty 20

## 2020-12-25 MED ORDER — ORAL CARE MOUTH RINSE
15.0000 mL | OROMUCOSAL | Status: DC
  Administered 2020-12-25 (×3): 15 mL via OROMUCOSAL

## 2020-12-25 MED ORDER — DEXAMETHASONE SODIUM PHOSPHATE 10 MG/ML IJ SOLN
INTRAMUSCULAR | Status: AC
  Filled 2020-12-25: qty 1

## 2020-12-25 MED ORDER — SODIUM CHLORIDE 0.9 % IV SOLN
2.0000 g | INTRAVENOUS | Status: AC
  Administered 2020-12-25 – 2020-12-28 (×4): 2 g via INTRAVENOUS
  Filled 2020-12-25 (×4): qty 20

## 2020-12-25 MED ORDER — HEPARIN SODIUM (PORCINE) 5000 UNIT/ML IJ SOLN: 5000.0000 [IU] | Freq: Three times a day (TID) | INTRAMUSCULAR | Status: DC

## 2020-12-25 MED ORDER — LIDOCAINE 2% (20 MG/ML) 5 ML SYRINGE
INTRAMUSCULAR | Status: DC | PRN
  Administered 2020-12-25: 40 mg via INTRAVENOUS

## 2020-12-25 MED ORDER — HYDROMORPHONE HCL 1 MG/ML IJ SOLN: 0.5000 mg | INTRAMUSCULAR | Status: DC | PRN

## 2020-12-25 MED ORDER — SODIUM CHLORIDE 0.9 % IV SOLN
2.0000 g | INTRAVENOUS | Status: DC
  Filled 2020-12-25: qty 20

## 2020-12-25 MED ORDER — NOREPINEPHRINE 4 MG/250ML-% IV SOLN
INTRAVENOUS | Status: DC | PRN
  Administered 2020-12-25: 10 ug/min via INTRAVENOUS

## 2020-12-25 MED ORDER — STERILE WATER FOR IRRIGATION IR SOLN
Status: DC | PRN
  Administered 2020-12-25: 1000 mL

## 2020-12-25 MED ORDER — ACETAMINOPHEN 10 MG/ML IV SOLN
1000.0000 mg | Freq: Once | INTRAVENOUS | Status: AC
  Administered 2020-12-25: 1000 mg via INTRAVENOUS
  Filled 2020-12-25: qty 100

## 2020-12-25 MED ORDER — SODIUM CHLORIDE 0.9 % IV SOLN: INTRAVENOUS | Status: DC | PRN

## 2020-12-25 MED ORDER — EPHEDRINE 5 MG/ML INJ
INTRAVENOUS | Status: AC
  Filled 2020-12-25: qty 10

## 2020-12-25 MED ORDER — PROPOFOL 1000 MG/100ML IV EMUL
INTRAVENOUS | Status: AC
  Filled 2020-12-25: qty 100

## 2020-12-25 MED ORDER — DEXTROSE 5 % IN LACTATED RINGERS IV BOLUS
1000.0000 mL | Freq: Once | INTRAVENOUS | Status: AC
  Administered 2020-12-25: 1000 mL via INTRAVENOUS

## 2020-12-25 MED ORDER — SODIUM CHLORIDE 0.9 % IR SOLN
Status: DC | PRN
  Administered 2020-12-25: 1000 mL

## 2020-12-25 MED ORDER — PANTOPRAZOLE SODIUM 40 MG PO TBEC: 40.0000 mg | DELAYED_RELEASE_TABLET | Freq: Two times a day (BID) | ORAL | Status: DC

## 2020-12-25 MED ORDER — ALBUMIN HUMAN 5 % IV SOLN
12.5000 g | Freq: Once | INTRAVENOUS | Status: AC
  Administered 2020-12-25: 12.5 g via INTRAVENOUS
  Filled 2020-12-25: qty 250

## 2020-12-25 MED ORDER — APIXABAN 2.5 MG PO TABS
2.5000 mg | ORAL_TABLET | Freq: Two times a day (BID) | ORAL | Status: DC
  Administered 2020-12-25: 2.5 mg
  Filled 2020-12-25: qty 1

## 2020-12-25 MED ORDER — SUCCINYLCHOLINE CHLORIDE 200 MG/10ML IV SOSY
PREFILLED_SYRINGE | INTRAVENOUS | Status: DC | PRN
  Administered 2020-12-25: 200 mg via INTRAVENOUS

## 2020-12-25 MED ORDER — PROPOFOL 500 MG/50ML IV EMUL
INTRAVENOUS | Status: DC | PRN
  Administered 2020-12-25: 50 ug/kg/min via INTRAVENOUS

## 2020-12-25 MED ORDER — DOCUSATE SODIUM 100 MG PO CAPS: 100.0000 mg | ORAL_CAPSULE | Freq: Two times a day (BID) | ORAL | Status: DC | PRN

## 2020-12-25 MED ORDER — POTASSIUM CHLORIDE 10 MEQ/100ML IV SOLN
10.0000 meq | INTRAVENOUS | Status: AC
  Administered 2020-12-25 (×4): 10 meq via INTRAVENOUS
  Filled 2020-12-25 (×4): qty 100

## 2020-12-25 MED ORDER — EPINEPHRINE 1 MG/10ML IJ SOSY
PREFILLED_SYRINGE | INTRAMUSCULAR | Status: AC
  Filled 2020-12-25: qty 10

## 2020-12-25 MED ORDER — ACETAMINOPHEN 10 MG/ML IV SOLN: 1000.0000 mg | Freq: Three times a day (TID) | INTRAVENOUS | Status: DC | PRN

## 2020-12-25 MED ORDER — VASOPRESSIN 20 UNIT/ML IV SOLN
INTRAVENOUS | Status: AC
  Filled 2020-12-25: qty 1

## 2020-12-25 MED ORDER — DOCUSATE SODIUM 50 MG/5ML PO LIQD: 100.0000 mg | Freq: Two times a day (BID) | ORAL | Status: DC | PRN

## 2020-12-25 MED ORDER — SODIUM BICARBONATE 8.4 % IV SOLN
50.0000 meq | Freq: Once | INTRAVENOUS | Status: AC
  Administered 2020-12-25: 50 meq via INTRAVENOUS
  Filled 2020-12-25: qty 50

## 2020-12-25 MED ORDER — SIMVASTATIN 40 MG PO TABS: 40.0000 mg | ORAL_TABLET | Freq: Every evening | ORAL | Status: DC

## 2020-12-25 MED ORDER — ONDANSETRON HCL 4 MG/2ML IJ SOLN
INTRAMUSCULAR | Status: AC
  Filled 2020-12-25: qty 2

## 2020-12-25 MED ORDER — CHLORHEXIDINE GLUCONATE 0.12 % MT SOLN
15.0000 mL | Freq: Two times a day (BID) | OROMUCOSAL | Status: DC
  Administered 2020-12-26 – 2020-12-30 (×9): 15 mL via OROMUCOSAL
  Filled 2020-12-25 (×5): qty 15

## 2020-12-25 MED ORDER — SUCCINYLCHOLINE CHLORIDE 200 MG/10ML IV SOSY
PREFILLED_SYRINGE | INTRAVENOUS | Status: AC
  Filled 2020-12-25: qty 20

## 2020-12-25 MED ORDER — LIDOCAINE 2% (20 MG/ML) 5 ML SYRINGE
INTRAMUSCULAR | Status: AC
  Filled 2020-12-25: qty 10

## 2020-12-25 MED ORDER — MIDAZOLAM HCL 2 MG/2ML IJ SOLN
INTRAMUSCULAR | Status: AC
  Filled 2020-12-25: qty 2

## 2020-12-25 MED ORDER — MAGNESIUM SULFATE 2 GM/50ML IV SOLN
2.0000 g | Freq: Once | INTRAVENOUS | Status: AC
  Administered 2020-12-25: 2 g via INTRAVENOUS
  Filled 2020-12-25: qty 50

## 2020-12-25 MED ORDER — PANTOPRAZOLE SODIUM 40 MG IV SOLR
40.0000 mg | INTRAVENOUS | Status: DC
  Administered 2020-12-25 – 2020-12-26 (×2): 40 mg via INTRAVENOUS
  Filled 2020-12-25 (×2): qty 40

## 2020-12-25 MED ORDER — SODIUM BICARBONATE 8.4 % IV SOLN
INTRAVENOUS | Status: DC
  Filled 2020-12-25: qty 150
  Filled 2020-12-25 (×2): qty 1000

## 2020-12-25 MED ORDER — CHLORHEXIDINE GLUCONATE CLOTH 2 % EX PADS
6.0000 | MEDICATED_PAD | Freq: Every day | CUTANEOUS | Status: DC
  Administered 2020-12-25 – 2020-12-29 (×6): 6 via TOPICAL

## 2020-12-25 MED ORDER — SODIUM CHLORIDE 0.9 % IV SOLN: 2.0000 g | Freq: Once | INTRAVENOUS | Status: DC

## 2020-12-25 MED ORDER — PHENYLEPHRINE HCL (PRESSORS) 10 MG/ML IV SOLN
INTRAVENOUS | Status: AC
  Filled 2020-12-25: qty 2

## 2020-12-25 MED ORDER — CHLORHEXIDINE GLUCONATE 0.12% ORAL RINSE (MEDLINE KIT)
15.0000 mL | Freq: Two times a day (BID) | OROMUCOSAL | Status: DC
  Administered 2020-12-25: 15 mL via OROMUCOSAL

## 2020-12-25 MED ORDER — PANTOPRAZOLE SODIUM 40 MG PO PACK
40.0000 mg | PACK | Freq: Two times a day (BID) | ORAL | Status: DC
  Administered 2020-12-25: 40 mg
  Filled 2020-12-25: qty 20

## 2020-12-25 MED ORDER — ETOMIDATE 2 MG/ML IV SOLN
INTRAVENOUS | Status: DC | PRN
  Administered 2020-12-25: 10 mg via INTRAVENOUS

## 2020-12-25 SURGICAL SUPPLY — 20 items
BAG URO CATCHER STRL LF (MISCELLANEOUS) ×4 IMPLANT
CATH INTERMIT  6FR 70CM (CATHETERS) ×4 IMPLANT
CLOTH BEACON ORANGE TIMEOUT ST (SAFETY) ×4 IMPLANT
GLOVE SURG ENC MOIS LTX SZ7.5 (GLOVE) ×4 IMPLANT
GLOVE SURG ENC MOIS LTX SZ8 (GLOVE) ×4 IMPLANT
GLOVE SURG ENC TEXT LTX SZ7 (GLOVE) ×4 IMPLANT
GLOVE SURG LTX SZ8 (GLOVE) ×4 IMPLANT
GOWN STRL REUS W/ TWL XL LVL3 (GOWN DISPOSABLE) ×2 IMPLANT
GOWN STRL REUS W/TWL LRG LVL3 (GOWN DISPOSABLE) ×4 IMPLANT
GOWN STRL REUS W/TWL XL LVL3 (GOWN DISPOSABLE) ×4
GUIDEWIRE ANG ZIPWIRE 038X150 (WIRE) ×4 IMPLANT
GUIDEWIRE STR DUAL SENSOR (WIRE) ×8 IMPLANT
KIT TURNOVER KIT A (KITS) ×4 IMPLANT
MANIFOLD NEPTUNE II (INSTRUMENTS) ×4 IMPLANT
PACK CYSTO (CUSTOM PROCEDURE TRAY) ×4 IMPLANT
STENT URET 6FRX26 CONTOUR (STENTS) ×4 IMPLANT
TRAY FOLEY MTR SLVR 14FR STAT (SET/KITS/TRAYS/PACK) ×4 IMPLANT
TUBING CONNECTING 10 (TUBING) ×3 IMPLANT
TUBING CONNECTING 10' (TUBING) ×1
TUBING UROLOGY SET (TUBING) ×4 IMPLANT

## 2020-12-25 NOTE — Anesthesia Preprocedure Evaluation (Addendum)
Anesthesia Evaluation  Patient identified by MRN, date of birth, ID band Patient awake    Reviewed: Allergy & Precautions, NPO status , Patient's Chart, lab work & pertinent test results  Airway Mallampati: II  TM Distance: >3 FB Neck ROM: Full    Dental  (+) Edentulous Upper, Edentulous Lower, Dental Advisory Given   Pulmonary shortness of breath, PE   Pulmonary exam normal breath sounds clear to auscultation       Cardiovascular hypertension, Pt. on medications + DVT  Normal cardiovascular exam+ dysrhythmias Atrial Fibrillation  Rhythm:Regular Rate:Normal  TTE 2020 1. The left ventricle has normal systolic function with an ejection  fraction of 60-65%. The cavity size was normal. There is mildly increased  left ventricular wall thickness. Left ventricular diastolic Doppler  parameters are consistent with impaired  relaxation. Elevated mean left atrial pressure.  2. The right ventricle has normal systolic function. The cavity was  normal. There is no increase in right ventricular wall thickness.  3. Left atrial size was severely dilated.  4. Right atrial size was mildly dilated.  5. No evidence of mitral valve stenosis.  6. The aortic valve is tricuspid. No stenosis of the aortic valve.  7. The aorta is normal unless otherwise noted.  8. The aortic root is normal in size and structure.  9. Pulmonary hypertension is indeterminant, inadequate TR jet   Neuro/Psych  Headaches, negative psych ROS   GI/Hepatic Neg liver ROS, GERD  Medicated,  Endo/Other  diabetesHypothyroidism   Renal/GU negative Renal ROS  negative genitourinary   Musculoskeletal  (+) Arthritis ,   Abdominal   Peds  Hematology  (+) Blood dyscrasia (on eliquis), ,   Anesthesia Other Findings Presented to [facility] with right sided abdominal pain found to have obstructing ureteral stone. Initially hypertensive and given lopressor and  diltiazem. Became hypotensive and started on levophed gtt. Developed afib with RVR with rate 130s. Started on amio gtt. Arrives to preop on amio gtt and levophed at 61mcg/min. BP 338S systolic and HR 505L.   Reproductive/Obstetrics                            Anesthesia Physical Anesthesia Plan  ASA: IV and emergent  Anesthesia Plan: General   Post-op Pain Management:    Induction: Intravenous and Rapid sequence  PONV Risk Score and Plan: 3 and Treatment may vary due to age or medical condition  Airway Management Planned: Oral ETT  Additional Equipment: Arterial line  Intra-op Plan:   Post-operative Plan: Extubation in OR and Possible Post-op intubation/ventilation  Informed Consent: I have reviewed the patients History and Physical, chart, labs and discussed the procedure including the risks, benefits and alternatives for the proposed anesthesia with the patient or authorized representative who has indicated his/her understanding and acceptance.     Dental advisory given  Plan Discussed with: CRNA  Anesthesia Plan Comments:         Anesthesia Quick Evaluation

## 2020-12-25 NOTE — Progress Notes (Signed)
eLink Physician-Brief Progress Note Patient Name: [name] [name] DOB: 22-Jan-1941 MRN: [id number]   Date of Service  12/25/2020  HPI/Events of Note  Patient admitted with urolithiasis, and sepsis of urinary tract origin, atrial fibrillation, altered mental status requiring intubation and mechanical ventilation.  eICU Interventions  New Patient Evaluation.        Rex Oesterle U Dehlia Kilner 12/25/2020, 3:12 AM

## 2020-12-25 NOTE — Transfer of Care (Signed)
Immediate Anesthesia Transfer of Care Note  Patient: [name] [name]  Procedure(s) Performed: CYSTOSCOPY WITH RETROGRADE PYELOGRAM/URETERAL STENT PLACEMENT (Right Ureter)  Patient Location: ICU  Anesthesia Type:General  Level of Consciousness: unresponsive and Patient remains intubated per anesthesia plan  Airway & Oxygen Therapy: Patient remains intubated per anesthesia plan and Patient placed on Ventilator (see vital sign flow sheet for setting)  Post-op Assessment: Report given to RN and Post -op Vital signs reviewed and stable  Post vital signs: Reviewed and stable  Last Vitals:  Vitals Value Taken Time  BP 115/55 12/25/20 0207  Temp    Pulse 129 12/25/20 0221  Resp 18 12/25/20 0221  SpO2 98 % 12/25/20 0221  Vitals shown include unvalidated device data.  Last Pain:  Vitals:   12/25/20 0020  TempSrc:   PainSc: 0-No pain         Complications: No complications documented.

## 2020-12-25 NOTE — H&P (Signed)
NAME:  Sian Rockers, MRN:  [id number], DOB:  04/04/1941, LOS: 0 ADMISSION DATE:  12/24/2020, CONSULTATION DATE:  12/25/2020 REFERRING MD:  Spero Geralds, MD, CHIEF COMPLAINT:  Flank pain  History of Present Illness:  The patient is a 80 y.o. woman with history of HTN, gout,  Unprovoked PE (2020) on Eliquis who presented to [facility] ED with right sided flank pain and decreased oral intake. She was found to have a ureteral stone with hydronephrosis on CT A/P. She developed A. Fib with RVR while in the [facility] ED and was given diltiazem and metoprolol resulting in hypotension. She was given IVF and then started on norepinephrine. She has since been started on amiodarone for her A. Fib. [facility] was transferred from [facility] to [facility] ED for evaluation with urology for ureteral stent placement. Returned from OR intubated and on pressors.  Pertinent  Medical History  HTN Gout PE unprovoked on AC DM2 not on insulin   Significant Hospital Events: Including procedures, antibiotic start and stop dates in addition to other pertinent events   .   Interim History / Subjective:    Objective   Blood pressure (!) 133/114, pulse (!) 120, temperature 99.4 F (37.4 C), temperature source Oral, resp. rate (!) 26, height 5\' 3"  (1.6 m), weight 94.8 kg, SpO2 93 %.        Intake/Output Summary (Last 24 hours) at 12/25/2020 0011 Last data filed at 12/24/2020 2136 Gross per 24 hour  Intake 150 ml  Output 300 ml  Net -150 ml   Filed Weights   12/24/20 1636  Weight: 94.8 kg    Examination: General: intubated, sedated HENT: ETT to vent Lungs: ctab no wheezes or crackles Cardiovascular: irregularly irregular, HR in 100s Abdomen: obese, soft, nontender Extremities: warm and dry Neuro: sedated RASS -2 GU: foley in place, pink tinged urine  Labs/imaging that I havepersonally reviewed  (right click and "Reselect all SmartList Selections" daily)  CT A/P right hydronephrosis with 77mm stone in right ureter UA shows  WBC, bacteremia, blood Troponin elevated to 256 (down from 280) Lactic acid 3.4 WBC 15.6 INR 2.2 (on eliquis) Cr 4.26  EKG - shows a fib, no ST segment elevation  Resolved Hospital Problem list     Assessment & Plan:  Anselma Herbel is a 80 y.o. woman who presents with:  Septic Shock secondary to UTI Obstructed, infected, ureteral stone - POD #1 for cystoscopy, ureteral stent placement with urology - titrate down pressors as tolerated goal  - multifactorial AKI from infection, obstruction and hypovolemia - continue ceftriaxone, follow urine cx  Atrial Fibrillation with RVR Elevated Troponin - driven by sepsis, demand ischemia - will titrate down pressors, suspect this will improve  Acute hypoxemic respiratory failure - remained intubated postprocedure - on propofol, fentanyl - suspect can wean sedation with SAT in am with SBT. Potential extubation  AKI Anion gap metabolic acidosis - suspect from azotemia, maybe some non gap RTA - uop good, trend BMET - may need some gentle IVF  Unprovoked VTE on Eliquis in 2020 - Continue home eliquis, can stop [facility] once this is resumed  HTN Hold home meds due to shock and AKI  DM2 - monitor CBGs  Gout Hold allopurinol due to AKI   Best practice (right click and "Reselect all SmartList Selections" daily)  Diet:  NPO Pain/Anxiety/Delirium protocol (if indicated): Yes (RASS goal -1) VAP protocol (if indicated): Yes DVT prophylaxis: Systemic AC GI prophylaxis: H2B Glucose control:  SSI No  Central venous access:  N/A Arterial line:  N/A Foley:  Yes, and it is still needed Mobility:  bed rest  PT consulted: N/A Last date of multidisciplinary goals of care discussion []  Code Status:  full code Disposition: ICU  Labs   CBC: Recent Labs  Lab 12/24/20 1719  WBC 15.6*  HGB 12.9  HCT 38.6  MCV 94.8  PLT 61*    Basic Metabolic Panel: Recent Labs  Lab 12/24/20 1719  NA 133*  K 3.7  CL 101  CO2 20*  GLUCOSE  109*  BUN 68*  CREATININE 4.26*  CALCIUM 10.0   GFR: Estimated Creatinine Clearance: 11.7 mL/min (A) (by C-G formula based on SCr of 4.26 mg/dL (H)). Recent Labs  Lab 12/24/20 1719 12/24/20 1812  WBC 15.6*  --   LATICACIDVEN  --  3.4*    Liver Function Tests: Recent Labs  Lab 12/24/20 1812  AST 23  ALT 19  ALKPHOS 123  BILITOT 1.0  PROT 6.4*  ALBUMIN 3.4*   Recent Labs  Lab 12/24/20 1812  LIPASE 15   No results for input(s): AMMONIA in the last 168 hours.  ABG    Component Value Date/Time   PHART 7.261 (L) 12/24/2020 2115   PCO2ART 29.4 (L) 12/24/2020 2115   PO2ART 160 (H) 12/24/2020 2115   HCO3 14.6 (L) 12/24/2020 2115   ACIDBASEDEF 12.9 (H) 12/24/2020 2115   O2SAT 98.4 12/24/2020 2115     Coagulation Profile: Recent Labs  Lab 12/24/20 1812  INR 2.2*    Cardiac Enzymes: No results for input(s): CKTOTAL, CKMB, CKMBINDEX, TROPONINI in the last 168 hours.  HbA1C: HB A1C (BAYER DCA - WAIVED)  Date/Time Value Ref Range Status  10/20/2020 09:50 AM 5.7 <7.0 % Final    Comment:                                          Diabetic Adult            <7.0                                       Healthy Adult        4.3 - 5.7                                                           (DCCT/NGSP) American Diabetes Association's Summary of Glycemic Recommendations for Adults with Diabetes: Hemoglobin A1c <7.0%. More stringent glycemic goals (A1c <6.0%) may further reduce complications at the cost of increased risk of hypoglycemia.   06/04/2020 08:08 AM 5.4 <7.0 % Final    Comment:                                          Diabetic Adult            <7.0  Healthy Adult        4.3 - 5.7                                                           (DCCT/NGSP) American Diabetes Association's Summary of Glycemic Recommendations for Adults with Diabetes: Hemoglobin A1c <7.0%. More stringent glycemic goals (A1c <6.0%) may further reduce  complications at the cost of increased risk of hypoglycemia.     CBG: No results for input(s): GLUCAP in the last 168 hours.  Review of Systems:   Unable to obtain due to mental status  Past Medical History:  She,  has a past medical history of Anemia, Arthritis, Diabetes mellitus without complication ([place]), DVT (deep venous thrombosis) ([place]), Dyspnea, GERD (gastroesophageal reflux disease), Headache(784.0), Hypertension, Hypothyroidism, and Pulmonary embolism ([place]).   Surgical History:   Past Surgical History:  Procedure Laterality Date  . ABDOMINAL HYSTERECTOMY    . BIOPSY  12/08/2019   Procedure: BIOPSY;  Surgeon: Daneil Dolin, MD;  Location: [facility] ENDO SUITE;  Service: Endoscopy;;  . CATARACT EXTRACTION, BILATERAL    . COLONOSCOPY N/A 12/08/2019   polyps (tubular adenoma), diverticulosis, colonic lipoma, no surveillance due to age  . 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  [name] N/A 01/13/2020   Procedure: GIVENS CAPSULE STUDY;  Surgeon: Daneil Dolin, MD;  Location: [facility]O [facility];  Service: Endoscopy;  Laterality: N/A;  7:30am  . MULTIPLE TOOTH EXTRACTIONS     60's  . PARATHYROIDECTOMY    . POLYPECTOMY  12/08/2019   Procedure: POLYPECTOMY;  Surgeon: Daneil Dolin, MD;  Location: [facility] ENDO SUITE;  Service: Endoscopy;;  . TOTAL HIP ARTHROPLASTY Right 10/29/2013   Procedure: RIGHT TOTAL HIP ARTHROPLASTY ANTERIOR APPROACH;  Surgeon: Mauri Pole, MD;  Location: [facility] ORS;  Service: Orthopedics;  Laterality: Right;     Social History:   reports that she has never smoked. She has never used smokeless tobacco. She reports that she does not drink alcohol and does not use drugs.   Family History:  Her family history includes Cancer (age of onset: 70) in her sister; Cancer (age of onset: 110) in her mother; Colitis (age of onset: 65) in her sister; Healthy in her daughter,  son, son, and son; Heart attack (age of onset: 66) in her brother; Heart disease in her brother; Heart disease (age of onset: 49) in her father; Pulmonary embolism (age of onset: 23) in her brother.   Allergies No Known Allergies   Home Medications  Prior to Admission medications   Medication Sig Start Date End Date Taking? Authorizing Provider  acetaminophen (TYLENOL) 325 MG tablet Take 2 tablets (650 mg total) by mouth every 6 (six) hours as needed for mild pain or headache (or Fever >/= 101). 04/04/19  Yes Emokpae, Courage, MD  allopurinol (ZYLOPRIM) 100 MG tablet TAKE 1 TABLET BY MOUTH  DAILY 10/22/20  Yes [name] F, FNP  amLODipine (NORVASC) 10 MG tablet TAKE 1 TABLET BY MOUTH  DAILY 11/16/20  Yes Claretta Fraise, MD  apixaban (ELIQUIS) 2.5 MG TABS tablet Take 1 tablet (2.5 mg total) by mouth 2 (two) times daily. 02/05/20  Yes Minus Breeding, MD  Cholecalciferol (VITAMIN D-3) 5000 UNITS TABS Take 5,000 Units by  mouth daily.   Yes [provider]  diclofenac Sodium (VOLTAREN) 1 % GEL Apply 4 g topically 4 (four) times daily. 12/15/20  Yes Loman Brooklyn, FNP  EUTHYROX 50 MCG tablet TAKE 1 TABLET BY MOUTH ONCE DAILY BEFORE BREAKFAST 09/14/20  Yes Claretta Fraise, MD  ferrous sulfate 325 (65 FE) MG tablet Take 325 mg by mouth 2 (two) times daily.   Yes [provider]  furosemide (LASIX) 20 MG tablet TAKE 1 TABLET BY MOUTH  TWICE DAILY 10/22/20  Yes [name] F, FNP  loratadine (CLARITIN) 10 MG tablet Take 10 mg by mouth daily.   Yes [provider]  oxybutynin (DITROPAN-XL) 10 MG 24 hr tablet Take 1 tablet (10 mg total) by mouth at bedtime. 11/23/20  Yes Loman Brooklyn, FNP  pantoprazole (PROTONIX) 40 MG tablet Take 1 tablet (40 mg total) by mouth 2 (two) times daily before a meal. 11/16/20  Yes [name] F, FNP  simvastatin (ZOCOR) 40 MG tablet TAKE 1 TABLET BY MOUTH IN  THE EVENING 08/05/20  Yes Ivy Lynn, NP  valsartan (DIOVAN) 80 MG tablet Take 1  tablet (80 mg total) by mouth 2 (two) times daily. 06/24/20  Yes Minus Breeding, MD  ONE TOUCH ULTRA TEST test strip USE UP TO FOUR TIMES DAILY AS DIRECTED 10/04/17   Terald Sleeper, PA-C     Critical care time: 49    The patient is critically ill due to respiratory failure, septic shock.  Critical care was necessary to treat or prevent imminent or life-threatening deterioration.  Critical care was time spent personally by me on the following activities: development of treatment plan with patient and/or surrogate as well as nursing, discussions with consultants, evaluation of patient's response to treatment, examination of patient, obtaining history from patient or surrogate, ordering and performing treatments and interventions, ordering and review of laboratory studies, ordering and review of radiographic studies, pulse oximetry, re-evaluation of patient's condition and participation in multidisciplinary rounds.   Critical Care Time devoted to patient care services described in this note is 44 minutes. This time reflects time of care of this Bruce . This critical care time does not reflect separately billable procedures or procedure time, teaching time or supervisory time of PA/NP/Med student/Med Resident etc but could involve care discussion time.       Spero Geralds [facility] 12/25/2020 12:28 AM  Pager: see AMION  If no response to pager , please call critical care on call (see AMION) until 7pm After 7:00 pm call Elink

## 2020-12-25 NOTE — TOC Initial Note (Signed)
Transition of Care [facility]) - Initial/Assessment Note    Patient Details  Name: [name] [name] MRN: [id number] Date of Birth: 08/23/1940  [facility] CM/SW Contact:    Leeroy Cha, RN Phone Number: 12/25/2020, 9:25 AM  Clinical Narrative:                 80 y.o. woman who presented to [facility] ED with right sided flank pain and decreased oral intake secondary to urosepsis with ureteral stone with hydronephrosis on CT A/P.   Pertinent  Medical History  HTN Gout PE unprovoked on AC DM2 not on insulin  Significant Hospital Events: Including procedures, antibiotic start and stop dates in addition to other pertinent events    6/2 admitted with right sided flank pain and decreased oral intake secondary to urosepsis with ureteral stone with hydronephrosis on CT A/P. Developed A-fib RVR at [facility] ED became hypotensive with Cardizem improved with IV Amio   Plan tbd due to condition  Expected Discharge Plan: Home/Self Care Barriers to Discharge: Continued Medical Work up   Patient Goals and CMS Choice Patient states their goals for this hospitalization and ongoing recovery are:: intubate      Expected Discharge Plan and Services Expected Discharge Plan: Home/Self Care   Discharge Planning Services: CM Consult                                          Prior Living Arrangements/Services                       Activities of Daily Living      Permission Sought/Granted                  Emotional Assessment              Admission diagnosis:  Septic shock ([place]) [A41.9, R65.21] Patient Active Problem List   Diagnosis Date Noted  . Septic shock ([place]) 12/25/2020  . Educated about COVID-19 virus infection 02/02/2020  . Iron deficiency anemia due to chronic blood loss   . Occult blood in stools   . Primary osteoarthritis of both knees 05/14/2019  . Unprovoked DVT and Unprovoked Pulmonary Embolism -Dxed 03/2019 04/03/2019  . Sciatica of  right side 10/18/2018  . Seborrheic keratosis 10/17/2016  . Hyperlipidemia associated with type 2 diabetes mellitus ([place]) 10/17/2016  . Overactive bladder 10/17/2016  . Gastroesophageal reflux disease without esophagitis 10/17/2016  . Primary osteoarthritis involving multiple joints 10/17/2016  . Chronic gout without tophus 10/17/2016  . Type 2 diabetes mellitus without complication, without long-term current use of insulin ([place]) 05/16/2016  . Essential hypertension 05/16/2016  . Hypothyroidism 05/16/2016  . Morbid obesity ([place]) 10/30/2013  . S/P right THA, AA 10/29/2013   PCP:  Loman Brooklyn, FNP Pharmacy:   [facility] [address] [place] [address] Phone: [phone] Fax: [phone]  [facility]  ([place], [place], [address] [place], [place] [address] Phone: [phone] Fax: [phone]     Social Determinants of Health (SDOH) Interventions    Readmission Risk Interventions No flowsheet data found.

## 2020-12-25 NOTE — Anesthesia Postprocedure Evaluation (Signed)
Anesthesia Post Note  Patient: [name] [name]  Procedure(s) Performed: CYSTOSCOPY WITH RETROGRADE PYELOGRAM/URETERAL STENT PLACEMENT (Right Ureter)     Patient location during evaluation: ICU Anesthesia Type: General Level of consciousness: patient remains intubated per anesthesia plan and sedated Pain management: pain level controlled Vital Signs Assessment: post-procedure vital signs reviewed and stable Respiratory status: patient remains intubated per anesthesia plan Cardiovascular status: blood pressure returned to baseline and stable Postop Assessment: no apparent nausea or vomiting Anesthetic complications: no   No complications documented.  Last Vitals:  Vitals:   12/25/20 0055 12/25/20 0216  BP:    Pulse:  (!) 125  Resp: (!) 24 18  Temp:    SpO2: (!) 81% 91%    Last Pain:  Vitals:   12/25/20 0020  TempSrc:   PainSc: 0-No pain                 Mackenzie Lia L Pierce Biagini

## 2020-12-25 NOTE — ED Notes (Signed)
Surgical consent signed for patient.

## 2020-12-25 NOTE — ED Provider Notes (Signed)
  Provider Note MRN:  [id number]  Arrival date & time: 12/25/20    ED Course and Medical Decision Making  Assumed care from Dr. Roderic Palau upon patient transfer.  Infected obstructed kidney stone, concern for sepsis, tachycardic, hypotensive, on arrival is on 13 of norepinephrine, also on amiodarone drip.  Intensivist is consulted for admission and will come to see patient soon.  Patient is awake, alert, fully conversant, blood pressures are within normal limits on the levo drip.  We will also touch base with urology to ensure they know the patient is here.  Patient will need an emergent stent.  Procedures  Final Clinical Impressions(s) / ED Diagnoses  No diagnosis found.  ED Discharge Orders    None        Barth Kirks. Sedonia Small, [place] [email or web address]    Maudie Flakes, MD 12/25/20 [phone]

## 2020-12-25 NOTE — Progress Notes (Signed)
eLink Physician-Brief Progress Note Patient Name: [name] [name] DOB: [birth year] MRN: [id number]   Date of Service  12/25/2020  HPI/Events of Note  Hypokalemia - K+ = 3.0. Patient still has 2 more runs of K+ left.   eICU Interventions  Will repeat BNP at 12 midnight after all ordered K+ given.      Intervention Category Major Interventions: Electrolyte abnormality - evaluation and management  [name] [name] [place] 12/25/2020, 9:44 PM

## 2020-12-25 NOTE — ED Notes (Addendum)
Patient arrived on Amiodarone drip at 30mg /hr and Levophed drip at 64mcg/min from [place], ED RN. This RN only changed the Levophed drip to 46mcg/min, as the arriving order set had a maximum of 43mcg/min per [facility] ED order at 23:59. This RN did not touch the rate of the amiodarone drip during the patients ED stay. Only the Levophed drip from 36mcg/min to 75mcg/min at 23:59 as documented in the [facility].

## 2020-12-25 NOTE — Progress Notes (Signed)
NAME:  [name] [name], MRN:  [id number], DOB:  01/12/1941, LOS: 0 ADMISSION DATE:  12/24/2020, CONSULTATION DATE:  12/25/2020 REFERRING MD:  Spero Geralds, MD, CHIEF COMPLAINT:  Flank pain  Brief Narrative:  [name] [name] is a 80 y.o. woman who presented to [facility] ED with right sided flank pain and decreased oral intake secondary to urosepsis with ureteral stone with hydronephrosis on CT A/P.   Pertinent  Medical History  HTN Gout PE unprovoked on AC DM2 not on insulin  Significant Hospital Events: Including procedures, antibiotic start and stop dates in addition to other pertinent events   . 6/2 admitted with right sided flank pain and decreased oral intake secondary to urosepsis with ureteral stone with hydronephrosis on CT A/P. Developed A-fib RVR at [facility] ED became hypotensive with Cardizem improved with IV Amio   Interim History / Subjective:  Sedated on vent with no acute issues overnight   Objective   Blood pressure (!) 101/52, pulse (!) 110, temperature 98.6 F (37 C), temperature source Axillary, resp. rate (!) 25, height 5\' 3"  (1.6 m), weight 99.6 kg, SpO2 100 %.    Vent Mode: PRVC FiO2 (%):  [90 %-100 %] 90 % Set Rate:  [18 bmp-26 bmp] 26 bmp Vt Set:  [410 mL] 410 mL PEEP:  [5 cmH20] 5 cmH20 Plateau Pressure:  [17 cmH20] 17 cmH20   Intake/Output Summary (Last 24 hours) at 12/25/2020 0755 Last data filed at 12/25/2020 0600 Gross per 24 hour  Intake 973.55 ml  Output 650 ml  Net 323.55 ml   Filed Weights   12/24/20 1636 12/25/20 0433  Weight: 94.8 kg 99.6 kg    Examination: General: Chronically ill appearing elderly female lying in bed on mechanical ventilation, in NAD HEENT: ETT, MM pink/moist, PERRL,  Neuro: Sedated on vent  CV: s1s2 regular rate and rhythm, no murmur, rubs, or gallops,  PULM:  Clear to ascultation, no added breath sounds, no increased work of breathing GI: soft, bowel sounds active in all 4 quadrants, non-tender,  non-distended Extremities: warm/dry, no edema  Skin: no rashes or lesions  Labs/imaging that I have personally reviewed    6/2 CT A/P right hydronephrosis with 82mm stone in right ureter  6/2 UA shows WBC, bacteremia, blood  Labs: Na 133, Creat 3.88, lactic 3.4 > 2.8, WBC 21.0  Resolved Hospital Problem list     Assessment & Plan:  Septic Shock secondary to UTI -Patient presented with tachypnea, hypotensive, tachycardia, leukocytosis, and lactic acidosis  with acute concern for infection meeting criteria for septic shock Obstructed, infected, ureteral stone - POD #1 for cystoscopy, ureteral stent placement with urology P: Vent support as below  Follow cultures  Continue Ceftriaxone  Continue pressors for MAP goal > 65 Monitor urine output Capillary refill:  Multifactorial AKI from infection, obstruction and hypovolemia -Creatinine 4.23 on admisison  Non-anion gap metabolic acidosis - suspect from azotemia, maybe some non gap RTA P: Follow renal function  Monitor urine output Trend Bmet Avoid nephrotoxins Ensure adequate renal perfusion  IV hydration   Continue Sodium Bicarb drip   Atrial Fibrillation with RVR Elevated Troponin - downtrending  - driven by sepsis, demand ischemia P: Continuous telemetry  Continue IV amiodarone  Wean pressors  Trend underlying cause  Trop trend   Acute hypoxemic respiratory failure - remained intubated postprocedure P: Continue ventilator support with lung protective strategies  Hopefully can wean vent and sedation to allow for SBT and hopeful extubation today  Wean PEEP and  FiO2 for sats greater than 90%. Head of bed elevated 30 degrees. Plateau pressures less than 30 cm H20.  Follow intermittent chest x-ray and ABG.   Ensure adequate pulmonary hygiene  Follow cultures  VAP bundle in place  PAD protocol  Unprovoked VTE on Eliquis in 2020 P: Continue home Eliquis   HTN -Home medications include Norvasc, Lasix,   P: Home medications remains on hold  Continuous telemetry   DM2 P: Monitor CBG currently well controlled with no need of SSI   Gout P: Home Allopurinol on hold  Hypothyroidism  P: Continue home Euthyrox    Best practice   Diet:  NPO Pain/Anxiety/Delirium protocol (if indicated): Yes (RASS goal -1) VAP protocol (if indicated): Yes DVT prophylaxis: Systemic AC GI prophylaxis: H2B Glucose control:  SSI No Central venous access:  N/A Arterial line:  N/A Foley:  Yes, and it is still needed Mobility:  bed rest  PT consulted: N/A Last date of multidisciplinary goals of care discussion Update family daily  Code Status:  full code Disposition: ICU  Critical care time:    Performed by: Johnsie Cancel  Total critical care time: 38 minutes  Critical care time was exclusive of separately billable procedures and treating other patients.  Critical care was necessary to treat or prevent imminent or life-threatening deterioration.  Critical care was time spent personally by me on the following activities: development of treatment plan with patient and/or surrogate as well as nursing, discussions with consultants, evaluation of patient's response to treatment, examination of patient, obtaining history from patient or surrogate, ordering and performing treatments and interventions, ordering and review of laboratory studies, ordering and review of radiographic studies, pulse oximetry and re-evaluation of patient's condition.  Johnsie Cancel, NP-C [facility] Personal contact information can be found on Amion  12/25/2020, 8:20 AM

## 2020-12-25 NOTE — Progress Notes (Signed)
PHARMACY - PHYSICIAN COMMUNICATION CRITICAL VALUE ALERT - BLOOD CULTURE IDENTIFICATION (BCID)  [name] [name] is an 80 y.o. female who presented to [facility] on 12/24/2020 with a chief complaint of right sided flank pain and decreased oral intake.   Assessment: septic shock secondary to UTI, right proximal ureteral stone causing right hydroureteronephrosis s/p right ureteral stent placement 6/3  Name of physician (or Provider) Contacted: Dr. Erin Fulling  Current antibiotics: Ceftriaxone 2g IV q24h   Changes to prescribed antibiotics recommended:  Patient is on recommended antibiotics - No changes needed  F/u susceptibilities, repeat blood cultures   Results for orders placed or performed during the hospital encounter of 12/24/20  Blood Culture ID Panel (Reflexed) (Collected: 12/24/2020  6:12 PM)  Result Value Ref Range   Enterococcus faecalis NOT DETECTED NOT DETECTED   Enterococcus Faecium NOT DETECTED NOT DETECTED   Listeria monocytogenes NOT DETECTED NOT DETECTED   Staphylococcus species NOT DETECTED NOT DETECTED   Staphylococcus aureus (BCID) NOT DETECTED NOT DETECTED   Staphylococcus epidermidis NOT DETECTED NOT DETECTED   Staphylococcus lugdunensis NOT DETECTED NOT DETECTED   Streptococcus species NOT DETECTED NOT DETECTED   Streptococcus agalactiae NOT DETECTED NOT DETECTED   Streptococcus pneumoniae NOT DETECTED NOT DETECTED   Streptococcus pyogenes NOT DETECTED NOT DETECTED   A.calcoaceticus-baumannii NOT DETECTED NOT DETECTED   Bacteroides fragilis NOT DETECTED NOT DETECTED   Enterobacterales DETECTED (A) NOT DETECTED   Enterobacter cloacae complex NOT DETECTED NOT DETECTED   Escherichia coli DETECTED (A) NOT DETECTED   Klebsiella aerogenes NOT DETECTED NOT DETECTED   Klebsiella oxytoca NOT DETECTED NOT DETECTED   Klebsiella pneumoniae NOT DETECTED NOT DETECTED   Proteus species NOT DETECTED NOT DETECTED   Salmonella species NOT DETECTED NOT DETECTED   Serratia  marcescens NOT DETECTED NOT DETECTED   Haemophilus influenzae NOT DETECTED NOT DETECTED   Neisseria meningitidis NOT DETECTED NOT DETECTED   Pseudomonas aeruginosa NOT DETECTED NOT DETECTED   Stenotrophomonas maltophilia NOT DETECTED NOT DETECTED   Candida albicans NOT DETECTED NOT DETECTED   Candida auris NOT DETECTED NOT DETECTED   Candida glabrata NOT DETECTED NOT DETECTED   Candida krusei NOT DETECTED NOT DETECTED   Candida parapsilosis NOT DETECTED NOT DETECTED   Candida tropicalis NOT DETECTED NOT DETECTED   Cryptococcus neoformans/gattii NOT DETECTED NOT DETECTED   CTX-M ESBL NOT DETECTED NOT DETECTED   Carbapenem resistance IMP NOT DETECTED NOT DETECTED   Carbapenem resistance KPC NOT DETECTED NOT DETECTED   Carbapenem resistance NDM NOT DETECTED NOT DETECTED   Carbapenem resist OXA 48 LIKE NOT DETECTED NOT DETECTED   Carbapenem resistance VIM NOT DETECTED NOT DETECTED    Luiz Ochoa 12/25/2020  5:00 PM

## 2020-12-25 NOTE — Procedures (Signed)
Extubation Procedure Note  Patient Details:   Name: [name] [name] DOB: June 13, 1941 MRN: [id number]   Airway Documentation:    Vent end date: (not recorded) Vent end time: (not recorded)   Evaluation  O2 sats: stable throughout Complications: No apparent complications Patient did tolerate procedure well. Bilateral Breath Sounds: Clear   Yes  Elsie Stain 12/25/2020, 11:12 AM

## 2020-12-25 NOTE — Progress Notes (Addendum)
eLink Physician-Brief Progress Note Patient Name: [name] [name] DOB: 07/10/1941 MRN: [id number]   Date of Service  12/25/2020  HPI/Events of Note  PH 7.15, patient needs order for Propofol and Foley catheter.  Mg++ 1.6,  Lactate 3.0.  eICU Interventions  Orders entered for Foley and Propofol, respiratory rate increased to 26 to provide some compensation for patient's metabolic acidosis, and an amp of Bicarb ordered iv push followed by a bicarb infusion. Magnesium repleted, Albumin 5 % 250 ml iv x 1 for lactate of 3.0.        Kerry Kass Shareese Macha 12/25/2020, 4:46 AM

## 2020-12-25 NOTE — Anesthesia Procedure Notes (Signed)
Procedure Name: Intubation Date/Time: 12/25/2020 1:15 AM Performed by: Cynda Familia, CRNA Pre-anesthesia Checklist: Patient identified, Emergency Drugs available, Suction available and Patient being monitored Patient Re-evaluated:Patient Re-evaluated prior to induction Oxygen Delivery Method: Circle System Utilized Preoxygenation: Pre-oxygenation with 100% oxygen Induction Type: IV induction, Cricoid Pressure applied and Rapid sequence Ventilation: Mask ventilation without difficulty Laryngoscope Size: Miller and 2 Grade View: Grade I Tube type: Oral Number of attempts: 1 Airway Equipment and Method: Stylet Placement Confirmation: ETT inserted through vocal cords under direct vision,  positive ETCO2 and breath sounds checked- equal and bilateral Secured at: 22 cm Tube secured with: Tape Dental Injury: Teeth and Oropharynx as per pre-operative assessment  Comments: Smooth IV induction [place]-- intubation AM CRNA atraumatic -- bilat BS

## 2020-12-25 NOTE — Progress Notes (Signed)
Day of Surgery Subjective: Extubated, following commands, responding appropriately, pain controlled.  Objective: Vital signs in last 24 hours: Temp:  [97.6 F (36.4 C)-99.8 F (37.7 C)] 97.6 F (36.4 C) (06/03 1154) Pulse Rate:  [28-141] 84 (06/03 1200) Resp:  [18-37] 27 (06/03 1200) BP: (68-246)/(41-200) 137/62 (06/03 1145) SpO2:  [75 %-100 %] 95 % (06/03 1200) FiO2 (%):  [45 %-100 %] 45 % (06/03 0751) Weight:  [94.8 kg-99.6 kg] 99.6 kg (06/03 0433)  Intake/Output from previous day: 06/02 0701 - 06/03 0700 In: 1095.4 [I.V.:895.4; IV Piggyback:200.1] Out: 650 [Urine:650] Intake/Output this shift: Total I/O In: 774 [I.V.:520.1; IV Piggyback:253.9] Out: 150 [Urine:150]  Physical Exam:  General: Alert and oriented CV: irregular, normal rate Lungs: Clear Abdomen: Soft, ND, NT Ext: NT, No erythema  Lab Results: Recent Labs    12/24/20 1719 12/25/20 0253  HGB 12.9 12.6  HCT 38.6 38.5   BMET Recent Labs    12/24/20 1719 12/25/20 0253  NA 133* 133*  K 3.7 4.3  CL 101 106  CO2 20* 14*  GLUCOSE 109* 128*  BUN 68* 65*  CREATININE 4.26* 3.88*  CALCIUM 10.0 8.7*     Studies/Results: CT ABDOMEN PELVIS WO CONTRAST  Result Date: 12/24/2020 CLINICAL DATA:  Abdomen pain with fever EXAM: CT ABDOMEN AND PELVIS WITHOUT CONTRAST TECHNIQUE: Multidetector CT imaging of the abdomen and pelvis was performed following the standard protocol without IV contrast. COMPARISON:  CT 01/22/2020 FINDINGS: Lower chest: Lung bases demonstrate streaky atelectasis. Cardiomegaly. Hepatobiliary: Slightly distended gallbladder. No calcified stone. No focal hepatic abnormality or biliary dilatation. Pancreas: Unremarkable. No pancreatic ductal dilatation or surrounding inflammatory changes. Spleen: Normal in size without focal abnormality. Adrenals/Urinary Tract: Adrenal glands are normal. Multiple low-density lesions in the kidneys consistent with cysts. Moderate right perinephric fat stranding. Mild  right hydronephrosis, secondary to a 7 x 10 mm stone in the proximal right ureter just past the UPJ. Bladder is unremarkable. There are bilateral intrarenal stones. Small cortical hyperdensity within the lower pole left kidney too small to further characterize. Stomach/Bowel: The stomach is nonenlarged. No dilated small bowel. Negative appendix. Diverticular disease of the sigmoid colon. Vascular/Lymphatic: Moderate aortic atherosclerosis. No aneurysm. No suspicious nodes. Reproductive: Status post hysterectomy. No adnexal masses. Other: Negative for free air. Trace fluid within the bilateral colic gutters. Right retroperitoneal edema and small fluid presumably due to obstructive kidney disease. Musculoskeletal: Bilateral hip replacements. Grade 1 anterolisthesis L3 on L4 and L4 on L5. Diffuse degenerative changes. No acute osseous abnormality IMPRESSION: 1. Mild right hydronephrosis, secondary to a 7 x 10 mm stone in the proximal right ureter just distal to the UPJ. 2. There are intrarenal stones bilaterally 3. There is gallbladder distension without inflammatory change or calcified stone. Electronically Signed   By: Donavan Foil M.D.   On: 12/24/2020 19:49   DG Chest 2 View  Result Date: 12/24/2020 CLINICAL DATA:  Shortness of breath, chest pain EXAM: CHEST - 2 VIEW COMPARISON:  04/03/2019 FINDINGS: Cardiomegaly. Aortic atherosclerosis. No confluent opacities or effusions. No acute bony abnormality. IMPRESSION: Mild cardiomegaly.  No active disease. Electronically Signed   By: Rolm Baptise M.D.   On: 12/24/2020 17:10   DG Abd 1 View  Result Date: 12/25/2020 CLINICAL DATA:  Enteric catheter placement EXAM: ABDOMEN - 1 VIEW COMPARISON:  12/24/2020 FINDINGS: Frontal view of the lower chest and upper abdomen demonstrates enteric catheter passing below diaphragm, tip projecting over the gastric body. Paucity of bowel gas. Stable enlarged cardiac silhouette. IMPRESSION: 1. Enteric catheter  tip projecting over the  gastric body. Electronically Signed   By: Randa Ngo M.D.   On: 12/25/2020 03:09   DG Chest Port 1 View  Result Date: 12/24/2020 CLINICAL DATA:  Shortness of breath and chest pain. EXAM: PORTABLE CHEST 1 VIEW COMPARISON:  Radiograph earlier today. Lung bases from abdominal CT earlier today. FINDINGS: Lung volumes are low. Similar cardiomegaly increasing bibasilar atelectasis. No pulmonary edema, large pleural effusion, or pneumothorax. Stable osseous structures. IMPRESSION: Low lung volumes with increasing bibasilar atelectasis from earlier today. Electronically Signed   By: Keith Rake M.D.   On: 12/24/2020 20:57   DG C-Arm 1-60 Min-No Report  Result Date: 12/25/2020 Fluoroscopy was utilized by the requesting physician.  No radiographic interpretation.    Assessment/Plan: 1. Right proximal ureteral stone causing right hydroureteronephrosis in setting of sepsis and AKI: CT A/P 12/24/2020 with 10 x 7 mm right proximal ureteral stone with mild right hydronephrosis.  Also seen with approximately 3 mm right lower pole renal stone and punctate left renal stones.  S/p right ureteral stent placement on 12/25/2020.  2. Septic shock due to above 3. AKI: Creatinine 4.2 at initial consultation from baseline of 1 4. UTI: Urinalysis with likely infection. Urine culture pending. 5. Atrial fibrillation 6. Hypotension 7. Elevated troponin  -Leave foley catheter in place today. Void trial tomorrow if remains afebrile. -Continue antibiotics -F/u urine culture. Will need to be discharged home on culture specific antiobiotics -I have messaged schedulers to arrange for followup -I discussed case with both her daughter and son -Following   LOS: 0 days   Matt R. Garren Greenman MD 12/25/2020, 1:21 PM [facility]  Pager: [phone]

## 2020-12-25 NOTE — Op Note (Addendum)
Operative Note  Preoperative diagnosis:  1.  Right ureteral stone 2.  Right hydronephrosis 3.  AKI 4.  Sepsis  Postoperative diagnosis: 1.  Right ureteral stone 2.  Right hydronephrosis 3.  AKI 4.  Sepsis  Procedure(s): 1.  Cystoscopy 2. Right retrograde pyelogram with interpretation 3. Right ureteral stent placement 4. Fluoroscopy <1 hour with intraoperative interpretation  Surgeon: Rexene Alberts, MD  Assistants:  None  Anesthesia:  General  Complications:  None  EBL: Minimal  Specimens: 1. None  Drains/Catheters: 1.  6Fr x 26cm right ureteral stent  Intraoperative findings:   1. Cystoscopy demonstrated no suspicious lesions, masses, stones. It did indicate cystitis throughout bladder. 2. Right retrograde pyelogram demonstrated moderate right hydronephrosis. 3. Successful right ureteral stent placement with curl in the renal pelvis and bladder respectively.  Indication:  [name] [name] is a 80 y.o. female with CT A/P 12/25/2018 demonstrating 10 mm x 7 mm right proximal ureteral stone with signs of AKI and sepsis.  After reviewing the management options for treatment, she elected to proceed with the above surgical procedure(s). We have discussed the potential benefits and risks of the procedure, side effects of the proposed treatment, the likelihood of the patient achieving the goals of the procedure, and any potential problems that might occur during the procedure or recuperation. Informed consent has been obtained.  Description of procedure: The patient was taken to the operating room and general anesthesia was induced.  The patient was placed in the dorsal lithotomy position, prepped and draped in the usual sterile fashion, and preoperative antibiotics were administered. A preoperative time-out was performed.   Cystourethroscopy was performed.  The patient's urethra was examined and was normal. The bladder was then systematically examined in its entirety. There was  no evidence for any bladder tumors, stones. Mucosa throughout was consistent with cystitis.  Scout image was obtained and I could not identify stone on fluoroscopy. Attention then turned to the right ureteral orifice. A 0.038 zip wire was passed through the right orifice and over the wire a 5 Fr open ended catheter was inserted and passed up to the level of the renal pelvis. Of note, she did have medial deviation of her ureter. There was no hydronephrotic drip. Omnipaque contrast was injected through the ureteral catheter and a retrograde pyelogram was performed with findings as dictated above. The wire was then replaced and the open ended catheter was removed.   A 6Fr x 26cm ureteral stent was advance over the wire. The stent was positioned appropriately under fluoroscopic and cystoscopic guidance.  The wire was then removed with an adequate stent curl noted in the renal pelvis as well as in the bladder. Urine was seen draining from the side holes of the stent at the end of the case.  A foley catheter was placed and the procedure ended.  The patient appeared to tolerate the procedure well and without complications.  Per anesthesiology, she will remain intubated overnight with plan for extubation in the morning. I have messaged to arrange for followup for elective URS/LL. I attempted to call daughter, [name] [name] ([phone]); however, there was no answer. There was no family member present in the ED.  Matt R. [facility]  Pager: [phone]

## 2020-12-25 NOTE — Progress Notes (Signed)
Pt was extubated per doctors orders and protocol. Pt was able to follow directions, leak test was good and she had a gag reflex. After extubation the Pt was able to cough, talk, and answer questions appropriately. RT left the vent in the room due to the Pt trying to remember how to breath. Rt will continue to monitor.

## 2020-12-25 NOTE — Progress Notes (Signed)
eLink Physician-Brief Progress Note Patient Name: [name] [name] DOB: August 28, 1940 MRN: [id number]   Date of Service  12/25/2020  HPI/Events of Note  Patient extubated and gastric tube removed. Patient is NPO.   eICU Interventions  Plan: 1. D/C Protonix PO. 2. Protonix IV.      Intervention Category Major Interventions: Other:  Lysle Dingwall 12/25/2020, 9:04 PM

## 2020-12-25 NOTE — Consult Note (Signed)
Urology Consult   Physician requesting consult: Milton Ferguson, MD  Reason for consult: Obstructing right ureteral stone, sepsis, AKI  History of Present Illness: [name] [name] is a 80 y.o. presented to [facility] last night with complaints of right-sided abdominal pain for 48 hours as well as left-sided chest pain.  She denies prior history of urolithiasis.  CT A/P 12/24/2020 demonstrated 10 x 7 mm right proximal ureteral stone with mild right hydronephrosis as well as CT A/P 12/24/2020 bilateral nonobstructing renal stones including approximately 3 mm right lower pole stone and punctate left upper pole, left interpolar left lower pole stones.  She admitted to some dysuria.  Urinalysis demonstrated many bacteria.  She had a low grade temperature T-max 99.  She had a leukocytosis of 15.6 and AKI with creatinine 4.2 from baseline of 0.8-1.0.  She is found to be hypertensive and per report, she was given 5 mg of Lopressor and 10 mg of Cardizem.  She then became relatively hypotensive.  She was then resuscitated with systolics in the 956L.  She developed atrial fibrillation and was started on amiodarone drip.  She was also started on pressor support.  She presently denies any left-sided chest pain.  Troponin was found to be elevated at 280 and has down trended to 256.  There is no obvious abnormality on EKG.  Anesthesiology at [facility] recommended transfer to either of my discussion or [facility] given elevated troponin.  She denies a history of voiding or storage urinary symptoms, hematuria, UTIs, STDs, urolithiasis, GU malignancy/trauma/surgery.  Past Medical History:  Diagnosis Date  . Anemia   . Arthritis    Ostearthritis- hips, knees, fingers  . Diabetes mellitus without complication ([place])   . DVT (deep venous thrombosis) ([place])   . Dyspnea   . GERD (gastroesophageal reflux disease)   . Headache(784.0)    tx. Valproic acid  . Hypertension   . Hypothyroidism   . Pulmonary embolism [facility])      Past Surgical History:  Procedure Laterality Date  . ABDOMINAL HYSTERECTOMY    . BIOPSY  12/08/2019   Procedure: BIOPSY;  Surgeon: Daneil Dolin, MD;  Location: [facility];  Service: Endoscopy;;  . CATARACT EXTRACTION, BILATERAL    . COLONOSCOPY N/A 12/08/2019   polyps (tubular adenoma), diverticulosis, colonic lipoma, no surveillance due to age  . 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  [name] N/A 01/13/2020   Procedure: GIVENS CAPSULE STUDY;  Surgeon: Daneil Dolin, MD;  Location: [facility];  Service: Endoscopy;  Laterality: N/A;  7:30am  . MULTIPLE TOOTH EXTRACTIONS     60's  . PARATHYROIDECTOMY    . POLYPECTOMY  12/08/2019   Procedure: POLYPECTOMY;  Surgeon: Daneil Dolin, MD;  Location: [facility];  Service: Endoscopy;;  . TOTAL HIP ARTHROPLASTY Right 10/29/2013   Procedure: RIGHT TOTAL HIP ARTHROPLASTY ANTERIOR APPROACH;  Surgeon: Mauri Pole, MD;  Location: [facility] ORS;  Service: Orthopedics;  Laterality: Right;     Current Hospital Medications:  Home meds:  No current facility-administered medications on file prior to encounter.   Current Outpatient Medications on File Prior to Encounter  Medication Sig Dispense Refill  . acetaminophen (TYLENOL) 325 MG tablet Take 2 tablets (650 mg total) by mouth every 6 (six) hours as needed for mild pain or headache (or Fever >/= 101). 12 tablet 0  . allopurinol (ZYLOPRIM) 100 MG tablet TAKE 1 TABLET  BY MOUTH  DAILY 90 tablet 0  . amLODipine (NORVASC) 10 MG tablet TAKE 1 TABLET BY MOUTH  DAILY 90 tablet 1  . apixaban (ELIQUIS) 2.5 MG TABS tablet Take 1 tablet (2.5 mg total) by mouth 2 (two) times daily. 180 tablet 3  . Cholecalciferol (VITAMIN D-3) 5000 UNITS TABS Take 5,000 Units by mouth daily.    . diclofenac Sodium (VOLTAREN) 1 % GEL Apply 4 g topically 4 (four) times daily. 100 g 2  . EUTHYROX 50 MCG  tablet TAKE 1 TABLET BY MOUTH ONCE DAILY BEFORE BREAKFAST 30 tablet 10  . ferrous sulfate 325 (65 FE) MG tablet Take 325 mg by mouth 2 (two) times daily.    . furosemide (LASIX) 20 MG tablet TAKE 1 TABLET BY MOUTH  TWICE DAILY 180 tablet 0  . loratadine (CLARITIN) 10 MG tablet Take 10 mg by mouth daily.    Marland Kitchen oxybutynin (DITROPAN-XL) 10 MG 24 hr tablet Take 1 tablet (10 mg total) by mouth at bedtime. 90 tablet 1  . pantoprazole (PROTONIX) 40 MG tablet Take 1 tablet (40 mg total) by mouth 2 (two) times daily before a meal. 180 tablet 1  . simvastatin (ZOCOR) 40 MG tablet TAKE 1 TABLET BY MOUTH IN  THE EVENING 90 tablet 0  . valsartan (DIOVAN) 80 MG tablet Take 1 tablet (80 mg total) by mouth 2 (two) times daily. 180 tablet 3  . ONE TOUCH ULTRA TEST test strip USE UP TO FOUR TIMES DAILY AS DIRECTED 400 each 3     Scheduled Meds: . amiodarone  250 mg Intravenous Once  . diltiazem  10 mg Intravenous Once  . heparin  5,000 Units Subcutaneous Q8H  . hydrALAZINE  5 mg Intravenous Once  . HYDROmorphone      . pantoprazole  40 mg Oral BID AC  . simvastatin  40 mg Oral QPM   Continuous Infusions: . sodium chloride 250 mL (12/25/20 0001)  . amiodarone Stopped (12/24/20 2204)   Followed by  . amiodarone    . diltiazem (CARDIZEM) infusion    . norepinephrine    . norepinephrine (LEVOPHED) Adult infusion 10 mcg/min (12/24/20 2359)   PRN Meds:.docusate sodium, polyethylene glycol  Allergies: No Known Allergies  Family History  Problem Relation Age of Onset  . Colitis Sister 17       alive  . Cancer Sister 27       colon  . Cancer Mother 46       uterine  . Heart disease Father 67       heart failure  . Heart attack Brother 46  . Healthy Daughter   . Healthy Son   . Pulmonary embolism Brother 70  . Heart disease Brother   . Healthy Son   . Healthy Son     Social History:  reports that she has never smoked. She has never used smokeless tobacco. She reports that she does not drink  alcohol and does not use drugs.  ROS: A complete review of systems was performed.  All systems are negative except for pertinent findings as noted.  Physical Exam:  Vital signs in last 24 hours: Temp:  [99.4 F (37.4 C)] 99.4 F (37.4 C) (06/02 1638) Pulse Rate:  [28-141] 120 (06/03 0000) Resp:  [18-37] 26 (06/03 0000) BP: (68-246)/(43-200) 133/114 (06/03 0000) SpO2:  [75 %-100 %] 93 % (06/03 0000) Weight:  [94.8 kg] 94.8 kg (06/02 1636) Constitutional:  Alert and oriented, No acute distress Cardiovascular: irregular rate, tachycardic  Respiratory: Normal respiratory effort, Lungs clear bilaterally GI: Abdomen is soft, nontender, nondistended, no abdominal masses GU: No CVA tenderness Neurologic: Grossly intact, no focal deficits Psychiatric: Normal mood and affect  Laboratory Data:  Recent Labs    12/24/20 1719  WBC 15.6*  HGB 12.9  HCT 38.6  PLT 61*    Recent Labs    12/24/20 1719  NA 133*  K 3.7  CL 101  GLUCOSE 109*  BUN 68*  CALCIUM 10.0  CREATININE 4.26*     Results for orders placed or performed during the hospital encounter of 12/24/20 (from the past 24 hour(s))  Resp Panel by RT-PCR (Flu A&B, Covid) Nasopharyngeal Swab     Status: None   Collection Time: 12/24/20  4:42 PM   Specimen: Nasopharyngeal Swab; Nasopharyngeal(NP) swabs in vial transport medium  Result Value Ref Range   SARS Coronavirus 2 by RT PCR NEGATIVE NEGATIVE   Influenza A by PCR NEGATIVE NEGATIVE   Influenza B by PCR NEGATIVE NEGATIVE  Basic metabolic panel     Status: Abnormal   Collection Time: 12/24/20  5:19 PM  Result Value Ref Range   Sodium 133 (L) 135 - 145 mmol/L   Potassium 3.7 3.5 - 5.1 mmol/L   Chloride 101 98 - 111 mmol/L   CO2 20 (L) 22 - 32 mmol/L   Glucose, Bld 109 (H) 70 - 99 mg/dL   BUN 68 (H) 8 - 23 mg/dL   Creatinine, Ser 4.26 (H) 0.44 - 1.00 mg/dL   Calcium 10.0 8.9 - 10.3 mg/dL   GFR, Estimated 10 (L) >60 mL/min   Anion gap 12 5 - 15  CBC     Status:  Abnormal   Collection Time: 12/24/20  5:19 PM  Result Value Ref Range   WBC 15.6 (H) 4.0 - 10.5 K/uL   RBC 4.07 3.87 - 5.11 MIL/uL   Hemoglobin 12.9 12.0 - 15.0 g/dL   HCT 38.6 36.0 - 46.0 %   MCV 94.8 80.0 - 100.0 fL   MCH 31.7 26.0 - 34.0 pg   MCHC 33.4 30.0 - 36.0 g/dL   RDW 13.9 11.5 - 15.5 %   Platelets 61 (L) 150 - 400 K/uL   nRBC 0.0 0.0 - 0.2 %  Troponin I (High Sensitivity)     Status: Abnormal   Collection Time: 12/24/20  5:19 PM  Result Value Ref Range   Troponin I (High Sensitivity) 280 (HH) <18 ng/L  Hepatic function panel     Status: Abnormal   Collection Time: 12/24/20  6:12 PM  Result Value Ref Range   Total Protein 6.4 (L) 6.5 - 8.1 g/dL   Albumin 3.4 (L) 3.5 - 5.0 g/dL   AST 23 15 - 41 U/L   ALT 19 0 - 44 U/L   Alkaline Phosphatase 123 38 - 126 U/L   Total Bilirubin 1.0 0.3 - 1.2 mg/dL   Bilirubin, Direct 0.3 (H) 0.0 - 0.2 mg/dL   Indirect Bilirubin 0.7 0.3 - 0.9 mg/dL  Lipase, blood     Status: None   Collection Time: 12/24/20  6:12 PM  Result Value Ref Range   Lipase 15 11 - 51 U/L  Lactic acid, plasma     Status: Abnormal   Collection Time: 12/24/20  6:12 PM  Result Value Ref Range   Lactic Acid, Venous 3.4 (HH) 0.5 - 1.9 mmol/L  Protime-INR     Status: Abnormal   Collection Time: 12/24/20  6:12 PM  Result Value Ref Range  Prothrombin Time 24.7 (H) 11.4 - 15.2 seconds   INR 2.2 (H) 0.8 - 1.2  APTT     Status: Abnormal   Collection Time: 12/24/20  6:12 PM  Result Value Ref Range   aPTT 42 (H) 24 - 36 seconds  Troponin I (High Sensitivity)     Status: Abnormal   Collection Time: 12/24/20  7:06 PM  Result Value Ref Range   Troponin I (High Sensitivity) 256 (HH) <18 ng/L  Blood gas, arterial     Status: Abnormal   Collection Time: 12/24/20  9:15 PM  Result Value Ref Range   FIO2 100.00    pH, Arterial 7.261 (L) 7.350 - 7.450   pCO2 arterial 29.4 (L) 32.0 - 48.0 mmHg   pO2, Arterial 160 (H) 83.0 - 108.0 mmHg   Bicarbonate 14.6 (L) 20.0 - 28.0  mmol/L   Acid-base deficit 12.9 (H) 0.0 - 2.0 mmol/L   O2 Saturation 98.4 %   Patient temperature 37.0    Allens test (pass/fail) PASS PASS  Urinalysis, Routine w reflex microscopic Urine, Catheterized     Status: Abnormal   Collection Time: 12/24/20  9:36 PM  Result Value Ref Range   Color, Urine YELLOW YELLOW   APPearance CLOUDY (A) CLEAR   Specific Gravity, Urine 1.013 1.005 - 1.030   pH 5.0 5.0 - 8.0   Glucose, UA NEGATIVE NEGATIVE mg/dL   Hgb urine dipstick LARGE (A) NEGATIVE   Bilirubin Urine NEGATIVE NEGATIVE   Ketones, ur NEGATIVE NEGATIVE mg/dL   Protein, ur 100 (A) NEGATIVE mg/dL   Nitrite NEGATIVE NEGATIVE   Leukocytes,Ua LARGE (A) NEGATIVE   RBC / HPF 21-50 0 - 5 RBC/hpf   WBC, UA >50 (H) 0 - 5 WBC/hpf   Bacteria, UA MANY (A) NONE SEEN   Squamous Epithelial / LPF 0-5 0 - 5   WBC Clumps PRESENT    Mucus PRESENT    Hyaline Casts, UA PRESENT    Recent Results (from the past 240 hour(s))  Resp Panel by RT-PCR (Flu A&B, Covid) Nasopharyngeal Swab     Status: None   Collection Time: 12/24/20  4:42 PM   Specimen: Nasopharyngeal Swab; Nasopharyngeal(NP) swabs in vial transport medium  Result Value Ref Range Status   SARS Coronavirus 2 by RT PCR NEGATIVE NEGATIVE Final    Comment: (NOTE) SARS-CoV-2 target nucleic acids are NOT DETECTED.  The SARS-CoV-2 RNA is generally detectable in upper respiratory specimens during the acute phase of infection. The lowest concentration of SARS-CoV-2 viral copies this assay can detect is 138 copies/mL. A negative result does not preclude SARS-Cov-2 infection and should not be used as the sole basis for treatment or other patient management decisions. A negative result may occur with  improper specimen collection/handling, submission of specimen other than nasopharyngeal swab, presence of viral mutation(s) within the areas targeted by this assay, and inadequate number of viral copies(<138 copies/mL). A negative result must be combined  with clinical observations, patient history, and epidemiological information. The expected result is Negative.  Fact Sheet for Patients:  EntrepreneurPulse.com.au  Fact Sheet for Healthcare Providers:  IncredibleEmployment.be  This test is no t yet approved or cleared by the [place] FDA and  has been authorized for detection and/or diagnosis of SARS-CoV-2 by FDA under an Emergency Use Authorization (EUA). This EUA will remain  in effect (meaning this test can be used) for the duration of the COVID-19 declaration under Section 564(b)(1) of the Act, 21 U.S.C.section 360bbb-3(b)(1), unless the authorization is terminated  or revoked sooner.       Influenza A by PCR NEGATIVE NEGATIVE Final   Influenza B by PCR NEGATIVE NEGATIVE Final    Comment: (NOTE) The Xpert Xpress SARS-CoV-2/FLU/RSV plus assay is intended as an aid in the diagnosis of influenza from Nasopharyngeal swab specimens and should not be used as a sole basis for treatment. Nasal washings and aspirates are unacceptable for Xpert Xpress SARS-CoV-2/FLU/RSV testing.  Fact Sheet for Patients: EntrepreneurPulse.com.au  Fact Sheet for Healthcare Providers: IncredibleEmployment.be  This test is not yet approved or cleared by the [place] FDA and has been authorized for detection and/or diagnosis of SARS-CoV-2 by FDA under an Emergency Use Authorization (EUA). This EUA will remain in effect (meaning this test can be used) for the duration of the COVID-19 declaration under Section 564(b)(1) of the Act, 21 U.S.C. section 360bbb-3(b)(1), unless the authorization is terminated or revoked.  Performed at [facility], [address], [place], [place] [address]     Renal Function: Recent Labs    12/24/20 1719  CREATININE 4.26*   Estimated Creatinine Clearance: 11.7 mL/min (A) (by C-G formula based on SCr of 4.26 mg/dL (H)).  Radiologic  Imaging: CT ABDOMEN PELVIS WO CONTRAST  Result Date: 12/24/2020 CLINICAL DATA:  Abdomen pain with fever EXAM: CT ABDOMEN AND PELVIS WITHOUT CONTRAST TECHNIQUE: Multidetector CT imaging of the abdomen and pelvis was performed following the standard protocol without IV contrast. COMPARISON:  CT 01/22/2020 FINDINGS: Lower chest: Lung bases demonstrate streaky atelectasis. Cardiomegaly. Hepatobiliary: Slightly distended gallbladder. No calcified stone. No focal hepatic abnormality or biliary dilatation. Pancreas: Unremarkable. No pancreatic ductal dilatation or surrounding inflammatory changes. Spleen: Normal in size without focal abnormality. Adrenals/Urinary Tract: Adrenal glands are normal. Multiple low-density lesions in the kidneys consistent with cysts. Moderate right perinephric fat stranding. Mild right hydronephrosis, secondary to a 7 x 10 mm stone in the proximal right ureter just past the UPJ. Bladder is unremarkable. There are bilateral intrarenal stones. Small cortical hyperdensity within the lower pole left kidney too small to further characterize. Stomach/Bowel: The stomach is nonenlarged. No dilated small bowel. Negative appendix. Diverticular disease of the sigmoid colon. Vascular/Lymphatic: Moderate aortic atherosclerosis. No aneurysm. No suspicious nodes. Reproductive: Status post hysterectomy. No adnexal masses. Other: Negative for free air. Trace fluid within the bilateral colic gutters. Right retroperitoneal edema and small fluid presumably due to obstructive kidney disease. Musculoskeletal: Bilateral hip replacements. Grade 1 anterolisthesis L3 on L4 and L4 on L5. Diffuse degenerative changes. No acute osseous abnormality IMPRESSION: 1. Mild right hydronephrosis, secondary to a 7 x 10 mm stone in the proximal right ureter just distal to the UPJ. 2. There are intrarenal stones bilaterally 3. There is gallbladder distension without inflammatory change or calcified stone. Electronically Signed    By: Donavan Foil M.D.   On: 12/24/2020 19:49   DG Chest 2 View  Result Date: 12/24/2020 CLINICAL DATA:  Shortness of breath, chest pain EXAM: CHEST - 2 VIEW COMPARISON:  04/03/2019 FINDINGS: Cardiomegaly. Aortic atherosclerosis. No confluent opacities or effusions. No acute bony abnormality. IMPRESSION: Mild cardiomegaly.  No active disease. Electronically Signed   By: Rolm Baptise M.D.   On: 12/24/2020 17:10   DG Chest Port 1 View  Result Date: 12/24/2020 CLINICAL DATA:  Shortness of breath and chest pain. EXAM: PORTABLE CHEST 1 VIEW COMPARISON:  Radiograph earlier today. Lung bases from abdominal CT earlier today. FINDINGS: Lung volumes are low. Similar cardiomegaly increasing bibasilar atelectasis. No pulmonary edema, large pleural effusion, or pneumothorax. Stable osseous  structures. IMPRESSION: Low lung volumes with increasing bibasilar atelectasis from earlier today. Electronically Signed   By: Keith Rake M.D.   On: 12/24/2020 20:57    I independently reviewed the above imaging studies.  Impression/Recommendation: 1. Right proximal ureteral stone causing right hydroureteronephrosis in setting of sepsis and AKI: CT A/P 12/24/2020 with 10 x 7 mm right proximal ureteral stone with mild right hydronephrosis.  Also seen with approximately 3 mm right lower pole renal stone and punctate left renal stones.  Afebrile, urinalysis with possible infection, AKI with creatinine 4.2, hypotensive on pressor support. 2. Right renal obstruction from right ureteral stone 3. AKI: Creatinine 4.2 from baseline of 1 4. UTI: Urinalysis with likely infection. Urine culture pending. 5. Atrial fibrillation: She was given Lopressor, Cardizem and started on amiodarone drip 6. Hypotension 7. Elevated troponin  -Initially discussed this case with anesthesiology on-call Forestine Na who recommended transfer down to [facility] or [facility] given her cardiac issues with elevated troponin.  I discussed with  anesthesiology at [facility] who thinks that she is appropriate to undergo right ureteral stent placement tonight. -Continue Rocephin -She will be admitted by critical care.  Cardiology has also been consulted for her atrial fibrillation and elevated troponin. -To OR tonight for cystoscopy, right retrograde pyelogram, right ureteral stent placement.  -She will need follow-up for definitive management of her right ureteral and bilateral renal stones after recovering from acute issues. -The risks, benefits and alternatives of cystoscopy with right JJ stent placement was discussed with the patient.  Risks include, but are not limited to: bleeding, urinary tract infection, ureteral injury, ureteral stricture disease, chronic pain, urinary symptoms, bladder injury, stent migration, the need for nephrostomy tube placement, MI, CVA, DVT, PE and the inherent risks with general anesthesia.  The patient voices understanding and wishes to proceed.   Matt R. Kean Gautreau MD 12/25/2020, 12:15 AM  [facility]  Pager: [phone]   CC: Milton Ferguson, MD

## 2020-12-26 DIAGNOSIS — R6521 Severe sepsis with septic shock: Secondary | ICD-10-CM | POA: Diagnosis not present

## 2020-12-26 DIAGNOSIS — A419 Sepsis, unspecified organism: Secondary | ICD-10-CM | POA: Diagnosis not present

## 2020-12-26 LAB — GLUCOSE, CAPILLARY
Glucose-Capillary: 95 mg/dL (ref 70–99)
Glucose-Capillary: 99 mg/dL (ref 70–99)
Glucose-Capillary: 96 mg/dL (ref 70–99)
Glucose-Capillary: 86 mg/dL (ref 70–99)
Glucose-Capillary: 101 mg/dL — ABNORMAL HIGH (ref 70–99)

## 2020-12-26 LAB — BASIC METABOLIC PANEL
Anion gap: 12 (ref 5–15)
Anion gap: 12 (ref 5–15)
BUN: 73 mg/dL — ABNORMAL HIGH (ref 8–23)
BUN: 72 mg/dL — ABNORMAL HIGH (ref 8–23)
CO2: 21 mmol/L — ABNORMAL LOW (ref 22–32)
CO2: 23 mmol/L (ref 22–32)
Calcium: 8.4 mg/dL — ABNORMAL LOW (ref 8.9–10.3)
Calcium: 8.4 mg/dL — ABNORMAL LOW (ref 8.9–10.3)
Chloride: 101 mmol/L (ref 98–111)
Chloride: 99 mmol/L (ref 98–111)
Creatinine, Ser: 3.91 mg/dL — ABNORMAL HIGH (ref 0.44–1.00)
Creatinine, Ser: 3.58 mg/dL — ABNORMAL HIGH (ref 0.44–1.00)
GFR, Estimated: 11 mL/min — ABNORMAL LOW (ref >60)
GFR, Estimated: 12 mL/min — ABNORMAL LOW (ref >60)
Glucose, Bld: 91 mg/dL (ref 70–99)
Glucose, Bld: 96 mg/dL (ref 70–99)
Potassium: 3.5 mmol/L (ref 3.5–5.1)
Potassium: 3.3 mmol/L — ABNORMAL LOW (ref 3.5–5.1)
Sodium: 134 mmol/L — ABNORMAL LOW (ref 135–145)
Sodium: 134 mmol/L — ABNORMAL LOW (ref 135–145)

## 2020-12-26 LAB — CBC
HCT: 30.3 % — ABNORMAL LOW (ref 36.0–46.0)
Hemoglobin: 10.3 g/dL — ABNORMAL LOW (ref 12.0–15.0)
MCH: 30.7 pg (ref 26.0–34.0)
MCHC: 34 g/dL (ref 30.0–36.0)
MCV: 90.4 fL (ref 80.0–100.0)
Platelets: 45 10*3/uL — ABNORMAL LOW (ref 150–400)
RBC: 3.35 MIL/uL — ABNORMAL LOW (ref 3.87–5.11)
RDW: 13.8 % (ref 11.5–15.5)
WBC: 13.3 10*3/uL — ABNORMAL HIGH (ref 4.0–10.5)
nRBC: 0 % (ref 0.0–0.2)

## 2020-12-26 LAB — TRIGLYCERIDES: Triglycerides: 193 mg/dL — ABNORMAL HIGH (ref <150)

## 2020-12-26 LAB — HEMOGLOBIN A1C
Hgb A1c MFr Bld: 5.7 % — ABNORMAL HIGH (ref 4.8–5.6)
Mean Plasma Glucose: 117 mg/dL

## 2020-12-26 MED ORDER — ACETAMINOPHEN 325 MG PO TABS
650.0000 mg | ORAL_TABLET | Freq: Four times a day (QID) | ORAL | Status: DC | PRN
  Administered 2020-12-26 – 2020-12-28 (×4): 650 mg via ORAL
  Filled 2020-12-26 (×4): qty 2

## 2020-12-26 MED ORDER — SIMVASTATIN 40 MG PO TABS
40.0000 mg | ORAL_TABLET | Freq: Every evening | ORAL | Status: DC
  Administered 2020-12-26: 40 mg via ORAL
  Filled 2020-12-26: qty 1

## 2020-12-26 MED ORDER — APIXABAN 2.5 MG PO TABS: 2.5000 mg | ORAL_TABLET | Freq: Two times a day (BID) | ORAL | Status: DC

## 2020-12-26 MED ORDER — POLYETHYLENE GLYCOL 3350 17 G PO PACK: 17.0000 g | PACK | Freq: Every day | ORAL | Status: DC | PRN

## 2020-12-26 MED ORDER — LACTATED RINGERS IV SOLN: INTRAVENOUS | Status: DC

## 2020-12-26 MED ORDER — DOCUSATE SODIUM 100 MG PO CAPS: 100.0000 mg | ORAL_CAPSULE | Freq: Two times a day (BID) | ORAL | Status: DC | PRN

## 2020-12-26 NOTE — Evaluation (Signed)
Physical Therapy Evaluation Patient Details Name: [name] [name] MRN: [id number] DOB: 1940-12-19 Today's Date: 12/26/2020   History of Present Illness  80 yo female admitted with septic shock. S/P ureteral stent placement 6/3. Hx of OA, R THA-DA 2015, L THA 2012, obesity, DM, DVT, PE, gout, Afib.  Clinical Impression  On eval, pt required Mod A +2 for mobility. She was able to perform sit to stand transition x 3 before taking a few steps over to the recliner using the RW. Pt presents with general weakness, decreased activity tolerance, and impaired gait and balance. D/C plan is dependent on pt's continued progress and assistance that will be available to her at home. If family can provide 24/7 supervision/assist, pt could potentially return home. Will plan to follow and progress activity as tolerated.     Follow Up Recommendations SNF;Home health PT;Supervision/Assistance - 24 hour (depending on progress and assist available at home)    Equipment Recommendations  None recommended by PT    Recommendations for Other Services OT consult     Precautions / Restrictions Precautions Precautions: Fall Restrictions Weight Bearing Restrictions: No      Mobility  Bed Mobility Overal bed mobility: Needs Assistance Bed Mobility: Supine to Sit     Supine to sit: Min assist;+2 for physical assistance;+2 for safety/equipment;HOB elevated     General bed mobility comments: Assist for trunk and bil LEs. Increased time. Utilized bedpad to aid with scooting, positioning. Cues for safety, technique.    Transfers Overall transfer level: Needs assistance Equipment used: Rolling walker (2 wheeled) Transfers: Sit to/from [facility] Sit to Stand: Mod assist;+2 physical assistance;+2 safety/equipment;From elevated surface Stand pivot transfers: Min assist;+2 physical assistance;+2 safety/equipment       General transfer comment: Sit to stand x 3. Cues for safety, technique,  hand plaement. Stand pivot, bed to recliner, using RW. Increased time. Remained on [place] O2.  Ambulation/Gait             General Gait Details: NT-not quite ready to attempt yet  Stairs            Wheelchair Mobility    Modified Rankin (Stroke Patients Only)       Balance Overall balance assessment: Needs assistance         Standing balance support: Bilateral upper extremity supported Standing balance-Leahy Scale: Poor                               Pertinent Vitals/Pain Pain Assessment: Faces Faces Pain Scale: Hurts even more Pain Location: "all over" Pain Descriptors / Indicators: Grimacing;Discomfort Pain Intervention(s): Limited activity within patient's tolerance;Monitored during session;Repositioned    Home Living Family/patient expects to be discharged to:: Private residence Living Arrangements: Children Available Help at Discharge: Family Type of Home: House Home Access: Ramped entrance     Home Layout: One level Home Equipment: Environmental consultant - 2 wheels;Wheelchair - manual;Cane - single point;Bedside commode      Prior Function Level of Independence: Needs assistance   Gait / Transfers Assistance Needed: uses RW for ambulation  ADL's / Homemaking Assistance Needed: lives with son who assists as needed.        Hand Dominance        Extremity/Trunk Assessment   Upper Extremity Assessment Upper Extremity Assessment: Defer to OT evaluation    Lower Extremity Assessment Lower Extremity Assessment: Generalized weakness    Cervical / Trunk Assessment Cervical / Trunk Assessment: Normal  Communication   Communication: No difficulties  Cognition Arousal/Alertness: Awake/alert Behavior During Therapy: WFL for tasks assessed/performed Overall Cognitive Status: Within Functional Limits for tasks assessed                                 General Comments: a bit drowsy      General Comments      Exercises General  Exercises - Lower Extremity Long Arc Quad: AROM;Both;5 reps;Seated   Assessment/Plan    PT Assessment Patient needs continued PT services  PT Problem List Decreased strength;Decreased range of motion;Decreased mobility;Decreased activity tolerance;Decreased balance;Pain;Decreased knowledge of use of DME       PT Treatment Interventions DME instruction;Gait training;Therapeutic exercise;Balance training;Functional mobility training;Therapeutic activities;Patient/family education    PT Goals (Current goals can be found in the Care Plan section)  Acute Rehab PT Goals Patient Stated Goal: none stated PT Goal Formulation: With patient Time For Goal Achievement: 01/09/21 Potential to Achieve Goals: Good    Frequency Min 3X/week   Barriers to discharge        Co-evaluation               AM-PAC PT "6 Clicks" Mobility  Outcome Measure Help needed turning from your back to your side while in a flat bed without using bedrails?: A Lot Help needed moving from lying on your back to sitting on the side of a flat bed without using bedrails?: A Lot Help needed moving to and from a bed to a chair (including a wheelchair)?: A Lot Help needed standing up from a chair using your arms (e.g., wheelchair or bedside chair)?: A Lot Help needed to walk in hospital room?: Total Help needed climbing 3-5 steps with a railing? : Total 6 Click Score: 10    End of Session Equipment Utilized During Treatment: Oxygen Activity Tolerance: Patient tolerated treatment well Patient left: in chair;with call bell/phone within reach;with chair alarm set Nurse Communication: Mobility status PT Visit Diagnosis: Muscle weakness (generalized) (M62.81);Pain    Time: 7672-0947 PT Time Calculation (min) (ACUTE ONLY): 25 min   Charges:   PT Evaluation $PT Eval Moderate Complexity: 1 Mod PT Treatments $Gait Training: 8-22 mins           Doreatha Massed, PT Acute Rehabilitation  Office: [phone] Pager:  [phone]

## 2020-12-26 NOTE — Progress Notes (Signed)
NAME:  [name] [name], MRN:  [id number], DOB:  08-13-1940, LOS: 1 ADMISSION DATE:  12/24/2020, CONSULTATION DATE:   12/25/20 REFERRING MD:  Freddi Starr, MD, CHIEF COMPLAINT:  Flank pain  Brief Narrative:  [name] [name] is a 80 y.o. woman who presented to [facility] ED with right sided flank pain and decreased oral intake secondary to urosepsis with ureteral stone with hydronephrosis on CT A/P.   Pertinent  Medical History  HTN Gout PE unprovoked on AC DM2 not on insulin  Significant Hospital Events: Including procedures, antibiotic start and stop dates in addition to other pertinent events   . 6/2 admitted with right sided flank pain and decreased oral intake secondary to urosepsis with ureteral stone with hydronephrosis on CT A/P. Developed A-fib RVR at [facility] ED became hypotensive with Cardizem improved with IV Amio .  6/3 s/p R ureteral stent    Scheduled Meds: . amiodarone  250 mg Intravenous Once  . chlorhexidine  15 mL Mouth Rinse BID  . Chlorhexidine Gluconate Cloth  6 each Topical Daily  . hydrALAZINE  5 mg Intravenous Once  . insulin aspart  0-15 Units Subcutaneous Q4H  . mouth rinse  15 mL Mouth Rinse q12n4p  . pantoprazole (PROTONIX) IV  40 mg Intravenous Q24H  . simvastatin  40 mg Per Tube QPM   Continuous Infusions: . sodium chloride Stopped (12/25/20 1542)  . acetaminophen    . amiodarone 30 mg/hr (12/26/20 0400)  . cefTRIAXone (ROCEPHIN)  IV Stopped (12/25/20 2027)  . sodium bicarbonate 150 mEq in D5W infusion 75 mL/hr at 12/26/20 0400   PRN Meds:.acetaminophen, docusate, HYDROmorphone (DILAUDID) injection, polyethylene glycol    Interim History / Subjective:  Much improved s/p stent  - no pressors/ minimal 02 rx   Objective   Blood pressure 127/79, pulse (!) 104, temperature 98.6 F (37 C), temperature source Oral, resp. rate 19, height 5\' 3"  (1.6 m), weight 99.6 kg, SpO2 97 %.        Intake/Output Summary (Last 24 hours) at 12/26/2020 0900 Last data  filed at 12/26/2020 0400 Gross per 24 hour  Intake 2710.09 ml  Output 145 ml  Net 2565.09 ml   Filed Weights   12/24/20 1636 12/25/20 0433 12/26/20 0346  Weight: 94.8 kg 99.6 kg 99.6 kg    Examination:  Tmax  99.8 General appearance:   Sleepy but arousable  At Rest 02 sats  97% on 1lpm   No jvd Oropharynx clear,  mucosa nl Neck supple Lungs clear bilaterally IRIR  Rate around 100 on amiodarone  Abd obese soft nl excursion  Extr warm with no edema or clubbing noted Neuro  Sensorium sleepy but oriented x 4,  no apparent motor deficits   Labs/imaging that I have personally reviewed     Resolved Hospital Problem list     Assessment & Plan:  Septic Shock secondary to UTI -Patient presented with tachypnea, hypotensive, tachycardia, leukocytosis, and lactic acidosis  with acute concern for infection meeting criteria for septic shock >> resolved am 6/4   Obstructed, infected, ureteral stone - 6/3 early am  cystoscopy, ureteral stent placement with urology - cultures from bc growing enterobacter and e coli from 6/2  P:  >>>  Continue Ceftriaxone - check sensitivities  w/a    Multifactorial AKI from infection, obstruction and hypovolemia Lab Results  Component Value Date   CREATININE 3.58 (H) 12/26/2020   CREATININE 3.91 (H) 12/26/2020   CREATININE 3.83 (H) 12/25/2020    Non-anion gap  metabolic acidosis - suspect from azotemia, maybe some non gap RTA P: Follow renal function  Monitor urine output Trend Bmet Avoid nephrotoxins Ensure adequate renal perfusion  IV hydration   D/c  Bicarb drip 6/4   Atrial Fibrillation with RVR Elevated Troponin - downtrending  - likely driven by sepsis, demand ischemia P: Continuous telemetry  Continue IV amiodarone     Acute hypoxemic respiratory failure -- min 02 requirements am 6/4  P:  >>>  Wean off for sats > 92%    Unprovoked VTE on Eliquis in 2020 P: Continue home Eliquis   HTN -Home medications include Norvasc,  Lasix,  P: Home medications remains on hold  Continuous telemetry   DM2 P: Monitor CBG currently well controlled with no need of SSI   Gout P: Home Allopurinol on hold  Hypothyroidism  P: Continue home Euthyrox    Best practice   Diet:  Advance as tol  Pain/Anxiety/Delirium protocol (if indicated): none VAP protocol (if indicated): Not indicated DVT prophylaxis: Systemic AC GI prophylaxis: H2B Glucose control:  SSI No Central venous access:  N/A Arterial line:  N/A Foley:  Yes, and it is still needed Mobility:  bed rest  PT consulted: N/A Last date of multidisciplinary goals of care discussion Update family daily  Code Status:  full code Disposition: ICU   can go to [facility] for am 6/5   Christinia Gully, MD Pulmonary and [place] [phone]   After 7:00 pm call Elink  [phone]

## 2020-12-26 NOTE — Progress Notes (Signed)
eLink Physician-Brief Progress Note Patient Name: [name] [name] DOB: Oct 27, 1940 MRN: [id number]   Date of Service  12/26/2020  HPI/Events of Note  Hypokalemia - K+ = 3.5 and Creatinine = 3.91.   eICU Interventions  Will not correct K+ further given renal dysfunction.      Intervention Category Major Interventions: Electrolyte abnormality - evaluation and management  Caelyn Route [place] 12/26/2020, 5:21 AM

## 2020-12-26 NOTE — Progress Notes (Signed)
Urology Inpatient Progress Report  Septic shock ([place]) [A41.9, R65.21]  Procedure(s): CYSTOSCOPY WITH RETROGRADE PYELOGRAM/URETERAL STENT PLACEMENT  1 Day Post-Op   Intv/Subj: The patient is sitting up in a chair this afternoon.  She is interactive and seems to be feeling significantly better.  She denies any pain this afternoon. The patient had a renal ultrasound yesterday evening that demonstrated complete resolution of her right hydronephrosis. Active Problems:   Septic shock (HCC)  Current Facility-Administered Medications  Medication Dose Route Frequency Provider Last Rate Last Admin  . 0.9 %  sodium chloride infusion  250 mL Intravenous Continuous Milton Ferguson, MD   Stopped at 12/25/20 1542  . acetaminophen (OFIRMEV) IV 1,000 mg  1,000 mg Intravenous Q8H PRN Freddi Starr, MD      . amiodarone (NEXTERONE PREMIX) 360-4.14 MG/200ML-% (1.8 mg/mL) IV infusion  30 mg/hr Intravenous Continuous Milton Ferguson, MD 16.67 mL/hr at 12/26/20 0916 30 mg/hr at 12/26/20 0916  . amiodarone (NEXTERONE) 1.8 mg/mL load via infusion 250 mg  250 mg Intravenous Once Milton Ferguson, MD      . cefTRIAXone (ROCEPHIN) 2 g in sodium chloride 0.9 % 100 mL IVPB  2 g Intravenous Q24H Merlene Laughter F, NP   Stopped at 12/25/20 2027  . chlorhexidine (PERIDEX) 0.12 % solution 15 mL  15 mL Mouth Rinse BID Freddi Starr, MD   15 mL at 12/26/20 0916  . Chlorhexidine Gluconate Cloth 2 % PADS 6 each  6 each Topical Daily Spero Geralds, MD   6 each at 12/25/20 2200  . docusate (COLACE) 50 MG/5ML liquid 100 mg  100 mg Per Tube BID PRN Spero Geralds, MD      . hydrALAZINE (APRESOLINE) injection 5 mg  5 mg Intravenous Once Milton Ferguson, MD      . HYDROmorphone (DILAUDID) injection 0.5 mg  0.5 mg Intravenous Q4H PRN Freddi Starr, MD      . insulin aspart (novoLOG) injection 0-15 Units  0-15 Units Subcutaneous Q4H Spero Geralds, MD   2 Units at 12/25/20 2013  . lactated ringers infusion   Intravenous  Continuous Tanda Rockers, MD 100 mL/hr at 12/26/20 1043 New Bag at 12/26/20 1043  . MEDLINE mouth rinse  15 mL Mouth Rinse q12n4p Freddi Starr, MD   15 mL at 12/25/20 1700  . pantoprazole (PROTONIX) injection 40 mg  40 mg Intravenous Q24H Anders Simmonds, MD   40 mg at 12/25/20 2239  . polyethylene glycol (MIRALAX / GLYCOLAX) packet 17 g  17 g Per Tube Daily PRN Spero Geralds, MD      . simvastatin (ZOCOR) tablet 40 mg  40 mg Per Tube QPM Spero Geralds, MD         Objective: Vital: Vitals:   12/26/20 0700 12/26/20 0800 12/26/20 0900 12/26/20 1200  BP: 127/79 113/79 (!) 88/58   Pulse: (!) 104 (!) 104 (!) 105   Resp: 19 18 (!) 26   Temp: 98.6 F (37 C)   98.9 F (37.2 C)  TempSrc: Oral   Oral  SpO2: 97% 96% 96%   Weight:      Height:       I/Os: I/O last 3 completed shifts: In: 4278 [I.V.:3015.9; IV Piggyback:1262.1] Out: 64 [Urine:895]  Physical Exam:  General: Patient is in no apparent distress Lungs: Normal respiratory effort, chest expands symmetrically. GI: The abdomen is soft and nontender without mass. Ext: lower extremities symmetric  Lab Results: Recent Labs  12/24/20 1719 12/25/20 0253 12/26/20 0500  WBC 15.6* 21.0* 13.3*  HGB 12.9 12.6 10.3*  HCT 38.6 38.5 30.3*   Recent Labs    12/25/20 2034 12/26/20 0041 12/26/20 0500  NA 135 134* 134*  K 3.0* 3.5 3.3*  CL 101 101 99  CO2 22 21* 23  GLUCOSE 120* 91 96  BUN 75* 73* 72*  CREATININE 3.83* 3.91* 3.58*  CALCIUM 8.4* 8.4* 8.4*   Recent Labs    12/24/20 1812  INR 2.2*   No results for input(s): LABURIN in the last 72 hours. Results for orders placed or performed during the hospital encounter of 12/24/20  Resp Panel by RT-PCR (Flu A&B, Covid) Nasopharyngeal Swab     Status: None   Collection Time: 12/24/20  4:42 PM   Specimen: Nasopharyngeal Swab; Nasopharyngeal(NP) swabs in vial transport medium  Result Value Ref Range Status   SARS Coronavirus 2 by RT PCR NEGATIVE NEGATIVE  Final    Comment: (NOTE) SARS-CoV-2 target nucleic acids are NOT DETECTED.  The SARS-CoV-2 RNA is generally detectable in upper respiratory specimens during the acute phase of infection. The lowest concentration of SARS-CoV-2 viral copies this assay can detect is 138 copies/mL. A negative result does not preclude SARS-Cov-2 infection and should not be used as the sole basis for treatment or other patient management decisions. A negative result may occur with  improper specimen collection/handling, submission of specimen other than nasopharyngeal swab, presence of viral mutation(s) within the areas targeted by this assay, and inadequate number of viral copies(<138 copies/mL). A negative result must be combined with clinical observations, patient history, and epidemiological information. The expected result is Negative.  Fact Sheet for Patients:  EntrepreneurPulse.com.au  Fact Sheet for Healthcare Providers:  IncredibleEmployment.be  This test is no t yet approved or cleared by the [place] FDA and  has been authorized for detection and/or diagnosis of SARS-CoV-2 by FDA under an Emergency Use Authorization (EUA). This EUA will remain  in effect (meaning this test can be used) for the duration of the COVID-19 declaration under Section 564(b)(1) of the Act, 21 U.S.C.section 360bbb-3(b)(1), unless the authorization is terminated  or revoked sooner.       Influenza A by PCR NEGATIVE NEGATIVE Final   Influenza B by PCR NEGATIVE NEGATIVE Final    Comment: (NOTE) The Xpert Xpress SARS-CoV-2/FLU/RSV plus assay is intended as an aid in the diagnosis of influenza from Nasopharyngeal swab specimens and should not be used as a sole basis for treatment. Nasal washings and aspirates are unacceptable for Xpert Xpress SARS-CoV-2/FLU/RSV testing.  Fact Sheet for Patients: EntrepreneurPulse.com.au  Fact Sheet for Healthcare  Providers: IncredibleEmployment.be  This test is not yet approved or cleared by the [place] FDA and has been authorized for detection and/or diagnosis of SARS-CoV-2 by FDA under an Emergency Use Authorization (EUA). This EUA will remain in effect (meaning this test can be used) for the duration of the COVID-19 declaration under Section 564(b)(1) of the Act, 21 U.S.C. section 360bbb-3(b)(1), unless the authorization is terminated or revoked.  Performed at [facility], [address] [place], [place] [address]   Blood culture (routine single)     Status: Abnormal (Preliminary result)   Collection Time: 12/24/20  6:12 PM   Specimen: BLOOD  Result Value Ref Range Status   Specimen Description   Final    BLOOD BOTTLES DRAWN AEROBIC AND ANAEROBIC Blood Culture adequate volume RIGHT ANTECUBITAL Performed at [facility], [address], [place], [place] [address]  Special Requests   Final    NONE Performed at [facility], [address], [place], [place] [address]    Culture  Setup Time   Final    GRAM NEGATIVE RODS BOTH AEROBIC AND ANAEROBIC BOTTLES Gram Stain Report Called to,Read Back By and Verified With: A KOONTZ @ [place] [facility] 12/25/20 @ 0849 BY S BEARD. CRITICAL RESULT CALLED TO, READ BACK BY AND VERIFIED WITHMelodye Ped [facility] 8341 12/25/20 A BROWNING    Culture (A)  Final    ESCHERICHIA COLI SUSCEPTIBILITIES TO FOLLOW Performed at [facility], [place] [address], [place], [place] [address]    Report Status PENDING  Incomplete  Blood Culture ID Panel (Reflexed)     Status: Abnormal   Collection Time: 12/24/20  6:12 PM  Result Value Ref Range Status   Enterococcus faecalis NOT DETECTED NOT DETECTED Final   Enterococcus Faecium NOT DETECTED NOT DETECTED Final   Listeria monocytogenes NOT DETECTED NOT DETECTED Final   Staphylococcus species NOT DETECTED NOT DETECTED Final   Staphylococcus aureus (BCID) NOT DETECTED NOT DETECTED Final   Staphylococcus  epidermidis NOT DETECTED NOT DETECTED Final   Staphylococcus lugdunensis NOT DETECTED NOT DETECTED Final   Streptococcus species NOT DETECTED NOT DETECTED Final   Streptococcus agalactiae NOT DETECTED NOT DETECTED Final   Streptococcus pneumoniae NOT DETECTED NOT DETECTED Final   Streptococcus pyogenes NOT DETECTED NOT DETECTED Final   A.calcoaceticus-baumannii NOT DETECTED NOT DETECTED Final   Bacteroides fragilis NOT DETECTED NOT DETECTED Final   Enterobacterales DETECTED (A) NOT DETECTED Final    Comment: Enterobacterales represent a large order of gram negative bacteria, not a single organism. CRITICAL RESULT CALLED TO, READ BACK BY AND VERIFIED WITH: Melodye Ped PHARMD [id number] 12/25/20 A BROWNING    Enterobacter cloacae complex NOT DETECTED NOT DETECTED Final   Escherichia coli DETECTED (A) NOT DETECTED Final    Comment: CRITICAL RESULT CALLED TO, READ BACK BY AND VERIFIED WITH: Melodye Ped PHARMD [id number] 12/25/20 A BROWNING    Klebsiella aerogenes NOT DETECTED NOT DETECTED Final   Klebsiella oxytoca NOT DETECTED NOT DETECTED Final   Klebsiella pneumoniae NOT DETECTED NOT DETECTED Final   Proteus species NOT DETECTED NOT DETECTED Final   Salmonella species NOT DETECTED NOT DETECTED Final   Serratia marcescens NOT DETECTED NOT DETECTED Final   Haemophilus influenzae NOT DETECTED NOT DETECTED Final   Neisseria meningitidis NOT DETECTED NOT DETECTED Final   Pseudomonas aeruginosa NOT DETECTED NOT DETECTED Final   Stenotrophomonas maltophilia NOT DETECTED NOT DETECTED Final   Candida albicans NOT DETECTED NOT DETECTED Final   Candida auris NOT DETECTED NOT DETECTED Final   Candida glabrata NOT DETECTED NOT DETECTED Final   Candida krusei NOT DETECTED NOT DETECTED Final   Candida parapsilosis NOT DETECTED NOT DETECTED Final   Candida tropicalis NOT DETECTED NOT DETECTED Final   Cryptococcus neoformans/gattii NOT DETECTED NOT DETECTED Final   CTX-M ESBL NOT DETECTED NOT DETECTED Final   Carbapenem  resistance IMP NOT DETECTED NOT DETECTED Final   Carbapenem resistance KPC NOT DETECTED NOT DETECTED Final   Carbapenem resistance NDM NOT DETECTED NOT DETECTED Final   Carbapenem resist OXA 48 LIKE NOT DETECTED NOT DETECTED Final   Carbapenem resistance VIM NOT DETECTED NOT DETECTED Final    Comment: Performed at [facility], [address] [place], [place] [address]  Urine culture     Status: Abnormal (Preliminary result)   Collection Time: 12/24/20  9:36 PM   Specimen: In/Out Cath Urine  Result Value Ref Range Status   Specimen Description   Final    IN/OUT CATH URINE Performed at [facility], [address] [place], [place] [address]    Special Requests   Final    NONE Performed at [facility], [address] [place], [place] [address]    Culture (A)  Final    50,000 COLONIES/mL ESCHERICHIA COLI SUSCEPTIBILITIES TO FOLLOW Performed at [facility], [place] [address], [place], [place] [address]    Report Status PENDING  Incomplete  MRSA PCR Screening     Status: None   Collection Time: 12/25/20  2:17 AM   Specimen: Nasopharyngeal  Result Value Ref Range Status   MRSA by PCR NEGATIVE NEGATIVE Final    Comment:        The GeneXpert MRSA Assay (FDA approved for NASAL specimens only), is one component of a comprehensive MRSA colonization surveillance program. It is not intended to diagnose MRSA infection nor to guide or monitor treatment for MRSA infections. Performed at [facility], [place] [address] [place], [place] [address]     Studies/Results: Today, independently reviewed the patient's renal ultrasound and went over it with her and her son.  This demonstrated complete resolution of the right hydronephrosis. CT ABDOMEN PELVIS WO CONTRAST  Result Date: 12/24/2020 CLINICAL DATA:  Abdomen pain with fever EXAM: CT ABDOMEN AND PELVIS WITHOUT CONTRAST TECHNIQUE: Multidetector CT imaging of the abdomen and pelvis was performed following the  standard protocol without IV contrast. COMPARISON:  CT 01/22/2020 FINDINGS: Lower chest: Lung bases demonstrate streaky atelectasis. Cardiomegaly. Hepatobiliary: Slightly distended gallbladder. No calcified stone. No focal hepatic abnormality or biliary dilatation. Pancreas: Unremarkable. No pancreatic ductal dilatation or surrounding inflammatory changes. Spleen: Normal in size without focal abnormality. Adrenals/Urinary Tract: Adrenal glands are normal. Multiple low-density lesions in the kidneys consistent with cysts. Moderate right perinephric fat stranding. Mild right hydronephrosis, secondary to a 7 x 10 mm stone in the proximal right ureter just past the UPJ. Bladder is unremarkable. There are bilateral intrarenal stones. Small cortical hyperdensity within the lower pole left kidney too small to further characterize. Stomach/Bowel: The stomach is nonenlarged. No dilated small bowel. Negative appendix. Diverticular disease of the sigmoid colon. Vascular/Lymphatic: Moderate aortic atherosclerosis. No aneurysm. No suspicious nodes. Reproductive: Status post hysterectomy. No adnexal masses. Other: Negative for free air. Trace fluid within the bilateral colic gutters. Right retroperitoneal edema and small fluid presumably due to obstructive kidney disease. Musculoskeletal: Bilateral hip replacements. Grade 1 anterolisthesis L3 on L4 and L4 on L5. Diffuse degenerative changes. No acute osseous abnormality IMPRESSION: 1. Mild right hydronephrosis, secondary to a 7 x 10 mm stone in the proximal right ureter just distal to the UPJ. 2. There are intrarenal stones bilaterally 3. There is gallbladder distension without inflammatory change or calcified stone. Electronically Signed   By: Donavan Foil M.D.   On: 12/24/2020 19:49   DG Chest 2 View  Result Date: 12/24/2020 CLINICAL DATA:  Shortness of breath, chest pain EXAM: CHEST - 2 VIEW COMPARISON:  04/03/2019 FINDINGS: Cardiomegaly. Aortic atherosclerosis. No  confluent opacities or effusions. No acute bony abnormality. IMPRESSION: Mild cardiomegaly.  No active disease. Electronically Signed   By: Rolm Baptise M.D.   On: 12/24/2020 17:10   DG Abd 1 View  Result Date: 12/25/2020 CLINICAL DATA:  Enteric catheter placement EXAM: ABDOMEN - 1 VIEW COMPARISON:  12/24/2020 FINDINGS: Frontal view of the lower chest and upper abdomen demonstrates enteric catheter passing below diaphragm, tip projecting  over the gastric body. Paucity of bowel gas. Stable enlarged cardiac silhouette. IMPRESSION: 1. Enteric catheter tip projecting over the gastric body. Electronically Signed   By: Randa Ngo M.D.   On: 12/25/2020 03:09   US RENAL  Result Date: 12/25/2020 CLINICAL DATA:  Acute renal failure EXAM: RENAL / URINARY TRACT ULTRASOUND COMPLETE COMPARISON:  CT 12/24/2020, sonogram 01/08/2020 FINDINGS: Right Kidney: Renal measurements: 11.3 x 6.7 x 5.6 cm = volume: 219 mL. Renal cortical thickness is preserved and cortical echogenicity is within normal limits. Previously noted hydronephrosis has resolved. No intrarenal masses or calcifications are seen. Multiple simple cortical cysts are identified measuring up to 18 mm with interpolar region. Left Kidney: Renal measurements: 11.1 x 5.4 x 5.1 cm = volume: 161 mL. Renal cortical thickness is preserved and cortical echogenicity is within normal limits. No intrarenal masses or calcifications are seen. Multiple simple cortical cysts are identified measuring up to 6.7 cm in greatest dimension within the lower pole. No hydronephrosis. Bladder: The bladder is decompressed with a Foley catheter balloon seen within its lumen. Other: None. IMPRESSION: Interval resolution of previously noted hydronephrosis. Multiple simple cortical cysts bilaterally, benign. Otherwise unremarkable renal sonogram. Electronically Signed   By: Fidela Salisbury MD   On: 12/25/2020 22:00   DG Chest Port 1 View  Result Date: 12/24/2020 CLINICAL DATA:  Shortness of  breath and chest pain. EXAM: PORTABLE CHEST 1 VIEW COMPARISON:  Radiograph earlier today. Lung bases from abdominal CT earlier today. FINDINGS: Lung volumes are low. Similar cardiomegaly increasing bibasilar atelectasis. No pulmonary edema, large pleural effusion, or pneumothorax. Stable osseous structures. IMPRESSION: Low lung volumes with increasing bibasilar atelectasis from earlier today. Electronically Signed   By: Keith Rake M.D.   On: 12/24/2020 20:57   DG C-Arm 1-60 Min-No Report  Result Date: 12/25/2020 Fluoroscopy was utilized by the requesting physician.  No radiographic interpretation.    Assessment: Procedure(s): CYSTOSCOPY WITH RETROGRADE PYELOGRAM/URETERAL STENT PLACEMENT, 1 Day Post-Op and appears to be getting better now that she has source control of her infection.  Her renal function is slightly improved, but still way off from her baseline.  Plan: The patient is getting better and recovering from urosepsis after an infected ureteral stone in the right collecting system.  She will need 2 weeks of culture specific antibiotics and follow-up with Dr. Abner Greenspan for further treatment.  Okay for catheter to be removed today.  Will continue to follow from [place].  The patient will be contacted with a follow-up appointment in the next several days.   Louis Meckel, MD Urology 12/26/2020, 2:11 PM

## 2020-12-27 DIAGNOSIS — R6521 Severe sepsis with septic shock: Secondary | ICD-10-CM | POA: Diagnosis not present

## 2020-12-27 DIAGNOSIS — A419 Sepsis, unspecified organism: Secondary | ICD-10-CM | POA: Diagnosis not present

## 2020-12-27 LAB — BASIC METABOLIC PANEL
Anion gap: 11 (ref 5–15)
BUN: 66 mg/dL — ABNORMAL HIGH (ref 8–23)
CO2: 24 mmol/L (ref 22–32)
Calcium: 9 mg/dL (ref 8.9–10.3)
Chloride: 101 mmol/L (ref 98–111)
Creatinine, Ser: 2.53 mg/dL — ABNORMAL HIGH (ref 0.44–1.00)
GFR, Estimated: 19 mL/min — ABNORMAL LOW (ref >60)
Glucose, Bld: 107 mg/dL — ABNORMAL HIGH (ref 70–99)
Potassium: 3.2 mmol/L — ABNORMAL LOW (ref 3.5–5.1)
Sodium: 136 mmol/L (ref 135–145)

## 2020-12-27 LAB — GLUCOSE, CAPILLARY
Glucose-Capillary: 100 mg/dL — ABNORMAL HIGH (ref 70–99)
Glucose-Capillary: 133 mg/dL — ABNORMAL HIGH (ref 70–99)
Glucose-Capillary: 83 mg/dL (ref 70–99)
Glucose-Capillary: 85 mg/dL (ref 70–99)
Glucose-Capillary: 87 mg/dL (ref 70–99)
Glucose-Capillary: 92 mg/dL (ref 70–99)
Glucose-Capillary: 98 mg/dL (ref 70–99)

## 2020-12-27 LAB — URINE CULTURE: Culture: 50000 — AB

## 2020-12-27 LAB — CULTURE, BLOOD (SINGLE)

## 2020-12-27 LAB — CBC
HCT: 30.9 % — ABNORMAL LOW (ref 36.0–46.0)
Hemoglobin: 10.5 g/dL — ABNORMAL LOW (ref 12.0–15.0)
MCH: 30.9 pg (ref 26.0–34.0)
MCHC: 34 g/dL (ref 30.0–36.0)
MCV: 90.9 fL (ref 80.0–100.0)
Platelets: 51 10*3/uL — ABNORMAL LOW (ref 150–400)
RBC: 3.4 MIL/uL — ABNORMAL LOW (ref 3.87–5.11)
RDW: 13.9 % (ref 11.5–15.5)
WBC: 11.6 10*3/uL — ABNORMAL HIGH (ref 4.0–10.5)
nRBC: 0 % (ref 0.0–0.2)

## 2020-12-27 MED ORDER — APIXABAN 2.5 MG PO TABS
2.5000 mg | ORAL_TABLET | Freq: Two times a day (BID) | ORAL | Status: DC
  Administered 2020-12-27 – 2020-12-30 (×7): 2.5 mg via ORAL
  Filled 2020-12-27 (×7): qty 1

## 2020-12-27 MED ORDER — DILTIAZEM HCL 60 MG PO TABS
120.0000 mg | ORAL_TABLET | Freq: Two times a day (BID) | ORAL | Status: DC
  Administered 2020-12-27 – 2020-12-30 (×7): 120 mg via ORAL
  Filled 2020-12-27 (×7): qty 2

## 2020-12-27 MED ORDER — CLONIDINE HCL 0.1 MG PO TABS: 0.1000 mg | ORAL_TABLET | Freq: Two times a day (BID) | ORAL | Status: DC

## 2020-12-27 MED ORDER — POTASSIUM CHLORIDE 10 MEQ/100ML IV SOLN
10.0000 meq | INTRAVENOUS | Status: AC
  Administered 2020-12-27 (×2): 10 meq via INTRAVENOUS
  Filled 2020-12-27 (×2): qty 100

## 2020-12-27 MED ORDER — ATORVASTATIN CALCIUM 20 MG PO TABS
20.0000 mg | ORAL_TABLET | Freq: Every day | ORAL | Status: DC
  Administered 2020-12-27 – 2020-12-30 (×4): 20 mg via ORAL
  Filled 2020-12-27: qty 2
  Filled 2020-12-27 (×2): qty 1
  Filled 2020-12-27: qty 2

## 2020-12-27 NOTE — Progress Notes (Signed)
The order for simvastatin(Zocor) was changed to an equivalent dose of atorvastatin(Lipitor) due to the potential drug interaction with diltiazem.  When taken in combination with medications that inhibit its metabolism, simvastatin can accumulate which increases the risk of liver toxicity, myopathy, or rhabdomyolysis.  Simvastatin dose should not exceed 10mg /day in patients taking verapamil, diltiazem, fibrates, or niacin >or= 1g/day.   Simvastatin dose should not exceed 20mg /day in patients taking amlodipine, ranolazine or amiodarone.   Please consider this potential interaction at discharge.  Ulice Dash D 12/27/2020 11:30 AM

## 2020-12-27 NOTE — Evaluation (Signed)
Occupational Therapy Evaluation Patient Details Name: [name] [name] MRN: [id number] DOB: 09-27-1940 Today's Date: 12/27/2020    History of Present Illness 80 yo female admitted with septic shock. S/P ureteral stent placement 6/3. Hx of OA, R THA-DA 2015, L THA 2012, obesity, DM, DVT, PE, gout, Afib.   Clinical Impression   [name] [name] is a 80 year old woman typically independent with ADLs and mobility with rolator/RW. On evaluation she presents with generalized weakness, decreased activity tolerance, impaired balance and generalized complaints of pain. She required min assist for transfer to side of bed, mod assist with +2 for safety to stand and min assist x 2 for transfer to recliner. Unable to maintain standing position initially due to complaints of feeling like she was going to pass out. Vital signs stable however. Patient initially found on 2 L [place] and o2 sats maintained above 92% on RA. Patient needing set up assistance and seated position for UB ADLs and mod-max assist for LB ADLs and toileting.  Patient will benefit from skilled OT services while in hospital to improve deficits and learn compensatory strategies as needed in order to return to PLOF. Recommend short term rehab at this time.       Follow Up Recommendations  SNF    Equipment Recommendations  Other (comment) (wide BSC)    Recommendations for Other Services       Precautions / Restrictions Precautions Precautions: Fall Restrictions Weight Bearing Restrictions: No      Mobility Bed Mobility Overal bed mobility: Needs Assistance Bed Mobility: Supine to Sit     Supine to sit: Min assist;+2 for safety/equipment;HOB elevated     General bed mobility comments: increased time and use of bed rails to transfer to side of bed - min assist for RLE.    Transfers Overall transfer level: Needs assistance Equipment used: None;Rolling walker (2 wheeled) Transfers: Sit to/from [facility] Sit to  Stand: Mod assist;+2 safety/equipment Stand pivot transfers: Min assist;+2 physical assistance;+2 safety/equipment       General transfer comment: Total of mod assist with 2 assistants to stand with RW. Patient unable to maintain standing position stating "I feel like I"m going to pass out." BP WFL. Pivoted to recliner without walker wiht +2 assistance - slowly.    Balance Overall balance assessment: Needs assistance Sitting-balance support: No upper extremity supported Sitting balance-Leahy Scale: Fair     Standing balance support: Bilateral upper extremity supported Standing balance-Leahy Scale: Poor Standing balance comment: requires external assistance                           ADL either performed or assessed with clinical judgement   ADL Overall ADL's : Needs assistance/impaired Eating/Feeding: Set up;Sitting   Grooming: Set up;Sitting;Wash/dry face   Upper Body Bathing: Set up;Sitting   Lower Body Bathing: Moderate assistance;Sitting/lateral leans   Upper Body Dressing : Set up;Sitting   Lower Body Dressing: Maximal assistance;Sit to/from stand   Toilet Transfer: +2 for safety/equipment;Stand-pivot;BSC;Moderate assistance   Toileting- Clothing Manipulation and Hygiene: Maximal assistance;Sit to/from stand       Functional mobility during ADLs: Moderate assistance;+2 for safety/equipment       Vision Patient Visual Report: No change from baseline       Perception     Praxis      Pertinent Vitals/Pain Pain Assessment: Faces Faces Pain Scale: Hurts a little bit Pain Location: generalized Pain Descriptors / Indicators: Grimacing;Discomfort Pain Intervention(s): Limited  activity within patient's tolerance;Monitored during session     Hand Dominance Right   Extremity/Trunk Assessment Upper Extremity Assessment Upper Extremity Assessment: Overall WFL for tasks assessed   Lower Extremity Assessment Lower Extremity Assessment: Defer to PT  evaluation   Cervical / Trunk Assessment Cervical / Trunk Assessment: Normal   Communication Communication Communication: No difficulties   Cognition Arousal/Alertness: Awake/alert Behavior During Therapy: WFL for tasks assessed/performed Overall Cognitive Status: Within Functional Limits for tasks assessed                                     General Comments       Exercises     Shoulder Instructions      Home Living Family/patient expects to be discharged to:: Private residence Living Arrangements: Children Available Help at Discharge: Family Type of Home: House Home Access: Ramped entrance     Home Layout: One level     Bathroom Shower/Tub: Walk-in shower         Home Equipment: Environmental consultant - 2 wheels;Wheelchair - manual;Cane - single point;Bedside commode;Shower seat;Walker - 4 wheels          Prior Functioning/Environment Level of Independence: Needs assistance  Gait / Transfers Assistance Needed: uses RW for ambulation ADL's / Homemaking Assistance Needed: lives with son who assists as needed.            OT Problem List: Decreased strength;Decreased activity tolerance;Impaired balance (sitting and/or standing);Decreased knowledge of use of DME or AE;Obesity;Pain      OT Treatment/Interventions: Self-care/ADL training;Therapeutic exercise;DME and/or AE instruction;Therapeutic activities;Balance training;Patient/family education    OT Goals(Current goals can be found in the care plan section) Acute Rehab OT Goals Patient Stated Goal: to get stronger to go home OT Goal Formulation: With patient Time For Goal Achievement: 01/10/21 Potential to Achieve Goals: Good  OT Frequency: Min 2X/week   Barriers to D/C:            Co-evaluation              AM-PAC OT "6 Clicks" Daily Activity     Outcome Measure Help from another person eating meals?: A Little Help from another person taking care of personal grooming?: A Little Help from  another person toileting, which includes using toliet, bedpan, or urinal?: A Lot Help from another person bathing (including washing, rinsing, drying)?: A Lot Help from another person to put on and taking off regular upper body clothing?: A Little Help from another person to put on and taking off regular lower body clothing?: A Lot 6 Click Score: 15   End of Session Equipment Utilized During Treatment: Rolling walker Nurse Communication: Mobility status  Activity Tolerance: Patient limited by fatigue Patient left: in chair;with call bell/phone within reach  OT Visit Diagnosis: Muscle weakness (generalized) (M62.81);Other abnormalities of gait and mobility (R26.89);Pain                Time: 9242-6834 OT Time Calculation (min): 24 min Charges:  OT General Charges $OT Visit: 1 Visit OT Evaluation $OT Eval Moderate Complexity: 1 Mod  Bettyjo Lundblad, OTR/L [place]  Office [phone] Pager: [place] 12/27/2020, 10:52 AM

## 2020-12-27 NOTE — Progress Notes (Signed)
eLink Physician-Brief Progress Note Patient Name: [name] [name] DOB: Jul 07, 1941 MRN: [id number]   Date of Service  12/27/2020  HPI/Events of Note  Hypokalemia - K+ = 3.2 and Creatinine = 2.53.   eICU Interventions  Will cautiously replace K+.      Intervention Category Major Interventions: Electrolyte abnormality - evaluation and management  Betha Shadix [place] 12/27/2020, 6:50 AM

## 2020-12-27 NOTE — Progress Notes (Addendum)
NAME:  [name] [name], MRN:  [id number], DOB:  Jun 02, 1941, LOS: 2 ADMISSION DATE:  12/24/2020, CONSULTATION DATE:   12/25/20 REFERRING MD:  Freddi Starr, MD, CHIEF COMPLAINT:  Flank pain  Brief Narrative:  [name] [name] is a 80 y.o. woman who presented to [facility] with right sided flank pain and decreased oral intake secondary to urosepsis with ureteral stone with hydronephrosis on CT A/P.   Pertinent  Medical History  HTN Gout PE unprovoked on AC DM2 not on insulin  Significant Hospital Events: Including procedures, antibiotic start and stop dates in addition to other pertinent events   . 6/2 admitted with right sided flank pain and decreased oral intake secondary to urosepsis with ureteral stone with hydronephrosis on CT A/P. Developed A-fib RVR at [facility] ED became hypotensive with Cardizem improved with IV Amio .  6/3 s/p R ureteral stent  . D/c'd amiodarone am 6/5    Scheduled Meds: . amiodarone  250 mg Intravenous Once  . chlorhexidine  15 mL Mouth Rinse BID  . Chlorhexidine Gluconate Cloth  6 each Topical Daily  . hydrALAZINE  5 mg Intravenous Once  . insulin aspart  0-15 Units Subcutaneous Q4H  . mouth rinse  15 mL Mouth Rinse q12n4p  . pantoprazole (PROTONIX) IV  40 mg Intravenous Q24H  . simvastatin  40 mg Oral QPM   Continuous Infusions: . sodium chloride Stopped (12/25/20 1542)  . amiodarone 30 mg/hr (12/26/20 2014)  . cefTRIAXone (ROCEPHIN)  IV Stopped (12/26/20 2115)  . lactated ringers 100 mL/hr at 12/26/20 2100   PRN Meds:.acetaminophen, docusate sodium, HYDROmorphone (DILAUDID) injection, polyethylene glycol    Interim History / Subjective:  C/o midline positional back pain, ok appetite no nausea or sob or abd pain  Objective   Blood pressure 123/72, pulse 97, temperature 98.2 F (36.8 C), temperature source Oral, resp. rate 19, height 5\' 3"  (1.6 m), weight 99.6 kg, SpO2 93 %.        Intake/Output Summary (Last 24 hours) at 12/27/2020 0601 Last  data filed at 12/26/2020 1800 Gross per 24 hour  Intake 1299.83 ml  Output 675 ml  Net 624.83 ml   Filed Weights   12/24/20 1636 12/25/20 0433 12/26/20 0346  Weight: 94.8 kg 99.6 kg 99.6 kg    Examination: Tmax   99 General appearance:   Elderly wf sitting up eating bfast At Rest 02 sats  96% on RA  No jvd Oropharynx clear,  mucosa nl Neck supple Lungs with a few scattered exp > insp rhonchi bilaterally IRIR  afib on monitor pulse around 100  Abd obese with nl excursion  Extr warm with no edema or clubbing noted Neuro  Sensorium intact,  no apparent motor deficits         Assessment & Plan:  Septic Shock secondary to UTI -Patient presented with tachypnea, hypotensive, tachycardia, leukocytosis, and lactic acidosis  with acute concern for infection meeting criteria for septic shock >> resolved am 6/4   Obstructed, infected, ureteral stone - 6/3 early am  cystoscopy, ureteral stent placement with urology - cultures from bc growing enterobacter and e coli from 6/2  But 6/3 ngtd  P:  >>>  Continue Ceftriaxone - check sensitivities  w/a    Multifactorial AKI from infection, obstruction and hypovolemia Lab Results  Component Value Date   CREATININE 2.53 (H) 12/27/2020   CREATININE 3.58 (H) 12/26/2020   CREATININE 3.91 (H) 12/26/2020   Non-anion gap metabolic acidosis - suspect from azotemia, maybe  some non gap RTA P: Follow renal function  Monitor urine output Trend Bmet Avoid nephrotoxins Ensure adequate renal perfusion  IV hydration   D/c'd  Bicarb drip 6/4  Decreased IV fluid to kvo 6/5 as became hypertentsive on 100 cc/ h LR    Atrial Fibrillation with RVR Elevated Troponin - downtrending  - likely driven by sepsis, demand ischemia - followed by [facility] as outpt P: Continuous telemetry  Try off amiodarone p start cardizem  Added cardizem po 6/5  ? Needs cards eval     Acute hypoxemic respiratory failure -- min 02 requirements am 6/5 P:  >>>  Wean  off for sats > 92%    Unprovoked VTE on Eliquis in 2020 P: Continue home Eliquis   HTN -Home medications include Norvasc, Lasix,  P: Add cardizem po for afib also    DM2 P: Monitor CBG currently well controlled with no need of SSI   Gout P: Home Allopurinol on hold  Hypothyroidism  P: Continue home Euthyrox    Best practice   Diet:  Advance as tol  Pain/Anxiety/Delirium protocol (if indicated): none VAP protocol (if indicated): Not indicated DVT prophylaxis: Systemic AC GI prophylaxis: H2B Glucose control:  SSI No Central venous access:  N/A Arterial line:  N/A Foley:  Yes, and it is still needed Mobility:  bed rest  PT consulted: N/A Last date of multidisciplinary goals of care discussion Update family daily  Code Status:  full code Disposition: ICU   [name] Gully, MD Pulmonary and [place] [phone]   After 7:00 pm call Elink  [phone]

## 2020-12-28 ENCOUNTER — Encounter (HOSPITAL_COMMUNITY): Payer: Self-pay | Admitting: Urology

## 2020-12-28 DIAGNOSIS — R7881 Bacteremia: Secondary | ICD-10-CM

## 2020-12-28 DIAGNOSIS — N390 Urinary tract infection, site not specified: Secondary | ICD-10-CM

## 2020-12-28 DIAGNOSIS — B9620 Unspecified Escherichia coli [E. coli] as the cause of diseases classified elsewhere: Secondary | ICD-10-CM

## 2020-12-28 LAB — GLUCOSE, CAPILLARY
Glucose-Capillary: 111 mg/dL — ABNORMAL HIGH (ref 70–99)
Glucose-Capillary: 110 mg/dL — ABNORMAL HIGH (ref 70–99)
Glucose-Capillary: 106 mg/dL — ABNORMAL HIGH (ref 70–99)
Glucose-Capillary: 126 mg/dL — ABNORMAL HIGH (ref 70–99)
Glucose-Capillary: 114 mg/dL — ABNORMAL HIGH (ref 70–99)

## 2020-12-28 LAB — CBC
HCT: 33.6 % — ABNORMAL LOW (ref 36.0–46.0)
Hemoglobin: 11.3 g/dL — ABNORMAL LOW (ref 12.0–15.0)
MCH: 31 pg (ref 26.0–34.0)
MCHC: 33.6 g/dL (ref 30.0–36.0)
MCV: 92.3 fL (ref 80.0–100.0)
Platelets: 66 10*3/uL — ABNORMAL LOW (ref 150–400)
RBC: 3.64 MIL/uL — ABNORMAL LOW (ref 3.87–5.11)
RDW: 14.1 % (ref 11.5–15.5)
WBC: 12.9 10*3/uL — ABNORMAL HIGH (ref 4.0–10.5)
nRBC: 0 % (ref 0.0–0.2)

## 2020-12-28 LAB — BASIC METABOLIC PANEL
Anion gap: 8 (ref 5–15)
BUN: 50 mg/dL — ABNORMAL HIGH (ref 8–23)
CO2: 26 mmol/L (ref 22–32)
Calcium: 9.5 mg/dL (ref 8.9–10.3)
Chloride: 105 mmol/L (ref 98–111)
Creatinine, Ser: 1.84 mg/dL — ABNORMAL HIGH (ref 0.44–1.00)
GFR, Estimated: 28 mL/min — ABNORMAL LOW (ref >60)
Glucose, Bld: 121 mg/dL — ABNORMAL HIGH (ref 70–99)
Potassium: 3.4 mmol/L — ABNORMAL LOW (ref 3.5–5.1)
Sodium: 139 mmol/L (ref 135–145)

## 2020-12-28 MED ORDER — CEPHALEXIN 500 MG PO CAPS
500.0000 mg | ORAL_CAPSULE | Freq: Three times a day (TID) | ORAL | Status: DC
  Administered 2020-12-29 – 2020-12-30 (×3): 500 mg via ORAL
  Filled 2020-12-28 (×3): qty 1

## 2020-12-28 MED ORDER — FERROUS SULFATE 325 (65 FE) MG PO TABS
325.0000 mg | ORAL_TABLET | Freq: Two times a day (BID) | ORAL | Status: DC
  Administered 2020-12-28 – 2020-12-30 (×4): 325 mg via ORAL
  Filled 2020-12-28 (×5): qty 1

## 2020-12-28 MED ORDER — LIP MEDEX EX OINT
TOPICAL_OINTMENT | CUTANEOUS | Status: DC | PRN
  Filled 2020-12-28: qty 7

## 2020-12-28 MED ORDER — LEVOTHYROXINE SODIUM 50 MCG PO TABS
50.0000 ug | ORAL_TABLET | Freq: Every day | ORAL | Status: DC
  Administered 2020-12-29 – 2020-12-30 (×2): 50 ug via ORAL
  Filled 2020-12-28 (×2): qty 1

## 2020-12-28 MED ORDER — LORATADINE 10 MG PO TABS
10.0000 mg | ORAL_TABLET | Freq: Every day | ORAL | Status: DC
  Administered 2020-12-29 – 2020-12-30 (×2): 10 mg via ORAL
  Filled 2020-12-28 (×3): qty 1

## 2020-12-28 MED ORDER — FUROSEMIDE 10 MG/ML IJ SOLN
40.0000 mg | Freq: Once | INTRAMUSCULAR | Status: AC
  Administered 2020-12-28: 40 mg via INTRAVENOUS
  Filled 2020-12-28: qty 4

## 2020-12-28 MED ORDER — POTASSIUM CHLORIDE CRYS ER 20 MEQ PO TBCR
40.0000 meq | EXTENDED_RELEASE_TABLET | Freq: Two times a day (BID) | ORAL | Status: AC
  Administered 2020-12-28 (×2): 40 meq via ORAL
  Filled 2020-12-28 (×2): qty 2

## 2020-12-28 MED ORDER — INSULIN ASPART 100 UNIT/ML IJ SOLN: 0.0000 [IU] | Freq: Every day | INTRAMUSCULAR | Status: DC

## 2020-12-28 MED ORDER — OXYBUTYNIN CHLORIDE ER 5 MG PO TB24
10.0000 mg | ORAL_TABLET | Freq: Every day | ORAL | Status: DC
  Administered 2020-12-28 – 2020-12-29 (×2): 10 mg via ORAL
  Filled 2020-12-28 (×2): qty 2

## 2020-12-28 MED ORDER — INSULIN ASPART 100 UNIT/ML IJ SOLN
0.0000 [IU] | Freq: Three times a day (TID) | INTRAMUSCULAR | Status: DC
  Administered 2020-12-29: 2 [IU] via SUBCUTANEOUS

## 2020-12-28 NOTE — Consult Note (Signed)
[facility] for Infectious Disease  Total days of antibiotics 5/ceftriaxone       Reason for Consult: e.coli uti and bacteremia   Referring Physician: sheikh  Active Problems:   Septic shock ([place])    HPI: [name] [name] is a 80 y.o. female admitted on 6/2 with a few days of right sided flank pain which continued to worsen along with left sided chest pain. She was found to have afbi with RVR, hypotension, leukocytosis of 15K, and LA of 3.4. she was started on empiric abtx, IVF,low dose vasopressors, amiodarone for AFIB. Her infectious work up revealed a 41mm x 92mm right proximal ureteral stone with right sided hydronephrosis with ecoli (pan sensitive) bacteremia/UTI. On 6/3 she underwent cystoscopy with right ureteral stent placement. The stent to be kept in place for the next few weeks until lithotrypsy can be performed. She is on day 5 of ceftriaxone, with repeat blood cx NGTD. WBC slightly improved to 12.9. thrombocytopenia improving. AKI much improved at cr of 1/84 down from 3.83.still having afib with resting hr of 80s.   Past Medical History:  Diagnosis Date  . Anemia   . Arthritis    Ostearthritis- hips, knees, fingers  . Diabetes mellitus without complication ([place])   . DVT (deep venous thrombosis) ([place])   . Dyspnea   . GERD (gastroesophageal reflux disease)   . Headache(784.0)    tx. Valproic acid  . Hypertension   . Hypothyroidism   . Pulmonary embolism ([facility])     Allergies: No Known Allergies   MEDICATIONS: . apixaban  2.5 mg Oral BID  . atorvastatin  20 mg Oral Daily  . [START ON 12/29/2020] cephALEXin  500 mg Oral Q8H  . chlorhexidine  15 mL Mouth Rinse BID  . Chlorhexidine Gluconate Cloth  6 each Topical Daily  . diltiazem  120 mg Oral Q12H  . ferrous sulfate  325 mg Oral BID  . insulin aspart  0-15 Units Subcutaneous TID WC  . insulin aspart  0-5 Units Subcutaneous QHS  . [START ON 12/29/2020] levothyroxine  50 mcg Oral QAC breakfast  . loratadine  10 mg  Oral Daily  . mouth rinse  15 mL Mouth Rinse q12n4p  . oxybutynin  10 mg Oral QHS  . potassium chloride  40 mEq Oral BID    Social History   Tobacco Use  . Smoking status: Never Smoker  . Smokeless tobacco: Never Used  Vaping Use  . Vaping Use: Never used  Substance Use Topics  . Alcohol use: No    Alcohol/week: 0.0 standard drinks  . Drug use: No    Family History  Problem Relation Age of Onset  . Colitis Sister 45       alive  . Cancer Sister 57       colon  . Cancer Mother 35       uterine  . Heart disease Father 3       heart failure  . Heart attack Brother 40  . Healthy Daughter   . Healthy Son   . Pulmonary embolism Brother 38  . Heart disease Brother   . Healthy Son   . Healthy Son    Review of Systems  Constitutional: Negative for fever, chills, diaphoresis, activity change, appetite change, fatigue and unexpected weight change.  HENT: Negative for congestion, sore throat, rhinorrhea, sneezing, trouble swallowing and sinus pressure.  Eyes: Negative for photophobia and visual disturbance.  Respiratory: Negative for cough, chest tightness, shortness of breath,  wheezing and stridor.  Cardiovascular: Negative for chest pain, palpitations and leg swelling.  Gastrointestinal: Negative for nausea, vomiting, abdominal pain, diarrhea, constipation, blood in stool, abdominal distention and anal bleeding.  Genitourinary: +right flank pain. Negative for dysuria, hematuria,  and difficulty urinating.  Musculoskeletal: Negative for myalgias, back pain, joint swelling, arthralgias and gait problem.  Skin: Negative for color change, pallor, rash and wound.  Neurological: Negative for dizziness, tremors, weakness and light-headedness.  Hematological: Negative for adenopathy. Does not bruise/bleed easily.  Psychiatric/Behavioral: Negative for behavioral problems, confusion, sleep disturbance, dysphoric mood, decreased concentration and agitation.     OBJECTIVE: Temp:   [97.6 F (36.4 C)-98.2 F (36.8 C)] 98 F (36.7 C) (06/06 1608) Pulse Rate:  [63-111] 76 (06/06 1400) Resp:  [18-35] 25 (06/06 1400) BP: (126-162)/(64-89) 139/67 (06/06 1400) SpO2:  [90 %-98 %] 97 % (06/06 1400) Physical Exam  Constitutional:  oriented to person, place, and time. appears well-developed and well-nourished. No distress.  HENT: [place]/AT, PERRLA, no scleral icterus Mouth/Throat: Oropharynx is clear and moist. No oropharyngeal exudate.  Cardiovascular: Normal rate, regular rhythm and normal heart sounds. Exam reveals no gallop and no friction rub.  No murmur heard.  Pulmonary/Chest: Effort normal and breath sounds normal. No respiratory distress.  has no wheezes.  Neck = supple, no nuchal rigidity Abdominal: Soft. Bowel sounds are normal.  exhibits no distension. There is no tenderness.  Lymphadenopathy: no cervical adenopathy. No axillary adenopathy Neurological: alert and oriented to person, place, and time.  Skin: Skin is warm and dry. No rash noted. No erythema.  Psychiatric: a normal mood and affect.  behavior is normal.    LABS: Results for orders placed or performed during the hospital encounter of 12/24/20 (from the past 48 hour(s))  Glucose, capillary     Status: Abnormal   Collection Time: 12/26/20  8:24 PM  Result Value Ref Range   Glucose-Capillary 101 (H) 70 - 99 mg/dL    Comment: Glucose reference range applies only to samples taken after fasting for at least 8 hours.  Glucose, capillary     Status: None   Collection Time: 12/27/20 12:31 AM  Result Value Ref Range   Glucose-Capillary 85 70 - 99 mg/dL    Comment: Glucose reference range applies only to samples taken after fasting for at least 8 hours.  CBC     Status: Abnormal   Collection Time: 12/27/20  2:09 AM  Result Value Ref Range   WBC 11.6 (H) 4.0 - 10.5 K/uL   RBC 3.40 (L) 3.87 - 5.11 MIL/uL   Hemoglobin 10.5 (L) 12.0 - 15.0 g/dL   HCT 30.9 (L) 36.0 - 46.0 %   MCV 90.9 80.0 - 100.0 fL   MCH  30.9 26.0 - 34.0 pg   MCHC 34.0 30.0 - 36.0 g/dL   RDW 13.9 11.5 - 15.5 %   Platelets 51 (L) 150 - 400 K/uL    Comment: Immature Platelet Fraction may be clinically indicated, consider ordering this additional test [id number]    nRBC 0.0 0.0 - 0.2 %    Comment: Performed at [facility], [place] [address] [place], [place] [address]  Basic metabolic panel     Status: Abnormal   Collection Time: 12/27/20  2:09 AM  Result Value Ref Range   Sodium 136 135 - 145 mmol/L   Potassium 3.2 (L) 3.5 - 5.1 mmol/L   Chloride 101 98 - 111 mmol/L   CO2 24 22 - 32 mmol/L   Glucose, Bld  107 (H) 70 - 99 mg/dL    Comment: Glucose reference range applies only to samples taken after fasting for at least 8 hours.   BUN 66 (H) 8 - 23 mg/dL   Creatinine, Ser 2.53 (H) 0.44 - 1.00 mg/dL    Comment: DELTA CHECK NOTED   Calcium 9.0 8.9 - 10.3 mg/dL   GFR, Estimated 19 (L) >60 mL/min    Comment: (NOTE) Calculated using the CKD-EPI Creatinine Equation (2021)    Anion gap 11 5 - 15    Comment: Performed at [facility], [place] [address] [place], [place] [address]  Glucose, capillary     Status: None   Collection Time: 12/27/20  4:09 AM  Result Value Ref Range   Glucose-Capillary 92 70 - 99 mg/dL    Comment: Glucose reference range applies only to samples taken after fasting for at least 8 hours.  Glucose, capillary     Status: None   Collection Time: 12/27/20  8:05 AM  Result Value Ref Range   Glucose-Capillary 83 70 - 99 mg/dL    Comment: Glucose reference range applies only to samples taken after fasting for at least 8 hours.   Comment 1 Notify RN    Comment 2 Document in Chart   Glucose, capillary     Status: Abnormal   Collection Time: 12/27/20 12:08 PM  Result Value Ref Range   Glucose-Capillary 100 (H) 70 - 99 mg/dL    Comment: Glucose reference range applies only to samples taken after fasting for at least 8 hours.  Glucose, capillary     Status: None    Collection Time: 12/27/20  3:48 PM  Result Value Ref Range   Glucose-Capillary 98 70 - 99 mg/dL    Comment: Glucose reference range applies only to samples taken after fasting for at least 8 hours.   Comment 1 Notify RN    Comment 2 Document in Chart   Glucose, capillary     Status: Abnormal   Collection Time: 12/27/20  8:06 PM  Result Value Ref Range   Glucose-Capillary 133 (H) 70 - 99 mg/dL    Comment: Glucose reference range applies only to samples taken after fasting for at least 8 hours.   Comment 1 Notify RN    Comment 2 Document in Chart   Glucose, capillary     Status: None   Collection Time: 12/27/20 11:45 PM  Result Value Ref Range   Glucose-Capillary 87 70 - 99 mg/dL    Comment: Glucose reference range applies only to samples taken after fasting for at least 8 hours.   Comment 1 Notify RN    Comment 2 Document in Chart   CBC     Status: Abnormal   Collection Time: 12/28/20  2:55 AM  Result Value Ref Range   WBC 12.9 (H) 4.0 - 10.5 K/uL   RBC 3.64 (L) 3.87 - 5.11 MIL/uL   Hemoglobin 11.3 (L) 12.0 - 15.0 g/dL   HCT 33.6 (L) 36.0 - 46.0 %   MCV 92.3 80.0 - 100.0 fL   MCH 31.0 26.0 - 34.0 pg   MCHC 33.6 30.0 - 36.0 g/dL   RDW 14.1 11.5 - 15.5 %   Platelets 66 (L) 150 - 400 K/uL    Comment: Immature Platelet Fraction may be clinically indicated, consider ordering this additional test [id number]    nRBC 0.0 0.0 - 0.2 %    Comment: Performed at [facility], [address] [place], [place]  [address]  Basic metabolic panel     Status: Abnormal   Collection Time: 12/28/20  2:55 AM  Result Value Ref Range   Sodium 139 135 - 145 mmol/L   Potassium 3.4 (L) 3.5 - 5.1 mmol/L   Chloride 105 98 - 111 mmol/L   CO2 26 22 - 32 mmol/L   Glucose, Bld 121 (H) 70 - 99 mg/dL    Comment: Glucose reference range applies only to samples taken after fasting for at least 8 hours.   BUN 50 (H) 8 - 23 mg/dL   Creatinine, Ser 1.84 (H) 0.44 - 1.00 mg/dL   Calcium 9.5 8.9  - 10.3 mg/dL   GFR, Estimated 28 (L) >60 mL/min    Comment: (NOTE) Calculated using the CKD-EPI Creatinine Equation (2021)    Anion gap 8 5 - 15    Comment: Performed at [facility], [place] [address], [place], [place] [address]  Glucose, capillary     Status: Abnormal   Collection Time: 12/28/20  3:41 AM  Result Value Ref Range   Glucose-Capillary 111 (H) 70 - 99 mg/dL    Comment: Glucose reference range applies only to samples taken after fasting for at least 8 hours.   Comment 1 Notify RN    Comment 2 Document in Chart   Glucose, capillary     Status: Abnormal   Collection Time: 12/28/20  7:45 AM  Result Value Ref Range   Glucose-Capillary 110 (H) 70 - 99 mg/dL    Comment: Glucose reference range applies only to samples taken after fasting for at least 8 hours.  Glucose, capillary     Status: Abnormal   Collection Time: 12/28/20 11:49 AM  Result Value Ref Range   Glucose-Capillary 106 (H) 70 - 99 mg/dL    Comment: Glucose reference range applies only to samples taken after fasting for at least 8 hours.  Glucose, capillary     Status: Abnormal   Collection Time: 12/28/20  3:36 PM  Result Value Ref Range   Glucose-Capillary 126 (H) 70 - 99 mg/dL    Comment: Glucose reference range applies only to samples taken after fasting for at least 8 hours.    MICRO: Reviewed 6/2 ecoli bacteremia and uti (pan cx) IMAGING: No results found.   Assessment/Plan:  80yo F with severe sepsis from ecoli bacteremia/uti from nephrolithiasis s/p right ureteral stent  - will plan for total of 7 days of treatment since 6/3 since continues to have improvement - we will also give 2 additional days of treatment (to start on the day of having lithotripsy - since it is thought that the stone likely has ecoli)  Will follow up in clinic in 7-10days from discharge.

## 2020-12-28 NOTE — Progress Notes (Signed)
3 Days Post-Op Subjective: Pain controlled. No nausea or emesis. Afebrile. Looks much better.  Objective: Vital signs in last 24 hours: Temp:  [97.6 F (36.4 C)-98.2 F (36.8 C)] 98.2 F (36.8 C) (06/06 1241) Pulse Rate:  [63-111] 76 (06/06 1400) Resp:  [18-35] 25 (06/06 1400) BP: (126-162)/(64-89) 139/67 (06/06 1400) SpO2:  [90 %-98 %] 97 % (06/06 1400)  Intake/Output from previous day: 06/05 0701 - 06/06 0700 In: 612.1 [I.V.:414.1; IV Piggyback:198.1] Out: 1000 [Urine:1000] Intake/Output this shift: No intake/output data recorded.  Physical Exam:  General: Alert and oriented CV: RRR Lungs: Clear Abdomen: Soft, ND, NT Ext: NT, No erythema  Lab Results: Recent Labs    12/26/20 0500 12/27/20 0209 12/28/20 0255  HGB 10.3* 10.5* 11.3*  HCT 30.3* 30.9* 33.6*   BMET Recent Labs    12/27/20 0209 12/28/20 0255  NA 136 139  K 3.2* 3.4*  CL 101 105  CO2 24 26  GLUCOSE 107* 121*  BUN 66* 50*  CREATININE 2.53* 1.84*  CALCIUM 9.0 9.5     Studies/Results: No results found.  Assessment/Plan: 1. Right proximal ureteral stone causing right hydroureteronephrosis in setting of sepsis and AKI:CT A/P 12/24/2020 with 10 x 7 mm right proximal ureteral stone with mild right hydronephrosis. Also seen with approximately 3 mm right lower pole renal stone and punctate left renal stones. S/p right ureteral stent placement on 12/25/2020.  2. Septic shock due to above 3. FWY:OVZCHYIFOY 4.2 at initial consultation from baseline of 1 4. UTI: Urinalysis with likely infection. Urine culture pending. 5. Atrial fibrillation 6. Hypotension 7. Elevated troponin  -UCx with 50K e coli. Needs to be on 2 weeks of PO abx as outpatient -I messaged schedulers to arrange for followup -Following peripherally   LOS: 3 days   Matt R. Azelie Noguera MD 12/28/2020, 4:06 PM [facility]  Pager: [phone]

## 2020-12-28 NOTE — TOC Progression Note (Signed)
Transition of Care [facility]) - Progression Note    Patient Details  Name: Davonne Baby MRN: [id number] Date of Birth: [birth year]  [facility] of [facility] CM/SW Contact  Leeroy Cha, RN Phone Number: 12/28/2020, 8:38 AM  Clinical Narrative:    Pertinent  Medical History  HTN Gout PE unprovoked on [facility] DM2 not on insulin  Significant Hospital Events: Including procedures, antibiotic start and stop dates in addition to other pertinent events    6/2 admitted with right sided flank pain and decreased oral intake secondary to urosepsis with ureteral stone with hydronephrosis on CT A/P. Developed A-fib RVR at [facility] became hypotensive with Cardizem improved with IV Amio   6/3 s/p R ureteral stent   D/c'd amiodarone am 6/5   PLAN: to return to home with self care.  Following for toc needs.   Expected Discharge Plan: Home/Self Care Barriers to Discharge: Continued Medical Work up  Expected Discharge Plan and Services Expected Discharge Plan: Home/Self Care   Discharge Planning Services: CM Consult                                           Social Determinants of Health (SDOH) Interventions    Readmission Risk Interventions No flowsheet data found.

## 2020-12-28 NOTE — Progress Notes (Signed)
PCCM transferred for [facility] to take over 6/6. No pressor requirement since 6/3. Improving Cr. Will sign off.

## 2020-12-28 NOTE — Progress Notes (Signed)
Physical Therapy Treatment Patient Details Name: [name] [name] MRN: [id number] DOB: [birth year] Today's Date: 12/28/2020    History of Present Illness 80 yo female admitted with septic shock. S/P ureteral stent placement 6/3. Hx of OA, R THA-DA 2015, L THA 2012, obesity, DM, DVT, PE, gout, Afib.    PT Comments    Progressing slowly with mobility. Remains weak and fatigues easily with activity. Family was present during session. Discussed d/c plan-pt and family prefer home with HHPT. Will continue to follow and progress activity as tolerated.     Follow Up Recommendations  Home health PT;Supervision/Assistance - 24 hour (family politely declines placement;prefers home)     Financial risk analyst;Wheelchair cushion;3in1  (if pt doesn't have DME already)    Recommendations for Other Services       Precautions / Restrictions Precautions Precautions: Fall Restrictions Weight Bearing Restrictions: No    Mobility  Bed Mobility Overal bed mobility: Needs Assistance Bed Mobility: Supine to Sit     Supine to sit: Min assist     General bed mobility comments: Assist to get to EOB. Increased time.    Transfers Overall transfer level: Needs assistance Equipment used: Rolling walker (2 wheeled) Transfers: Sit to/from Stand Sit to Stand: Min assist;+2 physical assistance;+2 safety/equipment;From elevated surface         General transfer comment: Assist to power up, steady, control descent. Increased time. Cues for safety, technique.  Ambulation/Gait Ambulation/Gait assistance: Min assist;+2 physical assistance;+2 safety/equipment Gait Distance (Feet): 5 Feet Assistive device: Rolling walker (2 wheeled) Gait Pattern/deviations: Step-to pattern;Decreased step length - right;Decreased step length - left;Decreased stride length;Trunk flexed     General Gait Details: Pt able to walk a short distance across the room. Fatigues easily. Still weak. Dyspnea 3/4.  HR 135 bpm. Recliner close behind pt and brought up to sit and transport back to bedside.   Stairs             Wheelchair Mobility    Modified Rankin (Stroke Patients Only)       Balance Overall balance assessment: Needs assistance         Standing balance support: Bilateral upper extremity supported Standing balance-Leahy Scale: Poor                              Cognition Arousal/Alertness: Awake/alert Behavior During Therapy: WFL for tasks assessed/performed Overall Cognitive Status: Within Functional Limits for tasks assessed                                        Exercises      General Comments        Pertinent Vitals/Pain Pain Assessment: Faces Faces Pain Scale: Hurts a little bit Pain Location: generalized Pain Descriptors / Indicators: Discomfort;Sore Pain Intervention(s): Limited activity within patient's tolerance;Monitored during session;Repositioned    Home Living                      Prior Function            PT Goals (current goals can now be found in the care plan section) Progress towards PT goals: Progressing toward goals    Frequency    Min 3X/week      PT Plan Current plan remains appropriate    Co-evaluation  AM-PAC PT "6 Clicks" Mobility   Outcome Measure  Help needed turning from your back to your side while in a flat bed without using bedrails?: A Little Help needed moving from lying on your back to sitting on the side of a flat bed without using bedrails?: A Little Help needed moving to and from a bed to a chair (including a wheelchair)?: A Lot Help needed standing up from a chair using your arms (e.g., wheelchair or bedside chair)?: A Lot Help needed to walk in hospital room?: A Lot Help needed climbing 3-5 steps with a railing? : Total 6 Click Score: 13    End of Session   Activity Tolerance: Patient limited by fatigue Patient left: in chair;with call  bell/phone within reach;with family/visitor present   PT Visit Diagnosis: Muscle weakness (generalized) (M62.81)     Time: 2778-2423 PT Time Calculation (min) (ACUTE ONLY): 15 min  Charges:  $Gait Training: 8-22 mins                        Doreatha Massed, PT Acute Rehabilitation  Office: [phone] Pager: [phone]

## 2020-12-28 NOTE — Progress Notes (Signed)
Patient is being transferred to room 1444.  Called to give report to receiving RN.  RN was unavailable and room is still being cleaned.  Receiving RN will call for report when available.

## 2020-12-28 NOTE — Progress Notes (Signed)
PROGRESS NOTE    [name] [name]  ZOX:[id number] DOB: 1940/10/06 DOA: 12/24/2020 PCP: Loman Brooklyn, FNP   Brief Narrative:  Patient is a 80 year old Caucasian obese female with past medical history significant for but not limited to hypertension, gout, history of an unprovoked PE on anticoagulation, diabetes mellitus type 2 not on insulin as well as other comorbidities including proximal afibrillation who presented to [facility], ED with right-sided flank pain and decreased oral intake secondary to urosepsis with ureteral lateral stone and hydronephrosis on CT abdomen pelvis.  She subsequently developed A. fib with RVR became hypotensive and started on Cardizem and then improved with IV amiodarone.  She was found to be in septic shock and subsequently underwent right ureteral stenting and amiodarone was discontinued on 12/27/2020.  On 12/28/2020 she was transferred to [facility] service.  She complains of back pain still but this is improving.  Sepsis physiology is improving.  Anticoagulation has now been resumed and she is now on p.o. Cardizem.  Assessment & Plan:   Active Problems:   Septic shock (HCC)  Septic shock secondary to urinary tract infection in the setting of Right Proximal Ureteral Stone Causing Right Hydrouretonerphrosis, present on admission -She presented with tachypnea, hypotensive, tachycardia, leukocytosis and lactic acidosis with concern for Infection given her renal stone and concern for septic shock -She ended up on pressors for short-term and then subsequently weaned off -Blood cultures x2 showed E. coli present -Underwent ureteral stenting by urology and cultures growing Enterobacter and E. coli from 6 2 and 6 3 but currently no growth to date -Currently on IV ceftriaxone and urine cultures were pansensitive and ID was messaged for the gram-negative rod bacteremia pilot and will have Dr. Baxter Flattery make some recommendations this has been changed to p.o. cephalexin 5 mg every 8  for total of 10 doses starting 12/29/2020 -Sepsis physiology is improving and will need to continue monitor carefully -WBC went from 21 and is now 12.9 -PT/OT Consulted and Recommending Home Health PT  Multifactorial AKI None anion gap metabolic acidosis -In the setting of hypovolemia, obstruction and infection -Suspect from some azotemia but may have some nongap RTA -Renal function is improving significantly and went from 65/3.88 -> 72/3.58 -> 66/2.53 -> 50/1.84 -Avoid nephrotoxic medications, contrast dyes, hypotension and IV fluid hydration has not been K viewed that she is getting 100 cc per LR.  Likely volume overload in setting of fluid boluses we will now give IV Lasix to get back to dry weight -Bicarbonate drip is now stopped 6 4 -Continue to Monitor and Trend Renal Fxn Carefully -Repeat CMP in the AM  -Foley catheter has now been removed  Normocytic Anemia -Dilutional drop -Patient's hemoglobin/hematocrit went from 12.6/38.5 and is now at 9.3/33.6 -Check anemia panel in a.m. -Continue to monitor for signs and symptoms of bleeding; currently no overt bleeding noted -Repeat CBC in a.m.  Hypokalemia -Patient's potassium now 3.4 -Replete with po KCl 40 mEQ BID x2 -Continue to Monitor and Replete as Necessary -Repeat CMP in the AM  A Fib with RVR Elevated Troponin -Driven by sepsis and demand ischemia -C/w Dilitazem 120 mg po q12h and C/w Apixaban 2.5 mg po BID -Continue to Monitor on Telemetry  -She is now off of amiodarone and started on p.o. Cardizem  -Follows up with Dr. Percival Spanish in outpatient setting  Acute Respiratory Failure with Hypoxia -Improving -SpO2: 97 % O2 Flow Rate (L/min): 2 L/min FiO2 (%): 45 % -Continuous pulse oximetry maintain O2 saturation greater than  92% -Continue supplemental oxygen via nasal cannula and wean O2 as tolerated -She will need an ambulatory home O2 screen prior to discharge  Unprovoked VTE -Continue with Eliquis 2.5 mg p.o. twice  daily  HTN -Home Medications include Amlodipine and Lasix; her ARB is currently held due to her renal function -Will give a dose of IV Lasix -C/w po Cardizem -Continue to Monitor BP per Protocol -Last BP was 139/67  Diabetes Mellitus Type 2 -Diet controlled at Home -Switch Moderate Novolog SSI to AC/HS -Continue to Monitor CBGs per protocol and now ranging from 92-133  HLD -C/w Atorvastatin 20 mg po qHS  Gout -Allopurinol is Currently on Hold Due to Renal Fn  Hypothyroidism -C/w Home Euthyrox  Obesity -Complicates overall prognosis and Care -Estimated body mass index is 38.9 kg/m as calculated from the following:   Height as of this encounter: 5\' 3"  (1.6 m).   Weight as of this encounter: 99.6 kg. -Weight Loss and Dietary Counseling given   DVT prophylaxis: Anticoagulated with Apixaban Code Status: FULL CODE  Family Communication: Discussed with Daughter at bedside Disposition Plan: Pending further clinical improvement and clearance by urology as well as improvement in her renal function  Status is: Inpatient  Remains inpatient appropriate because:Unsafe d/c plan, IV treatments appropriate due to intensity of illness or inability to take PO and Inpatient level of care appropriate due to severity of illness   Dispo: The patient is from: Home              Anticipated d/c is to: Home              Patient currently is not medically stable to d/c.   Difficult to place patient No  Consultants:   Urology  PCCM Transfer  ID (Informal Consultation)   Procedures:  Procedure(s) by Dr. Rexene Alberts: 1.  Cystoscopy 2. Right retrograde pyelogram with interpretation 3. Right ureteral stent placement 4. Fluoroscopy <1 hour with intraoperative interpretation  Antimicrobials:  Anti-infectives (From admission, onward)   Start     Dose/Rate Route Frequency Ordered Stop   12/29/20 2200  cephALEXin (KEFLEX) capsule 500 mg        500 mg Oral Every 8 hours 12/28/20 1519  01/01/21 2359   12/25/20 2000  cefTRIAXone (ROCEPHIN) 2 g in sodium chloride 0.9 % 100 mL IVPB  Status:  Discontinued        2 g 200 mL/hr over 30 Minutes Intravenous Every 24 hours 12/25/20 0027 12/25/20 1837   12/25/20 2000  cefTRIAXone (ROCEPHIN) 2 g in sodium chloride 0.9 % 100 mL IVPB        2 g 200 mL/hr over 30 Minutes Intravenous Every 24 hours 12/25/20 1900 12/28/20 2359   12/25/20 1945  cefTRIAXone (ROCEPHIN) 2 g in sodium chloride 0.9 % 100 mL IVPB  Status:  Discontinued        2 g 200 mL/hr over 30 Minutes Intravenous  Once 12/25/20 1856 12/25/20 1900   12/25/20 1900  piperacillin-tazobactam (ZOSYN) IVPB 2.25 g  Status:  Discontinued        2.25 g 100 mL/hr over 30 Minutes Intravenous STAT 12/25/20 1847 12/25/20 1856   12/24/20 1830  cefTRIAXone (ROCEPHIN) 2 g in sodium chloride 0.9 % 100 mL IVPB        2 g 200 mL/hr over 30 Minutes Intravenous  Once 12/24/20 1825 12/24/20 2025        Subjective: Seen and examined at bedside and she is feeling better.  Complained of  some right-sided back pain.  Felt puffy in her hands.  No nausea or vomiting.  Denies any lightheadedness or dizziness.  No other concerns or complaints at this time.  Objective: Vitals:   12/28/20 1241 12/28/20 1300 12/28/20 1400 12/28/20 1608  BP:  126/71 139/67   Pulse:  85 76   Resp:  19 (!) 25   Temp: 98.2 F (36.8 C)   98 F (36.7 C)  TempSrc:      SpO2:  96% 97%   Weight:      Height:        Intake/Output Summary (Last 24 hours) at 12/28/2020 1718 Last data filed at 12/28/2020 1609 Gross per 24 hour  Intake 49.28 ml  Output 1700 ml  Net -1650.72 ml   Filed Weights   12/24/20 1636 12/25/20 0433 12/26/20 0346  Weight: 94.8 kg 99.6 kg 99.6 kg   Examination: Physical Exam:  Constitutional: WN/WD obese Caucasian female currently sitting the chair at bedside and appears in no acute distress appears calm and comfortable,  Eyes: Lids and conjunctivae normal, sclerae anicteric  ENMT: External  Ears, Nose appear normal. Grossly normal hearing.  Neck: Appears normal, supple, no cervical masses, normal ROM, no appreciable thyromegaly; no JVD Respiratory: Diminished to auscultation bilaterally with coarse breath sounds, no wheezing, rales, rhonchi or crackles. Normal respiratory effort and patient is not tachypenic. No accessory muscle use.  Unlabored breathing but wearing supplemental oxygen via nasal cannula Cardiovascular: Irregularly irregular and slightly tachycardic, no murmurs / rubs / gallops. S1 and S2 auscultated.  1+ lower extremity edema and upper extremity edema Abdomen: Soft, non-tender, distended secondary body habitus. Bowel sounds positive.  GU: Deferred. Musculoskeletal: No clubbing / cyanosis of digits/nails. No joint deformity upper and lower extremities.  Skin: No rashes, lesions, ulcers on limited skin evaluation. No induration; Warm and dry.  Neurologic: CN 2-12 grossly intact with no focal deficits. Romberg sign and cerebellar reflexes not assessed.  Psychiatric: Normal judgment and insight. Alert and oriented x 3. Normal mood and appropriate affect.   Data Reviewed: I have personally reviewed following labs and imaging studies  CBC: Recent Labs  Lab 12/24/20 1719 12/25/20 0253 12/26/20 0500 12/27/20 0209 12/28/20 0255  WBC 15.6* 21.0* 13.3* 11.6* 12.9*  HGB 12.9 12.6 10.3* 10.5* 11.3*  HCT 38.6 38.5 30.3* 30.9* 33.6*  MCV 94.8 98.2 90.4 90.9 92.3  PLT 61* 61* 45* 51* 66*   Basic Metabolic Panel: Recent Labs  Lab 12/25/20 0253 12/25/20 1556 12/25/20 2034 12/26/20 0041 12/26/20 0500 12/27/20 0209 12/28/20 0255  NA 133* 134* 135 134* 134* 136 139  K 4.3 3.3* 3.0* 3.5 3.3* 3.2* 3.4*  CL 106 102 101 101 99 101 105  CO2 14* 20* 22 21* 23 24 26   GLUCOSE 128* 109* 120* 91 96 107* 121*  BUN 65* 73* 75* 73* 72* 66* 50*  CREATININE 3.88* 4.19* 3.83* 3.91* 3.58* 2.53* 1.84*  CALCIUM 8.7* 8.4* 8.4* 8.4* 8.4* 9.0 9.5  MG 1.6* 2.1  --   --   --   --   --    PHOS 4.1  --   --   --   --   --   --    GFR: Estimated Creatinine Clearance: 27.9 mL/min (A) (by C-G formula based on SCr of 1.84 mg/dL (H)). Liver Function Tests: Recent Labs  Lab 12/24/20 1812  AST 23  ALT 19  ALKPHOS 123  BILITOT 1.0  PROT 6.4*  ALBUMIN 3.4*   Recent Labs  Lab 12/24/20 1812  LIPASE 15   No results for input(s): AMMONIA in the last 168 hours. Coagulation Profile: Recent Labs  Lab 12/24/20 1812  INR 2.2*   Cardiac Enzymes: No results for input(s): CKTOTAL, CKMB, CKMBINDEX, TROPONINI in the last 168 hours. BNP (last 3 results) No results for input(s): PROBNP in the last 8760 hours. HbA1C: No results for input(s): HGBA1C in the last 72 hours. CBG: Recent Labs  Lab 12/27/20 2345 12/28/20 0341 12/28/20 0745 12/28/20 1149 12/28/20 1536  GLUCAP 87 111* 110* 106* 126*   Lipid Profile: Recent Labs    12/26/20 0500  TRIG 193*   Thyroid Function Tests: No results for input(s): TSH, T4TOTAL, FREET4, T3FREE, THYROIDAB in the last 72 hours. Anemia Panel: No results for input(s): VITAMINB12, FOLATE, FERRITIN, TIBC, IRON, RETICCTPCT in the last 72 hours. Sepsis Labs: Recent Labs  Lab 12/24/20 1812 12/25/20 0253 12/25/20 0531  LATICACIDVEN 3.4* 3.0* 2.8*    Recent Results (from the past 240 hour(s))  Resp Panel by RT-PCR (Flu A&B, Covid) Nasopharyngeal Swab     Status: None   Collection Time: 12/24/20  4:42 PM   Specimen: Nasopharyngeal Swab; Nasopharyngeal(NP) swabs in vial transport medium  Result Value Ref Range Status   SARS Coronavirus 2 by RT PCR NEGATIVE NEGATIVE Final    Comment: (NOTE) SARS-CoV-2 target nucleic acids are NOT DETECTED.  The SARS-CoV-2 RNA is generally detectable in upper respiratory specimens during the acute phase of infection. The lowest concentration of SARS-CoV-2 viral copies this assay can detect is 138 copies/mL. A negative result does not preclude SARS-Cov-2 infection and should not be used as the sole  basis for treatment or other patient management decisions. A negative result may occur with  improper specimen collection/handling, submission of specimen other than nasopharyngeal swab, presence of viral mutation(s) within the areas targeted by this assay, and inadequate number of viral copies(<138 copies/mL). A negative result must be combined with clinical observations, patient history, and epidemiological information. The expected result is Negative.  Fact Sheet for Patients:  EntrepreneurPulse.com.au  Fact Sheet for Healthcare Providers:  IncredibleEmployment.be  This test is no t yet approved or cleared by the [place] FDA and  has been authorized for detection and/or diagnosis of SARS-CoV-2 by FDA under an Emergency Use Authorization (EUA). This EUA will remain  in effect (meaning this test can be used) for the duration of the COVID-19 declaration under Section 564(b)(1) of the Act, 21 U.S.C.section 360bbb-3(b)(1), unless the authorization is terminated  or revoked sooner.       Influenza A by PCR NEGATIVE NEGATIVE Final   Influenza B by PCR NEGATIVE NEGATIVE Final    Comment: (NOTE) The Xpert Xpress SARS-CoV-2/FLU/RSV plus assay is intended as an aid in the diagnosis of influenza from Nasopharyngeal swab specimens and should not be used as a sole basis for treatment. Nasal washings and aspirates are unacceptable for Xpert Xpress SARS-CoV-2/FLU/RSV testing.  Fact Sheet for Patients: EntrepreneurPulse.com.au  Fact Sheet for Healthcare Providers: IncredibleEmployment.be  This test is not yet approved or cleared by the [place] FDA and has been authorized for detection and/or diagnosis of SARS-CoV-2 by FDA under an Emergency Use Authorization (EUA). This EUA will remain in effect (meaning this test can be used) for the duration of the COVID-19 declaration under Section 564(b)(1) of the Act,  21 U.S.C. section 360bbb-3(b)(1), unless the authorization is terminated or revoked.  Performed at [facility], [address] [place], [place] [address]   Blood culture (routine single)  Status: Abnormal   Collection Time: 12/24/20  6:12 PM   Specimen: BLOOD  Result Value Ref Range Status   Specimen Description   Final    BLOOD BOTTLES DRAWN AEROBIC AND ANAEROBIC Blood Culture adequate volume RIGHT ANTECUBITAL Performed at [facility], [address] [place], [place] [address]    Special Requests   Final    NONE Performed at [facility], [address], [place], [place] [address]    Culture  Setup Time   Final    GRAM NEGATIVE RODS BOTH AEROBIC AND ANAEROBIC BOTTLES Gram Stain Report Called to,Read Back By and Verified With: A KOONTZ @ [facility] 12/25/20 @ 0849 BY S BEARD. CRITICAL RESULT CALLED TO, READ BACK BY AND VERIFIED WITHMelodye Ped [facility] 8127 12/25/20 A BROWNING Performed at [facility], [place] [address], [place],  [address]    Culture ESCHERICHIA COLI (A)  Final   Report Status 12/27/2020 FINAL  Final   Organism ID, Bacteria ESCHERICHIA COLI  Final      Susceptibility   Escherichia coli - MIC*    AMPICILLIN 8 SENSITIVE Sensitive     CEFAZOLIN <=4 SENSITIVE Sensitive     CEFEPIME <=0.12 SENSITIVE Sensitive     CEFTAZIDIME <=1 SENSITIVE Sensitive     CEFTRIAXONE <=0.25 SENSITIVE Sensitive     CIPROFLOXACIN <=0.25 SENSITIVE Sensitive     GENTAMICIN <=1 SENSITIVE Sensitive     IMIPENEM 0.5 SENSITIVE Sensitive     TRIMETH/SULFA <=20 SENSITIVE Sensitive     AMPICILLIN/SULBACTAM 4 SENSITIVE Sensitive     PIP/TAZO <=4 SENSITIVE Sensitive     * ESCHERICHIA COLI  Blood Culture ID Panel (Reflexed)     Status: Abnormal   Collection Time: 12/24/20  6:12 PM  Result Value Ref Range Status   Enterococcus faecalis NOT DETECTED NOT DETECTED Final   Enterococcus Faecium NOT DETECTED NOT DETECTED Final   Listeria monocytogenes NOT DETECTED NOT DETECTED Final    Staphylococcus species NOT DETECTED NOT DETECTED Final   Staphylococcus aureus (BCID) NOT DETECTED NOT DETECTED Final   Staphylococcus epidermidis NOT DETECTED NOT DETECTED Final   Staphylococcus lugdunensis NOT DETECTED NOT DETECTED Final   Streptococcus species NOT DETECTED NOT DETECTED Final   Streptococcus agalactiae NOT DETECTED NOT DETECTED Final   Streptococcus pneumoniae NOT DETECTED NOT DETECTED Final   Streptococcus pyogenes NOT DETECTED NOT DETECTED Final   A.calcoaceticus-baumannii NOT DETECTED NOT DETECTED Final   Bacteroides fragilis NOT DETECTED NOT DETECTED Final   Enterobacterales DETECTED (A) NOT DETECTED Final    Comment: Enterobacterales represent a large order of gram negative bacteria, not a single organism. CRITICAL RESULT CALLED TO, READ BACK BY AND VERIFIED WITH: Melodye Ped PHARMD 1749 12/25/20 A BROWNING    Enterobacter cloacae complex NOT DETECTED NOT DETECTED Final   Escherichia coli DETECTED (A) NOT DETECTED Final    Comment: CRITICAL RESULT CALLED TO, READ BACK BY AND VERIFIED WITH: Melodye Ped PHARMD [id number] 12/25/20 A BROWNING    Klebsiella aerogenes NOT DETECTED NOT DETECTED Final   Klebsiella oxytoca NOT DETECTED NOT DETECTED Final   Klebsiella pneumoniae NOT DETECTED NOT DETECTED Final   Proteus species NOT DETECTED NOT DETECTED Final   Salmonella species NOT DETECTED NOT DETECTED Final   Serratia marcescens NOT DETECTED NOT DETECTED Final   Haemophilus influenzae NOT DETECTED NOT DETECTED Final   Neisseria meningitidis NOT DETECTED NOT DETECTED Final   Pseudomonas aeruginosa NOT DETECTED NOT DETECTED Final   Stenotrophomonas maltophilia NOT DETECTED NOT DETECTED Final  Candida albicans NOT DETECTED NOT DETECTED Final   Candida auris NOT DETECTED NOT DETECTED Final   Candida glabrata NOT DETECTED NOT DETECTED Final   Candida krusei NOT DETECTED NOT DETECTED Final   Candida parapsilosis NOT DETECTED NOT DETECTED Final   Candida tropicalis NOT DETECTED NOT  DETECTED Final   Cryptococcus neoformans/gattii NOT DETECTED NOT DETECTED Final   CTX-M ESBL NOT DETECTED NOT DETECTED Final   Carbapenem resistance IMP NOT DETECTED NOT DETECTED Final   Carbapenem resistance KPC NOT DETECTED NOT DETECTED Final   Carbapenem resistance NDM NOT DETECTED NOT DETECTED Final   Carbapenem resist OXA 48 LIKE NOT DETECTED NOT DETECTED Final   Carbapenem resistance VIM NOT DETECTED NOT DETECTED Final    Comment: Performed at [facility], [address], [place], [place] [address]  Urine culture     Status: Abnormal   Collection Time: 12/24/20  9:36 PM   Specimen: In/Out Cath Urine  Result Value Ref Range Status   Specimen Description   Final    IN/OUT CATH URINE Performed at [facility], [address], [place], [place] [address]    Special Requests   Final    NONE Performed at [facility], [address] [place], [place] [address]    Culture 50,000 COLONIES/mL ESCHERICHIA COLI (A)  Final   Report Status 12/27/2020 FINAL  Final   Organism ID, Bacteria ESCHERICHIA COLI (A)  Final      Susceptibility   Escherichia coli - MIC*    AMPICILLIN 8 SENSITIVE Sensitive     CEFAZOLIN <=4 SENSITIVE Sensitive     CEFEPIME <=0.12 SENSITIVE Sensitive     CEFTRIAXONE <=0.25 SENSITIVE Sensitive     CIPROFLOXACIN <=0.25 SENSITIVE Sensitive     GENTAMICIN <=1 SENSITIVE Sensitive     IMIPENEM <=0.25 SENSITIVE Sensitive     NITROFURANTOIN <=16 SENSITIVE Sensitive     TRIMETH/SULFA <=20 SENSITIVE Sensitive     AMPICILLIN/SULBACTAM 4 SENSITIVE Sensitive     PIP/TAZO <=4 SENSITIVE Sensitive     * 50,000 COLONIES/mL ESCHERICHIA COLI  MRSA PCR Screening     Status: None   Collection Time: 12/25/20  2:17 AM   Specimen: Nasopharyngeal  Result Value Ref Range Status   MRSA by PCR NEGATIVE NEGATIVE Final    Comment:        The GeneXpert MRSA Assay (FDA approved for NASAL specimens only), is one component of a comprehensive MRSA colonization surveillance program. It  is not intended to diagnose MRSA infection nor to guide or monitor treatment for MRSA infections. Performed at [facility], [place] [address] [place], [place] [address]   Culture, blood (routine x 2)     Status: None (Preliminary result)   Collection Time: 12/25/20 12:18 PM   Specimen: BLOOD RIGHT HAND  Result Value Ref Range Status   Specimen Description   Final    BLOOD RIGHT HAND Performed at [facility] [address] [place], [place] [address]    Special Requests   Final    BOTTLES DRAWN AEROBIC AND ANAEROBIC Blood Culture adequate volume Performed at [facility] [address], [place], [place] [address]    Culture   Final    NO GROWTH 3 DAYS Performed at [facility], [place] [address], [place],  [address]    Report Status PENDING  Incomplete  Culture, blood (routine x 2)     Status: None (Preliminary result)   Collection Time: 12/25/20 12:18 PM  Specimen: BLOOD LEFT HAND  Result Value Ref Range Status   Specimen Description   Final    BLOOD LEFT HAND Performed at [facility] [address], [place], [place] [address]    Special Requests   Final    BOTTLES DRAWN AEROBIC AND ANAEROBIC Blood Culture adequate volume Performed at [facility] [address], [place], [place] [address]    Culture   Final    NO GROWTH 3 DAYS Performed at [facility], [place] [address], [place],  [address]    Report Status PENDING  Incomplete     RN Pressure Injury Documentation:     Estimated body mass index is 38.9 kg/m as calculated from the following:   Height as of this encounter: 5\' 3"  (1.6 m).   Weight as of this encounter: 99.6 kg.  Malnutrition Type:   Malnutrition Characteristics:   Nutrition Interventions:    Radiology Studies: No results found.  Scheduled Meds: . apixaban  2.5 mg Oral BID  . atorvastatin  20 mg Oral Daily  . [START ON  12/29/2020] cephALEXin  500 mg Oral Q8H  . chlorhexidine  15 mL Mouth Rinse BID  . Chlorhexidine Gluconate Cloth  6 each Topical Daily  . diltiazem  120 mg Oral Q12H  . insulin aspart  0-15 Units Subcutaneous TID WC  . insulin aspart  0-5 Units Subcutaneous QHS  . mouth rinse  15 mL Mouth Rinse q12n4p  . potassium chloride  40 mEq Oral BID   Continuous Infusions: . sodium chloride Stopped (12/25/20 1542)  . cefTRIAXone (ROCEPHIN)  IV Stopped (12/27/20 2145)  . lactated ringers Stopped (12/27/20 1510)     LOS: 3 days   Kerney Elbe, DO [facility] PAGER is on [place]  If 7PM-7AM, please contact night-coverage [email or web address]

## 2020-12-29 LAB — BASIC METABOLIC PANEL
Anion gap: 7 (ref 5–15)
BUN: 32 mg/dL — ABNORMAL HIGH (ref 8–23)
CO2: 26 mmol/L (ref 22–32)
Calcium: 9.4 mg/dL (ref 8.9–10.3)
Chloride: 103 mmol/L (ref 98–111)
Creatinine, Ser: 1.31 mg/dL — ABNORMAL HIGH (ref 0.44–1.00)
GFR, Estimated: 41 mL/min — ABNORMAL LOW (ref >60)
Glucose, Bld: 116 mg/dL — ABNORMAL HIGH (ref 70–99)
Potassium: 3.9 mmol/L (ref 3.5–5.1)
Sodium: 136 mmol/L (ref 135–145)

## 2020-12-29 LAB — CBC
HCT: 31.8 % — ABNORMAL LOW (ref 36.0–46.0)
Hemoglobin: 10.7 g/dL — ABNORMAL LOW (ref 12.0–15.0)
MCH: 30.9 pg (ref 26.0–34.0)
MCHC: 33.6 g/dL (ref 30.0–36.0)
MCV: 91.9 fL (ref 80.0–100.0)
Platelets: 93 10*3/uL — ABNORMAL LOW (ref 150–400)
RBC: 3.46 MIL/uL — ABNORMAL LOW (ref 3.87–5.11)
RDW: 14.3 % (ref 11.5–15.5)
WBC: 13.7 10*3/uL — ABNORMAL HIGH (ref 4.0–10.5)
nRBC: 0 % (ref 0.0–0.2)

## 2020-12-29 LAB — GLUCOSE, CAPILLARY
Glucose-Capillary: 100 mg/dL — ABNORMAL HIGH (ref 70–99)
Glucose-Capillary: 122 mg/dL — ABNORMAL HIGH (ref 70–99)
Glucose-Capillary: 115 mg/dL — ABNORMAL HIGH (ref 70–99)
Glucose-Capillary: 89 mg/dL (ref 70–99)

## 2020-12-29 MED ORDER — FUROSEMIDE 10 MG/ML IJ SOLN
40.0000 mg | Freq: Once | INTRAMUSCULAR | Status: AC
  Administered 2020-12-29: 40 mg via INTRAVENOUS
  Filled 2020-12-29: qty 4

## 2020-12-29 NOTE — Progress Notes (Signed)
Physical Therapy Treatment Patient Details Name: [name] [name] MRN: [id number] DOB: 1941-03-27 Today's Date: 12/29/2020    History of Present Illness 80 yo female admitted with septic shock. S/P ureteral stent placement 6/3. Hx of OA, R THA-DA 2015, L THA 2012, obesity, DM, DVT, PE, gout, Afib.    PT Comments    Progressing with mobility. Encouraged pt to perform long arc quads and ankle pumps when legs are down for brief periods. Otherwise, instructed her to keep elevating them to help with swelling.   Follow Up Recommendations  Home health PT;Supervision/Assistance - 24 hour     Equipment Recommendations  Wheelchair;Wheelchair cushion;3in1 (PT) (if pt/family feel it would be beneficial. may not need if she continues to progress well.)    Recommendations for Other Services       Precautions / Restrictions Precautions Precautions: Fall Restrictions Weight Bearing Restrictions: No    Mobility  Bed Mobility               General bed mobility comments: oob in recliner    Transfers Overall transfer level: Needs assistance Equipment used: Rolling walker (2 wheeled) Transfers: Sit to/from Stand Sit to Stand: Min assist         General transfer comment: Assist to stabilize/steady. Increased time. x2 for strengthening, practice.  Ambulation/Gait Ambulation/Gait assistance: Min assist;+2 safety/equipment Gait Distance (Feet): 7 Feet (x2) Assistive device: Rolling walker (2 wheeled) Gait Pattern/deviations: Step-through pattern;Decreased stride length;Trunk flexed     General Gait Details: Assist to steady and follow with recliner. Slow gait. Distance limited by lines. Pt reports discomfort in LEs/knees.   Stairs             Wheelchair Mobility    Modified Rankin (Stroke Patients Only)       Balance Overall balance assessment: Needs assistance         Standing balance support: Bilateral upper extremity supported Standing balance-Leahy  Scale: Poor                              Cognition Arousal/Alertness: Awake/alert Behavior During Therapy: WFL for tasks assessed/performed Overall Cognitive Status: Within Functional Limits for tasks assessed                                        Exercises      General Comments        Pertinent Vitals/Pain Pain Assessment: Faces Faces Pain Scale: Hurts even more Pain Location: UEs and UEs Pain Descriptors / Indicators: Discomfort;Sore Pain Intervention(s): Limited activity within patient's tolerance;Monitored during session;Repositioned    Home Living                      Prior Function            PT Goals (current goals can now be found in the care plan section) Progress towards PT goals: Progressing toward goals    Frequency    Min 3X/week      PT Plan Current plan remains appropriate    Co-evaluation              AM-PAC PT "6 Clicks" Mobility   Outcome Measure  Help needed turning from your back to your side while in a flat bed without using bedrails?: A Little Help needed moving from lying on your back to sitting on the  side of a flat bed without using bedrails?: A Little Help needed moving to and from a bed to a chair (including a wheelchair)?: A Little Help needed standing up from a chair using your arms (e.g., wheelchair or bedside chair)?: A Little Help needed to walk in hospital room?: A Little Help needed climbing 3-5 steps with a railing? : A Lot 6 Click Score: 17    End of Session   Activity Tolerance: Patient limited by pain;Patient limited by fatigue Patient left: in chair;with call bell/phone within reach;with family/visitor present   PT Visit Diagnosis: Muscle weakness (generalized) (M62.81);Difficulty in walking, not elsewhere classified (R26.2)     Time: 1155-2080 PT Time Calculation (min) (ACUTE ONLY): 13 min  Charges:  $Gait Training: 8-22 mins                         Doreatha Massed,  PT Acute Rehabilitation  Office: [phone] Pager: [phone]

## 2020-12-29 NOTE — Progress Notes (Signed)
PROGRESS NOTE    [name] [name]  HYW:[id number] DOB: 07/17/1941 DOA: 12/24/2020 PCP: Loman Brooklyn, FNP   Brief Narrative:  Patient is a 80 year old Caucasian obese female with past medical history significant for but not limited to hypertension, gout, history of an unprovoked PE on anticoagulation, diabetes mellitus type 2 not on insulin as well as other comorbidities including proximal afibrillation who presented to [facility], ED with right-sided flank pain and decreased oral intake secondary to urosepsis with ureteral lateral stone and hydronephrosis on CT abdomen pelvis.  She subsequently developed A. fib with RVR became hypotensive and started on Cardizem and then improved with IV amiodarone.  She was found to be in septic shock and subsequently underwent right ureteral stenting and amiodarone was discontinued on 12/27/2020.  On 12/28/2020 she was transferred to [facility] service.  She complains of back pain still but this is improving.  Sepsis physiology is improving.  Anticoagulation has now been resumed and she is now on p.o. Cardizem.  ID has now changed to po Abx. She remains volume overloaded so will give another dose of IV Lasix today. Will need an Ambulatory Home O2 Screen prior to D/C and repeat CXR in the AM.   Assessment & Plan:   Active Problems:   Septic shock (HCC)  Septic shock secondary to urinary tract infection in the setting of Right Proximal Ureteral Stone Causing Right Hydrouretonerphrosis, present on admission, improving  -She presented with tachypnea, hypotensive, tachycardia, leukocytosis and lactic acidosis with concern for Infection given her renal stone and concern for septic shock -She ended up on pressors for short-term and then subsequently weaned off -Blood cultures x2 showed E. coli present -Underwent ureteral stenting by urology and cultures growing Enterobacter and E. coli from 6 2 and 6 3 but currently no growth to date -Currently on IV ceftriaxone and  urine cultures were pansensitive and ID was messaged for the gram-negative rod bacteremia pilot and will have Dr. Baxter Flattery make some recommendations this has been changed to p.o. cephalexin 5 mg every 8 for total of 10 doses starting 12/29/2020 -Sepsis physiology is improving and will need to continue monitor carefully -WBC went from 21 -> 12.9 yesterday and is now 13.7 today  -PT/OT Consulted and Recommending Home Health PT  Multifactorial AKI, improving  None anion gap metabolic acidosis, improved  -In the setting of hypovolemia, obstruction and infection -Suspect from some azotemia but may have some nongap RTA -Renal function is improving significantly and went from 65/3.88 -> 72/3.58 -> 66/2.53 -> 50/1.84 -> 32/1.31 -Avoid nephrotoxic medications, contrast dyes, hypotension and IV fluid hydration has now discontinued. -Likely volume overload in setting of fluid boluses we will now give IV Lasix to get back to dry weight -Bicarbonate drip is now stopped 6 4 -Continue to Monitor and Trend Renal Fxn Carefully -Repeat CMP in the AM  -Foley catheter has now been removed  Normocytic Anemia -Dilutional drop -Patient's hemoglobin/hematocrit went from 12.6/38.5 and is now at 9.3/33.6 yesterday and today is now 10.7/31.8 -Check anemia panel in a.m. -Continue to monitor for signs and symptoms of bleeding; currently no overt bleeding noted -Repeat CBC in a.m.  Hypokalemia -Patient's potassium now 3.9 -Continue to Monitor and Replete as Necessary -Repeat CMP in the AM  A Fib with RVR Elevated Troponin -Driven by sepsis and demand ischemia -C/w Dilitazem 120 mg po q12h and C/w Apixaban 2.5 mg po BID -Continue to Monitor on Telemetry  -She is now off of amiodarone and started on p.o.  Cardizem  -Follows up with Dr. Percival Spanish in outpatient setting  Acute Respiratory Failure with Hypoxia -Improving -SpO2: 98 % O2 Flow Rate (L/min): 2 L/min FiO2 (%): 45 % -Continuous pulse oximetry maintain O2  saturation greater than 92% -Continue supplemental oxygen via nasal cannula and wean O2 as tolerated -She will need an ambulatory home O2 screen prior to discharge and repeat CXR in the AM   Unprovoked VTE -Continue with Eliquis 2.5 mg p.o. twice daily  HTN -Home Medications include Amlodipine and Lasix; her ARB is currently held due to her renal function -Will give a dose of IV Lasix again  -C/w po Cardizem -Continue to Monitor BP per Protocol -Last BP was 149/85  Diabetes Mellitus Type 2 -Diet controlled at Home -Switch Moderate Novolog SSI to AC/HS -Continue to Monitor CBGs per protocol and now ranging from 100-126  HLD -C/w Atorvastatin 20 mg po qHS  Gout -Allopurinol is Currently on Hold Due to Renal Fn  Hypothyroidism -C/w Home Euthyrox  Obesity -Complicates overall prognosis and Care -Estimated body mass index is 38.9 kg/m as calculated from the following:   Height as of this encounter: 5\' 3"  (1.6 m).   Weight as of this encounter: 99.6 kg. -Weight Loss and Dietary Counseling given   DVT prophylaxis: Anticoagulated with Apixaban Code Status: FULL CODE  Family Communication: Discussed with Daughter in Law at bedside  Disposition Plan: Pending further clinical improvement and clearance by urology as well as improvement in her renal function  Status is: Inpatient  Remains inpatient appropriate because:Unsafe d/c plan, IV treatments appropriate due to intensity of illness or inability to take PO and Inpatient level of care appropriate due to severity of illness   Dispo: The patient is from: Home              Anticipated d/c is to: Home              Patient currently is not medically stable to d/c.   Difficult to place patient No  Consultants:   Urology  PCCM Transfer  ID (Informal Consultation)   Procedures:  Procedure(s) by Dr. Rexene Alberts: 1.  Cystoscopy 2. Right retrograde pyelogram with interpretation 3. Right ureteral stent placement 4.  Fluoroscopy <1 hour with intraoperative interpretation  Antimicrobials:  Anti-infectives (From admission, onward)   Start     Dose/Rate Route Frequency Ordered Stop   12/29/20 2200  cephALEXin (KEFLEX) capsule 500 mg        500 mg Oral Every 8 hours 12/28/20 1519 01/01/21 2359   12/25/20 2000  cefTRIAXone (ROCEPHIN) 2 g in sodium chloride 0.9 % 100 mL IVPB  Status:  Discontinued        2 g 200 mL/hr over 30 Minutes Intravenous Every 24 hours 12/25/20 0027 12/25/20 1837   12/25/20 2000  cefTRIAXone (ROCEPHIN) 2 g in sodium chloride 0.9 % 100 mL IVPB        2 g 200 mL/hr over 30 Minutes Intravenous Every 24 hours 12/25/20 1900 12/28/20 2040   12/25/20 1945  cefTRIAXone (ROCEPHIN) 2 g in sodium chloride 0.9 % 100 mL IVPB  Status:  Discontinued        2 g 200 mL/hr over 30 Minutes Intravenous  Once 12/25/20 1856 12/25/20 1900   12/25/20 1900  piperacillin-tazobactam (ZOSYN) IVPB 2.25 g  Status:  Discontinued        2.25 g 100 mL/hr over 30 Minutes Intravenous STAT 12/25/20 1847 12/25/20 1856   12/24/20 1830  cefTRIAXone (ROCEPHIN) 2 g  in sodium chloride 0.9 % 100 mL IVPB        2 g 200 mL/hr over 30 Minutes Intravenous  Once 12/24/20 1825 12/24/20 2025        Subjective: Seen and examined at bedside and thinks she is doing better.  No more flank pain.  Just had a bowel movement earlier yesterday.  Still feels puffy and states this is the most that she is ever been swollen.  Feels okay.  No other concerns or complaints at this time.  Objective: Vitals:   12/28/20 2139 12/29/20 0200 12/29/20 0652 12/29/20 0955  BP: (!) 162/68 (!) 159/82 125/80 (!) 149/85  Pulse: 95 94 91 100  Resp: 20 20 20  (!) 25  Temp: 99.7 F (37.6 C) 98.3 F (36.8 C) 97.7 F (36.5 C) 99.1 F (37.3 C)  TempSrc: Oral Oral Oral Oral  SpO2: 92% 98% 96% 98%  Weight:      Height:        Intake/Output Summary (Last 24 hours) at 12/29/2020 1427 Last data filed at 12/29/2020 0900 Gross per 24 hour  Intake 120 ml   Output 2100 ml  Net -1980 ml   Filed Weights   12/24/20 1636 12/25/20 0433 12/26/20 0346  Weight: 94.8 kg 99.6 kg 99.6 kg   Examination: Physical Exam:  Constitutional: WN/WD obese Caucasian female currently appears calm and comfortable sitting at the edge of bed Eyes: Lids and conjunctivae normal, sclerae anicteric  ENMT: External Ears, Nose appear normal. Grossly normal hearing.  Neck: Appears normal, supple, no cervical masses, normal ROM, no appreciable thyromegaly; no JVD Respiratory: Diminished to auscultation bilaterally with coarse breath sounds, no wheezing, rales, rhonchi or crackles. Normal respiratory effort and patient is not tachypenic. No accessory muscle use.  No longer wearing supplemental oxygen via nasal cannula Cardiovascular: Irregularly irregular, no murmurs / rubs / gallops. S1 and S2 auscultated.  1+ lower extremity and upper extremity edema Abdomen: Soft, non-tender, distended secondary body habitus.  Bowel sounds positive.  GU: Deferred. Musculoskeletal: No clubbing / cyanosis of digits/nails. No joint deformity upper and lower extremities. Skin: No rashes, lesions, ulcers on limited skin evaluation. No induration; Warm and dry.  Neurologic: CN 2-12 grossly intact with no focal deficits. Romberg sign and cerebellar reflexes not assessed.  Psychiatric: Normal judgment and insight. Alert and oriented x 3. Normal mood and appropriate affect.   Data Reviewed: I have personally reviewed following labs and imaging studies  CBC: Recent Labs  Lab 12/25/20 0253 12/26/20 0500 12/27/20 0209 12/28/20 0255 12/29/20 0411  WBC 21.0* 13.3* 11.6* 12.9* 13.7*  HGB 12.6 10.3* 10.5* 11.3* 10.7*  HCT 38.5 30.3* 30.9* 33.6* 31.8*  MCV 98.2 90.4 90.9 92.3 91.9  PLT 61* 45* 51* 66* 93*   Basic Metabolic Panel: Recent Labs  Lab 12/25/20 0253 12/25/20 1556 12/25/20 2034 12/26/20 0041 12/26/20 0500 12/27/20 0209 12/28/20 0255 12/29/20 0411  NA 133* 134*   < > 134*  134* 136 139 136  K 4.3 3.3*   < > 3.5 3.3* 3.2* 3.4* 3.9  CL 106 102   < > 101 99 101 105 103  CO2 14* 20*   < > 21* 23 24 26 26   GLUCOSE 128* 109*   < > 91 96 107* 121* 116*  BUN 65* 73*   < > 73* 72* 66* 50* 32*  CREATININE 3.88* 4.19*   < > 3.91* 3.58* 2.53* 1.84* 1.31*  CALCIUM 8.7* 8.4*   < > 8.4* 8.4* 9.0 9.5 9.4  MG 1.6* 2.1  --   --   --   --   --   --   PHOS 4.1  --   --   --   --   --   --   --    < > = values in this interval not displayed.   GFR: Estimated Creatinine Clearance: 39.2 mL/min (A) (by C-G formula based on SCr of 1.31 mg/dL (H)). Liver Function Tests: Recent Labs  Lab 12/24/20 1812  AST 23  ALT 19  ALKPHOS 123  BILITOT 1.0  PROT 6.4*  ALBUMIN 3.4*   Recent Labs  Lab 12/24/20 1812  LIPASE 15   No results for input(s): AMMONIA in the last 168 hours. Coagulation Profile: Recent Labs  Lab 12/24/20 1812  INR 2.2*   Cardiac Enzymes: No results for input(s): CKTOTAL, CKMB, CKMBINDEX, TROPONINI in the last 168 hours. BNP (last 3 results) No results for input(s): PROBNP in the last 8760 hours. HbA1C: No results for input(s): HGBA1C in the last 72 hours. CBG: Recent Labs  Lab 12/28/20 1149 12/28/20 1536 12/28/20 2229 12/29/20 0739 12/29/20 1120  GLUCAP 106* 126* 114* 100* 122*   Lipid Profile: No results for input(s): CHOL, HDL, LDLCALC, TRIG, CHOLHDL, LDLDIRECT in the last 72 hours. Thyroid Function Tests: No results for input(s): TSH, T4TOTAL, FREET4, T3FREE, THYROIDAB in the last 72 hours. Anemia Panel: No results for input(s): VITAMINB12, FOLATE, FERRITIN, TIBC, IRON, RETICCTPCT in the last 72 hours. Sepsis Labs: Recent Labs  Lab 12/24/20 1812 12/25/20 0253 12/25/20 0531  LATICACIDVEN 3.4* 3.0* 2.8*    Recent Results (from the past 240 hour(s))  Resp Panel by RT-PCR (Flu A&B, Covid) Nasopharyngeal Swab     Status: None   Collection Time: 12/24/20  4:42 PM   Specimen: Nasopharyngeal Swab; Nasopharyngeal(NP) swabs in vial  transport medium  Result Value Ref Range Status   SARS Coronavirus 2 by RT PCR NEGATIVE NEGATIVE Final    Comment: (NOTE) SARS-CoV-2 target nucleic acids are NOT DETECTED.  The SARS-CoV-2 RNA is generally detectable in upper respiratory specimens during the acute phase of infection. The lowest concentration of SARS-CoV-2 viral copies this assay can detect is 138 copies/mL. A negative result does not preclude SARS-Cov-2 infection and should not be used as the sole basis for treatment or other patient management decisions. A negative result may occur with  improper specimen collection/handling, submission of specimen other than nasopharyngeal swab, presence of viral mutation(s) within the areas targeted by this assay, and inadequate number of viral copies(<138 copies/mL). A negative result must be combined with clinical observations, patient history, and epidemiological information. The expected result is Negative.  Fact Sheet for Patients:  EntrepreneurPulse.com.au  Fact Sheet for Healthcare Providers:  IncredibleEmployment.be  This test is no t yet approved or cleared by the [place] FDA and  has been authorized for detection and/or diagnosis of SARS-CoV-2 by FDA under an Emergency Use Authorization (EUA). This EUA will remain  in effect (meaning this test can be used) for the duration of the COVID-19 declaration under Section 564(b)(1) of the Act, 21 U.S.C.section 360bbb-3(b)(1), unless the authorization is terminated  or revoked sooner.       Influenza A by PCR NEGATIVE NEGATIVE Final   Influenza B by PCR NEGATIVE NEGATIVE Final    Comment: (NOTE) The Xpert Xpress SARS-CoV-2/FLU/RSV plus assay is intended as an aid in the diagnosis of influenza from Nasopharyngeal swab specimens and should not be used as a sole basis for treatment. Nasal  washings and aspirates are unacceptable for Xpert Xpress SARS-CoV-2/FLU/RSV testing.  Fact  Sheet for Patients: EntrepreneurPulse.com.au  Fact Sheet for Healthcare Providers: IncredibleEmployment.be  This test is not yet approved or cleared by the [place] FDA and has been authorized for detection and/or diagnosis of SARS-CoV-2 by FDA under an Emergency Use Authorization (EUA). This EUA will remain in effect (meaning this test can be used) for the duration of the COVID-19 declaration under Section 564(b)(1) of the Act, 21 U.S.C. section 360bbb-3(b)(1), unless the authorization is terminated or revoked.  Performed at [facility], [address], [place], [place] [address]   Blood culture (routine single)     Status: Abnormal   Collection Time: 12/24/20  6:12 PM   Specimen: BLOOD  Result Value Ref Range Status   Specimen Description   Final    BLOOD BOTTLES DRAWN AEROBIC AND ANAEROBIC Blood Culture adequate volume RIGHT ANTECUBITAL Performed at [facility], [address] [place], [place] [address]    Special Requests   Final    NONE Performed at [facility], [address], [place], [place] [address]    Culture  Setup Time   Final    GRAM NEGATIVE RODS BOTH AEROBIC AND ANAEROBIC BOTTLES Gram Stain Report Called to,Read Back By and Verified With: A KOONTZ @ [place] ICU 12/25/20 @ 0849 BY S BEARD. CRITICAL RESULT CALLED TO, READ BACK BY AND VERIFIED WITHMelodye Ped [facility] 4481 12/25/20 A BROWNING Performed at [facility], [place] [address], [place], [place] [address]    Culture ESCHERICHIA COLI (A)  Final   Report Status 12/27/2020 FINAL  Final   Organism ID, Bacteria ESCHERICHIA COLI  Final      Susceptibility   Escherichia coli - MIC*    AMPICILLIN 8 SENSITIVE Sensitive     CEFAZOLIN <=4 SENSITIVE Sensitive     CEFEPIME <=0.12 SENSITIVE Sensitive     CEFTAZIDIME <=1 SENSITIVE Sensitive     CEFTRIAXONE <=0.25 SENSITIVE Sensitive     CIPROFLOXACIN <=0.25 SENSITIVE Sensitive     GENTAMICIN <=1 SENSITIVE Sensitive     IMIPENEM 0.5  SENSITIVE Sensitive     TRIMETH/SULFA <=20 SENSITIVE Sensitive     AMPICILLIN/SULBACTAM 4 SENSITIVE Sensitive     PIP/TAZO <=4 SENSITIVE Sensitive     * ESCHERICHIA COLI  Blood Culture ID Panel (Reflexed)     Status: Abnormal   Collection Time: 12/24/20  6:12 PM  Result Value Ref Range Status   Enterococcus faecalis NOT DETECTED NOT DETECTED Final   Enterococcus Faecium NOT DETECTED NOT DETECTED Final   Listeria monocytogenes NOT DETECTED NOT DETECTED Final   Staphylococcus species NOT DETECTED NOT DETECTED Final   Staphylococcus aureus (BCID) NOT DETECTED NOT DETECTED Final   Staphylococcus epidermidis NOT DETECTED NOT DETECTED Final   Staphylococcus lugdunensis NOT DETECTED NOT DETECTED Final   Streptococcus species NOT DETECTED NOT DETECTED Final   Streptococcus agalactiae NOT DETECTED NOT DETECTED Final   Streptococcus pneumoniae NOT DETECTED NOT DETECTED Final   Streptococcus pyogenes NOT DETECTED NOT DETECTED Final   A.calcoaceticus-baumannii NOT DETECTED NOT DETECTED Final   Bacteroides fragilis NOT DETECTED NOT DETECTED Final   Enterobacterales DETECTED (A) NOT DETECTED Final    Comment: Enterobacterales represent a large order of gram negative bacteria, not a single organism. CRITICAL RESULT CALLED TO, READ BACK BY AND VERIFIED WITHMelodye Ped [facility] 4970 12/25/20 A BROWNING    Enterobacter cloacae complex NOT DETECTED NOT DETECTED Final   Escherichia coli DETECTED (A) NOT DETECTED Final  Comment: CRITICAL RESULT CALLED TO, READ BACK BY AND VERIFIED WITH: Melodye Ped PH[facility] 9211 12/25/20 A BROWNING    Klebsiella aerogenes NOT DETECTED NOT DETECTED Final   Klebsiella oxytoca NOT DETECTED NOT DETECTED Final   Klebsiella pneumoniae NOT DETECTED NOT DETECTED Final   Proteus species NOT DETECTED NOT DETECTED Final   Salmonella species NOT DETECTED NOT DETECTED Final   Serratia marcescens NOT DETECTED NOT DETECTED Final   Haemophilus influenzae NOT DETECTED NOT DETECTED Final    Neisseria meningitidis NOT DETECTED NOT DETECTED Final   Pseudomonas aeruginosa NOT DETECTED NOT DETECTED Final   Stenotrophomonas maltophilia NOT DETECTED NOT DETECTED Final   Candida albicans NOT DETECTED NOT DETECTED Final   Candida auris NOT DETECTED NOT DETECTED Final   Candida glabrata NOT DETECTED NOT DETECTED Final   Candida krusei NOT DETECTED NOT DETECTED Final   Candida parapsilosis NOT DETECTED NOT DETECTED Final   Candida tropicalis NOT DETECTED NOT DETECTED Final   Cryptococcus neoformans/gattii NOT DETECTED NOT DETECTED Final   CTX-M ESBL NOT DETECTED NOT DETECTED Final   Carbapenem resistance IMP NOT DETECTED NOT DETECTED Final   Carbapenem resistance KPC NOT DETECTED NOT DETECTED Final   Carbapenem resistance NDM NOT DETECTED NOT DETECTED Final   Carbapenem resist OXA 48 LIKE NOT DETECTED NOT DETECTED Final   Carbapenem resistance VIM NOT DETECTED NOT DETECTED Final    Comment: Performed at [facility], [address], [place], [place] [address]  Urine culture     Status: Abnormal   Collection Time: 12/24/20  9:36 PM   Specimen: In/Out Cath Urine  Result Value Ref Range Status   Specimen Description   Final    IN/OUT CATH URINE Performed at [facility], [address], [place], [place] [address]    Special Requests   Final    NONE Performed at [facility], [address] [place], [place] [address]    Culture 50,000 COLONIES/mL ESCHERICHIA COLI (A)  Final   Report Status 12/27/2020 FINAL  Final   Organism ID, Bacteria ESCHERICHIA COLI (A)  Final      Susceptibility   Escherichia coli - MIC*    AMPICILLIN 8 SENSITIVE Sensitive     CEFAZOLIN <=4 SENSITIVE Sensitive     CEFEPIME <=0.12 SENSITIVE Sensitive     CEFTRIAXONE <=0.25 SENSITIVE Sensitive     CIPROFLOXACIN <=0.25 SENSITIVE Sensitive     GENTAMICIN <=1 SENSITIVE Sensitive     IMIPENEM <=0.25 SENSITIVE Sensitive     NITROFURANTOIN <=16 SENSITIVE Sensitive     TRIMETH/SULFA <=20 SENSITIVE Sensitive      AMPICILLIN/SULBACTAM 4 SENSITIVE Sensitive     PIP/TAZO <=4 SENSITIVE Sensitive     * 50,000 COLONIES/mL ESCHERICHIA COLI  MRSA PCR Screening     Status: None   Collection Time: 12/25/20  2:17 AM   Specimen: Nasopharyngeal  Result Value Ref Range Status   MRSA by PCR NEGATIVE NEGATIVE Final    Comment:        The GeneXpert MRSA Assay (FDA approved for NASAL specimens only), is one component of a comprehensive MRSA colonization surveillance program. It is not intended to diagnose MRSA infection nor to guide or monitor treatment for MRSA infections. Performed at [facility], [place] [address] [place], [place] [address]   Culture, blood (routine x 2)     Status: None (Preliminary result)   Collection Time: 12/25/20 12:18 PM   Specimen: BLOOD RIGHT HAND  Result Value Ref Range Status   Specimen Description  Final    BLOOD RIGHT HAND Performed at [facility], [place] [address], [place], [place] [address]    Special Requests   Final    BOTTLES DRAWN AEROBIC AND ANAEROBIC Blood Culture adequate volume Performed at [place] [address], [place], [place] [address]    Culture   Final    NO GROWTH 4 DAYS Performed at [facility], [place] [address], [place], [place] [address]    Report Status PENDING  Incomplete  Culture, blood (routine x 2)     Status: None (Preliminary result)   Collection Time: 12/25/20 12:18 PM   Specimen: BLOOD LEFT HAND  Result Value Ref Range Status   Specimen Description   Final    BLOOD LEFT HAND Performed at [facility] [address] [place], [place] [address]    Special Requests   Final    BOTTLES DRAWN AEROBIC AND ANAEROBIC Blood Culture adequate volume Performed at [place] [address] [place], [place] [address]    Culture   Final    NO GROWTH 4 DAYS Performed at [facility], [place] [address] [place], [place] [address]     Report Status PENDING  Incomplete    RN Pressure Injury Documentation:     Estimated body mass index is 38.9 kg/m as calculated from the following:   Height as of this encounter: 5\' 3"  (1.6 m).   Weight as of this encounter: 99.6 kg.  Malnutrition Type:   Malnutrition Characteristics:   Nutrition Interventions:   Radiology Studies: No results found.  Scheduled Meds: . apixaban  2.5 mg Oral BID  . atorvastatin  20 mg Oral Daily  . cephALEXin  500 mg Oral Q8H  . chlorhexidine  15 mL Mouth Rinse BID  . Chlorhexidine Gluconate Cloth  6 each Topical Daily  . diltiazem  120 mg Oral Q12H  . ferrous sulfate  325 mg Oral BID  . insulin aspart  0-15 Units Subcutaneous TID WC  . insulin aspart  0-5 Units Subcutaneous QHS  . levothyroxine  50 mcg Oral QAC breakfast  . loratadine  10 mg Oral Daily  . mouth rinse  15 mL Mouth Rinse q12n4p  . oxybutynin  10 mg Oral QHS   Continuous Infusions: . sodium chloride Stopped (12/25/20 1542)  . lactated ringers Stopped (12/27/20 1510)     LOS: 4 days   Kerney Elbe, DO [facility] PAGER is on [place]  If 7PM-7AM, please contact night-coverage [email or web address]

## 2020-12-29 NOTE — Care Management Important Message (Signed)
Important Message  Patient Details IM Letter given to the Patient. Name: [name] [name] MRN: [id number] Date of Birth: May 01, 1941   Medicare Important Message Given:  Yes     Kerin Salen 12/29/2020, 12:28 PM

## 2020-12-29 NOTE — TOC Progression Note (Signed)
Transition of Care [facility]) - Progression Note    Patient Details  Name: [name] [name] MRN: [id number] Date of Birth: Mar 10, 1941  [facility] CM/SW Contact  Purcell Mouton, RN Phone Number: 12/29/2020, 12:38 PM  Clinical Narrative:    Spoke with pt concerning [place]. Pt selected [place]. Referral given to in house rep. Waiting on response.    Expected Discharge Plan: [place] Barriers to Discharge: No Barriers Identified  Expected Discharge Plan and Services Expected Discharge Plan: [place]   Discharge Planning Services: CM Consult Post Acute Care Choice: [place] arrangements for the past 2 months: Single Family Home                                       Social Determinants of Health (SDOH) Interventions    Readmission Risk Interventions No flowsheet data found.

## 2020-12-30 ENCOUNTER — Inpatient Hospital Stay (HOSPITAL_COMMUNITY): Payer: Medicare Other

## 2020-12-30 LAB — COMPREHENSIVE METABOLIC PANEL
ALT: 42 U/L (ref 0–44)
AST: 34 U/L (ref 15–41)
Albumin: 2.5 g/dL — ABNORMAL LOW (ref 3.5–5.0)
Alkaline Phosphatase: 552 U/L — ABNORMAL HIGH (ref 38–126)
Anion gap: 10 (ref 5–15)
BUN: 22 mg/dL (ref 8–23)
CO2: 25 mmol/L (ref 22–32)
Calcium: 9.9 mg/dL (ref 8.9–10.3)
Chloride: 103 mmol/L (ref 98–111)
Creatinine, Ser: 1.01 mg/dL — ABNORMAL HIGH (ref 0.44–1.00)
GFR, Estimated: 57 mL/min — ABNORMAL LOW (ref >60)
Glucose, Bld: 106 mg/dL — ABNORMAL HIGH (ref 70–99)
Potassium: 3.6 mmol/L (ref 3.5–5.1)
Sodium: 138 mmol/L (ref 135–145)
Total Bilirubin: 1.1 mg/dL (ref 0.3–1.2)
Total Protein: 5.5 g/dL — ABNORMAL LOW (ref 6.5–8.1)

## 2020-12-30 LAB — CBC WITH DIFFERENTIAL/PLATELET
Abs Immature Granulocytes: 0.27 10*3/uL — ABNORMAL HIGH (ref 0.00–0.07)
Basophils Absolute: 0.1 10*3/uL (ref 0.0–0.1)
Basophils Relative: 0 %
Eosinophils Absolute: 0.2 10*3/uL (ref 0.0–0.5)
Eosinophils Relative: 1 %
HCT: 33.7 % — ABNORMAL LOW (ref 36.0–46.0)
Hemoglobin: 11.1 g/dL — ABNORMAL LOW (ref 12.0–15.0)
Immature Granulocytes: 2 %
Lymphocytes Relative: 11 %
Lymphs Abs: 1.5 10*3/uL (ref 0.7–4.0)
MCH: 30.9 pg (ref 26.0–34.0)
MCHC: 32.9 g/dL (ref 30.0–36.0)
MCV: 93.9 fL (ref 80.0–100.0)
Monocytes Absolute: 1.7 10*3/uL — ABNORMAL HIGH (ref 0.1–1.0)
Monocytes Relative: 12 %
Neutro Abs: 9.8 10*3/uL — ABNORMAL HIGH (ref 1.7–7.7)
Neutrophils Relative %: 74 %
Platelets: 142 10*3/uL — ABNORMAL LOW (ref 150–400)
RBC: 3.59 MIL/uL — ABNORMAL LOW (ref 3.87–5.11)
RDW: 14.5 % (ref 11.5–15.5)
WBC: 13.4 10*3/uL — ABNORMAL HIGH (ref 4.0–10.5)
nRBC: 0 % (ref 0.0–0.2)

## 2020-12-30 LAB — CULTURE, BLOOD (ROUTINE X 2)
Culture: NO GROWTH
Culture: NO GROWTH
Special Requests: ADEQUATE
Special Requests: ADEQUATE

## 2020-12-30 LAB — IRON AND TIBC
Iron: 23 ug/dL — ABNORMAL LOW (ref 28–170)
Saturation Ratios: 8 % — ABNORMAL LOW (ref 10.4–31.8)
TIBC: 282 ug/dL (ref 250–450)
UIBC: 259 ug/dL

## 2020-12-30 LAB — GLUCOSE, CAPILLARY
Glucose-Capillary: 85 mg/dL (ref 70–99)
Glucose-Capillary: 99 mg/dL (ref 70–99)
Glucose-Capillary: 109 mg/dL — ABNORMAL HIGH (ref 70–99)

## 2020-12-30 LAB — FERRITIN: Ferritin: 149 ng/mL (ref 11–307)

## 2020-12-30 LAB — RETICULOCYTES
Immature Retic Fract: 31.7 % — ABNORMAL HIGH (ref 2.3–15.9)
RBC.: 3.52 MIL/uL — ABNORMAL LOW (ref 3.87–5.11)
Retic Count, Absolute: 34.1 10*3/uL (ref 19.0–186.0)
Retic Ct Pct: 1 % (ref 0.4–3.1)

## 2020-12-30 LAB — MAGNESIUM: Magnesium: 1.4 mg/dL — ABNORMAL LOW (ref 1.7–2.4)

## 2020-12-30 LAB — FOLATE: Folate: 17.2 ng/mL (ref >5.9)

## 2020-12-30 LAB — PHOSPHORUS: Phosphorus: 3 mg/dL (ref 2.5–4.6)

## 2020-12-30 LAB — VITAMIN B12: Vitamin B-12: 763 pg/mL (ref 180–914)

## 2020-12-30 MED ORDER — POTASSIUM CHLORIDE 20 MEQ PO PACK
40.0000 meq | PACK | Freq: Once | ORAL | Status: AC
  Administered 2020-12-30: 40 meq via ORAL
  Filled 2020-12-30: qty 2

## 2020-12-30 MED ORDER — POTASSIUM CHLORIDE CRYS ER 20 MEQ PO TBCR: 40.0000 meq | EXTENDED_RELEASE_TABLET | Freq: Once | ORAL | Status: DC

## 2020-12-30 MED ORDER — HYDROCORTISONE 0.5 % EX CREA
TOPICAL_CREAM | Freq: Two times a day (BID) | CUTANEOUS | 0 refills | Status: DC
Start: 2020-12-30 — End: 2021-02-07

## 2020-12-30 MED ORDER — ATORVASTATIN CALCIUM 20 MG PO TABS: 20.0000 mg | ORAL_TABLET | Freq: Every day | ORAL | 2 refills | Status: DC

## 2020-12-30 MED ORDER — CEPHALEXIN 500 MG PO CAPS: 500.0000 mg | ORAL_CAPSULE | Freq: Three times a day (TID) | ORAL | 0 refills | Status: AC

## 2020-12-30 MED ORDER — HYDROCORTISONE 0.5 % EX CREA
TOPICAL_CREAM | Freq: Two times a day (BID) | CUTANEOUS | Status: DC
  Filled 2020-12-30: qty 28.35

## 2020-12-30 MED ORDER — MAGNESIUM SULFATE 2 GM/50ML IV SOLN
2.0000 g | Freq: Once | INTRAVENOUS | Status: AC
  Administered 2020-12-30: 2 g via INTRAVENOUS
  Filled 2020-12-30: qty 50

## 2020-12-30 MED ORDER — DILTIAZEM HCL ER COATED BEADS 240 MG PO CP24: 240.0000 mg | ORAL_CAPSULE | Freq: Every day | ORAL | 2 refills | Status: DC

## 2020-12-30 MED ORDER — DIPHENHYDRAMINE HCL 25 MG PO CAPS: 25.0000 mg | ORAL_CAPSULE | Freq: Three times a day (TID) | ORAL | Status: DC | PRN

## 2020-12-30 NOTE — TOC Transition Note (Addendum)
Transition of Care [facility]) - CM/SW Discharge Note   Patient Details  Name: [name] [name] MRN: [id number] Date of Birth: [birth year]  Transition [facility]) CM/SW Contact:  Ross Ludwig, LCSW Phone Number: 12/30/2020, 5:20 PM   Clinical Narrative:     Patient will be going home with home health through [facility] and will need oxygen through [place].  CSW signing off please reconsult with any other social work needs, home health agency has been notified of planned discharge.  [facility] can deliver oxygen to the room before patient leaves.  CSW spoke to [place] at [place], and he said oxygen should be here within a couple of hours.  Final next level of care: [place] Barriers to Discharge: Barriers Resolved   Patient Goals and CMS Choice Patient states their goals for this hospitalization and ongoing recovery are:: To return back home. CMS Medicare.gov Compare Post Acute Care list provided to:: Patient Choice offered to / list presented to : Patient  Discharge Placement  Patient discharging home with home health and oxygen.                     Discharge Plan and Services   Discharge Planning Services: CM Consult Post Acute Care Choice: Home Health          DME Arranged: Oxygen DME Agency: [facility] Date DME Agency Contacted: 12/30/20 Time DME Agency Contacted: 0034 Representative spoke with at DME Agency: [place]: PT,OT [place] Agency: [place] ([place])        Social Determinants of Health (SDOH) Interventions     Readmission Risk Interventions No flowsheet data found.

## 2020-12-30 NOTE — TOC Progression Note (Signed)
Transition of Care [facility]) - Progression Note    Patient Details  Name: [name] [name] MRN: [id number] Date of Birth: [birth year]  Transition of Care [facility]) CM/SW Contact  Purcell Mouton, RN Phone Number: 12/30/2020, 12:55 PM  Clinical Narrative:    Pt will discharge home with [place].   Expected Discharge Plan: [place] Barriers to Discharge: No Barriers Identified  Expected Discharge Plan and Services Expected Discharge Plan: [place]   Discharge Planning Services: CM Consult Post Acute Care Choice: [place] arrangements for the past 2 months: Single Family Home                                       Social Determinants of Health (SDOH) Interventions    Readmission Risk Interventions No flowsheet data found.

## 2020-12-30 NOTE — Progress Notes (Signed)
Occupational Therapy Treatment Patient Details Name: [name] [name] MRN: [id number] DOB: 1940-09-01 Today's Date: 12/30/2020    History of present illness 80 yo female admitted with septic shock. S/P ureteral stent placement 6/3. Hx of OA, R THA-DA 2015, L THA 2012, obesity, DM, DVT, PE, gout, Afib.   OT comments  Treatment focused on self care tasks as patient expresses she is discharging home with family. Patient mod assist for LB ADLs and toileting today - demonstrating improved ability to assist. Patient able to transfer to edge of bed, stand and transfer to [facility] and recliner wth min guard and RW. Fatigued quickly however. Patient reports she is discharging home with family and has all needed DME and near 24/7 assistance. Recommend [place] OT at discharge as well.   Follow Up Recommendations  Home health OT    Equipment Recommendations  None recommended by OT    Recommendations for Other Services      Precautions / Restrictions Precautions Precautions: Fall Restrictions Weight Bearing Restrictions: No       Mobility Bed Mobility Overal bed mobility: Needs Assistance Bed Mobility: Supine to Sit     Supine to sit: Min guard;HOB elevated     General bed mobility comments: supervision to transfer to side of bed.    Transfers Overall transfer level: Needs assistance Equipment used: Rolling walker (2 wheeled) Transfers: Stand Pivot Transfers;Sit to/from Stand Sit to Stand: Min guard Stand pivot transfers: Min guard       General transfer comment: min guard for transfer to [facility] and then to recliner. Fatigued after toileting and unable to attempt standing at sink. Patient on 4 L [place].    Balance Overall balance assessment: Needs assistance Sitting-balance support: No upper extremity supported Sitting balance-Leahy Scale: Good     Standing balance support: Bilateral upper extremity supported Standing balance-Leahy Scale: Poor Standing balance comment: reliant on  walker                           ADL either performed or assessed with clinical judgement   ADL Overall ADL's : Needs assistance/impaired     Grooming: Wash/dry hands;Sitting Grooming Details (indicate cue type and reason): in recliner, after toileting             Lower Body Dressing: Moderate assistance;Sitting/lateral leans Lower Body Dressing Details (indicate cue type and reason): to don socks Toilet Transfer: Min Lobbyist Details (indicate cue type and reason): increased time to power up for toilet transfer, min guard Toileting- Clothing Manipulation and Hygiene: Moderate assistance Toileting - Clothing Manipulation Details (indicate cue type and reason): able to assist with managing hospital gown, able to assist with pericare but needed assistance for perianal cleaning             Vision Patient Visual Report: No change from baseline     Perception     Praxis      Cognition Arousal/Alertness: Awake/alert Behavior During Therapy: WFL for tasks assessed/performed Overall Cognitive Status: Within Functional Limits for tasks assessed                                          Exercises     Shoulder Instructions       General Comments      Pertinent Vitals/ Pain       Pain Assessment: No/denies pain  Home Living                                          Prior Functioning/Environment              Frequency  Min 2X/week        Progress Toward Goals  OT Goals(current goals can now be found in the care plan section)  Progress towards OT goals: Progressing toward goals  Acute Rehab OT Goals Patient Stated Goal: to get stronger to go home OT Goal Formulation: With patient Time For Goal Achievement: 01/10/21 Potential to Achieve Goals: Good  Plan Discharge plan needs to be updated    Co-evaluation                 AM-PAC OT "6 Clicks" Daily Activity     Outcome  Measure   Help from another person eating meals?: None Help from another person taking care of personal grooming?: A Little Help from another person toileting, which includes using toliet, bedpan, or urinal?: A Lot Help from another person bathing (including washing, rinsing, drying)?: A Lot Help from another person to put on and taking off regular upper body clothing?: A Little Help from another person to put on and taking off regular lower body clothing?: A Lot 6 Click Score: 16    End of Session Equipment Utilized During Treatment: Rolling walker  OT Visit Diagnosis: Muscle weakness (generalized) (M62.81);Other abnormalities of gait and mobility (R26.89);Pain   Activity Tolerance Patient limited by fatigue   Patient Left in chair;with call bell/phone within reach   Nurse Communication Mobility status (patient reports itching and back is red)        Time: 1638-4536 OT Time Calculation (min): 21 min  Charges: OT General Charges $OT Visit: 1 Visit OT Treatments $Self Care/Home Management : 8-22 mins  Derl Barrow, OTR/L [place]  Office [phone] Pager: [place] 12/30/2020, 11:00 AM

## 2020-12-30 NOTE — Discharge Summary (Signed)
[facility]  Physician Discharge Summary   Patient ID: [name] [name] MRN: [id number] DOB/AGE: 1941/06/26 80 y.o.  Admit date: 12/24/2020 Discharge date:   12/30/2020   PCP: Loman [place], FNP  DISCHARGE DIAGNOSES:  Septic shock secondary to urinary tract infection, resolved Right proximal ureteral stone with right hydroureteronephrosis status post ureteral stent Acute kidney injury, improved Normocytic anemia Atrial fibrillation Acute respiratory failure with hypoxia History of venous thromboembolism on anticoagulation Essential hypertension Diabetes mellitus type 2 Hyperlipidemia Hypothyroidism   RECOMMENDATIONS FOR OUTPATIENT FOLLOW UP: 1. Follow-up with urology 2. Home health and home oxygen has been ordered    Home Health: PT and OT Equipment/Devices: Home oxygen  CODE STATUS: Full code  DISCHARGE CONDITION: fair  Diet recommendation: Modified carbohydrate  INITIAL HISTORY: Patient is a 80 year old Caucasian obese female with past medical history significant for but not limited to hypertension, gout, history of an unprovoked PE on anticoagulation, diabetes mellitus type 2 not on insulin as well as other comorbidities including proximal afibrillation who presented to [facility], ED with right-sided flank pain and decreased oral intake secondary to urosepsis with ureteral lateral stone and hydronephrosis on CT abdomen pelvis.  She subsequently developed A. fib with RVR became hypotensive and started on Cardizem and then improved with IV amiodarone.  She was found to be in septic shock and subsequently underwent right ureteral stenting and amiodarone was discontinued on 12/27/2020.  On 12/28/2020 she was transferred to [facility] service.    Consultations:  Urology  Critical care medicine  Infectious disease  Procedures: 1. Cystoscopy 2.Rightretrograde pyelogram with interpretation 3.Rightureteral stent placement   HOSPITAL COURSE:   Septic shock  secondary to urinary tract infection in the setting of Right Proximal Ureteral Stone Causing Right Hydrouretonerphrosis, present on admission  -She presented with tachypnea, hypotensive, tachycardia, leukocytosis and lactic acidosis with concern for infection given her renal stone and concern for septic shock -She ended up on pressors for short-term and then subsequently weaned off -Blood cultures x2 showed E. coli present -Underwent ureteral stenting by urology and cultures growing E. Coli Patient was initially placed on ceftriaxone.  Changed over to cephalexin by infectious disease.  Multifactorial AKI, improving  None anion gap metabolic acidosis, improved  -This was in the setting of hypovolemia, obstruction and infection -Suspect from some azotemia but may have some nongap RTA Renal function and metabolic acidosis both have improved.  Normocytic Anemia Patient experienced her dilutional drop in hemoglobin.  Stable for the most part.  No overt bleeding.  Hypokalemia Corrected.  A Fib with RVR Elevated Troponin -Driven by sepsis and demand ischemia.  Placed on amiodarone.  Subsequently transitioned to diltiazem.  Already on anticoagulation. -Follows up with Dr. Percival Spanish in outpatient setting  Acute Respiratory Failure with Hypoxia Chest x-ray did not show any acute findings except for atelectasis.  Ambulatory pulse oximetry shows that saturation dropped to 87% on room air.  She will be discharged with home oxygen.    Unprovoked VTE -Continue with Eliquis 2.5 mg p.o. twice daily  Essential hypertension Continue with diltiazem.  We will hold her amlodipine and ARB.  Diabetes Mellitus Type 2 Continue home medications  HLD Simvastatin changed over to atorvastatin due to initiation of diltiazem.  Gout Stable  Hypothyroidism -C/w Home Euthyrox  Obesity Estimated body mass index is 39.17 kg/m as calculated from the following:   Height as of this encounter: 5\' 3"   (1.6 m).   Weight as of this encounter: 100.3 kg.  Patient is stable.  Okay for discharge home today with home oxygen.   PERTINENT LABS:  The results of significant diagnostics from this hospitalization (including imaging, microbiology, ancillary and laboratory) are listed below for reference.    Microbiology: Recent Results (from the past 240 hour(s))  Resp Panel by RT-PCR (Flu A&B, Covid) Nasopharyngeal Swab     Status: None   Collection Time: 12/24/20  4:42 PM   Specimen: Nasopharyngeal Swab; Nasopharyngeal(NP) swabs in vial transport medium  Result Value Ref Range Status   SARS Coronavirus 2 by RT PCR NEGATIVE NEGATIVE Final    Comment: (NOTE) SARS-CoV-2 target nucleic acids are NOT DETECTED.  The SARS-CoV-2 RNA is generally detectable in upper respiratory specimens during the acute phase of infection. The lowest concentration of SARS-CoV-2 viral copies this assay can detect is 138 copies/mL. A negative result does not preclude SARS-Cov-2 infection and should not be used as the sole basis for treatment or other patient management decisions. A negative result may occur with  improper specimen collection/handling, submission of specimen other than nasopharyngeal swab, presence of viral mutation(s) within the areas targeted by this assay, and inadequate number of viral copies(<138 copies/mL). A negative result must be combined with clinical observations, patient history, and epidemiological information. The expected result is Negative.  Fact Sheet for Patients:  EntrepreneurPulse.com.au  Fact Sheet for Healthcare Providers:  IncredibleEmployment.be  This test is no t yet approved or cleared by the [place] FDA and  has been authorized for detection and/or diagnosis of SARS-CoV-2 by FDA under an Emergency Use Authorization (EUA). This EUA will remain  in effect (meaning this test can be used) for the duration of the COVID-19  declaration under Section 564(b)(1) of the Act, 21 U.S.C.section 360bbb-3(b)(1), unless the authorization is terminated  or revoked sooner.       Influenza A by PCR NEGATIVE NEGATIVE Final   Influenza B by PCR NEGATIVE NEGATIVE Final    Comment: (NOTE) The Xpert Xpress SARS-CoV-2/FLU/RSV plus assay is intended as an aid in the diagnosis of influenza from Nasopharyngeal swab specimens and should not be used as a sole basis for treatment. Nasal washings and aspirates are unacceptable for Xpert Xpress SARS-CoV-2/FLU/RSV testing.  Fact Sheet for Patients: EntrepreneurPulse.com.au  Fact Sheet for Healthcare Providers: IncredibleEmployment.be  This test is not yet approved or cleared by the [place] FDA and has been authorized for detection and/or diagnosis of SARS-CoV-2 by FDA under an Emergency Use Authorization (EUA). This EUA will remain in effect (meaning this test can be used) for the duration of the COVID-19 declaration under Section 564(b)(1) of the Act, 21 U.S.C. section 360bbb-3(b)(1), unless the authorization is terminated or revoked.  Performed at [facility], [address], [place], [place] [address]   Blood culture (routine single)     Status: Abnormal   Collection Time: 12/24/20  6:12 PM   Specimen: BLOOD  Result Value Ref Range Status   Specimen Description   Final    BLOOD BOTTLES DRAWN AEROBIC AND ANAEROBIC Blood Culture adequate volume RIGHT ANTECUBITAL Performed at [facility], [address], [place], [place] [address]    Special Requests   Final    NONE Performed at [facility], [address] [place],  [address]    Culture  Setup Time   Final    GRAM NEGATIVE RODS BOTH AEROBIC AND ANAEROBIC BOTTLES Gram Stain Report Called to,Read Back By and Verified With: A KOONTZ @ [facility] 12/25/20 @ 0849 BY S BEARD. CRITICAL RESULT CALLED TO, READ BACK  BY AND VERIFIED WITHMelodye Ped [facility] 3419 12/25/20 A  BROWNING Performed at [facility], [place] [address], [place], [place] [address]    Culture ESCHERICHIA COLI (A)  Final   Report Status 12/27/2020 FINAL  Final   Organism ID, Bacteria ESCHERICHIA COLI  Final      Susceptibility   Escherichia coli - MIC*    AMPICILLIN 8 SENSITIVE Sensitive     CEFAZOLIN <=4 SENSITIVE Sensitive     CEFEPIME <=0.12 SENSITIVE Sensitive     CEFTAZIDIME <=1 SENSITIVE Sensitive     CEFTRIAXONE <=0.25 SENSITIVE Sensitive     CIPROFLOXACIN <=0.25 SENSITIVE Sensitive     GENTAMICIN <=1 SENSITIVE Sensitive     IMIPENEM 0.5 SENSITIVE Sensitive     TRIMETH/SULFA <=20 SENSITIVE Sensitive     AMPICILLIN/SULBACTAM 4 SENSITIVE Sensitive     PIP/TAZO <=4 SENSITIVE Sensitive     * ESCHERICHIA COLI  Blood Culture ID Panel (Reflexed)     Status: Abnormal   Collection Time: 12/24/20  6:12 PM  Result Value Ref Range Status   Enterococcus faecalis NOT DETECTED NOT DETECTED Final   Enterococcus Faecium NOT DETECTED NOT DETECTED Final   Listeria monocytogenes NOT DETECTED NOT DETECTED Final   Staphylococcus species NOT DETECTED NOT DETECTED Final   Staphylococcus aureus (BCID) NOT DETECTED NOT DETECTED Final   Staphylococcus epidermidis NOT DETECTED NOT DETECTED Final   Staphylococcus lugdunensis NOT DETECTED NOT DETECTED Final   Streptococcus species NOT DETECTED NOT DETECTED Final   Streptococcus agalactiae NOT DETECTED NOT DETECTED Final   Streptococcus pneumoniae NOT DETECTED NOT DETECTED Final   Streptococcus pyogenes NOT DETECTED NOT DETECTED Final   A.calcoaceticus-baumannii NOT DETECTED NOT DETECTED Final   Bacteroides fragilis NOT DETECTED NOT DETECTED Final   Enterobacterales DETECTED (A) NOT DETECTED Final    Comment: Enterobacterales represent a large order of gram negative bacteria, not a single organism. CRITICAL RESULT CALLED TO, READ BACK BY AND VERIFIED WITH: Melodye Ped PHARMD [id number] 12/25/20 A BROWNING    Enterobacter cloacae complex NOT DETECTED NOT  DETECTED Final   Escherichia coli DETECTED (A) NOT DETECTED Final    Comment: CRITICAL RESULT CALLED TO, READ BACK BY AND VERIFIED WITH: Melodye Ped PHARMD 3532 12/25/20 A BROWNING    Klebsiella aerogenes NOT DETECTED NOT DETECTED Final   Klebsiella oxytoca NOT DETECTED NOT DETECTED Final   Klebsiella pneumoniae NOT DETECTED NOT DETECTED Final   Proteus species NOT DETECTED NOT DETECTED Final   Salmonella species NOT DETECTED NOT DETECTED Final   Serratia marcescens NOT DETECTED NOT DETECTED Final   Haemophilus influenzae NOT DETECTED NOT DETECTED Final   Neisseria meningitidis NOT DETECTED NOT DETECTED Final   Pseudomonas aeruginosa NOT DETECTED NOT DETECTED Final   Stenotrophomonas maltophilia NOT DETECTED NOT DETECTED Final   Candida albicans NOT DETECTED NOT DETECTED Final   Candida auris NOT DETECTED NOT DETECTED Final   Candida glabrata NOT DETECTED NOT DETECTED Final   Candida krusei NOT DETECTED NOT DETECTED Final   Candida parapsilosis NOT DETECTED NOT DETECTED Final   Candida tropicalis NOT DETECTED NOT DETECTED Final   Cryptococcus neoformans/gattii NOT DETECTED NOT DETECTED Final   CTX-M ESBL NOT DETECTED NOT DETECTED Final   Carbapenem resistance IMP NOT DETECTED NOT DETECTED Final   Carbapenem resistance KPC NOT DETECTED NOT DETECTED Final   Carbapenem resistance NDM NOT DETECTED NOT DETECTED Final   Carbapenem resist OXA 48 LIKE NOT DETECTED NOT DETECTED Final   Carbapenem resistance VIM NOT DETECTED NOT DETECTED Final  Comment: Performed at [facility], [place] [address], [place], [place] [address]  Urine culture     Status: Abnormal   Collection Time: 12/24/20  9:36 PM   Specimen: In/Out Cath Urine  Result Value Ref Range Status   Specimen Description   Final    IN/OUT CATH URINE Performed at [facility], [address], [place], [place] [address]    Special Requests   Final    NONE Performed at [facility], [address] [place], [place] [address]     Culture 50,000 COLONIES/mL ESCHERICHIA COLI (A)  Final   Report Status 12/27/2020 FINAL  Final   Organism ID, Bacteria ESCHERICHIA COLI (A)  Final      Susceptibility   Escherichia coli - MIC*    AMPICILLIN 8 SENSITIVE Sensitive     CEFAZOLIN <=4 SENSITIVE Sensitive     CEFEPIME <=0.12 SENSITIVE Sensitive     CEFTRIAXONE <=0.25 SENSITIVE Sensitive     CIPROFLOXACIN <=0.25 SENSITIVE Sensitive     GENTAMICIN <=1 SENSITIVE Sensitive     IMIPENEM <=0.25 SENSITIVE Sensitive     NITROFURANTOIN <=16 SENSITIVE Sensitive     TRIMETH/SULFA <=20 SENSITIVE Sensitive     AMPICILLIN/SULBACTAM 4 SENSITIVE Sensitive     PIP/TAZO <=4 SENSITIVE Sensitive     * 50,000 COLONIES/mL ESCHERICHIA COLI  MRSA PCR Screening     Status: None   Collection Time: 12/25/20  2:17 AM   Specimen: Nasopharyngeal  Result Value Ref Range Status   MRSA by PCR NEGATIVE NEGATIVE Final    Comment:        The GeneXpert MRSA Assay (FDA approved for NASAL specimens only), is one component of a comprehensive MRSA colonization surveillance program. It is not intended to diagnose MRSA infection nor to guide or monitor treatment for MRSA infections. Performed at [facility], [place] [address], [place], [place] [address]   Culture, blood (routine x 2)     Status: None   Collection Time: 12/25/20 12:18 PM   Specimen: BLOOD RIGHT HAND  Result Value Ref Range Status   Specimen Description   Final    BLOOD RIGHT HAND Performed at [facility] [address], [place], [place] [address]    Special Requests   Final    BOTTLES DRAWN AEROBIC AND ANAEROBIC Blood Culture adequate volume Performed at [facility] [address], [place], [place] [address]    Culture   Final    NO GROWTH 5 DAYS Performed at [facility], [place] [address] [place], [place] [address]    Report Status 12/30/2020 FINAL  Final  Culture, blood (routine x 2)     Status: None   Collection  Time: 12/25/20 12:18 PM   Specimen: BLOOD LEFT HAND  Result Value Ref Range Status   Specimen Description   Final    BLOOD LEFT HAND Performed at [place] [address], [place], [place] [address]    Special Requests   Final    BOTTLES DRAWN AEROBIC AND ANAEROBIC Blood Culture adequate volume Performed at [facility] [address], [place], [place] [address]    Culture   Final    NO GROWTH 5 DAYS Performed at [facility], [place] [address], [place], [place] [address]    Report Status 12/30/2020 FINAL  Final     Labs:  [id number] Labs  Recent Labs    12/30/20 0653  FERRITIN 149  Lab Results  Component Value Date   [place] NEGATIVE 12/24/2020   [place] NEGATIVE 12/06/2019   [place] NEGATIVE 04/03/2019      Basic Metabolic Panel: Recent Labs  Lab 12/25/20 0253 12/25/20 1556 12/25/20 2034 12/26/20 0500 12/27/20 0209 12/28/20 0255 12/29/20 0411 12/30/20 0653  NA 133* 134*   < > 134* 136 139 136 138  K 4.3 3.3*   < > 3.3* 3.2* 3.4* 3.9 3.6  CL 106 102   < > 99 101 105 103 103  CO2 14* 20*   < > 23 24 26 26 25   GLUCOSE 128* 109*   < > 96 107* 121* 116* 106*  BUN 65* 73*   < > 72* 66* 50* 32* 22  CREATININE 3.88* 4.19*   < > 3.58* 2.53* 1.84* 1.31* 1.01*  CALCIUM 8.7* 8.4*   < > 8.4* 9.0 9.5 9.4 9.9  MG 1.6* 2.1  --   --   --   --   --  1.4*  PHOS 4.1  --   --   --   --   --   --  3.0   < > = values in this interval not displayed.   Liver Function Tests: Recent Labs  Lab 12/24/20 1812 12/30/20 0653  AST 23 34  ALT 19 42  ALKPHOS 123 552*  BILITOT 1.0 1.1  PROT 6.4* 5.5*  ALBUMIN 3.4* 2.5*   Recent Labs  Lab 12/24/20 1812  LIPASE 15   CBC: Recent Labs  Lab 12/26/20 0500 12/27/20 0209 12/28/20 0255 12/29/20 0411 12/30/20 0653  WBC 13.3* 11.6* 12.9* 13.7* 13.4*  NEUTROABS  --   --   --   --  9.8*  HGB 10.3* 10.5* 11.3* 10.7* 11.1*  HCT 30.3* 30.9* 33.6* 31.8* 33.7*  MCV 90.4  90.9 92.3 91.9 93.9  PLT 45* 51* 66* 93* 142*   Cardiac Enzymes: No results for input(s): CKTOTAL, CKMB, CKMBINDEX, TROPONINI in the last 168 hours. BNP: BNP (last 3 results) No results for input(s): BNP in the last 8760 hours.  ProBNP (last 3 results) No results for input(s): PROBNP in the last 8760 hours.  CBG: Recent Labs  Lab 12/29/20 1120 12/29/20 1633 12/29/20 2158 12/30/20 0728 12/30/20 1144  GLUCAP 122* 115* 89 85 99     IMAGING STUDIES CT ABDOMEN PELVIS WO CONTRAST  Result Date: 12/24/2020 CLINICAL DATA:  Abdomen pain with fever EXAM: CT ABDOMEN AND PELVIS WITHOUT CONTRAST TECHNIQUE: Multidetector CT imaging of the abdomen and pelvis was performed following the standard protocol without IV contrast. COMPARISON:  CT 01/22/2020 FINDINGS: Lower chest: Lung bases demonstrate streaky atelectasis. Cardiomegaly. Hepatobiliary: Slightly distended gallbladder. No calcified stone. No focal hepatic abnormality or biliary dilatation. Pancreas: Unremarkable. No pancreatic ductal dilatation or surrounding inflammatory changes. Spleen: Normal in size without focal abnormality. Adrenals/Urinary Tract: Adrenal glands are normal. Multiple low-density lesions in the kidneys consistent with cysts. Moderate right perinephric fat stranding. Mild right hydronephrosis, secondary to a 7 x 10 mm stone in the proximal right ureter just past the UPJ. Bladder is unremarkable. There are bilateral intrarenal stones. Small cortical hyperdensity within the lower pole left kidney too small to further characterize. Stomach/Bowel: The stomach is nonenlarged. No dilated small bowel. Negative appendix. Diverticular disease of the sigmoid colon. Vascular/Lymphatic: Moderate aortic atherosclerosis. No aneurysm. No suspicious nodes. Reproductive: Status post hysterectomy. No adnexal masses. Other: Negative for free air. Trace fluid within the bilateral colic gutters. Right retroperitoneal edema and small fluid presumably  due to  obstructive kidney disease. Musculoskeletal: Bilateral hip replacements. Grade 1 anterolisthesis L3 on L4 and L4 on L5. Diffuse degenerative changes. No acute osseous abnormality IMPRESSION: 1. Mild right hydronephrosis, secondary to a 7 x 10 mm stone in the proximal right ureter just distal to the UPJ. 2. There are intrarenal stones bilaterally 3. There is gallbladder distension without inflammatory change or calcified stone. Electronically Signed   By: Donavan Foil M.D.   On: 12/24/2020 19:49   DG Chest 2 View  Result Date: 12/24/2020 CLINICAL DATA:  Shortness of breath, chest pain EXAM: CHEST - 2 VIEW COMPARISON:  04/03/2019 FINDINGS: Cardiomegaly. Aortic atherosclerosis. No confluent opacities or effusions. No acute bony abnormality. IMPRESSION: Mild cardiomegaly.  No active disease. Electronically Signed   By: Rolm Baptise M.D.   On: 12/24/2020 17:10   DG Abd 1 View  Result Date: 12/25/2020 CLINICAL DATA:  Enteric catheter placement EXAM: ABDOMEN - 1 VIEW COMPARISON:  12/24/2020 FINDINGS: Frontal view of the lower chest and upper abdomen demonstrates enteric catheter passing below diaphragm, tip projecting over the gastric body. Paucity of bowel gas. Stable enlarged cardiac silhouette. IMPRESSION: 1. Enteric catheter tip projecting over the gastric body. Electronically Signed   By: Randa Ngo M.D.   On: 12/25/2020 03:09   US RENAL  Result Date: 12/25/2020 CLINICAL DATA:  Acute renal failure EXAM: RENAL / URINARY TRACT ULTRASOUND COMPLETE COMPARISON:  CT 12/24/2020, sonogram 01/08/2020 FINDINGS: Right Kidney: Renal measurements: 11.3 x 6.7 x 5.6 cm = volume: 219 mL. Renal cortical thickness is preserved and cortical echogenicity is within normal limits. Previously noted hydronephrosis has resolved. No intrarenal masses or calcifications are seen. Multiple simple cortical cysts are identified measuring up to 18 mm with interpolar region. Left Kidney: Renal measurements: 11.1 x 5.4 x 5.1 cm =  volume: 161 mL. Renal cortical thickness is preserved and cortical echogenicity is within normal limits. No intrarenal masses or calcifications are seen. Multiple simple cortical cysts are identified measuring up to 6.7 cm in greatest dimension within the lower pole. No hydronephrosis. Bladder: The bladder is decompressed with a Foley catheter balloon seen within its lumen. Other: None. IMPRESSION: Interval resolution of previously noted hydronephrosis. Multiple simple cortical cysts bilaterally, benign. Otherwise unremarkable renal sonogram. Electronically Signed   By: Fidela Salisbury MD   On: 12/25/2020 22:00   DG CHEST PORT 1 VIEW  Result Date: 12/30/2020 CLINICAL DATA:  Back pain.  History of septic shock. EXAM: PORTABLE CHEST 1 VIEW COMPARISON:  12/24/2020. FINDINGS: Stable cardiomegaly. No pulmonary venous congestion. Low lung volumes with persistent bibasilar atelectasis. No pleural effusion or pneumothorax. Degenerative change thoracic spine. No acute bony abnormality. IMPRESSION: 1.  Stable cardiomegaly.  No pulmonary venous congestion. 2.  Low lung volumes with persistent bibasilar atelectasis. Electronically Signed   By: Marcello Moores  Register   On: 12/30/2020 05:57   DG Chest Port 1 View  Result Date: 12/24/2020 CLINICAL DATA:  Shortness of breath and chest pain. EXAM: PORTABLE CHEST 1 VIEW COMPARISON:  Radiograph earlier today. Lung bases from abdominal CT earlier today. FINDINGS: Lung volumes are low. Similar cardiomegaly increasing bibasilar atelectasis. No pulmonary edema, large pleural effusion, or pneumothorax. Stable osseous structures. IMPRESSION: Low lung volumes with increasing bibasilar atelectasis from earlier today. Electronically Signed   By: Keith Rake M.D.   On: 12/24/2020 20:57   DG C-Arm 1-60 Min-No Report  Result Date: 12/25/2020 Fluoroscopy was utilized by the requesting physician.  No radiographic interpretation.    DISCHARGE EXAMINATION: Vitals:   12/30/20 0500  12/30/20  0526 12/30/20 1148 12/30/20 1436  BP:  (!) 132/91 130/85 125/62  Pulse:  92 84 61  Resp:  (!) 22 20   Temp:  98.7 F (37.1 C) 98.6 F (37 C)   TempSrc:  Oral Oral   SpO2:  100% 100%   Weight: 100.3 kg     Height:       General appearance: Awake alert.  In no distress Resp: Clear to auscultation bilaterally.  Normal effort Cardio: S1-S2 is normal regular.  No S3-S4.  No rubs murmurs or bruit GI: Abdomen is soft.  Nontender nondistended.  Bowel sounds are present normal.  No masses organomegaly Extremities: No edema.  Full range of motion of lower extremities. Neurologic: Alert and oriented x3.  No focal neurological deficits.    DISPOSITION: Home with home health  Discharge Instructions    Call MD for:  difficulty breathing, headache or visual disturbances   Complete by: As directed    Call MD for:  extreme fatigue   Complete by: As directed    Call MD for:  persistant dizziness or light-headedness   Complete by: As directed    Call MD for:  persistant nausea and vomiting   Complete by: As directed    Call MD for:  severe uncontrolled pain   Complete by: As directed    Call MD for:  temperature >100.4   Complete by: As directed    Diet - low sodium heart healthy   Complete by: As directed    Discharge instructions   Complete by: As directed    Please be sure to follow-up with the urologist as instructed.  Take your medications as prescribed.  You were cared for by a hospitalist during your hospital stay. If you have any questions about your discharge medications or the care you received while you were in the hospital after you are discharged, you can call the unit and asked to speak with the hospitalist on call if the hospitalist that took care of you is not available. Once you are discharged, your primary care physician will handle any further medical issues. Please note that NO REFILLS for any discharge medications will be authorized once you are discharged, as it is  imperative that you return to your primary care physician (or establish a relationship with a primary care physician if you do not have one) for your aftercare needs so that they can reassess your need for medications and monitor your lab values. If you do not have a primary care physician, you can call [phone] for a physician referral.   Increase activity slowly   Complete by: As directed    No wound care   Complete by: As directed          Allergies as of 12/30/2020   No Known Allergies     Medication List    STOP taking these medications   amLODipine 10 MG tablet Commonly known as: NORVASC   simvastatin 40 MG tablet Commonly known as: ZOCOR   valsartan 80 MG tablet Commonly known as: Diovan     TAKE these medications   acetaminophen 325 MG tablet Commonly known as: TYLENOL Take 2 tablets (650 mg total) by mouth every 6 (six) hours as needed for mild pain or headache (or Fever >/= 101).   allopurinol 100 MG tablet Commonly known as: ZYLOPRIM TAKE 1 TABLET BY MOUTH  DAILY   apixaban 2.5 MG Tabs tablet Commonly known as: ELIQUIS Take 1 tablet (2.5 mg  total) by mouth 2 (two) times daily.   atorvastatin 20 MG tablet Commonly known as: LIPITOR Take 1 tablet (20 mg total) by mouth daily. Start taking on: December 31, 2020   cephALEXin 500 MG capsule Commonly known as: KEFLEX Take 1 capsule (500 mg total) by mouth 3 (three) times daily for 3 days. Stop taking after 6/10. Resume the remaining medication the day before stone removal and continue for the day after until gone.   diclofenac Sodium 1 % Gel Commonly known as: VOLTAREN Apply 4 g topically 4 (four) times daily.   diltiazem 240 MG 24 hr capsule Commonly known as: Cartia XT Take 1 capsule (240 mg total) by mouth daily.   Euthyrox 50 MCG tablet Generic drug: levothyroxine TAKE 1 TABLET BY MOUTH ONCE DAILY BEFORE BREAKFAST   ferrous sulfate 325 (65 FE) MG tablet Take 325 mg by mouth 2 (two) times daily.    furosemide 20 MG tablet Commonly known as: LASIX TAKE 1 TABLET BY MOUTH  TWICE DAILY   hydrocortisone cream 0.5 % Apply topically 2 (two) times daily.   loratadine 10 MG tablet Commonly known as: CLARITIN Take 10 mg by mouth daily.   ONE TOUCH ULTRA TEST test strip Generic drug: glucose blood USE UP TO FOUR TIMES DAILY AS DIRECTED   oxybutynin 10 MG 24 hr tablet Commonly known as: DITROPAN-XL Take 1 tablet (10 mg total) by mouth at bedtime.   pantoprazole 40 MG tablet Commonly known as: PROTONIX Take 1 tablet (40 mg total) by mouth 2 (two) times daily before a meal.   Vitamin D-3 125 MCG (5000 UT) Tabs Take 5,000 Units by mouth daily.            Durable Medical Equipment  (From admission, onward)         Start     Ordered   12/30/20 1440  For home use only DME oxygen  Once       Question Answer Comment  Length of Need 6 Months   Mode or (Route) Nasal cannula   Liters per Minute 2   Frequency Continuous (stationary and portable oxygen unit needed)   Oxygen conserving device Yes   Oxygen delivery system Gas      12/30/20 1439            Follow-up Information    Schedule an appointment as soon as possible for a visit with [place].   Contact information: [place] [phone]       [facility] Follow [facility].   Specialty: Home Health Services Why: Telephone number for [facility] [phone], Will follow you at home for [facility] needs.        Loman Brooklyn, FNP. Schedule an appointment as soon as possible for a visit in 1 week(s).   Specialty: Family Medicine Contact information: [place] [address] [phone]               TOTAL DISCHARGE TIME: 35 minutes  [place]  [facility] Pager on [email or web address]  12/30/2020, 5:02 PM

## 2020-12-30 NOTE — Progress Notes (Signed)
SATURATION QUALIFICATIONS: (This note is used to comply with regulatory documentation for home oxygen)  Patient Saturations on Room Air at Rest = 87%  Patient Saturations on Room Air while Ambulating = 85%  Patient Saturations on 4 Liters of oxygen while Ambulating = 93 %  Please briefly explain why patient needs home oxygen: to properly oxygenate

## 2020-12-30 NOTE — Discharge Instructions (Signed)
   Activity:  You are encouraged to ambulate frequently (about every hour during waking hours) to help prevent blood clots from forming in your legs or lungs.     Diet: You should advance your diet as instructed by your physician.  It will be normal to have some bloating, nausea, and abdominal discomfort intermittently.   Prescriptions:  You will be provided a prescription for pain medication to take as needed.  If your pain is not severe enough to require the prescription pain medication, you may take extra strength Tylenol instead which will have less side effects.  You should also take a prescribed stool softener to avoid straining with bowel movements as the prescription pain medication may constipate you.   What to call us about: You should call the office [phone]) if you develop fever > 101 or develop persistent vomiting. Activity:  You are encouraged to ambulate frequently (about every hour during waking hours) to help prevent blood clots from forming in your legs or lungs.    You have a right ureteral stent in place.  This is temporary.  This will be removed/exchanged during definitive treatment of ureteral stone.

## 2020-12-31 ENCOUNTER — Ambulatory Visit (INDEPENDENT_AMBULATORY_CARE_PROVIDER_SITE_OTHER): Payer: Medicare Other | Admitting: *Deleted

## 2020-12-31 ENCOUNTER — Encounter: Payer: Self-pay | Admitting: *Deleted

## 2020-12-31 DIAGNOSIS — J9601 Acute respiratory failure with hypoxia: Secondary | ICD-10-CM | POA: Diagnosis not present

## 2020-12-31 DIAGNOSIS — R6521 Severe sepsis with septic shock: Secondary | ICD-10-CM

## 2020-12-31 DIAGNOSIS — I10 Essential (primary) hypertension: Secondary | ICD-10-CM

## 2020-12-31 DIAGNOSIS — E119 Type 2 diabetes mellitus without complications: Secondary | ICD-10-CM

## 2020-12-31 NOTE — Chronic Care Management (AMB) (Signed)
Chronic Care Management   CCM RN Visit Note  12/31/2020 Name: [name] [name] MRN: [id number] DOB: 12-20-1940  Subjective: [name] [name] is a 80 y.o. year old female who is a primary care patient of [name] Brooklyn, FNP. The care management team was consulted for assistance with disease management and care coordination needs.    Engaged with patient by telephone for follow up visit in response to provider referral for case management and/or care coordination services.   Consent to Services:  The patient was given information about Chronic Care Management services, agreed to services, and gave verbal consent prior to initiation of services.  Please see initial visit note for detailed documentation.   Patient agreed to services and verbal consent obtained.   Assessment: Review of patient past medical history, allergies, medications, health status, including review of consultants reports, laboratory and other test data, was performed as part of comprehensive evaluation and provision of chronic care management services.   SDOH (Social Determinants of Health) assessments and interventions performed:    CCM Care Plan  No Known Allergies  Outpatient Encounter Medications as of 12/31/2020  Medication Sig   acetaminophen (TYLENOL) 325 MG tablet Take 2 tablets (650 mg total) by mouth every 6 (six) hours as needed for mild pain or headache (or Fever >/= 101).   allopurinol (ZYLOPRIM) 100 MG tablet TAKE 1 TABLET BY MOUTH  DAILY   apixaban (ELIQUIS) 2.5 MG TABS tablet Take 1 tablet (2.5 mg total) by mouth 2 (two) times daily.   atorvastatin (LIPITOR) 20 MG tablet Take 1 tablet (20 mg total) by mouth daily.   cephALEXin (KEFLEX) 500 MG capsule Take 1 capsule (500 mg total) by mouth 3 (three) times daily for 3 days. Stop taking after 6/10. Resume the remaining medication the day before stone removal and continue for the day after until gone.   Cholecalciferol (VITAMIN D-3) 5000 UNITS TABS  Take 5,000 Units by mouth daily.   diclofenac Sodium (VOLTAREN) 1 % GEL Apply 4 g topically 4 (four) times daily.   diltiazem (CARTIA XT) 240 MG 24 hr capsule Take 1 capsule (240 mg total) by mouth daily.   EUTHYROX 50 MCG tablet TAKE 1 TABLET BY MOUTH ONCE DAILY BEFORE BREAKFAST   ferrous sulfate 325 (65 FE) MG tablet Take 325 mg by mouth 2 (two) times daily.   furosemide (LASIX) 20 MG tablet TAKE 1 TABLET BY MOUTH  TWICE DAILY   hydrocortisone cream 0.5 % Apply topically 2 (two) times daily.   loratadine (CLARITIN) 10 MG tablet Take 10 mg by mouth daily.   ONE TOUCH ULTRA TEST test strip USE UP TO FOUR TIMES DAILY AS DIRECTED   oxybutynin (DITROPAN-XL) 10 MG 24 hr tablet Take 1 tablet (10 mg total) by mouth at bedtime.   pantoprazole (PROTONIX) 40 MG tablet Take 1 tablet (40 mg total) by mouth 2 (two) times daily before a meal.   No facility-administered encounter medications on file as of 12/31/2020.    Patient Active Problem List   Diagnosis Date Noted   Septic shock ([place]) 12/25/2020   Educated about COVID-19 virus infection 02/02/2020   Iron deficiency anemia due to chronic blood loss    Occult blood in stools    Primary osteoarthritis of both knees 05/14/2019   Unprovoked DVT and Unprovoked Pulmonary Embolism -Dxed 03/2019 04/03/2019   Sciatica of right side 10/18/2018   Seborrheic keratosis 10/17/2016   Hyperlipidemia associated with type 2 diabetes mellitus ([place]) 10/17/2016   Overactive bladder  10/17/2016   Gastroesophageal reflux disease without esophagitis 10/17/2016   Primary osteoarthritis involving multiple joints 10/17/2016   Chronic gout without tophus 10/17/2016   Type 2 diabetes mellitus without complication, without long-term current use of insulin ([place]) 05/16/2016   Essential hypertension 05/16/2016   Hypothyroidism 05/16/2016   Morbid obesity ([place]) 10/30/2013   S/P right THA, AA 10/29/2013    Conditions to be addressed/monitored:HTN, DMII, and recent sepsis due  to UTI  Care Plan : RNCM: Care Coordinatino after Hospitalization  Updates made by [name] China, RN since 12/31/2020 12:00 AM     Problem: Care Coordination Needs s/p Hospitalization for Sepsis secondary to UTI   Priority: High     Goal: Follow Instructions for Post Discharge Care   Start Date: 12/31/2020  This Visit's Progress: On track  Priority: High  Note:   Current Barriers:  Care Coordination needs related to post hospital discharge care in a patient with recent sepsis secondary to UTI Unable to independently stay by herself presently Comorbidities: HTN, HLD, DM, hypothyroidism, OA, osteoporosis, anemia  Nurse Case Manager Clinical Goal(s):  patient will work with urologist to address needs related to recent sepsis secondary to UTI and renal stone patient will meet with RN Care Manager to address self-management of chronic medical conditions and ADLs the patient will demonstrate ongoing self health care management ability as evidenced by attending all scheduled medical appointments and by reaching out to PCP or specialist with any new or worsening symptoms*  Interventions:  1:1 collaboration with [name] Brooklyn, FNP regarding development and update of comprehensive plan of care as evidenced by provider attestation and co-signature Inter-disciplinary care team collaboration (see longitudinal plan of care) Chart reviewed including relevant office notes, Hosp notes, correspondence notes, lab results, and imaging reports Evaluation of current treatment plan related to sepsis and patient's adherence to plan as established by provider. Reviewed medications with patient and discussed importance of medication adherence Discussed hospital stay and discharge instructions Reviewed upcoming appointments: Urologist on 01/13/21 for hosp f/u Discussed orders for home health physical therapy through [place] and Oxygen through [facility] Patient has not been contacted by [facility]  yet but expects oxygen delivery today. She was sent home with oxygen from the hospital to last until concentrator arrives.  Encouraged to seek appropriate medical attention for any new or worsening symptoms Encouraged proper hydration Discussed plans with patient for ongoing care management follow up and provided patient with direct contact information for care management team  Self Care Activities:  Self administers medications as prescribed Calls pharmacy for medication refills Calls provider office for new concerns or questions  Patient Goals Over the next 30 days, patient will: Follow-up with urologist Take medication as prescribed Start home health physical therapy Obtain home oxygen concentrator Talk with RN care manager about oxygen delivery and home health schedule Seek appropriate medical attention for any new or worsening symptoms Call [place] as needed [phone]  Follow Up Plan:  Telephone follow up appointment with care management team member scheduled for: 01/01/21 with RNCM to verify that oxygen has been delivered and home health has made contact The patient has been provided with contact information for the care management team and has been advised to call with any health related questions or concerns.       Chong Sicilian, BSN, RN-BC Embedded Chronic Care Manager [facility] / [place] Management Direct Dial: [phone]

## 2020-12-31 NOTE — Patient Instructions (Signed)
Visit Information  PATIENT GOALS:  Goals Addressed             This Visit's Progress    Follow Instructions for Post Discharge Care       Timeframe:  Short-Term Goal Priority:  High Start Date:      12/31/20                        Expected End Date:   02/21/21                    Follow-up: 01/01/21  Follow-up with urologist Take medication as prescribed Start home health physical therapy Obtain home oxygen concentrator Talk with RN care manager about oxygen delivery and home health schedule Seek appropriate medical attention for any new or worsening symptoms Call RN Care Manager as needed [phone]         Patient verbalizes understanding of instructions provided today and agrees to view in [place].   Follow Up Plan:  Telephone follow up appointment with care management team member scheduled for: 01/01/21 with RNCM to verify that oxygen has been delivered and home health has made contact The patient has been provided with contact information for the care management team and has been advised to call with any health related questions or concerns.   Chong Sicilian, BSN, RN-BC Embedded Chronic Care Manager [facility] / [place] Management Direct Dial: [phone]

## 2021-01-01 ENCOUNTER — Encounter: Payer: Self-pay | Admitting: *Deleted

## 2021-01-01 ENCOUNTER — Telehealth: Payer: Self-pay

## 2021-01-01 ENCOUNTER — Ambulatory Visit: Payer: Medicare Other | Admitting: *Deleted

## 2021-01-01 DIAGNOSIS — D649 Anemia, unspecified: Secondary | ICD-10-CM | POA: Diagnosis not present

## 2021-01-01 DIAGNOSIS — N136 Pyonephrosis: Secondary | ICD-10-CM | POA: Diagnosis not present

## 2021-01-01 DIAGNOSIS — E119 Type 2 diabetes mellitus without complications: Secondary | ICD-10-CM

## 2021-01-01 DIAGNOSIS — N179 Acute kidney failure, unspecified: Secondary | ICD-10-CM | POA: Diagnosis not present

## 2021-01-01 DIAGNOSIS — I480 Paroxysmal atrial fibrillation: Secondary | ICD-10-CM | POA: Diagnosis not present

## 2021-01-01 DIAGNOSIS — M170 Bilateral primary osteoarthritis of knee: Secondary | ICD-10-CM | POA: Diagnosis not present

## 2021-01-01 DIAGNOSIS — M1612 Unilateral primary osteoarthritis, left hip: Secondary | ICD-10-CM | POA: Diagnosis not present

## 2021-01-01 DIAGNOSIS — J9601 Acute respiratory failure with hypoxia: Secondary | ICD-10-CM | POA: Diagnosis not present

## 2021-01-01 DIAGNOSIS — I10 Essential (primary) hypertension: Secondary | ICD-10-CM | POA: Diagnosis not present

## 2021-01-01 DIAGNOSIS — A419 Sepsis, unspecified organism: Secondary | ICD-10-CM

## 2021-01-01 DIAGNOSIS — M19041 Primary osteoarthritis, right hand: Secondary | ICD-10-CM | POA: Diagnosis not present

## 2021-01-01 DIAGNOSIS — E785 Hyperlipidemia, unspecified: Secondary | ICD-10-CM | POA: Diagnosis not present

## 2021-01-01 DIAGNOSIS — M19042 Primary osteoarthritis, left hand: Secondary | ICD-10-CM | POA: Diagnosis not present

## 2021-01-01 NOTE — Patient Instructions (Signed)
Visit Information  PATIENT GOALS:  Goals Addressed             This Visit's Progress    Follow Instructions for Post Discharge Care   On track    Timeframe:  Short-Term Goal Priority:  High Start Date:      12/31/20                        Expected End Date:   02/21/21                    Follow-up: 02/02/21  Follow-up with urologist Take medication as prescribed Start home health physical therapy Work with [place] regarding any questions or concerns about oxygen concentrator Seek appropriate medical attention for any new or worsening symptoms Call RN Care Manager as needed [phone]         Patient verbalizes understanding of instructions provided today and agrees to view in [place].   Follow Up Plan:  Telephone follow up appointment with care management team member scheduled for: 02/02/21 with RNCM to verify that oxygen has been delivered and home health has made contact The patient has been provided with contact information for the care management team and has been advised to call with any health related questions or concerns.   Chong Sicilian, BSN, RN-BC Embedded Chronic Care Manager [facility] / [place] Management Direct Dial: [phone]

## 2021-01-01 NOTE — Telephone Encounter (Signed)
Transition Care Management Follow-up Telephone Call Date of discharge and from where: [name] [name] 12/30/20 Diagnosis: septic shock secondary to UTI How have you been since you were released from the hospital? Much better! Any questions or concerns? No  Items Reviewed: Did the pt receive and understand the discharge instructions provided? Yes  Medications obtained and verified? Yes  Other? No  Any new allergies since your discharge? No  Dietary orders reviewed? Yes Do you have support at home? Yes   Home Care and Equipment/Supplies: Were home health services ordered? yes If so, what is the name of the agency? [facility]  Has the agency set up a time to come to the patient's home? They have called and are supposed to come at lunch time today Were any new equipment or medical supplies ordered?  Yes: oxygen What is the name of the medical supply agency? [employer] Were you able to get the supplies/equipment? yes Do you have any questions related to the use of the equipment or supplies? No  Functional Questionnaire: (I = Independent and D = Dependent) ADLs: I  Bathing/Dressing- I  Meal Prep- I  Eating- I  Maintaining continence- I  Transferring/Ambulation- I  Managing Meds- I  Follow up appointments reviewed:  PCP Hospital f/u appt confirmed? Yes  Scheduled to see Hendricks [name] on 01/05/21 @ 10. [facility] f/u appt confirmed? Yes  Scheduled to see Urology on 01/13/21 Are transportation arrangements needed? No  If their condition worsens, is the pt aware to call PCP or go to the Emergency Dept.? Yes Was the patient provided with contact information for the PCP's office or ED? Yes Was to pt encouraged to call back with questions or concerns? Yes

## 2021-01-01 NOTE — Chronic Care Management (AMB) (Signed)
Chronic Care Management   CCM RN Visit Note  01/01/2021 Name: [name] [name] MRN: [id number] DOB: 02-23-1941  Subjective: [name] [name] is a 80 y.o. year old female who is a primary care patient of Loman Brooklyn, FNP. The care management team was consulted for assistance with disease management and care coordination needs.    Collaboration with Adalberto Cole, LPN with [facility]  for  Care Coordination  in response to provider referral for case management and/or care coordination services.   Consent to Services:  The patient was given information about Chronic Care Management services, agreed to services, and gave verbal consent prior to initiation of services.  Please see initial visit note for detailed documentation.   Patient agreed to services and verbal consent obtained.   Assessment: Review of patient past medical history, allergies, medications, health status, including review of consultants reports, laboratory and other test data, was performed as part of comprehensive evaluation and provision of chronic care management services.   SDOH (Social Determinants of Health) assessments and interventions performed:    CCM Care Plan  No Known Allergies  Outpatient Encounter Medications as of 01/01/2021  Medication Sig   acetaminophen (TYLENOL) 325 MG tablet Take 2 tablets (650 mg total) by mouth every 6 (six) hours as needed for mild pain or headache (or Fever >/= 101).   allopurinol (ZYLOPRIM) 100 MG tablet TAKE 1 TABLET BY MOUTH  DAILY   apixaban (ELIQUIS) 2.5 MG TABS tablet Take 1 tablet (2.5 mg total) by mouth 2 (two) times daily.   atorvastatin (LIPITOR) 20 MG tablet Take 1 tablet (20 mg total) by mouth daily.   cephALEXin (KEFLEX) 500 MG capsule Take 1 capsule (500 mg total) by mouth 3 (three) times daily for 3 days. Stop taking after 6/10. Resume the remaining medication the day before stone removal and continue for the day after until gone.   Cholecalciferol (VITAMIN D-3)  5000 UNITS TABS Take 5,000 Units by mouth daily.   diclofenac Sodium (VOLTAREN) 1 % GEL Apply 4 g topically 4 (four) times daily.   diltiazem (CARTIA XT) 240 MG 24 hr capsule Take 1 capsule (240 mg total) by mouth daily.   EUTHYROX 50 MCG tablet TAKE 1 TABLET BY MOUTH ONCE DAILY BEFORE BREAKFAST   ferrous sulfate 325 (65 FE) MG tablet Take 325 mg by mouth 2 (two) times daily.   furosemide (LASIX) 20 MG tablet TAKE 1 TABLET BY MOUTH  TWICE DAILY   hydrocortisone cream 0.5 % Apply topically 2 (two) times daily.   loratadine (CLARITIN) 10 MG tablet Take 10 mg by mouth daily.   ONE TOUCH ULTRA TEST test strip USE UP TO FOUR TIMES DAILY AS DIRECTED   oxybutynin (DITROPAN-XL) 10 MG 24 hr tablet Take 1 tablet (10 mg total) by mouth at bedtime.   pantoprazole (PROTONIX) 40 MG tablet Take 1 tablet (40 mg total) by mouth 2 (two) times daily before a meal.   No facility-administered encounter medications on file as of 01/01/2021.    Patient Active Problem List   Diagnosis Date Noted   Septic shock ([place]) 12/25/2020   Educated about COVID-19 virus infection 02/02/2020   Iron deficiency anemia due to chronic blood loss    Occult blood in stools    Primary osteoarthritis of both knees 05/14/2019   Unprovoked DVT and Unprovoked Pulmonary Embolism -Dxed 03/2019 04/03/2019   Sciatica of right side 10/18/2018   Seborrheic keratosis 10/17/2016   Hyperlipidemia associated with type 2 diabetes mellitus ([place]) 10/17/2016  Overactive bladder 10/17/2016   Gastroesophageal reflux disease without esophagitis 10/17/2016   Primary osteoarthritis involving multiple joints 10/17/2016   Chronic gout without tophus 10/17/2016   Type 2 diabetes mellitus without complication, without long-term current use of insulin ([place]) 05/16/2016   Essential hypertension 05/16/2016   Hypothyroidism 05/16/2016   Morbid obesity ([place]) 10/30/2013   S/P right THA, AA 10/29/2013    Conditions to be addressed/monitored:HTN, DMII, and  recent hospital stay for septic shock secondary to UTI  Care Plan : RNCM: Care Coordinatino after Hospitalization  Updates made by Ilean China, RN since 01/01/2021 12:00 AM     Problem: Care Coordination Needs s/p Hospitalization for Sepsis secondary to UTI   Priority: High     Goal: Follow Instructions for Post Discharge Care   Start Date: 12/31/2020  This Visit's Progress: On track  Recent Progress: On track  Priority: High  Note:   Current Barriers:  Care Coordination needs related to post hospital discharge care in a patient with recent sepsis secondary to UTI Unable to independently stay by herself presently Comorbidities: HTN, HLD, DM, hypothyroidism, OA, osteoporosis, anemia  Nurse Case Manager Clinical Goal(s):  patient will work with urologist to address needs related to recent sepsis secondary to UTI and renal stone patient will meet with RN Care Manager to address self-management of chronic medical conditions and ADLs the patient will demonstrate ongoing self health care management ability as evidenced by attending all scheduled medical appointments and by reaching out to PCP or specialist with any new or worsening symptoms*  Interventions:  1:1 collaboration with Loman Brooklyn, FNP regarding development and update of comprehensive plan of care as evidenced by provider attestation and co-signature Inter-disciplinary care team collaboration (see longitudinal plan of care) Chart reviewed including relevant office notes, Hosp notes, correspondence notes, lab results, and imaging reports Evaluation of current treatment plan related to sepsis and patient's adherence to plan as established by provider. Previously reviewed medications with patient and discussed importance of medication adherence Previously discussed hospital stay and discharge instructions Previously reviewed upcoming appointments: Urologist on 01/13/21 for hosp f/u Previously discussed orders for home health  physical therapy through [place] and Oxygen through [facility] Patient has not been contacted by [facility] yet but expects oxygen delivery today. She was sent home with oxygen from the hospital to last until concentrator arrives.  Collaborated with Amy Hopkins, LPN with [facility]'s [facility] clinic, and verified that oxygen was delivered today and that home health physical therapy has scheduled a visit for an evaluation Previously encouraged to seek appropriate medical attention for any new or worsening symptoms Previously encouraged proper hydration Previously discussed plans with patient for ongoing care management follow up and provided patient with direct contact information for care management team  Self Care Activities:  Self administers medications as prescribed Calls pharmacy for medication refills Calls provider office for new concerns or questions  Patient Goals Over the next 30 days, patient will: Follow-up with urologist Take medication as prescribed Start home health physical therapy Work with [place] regarding any questions or concerns about oxygen concentrator Seek appropriate medical attention for any new or worsening symptoms Call [place] as needed [phone]  Follow Up Plan:  Telephone follow up appointment with care management team member scheduled for: 02/02/21 with RNCM to verify that oxygen has been delivered and home health has made contact The patient has been provided with contact information for the care management team and has been advised  to call with any health related questions or concerns.      Chong Sicilian, BSN, RN-BC Embedded Chronic Care Manager [facility] / [place] Management Direct Dial: [phone]

## 2021-01-04 ENCOUNTER — Other Ambulatory Visit: Payer: Self-pay | Admitting: Urology

## 2021-01-04 DIAGNOSIS — M19042 Primary osteoarthritis, left hand: Secondary | ICD-10-CM | POA: Diagnosis not present

## 2021-01-04 DIAGNOSIS — J9601 Acute respiratory failure with hypoxia: Secondary | ICD-10-CM | POA: Diagnosis not present

## 2021-01-04 DIAGNOSIS — I10 Essential (primary) hypertension: Secondary | ICD-10-CM | POA: Diagnosis not present

## 2021-01-04 DIAGNOSIS — D649 Anemia, unspecified: Secondary | ICD-10-CM | POA: Diagnosis not present

## 2021-01-04 DIAGNOSIS — E119 Type 2 diabetes mellitus without complications: Secondary | ICD-10-CM | POA: Diagnosis not present

## 2021-01-04 DIAGNOSIS — I480 Paroxysmal atrial fibrillation: Secondary | ICD-10-CM | POA: Diagnosis not present

## 2021-01-04 DIAGNOSIS — M19041 Primary osteoarthritis, right hand: Secondary | ICD-10-CM | POA: Diagnosis not present

## 2021-01-04 DIAGNOSIS — M170 Bilateral primary osteoarthritis of knee: Secondary | ICD-10-CM | POA: Diagnosis not present

## 2021-01-04 DIAGNOSIS — N136 Pyonephrosis: Secondary | ICD-10-CM | POA: Diagnosis not present

## 2021-01-04 DIAGNOSIS — E785 Hyperlipidemia, unspecified: Secondary | ICD-10-CM | POA: Diagnosis not present

## 2021-01-04 DIAGNOSIS — N179 Acute kidney failure, unspecified: Secondary | ICD-10-CM | POA: Diagnosis not present

## 2021-01-04 DIAGNOSIS — M1612 Unilateral primary osteoarthritis, left hip: Secondary | ICD-10-CM | POA: Diagnosis not present

## 2021-01-05 ENCOUNTER — Ambulatory Visit (INDEPENDENT_AMBULATORY_CARE_PROVIDER_SITE_OTHER): Payer: Medicare Other | Admitting: Family Medicine

## 2021-01-05 ENCOUNTER — Other Ambulatory Visit: Payer: Self-pay | Admitting: Urology

## 2021-01-05 ENCOUNTER — Encounter: Payer: Self-pay | Admitting: Family Medicine

## 2021-01-05 ENCOUNTER — Telehealth: Payer: Self-pay | Admitting: Cardiology

## 2021-01-05 ENCOUNTER — Other Ambulatory Visit: Payer: Self-pay

## 2021-01-05 VITALS — BP 119/76 | HR 89 | Temp 97.9°F

## 2021-01-05 DIAGNOSIS — R0902 Hypoxemia: Secondary | ICD-10-CM

## 2021-01-05 DIAGNOSIS — N179 Acute kidney failure, unspecified: Secondary | ICD-10-CM

## 2021-01-05 DIAGNOSIS — Z87440 Personal history of urinary (tract) infections: Secondary | ICD-10-CM

## 2021-01-05 DIAGNOSIS — I10 Essential (primary) hypertension: Secondary | ICD-10-CM

## 2021-01-05 DIAGNOSIS — S31819D Unspecified open wound of right buttock, subsequent encounter: Secondary | ICD-10-CM | POA: Diagnosis not present

## 2021-01-05 DIAGNOSIS — D649 Anemia, unspecified: Secondary | ICD-10-CM

## 2021-01-05 DIAGNOSIS — N201 Calculus of ureter: Secondary | ICD-10-CM | POA: Diagnosis not present

## 2021-01-05 NOTE — Progress Notes (Addendum)
COVID Vaccine Completed: Yes x3 Date COVID Vaccine completed: 09/18/19, 10/16/19, 07/02/20 Has received booster: Yes x1 COVID vaccine manufacturer:  Moderna    Date of COVID positive in last 90 days: N/A  PCP - [name], [place] Cardiologist - Minus Breeding, MD  Chest x-ray - 12/30/20 Epic EKG - 12/25/20 Epic Stress Test - N/A ECHO - 04/03/19 Epic Cardiac Cath - N/A Zio patch- 02/21/20 Epic Pacemaker/ICD device last checked: Spinal Cord Stimulator:  Sleep Study - N/A CPAP -   Fasting Blood Sugar - 92-100s Checks Blood Sugar few times a week  Blood Thinner Instructions: apixiban, hold 3 days before surgery. Aspirin Instructions: Last Dose: 01/11/21  Activity level: Pt unable to walk up and down stairs. She can complete ADLs without SOB or CP.     Anesthesia review: HTN, DM type 2, PE, on O2 2L at hs PRN, A fib due to sepsis, quarter size bed sore to left hip no redness or drainage noted (acquired in hospital in June)   Patient denies shortness of breath, fever, cough and chest pain at PAT appointment   Patient verbalized understanding of instructions that were given to them at the PAT appointment. Patient was also instructed that they will need to review over the PAT instructions again at home before surgery.

## 2021-01-05 NOTE — Patient Instructions (Addendum)
DUE TO COVID-19 ONLY ONE VISITOR IS ALLOWED TO COME WITH YOU AND STAY IN THE WAITING ROOM ONLY DURING PRE OP AND PROCEDURE.   **NO VISITORS ARE ALLOWED IN THE SHORT STAY AREA OR RECOVERY ROOM!!**        Your procedure is scheduled on: 01/15/21   Report to [facility] Main  Entrance    Report to admitting at 12:30 PM   Call this number if you have problems the morning of surgery [phone]   Do not eat food :After Midnight.   May have liquids until 11:30 AM day of surgery  CLEAR LIQUID DIET  Foods Allowed                                                                     Foods Excluded  Water, Black Coffee and tea, regular and decaf               liquids that you cannot  Plain Jell-O in any flavor  (No red)                                     see through such as: Fruit ices (not with fruit pulp)                                             milk, soups, orange juice              Iced Popsicles (No red)                                                 All solid food                                   Apple juices Sports drinks like Gatorade (No red) Lightly seasoned clear broth or consume(fat free) Sugar, honey syrup    Oral Hygiene is also important to reduce your risk of infection.                                    Remember - BRUSH YOUR TEETH THE MORNING OF SURGERY WITH YOUR REGULAR TOOTHPASTE  Take these medicines the morning of surgery with A SIP OF WATER: Allopurinol, Atorvastatin, Diltiazem, Euthyrox, Loratadine, Oxybutynin, Pantoprazole.  DO NOT TAKE ANY ORAL DIABETIC MEDICATIONS DAY OF YOUR SURGERY  How to Manage Your Diabetes Before and After Surgery  Why is it important to control my blood sugar before and after surgery? Improving blood sugar levels before and after surgery helps healing and can limit problems. A way of improving blood sugar control is eating a healthy diet by:  Eating less sugar and carbohydrates  Increasing activity/exercise  Talking  with your doctor about reaching your blood sugar goals High blood sugars (greater  than 180 mg/dL) can raise your risk of infections and slow your recovery, so you will need to focus on controlling your diabetes during the weeks before surgery. Make sure that the doctor who takes care of your diabetes knows about your planned surgery including the date and location.  How do I manage my blood sugar before surgery? Check your blood sugar at least 4 times a day, starting 2 days before surgery, to make sure that the level is not too high or low. Check your blood sugar the morning of your surgery when you wake up and every 2 hours until you get to the Short Stay unit. If your blood sugar is less than 70 mg/dL, you will need to treat for low blood sugar: Do not take insulin. Treat a low blood sugar (less than 70 mg/dL) with  cup of clear juice (cranberry or apple), 4 glucose tablets, OR glucose gel. Recheck blood sugar in 15 minutes after treatment (to make sure it is greater than 70 mg/dL). If your blood sugar is not greater than 70 mg/dL on recheck, call [phone] for further instructions. Report your blood sugar to the short stay nurse when you get to Short Stay.  If you are admitted to the hospital after surgery: Your blood sugar will be checked by the staff and you will probably be given insulin after surgery (instead of oral diabetes medicines) to make sure you have good blood sugar levels. The goal for blood sugar control after surgery is 80-180 mg/dL.   Reviewed and Endorsed by [facility] Patient Education Committee, August 2015                               You may not have any metal on your body including hair pins, jewelry, and body piercing             Do not wear make-up, lotions, powders, perfumes, or deodorant  Do not wear nail polish including gel and S&S, artificial/acrylic nails, or any other type of covering on natural nails including finger and toenails. If you have  artificial nails, gel coating, etc. that needs to be removed by a nail salon please have this removed prior to surgery or surgery may need to be canceled/ delayed if the surgeon/ anesthesia feels like they are unable to be safely monitored.   Do not shave  48 hours prior to surgery.    Do not bring valuables to the hospital. [place].   Contacts, dentures or bridgework may not be worn into surgery.    Patients discharged the day of surgery will not be allowed to drive home.  Special Instructions: Bring a copy of your healthcare power of attorney and living will documents         the day of surgery if you haven't scanned them in before.              Please read over the following fact sheets you were given: IF YOU HAVE QUESTIONS ABOUT YOUR PRE OP INSTRUCTIONS PLEASE CALL [phone]   [place] - Preparing for Surgery Before surgery, you can play an important role.  Because skin is not sterile, your skin needs to be as free of germs as possible.  You can reduce the number of germs on your skin by washing with CHG (chlorahexidine gluconate)  soap before surgery.  CHG is an antiseptic cleaner which kills germs and bonds with the skin to continue killing germs even after washing. Please DO NOT use if you have an allergy to CHG or antibacterial soaps.  If your skin becomes reddened/irritated stop using the CHG and inform your nurse when you arrive at Short Stay. Do not shave (including legs and underarms) for at least 48 hours prior to the first CHG shower.  You may shave your face/neck.  Please follow these instructions carefully:  1.  Shower with CHG Soap the night before surgery and the  morning of surgery.  2.  If you choose to wash your hair, wash your hair first as usual with your normal  shampoo.  3.  After you shampoo, rinse your hair and body thoroughly to remove the shampoo.                             4.  Use CHG as you would any other  liquid soap.  You can apply chg directly to the skin and wash.  Gently with a scrungie or clean washcloth.  5.  Apply the CHG Soap to your body ONLY FROM THE NECK DOWN.   Do   not use on face/ open                           Wound or open sores. Avoid contact with eyes, ears mouth and   genitals (private parts).                       Wash face,  Genitals (private parts) with your normal soap.             6.  Wash thoroughly, paying special attention to the area where your    surgery  will be performed.  7.  Thoroughly rinse your body with warm water from the neck down.  8.  DO NOT shower/wash with your normal soap after using and rinsing off the CHG Soap.                9.  Pat yourself dry with a clean towel.            10.  Wear clean pajamas.            11.  Place clean sheets on your bed the night of your first shower and do not  sleep with pets. Day of Surgery : Do not apply any lotions/deodorants the morning of surgery.  Please wear clean clothes to the hospital/surgery center.  FAILURE TO FOLLOW THESE INSTRUCTIONS MAY RESULT IN THE CANCELLATION OF YOUR SURGERY  PATIENT SIGNATURE_________________________________  NURSE SIGNATURE__________________________________  ________________________________________________________________________

## 2021-01-05 NOTE — Progress Notes (Signed)
Assessment & Plan:  1. History of UTI Antibiotics completed. - CMP14+EGFR  2. Right ureteral stone Scheduled to follow up with urology next week with repeat urine culture this week.   3. Acute kidney injury ([place]) Labs to assess. - CMP14+EGFR  4. Normocytic anemia Labs to assess. - CBC with Differential/Platelet  5. Essential hypertension Well controlled on current regimen. Scheduled to see cardiology next week.   6. Hypoxia Currently wearing oxygen and maintaining oxygen saturation >94% due to her report. Discussed weaning by 0.5L over time to keep saturations >90%.   7. Wound of right buttock, subsequent encounter Continue Mepilex dressings.   8. Hypomagnesemia Labs to assess. - Magnesium   Follow up plan: Return as scheduled.  [name], MSN, APRN, FNP-C [facility]  Subjective:   Patient ID: [name] [name], female    DOB: 02/05/1941, 80 y.o.   MRN: [id number]  HPI: [name] [name] is a 80 y.o. female presenting on 01/05/2021 for Transitions Of Care ([facility] 12/30/20 septic shock secondary to UTI)  Patient is accompanied by her son, Nicki Reaper, who she is okay with being present.   Patient was admitted to [facility] long hospital 12/24/2020-12/30/2020 due to septic shock secondary to a urinary tract infection.  Patient had a right proximal ureteral stone causing right hydroureteronephrosis.  She underwent ureteral stenting by urology.  Urine cultures grew E. coli.  Patient was initially on ceftriaxone which was changed to cephalexin by infectious disease.  She has a follow-up with urology scheduled for 01/15/2021, but states they called and asked her to come for a urine culture on 01/07/2021.  Acute kidney injury in the setting of hypovolemia, obstruction, and infection.  Normocytic anemia with no overt bleeding.  Atrial fibrillation due to sepsis.  She was on amiodarone and transition to diltiazem.  She was already taking Eliquis twice daily.  Since she  was started on diltiazem her amlodipine and valsartan were held at discharge.  Simvastatin was changed to atorvastatin due to initiation of diltiazem.  Scheduled to follow-up with Dr. Percival Spanish on 01/20/2021.    She has tolerated medication changes without problems. Home health was ordered at discharge and have been out.  She was discharged with home oxygen at 2L via nasal cannula due to hypoxia.  She does have a pulse oximeter at home. She reports she has been monitoring her oxygen saturation on and off oxygen.  She has received her oxygen from [facility].    Patient reports she did develop a wound while she was in the hospital on her right buttock. She has a Mepilex dressing in place that is being changed every three days.    ROS: Negative unless specifically indicated above in HPI.   Relevant past medical history reviewed and updated as indicated.   Allergies and medications reviewed and updated.   Current Outpatient Medications:    acetaminophen (TYLENOL) 325 MG tablet, Take 2 tablets (650 mg total) by mouth every 6 (six) hours as needed for mild pain or headache (or Fever >/= 101)., Disp: 12 tablet, Rfl: 0   allopurinol (ZYLOPRIM) 100 MG tablet, TAKE 1 TABLET BY MOUTH  DAILY, Disp: 90 tablet, Rfl: 0   apixaban (ELIQUIS) 2.5 MG TABS tablet, Take 1 tablet (2.5 mg total) by mouth 2 (two) times daily., Disp: 180 tablet, Rfl: 3   atorvastatin (LIPITOR) 20 MG tablet, Take 1 tablet (20 mg total) by mouth daily., Disp: 30 tablet, Rfl: 2   Cholecalciferol (VITAMIN D-3) 5000 UNITS TABS,  Take 5,000 Units by mouth daily., Disp: , Rfl:    diclofenac Sodium (VOLTAREN) 1 % GEL, Apply 4 g topically 4 (four) times daily., Disp: 100 g, Rfl: 2   diltiazem (CARTIA XT) 240 MG 24 hr capsule, Take 1 capsule (240 mg total) by mouth daily., Disp: 30 capsule, Rfl: 2   EUTHYROX 50 MCG tablet, TAKE 1 TABLET BY MOUTH ONCE DAILY BEFORE BREAKFAST, Disp: 30 tablet, Rfl: 10   ferrous sulfate 325 (65 FE) MG tablet, Take  325 mg by mouth 2 (two) times daily., Disp: , Rfl:    furosemide (LASIX) 20 MG tablet, TAKE 1 TABLET BY MOUTH  TWICE DAILY, Disp: 180 tablet, Rfl: 0   hydrocortisone cream 0.5 %, Apply topically 2 (two) times daily., Disp: 30 g, Rfl: 0   loratadine (CLARITIN) 10 MG tablet, Take 10 mg by mouth daily., Disp: , Rfl:    ONE TOUCH ULTRA TEST test strip, USE UP TO FOUR TIMES DAILY AS DIRECTED, Disp: 400 each, Rfl: 3   oxybutynin (DITROPAN-XL) 10 MG 24 hr tablet, Take 1 tablet (10 mg total) by mouth at bedtime., Disp: 90 tablet, Rfl: 1   pantoprazole (PROTONIX) 40 MG tablet, Take 1 tablet (40 mg total) by mouth 2 (two) times daily before a meal., Disp: 180 tablet, Rfl: 1  No Known Allergies  Objective:   BP 119/76   Pulse 89   Temp 97.9 F (36.6 C) (Temporal)   SpO2 97%    Physical Exam Vitals reviewed.  Constitutional:      General: She is not in acute distress.    Appearance: Normal appearance. She is not ill-appearing, toxic-appearing or diaphoretic.  HENT:     Head: Normocephalic and atraumatic.  Eyes:     General: No scleral icterus.       Right eye: No discharge.        Left eye: No discharge.     Conjunctiva/sclera: Conjunctivae normal.  Cardiovascular:     Rate and Rhythm: Normal rate and regular rhythm.     Heart sounds: Normal heart sounds. No murmur heard.   No friction rub. No gallop.  Pulmonary:     Effort: Pulmonary effort is normal. No respiratory distress.     Breath sounds: Normal breath sounds. No stridor. No wheezing, rhonchi or rales.  Musculoskeletal:        General: Normal range of motion.     Cervical back: Normal range of motion.  Skin:    General: Skin is warm and dry.     Capillary Refill: Capillary refill takes less than 2 seconds.     Comments: Right buttock with two small wounds where top layer of skin is gone. Surrounding skin is blanchable. Serous drainage on the dressing. No erythema or odor.   Neurological:     General: No focal deficit present.      Mental Status: She is alert and oriented to person, place, and time. Mental status is at baseline.     Gait: Gait abnormal (riding in [facility]).  Psychiatric:        Mood and Affect: Mood normal.        Behavior: Behavior normal.        Thought Content: Thought content normal.        Judgment: Judgment normal.

## 2021-01-05 NOTE — Telephone Encounter (Signed)
Patient with diagnosis of unprovoked PE on Eliquis for anticoagulation.    Procedure: Cystoscopy, right retrograde pyelogram, right ureteroscopy, laser lithotripsy, stent placement   Date of procedure: 01/15/21  CrCl 51 ml/min Platelet count 142  Per office protocol, patient can hold Eliquis for 2 days prior to procedure.

## 2021-01-05 NOTE — Telephone Encounter (Signed)
   Name: [name] [name]  DOB: 08/21/1940  MRN: [id number]  Primary Cardiologist: None  Chart reviewed as part of pre-operative protocol coverage. [name] [name] has a follow up with Dr. Percival Spanish on 01/20/21 however her surgery is scheduled for 01/15/21. Is there any availability to mover her appointment up as I think she will need to be seen prior to surgery to evaluate bradycardia and atrial fibrillation.   Pre-op covering staff: - Please schedule appointment and call patient to inform them.   Kathyrn Drown, NP  01/05/2021, 1:21 PM

## 2021-01-05 NOTE — Telephone Encounter (Signed)
   [facility] Pre-operative Risk Assessment    Patient Name: [name] [name]  DOB: [birth year]  MRN: [id number]   HEARTCARE STAFF: - Please ensure there is not already an duplicate clearance open for this procedure. - Under Visit Info/Reason for Call, type in Other and utilize the format Clearance MM/DD/YY or Clearance TBD. Do not use dashes or single digits. - If request is for dental extraction, please clarify the # of teeth to be extracted. - If the patient is currently at the dentist's office, call Pre-Op APP to address. If the patient is not currently in the dentist office, please route to the Pre-Op pool  Request for surgical clearance:  What type of surgery is being performed? Cystoscopy, right retrograde pyelogram, right ureteroscopy, laser lithotripsy, stent placement    When is this surgery scheduled? 01/15/2021   What type of clearance is required (medical clearance vs. Pharmacy clearance to hold med vs. Both)? Medical & phrmacy  Are there any medications that need to be held prior to surgery and how long? [place] name and name of physician performing surgery? [facility], Dr. Rexene Alberts   What is the office phone number? [phone]   7.   What is the office fax number? [phone]  8.   Anesthesia type (None, local, MAC, general) ? general   Kamira J Martinique 01/05/2021, 11:20 AM  _________________________________________________________________   (provider comments below)

## 2021-01-05 NOTE — Telephone Encounter (Signed)
Pt is currently scheduled to see Dr. Percival Spanish 01/20/21, though she is having surgery/procedure on 01/15/21. Called the pt to see if we could move her appt up sooner with Dr. Percival Spanish that was set for 01/20/21 in the [place] location. Pt is agreeable to move appt up sooner so that her surgery is not delayed. Pt is willing to see Dr. Percival Spanish at the [place] location. Pt is aware of address for [facility] office. Pt scheduled to Dr. Percival Spanish 01/08/21 @ 10:20 at [facility] office. Pt is so grateful for the help today. I did cancel the 01/20/21 appt with Dr. Percival Spanish in the [place] location, pt is agreeable. I will forward clearance notes to MD for upcoming appt. I will send FYI to surgeon's office pt has appt 01/08/21.

## 2021-01-06 ENCOUNTER — Telehealth: Payer: Self-pay | Admitting: *Deleted

## 2021-01-06 DIAGNOSIS — M19041 Primary osteoarthritis, right hand: Secondary | ICD-10-CM | POA: Diagnosis not present

## 2021-01-06 DIAGNOSIS — D649 Anemia, unspecified: Secondary | ICD-10-CM | POA: Diagnosis not present

## 2021-01-06 DIAGNOSIS — M170 Bilateral primary osteoarthritis of knee: Secondary | ICD-10-CM | POA: Diagnosis not present

## 2021-01-06 DIAGNOSIS — N179 Acute kidney failure, unspecified: Secondary | ICD-10-CM | POA: Diagnosis not present

## 2021-01-06 DIAGNOSIS — J9601 Acute respiratory failure with hypoxia: Secondary | ICD-10-CM | POA: Diagnosis not present

## 2021-01-06 DIAGNOSIS — M1612 Unilateral primary osteoarthritis, left hip: Secondary | ICD-10-CM | POA: Diagnosis not present

## 2021-01-06 DIAGNOSIS — E785 Hyperlipidemia, unspecified: Secondary | ICD-10-CM | POA: Diagnosis not present

## 2021-01-06 DIAGNOSIS — I10 Essential (primary) hypertension: Secondary | ICD-10-CM | POA: Diagnosis not present

## 2021-01-06 DIAGNOSIS — N136 Pyonephrosis: Secondary | ICD-10-CM | POA: Diagnosis not present

## 2021-01-06 DIAGNOSIS — M19042 Primary osteoarthritis, left hand: Secondary | ICD-10-CM | POA: Diagnosis not present

## 2021-01-06 DIAGNOSIS — E119 Type 2 diabetes mellitus without complications: Secondary | ICD-10-CM | POA: Diagnosis not present

## 2021-01-06 DIAGNOSIS — I480 Paroxysmal atrial fibrillation: Secondary | ICD-10-CM | POA: Diagnosis not present

## 2021-01-06 LAB — CBC WITH DIFFERENTIAL/PLATELET
Basophils Absolute: 0 10*3/uL (ref 0.0–0.2)
Basos: 1 %
EOS (ABSOLUTE): 0.1 10*3/uL (ref 0.0–0.4)
Eos: 1 %
Hematocrit: 31.1 % — ABNORMAL LOW (ref 34.0–46.6)
Hemoglobin: 9.9 g/dL — ABNORMAL LOW (ref 11.1–15.9)
Immature Grans (Abs): 0.1 10*3/uL (ref 0.0–0.1)
Immature Granulocytes: 1 %
Lymphocytes Absolute: 1.1 10*3/uL (ref 0.7–3.1)
Lymphs: 16 %
MCH: 30.5 pg (ref 26.6–33.0)
MCHC: 31.8 g/dL (ref 31.5–35.7)
MCV: 96 fL (ref 79–97)
Monocytes Absolute: 0.9 10*3/uL (ref 0.1–0.9)
Monocytes: 14 %
Neutrophils Absolute: 4.5 10*3/uL (ref 1.4–7.0)
Neutrophils: 67 %
Platelets: 359 10*3/uL (ref 150–450)
RBC: 3.25 x10E6/uL — ABNORMAL LOW (ref 3.77–5.28)
RDW: 12.3 % (ref 11.7–15.4)
WBC: 6.7 10*3/uL (ref 3.4–10.8)

## 2021-01-06 LAB — CMP14+EGFR
ALT: 17 IU/L (ref 0–32)
AST: 13 IU/L (ref 0–40)
Albumin/Globulin Ratio: 1.6 (ref 1.2–2.2)
Albumin: 3.5 g/dL — ABNORMAL LOW (ref 3.7–4.7)
Alkaline Phosphatase: 329 IU/L — ABNORMAL HIGH (ref 44–121)
BUN/Creatinine Ratio: 19 (ref 12–28)
BUN: 18 mg/dL (ref 8–27)
Bilirubin Total: 0.3 mg/dL (ref 0.0–1.2)
CO2: 21 mmol/L (ref 20–29)
Calcium: 10.2 mg/dL (ref 8.7–10.3)
Chloride: 102 mmol/L (ref 96–106)
Creatinine, Ser: 0.97 mg/dL (ref 0.57–1.00)
Globulin, Total: 2.2 g/dL (ref 1.5–4.5)
Glucose: 105 mg/dL — ABNORMAL HIGH (ref 65–99)
Potassium: 4.3 mmol/L (ref 3.5–5.2)
Sodium: 141 mmol/L (ref 134–144)
Total Protein: 5.7 g/dL — ABNORMAL LOW (ref 6.0–8.5)
eGFR: 59 mL/min/{1.73_m2} — ABNORMAL LOW (ref >59)

## 2021-01-06 LAB — MAGNESIUM: Magnesium: 1.7 mg/dL (ref 1.6–2.3)

## 2021-01-06 NOTE — Telephone Encounter (Signed)
HH aware

## 2021-01-06 NOTE — Telephone Encounter (Signed)
Yes. Patient and I discussed this yesterday.

## 2021-01-06 NOTE — Telephone Encounter (Signed)
Patient aware and verbalized understanding. °

## 2021-01-06 NOTE — Telephone Encounter (Signed)
VM from [place] [facility], would like VO to wean pt off of O2 Please advise

## 2021-01-07 DIAGNOSIS — N202 Calculus of kidney with calculus of ureter: Secondary | ICD-10-CM | POA: Diagnosis not present

## 2021-01-07 NOTE — Progress Notes (Signed)
Cardiology Office Note   Date:  01/08/2021   ID:  [name] [name], [name] [name] 1941-01-14, MRN [id number]  PCP:  Loman Brooklyn, FNP  Cardiologist:   None   Chief Complaint  Patient presents with   Atrial Fibrillation       History of Present Illness: [name] [name] is a 80 y.o. female who presents for preop evaluation prior to   Since I last saw her she was in the hospital with septic shock from urinary infection with a ureteral stone.  She is actually going to have a cystoscope and had this removed.  She went into atrial fibrillation during that hospitalization and was managed with rate control.  She been chronically on anticoagulation because of her PE.  I did review this hospitalization for this visit.  She had some anemia.  She had respiratory failure.  She had acute renal insufficiency.  She is still anemic but the rest of her issues have resolved for the most part.  Her renal function has come back to normal.  She was sent home on home O2 but she says her requirements are going down and she is only using this as needed.  She gets around in her house and is working with PT and OT.  She is not having any palpitations that she notices but she is still apparently in atrial fibrillation and it looks like she was in atrial fibrillation when she was discharged.  She is not having any presyncope or syncope.  She does not feel the heart rhythm.  She is not having any chest pressure, neck or arm discomfort.  She has had no weight gain or edema.   Past Medical History:  Diagnosis Date   Anemia    Arthritis    Ostearthritis- hips, knees, fingers   Diabetes mellitus without complication (HCC)    DVT (deep venous thrombosis) (HCC)    Dyspnea    GERD (gastroesophageal reflux disease)    Headache(784.0)    tx. Valproic acid   Hypertension    Hypothyroidism    Pulmonary embolism [facility])     Past Surgical History:  Procedure Laterality Date   ABDOMINAL HYSTERECTOMY     BIOPSY   12/08/2019   Procedure: BIOPSY;  Surgeon: Daneil Dolin, MD;  [facility];  Service: Endoscopy;;   CATARACT EXTRACTION, BILATERAL     COLONOSCOPY N/A 12/08/2019   polyps (tubular adenoma), diverticulosis, colonic lipoma, no surveillance due to age   50 W/ [place] Right 12/25/2020   Procedure: CYSTOSCOPY WITH RETROGRADE PYELOGRAM/URETERAL STENT PLACEMENT;  Surgeon: Janith Lima, MD;  Location[facility];  Service: Urology;  Laterality: Right;   ESOPHAGOGASTRODUODENOSCOPY N/A 12/08/2019   normal esophagus with 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   GIVENS CAPSULE STUDY N/A 01/13/2020   Procedure: GIVENS CAPSULE STUDY;  Surgeon: Daneil Dolin, MD;  [facility];  Service: Endoscopy;  Laterality: N/A;  7:30am   MULTIPLE TOOTH EXTRACTIONS     60's   PARATHYROIDECTOMY     POLYPECTOMY  12/08/2019   Procedure: POLYPECTOMY;  Surgeon: Daneil Dolin, MD;  [facility];  Service: Endoscopy;;   TOTAL HIP ARTHROPLASTY Right 10/29/2013   Procedure: RIGHT TOTAL HIP ARTHROPLASTY ANTERIOR APPROACH;  Surgeon: Mauri Pole, MD;  Location[facility];  Service: Orthopedics;  Laterality: Right;     Current Outpatient Medications  Medication Sig Dispense Refill   acetaminophen (TYLENOL) 325 MG tablet Take  2 tablets (650 mg total) by mouth every 6 (six) hours as needed for mild pain or headache (or Fever >/= 101). 12 tablet 0   allopurinol (ZYLOPRIM) 100 MG tablet TAKE 1 TABLET BY MOUTH  DAILY (Patient taking differently: Take 100 mg by mouth daily.) 90 tablet 0   atorvastatin (LIPITOR) 20 MG tablet Take 1 tablet (20 mg total) by mouth daily. 30 tablet 2   Cholecalciferol (VITAMIN D-3) 5000 UNITS TABS Take 5,000 Units by mouth daily.     diclofenac Sodium (VOLTAREN) 1 % GEL Apply 4 g topically 4 (four) times daily. (Patient taking differently: Apply 4 g topically daily.) 100 g 2   diltiazem (CARTIA XT) 240 MG 24 hr capsule  Take 1 capsule (240 mg total) by mouth daily. 30 capsule 2   EUTHYROX 50 MCG tablet TAKE 1 TABLET BY MOUTH ONCE DAILY BEFORE BREAKFAST (Patient taking differently: Take 50 mcg by mouth daily before breakfast.) 30 tablet 10   ferrous sulfate 325 (65 FE) MG tablet Take 325 mg by mouth daily with breakfast.     furosemide (LASIX) 20 MG tablet TAKE 1 TABLET BY MOUTH  TWICE DAILY (Patient taking differently: Take 20 mg by mouth 2 (two) times daily.) 180 tablet 0   hydrocortisone cream 0.5 % Apply topically 2 (two) times daily. (Patient taking differently: Apply 1 application topically daily.) 30 g 0   loratadine (CLARITIN) 10 MG tablet Take 10 mg by mouth daily.     ONE TOUCH ULTRA TEST test strip USE UP TO FOUR TIMES DAILY AS DIRECTED 400 each 3   oxybutynin (DITROPAN-XL) 10 MG 24 hr tablet Take 1 tablet (10 mg total) by mouth at bedtime. 90 tablet 1   pantoprazole (PROTONIX) 40 MG tablet Take 1 tablet (40 mg total) by mouth 2 (two) times daily before a meal. 180 tablet 1   apixaban (ELIQUIS) 5 MG TABS tablet Take 1 tablet (5 mg total) by mouth 2 (two) times daily. 90 tablet 3   No current facility-administered medications for this visit.    Allergies:   Patient has no known allergies.     ROS:  Please see the history of present illness.   Otherwise, review of systems are positive for none.   All other systems are reviewed and negative.    PHYSICAL EXAM: VS:  BP 122/68 (BP Location: Left Arm, Patient Position: Sitting, Cuff Size: Normal)   Pulse 93   Resp 18   Ht 5\' 3"  (1.6 m)   Wt 208 lb (94.3 kg)   SpO2 97%   BMI 36.85 kg/m  , BMI Body mass index is 36.85 kg/m.  GEN:  No distress NECK:  No jugular venous distention at 90 degrees, waveform within normal limits, carotid upstroke brisk and symmetric, no bruits, no thyromegaly LYMPHATICS:  No cervical adenopathy LUNGS:  Clear to auscultation bilaterally BACK:  No CVA tenderness CHEST:  Unremarkable HEART:  S1 and S2 within normal  limits, no S3, no clicks, no rubs, no murmurs, irregular ABD:  Positive bowel sounds normal in frequency in pitch, no bruits, no rebound, no guarding, unable to assess midline mass or bruit with the patient seated. EXT:  2 plus pulses throughout, no edema, no cyanosis no clubbing SKIN:  No rashes no nodules NEURO:  Cranial nerves II through XII grossly intact, motor grossly intact throughout PSYCH:  Cognitively intact, oriented to person place and time   EKG:  EKG is not ordered today.   Recent Labs: 10/20/2020: TSH 2.850  01/05/2021: ALT 17; BUN 18; Creatinine, Ser 0.97; Hemoglobin 9.9; Magnesium 1.7; Platelets 359; Potassium 4.3; Sodium 141    Lipid Panel    Component Value Date/Time   CHOL 174 10/20/2020 1017   TRIG 193 (H) 12/26/2020 0500   HDL 55 10/20/2020 1017   CHOLHDL 3.2 10/20/2020 1017   LDLCALC 90 10/20/2020 1017      Wt Readings from Last 3 Encounters:  01/08/21 208 lb (94.3 kg)  12/30/20 221 lb 1.6 oz (100.3 kg)  12/22/20 209 lb (94.8 kg)      Other studies Reviewed: Additional studies/ records that were reviewed today include: Extensive review of hospital records. Review of the above records demonstrates:  Please see elsewhere in the note.     ASSESSMENT AND PLAN:    PE:   She was to remain on the lower dose Eliquis because of the unprovoked PE.  However, now that she has atrial fibrillation I will put her back when she restarts to 5 mg twice daily.   HTN:   The blood pressure is controlled.  No change in therapy.    ATRIAL FIB: She had new onset atrial fibrillation that seems to be persistent.  We talked about how she can monitor rate control and she has a pulse oximeter.  She is actually not having any symptoms related to this and I do not see any benefit to cardioversion.  Therefore, no change in therapy.  She will hold the Eliquis for 3 days prior to her procedure.  AKI: Her creatinine improved and I did see the most recent labs on 6/14.    ELEVATED  TROPONIN: This was elevated during hospitalization probably demand ischemia.  She is not having any anginal symptoms.  I would not suggest follow-up.   ANEMIA: She does have a chronic anemia and is on iron supplement.  I will defer to Loman Brooklyn, FNP  PREOP: The patient has no contraindication to the planned cystoscopy.  No further testing is suggested.  She can hold the anticoagulation as above.  Current medicines are reviewed at length with the patient today.  The patient does not have concerns regarding medicines.  The following changes have been made:    As above  Labs/ tests ordered today include: None    No orders of the defined types were placed in this encounter.    Disposition:   FU with me in 6 any Her chart months.     Signed, Minus Breeding, MD  01/08/2021 11:45 AM    [place]

## 2021-01-08 ENCOUNTER — Ambulatory Visit (INDEPENDENT_AMBULATORY_CARE_PROVIDER_SITE_OTHER): Payer: Medicare Other

## 2021-01-08 ENCOUNTER — Ambulatory Visit: Payer: Medicare Other | Admitting: Cardiology

## 2021-01-08 ENCOUNTER — Other Ambulatory Visit: Payer: Self-pay

## 2021-01-08 ENCOUNTER — Encounter: Payer: Self-pay | Admitting: Cardiology

## 2021-01-08 VITALS — BP 122/68 | HR 93 | Resp 18 | Ht 63.0 in | Wt 208.0 lb

## 2021-01-08 DIAGNOSIS — Z01810 Encounter for preprocedural cardiovascular examination: Secondary | ICD-10-CM

## 2021-01-08 DIAGNOSIS — E119 Type 2 diabetes mellitus without complications: Secondary | ICD-10-CM | POA: Diagnosis not present

## 2021-01-08 DIAGNOSIS — M19042 Primary osteoarthritis, left hand: Secondary | ICD-10-CM

## 2021-01-08 DIAGNOSIS — E785 Hyperlipidemia, unspecified: Secondary | ICD-10-CM

## 2021-01-08 DIAGNOSIS — R001 Bradycardia, unspecified: Secondary | ICD-10-CM

## 2021-01-08 DIAGNOSIS — B9620 Unspecified Escherichia coli [E. coli] as the cause of diseases classified elsewhere: Secondary | ICD-10-CM

## 2021-01-08 DIAGNOSIS — D649 Anemia, unspecified: Secondary | ICD-10-CM

## 2021-01-08 DIAGNOSIS — E039 Hypothyroidism, unspecified: Secondary | ICD-10-CM

## 2021-01-08 DIAGNOSIS — J9601 Acute respiratory failure with hypoxia: Secondary | ICD-10-CM | POA: Diagnosis not present

## 2021-01-08 DIAGNOSIS — N136 Pyonephrosis: Secondary | ICD-10-CM | POA: Diagnosis not present

## 2021-01-08 DIAGNOSIS — K219 Gastro-esophageal reflux disease without esophagitis: Secondary | ICD-10-CM

## 2021-01-08 DIAGNOSIS — N179 Acute kidney failure, unspecified: Secondary | ICD-10-CM

## 2021-01-08 DIAGNOSIS — M170 Bilateral primary osteoarthritis of knee: Secondary | ICD-10-CM | POA: Diagnosis not present

## 2021-01-08 DIAGNOSIS — Z86711 Personal history of pulmonary embolism: Secondary | ICD-10-CM

## 2021-01-08 DIAGNOSIS — Z86718 Personal history of other venous thrombosis and embolism: Secondary | ICD-10-CM

## 2021-01-08 DIAGNOSIS — E669 Obesity, unspecified: Secondary | ICD-10-CM

## 2021-01-08 DIAGNOSIS — M109 Gout, unspecified: Secondary | ICD-10-CM

## 2021-01-08 DIAGNOSIS — I480 Paroxysmal atrial fibrillation: Secondary | ICD-10-CM

## 2021-01-08 DIAGNOSIS — I10 Essential (primary) hypertension: Secondary | ICD-10-CM | POA: Diagnosis not present

## 2021-01-08 DIAGNOSIS — M19041 Primary osteoarthritis, right hand: Secondary | ICD-10-CM | POA: Diagnosis not present

## 2021-01-08 DIAGNOSIS — M1612 Unilateral primary osteoarthritis, left hip: Secondary | ICD-10-CM

## 2021-01-08 DIAGNOSIS — Z7901 Long term (current) use of anticoagulants: Secondary | ICD-10-CM

## 2021-01-08 DIAGNOSIS — Z96641 Presence of right artificial hip joint: Secondary | ICD-10-CM

## 2021-01-08 DIAGNOSIS — Z86010 Personal history of colonic polyps: Secondary | ICD-10-CM

## 2021-01-08 DIAGNOSIS — Z6834 Body mass index (BMI) 34.0-34.9, adult: Secondary | ICD-10-CM

## 2021-01-08 DIAGNOSIS — Z960 Presence of urogenital implants: Secondary | ICD-10-CM

## 2021-01-08 MED ORDER — APIXABAN 5 MG PO TABS: 5.0000 mg | ORAL_TABLET | Freq: Two times a day (BID) | ORAL | 3 refills | Status: DC

## 2021-01-08 NOTE — Patient Instructions (Signed)
Medication Instructions:  HOLD your Eliquis for 3 days prior to your cystoscope and the day of. You will restart this medication when the procedure provider tells you to. When you restart, you will INCREASE your Eliquis to 5mg  twice daily.   *If you need a refill on your cardiac medications before your next appointment, please call your pharmacy*   Lab Work: None ordered.   If you have labs (blood work) drawn today and your tests are completely normal, you will receive your results only by: [place] (if you have MyChart) OR A paper copy in the mail If you have any lab test that is abnormal or we need to change your treatment, we will call you to review the results.   Testing/Procedures: None ordered.    Follow-Up: At [facility], you and your health needs are our priority.  As part of our continuing mission to provide you with exceptional heart care, we have created designated Provider Care Teams.  These Care Teams include your primary Cardiologist (physician) and Advanced Practice Providers (APPs -  Physician Assistants and Nurse Practitioners) who all work together to provide you with the care you need, when you need it.  We recommend signing up for the patient portal called "MyChart".  Sign up information is provided on this After Visit Summary.  MyChart is used to connect with patients for Virtual Visits (Telemedicine).  Patients are able to view lab/test results, encounter notes, upcoming appointments, etc.  Non-urgent messages can be sent to your provider as well.   To learn more about what you can do with MyChart, go to [email or web address].    Your next appointment:   6 month(s)  The format for your next appointment:   In Person  Provider:   Dr. Percival Spanish in [place] office.

## 2021-01-11 ENCOUNTER — Encounter (HOSPITAL_COMMUNITY)
Admission: RE | Admit: 2021-01-11 | Discharge: 2021-01-11 | Disposition: A | Discharge status: Home / Self Care | Payer: Medicare Other | Source: Ambulatory Visit | Attending: Urology | Admitting: Urology

## 2021-01-11 ENCOUNTER — Other Ambulatory Visit: Payer: Self-pay

## 2021-01-11 ENCOUNTER — Encounter (HOSPITAL_COMMUNITY): Payer: Self-pay

## 2021-01-11 DIAGNOSIS — I10 Essential (primary) hypertension: Secondary | ICD-10-CM | POA: Insufficient documentation

## 2021-01-11 DIAGNOSIS — E039 Hypothyroidism, unspecified: Secondary | ICD-10-CM | POA: Insufficient documentation

## 2021-01-11 DIAGNOSIS — Z79890 Hormone replacement therapy (postmenopausal): Secondary | ICD-10-CM | POA: Insufficient documentation

## 2021-01-11 DIAGNOSIS — J9601 Acute respiratory failure with hypoxia: Secondary | ICD-10-CM | POA: Diagnosis not present

## 2021-01-11 DIAGNOSIS — K219 Gastro-esophageal reflux disease without esophagitis: Secondary | ICD-10-CM | POA: Insufficient documentation

## 2021-01-11 DIAGNOSIS — Z7901 Long term (current) use of anticoagulants: Secondary | ICD-10-CM | POA: Diagnosis not present

## 2021-01-11 DIAGNOSIS — E785 Hyperlipidemia, unspecified: Secondary | ICD-10-CM | POA: Diagnosis not present

## 2021-01-11 DIAGNOSIS — N201 Calculus of ureter: Secondary | ICD-10-CM | POA: Diagnosis not present

## 2021-01-11 DIAGNOSIS — M170 Bilateral primary osteoarthritis of knee: Secondary | ICD-10-CM | POA: Diagnosis not present

## 2021-01-11 DIAGNOSIS — N179 Acute kidney failure, unspecified: Secondary | ICD-10-CM | POA: Diagnosis not present

## 2021-01-11 DIAGNOSIS — Z01812 Encounter for preprocedural laboratory examination: Secondary | ICD-10-CM | POA: Diagnosis not present

## 2021-01-11 DIAGNOSIS — N136 Pyonephrosis: Secondary | ICD-10-CM | POA: Diagnosis not present

## 2021-01-11 DIAGNOSIS — E119 Type 2 diabetes mellitus without complications: Secondary | ICD-10-CM | POA: Diagnosis not present

## 2021-01-11 DIAGNOSIS — M19041 Primary osteoarthritis, right hand: Secondary | ICD-10-CM | POA: Diagnosis not present

## 2021-01-11 DIAGNOSIS — M1612 Unilateral primary osteoarthritis, left hip: Secondary | ICD-10-CM | POA: Diagnosis not present

## 2021-01-11 DIAGNOSIS — D649 Anemia, unspecified: Secondary | ICD-10-CM | POA: Diagnosis not present

## 2021-01-11 DIAGNOSIS — I82409 Acute embolism and thrombosis of unspecified deep veins of unspecified lower extremity: Secondary | ICD-10-CM | POA: Insufficient documentation

## 2021-01-11 DIAGNOSIS — Z79899 Other long term (current) drug therapy: Secondary | ICD-10-CM | POA: Insufficient documentation

## 2021-01-11 DIAGNOSIS — I480 Paroxysmal atrial fibrillation: Secondary | ICD-10-CM | POA: Diagnosis not present

## 2021-01-11 DIAGNOSIS — M19042 Primary osteoarthritis, left hand: Secondary | ICD-10-CM | POA: Diagnosis not present

## 2021-01-11 HISTORY — DX: Personal history of urinary calculi: Z87.442

## 2021-01-11 LAB — GLUCOSE, CAPILLARY: Glucose-Capillary: 110 mg/dL — ABNORMAL HIGH (ref 70–99)

## 2021-01-13 DIAGNOSIS — I480 Paroxysmal atrial fibrillation: Secondary | ICD-10-CM | POA: Diagnosis not present

## 2021-01-13 DIAGNOSIS — N136 Pyonephrosis: Secondary | ICD-10-CM | POA: Diagnosis not present

## 2021-01-13 DIAGNOSIS — E119 Type 2 diabetes mellitus without complications: Secondary | ICD-10-CM | POA: Diagnosis not present

## 2021-01-13 DIAGNOSIS — N179 Acute kidney failure, unspecified: Secondary | ICD-10-CM | POA: Diagnosis not present

## 2021-01-13 DIAGNOSIS — N202 Calculus of kidney with calculus of ureter: Secondary | ICD-10-CM | POA: Diagnosis not present

## 2021-01-13 DIAGNOSIS — D649 Anemia, unspecified: Secondary | ICD-10-CM | POA: Diagnosis not present

## 2021-01-13 DIAGNOSIS — E785 Hyperlipidemia, unspecified: Secondary | ICD-10-CM | POA: Diagnosis not present

## 2021-01-13 DIAGNOSIS — I10 Essential (primary) hypertension: Secondary | ICD-10-CM | POA: Diagnosis not present

## 2021-01-13 DIAGNOSIS — M1612 Unilateral primary osteoarthritis, left hip: Secondary | ICD-10-CM | POA: Diagnosis not present

## 2021-01-13 DIAGNOSIS — M19041 Primary osteoarthritis, right hand: Secondary | ICD-10-CM | POA: Diagnosis not present

## 2021-01-13 DIAGNOSIS — M19042 Primary osteoarthritis, left hand: Secondary | ICD-10-CM | POA: Diagnosis not present

## 2021-01-13 DIAGNOSIS — M170 Bilateral primary osteoarthritis of knee: Secondary | ICD-10-CM | POA: Diagnosis not present

## 2021-01-13 DIAGNOSIS — J9601 Acute respiratory failure with hypoxia: Secondary | ICD-10-CM | POA: Diagnosis not present

## 2021-01-13 NOTE — Progress Notes (Signed)
Anesthesia Chart Review   Case: [id number] Date/Time: 01/15/21 1430   Procedure: CYSTOSCOPY/RETROGRADE/URETEROSCOPY/HOLMIUM LASER/STENT PLACEMENT (Right)   Anesthesia type: General   Pre-op diagnosis: RIGHT URETERAL STONE   [facility]   Surgeons: Janith Lima, MD       DISCUSSION:80 y.o. never smoker with h/o HTN, GERD, DM II, DVT, hypothyroidism, right ureteral stone scheduled for above procedure 01/15/2021 with Dr. Rexene Alberts.   Seen by cardiology 04/10/2021. Per OV note, "The patient has no contraindication to the planned cystoscopy.  No further testing is suggested.  She can hold the anticoagulation as above."  Advised to hold Eliquis 3 days prior to procedure."  Anticipate pt can proceed with planned procedure barring acute status change.   VS: BP (!) 145/85   Pulse 92   Temp 37.2 C   Resp 16   Ht 5\' 3"  (1.6 m)   Wt 92.4 kg   SpO2 99%   BMI 36.10 kg/m   PROVIDERS: Loman Brooklyn, FNP is PCP   Minus Breeding, MD is Cardiologist  LABS: Labs reviewed: Acceptable for surgery. (all labs ordered are listed, but only abnormal results are displayed)  Labs Reviewed  GLUCOSE, CAPILLARY - Abnormal; Notable for the following components:      Result Value   Glucose-Capillary 110 (*)    All other components within normal limits     IMAGES:   EKG: 12/25/20 Rate 90 bpm  Normal sinus rhythm with sinus arrhythmia Cannot rule out anterior inarct, age undetermined.   CV: Echo 04/03/2019  1. The left ventricle has normal systolic function with an ejection  fraction of 60-65%. The cavity size was normal. There is mildly increased  left ventricular wall thickness. Left ventricular diastolic Doppler  parameters are consistent with impaired  relaxation. Elevated mean left atrial pressure.   2. The right ventricle has normal systolic function. The cavity was  normal. There is no increase in right ventricular wall thickness.   3. Left atrial size was severely  dilated.   4. Right atrial size was mildly dilated.   5. No evidence of mitral valve stenosis.   6. The aortic valve is tricuspid. No stenosis of the aortic valve.   7. The aorta is normal unless otherwise noted.   8. The aortic root is normal in size and structure.   9. Pulmonary hypertension is indeterminant, inadequate TR jet.  Past Medical History:  Diagnosis Date   Anemia    Arthritis    Ostearthritis- hips, knees, fingers   Diabetes mellitus without complication (HCC)    DVT (deep venous thrombosis) (HCC)    Dyspnea    GERD (gastroesophageal reflux disease)    Headache(784.0)    tx. Valproic acid   History of kidney stones    Hypertension    Hypothyroidism    Pulmonary embolism [facility])     Past Surgical History:  Procedure Laterality Date   ABDOMINAL HYSTERECTOMY     BILATERAL HIP ARTHROSCOPY Left    BIOPSY  12/08/2019   Procedure: BIOPSY;  Surgeon: Daneil Dolin, MD;  [facility];  Service: Endoscopy;;   CATARACT EXTRACTION, BILATERAL     COLONOSCOPY N/A 12/08/2019   polyps (tubular adenoma), diverticulosis, colonic lipoma, no surveillance due to age   [address] W/ [place] Right 12/25/2020   Procedure: CYSTOSCOPY WITH RETROGRADE PYELOGRAM/URETERAL STENT PLACEMENT;  Surgeon: Janith Lima, MD;  [facility];  Service: Urology;  Laterality: Right;   ESOPHAGOGASTRODUODENOSCOPY N/A  12/08/2019   normal esophagus with 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   GIVENS CAPSULE STUDY N/A 01/13/2020   Procedure: GIVENS CAPSULE STUDY;  Surgeon: Daneil Dolin, MD;  [facility];  Service: Endoscopy;  Laterality: N/A;  7:30am   MULTIPLE TOOTH EXTRACTIONS     60's   PARATHYROIDECTOMY     POLYPECTOMY  12/08/2019   Procedure: POLYPECTOMY;  Surgeon: Daneil Dolin, MD;  [facility];  Service: Endoscopy;;   TOTAL HIP ARTHROPLASTY Right 10/29/2013   Procedure: RIGHT TOTAL HIP ARTHROPLASTY  ANTERIOR APPROACH;  Surgeon: Mauri Pole, MD;  [facility];  Service: Orthopedics;  Laterality: Right;    MEDICATIONS:  acetaminophen (TYLENOL) 325 MG tablet   allopurinol (ZYLOPRIM) 100 MG tablet   apixaban (ELIQUIS) 5 MG TABS tablet   atorvastatin (LIPITOR) 20 MG tablet   Cholecalciferol (VITAMIN D-3) 5000 UNITS TABS   diclofenac Sodium (VOLTAREN) 1 % GEL   diltiazem (CARTIA XT) 240 MG 24 hr capsule   EUTHYROX 50 MCG tablet   ferrous sulfate 325 (65 FE) MG tablet   furosemide (LASIX) 20 MG tablet   hydrocortisone cream 0.5 %   loratadine (CLARITIN) 10 MG tablet   ONE TOUCH ULTRA TEST test strip   oxybutynin (DITROPAN-XL) 10 MG 24 hr tablet   pantoprazole (PROTONIX) 40 MG tablet   No current facility-administered medications for this encounter.    Konrad Felix, PA-C [facility] [phone]

## 2021-01-14 DIAGNOSIS — E785 Hyperlipidemia, unspecified: Secondary | ICD-10-CM | POA: Diagnosis not present

## 2021-01-14 DIAGNOSIS — M1612 Unilateral primary osteoarthritis, left hip: Secondary | ICD-10-CM | POA: Diagnosis not present

## 2021-01-14 DIAGNOSIS — J9601 Acute respiratory failure with hypoxia: Secondary | ICD-10-CM | POA: Diagnosis not present

## 2021-01-14 DIAGNOSIS — M170 Bilateral primary osteoarthritis of knee: Secondary | ICD-10-CM | POA: Diagnosis not present

## 2021-01-14 DIAGNOSIS — N136 Pyonephrosis: Secondary | ICD-10-CM | POA: Diagnosis not present

## 2021-01-14 DIAGNOSIS — I10 Essential (primary) hypertension: Secondary | ICD-10-CM | POA: Diagnosis not present

## 2021-01-14 DIAGNOSIS — E119 Type 2 diabetes mellitus without complications: Secondary | ICD-10-CM | POA: Diagnosis not present

## 2021-01-14 DIAGNOSIS — M19042 Primary osteoarthritis, left hand: Secondary | ICD-10-CM | POA: Diagnosis not present

## 2021-01-14 DIAGNOSIS — N179 Acute kidney failure, unspecified: Secondary | ICD-10-CM | POA: Diagnosis not present

## 2021-01-14 DIAGNOSIS — I480 Paroxysmal atrial fibrillation: Secondary | ICD-10-CM | POA: Diagnosis not present

## 2021-01-14 DIAGNOSIS — D649 Anemia, unspecified: Secondary | ICD-10-CM | POA: Diagnosis not present

## 2021-01-14 DIAGNOSIS — M19041 Primary osteoarthritis, right hand: Secondary | ICD-10-CM | POA: Diagnosis not present

## 2021-01-14 MED ORDER — GENTAMICIN SULFATE 40 MG/ML IJ SOLN
5.0000 mg/kg | INTRAVENOUS | Status: AC
  Administered 2021-01-15: 340 mg via INTRAVENOUS
  Filled 2021-01-14: qty 8.5

## 2021-01-15 ENCOUNTER — Encounter (HOSPITAL_COMMUNITY): Payer: Self-pay | Admitting: Urology

## 2021-01-15 ENCOUNTER — Ambulatory Visit (HOSPITAL_COMMUNITY): Payer: Medicare Other

## 2021-01-15 ENCOUNTER — Ambulatory Visit (HOSPITAL_COMMUNITY)
Admission: RE | Admit: 2021-01-15 | Discharge: 2021-01-15 | Disposition: A | Discharge status: Home / Self Care | Payer: Medicare Other | Attending: Urology | Admitting: Urology

## 2021-01-15 ENCOUNTER — Ambulatory Visit (HOSPITAL_COMMUNITY): Payer: Medicare Other | Admitting: Certified Registered Nurse Anesthetist

## 2021-01-15 ENCOUNTER — Ambulatory Visit (HOSPITAL_COMMUNITY): Payer: Medicare Other | Admitting: Physician Assistant

## 2021-01-15 ENCOUNTER — Encounter (HOSPITAL_COMMUNITY)
Admission: RE | Disposition: A | Discharge status: Home / Self Care | Payer: Self-pay | Source: Home / Self Care | Attending: Urology

## 2021-01-15 DIAGNOSIS — I10 Essential (primary) hypertension: Secondary | ICD-10-CM | POA: Diagnosis not present

## 2021-01-15 DIAGNOSIS — Z96641 Presence of right artificial hip joint: Secondary | ICD-10-CM | POA: Insufficient documentation

## 2021-01-15 DIAGNOSIS — N201 Calculus of ureter: Secondary | ICD-10-CM | POA: Diagnosis not present

## 2021-01-15 DIAGNOSIS — N132 Hydronephrosis with renal and ureteral calculous obstruction: Secondary | ICD-10-CM | POA: Diagnosis not present

## 2021-01-15 DIAGNOSIS — Z86711 Personal history of pulmonary embolism: Secondary | ICD-10-CM | POA: Diagnosis not present

## 2021-01-15 DIAGNOSIS — Z86718 Personal history of other venous thrombosis and embolism: Secondary | ICD-10-CM | POA: Insufficient documentation

## 2021-01-15 DIAGNOSIS — Z90710 Acquired absence of both cervix and uterus: Secondary | ICD-10-CM | POA: Insufficient documentation

## 2021-01-15 DIAGNOSIS — N202 Calculus of kidney with calculus of ureter: Secondary | ICD-10-CM | POA: Diagnosis not present

## 2021-01-15 DIAGNOSIS — D500 Iron deficiency anemia secondary to blood loss (chronic): Secondary | ICD-10-CM | POA: Diagnosis not present

## 2021-01-15 DIAGNOSIS — K219 Gastro-esophageal reflux disease without esophagitis: Secondary | ICD-10-CM | POA: Diagnosis not present

## 2021-01-15 DIAGNOSIS — Z832 Family history of diseases of the blood and blood-forming organs and certain disorders involving the immune mechanism: Secondary | ICD-10-CM | POA: Insufficient documentation

## 2021-01-15 HISTORY — PX: CYSTOSCOPY/URETEROSCOPY/HOLMIUM LASER/STENT PLACEMENT: SHX6546

## 2021-01-15 LAB — GLUCOSE, CAPILLARY: Glucose-Capillary: 101 mg/dL — ABNORMAL HIGH (ref 70–99)

## 2021-01-15 SURGERY — CYSTOSCOPY/URETEROSCOPY/HOLMIUM LASER/STENT PLACEMENT
Anesthesia: General | Laterality: Right

## 2021-01-15 MED ORDER — METOPROLOL TARTRATE 5 MG/5ML IV SOLN
INTRAVENOUS | Status: DC | PRN
  Administered 2021-01-15: 2 mg via INTRAVENOUS

## 2021-01-15 MED ORDER — PROMETHAZINE HCL 25 MG/ML IJ SOLN: 6.2500 mg | INTRAMUSCULAR | Status: DC | PRN

## 2021-01-15 MED ORDER — LACTATED RINGERS IV SOLN: INTRAVENOUS | Status: DC

## 2021-01-15 MED ORDER — OXYCODONE HCL 5 MG PO TABS: 5.0000 mg | ORAL_TABLET | Freq: Once | ORAL | Status: DC | PRN

## 2021-01-15 MED ORDER — AMISULPRIDE (ANTIEMETIC) 5 MG/2ML IV SOLN: 10.0000 mg | Freq: Once | INTRAVENOUS | Status: DC | PRN

## 2021-01-15 MED ORDER — FENTANYL CITRATE (PF) 100 MCG/2ML IJ SOLN
INTRAMUSCULAR | Status: AC
  Filled 2021-01-15: qty 2

## 2021-01-15 MED ORDER — ACETAMINOPHEN 160 MG/5ML PO SOLN: 325.0000 mg | ORAL | Status: DC | PRN

## 2021-01-15 MED ORDER — SODIUM CHLORIDE 0.9 % IR SOLN
Status: DC | PRN
  Administered 2021-01-15: 3000 mL

## 2021-01-15 MED ORDER — ACETAMINOPHEN 325 MG PO TABS: 325.0000 mg | ORAL_TABLET | ORAL | Status: DC | PRN

## 2021-01-15 MED ORDER — LIDOCAINE 2% (20 MG/ML) 5 ML SYRINGE
INTRAMUSCULAR | Status: AC
  Filled 2021-01-15: qty 5

## 2021-01-15 MED ORDER — ONDANSETRON HCL 4 MG/2ML IJ SOLN
INTRAMUSCULAR | Status: AC
  Filled 2021-01-15: qty 2

## 2021-01-15 MED ORDER — OXYCODONE-ACETAMINOPHEN 5-325 MG PO TABS: 1.0000 | ORAL_TABLET | ORAL | 0 refills | Status: DC | PRN

## 2021-01-15 MED ORDER — ESMOLOL HCL 100 MG/10ML IV SOLN
INTRAVENOUS | Status: DC | PRN
  Administered 2021-01-15 (×3): 15 mg via INTRAVENOUS

## 2021-01-15 MED ORDER — ORAL CARE MOUTH RINSE: 15.0000 mL | Freq: Once | OROMUCOSAL | Status: AC

## 2021-01-15 MED ORDER — ONDANSETRON HCL 4 MG/2ML IJ SOLN
INTRAMUSCULAR | Status: DC | PRN
  Administered 2021-01-15: 4 mg via INTRAVENOUS

## 2021-01-15 MED ORDER — CHLORHEXIDINE GLUCONATE 0.12 % MT SOLN
15.0000 mL | Freq: Once | OROMUCOSAL | Status: AC
  Administered 2021-01-15: 15 mL via OROMUCOSAL

## 2021-01-15 MED ORDER — OXYCODONE HCL 5 MG/5ML PO SOLN: 5.0000 mg | Freq: Once | ORAL | Status: DC | PRN

## 2021-01-15 MED ORDER — ACETAMINOPHEN 10 MG/ML IV SOLN: 1000.0000 mg | Freq: Once | INTRAVENOUS | Status: DC | PRN

## 2021-01-15 MED ORDER — DOCUSATE SODIUM 100 MG PO CAPS: 100.0000 mg | ORAL_CAPSULE | Freq: Every day | ORAL | 0 refills | Status: DC | PRN

## 2021-01-15 MED ORDER — IOHEXOL 300 MG/ML  SOLN
INTRAMUSCULAR | Status: DC | PRN
  Administered 2021-01-15: 10 mL

## 2021-01-15 MED ORDER — PROPOFOL 10 MG/ML IV BOLUS
INTRAVENOUS | Status: AC
  Filled 2021-01-15: qty 20

## 2021-01-15 MED ORDER — ESMOLOL HCL 100 MG/10ML IV SOLN
INTRAVENOUS | Status: AC
  Filled 2021-01-15: qty 10

## 2021-01-15 MED ORDER — FENTANYL CITRATE (PF) 100 MCG/2ML IJ SOLN: 25.0000 ug | INTRAMUSCULAR | Status: DC | PRN

## 2021-01-15 MED ORDER — CEPHALEXIN 500 MG PO CAPS: 500.0000 mg | ORAL_CAPSULE | Freq: Two times a day (BID) | ORAL | 0 refills | Status: AC

## 2021-01-15 MED ORDER — FENTANYL CITRATE (PF) 100 MCG/2ML IJ SOLN
INTRAMUSCULAR | Status: DC | PRN
  Administered 2021-01-15 (×2): 50 ug via INTRAVENOUS

## 2021-01-15 SURGICAL SUPPLY — 22 items
BAG URO CATCHER STRL LF (MISCELLANEOUS) ×2 IMPLANT
BASKET ZERO TIP NITINOL 2.4FR (BASKET) ×2 IMPLANT
BSKT STON RTRVL ZERO TP 2.4FR (BASKET) ×1
CATH URET 5FR 28IN OPEN ENDED (CATHETERS) ×2 IMPLANT
CLOTH BEACON ORANGE TIMEOUT ST (SAFETY) ×2 IMPLANT
FIBER LASER MOSES 200 DFL (Laser) IMPLANT
GLOVE SURG ENC TEXT LTX SZ7 (GLOVE) ×2 IMPLANT
GOWN STRL REUS W/TWL LRG LVL3 (GOWN DISPOSABLE) ×4 IMPLANT
GUIDEWIRE STR DUAL SENSOR (WIRE) ×4 IMPLANT
GUIDEWIRE ZIPWRE .038 STRAIGHT (WIRE) IMPLANT
IV NS 1000ML (IV SOLUTION)
IV NS 1000ML BAXH (IV SOLUTION) IMPLANT
KIT TURNOVER KIT A (KITS) ×2 IMPLANT
LASER FIB FLEXIVA PULSE ID 365 (Laser) IMPLANT
MANIFOLD NEPTUNE II (INSTRUMENTS) ×2 IMPLANT
PACK CYSTO (CUSTOM PROCEDURE TRAY) ×2 IMPLANT
SHEATH URETERAL 12FRX35CM (MISCELLANEOUS) ×2 IMPLANT
STENT URET 6FRX26 CONTOUR (STENTS) ×2 IMPLANT
TRACTIP FLEXIVA PULS ID 200XHI (Laser) ×1 IMPLANT
TRACTIP FLEXIVA PULSE ID 200 (Laser) ×2
TUBING CONNECTING 10 (TUBING) ×2 IMPLANT
TUBING UROLOGY SET (TUBING) ×2 IMPLANT

## 2021-01-15 NOTE — Transfer of Care (Signed)
Immediate Anesthesia Transfer of Care Note  Patient: [name] [name]  Procedure(s) Performed: CYSTOSCOPY/RETROGRADE/URETEROSCOPY/HOLMIUM LASER/STENT EXCHANGE (Right)  Patient Location: PACU  Anesthesia Type:General  Level of Consciousness: sedated, patient cooperative and responds to stimulation  Airway & Oxygen Therapy: Patient Spontanous Breathing and Patient connected to face mask oxygen  Post-op Assessment: Report given to RN and Post -op Vital signs reviewed and stable  Post vital signs: Reviewed and stable  Last Vitals:  Vitals Value Taken Time  BP    Temp    Pulse    Resp    SpO2      Last Pain:  Vitals:   01/15/21 1313  TempSrc: Oral  PainSc:          Complications: No notable events documented.

## 2021-01-15 NOTE — Discharge Instructions (Addendum)
[facility] [phone] Post Ureteroscopy With or Without Stent Instructions  Definitions:  Ureter: The duct that transports urine from the kidney to the bladder. Stent:   A plastic hollow tube that is placed into the ureter, from the kidney to the bladder to prevent the ureter from swelling shut.  GENERAL INSTRUCTIONS:  Despite the fact that no skin incisions were used, the area around the ureter and bladder is raw and irritated. The stent is a foreign body which will further irritate the bladder wall. This irritation is manifested by increased frequency of urination, both day and night, and by an increase in the urge to urinate. In some, the urge to urinate is present almost always. Sometimes the urge is strong enough that you may not be able to stop yourself from urinating. The only real cure is to remove the stent and then give time for the bladder wall to heal which can't be done until the danger of the ureter swelling shut has passed, which varies.  You may see some blood in your urine while the stent is in place and a few days afterwards. Do not be alarmed, even if the urine was clear for a while. Get off your feet and drink lots of fluids until clearing occurs. If you start to pass clots or don't improve, call us.  DIET: You may return to your normal diet immediately. Because of the raw surface of your bladder, alcohol, spicy foods, acid type foods and drinks with caffeine may cause irritation or frequency and should be used in moderation. To keep your urine flowing freely and to avoid constipation, drink plenty of fluids during the day ( 8-10 glasses ). Tip: Avoid cranberry juice because it is very acidic.  ACTIVITY: Your physical activity doesn't need to be restricted. However, if you are very active, you may see some blood in your urine. We suggest that you reduce your activity under these circumstances until the bleeding has stopped.  BOWELS: It is important to  keep your bowels regular during the postoperative period. Straining with bowel movements can cause bleeding. A bowel movement every other day is reasonable. Use a mild laxative if needed, such as Milk of Magnesia 2-3 tablespoons, or 2 Dulcolax tablets. Call if you continue to have problems. If you have been taking narcotics for pain, before, during or after your surgery, you may be constipated. Take a laxative if necessary.   MEDICATION: You should resume your pre-surgery medications unless told not to. In addition you will often be given an antibiotic to prevent infection. These should be taken as prescribed until the bottles are finished unless you are having an unusual reaction to one of the drugs.  PROBLEMS YOU SHOULD REPORT TO [place]: Fevers over 100.5 Fahrenheit. Heavy bleeding, or clots ( See above notes about blood in urine ). Inability to urinate. Drug reactions ( hives, rash, nausea, vomiting, diarrhea ). Severe burning or pain with urination that is not improving.  FOLLOW-UP: You will need a follow-up appointment to monitor your progress. Call for this appointment at the number listed above. Usually the first appointment will be about three to fourteen days after your surgery.  You have a right ureteral stent in place. This will be removed in the office in followup.

## 2021-01-15 NOTE — OR Nursing (Signed)
Stone taken by Dr. Gay. ?

## 2021-01-15 NOTE — Anesthesia Procedure Notes (Signed)
Procedure Name: LMA Insertion Date/Time: 01/15/2021 2:56 PM Performed by: Gean Maidens, CRNA Pre-anesthesia Checklist: Patient identified, Emergency Drugs available, Suction available, Patient being monitored and Timeout performed Patient Re-evaluated:Patient Re-evaluated prior to induction Oxygen Delivery Method: Circle system utilized Preoxygenation: Pre-oxygenation with 100% oxygen Induction Type: IV induction Ventilation: Mask ventilation without difficulty LMA: LMA inserted LMA Size: 4.0 Number of attempts: 1 Placement Confirmation: positive ETCO2 and breath sounds checked- equal and bilateral Tube secured with: Tape Dental Injury: Teeth and Oropharynx as per pre-operative assessment

## 2021-01-15 NOTE — Op Note (Signed)
Operative Note  Preoperative diagnosis:  1.  Right ureteral and renal stone  Postoperative diagnosis: 1.  Right ureteral and renal stone  Procedure(s): 1.  Cystoscopy 2. Right ureteroscopy with laser lithotripsy and basket extraction of stones 3. Right retrograde pyelogram 4. Right ureteral stent exchange 5. Fluoroscopy with intraoperative interpretation  Surgeon: Rexene Alberts, MD  Assistants:  None  Anesthesia:  General  Complications:  None  EBL:  Minimal  Specimens: 1. Stones for stone analysis (to be done at [facility])  Drains/Catheters: 1.  Right 6Fr x 26cm ureteral stent WITHOUT a tether string  Intraoperative findings:   Cystoscopy demonstrated normal urothelium without evidence of masses. Right ureteroscopy demonstrated 7 mm right lower pole stone as well as a 3 mm right lower pole stone.  These were fragmented and basket extracted.  No stone fragments were identified at the end of the case. Right retrograde pyelogram no hydronephrosis Successful right ureteral stent exchange  Indication:  [name] [name] is a 80 y.o. female with a history of an obstructing right ureteral stone causing sepsis. CT A/P 12/25/2018 demonstrating 10 mm x 7 mm right proximal ureteral stone. She also had a 64mm right lower pole stone. She underwent right ureteral stent placement on 12/25/2020. She is here today for definitive treatment of her stones.  Description of procedure: After informed consent was obtained from the patient, the patient was identified and taken to the operating room and placed in the supine position.  General anesthesia was administered as well as perioperative IV antibiotics.  At the beginning of the case, a time-out was performed to properly identify the patient, the surgery to be performed, and the surgical site.  Sequential compression devices were applied to the lower extremities at the beginning of the case for DVT prophylaxis.  The patient was then placed in  the dorsal lithotomy supine position, prepped and draped in sterile fashion.  Preliminary scout fluoroscopy revealed that there was a 7 mm mm calcification area at the renal pelvis, which corresponds to the stone found on the preoperative CT scan. We then passed the 21-French rigid cystoscope through the urethra and into the bladder under vision without any difficulty, noting a normal urethra.  A systematic evaluation of the bladder revealed no evidence of any suspicious bladder lesions.  Ureteral orifices were in normal position.    The distal aspect of the ureteral stent was seen protruding from the right ureteral orifice.  We then used the alligator-tooth forceps and grasped the distal end of the ureteral stent and brought it out the urethral meatus while watching the proximal coil straighten out nicely on fluoroscopy. Through the ureteral stent, we then passed a 0.038 sensor wire up to the level of the renal pelvis.  The ureteral stent was then removed, leaving the sensor wire up the right ureter.    A semi-rigid ureteroscope was passed alongside the wire up the distal ureter which appeared normal. A second 0.038 sensor wire was passed under direct vision and the semirigid scope was removed.  A 12/14 ureteral access sheath was carefully advanced up the ureter to the level of the UPJ over this wire under fluoroscopic guidance. The flexible ureteroscope was advanced into the collecting system via the access sheath. The collecting system was inspected. The calculus was identified at the right lower pole. Using the 200 micron holmium laser fiber, the stone was fragmented completely. A 2.2 Fr zero tip basket was used to remove the fragments under visual guidance.  I was  also able to remove the 3 mm separate renal stone fragment.  These were sent for chemical analysis. With the ureteroscope in the kidney, a gentle pyelogram was performed to delineate the calyceal system and we evaluated the calyces  systematically. We encountered a no further stones. The rest of the stone fragments were very tiny and these were  irrigated away gently. The calyces were re-inspected and there were no significant stone fragment residual.   We then withdrew the ureteroscope back down the ureter along with the access sheath, noting no evidence of any stones along the course of the ureter. Once the ureteroscope was removed, the Glidewire was backloaded through the rigid cystoscope, which was then advanced down the urethra and into the bladder. We then used the Glidewire under direct vision through the rigid cystoscope and under fluoroscopic guidance and passed up a 6-French, 26 cm double-pigtail ureteral stent up ureter, making sure that the proximal and distal ends coiled within the kidney and bladder respectively.  The patient tolerated the procedure well and there was no complication. Patient was awoken from anesthesia and taken to the recovery room in stable condition. I was present and scrubbed for the entirety of the case.  Plan:  Patient will be discharged home.  Follow up with me in 7 to 10 days for stent removal in the office.  Matt R. [facility]  Pager: [phone]

## 2021-01-15 NOTE — H&P (Signed)
Office Visit Report     01/07/2021   --------------------------------------------------------------------------------   [name] [name]. Terhaar  MRN: [id number]  DOB: Dec 02, 1940, 80 year old Female  SSN:    PRIMARY CARE:    REFERRING:    PROVIDER:  Rexene Alberts, M.D.  LOCATION:  [facility] - [phone]     --------------------------------------------------------------------------------   CC/HPI: [name] [name] is a 80 year old female seen in follow-up with a history of a right ureteral stone.   She presented to the ED on 12/25/2020 with CT A/P 12/24/2020 demonstrating mm x 7 mm right proximal ureteral stone with signs of AKI and sepsis. Also seen was an approximately 3 mm right lower pole renal stone and punctate left renal stones. She underwent urgent right ureteral stent placement on 12/25/2020. Her creatinine on consultation was 4.2 from a baseline of 1. Her creatinine down trended and was 1.01 on 12/30/2020. Urine culture resulted 50,000 and E. coli pansensitive. She was treated with ceftriaxone and cephalexin in the hospital and was discharged home on cephalexin. She denies fevers or chills. She denies significant abdominal pain or flank pain. She denies fevers, chills or dysuria.   She does have newly diagnosed atrial fibrillation and has cardiology follow-up tomorrow to ensure that she has cardiac clearance prior to the surgery. She is on 2 L of oxygen however is saturating well with 2 L and has no shortness of breath. She has been weaning off of her oxygen.     ALLERGIES: nkda    MEDICATIONS: None   GU PSH: Hysterectomy - 1989       PSH Notes: Parathyroid Surg - 1990   NON-GU PSH: Hip Replacement, Bilateral     GU PMH: None   NON-GU PMH: Arrhythmia Arthritis Atrial Fibrillation Diabetes Type 2 DVT, History GERD Gout Hypercholesterolemia Hypertension Hypothyroidism Pulmonary Embolism, History    FAMILY HISTORY: None   SOCIAL HISTORY: None   REVIEW OF  SYSTEMS:    GU Review Female:   Patient denies get up at night to urinate, trouble starting your stream, have to strain to urinate, being pregnant, stream starts and stops, frequent urination, burning /pain with urination, hard to postpone urination, and leakage of urine.  Gastrointestinal (Upper):   Patient denies nausea, vomiting, and indigestion/ heartburn.  Gastrointestinal (Lower):   Patient denies diarrhea and constipation.  Constitutional:   Patient denies fever, night sweats, weight loss, and fatigue.  Skin:   Patient denies skin rash/ lesion and itching.  Eyes:   Patient denies blurred vision and double vision.  Ears/ Nose/ Throat:   Patient denies sore throat and sinus problems.  Hematologic/Lymphatic:   Patient denies swollen glands and easy bruising.  Cardiovascular:   Patient denies leg swelling and chest pains.  Respiratory:   Patient denies cough and shortness of breath.  Endocrine:   Patient denies excessive thirst.  Musculoskeletal:   Patient denies back pain and joint pain.  Neurological:   Patient denies headaches and dizziness.  Psychologic:   Patient denies depression and anxiety.   VITAL SIGNS:      01/07/2021 09:57 AM  Weight 208 lb / 94.35 kg  Height 63 in / 160.02 cm  BP 122/76 mmHg  Pulse 99 /min  Temperature 98.2 F / 36.7 C  BMI 36.8 kg/m   MULTI-SYSTEM PHYSICAL EXAMINATION:    Constitutional: Well-nourished. No physical deformities. Normally developed. Good grooming.  Respiratory: No labored breathing, no use of accessory muscles.   Cardiovascular: Normal temperature, normal extremity pulses, no swelling, no varicosities.  Gastrointestinal: No mass, no tenderness, no rigidity, obese     Complexity of Data:  Source Of History:  Patient, Medical Record Summary  Records Review:   Previous Doctor Records, Previous Hospital Records, Previous Patient Records  Urine Test Review:   Urinalysis  X-Ray Review: C.T. Abdomen/Pelvis: Reviewed Films. Reviewed Report.  Discussed With Patient.    Notes:                     CLINICAL DATA: Abdomen pain with fever   EXAM:  CT ABDOMEN AND PELVIS WITHOUT CONTRAST   TECHNIQUE:  Multidetector CT imaging of the abdomen and pelvis was performed  following the standard protocol without IV contrast.   COMPARISON: CT 01/22/2020   FINDINGS:  Lower chest: Lung bases demonstrate streaky atelectasis.  Cardiomegaly.   Hepatobiliary: Slightly distended gallbladder. No calcified stone.  No focal hepatic abnormality or biliary dilatation.   Pancreas: Unremarkable. No pancreatic ductal dilatation or  surrounding inflammatory changes.   Spleen: Normal in size without focal abnormality.   Adrenals/Urinary Tract: Adrenal glands are normal. Multiple  low-density lesions in the kidneys consistent with cysts. Moderate  right perinephric fat stranding. Mild right hydronephrosis,  secondary to a 7 x 10 mm stone in the proximal right ureter just  past the UPJ. Bladder is unremarkable. There are bilateral  intrarenal stones. Small cortical hyperdensity within the lower pole  left kidney too small to further characterize.   Stomach/Bowel: The stomach is nonenlarged. No dilated small bowel.  Negative appendix. Diverticular disease of the sigmoid colon.   Vascular/Lymphatic: Moderate aortic atherosclerosis. No aneurysm. No  suspicious nodes.   Reproductive: Status post hysterectomy. No adnexal masses.   Other: Negative for free air. Trace fluid within the bilateral colic  gutters. Right retroperitoneal edema and small fluid presumably due  to obstructive kidney disease.   Musculoskeletal: Bilateral hip replacements. Grade 1 anterolisthesis  L3 on L4 and L4 on L5. Diffuse degenerative changes. No acute  osseous abnormality   IMPRESSION:  1. Mild right hydronephrosis, secondary to a 7 x 10 mm stone in the  proximal right ureter just distal to the UPJ.  2. There are intrarenal stones bilaterally  3. There is  gallbladder distension without inflammatory change or  calcified stone.    Electronically Signed  By: Donavan Foil M.D.  On: 12/24/2020 19:49   PROCEDURES:          Urinalysis w/Scope - 81001 Dipstick Dipstick Cont'd Micro  Color: Yellow Bilirubin: Neg WBC/hpf: 10 - 20/hpf  Appearance: Cloudy Ketones: Neg RBC/hpf: NS (Not Seen)  Specific Gravity: 1.020 Blood: Neg Bacteria: Mod (26-50/hpf)  pH: 5.5 Protein: 1+ Cystals: NS (Not Seen)  Glucose: Neg Urobilinogen: 0.2 Casts: NS (Not Seen)    Nitrites: Neg Trichomonas: Not Present    Leukocyte Esterase: 1+ Mucous: Not Present      Epithelial Cells: 6 - 10/hpf      Yeast: Few (1 - 5/hpf)      Sperm: Not Present    Notes:      ASSESSMENT:      ICD-10 Details  1 GU:   Renal and ureteral calculus - N20.2    PLAN:            Medications New Meds: Ampicillin Trihydrate 500 mg capsule 1 capsule PO Q 6 H   #28  0 Refill(s)            Orders Labs CULTURE, URINE  Document Letter(s):  Created for Patient: Clinical Summary         Notes:   #1. Right ureteral and renal stone:  -CT A/P 12/24/2020 demonstrating mm x 7 mm right proximal ureteral stone and 3 mm right lower pole stone  -S/p right ureteral stent placement on 12/25/2020  -We will send urine for culture today.  -She is tentatively scheduled for right ureteroscopy with laser lithotripsy on 01/15/2021.  -She has cardiology appt tomorrow for cardiac clearance   We discussed the options for management of kidney stones, including observation, ESWL, ureteroscopy with laser lithotripsy, and PCNL. The risks and benefits of each option were discussed.  For observation I described the risks which include but are not limited to silent renal damage, life-threatening infection, need for emergent surgery, failure to pass stone, and pain.   ESWL: risks and benefits of ESWL were outlined including infection, bleeding, pain, steinstrasse, kidney injury, need for ancillary  treatments, and global anesthesia risks including but not limited to CVA, MI, DVT, PE, pneumonia, and death.   Ureteroscopy: risks and benefits of ureteroscopy were outlined, including infection, bleeding, pain, temporary ureteral stent and associated stent bother, ureteral injury, ureteral stricture, need for ancillary treatments, and global anesthesia risks including but not limited to CVA, MI, DVT, PE, pneumonia, and death.   PCNL: risks and benefits of PCNL were outlined including infection, bleeding, blood transfusion, pain, pneumothorax, bowel injury, persistent urine leak, positioning injury, inability to clear stone burden, renal laceration, arterial venous fistula or malformation, need for ancillary treatments, and global anesthesia risks including but not limited to CVA, MI, DVT, PE, pneumonia, and death.   We discussed dietary methods for stone prevention including the following: increased water intake to 2-3 liters per day, add lemon or lemon concentrate to water to increase citrate which is beneficial for stone prevention, limiting dietary sodium to less than 2000 mg per day, limiting animal protein to less than 2 servings (16 ounces/day), and limiting foods high in oxalate content (spinach, beans, chocolate, etc.).          Next Appointment:      Next Appointment: 01/15/2021 02:45 PM    Appointment Type: Surgery     Location: [facility] [phone]    Provider: Rexene Alberts, M.D.    Reason for Visit: [facility]/OP CYSTO RT RPG, RT URS, LL, RT STENT       Signed by Rexene Alberts, M.D. on 01/11/21 at 7:09 AM (EDT)  Urology Preoperative H&P   Chief Complaint: Right ureteral and renal stone  History of Present Illness: [name] [name] is a 80 y.o. female with right ureteral and renal stone here for R URS/LL. Preop Ucx 6/22 NG.    Past Medical History:  Diagnosis Date   Anemia    Arthritis    Ostearthritis- hips, knees, fingers   Diabetes mellitus without  complication (HCC)    DVT (deep venous thrombosis) (HCC)    Dyspnea    GERD (gastroesophageal reflux disease)    Headache(784.0)    tx. Valproic acid   History of kidney stones    Hypertension    Hypothyroidism    Pulmonary embolism [facility])     Past Surgical History:  Procedure Laterality Date   ABDOMINAL HYSTERECTOMY     BILATERAL HIP ARTHROSCOPY Left    BIOPSY  12/08/2019   Procedure: BIOPSY;  Surgeon: Daneil Dolin, MD;  Location: [facility];  Service: Endoscopy;;   CATARACT EXTRACTION, BILATERAL  COLONOSCOPY N/A 12/08/2019   polyps (tubular adenoma), diverticulosis, colonic lipoma, no surveillance due to age   35 W/ [place] Right 12/25/2020   Procedure: CYSTOSCOPY WITH RETROGRADE PYELOGRAM/URETERAL STENT PLACEMENT;  Surgeon: Janith Lima, MD;  Location: [facility];  Service: Urology;  Laterality: Right;   ESOPHAGOGASTRODUODENOSCOPY N/A 12/08/2019   normal esophagus with 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   GIVENS CAPSULE STUDY N/A 01/13/2020   Procedure: GIVENS CAPSULE STUDY;  Surgeon: Daneil Dolin, MD;  Location: [facility];  Service: Endoscopy;  Laterality: N/A;  7:30am   MULTIPLE TOOTH EXTRACTIONS     60's   PARATHYROIDECTOMY     POLYPECTOMY  12/08/2019   Procedure: POLYPECTOMY;  Surgeon: Daneil Dolin, MD;  Location: [facility];  Service: Endoscopy;;   TOTAL HIP ARTHROPLASTY Right 10/29/2013   Procedure: RIGHT TOTAL HIP ARTHROPLASTY ANTERIOR APPROACH;  Surgeon: Mauri Pole, MD;  Location: [facility];  Service: Orthopedics;  Laterality: Right;    Allergies: No Known Allergies  Family History  Problem Relation Age of Onset   Colitis Sister 44       alive   Cancer Sister 65       colon   Cancer Mother 16       uterine   Heart disease Father 108       heart failure   Heart attack Brother 68   Healthy Daughter    Healthy Son    Pulmonary embolism Brother 76   Heart disease Brother     Healthy Son    Healthy Son     Social History:  reports that she has never smoked. She has never used smokeless tobacco. She reports that she does not drink alcohol and does not use drugs.  ROS: A complete review of systems was performed.  All systems are negative except for pertinent findings as noted.  Physical Exam:  Vital signs in last 24 hours: Weight:  [92.4 kg] 92.4 kg (06/24 1255) Constitutional:  Alert and oriented, No acute distress Cardiovascular: Regular rate and rhythm Respiratory: Normal respiratory effort, Lungs clear bilaterally GI: Abdomen is soft, nontender, nondistended, no abdominal masses GU: No CVA tenderness Lymphatic: No lymphadenopathy Neurologic: Grossly intact, no focal deficits Psychiatric: Normal mood and affect  Laboratory Data:  No results for input(s): WBC, HGB, HCT, PLT in the last 72 hours.  No results for input(s): NA, K, CL, GLUCOSE, BUN, CALCIUM, CREATININE in the last 72 hours.  Invalid input(s): CO3   No results found for this or any previous visit (from the past 24 hour(s)). No results found for this or any previous visit (from the past 240 hour(s)).  Renal Function: No results for input(s): CREATININE in the last 168 hours. Estimated Creatinine Clearance: 50.8 mL/min (by C-G formula based on SCr of 0.97 mg/dL).  Radiologic Imaging: No results found.  I independently reviewed the above imaging studies.  Assessment and Plan Geneva Barrero is a 80 y.o. female with  right ureteral and renal stone here for R URS/LL. Preop Ucx 6/22 NG.  -The risks, benefits and alternatives of cystoscopy with R URS/LL with R JJ stent placement was discussed with the patient.  Risks include, but are not limited to: bleeding, urinary tract infection, ureteral injury, ureteral stricture disease, chronic pain, urinary symptoms, bladder injury, stent migration, the need for nephrostomy tube placement, MI, CVA, DVT, PE and the inherent risks with general  anesthesia.  The patient voices understanding and wishes to  proceed.     Matt R. Janei Scheff MD 01/15/2021, 1:04 PM  [facility] Pager: [phone]): [phone]

## 2021-01-15 NOTE — Anesthesia Postprocedure Evaluation (Signed)
Anesthesia Post Note  Patient: Daisia Slomski  Procedure(s) Performed: CYSTOSCOPY/RETROGRADE/URETEROSCOPY/HOLMIUM LASER/STENT EXCHANGE (Right)     Patient location during evaluation: PACU Anesthesia Type: General Level of consciousness: awake and alert Pain management: pain level controlled Vital Signs Assessment: post-procedure vital signs reviewed and stable Respiratory status: spontaneous breathing, nonlabored ventilation, respiratory function stable and patient connected to nasal cannula oxygen Cardiovascular status: blood pressure returned to baseline and stable Postop Assessment: no apparent nausea or vomiting Anesthetic complications: no   No notable events documented.  Last Vitals:  Vitals:   01/15/21 1705 01/15/21 1730  BP: (!) 147/97 (!) 146/100  Pulse: 100 100  Resp: 18 18  Temp:  36.4 C  SpO2: 96% 97%    Last Pain:  Vitals:   01/15/21 1730  TempSrc:   PainSc: 0-No pain                 Effie Berkshire

## 2021-01-15 NOTE — Anesthesia Preprocedure Evaluation (Addendum)
Anesthesia Evaluation  Patient identified by MRN, date of birth, ID band Patient awake    Reviewed: Allergy & Precautions, NPO status , Patient's Chart, lab work & pertinent test results  Airway Mallampati: I  TM Distance: <3 FB Neck ROM: Full    Dental  (+) Edentulous Upper, Edentulous Lower   Pulmonary    breath sounds clear to auscultation       Cardiovascular hypertension, Pt. on medications  Rhythm:Irregular Rate:Normal     Neuro/Psych  Headaches,  Neuromuscular disease negative psych ROS   GI/Hepatic Neg liver ROS, GERD  Medicated,  Endo/Other  diabetesHypothyroidism   Renal/GU negative Renal ROS     Musculoskeletal  (+) Arthritis ,   Abdominal Normal abdominal exam  (+)   Peds  Hematology   Anesthesia Other Findings   Reproductive/Obstetrics                           Anesthesia Physical Anesthesia Plan  ASA: 3  Anesthesia Plan: General   Post-op Pain Management:    Induction: Intravenous  PONV Risk Score and Plan: 4 or greater and Ondansetron and Treatment may vary due to age or medical condition  Airway Management Planned: LMA  Additional Equipment: None  Intra-op Plan:   Post-operative Plan: Extubation in OR  Informed Consent: I have reviewed the patients History and Physical, chart, labs and discussed the procedure including the risks, benefits and alternatives for the proposed anesthesia with the patient or authorized representative who has indicated his/her understanding and acceptance.     Dental advisory given  Plan Discussed with: CRNA  Anesthesia Plan Comments:        Anesthesia Quick Evaluation

## 2021-01-16 ENCOUNTER — Encounter (HOSPITAL_COMMUNITY): Payer: Self-pay | Admitting: Urology

## 2021-01-18 DIAGNOSIS — M19042 Primary osteoarthritis, left hand: Secondary | ICD-10-CM | POA: Diagnosis not present

## 2021-01-18 DIAGNOSIS — M1612 Unilateral primary osteoarthritis, left hip: Secondary | ICD-10-CM | POA: Diagnosis not present

## 2021-01-18 DIAGNOSIS — J9601 Acute respiratory failure with hypoxia: Secondary | ICD-10-CM | POA: Diagnosis not present

## 2021-01-18 DIAGNOSIS — M170 Bilateral primary osteoarthritis of knee: Secondary | ICD-10-CM | POA: Diagnosis not present

## 2021-01-18 DIAGNOSIS — I480 Paroxysmal atrial fibrillation: Secondary | ICD-10-CM | POA: Diagnosis not present

## 2021-01-18 DIAGNOSIS — E785 Hyperlipidemia, unspecified: Secondary | ICD-10-CM | POA: Diagnosis not present

## 2021-01-18 DIAGNOSIS — E119 Type 2 diabetes mellitus without complications: Secondary | ICD-10-CM | POA: Diagnosis not present

## 2021-01-18 DIAGNOSIS — M19041 Primary osteoarthritis, right hand: Secondary | ICD-10-CM | POA: Diagnosis not present

## 2021-01-18 DIAGNOSIS — D649 Anemia, unspecified: Secondary | ICD-10-CM | POA: Diagnosis not present

## 2021-01-18 DIAGNOSIS — N136 Pyonephrosis: Secondary | ICD-10-CM | POA: Diagnosis not present

## 2021-01-18 DIAGNOSIS — N179 Acute kidney failure, unspecified: Secondary | ICD-10-CM | POA: Diagnosis not present

## 2021-01-18 DIAGNOSIS — I10 Essential (primary) hypertension: Secondary | ICD-10-CM | POA: Diagnosis not present

## 2021-01-20 ENCOUNTER — Ambulatory Visit: Payer: Medicare Other | Admitting: Cardiology

## 2021-01-21 ENCOUNTER — Other Ambulatory Visit: Payer: Self-pay

## 2021-01-21 ENCOUNTER — Ambulatory Visit (INDEPENDENT_AMBULATORY_CARE_PROVIDER_SITE_OTHER): Payer: Medicare Other | Admitting: Family Medicine

## 2021-01-21 ENCOUNTER — Encounter: Payer: Self-pay | Admitting: Family Medicine

## 2021-01-21 VITALS — BP 122/79 | HR 100 | Temp 97.8°F | Ht 63.0 in | Wt 205.0 lb

## 2021-01-21 DIAGNOSIS — I480 Paroxysmal atrial fibrillation: Secondary | ICD-10-CM | POA: Diagnosis not present

## 2021-01-21 DIAGNOSIS — I10 Essential (primary) hypertension: Secondary | ICD-10-CM | POA: Diagnosis not present

## 2021-01-21 DIAGNOSIS — E119 Type 2 diabetes mellitus without complications: Secondary | ICD-10-CM

## 2021-01-21 DIAGNOSIS — M1A9XX0 Chronic gout, unspecified, without tophus (tophi): Secondary | ICD-10-CM | POA: Diagnosis not present

## 2021-01-21 DIAGNOSIS — R0902 Hypoxemia: Secondary | ICD-10-CM | POA: Diagnosis not present

## 2021-01-21 DIAGNOSIS — N179 Acute kidney failure, unspecified: Secondary | ICD-10-CM | POA: Diagnosis not present

## 2021-01-21 DIAGNOSIS — M170 Bilateral primary osteoarthritis of knee: Secondary | ICD-10-CM | POA: Diagnosis not present

## 2021-01-21 DIAGNOSIS — J9601 Acute respiratory failure with hypoxia: Secondary | ICD-10-CM | POA: Diagnosis not present

## 2021-01-21 DIAGNOSIS — D649 Anemia, unspecified: Secondary | ICD-10-CM | POA: Diagnosis not present

## 2021-01-21 DIAGNOSIS — E785 Hyperlipidemia, unspecified: Secondary | ICD-10-CM | POA: Diagnosis not present

## 2021-01-21 DIAGNOSIS — M19042 Primary osteoarthritis, left hand: Secondary | ICD-10-CM | POA: Diagnosis not present

## 2021-01-21 DIAGNOSIS — S31819D Unspecified open wound of right buttock, subsequent encounter: Secondary | ICD-10-CM

## 2021-01-21 DIAGNOSIS — N136 Pyonephrosis: Secondary | ICD-10-CM | POA: Diagnosis not present

## 2021-01-21 DIAGNOSIS — M19041 Primary osteoarthritis, right hand: Secondary | ICD-10-CM | POA: Diagnosis not present

## 2021-01-21 DIAGNOSIS — Z596 Low income: Secondary | ICD-10-CM

## 2021-01-21 DIAGNOSIS — M1612 Unilateral primary osteoarthritis, left hip: Secondary | ICD-10-CM | POA: Diagnosis not present

## 2021-01-21 MED ORDER — ALLOPURINOL 100 MG PO TABS
100.0000 mg | ORAL_TABLET | Freq: Every day | ORAL | 1 refills | Status: DC
Start: 2021-01-21 — End: 2021-09-28

## 2021-01-21 NOTE — Patient Instructions (Signed)
Have your son check your oxygen saturation while you are sleeping several times over the next week. Send me the #s and we can go from there with your oxygen.

## 2021-01-24 ENCOUNTER — Other Ambulatory Visit: Payer: Self-pay | Admitting: Family Medicine

## 2021-01-25 ENCOUNTER — Encounter: Payer: Self-pay | Admitting: Family Medicine

## 2021-01-25 NOTE — Progress Notes (Signed)
Assessment & Plan:  1. Type 2 diabetes mellitus without complication, without long-term current use of insulin (HCC) Lab Results  Component Value Date   HGBA1C 5.7 (H) 12/25/2020   HGBA1C 5.7 10/20/2020   HGBA1C 5.4 06/04/2020    - Diabetes is at goal of A1c < 7. - Medications:  None - diet controlled - Home glucose monitoring: continue monitoring - Patient is currently taking a statin. Patient is taking an ACE-inhibitor/ARB.  - Instruction/counseling given: reminded to get eye exam  Diabetes Health Maintenance Due  Topic Date Due   OPHTHALMOLOGY EXAM  Never done   FOOT EXAM  12/02/2020   URINE MICROALBUMIN  12/02/2020   HEMOGLOBIN A1C  06/26/2021    Lab Results  Component Value Date   LABMICR 101.5 12/03/2019   LABMICR 25.4 10/17/2016   - Microalbumin / creatinine urine ratio  2. Chronic gout without tophus, unspecified cause, unspecified site Well controlled on current regimen.  - allopurinol (ZYLOPRIM) 100 MG tablet; Take 1 tablet (100 mg total) by mouth daily.  Dispense: 90 tablet; Refill: 1  3. Essential hypertension Well controlled on current regimen.   4. Hypoxia Asked to have son spot check her oxygen saturation at night while she is sleeping (she lives with him) to make sure she is not dropping at night. If not, I will give the order for the company to come pick up the oxygen. If so, she needs to wear the oxygen at night while she is sleeping.  5. Wound of right buttock, subsequent encounter Resolved.  6. Patient cannot afford medications Unable to afford Eliquis.  - AMB Referral to [facility]   Return in about 3 months (around 04/23/2021) for annual physical.  [name], MSN, APRN, FNP-C [facility]  Subjective:    Patient ID: [name] [name], female    DOB: [birth year], 80 y.o.   MRN: [id number]  Patient Care Team: Loman Brooklyn, FNP as PCP - General (Family Medicine) Paralee Cancel, MD as Consulting  Physician (Orthopedic Surgery) Ilean China, RN as Case Manager [name], NP (Gastroenterology) Minus Breeding, MD as Consulting Physician (Cardiology)   Chief Complaint:  Chief Complaint  Patient presents with   Diabetes    3 month follow up of chronic medical conditions      HPI: [name] [name] is a 80 y.o. female presenting on 01/21/2021 for Diabetes (3 month follow up of chronic medical conditions /)  Diabetes: Patient presents for follow up of diabetes. Current symptoms include: none. Known diabetic complications: none. Medication compliance: no longer taking medication (Metformin discontinued at our last visit due to excellent A1c readings). Current diet: in general, a "healthy" diet  . Current exercise: none. Home blood sugar records: BGs range between 97 and 104 . Is she  on ACE inhibitor or angiotensin II receptor blocker? Yes. Is she on a statin? Yes.   Hypertension: Patient is checking her blood pressure at home and reports her systolic is generally [id number].  She does eat a low-salt diet. Unable to exercise due to her balance and knees.  Hypoxia: patient is no longer wearing oxygen. States she has not worn is during the day in a long time. She has not worn it at night in over a week. States she has been checking her oxygen saturation immediately upon waking to make sure her oxygen saturation is not low and she has not seen anything <94%.   Right buttock wound: patient reports it has resolved.  Patient states her Eliquis is costing her $446 for a 3 month supply because she is in the donut hole.   New complaints: None  Social history:  Relevant past medical, surgical, family and social history reviewed and updated as indicated. Interim medical history since our last visit reviewed.  Allergies and medications reviewed and updated.  DATA REVIEWED: CHART IN EPIC  ROS: Negative unless specifically indicated above in HPI.    Current Outpatient Medications:     acetaminophen (TYLENOL) 325 MG tablet, Take 2 tablets (650 mg total) by mouth every 6 (six) hours as needed for mild pain or headache (or Fever >/= 101)., Disp: 12 tablet, Rfl: 0   apixaban (ELIQUIS) 5 MG TABS tablet, Take 1 tablet (5 mg total) by mouth 2 (two) times daily., Disp: 90 tablet, Rfl: 3   atorvastatin (LIPITOR) 20 MG tablet, Take 1 tablet (20 mg total) by mouth daily., Disp: 30 tablet, Rfl: 2   Cholecalciferol (VITAMIN D-3) 5000 UNITS TABS, Take 5,000 Units by mouth daily., Disp: , Rfl:    diclofenac Sodium (VOLTAREN) 1 % GEL, Apply 4 g topically 4 (four) times daily. (Patient taking differently: Apply 4 g topically daily.), Disp: 100 g, Rfl: 2   diltiazem (CARTIA XT) 240 MG 24 hr capsule, Take 1 capsule (240 mg total) by mouth daily., Disp: 30 capsule, Rfl: 2   docusate sodium (COLACE) 100 MG capsule, Take 1 capsule (100 mg total) by mouth daily as needed for up to 30 doses., Disp: 30 capsule, Rfl: 0   EUTHYROX 50 MCG tablet, TAKE 1 TABLET BY MOUTH ONCE DAILY BEFORE BREAKFAST (Patient taking differently: Take 50 mcg by mouth daily before breakfast.), Disp: 30 tablet, Rfl: 10   ferrous sulfate 325 (65 FE) MG tablet, Take 325 mg by mouth daily with breakfast., Disp: , Rfl:    furosemide (LASIX) 20 MG tablet, TAKE 1 TABLET BY MOUTH  TWICE DAILY (Patient taking differently: Take 20 mg by mouth 2 (two) times daily.), Disp: 180 tablet, Rfl: 0   hydrocortisone cream 0.5 %, Apply topically 2 (two) times daily. (Patient taking differently: Apply 1 application topically daily.), Disp: 30 g, Rfl: 0   loratadine (CLARITIN) 10 MG tablet, Take 10 mg by mouth daily., Disp: , Rfl:    ONE TOUCH ULTRA TEST test strip, USE UP TO FOUR TIMES DAILY AS DIRECTED, Disp: 400 each, Rfl: 3   oxybutynin (DITROPAN-XL) 10 MG 24 hr tablet, Take 1 tablet (10 mg total) by mouth at bedtime., Disp: 90 tablet, Rfl: 1   pantoprazole (PROTONIX) 40 MG tablet, Take 1 tablet (40 mg total) by mouth 2 (two) times daily before a  meal., Disp: 180 tablet, Rfl: 1   allopurinol (ZYLOPRIM) 100 MG tablet, Take 1 tablet (100 mg total) by mouth daily., Disp: 90 tablet, Rfl: 1   No Known Allergies Past Medical History:  Diagnosis Date   Anemia    Arthritis    Ostearthritis- hips, knees, fingers   Diabetes mellitus without complication (HCC)    DVT (deep venous thrombosis) (HCC)    Dyspnea    GERD (gastroesophageal reflux disease)    Headache(784.0)    tx. Valproic acid   History of kidney stones    Hypertension    Hypothyroidism    Pulmonary embolism [facility])     Past Surgical History:  Procedure Laterality Date   ABDOMINAL HYSTERECTOMY     BILATERAL HIP ARTHROSCOPY Left    BIOPSY  12/08/2019   Procedure: BIOPSY;  Surgeon: Daneil Dolin,  MD;  Location: [facility];  Service: Endoscopy;;   CATARACT EXTRACTION, BILATERAL     COLONOSCOPY N/A 12/08/2019   polyps (tubular adenoma), diverticulosis, colonic lipoma, no surveillance due to age   11 W/ [place] Right 12/25/2020   Procedure: CYSTOSCOPY WITH RETROGRADE PYELOGRAM/URETERAL STENT PLACEMENT;  Surgeon: Janith Lima, MD;  Location: [facility];  Service: Urology;  Laterality: Right;   CYSTOSCOPY/URETEROSCOPY/HOLMIUM LASER/STENT PLACEMENT Right 01/15/2021   Procedure: CYSTOSCOPY/RETROGRADE/URETEROSCOPY/HOLMIUM LASER/STENT EXCHANGE;  Surgeon: Janith Lima, MD;  Location: [facility];  Service: Urology;  Laterality: Right;   ESOPHAGOGASTRODUODENOSCOPY N/A 12/08/2019   normal esophagus with 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   GIVENS CAPSULE STUDY N/A 01/13/2020   Procedure: GIVENS CAPSULE STUDY;  Surgeon: Daneil Dolin, MD;  Location: [facility];  Service: Endoscopy;  Laterality: N/A;  7:30am   MULTIPLE TOOTH EXTRACTIONS     60's   PARATHYROIDECTOMY     POLYPECTOMY  12/08/2019   Procedure: POLYPECTOMY;  Surgeon: Daneil Dolin, MD;  Location: [facility];  Service: Endoscopy;;   TOTAL HIP  ARTHROPLASTY Right 10/29/2013   Procedure: RIGHT TOTAL HIP ARTHROPLASTY ANTERIOR APPROACH;  Surgeon: Mauri Pole, MD;  Location: [facility];  Service: Orthopedics;  Laterality: Right;    Social History   Socioeconomic History   Marital status: Widowed    Spouse name: Not on file   Number of children: 4   Years of education: 12   Highest education level: High school graduate  Occupational History   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 She has four adult children and one son lives with her.    Social Determinants of Health   Financial Resource Strain: Low Risk    Difficulty of Paying Living Expenses: Not hard at all  Food Insecurity: No Food Insecurity   Worried About Charity fundraiser in the Last Year: Never true   [place] in the Last Year: Never true  Transportation Needs: No Transportation Needs   Lack of Transportation (Medical): No   Lack of Transportation (Non-Medical): No  Physical Activity: Insufficiently Active   Days of Exercise per Week: 7 days   Minutes of Exercise per Session: 20 min  Stress: No Stress Concern Present   Feeling of Stress : Not at all  Social Connections: Moderately Integrated   Frequency of Communication with Friends and Family: More than three times a week   Frequency of Social Gatherings with Friends and Family: More than three times a week   Attends Religious Services: More than 4 times per year   Active Member of [employer] or Organizations: Yes   Attends Archivist Meetings: More than 4 times per year   Marital Status: Widowed  Human resources officer Violence: Not At Risk    Fear of Current or Ex-Partner: No   Emotionally Abused: No   Physically Abused: No   Sexually Abused: No        Objective:    BP 122/79   Pulse 100  Temp 97.8 F (36.6 C) (Temporal)   Ht _0  (1.6 m)   Wt 205 lb (93 kg)   SpO2 96%   BMI 36.31 kg/m   Wt Readings from Last 3 Encounters:  01/21/21 205 lb (93 kg)  01/15/21 203 lb 11.3 oz (92.4 kg)  01/11/21 203 lb 12.8 oz (92.4 kg)    Physical Exam Vitals reviewed.  Constitutional:      General: She is not in acute distress.    Appearance: Normal appearance. She is morbidly obese. She is not ill-appearing, toxic-appearing or diaphoretic.  HENT:     Head: Normocephalic and atraumatic.  Eyes:     General: No scleral icterus.       Right eye: No discharge.        Left eye: No discharge.     Conjunctiva/sclera: Conjunctivae normal.  Cardiovascular:     Rate and Rhythm: Normal rate and regular rhythm.     Heart sounds: Normal heart sounds. No murmur heard.   No friction rub. No gallop.  Pulmonary:     Effort: Pulmonary effort is normal. No respiratory distress.     Breath sounds: Normal breath sounds. No stridor. No wheezing, rhonchi or rales.  Musculoskeletal:        General: Normal range of motion.     Cervical back: Normal range of motion.  Skin:    General: Skin is warm and dry.     Capillary Refill: Capillary refill takes less than 2 seconds.  Neurological:     General: No focal deficit present.     Mental Status: She is alert and oriented to person, place, and time. Mental status is at baseline.     Gait: Gait abnormal (ambulates with walker).  Psychiatric:        Mood and Affect: Mood normal.        Behavior: Behavior normal.        Thought Content: Thought content normal.        Judgment: Judgment normal.    Lab Results  Component Value Date   TSH 2.850 10/20/2020   Lab Results  Component Value Date   WBC 6.7 01/05/2021   HGB 9.9 (L) 01/05/2021   HCT 31.1 (L) 01/05/2021   MCV 96 01/05/2021    PLT 359 01/05/2021   Lab Results  Component Value Date   NA 141 01/05/2021   K 4.3 01/05/2021   CO2 21 01/05/2021   GLUCOSE 105 (H) 01/05/2021   BUN 18 01/05/2021   CREATININE 0.97 01/05/2021   BILITOT 0.3 01/05/2021   ALKPHOS 329 (H) 01/05/2021   AST 13 01/05/2021   ALT 17 01/05/2021   PROT 5.7 (L) 01/05/2021   ALBUMIN 3.5 (L) 01/05/2021   CALCIUM 10.2 01/05/2021   ANIONGAP 10 12/30/2020   EGFR 59 (L) 01/05/2021   Lab Results  Component Value Date   CHOL 174 10/20/2020   Lab Results  Component Value Date   HDL 55 10/20/2020   Lab Results  Component Value Date   LDLCALC 90 10/20/2020   Lab Results  Component Value Date   TRIG 193 (H) 12/26/2020   Lab Results  Component Value Date   CHOLHDL 3.2 10/20/2020   Lab Results  Component Value Date   HGBA1C 5.7 (H) 12/25/2020

## 2021-01-26 ENCOUNTER — Encounter: Payer: Self-pay | Admitting: Family Medicine

## 2021-01-26 DIAGNOSIS — Z87442 Personal history of urinary calculi: Secondary | ICD-10-CM | POA: Diagnosis not present

## 2021-01-26 DIAGNOSIS — N202 Calculus of kidney with calculus of ureter: Secondary | ICD-10-CM | POA: Diagnosis not present

## 2021-01-26 NOTE — Telephone Encounter (Signed)
Please call [facility] and cancel home oxygen so they will come pick it up.

## 2021-01-29 ENCOUNTER — Telehealth: Payer: Self-pay

## 2021-01-29 DIAGNOSIS — J9601 Acute respiratory failure with hypoxia: Secondary | ICD-10-CM | POA: Diagnosis not present

## 2021-01-29 NOTE — Chronic Care Management (AMB) (Signed)
  Chronic Care Management   Note  01/29/2021 Name: Ervin Rothbauer MRN: [id number] DOB: 21-May-1941  Chantrell Apsey is a 80 y.o. year old female who is a primary care patient of Loman Brooklyn, FNP. Shaley Leavens is currently enrolled in care management services. An additional referral for Pharm D   was placed.   Follow up plan: Telephone appointment with care management team member scheduled for:02/12/2021  Noreene Larsson, [address], [place], [place] [address] Direct Dial: [phone] [email or web address]: [email or web address]

## 2021-01-30 DIAGNOSIS — J9601 Acute respiratory failure with hypoxia: Secondary | ICD-10-CM | POA: Diagnosis not present

## 2021-02-01 ENCOUNTER — Encounter: Payer: Self-pay | Admitting: Family Medicine

## 2021-02-02 ENCOUNTER — Ambulatory Visit (INDEPENDENT_AMBULATORY_CARE_PROVIDER_SITE_OTHER): Payer: Medicare Other | Admitting: *Deleted

## 2021-02-02 DIAGNOSIS — E119 Type 2 diabetes mellitus without complications: Secondary | ICD-10-CM

## 2021-02-02 DIAGNOSIS — I10 Essential (primary) hypertension: Secondary | ICD-10-CM

## 2021-02-02 DIAGNOSIS — E1169 Type 2 diabetes mellitus with other specified complication: Secondary | ICD-10-CM

## 2021-02-02 NOTE — Chronic Care Management (AMB) (Signed)
Chronic Care Management   CCM RN Visit Note  02/02/2021 Name: [name] [name] MRN: [id number] DOB: 1940-10-09  Subjective: [name] [name] is a 80 y.o. year old female who is a primary care patient of Loman Brooklyn, FNP. The care management team was consulted for assistance with disease management and care coordination needs.    Engaged with patient by telephone for follow up visit in response to provider referral for case management and/or care coordination services.   Consent to Services:  The patient was given information about Chronic Care Management services, agreed to services, and gave verbal consent prior to initiation of services.  Please see initial visit note for detailed documentation.   Patient agreed to services and verbal consent obtained.   Assessment: Review of patient past medical history, allergies, medications, health status, including review of consultants reports, laboratory and other test data, was performed as part of comprehensive evaluation and provision of chronic care management services.   SDOH (Social Determinants of Health) assessments and interventions performed:    CCM Care Plan  No Known Allergies  Outpatient Encounter Medications as of 02/02/2021  Medication Sig   acetaminophen (TYLENOL) 325 MG tablet Take 2 tablets (650 mg total) by mouth every 6 (six) hours as needed for mild pain or headache (or Fever >/= 101).   allopurinol (ZYLOPRIM) 100 MG tablet Take 1 tablet (100 mg total) by mouth daily.   apixaban (ELIQUIS) 5 MG TABS tablet Take 1 tablet (5 mg total) by mouth 2 (two) times daily.   atorvastatin (LIPITOR) 20 MG tablet Take 1 tablet (20 mg total) by mouth daily.   Cholecalciferol (VITAMIN D-3) 5000 UNITS TABS Take 5,000 Units by mouth daily.   diclofenac Sodium (VOLTAREN) 1 % GEL APPLY 4 GRAMS TOPICALLY 4  TIMES DAILY   diltiazem (CARTIA XT) 240 MG 24 hr capsule Take 1 capsule (240 mg total) by mouth daily.   docusate sodium  (COLACE) 100 MG capsule Take 1 capsule (100 mg total) by mouth daily as needed for up to 30 doses.   EUTHYROX 50 MCG tablet TAKE 1 TABLET BY MOUTH ONCE DAILY BEFORE BREAKFAST (Patient taking differently: Take 50 mcg by mouth daily before breakfast.)   ferrous sulfate 325 (65 FE) MG tablet Take 325 mg by mouth daily with breakfast.   furosemide (LASIX) 20 MG tablet TAKE 1 TABLET BY MOUTH  TWICE DAILY (Patient taking differently: Take 20 mg by mouth 2 (two) times daily.)   hydrocortisone cream 0.5 % Apply topically 2 (two) times daily. (Patient taking differently: Apply 1 application topically daily.)   loratadine (CLARITIN) 10 MG tablet Take 10 mg by mouth daily.   ONE TOUCH ULTRA TEST test strip USE UP TO FOUR TIMES DAILY AS DIRECTED   oxybutynin (DITROPAN-XL) 10 MG 24 hr tablet Take 1 tablet (10 mg total) by mouth at bedtime.   pantoprazole (PROTONIX) 40 MG tablet Take 1 tablet (40 mg total) by mouth 2 (two) times daily before a meal.   No facility-administered encounter medications on file as of 02/02/2021.    Patient Active Problem List   Diagnosis Date Noted   Educated about COVID-19 virus infection 02/02/2020   Iron deficiency anemia due to chronic blood loss    Occult blood in stools    Unprovoked DVT and Unprovoked Pulmonary Embolism -Dxed 03/2019 04/03/2019   Sciatica of right side 10/18/2018   Seborrheic keratosis 10/17/2016   Hyperlipidemia associated with type 2 diabetes mellitus ([place]) 10/17/2016   Overactive bladder 10/17/2016  Gastroesophageal reflux disease without esophagitis 10/17/2016   Primary osteoarthritis involving multiple joints 10/17/2016   Chronic gout without tophus 10/17/2016   Type 2 diabetes mellitus without complication, without long-term current use of insulin ([place]) 05/16/2016   Essential hypertension 05/16/2016   Hypothyroidism 05/16/2016   Morbid obesity ([place]) 10/30/2013   S/P right THA, AA 10/29/2013    Conditions to be addressed/monitored:HTN, HLD,  and DMII  Care Plan : RNCM: Iron Deficiency (Adult)  Updates made by Ilean China, RN since 02/02/2021 12:00 AM  Completed 02/02/2021   Problem: Iron Deficiency Resolved 02/02/2021  Priority: Medium  Note:   In a patient with HTN, HLD, DM, hypothyroidism, OA, osteoporosis     Long-Range Goal: Anemia Prevented or Managed Completed 02/02/2021  Start Date: 11/05/2020  Recent Progress: On track  Priority: Medium  Note:   Objective: Lab Results  Component Value Date   WBC 6.3 10/20/2020   HGB 13.7 10/20/2020   HCT 41.3 10/20/2020   MCV 93 10/20/2020   PLT 211 10/20/2020   Lab Results  Component Value Date   IRON 98 10/20/2020   TIBC 350 10/20/2020   FERRITIN 54 10/20/2020  Current Barriers:  Chronic Disease Management support and education needs related to anemia On Eliquis for Afib Neg colonoscopy and endosocpy  Nurse Case Manager Clinical Goal(s):  patient will take all medications exactly as prescribed and will call provider for medication related questions Patient will work with gastroenterologist to address needs related to anemia Patient will reach out to [place] as needed to assist with care coordination  Interventions:  Collaboration with Loman Brooklyn, FNP regarding development and update of comprehensive plan of care as evidenced by provider attestation and co-signature Inter-disciplinary care team collaboration (see longitudinal plan of care) Basic overview and discussion of anemia/bleeding disorder or acute disease state Medications reviewed and discussed Recent office visit notes, imaging reports, and lab results reviewed and discussed Hemoglobin and iron are WNL now Holding off on additional imaging studies for now since labs ar WNL encouraged optimal oral intake to support fluid balance and nutrition encouraged dietary changes to increase dietary intake of iron, Vitamin T24 and folic acid as advised/prescribed Monitored for constipation;  encourage increased fluid and fiber intake, and use of stool softeners or laxatives.  Reviewed upcoming appointments with GI and PCP Advised to call GI or PCP with any new or worsening symptoms and to seek appropriate medical care as needed Provided with RNCM contact information and encouraged to reach out as needed  Patient Goals/Self-Care Activities: don't eat dairy products, such as milk, cheese and ice cream with those high in iron eat dark, leafy greens, such as spinach, chard and collards include high-iron food, such as meat, chicken, fish, shellfish, dried fruit, beans and nuts in each meal pair iron-rich foods with foods high in vitamin C, such as oranges, strawberries and red peppers read food labels for iron, vitamin and fiber  Take medication and supplements as prescribed Avoid constipation by increasing fiber and water intake and by taking a stool softener as needed Keep f/u with GI Call GI or PCP with any new or worsening symptoms  Follow Up Plan:  Telephone follow up appointment with care management team member scheduled for: 12/31/20 with RNCM The patient has been provided with contact information for the care management team and has been advised to call with any health related questions or concerns.  Next PCP appointment scheduled for: 01/21/21 with [name], FNP Next GI appointment  scheduled for: 05/18/21       Care Plan : RNCM: Care Coordinatino after Hospitalization  Updates made by Ilean China, RN since 02/02/2021 12:00 AM  Completed 02/02/2021   Problem: Care Coordination Needs s/p Hospitalization for Sepsis secondary to UTI Resolved 02/02/2021  Priority: High     Goal: Follow Instructions for Post Discharge Care Completed 02/02/2021  Start Date: 12/31/2020  Recent Progress: On track  Priority: High  Note:   Current Barriers:  Care Coordination needs related to post hospital discharge care in a patient with recent sepsis secondary to UTI Unable to  independently stay by herself presently Comorbidities: HTN, HLD, DM, hypothyroidism, OA, osteoporosis, anemia  Nurse Case Manager Clinical Goal(s):  patient will work with urologist to address needs related to recent sepsis secondary to UTI and renal stone patient will meet with RN Care Manager to address self-management of chronic medical conditions and ADLs the patient will demonstrate ongoing self health care management ability as evidenced by attending all scheduled medical appointments and by reaching out to PCP or specialist with any new or worsening symptoms*  Interventions:  1:1 collaboration with Loman Brooklyn, FNP regarding development and update of comprehensive plan of care as evidenced by provider attestation and co-signature Inter-disciplinary care team collaboration (see longitudinal plan of care) Chart reviewed including relevant office notes, Hosp notes, correspondence notes, lab results, and imaging reports Evaluation of current treatment plan related to sepsis and patient's adherence to plan as established by provider. Previously reviewed medications with patient and discussed importance of medication adherence Previously discussed hospital stay and discharge instructions Previously reviewed upcoming appointments: Urologist on 01/13/21 for hosp f/u Previously discussed orders for home health physical therapy through [place] and Oxygen through [facility] Patient has not been contacted by [facility] yet but expects oxygen delivery today. She was sent home with oxygen from the hospital to last until concentrator arrives.  Collaborated with Amy Hopkins, LPN with [facility]'s [facility] and TOC clinic, and verified that oxygen was delivered today and that home health physical therapy has scheduled a visit for an evaluation Previously encouraged to seek appropriate medical attention for any new or worsening symptoms Previously encouraged proper hydration Previously discussed plans  with patient for ongoing care management follow up and provided patient with direct contact information for care management team  Self Care Activities:  Self administers medications as prescribed Calls pharmacy for medication refills Calls provider office for new concerns or questions  Patient Goals Over the next 30 days, patient will: Follow-up with urologist Take medication as prescribed Start home health physical therapy Work with [place] regarding any questions or concerns about oxygen concentrator Seek appropriate medical attention for any new or worsening symptoms Call [place] as needed [phone]  Follow Up Plan:  Telephone follow up appointment with care management team member scheduled for: 02/02/21 with RNCM to verify that oxygen has been delivered and home health has made contact The patient has been provided with contact information for the care management team and has been advised to call with any health related questions or concerns.      Care Plan : [facility] Care Plan  Updates made by Ilean China, RN since 02/02/2021 12:00 AM     Problem: Chronic Disease Management Needs (DM, HTN, HLD)   Priority: Medium     Long-Range Goal: Work with [place] Coordination Regarding Chronic Medical Conditions   Start Date: 02/02/2021  This Visit's Progress: On track  Priority: Medium  Note:   Current Barriers:  Chronic Disease Management support and education needs related to HTN, HLD, and DMII  RNCM Clinical Goal(s):  Patient will work with RN Case Manager to address needs related to HTN, HLD, and DMII and recent hospitalization for septic shock secondary to kidney stone and UTI demonstrate ongoing self health care management ability through collaboration with RN Care manager, provider, and care team.   Interventions: 1:1 collaboration with _0 @ regarding development and update of comprehensive plan of care as evidenced by provider  attestation and co-signature Inter-disciplinary care team collaboration (see longitudinal plan of care) Reviewed chart including relevant office notes, lab results, and consultation reports   Diabetes:  (Status: Goal on track: YES.) Lab Results  Component Value Date   HGBA1C 5.7 (H) 12/25/2020  Assessed patient's understanding of A1c goal: <7% Reviewed and discussed most recent A1C Reviewed medications with patient and discussed importance of medication adherence; Counseled on importance of regular laboratory monitoring as prescribed; Discussed plans with patient for ongoing care management follow up and provided patient with direct contact information for care management team; Reviewed scheduled/upcoming provider appointments including: Lottie Dawson, PharmD on 02/12/21 for prescription assistance; Encouraged to continue to follow an ADA/carb modified diet  Hypertension: (Status: Goal on track: YES.) Last practice recorded BP readings:  BP Readings from Last 3 Encounters:  01/21/21 122/79  01/15/21 (!) 146/100  01/11/21 (!) 145/85  Most recent eGFR/CrCl:  Lab Results  Component Value Date   EGFR 59 (L) 01/05/2021    No components found for: CRCL  Evaluation of current treatment plan related to hypertension self management and patient's adherence to plan as established by provider; Reviewed medications with patient and discussed importance of compliance; Discussed plans with patient for ongoing care management follow up and provided patient with direct contact information for care management team; Advised patient, providing education and rationale, to monitor blood pressure daily and record, calling PCP for findings outside established parameters;  Reviewed scheduled/upcoming provider appointments including:  04/03/21 with PCP  Reviewed and discussed recent office visit with cardiologist  Questioned mobility and physical activity level. Both are improving. Physical and occupational  therapy have ended. Continues to use a walker from time to time but is mostly ambulating on her own without difficulty. She is able to perform ADLs independently.  Discussed oxygen use. Patient is not using oxygen and is waiting for supplier to pick it up. O2 levels have been normal during the day and night.   HDL  (Status: Goal on track: YES.) Evaluation of current treatment plan related to HLD self-management and patient's adherence to plan as established by provider. Discussed plans with patient for ongoing care management follow up and provided patient with direct contact information for care management team Reviewed and discussed recent lab results Reviewed and discussed medications and compliance Encouraged patient to continue increasing physical activity level as tolerated  Patient Goals/Self-Care Activities: Patient will self administer medications as prescribed Patient will attend all scheduled provider appointments Patient will continue to perform ADL's independently Patient will call provider office for new concerns or questions  Follow Up Plan:  Telephone follow up appointment with care management team member scheduled for: 03/18/21 with [facility] The patient has been provided with contact information for the care management team and has been advised to call with any health related questions or concerns.      Chong Sicilian, BSN, RN-BC Embedded Chronic Care Manager [facility] Care Management Direct Dial:  [phone]

## 2021-02-02 NOTE — Patient Instructions (Signed)
Visit Information  PATIENT GOALS:  Goals Addressed             This Visit's Progress    COMPLETED: Follow Instructions for Post Discharge Care       Timeframe:  Short-Term Goal Priority:  High Start Date:      12/31/20                        Expected End Date:   02/21/21                    Follow-up: 02/02/21  Follow-up with urologist Take medication as prescribed Start home health physical therapy Work with [place] regarding any questions or concerns about oxygen concentrator Seek appropriate medical attention for any new or worsening symptoms Call RN Care Manager as needed [phone]      Manage High Cholesterol       Timeframe:  Long-Range Goal Priority:  Medium Start Date:   02/02/21                          Expected End Date:  02/02/22                      Follow-up 03/18/21  Take medication as prescribed Keep all medical appointments Increase physical activity as tolerated with a goal of 150 minutes a week Follow a heart healthy diet and limit or elimiate simple carbs and fried/greasy foods Call RN Care Manager as needed [phone]      COMPLETED: Manage Iron Deficiency Anemia       Timeframe:  Long-Range Goal Priority:  Medium Start Date:   11/05/20                          Expected End Date:   07/24/21                      Follow Up Date 12/31/20    don't eat dairy products, such as milk, cheese and ice cream with those high in iron eat dark, leafy greens, such as spinach, chard and collards include high-iron food, such as meat, chicken, fish, shellfish, dried fruit, beans and nuts in each meal pair iron-rich foods with foods high in vitamin C, such as oranges, strawberries and red peppers read food labels for iron, vitamin and fiber  Take medication and supplements as prescribed Avoid constipation by increasing fiber and water intake and by taking a stool softener as needed Keep f/u with GI Call GI or PCP with any new or worsening symptoms   Why is  this important?   Low iron in the blood may cause anemia.  Preventing this keeps you healthy and strong.  It is best to eat foods that are high in iron and B vitamins.  Also, foods and drinks that help the body use the iron and vitamins are good for you.     Notes:       Monitor and Manage My Blood Sugar-Diabetes Type 2       Timeframe:  Long-Range Goal Priority:  Medium Start Date: 02/02/21                            Expected End Date:  Follow Up Date 03/18/21    Check and record blood sugar daily Call PCP with any readings outside of recommended range Continue to follow an ADA/Carb modified diet Keep all medical appointments Schedule diabetic eye exam Check feet daily and call PCP with any new sores, calluses, discoloration, etc Call RN Care Manager as needed [phone]    Why is this important?   Checking your blood sugar at home helps to keep it from getting very high or very low.  Writing the results in a diary or log helps the doctor know how to care for you.  Your blood sugar log should have the time, date and the results.  Also, write down the amount of insulin or other medicine that you take.  Other information, like what you ate, exercise done and how you were feeling, will also be helpful.     Notes:       Track and Manage My Blood Pressure-Hypertension       Timeframe:  Long-Range Goal Priority:  Medium Start Date:    02/02/21                         Expected End Date:    02/02/22                   Follow Up Date 03/18/21    Check and record blood pressure and pulse daily and as needed Call PCP or cardiologist with any readings outside of recommended range Take all medication prescribed Keep all medical appointments Increase physical activity as tolerated with a goal of 150 minutes a week Call [place] as needed [phone]   Why is this important?   You won't feel high blood pressure, but it can still hurt your blood vessels.   High blood pressure can cause heart or kidney problems. It can also cause a stroke.  Making lifestyle changes like losing a little weight or eating less salt will help.  Checking your blood pressure at home and at different times of the day can help to control blood pressure.  If the doctor prescribes medicine remember to take it the way the doctor ordered.  Call the office if you cannot afford the medicine or if there are questions about it.     Notes:          Patient verbalizes understanding of instructions provided today and agrees to view in [place].   Telephone follow up appointment with care management team member scheduled for: 03/18/21 with RNCM  Chong Sicilian, BSN, RN-BC [place] / [place] Management Direct Dial: [phone]

## 2021-02-04 ENCOUNTER — Ambulatory Visit: Payer: Medicare Other | Admitting: *Deleted

## 2021-02-04 ENCOUNTER — Telehealth: Payer: Self-pay | Admitting: *Deleted

## 2021-02-04 DIAGNOSIS — I10 Essential (primary) hypertension: Secondary | ICD-10-CM

## 2021-02-04 DIAGNOSIS — I4891 Unspecified atrial fibrillation: Secondary | ICD-10-CM

## 2021-02-04 DIAGNOSIS — E1169 Type 2 diabetes mellitus with other specified complication: Secondary | ICD-10-CM

## 2021-02-04 DIAGNOSIS — I482 Chronic atrial fibrillation, unspecified: Secondary | ICD-10-CM | POA: Insufficient documentation

## 2021-02-04 DIAGNOSIS — E785 Hyperlipidemia, unspecified: Secondary | ICD-10-CM | POA: Diagnosis not present

## 2021-02-04 DIAGNOSIS — E119 Type 2 diabetes mellitus without complications: Secondary | ICD-10-CM | POA: Diagnosis not present

## 2021-02-04 NOTE — Chronic Care Management (AMB) (Signed)
Chronic Care Management   CCM RN Visit Note  02/04/2021 Name: [name] [name] MRN: [id number] DOB: 11/07/1940  Subjective: [name] [name] is a 80 y.o. year old female who is a primary care patient of Loman Brooklyn, FNP. The care management team was consulted for assistance with disease management and care coordination needs.    Engaged with patient by telephone for follow up visit in response to provider referral for case management and/or care coordination services.   Consent to Services:  The patient was given information about Chronic Care Management services, agreed to services, and gave verbal consent prior to initiation of services.  Please see initial visit note for detailed documentation.   Patient agreed to services and verbal consent obtained.   Assessment: Review of patient past medical history, allergies, medications, health status, including review of consultants reports, laboratory and other test data, was performed as part of comprehensive evaluation and provision of chronic care management services.   SDOH (Social Determinants of Health) assessments and interventions performed:    CCM Care Plan  No Known Allergies  Outpatient Encounter Medications as of 02/04/2021  Medication Sig   acetaminophen (TYLENOL) 325 MG tablet Take 2 tablets (650 mg total) by mouth every 6 (six) hours as needed for mild pain or headache (or Fever >/= 101).   allopurinol (ZYLOPRIM) 100 MG tablet Take 1 tablet (100 mg total) by mouth daily.   apixaban (ELIQUIS) 5 MG TABS tablet Take 1 tablet (5 mg total) by mouth 2 (two) times daily.   atorvastatin (LIPITOR) 20 MG tablet Take 1 tablet (20 mg total) by mouth daily.   Cholecalciferol (VITAMIN D-3) 5000 UNITS TABS Take 5,000 Units by mouth daily.   diclofenac Sodium (VOLTAREN) 1 % GEL APPLY 4 GRAMS TOPICALLY 4  TIMES DAILY   diltiazem (CARTIA XT) 240 MG 24 hr capsule Take 1 capsule (240 mg total) by mouth daily.   docusate sodium  (COLACE) 100 MG capsule Take 1 capsule (100 mg total) by mouth daily as needed for up to 30 doses.   EUTHYROX 50 MCG tablet TAKE 1 TABLET BY MOUTH ONCE DAILY BEFORE BREAKFAST (Patient taking differently: Take 50 mcg by mouth daily before breakfast.)   ferrous sulfate 325 (65 FE) MG tablet Take 325 mg by mouth daily with breakfast.   furosemide (LASIX) 20 MG tablet TAKE 1 TABLET BY MOUTH  TWICE DAILY (Patient taking differently: Take 20 mg by mouth 2 (two) times daily.)   hydrocortisone cream 0.5 % Apply topically 2 (two) times daily. (Patient taking differently: Apply 1 application topically daily.)   loratadine (CLARITIN) 10 MG tablet Take 10 mg by mouth daily.   ONE TOUCH ULTRA TEST test strip USE UP TO FOUR TIMES DAILY AS DIRECTED   oxybutynin (DITROPAN-XL) 10 MG 24 hr tablet Take 1 tablet (10 mg total) by mouth at bedtime.   pantoprazole (PROTONIX) 40 MG tablet Take 1 tablet (40 mg total) by mouth 2 (two) times daily before a meal.   No facility-administered encounter medications on file as of 02/04/2021.    Patient Active Problem List   Diagnosis Date Noted   A-fib ([place]) 02/04/2021   Educated about COVID-19 virus infection 02/02/2020   Iron deficiency anemia due to chronic blood loss    Occult blood in stools    Unprovoked DVT and Unprovoked Pulmonary Embolism -Dxed 03/2019 04/03/2019   Sciatica of right side 10/18/2018   Seborrheic keratosis 10/17/2016   Hyperlipidemia associated with type 2 diabetes mellitus ([place]) 10/17/2016  Overactive bladder 10/17/2016   Gastroesophageal reflux disease without esophagitis 10/17/2016   Primary osteoarthritis involving multiple joints 10/17/2016   Chronic gout without tophus 10/17/2016   Type 2 diabetes mellitus without complication, without long-term current use of insulin ([place]) 05/16/2016   Essential hypertension 05/16/2016   Hypothyroidism 05/16/2016   Morbid obesity ([place]) 10/30/2013   S/P right THA, AA 10/29/2013    Conditions to be  addressed/monitored:Atrial Fibrillation and HTN  Care Plan : [place]  Updates made by Ilean China, RN since 02/04/2021 12:00 AM     Problem: Chronic Disease Management Needs (DM, HTN, HLD, Afib, chronic anticoagulation)   Priority: Medium     Long-Range Goal: Work with [place] Coordination Regarding Chronic Medical Conditions   Start Date: 02/02/2021  Recent Progress: On track  Priority: Medium  Note:   Current Barriers:  Chronic Disease Management support and education needs related to Atrial Fibrillation, HTN, HLD, and DMII  RNCM Clinical Goal(s):  Patient will work with RN Case Manager to address needs related to Atrial Fibrillation, HTN, HLD, and DMII and recent hospitalization for septic shock secondary to kidney stone and UTI demonstrate ongoing self health care management ability through collaboration with RN Care manager, provider, and care team.   Interventions: 1:1 collaboration with Loman Brooklyn, FNP regarding development and update of comprehensive plan of care as evidenced by provider attestation and co-signature  Inter-disciplinary care team collaboration (see longitudinal plan of care) Reviewed chart including relevant office notes, lab results, and consultation reports   Diabetes:  (Status: Goal on track: YES. No changes this visit.) Lab Results  Component Value Date   HGBA1C 5.7 (H) 12/25/2020  Assessed patient's understanding of A1c goal: <7% Reviewed and discussed most recent A1C Reviewed medications with patient and discussed importance of medication adherence; Counseled on importance of regular laboratory monitoring as prescribed; Discussed plans with patient for ongoing care management follow up and provided patient with direct contact information for care management team; Reviewed scheduled/upcoming provider appointments including: Lottie Dawson, PharmD on 02/12/21 for prescription assistance; Encouraged to continue to  follow an ADA/carb modified diet  Hypertension: (Status: Goal on track: YES. No changes this visit.) Last practice recorded BP readings:  BP Readings from Last 3 Encounters:  01/21/21 122/79  01/15/21 (!) 146/100  01/11/21 (!) 145/85  Most recent eGFR/CrCl:  Lab Results  Component Value Date   EGFR 59 (L) 01/05/2021    No components found for: CRCL  Evaluation of current treatment plan related to hypertension self management and patient's adherence to plan as established by provider; Reviewed medications with patient and discussed importance of compliance; Discussed plans with patient for ongoing care management follow up and provided patient with direct contact information for care management team; Advised patient, providing education and rationale, to monitor blood pressure daily and record, calling PCP for findings outside established parameters;  Reviewed scheduled/upcoming provider appointments including:  04/03/21 with PCP  Reviewed and discussed recent office visit with cardiologist  Questioned mobility and physical activity level. Both are improving. Physical and occupational therapy have ended. Continues to use a walker from time to time but is mostly ambulating on her own without difficulty. She is able to perform ADLs independently.  Discussed oxygen use. Patient is not using oxygen and is waiting for supplier to pick it up. O2 levels have been normal during the day and night.   HDL  (Status: Goal on track: YES. No changes this visit.) Evaluation of current  treatment plan related to HLD self-management and patient's adherence to plan as established by provider. Discussed plans with patient for ongoing care management follow up and provided patient with direct contact information for care management team Reviewed and discussed recent lab results Reviewed and discussed medications and compliance Encouraged patient to continue increasing physical activity level as  tolerated  A-fib:  (Status: Goal on track: YES.) Counseled on increased risk of stroke due to Afib and benefits of anticoagulation for stroke prevention; Reviewed importance of adherence to anticoagulant exactly as prescribed; Assessed social determinant of health barriers;  Cost of eliquis. Scheduled to see PharmD. Reviewed chart for urology office notes and lab results. Office notes from 7/5 scanned in and reviewed. Did not find lab results. Collaborated with PCP and her nurse and they do not recall seeing them.   Requested lab results from 01/26/21 visit with urologist [phone]). Per patient, parathyroid hormone was elevated. No other abnormalities that she is aware of. 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  Discussed use of Eliquis 95m for anticoagulation since dx of Afib. Previously was on 2.5 mg for hx of DVT and PE with unkown cause.  Talked with patient today regarding new concern. Per patient, she has had multiple episodes of falling asleep while sitting down. She doesn't necessarily feel very tired, but non-the-less she'll find herself waking up and feeling short of breath. She does check her pulse and O2 each time and O2 is WNL while pulse is generally between 100-120.  [facility] and advised to be on the lookout for lab results form [facility] and to pass them on to PCP for review  RNCM to collaborate with PCP and cardiologist regarding these new episodes of "falling asleep" Advised patient to seek appropriate medical attention for any new or worsening symptoms Previousy provided patient with RNCM contact number and reminded to reach out as needed  Patient Goals/Self-Care Activities: Patient will self administer medications as prescribed Patient will attend all scheduled provider appointments Patient will continue to perform ADL's independently Patient will call provider office for new concerns or  questions  Follow Up Plan:  Telephone follow up appointment with care management team member scheduled for: 02/08/21 for acute follow-up on Afib and  03/18/21 with RNCM for routine followup The patient has been provided with contact information for the care management team and has been advised to call with any health related questions or concerns.       Plan:Telephone follow up appointment with care management team member scheduled for:  02/08/21 with RNCM  KChong Sicilian BSN, RN-BC [place] Direct Dial: [phone]

## 2021-02-04 NOTE — Telephone Encounter (Signed)
-----   Message from Minus Breeding, MD sent at 02/04/2021  4:52 PM EDT ----- Regarding: RE: AFib, Elev PTH, and possible syncopal episodes Please have the patient monitor her HR with pulse ox 3 x per day while resting and let us see the readings for about 3 - 4 days.  ----- Message ----- From: Ilean China, RN Sent: 02/04/2021   3:31 PM EDT To: Minus Breeding, MD Subject: AFib, Elev PTH, and possible syncopal episod#  Hi Dr Percival Spanish,  I'm working with mutual patient, Millianna Szymborski, and she called me today with some new symptoms. Describes multiple episodes where she doesn't necessarily feel tired, but she'll fall asleep while sitting, and wake up feeling short of breath. Reports O2 is normal and pulse is between 100-120. Most recent episode was this morning.   She was told by provider at [facility] that her PTH was elevated. Per patient she has a history of benign parathyroid tumor that was removed in 1999. Reviewed labs from 7/5 that I requested from [facility] and her PTH was elevated at 158. Calcium was 10.2.   I'm sharing all of this information with her PCP but wanted to make you aware as well in case there is a cardiac component to these episodes as well as whether the PTH is affecting her heart rate/rhythm.   Let me know if I can be of any assistance.   Thank you,  Chong Sicilian, BSN, RN-BC [place] / [place] Management Direct Dial: [phone]

## 2021-02-04 NOTE — Telephone Encounter (Signed)
Advised patient, verbalized understanding  

## 2021-02-04 NOTE — Patient Instructions (Signed)
Visit Information  PATIENT GOALS:  Goals Addressed             This Visit's Progress    Track and Manage Heart Rate and Rhythm-Atrial Fibrillation       Timeframe:  Long-Range Goal Priority:  High Start Date:   02/04/21                          Expected End Date:    02/04/22                   Follow Up Date 02/08/21    Keep a symptoms diary Check and record pulse rate daily and as needed Keep all medical appointments Take medications as prescribed Talk with PharmD about Eliquis prescription assistance Call PCP or cardiologist with any new or worsening symptoms Seek appropriate medical attention for any new or worsening symptoms Call [facility] as needed [phone]    Why is this important?   Atrial fibrillation may have no symptoms. Sometimes the symptoms get worse or happen more often.  It is important to keep track of what your symptoms are and when they happen.  A change in symptoms is important to discuss with your doctor or nurse.  Being active and healthy eating will also help you manage your heart condition.     Notes:         Patient verbalizes understanding of instructions provided today and agrees to view in [place].   Telephone follow up appointment with care management team member scheduled for: 02/08/21 with RNCM  Chong Sicilian, BSN, RN-BC [place] / [place] Management Direct Dial: [phone]

## 2021-02-06 ENCOUNTER — Inpatient Hospital Stay (HOSPITAL_COMMUNITY)
Admission: EM | Admit: 2021-02-06 | Discharge: 2021-02-10 | DRG: 308 | Disposition: A | Discharge status: Home / Self Care | Payer: Medicare Other | Attending: Internal Medicine | Admitting: Internal Medicine

## 2021-02-06 ENCOUNTER — Other Ambulatory Visit: Payer: Self-pay

## 2021-02-06 ENCOUNTER — Emergency Department (HOSPITAL_COMMUNITY): Payer: Medicare Other

## 2021-02-06 ENCOUNTER — Encounter (HOSPITAL_COMMUNITY): Payer: Self-pay

## 2021-02-06 DIAGNOSIS — R0602 Shortness of breath: Secondary | ICD-10-CM | POA: Diagnosis not present

## 2021-02-06 DIAGNOSIS — K219 Gastro-esophageal reflux disease without esophagitis: Secondary | ICD-10-CM | POA: Diagnosis present

## 2021-02-06 DIAGNOSIS — J810 Acute pulmonary edema: Secondary | ICD-10-CM | POA: Diagnosis not present

## 2021-02-06 DIAGNOSIS — J9601 Acute respiratory failure with hypoxia: Secondary | ICD-10-CM | POA: Diagnosis not present

## 2021-02-06 DIAGNOSIS — I472 Ventricular tachycardia: Secondary | ICD-10-CM | POA: Diagnosis not present

## 2021-02-06 DIAGNOSIS — Z86711 Personal history of pulmonary embolism: Secondary | ICD-10-CM | POA: Diagnosis not present

## 2021-02-06 DIAGNOSIS — Z20822 Contact with and (suspected) exposure to covid-19: Secondary | ICD-10-CM | POA: Diagnosis not present

## 2021-02-06 DIAGNOSIS — E039 Hypothyroidism, unspecified: Secondary | ICD-10-CM | POA: Diagnosis not present

## 2021-02-06 DIAGNOSIS — R32 Unspecified urinary incontinence: Secondary | ICD-10-CM | POA: Diagnosis present

## 2021-02-06 DIAGNOSIS — I110 Hypertensive heart disease with heart failure: Secondary | ICD-10-CM | POA: Diagnosis present

## 2021-02-06 DIAGNOSIS — I5021 Acute systolic (congestive) heart failure: Secondary | ICD-10-CM | POA: Diagnosis present

## 2021-02-06 DIAGNOSIS — E876 Hypokalemia: Secondary | ICD-10-CM | POA: Diagnosis present

## 2021-02-06 DIAGNOSIS — I5043 Acute on chronic combined systolic (congestive) and diastolic (congestive) heart failure: Secondary | ICD-10-CM | POA: Diagnosis present

## 2021-02-06 DIAGNOSIS — R0902 Hypoxemia: Secondary | ICD-10-CM

## 2021-02-06 DIAGNOSIS — I4891 Unspecified atrial fibrillation: Secondary | ICD-10-CM | POA: Diagnosis not present

## 2021-02-06 DIAGNOSIS — N289 Disorder of kidney and ureter, unspecified: Secondary | ICD-10-CM

## 2021-02-06 DIAGNOSIS — Z7901 Long term (current) use of anticoagulants: Secondary | ICD-10-CM

## 2021-02-06 DIAGNOSIS — E1169 Type 2 diabetes mellitus with other specified complication: Secondary | ICD-10-CM | POA: Diagnosis present

## 2021-02-06 DIAGNOSIS — N179 Acute kidney failure, unspecified: Secondary | ICD-10-CM | POA: Diagnosis not present

## 2021-02-06 DIAGNOSIS — I509 Heart failure, unspecified: Secondary | ICD-10-CM

## 2021-02-06 DIAGNOSIS — I10 Essential (primary) hypertension: Secondary | ICD-10-CM | POA: Diagnosis present

## 2021-02-06 DIAGNOSIS — I2699 Other pulmonary embolism without acute cor pulmonale: Secondary | ICD-10-CM | POA: Diagnosis present

## 2021-02-06 DIAGNOSIS — R600 Localized edema: Secondary | ICD-10-CM | POA: Diagnosis not present

## 2021-02-06 DIAGNOSIS — Z87442 Personal history of urinary calculi: Secondary | ICD-10-CM

## 2021-02-06 DIAGNOSIS — I517 Cardiomegaly: Secondary | ICD-10-CM | POA: Diagnosis not present

## 2021-02-06 DIAGNOSIS — I700 Atherosclerosis of aorta: Secondary | ICD-10-CM | POA: Diagnosis present

## 2021-02-06 DIAGNOSIS — I482 Chronic atrial fibrillation, unspecified: Secondary | ICD-10-CM

## 2021-02-06 DIAGNOSIS — M109 Gout, unspecified: Secondary | ICD-10-CM | POA: Diagnosis present

## 2021-02-06 DIAGNOSIS — E875 Hyperkalemia: Secondary | ICD-10-CM | POA: Diagnosis not present

## 2021-02-06 DIAGNOSIS — E119 Type 2 diabetes mellitus without complications: Secondary | ICD-10-CM

## 2021-02-06 DIAGNOSIS — M19042 Primary osteoarthritis, left hand: Secondary | ICD-10-CM | POA: Diagnosis present

## 2021-02-06 DIAGNOSIS — D649 Anemia, unspecified: Secondary | ICD-10-CM | POA: Diagnosis present

## 2021-02-06 DIAGNOSIS — J811 Chronic pulmonary edema: Secondary | ICD-10-CM | POA: Diagnosis not present

## 2021-02-06 DIAGNOSIS — I081 Rheumatic disorders of both mitral and tricuspid valves: Secondary | ICD-10-CM | POA: Diagnosis present

## 2021-02-06 DIAGNOSIS — M160 Bilateral primary osteoarthritis of hip: Secondary | ICD-10-CM | POA: Diagnosis present

## 2021-02-06 DIAGNOSIS — I340 Nonrheumatic mitral (valve) insufficiency: Secondary | ICD-10-CM | POA: Diagnosis not present

## 2021-02-06 DIAGNOSIS — I4819 Other persistent atrial fibrillation: Secondary | ICD-10-CM | POA: Diagnosis not present

## 2021-02-06 DIAGNOSIS — I493 Ventricular premature depolarization: Secondary | ICD-10-CM | POA: Diagnosis present

## 2021-02-06 DIAGNOSIS — E785 Hyperlipidemia, unspecified: Secondary | ICD-10-CM | POA: Diagnosis not present

## 2021-02-06 DIAGNOSIS — Z79899 Other long term (current) drug therapy: Secondary | ICD-10-CM

## 2021-02-06 DIAGNOSIS — J90 Pleural effusion, not elsewhere classified: Secondary | ICD-10-CM | POA: Diagnosis not present

## 2021-02-06 DIAGNOSIS — Z8249 Family history of ischemic heart disease and other diseases of the circulatory system: Secondary | ICD-10-CM

## 2021-02-06 DIAGNOSIS — Z90710 Acquired absence of both cervix and uterus: Secondary | ICD-10-CM

## 2021-02-06 DIAGNOSIS — R000 Tachycardia, unspecified: Secondary | ICD-10-CM | POA: Diagnosis not present

## 2021-02-06 DIAGNOSIS — Z86718 Personal history of other venous thrombosis and embolism: Secondary | ICD-10-CM

## 2021-02-06 DIAGNOSIS — I5020 Unspecified systolic (congestive) heart failure: Secondary | ICD-10-CM

## 2021-02-06 DIAGNOSIS — I5031 Acute diastolic (congestive) heart failure: Secondary | ICD-10-CM | POA: Diagnosis not present

## 2021-02-06 DIAGNOSIS — R609 Edema, unspecified: Secondary | ICD-10-CM

## 2021-02-06 DIAGNOSIS — Z6835 Body mass index (BMI) 35.0-35.9, adult: Secondary | ICD-10-CM | POA: Diagnosis not present

## 2021-02-06 DIAGNOSIS — I5041 Acute combined systolic (congestive) and diastolic (congestive) heart failure: Secondary | ICD-10-CM | POA: Diagnosis not present

## 2021-02-06 DIAGNOSIS — N1832 Chronic kidney disease, stage 3b: Secondary | ICD-10-CM

## 2021-02-06 DIAGNOSIS — Z96641 Presence of right artificial hip joint: Secondary | ICD-10-CM | POA: Diagnosis present

## 2021-02-06 DIAGNOSIS — M170 Bilateral primary osteoarthritis of knee: Secondary | ICD-10-CM | POA: Diagnosis present

## 2021-02-06 DIAGNOSIS — Z79890 Hormone replacement therapy (postmenopausal): Secondary | ICD-10-CM

## 2021-02-06 DIAGNOSIS — M19041 Primary osteoarthritis, right hand: Secondary | ICD-10-CM | POA: Diagnosis present

## 2021-02-06 DIAGNOSIS — I152 Hypertension secondary to endocrine disorders: Secondary | ICD-10-CM | POA: Diagnosis present

## 2021-02-06 LAB — CBC
HCT: 27.9 % — ABNORMAL LOW (ref 36.0–46.0)
Hemoglobin: 8.5 g/dL — ABNORMAL LOW (ref 12.0–15.0)
MCH: 28.4 pg (ref 26.0–34.0)
MCHC: 30.5 g/dL (ref 30.0–36.0)
MCV: 93.3 fL (ref 80.0–100.0)
Platelets: 299 10*3/uL (ref 150–400)
RBC: 2.99 MIL/uL — ABNORMAL LOW (ref 3.87–5.11)
RDW: 15 % (ref 11.5–15.5)
WBC: 7 10*3/uL (ref 4.0–10.5)
nRBC: 0 % (ref 0.0–0.2)

## 2021-02-06 LAB — BASIC METABOLIC PANEL
Anion gap: 9 (ref 5–15)
BUN: 32 mg/dL — ABNORMAL HIGH (ref 8–23)
CO2: 22 mmol/L (ref 22–32)
Calcium: 10.7 mg/dL — ABNORMAL HIGH (ref 8.9–10.3)
Chloride: 104 mmol/L (ref 98–111)
Creatinine, Ser: 1.43 mg/dL — ABNORMAL HIGH (ref 0.44–1.00)
GFR, Estimated: 37 mL/min — ABNORMAL LOW (ref >60)
Glucose, Bld: 149 mg/dL — ABNORMAL HIGH (ref 70–99)
Potassium: 3.8 mmol/L (ref 3.5–5.1)
Sodium: 135 mmol/L (ref 135–145)

## 2021-02-06 LAB — PROTIME-INR
INR: 1.5 — ABNORMAL HIGH (ref 0.8–1.2)
Prothrombin Time: 18 seconds — ABNORMAL HIGH (ref 11.4–15.2)

## 2021-02-06 MED ORDER — FUROSEMIDE 10 MG/ML IJ SOLN
INTRAMUSCULAR | Status: AC
  Administered 2021-02-06: 40 mg via INTRAVENOUS
  Filled 2021-02-06: qty 4

## 2021-02-06 MED ORDER — METOPROLOL TARTRATE 5 MG/5ML IV SOLN
5.0000 mg | Freq: Once | INTRAVENOUS | Status: AC
  Administered 2021-02-06: 5 mg via INTRAVENOUS
  Filled 2021-02-06: qty 5

## 2021-02-06 MED ORDER — FUROSEMIDE 10 MG/ML IJ SOLN: 40.0000 mg | Freq: Once | INTRAMUSCULAR | Status: AC

## 2021-02-06 MED ORDER — ETOMIDATE 2 MG/ML IV SOLN
0.1500 mg/kg | Freq: Once | INTRAVENOUS | Status: AC
  Administered 2021-02-06: 13.96 mg via INTRAVENOUS
  Filled 2021-02-06: qty 10

## 2021-02-06 MED ORDER — SODIUM CHLORIDE 0.9 % IV BOLUS
500.0000 mL | Freq: Once | INTRAVENOUS | Status: AC
  Administered 2021-02-06: 500 mL via INTRAVENOUS

## 2021-02-06 MED ORDER — ETOMIDATE 2 MG/ML IV SOLN: INTRAVENOUS | Status: AC | PRN

## 2021-02-06 MED ORDER — SODIUM CHLORIDE 0.9 % IV SOLN
INTRAVENOUS | Status: AC | PRN
  Administered 2021-02-06: 10 mL/h via INTRAVENOUS

## 2021-02-06 NOTE — ED Provider Notes (Signed)
[facility] DEPT Provider Note   CSN: [id number] Arrival date & time: 02/06/21  2113     History Chief Complaint  Patient presents with  . Shortness of Breath  . Palpitations    [name] [name] is a 80 y.o. female.  HPI She presents for palpitations and documented heart rate between 120 and 160 at home.  She has atrial fibrillation.  She takes Eliquis, 5 mg, twice a day.  She reports recently having more leg swelling than usual.  She is taking all of her usual medications.  She recently had kidney stone removal and has been seeing a urologist.  Has chest pain, syncope, fever, chills, focal weakness or paresthesia.  She gets out of breath with walking, now.  There are no other known modifying factors.    Past Medical History:  Diagnosis Date  . Anemia   . Arthritis    Ostearthritis- hips, knees, fingers  . Diabetes mellitus without complication ([place])   . DVT (deep venous thrombosis) ([place])   . Dyspnea   . GERD (gastroesophageal reflux disease)   . Headache(784.0)    tx. Valproic acid  . History of kidney stones   . Hypertension   . Hypothyroidism   . Pulmonary embolism [facility])     Patient Active Problem List   Diagnosis Date Noted  . A-fib ([place]) 02/04/2021  . Educated about COVID-19 virus infection 02/02/2020  . Iron deficiency anemia due to chronic blood loss   . Occult blood in stools   . Unprovoked DVT and Unprovoked Pulmonary Embolism -Dxed 03/2019 04/03/2019  . Sciatica of right side 10/18/2018  . Seborrheic keratosis 10/17/2016  . Hyperlipidemia associated with type 2 diabetes mellitus ([place]) 10/17/2016  . Overactive bladder 10/17/2016  . Gastroesophageal reflux disease without esophagitis 10/17/2016  . Primary osteoarthritis involving multiple joints 10/17/2016  . Chronic gout without tophus 10/17/2016  . Type 2 diabetes mellitus without complication, without long-term current use of insulin ([place]) 05/16/2016  . Essential  hypertension 05/16/2016  . Hypothyroidism 05/16/2016  . Morbid obesity ([place]) 10/30/2013  . S/P right THA, AA 10/29/2013    Past Surgical History:  Procedure Laterality Date  . ABDOMINAL HYSTERECTOMY    . BILATERAL HIP ARTHROSCOPY Left   . BIOPSY  12/08/2019   Procedure: BIOPSY;  Surgeon: Daneil Dolin, MD;  Location[facility];  Service: Endoscopy;;  . CATARACT EXTRACTION, BILATERAL    . COLONOSCOPY N/A 12/08/2019   polyps (tubular adenoma), diverticulosis, colonic lipoma, no surveillance due to age  . CYSTOSCOPY W/ URETERAL STENT PLACEMENT Right 12/25/2020   Procedure: CYSTOSCOPY WITH RETROGRADE PYELOGRAM/URETERAL STENT PLACEMENT;  Surgeon: Janith Lima, MD;  Location: [facility] ORS;  Service: Urology;  Laterality: Right;  . CYSTOSCOPY/URETEROSCOPY/HOLMIUM LASER/STENT PLACEMENT Right 01/15/2021   Procedure: CYSTOSCOPY/RETROGRADE/URETEROSCOPY/HOLMIUM LASER/STENT EXCHANGE;  Surgeon: Janith Lima, MD;  Location: [facility];  Service: Urology;  Laterality: Right;  . 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  [name] N/A 01/13/2020   Procedure: GIVENS CAPSULE STUDY;  Surgeon: Daneil Dolin, MD;  Location[facility];  Service: Endoscopy;  Laterality: N/A;  7:30am  . MULTIPLE TOOTH EXTRACTIONS     60's  . PARATHYROIDECTOMY    . POLYPECTOMY  12/08/2019   Procedure: POLYPECTOMY;  Surgeon: Daneil Dolin, MD;  Location: [facility];  Service: Endoscopy;;  . TOTAL HIP ARTHROPLASTY Right 10/29/2013   Procedure: RIGHT TOTAL HIP ARTHROPLASTY ANTERIOR  APPROACH;  Surgeon: Mauri Pole, MD;  Location: [facility];  Service: Orthopedics;  Laterality: Right;     OB History   No obstetric history on file.     Family History  Problem Relation Age of Onset  . Colitis Sister 60       alive  . Cancer Sister 29       colon  . Cancer Mother 69       uterine  . Heart disease Father  67       heart failure  . Heart attack Brother 99  . Healthy Daughter   . Healthy Son   . Pulmonary embolism Brother 63  . Heart disease Brother   . Healthy Son   . Healthy Son     Social History   Tobacco Use  . Smoking status: Never  . Smokeless tobacco: Never  Vaping Use  . Vaping Use: Never used  Substance Use Topics  . Alcohol use: No    Alcohol/week: 0.0 standard drinks  . Drug use: No    Home Medications Prior to Admission medications   Medication Sig Start Date End Date Taking? Authorizing Provider  acetaminophen (TYLENOL) 325 MG tablet Take 2 tablets (650 mg total) by mouth every 6 (six) hours as needed for mild pain or headache (or Fever >/= 101). 04/04/19   Roxan Hockey, MD  allopurinol (ZYLOPRIM) 100 MG tablet Take 1 tablet (100 mg total) by mouth daily. 01/21/21   Loman Brooklyn, FNP  apixaban (ELIQUIS) 5 MG TABS tablet Take 1 tablet (5 mg total) by mouth 2 (two) times daily. 01/08/21   Minus Breeding, MD  atorvastatin (LIPITOR) 20 MG tablet Take 1 tablet (20 mg total) by mouth daily. 12/31/20   Bonnielee Haff, MD  Cholecalciferol (VITAMIN D-3) 5000 UNITS TABS Take 5,000 Units by mouth daily.    [provider]  diclofenac Sodium (VOLTAREN) 1 % GEL APPLY 4 GRAMS TOPICALLY 4  TIMES DAILY 01/26/21   Loman Brooklyn, FNP  diltiazem (CARTIA XT) 240 MG 24 hr capsule Take 1 capsule (240 mg total) by mouth daily. 12/30/20 03/30/21  Bonnielee Haff, MD  docusate sodium (COLACE) 100 MG capsule Take 1 capsule (100 mg total) by mouth daily as needed for up to 30 doses. 01/15/21   Janith Lima, MD  EUTHYROX 50 MCG tablet TAKE 1 TABLET BY MOUTH ONCE DAILY BEFORE BREAKFAST Patient taking differently: Take 50 mcg by mouth daily before breakfast. 09/14/20   Claretta Fraise, MD  ferrous sulfate 325 (65 FE) MG tablet Take 325 mg by mouth daily with breakfast.    [provider]  furosemide (LASIX) 20 MG tablet TAKE 1 TABLET BY MOUTH  TWICE DAILY Patient taking  differently: Take 20 mg by mouth 2 (two) times daily. 10/22/20   Loman Brooklyn, FNP  hydrocortisone cream 0.5 % Apply topically 2 (two) times daily. Patient taking differently: Apply 1 application topically daily. 12/30/20   Bonnielee Haff, MD  loratadine (CLARITIN) 10 MG tablet Take 10 mg by mouth daily.    [provider]  ONE TOUCH ULTRA TEST test strip USE UP TO FOUR TIMES DAILY AS DIRECTED 10/04/17   Terald Sleeper, PA-C  oxybutynin (DITROPAN-XL) 10 MG 24 hr tablet Take 1 tablet (10 mg total) by mouth at bedtime. 11/23/20   Loman Brooklyn, FNP  pantoprazole (PROTONIX) 40 MG tablet Take 1 tablet (40 mg total) by mouth 2 (two) times daily before a meal. 11/16/20   Blanch Media,  [name], FNP    Allergies    Patient has no known allergies.  Review of Systems   Review of Systems  All other systems reviewed and are negative.  Physical Exam Updated Vital Signs BP (!) 162/110 (BP Location: Right Arm)   Pulse 100   Temp 98 F (36.7 C) (Oral)   Resp 20   Ht 5\' 3"  (1.6 m)   Wt 93 kg   SpO2 99%   BMI 36.31 kg/m   Physical Exam Vitals and nursing note reviewed.  Constitutional:      General: She is not in acute distress.    Appearance: She is well-developed. She is not toxic-appearing or diaphoretic.  HENT:     Head: Normocephalic and atraumatic.     Nose: No congestion or rhinorrhea.     Mouth/Throat:     Pharynx: No oropharyngeal exudate.  Eyes:     Conjunctiva/sclera: Conjunctivae normal.     Pupils: Pupils are equal, round, and reactive to light.  Neck:     Trachea: Phonation normal.  Cardiovascular:     Rate and Rhythm: Tachycardia present. Rhythm irregular.  Pulmonary:     Effort: Pulmonary effort is normal.     Breath sounds: Normal breath sounds.  Chest:     Chest wall: No tenderness.  Abdominal:     General: There is no distension.     Palpations: Abdomen is soft.     Tenderness: There is no abdominal tenderness. There is no guarding.  Musculoskeletal:         General: Swelling present. Normal range of motion.     Cervical back: Normal range of motion and neck supple.     Right lower leg: Edema present.     Left lower leg: Edema present.  Skin:    General: Skin is warm and dry.  Neurological:     Mental Status: She is alert and oriented to person, place, and time.     Motor: No abnormal muscle tone.  Psychiatric:        Mood and Affect: Mood normal.        Behavior: Behavior normal.        Thought Content: Thought content normal.        Judgment: Judgment normal.    ED Results / Procedures / Treatments   Labs (all labs ordered are listed, but only abnormal results are displayed) Labs Reviewed  BASIC METABOLIC PANEL - Abnormal; Notable for the following components:      Result Value   Glucose, Bld 149 (*)    BUN 32 (*)    Creatinine, Ser 1.43 (*)    Calcium 10.7 (*)    GFR, Estimated 37 (*)    All other components within normal limits  CBC - Abnormal; Notable for the following components:   RBC 2.99 (*)    Hemoglobin 8.5 (*)    HCT 27.9 (*)    All other components within normal limits  PROTIME-INR - Abnormal; Notable for the following components:   Prothrombin Time 18.0 (*)    INR 1.5 (*)    All other components within normal limits  RESP PANEL BY RT-PCR (FLU A&B, COVID) [id number]    EKG EKG Interpretation  Date/Time:  Saturday February 06 2021 21:23:15 EDT Ventricular Rate:  145 PR Interval:    QRS Duration: 93 QT Interval:  281 QTC Calculation: 437 R Axis:   12 Text Interpretation: Atrial fibrillation with rapid V-rate Ventricular premature complex Repolarization abnormality, prob rate  related Since last tracing rate faster and pvc is present Otherwise no significant change Confirmed by Daleen Bo [phone]) on 02/06/2021 10:19:46 PM   EKG Interpretation  Date/Time:  Saturday February 06 2021 22:40:11 EDT Ventricular Rate:  116 PR Interval:    QRS Duration: 94 QT Interval:  330 QTC Calculation: 459 R Axis:   20 Text  Interpretation: Atrial fibrillation Nonspecific T abnormalities, lateral leads Since last tracing rate slower Otherwise no significant change Confirmed by Daleen Bo [phone]) on 02/06/2021 10:51:21 PM        EKG Interpretation  Date/Time:  Saturday February 06 2021 23:35:11 EDT Ventricular Rate:  96 PR Interval:  195 QRS Duration: 102 QT Interval:  360 QTC Calculation: 455 R Axis:   18 Text Interpretation: Sinus rhythm Ventricular premature complex Probable left atrial enlargement Nonspecific T abnormalities, lateral leads Since last tracing of earlier today now in Normal sinus rhythm Otherwise no significant change Confirmed by Daleen Bo [phone]) on 02/06/2021 11:39:41 PM          Radiology DG Chest 2 View  Result Date: 02/06/2021 CLINICAL DATA:  Shortness of breath and tachycardia. EXAM: CHEST - 2 VIEW COMPARISON:  December 30, 2020 FINDINGS: Enlarged cardiac silhouette. Small bilateral pleural effusions. Interstitial pulmonary edema. Calcific atherosclerotic disease and tortuosity of the aorta. Osseous structures are without acute abnormality. Soft tissues are grossly normal. IMPRESSION: Enlarged cardiac silhouette with interstitial pulmonary edema and small bilateral pleural effusions. Electronically Signed   By: Fidela Salisbury M.D.   On: 02/06/2021 22:00    Procedures .Sedation  Date/Time: 02/06/2021 11:44 PM Performed by: Daleen Bo, MD Authorized by: Daleen Bo, MD   Consent:    Consent obtained:  Written   Consent given by:  Patient   Risks discussed:  Prolonged hypoxia resulting in organ damage, inadequate sedation, nausea and vomiting   Alternatives discussed:  Analgesia without sedation Universal protocol:    Immediately prior to procedure, a time out was called: yes   Indications:    Procedure performed:  Cardioversion   Procedure necessitating sedation performed by:  Physician performing sedation Pre-sedation assessment:    Time since last food or  drink:  5 hr   ASA classification: class 3 - patient with severe systemic disease     Mouth opening:  2 finger widths   Mallampati score:  II - soft palate, uvula, fauces visible   Neck mobility: normal     Pre-sedation assessments completed and reviewed: airway patency, cardiovascular function, hydration status, mental status and respiratory function     Pre-sedation assessments completed and reviewed: pre-procedure nausea and vomiting status not reviewed and pre-procedure pain level not reviewed     Pre-sedation assessment completed:  02/06/2021 11:25 PM Immediate pre-procedure details:    Reassessment: Patient reassessed immediately prior to procedure     Reviewed: vital signs     Verified: bag valve mask available, emergency equipment available, intubation equipment available, IV patency confirmed and oxygen available   Procedure details (see MAR for exact dosages):    Preoxygenation:  Nasal cannula   Sedation:  Etomidate   Intended level of sedation: deep   Analgesia:  None   Intra-procedure monitoring:  Blood pressure monitoring, cardiac monitor, continuous capnometry, continuous pulse oximetry and frequent LOC assessments   Intra-procedure events: none     Total Provider sedation time (minutes):  22 Post-procedure details:    Post-sedation assessment completed:  02/06/2021 11:52 PM   Attendance: Constant attendance by certified staff until patient recovered  Recovery: Patient returned to pre-procedure baseline     Post-sedation assessments completed and reviewed: airway patency, cardiovascular function, hydration status, mental status and respiratory function     Post-sedation assessments completed and reviewed: post-procedure nausea and vomiting status not reviewed and pain score not reviewed     Patient is stable for discharge or admission: yes     Procedure completion:  Tolerated well, no immediate complications .Cardioversion  Date/Time: 02/06/2021 11:44 PM Performed by:  Daleen Bo, MD Authorized by: Daleen Bo, MD   Consent:    Consent obtained:  Written   Consent given by:  Patient   Alternatives discussed:  No treatment Pre-procedure details:    Cardioversion basis:  Elective   Rhythm:  Atrial fibrillation Patient sedated: Yes. Refer to sedation procedure documentation for details of sedation.  Attempt one:    Cardioversion mode:  Synchronous   Waveform:  Biphasic   Shock (Joules):  120   Shock outcome:  No change in rhythm Attempt two:    Cardioversion mode:  Synchronous   Waveform:  Biphasic   Shock (Joules):  200   Shock outcome:  Conversion to normal sinus rhythm Post-procedure details:    Patient status:  Awake   Patient tolerance of procedure:  Tolerated well, no immediate complications .Critical Care  Date/Time: 02/06/2021 11:44 PM Performed by: Daleen Bo, MD Authorized by: Daleen Bo, MD   Critical care provider statement:    Critical care time (minutes):  45   Critical care start time:  02/06/2021 9:30 PM   Critical care end time:  02/06/2021 11:54 PM   Critical care time was exclusive of:  Separately billable procedures and treating other patients   Critical care was necessary to treat or prevent imminent or life-threatening deterioration of the following conditions:  Cardiac failure   Critical care was time spent personally by me on the following activities:  Blood draw for specimens, development of treatment plan with patient or surrogate, discussions with consultants, evaluation of patient's response to treatment, examination of patient, obtaining history from patient or surrogate, ordering and performing treatments and interventions, ordering and review of laboratory studies, pulse oximetry, re-evaluation of patient's condition, review of old charts and ordering and review of radiographic studies   Medications Ordered in ED Medications  etomidate (AMIDATE) injection (13.96 mg Intravenous Not Given 02/06/21 2349)   sodium chloride 0.9 % bolus 500 mL (0 mLs Intravenous Stopped 02/06/21 2243)  metoprolol tartrate (LOPRESSOR) injection 5 mg (5 mg Intravenous Given 02/06/21 2152)  etomidate (AMIDATE) injection 13.96 mg (13.96 mg Intravenous Given 02/06/21 2327)  furosemide (LASIX) injection 40 mg (40 mg Intravenous Given 02/06/21 2337)  0.9 %  sodium chloride infusion (10 mL/hr Intravenous New Bag/Given 02/06/21 2320)    ED Course  I have reviewed the triage vital signs and the nursing notes.  Pertinent labs & imaging results that were available during my care of the patient were reviewed by me and considered in my medical decision making (see chart for details).    MDM Rules/Calculators/A&P                           Patient Vitals for the past 24 hrs:  BP Temp Temp src Pulse Resp SpO2 Height Weight  02/06/21 2345 (!) 162/110 -- -- 100 20 99 % -- --  02/06/21 2340 (!) 157/105 -- -- 97 16 100 % -- --  02/06/21 2330 (!) 151/123 -- -- (!) 137 (!) 22 99 % -- --  02/06/21 2325 (!) 151/123 98 F (36.7 C) Oral (!) 137 20 99 % -- --  02/06/21 2230 (!) 158/113 -- -- 97 (!) 30 93 % -- --  02/06/21 2202 -- -- -- 100 18 94 % -- --  02/06/21 2200 (!) 156/113 -- -- (!) 112 (!) 27 94 % -- --  02/06/21 2155 -- -- -- -- -- -- 5\' 3"  (1.6 m) 93 kg  02/06/21 2155 -- -- -- 65 (!) 26 99 % -- --  02/06/21 2130 (!) 148/98 -- -- (!) 142 (!) 38 96 % -- --  02/06/21 2120 (!) 133/116 98.9 F (37.2 C) Oral (!) 165 (!) 32 97 % -- --    11:54 PM Reevaluation with update and discussion. After initial assessment and treatment, an updated evaluation reveals she is comfortable and states she has less heaviness in her chest at this time.  Findings discussed with patient, and her son, all questions were answered. Daleen Bo   Medical Decision Making:  This patient is presenting for evaluation of rapid atrial fibrillation, which does require a range of treatment options, and is a complaint that involves a high risk of morbidity and  mortality. The differential diagnoses include hemodynamic instability, metabolic disorder, acute infectious process, recurrent atrial fibrillation. I decided to review old records, and in summary elderly female with known atrial fibrillation, apparently was in atrial fibrillation when she last saw her cardiologist about a month ago.  They discussed cardioversion at that time..  I did not require additional historical information from anyone.  Clinical Laboratory Tests Ordered, included CBC and Metabolic panel. Review indicates normal except glucose high, BUN high, creatinine high, calcium high, GFR low, hemoglobin low. Radiologic Tests Ordered, included chest x-ray.  I independently Visualized: Radiograph images, which show pulmonary edema with small pleural effusion  Cardiac Monitor Tracing which shows rapid atrial fibrillation     Critical Interventions-clinical evaluation, laboratory testing, IV fluids, IV Lopressor, repeat EKG, reevaluation, sedation and cardioversion  After These Interventions, the Patient was reevaluated and was found converted to normal sinus rhythm.  Patient with fluid overload, pulmonary edema with small effusions.  She has significant peripheral edema from her baseline.  She was treated with a dose of IV Lasix.  Creatinine has bumped from baseline with significant lowering of GFR.  Will consult hospitalist for observation admission.  CRITICAL CARE-yes Performed by: Daleen Bo  Nursing Notes Reviewed/ Care Coordinated Applicable Imaging Reviewed Interpretation of Laboratory Data incorporated into ED treatment   11:44 PM-Consult complete with hospitalist. Patient case explained and discussed.  He agrees to admit patient for further evaluation and treatment. Call ended at 12:08 AM   Final Clinical Impression(s) / ED Diagnoses Final diagnoses:  Atrial fibrillation with RVR ([place])  Renal insufficiency  Acute pulmonary edema ([place])  Peripheral edema    Rx /  DC Orders ED Discharge Orders     None        Daleen Bo, MD 02/07/21 0008

## 2021-02-06 NOTE — ED Triage Notes (Signed)
Pt reports palpitations and SHOB for the past few days. Pt has hx of Afib. She also endorses increased bilateral leg swelling.

## 2021-02-07 ENCOUNTER — Inpatient Hospital Stay (HOSPITAL_COMMUNITY): Payer: Medicare Other

## 2021-02-07 DIAGNOSIS — Z6835 Body mass index (BMI) 35.0-35.9, adult: Secondary | ICD-10-CM | POA: Diagnosis not present

## 2021-02-07 DIAGNOSIS — I700 Atherosclerosis of aorta: Secondary | ICD-10-CM | POA: Diagnosis present

## 2021-02-07 DIAGNOSIS — I5041 Acute combined systolic (congestive) and diastolic (congestive) heart failure: Secondary | ICD-10-CM | POA: Diagnosis not present

## 2021-02-07 DIAGNOSIS — E876 Hypokalemia: Secondary | ICD-10-CM | POA: Diagnosis present

## 2021-02-07 DIAGNOSIS — E785 Hyperlipidemia, unspecified: Secondary | ICD-10-CM | POA: Diagnosis present

## 2021-02-07 DIAGNOSIS — Z87442 Personal history of urinary calculi: Secondary | ICD-10-CM | POA: Diagnosis not present

## 2021-02-07 DIAGNOSIS — I5031 Acute diastolic (congestive) heart failure: Secondary | ICD-10-CM | POA: Diagnosis not present

## 2021-02-07 DIAGNOSIS — Z20822 Contact with and (suspected) exposure to covid-19: Secondary | ICD-10-CM | POA: Diagnosis present

## 2021-02-07 DIAGNOSIS — R32 Unspecified urinary incontinence: Secondary | ICD-10-CM | POA: Diagnosis present

## 2021-02-07 DIAGNOSIS — Z86711 Personal history of pulmonary embolism: Secondary | ICD-10-CM | POA: Diagnosis not present

## 2021-02-07 DIAGNOSIS — I472 Ventricular tachycardia: Secondary | ICD-10-CM | POA: Diagnosis not present

## 2021-02-07 DIAGNOSIS — J810 Acute pulmonary edema: Secondary | ICD-10-CM | POA: Diagnosis not present

## 2021-02-07 DIAGNOSIS — E1169 Type 2 diabetes mellitus with other specified complication: Secondary | ICD-10-CM | POA: Diagnosis present

## 2021-02-07 DIAGNOSIS — I5021 Acute systolic (congestive) heart failure: Secondary | ICD-10-CM | POA: Diagnosis present

## 2021-02-07 DIAGNOSIS — I509 Heart failure, unspecified: Secondary | ICD-10-CM

## 2021-02-07 DIAGNOSIS — R0602 Shortness of breath: Secondary | ICD-10-CM | POA: Diagnosis present

## 2021-02-07 DIAGNOSIS — I5020 Unspecified systolic (congestive) heart failure: Secondary | ICD-10-CM

## 2021-02-07 DIAGNOSIS — R0902 Hypoxemia: Secondary | ICD-10-CM | POA: Diagnosis not present

## 2021-02-07 DIAGNOSIS — I081 Rheumatic disorders of both mitral and tricuspid valves: Secondary | ICD-10-CM | POA: Diagnosis present

## 2021-02-07 DIAGNOSIS — E039 Hypothyroidism, unspecified: Secondary | ICD-10-CM | POA: Diagnosis present

## 2021-02-07 DIAGNOSIS — I4891 Unspecified atrial fibrillation: Secondary | ICD-10-CM | POA: Diagnosis present

## 2021-02-07 DIAGNOSIS — M109 Gout, unspecified: Secondary | ICD-10-CM | POA: Diagnosis present

## 2021-02-07 DIAGNOSIS — N179 Acute kidney failure, unspecified: Secondary | ICD-10-CM | POA: Diagnosis present

## 2021-02-07 DIAGNOSIS — Z86718 Personal history of other venous thrombosis and embolism: Secondary | ICD-10-CM | POA: Diagnosis not present

## 2021-02-07 DIAGNOSIS — I110 Hypertensive heart disease with heart failure: Secondary | ICD-10-CM | POA: Diagnosis present

## 2021-02-07 DIAGNOSIS — I340 Nonrheumatic mitral (valve) insufficiency: Secondary | ICD-10-CM | POA: Diagnosis not present

## 2021-02-07 DIAGNOSIS — K219 Gastro-esophageal reflux disease without esophagitis: Secondary | ICD-10-CM | POA: Diagnosis present

## 2021-02-07 DIAGNOSIS — J9601 Acute respiratory failure with hypoxia: Secondary | ICD-10-CM | POA: Diagnosis present

## 2021-02-07 DIAGNOSIS — D649 Anemia, unspecified: Secondary | ICD-10-CM | POA: Diagnosis present

## 2021-02-07 DIAGNOSIS — I517 Cardiomegaly: Secondary | ICD-10-CM | POA: Diagnosis not present

## 2021-02-07 DIAGNOSIS — E875 Hyperkalemia: Secondary | ICD-10-CM | POA: Diagnosis present

## 2021-02-07 DIAGNOSIS — J90 Pleural effusion, not elsewhere classified: Secondary | ICD-10-CM | POA: Diagnosis not present

## 2021-02-07 DIAGNOSIS — I4819 Other persistent atrial fibrillation: Secondary | ICD-10-CM | POA: Diagnosis present

## 2021-02-07 LAB — ECHOCARDIOGRAM COMPLETE
Area-P 1/2: 7.44 cm2
Calc EF: 37.3 %
Height: 63 in
MV M vel: 5.28 m/s
MV Peak grad: 111.5 mmHg
MV VTI: 1.66 cm2
Radius: 1 cm
S' Lateral: 4.8 cm
Single Plane A2C EF: 39.7 %
Single Plane A4C EF: 34.7 %
Weight: 3336.88 oz

## 2021-02-07 LAB — CBC
HCT: 27.2 % — ABNORMAL LOW (ref 36.0–46.0)
Hemoglobin: 8 g/dL — ABNORMAL LOW (ref 12.0–15.0)
MCH: 28 pg (ref 26.0–34.0)
MCHC: 29.4 g/dL — ABNORMAL LOW (ref 30.0–36.0)
MCV: 95.1 fL (ref 80.0–100.0)
Platelets: 276 10*3/uL (ref 150–400)
RBC: 2.86 MIL/uL — ABNORMAL LOW (ref 3.87–5.11)
RDW: 15.1 % (ref 11.5–15.5)
WBC: 6.6 10*3/uL (ref 4.0–10.5)
nRBC: 0 % (ref 0.0–0.2)

## 2021-02-07 LAB — RESP PANEL BY RT-PCR (FLU A&B, COVID) ARPGX2
Influenza A by PCR: NEGATIVE
Influenza B by PCR: NEGATIVE
SARS Coronavirus 2 by RT PCR: NEGATIVE

## 2021-02-07 LAB — BASIC METABOLIC PANEL
Anion gap: 10 (ref 5–15)
BUN: 32 mg/dL — ABNORMAL HIGH (ref 8–23)
CO2: 23 mmol/L (ref 22–32)
Calcium: 10.2 mg/dL (ref 8.9–10.3)
Chloride: 104 mmol/L (ref 98–111)
Creatinine, Ser: 1.47 mg/dL — ABNORMAL HIGH (ref 0.44–1.00)
GFR, Estimated: 36 mL/min — ABNORMAL LOW (ref >60)
Glucose, Bld: 143 mg/dL — ABNORMAL HIGH (ref 70–99)
Potassium: 3.7 mmol/L (ref 3.5–5.1)
Sodium: 137 mmol/L (ref 135–145)

## 2021-02-07 LAB — GLUCOSE, CAPILLARY
Glucose-Capillary: 146 mg/dL — ABNORMAL HIGH (ref 70–99)
Glucose-Capillary: 114 mg/dL — ABNORMAL HIGH (ref 70–99)
Glucose-Capillary: 136 mg/dL — ABNORMAL HIGH (ref 70–99)
Glucose-Capillary: 114 mg/dL — ABNORMAL HIGH (ref 70–99)
Glucose-Capillary: 123 mg/dL — ABNORMAL HIGH (ref 70–99)

## 2021-02-07 LAB — LIPID PANEL
Cholesterol: 108 mg/dL (ref 0–200)
HDL: 29 mg/dL — ABNORMAL LOW (ref >40)
LDL Cholesterol: 66 mg/dL (ref 0–99)
Total CHOL/HDL Ratio: 3.7 RATIO
Triglycerides: 65 mg/dL (ref <150)
VLDL: 13 mg/dL (ref 0–40)

## 2021-02-07 LAB — TSH: TSH: 3.605 u[IU]/mL (ref 0.350–4.500)

## 2021-02-07 MED ORDER — ONDANSETRON HCL 4 MG/2ML IJ SOLN: 4.0000 mg | Freq: Four times a day (QID) | INTRAMUSCULAR | Status: DC | PRN

## 2021-02-07 MED ORDER — ACETAMINOPHEN 325 MG PO TABS
650.0000 mg | ORAL_TABLET | Freq: Four times a day (QID) | ORAL | Status: DC | PRN
  Administered 2021-02-08 – 2021-02-09 (×2): 650 mg via ORAL
  Filled 2021-02-07 (×2): qty 2

## 2021-02-07 MED ORDER — FUROSEMIDE 10 MG/ML IJ SOLN
40.0000 mg | Freq: Two times a day (BID) | INTRAMUSCULAR | Status: DC
  Administered 2021-02-07 – 2021-02-09 (×4): 40 mg via INTRAVENOUS
  Filled 2021-02-07 (×4): qty 4

## 2021-02-07 MED ORDER — VITAMIN D3 25 MCG (1000 UNIT) PO TABS
5000.0000 [IU] | ORAL_TABLET | Freq: Every day | ORAL | Status: DC
  Administered 2021-02-07 – 2021-02-10 (×4): 5000 [IU] via ORAL
  Filled 2021-02-07 (×4): qty 5

## 2021-02-07 MED ORDER — METOPROLOL TARTRATE 25 MG PO TABS
25.0000 mg | ORAL_TABLET | Freq: Two times a day (BID) | ORAL | Status: DC
  Administered 2021-02-07 – 2021-02-08 (×4): 25 mg via ORAL
  Filled 2021-02-07 (×4): qty 1

## 2021-02-07 MED ORDER — METOPROLOL TARTRATE 5 MG/5ML IV SOLN
2.5000 mg | INTRAVENOUS | Status: DC
  Filled 2021-02-07: qty 5

## 2021-02-07 MED ORDER — ACETAMINOPHEN 325 MG PO TABS
650.0000 mg | ORAL_TABLET | ORAL | Status: DC | PRN
  Administered 2021-02-07: 650 mg via ORAL
  Filled 2021-02-07: qty 2

## 2021-02-07 MED ORDER — ALLOPURINOL 100 MG PO TABS
100.0000 mg | ORAL_TABLET | Freq: Every day | ORAL | Status: DC
  Administered 2021-02-07 – 2021-02-10 (×4): 100 mg via ORAL
  Filled 2021-02-07 (×4): qty 1

## 2021-02-07 MED ORDER — FUROSEMIDE 10 MG/ML IJ SOLN
40.0000 mg | Freq: Two times a day (BID) | INTRAMUSCULAR | Status: DC
  Administered 2021-02-07: 40 mg via INTRAVENOUS
  Filled 2021-02-07: qty 4

## 2021-02-07 MED ORDER — PANTOPRAZOLE SODIUM 40 MG PO TBEC
40.0000 mg | DELAYED_RELEASE_TABLET | Freq: Two times a day (BID) | ORAL | Status: DC
  Administered 2021-02-07 – 2021-02-10 (×7): 40 mg via ORAL
  Filled 2021-02-07 (×7): qty 1

## 2021-02-07 MED ORDER — ATORVASTATIN CALCIUM 20 MG PO TABS
20.0000 mg | ORAL_TABLET | Freq: Every day | ORAL | Status: DC
  Administered 2021-02-07 – 2021-02-10 (×4): 20 mg via ORAL
  Filled 2021-02-07 (×4): qty 1

## 2021-02-07 MED ORDER — OXYBUTYNIN CHLORIDE ER 5 MG PO TB24
10.0000 mg | ORAL_TABLET | Freq: Every day | ORAL | Status: DC
  Administered 2021-02-07 – 2021-02-09 (×3): 10 mg via ORAL
  Filled 2021-02-07 (×3): qty 2

## 2021-02-07 MED ORDER — INSULIN ASPART 100 UNIT/ML IJ SOLN: 0.0000 [IU] | Freq: Every day | INTRAMUSCULAR | Status: DC

## 2021-02-07 MED ORDER — APIXABAN 5 MG PO TABS
5.0000 mg | ORAL_TABLET | Freq: Two times a day (BID) | ORAL | Status: DC
  Administered 2021-02-07 – 2021-02-10 (×7): 5 mg via ORAL
  Filled 2021-02-07 (×7): qty 1

## 2021-02-07 MED ORDER — INSULIN ASPART 100 UNIT/ML IJ SOLN
0.0000 [IU] | Freq: Three times a day (TID) | INTRAMUSCULAR | Status: DC
  Administered 2021-02-07 – 2021-02-09 (×3): 2 [IU] via SUBCUTANEOUS

## 2021-02-07 MED ORDER — LEVOTHYROXINE SODIUM 50 MCG PO TABS
50.0000 ug | ORAL_TABLET | Freq: Every day | ORAL | Status: DC
  Administered 2021-02-07 – 2021-02-10 (×4): 50 ug via ORAL
  Filled 2021-02-07 (×4): qty 1

## 2021-02-07 NOTE — Progress Notes (Signed)
[name] [name]  LNL:[id number] DOB: 04-28-1941 DOA: 02/06/2021 PCP: Loman Brooklyn, FNP    Brief Narrative:  [phone] with a history of chronic atrial fibrillation on Eliquis, DM2, HTN, hypothyroidism, morbid obesity, DVT and pulmonary embolism, osteoarthritis, and GERD who presented to the ER with palpitations and shortness of breath.  This was associated with gradually worsening pedal edema and orthopnea.  In the ER she was found to have heart rates ranging between 120-160.  Exam and CXR were also consistent with pulmonary edema.  Significant Events:  7/17 admit via ER  Consultants:  None  Code Status: FULL CODE  Antimicrobials:  None  DVT prophylaxis: Eliquis  Subjective: Patient was interviewed and examined by one of my partners earlier today. Appears volume overloaded on f/u exam.   Assessment & Plan:  Chronic atrial fibrillation with acute RVR Patient was cardioverted in the ED first with 120 J biphasic then 200 J ultimately resulting in conversion to normal sinus rhythm - remains in sinus rhythm for now -TSH within normal range  Acute newly diagnosed CHF -pulmonary edema Likely rate related due to RVR - EF normal on TTE Sept 2020 - f/u TTE - continue diuresis - watch renal function - strict Is/Os  Filed Weights   02/06/21 2155 02/07/21 0115  Weight: 93 kg 94.6 kg     Acute kidney injury Creatinine 1.43 at presentation with baseline creatinine 0.97 -monitor closely with aggressive diuresis  Recent Labs  Lab 02/06/21 2132 02/07/21 0528  CREATININE 1.43* 1.47*     Chronic Normocytic anemia Hemoglobin 8.5 at presentation - checking guaiac on this patient on chronic anticoagulation - appears hemoglobin has slowly been trending down over the last month - pt is on oral Fe tx already   Nephrolothiasis w/ hx of Urosepsis s/p cystoscopy/ureteroscopy w/ laser litho and basket extraction stones & R ureteral stent exchange 01/15/21 (Dr. Abner Greenspan)  HTN Blood pressure  currently well controlled  DM2 CBG well controlled at present  Hypothyroidism Appears reasonably well managed with Synthroid  History of DVT/PE Continue Eliquis for now   GERD  Morbid obesity   Family Communication:  Status is: Inpatient  Remains inpatient appropriate because:Inpatient level of care appropriate due to severity of illness  Dispo: The patient is from: Home              Anticipated d/c is to:  Unclear              Patient currently is not medically stable to d/c.   Difficult to place patient No   Objective: Blood pressure 132/86, pulse 91, temperature (!) 97.5 F (36.4 C), temperature source Oral, resp. rate 16, height 5\' 3"  (1.6 m), weight 94.6 kg, SpO2 95 %.  Intake/Output Summary (Last 24 hours) at 02/07/2021 0955 Last data filed at 02/07/2021 0043 Gross per 24 hour  Intake 600 ml  Output --  Net 600 ml   Filed Weights   02/06/21 2155 02/07/21 0115  Weight: 93 kg 94.6 kg    Examination: Patient examined by one of my partners earlier today.  I have seen her for follow-up visit.  CBC: Recent Labs  Lab 02/06/21 2132 02/07/21 0528  WBC 7.0 6.6  HGB 8.5* 8.0*  HCT 27.9* 27.2*  MCV 93.3 95.1  PLT 299 448   Basic Metabolic Panel: Recent Labs  Lab 02/06/21 2132 02/07/21 0528  NA 135 137  K 3.8 3.7  CL 104 104  CO2 22 23  GLUCOSE 149* 143*  BUN  32* 32*  CREATININE 1.43* 1.47*  CALCIUM 10.7* 10.2   GFR: Estimated Creatinine Clearance: 33.9 mL/min (A) (by C-G formula based on SCr of 1.47 mg/dL (H)).  Liver Function Tests: No results for input(s): AST, ALT, ALKPHOS, BILITOT, PROT, ALBUMIN in the last 168 hours. No results for input(s): LIPASE, AMYLASE in the last 168 hours. No results for input(s): AMMONIA in the last 168 hours.  Coagulation Profile: Recent Labs  Lab 02/06/21 2132  INR 1.5*     HbA1C: HB A1C (BAYER DCA - WAIVED)  Date/Time Value Ref Range Status  10/20/2020 09:50 AM 5.7 <7.0 % Final    Comment:                                           Diabetic Adult            <7.0                                       Healthy Adult        4.3 - 5.7                                                           (DCCT/NGSP) American Diabetes Association's Summary of Glycemic Recommendations for Adults with Diabetes: Hemoglobin A1c <7.0%. More stringent glycemic goals (A1c <6.0%) may further reduce complications at the cost of increased risk of hypoglycemia.   06/04/2020 08:08 AM 5.4 <7.0 % Final    Comment:                                          Diabetic Adult            <7.0                                       Healthy Adult        4.3 - 5.7                                                           (DCCT/NGSP) American Diabetes Association's Summary of Glycemic Recommendations for Adults with Diabetes: Hemoglobin A1c <7.0%. More stringent glycemic goals (A1c <6.0%) may further reduce complications at the cost of increased risk of hypoglycemia.    Hgb A1c MFr Bld  Date/Time Value Ref Range Status  12/25/2020 02:53 AM 5.7 (H) 4.8 - 5.6 % Final    Comment:    (NOTE)         Prediabetes: 5.7 - 6.4         Diabetes: >6.4         Glycemic control for adults with diabetes: <7.0     CBG: Recent Labs  Lab 02/07/21 0113 02/07/21 0758  GLUCAP  146* 114*    Recent Results (from the past 240 hour(s))  Resp Panel by RT-PCR (Flu A&B, Covid) Nasopharyngeal Swab     Status: None   Collection Time: 02/07/21  3:04 AM   Specimen: Nasopharyngeal Swab; Nasopharyngeal(NP) swabs in vial transport medium  Result Value Ref Range Status   SARS Coronavirus 2 by RT PCR NEGATIVE NEGATIVE Final    Comment: (NOTE) SARS-CoV-2 target nucleic acids are NOT DETECTED.  The SARS-CoV-2 RNA is generally detectable in upper respiratory specimens during the acute phase of infection. The lowest concentration of SARS-CoV-2 viral copies this assay can detect is 138 copies/mL. A negative result does not preclude SARS-Cov-2 infection  and should not be used as the sole basis for treatment or other patient management decisions. A negative result may occur with  improper specimen collection/handling, submission of specimen other than nasopharyngeal swab, presence of viral mutation(s) within the areas targeted by this assay, and inadequate number of viral copies(<138 copies/mL). A negative result must be combined with clinical observations, patient history, and epidemiological information. The expected result is Negative.  Fact Sheet for Patients:  EntrepreneurPulse.com.au  Fact Sheet for Healthcare Providers:  IncredibleEmployment.be  This test is no t yet approved or cleared by the [place] FDA and  has been authorized for detection and/or diagnosis of SARS-CoV-2 by FDA under an Emergency Use Authorization (EUA). This EUA will remain  in effect (meaning this test can be used) for the duration of the COVID-19 declaration under Section 564(b)(1) of the Act, 21 U.S.C.section 360bbb-3(b)(1), unless the authorization is terminated  or revoked sooner.       Influenza A by PCR NEGATIVE NEGATIVE Final   Influenza B by PCR NEGATIVE NEGATIVE Final    Comment: (NOTE) The Xpert Xpress SARS-CoV-2/FLU/RSV plus assay is intended as an aid in the diagnosis of influenza from Nasopharyngeal swab specimens and should not be used as a sole basis for treatment. Nasal washings and aspirates are unacceptable for Xpert Xpress SARS-CoV-2/FLU/RSV testing.  Fact Sheet for Patients: EntrepreneurPulse.com.au  Fact Sheet for Healthcare Providers: IncredibleEmployment.be  This test is not yet approved or cleared by the [place] FDA and has been authorized for detection and/or diagnosis of SARS-CoV-2 by FDA under an Emergency Use Authorization (EUA). This EUA will remain in effect (meaning this test can be used) for the duration of the COVID-19 declaration  under Section 564(b)(1) of the Act, 21 U.S.C. section 360bbb-3(b)(1), unless the authorization is terminated or revoked.  Performed at [facility], [place] [address] [place] , [place] [address]      Scheduled Meds:  apixaban  5 mg Oral BID   furosemide  40 mg Intravenous BID   insulin aspart  0-15 Units Subcutaneous TID WC   insulin aspart  0-5 Units Subcutaneous QHS   levothyroxine  50 mcg Oral Q0600   metoprolol tartrate  25 mg Oral BID   pantoprazole  40 mg Oral BID AC      LOS: 0 days   Cherene Altes, MD [facility] Office  [phone] Pager - Text Page per Shea Evans  If 7PM-7AM, please contact night-coverage per Amion 02/07/2021, 9:55 AM

## 2021-02-07 NOTE — ED Notes (Signed)
ED TO INPATIENT HANDOFF REPORT  Name/Age/Gender [name] [name] 80 y.o. female  Code Status Code Status History    Date Active Date Inactive Code Status Order ID Comments User Context   12/25/2020 0011 12/31/2020 0159 Full Code [id number]  Spero Geralds, MD ED   12/06/2019 1933 12/09/2019 1946 Full Code [id number]  [place], Silver Huguenin, MD Inpatient   04/03/2019 0946 04/04/2019 1818 Full Code [id number]  [place], [place], DO Inpatient   10/29/2013 1104 10/30/2013 1850 Full Code [id number]  Krystal Eaton, PA-C Inpatient    Questions for Most Recent Historical Code Status (Order [id number])       Home/SNF/Other Home  Chief Complaint Rapid atrial fibrillation ([place]) [I48.91]  Level of Care/Admitting Diagnosis ED Disposition    ED Disposition  Admit   Condition  --   Comment  Hospital Area: [place] [[id number]]  Level of Care: Telemetry [5]  Admit to tele based on following criteria: Complex arrhythmia (Bradycardia/Tachycardia)  May admit patient to [facility] or [facility] if equivalent level of care is available:: No  Covid Evaluation: Asymptomatic Screening Protocol (No Symptoms)  Diagnosis: Rapid atrial fibrillation ([place]) [384536]  Admitting Physician: Elwyn Reach [[id number]]  Attending Physician: Elwyn Reach [[id number]]  Estimated length of stay: past midnight tomorrow  Certification:: I certify this patient will need inpatient services for at least 2 midnights         Medical History Past Medical History:  Diagnosis Date  . Anemia   . Arthritis    Ostearthritis- hips, knees, fingers  . Diabetes mellitus without complication ([place])   . DVT (deep venous thrombosis) ([place])   . Dyspnea   . GERD (gastroesophageal reflux disease)   . Headache(784.0)    tx. Valproic acid  . History of kidney stones   . Hypertension   . Hypothyroidism   . Pulmonary embolism ([facility])     Allergies No Known Allergies  IV Location/Drains/Wounds Patient  Lines/Drains/Airways Status    Active Line/Drains/Airways    Name Placement date Placement time Site Days   Peripheral IV 02/06/21 20 G Right Antecubital 02/06/21  2134  Antecubital  1   Ureteral Drain/Stent Right ureter 6 Fr. 01/15/21  1551  Right ureter  23   Wound / Incision (Open or Dehisced) 12/25/20 Skin tear Hip Left quarter-sized skin tear 12/25/20  1800  Hip  44          Labs/Imaging Results for orders placed or performed during the hospital encounter of 02/06/21 (from the past 48 hour(s))  Basic metabolic panel     Status: Abnormal   Collection Time: 02/06/21  9:32 PM  Result Value Ref Range   Sodium 135 135 - 145 mmol/L   Potassium 3.8 3.5 - 5.1 mmol/L   Chloride 104 98 - 111 mmol/L   CO2 22 22 - 32 mmol/L   Glucose, Bld 149 (H) 70 - 99 mg/dL    Comment: Glucose reference range applies only to samples taken after fasting for at least 8 hours.   BUN 32 (H) 8 - 23 mg/dL   Creatinine, Ser 1.43 (H) 0.44 - 1.00 mg/dL   Calcium 10.7 (H) 8.9 - 10.3 mg/dL   GFR, Estimated 37 (L) >60 mL/min    Comment: (NOTE) Calculated using the CKD-EPI Creatinine Equation (2021)    Anion gap 9 5 - 15    Comment: Performed at [facility], [place] [address], [place], [place] [address]  CBC  Status: Abnormal   Collection Time: 02/06/21  9:32 PM  Result Value Ref Range   WBC 7.0 4.0 - 10.5 K/uL   RBC 2.99 (L) 3.87 - 5.11 MIL/uL   Hemoglobin 8.5 (L) 12.0 - 15.0 g/dL   HCT 27.9 (L) 36.0 - 46.0 %   MCV 93.3 80.0 - 100.0 fL   MCH 28.4 26.0 - 34.0 pg   MCHC 30.5 30.0 - 36.0 g/dL   RDW 15.0 11.5 - 15.5 %   Platelets 299 150 - 400 K/uL   nRBC 0.0 0.0 - 0.2 %    Comment: Performed at [facility], [place] [address], [place], [place] [address]  Protime-INR- (order if Patient is taking Coumadin / Warfarin)     Status: Abnormal   Collection Time: 02/06/21  9:32 PM  Result Value Ref Range   Prothrombin Time 18.0 (H) 11.4 - 15.2 seconds   INR 1.5 (H) 0.8 - 1.2     Comment: (NOTE) INR goal varies based on device and disease states. Performed at [facility], [place] [address] [place], [place] [address]    DG Chest 2 View  Result Date: 02/06/2021 CLINICAL DATA:  Shortness of breath and tachycardia. EXAM: CHEST - 2 VIEW COMPARISON:  December 30, 2020 FINDINGS: Enlarged cardiac silhouette. Small bilateral pleural effusions. Interstitial pulmonary edema. Calcific atherosclerotic disease and tortuosity of the aorta. Osseous structures are without acute abnormality. Soft tissues are grossly normal. IMPRESSION: Enlarged cardiac silhouette with interstitial pulmonary edema and small bilateral pleural effusions. Electronically Signed   By: Fidela Salisbury M.D.   On: 02/06/2021 22:00    Pending Labs Unresulted Labs (From admission, onward)    Start     Ordered   02/06/21 2257  Resp Panel by RT-PCR (Flu A&B, Covid) Nasopharyngeal Swab  (Tier 2 - Symptomatic/asymptomatic with Precautions )  Once,   STAT       Question Answer Comment  Is this test for diagnosis or screening Screening   Symptomatic for COVID-19 as defined by CDC No   Hospitalized for COVID-19 No   Admitted to ICU for COVID-19 No   Previously tested for COVID-19 Yes   Resident in a congregate (group) care setting No   Employed in healthcare setting No   Pregnant No   Has patient completed COVID vaccination(s) (2 doses of Pfizer/Moderna 1 dose of [employer]) Yes   Has patient completed COVID Booster / 3rd dose Yes      02/06/21 2257   Unscheduled  Occult blood card to lab, stool  As needed,   R      02/07/21 0025   Signed and Held  TSH  Once,   R        Signed and Held   Signed and [employer] metabolic panel  Tomorrow morning,   R        Signed and Held   Signed and Held  Lipid panel  Tomorrow morning,   R        Signed and Held   Signed and Held  CBC  Tomorrow morning,   R        Signed and Held          Vitals/Pain Today's Vitals   02/06/21 2335  02/06/21 2340 02/06/21 2345 02/07/21 0000  BP: (!) 151/123 (!) 157/105 (!) 162/110 (!) 156/95  Pulse: (!) 101 97 100 95  Resp: (!) 22 16 20 16   Temp:    98 F (36.7  C)  TempSrc: Oral   Oral  SpO2: 99% 100% 99% 95%  Weight:      Height:      PainSc:        Isolation Precautions Airborne and Contact precautions  Medications Medications  sodium chloride 0.9 % bolus 500 mL (0 mLs Intravenous Stopped 02/06/21 2243)  metoprolol tartrate (LOPRESSOR) injection 5 mg (5 mg Intravenous Given 02/06/21 2152)  etomidate (AMIDATE) injection 13.96 mg (13.96 mg Intravenous Given 02/06/21 2327)  furosemide (LASIX) injection 40 mg (40 mg Intravenous Given 02/06/21 2337)  0.9 %  sodium chloride infusion ( Intravenous Stopped 02/07/21 0043)  etomidate (AMIDATE) injection (13.96 mg Intravenous Not Given 02/06/21 2349)    Mobility walks with device

## 2021-02-07 NOTE — Progress Notes (Signed)
  Echocardiogram 2D Echocardiogram has been performed.  [name] [name] M 02/07/2021, 1:34 PM

## 2021-02-07 NOTE — H&P (Addendum)
History and Physical   [name] [name] FBP:[id number] DOB: 24-Oct-1940 DOA: 02/06/2021  Referring MD/NP/PA: Dr. Eulis Foster  PCP: Loman Brooklyn, FNP   Outpatient Specialists: Dr. Percival Spanish, cardiology  Patient coming from: Home  Chief Complaint: Shortness of breath and palpitation  HPI: [name] [name] is a 80 y.o. female with medical history significant of chronic atrial fibrillation, diabetes, morbid obesity, essential hypertension, hypothyroidism, history of pulmonary embolism and DVT, history of osteoarthritis, GERD who presents from home with shortness of breath and tachycardia.  Patient is on Eliquis as well as Cardizem CD for her A. fib.  She has been taking her medications but noticed gradual bilateral pedal edema with PND and orthopnea.  Patient had recent kidney stones which was removed by the urologist.  Since then she is noted worsening symptoms slowly.  Denied any chest pain.  Denied any sick contacts.  Denied any significant fever or chills but has had some cough which is nonproductive.  Patient was seen with heart rates between 120s to 160s which has been like that at home.  She appears to be in A. fib with RVR.  She also appears to have increased fluid overload consistent with pulmonary edema and CHF.  She has had an echo back in 2020 that showed normal EF.  Patient being admitted with new onset CHF as well as A. fib with RVR.  Patient was cardioverted in the ER and appears to be in sinus rhythm now.  She does have low hemoglobin which could be partially contributing..  ED Course: Temperature is 98.9, blood pressure 162/110, pulse 165, respiratory of 30 and oxygen sats 93% on room air.  Sodium 135 potassium 3.8 chloride 104, CO2 22 glucose 149, BUN 32 creatinine 1.43 and calcium 10.7.  Gap of 9.  White count is 7.0 hemoglobin 8.5 and platelet count of 299.  INR 1.5.  Chest x-ray showed enlarged cardiac silhouette with interstitial pulmonary edema and small bilateral pleural  effusions.  EKG showed A. fib with RVR.  Patient being admitted for further evaluation and treatment.  Review of Systems: As per HPI otherwise 10 point review of systems negative.    Past Medical History:  Diagnosis Date   Anemia    Arthritis    Ostearthritis- hips, knees, fingers   Diabetes mellitus without complication (HCC)    DVT (deep venous thrombosis) (HCC)    Dyspnea    GERD (gastroesophageal reflux disease)    Headache(784.0)    tx. Valproic acid   History of kidney stones    Hypertension    Hypothyroidism    Pulmonary embolism [facility])     Past Surgical History:  Procedure Laterality Date   ABDOMINAL HYSTERECTOMY     BILATERAL HIP ARTHROSCOPY Left    BIOPSY  12/08/2019   Procedure: BIOPSY;  Surgeon: Daneil Dolin, MD;  Location: [facility];  Service: Endoscopy;;   CATARACT EXTRACTION, BILATERAL     COLONOSCOPY N/A 12/08/2019   polyps (tubular adenoma), diverticulosis, colonic lipoma, no surveillance due to age   56 W/ [place] Right 12/25/2020   Procedure: CYSTOSCOPY WITH RETROGRADE PYELOGRAM/URETERAL STENT PLACEMENT;  Surgeon: Janith Lima, MD;  Location: [facility];  Service: Urology;  Laterality: Right;   CYSTOSCOPY/URETEROSCOPY/HOLMIUM LASER/STENT PLACEMENT Right 01/15/2021   Procedure: CYSTOSCOPY/RETROGRADE/URETEROSCOPY/HOLMIUM LASER/STENT EXCHANGE;  Surgeon: Janith Lima, MD;  Location: [facility];  Service: Urology;  Laterality: Right;   ESOPHAGOGASTRODUODENOSCOPY N/A 12/08/2019   normal esophagus with 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  GIVENS CAPSULE STUDY N/A 01/13/2020   Procedure: GIVENS CAPSULE STUDY;  Surgeon: Daneil Dolin, MD;  Location: [facility];  Service: Endoscopy;  Laterality: N/A;  7:30am   MULTIPLE TOOTH EXTRACTIONS     60's   PARATHYROIDECTOMY     POLYPECTOMY  12/08/2019   Procedure: POLYPECTOMY;  Surgeon: Daneil Dolin, MD;  Location: [facility];  Service: Endoscopy;;    TOTAL HIP ARTHROPLASTY Right 10/29/2013   Procedure: RIGHT TOTAL HIP ARTHROPLASTY ANTERIOR APPROACH;  Surgeon: Mauri Pole, MD;  Location: [facility];  Service: Orthopedics;  Laterality: Right;     reports that she has never smoked. She has never used smokeless tobacco. She reports that she does not drink alcohol and does not use drugs.  No Known Allergies  Family History  Problem Relation Age of Onset   Colitis Sister 1       alive   Cancer Sister 67       colon   Cancer Mother 48       uterine   Heart disease Father 86       heart failure   Heart attack Brother 28   Healthy Daughter    Healthy Son    Pulmonary embolism Brother 33   Heart disease Brother    Healthy Son    Healthy Son      Prior to Admission medications   Medication Sig Start Date End Date Taking? Authorizing Provider  acetaminophen (TYLENOL) 325 MG tablet Take 2 tablets (650 mg total) by mouth every 6 (six) hours as needed for mild pain or headache (or Fever >/= 101). 04/04/19   Roxan Hockey, MD  allopurinol (ZYLOPRIM) 100 MG tablet Take 1 tablet (100 mg total) by mouth daily. 01/21/21   Loman Brooklyn, FNP  apixaban (ELIQUIS) 5 MG TABS tablet Take 1 tablet (5 mg total) by mouth 2 (two) times daily. 01/08/21   Minus Breeding, MD  atorvastatin (LIPITOR) 20 MG tablet Take 1 tablet (20 mg total) by mouth daily. 12/31/20   Bonnielee Haff, MD  Cholecalciferol (VITAMIN D-3) 5000 UNITS TABS Take 5,000 Units by mouth daily.    [provider]  diclofenac Sodium (VOLTAREN) 1 % GEL APPLY 4 GRAMS TOPICALLY 4  TIMES DAILY 01/26/21   Loman Brooklyn, FNP  diltiazem (CARTIA XT) 240 MG 24 hr capsule Take 1 capsule (240 mg total) by mouth daily. 12/30/20 03/30/21  Bonnielee Haff, MD  docusate sodium (COLACE) 100 MG capsule Take 1 capsule (100 mg total) by mouth daily as needed for up to 30 doses. 01/15/21   Janith Lima, MD  EUTHYROX 50 MCG tablet TAKE 1 TABLET BY MOUTH ONCE DAILY BEFORE BREAKFAST Patient taking  differently: Take 50 mcg by mouth daily before breakfast. 09/14/20   Claretta Fraise, MD  ferrous sulfate 325 (65 FE) MG tablet Take 325 mg by mouth daily with breakfast.    [provider]  furosemide (LASIX) 20 MG tablet TAKE 1 TABLET BY MOUTH  TWICE DAILY Patient taking differently: Take 20 mg by mouth 2 (two) times daily. 10/22/20   Loman Brooklyn, FNP  hydrocortisone cream 0.5 % Apply topically 2 (two) times daily. Patient taking differently: Apply 1 application topically daily. 12/30/20   Bonnielee Haff, MD  loratadine (CLARITIN) 10 MG tablet Take 10 mg by mouth daily.    [provider]  ONE TOUCH ULTRA TEST test strip USE UP TO FOUR TIMES DAILY AS DIRECTED 10/04/17   Terald Sleeper, PA-C  oxybutynin (DITROPAN-XL) 10 MG 24 hr tablet Take 1 tablet (10 mg total) by mouth at bedtime. 11/23/20   Loman Brooklyn, FNP  pantoprazole (PROTONIX) 40 MG tablet Take 1 tablet (40 mg total) by mouth 2 (two) times daily before a meal. 11/16/20   Loman Brooklyn, FNP    Physical Exam: Vitals:   02/06/21 2335 02/06/21 2340 02/06/21 2345 02/07/21 0000  BP: (!) 151/123 (!) 157/105 (!) 162/110 (!) 156/95  Pulse: (!) 101 97 100 95  Resp: (!) 22 16 20 16   Temp:    98 F (36.7 C)  TempSrc: Oral   Oral  SpO2: 99% 100% 99% 95%  Weight:      Height:          Constitutional: Morbidly obese, chronically ill looking, no distress Vitals:   02/06/21 2335 02/06/21 2340 02/06/21 2345 02/07/21 0000  BP: (!) 151/123 (!) 157/105 (!) 162/110 (!) 156/95  Pulse: (!) 101 97 100 95  Resp: (!) 22 16 20 16   Temp:    98 F (36.7 C)  TempSrc: Oral   Oral  SpO2: 99% 100% 99% 95%  Weight:      Height:       Eyes: PERRL, lids and conjunctivae normal ENMT: Mucous membranes are moist. Posterior pharynx clear of any exudate or lesions.Normal dentition.  Neck: normal, supple, no masses, no thyromegaly Respiratory: Decreased air entry at the bases with crackles, no wheeze no rales. Normal respiratory  effort. No accessory muscle use.  Cardiovascular: Atrial fibrillation with rapid ventricular response with 2+ pedal edema. 2+ pedal pulses. No carotid bruits.  Abdomen: no tenderness, no masses palpated. No hepatosplenomegaly. Bowel sounds positive.  Musculoskeletal: no clubbing / cyanosis. No joint deformity upper and lower extremities. Good ROM, no contractures. Normal muscle tone.  Skin: no rashes, lesions, ulcers. No induration Neurologic: CN 2-12 grossly intact. Sensation intact, DTR normal. Strength 5/5 in all 4.  Psychiatric: Normal judgment and insight. Alert and oriented x 3.  Anxious mood.     Labs on Admission: I have personally reviewed following labs and imaging studies  CBC: Recent Labs  Lab 02/06/21 2132  WBC 7.0  HGB 8.5*  HCT 27.9*  MCV 93.3  PLT 673   Basic Metabolic Panel: Recent Labs  Lab 02/06/21 2132  NA 135  K 3.8  CL 104  CO2 22  GLUCOSE 149*  BUN 32*  CREATININE 1.43*  CALCIUM 10.7*   GFR: Estimated Creatinine Clearance: 34.5 mL/min (A) (by C-G formula based on SCr of 1.43 mg/dL (H)). Liver Function Tests: No results for input(s): AST, ALT, ALKPHOS, BILITOT, PROT, ALBUMIN in the last 168 hours. No results for input(s): LIPASE, AMYLASE in the last 168 hours. No results for input(s): AMMONIA in the last 168 hours. Coagulation Profile: Recent Labs  Lab 02/06/21 2132  INR 1.5*   Cardiac Enzymes: No results for input(s): CKTOTAL, CKMB, CKMBINDEX, TROPONINI in the last 168 hours. BNP (last 3 results) No results for input(s): PROBNP in the last 8760 hours. HbA1C: No results for input(s): HGBA1C in the last 72 hours. CBG: No results for input(s): GLUCAP in the last 168 hours. Lipid Profile: No results for input(s): CHOL, HDL, LDLCALC, TRIG, CHOLHDL, LDLDIRECT in the last 72 hours. Thyroid Function Tests: No results for input(s): TSH, T4TOTAL, FREET4, T3FREE, THYROIDAB in the last 72 hours. Anemia Panel: No results for input(s): VITAMINB12,  FOLATE, FERRITIN, TIBC, IRON, RETICCTPCT in the last 72 hours. Urine analysis:    Component Value Date/Time  COLORURINE YELLOW 12/24/2020 2136   APPEARANCEUR CLOUDY (A) 12/24/2020 2136   LABSPEC 1.013 12/24/2020 2136   PHURINE 5.0 12/24/2020 2136   GLUCOSEU NEGATIVE 12/24/2020 2136   HGBUR LARGE (A) 12/24/2020 2136   BILIRUBINUR NEGATIVE 12/24/2020 2136   KETONESUR NEGATIVE 12/24/2020 2136   PROTEINUR 100 (A) 12/24/2020 2136   UROBILINOGEN 0.2 10/22/2013 1127   NITRITE NEGATIVE 12/24/2020 2136   LEUKOCYTESUR LARGE (A) 12/24/2020 2136   Sepsis Labs: @LABRCNTIP (procalcitonin:4,lacticidven:4) )No results found for this or any previous visit (from the past 240 hour(s)).   Radiological Exams on Admission: DG Chest 2 View  Result Date: 02/06/2021 CLINICAL DATA:  Shortness of breath and tachycardia. EXAM: CHEST - 2 VIEW COMPARISON:  December 30, 2020 FINDINGS: Enlarged cardiac silhouette. Small bilateral pleural effusions. Interstitial pulmonary edema. Calcific atherosclerotic disease and tortuosity of the aorta. Osseous structures are without acute abnormality. Soft tissues are grossly normal. IMPRESSION: Enlarged cardiac silhouette with interstitial pulmonary edema and small bilateral pleural effusions. Electronically Signed   By: Fidela Salisbury M.D.   On: 02/06/2021 22:00    EKG: Independently reviewed.  Shows A. fib with RVR with a rate in the 120s to 160s  Assessment/Plan Principal Problem:   Rapid atrial fibrillation (HCC) Active Problems:   Anemia   Morbid obesity ([place])   Type 2 diabetes mellitus without complication, without long-term current use of insulin ([place])   Essential hypertension   Hypothyroidism   Hyperlipidemia associated with type 2 diabetes mellitus (HCC)   Gastroesophageal reflux disease without esophagitis   Unprovoked DVT and Unprovoked Pulmonary Embolism -Dxed 03/2019   Acute CHF (congestive heart failure) (HCC)   Hypercalcemia     #1 atrial fibrillation  with rapid ventricular response: Patient appears to have fluid overload and new onset CHF.  She was cardioverted in the ER and rate is down into the 90s at the moment.  She has been on her Eliquis which we will continue with.  Also the Cardizem CD which we will hold at this point since the EF may have changed.  Will await for echocardiogram.  In the meantime we will be using beta-blockers with IV and oral metoprolol to control the rate.  Admit the patient and treat for the CHF.  Will need cardiology consult after echocardiogram.  Trend enzymes to rule out ACS as a trigger  #2 acute CHF: Previous EF was normal 2 years ago.  We will get echocardiogram to see if this is diastolic or systolic.  More than likely this is diastolic.  Could have been triggered by both anemia as well as uncontrolled A. fib.  Initiate IV Lasix.  Patient is already on oral Lasix at home.  #3 symptomatic anemia: Hemoglobin 8.5.  Noted gradual decline in hemoglobin over the last couple of months.  We will check stool guaiacs.  Recheck hemoglobin and transfuse if less than 8 g.  #4 essential hypertension: We will use beta-blockers as indicated above.  Monitor blood pressure.  Elevated at this point.  Slight increase in creatinine.  May require ACEI.  #5 non-insulin-dependent diabetes: Sliding scale insulin and monitoring blood sugar.  #6 hypercalcemia: Partial on marginal.  Follow closely.  If calcium continues to rise we may have to work-up probably primary or secondary parathyroidism.  #7 hypothyroidism: Check TSH and continue with levothyroxine  #8 history of DVT: Continue Eliquis  #9 morbid obesity: Dietary counseling  #10 GERD: Continue PPIs.     DVT prophylaxis: Eliquis Code Status: Full code Family Communication: No family at  bedside Disposition Plan: Home Consults called: None but cardiology consult after echo Admission status: Inpatient  Severity of Illness: The appropriate patient status for this patient  is INPATIENT. Inpatient status is judged to be reasonable and necessary in order to provide the required intensity of service to ensure the patient's safety. The patient's presenting symptoms, physical exam findings, and initial radiographic and laboratory data in the context of their chronic comorbidities is felt to place them at high risk for further clinical deterioration. Furthermore, it is not anticipated that the patient will be medically stable for discharge from the hospital within 2 midnights of admission. The following factors support the patient status of inpatient.   " The patient's presenting symptoms include shortness of breath and palpitations. " The worrisome physical exam findings include A. fib with RVR. " The initial radiographic and laboratory data are worrisome because of chest x-ray showing pulmonary edema. " The chronic co-morbidities include A. fib.   * I certify that at the point of admission it is my clinical judgment that the patient will require inpatient hospital care spanning beyond 2 midnights from the point of admission due to high intensity of service, high risk for further deterioration and high frequency of surveillance required.Barbette Merino MD [facility] Pager [phone]  If 7PM-7AM, please contact night-coverage [email or web address] Password [facility]  02/07/2021, 12:21 AM

## 2021-02-07 NOTE — Progress Notes (Signed)
Pt c/o sob with ambulating toi the bathroom, Sat 90-91% on RA. Placed on o2 Sat 97-99% on 2l. Re ports she is feeling better

## 2021-02-08 ENCOUNTER — Ambulatory Visit: Payer: Medicare Other | Admitting: *Deleted

## 2021-02-08 DIAGNOSIS — I4891 Unspecified atrial fibrillation: Secondary | ICD-10-CM

## 2021-02-08 DIAGNOSIS — I10 Essential (primary) hypertension: Secondary | ICD-10-CM

## 2021-02-08 LAB — CBC
HCT: 25.6 % — ABNORMAL LOW (ref 36.0–46.0)
Hemoglobin: 7.7 g/dL — ABNORMAL LOW (ref 12.0–15.0)
MCH: 28.6 pg (ref 26.0–34.0)
MCHC: 30.1 g/dL (ref 30.0–36.0)
MCV: 95.2 fL (ref 80.0–100.0)
Platelets: 249 10*3/uL (ref 150–400)
RBC: 2.69 MIL/uL — ABNORMAL LOW (ref 3.87–5.11)
RDW: 15.4 % (ref 11.5–15.5)
WBC: 6.4 10*3/uL (ref 4.0–10.5)
nRBC: 0 % (ref 0.0–0.2)

## 2021-02-08 LAB — BASIC METABOLIC PANEL
Anion gap: 11 (ref 5–15)
BUN: 28 mg/dL — ABNORMAL HIGH (ref 8–23)
CO2: 25 mmol/L (ref 22–32)
Calcium: 10 mg/dL (ref 8.9–10.3)
Chloride: 103 mmol/L (ref 98–111)
Creatinine, Ser: 1.26 mg/dL — ABNORMAL HIGH (ref 0.44–1.00)
GFR, Estimated: 43 mL/min — ABNORMAL LOW (ref >60)
Glucose, Bld: 108 mg/dL — ABNORMAL HIGH (ref 70–99)
Potassium: 3.2 mmol/L — ABNORMAL LOW (ref 3.5–5.1)
Sodium: 139 mmol/L (ref 135–145)

## 2021-02-08 LAB — MAGNESIUM: Magnesium: 1.8 mg/dL (ref 1.7–2.4)

## 2021-02-08 LAB — GLUCOSE, CAPILLARY
Glucose-Capillary: 113 mg/dL — ABNORMAL HIGH (ref 70–99)
Glucose-Capillary: 121 mg/dL — ABNORMAL HIGH (ref 70–99)
Glucose-Capillary: 133 mg/dL — ABNORMAL HIGH (ref 70–99)
Glucose-Capillary: 136 mg/dL — ABNORMAL HIGH (ref 70–99)

## 2021-02-08 MED ORDER — METOPROLOL TARTRATE 25 MG PO TABS
12.5000 mg | ORAL_TABLET | Freq: Two times a day (BID) | ORAL | Status: DC
  Administered 2021-02-08 – 2021-02-10 (×4): 12.5 mg via ORAL
  Filled 2021-02-08 (×4): qty 1

## 2021-02-08 MED ORDER — POTASSIUM CHLORIDE CRYS ER 20 MEQ PO TBCR
40.0000 meq | EXTENDED_RELEASE_TABLET | Freq: Two times a day (BID) | ORAL | Status: DC
  Administered 2021-02-08 – 2021-02-09 (×4): 40 meq via ORAL
  Filled 2021-02-08 (×4): qty 2

## 2021-02-08 NOTE — Chronic Care Management (AMB) (Signed)
Chronic Care Management   CCM RN Visit Note  02/08/2021 Name: [name] [name] MRN: [id number] DOB: 05-24-1941  Subjective: [name] [name] is a 80 y.o. year old female who is a primary care patient of Loman Brooklyn, FNP. The care management team was consulted for assistance with disease management and care coordination needs.    Collaboration with Dr Percival Spanish and [name], FNP  for  care coordination  in response to provider referral for case management and/or care coordination services.   Consent to Services:  The patient was given information about Chronic Care Management services, agreed to services, and gave verbal consent prior to initiation of services.  Please see initial visit note for detailed documentation.   Patient agreed to services and verbal consent obtained.   Assessment: Review of patient past medical history, allergies, medications, health status, including review of consultants reports, laboratory and other test data, was performed as part of comprehensive evaluation and provision of chronic care management services.   SDOH (Social Determinants of Health) assessments and interventions performed:    CCM Care Plan  No Known Allergies  Facility-Administered Encounter Medications as of 02/08/2021  Medication   acetaminophen (TYLENOL) tablet 650 mg   allopurinol (ZYLOPRIM) tablet 100 mg   apixaban (ELIQUIS) tablet 5 mg   atorvastatin (LIPITOR) tablet 20 mg   cholecalciferol (VITAMIN D) tablet 5,000 Units   furosemide (LASIX) injection 40 mg   insulin aspart (novoLOG) injection 0-15 Units   insulin aspart (novoLOG) injection 0-5 Units   levothyroxine (SYNTHROID) tablet 50 mcg   metoprolol tartrate (LOPRESSOR) tablet 25 mg   ondansetron (ZOFRAN) injection 4 mg   oxybutynin (DITROPAN-XL) 24 hr tablet 10 mg   pantoprazole (PROTONIX) EC tablet 40 mg   Outpatient Encounter Medications as of 02/08/2021  Medication Sig   acetaminophen (TYLENOL) 325 MG  tablet Take 2 tablets (650 mg total) by mouth every 6 (six) hours as needed for mild pain or headache (or Fever >/= 101).   allopurinol (ZYLOPRIM) 100 MG tablet Take 1 tablet (100 mg total) by mouth daily.   apixaban (ELIQUIS) 5 MG TABS tablet Take 1 tablet (5 mg total) by mouth 2 (two) times daily.   atorvastatin (LIPITOR) 20 MG tablet Take 1 tablet (20 mg total) by mouth daily.   Cholecalciferol (VITAMIN D-3) 5000 UNITS TABS Take 5,000 Units by mouth daily.   diclofenac Sodium (VOLTAREN) 1 % GEL APPLY 4 GRAMS TOPICALLY 4  TIMES DAILY (Patient taking differently: Apply 2 g topically daily as needed (knees).)   diltiazem (CARTIA XT) 240 MG 24 hr capsule Take 1 capsule (240 mg total) by mouth daily.   EUTHYROX 50 MCG tablet TAKE 1 TABLET BY MOUTH ONCE DAILY BEFORE BREAKFAST (Patient taking differently: Take 50 mcg by mouth daily before breakfast.)   ferrous sulfate 325 (65 FE) MG tablet Take 325 mg by mouth daily with breakfast.   furosemide (LASIX) 20 MG tablet TAKE 1 TABLET BY MOUTH  TWICE DAILY (Patient taking differently: Take 20 mg by mouth 2 (two) times daily.)   loratadine (CLARITIN) 10 MG tablet Take 10 mg by mouth daily.   ONE TOUCH ULTRA TEST test strip USE UP TO FOUR TIMES DAILY AS DIRECTED (Patient taking differently: 1 each by Other route in the morning, at noon, in the evening, and at bedtime.)   oxybutynin (DITROPAN-XL) 10 MG 24 hr tablet Take 1 tablet (10 mg total) by mouth at bedtime.   pantoprazole (PROTONIX) 40 MG tablet Take 1 tablet (40  mg total) by mouth 2 (two) times daily before a meal.    Patient Active Problem List   Diagnosis Date Noted   Acute CHF (congestive heart failure) ([place]) 02/07/2021   Rapid atrial fibrillation ([place]) 02/07/2021   Hypercalcemia 02/07/2021   A-fib ([place]) 02/04/2021   Educated about COVID-19 virus infection 02/02/2020   Iron deficiency anemia due to chronic blood loss    Occult blood in stools    Unprovoked DVT and Unprovoked Pulmonary Embolism  -Dxed 03/2019 04/03/2019   Sciatica of right side 10/18/2018   Seborrheic keratosis 10/17/2016   Hyperlipidemia associated with type 2 diabetes mellitus ([place]) 10/17/2016   Overactive bladder 10/17/2016   Gastroesophageal reflux disease without esophagitis 10/17/2016   Primary osteoarthritis involving multiple joints 10/17/2016   Chronic gout without tophus 10/17/2016   Type 2 diabetes mellitus without complication, without long-term current use of insulin ([place]) 05/16/2016   Essential hypertension 05/16/2016   Hypothyroidism 05/16/2016   Anemia 10/30/2013   Morbid obesity ([place]) 10/30/2013   S/P right THA, AA 10/29/2013    Conditions to be addressed/monitored:Atrial Fibrillation and CHF  Care Plan : [facility] Care Plan  Updates made by Ilean China, RN since 02/08/2021 12:00 AM     Problem: Chronic Disease Management Needs (DM, HTN, HLD, Afib, chronic anticoagulation)   Priority: Medium     Long-Range Goal: Work with [place] Coordination Regarding Chronic Medical Conditions   Start Date: 02/02/2021  This Visit's Progress: On track  Recent Progress: On track  Priority: Medium  Note:   Current Barriers:  Chronic Disease Management support and education needs related to Atrial Fibrillation, HTN, HLD, and DMII  RNCM Clinical Goal(s):  Patient will work with RN Case Manager to address needs related to Atrial Fibrillation, HTN, HLD, and DMII and recent hospitalization for septic shock secondary to kidney stone and UTI demonstrate ongoing self health care management ability through collaboration with RN Care manager, provider, and care team.   Interventions: 1:1 collaboration with Loman Brooklyn, FNP regarding development and update of comprehensive plan of care as evidenced by provider attestation and co-signature  Inter-disciplinary care team collaboration (see longitudinal plan of care) Reviewed chart including relevant office notes, lab results, and  consultation reports   Diabetes:  (Status: Goal on track: YES. No changes this visit.) Lab Results  Component Value Date   HGBA1C 5.7 (H) 12/25/2020  Assessed patient's understanding of A1c goal: <7% Reviewed and discussed most recent A1C Reviewed medications with patient and discussed importance of medication adherence; Counseled on importance of regular laboratory monitoring as prescribed; Discussed plans with patient for ongoing care management follow up and provided patient with direct contact information for care management team; Reviewed scheduled/upcoming provider appointments including: Lottie Dawson, PharmD on 02/12/21 for prescription assistance; Encouraged to continue to follow an ADA/carb modified diet  Hypertension: (Status: Goal on track: YES. No changes this visit.) Last practice recorded BP readings:  BP Readings from Last 3 Encounters:  01/21/21 122/79  01/15/21 (!) 146/100  01/11/21 (!) 145/85  Most recent eGFR/CrCl:  Lab Results  Component Value Date   EGFR 59 (L) 01/05/2021    No components found for: CRCL  Evaluation of current treatment plan related to hypertension self management and patient's adherence to plan as established by provider; Reviewed medications with patient and discussed importance of compliance; Discussed plans with patient for ongoing care management follow up and provided patient with direct contact information for care management team; Advised patient, providing education  and rationale, to monitor blood pressure daily and record, calling PCP for findings outside established parameters;  Reviewed scheduled/upcoming provider appointments including:  04/03/21 with PCP  Reviewed and discussed recent office visit with cardiologist  Questioned mobility and physical activity level. Both are improving. Physical and occupational therapy have ended. Continues to use a walker from time to time but is mostly ambulating on her own without difficulty. She is  able to perform ADLs independently.  Discussed oxygen use. Patient is not using oxygen and is waiting for supplier to pick it up. O2 levels have been normal during the day and night.   HDL  (Status: Goal on track: YES. No changes this visit.) Evaluation of current treatment plan related to HLD self-management and patient's adherence to plan as established by provider. Discussed plans with patient for ongoing care management follow up and provided patient with direct contact information for care management team Reviewed and discussed recent lab results Reviewed and discussed medications and compliance Encouraged patient to continue increasing physical activity level as tolerated  A-fib:  (Status: Goal on track: NO. Currently inpatient for Afib and new onset CHF ) Counseled on increased risk of stroke due to Afib and benefits of anticoagulation for stroke prevention; Reviewed importance of adherence to anticoagulant exactly as prescribed; Assessed social determinant of health barriers;  Cost of eliquis. Scheduled to see PharmD. Reviewed requested lab result from 7/5 from [facility]. Discussed elev PTH and high normnal calcium with PCP and cardiologist.    Collaborated with PCP and cardiologist on 02/04/21 regarding patient's episodes of falling asleep and waking up feeling short of breath with a pulse between 100-120 and normal O2 level. Cardiologist recommended to check and record pulse and oxygen level for 3-4 days and then report those readings to him. She was advised of this by cardio nursing staff.  Patient was advised patient to seek appropriate medical attention for any new or worsening symptoms Previousy provided patient with RNCM contact number and reminded to reach out as needed Chart reviewed today and patient is inpatient at [facility] for Afib and new onset CHF. Being followed by cardiology. Echo was abnormal. No anticipated discharge date as of yet.  RNCM will monitor inpatient  status and follow-up by telephone once discharged  Patient Goals/Self-Care Activities: Patient will self administer medications as prescribed Patient will attend all scheduled provider appointments Patient will continue to perform ADL's independently Patient will call provider office for new concerns or questions  Follow Up Plan:  Telephone follow up appointment with care management team member scheduled for: 03/18/21 with [facility] for routine followup The patient has been provided with contact information for the care management team and has been advised to call with any health related questions or concerns.       Plan:Telephone follow up appointment with care management team member scheduled for:  03/18/21 with [facility] for routine chronic follow-up. RNCM will follow-up by telephone after hospital discharge.   Chong Sicilian, BSN, RN-BC Embedded Chronic Care Manager [facility] [place] Management Direct Dial: [phone]

## 2021-02-08 NOTE — Patient Instructions (Signed)
Visit Information  PATIENT GOALS:  Goals Addressed             This Visit's Progress    Track and Manage Heart Rate and Rhythm-Atrial Fibrillation   Not on track    Timeframe:  Long-Range Goal Priority:  High Start Date:   02/04/21                          Expected End Date:    02/04/22                   Follow Up Date 03/18/21    Keep a symptoms diary Check and record pulse rate daily and as needed Keep all medical appointments Take medications as prescribed Talk with PharmD about Eliquis prescription assistance Call PCP or cardiologist with any new or worsening symptoms Seek appropriate medical attention for any new or worsening symptoms Call [facility] as needed [phone]    Why is this important?   Atrial fibrillation may have no symptoms. Sometimes the symptoms get worse or happen more often.  It is important to keep track of what your symptoms are and when they happen.  A change in symptoms is important to discuss with your doctor or nurse.  Being active and healthy eating will also help you manage your heart condition.     Notes:         Telephone follow up appointment with care management team member scheduled for:  03/18/21 with [facility] for routine chronic follow-up. RNCM will follow-up by telephone after hospital discharge.   Chong Sicilian, BSN, RN-BC Embedded Chronic Care Manager [facility] / [place] Management Direct Dial: [phone]

## 2021-02-08 NOTE — Progress Notes (Signed)
[name] [name]  TDS:[id number] DOB: 05/24/1941 DOA: 02/06/2021 PCP: Loman Brooklyn, FNP    Brief Narrative:  [phone] with a history of chronic atrial fibrillation on Eliquis, DM2, HTN, hypothyroidism, morbid obesity, DVT and pulmonary embolism, osteoarthritis, and GERD who presented to the ER with palpitations and shortness of breath.  This was associated with gradually worsening pedal edema and orthopnea.  In the ER she was found to have heart rates ranging between 120-160.  Exam and CXR were also consistent with pulmonary edema.  Significant Events:  7/17 admit via ER 7/17 TTE EF 35-40% - LV global hypokinesis - B atrial enlargement L > R - severe mitral regurg - mod tricuspid regurg   Consultants:  None  Code Status: FULL CODE  Antimicrobials:  None  DVT prophylaxis: Eliquis  Subjective: Continues to feel short of breath with exertion.  Somewhat less short of breath when resting in a chair.  Able to be liberated from oxygen when at rest.  Denies chest pain nausea or vomiting.  Reports good appetite.  Assessment & Plan:  Chronic atrial fibrillation with acute RVR Patient was cardioverted in the ED first with 120 J biphasic then 200 J ultimately resulting in conversion to normal sinus rhythm - remains in sinus rhythm for now -TSH within normal range -magnesium 1.8  Acute newly diagnosed severe systolic CHF -pulmonary edema Likely rate related due to afib RVR - EF normal on TTE Sept 2020 - f/u TTE this admit notes severe systolic failure w/ global LV hypokinesis - continue diuresis - watch renal function - strict Is/Os - consult Cards for eval   Filed Weights   02/06/21 2155 02/07/21 0115 02/08/21 0500  Weight: 93 kg 94.6 kg 94.4 kg    Newly diagnosed Mitral and Tricuspid valve regurgitation  For Cards eval   Acute kidney injury Creatinine 1.43 at presentation with baseline creatinine 0.97 - improving w/ diuresis   Recent Labs  Lab 02/06/21 2132 02/07/21 0528  02/08/21 0400  CREATININE 1.43* 1.47* 1.26*    Hypokalemia Due to poor intake and diuresis - supplement and follow - Mg is ok   Chronic Normocytic anemia Hemoglobin 8.5 at presentation - checking guaiac on this patient on chronic anticoagulation - appears hemoglobin has slowly been trending down over the last month - pt is on oral Fe tx already   Recent Labs  Lab 02/06/21 2132 02/07/21 0528 02/08/21 0400  HGB 8.5* 8.0* 7.7*     Nephrolothiasis w/ hx of Urosepsis s/p cystoscopy/ureteroscopy w/ laser litho and basket extraction stones & R ureteral stent exchange 01/15/21 (Dr. Abner Greenspan) - no clinical evidence of current complications   HTN Blood pressure currently well controlled  DM2 CBG well controlled at present  Hypothyroidism Appears reasonably well managed with Synthroid  History of DVT/PE Continue Eliquis for now   GERD  Morbid obesity - Body mass index is 36.87 kg/m.    Family Communication: No family present at time of exam Status is: Inpatient  Remains inpatient appropriate because:Inpatient level of care appropriate due to severity of illness  Dispo: The patient is from: Home              Anticipated d/c is to:  Unclear              Patient currently is not medically stable to d/c.   Difficult to place patient No   Objective: Blood pressure 125/86, pulse 82, temperature 98.4 F (36.9 C), temperature source Oral, resp. rate 20, height 5'  3" (1.6 m), weight 94.4 kg, SpO2 97 %.  Intake/Output Summary (Last 24 hours) at 02/08/2021 0932 Last data filed at 02/08/2021 0900 Gross per 24 hour  Intake 240 ml  Output 1500 ml  Net -1260 ml    Filed Weights   02/06/21 2155 02/07/21 0115 02/08/21 0500  Weight: 93 kg 94.6 kg 94.4 kg    Examination: General: No acute respiratory distress Lungs: Mild bibasilar crackles with no wheezing Cardiovascular: Regular rate and rhythm -no gallop or rub Abdomen: Nontender, nondistended, soft, bowel sounds positive, no  rebound, no ascites, no appreciable mass Extremities: 2+ bilateral lower extremity edema   CBC: Recent Labs  Lab 02/06/21 2132 02/07/21 0528 02/08/21 0400  WBC 7.0 6.6 6.4  HGB 8.5* 8.0* 7.7*  HCT 27.9* 27.2* 25.6*  MCV 93.3 95.1 95.2  PLT 299 276 573    Basic Metabolic Panel: Recent Labs  Lab 02/06/21 2132 02/07/21 0528 02/08/21 0400  NA 135 137 139  K 3.8 3.7 3.2*  CL 104 104 103  CO2 22 23 25   GLUCOSE 149* 143* 108*  BUN 32* 32* 28*  CREATININE 1.43* 1.47* 1.26*  CALCIUM 10.7* 10.2 10.0  MG  --   --  1.8    GFR: Estimated Creatinine Clearance: 39.6 mL/min (A) (by C-G formula based on SCr of 1.26 mg/dL (H)).  Liver Function Tests: No results for input(s): AST, ALT, ALKPHOS, BILITOT, PROT, ALBUMIN in the last 168 hours. No results for input(s): LIPASE, AMYLASE in the last 168 hours. No results for input(s): AMMONIA in the last 168 hours.  Coagulation Profile: Recent Labs  Lab 02/06/21 2132  INR 1.5*      HbA1C: HB A1C (BAYER DCA - WAIVED)  Date/Time Value Ref Range Status  10/20/2020 09:50 AM 5.7 <7.0 % Final    Comment:                                          Diabetic Adult            <7.0                                       Healthy Adult        4.3 - 5.7                                                           (DCCT/NGSP) American Diabetes Association's Summary of Glycemic Recommendations for Adults with Diabetes: Hemoglobin A1c <7.0%. More stringent glycemic goals (A1c <6.0%) may further reduce complications at the cost of increased risk of hypoglycemia.   06/04/2020 08:08 AM 5.4 <7.0 % Final    Comment:                                          Diabetic Adult            <7.0  Healthy Adult        4.3 - 5.7                                                           (DCCT/NGSP) American Diabetes Association's Summary of Glycemic Recommendations for Adults with Diabetes: Hemoglobin A1c <7.0%. More  stringent glycemic goals (A1c <6.0%) may further reduce complications at the cost of increased risk of hypoglycemia.    Hgb A1c MFr Bld  Date/Time Value Ref Range Status  12/25/2020 02:53 AM 5.7 (H) 4.8 - 5.6 % Final    Comment:    (NOTE)         Prediabetes: 5.7 - 6.4         Diabetes: >6.4         Glycemic control for adults with diabetes: <7.0     CBG: Recent Labs  Lab 02/07/21 0758 02/07/21 1124 02/07/21 1619 02/07/21 2129 02/08/21 0731  GLUCAP 114* 136* 114* 123* 113*     Recent Results (from the past 240 hour(s))  Resp Panel by RT-PCR (Flu A&B, Covid) Nasopharyngeal Swab     Status: None   Collection Time: 02/07/21  3:04 AM   Specimen: Nasopharyngeal Swab; Nasopharyngeal(NP) swabs in vial transport medium  Result Value Ref Range Status   SARS Coronavirus 2 by RT PCR NEGATIVE NEGATIVE Final    Comment: (NOTE) SARS-CoV-2 target nucleic acids are NOT DETECTED.  The SARS-CoV-2 RNA is generally detectable in upper respiratory specimens during the acute phase of infection. The lowest concentration of SARS-CoV-2 viral copies this assay can detect is 138 copies/mL. A negative result does not preclude SARS-Cov-2 infection and should not be used as the sole basis for treatment or other patient management decisions. A negative result may occur with  improper specimen collection/handling, submission of specimen other than nasopharyngeal swab, presence of viral mutation(s) within the areas targeted by this assay, and inadequate number of viral copies(<138 copies/mL). A negative result must be combined with clinical observations, patient history, and epidemiological information. The expected result is Negative.  Fact Sheet for Patients:  EntrepreneurPulse.com.au  Fact Sheet for Healthcare Providers:  IncredibleEmployment.be  This test is no t yet approved or cleared by the [place] FDA and  has been authorized for detection  and/or diagnosis of SARS-CoV-2 by FDA under an Emergency Use Authorization (EUA). This EUA will remain  in effect (meaning this test can be used) for the duration of the COVID-19 declaration under Section 564(b)(1) of the Act, 21 U.S.C.section 360bbb-3(b)(1), unless the authorization is terminated  or revoked sooner.       Influenza A by PCR NEGATIVE NEGATIVE Final   Influenza B by PCR NEGATIVE NEGATIVE Final    Comment: (NOTE) The Xpert Xpress SARS-CoV-2/FLU/RSV plus assay is intended as an aid in the diagnosis of influenza from Nasopharyngeal swab specimens and should not be used as a sole basis for treatment. Nasal washings and aspirates are unacceptable for Xpert Xpress SARS-CoV-2/FLU/RSV testing.  Fact Sheet for Patients: EntrepreneurPulse.com.au  Fact Sheet for Healthcare Providers: IncredibleEmployment.be  This test is not yet approved or cleared by the [place] FDA and has been authorized for detection and/or diagnosis of SARS-CoV-2 by FDA under an Emergency Use Authorization (EUA). This EUA will remain in effect (meaning this test can be used) for the duration  of the COVID-19 declaration under Section 564(b)(1) of the Act, 21 U.S.C. section 360bbb-3(b)(1), unless the authorization is terminated or revoked.  Performed at [facility], [place] [address], [place],  [address]       Scheduled Meds:  allopurinol  100 mg Oral Daily   apixaban  5 mg Oral BID   atorvastatin  20 mg Oral Daily   cholecalciferol  5,000 Units Oral Daily   furosemide  40 mg Intravenous Q12H   insulin aspart  0-15 Units Subcutaneous TID WC   insulin aspart  0-5 Units Subcutaneous QHS   levothyroxine  50 mcg Oral Q0600   metoprolol tartrate  25 mg Oral BID   oxybutynin  10 mg Oral QHS   pantoprazole  40 mg Oral BID AC      LOS: 1 day   Cherene Altes, MD [facility] Office  [phone] Pager - Text Page per  Shea Evans  If 7PM-7AM, please contact night-coverage per Amion 02/08/2021, 9:32 AM

## 2021-02-09 ENCOUNTER — Telehealth: Payer: Self-pay | Admitting: Family Medicine

## 2021-02-09 DIAGNOSIS — I5041 Acute combined systolic (congestive) and diastolic (congestive) heart failure: Secondary | ICD-10-CM | POA: Diagnosis present

## 2021-02-09 DIAGNOSIS — R7989 Other specified abnormal findings of blood chemistry: Secondary | ICD-10-CM

## 2021-02-09 DIAGNOSIS — I340 Nonrheumatic mitral (valve) insufficiency: Secondary | ICD-10-CM

## 2021-02-09 DIAGNOSIS — I5043 Acute on chronic combined systolic (congestive) and diastolic (congestive) heart failure: Secondary | ICD-10-CM | POA: Diagnosis present

## 2021-02-09 LAB — CBC
HCT: 28.1 % — ABNORMAL LOW (ref 36.0–46.0)
Hemoglobin: 8.1 g/dL — ABNORMAL LOW (ref 12.0–15.0)
MCH: 28.1 pg (ref 26.0–34.0)
MCHC: 28.8 g/dL — ABNORMAL LOW (ref 30.0–36.0)
MCV: 97.6 fL (ref 80.0–100.0)
Platelets: 249 10*3/uL (ref 150–400)
RBC: 2.88 MIL/uL — ABNORMAL LOW (ref 3.87–5.11)
RDW: 15.3 % (ref 11.5–15.5)
WBC: 5.5 10*3/uL (ref 4.0–10.5)
nRBC: 0 % (ref 0.0–0.2)

## 2021-02-09 LAB — BASIC METABOLIC PANEL
Anion gap: 10 (ref 5–15)
BUN: 37 mg/dL — ABNORMAL HIGH (ref 8–23)
CO2: 22 mmol/L (ref 22–32)
Calcium: 10.4 mg/dL — ABNORMAL HIGH (ref 8.9–10.3)
Chloride: 105 mmol/L (ref 98–111)
Creatinine, Ser: 1.52 mg/dL — ABNORMAL HIGH (ref 0.44–1.00)
GFR, Estimated: 35 mL/min — ABNORMAL LOW (ref >60)
Glucose, Bld: 134 mg/dL — ABNORMAL HIGH (ref 70–99)
Potassium: 4.8 mmol/L (ref 3.5–5.1)
Sodium: 137 mmol/L (ref 135–145)

## 2021-02-09 LAB — GLUCOSE, CAPILLARY
Glucose-Capillary: 108 mg/dL — ABNORMAL HIGH (ref 70–99)
Glucose-Capillary: 120 mg/dL — ABNORMAL HIGH (ref 70–99)
Glucose-Capillary: 121 mg/dL — ABNORMAL HIGH (ref 70–99)
Glucose-Capillary: 106 mg/dL — ABNORMAL HIGH (ref 70–99)

## 2021-02-09 LAB — MAGNESIUM: Magnesium: 1.8 mg/dL (ref 1.7–2.4)

## 2021-02-09 MED ORDER — FUROSEMIDE 40 MG PO TABS
40.0000 mg | ORAL_TABLET | Freq: Every day | ORAL | Status: DC
  Administered 2021-02-10: 40 mg via ORAL
  Filled 2021-02-09: qty 1

## 2021-02-09 NOTE — Progress Notes (Signed)
[name] [name]  XNA:[id number] DOB: 1941/01/30 DOA: 02/06/2021 PCP: Loman Brooklyn, FNP    Brief Narrative:  [phone] with a history of chronic atrial fibrillation on Eliquis, DM2, HTN, hypothyroidism, morbid obesity, DVT and pulmonary embolism, osteoarthritis, and GERD who presented to the ER with palpitations and shortness of breath.  This was associated with gradually worsening pedal edema and orthopnea.  In the ER she was found to have heart rates ranging between 120-160.  Exam and CXR were consistent with pulmonary edema.  Significant Events:  7/17 admit via ER 7/17 TTE EF 35-40% - LV global hypokinesis - B atrial enlargement L > R - severe mitral regurg - mod tricuspid regurg   Consultants:  [facility]   Code Status: FULL CODE  Antimicrobials:  None  DVT prophylaxis: Eliquis  Subjective: Feels "a little bit better" today.  Denies chest pain.  Does report generalized weakness and dyspnea on exertion.  No abdominal pain.  Good appetite.  Assessment & Plan:  Chronic atrial fibrillation with acute RVR cardioverted in the ED first with 120 J biphasic then 200 J ultimately resulting in conversion to normal sinus rhythm - remains in sinus rhythm for now -TSH within normal range - magnesium 1.8  Acute newly diagnosed severe systolic CHF -pulmonary edema Likely rate related due to afib RVR - EF normal on TTE Sept 2020 - f/u TTE this admit notes severe systolic failure w/ global LV hypokinesis - continue diuresis - watch renal function - net negative at least 1L thus far, but collection has been somewhat inaccurate - consulted Cards for eval (followed by Dr. Percival Spanish)  Filed Weights   02/07/21 0115 02/08/21 0500 02/09/21 0500  Weight: 94.6 kg 94.4 kg 93.2 kg    Newly diagnosed Mitral and Tricuspid valve regurgitation  For Cards eval   Acute kidney injury Creatinine 1.43 at presentation with baseline creatinine 0.97 - initially improved w/ diuresis, but now trending back  upward - may have reached inflection point where further diuresis will be a challenge - clinically remains overloaded on exam - follow daily - no ACEi  Recent Labs  Lab 02/06/21 2132 02/07/21 0528 02/08/21 0400 02/09/21 0419  CREATININE 1.43* 1.47* 1.26* 1.52*    Hypokalemia Due to poor intake and diuresis - corrected   Chronic Normocytic anemia Hemoglobin 8.5 at presentation - checking guaiac on this patient on chronic anticoagulation - appears hemoglobin has slowly been trending down over the last month - pt is on oral Fe tx already - Hgb holding steady for now   Recent Labs  Lab 02/06/21 2132 02/07/21 0528 02/08/21 0400 02/09/21 0419  HGB 8.5* 8.0* 7.7* 8.1*    Nephrolothiasis w/ hx of Urosepsis s/p cystoscopy/ureteroscopy w/ laser litho and basket extraction stones & R ureteral stent exchange 01/15/21 (Dr. Abner Greenspan) - no clinical evidence of current complications - consider renal US if crt climbs further    HTN Blood pressure controlled  DM2 CBG well controlled at present  Hypothyroidism Appears reasonably well managed with Synthroid - would not push TSH down further in setting of Afib   History of DVT/PE Continue Eliquis for now   GERD  Morbid obesity - Body mass index is 36.4 kg/m.    Family Communication: No family present at time of exam Status is: Inpatient  Remains inpatient appropriate because:Inpatient level of care appropriate due to severity of illness  Dispo: The patient is from: Home              Anticipated  d/c is to:  Unclear              Patient currently is not medically stable to d/c.   Difficult to place patient No   Objective: Blood pressure 128/86, pulse 75, temperature 97.8 F (36.6 C), temperature source Oral, resp. rate 18, height 5\' 3"  (1.6 m), weight 93.2 kg, SpO2 99 %.  Intake/Output Summary (Last 24 hours) at 02/09/2021 0750 Last data filed at 02/08/2021 1800 Gross per 24 hour  Intake 480 ml  Output 750 ml  Net -270 ml     Filed Weights   02/07/21 0115 02/08/21 0500 02/09/21 0500  Weight: 94.6 kg 94.4 kg 93.2 kg    Examination: General: No acute respiratory distress Lungs: Good air movement throughout with exception to mild bibasilar crackles Cardiovascular: Regular rate and rhythm  Abdomen: NT/ND, soft, BS positive, no rebound Extremities: 2+ bilateral lower extremity edema without significant change   CBC: Recent Labs  Lab 02/07/21 0528 02/08/21 0400 02/09/21 0419  WBC 6.6 6.4 5.5  HGB 8.0* 7.7* 8.1*  HCT 27.2* 25.6* 28.1*  MCV 95.1 95.2 97.6  PLT 276 249 165    Basic Metabolic Panel: Recent Labs  Lab 02/07/21 0528 02/08/21 0400 02/09/21 0419  NA 137 139 137  K 3.7 3.2* 4.8  CL 104 103 105  CO2 23 25 22   GLUCOSE 143* 108* 134*  BUN 32* 28* 37*  CREATININE 1.47* 1.26* 1.52*  CALCIUM 10.2 10.0 10.4*  MG  --  1.8 1.8    GFR: Estimated Creatinine Clearance: 32.5 mL/min (A) (by C-G formula based on SCr of 1.52 mg/dL (H)).  Liver Function Tests: No results for input(s): AST, ALT, ALKPHOS, BILITOT, PROT, ALBUMIN in the last 168 hours. No results for input(s): LIPASE, AMYLASE in the last 168 hours. No results for input(s): AMMONIA in the last 168 hours.  Coagulation Profile: Recent Labs  Lab 02/06/21 2132  INR 1.5*      HbA1C: HB A1C (BAYER DCA - WAIVED)  Date/Time Value Ref Range Status  10/20/2020 09:50 AM 5.7 <7.0 % Final    Comment:                                          Diabetic Adult            <7.0                                       Healthy Adult        4.3 - 5.7                                                           (DCCT/NGSP) American Diabetes Association's Summary of Glycemic Recommendations for Adults with Diabetes: Hemoglobin A1c <7.0%. More stringent glycemic goals (A1c <6.0%) may further reduce complications at the cost of increased risk of hypoglycemia.   06/04/2020 08:08 AM 5.4 <7.0 % Final    Comment:  Diabetic Adult            <7.0                                       Healthy Adult        4.3 - 5.7                                                           (DCCT/NGSP) American Diabetes Association's Summary of Glycemic Recommendations for Adults with Diabetes: Hemoglobin A1c <7.0%. More stringent glycemic goals (A1c <6.0%) may further reduce complications at the cost of increased risk of hypoglycemia.    Hgb A1c MFr Bld  Date/Time Value Ref Range Status  12/25/2020 02:53 AM 5.7 (H) 4.8 - 5.6 % Final    Comment:    (NOTE)         Prediabetes: 5.7 - 6.4         Diabetes: >6.4         Glycemic control for adults with diabetes: <7.0     CBG: Recent Labs  Lab 02/08/21 0731 02/08/21 1204 02/08/21 1636 02/08/21 2024 02/09/21 0724  GLUCAP 113* 121* 133* 136* 108*     Recent Results (from the past 240 hour(s))  Resp Panel by RT-PCR (Flu A&B, Covid) Nasopharyngeal Swab     Status: None   Collection Time: 02/07/21  3:04 AM   Specimen: Nasopharyngeal Swab; Nasopharyngeal(NP) swabs in vial transport medium  Result Value Ref Range Status   SARS Coronavirus 2 by RT PCR NEGATIVE NEGATIVE Final    Comment: (NOTE) SARS-CoV-2 target nucleic acids are NOT DETECTED.  The SARS-CoV-2 RNA is generally detectable in upper respiratory specimens during the acute phase of infection. The lowest concentration of SARS-CoV-2 viral copies this assay can detect is 138 copies/mL. A negative result does not preclude SARS-Cov-2 infection and should not be used as the sole basis for treatment or other patient management decisions. A negative result may occur with  improper specimen collection/handling, submission of specimen other than nasopharyngeal swab, presence of viral mutation(s) within the areas targeted by this assay, and inadequate number of viral copies(<138 copies/mL). A negative result must be combined with clinical observations, patient history, and epidemiological information.  The expected result is Negative.  Fact Sheet for Patients:  EntrepreneurPulse.com.au  Fact Sheet for Healthcare Providers:  IncredibleEmployment.be  This test is no t yet approved or cleared by the [place] FDA and  has been authorized for detection and/or diagnosis of SARS-CoV-2 by FDA under an Emergency Use Authorization (EUA). This EUA will remain  in effect (meaning this test can be used) for the duration of the COVID-19 declaration under Section 564(b)(1) of the Act, 21 U.S.C.section 360bbb-3(b)(1), unless the authorization is terminated  or revoked sooner.       Influenza A by PCR NEGATIVE NEGATIVE Final   Influenza B by PCR NEGATIVE NEGATIVE Final    Comment: (NOTE) The Xpert Xpress SARS-CoV-2/FLU/RSV plus assay is intended as an aid in the diagnosis of influenza from Nasopharyngeal swab specimens and should not be used as a sole basis for treatment. Nasal washings and aspirates are unacceptable for Xpert Xpress SARS-CoV-2/FLU/RSV testing.  Fact Sheet for Patients: EntrepreneurPulse.com.au  Fact Sheet  for Healthcare Providers: IncredibleEmployment.be  This test is not yet approved or cleared by the [place] and has been authorized for detection and/or diagnosis of SARS-CoV-2 by FDA under an Emergency Use Authorization (EUA). This EUA will remain in effect (meaning this test can be used) for the duration of the COVID-19 declaration under Section 564(b)(1) of the Act, 21 U.S.C. section 360bbb-3(b)(1), unless the authorization is terminated or revoked.  Performed at [facility], [place] [address] [place], [place] [address]       Scheduled Meds:  allopurinol  100 mg Oral Daily   apixaban  5 mg Oral BID   atorvastatin  20 mg Oral Daily   cholecalciferol  5,000 Units Oral Daily   furosemide  40 mg Intravenous Q12H   insulin aspart  0-15 Units Subcutaneous TID WC    insulin aspart  0-5 Units Subcutaneous QHS   levothyroxine  50 mcg Oral Q0600   metoprolol tartrate  12.5 mg Oral BID   oxybutynin  10 mg Oral QHS   pantoprazole  40 mg Oral BID AC   potassium chloride  40 mEq Oral BID      LOS: 2 days   Cherene Altes, MD [facility] Office  [phone] Pager - Text Page per Shea Evans  If 7PM-7AM, please contact night-coverage per Amion 02/09/2021, 7:50 AM

## 2021-02-09 NOTE — Consult Note (Addendum)
Cardiology Consultation:   Patient ID: [name] [name] MRN: [id number]; DOB: [birth year]  Admit date: 02/06/2021 Date of Consult: 02/09/2021  PCP:  Loman Brooklyn, [facility] Providers Cardiologist:  Minus Breeding, MD   {   Patient Profile:   [name] [name] is a 80 y.o. female with a hx of  HTN, history of unprovoked PE/DVT on Eliquis, persistent atrial fibrillation, type 2 diabetes, obesity, hypothyroidism, GERD, chronic anemia, , who is being seen 02/09/2021 for the evaluation of acute systolic heart failure at the request of Dr. Thereasa Solo.   History of Present Illness:   [name] [name] follows Dr. Percival Spanish outpatient for hypertension, asymptomatic bradycardia, recently diagnosed persistent A. Fib.    Historically she has been noted with asymptomatic bradycardia, remote atypical chest pain that further ischemic evaluation was not felt necessary.  She was taking amlodipine 10 mg and Lasix 20 mg twice daily for hypertension with fair control.  Echo from 04/03/2019 revealed EF 60 to 65%, mildly increased LV wall thickness, impaired relaxation, elevated mean LA pressure, severely dilated LA, mildly dilated RA, no significant valvular disease.  Event monitor from 02/07/2020 revealed no significant arrhythmia and occasional SVT with longest run being 11 beats.  Occasional isolated PVC.  Medically she has a history of IDA, evaluated by GI in the past for heme positive stool with concurrent Eliquis use (for history of unprovoked PE/DVT ) during hospitalization requiring blood transfusion. Colonoscopy in 11/2019 revealed polyps, diverticulosis, colonic lipoma.  EGD with normal esophagus with possible early GAVE, normal duodenum, gastric biopsy was negative for H. pylori.  Subsequent capsule study revealed scattered small bowel erosions, abnormal small bowel with edema, inflammation/erosion.  CTA with SMA noting 50% stenosis, IMA and celiac patent.  Patient opted to hold off CTE for further  small bowel evaluation.  She was placed on iron supplement, advised to follow-up with GI, last seen on 11/03/2020.   She was recently hospitalized at [facility] from 12/24/20-12/30/2020 for septic shock due to complicated UTI due to right ureteral stone with right hydroureteronephrosis, underwent right ureteral stent placement on 12/25/2020.  During the hospitalization, she developed new onset of persistent atrial fibrillation with RVR, had mild elevation for Hs trop 280 >256 that was felt due to demand, she was treated with amiodarone (due to initial hypotension), and later transition to diltiazem, continued anticoagulation with Eliquis for history of PE.  She was arranged to follow-up with cardiology outpatient.  Her amlodipine and ARB were discontinued given initiation of diltiazem for hypertension.  Her simvastatin was changed to atorvastatin at the time of discharge.  She was evaluated by Dr. Percival Spanish in the office on 01/08/2021 for hospital follow-up and preop evaluation for cystoscopy.  Her Eliquis was increased from 2.5 to 5 mg BID for A fib anticoagulation.  She was educated on rate monitor at home for atrial fibrillation and continued on diltiazem XR 240 mg for rate control.  She was asymptomatic from persistent A. fib and cardioversion was not felt necessary.  She was advised to hold Eliquis 3 days prior to cystoscopy.  No significant contraindication was noted before cystoscopy.  She underwent cystoscopy and right ureteroscopy with laser lithotripsy on 01/15/2021 by Dr. Abner Greenspan.   Patient presented to the [facility] ER on 02/06/2021 for increased leg swelling, rapid heart rate 120-160s at home, worsening of shortness of breath , PND and orthopnea. She recalls that her symptoms started 2 weeks after her cystoscopy. She has been drinking lot of fluids daily as  instructed by her urologist to ensure 2L UOP daily. She notes progressive worsening SOB with exertion, leg edema, and intermittent heart palpitation with dizziness  over the past 2 weeks. Over the past 3 days, she has felt her heart is racing frequently with home monitor showing HR >120 most of the time. She felt increasingly dizzy and almost passed out several time. She endorses orthopnea, PND. She never experienced any chest pain or pressure.  She was compliant with her diltiazem and Eliquis. Therefore she came to ER for evaluation.  She was found to be in A. fib with RVR at ED, received cardioversion with 120 J and 200 J at ED with conversion to sinus rhythm ultimately. She denied ever having ischemic evaluation. She denied noticing any bleeding, reports stool is chronically black on iron.   Admission diagnostic revealed AKI with elevated BUN 32, creatinine 1.43 and GFR of 37.  CBC revealed worsening of chronic anemia with hemoglobin of 8.5.  INR 1.5.  TSH and Mag WNL.  Flu and COVID-negative. CXR revealed enlarged cardiac silhouette with interstitial pulmonary edema and small bilateral pleural effusion.  Initial EKG on 02/06/2021 and 2123 revealed A. fib with RVR at 145 bpm.  Subsequent EKG on 02/06/2021 at 2335 revealed conversion to sinus rhythm with ventricular rate of 96, occasional PVC.  She was subsequently admitted to hospital medicine.  She was started on IV Lasix 40 mg q12h >daily since admission.  Echo from 02/07/2021 revealed EF of 35 to 40%, LV global hypokinesis, average LV global longitudinal strain is -8.8%, RV systolic function mildly reduced, RV size mildly enlarged, moderate elevated PASP with estimated RVSP 51.5 mmHg, severely dilated LA, moderate dilated RA, severe MR moderate TR, mild aortic sclerosis.  Cardiology is consulted given significant change of echocardiogram.      Past Medical History:  Diagnosis Date   Anemia    Arthritis    Ostearthritis- hips, knees, fingers   Diabetes mellitus without complication (HCC)    DVT (deep venous thrombosis) (HCC)    Dyspnea    GERD (gastroesophageal reflux disease)    Headache(784.0)    tx.  Valproic acid   History of kidney stones    Hypertension    Hypothyroidism    Pulmonary embolism [facility])     Past Surgical History:  Procedure Laterality Date   ABDOMINAL HYSTERECTOMY     BILATERAL HIP ARTHROSCOPY Left    BIOPSY  12/08/2019   Procedure: BIOPSY;  Surgeon: Daneil Dolin, MD;  Location: [facility];  Service: Endoscopy;;   CATARACT EXTRACTION, BILATERAL     COLONOSCOPY N/A 12/08/2019   polyps (tubular adenoma), diverticulosis, colonic lipoma, no surveillance due to age   70 W/ [place] Right 12/25/2020   Procedure: CYSTOSCOPY WITH RETROGRADE PYELOGRAM/URETERAL STENT PLACEMENT;  Surgeon: Janith Lima, MD;  Location: [facility];  Service: Urology;  Laterality: Right;   CYSTOSCOPY/URETEROSCOPY/HOLMIUM LASER/STENT PLACEMENT Right 01/15/2021   Procedure: CYSTOSCOPY/RETROGRADE/URETEROSCOPY/HOLMIUM LASER/STENT EXCHANGE;  Surgeon: Janith Lima, MD;  Location: [facility];  Service: Urology;  Laterality: Right;   ESOPHAGOGASTRODUODENOSCOPY N/A 12/08/2019   normal esophagus with 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   GIVENS CAPSULE STUDY N/A 01/13/2020   Procedure: GIVENS CAPSULE STUDY;  Surgeon: Daneil Dolin, MD;  Location: [facility];  Service: Endoscopy;  Laterality: N/A;  7:30am   MULTIPLE TOOTH EXTRACTIONS     60's   PARATHYROIDECTOMY     POLYPECTOMY  12/08/2019   Procedure: POLYPECTOMY;  Surgeon: Gala Romney,  Cristopher Estimable, MD;  Location: [facility];  Service: Endoscopy;;   TOTAL HIP ARTHROPLASTY Right 10/29/2013   Procedure: RIGHT TOTAL HIP ARTHROPLASTY ANTERIOR APPROACH;  Surgeon: Mauri Pole, MD;  Location: [facility];  Service: Orthopedics;  Laterality: Right;     Home Medications:  Prior to Admission medications   Medication Sig Start Date End Date Taking? Authorizing Provider  acetaminophen (TYLENOL) 325 MG tablet Take 2 tablets (650 mg total) by mouth every 6 (six) hours as needed for mild pain or headache (or  Fever >/= 101). 04/04/19  Yes Emokpae, Courage, MD  allopurinol (ZYLOPRIM) 100 MG tablet Take 1 tablet (100 mg total) by mouth daily. 01/21/21  Yes Loman Brooklyn, FNP  apixaban (ELIQUIS) 5 MG TABS tablet Take 1 tablet (5 mg total) by mouth 2 (two) times daily. 01/08/21  Yes Minus Breeding, MD  atorvastatin (LIPITOR) 20 MG tablet Take 1 tablet (20 mg total) by mouth daily. 12/31/20  Yes Bonnielee Haff, MD  Cholecalciferol (VITAMIN D-3) 5000 UNITS TABS Take 5,000 Units by mouth daily.   Yes [provider]  diclofenac Sodium (VOLTAREN) 1 % GEL APPLY 4 GRAMS TOPICALLY 4  TIMES DAILY Patient taking differently: Apply 2 g topically daily as needed (knees). 01/26/21  Yes Loman Brooklyn, FNP  diltiazem (CARTIA XT) 240 MG 24 hr capsule Take 1 capsule (240 mg total) by mouth daily. 12/30/20 03/30/21 Yes Bonnielee Haff, MD  EUTHYROX 50 MCG tablet TAKE 1 TABLET BY MOUTH ONCE DAILY BEFORE BREAKFAST Patient taking differently: Take 50 mcg by mouth daily before breakfast. 09/14/20  Yes Claretta Fraise, MD  ferrous sulfate 325 (65 FE) MG tablet Take 325 mg by mouth daily with breakfast.   Yes [provider]  furosemide (LASIX) 20 MG tablet TAKE 1 TABLET BY MOUTH  TWICE DAILY Patient taking differently: Take 20 mg by mouth 2 (two) times daily. 10/22/20  Yes Loman Brooklyn, FNP  loratadine (CLARITIN) 10 MG tablet Take 10 mg by mouth daily.   Yes [provider]  ONE TOUCH ULTRA TEST test strip USE UP TO FOUR TIMES DAILY AS DIRECTED Patient taking differently: 1 each by Other route in the morning, at noon, in the evening, and at bedtime. 10/04/17  Yes Terald Sleeper, PA-C  oxybutynin (DITROPAN-XL) 10 MG 24 hr tablet Take 1 tablet (10 mg total) by mouth at bedtime. 11/23/20  Yes Loman Brooklyn, FNP  pantoprazole (PROTONIX) 40 MG tablet Take 1 tablet (40 mg total) by mouth 2 (two) times daily before a meal. 11/16/20  Yes Loman Brooklyn, FNP    Inpatient Medications: Scheduled Meds:   allopurinol  100 mg Oral Daily   apixaban  5 mg Oral BID   atorvastatin  20 mg Oral Daily   cholecalciferol  5,000 Units Oral Daily   [START ON 02/10/2021] furosemide  40 mg Oral Daily   insulin aspart  0-15 Units Subcutaneous TID WC   insulin aspart  0-5 Units Subcutaneous QHS   levothyroxine  50 mcg Oral Q0600   metoprolol tartrate  12.5 mg Oral BID   oxybutynin  10 mg Oral QHS   pantoprazole  40 mg Oral BID AC   potassium chloride  40 mEq Oral BID   Continuous Infusions:  PRN Meds: acetaminophen, ondansetron (ZOFRAN) IV  Allergies:   No Known Allergies  Social History:   Social History   Socioeconomic History   Marital status: Widowed    Spouse name: Not on file   Number  of children: 4   Years of education: 12   Highest education level: High school graduate  Occupational History   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 She has four adult children and one son lives with her.    Social Determinants of Health   Financial Resource Strain: Low Risk    Difficulty of Paying Living Expenses: Not hard at all  Food Insecurity: No Food Insecurity   Worried About Charity fundraiser in the Last Year: Never true   [place] in the Last Year: Never true  Transportation Needs: No Transportation Needs   Lack of Transportation (Medical): No   Lack of Transportation (Non-Medical): No  Physical Activity: Insufficiently Active   Days of Exercise per Week: 7 days   Minutes of Exercise per Session: 20 min  Stress: No Stress Concern Present   Feeling of Stress : Not at all  Social Connections: Moderately Integrated    Frequency of Communication with Friends and Family: More than three times a week   Frequency of Social Gatherings with Friends and Family: More than three times a week   Attends Religious Services: More than 4 times per year   Active Member of [employer] or Organizations: Yes   Attends Archivist Meetings: More than 4 times per year   Marital Status: Widowed  Human resources officer Violence: Not At Risk   Fear of Current or Ex-Partner: No   Emotionally Abused: No   Physically Abused: No   Sexually Abused: No    Family History:    Family History  Problem Relation Age of Onset   Colitis Sister 7       alive   Cancer Sister 21       colon   Cancer Mother 63       uterine   Heart disease Father 29       heart failure   Heart attack Brother 24   Healthy Daughter    Healthy Son    Pulmonary embolism Brother 5   Heart disease Brother    Healthy Son    Healthy Son      ROS:  Constitutional: Denied fever, chills, malaise, night sweats Eyes: Denied vision change or loss Ears/Nose/Mouth/Throat: Denied ear ache, sore throat, coughing, sinus pain Cardiovascular: see HPI Respiratory: see HPI  Gastrointestinal: Denied nausea, vomiting, abdominal pain, diarrhea Genital/Urinary: Denied dysuria, hematuria, urinary frequency/urgency Musculoskeletal: Denied muscle ache, joint pain, weakness Skin: Denied rash, wound Neuro: Denied headache, dizziness, syncope Psych: Denied history of depression/anxiety  Endocrine: history of diabetes    Physical Exam/Data:   Vitals:   02/08/21 2029 02/09/21 0223 02/09/21 0500 02/09/21 0524  BP: 126/77 (!) 147/95  128/86  Pulse: 87 86  75  Resp: 18 18  18   Temp: 97.7 F (36.5 C) (!) 97.5 F (36.4 C)  97.8 F (36.6 C)  TempSrc: Oral Oral  Oral  SpO2: 100% 100%  99%  Weight:   93.2 kg   Height:        Intake/Output Summary (Last 24  hours) at 02/09/2021 0944 Last data filed at 02/09/2021 0921 Gross per 24 hour  Intake 360 ml  Output 1050 ml   Net -690 ml   Last 3 Weights 02/09/2021 02/08/2021 02/07/2021  Weight (lbs) 205 lb 7.5 oz 208 lb 1.8 oz 208 lb 8.9 oz  Weight (kg) 93.2 kg 94.4 kg 94.6 kg     Body mass index is 36.4 kg/m.   Vitals:  Vitals:   02/09/21 0223 02/09/21 0524  BP: (!) 147/95 128/86  Pulse: 86 75  Resp: 18 18  Temp: (!) 97.5 F (36.4 C) 97.8 F (36.6 C)  SpO2: 100% 99%   General Appearance: In no apparent distress, sitting in chair  HEENT: Normocephalic, atraumatic. EOMs intact.  Neck: Supple, trachea midline, no JVDs Cardiovascular: Regular rate and rhythm, normal S1-S2,  no significant murmur noted;although body habitus is limiting apex ascultation Respiratory: Resting breathing unlabored, lungs sounds clear to auscultation bilaterally, no use of accessory muscles. On 2LNC,  No wheezes, rales or rhonchi.   Gastrointestinal: Bowel sounds positive, abdomen soft, non-tender, non-distended. No mass or organomegaly.  Extremities: Able to move all extremities in bed without difficulty, BLE edema resolved  Genitourinary:  genital exam not performed Musculoskeletal: Normal muscle bulk and tone, muscle strength 5/5 throughout Skin: Intact, warm, dry. No rashes or petechiae noted in exposed areas.  Neurologic: Alert, oriented to person, place and time. Fluent speech, no cognitive deficit, no gross focal neuro deficit Psychiatric: Normal affect. Mood is appropriate.     EKG:  The EKG was personally reviewed and demonstrates:    Initial EKG on 02/06/2021 and 2123 revealed A. fib with RVR at 145 bpm.    Subsequent EKG on 02/06/2021 at 2335 revealed conversion to sinus rhythm with ventricular rate of 96, occasional PVC.    Telemetry:  Telemetry was personally reviewed and demonstrates:  Sinus rhythm 70s, occasional PVCs   Relevant CV Studies:  Echo from 02/07/21:   1. Left ventricular ejection fraction, by estimation, is 35 to 40%. Left  ventricular ejection fraction by 2D MOD biplane is 37.3 %. The left   ventricle has moderately decreased function. The left ventricle  demonstrates global hypokinesis. Left  ventricular diastolic function could not be evaluated. The average left  ventricular global longitudinal strain is -5.2 %. The global longitudinal  strain is abnormal.   2. Right ventricular systolic function is mildly reduced. The right  ventricular size is mildly enlarged. There is moderately elevated  pulmonary artery systolic pressure. The estimated right ventricular  systolic pressure is 50.2 mmHg.   3. Left atrial size was severely dilated.   4. Right atrial size was moderately dilated.   5. The mitral valve is degenerative. Severe mitral valve regurgitation.  No evidence of mitral stenosis.   6. The tricuspid valve is abnormal. Tricuspid valve regurgitation is  moderate.   7. The aortic valve is tricuspid. There is mild calcification of the  aortic valve. There is mild thickening of the aortic valve. Aortic valve  regurgitation is not visualized. Mild aortic valve sclerosis is present,  with no evidence of aortic valve  stenosis.   8. The inferior vena cava is dilated in size with <50% respiratory  variability, suggesting right atrial pressure of 15 mmHg.   Comparison(s): Changes from prior study are noted. EF is now 35-40%. RVSP  ~51 mmHG. Severe MR is present.    Echo from 04/03/2019:   1. The left ventricle has normal systolic function with an ejection  fraction of  60-65%. The cavity size was normal. There is mildly increased  left ventricular wall thickness. Left ventricular diastolic Doppler  parameters are consistent with impaired  relaxation. Elevated mean left atrial pressure.   2. The right ventricle has normal systolic function. The cavity was  normal. There is no increase in right ventricular wall thickness.   3. Left atrial size was severely dilated.   4. Right atrial size was mildly dilated.   5. No evidence of mitral valve stenosis.   6. The aortic valve  is tricuspid. No stenosis of the aortic valve.   7. The aorta is normal unless otherwise noted.   8. The aortic root is normal in size and structure.   9. Pulmonary hypertension is indeterminant, inadequate TR jet.    Laboratory Data:  High Sensitivity Troponin:  No results for input(s): TROPONINIHS in the last 720 hours.   Chemistry Recent Labs  Lab 02/07/21 0528 02/08/21 0400 02/09/21 0419  NA 137 139 137  K 3.7 3.2* 4.8  CL 104 103 105  CO2 23 25 22   GLUCOSE 143* 108* 134*  BUN 32* 28* 37*  CREATININE 1.47* 1.26* 1.52*  CALCIUM 10.2 10.0 10.4*  GFRNONAA 36* 43* 35*  ANIONGAP 10 11 10     No results for input(s): PROT, ALBUMIN, AST, ALT, ALKPHOS, BILITOT in the last 168 hours. Hematology Recent Labs  Lab 02/07/21 0528 02/08/21 0400 02/09/21 0419  WBC 6.6 6.4 5.5  RBC 2.86* 2.69* 2.88*  HGB 8.0* 7.7* 8.1*  HCT 27.2* 25.6* 28.1*  MCV 95.1 95.2 97.6  MCH 28.0 28.6 28.1  MCHC 29.4* 30.1 28.8*  RDW 15.1 15.4 15.3  PLT 276 249 249   BNPNo results for input(s): BNP, PROBNP in the last 168 hours.  DDimer No results for input(s): DDIMER in the last 168 hours.   Radiology/Studies:  DG Chest 2 View  Result Date: 02/06/2021 CLINICAL DATA:  Shortness of breath and tachycardia. EXAM: CHEST - 2 VIEW COMPARISON:  December 30, 2020 FINDINGS: Enlarged cardiac silhouette. Small bilateral pleural effusions. Interstitial pulmonary edema. Calcific atherosclerotic disease and tortuosity of the aorta. Osseous structures are without acute abnormality. Soft tissues are grossly normal. IMPRESSION: Enlarged cardiac silhouette with interstitial pulmonary edema and small bilateral pleural effusions. Electronically Signed   By: Fidela Salisbury M.D.   On: 02/06/2021 22:00   ECHOCARDIOGRAM COMPLETE  Result Date: 02/07/2021    ECHOCARDIOGRAM REPORT   Patient Name:   [name] [name] Date of Exam: 02/07/2021 Medical Rec #:  [id number]           Height:       63.0 in Accession #:    [id number]           Weight:       208.6 lb Date of Birth:  1940/08/04           BSA:          1.968 m Patient Age:    50 years            BP:           123/81 mmHg Patient Gender: F                   HR:           94 bpm. Exam Location:  Inpatient Procedure: 2D Echo, 3D Echo, Cardiac Doppler, Color Doppler and Strain Analysis Indications:    CHF-Acute Diastolic D22.02  History:        Patient has  prior history of Echocardiogram examinations, most                 recent 04/03/2019. Arrythmias:Atrial Fibrillation. History of                 pulmonary embolism and DVT. GERD. Hypothyroidism. Symptomatic                 anemia.  Sonographer:    Darlina Sicilian RDCS Referring Phys: [place]  1. Left ventricular ejection fraction, by estimation, is 35 to 40%. Left ventricular ejection fraction by 2D MOD biplane is 37.3 %. The left ventricle has moderately decreased function. The left ventricle demonstrates global hypokinesis. Left ventricular diastolic function could not be evaluated. The average left ventricular global longitudinal strain is -5.2 %. The global longitudinal strain is abnormal.  2. Right ventricular systolic function is mildly reduced. The right ventricular size is mildly enlarged. There is moderately elevated pulmonary artery systolic pressure. The estimated right ventricular systolic pressure is 42.6 mmHg.  3. Left atrial size was severely dilated.  4. Right atrial size was moderately dilated.  5. The mitral valve is degenerative. Severe mitral valve regurgitation. No evidence of mitral stenosis.  6. The tricuspid valve is abnormal. Tricuspid valve regurgitation is moderate.  7. The aortic valve is tricuspid. There is mild calcification of the aortic valve. There is mild thickening of the aortic valve. Aortic valve regurgitation is not visualized. Mild aortic valve sclerosis is present, with no evidence of aortic valve stenosis.  8. The inferior vena cava is dilated in size with <50% respiratory  variability, suggesting right atrial pressure of 15 mmHg. Comparison(s): Changes from prior study are noted. EF is now 35-40%. RVSP ~51 mmHG. Severe MR is present. FINDINGS  Left Ventricle: Left ventricular ejection fraction, by estimation, is 35 to 40%. Left ventricular ejection fraction by 2D MOD biplane is 37.3 %. The left ventricle has moderately decreased function. The left ventricle demonstrates global hypokinesis. The average left ventricular global longitudinal strain is -5.2 %. The global longitudinal strain is abnormal. The left ventricular internal cavity size was normal in size. There is no left ventricular hypertrophy. Left ventricular diastolic function could not be evaluated due to mitral regurgitation (moderate or greater). Left ventricular diastolic function could not be evaluated. Right Ventricle: The right ventricular size is mildly enlarged. No increase in right ventricular wall thickness. Right ventricular systolic function is mildly reduced. There is moderately elevated pulmonary artery systolic pressure. The tricuspid regurgitant velocity is 3.02 m/s, and with an assumed right atrial pressure of 15 mmHg, the estimated right ventricular systolic pressure is 83.4 mmHg. Left Atrium: Left atrial size was severely dilated. Right Atrium: Right atrial size was moderately dilated. Pericardium: Trivial pericardial effusion is present. Mitral Valve: The mitral valve is degenerative in appearance. Severe mitral valve regurgitation. No evidence of mitral valve stenosis. MV peak gradient, 5.9 mmHg. The mean mitral valve gradient is 3.0 mmHg. Tricuspid Valve: The tricuspid valve is abnormal. Tricuspid valve regurgitation is moderate . No evidence of tricuspid stenosis. Aortic Valve: The aortic valve is tricuspid. There is mild calcification of the aortic valve. There is mild thickening of the aortic valve. Aortic valve regurgitation is not visualized. Mild aortic valve sclerosis is present, with no evidence  of aortic valve stenosis. Pulmonic Valve: The pulmonic valve was grossly normal. Pulmonic valve regurgitation is mild. No evidence of pulmonic stenosis. Aorta: The aortic root and ascending aorta are structurally normal, with no evidence of dilitation.  Venous: The inferior vena cava is dilated in size with less than 50% respiratory variability, suggesting right atrial pressure of 15 mmHg. IAS/Shunts: The atrial septum is grossly normal.  LEFT VENTRICLE PLAX 2D                        Biplane EF (MOD) LVIDd:         5.50 cm         LV Biplane EF:   Left LVIDs:         4.80 cm                          ventricular LV PW:         1.10 cm                          ejection LV IVS:        1.30 cm                          fraction by LVOT diam:     2.20 cm                          2D MOD LV SV:         43                               biplane is LV SV Index:   22                               37.3 %. LVOT Area:     3.80 cm                                Diastology                                LV e' medial:    3.98 cm/s LV Volumes (MOD)               LV E/e' medial:  26.9 LV vol d, MOD    148.0 ml      LV e' lateral:   7.74 cm/s A2C:                           LV E/e' lateral: 13.9 LV vol d, MOD    167.0 ml A4C:                           2D LV vol s, MOD    89.3 ml       Longitudinal A2C:                           Strain LV vol s, MOD    109.0 ml      2D Strain GLS  -5.2 % A4C:                           Avg: LV SV MOD A2C:  58.7 ml LV SV MOD A4C:   167.0 ml LV SV MOD BP:    58.8 ml RIGHT VENTRICLE RV S prime:     10.00 cm/s TAPSE (M-mode): 1.7 cm LEFT ATRIUM              Index       RIGHT ATRIUM           Index LA diam:        5.10 cm  2.59 cm/m  RA Area:     19.80 cm LA Vol (A2C):   132.0 ml 67.06 ml/m RA Volume:   50.10 ml  25.45 ml/m LA Vol (A4C):   108.0 ml 54.87 ml/m LA Biplane Vol: 122.0 ml 61.98 ml/m  AORTIC VALVE LVOT Vmax:   68.10 cm/s LVOT Vmean:  52.200 cm/s LVOT VTI:    0.112 m  AORTA Ao Root diam: 3.00  cm Ao Asc diam:  3.50 cm MITRAL VALVE                 TRICUSPID VALVE MV Area (PHT): 7.44 cm      TR Peak grad:   36.5 mmHg MV Area VTI:   1.66 cm      TR Vmax:        302.00 cm/s MV Peak grad:  5.9 mmHg MV Mean grad:  3.0 mmHg      SHUNTS MV Vmax:       1.21 m/s      Systemic VTI:  0.11 m MV Vmean:      76.3 cm/s     Systemic Diam: 2.20 cm MV Decel Time: 102 msec MR Peak grad:    111.5 mmHg MR Mean grad:    72.0 mmHg MR Vmax:         528.00 cm/s MR Vmean:        405.0 cm/s MR PISA:         6.28 cm MR PISA Eff ROA: 46 mm MR PISA Radius:  1.00 cm MV E velocity: 107.33 cm/s Eleonore Chiquito MD Electronically signed by Eleonore Chiquito MD Signature Date/Time: 02/07/2021/1:55:16 PM    Final      Assessment and Plan:   Acute systolic heart failure -Presented with peripheral edema, shortness of breath, A. fib RVR -CXR with interstitial pulmonary edema -no BNP at admission - Echo 02/07/21 showed  revealed EF of 35 to 40%, LV global hypokinesis, average LV global longitudinal strain is -5.4%, RV systolic function mildly reduced, RV size mildly enlarged, moderate elevated PASP with estimated RVSP 51.5 mmHg, severely dilated LA, moderate dilated RA, severe MR moderate TR, mild aortic sclerosis. (EF reduced from 60 to 65%, severe MR and moderate TR, RVSP 51 are new 04/03/2019) - UOP 750 ml with Net -1L since admission per I&O , discussed with RN today, UOP not accurately documented due to leakage and incontinence episodes  - weight is down from 208>205 since admission -currently on IV Lasix 40 mg q12h, clinically euvolemic, renal index rising, will transition to PO Lasix 40mg  daily tomorrow  -etiology suspect tachycardia induced CM + underlying valvular disease; may repeat Echo in a month and if not improvement, consider outpatient ischemic evaluation with Myoview  - GDMT:continue metoprolol 12.5mg  BID, transition to 25mg  XL at DC; add ARNI/MRA/SGLT2i when renal function recovers, agree with stopping Cardizem  - Fluid  restriction, Low Na diet, CHF signs dicussed with the patient at bedside   Persistent A. fib with RVR - TSH and Mag WNL, worsening anemia noted  at admission - s/p cardioversion at ED with conversion to SR - rate controlled, continue metoprolol  - CHA2DS2-VASc Score = 6 , continue anticoagulation with Eliquis if there is no contraindication/bleeding   Severe MR Moderate TR - avoid bradycardia,  will refer to structural heart team for outpatient evaluation once euvolemic   Acute on chronic anemia - Hgb 8.5 POA, was in 10-11 ranges previously this year - noted previous GI workup for IDA in the past (see notes) - consider GI evaluation given worsening anemia and need of anticoagulation   AKI - Cr baseline around 1, 1.43 POA, 1.52 today - clinically euvolemic today,  will transition IV lasix to PO  - avoid nephrotoxin , trend daily BMP   Hypertension - BP is controlled on metoprolol and lasix at this time   Hyperlipidemia - on statin   History of unprovoked PE/DVT Type 2 DM  Hypothyroidism  Gout  - managed per IM    Risk Assessment/Risk Scores:      New York Heart Association (NYHA) Functional Class NYHA Class III  CHA2DS2-VASc Score = 6  This indicates a 9.7% annual risk of stroke. The patient's score is based upon: CHF History: Yes HTN History: Yes Diabetes History: Yes Stroke History: No Vascular Disease History: No Age Score: 2 Gender Score: 1       For questions or updates, please contact [place] Please consult [email or web address] for contact info under    Signed, Margie Billet, NP  02/09/2021 9:44 AM

## 2021-02-09 NOTE — Telephone Encounter (Signed)
I have received lab work from [facility] urology completed on 01/26/2021 at which time the patient's intact PTH was 158 and her calcium was 10.2.  Please call patient and let her know I will put in an order to recheck her parathyroid hormone.  She can just come in to have this done and does not need to see me.  If it remains elevated I would like to refer her to an endocrinologist.  Does she have a preference where to go?

## 2021-02-09 NOTE — Telephone Encounter (Signed)
Patient aware and verbalizes understanding. States that does not have a place in mind.  She is currently in the hospital and will follow up with [place] when she is out of the hospital.

## 2021-02-09 NOTE — Progress Notes (Signed)
  Pt able to ambulate to bathroom and then to nurses station with walker x1 assist.   Pt O2 saturation on 2L [place] at rest 99%  Pt O2 saturation on room air at rest 97%  Pt O2 saturation on room air with exertion 81%  Pt O2 saturation on 2L [place] with exertion 97%   Pt safety maintained. Will continue to monitor and reassess.

## 2021-02-10 ENCOUNTER — Inpatient Hospital Stay (HOSPITAL_COMMUNITY): Payer: Medicare Other

## 2021-02-10 DIAGNOSIS — J810 Acute pulmonary edema: Secondary | ICD-10-CM

## 2021-02-10 LAB — BASIC METABOLIC PANEL
Anion gap: 6 (ref 5–15)
BUN: 44 mg/dL — ABNORMAL HIGH (ref 8–23)
CO2: 27 mmol/L (ref 22–32)
Calcium: 10.9 mg/dL — ABNORMAL HIGH (ref 8.9–10.3)
Chloride: 106 mmol/L (ref 98–111)
Creatinine, Ser: 1.43 mg/dL — ABNORMAL HIGH (ref 0.44–1.00)
GFR, Estimated: 37 mL/min — ABNORMAL LOW (ref >60)
Glucose, Bld: 121 mg/dL — ABNORMAL HIGH (ref 70–99)
Potassium: 5.5 mmol/L — ABNORMAL HIGH (ref 3.5–5.1)
Sodium: 139 mmol/L (ref 135–145)

## 2021-02-10 LAB — GLUCOSE, CAPILLARY
Glucose-Capillary: 112 mg/dL — ABNORMAL HIGH (ref 70–99)
Glucose-Capillary: 100 mg/dL — ABNORMAL HIGH (ref 70–99)

## 2021-02-10 LAB — CBC
HCT: 29.3 % — ABNORMAL LOW (ref 36.0–46.0)
Hemoglobin: 8.5 g/dL — ABNORMAL LOW (ref 12.0–15.0)
MCH: 27.8 pg (ref 26.0–34.0)
MCHC: 29 g/dL — ABNORMAL LOW (ref 30.0–36.0)
MCV: 95.8 fL (ref 80.0–100.0)
Platelets: 279 10*3/uL (ref 150–400)
RBC: 3.06 MIL/uL — ABNORMAL LOW (ref 3.87–5.11)
RDW: 15.1 % (ref 11.5–15.5)
WBC: 6.3 10*3/uL (ref 4.0–10.5)
nRBC: 0 % (ref 0.0–0.2)

## 2021-02-10 MED ORDER — METOPROLOL SUCCINATE ER 25 MG PO TB24: 25.0000 mg | ORAL_TABLET | Freq: Every day | ORAL | 1 refills | Status: DC

## 2021-02-10 MED ORDER — ORAL CARE MOUTH RINSE: 15.0000 mL | Freq: Two times a day (BID) | OROMUCOSAL | Status: DC

## 2021-02-10 MED ORDER — DAPAGLIFLOZIN PROPANEDIOL 10 MG PO TABS
10.0000 mg | ORAL_TABLET | Freq: Every day | ORAL | Status: DC
  Filled 2021-02-10: qty 1

## 2021-02-10 MED ORDER — FUROSEMIDE 40 MG PO TABS: 40.0000 mg | ORAL_TABLET | Freq: Every day | ORAL | 1 refills | Status: DC

## 2021-02-10 MED ORDER — METOPROLOL SUCCINATE ER 25 MG PO TB24: 25.0000 mg | ORAL_TABLET | Freq: Every day | ORAL | Status: DC

## 2021-02-10 NOTE — Discharge Summary (Signed)
DISCHARGE SUMMARY  [name] [name]  MR#: [id number]  DOB:16-Mar-1941  Date of Admission: 02/06/2021 Date of Discharge: 02/10/2021  Attending Physician:Maddox Hlavaty Hennie Duos, MD  Patient's IPJ:ASNKN, [name] Quan, FNP  Consults: [facility]  Disposition: D/C home   Follow-up Appts:  Follow-up Information     Lendon Colonel, NP Follow up on 03/05/2021.   Specialties: Nurse Practitioner, Radiology, Cardiology Why: please arrvie before 2:15 PM for your post-hospital follow up appointment with cardiology Contact information: [address] [place] [address] [phone]         Loman Brooklyn, FNP Follow up in 1 week(s).   Specialty: Family Medicine Contact information: [place] [address] [phone]                 Tests Needing Follow-up: - check K+ and creatinine (pt on increased diuretic dose) - f/u of O2 sats on exertion to determine if O2 can be weaned off  Discharge Diagnoses: Chronic atrial fibrillation with acute RVR Acute newly diagnosed severe systolic CHF Acute hypoxic resp failure  Pulmonary edema Newly diagnosed Mitral and Tricuspid valve regurgitation  Acute kidney injury Hypokalemia Chronic Normocytic anemia Nephrolothiasis w/ hx of Urosepsis HTN DM2 Hypothyroidism History of DVT/PE GERD Morbid obesity - Body mass index is 35.85 kg/m.  Initial presentation: 80yo with a history of chronic atrial fibrillation on Eliquis, DM2, HTN, hypothyroidism, morbid obesity, DVT and pulmonary embolism, osteoarthritis, and GERD who presented to the ER with palpitations and shortness of breath.  This was associated with gradually worsening pedal edema and orthopnea.  In the ER she was found to have heart rates ranging between 120-160.  Exam and CXR were consistent with pulmonary edema.  Hospital Course:  Chronic atrial fibrillation with acute RVR cardioverted in the ED first with 120 J biphasic then 200 J  ultimately resulting in conversion to normal sinus rhythm - remains in sinus rhythm at time of d/c -TSH within normal range - magnesium 1.8   Acute newly diagnosed severe systolic CHF -pulmonary edema Likely rate related due to afib RVR - EF normal on TTE Sept 2020 - f/u TTE this admit notes severe systolic failure w/ global LV hypokinesis - continue diuresis w/ change back to oral lasix as crt was climbing - watched renal function - Is/Os have been inaccurate - Cards has seen in consultation (followed by Dr. Percival Spanish) - f/u arranged as oupt w/ Cards   Acute hypoxic resp failure  Due to pulmonary edema - even after diuresis she continued to require O2 support when ambulating to keep her sats >87% - home O2 arragned for use at 2L - f/u of sats on exertion in f/u visit to determine if O2 can be weaned off  Newly diagnosed Mitral and Tricuspid valve regurgitation  For f/u w/ Cards as outpt    Acute kidney injury Creatinine 1.43 at presentation with baseline creatinine 0.97 - initially improved w/ diuresis, then trended back up w/ diuresis - now appears stable - no ACEi for now   Hypokalemia Due to poor intake and diuresis - corrected   Chronic Normocytic anemia Hemoglobin 8.5 at presentation - appears hemoglobin has slowly been trending down over the last month - pt is on oral Fe tx already - Hgb holding steady for now   Nephrolothiasis w/ hx of Urosepsis s/p cystoscopy/ureteroscopy w/ laser litho and basket extraction stones & R ureteral stent exchange 01/15/21 (Dr. Abner Greenspan) - no clinical evidence of current complications   HTN  Blood pressure controlled   DM2 CBG well controlled   Hypothyroidism Appears reasonably well managed with Synthroid - would not push TSH down further in setting of Afib    History of DVT/PE Continue Eliquis    GERD   Morbid obesity - Body mass index is 35.85 kg/m.    Allergies as of 02/10/2021   No Known Allergies      Medication List     STOP taking  these medications    diltiazem 240 MG 24 hr capsule Commonly known as: Cartia XT       TAKE these medications    acetaminophen 325 MG tablet Commonly known as: TYLENOL Take 2 tablets (650 mg total) by mouth every 6 (six) hours as needed for mild pain or headache (or Fever >/= 101).   allopurinol 100 MG tablet Commonly known as: ZYLOPRIM Take 1 tablet (100 mg total) by mouth daily.   apixaban 5 MG Tabs tablet Commonly known as: ELIQUIS Take 1 tablet (5 mg total) by mouth 2 (two) times daily.   atorvastatin 20 MG tablet Commonly known as: LIPITOR Take 1 tablet (20 mg total) by mouth daily.   diclofenac Sodium 1 % Gel Commonly known as: VOLTAREN APPLY 4 GRAMS TOPICALLY 4  TIMES DAILY What changed: See the new instructions.   Euthyrox 50 MCG tablet Generic drug: levothyroxine TAKE 1 TABLET BY MOUTH ONCE DAILY BEFORE BREAKFAST What changed: how much to take   ferrous sulfate 325 (65 FE) MG tablet Take 325 mg by mouth daily with breakfast.   furosemide 40 MG tablet Commonly known as: LASIX Take 1 tablet (40 mg total) by mouth daily. Start taking on: February 11, 2021 What changed:  medication strength how much to take when to take this   loratadine 10 MG tablet Commonly known as: CLARITIN Take 10 mg by mouth daily.   metoprolol succinate 25 MG 24 hr tablet Commonly known as: TOPROL-XL Take 1 tablet (25 mg total) by mouth daily. Start taking on: February 11, 2021   ONE TOUCH ULTRA TEST test strip Generic drug: glucose blood USE UP TO FOUR TIMES DAILY AS DIRECTED What changed: See the new instructions.   oxybutynin 10 MG 24 hr tablet Commonly known as: DITROPAN-XL Take 1 tablet (10 mg total) by mouth at bedtime.   pantoprazole 40 MG tablet Commonly known as: PROTONIX Take 1 tablet (40 mg total) by mouth 2 (two) times daily before a meal.   Vitamin D-3 125 MCG (5000 UT) Tabs Take 5,000 Units by mouth daily.               Durable Medical Equipment   (From admission, onward)           Start     Ordered   02/10/21 0802  For home use only DME oxygen  Once       Comments: Pt O2 saturation on room air with exertion 81% Pt O2 saturation on 2L [place] with exertion 97%  Question Answer Comment  Length of Need Lifetime   Mode or (Route) Nasal cannula   Liters per Minute 2   Frequency Continuous (stationary and portable oxygen unit needed)   Oxygen conserving device Yes   Oxygen delivery system Gas      02/10/21 0802            Day of Discharge BP 122/75 (BP Location: Left Arm)   Pulse 75   Temp 98.4 F (36.9 C) (Oral)   Resp 20   Ht  5\' 3"  (1.6 m)   Wt 91.8 kg   SpO2 100%   BMI 35.85 kg/m   Physical Exam: General: No acute respiratory distress Lungs: Clear to auscultation bilaterally except for mild bibasilar crackles with no wheezing Cardiovascular: Regular rate and rhythm without murmur gallop or rub normal S1 and S2 Abdomen: Nontender, nondistended, soft, bowel sounds positive, no rebound, no ascites, no appreciable mass Extremities: 2+ persistent B LE edema   Basic Metabolic Panel: Recent Labs  Lab 02/06/21 2132 02/07/21 0528 02/08/21 0400 02/09/21 0419 02/10/21 0410  NA 135 137 139 137 139  K 3.8 3.7 3.2* 4.8 5.5*  CL 104 104 103 105 106  CO2 22 23 25 22 27   GLUCOSE 149* 143* 108* 134* 121*  BUN 32* 32* 28* 37* 44*  CREATININE 1.43* 1.47* 1.26* 1.52* 1.43*  CALCIUM 10.7* 10.2 10.0 10.4* 10.9*  MG  --   --  1.8 1.8  --     Coags: Recent Labs  Lab 02/06/21 2132  INR 1.5*    CBC: Recent Labs  Lab 02/06/21 2132 02/07/21 0528 02/08/21 0400 02/09/21 0419 02/10/21 0410  WBC 7.0 6.6 6.4 5.5 6.3  HGB 8.5* 8.0* 7.7* 8.1* 8.5*  HCT 27.9* 27.2* 25.6* 28.1* 29.3*  MCV 93.3 95.1 95.2 97.6 95.8  PLT 299 276 249 249 279     CBG: Recent Labs  Lab 02/09/21 1126 02/09/21 1612 02/09/21 2112 02/10/21 0728 02/10/21 1106  GLUCAP 120* 121* 106* 112* 100*    Recent Results (from the past 240  hour(s))  Resp Panel by RT-PCR (Flu A&B, Covid) Nasopharyngeal Swab     Status: None   Collection Time: 02/07/21  3:04 AM   Specimen: Nasopharyngeal Swab; Nasopharyngeal(NP) swabs in vial transport medium  Result Value Ref Range Status   SARS Coronavirus 2 by RT PCR NEGATIVE NEGATIVE Final    Comment: (NOTE) SARS-CoV-2 target nucleic acids are NOT DETECTED.  The SARS-CoV-2 RNA is generally detectable in upper respiratory specimens during the acute phase of infection. The lowest concentration of SARS-CoV-2 viral copies this assay can detect is 138 copies/mL. A negative result does not preclude SARS-Cov-2 infection and should not be used as the sole basis for treatment or other patient management decisions. A negative result may occur with  improper specimen collection/handling, submission of specimen other than nasopharyngeal swab, presence of viral mutation(s) within the areas targeted by this assay, and inadequate number of viral copies(<138 copies/mL). A negative result must be combined with clinical observations, patient history, and epidemiological information. The expected result is Negative.  Fact Sheet for Patients:  EntrepreneurPulse.com.au  Fact Sheet for Healthcare Providers:  IncredibleEmployment.be  This test is no t yet approved or cleared by the [place] FDA and  has been authorized for detection and/or diagnosis of SARS-CoV-2 by FDA under an Emergency Use Authorization (EUA). This EUA will remain  in effect (meaning this test can be used) for the duration of the COVID-19 declaration under Section 564(b)(1) of the Act, 21 U.S.C.section 360bbb-3(b)(1), unless the authorization is terminated  or revoked sooner.       Influenza A by PCR NEGATIVE NEGATIVE Final   Influenza B by PCR NEGATIVE NEGATIVE Final    Comment: (NOTE) The Xpert Xpress SARS-CoV-2/FLU/RSV plus assay is intended as an aid in the diagnosis of influenza from  Nasopharyngeal swab specimens and should not be used as a sole basis for treatment. Nasal washings and aspirates are unacceptable for Xpert Xpress SARS-CoV-2/FLU/RSV testing.  Fact Sheet for Patients:  EntrepreneurPulse.com.au  Fact Sheet for Healthcare Providers: IncredibleEmployment.be  This test is not yet approved or cleared by the [place] FDA and has been authorized for detection and/or diagnosis of SARS-CoV-2 by FDA under an Emergency Use Authorization (EUA). This EUA will remain in effect (meaning this test can be used) for the duration of the COVID-19 declaration under Section 564(b)(1) of the Act, 21 U.S.C. section 360bbb-3(b)(1), unless the authorization is terminated or revoked.  Performed at [facility], [place] [address], [place], [place] [address]       Time spent in discharge (includes decision making & examination of pt): 35 minutes  02/10/2021, 12:11 PM   Cherene Altes, MD [facility] Office  [phone]

## 2021-02-10 NOTE — Plan of Care (Signed)
  Problem: Education: Goal: Ability to demonstrate management of disease process will improve Outcome: Completed/Met Goal: Ability to verbalize understanding of medication therapies will improve Outcome: Completed/Met Goal: Individualized Educational Video(s) Outcome: Completed/Met   Problem: Activity: Goal: Capacity to carry out activities will improve Outcome: Completed/Met   Problem: Cardiac: Goal: Ability to achieve and maintain adequate cardiopulmonary perfusion will improve Outcome: Completed/Met   Problem: Education: Goal: Knowledge of General Education information will improve Description: Including pain rating scale, medication(s)/side effects and non-pharmacologic comfort measures Outcome: Completed/Met   Problem: Activity: Goal: Risk for activity intolerance will decrease Outcome: Completed/Met   Problem: Nutrition: Goal: Adequate nutrition will be maintained Outcome: Completed/Met   Problem: Elimination: Goal: Will not experience complications related to bowel motility Outcome: Completed/Met Goal: Will not experience complications related to urinary retention Outcome: Completed/Met   Problem: Pain Managment: Goal: General experience of comfort will improve Outcome: Completed/Met   Problem: Safety: Goal: Ability to remain free from injury will improve Outcome: Completed/Met   Problem: Skin Integrity: Goal: Risk for impaired skin integrity will decrease Outcome: Completed/Met

## 2021-02-10 NOTE — Progress Notes (Addendum)
Progress Note  Patient Name: [name] [name] Date of Encounter: 02/10/2021  [facility] Cardiologist: Minus Breeding, MD   Subjective   Patient is sitting in chair, eating breakfast. She states she is feeling well. She denied any chest pain or SOB. She states she had difficulty time last hospitalization with weaning off oxygen as well. She is urinating well, aware to limit fluid intake.   Inpatient Medications    Scheduled Meds:  allopurinol  100 mg Oral Daily   apixaban  5 mg Oral BID   atorvastatin  20 mg Oral Daily   cholecalciferol  5,000 Units Oral Daily   furosemide  40 mg Oral Daily   insulin aspart  0-15 Units Subcutaneous TID WC   insulin aspart  0-5 Units Subcutaneous QHS   levothyroxine  50 mcg Oral Q0600   mouth rinse  15 mL Mouth Rinse BID   metoprolol tartrate  12.5 mg Oral BID   oxybutynin  10 mg Oral QHS   pantoprazole  40 mg Oral BID AC   Continuous Infusions:  PRN Meds: acetaminophen, ondansetron (ZOFRAN) IV   Vital Signs    Vitals:   02/09/21 1234 02/09/21 2034 02/10/21 0421 02/10/21 0435  BP: 124/78 134/75 122/75   Pulse: 73 83 75   Resp: 16 16 20    Temp: 98.1 F (36.7 C) 98.4 F (36.9 C) 98.4 F (36.9 C)   TempSrc: Oral Oral Oral   SpO2: 100% 100% 100%   Weight:    91.8 kg  Height:    5\' 3"  (1.6 m)    Intake/Output Summary (Last 24 hours) at 02/10/2021 0829 Last data filed at 02/10/2021 0600 Gross per 24 hour  Intake 1260 ml  Output 700 ml  Net 560 ml   Last 3 Weights 02/10/2021 02/09/2021 02/08/2021  Weight (lbs) 202 lb 6.4 oz 205 lb 7.5 oz 208 lb 1.8 oz  Weight (kg) 91.808 kg 93.2 kg 94.4 kg      Telemetry    Sinus rhythm with occasional PVCs, rate of 80s this AM, noted early AM sinus bradycardiac 40-50s, NSVT x3 runs and short runs of AT at 140s over the past 24 hours  - Personally Reviewed  ECG    N/A - Personally Reviewed  Physical Exam   GEN: No acute distress.   Neck: No JVD Cardiac: RRR, no murmurs, rubs, or  gallops.  Respiratory: Clear to auscultation bilaterally. On 2LNC. Speaks full sentence.  GI: Soft, nontender, non-distended  MS: No BLE edema; No deformity. Neuro:  Nonfocal  Psych: Normal affect   Labs    High Sensitivity Troponin:  No results for input(s): TROPONINIHS in the last 720 hours.    Chemistry Recent Labs  Lab 02/08/21 0400 02/09/21 0419 02/10/21 0410  NA 139 137 139  K 3.2* 4.8 5.5*  CL 103 105 106  CO2 25 22 27   GLUCOSE 108* 134* 121*  BUN 28* 37* 44*  CREATININE 1.26* 1.52* 1.43*  CALCIUM 10.0 10.4* 10.9*  GFRNONAA 43* 35* 37*  ANIONGAP 11 10 6      Hematology Recent Labs  Lab 02/08/21 0400 02/09/21 0419 02/10/21 0410  WBC 6.4 5.5 6.3  RBC 2.69* 2.88* 3.06*  HGB 7.7* 8.1* 8.5*  HCT 25.6* 28.1* 29.3*  MCV 95.2 97.6 95.8  MCH 28.6 28.1 27.8  MCHC 30.1 28.8* 29.0*  RDW 15.4 15.3 15.1  PLT 249 249 279    BNPNo results for input(s): BNP, PROBNP in the last 168 hours.   DDimer  No results for input(s): DDIMER in the last 168 hours.   Radiology    No results found.  Cardiac Studies   Echo from 02/07/21:    1. Left ventricular ejection fraction, by estimation, is 35 to 40%. Left  ventricular ejection fraction by 2D MOD biplane is 37.3 %. The left  ventricle has moderately decreased function. The left ventricle  demonstrates global hypokinesis. Left  ventricular diastolic function could not be evaluated. The average left  ventricular global longitudinal strain is -5.2 %. The global longitudinal  strain is abnormal.   2. Right ventricular systolic function is mildly reduced. The right  ventricular size is mildly enlarged. There is moderately elevated  pulmonary artery systolic pressure. The estimated right ventricular  systolic pressure is 01.7 mmHg.   3. Left atrial size was severely dilated.   4. Right atrial size was moderately dilated.   5. The mitral valve is degenerative. Severe mitral valve regurgitation.  No evidence of mitral  stenosis.   6. The tricuspid valve is abnormal. Tricuspid valve regurgitation is  moderate.   7. The aortic valve is tricuspid. There is mild calcification of the  aortic valve. There is mild thickening of the aortic valve. Aortic valve  regurgitation is not visualized. Mild aortic valve sclerosis is present,  with no evidence of aortic valve  stenosis.   8. The inferior vena cava is dilated in size with <50% respiratory  variability, suggesting right atrial pressure of 15 mmHg.   Comparison(s): Changes from prior study are noted. EF is now 35-40%. RVSP  ~51 mmHG. Severe MR is present.     Echo from 04/03/2019:    1. The left ventricle has normal systolic function with an ejection  fraction of 60-65%. The cavity size was normal. There is mildly increased  left ventricular wall thickness. Left ventricular diastolic Doppler  parameters are consistent with impaired  relaxation. Elevated mean left atrial pressure.   2. The right ventricle has normal systolic function. The cavity was  normal. There is no increase in right ventricular wall thickness.   3. Left atrial size was severely dilated.   4. Right atrial size was mildly dilated.   5. No evidence of mitral valve stenosis.   6. The aortic valve is tricuspid. No stenosis of the aortic valve.   7. The aorta is normal unless otherwise noted.   8. The aortic root is normal in size and structure.   9. Pulmonary hypertension is indeterminant, inadequate TR jet.   Patient Profile     [name] [name] is a 80 y.o. female with a hx of  HTN, history of unprovoked PE/DVT on Eliquis, persistent atrial fibrillation, type 2 diabetes, obesity, hypothyroidism, GERD, chronic anemia, , who is being seen 02/09/2021 for the evaluation of acute systolic heart failure at the request of Dr. Thereasa Solo.     Assessment & Plan     Acute systolic heart failure -Presented with peripheral edema, shortness of breath, A. fib RVR -CXR with interstitial  pulmonary edema -no BNP at admission - Echo 02/07/21 showed  revealed EF of 35 to 40%, LV global hypokinesis, average LV global longitudinal strain is -4.9%, RV systolic function mildly reduced, RV size mildly enlarged, moderate elevated PASP with estimated RVSP 51.5 mmHg, severely dilated LA, moderate dilated RA, severe MR moderate TR, mild aortic sclerosis. (EF reduced from 60 to 65%, severe MR and moderate TR, RVSP 51 are new 04/03/2019) - UOP not accurately documented due to leakage and incontinence episodes -  weight is down from 208>202 since admission - clinically euvolemic, renal index rising, transitioned to PO Lasix 40mg  daily now - etiology suspect tachycardia induced CM + underlying valvular disease; may repeat Echo in a month and if not improvement, consider outpatient ischemic evaluation with Myoview - GDMT: continue metoprolol 12.5mg  BID, transition to 25mg  XL at DC; will add ARNI/MRA/SGLT2i when renal function recovers, agree with stopping Cardizem  - Fluid restriction, Low Na diet, CHF signs dicussed with the patient at bedside  - follow up with cardiology arranged on 03/05/21 at 2:15 PM   Hyperkalemia  - due to K 44meq BID supplement which is stopped, may consider 20 MEQ daily prophylaxis if needed with ongoing diuresis    Persistent A. fib with RVR NSVT, brief runs of AT - TSH and Mag WNL, worsening anemia noted at admission - s/p cardioversion at ED with conversion to SR - rate controlled, continue metoprolol - CHA2DS2-VASc Score = 6 , continue anticoagulation with Eliquis if there is no contraindication/bleeding    Severe MR Moderate TR - avoid bradycardia,  will refer to structural heart team for outpatient evaluation once euvolemic   Acute on chronic anemia - Hgb 8.5 POA, was in 10-11 ranges previously this year - noted previous GI workup for IDA in the past (see notes) - consider GI evaluation given worsening anemia and need of anticoagulation    AKI - Cr baseline  around 1, 1.43 POA, transitioned to PO Lasix today, Cr downtrend 1.43 - clinically euvolemic  - avoid nephrotoxin , trend daily BMP    Hypertension - BP is controlled on metoprolol and lasix at this time    Hyperlipidemia - on statin    History of unprovoked PE/DVT Type 2 DM Hypothyroidism  Gout - managed per IM       For questions or updates, please contact [facility] Please consult [email or web address] for contact info under        Signed, Margie Billet, NP  02/10/2021, 8:29 AM

## 2021-02-10 NOTE — TOC Initial Note (Signed)
Transition of Care [facility]) - Initial/Assessment Note    Patient Details  Name: [name] [name] MRN: [id number] Date of Birth: [birth year]  Transition [facility]) CM/SW Contact:    Dessa Phi, RN Phone Number: 02/10/2021, 11:37 AM  Clinical Narrative:Noted qualifies for home 02 per 02 sats-Spoke to son Todd-unsure of home [place] in past-contacted [facility] to confirm active or used , & to arrange home 02 with order-await response. Family to transport home on own.                   Expected Discharge Plan: Home/Self Care Barriers to Discharge: Continued Medical Work up   Patient Goals and CMS Choice        Expected Discharge Plan and Services Expected Discharge Plan: Home/Self Care   Discharge Planning Services: CM Consult   Living arrangements for the past 2 months: Single Family Home                                      Prior Living Arrangements/Services Living arrangements for the past 2 months: Single Family Home Lives with:: Adult Children Patient language and need for interpreter reviewed:: Yes Do you feel safe going back to the place where you live?: Yes      Need for Family Participation in Patient Care: No (Comment) Care giver support system in place?: Yes (comment) Current home services: DME ([employer]ator-unsure of company) Criminal Activity/Legal Involvement Pertinent to Current Situation/Hospitalization: No - Comment as needed  Activities of Daily Living Home Assistive Devices/Equipment: CBG Meter, Blood pressure cuff, Cane (specify quad or straight), Eyeglasses, Hand-held shower hose, Shower chair without back, Grab bars in shower, Dentures (specify type), Walker (specify type) ADL Screening (condition at time of admission) Patient's cognitive ability adequate to safely complete daily activities?: Yes Is the patient deaf or have difficulty hearing?: No Does the patient have difficulty seeing, even when wearing glasses/contacts?:  No Does the patient have difficulty concentrating, remembering, or making decisions?: No Patient able to express need for assistance with ADLs?: Yes Does the patient have difficulty dressing or bathing?: No Independently performs ADLs?: Yes (appropriate for developmental age) Does the patient have difficulty walking or climbing stairs?: Yes Weakness of Legs: Both Weakness of Arms/Hands: None  Permission Sought/Granted Permission sought to share information with : Case Manager Permission granted to share information with : Yes, Verbal Permission Granted  Share Information with NAME: Case manager     Permission granted to share info w Relationship: Sherren Mocha son [phone]     Emotional Assessment Appearance:: Appears stated age Attitude/Demeanor/Rapport: Gracious Affect (typically observed): Accepting Orientation: : Oriented to Self, Oriented to Place, Oriented to  Time, Oriented to Situation Alcohol / Substance Use: Not Applicable Psych Involvement: No (comment)  Admission diagnosis:  Acute pulmonary edema (HCC) [J81.0] Peripheral edema [R60.9] Renal insufficiency [N28.9] Rapid atrial fibrillation (HCC) [I48.91] Atrial fibrillation with RVR (HCC) [I48.91] Patient Active Problem List   Diagnosis Date Noted   Acute combined systolic and diastolic heart failure (HCC)    Nonrheumatic mitral valve regurgitation    Acute CHF (congestive heart failure) ([place]) 02/07/2021   Rapid atrial fibrillation ([place]) 02/07/2021   Hypercalcemia 02/07/2021   A-fib ([place]) 02/04/2021   Educated about COVID-19 virus infection 02/02/2020   Iron deficiency anemia due to chronic blood loss    Occult blood in stools    Unprovoked DVT and Unprovoked Pulmonary  Embolism -Dxed 03/2019 04/03/2019   Sciatica of right side 10/18/2018   Seborrheic keratosis 10/17/2016   Hyperlipidemia associated with type 2 diabetes mellitus ([place]) 10/17/2016   Overactive bladder 10/17/2016   Gastroesophageal reflux disease without  esophagitis 10/17/2016   Primary osteoarthritis involving multiple joints 10/17/2016   Chronic gout without tophus 10/17/2016   Type 2 diabetes mellitus without complication, without long-term current use of insulin ([place]) 05/16/2016   Essential hypertension 05/16/2016   Hypothyroidism 05/16/2016   Anemia 10/30/2013   Morbid obesity ([place]) 10/30/2013   S/P right THA, AA 10/29/2013   PCP:  Loman Brooklyn, FNP Pharmacy:   [facility] [address], [place] - [place] [address] [place] [address] Phone: [phone] Fax: [phone]  [facility]  ([place], [place] [address] Phone: [phone] Fax: [phone]     Social Determinants of Health (SDOH) Interventions    Readmission Risk Interventions No flowsheet data found.

## 2021-02-10 NOTE — Progress Notes (Signed)
Pt discharged home today per Dr. Thereasa Solo. Pt's IV site D/C'd and WDL. Pt's VSS. Pt provided with home medication list, discharge instructions and prescriptions. Verbalized understanding. Pt left floor via WC in stable condition accompanied by NT.

## 2021-02-10 NOTE — TOC Transition Note (Signed)
Transition of Care [facility]) - CM/SW Discharge Note   Patient Details  Name: [name] [name] MRN: [id number] Date of Birth: Apr 30, 1941  [facility]) CM/SW Contact:  [name] Phi, RN Phone Number: 02/10/2021, 12:38 PM   Clinical Narrative: d/c home today w/home 02-[employer] rep [name] [name] aware to deliver home 02 to rm prior d/c. No further CM needs.      Final next level of care: Home/Self Care Barriers to Discharge: No Barriers Identified   Patient Goals and CMS Choice        Discharge Placement                       Discharge Plan and Services   Discharge Planning Services: CM Consult            DME Arranged: Oxygen DME Agency: [employer] Date DME Agency Contacted: 02/10/21 Time DME Agency Contacted: 1238 Representative spoke with at DME Agency: [name] [name] ([place]) Interventions     Readmission Risk Interventions No flowsheet data found.

## 2021-02-11 ENCOUNTER — Telehealth: Payer: Self-pay

## 2021-02-11 DIAGNOSIS — J9601 Acute respiratory failure with hypoxia: Secondary | ICD-10-CM | POA: Diagnosis not present

## 2021-02-11 DIAGNOSIS — I5041 Acute combined systolic (congestive) and diastolic (congestive) heart failure: Secondary | ICD-10-CM | POA: Diagnosis not present

## 2021-02-11 NOTE — Telephone Encounter (Signed)
Transition Care Management Follow-up Telephone Call Date of discharge and from where: [facility] 02/10/21 Diagnosis: chronic a.fib with acute RVR, respiratory failure, CHF How have you been since you were released from the hospital? She had a good night, rested well, seems to be doing much better Any questions or concerns? No  Items Reviewed: Did the pt receive and understand the discharge instructions provided? Yes  Medications obtained and verified? Yes  Other? No  Any new allergies since your discharge? No  Dietary orders reviewed? Yes Do you have support at home? Yes   Home Care and Equipment/Supplies: Were home health services ordered? no If so, what is the name of the agency? N/a  Has the agency set up a time to come to the patient's home? not applicable Were any new equipment or medical supplies ordered?  Yes: Oxygen What is the name of the medical supply agency? [employer] Were you able to get the supplies/equipment? They called yesterday and said they would bring it today - she has enough from hospital to last through today (they had just picked up her oxygen 2 days ago from her last hospital stay) Do you have any questions related to the use of the equipment or supplies? No  Functional Questionnaire: (I = Independent and D = Dependent) ADLs: I  Bathing/Dressing- I  Meal Prep- I  Eating- I  Maintaining continence- I  Transferring/Ambulation- I  Managing Meds- I  Follow up appointments reviewed:  PCP Hospital f/u appt confirmed? Yes  Scheduled to see [name] on 02/24/21 @ 11:05. [facility] f/u appt confirmed? Yes  Scheduled to see cardiology on 8/12 @ 2:15. Are transportation arrangements needed? No  If their condition worsens, is the pt aware to call PCP or go to the Emergency Dept.? Yes Was the patient provided with contact information for the PCP's office or ED? Yes Was to pt encouraged to call back with questions or concerns? Yes

## 2021-02-12 ENCOUNTER — Ambulatory Visit: Payer: Medicare Other | Admitting: Pharmacist

## 2021-02-12 DIAGNOSIS — I4891 Unspecified atrial fibrillation: Secondary | ICD-10-CM

## 2021-02-12 NOTE — Progress Notes (Signed)
Chronic Care Management Pharmacy Note  02/18/2021 Name:  [name] [name] MRN:  [id number] DOB:  1941/05/22  Summary: MEDICATION MANAGEMENT/ASSISTANCE  Recommendations/Changes made from today's visit: Atrial Fibrillation: Controlled; current rate/rhythm control:METOP; anticoagulant treatment: ELIQUIS PATIENT DENIES SIGNS & SYMPTOMS OF BLEEDING--TOLERATING WELL UNABLE TO AFFORD HIGH COPAY OF ELIQUIS CBC Latest Ref Rng & Units 02/10/2021 02/09/2021 02/08/2021  WBC 4.0 - 10.5 K/uL 6.3 5.5 6.4  Hemoglobin 12.0 - 15.0 g/dL 8.5(L) 8.1(L) 7.7(L)  Hematocrit 36.0 - 46.0 % 29.3(L) 28.1(L) 25.6(L)  Platelets 150 - 400 K/uL 279 249 249  CHADS2VASc score: 6 Home blood pressure, heart rate readings: 120-130s/70-80s Educated on ELIQUIS-PURPOSE AND SIDE EFFECTS Assessed patient finances. Will attempt to get eliquis covered under patient assistance via [place]; patient to provide income documents & out of pocket spend  Follow Up Plan: Telephone follow up appointment with care management team member scheduled for: 1 month  Subjective: [name] [name] is an 80 y.o. year old female who is a primary patient of Loman Brooklyn, FNP.  The CCM team was consulted for assistance with disease management and care coordination needs.    Engaged with patient by telephone for follow up visit in response to provider referral for pharmacy case management and/or care coordination services.   Consent to Services:  The patient was given information about Chronic Care Management services, agreed to services, and gave verbal consent prior to initiation of services.  Please see initial visit note for detailed documentation.   Patient Care Team: Loman Brooklyn, FNP as PCP - General (Family Medicine) Minus Breeding, MD as PCP - Cardiology (Cardiology) Paralee Cancel, MD as Consulting Physician (Orthopedic Surgery) Ilean China, RN as Case Manager [name], NP (Gastroenterology) Minus Breeding, MD as Consulting Physician (Cardiology) Celestia Khat, [place] (Optometry) Lavera Guise, [facility] as Pharmacist (Family Medicine)  Objective:  Lab Results  Component Value Date   CREATININE 1.43 (H) 02/10/2021   CREATININE 1.52 (H) 02/09/2021   CREATININE 1.26 (H) 02/08/2021    Lab Results  Component Value Date   HGBA1C 5.7 (H) 12/25/2020   Last diabetic Eye exam: No results found for: HMDIABEYEEXA  Last diabetic Foot exam: No results found for: HMDIABFOOTEX      Component Value Date/Time   CHOL 108 02/07/2021 0528   CHOL 174 10/20/2020 1017   TRIG 65 02/07/2021 0528   HDL 29 (L) 02/07/2021 0528   HDL 55 10/20/2020 1017   CHOLHDL 3.7 02/07/2021 0528   VLDL 13 02/07/2021 0528   LDLCALC 66 02/07/2021 0528   [place] 90 10/20/2020 1017    Hepatic Function Latest Ref Rng & Units 01/05/2021 12/30/2020 12/24/2020  Total Protein 6.0 - 8.5 g/dL 5.7(L) 5.5(L) 6.4(L)  Albumin 3.7 - 4.7 g/dL 3.5(L) 2.5(L) 3.4(L)  AST 0 - 40 IU/L 13 34 23  ALT 0 - 32 IU/L 17 42 19  Alk Phosphatase 44 - 121 IU/L 329(H) 552(H) 123  Total Bilirubin 0.0 - 1.2 mg/dL 0.3 1.1 1.0  Bilirubin, Direct 0.0 - 0.2 mg/dL - - 0.3(H)    Lab Results  Component Value Date/Time   TSH 3.605 02/07/2021 05:28 AM   TSH 2.850 10/20/2020 10:17 AM   TSH 1.820 07/21/2020 03:48 PM   FREET4 1.42 07/21/2020 03:48 PM    CBC Latest Ref Rng & Units 02/10/2021 02/09/2021 02/08/2021  WBC 4.0 - 10.5 K/uL 6.3 5.5 6.4  Hemoglobin 12.0 - 15.0 g/dL 8.5(L) 8.1(L) 7.7(L)  Hematocrit 36.0 - 46.0 % 29.3(L)  28.1(L) 25.6(L)  Platelets 150 - 400 K/uL 279 249 249    No results found for: VD25OH  Clinical ASCVD: No  The ASCVD Risk score Mikey Bussing DC Jr., et al., 2013) failed to calculate for the following reasons:   The valid total cholesterol range is 130 to 320 mg/dL    Other: (CHADS2VASc if Afib, PHQ9 if depression, MMRC or CAT for COPD, ACT, DEXA)  Social History   Tobacco Use  Smoking Status Never  Smokeless Tobacco Never   BP  Readings from Last 3 Encounters:  02/10/21 122/75  01/21/21 122/79  01/15/21 (!) 146/100   Pulse Readings from Last 3 Encounters:  02/10/21 75  01/21/21 100  01/15/21 100   Wt Readings from Last 3 Encounters:  02/10/21 202 lb 6.4 oz (91.8 kg)  01/21/21 205 lb (93 kg)  01/15/21 203 lb 11.3 oz (92.4 kg)    Assessment: Review of patient past medical history, allergies, medications, health status, including review of consultants reports, laboratory and other test data, was performed as part of comprehensive evaluation and provision of chronic care management services.   SDOH:  (Social Determinants of Health) assessments and interventions performed:    CCM Care Plan  No Known Allergies  Medications Reviewed Today     Reviewed by Lavera Guise, [facility] (Pharmacist) on 02/18/21 at [place] List Status: <None>   Medication Order Taking? Sig Documenting Provider Last Dose Status Informant  acetaminophen (TYLENOL) 325 MG tablet [id number] No Take 2 tablets (650 mg total) by mouth every 6 (six) hours as needed for mild pain or headache (or Fever >/= 101). Roxan Hockey, MD unk Active Self  allopurinol (ZYLOPRIM) 100 MG tablet [id number] No Take 1 tablet (100 mg total) by mouth daily. Loman Brooklyn, FNP 02/06/2021 Active Self  apixaban (ELIQUIS) 5 MG TABS tablet [id number] No Take 1 tablet (5 mg total) by mouth 2 (two) times daily. Minus Breeding, MD 02/06/2021 8pm Active Self  atorvastatin (LIPITOR) 20 MG tablet [id number] No Take 1 tablet (20 mg total) by mouth daily. Bonnielee Haff, MD 02/06/2021 Active Self  Cholecalciferol (VITAMIN D-3) 5000 UNITS TABS [id number] No Take 5,000 Units by mouth daily. [provider] 02/06/2021 Active Self  diclofenac Sodium (VOLTAREN) 1 % GEL [id number] No APPLY 4 GRAMS TOPICALLY 4  TIMES DAILY Loman Brooklyn, FNP Past Week Active Self  EUTHYROX 50 MCG tablet [id number] No TAKE 1 TABLET BY MOUTH ONCE DAILY BEFORE Gara Kroner, MD  02/07/2021 Active Self  ferrous sulfate 325 (65 FE) MG tablet [id number] No Take 325 mg by mouth daily with breakfast. [provider] 02/06/2021 Active Self  furosemide (LASIX) 40 MG tablet [id number]  Take 1 tablet (40 mg total) by mouth daily. Cherene Altes, MD  Active   loratadine (CLARITIN) 10 MG tablet [id number] No Take 10 mg by mouth daily. [provider] 02/06/2021 Active Self  metoprolol succinate (TOPROL-XL) 25 MG 24 hr tablet [id number]  Take 1 tablet (25 mg total) by mouth daily. Cherene Altes, MD  Active   ONE TOUCH ULTRA TEST test strip [id number] No USE UP TO FOUR TIMES DAILY AS DIRECTED Terald Sleeper, PA-C 02/06/2021 Active Self  oxybutynin (DITROPAN-XL) 10 MG 24 hr tablet [id number] No Take 1 tablet (10 mg total) by mouth at bedtime. Loman Brooklyn, FNP 02/06/2021 Active Self  OXYGEN [id number]  Inhale 2 L/min into the lungs continuous. Use while active and while sleeping - may go without while resting during the day  [provider]  Active   pantoprazole (PROTONIX) 40 MG tablet [id number] No Take 1 tablet (40 mg total) by mouth 2 (two) times daily before a meal. Loman Brooklyn, FNP 02/06/2021 Active Self            Patient Active Problem List   Diagnosis Date Noted   Acute pulmonary edema ([place])    Acute combined systolic and diastolic heart failure (HCC)    Nonrheumatic mitral valve regurgitation    Acute CHF (congestive heart failure) ([place]) 02/07/2021   Rapid atrial fibrillation ([place]) 02/07/2021   Atrial fibrillation with RVR ([place]) 02/04/2021   Educated about COVID-19 virus infection 02/02/2020   Iron deficiency anemia due to chronic blood loss    Occult blood in stools    Unprovoked DVT and Unprovoked Pulmonary Embolism -Dxed 03/2019 04/03/2019   Sciatica of right side 10/18/2018   Seborrheic keratosis 10/17/2016   Hyperlipidemia associated with type 2 diabetes mellitus ([place]) 10/17/2016   Overactive bladder 10/17/2016   Gastroesophageal  reflux disease without esophagitis 10/17/2016   Primary osteoarthritis involving multiple joints 10/17/2016   Chronic gout without tophus 10/17/2016   Type 2 diabetes mellitus without complication, without long-term current use of insulin ([place]) 05/16/2016   Essential hypertension 05/16/2016   Hypothyroidism 05/16/2016   Anemia 10/30/2013   Morbid obesity ([place]) 10/30/2013   S/P right THA, AA 10/29/2013    Immunization History  Administered Date(s) Administered   Fluad Quad(high Dose 65+) 04/04/2019, 06/04/2020   Influenza, High Dose Seasonal PF 05/16/2016, 08/01/2017, 08/28/2018   Moderna SARS-COV2 Booster Vaccination 07/02/2020   Moderna Sars-Covid-2 Vaccination 09/18/2019, 10/16/2019   Pneumococcal Conjugate-13 07/02/2020   Zoster Recombinat (Shingrix) 12/03/2019, 10/20/2020    Conditions to be addressed/monitored: Atrial Fibrillation  Care Plan : PHARMD MEDICATION MANAGEMENT  Updates made by Lavera Guise, [place] since 02/18/2021 12:00 AM     Problem: DISEASE PROGRESSION PREVENTION      Long-Range Goal: ATRIAL FIBRILLATION   Priority: High  Note:   Current Barriers:  Unable to independently afford treatment regimen Does not adhere to prescribed medication regimen-DUE TO COST   Pharmacist Clinical Goal(s):  Over the next 90 days, patient will verbalize ability to afford treatment regimen adhere to prescribed medication regimen as evidenced by TAKING MEDICATIONS AS PRESCRIBED  through collaboration with PharmD and provider.    Interventions: 1:1 collaboration with Loman Brooklyn, FNP regarding development and update of comprehensive plan of care as evidenced by provider attestation and co-signature Inter-disciplinary care team collaboration (see longitudinal plan of care) Comprehensive medication review performed; medication list updated in electronic medical record  Atrial Fibrillation: Controlled; current rate/rhythm control:METOP; anticoagulant treatment:  ELIQUIS PATIENT DENIES SIGNS & SYMPTOMS OF BLEEDING--TOLERATING WELL UNABLE TO AFFORD HIGH COPAY OF ELIQUIS CBC Latest Ref Rng & Units 02/10/2021 02/09/2021 02/08/2021  WBC 4.0 - 10.5 K/uL 6.3 5.5 6.4  Hemoglobin 12.0 - 15.0 g/dL 8.5(L) 8.1(L) 7.7(L)  Hematocrit 36.0 - 46.0 % 29.3(L) 28.1(L) 25.6(L)  Platelets 150 - 400 K/uL 279 249 249  CHADS2VASc score: 6 Home blood pressure, heart rate readings: 120-130s/70-80s Educated on ELIQUIS-PURPOSE AND SIDE EFFECTS Assessed patient finances. Will attempt to get eliquis covered under patient assistance via [place]; patient to provide income documents & out of pocket spend   Patient Goals/Self-Care Activities Over the next 90 days, patient will:  - take medications as prescribed  Follow Up Plan: Telephone follow up appointment with care management team member scheduled for: 1 month      Medication  Assistance: Application for ELIQUIS  medication assistance program. in process.  Anticipated assistance start date TBD.  See plan of care for additional detail. AWAITING PATIENT DOCUMENTS  Patient's preferred pharmacy is:  [facility] [address], [place] - [address] [place] [address] Phone: [phone] Fax: [phone]  [facility]  ([place]) - [place], [place] [address] [place] [address] Phone: [phone] Fax: [phone]   Follow Up:  Patient agrees to Care Plan and Follow-up.  Plan: Telephone follow up appointment with care management team member scheduled for:  1 MONTH  Regina Eck, PharmD, BCPS Clinical Pharmacist, [place]  II Phone [phone]

## 2021-02-15 ENCOUNTER — Telehealth: Payer: Medicare Other

## 2021-02-15 DIAGNOSIS — N200 Calculus of kidney: Secondary | ICD-10-CM | POA: Diagnosis not present

## 2021-02-18 NOTE — Patient Instructions (Signed)
Visit Information  PATIENT GOALS:  Goals Addressed               This Visit's Progress     Patient Stated     ATRIAL FIBRILLATION (pt-stated)        Current Barriers:  Unable to independently afford treatment regimen Does not adhere to prescribed medication regimen-DUE TO COST   Pharmacist Clinical Goal(s):  Over the next 90 days, patient will verbalize ability to afford treatment regimen adhere to prescribed medication regimen as evidenced by [place] through collaboration with PharmD and provider.    Interventions: 1:1 collaboration with Loman Brooklyn, FNP regarding development and update of comprehensive plan of care as evidenced by provider attestation and co-signature Inter-disciplinary care team collaboration (see longitudinal plan of care) Comprehensive medication review performed; medication list updated in electronic medical record  Atrial Fibrillation: Controlled; current rate/rhythm control:METOP; anticoagulant treatment: ELIQUIS PATIENT DENIES SIGNS & SYMPTOMS OF BLEEDING--TOLERATING WELL UNABLE TO AFFORD HIGH COPAY OF ELIQUIS CBC Latest Ref Rng & Units 02/10/2021 02/09/2021 02/08/2021  WBC 4.0 - 10.5 K/uL 6.3 5.5 6.4  Hemoglobin 12.0 - 15.0 g/dL 8.5(L) 8.1(L) 7.7(L)  Hematocrit 36.0 - 46.0 % 29.3(L) 28.1(L) 25.6(L)  Platelets 150 - 400 K/uL 279 249 249  CHADS2VASc score: 6 Home blood pressure, heart rate readings: 120-130s/70-80s Educated on ELIQUIS-PURPOSE AND SIDE EFFECTS Assessed patient finances. Will attempt to get eliquis covered under patient assistance via [place]; patient to provide income documents & out of pocket spend   Patient Goals/Self-Care Activities Over the next 90 days, patient will:  - take medications as prescribed  Follow Up Plan: Telephone follow up appointment with care management team member scheduled for: 1 month         The patient verbalized understanding of instructions, educational  materials, and care plan provided today and declined offer to receive copy of patient instructions, educational materials, and care plan.   Telephone follow up appointment with care management team member scheduled for: 1 MONTH  Signature Regina Eck, PharmD, BCPS Clinical Pharmacist, [place]  II Phone [phone]

## 2021-02-24 ENCOUNTER — Encounter: Payer: Self-pay | Admitting: Family Medicine

## 2021-02-24 ENCOUNTER — Other Ambulatory Visit: Payer: Self-pay

## 2021-02-24 ENCOUNTER — Ambulatory Visit (INDEPENDENT_AMBULATORY_CARE_PROVIDER_SITE_OTHER): Payer: Medicare Other | Admitting: Family Medicine

## 2021-02-24 VITALS — BP 141/79 | HR 71 | Temp 97.5°F | Ht 63.0 in | Wt 199.8 lb

## 2021-02-24 DIAGNOSIS — I5020 Unspecified systolic (congestive) heart failure: Secondary | ICD-10-CM

## 2021-02-24 DIAGNOSIS — R300 Dysuria: Secondary | ICD-10-CM

## 2021-02-24 DIAGNOSIS — I361 Nonrheumatic tricuspid (valve) insufficiency: Secondary | ICD-10-CM

## 2021-02-24 DIAGNOSIS — I340 Nonrheumatic mitral (valve) insufficiency: Secondary | ICD-10-CM | POA: Diagnosis not present

## 2021-02-24 DIAGNOSIS — I4891 Unspecified atrial fibrillation: Secondary | ICD-10-CM | POA: Diagnosis not present

## 2021-02-24 DIAGNOSIS — N3001 Acute cystitis with hematuria: Secondary | ICD-10-CM

## 2021-02-24 DIAGNOSIS — R6889 Other general symptoms and signs: Secondary | ICD-10-CM | POA: Diagnosis not present

## 2021-02-24 LAB — URINALYSIS, ROUTINE W REFLEX MICROSCOPIC
Bilirubin, UA: NEGATIVE
Glucose, UA: NEGATIVE
Ketones, UA: NEGATIVE
Nitrite, UA: POSITIVE — AB
Specific Gravity, UA: 1.015 (ref 1.005–1.030)
Urobilinogen, Ur: 0.2 mg/dL (ref 0.2–1.0)
pH, UA: 5 (ref 5.0–7.5)

## 2021-02-24 LAB — MICROSCOPIC EXAMINATION
RBC, Urine: >30 /hpf — AB (ref 0–2)
WBC, UA: >30 /hpf — AB (ref 0–5)

## 2021-02-24 MED ORDER — CEFDINIR 300 MG PO CAPS: 300.0000 mg | ORAL_CAPSULE | Freq: Two times a day (BID) | ORAL | 0 refills | Status: AC

## 2021-02-24 NOTE — Progress Notes (Signed)
Assessment & Plan:  1. Atrial fibrillation with RVR (HCC)/Severe systolic congestive heart failure (HCC)/Nonrheumatic mitral valve regurgitation/Nonrheumatic tricuspid valve regurgitation Patient to keep follow-up with cardiology next week.  - BMP8+EGFR - Anemia Profile B  5. Dysuria - Urinalysis, Routine w reflex microscopic  6. Acute cystitis with hematuria - Urine Culture - cefdinir (OMNICEF) 300 MG capsule; Take 1 capsule (300 mg total) by mouth 2 (two) times daily for 7 days.  Dispense: 14 capsule; Refill: 0   Return as scheduled.  [name], MSN, APRN, FNP-C [facility]  Subjective:    Patient ID: [name] [name], female    DOB: 06/08/1941, 80 y.o.   MRN: [id number]  Patient Care Team: Loman Brooklyn, FNP as PCP - General (Family Medicine) Minus Breeding, MD as PCP - Cardiology (Cardiology) Paralee Cancel, MD as Consulting Physician (Orthopedic Surgery) Ilean China, RN as Case Manager [name], NP (Gastroenterology) Minus Breeding, MD as Consulting Physician (Cardiology) Celestia Khat, [place] (Optometry) Blanca Friend Royce Macadamia, [facility] as Pharmacist (Family Medicine)   Chief Complaint:  Chief Complaint  Patient presents with   Transitions Of Care    7/16 [facility]- A fib     HPI: [name] [name] is a 80 y.o. female presenting on 02/24/2021 for Transitions Of Care (7/16 [facility]- A fib )  Patient is accompanied by her daughter-in-law, [name] [name], who she is okay with being present.   Patient was admitted to [facility] from 02/06/2021-02/10/2021 due to palpitations, shortness of breath, pedal edema, and orthopnea. She has chronic atrial fibrillation but was having acute RVR and was cardioverted in the ER which was successful. She remained in sinus rhythm. She was noted to be in acute newly diagnosed severe systolic CHF with pulmonary edema likely related to A-Fib with RVR. Newly diagnosed with mitral and tricuspid valve regurgitation. Due to  pulmonary edema, she required oxygen when ambulating to keep sats >87%. Furosemide was changed from 20 mg twice daily to 40 mg once daily. Toprol-XL 25 mg daily was added. She has a follow-up scheduled with cardiology on 03/05/2021.  Patient reports she can occasionally feel the fluttering of her heart since she was discharged, but it is at a normal rate. She has not required the oxygen at home. She does monitor her oxygen saturation with activity and states she has seen no readings <94% on room air.  New complaints: Patient complains of dysuria She has had symptoms for a few days. Patient denies fever. Patient does have a history of recurrent UTI.  Patient does not have a history of pyelonephritis.    Social history:  Relevant past medical, surgical, family and social history reviewed and updated as indicated. Interim medical history since our last visit reviewed.  Allergies and medications reviewed and updated.  DATA REVIEWED: CHART IN EPIC  ROS: Negative unless specifically indicated above in HPI.    Current Outpatient Medications:    acetaminophen (TYLENOL) 325 MG tablet, Take 2 tablets (650 mg total) by mouth every 6 (six) hours as needed for mild pain or headache (or Fever >/= 101)., Disp: 12 tablet, Rfl: 0   allopurinol (ZYLOPRIM) 100 MG tablet, Take 1 tablet (100 mg total) by mouth daily., Disp: 90 tablet, Rfl: 1   apixaban (ELIQUIS) 5 MG TABS tablet, Take 1 tablet (5 mg total) by mouth 2 (two) times daily., Disp: 90 tablet, Rfl: 3   atorvastatin (LIPITOR) 20 MG tablet, Take 1 tablet (20 mg total) by mouth daily., Disp: 30 tablet,  Rfl: 2   Cholecalciferol (VITAMIN D-3) 5000 UNITS TABS, Take 5,000 Units by mouth daily., Disp: , Rfl:    diclofenac Sodium (VOLTAREN) 1 % GEL, APPLY 4 GRAMS TOPICALLY 4  TIMES DAILY, Disp: 300 g, Rfl: 0   EUTHYROX 50 MCG tablet, TAKE 1 TABLET BY MOUTH ONCE DAILY BEFORE BREAKFAST, Disp: 30 tablet, Rfl: 10   ferrous sulfate 325 (65 FE) MG tablet, Take 325  mg by mouth daily with breakfast., Disp: , Rfl:    furosemide (LASIX) 40 MG tablet, Take 1 tablet (40 mg total) by mouth daily., Disp: 30 tablet, Rfl: 1   loratadine (CLARITIN) 10 MG tablet, Take 10 mg by mouth daily., Disp: , Rfl:    metoprolol succinate (TOPROL-XL) 25 MG 24 hr tablet, Take 1 tablet (25 mg total) by mouth daily., Disp: 30 tablet, Rfl: 1   ONE TOUCH ULTRA TEST test strip, USE UP TO FOUR TIMES DAILY AS DIRECTED, Disp: 400 each, Rfl: 3   oxybutynin (DITROPAN-XL) 10 MG 24 hr tablet, Take 1 tablet (10 mg total) by mouth at bedtime., Disp: 90 tablet, Rfl: 1   OXYGEN, Inhale 2 L/min into the lungs continuous. Use while active and while sleeping - may go without while resting during the day, Disp: , Rfl:    pantoprazole (PROTONIX) 40 MG tablet, Take 1 tablet (40 mg total) by mouth 2 (two) times daily before a meal., Disp: 180 tablet, Rfl: 1   No Known Allergies Past Medical History:  Diagnosis Date   Anemia    Arthritis    Ostearthritis- hips, knees, fingers   Diabetes mellitus without complication (HCC)    DVT (deep venous thrombosis) (HCC)    Dyspnea    GERD (gastroesophageal reflux disease)    Headache(784.0)    tx. Valproic acid   History of kidney stones    Hypertension    Hypothyroidism    Pulmonary embolism [facility])     Past Surgical History:  Procedure Laterality Date   ABDOMINAL HYSTERECTOMY     BILATERAL HIP ARTHROSCOPY Left    BIOPSY  12/08/2019   Procedure: BIOPSY;  Surgeon: Daneil Dolin, MD;  [facility];  Service: Endoscopy;;   CATARACT EXTRACTION, BILATERAL     COLONOSCOPY N/A 12/08/2019   polyps (tubular adenoma), diverticulosis, colonic lipoma, no surveillance due to age   9 W/ [place] Right 12/25/2020   Procedure: CYSTOSCOPY WITH RETROGRADE PYELOGRAM/URETERAL STENT PLACEMENT;  Surgeon: Janith Lima, MD;  Location: [facility];  Service: Urology;  Laterality: Right;   CYSTOSCOPY/URETEROSCOPY/HOLMIUM LASER/STENT  PLACEMENT Right 01/15/2021   Procedure: CYSTOSCOPY/RETROGRADE/URETEROSCOPY/HOLMIUM LASER/STENT EXCHANGE;  Surgeon: Janith Lima, MD;  Location[facility];  Service: Urology;  Laterality: Right;   ESOPHAGOGASTRODUODENOSCOPY N/A 12/08/2019   normal esophagus with 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   GIVENS CAPSULE STUDY N/A 01/13/2020   Procedure: GIVENS CAPSULE STUDY;  Surgeon: Daneil Dolin, MD;  [facility];  Service: Endoscopy;  Laterality: N/A;  7:30am   MULTIPLE TOOTH EXTRACTIONS     60's   PARATHYROIDECTOMY     POLYPECTOMY  12/08/2019   Procedure: POLYPECTOMY;  Surgeon: Daneil Dolin, MD;  [facility];  Service: Endoscopy;;   TOTAL HIP ARTHROPLASTY Right 10/29/2013   Procedure: RIGHT TOTAL HIP ARTHROPLASTY ANTERIOR APPROACH;  Surgeon: Mauri Pole, MD;  Location[facility];  Service: Orthopedics;  Laterality: Right;    Social History   Socioeconomic History   Marital status: Widowed  Spouse name: Not on file   Number of children: 4   Years of education: 12   Highest education level: High school graduate  Occupational History   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 She has four adult children and one son lives with her.    Social Determinants of Health   Financial Resource Strain: Low Risk    Difficulty of Paying Living Expenses: Not hard at all  Food Insecurity: No Food Insecurity   Worried About Charity fundraiser in the Last Year: Never true   [place] in the Last Year: Never true  Transportation Needs: No Transportation Needs   Lack of  Transportation (Medical): No   Lack of Transportation (Non-Medical): No  Physical Activity: Insufficiently Active   Days of Exercise per Week: 7 days   Minutes of Exercise per Session: 20 min  Stress: No Stress Concern Present   Feeling of Stress : Not at all  Social Connections: Moderately Integrated   Frequency of Communication with Friends and Family: More than three times a week   Frequency of Social Gatherings with Friends and Family: More than three times a week   Attends Religious Services: More than 4 times per year   Active Member of [employer] or Organizations: Yes   Attends Archivist Meetings: More than 4 times per year   Marital Status: Widowed  Human resources officer Violence: Not At Risk   Fear of Current or Ex-Partner: No   Emotionally Abused: No   Physically Abused: No   Sexually Abused: No        Objective:    BP (!) 141/79   Pulse 71   Temp (!) 97.5 F (36.4 C) (Temporal)   Ht 5' 3"  (1.6 m)   Wt 199 lb 12.8 oz (90.6 kg)   SpO2 98%   BMI 35.39 kg/m   Wt Readings from Last 3 Encounters:  02/24/21 199 lb 12.8 oz (90.6 kg)  02/10/21 202 lb 6.4 oz (91.8 kg)  01/21/21 205 lb (93 kg)    Physical Exam Vitals reviewed.  Constitutional:      General: She is not in acute distress.    Appearance: Normal appearance. She is obese. She is not ill-appearing, toxic-appearing or diaphoretic.  HENT:     Head: Normocephalic and atraumatic.  Eyes:     General: No scleral icterus.       Right eye: No discharge.        Left eye: No discharge.     Conjunctiva/sclera: Conjunctivae normal.  Cardiovascular:     Rate and Rhythm: Normal rate and regular rhythm.     Heart sounds: Normal heart sounds. No murmur heard.   No friction rub. No gallop.  Pulmonary:     Effort: Pulmonary effort is normal. No respiratory distress.     Breath sounds: Normal breath sounds. No stridor. No wheezing, rhonchi or rales.  Musculoskeletal:        General: Normal range of motion.      Cervical back: Normal range of motion.  Skin:    General: Skin is warm and dry.     Capillary Refill: Capillary refill takes less than 2 seconds.  Neurological:     General: No focal deficit present.     Mental Status: She is alert and oriented to person, place, and time. Mental status is at baseline.     Gait: Gait abnormal (walks with rolling walker).  Psychiatric:        Mood and Affect: Mood normal.        Behavior: Behavior normal.        Thought Content: Thought content normal.        Judgment: Judgment normal.    Lab Results  Component Value Date   TSH 3.605 02/07/2021   Lab Results  Component Value Date   WBC 6.3 02/10/2021   HGB 8.5 (L) 02/10/2021   HCT 29.3 (L) 02/10/2021   MCV 95.8 02/10/2021   PLT 279 02/10/2021   Lab Results  Component Value Date   NA 139 02/10/2021   K 5.5 (H) 02/10/2021   CO2 27 02/10/2021   GLUCOSE 121 (H) 02/10/2021   BUN 44 (H) 02/10/2021   CREATININE 1.43 (H) 02/10/2021   BILITOT 0.3 01/05/2021   ALKPHOS 329 (H) 01/05/2021   AST 13 01/05/2021   ALT 17 01/05/2021   PROT 5.7 (L) 01/05/2021   ALBUMIN 3.5 (L) 01/05/2021   CALCIUM 10.9 (H) 02/10/2021   ANIONGAP 6 02/10/2021   EGFR 59 (L) 01/05/2021   Lab Results  Component Value Date   CHOL 108 02/07/2021   Lab Results  Component Value Date   HDL 29 (L) 02/07/2021   Lab Results  Component Value Date   LDLCALC 66 02/07/2021   Lab Results  Component Value Date   TRIG 65 02/07/2021   Lab Results  Component Value Date   CHOLHDL 3.7 02/07/2021   Lab Results  Component Value Date   HGBA1C 5.7 (H) 12/25/2020

## 2021-02-24 NOTE — Patient Instructions (Signed)
Reminder: please return after mid-September for your flu shot. If you receive this elsewhere, such as your pharmacy, please let us know so we can get this documented in your chart.   

## 2021-02-25 LAB — ANEMIA PROFILE B
Basophils Absolute: 0 10*3/uL (ref 0.0–0.2)
Basos: 1 %
EOS (ABSOLUTE): 0.2 10*3/uL (ref 0.0–0.4)
Eos: 4 %
Ferritin: 41 ng/mL (ref 15–150)
Folate: 8.2 ng/mL (ref >3.0)
Hematocrit: 31.4 % — ABNORMAL LOW (ref 34.0–46.6)
Hemoglobin: 9.5 g/dL — ABNORMAL LOW (ref 11.1–15.9)
Immature Grans (Abs): 0 10*3/uL (ref 0.0–0.1)
Immature Granulocytes: 0 %
Iron Saturation: >95 % (ref 15–55)
Iron: 360 ug/dL (ref 27–139)
Lymphocytes Absolute: 1.6 10*3/uL (ref 0.7–3.1)
Lymphs: 30 %
MCH: 27.8 pg (ref 26.6–33.0)
MCHC: 30.3 g/dL — ABNORMAL LOW (ref 31.5–35.7)
MCV: 92 fL (ref 79–97)
Monocytes Absolute: 0.8 10*3/uL (ref 0.1–0.9)
Monocytes: 14 %
Neutrophils Absolute: 2.8 10*3/uL (ref 1.4–7.0)
Neutrophils: 51 %
Platelets: 219 10*3/uL (ref 150–450)
RBC: 3.42 x10E6/uL — ABNORMAL LOW (ref 3.77–5.28)
RDW: 14.5 % (ref 11.7–15.4)
Retic Ct Pct: 3.3 % — ABNORMAL HIGH (ref 0.6–2.6)
Total Iron Binding Capacity: <377 ug/dL (ref 250–450)
UIBC: <17 ug/dL — ABNORMAL LOW (ref 118–369)
Vitamin B-12: 122 pg/mL — ABNORMAL LOW (ref 232–1245)
WBC: 5.4 10*3/uL (ref 3.4–10.8)

## 2021-02-25 LAB — BMP8+EGFR
BUN/Creatinine Ratio: 22 (ref 12–28)
BUN: 33 mg/dL — ABNORMAL HIGH (ref 8–27)
CO2: 24 mmol/L (ref 20–29)
Calcium: 11.3 mg/dL — ABNORMAL HIGH (ref 8.7–10.3)
Chloride: 104 mmol/L (ref 96–106)
Creatinine, Ser: 1.5 mg/dL — ABNORMAL HIGH (ref 0.57–1.00)
Glucose: 103 mg/dL — ABNORMAL HIGH (ref 65–99)
Potassium: 4.8 mmol/L (ref 3.5–5.2)
Sodium: 140 mmol/L (ref 134–144)
eGFR: 35 mL/min/{1.73_m2} — ABNORMAL LOW (ref >59)

## 2021-02-27 LAB — URINE CULTURE

## 2021-03-03 NOTE — Progress Notes (Signed)
Cardiology Office Note   Date:  03/05/2021   ID:  [name] [name], [name] [name] 31-Jul-1940, MRN [id number]  PCP:  Loman Brooklyn, FNP  Cardiologist:  Dr. Percival Spanish  CC: Hospital Follow Up-CHF, Atrial fibrillation     History of Present Illness: [name] [name] is a pleasant 80 y.o. female who presents for hospital follow up after admission for acute systolic CHF, with history of persistent atrial fib  CHADS VASC Score of 6,on Eliquis, DVT/PE, hypertension. Other history includes Type 2 diabetes, obesity, hypothyroidism, GERD and chronic anemia.   Repeat echocardiogram on 02/07/21 revealed 35% to 40%, LV global hypokinesis, average LV global longitudinal strain is -[id number], RV systolic function mildly reduced, RV size mildly enlarged, moderate elevated PASP with estimated RVSP 51.5 mmHg, severely dilated LA, moderate dilated RA, severe MR moderate TR, mild aortic sclerosis. (EF reduced from 60 to 65%, severe MR and moderate TR, RVSP 51 are new 04/03/2019).  She was found to have NSVT and runs of atrial tachycardia which required cardioversion when initially seen in the ED, with conversion to NSR. She was continued on metoprolol.  She was diuresed with IV lasix and transitioned to po lasix 40 mg daily. It was felt that the etiology of her decompensated CHF was related to tachycardia induced CM and underlying valvular heart disease. Potassium supplement was discontinued due to hyperkalemia.   She is to be planned for a repeat echocardiogram for evaluation of LVEF. If  EF continues to be reduced she is to be planned for TEE and ischemic evaluation.   She comes today without any cardiac complaints.  She still feels a little tired from her hospitalization but is working her way back up.  She is trying not to overexert but is able to clean her house and do her own ADLs without issue.  She is medically compliant.  She denies any melena hemoptysis or epistaxis.  She has occasional irregular palpitations but  they are rare, and short in duration.  Breathing status has returned to normal.  She is tolerating all of her medications.  She continues to weigh daily and avoid salt.  She states her weight has been in a pound between days and pretty stable.  Past Medical History:  Diagnosis Date   Anemia    Arthritis    Ostearthritis- hips, knees, fingers   Diabetes mellitus without complication (HCC)    DVT (deep venous thrombosis) (HCC)    Dyspnea    GERD (gastroesophageal reflux disease)    Headache(784.0)    tx. Valproic acid   History of kidney stones    Hypertension    Hypothyroidism    Pulmonary embolism [facility])     Past Surgical History:  Procedure Laterality Date   ABDOMINAL HYSTERECTOMY     BILATERAL HIP ARTHROSCOPY Left    BIOPSY  12/08/2019   Procedure: BIOPSY;  Surgeon: Daneil Dolin, MD;  [facility];  Service: Endoscopy;;   CATARACT EXTRACTION, BILATERAL     COLONOSCOPY N/A 12/08/2019   polyps (tubular adenoma), diverticulosis, colonic lipoma, no surveillance due to age   24 W/ [place] Right 12/25/2020   Procedure: CYSTOSCOPY WITH RETROGRADE PYELOGRAM/URETERAL STENT PLACEMENT;  Surgeon: Janith Lima, MD;  [facility];  Service: Urology;  Laterality: Right;   CYSTOSCOPY/URETEROSCOPY/HOLMIUM LASER/STENT PLACEMENT Right 01/15/2021   Procedure: CYSTOSCOPY/RETROGRADE/URETEROSCOPY/HOLMIUM LASER/STENT EXCHANGE;  Surgeon: Janith Lima, MD;  [facility];  Service: Urology;  Laterality: Right;   ESOPHAGOGASTRODUODENOSCOPY N/A 12/08/2019   normal esophagus  with 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   GIVENS CAPSULE STUDY N/A 01/13/2020   Procedure: GIVENS CAPSULE STUDY;  Surgeon: Daneil Dolin, MD;  [facility];  Service: Endoscopy;  Laterality: N/A;  7:30am   MULTIPLE TOOTH EXTRACTIONS     60's   PARATHYROIDECTOMY     POLYPECTOMY  12/08/2019   Procedure: POLYPECTOMY;  Surgeon: Daneil Dolin, MD;  [facility];  Service: Endoscopy;;   TOTAL HIP ARTHROPLASTY Right 10/29/2013   Procedure: RIGHT TOTAL HIP ARTHROPLASTY ANTERIOR APPROACH;  Surgeon: Mauri Pole, MD;  [facility];  Service: Orthopedics;  Laterality: Right;     Current Outpatient Medications  Medication Sig Dispense Refill   acetaminophen (TYLENOL) 325 MG tablet Take 2 tablets (650 mg total) by mouth every 6 (six) hours as needed for mild pain or headache (or Fever >/= 101). 12 tablet 0   allopurinol (ZYLOPRIM) 100 MG tablet Take 1 tablet (100 mg total) by mouth daily. 90 tablet 1   apixaban (ELIQUIS) 5 MG TABS tablet Take 1 tablet (5 mg total) by mouth 2 (two) times daily. 90 tablet 3   atorvastatin (LIPITOR) 20 MG tablet Take 1 tablet (20 mg total) by mouth daily. 30 tablet 2   Cholecalciferol (VITAMIN D-3) 5000 UNITS TABS Take 5,000 Units by mouth daily.     diclofenac Sodium (VOLTAREN) 1 % GEL APPLY 4 GRAMS TOPICALLY 4  TIMES DAILY 300 g 0   EUTHYROX 50 MCG tablet TAKE 1 TABLET BY MOUTH ONCE DAILY BEFORE BREAKFAST 30 tablet 10   ferrous sulfate 325 (65 FE) MG tablet Take 325 mg by mouth daily with breakfast.     furosemide (LASIX) 40 MG tablet Take 1 tablet (40 mg total) by mouth daily. 30 tablet 1   GNP CRANBERRY EXTRACT PO Take by mouth.     loratadine (CLARITIN) 10 MG tablet Take 10 mg by mouth daily.     metoprolol succinate (TOPROL-XL) 25 MG 24 hr tablet Take 1 tablet (25 mg total) by mouth daily. 30 tablet 1   ONE TOUCH ULTRA TEST test strip USE UP TO FOUR TIMES DAILY AS DIRECTED 400 each 3   oxybutynin (DITROPAN-XL) 10 MG 24 hr tablet Take 1 tablet (10 mg total) by mouth at bedtime. 90 tablet 1   pantoprazole (PROTONIX) 40 MG tablet Take 1 tablet (40 mg total) by mouth 2 (two) times daily before a meal. 180 tablet 1   vitamin B-12 (CYANOCOBALAMIN) 500 MCG tablet Take 500 mcg by mouth daily.     No current facility-administered medications for this visit.    Allergies:   Patient  has no known allergies.    Social History:  The patient  reports that she has never smoked. She has never used smokeless tobacco. She reports that she does not drink alcohol and does not use drugs.   Family History:  The patient's family history includes Cancer (age of onset: 68) in her sister; Cancer (age of onset: 41) in her mother; Colitis (age of onset: 28) in her sister; Healthy in her daughter, son, son, and son; Heart attack (age of onset: 70) in her brother; Heart disease in her brother; Heart disease (age of onset: 60) in her father; Pulmonary embolism (age of onset: 71) in her brother.    ROS: All other systems are reviewed and negative. Unless otherwise mentioned in H&P    PHYSICAL EXAM: VS:  BP 138/70 (BP Location: Right Arm, Patient Position:  Sitting, Cuff Size: Large)   Pulse 64   Ht '5\' 3"'$  (1.6 m)   Wt 199 lb 12.8 oz (90.6 kg)   SpO2 98%   BMI 35.39 kg/m  , BMI Body mass index is 35.39 kg/m. GEN: Well nourished, well developed, in no acute distress HEENT: normal Neck: no JVD, carotid bruits, or masses Cardiac: RRR; 1 extrasystole during auscultation, 1/6 systolic murmurs, heard best at the apex, rubs, or gallops,no edema  Respiratory:  Clear to auscultation bilaterally, normal work of breathing GI: soft, nontender, nondistended, + BS MS: no deformity or atrophy Skin: warm and dry, no rash Neuro:  Strength and sensation are intact Psych: euthymic mood, full affect   EKG: Not completed this office visit  Recent Labs: 01/05/2021: ALT 17 02/07/2021: TSH 3.605 02/09/2021: Magnesium 1.8 02/24/2021: BUN 33; Creatinine, Ser 1.50; Hemoglobin 9.5; Platelets 219; Potassium 4.8; Sodium 140    Lipid Panel    Component Value Date/Time   CHOL 108 02/07/2021 0528   CHOL 174 10/20/2020 1017   TRIG 65 02/07/2021 0528   HDL 29 (L) 02/07/2021 0528   HDL 55 10/20/2020 1017   CHOLHDL 3.7 02/07/2021 0528   VLDL 13 02/07/2021 0528   LDLCALC 66 02/07/2021 0528   LDLCALC 90  10/20/2020 1017      Wt Readings from Last 3 Encounters:  03/05/21 199 lb 12.8 oz (90.6 kg)  02/24/21 199 lb 12.8 oz (90.6 kg)  02/10/21 202 lb 6.4 oz (91.8 kg)      Other studies Reviewed:   Echo from 02/07/21:    1. Left ventricular ejection fraction, by estimation, is 35 to 40%. Left  ventricular ejection fraction by 2D MOD biplane is 37.3 %. The left  ventricle has moderately decreased function. The left ventricle  demonstrates global hypokinesis. Left  ventricular diastolic function could not be evaluated. The average left  ventricular global longitudinal strain is -5.2 %. The global longitudinal  strain is abnormal.   2. Right ventricular systolic function is mildly reduced. The right  ventricular size is mildly enlarged. There is moderately elevated  pulmonary artery systolic pressure. The estimated right ventricular  systolic pressure is [id number] mmHg.   3. Left atrial size was severely dilated.   4. Right atrial size was moderately dilated.   5. The mitral valve is degenerative. Severe mitral valve regurgitation.  No evidence of mitral stenosis.   6. The tricuspid valve is abnormal. Tricuspid valve regurgitation is  moderate.   7. The aortic valve is tricuspid. There is mild calcification of the  aortic valve. There is mild thickening of the aortic valve. Aortic valve  regurgitation is not visualized. Mild aortic valve sclerosis is present,  with no evidence of aortic valve  stenosis.   8. The inferior vena cava is dilated in size with <50% respiratory  variability, suggesting right atrial pressure of 15 mmHg.   Comparison(s): Changes from prior study are noted. EF is now 35-40%. RVSP  ~51 mmHG. Severe MR is present.    ASSESSMENT AND PLAN:  1.  Paroxysmal atrial fibrillation: Remains in normal sinus rhythm, and only has rare palpitations which are short-lived.  I will continue her on metoprolol 25 mg daily.  She is also to continue apixaban 5 mg twice daily.  She is  meeting with a pharmacist in [place] to review her "GoodRx" medication limits to assist her with cost.  We have given her a couple boxes of Eliquis samples to assist her as she is in the  donut hole.  2.  Chronic systolic CHF: Currently euvolemic.  Remains on furosemide 40 mg daily, daily weights have been stable.  Creatinine 1.5 on 02/24/2021.  I have explained to her the need to have a repeat echocardiogram to evaluate LV function which will be ordered prior to leaving the office today.  She verbalizes understanding.  3.  Severe mitral valve regurgitation without stenosis: Repeat echocardiogram is planned as discussed above.  I have explained to her about mitral valve regurgitation and symptoms related to this.  We will make further recommendations based upon echocardiogram results.  We will see her again in 3 months for follow-up we will review echocardiogram results when they are available.  4.  Hyperlipidemia: Remains on atorvastatin 20 mg daily.  Review of most recent labs total cholesterol 108, LDL 66, HDL 29.   Current medicines are reviewed at length with the patient today.  I have spent 25 minutes dedicated to the care of this patient on the date of this encounter to include pre-visit review of records, assessment, management and diagnostic testing,with shared decision making.  Labs/ tests ordered today include: Echocardiogram Phill Myron. [place] Pugh, ANP, AACC   03/05/2021 2:21 PM    [facility] [address] Office [phone] Fax [phone]  Notice: This dictation was prepared with Dragon dictation along with smaller phrase technology. Any transcriptional errors that result from this process are unintentional and may not be corrected upon review.

## 2021-03-05 ENCOUNTER — Other Ambulatory Visit: Payer: Self-pay

## 2021-03-05 ENCOUNTER — Ambulatory Visit (INDEPENDENT_AMBULATORY_CARE_PROVIDER_SITE_OTHER): Payer: Medicare Other | Admitting: Adult Health

## 2021-03-05 ENCOUNTER — Encounter: Payer: Self-pay | Admitting: Adult Health

## 2021-03-05 VITALS — BP 138/70 | HR 64 | Ht 63.0 in | Wt 199.8 lb

## 2021-03-05 DIAGNOSIS — I5022 Chronic systolic (congestive) heart failure: Secondary | ICD-10-CM | POA: Diagnosis not present

## 2021-03-05 DIAGNOSIS — I340 Nonrheumatic mitral (valve) insufficiency: Secondary | ICD-10-CM | POA: Diagnosis not present

## 2021-03-05 DIAGNOSIS — E7800 Pure hypercholesterolemia, unspecified: Secondary | ICD-10-CM | POA: Diagnosis not present

## 2021-03-05 DIAGNOSIS — I4891 Unspecified atrial fibrillation: Secondary | ICD-10-CM | POA: Diagnosis not present

## 2021-03-05 MED ORDER — METOPROLOL SUCCINATE ER 25 MG PO TB24: 25.0000 mg | ORAL_TABLET | Freq: Every day | ORAL | 1 refills | Status: DC

## 2021-03-05 NOTE — Patient Instructions (Signed)
Medication Instructions:  No Changes *If you need a refill on your cardiac medications before your next appointment, please call your pharmacy*   Lab Work: No Labs If you have labs (blood work) drawn today and your tests are completely normal, you will receive your results only by: [place] (if you have MyChart) OR A paper copy in the mail If you have any lab test that is abnormal or we need to change your treatment, we will call you to review the results.   Testing/Procedures: [address] [place] has requested that you have an echocardiogram. Echocardiography is a painless test that uses sound waves to create images of your heart. It provides your doctor with information about the size and shape of your heart and how well your heart's chambers and valves are working. This procedure takes approximately one hour. There are no restrictions for this procedure.    Follow-Up: At [facility], you and your health needs are our priority.  As part of our continuing mission to provide you with exceptional heart care, we have created designated Provider Care Teams.  These Care Teams include your primary Cardiologist (physician) and Advanced Practice Providers (APPs -  Physician Assistants and Nurse Practitioners) who all work together to provide you with the care you need, when you need it.  Your next appointment:   3 month(s)  The format for your next appointment:   In Person  Provider:   Minus Breeding, MD

## 2021-03-11 ENCOUNTER — Ambulatory Visit (INDEPENDENT_AMBULATORY_CARE_PROVIDER_SITE_OTHER): Payer: Medicare Other | Admitting: Pharmacist

## 2021-03-11 DIAGNOSIS — I4891 Unspecified atrial fibrillation: Secondary | ICD-10-CM

## 2021-03-11 NOTE — Progress Notes (Signed)
Chronic Care Management Pharmacy Note  03/11/2021 Name:  [name] [name] MRN:  [id number] DOB:  [birth year]  Summary: afib, CKD  Recommendations/Changes made from today's visit: -Consider medication for CKD-GFR 35 Atrial Fibrillation: Controlled; current rate/rhythm control: METOP; anticoagulant treatment: ELIQUIS PATIENT DENIES SIGNS & SYMPTOMS OF BLEEDING--TOLERATING WELL UNABLE TO AFFORD HIGH COPAY OF ELIQUIS CBC Latest Ref Rng & Units 02/10/2021 02/09/2021 02/08/2021  WBC 4.0 - 10.5 K/uL 6.3 5.5 6.4  Hemoglobin 12.0 - 15.0 g/dL 8.5(L) 8.1(L) 7.7(L)  Hematocrit 36.0 - 46.0 % 29.3(L) 28.1(L) 25.6(L)  Platelets 150 - 400 K/uL 279 249 249  CHADS2VASc score: 6 Home blood pressure, heart rate readings: 120-130s/70-80s Educated on ELIQUIS-PURPOSE AND SIDE EFFECTS Assessed patient finances. Will attempt to get eliquis covered under patient assistance via [place]; patient to provide income documents & out of pocket spend today--samples left up front for patient  Follow Up Plan: Telephone follow up appointment with care management team member scheduled for: 1 month  Subjective: [name] [name] is an 80 y.o. year old female who is a primary patient of Loman Brooklyn, FNP.  The CCM team was consulted for assistance with disease management and care coordination needs.    Engaged with patient by telephone for follow up visit in response to provider referral for pharmacy case management and/or care coordination services.   Consent to Services:  The patient was given information about Chronic Care Management services, agreed to services, and gave verbal consent prior to initiation of services.  Please see initial visit note for detailed documentation.   Patient Care Team: Loman Brooklyn, FNP as PCP - General (Family Medicine) Minus Breeding, MD as PCP - Cardiology (Cardiology) Paralee Cancel, MD as Consulting Physician (Orthopedic Surgery) Ilean China, RN as Case  Manager [name], NP (Gastroenterology) Minus Breeding, MD as Consulting Physician (Cardiology) Celestia Khat, [place] (Optometry) Lavera Guise, [facility] as Pharmacist (Family Medicine)  Objective:  Lab Results  Component Value Date   CREATININE 1.50 (H) 02/24/2021   CREATININE 1.43 (H) 02/10/2021   CREATININE 1.52 (H) 02/09/2021    Lab Results  Component Value Date   HGBA1C 5.7 (H) 12/25/2020   Last diabetic Eye exam: No results found for: HMDIABEYEEXA  Last diabetic Foot exam: No results found for: HMDIABFOOTEX      Component Value Date/Time   CHOL 108 02/07/2021 0528   CHOL 174 10/20/2020 1017   TRIG 65 02/07/2021 0528   HDL 29 (L) 02/07/2021 0528   HDL 55 10/20/2020 1017   CHOLHDL 3.7 02/07/2021 0528   VLDL 13 02/07/2021 0528   LDLCALC 66 02/07/2021 0528   [place] 90 10/20/2020 1017    Hepatic Function Latest Ref Rng & Units 01/05/2021 12/30/2020 12/24/2020  Total Protein 6.0 - 8.5 g/dL 5.7(L) 5.5(L) 6.4(L)  Albumin 3.7 - 4.7 g/dL 3.5(L) 2.5(L) 3.4(L)  AST 0 - 40 IU/L 13 34 23  ALT 0 - 32 IU/L 17 42 19  Alk Phosphatase 44 - 121 IU/L 329(H) 552(H) 123  Total Bilirubin 0.0 - 1.2 mg/dL 0.3 1.1 1.0  Bilirubin, Direct 0.0 - 0.2 mg/dL - - 0.3(H)    Lab Results  Component Value Date/Time   TSH 3.605 02/07/2021 05:28 AM   TSH 2.850 10/20/2020 10:17 AM   TSH 1.820 07/21/2020 03:48 PM   FREET4 1.42 07/21/2020 03:48 PM    CBC Latest Ref Rng & Units 02/24/2021 02/10/2021 02/09/2021  WBC 3.4 - 10.8 x10E3/uL 5.4 6.3 5.5  Hemoglobin 11.1 -  15.9 g/dL 9.5(L) 8.5(L) 8.1(L)  Hematocrit 34.0 - 46.6 % 31.4(L) 29.3(L) 28.1(L)  Platelets 150 - 450 x10E3/uL 219 279 249    No results found for: VD25OH  Clinical ASCVD: No  The ASCVD Risk score Mikey Bussing DC Jr., et al., 2013) failed to calculate for the following reasons:   The valid total cholesterol range is 130 to 320 mg/dL    Other: (CHADS2VASc if Afib, PHQ9 if depression, MMRC or CAT for COPD, ACT, DEXA)  Social History    Tobacco Use  Smoking Status Never  Smokeless Tobacco Never   BP Readings from Last 3 Encounters:  03/05/21 138/70  02/24/21 (!) 141/79  02/10/21 122/75   Pulse Readings from Last 3 Encounters:  03/05/21 64  02/24/21 71  02/10/21 75   Wt Readings from Last 3 Encounters:  03/05/21 199 lb 12.8 oz (90.6 kg)  02/24/21 199 lb 12.8 oz (90.6 kg)  02/10/21 202 lb 6.4 oz (91.8 kg)    Assessment: Review of patient past medical history, allergies, medications, health status, including review of consultants reports, laboratory and other test data, was performed as part of comprehensive evaluation and provision of chronic care management services.   SDOH:  (Social Determinants of Health) assessments and interventions performed:    CCM Care Plan  No Known Allergies  Medications Reviewed Today     Reviewed by Lavera Guise, [facility] (Pharmacist) on 03/11/21 at [phone]  Med List Status: <None>   Medication Order Taking? Sig Documenting Provider Last Dose Status Informant  acetaminophen (TYLENOL) 325 MG tablet [id number] No Take 2 tablets (650 mg total) by mouth every 6 (six) hours as needed for mild pain or headache (or Fever >/= 101). Roxan Hockey, MD Taking Active Self  allopurinol (ZYLOPRIM) 100 MG tablet [id number] No Take 1 tablet (100 mg total) by mouth daily. Loman Brooklyn, FNP Taking Active Self  apixaban (ELIQUIS) 5 MG TABS tablet [id number] No Take 1 tablet (5 mg total) by mouth 2 (two) times daily. Minus Breeding, MD Taking Active Self  atorvastatin (LIPITOR) 20 MG tablet [id number] No Take 1 tablet (20 mg total) by mouth daily. Bonnielee Haff, MD Taking Active Self  Cholecalciferol (VITAMIN D3) 1.25 MG (50000 UT) CAPS [id number]  Take 5,000 Units by mouth daily. [provider]  Active   Cyanocobalamin (VITAMIN B-12) 5000 MCG TBDP [id number]  Take 5,000 mcg by mouth daily. [provider]  Active   diclofenac Sodium (VOLTAREN) 1 % GEL [id number] No APPLY 4 GRAMS  TOPICALLY 4  TIMES DAILY Loman Brooklyn, FNP Taking Active Self  EUTHYROX 50 MCG tablet [id number] No TAKE 1 TABLET BY MOUTH ONCE DAILY BEFORE Gara Kroner, MD Taking Active Self  ferrous sulfate 325 (65 FE) MG tablet [id number] No Take 325 mg by mouth daily with breakfast. [provider] Taking Active Self  furosemide (LASIX) 40 MG tablet [id number] No Take 1 tablet (40 mg total) by mouth daily. Cherene Altes, MD Taking Active   [facility] CRANBERRY EXTRACT PO [id number] No Take by mouth. [provider] Taking Active   loratadine (CLARITIN) 10 MG tablet [id number] No Take 10 mg by mouth daily. [provider] Taking Active Self  metoprolol succinate (TOPROL-XL) 25 MG 24 hr tablet [id number]  Take 1 tablet (25 mg total) by mouth daily. Lendon Colonel, NP  Active   ONE TOUCH ULTRA TEST test strip [id number] No USE UP TO FOUR TIMES DAILY AS DIRECTED Terald Sleeper, PA-C Taking Active Self  oxybutynin (  DITROPAN-XL) 10 MG 24 hr tablet [id number] No Take 1 tablet (10 mg total) by mouth at bedtime. Loman Brooklyn, FNP Taking Active Self  pantoprazole (PROTONIX) 40 MG tablet [id number] No Take 1 tablet (40 mg total) by mouth 2 (two) times daily before a meal. Loman Brooklyn, FNP Taking Active Self            Patient Active Problem List   Diagnosis Date Noted   Nonrheumatic tricuspid valve regurgitation 02/24/2021   Acute pulmonary edema (HCC)    Acute combined systolic and diastolic heart failure (HCC)    Nonrheumatic mitral valve regurgitation    Severe systolic congestive heart failure ([place]) 02/07/2021   Rapid atrial fibrillation ([place]) 02/07/2021   Atrial fibrillation with RVR ([place]) 02/04/2021   Educated about COVID-19 virus infection 02/02/2020   Iron deficiency anemia due to chronic blood loss    Occult blood in stools    Unprovoked DVT and Unprovoked Pulmonary Embolism -Dxed 03/2019 04/03/2019   Sciatica of right side 10/18/2018   Seborrheic  keratosis 10/17/2016   Hyperlipidemia associated with type 2 diabetes mellitus ([place]) 10/17/2016   Overactive bladder 10/17/2016   Gastroesophageal reflux disease without esophagitis 10/17/2016   Primary osteoarthritis involving multiple joints 10/17/2016   Chronic gout without tophus 10/17/2016   Type 2 diabetes mellitus without complication, without long-term current use of insulin ([place]) 05/16/2016   Essential hypertension 05/16/2016   Hypothyroidism 05/16/2016   Anemia 10/30/2013   Morbid obesity ([place]) 10/30/2013   S/P right THA, AA 10/29/2013    Immunization History  Administered Date(s) Administered   Fluad Quad(high Dose 65+) 04/04/2019, 06/04/2020   Influenza, High Dose Seasonal PF 05/16/2016, 08/01/2017, 08/28/2018   Moderna SARS-COV2 Booster Vaccination 07/02/2020   Moderna Sars-Covid-2 Vaccination 09/18/2019, 10/16/2019   Pneumococcal Conjugate-13 07/02/2020   Zoster Recombinat (Shingrix) 12/03/2019, 10/20/2020    Conditions to be addressed/monitored: Atrial Fibrillation  Care Plan : PHARMD MEDICATION MANAGEMENT  Updates made by Lavera Guise, [place] since 03/11/2021 12:00 AM     Problem: DISEASE PROGRESSION PREVENTION      Long-Range Goal: ATRIAL FIBRILLATION   This Visit's Progress: On track  Priority: High  Note:   Current Barriers:  Unable to independently afford treatment regimen Does not adhere to prescribed medication regimen-DUE TO COST   Pharmacist Clinical Goal(s):  Over the next 90 days, patient will verbalize ability to afford treatment regimen adhere to prescribed medication regimen as evidenced by [place]  through collaboration with PharmD and provider.    Interventions: 1:1 collaboration with Loman Brooklyn, FNP regarding development and update of comprehensive plan of care as evidenced by provider attestation and co-signature Inter-disciplinary care team collaboration (see longitudinal plan of care) Comprehensive  medication review performed; medication list updated in electronic medical record  Atrial Fibrillation: Controlled; current rate/rhythm control: METOP; anticoagulant treatment: ELIQUIS PATIENT DENIES SIGNS & SYMPTOMS OF BLEEDING--TOLERATING WELL UNABLE TO AFFORD HIGH COPAY OF ELIQUIS CBC Latest Ref Rng & Units 02/10/2021 02/09/2021 02/08/2021  WBC 4.0 - 10.5 K/uL 6.3 5.5 6.4  Hemoglobin 12.0 - 15.0 g/dL 8.5(L) 8.1(L) 7.7(L)  Hematocrit 36.0 - 46.0 % 29.3(L) 28.1(L) 25.6(L)  Platelets 150 - 400 K/uL 279 249 249  CHADS2VASc score: 6 Home blood pressure, heart rate readings: 120-130s/70-80s Educated on ELIQUIS-PURPOSE AND SIDE EFFECTS Assessed patient finances. Will attempt to get eliquis covered under patient assistance via [place]; patient to provide income documents & out of pocket spend today--samples left up front for patient  Patient Goals/Self-Care Activities Over the next 90 days, patient will:  - take medications as prescribed  Follow Up Plan: Telephone follow up appointment with care management team member scheduled for: 1 month      Medication Assistance: Application for eliquis  medication assistance program. in process.  Anticipated assistance start date TBD.  See plan of care for additional detail.  Patient's preferred pharmacy is:  [facility] [address], [place] - [address] [place] [address] Phone: [phone] Fax: [phone]  [facility]  ([place]) - [place], [place] [address] [place] [address] Phone: [phone] Fax: [phone]  Follow Up:  Patient agrees to Care Plan and Follow-up.  Plan: Telephone follow up appointment with care management team member scheduled for:  1 MONTH   Regina Eck, PharmD, BCPS Clinical Pharmacist, [place]  II Phone [phone]

## 2021-03-11 NOTE — Patient Instructions (Signed)
Visit Information  PATIENT GOALS:  Goals Addressed               This Visit's Progress     Patient Stated     ATRIAL FIBRILLATION (pt-stated)        Current Barriers:  Unable to independently afford treatment regimen Does not adhere to prescribed medication regimen-DUE TO COST   Pharmacist Clinical Goal(s):  Over the next 90 days, patient will verbalize ability to afford treatment regimen adhere to prescribed medication regimen as evidenced by [place] through collaboration with PharmD and provider.    Interventions: 1:1 collaboration with Loman Brooklyn, FNP regarding development and update of comprehensive plan of care as evidenced by provider attestation and co-signature Inter-disciplinary care team collaboration (see longitudinal plan of care) Comprehensive medication review performed; medication list updated in electronic medical record  Atrial Fibrillation: Controlled; current rate/rhythm control: METOP; anticoagulant treatment: ELIQUIS PATIENT DENIES SIGNS & SYMPTOMS OF BLEEDING--TOLERATING WELL UNABLE TO AFFORD HIGH COPAY OF ELIQUIS CBC Latest Ref Rng & Units 02/10/2021 02/09/2021 02/08/2021  WBC 4.0 - 10.5 K/uL 6.3 5.5 6.4  Hemoglobin 12.0 - 15.0 g/dL 8.5(L) 8.1(L) 7.7(L)  Hematocrit 36.0 - 46.0 % 29.3(L) 28.1(L) 25.6(L)  Platelets 150 - 400 K/uL 279 249 249  CHADS2VASc score: 6 Home blood pressure, heart rate readings: 120-130s/70-80s Educated on ELIQUIS-PURPOSE AND SIDE EFFECTS Assessed patient finances. Will attempt to get eliquis covered under patient assistance via [place]; patient to provide income documents & out of pocket spend today--samples left up front for patient   Patient Goals/Self-Care Activities Over the next 90 days, patient will:  - take medications as prescribed  Follow Up Plan: Telephone follow up appointment with care management team member scheduled for: 1 month         The patient verbalized  understanding of instructions, educational materials, and care plan provided today and declined offer to receive copy of patient instructions, educational materials, and care plan.   Telephone follow up appointment with care management team member scheduled for: 1 MONTH  Signature Regina Eck, PharmD, BCPS Clinical Pharmacist, [place]  II Phone [phone]

## 2021-03-13 DIAGNOSIS — I5041 Acute combined systolic (congestive) and diastolic (congestive) heart failure: Secondary | ICD-10-CM | POA: Diagnosis not present

## 2021-03-13 DIAGNOSIS — J9601 Acute respiratory failure with hypoxia: Secondary | ICD-10-CM | POA: Diagnosis not present

## 2021-03-18 ENCOUNTER — Ambulatory Visit: Payer: Medicare Other | Admitting: *Deleted

## 2021-03-18 ENCOUNTER — Telehealth: Payer: Self-pay | Admitting: *Deleted

## 2021-03-18 DIAGNOSIS — R300 Dysuria: Secondary | ICD-10-CM

## 2021-03-18 DIAGNOSIS — I10 Essential (primary) hypertension: Secondary | ICD-10-CM

## 2021-03-18 DIAGNOSIS — I4891 Unspecified atrial fibrillation: Secondary | ICD-10-CM

## 2021-03-18 DIAGNOSIS — E119 Type 2 diabetes mellitus without complications: Secondary | ICD-10-CM

## 2021-03-18 NOTE — Telephone Encounter (Signed)
03/18/2021  Prescribed Omnicef on 02/24/21 for UTI. Symptoms resolved while taking it but dysuria returned about a week after finishing it. Do you recommend more antibiotics, an appointment, or a urine specimen? She has an appt with urology on 04/06/21.  Also, patient is taking an OTC iron supplement. Iron and iron sat were high and B12 was low. She has started taking B12 5035mg OTC. Hgb up to 9.5. Should she stop taking the iron since those levels were elevated? When would you like to rck her hemoglobin and B12 levels?  Forwarding to PCP for review and recommendations.   KChong Sicilian BSN, RN-BC Embedded Chronic Care Manager [facility] Direct Dial: [phone]

## 2021-03-20 NOTE — Telephone Encounter (Signed)
She will need an appointment with urine to address dysuria.  She can stop iron supplement. Levels can be rechecked at her next follow-up appointment.

## 2021-03-22 ENCOUNTER — Encounter: Payer: Self-pay | Admitting: *Deleted

## 2021-03-22 NOTE — Telephone Encounter (Signed)
Spoke with patient, appointment scheduled tomorrow at 10:05 am.

## 2021-03-23 ENCOUNTER — Encounter: Payer: Self-pay | Admitting: Family Medicine

## 2021-03-23 ENCOUNTER — Other Ambulatory Visit: Payer: Self-pay

## 2021-03-23 ENCOUNTER — Ambulatory Visit (INDEPENDENT_AMBULATORY_CARE_PROVIDER_SITE_OTHER): Payer: Medicare Other | Admitting: Family Medicine

## 2021-03-23 VITALS — BP 140/68 | HR 75 | Temp 97.7°F | Ht 63.0 in | Wt 197.6 lb

## 2021-03-23 DIAGNOSIS — R300 Dysuria: Secondary | ICD-10-CM | POA: Diagnosis not present

## 2021-03-23 DIAGNOSIS — N3001 Acute cystitis with hematuria: Secondary | ICD-10-CM

## 2021-03-23 LAB — URINALYSIS, ROUTINE W REFLEX MICROSCOPIC
Bilirubin, UA: NEGATIVE
Glucose, UA: NEGATIVE
Ketones, UA: NEGATIVE
Nitrite, UA: POSITIVE — AB
Specific Gravity, UA: 1.01 (ref 1.005–1.030)
Urobilinogen, Ur: 0.2 mg/dL (ref 0.2–1.0)
pH, UA: 5.5 (ref 5.0–7.5)

## 2021-03-23 LAB — MICROSCOPIC EXAMINATION
Epithelial Cells (non renal): NONE SEEN /hpf (ref 0–10)
Renal Epithel, UA: NONE SEEN /hpf
WBC, UA: >30 /hpf — AB (ref 0–5)

## 2021-03-23 MED ORDER — SULFAMETHOXAZOLE-TRIMETHOPRIM 800-160 MG PO TABS: 1.0000 | ORAL_TABLET | Freq: Two times a day (BID) | ORAL | 0 refills | Status: AC

## 2021-03-23 NOTE — Progress Notes (Signed)
Assessment & Plan:  1. Acute cystitis with hematuria Education provided on UTIs. Encouraged adequate hydration. Encouraged her to discuss prophylactic antibiotics with her urologist when she sees them next month. - sulfamethoxazole-trimethoprim (BACTRIM DS) 800-160 MG tablet; Take 1 tablet by mouth 2 (two) times daily for 14 days.  Dispense: 28 tablet; Refill: 0  2. Dysuria - Urinalysis, Routine w reflex microscopic - Urine dipstick shows positive for RBC's, positive for protein, positive for nitrates, and positive for leukocytes.  Micro exam: >30 WBC's per HPF, 0-2 RBC's per HPF, and moderate bacteria. - Urine Culture   Follow up plan: Return as scheduled.  [name], MSN, APRN, FNP-C [facility]  Subjective:   Patient ID: [name] [name], female    DOB: 10-24-1940, 80 y.o.   MRN: [id number]  HPI: [name] [name] is a 80 y.o. female presenting on 03/23/2021 for Dysuria (X 5 days)  Patient is accompanied by her daughter-in-law who she is okay with being present.   Patient complains of dysuria She has had symptoms for 5 days. Patient denies fever. Patient does have a history of recurrent UTI.  Patient does not have a history of pyelonephritis. She has been taking AZO over the weekend which is helpful in relieving the burning. She was treated with cefdinir for a UTI at the beginning of the month which she reports resolved her symptoms for about a week. She is established with urology and will be seeing them in two weeks.    ROS: Negative unless specifically indicated above in HPI.   Relevant past medical history reviewed and updated as indicated.   Allergies and medications reviewed and updated.   Current Outpatient Medications:    acetaminophen (TYLENOL) 325 MG tablet, Take 2 tablets (650 mg total) by mouth every 6 (six) hours as needed for mild pain or headache (or Fever >/= 101)., Disp: 12 tablet, Rfl: 0   allopurinol (ZYLOPRIM) 100 MG tablet,  Take 1 tablet (100 mg total) by mouth daily., Disp: 90 tablet, Rfl: 1   apixaban (ELIQUIS) 5 MG TABS tablet, Take 1 tablet (5 mg total) by mouth 2 (two) times daily., Disp: 90 tablet, Rfl: 3   atorvastatin (LIPITOR) 20 MG tablet, Take 1 tablet (20 mg total) by mouth daily., Disp: 30 tablet, Rfl: 2   Cholecalciferol (VITAMIN D3) 1.25 MG (50000 UT) CAPS, Take 5,000 Units by mouth daily., Disp: , Rfl:    Cyanocobalamin (VITAMIN B-12) 5000 MCG TBDP, Take 5,000 mcg by mouth daily., Disp: , Rfl:    diclofenac Sodium (VOLTAREN) 1 % GEL, APPLY 4 GRAMS TOPICALLY 4  TIMES DAILY, Disp: 300 g, Rfl: 0   EUTHYROX 50 MCG tablet, TAKE 1 TABLET BY MOUTH ONCE DAILY BEFORE BREAKFAST, Disp: 30 tablet, Rfl: 10   furosemide (LASIX) 40 MG tablet, Take 1 tablet (40 mg total) by mouth daily., Disp: 30 tablet, Rfl: 1   GNP CRANBERRY EXTRACT PO, Take by mouth., Disp: , Rfl:    loratadine (CLARITIN) 10 MG tablet, Take 10 mg by mouth daily., Disp: , Rfl:    metoprolol succinate (TOPROL-XL) 25 MG 24 hr tablet, Take 1 tablet (25 mg total) by mouth daily., Disp: 30 tablet, Rfl: 1   ONE TOUCH ULTRA TEST test strip, USE UP TO FOUR TIMES DAILY AS DIRECTED, Disp: 400 each, Rfl: 3   oxybutynin (DITROPAN-XL) 10 MG 24 hr tablet, Take 1 tablet (10 mg total) by mouth at bedtime., Disp: 90 tablet, Rfl: 1   pantoprazole (PROTONIX) 40 MG  tablet, Take 1 tablet (40 mg total) by mouth 2 (two) times daily before a meal., Disp: 180 tablet, Rfl: 1  No Known Allergies  Objective:   BP 140/68   Pulse 75   Temp 97.7 F (36.5 C) (Temporal)   Ht '5\' 3"'$  (1.6 m)   Wt 197 lb 9.6 oz (89.6 kg)   SpO2 96%   BMI 35.00 kg/m    Physical Exam Vitals reviewed.  Constitutional:      General: She is not in acute distress.    Appearance: Normal appearance. She is obese. She is not ill-appearing, toxic-appearing or diaphoretic.  HENT:     Head: Normocephalic and atraumatic.  Eyes:     General: No scleral icterus.       Right eye: No discharge.         Left eye: No discharge.     Conjunctiva/sclera: Conjunctivae normal.  Cardiovascular:     Rate and Rhythm: Normal rate.  Pulmonary:     Effort: Pulmonary effort is normal. No respiratory distress.  Musculoskeletal:        General: Normal range of motion.     Cervical back: Normal range of motion.  Skin:    General: Skin is warm and dry.     Capillary Refill: Capillary refill takes less than 2 seconds.  Neurological:     General: No focal deficit present.     Mental Status: She is alert and oriented to person, place, and time. Mental status is at baseline.  Psychiatric:        Mood and Affect: Mood normal.        Behavior: Behavior normal.        Thought Content: Thought content normal.        Judgment: Judgment normal.

## 2021-03-23 NOTE — Patient Instructions (Signed)

## 2021-03-24 DIAGNOSIS — E119 Type 2 diabetes mellitus without complications: Secondary | ICD-10-CM | POA: Diagnosis not present

## 2021-03-24 DIAGNOSIS — I4891 Unspecified atrial fibrillation: Secondary | ICD-10-CM | POA: Diagnosis not present

## 2021-03-24 DIAGNOSIS — I10 Essential (primary) hypertension: Secondary | ICD-10-CM | POA: Diagnosis not present

## 2021-03-25 LAB — URINE CULTURE

## 2021-04-01 ENCOUNTER — Other Ambulatory Visit: Payer: Self-pay

## 2021-04-01 ENCOUNTER — Ambulatory Visit (HOSPITAL_COMMUNITY): Payer: Medicare Other | Attending: Cardiology

## 2021-04-01 DIAGNOSIS — I4891 Unspecified atrial fibrillation: Secondary | ICD-10-CM | POA: Insufficient documentation

## 2021-04-01 LAB — ECHOCARDIOGRAM COMPLETE
Area-P 1/2: 3.08 cm2
MV M vel: 5.54 m/s
MV Peak grad: 122.5 mmHg
Radius: 1 cm
S' Lateral: 3.95 cm

## 2021-04-01 NOTE — Patient Instructions (Signed)
Visit Information  PATIENT GOALS:  Goals Addressed             This Visit's Progress    Monitor and Manage My Blood Sugar-Diabetes Type 2   On track    Timeframe:  Long-Range Goal Priority:  Medium Start Date: 02/02/21                            Expected End Date:                       Follow Up Date 04/14/21   Check and record blood sugar daily Call PCP with any readings outside of recommended range Continue to follow an ADA/Carb modified diet Keep all medical appointments Schedule diabetic eye exam Check feet daily and call PCP with any new sores, calluses, discoloration, etc Call RN Care Manager as needed [phone]    Why is this important?   Checking your blood sugar at home helps to keep it from getting very high or very low.  Writing the results in a diary or log helps the doctor know how to care for you.  Your blood sugar log should have the time, date and the results.  Also, write down the amount of insulin or other medicine that you take.  Other information, like what you ate, exercise done and how you were feeling, will also be helpful.     Notes:      Track and Manage Heart Rate and Rhythm-Atrial Fibrillation   On track    Timeframe:  Long-Range Goal Priority:  High Start Date:   02/04/21                          Expected End Date:    02/04/22                   Follow Up Date 04/14/21   Keep a symptoms diary Check and record pulse rate daily and as needed Keep all medical appointments Take medications as prescribed Talk with PharmD about Eliquis prescription assistance Call PCP or cardiologist with any new or worsening symptoms Seek appropriate medical attention for any new or worsening symptoms Call [facility] as needed [phone]    Why is this important?   Atrial fibrillation may have no symptoms. Sometimes the symptoms get worse or happen more often.  It is important to keep track of what your symptoms are and when they happen.  A change in symptoms  is important to discuss with your doctor or nurse.  Being active and healthy eating will also help you manage your heart condition.     Notes:      Track and Manage My Blood Pressure-Hypertension   On track    Timeframe:  Long-Range Goal Priority:  Medium Start Date:    02/02/21                         Expected End Date:    02/02/22                   Follow Up Date 04/14/21   Check and record blood pressure and pulse daily and as needed Call PCP or cardiologist with any readings outside of recommended range Take all medication prescribed Keep all medical appointments Increase physical activity as tolerated with a goal of 150 minutes a week Call RN Care  Manager as needed [phone]   Why is this important?   You won't feel high blood pressure, but it can still hurt your blood vessels.  High blood pressure can cause heart or kidney problems. It can also cause a stroke.  Making lifestyle changes like losing a little weight or eating less salt will help.  Checking your blood pressure at home and at different times of the day can help to control blood pressure.  If the doctor prescribes medicine remember to take it the way the doctor ordered.  Call the office if you cannot afford the medicine or if there are questions about it.     Notes:         Patient verbalizes understanding of instructions provided today and agrees to view in [place].   Plan:Telephone follow up appointment with care management team member scheduled for:  04/14/21 and The patient has been provided with contact information for the care management team and has been advised to call with any health related questions or concerns.   Chong Sicilian, BSN, RN-BC Embedded Chronic Care Manager [facility] / [place] Management Direct Dial: [phone]

## 2021-04-01 NOTE — Chronic Care Management (AMB) (Signed)
Chronic Care Management   CCM RN Visit Note  03/18/2021 Name: [name] [name] MRN: [id number] DOB: 09-Mar-1941  Subjective: [name] [name] is a 80 y.o. year old female who is a primary care patient of Loman Brooklyn, FNP. The care management team was consulted for assistance with disease management and care coordination needs.    Engaged with patient by telephone for follow up visit in response to provider referral for case management and/or care coordination services.   Consent to Services:  The patient was given information about Chronic Care Management services, agreed to services, and gave verbal consent prior to initiation of services.  Please see initial visit note for detailed documentation.   Patient agreed to services and verbal consent obtained.   Assessment: Review of patient past medical history, allergies, medications, health status, including review of consultants reports, laboratory and other test data, was performed as part of comprehensive evaluation and provision of chronic care management services.   SDOH (Social Determinants of Health) assessments and interventions performed:    CCM Care Plan  No Known Allergies  Outpatient Encounter Medications as of 03/18/2021  Medication Sig   acetaminophen (TYLENOL) 325 MG tablet Take 2 tablets (650 mg total) by mouth every 6 (six) hours as needed for mild pain or headache (or Fever >/= 101).   allopurinol (ZYLOPRIM) 100 MG tablet Take 1 tablet (100 mg total) by mouth daily.   apixaban (ELIQUIS) 5 MG TABS tablet Take 1 tablet (5 mg total) by mouth 2 (two) times daily.   atorvastatin (LIPITOR) 20 MG tablet Take 1 tablet (20 mg total) by mouth daily.   Cholecalciferol (VITAMIN D3) 1.25 MG (50000 UT) CAPS Take 5,000 Units by mouth daily.   Cyanocobalamin (VITAMIN B-12) 5000 MCG TBDP Take 5,000 mcg by mouth daily.   diclofenac Sodium (VOLTAREN) 1 % GEL APPLY 4 GRAMS TOPICALLY 4  TIMES DAILY   EUTHYROX 50 MCG tablet TAKE 1  TABLET BY MOUTH ONCE DAILY BEFORE BREAKFAST   furosemide (LASIX) 40 MG tablet Take 1 tablet (40 mg total) by mouth daily.   GNP CRANBERRY EXTRACT PO Take by mouth.   loratadine (CLARITIN) 10 MG tablet Take 10 mg by mouth daily.   metoprolol succinate (TOPROL-XL) 25 MG 24 hr tablet Take 1 tablet (25 mg total) by mouth daily.   ONE TOUCH ULTRA TEST test strip USE UP TO FOUR TIMES DAILY AS DIRECTED   oxybutynin (DITROPAN-XL) 10 MG 24 hr tablet Take 1 tablet (10 mg total) by mouth at bedtime.   pantoprazole (PROTONIX) 40 MG tablet Take 1 tablet (40 mg total) by mouth 2 (two) times daily before a meal.   [DISCONTINUED] ferrous sulfate 325 (65 FE) MG tablet Take 325 mg by mouth daily with breakfast.   No facility-administered encounter medications on file as of 03/18/2021.    Patient Active Problem List   Diagnosis Date Noted   Nonrheumatic tricuspid valve regurgitation 02/24/2021   Acute pulmonary edema (HCC)    Acute combined systolic and diastolic heart failure ([place])    Nonrheumatic mitral valve regurgitation    Severe systolic congestive heart failure ([place]) 02/07/2021   Rapid atrial fibrillation ([place]) 02/07/2021   Atrial fibrillation with RVR ([place]) 02/04/2021   Educated about COVID-19 virus infection 02/02/2020   Iron deficiency anemia due to chronic blood loss    Occult blood in stools    Unprovoked DVT and Unprovoked Pulmonary Embolism -Dxed 03/2019 04/03/2019   Sciatica of right side 10/18/2018   Seborrheic keratosis 10/17/2016  Hyperlipidemia associated with type 2 diabetes mellitus ([place]) 10/17/2016   Overactive bladder 10/17/2016   Gastroesophageal reflux disease without esophagitis 10/17/2016   Primary osteoarthritis involving multiple joints 10/17/2016   Chronic gout without tophus 10/17/2016   Type 2 diabetes mellitus without complication, without long-term current use of insulin ([place]) 05/16/2016   Essential hypertension 05/16/2016   Hypothyroidism 05/16/2016   Anemia  10/30/2013   Morbid obesity ([place]) 10/30/2013   S/P right THA, AA 10/29/2013    Conditions to be addressed/monitored:Atrial Fibrillation, HTN, DMII, and UTI  Care Plan : [facility] Care Plan     Problem: Chronic Disease Management Needs (DM, HTN, HLD, Afib, chronic anticoagulation)   Priority: Medium     Long-Range Goal: Work with [place] Coordination Regarding Chronic Medical Conditions   Start Date: 02/02/2021  Recent Progress: On track  Priority: Medium  Note:   Current Barriers:  Chronic Disease Management support and education needs related to Atrial Fibrillation, HTN, HLD, and DMII  RNCM Clinical Goal(s):  Patient will work with RN Case Manager to address needs related to Atrial Fibrillation, HTN, HLD, and DMII and recent hospitalization for septic shock secondary to kidney stone and UTI demonstrate ongoing self health care management ability through collaboration with RN Care manager, provider, and care team.   Interventions: 1:1 collaboration with Loman Brooklyn, FNP regarding development and update of comprehensive plan of care as evidenced by provider attestation and co-signature  Inter-disciplinary care team collaboration (see longitudinal plan of care) Reviewed chart including relevant office notes, lab results, and consultation reports   Diabetes:  (Status: Goal on track: YES.) Lab Results  Component Value Date   HGBA1C 5.7 (H) 12/25/2020   HGBA1C 5.7 10/20/2020   HGBA1C 5.4 06/04/2020   Lab Results  Component Value Date   [place] 66 02/07/2021   CREATININE 1.50 (H) 02/24/2021  Assessed patient's understanding of A1c goal: <7% Reviewed and discussed most recent A1C Reviewed medications with patient and discussed importance of medication adherence; Counseled on importance of regular laboratory monitoring as prescribed; Discussed plans with patient for ongoing care management follow up and provided patient with direct contact information for  care management team; Reviewed scheduled/upcoming provider appointments including: Lottie Dawson, PharmD on 02/12/21 for prescription assistance; Encouraged to continue to follow an ADA/carb modified diet  Hypertension: (Status: Goal on track: YES.) Last practice recorded BP readings:  BP Readings from Last 3 Encounters:  03/23/21 140/68  03/05/21 138/70  02/24/21 (!) 141/79   Evaluation of current treatment plan related to hypertension self management and patient's adherence to plan as established by provider; Reviewed medications with patient and discussed importance of compliance; Discussed plans with patient for ongoing care management follow up and provided patient with direct contact information for care management team; Advised patient, providing education and rationale, to monitor blood pressure daily and record, calling PCP for findings outside established parameters;  Reviewed scheduled/upcoming provider appointments including:  04/03/21 with PCP  Reviewed and discussed recent office visit with cardiologist  Questioned mobility and physical activity level. Both are improving. Physical and occupational therapy have ended. Continues to use a walker from time to time but is mostly ambulating on her own without difficulty. She is able to perform ADLs independently.  Discussed oxygen use. Patient is not using oxygen and is waiting for supplier to pick it up. O2 levels have been normal during the day and night.   HDL:  (Status: Goal on track: YES. No changes this visit.) Evaluation of  current treatment plan related to HLD self-management and patient's adherence to plan as established by provider. Discussed plans with patient for ongoing care management follow up and provided patient with direct contact information for care management team Reviewed and discussed recent lab results Reviewed and discussed medications and compliance Encouraged patient to continue increasing physical activity  level as tolerated  A-fib:  (Status: Goal on track: YES.) Counseled on increased risk of stroke due to Afib and benefits of anticoagulation for stroke prevention Reviewed importance of adherence to anticoagulant exactly as prescribed Counseled on avoidance of NSAIDs due to increased bleeding risk with anticoagulants Counseled on seeking medical attention after a head injury or if there is blood in the urine/stool Afib action plan reviewed Assessed social determinant of health barriers Cost of eliquis. Working with PharmD regarding prescription assistance Reviewed and discussed upcoming appointments Encouraged to take all medication as prescribed Advised to call cardiologist or PCP with anynew or worsening symptoms and ot seek emergency medical attention if needed Monitor and record heart rate daily and as needed Take record to cardiology and PCP office visits  Urinary: (New Acute Goal) Discussed recent UTI and completion of antibiotics Dysuria and cloudy urine returned about 4 days after finishing antibiotics Reviewed last UA and culture results with patient Collaborated with PCP and clinical staff to schedule a follow-up appointment   Patient Goals/Self-Care Activities: Patient will self administer medications as prescribed Patient will attend all scheduled provider appointments Patient will continue to perform ADL's independently Patient will call provider office for new concerns or questions      Plan:Telephone follow up appointment with care management team member scheduled for:  04/14/21 and The patient has been provided with contact information for the care management team and has been advised to call with any health related questions or concerns.   Chong Sicilian, BSN, RN-BC Embedded Chronic Care Manager [facility] / [place] [facility] Direct Dial: [phone]

## 2021-04-02 ENCOUNTER — Other Ambulatory Visit: Payer: Self-pay

## 2021-04-02 ENCOUNTER — Telehealth: Payer: Self-pay

## 2021-04-02 MED ORDER — ATORVASTATIN CALCIUM 20 MG PO TABS: 20.0000 mg | ORAL_TABLET | Freq: Every day | ORAL | 2 refills | Status: DC

## 2021-04-02 NOTE — Telephone Encounter (Addendum)
Spoke with patient regarding echocardiogram results. Scheduled appointment with Jory Sims for 10/7 10:15 AM to discuss TEE. ----- Message from Lendon Colonel, NP sent at 04/02/2021  7:07 AM EDT ----- [name] [name]'s echo showed that her pumping function is back to normal, but she has significant disease in her mitral valve.  She will need a TEE to assess this better and then may need referral to the Valvular clinic.  Please make appointment with her to have this discussed so that we can explain this all to her and see if she is interested in this procedure.

## 2021-04-06 ENCOUNTER — Telehealth: Payer: Medicare Other

## 2021-04-06 DIAGNOSIS — Z87442 Personal history of urinary calculi: Secondary | ICD-10-CM | POA: Diagnosis not present

## 2021-04-06 DIAGNOSIS — E213 Hyperparathyroidism, unspecified: Secondary | ICD-10-CM | POA: Diagnosis not present

## 2021-04-06 DIAGNOSIS — R82991 Hypocitraturia: Secondary | ICD-10-CM | POA: Diagnosis not present

## 2021-04-06 DIAGNOSIS — N281 Cyst of kidney, acquired: Secondary | ICD-10-CM | POA: Diagnosis not present

## 2021-04-09 NOTE — Progress Notes (Signed)
Received notification from [place] ([place]) regarding approval for [employer]. Patient assistance approved from 04/07/21 to 07/24/21.  Phone: [phone]

## 2021-04-13 DIAGNOSIS — J9601 Acute respiratory failure with hypoxia: Secondary | ICD-10-CM | POA: Diagnosis not present

## 2021-04-13 DIAGNOSIS — I5041 Acute combined systolic (congestive) and diastolic (congestive) heart failure: Secondary | ICD-10-CM | POA: Diagnosis not present

## 2021-04-14 ENCOUNTER — Ambulatory Visit (INDEPENDENT_AMBULATORY_CARE_PROVIDER_SITE_OTHER): Payer: Medicare Other | Admitting: *Deleted

## 2021-04-14 DIAGNOSIS — I4891 Unspecified atrial fibrillation: Secondary | ICD-10-CM

## 2021-04-14 DIAGNOSIS — I10 Essential (primary) hypertension: Secondary | ICD-10-CM

## 2021-04-14 NOTE — Patient Instructions (Signed)
Visit Information  PATIENT GOALS:  Goals Addressed             This Visit's Progress    Track and Manage Heart Rate and Rhythm-Atrial Fibrillation   On track    Timeframe:  Long-Range Goal Priority:  High Start Date:   02/04/21                          Expected End Date:    02/04/22                   Follow Up Date: 05/14/21 with RNCM for routine f/u. Will follow-up sooner after hearing from cardiology re bradycardia   Keep a symptoms diary Check and record pulse rate daily and as needed Keep all medical appointments Take medications as prescribed Call PCP [phone] or cardiologist [phone] with any new or worsening symptoms Seek appropriate medical attention for any new or worsening symptoms Call [facility] as needed [phone]    Why is this important?   Atrial fibrillation may have no symptoms. Sometimes the symptoms get worse or happen more often.  It is important to keep track of what your symptoms are and when they happen.  A change in symptoms is important to discuss with your doctor or nurse.  Being active and healthy eating will also help you manage your heart condition.     Notes:         Patient verbalizes understanding of instructions provided today and agrees to view in [place].   Plan:Telephone follow up appointment with care management team member scheduled for:  05/14/21 with RNCM and The patient has been provided with contact information for the care management team and has been advised to call with any health related questions or concerns.   Chong Sicilian, BSN, RN-BC Embedded Chronic Care Manager [facility] / [place] [facility] Direct Dial: [phone]

## 2021-04-14 NOTE — Chronic Care Management (AMB) (Signed)
Chronic Care Management   CCM RN Visit Note  04/14/2021 Name: [name] [name] MRN: [id number] DOB: 04-09-1941  Subjective: [name] [name] is a 80 y.o. year old female who is a primary care patient of Loman Brooklyn, FNP. The care management team was consulted for assistance with disease management and care coordination needs.    Engaged with patient by telephone for follow up visit in response to provider referral for case management and/or care coordination services.   Consent to Services:  The patient was given information about Chronic Care Management services, agreed to services, and gave verbal consent prior to initiation of services.  Please see initial visit note for detailed documentation.   Patient agreed to services and verbal consent obtained.   Assessment: Review of patient past medical history, allergies, medications, health status, including review of consultants reports, laboratory and other test data, was performed as part of comprehensive evaluation and provision of chronic care management services.   SDOH (Social Determinants of Health) assessments and interventions performed:    CCM Care Plan  No Known Allergies  Outpatient Encounter Medications as of 04/14/2021  Medication Sig   acetaminophen (TYLENOL) 325 MG tablet Take 2 tablets (650 mg total) by mouth every 6 (six) hours as needed for mild pain or headache (or Fever >/= 101).   allopurinol (ZYLOPRIM) 100 MG tablet Take 1 tablet (100 mg total) by mouth daily.   apixaban (ELIQUIS) 5 MG TABS tablet Take 1 tablet (5 mg total) by mouth 2 (two) times daily.   atorvastatin (LIPITOR) 20 MG tablet Take 1 tablet (20 mg total) by mouth daily.   Cholecalciferol (VITAMIN D3) 1.25 MG (50000 UT) CAPS Take 5,000 Units by mouth daily.   Cyanocobalamin (VITAMIN B-12) 5000 MCG TBDP Take 5,000 mcg by mouth daily.   diclofenac Sodium (VOLTAREN) 1 % GEL APPLY 4 GRAMS TOPICALLY 4  TIMES DAILY   EUTHYROX 50 MCG tablet TAKE 1  TABLET BY MOUTH ONCE DAILY BEFORE BREAKFAST   furosemide (LASIX) 40 MG tablet Take 1 tablet (40 mg total) by mouth daily.   GNP CRANBERRY EXTRACT PO Take by mouth.   loratadine (CLARITIN) 10 MG tablet Take 10 mg by mouth daily.   metoprolol succinate (TOPROL-XL) 25 MG 24 hr tablet Take 1 tablet (25 mg total) by mouth daily.   ONE TOUCH ULTRA TEST test strip USE UP TO FOUR TIMES DAILY AS DIRECTED   oxybutynin (DITROPAN-XL) 10 MG 24 hr tablet Take 1 tablet (10 mg total) by mouth at bedtime.   pantoprazole (PROTONIX) 40 MG tablet Take 1 tablet (40 mg total) by mouth 2 (two) times daily before a meal.   No facility-administered encounter medications on file as of 04/14/2021.    Patient Active Problem List   Diagnosis Date Noted   Nonrheumatic tricuspid valve regurgitation 02/24/2021   Acute pulmonary edema (HCC)    Acute combined systolic and diastolic heart failure ([place])    Nonrheumatic mitral valve regurgitation    Severe systolic congestive heart failure ([place]) 02/07/2021   Rapid atrial fibrillation ([place]) 02/07/2021   Atrial fibrillation with RVR ([place]) 02/04/2021   Educated about COVID-19 virus infection 02/02/2020   Iron deficiency anemia due to chronic blood loss    Occult blood in stools    Unprovoked DVT and Unprovoked Pulmonary Embolism -Dxed 03/2019 04/03/2019   Sciatica of right side 10/18/2018   Seborrheic keratosis 10/17/2016   Hyperlipidemia associated with type 2 diabetes mellitus ([place]) 10/17/2016   Overactive bladder 10/17/2016  Gastroesophageal reflux disease without esophagitis 10/17/2016   Primary osteoarthritis involving multiple joints 10/17/2016   Chronic gout without tophus 10/17/2016   Type 2 diabetes mellitus without complication, without long-term current use of insulin ([place]) 05/16/2016   Essential hypertension 05/16/2016   Hypothyroidism 05/16/2016   Anemia 10/30/2013   Morbid obesity ([place]) 10/30/2013   S/P right THA, AA 10/29/2013    Conditions to be  addressed/monitored:Atrial Fibrillation, HTN, and DMII  Care Plan : [place]  Updates made by Ilean China, RN since 04/14/2021 12:00 AM     Problem: Chronic Disease Management Needs (DM, HTN, HLD, Afib, chronic anticoagulation)   Priority: Medium     Long-Range Goal: Work with [place] Coordination Regarding Chronic Medical Conditions   Start Date: 02/02/2021  This Visit's Progress: On track  Recent Progress: On track  Priority: Medium  Note:   Current Barriers:  Chronic Disease Management support and education needs related to Atrial Fibrillation, HTN, HLD, and DMII  RNCM Clinical Goal(s):  Patient will work with RN Case Manager to address needs related to Atrial Fibrillation, HTN, HLD, and DMII and recent hospitalization for septic shock secondary to kidney stone and UTI demonstrate ongoing self health care management ability through collaboration with RN Care manager, provider, and care team.   Interventions: 1:1 collaboration with Loman Brooklyn, FNP regarding development and update of comprehensive plan of care as evidenced by provider attestation and co-signature  Inter-disciplinary care team collaboration (see longitudinal plan of care) Reviewed chart including relevant office notes, lab results, and consultation reports   Diabetes:  (Status: Condition stable. Not addressed this visit.) Lab Results  Component Value Date   HGBA1C 5.7 (H) 12/25/2020   HGBA1C 5.7 10/20/2020   HGBA1C 5.4 06/04/2020   Lab Results  Component Value Date   LDLCALC 66 02/07/2021   CREATININE 1.50 (H) 02/24/2021  Assessed patient's understanding of A1c goal: <7% Reviewed and discussed most recent A1C Reviewed medications with patient and discussed importance of medication adherence; Counseled on importance of regular laboratory monitoring as prescribed; Discussed plans with patient for ongoing care management follow up and provided patient with direct contact  information for care management team; Reviewed scheduled/upcoming provider appointments including: Lottie Dawson, PharmD on 02/12/21 for prescription assistance; Encouraged to continue to follow an ADA/carb modified diet  Hypertension: (Status: Condition stable. Not addressed this visit.) Last practice recorded BP readings:  BP Readings from Last 3 Encounters:  03/23/21 140/68  03/05/21 138/70  02/24/21 (!) 141/79   Evaluation of current treatment plan related to hypertension self management and patient's adherence to plan as established by provider; Reviewed medications with patient and discussed importance of compliance; Discussed plans with patient for ongoing care management follow up and provided patient with direct contact information for care management team; Advised patient, providing education and rationale, to monitor blood pressure daily and record, calling PCP for findings outside established parameters;  Reviewed scheduled/upcoming provider appointments including:  04/03/21 with PCP  Reviewed and discussed recent office visit with cardiologist  Questioned mobility and physical activity level. Both are improving. Physical and occupational therapy have ended. Continues to use a walker from time to time but is mostly ambulating on her own without difficulty. She is able to perform ADLs independently.  Discussed oxygen use. Patient is not using oxygen and is waiting for supplier to pick it up. O2 levels have been normal during the day and night.   HDL:  (Status: Goal on track: YES. No  changes this visit.) Evaluation of current treatment plan related to HLD self-management and patient's adherence to plan as established by provider. Discussed plans with patient for ongoing care management follow up and provided patient with direct contact information for care management team Reviewed and discussed recent lab results Reviewed and discussed medications and compliance Encouraged patient to  continue increasing physical activity level as tolerated  A-fib:  (Status: Goal on track: YES.) Afib action plan reviewed Assessed social determinant of health barriers Discussed prescription assistance approval for Eliquis. Approved until 07/24/21. Expecting shipment to arrive today. Reviewed and discussed upcoming appointments: Jory Sims, NP (cardiology) on 04/30/21 to discuss results of echo and need for transesophageal echo Discussed symptoms of Afib. Denies any feeling of palpitations or fluttering. Discussed use of metoprolol succinate 25mg  daily to control heart rate. Patient hasn't experienced any episodes of tachycardia but concerned about bradycardia. She does check her pulse ox and heart rate multiple times a day. HR averaging in the 30s and 40s at rest and 60s with exertion. She does feel a little "washed out" at times but overall Ok.  Reached out to Jory Sims, NP with cardiology office regarding bradychardia. Awaiting response.  Advised patient to continue to monitor heart rate and symptoms and to reach out to cardio or seek emergency medical attention if needed Encouraged to reach out to [facility] as needed   Patient Goals/Self-Care Activities: Patient will self administer medications as prescribed Patient will attend all scheduled provider appointments Patient will continue to perform ADL's independently Patient will call provider office for new concerns or questions      Plan:Telephone follow up appointment with care management team member scheduled for:  05/14/21 with RNCM and The patient has been provided with contact information for the care management team and has been advised to call with any health related questions or concerns.   Chong Sicilian, BSN, RN-BC Embedded Chronic Care Manager [facility] / [place] [facility] Direct Dial: [phone]

## 2021-04-20 ENCOUNTER — Telehealth: Payer: Medicare Other

## 2021-04-22 ENCOUNTER — Ambulatory Visit: Payer: Medicare Other | Admitting: *Deleted

## 2021-04-22 DIAGNOSIS — I4891 Unspecified atrial fibrillation: Secondary | ICD-10-CM

## 2021-04-22 DIAGNOSIS — I10 Essential (primary) hypertension: Secondary | ICD-10-CM

## 2021-04-22 NOTE — Chronic Care Management (AMB) (Signed)
Chronic Care Management   CCM RN Visit Note  04/22/2021 Name: [name] [name] MRN: [id number] DOB: [birth year]  Subjective: [name] [name] is a 80 y.o. year old female who is a primary care patient of Loman Brooklyn, FNP. The care management team was consulted for assistance with disease management and care coordination needs.    Engaged with patient by telephone for follow up visit in response to provider referral for case management and/or care coordination services.   Consent to Services:  The patient was given information about Chronic Care Management services, agreed to services, and gave verbal consent prior to initiation of services.  Please see initial visit note for detailed documentation.   Patient agreed to services and verbal consent obtained.   Assessment: Review of patient past medical history, allergies, medications, health status, including review of consultants reports, laboratory and other test data, was performed as part of comprehensive evaluation and provision of chronic care management services.   SDOH (Social Determinants of Health) assessments and interventions performed:    CCM Care Plan  No Known Allergies  Outpatient Encounter Medications as of 04/22/2021  Medication Sig   acetaminophen (TYLENOL) 325 MG tablet Take 2 tablets (650 mg total) by mouth every 6 (six) hours as needed for mild pain or headache (or Fever >/= 101).   allopurinol (ZYLOPRIM) 100 MG tablet Take 1 tablet (100 mg total) by mouth daily.   apixaban (ELIQUIS) 5 MG TABS tablet Take 1 tablet (5 mg total) by mouth 2 (two) times daily.   atorvastatin (LIPITOR) 20 MG tablet Take 1 tablet (20 mg total) by mouth daily.   Cholecalciferol (VITAMIN D3) 1.25 MG (50000 UT) CAPS Take 5,000 Units by mouth daily.   Cyanocobalamin (VITAMIN B-12) 5000 MCG TBDP Take 5,000 mcg by mouth daily.   diclofenac Sodium (VOLTAREN) 1 % GEL APPLY 4 GRAMS TOPICALLY 4  TIMES DAILY   EUTHYROX 50 MCG tablet TAKE 1  TABLET BY MOUTH ONCE DAILY BEFORE BREAKFAST   furosemide (LASIX) 40 MG tablet Take 1 tablet (40 mg total) by mouth daily.   GNP CRANBERRY EXTRACT PO Take by mouth.   loratadine (CLARITIN) 10 MG tablet Take 10 mg by mouth daily.   metoprolol succinate (TOPROL-XL) 25 MG 24 hr tablet Take 1 tablet (25 mg total) by mouth daily.   ONE TOUCH ULTRA TEST test strip USE UP TO FOUR TIMES DAILY AS DIRECTED   oxybutynin (DITROPAN-XL) 10 MG 24 hr tablet Take 1 tablet (10 mg total) by mouth at bedtime.   pantoprazole (PROTONIX) 40 MG tablet Take 1 tablet (40 mg total) by mouth 2 (two) times daily before a meal.   No facility-administered encounter medications on file as of 04/22/2021.    Patient Active Problem List   Diagnosis Date Noted   Nonrheumatic tricuspid valve regurgitation 02/24/2021   Acute pulmonary edema (HCC)    Acute combined systolic and diastolic heart failure ([place])    Nonrheumatic mitral valve regurgitation    Severe systolic congestive heart failure ([place]) 02/07/2021   Rapid atrial fibrillation ([place]) 02/07/2021   Atrial fibrillation with RVR ([place]) 02/04/2021   Educated about COVID-19 virus infection 02/02/2020   Iron deficiency anemia due to chronic blood loss    Occult blood in stools    Unprovoked DVT and Unprovoked Pulmonary Embolism -Dxed 03/2019 04/03/2019   Sciatica of right side 10/18/2018   Seborrheic keratosis 10/17/2016   Hyperlipidemia associated with type 2 diabetes mellitus ([place]) 10/17/2016   Overactive bladder 10/17/2016  Gastroesophageal reflux disease without esophagitis 10/17/2016   Primary osteoarthritis involving multiple joints 10/17/2016   Chronic gout without tophus 10/17/2016   Type 2 diabetes mellitus without complication, without long-term current use of insulin ([place]) 05/16/2016   Essential hypertension 05/16/2016   Hypothyroidism 05/16/2016   Anemia 10/30/2013   Morbid obesity ([place]) 10/30/2013   S/P right THA, AA 10/29/2013    Conditions to be  addressed/monitored:Atrial Fibrillation, HTN, and DMII  Care Plan : [place]  Updates made by Ilean China, RN since 04/22/2021 12:00 AM     Problem: Chronic Disease Management Needs (DM, HTN, HLD, Afib, chronic anticoagulation)   Priority: Medium     Long-Range Goal: Work with [place] Coordination Regarding Chronic Medical Conditions   Start Date: 02/02/2021  This Visit's Progress: On track  Recent Progress: On track  Priority: Medium  Note:   Current Barriers:  Chronic Disease Management support and education needs related to Atrial Fibrillation, HTN, HLD, and DMII  RNCM Clinical Goal(s):  Patient will work with RN Case Manager to address needs related to Atrial Fibrillation, HTN, HLD, and DMII and recent hospitalization for septic shock secondary to kidney stone and UTI demonstrate ongoing self health care management ability through collaboration with RN Care manager, provider, and care team.   Interventions: 1:1 collaboration with Loman Brooklyn, FNP regarding development and update of comprehensive plan of care as evidenced by provider attestation and co-signature  Inter-disciplinary care team collaboration (see longitudinal plan of care) Reviewed chart including relevant office notes, lab results, and consultation reports   Diabetes:  (Status: Condition stable. Not addressed this visit.) Lab Results  Component Value Date   HGBA1C 5.7 (H) 12/25/2020   HGBA1C 5.7 10/20/2020   HGBA1C 5.4 06/04/2020   Lab Results  Component Value Date   LDLCALC 66 02/07/2021   CREATININE 1.50 (H) 02/24/2021  Assessed patient's understanding of A1c goal: <7% Reviewed and discussed most recent A1C Reviewed medications with patient and discussed importance of medication adherence; Counseled on importance of regular laboratory monitoring as prescribed; Discussed plans with patient for ongoing care management follow up and provided patient with direct contact  information for care management team; Reviewed scheduled/upcoming provider appointments including: Lottie Dawson, PharmD on 02/12/21 for prescription assistance; Encouraged to continue to follow an ADA/carb modified diet  Hypertension: (Status: Condition stable. Not addressed this visit.) Last practice recorded BP readings:  BP Readings from Last 3 Encounters:  03/23/21 140/68  03/05/21 138/70  02/24/21 (!) 141/79   Evaluation of current treatment plan related to hypertension self management and patient's adherence to plan as established by provider; Reviewed medications with patient and discussed importance of compliance; Discussed plans with patient for ongoing care management follow up and provided patient with direct contact information for care management team; Advised patient, providing education and rationale, to monitor blood pressure daily and record, calling PCP for findings outside established parameters;  Reviewed scheduled/upcoming provider appointments including:  04/03/21 with PCP  Reviewed and discussed recent office visit with cardiologist  Questioned mobility and physical activity level. Both are improving. Physical and occupational therapy have ended. Continues to use a walker from time to time but is mostly ambulating on her own without difficulty. She is able to perform ADLs independently.  Discussed oxygen use. Patient is not using oxygen and is waiting for supplier to pick it up. O2 levels have been normal during the day and night.   HDL:  (Status: Goal on track: YES. No  changes this visit.) Evaluation of current treatment plan related to HLD self-management and patient's adherence to plan as established by provider. Discussed plans with patient for ongoing care management follow up and provided patient with direct contact information for care management team Reviewed and discussed recent lab results Reviewed and discussed medications and compliance Encouraged patient to  continue increasing physical activity level as tolerated  A-fib:  (Status: Goal on track: YES.) Afib action plan reviewed Assessed social determinant of health barriers Discussed prescription assistance approval for Eliquis. Approved until 07/24/21. Received shipment. Reviewed and discussed upcoming appointments: Jory Sims, NP (cardiology) on 04/30/21 to discuss results of echo and need for transesophageal echo Discussed symptoms of Afib. Denies any feeling of palpitations or fluttering. Discussed use of metoprolol succinate 25mg  daily to control heart rate. Patient hasn't experienced any episodes of tachycardia but concerned about bradycardia. She does check her pulse ox and heart rate multiple times a day. HR was averaging in the 30s and 40s at rest and 60s with exertion. She does feel a little "washed out" at times but overall Ok. Since our last visit her heart rate has been mostly in the 40s and 50s. I am curious about how low it drops at night but patient does not have a way to monitor that. We discussed this during our last call and she has been checking it as soon as she wakes up and it's normally in the 40s. Collaborated with Jory Sims, NP with cardiology office regarding bradychardia. Per Ms Purcell Nails, patient will likely need medication adjustments and she will do an  EKG at her visit on 04/30/21 to rule out heart block as a contributing cause. Patient made aware of NP recommendation. Advised patient to continue to monitor heart rate and symptoms and to reach out to cardio or seek emergency medical attention if needed Encouraged to reach out to [facility] as needed   Patient Goals/Self-Care Activities: Patient will self administer medications as prescribed Patient will attend all scheduled provider appointments Patient will continue to perform ADL's independently Patient will call provider office for new concerns or questions      Plan:Telephone follow up appointment with care  management team member scheduled for:  05/14/21 with RNCM and The patient has been provided with contact information for the care management team and has been advised to call with any health related questions or concerns.   Chong Sicilian, BSN, RN-BC Embedded Chronic Care Manager [facility] / [place] [facility] Direct Dial: [phone]

## 2021-04-22 NOTE — Patient Instructions (Signed)
Visit Information  PATIENT GOALS:  Goals Addressed             This Visit's Progress    Track and Manage Heart Rate and Rhythm-Atrial Fibrillation   On track    Timeframe:  Long-Range Goal Priority:  High Start Date:   02/04/21                          Expected End Date:    02/04/22                   Follow Up Date: 05/14/21 with RNCM for routine f/u. Will follow-up sooner after hearing from cardiology re bradycardia   Keep a symptoms diary Check and record pulse rate daily and as needed Keep all medical appointments Take medications as prescribed Call PCP [phone] or cardiologist [phone] with any new or worsening symptoms Seek appropriate medical attention for any new or worsening symptoms Call [facility] as needed [phone]    Why is this important?   Atrial fibrillation may have no symptoms. Sometimes the symptoms get worse or happen more often.  It is important to keep track of what your symptoms are and when they happen.  A change in symptoms is important to discuss with your doctor or nurse.  Being active and healthy eating will also help you manage your heart condition.     Notes:         Patient verbalizes understanding of instructions provided today and agrees to view in [place].   Plan:Telephone follow up appointment with care management team member scheduled for:  05/14/21 with RNCM and The patient has been provided with contact information for the care management team and has been advised to call with any health related questions or concerns.   Chong Sicilian, BSN, RN-BC Embedded Chronic Care Manager [facility] / [place] [facility] Direct Dial: [phone]

## 2021-04-23 ENCOUNTER — Ambulatory Visit: Payer: Medicare Other | Admitting: Family Medicine

## 2021-04-23 DIAGNOSIS — I10 Essential (primary) hypertension: Secondary | ICD-10-CM

## 2021-04-23 DIAGNOSIS — I4891 Unspecified atrial fibrillation: Secondary | ICD-10-CM | POA: Diagnosis not present

## 2021-04-29 NOTE — Progress Notes (Signed)
Cardiology Office Note   Date:  04/30/2021   ID:  [name], [name] [birth year], MRN [id number]  PCP:  [name] Brooklyn, FNP  Cardiologist: Dr. Percival Spanish CC: Follow Up Echo Severe Mitral Valve Regurgitation    History of Present Illness: [name] [name] is a 80 y.o. female who presents for ongoing assessment and management of chronic systolic CHF, history of persistent atrial fibrillation with CHADS  VASC Score of 6 on Eliquis, hypertension, and DVT/PE.  She has other history which includes type 2 diabetes, obesity, GERD, hypothyroidism, and chronic anemia.  Repeat echocardiogram on 02/07/21 revealed 35% to 40%, LV global hypokinesis, average LV global longitudinal strain is -8.2%, RV systolic function mildly reduced, RV size mildly enlarged, moderate elevated PASP with estimated RVSP 51.5 mmHg, severely dilated LA, moderate dilated RA, severe MR moderate TR, mild aortic sclerosis. (EF reduced from 60 to 65%, severe MR and moderate TR, RVSP 51 are new 04/03/2019).  She was diuresed with IV lasix and transitioned to po lasix 40 mg daily. It was felt that the etiology of her decompensated CHF was related to tachycardia induced CM and underlying valvular heart disease. Potassium supplement was discontinued due to hyperkalemia.    She was found to have NSVT and runs of atrial tachycardia which required cardioversion when initially seen in the ED, with conversion to NSR. She was continued on metoprolol.  She was planned for repeat echocardiogram for evaluation of LVEF with follow-up TEE,  if EF continued to be reduced and ischemic evaluation.  She did have a repeat echocardiogram which was completed on 04/01/2021.  She was found to have a normal ejection fraction of 55 to 60% with normal LV function.  It was however found that she had severe mitral valve regurgitation with no evidence of mitral valve stenosis, moderate mitral annular calcification.  PI SA ER O0 0.44 cm, MR radius 1.1 cm and MR  regurgitant volume of 92 cc.  She was recommended for TEE for further assessment of mitral regurgitation.  She is here to discuss this and evaluate her symptoms.  She comes today with some complaints of dizziness on occasion.  She denies chest pain or significant shortness of breath.  She is complaining of pain between her shoulder blades which she describes as gnawing or burrowing.  She is here with her son, [name] [name] who is recording our office visit to help her to remember what is been said.  Past Medical History:  Diagnosis Date   Anemia    Arthritis    Ostearthritis- hips, knees, fingers   Diabetes mellitus without complication (HCC)    DVT (deep venous thrombosis) (HCC)    Dyspnea    GERD (gastroesophageal reflux disease)    Headache(784.0)    tx. Valproic acid   History of kidney stones    Hypertension    Hypothyroidism    Pulmonary embolism [facility])     Past Surgical History:  Procedure Laterality Date   ABDOMINAL HYSTERECTOMY     BILATERAL HIP ARTHROSCOPY Left    BIOPSY  12/08/2019   Procedure: BIOPSY;  Surgeon: Daneil Dolin, MD;  Location: [facility];  Service: Endoscopy;;   CATARACT EXTRACTION, BILATERAL     COLONOSCOPY N/A 12/08/2019   polyps (tubular adenoma), diverticulosis, colonic lipoma, no surveillance due to age   31 W/ [place] Right 12/25/2020   Procedure: CYSTOSCOPY WITH RETROGRADE PYELOGRAM/URETERAL STENT PLACEMENT;  Surgeon: Janith Lima, MD;  Location: [facility];  Service: Urology;  Laterality: Right;  CYSTOSCOPY/URETEROSCOPY/HOLMIUM LASER/STENT PLACEMENT Right 01/15/2021   Procedure: CYSTOSCOPY/RETROGRADE/URETEROSCOPY/HOLMIUM LASER/STENT EXCHANGE;  Surgeon: Janith Lima, MD;  Location: [facility];  Service: Urology;  Laterality: Right;   ESOPHAGOGASTRODUODENOSCOPY N/A 12/08/2019   normal esophagus with 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   GIVENS CAPSULE STUDY N/A 01/13/2020   Procedure: GIVENS  CAPSULE STUDY;  Surgeon: Daneil Dolin, MD;  [facility];  Service: Endoscopy;  Laterality: N/A;  7:30am   MULTIPLE TOOTH EXTRACTIONS     60's   PARATHYROIDECTOMY     POLYPECTOMY  12/08/2019   Procedure: POLYPECTOMY;  Surgeon: Daneil Dolin, MD;  [facility]: [facility];  Service: Endoscopy;;   TOTAL HIP ARTHROPLASTY Right 10/29/2013   Procedure: RIGHT TOTAL HIP ARTHROPLASTY ANTERIOR APPROACH;  Surgeon: Mauri Pole, MD;  Location: [facility];  Service: Orthopedics;  Laterality: Right;     Current Outpatient Medications  Medication Sig Dispense Refill   acetaminophen (TYLENOL) 325 MG tablet Take 2 tablets (650 mg total) by mouth every 6 (six) hours as needed for mild pain or headache (or Fever >/= 101). 12 tablet 0   allopurinol (ZYLOPRIM) 100 MG tablet Take 1 tablet (100 mg total) by mouth daily. 90 tablet 1   apixaban (ELIQUIS) 5 MG TABS tablet Take 1 tablet (5 mg total) by mouth 2 (two) times daily. 90 tablet 3   atorvastatin (LIPITOR) 20 MG tablet Take 1 tablet (20 mg total) by mouth daily. 30 tablet 2   Cholecalciferol (VITAMIN D3) 1.25 MG (50000 UT) CAPS Take 5,000 Units by mouth daily.     Cyanocobalamin (VITAMIN B-12) 5000 MCG TBDP Take 5,000 mcg by mouth daily.     diclofenac Sodium (VOLTAREN) 1 % GEL APPLY 4 GRAMS TOPICALLY 4  TIMES DAILY 300 g 0   EUTHYROX 50 MCG tablet TAKE 1 TABLET BY MOUTH ONCE DAILY BEFORE BREAKFAST 30 tablet 10   furosemide (LASIX) 20 MG tablet Take 1 tablet (20 mg total) by mouth daily. 90 tablet 2   GNP CRANBERRY EXTRACT PO Take by mouth.     loratadine (CLARITIN) 10 MG tablet Take 10 mg by mouth daily.     metoprolol succinate (TOPROL-XL) 25 MG 24 hr tablet Take 1 tablet (25 mg total) by mouth daily. 30 tablet 1   ONE TOUCH ULTRA TEST test strip USE UP TO FOUR TIMES DAILY AS DIRECTED 400 each 3   oxybutynin (DITROPAN-XL) 10 MG 24 hr tablet Take 1 tablet (10 mg total) by mouth at bedtime. 90 tablet 1   pantoprazole (PROTONIX) 40 MG tablet  Take 1 tablet (40 mg total) by mouth 2 (two) times daily before a meal. 180 tablet 1   No current facility-administered medications for this visit.    Allergies:   Patient has no known allergies.    Social History:  The patient  reports that she has never smoked. She has never used smokeless tobacco. She reports that she does not drink alcohol and does not use drugs.   Family History:  The patient's family history includes Cancer (age of onset: 67) in her sister; Cancer (age of onset: 72) in her mother; Colitis (age of onset: 26) in her sister; Healthy in her daughter, son, son, and son; Heart attack (age of onset: 24) in her brother; Heart disease in her brother; Heart disease (age of onset: 44) in her father; Pulmonary embolism (age of onset: 49) in her brother.    ROS: All other systems are reviewed and negative. Unless  otherwise mentioned in H&P    PHYSICAL EXAM: VS:  BP 136/62 (BP Location: Left Arm, Patient Position: Sitting, Cuff Size: Large)   Pulse 65   Ht 5\' 3"  (1.6 m)   Wt 199 lb (90.3 kg)   BMI 35.25 kg/m  , BMI Body mass index is 35.25 kg/m. GEN: Well nourished, well developed, in no acute distress HEENT: normal Neck: no JVD, carotid bruits, or masses Cardiac: RRR; 2/6 regurgitant murmur heard best at the left sternal border without rubs, or gallops,no edema  Respiratory:  Clear to auscultation bilaterally, normal work of breathing GI: soft, nontender, nondistended, + BS MS: no deformity or atrophy Skin: warm and dry, no rash Neuro:  Strength and sensation are intact Psych: euthymic mood, full affect   EKG:  EKG is ordered today. The ekg ordered today demonstrates sinus rhythm with marked sinus arrhythmia, PVCs heart rate of 65 bpm left ventricular hypertrophy is noted.   Recent Labs: 01/05/2021: ALT 17 02/07/2021: TSH 3.605 02/09/2021: Magnesium 1.8 02/24/2021: BUN 33; Creatinine, Ser 1.50; Hemoglobin 9.5; Platelets 219; Potassium 4.8; Sodium 140    Lipid  Panel    Component Value Date/Time   CHOL 108 02/07/2021 0528   CHOL 174 10/20/2020 1017   TRIG 65 02/07/2021 0528   HDL 29 (L) 02/07/2021 0528   HDL 55 10/20/2020 1017   CHOLHDL 3.7 02/07/2021 0528   VLDL 13 02/07/2021 0528   LDLCALC 66 02/07/2021 0528   LDLCALC 90 10/20/2020 1017      Wt Readings from Last 3 Encounters:  04/30/21 199 lb (90.3 kg)  03/23/21 197 lb 9.6 oz (89.6 kg)  03/05/21 199 lb 12.8 oz (90.6 kg)      Other studies Reviewed: Echocardiogram Apr 18, 2021 1. Left ventricular ejection fraction, by estimation, is 55 to 60%. The  left ventricle has normal function. The left ventricle has no regional  wall motion abnormalities. The left ventricular internal cavity size was  mildly dilated. There is mild  asymmetric left ventricular hypertrophy of the septal segment. Left  ventricular diastolic parameters are consistent with Grade III diastolic  dysfunction (restrictive). Elevated left ventricular end-diastolic  pressure.   2. Right ventricular systolic function is normal. The right ventricular  size is mildly enlarged. There is mildly elevated pulmonary artery  systolic pressure. The estimated right ventricular systolic pressure is  90.2 mmHg.   3. Left atrial size was severely dilated.   4. Right atrial size was moderately dilated.   5. The mitral valve is abnormal. Severe mitral valve regurgitation. No  evidence of mitral stenosis. Moderate mitral annular calcification. PISA  ERO 0.44cm2, MR radius 1.1cm and MR regurgitant volume 92cc.   6. The aortic valve is normal in structure. Aortic valve regurgitation is  not visualized. No aortic stenosis is present.   7. Aortic dilatation noted. There is mild dilatation of the aortic root,  measuring 41 mm.   8. The inferior vena cava is normal in size with greater than 50%  respiratory variability, suggesting right atrial pressure of 3 mmHg.   9. Recommend TEE for further assessement of mitral regurgitation.      ASSESSMENT AND PLAN:  1.  Severe mitral valve regurgitation with moderate mitral annular calcification: I have explained to her that the next evaluation will need to be a transesophageal echocardiogram for further evaluation of the extent of her mitral valve regurgitation and need for repair or ongoing medical management.  I reviewed with her and her son the risks and benefits of TEE,  as well as discussion of the procedure.    I have given them an opportunity to ask questions and have not pressuring them to make a decision today.  However, after explanation and discussion between the patient and her son, with shared decision making, they have elected to proceed with TEE.  Understand the risks and are willing to proceed.    This is scheduled on October 14 at 8 AM with Dr. Stanford Breed.  Lasix will be held that morning.  She will have preprocedure labs with a BMET and a CBC.  2.  Dizziness with episodes of hypotension: I will decrease her Lasix from 40 mg daily to 20 mg daily to allow for her to have some normalization in her blood pressure.  No other changes will be made in her medication regimen.  She will continue on metoprolol as directed to allow for better cardiac output and lower heart rate in the setting of her mitral valve disease.  Review of her most recent echo demonstrated normal LVEF and therefore I do not believe higher doses of diuretics are warranted at this time.  She offers no complaints of lower extremity edema.  3.  History of atrial fibrillation with RVR: She remains in sinus rhythm with sinus arrhythmia, heart rate is well controlled currently.  She will continue on apixaban 5 mg twice daily.  4.  History of systolic heart failure: Patient does not have evidence of volume overload, recent echocardiogram reveals normal LVEF.  Reducing Lasix from 40 mg daily to 20 mg daily.  Blood pressure well controlled.   Current medicines are reviewed at length with the patient today.  I have spent  45 min's  dedicated to the care of this patient on the date of this encounter to include pre-visit review of records, assessment, management and diagnostic testing,with shared decision making.  Labs/ tests ordered today include: BMET and CBC.  Phill Myron. West Pugh, ANP, AACC   04/30/2021 12:50 PM    [facility] [place] [address] Office [phone] Fax [phone]  Notice: This dictation was prepared with Dragon dictation along with smaller phrase technology. Any transcriptional errors that result from this process are unintentional and may not be corrected upon review.

## 2021-04-30 ENCOUNTER — Ambulatory Visit (INDEPENDENT_AMBULATORY_CARE_PROVIDER_SITE_OTHER): Payer: Medicare Other | Admitting: Adult Health

## 2021-04-30 ENCOUNTER — Other Ambulatory Visit: Payer: Self-pay

## 2021-04-30 ENCOUNTER — Encounter: Payer: Self-pay | Admitting: Adult Health

## 2021-04-30 ENCOUNTER — Encounter (HOSPITAL_COMMUNITY): Payer: Self-pay | Admitting: Cardiology

## 2021-04-30 VITALS — BP 136/62 | HR 65 | Ht 63.0 in | Wt 199.0 lb

## 2021-04-30 DIAGNOSIS — I340 Nonrheumatic mitral (valve) insufficiency: Secondary | ICD-10-CM

## 2021-04-30 DIAGNOSIS — I10 Essential (primary) hypertension: Secondary | ICD-10-CM | POA: Diagnosis not present

## 2021-04-30 DIAGNOSIS — I4891 Unspecified atrial fibrillation: Secondary | ICD-10-CM | POA: Diagnosis not present

## 2021-04-30 DIAGNOSIS — R42 Dizziness and giddiness: Secondary | ICD-10-CM

## 2021-04-30 DIAGNOSIS — I480 Paroxysmal atrial fibrillation: Secondary | ICD-10-CM | POA: Diagnosis not present

## 2021-04-30 LAB — CBC
Hematocrit: 22.2 % — ABNORMAL LOW (ref 34.0–46.6)
Hemoglobin: 7 g/dL — CL (ref 11.1–15.9)
MCH: 27.7 pg (ref 26.6–33.0)
MCHC: 31.5 g/dL (ref 31.5–35.7)
MCV: 88 fL (ref 79–97)
Platelets: 228 10*3/uL (ref 150–450)
RBC: 2.53 x10E6/uL — CL (ref 3.77–5.28)
RDW: 15 % (ref 11.7–15.4)
WBC: 4.6 10*3/uL (ref 3.4–10.8)

## 2021-04-30 LAB — BASIC METABOLIC PANEL
BUN/Creatinine Ratio: 22 (ref 12–28)
BUN: 28 mg/dL — ABNORMAL HIGH (ref 8–27)
CO2: 24 mmol/L (ref 20–29)
Calcium: 10.8 mg/dL — ABNORMAL HIGH (ref 8.7–10.3)
Chloride: 104 mmol/L (ref 96–106)
Creatinine, Ser: 1.3 mg/dL — ABNORMAL HIGH (ref 0.57–1.00)
Glucose: 110 mg/dL — ABNORMAL HIGH (ref 70–99)
Potassium: 4.3 mmol/L (ref 3.5–5.2)
Sodium: 141 mmol/L (ref 134–144)
eGFR: 42 mL/min/{1.73_m2} — ABNORMAL LOW (ref >59)

## 2021-04-30 MED ORDER — FUROSEMIDE 20 MG PO TABS: 20.0000 mg | ORAL_TABLET | Freq: Every day | ORAL | 2 refills | Status: DC

## 2021-04-30 NOTE — Patient Instructions (Addendum)
Medication Instructions:  Decrease Lasix (From 40 mg to 20 mg Daily.) *If you need a refill on your cardiac medications before your next appointment, please call your pharmacy*   Lab Work: BMET, CBC. Today If you have labs (blood work) drawn today and your tests are completely normal, you will receive your results only by: [place] (if you have MyChart) OR A paper copy in the mail If you have any lab test that is abnormal or we need to change your treatment, we will call you to review the results.   Testing/Procedures: Your physician has requested that you have a TEE. During a TEE, sound waves are used to create images of your heart. It provides your doctor with information about the size and shape of your heart and how well your heart's chambers and valves are working. In this test, a transducer is attached to the end of a flexible tube that's guided down your throat and into your esophagus (the tube leading from you mouth to your stomach) to get a more detailed image of your heart. You are not awake for the procedure. Please see the instruction sheet given to you today. For further information please visit [email or web address].     Follow-Up: At [facility], you and your health needs are our priority.  As part of our continuing mission to provide you with exceptional heart care, we have created designated Provider Care Teams.  These Care Teams include your primary Cardiologist (physician) and Advanced Practice Providers (APPs -  Physician Assistants and Nurse Practitioners) who all work together to provide you with the care you need, when you need it.    Your next appointment:   1 month(s)  The format for your next appointment:   In Person  Provider:   You may see Minus Breeding, MD or one of the following Advanced Practice Providers on your designated Care Team:   Rosaria Ferries, PA-C Caron Presume, PA-C Jory Sims, DNP, ANP   Dear [name] [name] are  scheduled for a TEE on May 07, 2021  with Dr. Kirk Ruths.  Please arrive at the [facility] (Main Entrance A) at [facility]: [address] [place], [place] [address] at 7 am (1 hour prior to procedure unless lab work is needed; if lab work is needed arrive 1.5 hours ahead)  DIET: Nothing to eat or drink after midnight except a sip of water with medications (see medication instructions below)  FYI: For your safety, and to allow [place] to monitor your vital signs accurately during the surgery/procedure we request that   if you have artificial nails, gel coating, SNS etc. Please have those removed prior to your surgery/procedure. Not having the nail coverings /polish removed may result in cancellation or delay of your surgery/procedure.   Medication Instructions: Hold Lasix  Continue your anticoagulant: Eliquis You will need to continue your anticoagulant after your procedure until you  are told by your  Provider that it is safe to stop   Labs: If patient is on Coumadin, patient needs pt/INR, CBC, BMET within 3 days (No pt/INR needed for patients taking Xarelto, Eliquis, Pradaxa) For patients receiving anesthesia for TEE and all Cardioversion patients: BMET, CBC within 1 week  Come to: [place], [address] (Lab option #1) Come to the lab at [facility] between the hours of 8:00 am and 4:30 pm. You do not have to be fasting. (Lab option #2) Lab at an alternate location (Lab option #  3) your lab work will be done at the hospital prior to your procedure - you will need to arrive 1  hours ahead of your procedure  You must have a responsible person to drive you home and stay in the waiting area during your procedure. Failure to do so could result in cancellation.  Bring your insurance cards.  *Special Note: Every effort is made to have your procedure done on time. Occasionally there are emergencies that occur at the hospital that may cause delays. Please  be patient if a delay does occur.

## 2021-05-01 ENCOUNTER — Encounter (HOSPITAL_COMMUNITY): Payer: Self-pay

## 2021-05-01 ENCOUNTER — Telehealth: Payer: Self-pay | Admitting: Adult Health

## 2021-05-01 ENCOUNTER — Inpatient Hospital Stay (HOSPITAL_COMMUNITY)
Admission: EM | Admit: 2021-05-01 | Discharge: 2021-05-04 | DRG: 378 | Disposition: A | Discharge status: Home / Self Care | Payer: Medicare Other | Attending: Internal Medicine | Admitting: Internal Medicine

## 2021-05-01 ENCOUNTER — Other Ambulatory Visit: Payer: Self-pay

## 2021-05-01 DIAGNOSIS — Z87442 Personal history of urinary calculi: Secondary | ICD-10-CM | POA: Diagnosis not present

## 2021-05-01 DIAGNOSIS — Z9841 Cataract extraction status, right eye: Secondary | ICD-10-CM

## 2021-05-01 DIAGNOSIS — D649 Anemia, unspecified: Secondary | ICD-10-CM | POA: Diagnosis present

## 2021-05-01 DIAGNOSIS — K922 Gastrointestinal hemorrhage, unspecified: Principal | ICD-10-CM | POA: Diagnosis present

## 2021-05-01 DIAGNOSIS — D62 Acute posthemorrhagic anemia: Secondary | ICD-10-CM | POA: Diagnosis not present

## 2021-05-01 DIAGNOSIS — I2699 Other pulmonary embolism without acute cor pulmonale: Secondary | ICD-10-CM | POA: Diagnosis present

## 2021-05-01 DIAGNOSIS — I4819 Other persistent atrial fibrillation: Secondary | ICD-10-CM | POA: Diagnosis present

## 2021-05-01 DIAGNOSIS — M109 Gout, unspecified: Secondary | ICD-10-CM | POA: Diagnosis not present

## 2021-05-01 DIAGNOSIS — Z96641 Presence of right artificial hip joint: Secondary | ICD-10-CM | POA: Diagnosis present

## 2021-05-01 DIAGNOSIS — K219 Gastro-esophageal reflux disease without esophagitis: Secondary | ICD-10-CM | POA: Diagnosis not present

## 2021-05-01 DIAGNOSIS — E039 Hypothyroidism, unspecified: Secondary | ICD-10-CM | POA: Diagnosis not present

## 2021-05-01 DIAGNOSIS — E669 Obesity, unspecified: Secondary | ICD-10-CM | POA: Diagnosis present

## 2021-05-01 DIAGNOSIS — Z86711 Personal history of pulmonary embolism: Secondary | ICD-10-CM | POA: Diagnosis not present

## 2021-05-01 DIAGNOSIS — I10 Essential (primary) hypertension: Secondary | ICD-10-CM | POA: Diagnosis present

## 2021-05-01 DIAGNOSIS — E1169 Type 2 diabetes mellitus with other specified complication: Secondary | ICD-10-CM | POA: Diagnosis present

## 2021-05-01 DIAGNOSIS — I5032 Chronic diastolic (congestive) heart failure: Secondary | ICD-10-CM | POA: Diagnosis present

## 2021-05-01 DIAGNOSIS — I340 Nonrheumatic mitral (valve) insufficiency: Secondary | ICD-10-CM | POA: Diagnosis present

## 2021-05-01 DIAGNOSIS — I130 Hypertensive heart and chronic kidney disease with heart failure and stage 1 through stage 4 chronic kidney disease, or unspecified chronic kidney disease: Secondary | ICD-10-CM | POA: Diagnosis present

## 2021-05-01 DIAGNOSIS — I152 Hypertension secondary to endocrine disorders: Secondary | ICD-10-CM | POA: Diagnosis present

## 2021-05-01 DIAGNOSIS — D500 Iron deficiency anemia secondary to blood loss (chronic): Secondary | ICD-10-CM | POA: Diagnosis not present

## 2021-05-01 DIAGNOSIS — N1832 Chronic kidney disease, stage 3b: Secondary | ICD-10-CM | POA: Diagnosis not present

## 2021-05-01 DIAGNOSIS — Z800 Family history of malignant neoplasm of digestive organs: Secondary | ICD-10-CM

## 2021-05-01 DIAGNOSIS — Z86718 Personal history of other venous thrombosis and embolism: Secondary | ICD-10-CM

## 2021-05-01 DIAGNOSIS — Z8249 Family history of ischemic heart disease and other diseases of the circulatory system: Secondary | ICD-10-CM

## 2021-05-01 DIAGNOSIS — Z79899 Other long term (current) drug therapy: Secondary | ICD-10-CM

## 2021-05-01 DIAGNOSIS — E785 Hyperlipidemia, unspecified: Secondary | ICD-10-CM | POA: Diagnosis not present

## 2021-05-01 DIAGNOSIS — Z7901 Long term (current) use of anticoagulants: Secondary | ICD-10-CM

## 2021-05-01 DIAGNOSIS — Z6835 Body mass index (BMI) 35.0-35.9, adult: Secondary | ICD-10-CM

## 2021-05-01 DIAGNOSIS — Z90710 Acquired absence of both cervix and uterus: Secondary | ICD-10-CM | POA: Diagnosis not present

## 2021-05-01 DIAGNOSIS — I5041 Acute combined systolic (congestive) and diastolic (congestive) heart failure: Secondary | ICD-10-CM | POA: Diagnosis present

## 2021-05-01 DIAGNOSIS — Z20822 Contact with and (suspected) exposure to covid-19: Secondary | ICD-10-CM | POA: Diagnosis not present

## 2021-05-01 DIAGNOSIS — Z86010 Personal history of colonic polyps: Secondary | ICD-10-CM

## 2021-05-01 DIAGNOSIS — K921 Melena: Secondary | ICD-10-CM | POA: Diagnosis not present

## 2021-05-01 DIAGNOSIS — Z9842 Cataract extraction status, left eye: Secondary | ICD-10-CM | POA: Diagnosis not present

## 2021-05-01 DIAGNOSIS — D509 Iron deficiency anemia, unspecified: Secondary | ICD-10-CM | POA: Diagnosis present

## 2021-05-01 DIAGNOSIS — I5043 Acute on chronic combined systolic (congestive) and diastolic (congestive) heart failure: Secondary | ICD-10-CM | POA: Diagnosis present

## 2021-05-01 LAB — IRON AND TIBC
Iron: 22 ug/dL — ABNORMAL LOW (ref 28–170)
Saturation Ratios: 5 % — ABNORMAL LOW (ref 10.4–31.8)
TIBC: 439 ug/dL (ref 250–450)
UIBC: 417 ug/dL

## 2021-05-01 LAB — BASIC METABOLIC PANEL
Anion gap: 8 (ref 5–15)
BUN: 29 mg/dL — ABNORMAL HIGH (ref 8–23)
CO2: 25 mmol/L (ref 22–32)
Calcium: 11.1 mg/dL — ABNORMAL HIGH (ref 8.9–10.3)
Chloride: 106 mmol/L (ref 98–111)
Creatinine, Ser: 1.49 mg/dL — ABNORMAL HIGH (ref 0.44–1.00)
GFR, Estimated: 36 mL/min — ABNORMAL LOW (ref >60)
Glucose, Bld: 113 mg/dL — ABNORMAL HIGH (ref 70–99)
Potassium: 4.1 mmol/L (ref 3.5–5.1)
Sodium: 139 mmol/L (ref 135–145)

## 2021-05-01 LAB — CBC WITH DIFFERENTIAL/PLATELET
Abs Immature Granulocytes: 0.02 10*3/uL (ref 0.00–0.07)
Basophils Absolute: 0 10*3/uL (ref 0.0–0.1)
Basophils Relative: 1 %
Eosinophils Absolute: 0.1 10*3/uL (ref 0.0–0.5)
Eosinophils Relative: 2 %
HCT: 24 % — ABNORMAL LOW (ref 36.0–46.0)
Hemoglobin: 7.3 g/dL — ABNORMAL LOW (ref 12.0–15.0)
Immature Granulocytes: 0 %
Lymphocytes Relative: 29 %
Lymphs Abs: 1.4 10*3/uL (ref 0.7–4.0)
MCH: 27.7 pg (ref 26.0–34.0)
MCHC: 30.4 g/dL (ref 30.0–36.0)
MCV: 90.9 fL (ref 80.0–100.0)
Monocytes Absolute: 0.5 10*3/uL (ref 0.1–1.0)
Monocytes Relative: 10 %
Neutro Abs: 2.8 10*3/uL (ref 1.7–7.7)
Neutrophils Relative %: 58 %
Platelets: 249 10*3/uL (ref 150–400)
RBC: 2.64 MIL/uL — ABNORMAL LOW (ref 3.87–5.11)
RDW: 16 % — ABNORMAL HIGH (ref 11.5–15.5)
WBC: 4.9 10*3/uL (ref 4.0–10.5)
nRBC: 0 % (ref 0.0–0.2)

## 2021-05-01 LAB — POC OCCULT BLOOD, ED: Fecal Occult Bld: NEGATIVE

## 2021-05-01 LAB — VITAMIN B12: Vitamin B-12: 6739 pg/mL — ABNORMAL HIGH (ref 180–914)

## 2021-05-01 LAB — PREPARE RBC (CROSSMATCH)

## 2021-05-01 LAB — RESP PANEL BY RT-PCR (FLU A&B, COVID) ARPGX2
Influenza A by PCR: NEGATIVE
Influenza B by PCR: NEGATIVE
SARS Coronavirus 2 by RT PCR: NEGATIVE

## 2021-05-01 LAB — FOLATE: Folate: 18.3 ng/mL (ref >5.9)

## 2021-05-01 LAB — FERRITIN: Ferritin: 6 ng/mL — ABNORMAL LOW (ref 11–307)

## 2021-05-01 LAB — GLUCOSE, CAPILLARY: Glucose-Capillary: 92 mg/dL (ref 70–99)

## 2021-05-01 LAB — RETICULOCYTES
Immature Retic Fract: 21.2 % — ABNORMAL HIGH (ref 2.3–15.9)
RBC.: 2.67 MIL/uL — ABNORMAL LOW (ref 3.87–5.11)
Retic Count, Absolute: 77.7 10*3/uL (ref 19.0–186.0)
Retic Ct Pct: 2.9 % (ref 0.4–3.1)

## 2021-05-01 MED ORDER — INSULIN ASPART 100 UNIT/ML IJ SOLN
0.0000 [IU] | Freq: Three times a day (TID) | INTRAMUSCULAR | Status: DC
Start: 2021-05-02 — End: 2021-05-04

## 2021-05-01 MED ORDER — ONDANSETRON HCL 4 MG/2ML IJ SOLN: 4.0000 mg | Freq: Four times a day (QID) | INTRAMUSCULAR | Status: DC | PRN

## 2021-05-01 MED ORDER — INSULIN ASPART 100 UNIT/ML IJ SOLN: 0.0000 [IU] | Freq: Every day | INTRAMUSCULAR | Status: DC

## 2021-05-01 MED ORDER — SODIUM CHLORIDE 0.9 % IV SOLN
10.0000 mL/h | Freq: Once | INTRAVENOUS | Status: AC
  Administered 2021-05-01: 10 mL/h via INTRAVENOUS

## 2021-05-01 MED ORDER — ONDANSETRON HCL 4 MG PO TABS: 4.0000 mg | ORAL_TABLET | Freq: Four times a day (QID) | ORAL | Status: DC | PRN

## 2021-05-01 NOTE — ED Provider Notes (Signed)
Emergency Medicine Provider Triage Evaluation Note  [name] [name] , a 80 y.o. female  was evaluated in triage.  Pt complains of anemia.  Review of Systems  Positive: Mild weakness Negative: Dizzy, lighthead, cp, sob, abd pain, dark stool  Physical Exam  BP 140/74 (BP Location: Right Arm)   Pulse (!) 53   Temp 98.2 F (36.8 C) (Oral)   Resp 18   Ht 5\' 3"  (1.6 m)   Wt 88 kg   SpO2 98%   BMI 34.37 kg/m  Gen:   Awake, no distress   Resp:  Normal effort  MSK:   Moves extremities without difficulty  Other:    Medical Decision Making  Medically screening exam initiated at 2:53 PM.  Appropriate orders placed.  [name] [name] was informed that the remainder of the evaluation will be completed by another provider, this initial triage assessment does not replace that evaluation, and the importance of remaining in the ED until their evaluation is complete.  Pt went to her cardiologist yesterday for evaluation of her heart valve, was notified today that her hgb is 7.  Has hx of anemia requiring blood transfusion in the past.  Unsure cause of hematuria per pt.    [name] Moras, PA-C 05/01/21 1454    [name] [name], [name] Olds, MD 05/03/21 [phone]

## 2021-05-01 NOTE — ED Provider Notes (Signed)
[facility] DEPT Provider Note   CSN: [id number] Arrival date & time: 05/01/21  1424     History Chief Complaint  Patient presents with   Abnormal Lab    [name] [name] is a 80 y.o. female.  HPI [name] [name] is a 80 y.o. female with a h/o chronic systolic CHF, history of persistent atrial fibrillation with CHADS  VASC Score of 6 on Eliquis, hypertension, and DVT/PE.  She presents to the emergency department due to abnormal lab values.  Patient states she has been evaluated yesterday by her cardiologist and has an upcoming TEE.  She states that her labs were obtained at this visit yesterday and she was found to have a hemoglobin of 7 and was told to come to the emergency department for a transfusion.  Patient states that she has felt mildly weak. Denies any chest pain, shortness of breath, syncope, dizziness, abdominal pain, melena, rectal bleeding, hematuria.  Patient does note that she has a history of anemia and has required a blood transfusion last year.  She was previously placed on ferrous sulfate which she was taking and at that time was having dark stools but was found to have an elevated iron level so this was discontinued about 3 weeks ago and her dark stools have since resolved.  Of note, patient states that she has a history of unprovoked DVT/PE and was placed on 5 mg of Eliquis twice per day initially.  After her she was diagnosed with symptomatic anemia during admission in 2021 this dose was halved to 2.5 mg twice per day.  Earlier this year she was started back on 5 mg twice per day.    Past Medical History:  Diagnosis Date   Anemia    Arthritis    Ostearthritis- hips, knees, fingers   Diabetes mellitus without complication (HCC)    DVT (deep venous thrombosis) (HCC)    Dyspnea    GERD (gastroesophageal reflux disease)    Headache(784.0)    tx. Valproic acid   History of kidney stones    Hypertension    Hypothyroidism     Pulmonary embolism [facility])     Patient Active Problem List   Diagnosis Date Noted   Nonrheumatic tricuspid valve regurgitation 02/24/2021   Acute pulmonary edema (HCC)    Acute combined systolic and diastolic heart failure ([place])    Nonrheumatic mitral valve regurgitation    Severe systolic congestive heart failure ([place]) 02/07/2021   Rapid atrial fibrillation ([place]) 02/07/2021   Atrial fibrillation with RVR ([place]) 02/04/2021   Educated about COVID-19 virus infection 02/02/2020   Iron deficiency anemia due to chronic blood loss    Occult blood in stools    Unprovoked DVT and Unprovoked Pulmonary Embolism -Dxed 03/2019 04/03/2019   Sciatica of right side 10/18/2018   Seborrheic keratosis 10/17/2016   Hyperlipidemia associated with type 2 diabetes mellitus ([place]) 10/17/2016   Overactive bladder 10/17/2016   Gastroesophageal reflux disease without esophagitis 10/17/2016   Primary osteoarthritis involving multiple joints 10/17/2016   Chronic gout without tophus 10/17/2016   Type 2 diabetes mellitus without complication, without long-term current use of insulin ([place]) 05/16/2016   Essential hypertension 05/16/2016   Hypothyroidism 05/16/2016   Anemia 10/30/2013   Morbid obesity ([place]) 10/30/2013   S/P right THA, AA 10/29/2013    Past Surgical History:  Procedure Laterality Date   ABDOMINAL HYSTERECTOMY     BILATERAL HIP ARTHROSCOPY Left    BIOPSY  12/08/2019   Procedure: BIOPSY;  Surgeon: Daneil Dolin, MD;  Location: [facility];  Service: Endoscopy;;   CATARACT EXTRACTION, BILATERAL     COLONOSCOPY N/A 12/08/2019   polyps (tubular adenoma), diverticulosis, colonic lipoma, no surveillance due to age   26 W/ [place] Right 12/25/2020   Procedure: CYSTOSCOPY WITH RETROGRADE PYELOGRAM/URETERAL STENT PLACEMENT;  Surgeon: Janith Lima, MD;  Location: [facility] ORS;  Service: Urology;  Laterality: Right;   CYSTOSCOPY/URETEROSCOPY/HOLMIUM LASER/STENT PLACEMENT Right  01/15/2021   Procedure: CYSTOSCOPY/RETROGRADE/URETEROSCOPY/HOLMIUM LASER/STENT EXCHANGE;  Surgeon: Janith Lima, MD;  Location: [facility] ORS;  Service: Urology;  Laterality: Right;   ESOPHAGOGASTRODUODENOSCOPY N/A 12/08/2019   normal esophagus with 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   GIVENS CAPSULE STUDY N/A 01/13/2020   Procedure: GIVENS CAPSULE STUDY;  Surgeon: Daneil Dolin, MD;  Location: [facility];  Service: Endoscopy;  Laterality: N/A;  7:30am   MULTIPLE TOOTH EXTRACTIONS     60's   PARATHYROIDECTOMY     POLYPECTOMY  12/08/2019   Procedure: POLYPECTOMY;  Surgeon: Daneil Dolin, MD;  Location: [facility];  Service: Endoscopy;;   TOTAL HIP ARTHROPLASTY Right 10/29/2013   Procedure: RIGHT TOTAL HIP ARTHROPLASTY ANTERIOR APPROACH;  Surgeon: Mauri Pole, MD;  Location: [facility] OR[facility];  Service: Orthopedics;  Laterality: Right;     OB History   No obstetric history on file.     Family History  Problem Relation Age of Onset   Colitis Sister 77       alive   Cancer Sister 55       colon   Cancer Mother 33       uterine   Heart disease Father 38       heart failure   Heart attack Brother 86   Healthy Daughter    Healthy Son    Pulmonary embolism Brother 29   Heart disease Brother    Healthy Son    Healthy Son     Social History   Tobacco Use   Smoking status: Never   Smokeless tobacco: Never  Vaping Use   Vaping Use: Never used  Substance Use Topics   Alcohol use: No    Alcohol/week: 0.0 standard drinks   Drug use: No    Home Medications Prior to Admission medications   Medication Sig Start Date End Date Taking? Authorizing Provider  acetaminophen (TYLENOL) 325 MG tablet Take 2 tablets (650 mg total) by mouth every 6 (six) hours as needed for mild pain or headache (or Fever >/= 101). 04/04/19   Roxan Hockey, MD  allopurinol (ZYLOPRIM) 100 MG tablet Take 1 tablet (100 mg total) by mouth daily. 01/21/21   Loman Brooklyn,  FNP  apixaban (ELIQUIS) 5 MG TABS tablet Take 1 tablet (5 mg total) by mouth 2 (two) times daily. 01/08/21   Minus Breeding, MD  atorvastatin (LIPITOR) 20 MG tablet Take 1 tablet (20 mg total) by mouth daily. 04/02/21   Loman Brooklyn, FNP  Cholecalciferol (VITAMIN D3) 1.25 MG (50000 UT) CAPS Take 5,000 Units by mouth daily.    [provider]  Cyanocobalamin (VITAMIN B-12) 5000 MCG TBDP Take 5,000 mcg by mouth daily.    [provider]  diclofenac Sodium (VOLTAREN) 1 % GEL APPLY 4 GRAMS TOPICALLY 4  TIMES DAILY 01/26/21   Loman Brooklyn, FNP  EUTHYROX 50 MCG tablet TAKE 1 TABLET BY MOUTH ONCE DAILY BEFORE BREAKFAST 09/14/20   Claretta Fraise, MD  furosemide (LASIX) 20 MG tablet Take 1  tablet (20 mg total) by mouth daily. 04/30/21   Lendon Colonel, NP  GNP CRANBERRY EXTRACT PO Take by mouth.    [provider]  loratadine (CLARITIN) 10 MG tablet Take 10 mg by mouth daily.    [provider]  metoprolol succinate (TOPROL-XL) 25 MG 24 hr tablet Take 1 tablet (25 mg total) by mouth daily. 03/05/21   Lendon Colonel, NP  ONE TOUCH ULTRA TEST test strip USE UP TO FOUR TIMES DAILY AS DIRECTED 10/04/17   Terald Sleeper, PA-C  oxybutynin (DITROPAN-XL) 10 MG 24 hr tablet Take 1 tablet (10 mg total) by mouth at bedtime. 11/23/20   Loman Brooklyn, FNP  pantoprazole (PROTONIX) 40 MG tablet Take 1 tablet (40 mg total) by mouth 2 (two) times daily before a meal. 11/16/20   Loman Brooklyn, FNP    Allergies    Patient has no known allergies.  Review of Systems   Review of Systems  All other systems reviewed and are negative. Ten systems reviewed and are negative for acute change, except as noted in the HPI.   Physical Exam Updated Vital Signs BP (!) 160/56   Pulse (!) 45   Temp 98.2 F (36.8 C) (Oral)   Resp (!) 21   Ht 5\' 3"  (1.6 m)   Wt 88 kg   SpO2 100%   BMI 34.37 kg/m   Physical Exam Vitals and nursing note reviewed.  Constitutional:      General:  She is not in acute distress.    Appearance: Normal appearance. She is not ill-appearing, toxic-appearing or diaphoretic.  HENT:     Head: Normocephalic and atraumatic.     Right Ear: External ear normal.     Left Ear: External ear normal.     Nose: Nose normal.     Mouth/Throat:     Mouth: Mucous membranes are moist.     Pharynx: Oropharynx is clear. No oropharyngeal exudate or posterior oropharyngeal erythema.  Eyes:     General: No scleral icterus.       Right eye: No discharge.        Left eye: No discharge.     Extraocular Movements: Extraocular movements intact.     Comments: Pallor noted of the inferior palpebral conjunctiva bilaterally.  Cardiovascular:     Rate and Rhythm: Normal rate and regular rhythm.     Pulses: Normal pulses.     Heart sounds: Normal heart sounds. No murmur heard.   No friction rub. No gallop.  Pulmonary:     Effort: Pulmonary effort is normal. No respiratory distress.     Breath sounds: Normal breath sounds. No stridor. No wheezing, rhonchi or rales.  Abdominal:     General: Abdomen is flat.     Palpations: Abdomen is soft.     Tenderness: There is no abdominal tenderness.  Genitourinary:    Comments: Female nursing chaperone present.  Normal-appearing anal region.  No stool appreciated in the rectal vault.  No melena or hematochezia.  No tenderness appreciated throughout the exam. Musculoskeletal:        General: Normal range of motion.     Cervical back: Normal range of motion and neck supple. No tenderness.  Skin:    General: Skin is warm and dry.  Neurological:     General: No focal deficit present.     Mental Status: She is alert and oriented to person, place, and time.  Psychiatric:  Mood and Affect: Mood normal.        Behavior: Behavior normal.   ED Results / Procedures / Treatments   Labs (all labs ordered are listed, but only abnormal results are displayed) Labs Reviewed  VITAMIN B12 - Abnormal; Notable for the following  components:      Result Value   Vitamin B-12 6,739 (*)    All other components within normal limits  IRON AND TIBC - Abnormal; Notable for the following components:   Iron 22 (*)    Saturation Ratios 5 (*)    All other components within normal limits  FERRITIN - Abnormal; Notable for the following components:   Ferritin 6 (*)    All other components within normal limits  RETICULOCYTES - Abnormal; Notable for the following components:   RBC. 2.67 (*)    Immature Retic Fract 21.2 (*)    All other components within normal limits  BASIC METABOLIC PANEL - Abnormal; Notable for the following components:   Glucose, Bld 113 (*)    BUN 29 (*)    Creatinine, Ser 1.49 (*)    Calcium 11.1 (*)    GFR, Estimated 36 (*)    All other components within normal limits  CBC WITH DIFFERENTIAL/PLATELET - Abnormal; Notable for the following components:   RBC 2.64 (*)    Hemoglobin 7.3 (*)    HCT 24.0 (*)    RDW 16.0 (*)    All other components within normal limits  RESP PANEL BY RT-PCR (FLU A&B, COVID) ARPGX2  FOLATE  POC OCCULT BLOOD, ED  TYPE AND SCREEN  PREPARE RBC (CROSSMATCH)   EKG None  Radiology No results found.  Procedures Procedures   Medications Ordered in ED Medications  0.9 %  sodium chloride infusion (has no administration in time range)    ED Course  I have reviewed the triage vital signs and the nursing notes.  Pertinent labs & imaging results that were available during my care of the patient were reviewed by me and considered in my medical decision making (see chart for details).    MDM Rules/Calculators/A&P                          Pt is a 80 y.o. female who presents to the emergency department due to what appears to be symptomatic anemia.  Labs: CBC with a hemoglobin of 7.3, RBCs of 2.64, hematocrit of 24, RDW of 16. BMP with a glucose of 113, BUN of 29, creatinine of 1.49, calcium of 11.1, GFR of 36. POC occult blood is negative. Folate of 18.3. Ferritin  of 6. Iron of 22. Vitamin B12 is pending. Respiratory panel is negative.  I, Rayna Sexton, PA-C, personally reviewed and evaluated these images and lab results as part of my medical decision-making.  Patient's Hemoccult was negative but was unable to produce any stool in the patient's rectal vault.  No obvious signs of bleeding.  Abdomen is soft and nontender.  Hemoglobin today is 7.3 and her hemoglobin yesterday at her cardiologist office was 7.  It appears that it was 9.5, 2 months ago.  Given she appears symptomatic will transfuse 1 unit of PRBCs.  Patient had an admission last year which required a blood transfusion and she was found to be iron deficient.  Endoscopy as well as colonoscopy were performed during that admission last year.  Patient does note that she was on 5 mg of Eliquis twice per day when admitted for  anemia last year and this was reduced to 2.5 mg twice per day.  She states earlier this year it was once again increased to 5 mg twice per day.  Patient notes feeling weak but otherwise has no complaints.  No vomiting.  No hematemesis.  No hematuria.  Given patient's age, symptomatic anemia, and being anticoagulated, feel that she will require admission for further management.  We will discuss with the medicine team.  Note: Portions of this report may have been transcribed using voice recognition software. Every effort was made to ensure accuracy; however, inadvertent computerized transcription errors may be present.   Final Clinical Impression(s) / ED Diagnoses Final diagnoses:  Symptomatic anemia   Rx / DC Orders ED Discharge Orders     None        Rayna Sexton, Hershal Coria 05/01/21 1822    Luna Fuse, MD 05/01/21 1827

## 2021-05-01 NOTE — Telephone Encounter (Signed)
Reviewed labs drawn during appointment on Oct 7,2022 for pre-evaluation for planned TEE on May 07, 2021.  The CBC revealed a Hgb of 7.0 down 2+grams from prior CBC 2 months ago.  I called the patient to inform her of the lab results and recommended that she go to ED for admission and blood transfusion.  She chose to go to [facility] after contacting her son. He will bring her to the ED from [facility]. She is asymptomatic for dyspnea, near syncope or chest pain.  She is on Eliquis 5 mg BID for PAF. I have asked her to stop the Eliquis immediately.  She verbalizes understanding and is willing to go to the ED.   I have called [facility] ED and spoken with the charge nurse concerning my reason for her to come for evaluation. I have also informed the Heart Care Team, [place], [place] that the patient is going to ED in case the heart team is requested in the care of this patient in the future.   Jory Sims DNP, ANP,AACC

## 2021-05-01 NOTE — H&P (Signed)
History and Physical   [name] [name] [id number] DOB: March 12, 1941 DOA: 05/01/2021  Referring MD/NP/PA: Dr. Laverta Baltimore  PCP: Loman Brooklyn, FNP   Outpatient Specialists: Dr. Percival Spanish cardiology  Patient coming from: Home  Chief Complaint: Weakness  HPI: [name] [name] is a 80 y.o. female with medical history significant of atrial fibrillation, diabetes, morbid obesity, essential hypertension, history of pulmonary embolism and DVT, hypothyroidism, osteoarthritis, GERD among other things who is currently on Eliquis for her A. fib and DVT.  Patient has had recurrent anemia with transfusions.  She also has had multiple work-up in the past including EGD and colonoscopy as well as capsule endoscopy but no source of bleeding has been found.  She has been in the hospital with A. fib with RVR back in July.  Patient started having generalized weakness and some shortness of breath.  She was seen at the cardiologist office yesterday and labs were drawn as part of preop evaluation for planned TEE which was scheduled for October 14.  Her hemoglobin was found to be 7 g which is 2 g down from 9 g about 2 months ago.  Patient was called from the cardiologist office and informed to come to the ER for evaluation.  Patient has had some dyspnea with exertion otherwise she is able to do her normal activities.  Denied any chest pain.  Denied any fever.  Denied any nausea vomiting or diarrhea.  Patient's hemoglobin on arrival is 7.3.  She was transfused 1 unit of packed red blood cells and being admitted to the hospital for further evaluation..  ED Course: Temperature 98.6 blood pressure 185/79, pulse 72 respiratory 22 and oxygen sats 95% on room air.  Chemistry showed glucose 113 BUN 29 creatinine 1.49.  Total iron 22 with 5% saturation ferritin of 6.  B12 is 6739.  White count 4.9 hemoglobin 7.3 and platelets 249.  Reticulocyte count absolute is 77 with mostly immature reticulocyte fraction.  At this point patient  is being admitted to the hospital with symptomatic anemia.  Review of Systems: As per HPI otherwise 10 point review of systems negative.    Past Medical History:  Diagnosis Date   Anemia    Arthritis    Ostearthritis- hips, knees, fingers   Diabetes mellitus without complication (HCC)    DVT (deep venous thrombosis) (HCC)    Dyspnea    GERD (gastroesophageal reflux disease)    Headache(784.0)    tx. Valproic acid   History of kidney stones    Hypertension    Hypothyroidism    Pulmonary embolism [facility])     Past Surgical History:  Procedure Laterality Date   ABDOMINAL HYSTERECTOMY     BILATERAL HIP ARTHROSCOPY Left    BIOPSY  12/08/2019   Procedure: BIOPSY;  Surgeon: Daneil Dolin, MD;  Location: [facility];  Service: Endoscopy;;   CATARACT EXTRACTION, BILATERAL     COLONOSCOPY N/A 12/08/2019   polyps (tubular adenoma), diverticulosis, colonic lipoma, no surveillance due to age   57 W/ [place] Right 12/25/2020   Procedure: CYSTOSCOPY WITH RETROGRADE PYELOGRAM/URETERAL STENT PLACEMENT;  Surgeon: Janith Lima, MD;  Location: [facility];  Service: Urology;  Laterality: Right;   CYSTOSCOPY/URETEROSCOPY/HOLMIUM LASER/STENT PLACEMENT Right 01/15/2021   Procedure: CYSTOSCOPY/RETROGRADE/URETEROSCOPY/HOLMIUM LASER/STENT EXCHANGE;  Surgeon: Janith Lima, MD;  Location: [facility];  Service: Urology;  Laterality: Right;   ESOPHAGOGASTRODUODENOSCOPY N/A 12/08/2019   normal esophagus with 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   GIVENS CAPSULE STUDY  N/A 01/13/2020   Procedure: GIVENS CAPSULE STUDY;  Surgeon: Daneil Dolin, MD;  [facility]: [facility];  Service: Endoscopy;  Laterality: N/A;  7:30am   MULTIPLE TOOTH EXTRACTIONS     60's   PARATHYROIDECTOMY     POLYPECTOMY  12/08/2019   Procedure: POLYPECTOMY;  Surgeon: Daneil Dolin, MD;  [facility]: [facility];  Service: Endoscopy;;   TOTAL HIP ARTHROPLASTY Right 10/29/2013    Procedure: RIGHT TOTAL HIP ARTHROPLASTY ANTERIOR APPROACH;  Surgeon: Mauri Pole, MD;  Location: [facility];  Service: Orthopedics;  Laterality: Right;     reports that she has never smoked. She has never used smokeless tobacco. She reports that she does not drink alcohol and does not use drugs.  No Known Allergies  Family History  Problem Relation Age of Onset   Colitis Sister 43       alive   Cancer Sister 37       colon   Cancer Mother 64       uterine   Heart disease Father 68       heart failure   Heart attack Brother 46   Healthy Daughter    Healthy Son    Pulmonary embolism Brother 6   Heart disease Brother    Healthy Son    Healthy Son      Prior to Admission medications   Medication Sig Start Date End Date Taking? Authorizing Provider  acetaminophen (TYLENOL) 325 MG tablet Take 2 tablets (650 mg total) by mouth every 6 (six) hours as needed for mild pain or headache (or Fever >/= 101). 04/04/19  Yes Emokpae, Courage, MD  allopurinol (ZYLOPRIM) 100 MG tablet Take 1 tablet (100 mg total) by mouth daily. 01/21/21  Yes Loman Brooklyn, FNP  apixaban (ELIQUIS) 5 MG TABS tablet Take 1 tablet (5 mg total) by mouth 2 (two) times daily. 01/08/21  Yes Minus Breeding, MD  atorvastatin (LIPITOR) 20 MG tablet Take 1 tablet (20 mg total) by mouth daily. Patient taking differently: Take 20 mg by mouth daily after breakfast. 04/02/21  Yes [name] F, FNP  Cholecalciferol (VITAMIN D-3) 125 MCG (5000 UT) TABS Take 5,000 Units by mouth daily with breakfast.   Yes [provider]  Cyanocobalamin (VITAMIN B-12) 5000 MCG TBDP Take 5,000 mcg by mouth daily.   Yes [provider]  diclofenac Sodium (VOLTAREN) 1 % GEL APPLY 4 GRAMS TOPICALLY 4  TIMES DAILY Patient taking differently: Apply 2-4 g topically 4 (four) times daily as needed (for bilateral knee pain). 01/26/21  Yes Loman Brooklyn, FNP  EUTHYROX 50 MCG tablet TAKE 1 TABLET BY MOUTH ONCE DAILY BEFORE  BREAKFAST Patient taking differently: Take 50 mcg by mouth daily before breakfast. 09/14/20  Yes Claretta Fraise, MD  furosemide (LASIX) 20 MG tablet Take 1 tablet (20 mg total) by mouth daily. 04/30/21  Yes Lendon Colonel, NP  GNP CRANBERRY EXTRACT PO Take 1 capsule by mouth in the morning.   Yes [provider]  loratadine (CLARITIN) 10 MG tablet Take 10 mg by mouth daily.   Yes [provider]  metoprolol succinate (TOPROL-XL) 25 MG 24 hr tablet Take 1 tablet (25 mg total) by mouth daily. 03/05/21  Yes Lendon Colonel, NP  oxybutynin (DITROPAN-XL) 10 MG 24 hr tablet Take 1 tablet (10 mg total) by mouth at bedtime. 11/23/20  Yes Loman Brooklyn, FNP  pantoprazole (PROTONIX) 40 MG tablet Take 1 tablet (40 mg total) by mouth 2 (two)  times daily before a meal. 11/16/20  Yes [name] F, FNP  SYSTANE HYDRATION PF 0.4-0.3 % SOLN Place 1 drop into both eyes 3 (three) times daily as needed (for dryness).   Yes [provider]  ONE TOUCH ULTRA TEST test strip USE UP TO FOUR TIMES DAILY AS DIRECTED Patient taking differently: 1 each by Other route 4 (four) times daily. 10/04/17   Terald Sleeper, PA-C    Physical Exam: Vitals:   05/01/21 1800 05/01/21 1829 05/01/21 1830 05/01/21 1845  BP: (!) 160/56 (!) 145/52 (!) 145/52 (!) 158/69  Pulse: (!) 45 72 66 (!) 48  Resp: (!) 21 18 16 18   Temp:  98 F (36.7 C)  98.1 F (36.7 C)  TempSrc:  Oral  Oral  SpO2: 100%  99% 99%  Weight:      Height:          Constitutional: Acutely ill looking no distress Vitals:   05/01/21 1800 05/01/21 1829 05/01/21 1830 05/01/21 1845  BP: (!) 160/56 (!) 145/52 (!) 145/52 (!) 158/69  Pulse: (!) 45 72 66 (!) 48  Resp: (!) 21 18 16 18   Temp:  98 F (36.7 C)  98.1 F (36.7 C)  TempSrc:  Oral  Oral  SpO2: 100%  99% 99%  Weight:      Height:       Eyes: PERRL, lids and conjunctivae pallor  ENMT: Mucous membranes are moist. Posterior pharynx clear of any exudate or lesions.Normal  dentition.  Neck: normal, supple, no masses, no thyromegaly Respiratory: clear to auscultation bilaterally, no wheezing, no crackles. Normal respiratory effort. No accessory muscle use.  Cardiovascular: Bradycardia, no murmurs / rubs / gallops. No extremity edema. 2+ pedal pulses. No carotid bruits.  Abdomen: no tenderness, no masses palpated. No hepatosplenomegaly. Bowel sounds positive.  Musculoskeletal: no clubbing / cyanosis. No joint deformity upper and lower extremities. Good ROM, no contractures. Normal muscle tone.  Skin: no rashes, lesions, ulcers. No induration Neurologic: CN 2-12 grossly intact. Sensation intact, DTR normal. Strength 5/5 in all 4.  Psychiatric: Normal judgment and insight. Alert and oriented x 3. Normal mood.     Labs on Admission: I have personally reviewed following labs and imaging studies  CBC: Recent Labs  Lab 04/30/21 1139 05/01/21 1539  WBC 4.6 4.9  NEUTROABS  --  2.8  HGB 7.0* 7.3*  HCT 22.2* 24.0*  MCV 88 90.9  PLT 228 956   Basic Metabolic Panel: Recent Labs  Lab 04/30/21 1139 05/01/21 1539  NA 141 139  K 4.3 4.1  CL 104 106  CO2 24 25  GLUCOSE 110* 113*  BUN 28* 29*  CREATININE 1.30* 1.49*  CALCIUM 10.8* 11.1*   GFR: Estimated Creatinine Clearance: 32.2 mL/min (A) (by C-G formula based on SCr of 1.49 mg/dL (H)). Liver Function Tests: No results for input(s): AST, ALT, ALKPHOS, BILITOT, PROT, ALBUMIN in the last 168 hours. No results for input(s): LIPASE, AMYLASE in the last 168 hours. No results for input(s): AMMONIA in the last 168 hours. Coagulation Profile: No results for input(s): INR, PROTIME in the last 168 hours. Cardiac Enzymes: No results for input(s): CKTOTAL, CKMB, CKMBINDEX, TROPONINI in the last 168 hours. BNP (last 3 results) No results for input(s): PROBNP in the last 8760 hours. HbA1C: No results for input(s): HGBA1C in the last 72 hours. CBG: No results for input(s): GLUCAP in the last 168 hours. Lipid  Profile: No results for input(s): CHOL, HDL, LDLCALC, TRIG, CHOLHDL, LDLDIRECT in the  last 72 hours. Thyroid Function Tests: No results for input(s): TSH, T4TOTAL, FREET4, T3FREE, THYROIDAB in the last 72 hours. Anemia Panel: Recent Labs    05/01/21 1539  VITAMINB12 6,739*  FOLATE 18.3  FERRITIN 6*  TIBC 439  IRON 22*  RETICCTPCT 2.9   Urine analysis:    Component Value Date/Time   COLORURINE YELLOW 12/24/2020 2136   APPEARANCEUR Cloudy (A) 03/23/2021 1019   LABSPEC 1.013 12/24/2020 2136   PHURINE 5.0 12/24/2020 2136   GLUCOSEU Negative 03/23/2021 1019   HGBUR LARGE (A) 12/24/2020 2136   BILIRUBINUR Negative 03/23/2021 1019   KETONESUR NEGATIVE 12/24/2020 2136   PROTEINUR Trace (A) 03/23/2021 1019   PROTEINUR 100 (A) 12/24/2020 2136   UROBILINOGEN 0.2 10/22/2013 1127   NITRITE Positive (A) 03/23/2021 1019   NITRITE NEGATIVE 12/24/2020 2136   LEUKOCYTESUR 3+ (A) 03/23/2021 1019   LEUKOCYTESUR LARGE (A) 12/24/2020 2136   Sepsis Labs: @LABRCNTIP (procalcitonin:4,lacticidven:4) ) Recent Results (from the past 240 hour(s))  Resp Panel by RT-PCR (Flu A&B, Covid) Nasopharyngeal Swab     Status: None   Collection Time: 05/01/21  5:22 PM   Specimen: Nasopharyngeal Swab; Nasopharyngeal(NP) swabs in vial transport medium  Result Value Ref Range Status   SARS Coronavirus 2 by RT PCR NEGATIVE NEGATIVE Final    Comment: (NOTE) SARS-CoV-2 target nucleic acids are NOT DETECTED.  The SARS-CoV-2 RNA is generally detectable in upper respiratory specimens during the acute phase of infection. The lowest concentration of SARS-CoV-2 viral copies this assay can detect is 138 copies/mL. A negative result does not preclude SARS-Cov-2 infection and should not be used as the sole basis for treatment or other patient management decisions. A negative result may occur with  improper specimen collection/handling, submission of specimen other than nasopharyngeal swab, presence of viral mutation(s)  within the areas targeted by this assay, and inadequate number of viral copies(<138 copies/mL). A negative result must be combined with clinical observations, patient history, and epidemiological information. The expected result is Negative.  Fact Sheet for Patients:  EntrepreneurPulse.com.au  Fact Sheet for Healthcare Providers:  IncredibleEmployment.be  This test is no t yet approved or cleared by the [place] FDA and  has been authorized for detection and/or diagnosis of SARS-CoV-2 by FDA under an Emergency Use Authorization (EUA). This EUA will remain  in effect (meaning this test can be used) for the duration of the COVID-19 declaration under Section 564(b)(1) of the Act, 21 U.S.C.section 360bbb-3(b)(1), unless the authorization is terminated  or revoked sooner.       Influenza A by PCR NEGATIVE NEGATIVE Final   Influenza B by PCR NEGATIVE NEGATIVE Final    Comment: (NOTE) The Xpert Xpress SARS-CoV-2/FLU/RSV plus assay is intended as an aid in the diagnosis of influenza from Nasopharyngeal swab specimens and should not be used as a sole basis for treatment. Nasal washings and aspirates are unacceptable for Xpert Xpress SARS-CoV-2/FLU/RSV testing.  Fact Sheet for Patients: EntrepreneurPulse.com.au  Fact Sheet for Healthcare Providers: IncredibleEmployment.be  This test is not yet approved or cleared by the [place] FDA and has been authorized for detection and/or diagnosis of SARS-CoV-2 by FDA under an Emergency Use Authorization (EUA). This EUA will remain in effect (meaning this test can be used) for the duration of the COVID-19 declaration under Section 564(b)(1) of the Act, 21 U.S.C. section 360bbb-3(b)(1), unless the authorization is terminated or revoked.  Performed at [facility], [place] [address] [place], [place] [address]      Radiological Exams on  Admission: No results found.    Assessment/Plan Principal Problem:   Symptomatic anemia Active Problems:   Morbid obesity ([place])   Essential hypertension   Hypothyroidism   Hyperlipidemia associated with type 2 diabetes mellitus ([place])   Gastroesophageal reflux disease without esophagitis   Unprovoked DVT and Unprovoked Pulmonary Embolism -Dxed 03/2019   Iron deficiency anemia due to chronic blood loss   Acute combined systolic and diastolic heart failure ([place])     #1 symptomatic anemia: Suspected due to GI bleed.  Patient has had previous extensive work-up.  At this point we will transfuse the patient.  Observe the patient overnight.  Monitor H&H.  If stable patient most likely will need GI evaluation probably outpatient.  Her Eliquis is being held.  Patient however has both PE and A. fib and is likely going to require continued anticoagulation so work-up will be essential for that.  #2 hypothyroidism: Continue levothyroxine  #3 essential hypertension: Continue with blood pressure management.  #4 hyperlipidemia: Continue with statin  #5 GERD: Continue with PPIs  #6 chronic systolic dysfunction: Continue home regimen except diuretics   DVT prophylaxis: SCD Code Status: Full code Family Communication: Son at bedside Disposition Plan: Home Consults called: None Admission status: Observation  Severity of Illness: The appropriate patient status for this patient is OBSERVATION. Observation status is judged to be reasonable and necessary in order to provide the required intensity of service to ensure the patient's safety. The patient's presenting symptoms, physical exam findings, and initial radiographic and laboratory data in the context of their medical condition is felt to place them at decreased risk for further clinical deterioration. Furthermore, it is anticipated that the patient will be medically stable for discharge from the hospital within 2 midnights of admission. The  following factors support the patient status of observation.   " The patient's presenting symptoms include symptomatic anemia. " The physical exam findings include pale. " The initial radiographic and laboratory data are hemoglobin 7.3.   Barbette Merino MD [facility] Pager [phone]  If 7PM-7AM, please contact night-coverage [email or web address] Password [email or web address]  05/01/2021, 7:10 PM

## 2021-05-01 NOTE — ED Triage Notes (Signed)
Patient had blood work drawn yesterday and was notified today that her Hgb was 7.0. Patient denies any obvious bleeding. Patient c/o fatigue, but not more than usual

## 2021-05-01 NOTE — ED Notes (Signed)
Ambulated to RR w/ walker.

## 2021-05-02 LAB — CBC
HCT: 23 % — ABNORMAL LOW (ref 36.0–46.0)
Hemoglobin: 7.2 g/dL — ABNORMAL LOW (ref 12.0–15.0)
MCH: 27.8 pg (ref 26.0–34.0)
MCHC: 31.3 g/dL (ref 30.0–36.0)
MCV: 88.8 fL (ref 80.0–100.0)
Platelets: 200 10*3/uL (ref 150–400)
RBC: 2.59 MIL/uL — ABNORMAL LOW (ref 3.87–5.11)
RDW: 15.4 % (ref 11.5–15.5)
WBC: 5 10*3/uL (ref 4.0–10.5)
nRBC: 0 % (ref 0.0–0.2)

## 2021-05-02 LAB — HEMOGLOBIN A1C
Hgb A1c MFr Bld: 5.1 % (ref 4.8–5.6)
Mean Plasma Glucose: 99.67 mg/dL

## 2021-05-02 LAB — COMPREHENSIVE METABOLIC PANEL
ALT: 11 U/L (ref 0–44)
AST: 12 U/L — ABNORMAL LOW (ref 15–41)
Albumin: 3.4 g/dL — ABNORMAL LOW (ref 3.5–5.0)
Alkaline Phosphatase: 61 U/L (ref 38–126)
Anion gap: 8 (ref 5–15)
BUN: 27 mg/dL — ABNORMAL HIGH (ref 8–23)
CO2: 24 mmol/L (ref 22–32)
Calcium: 10.5 mg/dL — ABNORMAL HIGH (ref 8.9–10.3)
Chloride: 109 mmol/L (ref 98–111)
Creatinine, Ser: 1.19 mg/dL — ABNORMAL HIGH (ref 0.44–1.00)
GFR, Estimated: 47 mL/min — ABNORMAL LOW (ref >60)
Glucose, Bld: 102 mg/dL — ABNORMAL HIGH (ref 70–99)
Potassium: 3.7 mmol/L (ref 3.5–5.1)
Sodium: 141 mmol/L (ref 135–145)
Total Bilirubin: 0.5 mg/dL (ref 0.3–1.2)
Total Protein: 5.7 g/dL — ABNORMAL LOW (ref 6.5–8.1)

## 2021-05-02 LAB — GLUCOSE, CAPILLARY
Glucose-Capillary: 93 mg/dL (ref 70–99)
Glucose-Capillary: 98 mg/dL (ref 70–99)
Glucose-Capillary: 101 mg/dL — ABNORMAL HIGH (ref 70–99)
Glucose-Capillary: 114 mg/dL — ABNORMAL HIGH (ref 70–99)

## 2021-05-02 LAB — OCCULT BLOOD X 1 CARD TO LAB, STOOL: Fecal Occult Bld: POSITIVE — AB

## 2021-05-02 MED ORDER — DICLOFENAC SODIUM 1 % EX GEL: 2.0000 g | Freq: Four times a day (QID) | CUTANEOUS | Status: DC | PRN

## 2021-05-02 MED ORDER — VITAMIN B-12 1000 MCG PO TABS
1000.0000 ug | ORAL_TABLET | Freq: Every day | ORAL | Status: DC
  Administered 2021-05-02 – 2021-05-04 (×3): 1000 ug via ORAL
  Filled 2021-05-02 (×3): qty 1

## 2021-05-02 MED ORDER — ATORVASTATIN CALCIUM 20 MG PO TABS
20.0000 mg | ORAL_TABLET | Freq: Every day | ORAL | Status: DC
  Administered 2021-05-02 – 2021-05-04 (×3): 20 mg via ORAL
  Filled 2021-05-02 (×3): qty 1

## 2021-05-02 MED ORDER — FUROSEMIDE 20 MG PO TABS
20.0000 mg | ORAL_TABLET | Freq: Every day | ORAL | Status: DC
  Administered 2021-05-02 – 2021-05-04 (×3): 20 mg via ORAL
  Filled 2021-05-02 (×3): qty 1

## 2021-05-02 MED ORDER — ALLOPURINOL 100 MG PO TABS
100.0000 mg | ORAL_TABLET | Freq: Every day | ORAL | Status: DC
  Administered 2021-05-02 – 2021-05-04 (×3): 100 mg via ORAL
  Filled 2021-05-02 (×3): qty 1

## 2021-05-02 MED ORDER — OXYBUTYNIN CHLORIDE ER 5 MG PO TB24
10.0000 mg | ORAL_TABLET | Freq: Every day | ORAL | Status: DC
  Administered 2021-05-02 – 2021-05-03 (×2): 10 mg via ORAL
  Filled 2021-05-02 (×2): qty 2

## 2021-05-02 MED ORDER — SODIUM CHLORIDE 0.9 % IV SOLN
510.0000 mg | Freq: Once | INTRAVENOUS | Status: AC
  Administered 2021-05-02: 510 mg via INTRAVENOUS
  Filled 2021-05-02: qty 17

## 2021-05-02 MED ORDER — LEVOTHYROXINE SODIUM 50 MCG PO TABS
50.0000 ug | ORAL_TABLET | Freq: Every day | ORAL | Status: DC
  Administered 2021-05-02 – 2021-05-04 (×3): 50 ug via ORAL
  Filled 2021-05-02 (×4): qty 1

## 2021-05-02 MED ORDER — METOPROLOL SUCCINATE ER 25 MG PO TB24
25.0000 mg | ORAL_TABLET | Freq: Every day | ORAL | Status: DC
  Administered 2021-05-02 – 2021-05-04 (×3): 25 mg via ORAL
  Filled 2021-05-02 (×3): qty 1

## 2021-05-02 MED ORDER — PANTOPRAZOLE SODIUM 40 MG PO TBEC
40.0000 mg | DELAYED_RELEASE_TABLET | Freq: Two times a day (BID) | ORAL | Status: DC
  Administered 2021-05-02 – 2021-05-04 (×4): 40 mg via ORAL
  Filled 2021-05-02 (×4): qty 1

## 2021-05-02 MED ORDER — POLYVINYL ALCOHOL 1.4 % OP SOLN: 1.0000 [drp] | OPHTHALMIC | Status: DC | PRN

## 2021-05-02 MED ORDER — ENOXAPARIN SODIUM 100 MG/ML IJ SOSY
90.0000 mg | PREFILLED_SYRINGE | Freq: Two times a day (BID) | INTRAMUSCULAR | Status: DC
  Administered 2021-05-02 – 2021-05-04 (×4): 90 mg via SUBCUTANEOUS
  Filled 2021-05-02 (×4): qty 1

## 2021-05-02 MED ORDER — POLYETHYL GLYC-PROPYL GLYC PF 0.4-0.3 % OP SOLN: 1.0000 [drp] | Freq: Three times a day (TID) | OPHTHALMIC | Status: DC | PRN

## 2021-05-02 NOTE — Progress Notes (Signed)
PROGRESS NOTE  [name]  DOB: April 27, 1941  PCP: Loman Brooklyn, FNP [id number]:[id number]  DOA: 05/01/2021  LOS: 1 day  Hospital Day: 2   Chief Complaint  Patient presents with   Abnormal Lab    Brief narrative: Simra Fiebig is a 80 y.o. female with PMH significant for obesity, DM2, HTN, A. fib, DVT/PE on Eliquis, hypothyroidism, GERD, osteoarthritis and history of GI bleeding. Patient presented to the ED on 10/8 with complaint of progressively worsening dyspnea, generalized weakness.  Earlier in the day, she was seen at cardiologist office.  Labs were drawn as part of preop evaluation for planned TEE on October 14.  Labs showed hemoglobin low at 7 which is less than 9 in July 2022.  Patient was hence directed to ED.  In the ED, patient was afebrile, blood pressure elevated to 185/79, breathing on room air Repeated labs showed hemoglobin at 7.3, WBC normal, platelet 249, anemia panel with ferritin low at 6, creatinine elevated to 1.49. Patient was given a unit of PRBC transfusion and admitted to hospitalist service for symptomatic anemia. See below for details  Subjective: Patient was seen and examined this morning.  Pleasant elderly Caucasian female.  Propped up in bed.  Not in distress.  No new symptoms.  Received a unit of PRBC last night.  I gave her dose of IV Feraheme later this morning. Chart reviewed. Heart rate in 40s and 50s overnight, blood pressure from 150s to 170s Labs with creatinine down to 1.19, hemoglobin remains at 7.2 despite 1 unit of transfusion.  Assessment/Plan: Acute on chronic iron deficiency anemia -Patient states that she was noted to be anemic in 2021 and required 2 units of PRBC transfusion.  She was hospitalized at [facility] at that time and underwent EGD, colonoscopy and capsule endoscopy however the source of bleeding was not identified. -She has been on Eliquis since 2019 for DVT PE and A. fib.  The dose of her Eliquis was reduced  to 2.5 mg twice daily and she tolerated it for several months. -She states that her cardiologist Dr. Percival Spanish increased her Eliquis to 5 mg twice daily in June 2022 after which she has been dropping hemoglobin without obvious bleeding.  Sometimes in the last few months, her oral iron supplementation was also stopped. -On chart review I noted that she had a ferritin level of 149 in June which dropped to 41 in August and is now at 6. -Baseline hemoglobin 9.5 in August, presented with a low hemoglobin of 7.3.  Despite 1 unit of PRBC transfusion, patient has no change in hemoglobin this morning. -Eliquis remains hold.  Obtain FOBT. -Continue PPI. -Received 1 unit of PRBC last night.  1 dose of IV Feraheme given this morning. -I also noted the patient takes 5000 MCG of vitamin B12 daily.  Her vitamin B12 level has significantly improved from 122 to over 6000 in 2 months.  I would reduce the dose of vitamin B12 to 1000 MCG daily. Recent Labs    05/04/20 0000 05/05/20 0000 10/20/20 1019 12/24/20 1719 12/30/20 0436 12/30/20 0653 01/05/21 1641 02/10/21 0410 02/24/21 1144 04/30/21 1139 05/01/21 1539 05/02/21 0511  HGB  --    < > 13.7   < >  --  11.1*   < > 8.5* 9.5* 7.0* 7.3* 7.2*  MCV  --    < > 93   < >  --  93.9   < > 95.8 92 88 90.9 88.8  VITAMINB12  --   --   --   --   --  763  --   --  122*  --  6,739*  --   FOLATE  --   --   --   --  17.2  --   --   --  8.2  --  18.3  --   FERRITIN 42  --  54  --   --  149  --   --  41  --  6*  --   TIBC >736*  --  350  --   --  282  --   --  <377  --  439  --   IRON 36  --  98  --   --  23*  --   --  360*  --  22*  --   RETICCTPCT  --   --   --   --   --  1.0  --   --  3.3*  --  2.9  --    < > = values in this interval not displayed.   Essential hypertension Chronic diastolic CHF -Home meds include Toprol 25 mg daily, Lasix 20 mg daily. -Last echocardiogram from September 2022 showed EF of 55 to 60% with grade 3 diastolic dysfunction, mildly elevated  pulmonary systolic pressure to 38. -Continue Toprol and Lasix.  Severe mitral regurgitation -Echo from September 2 102 showed severe mitral regurgitation.  Patient was seen by cardiology and plan for outpatient TEE scheduled for 10/14.  History of valvular A. Fib -Currently rate controlled A. fib.   -Probably related to severe mitral regurgitation -Eliquis on hold for now.  On full dose Lovenox.  History of DVT/PE -Right leg DVT and PE in 2019.  Type 2 diabetes mellitus -A1c 5.7 on June 2022 -Does not seem to be on any antidiabetic regimen at home. -Currently on sliding scales with Accu-Cheks -Blood sugar level controlled this morning. Recent Labs  Lab 05/01/21 2029 05/02/21 0808 05/02/21 1218  GLUCAP 92 93 98   Hyperlipidemia -Continue Lipitor  Hypothyroidism -Continue Synthroid  Gout -Allopurinol  Mobility: Encourage ambulation Code Status:   Code Status: Full Code  Nutritional status: Body mass index is 35.23 kg/m.     Diet:  Diet Order             Diet heart healthy/carb modified Room service appropriate? Yes; Fluid consistency: Thin  Diet effective now                  DVT prophylaxis:  SCDs Start: 05/01/21 2020   Antimicrobials: None Fluid: None Consultants: None so far Family Communication: None at bedside  Status is: Inpatient  Remains inpatient appropriate because: Needs anemia work-up  Dispo: The patient is from: Home              Anticipated d/c is to: Home in next 2 to 3 days              Patient currently is not medically stable to d/c.   Difficult to place patient No     Infusions:    Scheduled Meds:  allopurinol  100 mg Oral Daily   atorvastatin  20 mg Oral Daily   enoxaparin (LOVENOX) injection  90 mg Subcutaneous Q12H   furosemide  20 mg Oral Daily   insulin aspart  0-20 Units Subcutaneous TID WC   insulin aspart  0-5 Units Subcutaneous QHS   levothyroxine  50 mcg Oral QAC breakfast   metoprolol succinate  25 mg  Oral Daily   oxybutynin  10 mg  Oral QHS   pantoprazole  40 mg Oral BID AC   vitamin B-12  1,000 mcg Oral Daily    Antimicrobials: Anti-infectives (From admission, onward)    None       PRN meds: diclofenac Sodium, ondansetron **OR** ondansetron (ZOFRAN) IV, polyvinyl alcohol   Objective: Vitals:   05/01/21 2129 05/02/21 0300  BP: (!) 169/87 (!) 151/63  Pulse: 63 (!) 54  Resp: 16 18  Temp: 97.9 F (36.6 C) 98.9 F (37.2 C)  SpO2: 97%     Intake/Output Summary (Last 24 hours) at 05/02/2021 1354 Last data filed at 05/02/2021 1100 Gross per 24 hour  Intake 615 ml  Output --  Net 615 ml   Filed Weights   05/01/21 1438 05/01/21 2129  Weight: 88 kg 90.2 kg   Weight change:  Body mass index is 35.23 kg/m.   Physical Exam: General exam: Pleasant, elderly Caucasian female. Skin: No rashes, lesions or ulcers. HEENT: Atraumatic, normocephalic, no obvious bleeding Lungs: Clear to auscultation bilaterally CVS: Rate controlled A. fib GI/Abd soft, nontender, nondistended, bowel sound present CNS: Alert, awake, oriented x3 Psychiatry: Mood appropriate Extremities: Trace bilateral pedal edema, no calf tenderness  Data Review: I have personally reviewed the laboratory data and studies available.  Recent Labs  Lab 04/30/21 1139 05/01/21 1539 05/02/21 0511  WBC 4.6 4.9 5.0  NEUTROABS  --  2.8  --   HGB 7.0* 7.3* 7.2*  HCT 22.2* 24.0* 23.0*  MCV 88 90.9 88.8  PLT 228 249 200   Recent Labs  Lab 04/30/21 1139 05/01/21 1539 05/02/21 0511  NA 141 139 141  K 4.3 4.1 3.7  CL 104 106 109  CO2 24 25 24   GLUCOSE 110* 113* 102*  BUN 28* 29* 27*  CREATININE 1.30* 1.49* 1.19*  CALCIUM 10.8* 11.1* 10.5*    F/u labs ordered Unresulted Labs (From admission, onward)     Start     Ordered   05/03/21 0500  CBC with Differential/Platelet  Daily,   R     Question:  Specimen collection method  Answer:  Lab=Lab collect   05/02/21 0842   05/03/21 5852  Basic metabolic  panel  Daily,   R     Question:  Specimen collection method  Answer:  Lab=Lab collect   05/02/21 0842   05/02/21 0841  Occult blood card to lab, stool  Once,   R        05/02/21 0840            Signed, Terrilee Croak, MD [facility] 05/02/2021

## 2021-05-02 NOTE — Progress Notes (Signed)
ANTICOAGULATION CONSULT NOTE - Initial Consult  Pharmacy Consult for Lovenox Indication: AFIB while Eliquis on hold  No Known Allergies  Patient Measurements: Height: 5\' 3"  (160 cm) Weight: 90.2 kg (198 lb 13.7 oz) IBW/kg (Calculated) : 52.4  Vital Signs: Temp: 98.9 F (37.2 C) (10/09 0300) Temp Source: Oral (10/09 0300) BP: 151/63 (10/09 0300) Pulse Rate: 54 (10/09 0300)  Labs: Recent Labs    04/30/21 1139 05/01/21 1539 05/02/21 0511  HGB 7.0* 7.3* 7.2*  HCT 22.2* 24.0* 23.0*  PLT 228 249 200  CREATININE 1.30* 1.49* 1.19*    Estimated Creatinine Clearance: 40.8 mL/min (A) (by C-G formula based on SCr of 1.19 mg/dL (H)).   Medical History: Past Medical History:  Diagnosis Date   Anemia    Arthritis    Ostearthritis- hips, knees, fingers   Diabetes mellitus without complication (HCC)    DVT (deep venous thrombosis) (HCC)    Dyspnea    GERD (gastroesophageal reflux disease)    Headache(784.0)    tx. Valproic acid   History of kidney stones    Hypertension    Hypothyroidism    Pulmonary embolism (HCC)     Medications:  Eliquis 5 mg bid last dose 10/8  Assessment: 80 y/o F with a h/o afib and PE/DVT on Eliquis PTA admitted with acute on chronic anemia. Pharmacy consulted to dose Lovenox while Eliquis is on hold.   Today, 05/02/21  Hgb stable after transfusion yesterday No reported bleeding SCr 1.19 at baseline   Goal of Therapy:  Anti-Xa level 0.6-1 units/ml 4hrs after LMWH dose given Monitor platelets by anticoagulation protocol: Yes   Plan:  Will initiate Lovenox 1 mg/kg bid.  Will f/u renal function, CBC, s/s of bleeding, and plans to resume Eliquis.   Ulice Dash D 05/02/2021,9:21 AM

## 2021-05-03 LAB — BASIC METABOLIC PANEL
Anion gap: 3 — ABNORMAL LOW (ref 5–15)
BUN: 24 mg/dL — ABNORMAL HIGH (ref 8–23)
CO2: 26 mmol/L (ref 22–32)
Calcium: 10.5 mg/dL — ABNORMAL HIGH (ref 8.9–10.3)
Chloride: 109 mmol/L (ref 98–111)
Creatinine, Ser: 1.25 mg/dL — ABNORMAL HIGH (ref 0.44–1.00)
GFR, Estimated: 44 mL/min — ABNORMAL LOW (ref >60)
Glucose, Bld: 106 mg/dL — ABNORMAL HIGH (ref 70–99)
Potassium: 3.5 mmol/L (ref 3.5–5.1)
Sodium: 138 mmol/L (ref 135–145)

## 2021-05-03 LAB — CBC WITH DIFFERENTIAL/PLATELET
Abs Immature Granulocytes: 0.02 10*3/uL (ref 0.00–0.07)
Basophils Absolute: 0 10*3/uL (ref 0.0–0.1)
Basophils Relative: 0 %
Eosinophils Absolute: 0.2 10*3/uL (ref 0.0–0.5)
Eosinophils Relative: 4 %
HCT: 23.6 % — ABNORMAL LOW (ref 36.0–46.0)
Hemoglobin: 7.4 g/dL — ABNORMAL LOW (ref 12.0–15.0)
Immature Granulocytes: 0 %
Lymphocytes Relative: 35 %
Lymphs Abs: 1.6 10*3/uL (ref 0.7–4.0)
MCH: 27.9 pg (ref 26.0–34.0)
MCHC: 31.4 g/dL (ref 30.0–36.0)
MCV: 89.1 fL (ref 80.0–100.0)
Monocytes Absolute: 0.7 10*3/uL (ref 0.1–1.0)
Monocytes Relative: 14 %
Neutro Abs: 2.1 10*3/uL (ref 1.7–7.7)
Neutrophils Relative %: 47 %
Platelets: 199 10*3/uL (ref 150–400)
RBC: 2.65 MIL/uL — ABNORMAL LOW (ref 3.87–5.11)
RDW: 15.4 % (ref 11.5–15.5)
WBC: 4.7 10*3/uL (ref 4.0–10.5)
nRBC: 0 % (ref 0.0–0.2)

## 2021-05-03 LAB — TYPE AND SCREEN
ABO/RH(D): O POS
Antibody Screen: NEGATIVE
Unit division: 0

## 2021-05-03 LAB — GLUCOSE, CAPILLARY
Glucose-Capillary: 99 mg/dL (ref 70–99)
Glucose-Capillary: 92 mg/dL (ref 70–99)
Glucose-Capillary: 97 mg/dL (ref 70–99)
Glucose-Capillary: 114 mg/dL — ABNORMAL HIGH (ref 70–99)

## 2021-05-03 LAB — BPAM RBC
Blood Product Expiration Date: 202211072359
ISSUE DATE / TIME: 202210081822
Unit Type and Rh: 5100

## 2021-05-03 MED ORDER — ACETAMINOPHEN 325 MG PO TABS
650.0000 mg | ORAL_TABLET | Freq: Four times a day (QID) | ORAL | Status: DC | PRN
  Administered 2021-05-03: 650 mg via ORAL
  Filled 2021-05-03: qty 2

## 2021-05-03 MED ORDER — INFLUENZA VAC A&B SA ADJ QUAD 0.5 ML IM PRSY
0.5000 mL | PREFILLED_SYRINGE | INTRAMUSCULAR | Status: DC
  Filled 2021-05-03: qty 0.5

## 2021-05-03 NOTE — Consult Note (Signed)
Referring Provider: Hospitalist Primary Care Physician:  Loman Brooklyn, FNP Primary Gastroenterologist:  Dr. Manus Rudd  Reason for Consultation:  Symptomatic anemia  HPI: [name] [name] is a 80 y.o. female  with medical history of diabetes, morbid obesity, essential hypertension, chronic anemia, history of pulmonary embolism and DVT, hypothyroidism, GERD, history of PE/DVT, Atrial fibrillation on Eliquis outpatient, currently only Lovenox presents for symptomatic anemia.   Patient has history of recurrent anemia and mutliple work ups in the past. Most recent work-up was in May 2021 where patient had a difficult colonoscopy with 3 polyps, diverticulosis otherwise unremarkable.  Endoscopy showed mild abnormal antrum, Biospy showed hyperemia/microthombi, possibly early GAVE, negative H pylori and no dysplasia..  CT unremarkable.  Capsule endoscopy with scattered small bowel erosions, abnormal small bowel also noted with edema, inflammation/erosion.  Patient with drop in hemoglobin from 9.5 in August to 7.3 on this admission.  Patient has received 1 unit packed red blood cells and hemoglobin is still at 7.4.   Prior to this she was in her normal state of health and found to have low hemoglobin at her cardiologist. She has been having atrial fibrillation with some minor shortness of breath and fatigue, denies chest pain or worsening shortness of breath. She denies any worsening GERD, dysphagia, nausea, vomiting. On protonix once daily prior to admission.  Denies weight loss, loss of appetite. Patient has been on iron supplementation so has had dark stools, denies diarrhea or constipation. Last bowel movement was yesterday, patient had 3 formed stools. Denies abdominal pain, hematochezia.  Patient is on Eliquis outpatient, currently on Lovenox inpatient. Denies NSAIDs, alcohol use. Sister with family history of colorectal cancer.  Colonoscopy 12/08/2019 - Three 4 to 6 mm polyps in the  sigmoid colon, in the descending colon and at the hepatic flexure, removed with a cold snare. Resected and retrieved. - Diverticulosis in the sigmoid colon, in the descending colon and in the transverse colon.Colonic lipoma. - The distal rectum and anal verge are normal on retroflexion view. Both a noncompliant and redundant colon made the exam more challenging. Complete examination performed  Endoscopy 12/08/2019 - Normal esophagus.Mildly abnormal antrum. Query early GAVE. Appeared innocent. Status post biopsy - Normal duodenal bulb and second portion of the duodenum.  Antral mucosa with hyperemia and focal microthrombi.  - Differential includes portal gastropathy and hyperplastic gastric  polyp.  - Warthin-Starry negative for Helicobacter pylori.  - No intestinal metaplasia, dysplasia or carcinoma.  CT AB and Pelvis without contrast 12/24/20 IMPRESSION: 1. Mild right hydronephrosis, secondary to a 7 x 10 mm stone in the proximal right ureter just distal to the UPJ. 2. There are intrarenal stones bilaterally 3. There is gallbladder distension without inflammatory change or calcified stone.  Past Medical History:  Diagnosis Date   Anemia    Arthritis    Ostearthritis- hips, knees, fingers   Diabetes mellitus without complication (HCC)    DVT (deep venous thrombosis) (HCC)    Dyspnea    GERD (gastroesophageal reflux disease)    Headache(784.0)    tx. Valproic acid   History of kidney stones    Hypertension    Hypothyroidism    Pulmonary embolism [facility])     Past Surgical History:  Procedure Laterality Date   ABDOMINAL HYSTERECTOMY     BILATERAL HIP ARTHROSCOPY Left    BIOPSY  12/08/2019   Procedure: BIOPSY;  Surgeon: Daneil Dolin, MD;  Location: [facility];  Service: Endoscopy;;   CATARACT EXTRACTION, BILATERAL  COLONOSCOPY N/A 12/08/2019   polyps (tubular adenoma), diverticulosis, colonic lipoma, no surveillance due to age   90 W/ [place]  Right 12/25/2020   Procedure: CYSTOSCOPY WITH RETROGRADE PYELOGRAM/URETERAL STENT PLACEMENT;  Surgeon: Janith Lima, MD;  Location: [facility] OR[facility];  Service: Urology;  Laterality: Right;   CYSTOSCOPY/URETEROSCOPY/HOLMIUM LASER/STENT PLACEMENT Right 01/15/2021   Procedure: CYSTOSCOPY/RETROGRADE/URETEROSCOPY/HOLMIUM LASER/STENT EXCHANGE;  Surgeon: Janith Lima, MD;  Location: [facility];  Service: Urology;  Laterality: Right;   ESOPHAGOGASTRODUODENOSCOPY N/A 12/08/2019   normal esophagus with 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   GIVENS CAPSULE STUDY N/A 01/13/2020   Procedure: GIVENS CAPSULE STUDY;  Surgeon: Daneil Dolin, MD;  Location: [facility];  Service: Endoscopy;  Laterality: N/A;  7:30am   MULTIPLE TOOTH EXTRACTIONS     60's   PARATHYROIDECTOMY     POLYPECTOMY  12/08/2019   Procedure: POLYPECTOMY;  Surgeon: Daneil Dolin, MD;  Location: [facility];  Service: Endoscopy;;   TOTAL HIP ARTHROPLASTY Right 10/29/2013   Procedure: RIGHT TOTAL HIP ARTHROPLASTY ANTERIOR APPROACH;  Surgeon: Mauri Pole, MD;  Location: [facility];  Service: Orthopedics;  Laterality: Right;    Prior to Admission medications   Medication Sig Start Date End Date Taking? Authorizing Provider  acetaminophen (TYLENOL) 325 MG tablet Take 2 tablets (650 mg total) by mouth every 6 (six) hours as needed for mild pain or headache (or Fever >/= 101). 04/04/19  Yes Emokpae, Courage, MD  allopurinol (ZYLOPRIM) 100 MG tablet Take 1 tablet (100 mg total) by mouth daily. 01/21/21  Yes Loman Brooklyn, FNP  apixaban (ELIQUIS) 5 MG TABS tablet Take 1 tablet (5 mg total) by mouth 2 (two) times daily. 01/08/21  Yes Minus Breeding, MD  atorvastatin (LIPITOR) 20 MG tablet Take 1 tablet (20 mg total) by mouth daily. Patient taking differently: Take 20 mg by mouth daily after breakfast. 04/02/21  Yes [name] F, FNP  Cholecalciferol (VITAMIN D-3) 125 MCG (5000 UT) TABS Take 5,000 Units by mouth  daily with breakfast.   Yes [provider]  Cyanocobalamin (VITAMIN B-12) 5000 MCG TBDP Take 5,000 mcg by mouth daily.   Yes [provider]  diclofenac Sodium (VOLTAREN) 1 % GEL APPLY 4 GRAMS TOPICALLY 4  TIMES DAILY Patient taking differently: Apply 2-4 g topically 4 (four) times daily as needed (for bilateral knee pain). 01/26/21  Yes Loman Brooklyn, FNP  EUTHYROX 50 MCG tablet TAKE 1 TABLET BY MOUTH ONCE DAILY BEFORE BREAKFAST Patient taking differently: Take 50 mcg by mouth daily before breakfast. 09/14/20  Yes Claretta Fraise, MD  furosemide (LASIX) 20 MG tablet Take 1 tablet (20 mg total) by mouth daily. 04/30/21  Yes Lendon Colonel, NP  GNP CRANBERRY EXTRACT PO Take 1 capsule by mouth in the morning.   Yes [provider]  loratadine (CLARITIN) 10 MG tablet Take 10 mg by mouth daily.   Yes [provider]  metoprolol succinate (TOPROL-XL) 25 MG 24 hr tablet Take 1 tablet (25 mg total) by mouth daily. 03/05/21  Yes Lendon Colonel, NP  oxybutynin (DITROPAN-XL) 10 MG 24 hr tablet Take 1 tablet (10 mg total) by mouth at bedtime. 11/23/20  Yes Loman Brooklyn, FNP  pantoprazole (PROTONIX) 40 MG tablet Take 1 tablet (40 mg total) by mouth 2 (two) times daily before a meal. 11/16/20  Yes [name] F, FNP  SYSTANE HYDRATION PF 0.4-0.3 % SOLN Place 1 drop into both eyes 3 (three) times  daily as needed (for dryness).   Yes [provider]  ONE TOUCH ULTRA TEST test strip USE UP TO FOUR TIMES DAILY AS DIRECTED Patient taking differently: 1 each by Other route 4 (four) times daily. 10/04/17   Terald Sleeper, PA-C    Scheduled Meds:  allopurinol  100 mg Oral Daily   atorvastatin  20 mg Oral Daily   enoxaparin (LOVENOX) injection  90 mg Subcutaneous Q12H   furosemide  20 mg Oral Daily   insulin aspart  0-20 Units Subcutaneous TID WC   insulin aspart  0-5 Units Subcutaneous QHS   levothyroxine  50 mcg Oral QAC breakfast   metoprolol succinate   25 mg Oral Daily   oxybutynin  10 mg Oral QHS   pantoprazole  40 mg Oral BID AC   vitamin B-12  1,000 mcg Oral Daily   Continuous Infusions: PRN Meds:.diclofenac Sodium, ondansetron **OR** ondansetron (ZOFRAN) IV, polyvinyl alcohol  Allergies as of 05/01/2021   (No Known Allergies)    Family History  Problem Relation Age of Onset   Colitis Sister 27       alive   Cancer Sister 50       colon   Cancer Mother 90       uterine   Heart disease Father 75       heart failure   Heart attack Brother 77   Healthy Daughter    Healthy Son    Pulmonary embolism Brother 10   Heart disease Brother    Healthy Son    Healthy Son     Social History   Socioeconomic History   Marital status: Widowed    Spouse name: Not on file   Number of children: 4   Years of education: 12   Highest education level: High school graduate  Occupational History   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 She has four adult children and one son lives with her.    Social Determinants of Health   Financial Resource Strain: Low Risk    Difficulty of Paying Living Expenses: Not hard at all  Food Insecurity: No Food Insecurity   Worried About Charity fundraiser in the Last Year: Never true   [place] in the Last Year: Never true  Transportation Needs: No Transportation Needs   Lack of Transportation (Medical): No   Lack of Transportation (Non-Medical): No  Physical Activity: Insufficiently Active   Days of Exercise per Week: 7 days   Minutes of Exercise per Session: 20 min  Stress: No Stress Concern Present   Feeling of Stress : Not  at all  Social Connections: Moderately Integrated   Frequency of Communication with Friends and Family: More than three times a week   Frequency of Social Gatherings with Friends and Family: More than three times a week   Attends Religious Services: More than 4 times per year   Active Member of [employer] or Organizations: Yes   Attends Archivist Meetings: More than 4 times per year   Marital  Status: Widowed  Human resources officer Violence: Not At Risk   Fear of Current or Ex-Partner: No   Emotionally Abused: No   Physically Abused: No   Sexually Abused: No    Review of Systems:  Review of Systems  Constitutional:  Positive for malaise/fatigue. Negative for chills, fever and weight loss.  HENT:  Negative for hearing loss.   Respiratory:  Positive for shortness of breath. Negative for cough, hemoptysis and sputum production.   Cardiovascular:  Negative for chest pain and leg swelling.  Gastrointestinal:  Positive for melena (Patient on iron). Negative for abdominal pain, blood in stool, constipation, diarrhea, heartburn, nausea and vomiting.  Musculoskeletal:  Negative for falls.  Skin:  Negative for rash.  Neurological:  Negative for dizziness.  Psychiatric/Behavioral:  Negative for depression and memory loss.     Physical Exam: Vital signs: Vitals:   05/02/21 2026 05/03/21 0439  BP: (!) 146/55 (!) 159/60  Pulse: (!) 57 (!) 55  Resp: 18 18  Temp: 98.9 F (37.2 C) 98 F (36.7 C)  SpO2: 98% 93%   Last BM Date: 05/02/21 Physical Exam Constitutional:      General: She is not in acute distress.    Appearance: Normal appearance. She is obese.  Eyes:     General: No scleral icterus.    Comments: Pale conjunctivae  Cardiovascular:     Rate and Rhythm: Regular rhythm. Bradycardia present.     Pulses: Normal pulses.  Pulmonary:     Effort: Pulmonary effort is normal.     Breath sounds: Normal breath sounds.  Abdominal:     General: Abdomen is protuberant. Bowel sounds  are normal. There is no distension.     Palpations: Abdomen is soft. There is no hepatomegaly.     Tenderness: There is no abdominal tenderness. There is no guarding or rebound.  Skin:    Coloration: Skin is not jaundiced.  Neurological:     General: No focal deficit present.     Mental Status: She is alert and oriented to person, place, and time.  Psychiatric:        Mood and Affect: Mood normal.        Behavior: Behavior normal.     GI:  Lab Results: Recent Labs    05/01/21 1539 05/02/21 0511 05/03/21 0458  WBC 4.9 5.0 4.7  HGB 7.3* 7.2* 7.4*  HCT 24.0* 23.0* 23.6*  PLT 249 200 199   BMET Recent Labs    05/01/21 1539 05/02/21 0511 05/03/21 0458  NA 139 141 138  K 4.1 3.7 3.5  CL 106 109 109  CO2 25 24 26   GLUCOSE 113* 102* 106*  BUN 29* 27* 24*  CREATININE 1.49* 1.19* 1.25*  CALCIUM 11.1* 10.5* 10.5*   LFT Recent Labs    05/02/21 0511  PROT 5.7*  ALBUMIN 3.4*  AST 12*  ALT 11  ALKPHOS 61  BILITOT 0.5   PT/INR No results for input(s): LABPROT, INR in the last 72 hours.  Studies/Results: No results found.  Impression  Symptomatic Anemia HGB 7.0 (Baseline 9.5 02/24/2021) Iron 22, ferritin 6 (02/24/2021 Iron 360, Ferritin 41). BUN 33, Cr 1.50 at baseline Previous work-up May 2021 with colonoscopy-unremarkable, endoscopy with possible early GAVE and capsule endoscopy with scattered small bowel erosions, abnormal small bowel with edema, inflammation/erosions  Atrial Fibrillation and history of DVT/PE On Eliquis outpatient, currently transitioned to Lovenox inpatient.   Plan Patient with previous unrevealing IDA work-up, no overt GI bleeding Currently with iron deficiency anemia  worsening Colonoscopy unremarkable previously possible upper GI versus small bowel source however unrevealing endoscopy May 2021 without any current symptoms. Continue Protonix twice daily Discussed with patient and will proceed with capsule endoscopy for evaluation Continue  to monitor H&H with transfusion as needed to maintain hemoglobin greater than 7.     LOS: 2 days   Vladimir Crofts  PA-C 05/03/2021, 8:08 AM  Contact #  [phone]

## 2021-05-03 NOTE — Progress Notes (Signed)
PROGRESS NOTE  [name] [name]  DOB: April 24, 1941  PCP: [name] Brooklyn, FNP WLS:[id number]  DOA: 05/01/2021  LOS: 2 days  Hospital Day: 3   Chief Complaint  Patient presents with   Abnormal Lab    Brief narrative: [name] [name] is a 80 y.o. female with PMH significant for obesity, DM2, HTN, A. fib, DVT/PE on Eliquis, hypothyroidism, GERD, osteoarthritis and history of GI bleeding. Patient presented to the ED on 10/8 with complaint of progressively worsening dyspnea, generalized weakness.  Earlier in the day, she was seen at cardiologist office.  Labs were drawn as part of preop evaluation for planned TEE on October 14.  Labs showed hemoglobin low at 7 which is less than 9 in July 2022.  Patient was hence directed to ED.  In the ED, patient was afebrile, blood pressure elevated to 185/79, breathing on room air Repeated labs showed hemoglobin at 7.3, WBC normal, platelet 249, anemia panel with ferritin low at 6, creatinine elevated to 1.49. Patient was given a unit of PRBC transfusion and admitted to hospitalist service for symptomatic anemia. See below for details  Subjective: Patient was seen and examined this morning.  Sitting up in bed.  Not in distress.  Hemoglobin this morning stable at 7.2.  However patient had episode of black stool yesterday.  GI consultation called this morning.  Assessment/Plan: Acute on chronic iron deficiency anemia -Patient states that she was noted to be anemic in 2021 and required 2 units of PRBC transfusion.  She was hospitalized at [facility] at that time and underwent EGD, colonoscopy and capsule endoscopy however the source of bleeding was not identified. -She has been on Eliquis since 2019 for DVT PE and A. fib.  The dose of her Eliquis was reduced to 2.5 mg twice daily and she tolerated it for several months. -She states that her cardiologist Dr. Percival Spanish increased her Eliquis to 5 mg twice daily in June 2022 after which she has  been dropping hemoglobin without obvious bleeding.  Sometimes in the last few months, her oral iron supplementation was also stopped. -On chart review I noted that she had a ferritin level of 149 in June which dropped to 41 in August and is now at 6. -Baseline hemoglobin 9.5 in August, presented with a low hemoglobin of 7.3.  Received 1 unit of PRBC, hemoglobin at 7.4 this morning.  Passed FOBT positive black stool yesterday. -GI consulted.  Noted a plan to do capsule endoscopy -Continue PPI. -Received 1 dose of IV Feraheme yesterday.  We will repeat another dose in 3 days if she remains hospitalized.  Oral iron at discharge -I also noted the patient takes 5000 MCG of vitamin B12 daily.  Her vitamin B12 level has significantly improved from 122 to over 6000 in 2 months.  I reduced the dose of vitamin B12 to 1000 MCG daily. Recent Labs    05/04/20 0000 05/05/20 0000 10/20/20 1019 12/24/20 1719 12/30/20 0436 12/30/20 0653 01/05/21 1641 02/24/21 1144 04/30/21 1139 05/01/21 1539 05/02/21 0511 05/03/21 0458  HGB  --    < > 13.7   < >  --  11.1*   < > 9.5* 7.0* 7.3* 7.2* 7.4*  MCV  --    < > 93   < >  --  93.9   < > 92 88 90.9 88.8 89.1  VITAMINB12  --   --   --   --   --  763  --  122*  --  Oenone.Nipple*  --   --   FOLATE  --   --   --   --  17.2  --   --  8.2  --  18.3  --   --   FERRITIN 42  --  54  --   --  149  --  41  --  6*  --   --   TIBC >736*  --  350  --   --  282  --  <377  --  439  --   --   IRON 36  --  98  --   --  23*  --  360*  --  22*  --   --   RETICCTPCT  --   --   --   --   --  1.0  --  3.3*  --  2.9  --   --    < > = values in this interval not displayed.   Essential hypertension Chronic diastolic CHF -Home meds include Toprol 25 mg daily, Lasix 20 mg daily. -Last echocardiogram from September 2022 showed EF of 55 to 60% with grade 3 diastolic dysfunction, mildly elevated pulmonary systolic pressure to 38. -Continue Toprol and Lasix.  Severe mitral regurgitation -Echo  from September 2 102 showed severe mitral regurgitation.  Patient was seen by cardiology and plan for outpatient TEE scheduled for 10/14.  History of valvular A. Fib -Currently rate controlled A. fib.   -Probably related to severe mitral regurgitation -Eliquis on hold for now.  Last dose on the morning of 10/8.  Currently on full dose Lovenox.  History of DVT/PE -Right leg DVT and PE in 2019.  Type 2 diabetes mellitus -A1c 5.7 on June 2022 -Does not seem to be on any antidiabetic regimen at home. -Currently on sliding scales with Accu-Cheks -Blood sugar level remains controlled this morning. Recent Labs  Lab 05/02/21 0808 05/02/21 1218 05/02/21 1718 05/02/21 2136 05/03/21 0803  GLUCAP 93 98 101* 114* 99   Hyperlipidemia -Continue Lipitor  Hypothyroidism -Continue Synthroid  Gout -Allopurinol  Mobility: Encourage ambulation.  Uses a walker at home. Code Status:   Code Status: Full Code  Nutritional status: Body mass index is 35.23 kg/m.     Diet:  Diet Order             Diet NPO time specified  Diet effective now                  DVT prophylaxis:  SCDs Start: 05/01/21 2020   Antimicrobials: None Fluid: None Consultants: None so far Family Communication: None at bedside  Status is: Inpatient  Remains inpatient appropriate because: Needs anemia work-up  Dispo: The patient is from: Home              Anticipated d/c is to: Home in next 1-2 days              Patient currently is not medically stable to d/c.   Difficult to place patient No     Infusions:    Scheduled Meds:  allopurinol  100 mg Oral Daily   atorvastatin  20 mg Oral Daily   enoxaparin (LOVENOX) injection  90 mg Subcutaneous Q12H   furosemide  20 mg Oral Daily   insulin aspart  0-20 Units Subcutaneous TID WC   insulin aspart  0-5 Units Subcutaneous QHS   levothyroxine  50 mcg Oral QAC breakfast   metoprolol succinate  25 mg Oral Daily  oxybutynin  10 mg Oral QHS    pantoprazole  40 mg Oral BID AC   vitamin B-12  1,000 mcg Oral Daily    Antimicrobials: Anti-infectives (From admission, onward)    None       PRN meds: diclofenac Sodium, ondansetron **OR** ondansetron (ZOFRAN) IV, polyvinyl alcohol   Objective: Vitals:   05/02/21 2026 05/03/21 0439  BP: (!) 146/55 (!) 159/60  Pulse: (!) 57 (!) 55  Resp: 18 18  Temp: 98.9 F (37.2 C) 98 F (36.7 C)  SpO2: 98% 93%    Intake/Output Summary (Last 24 hours) at 05/03/2021 0959 Last data filed at 05/02/2021 1126 Gross per 24 hour  Intake 340 ml  Output --  Net 340 ml   Filed Weights   05/01/21 1438 05/01/21 2129  Weight: 88 kg 90.2 kg   Weight change:  Body mass index is 35.23 kg/m.   Physical Exam: General exam: Pleasant, elderly Caucasian female.  Not in physical distress Skin: No rashes, lesions or ulcers. HEENT: Atraumatic, normocephalic, no obvious bleeding Lungs: Clear to auscultation bilaterally CVS: Rate controlled A. fib GI/Abd soft, nontender, nondistended, bowel sound present CNS: Alert, awake, oriented x3 Psychiatry: Mood appropriate Extremities: Trace bilateral pedal edema, no calf tenderness  Data Review: I have personally reviewed the laboratory data and studies available.  Recent Labs  Lab 04/30/21 1139 05/01/21 1539 05/02/21 0511 05/03/21 0458  WBC 4.6 4.9 5.0 4.7  NEUTROABS  --  2.8  --  2.1  HGB 7.0* 7.3* 7.2* 7.4*  HCT 22.2* 24.0* 23.0* 23.6*  MCV 88 90.9 88.8 89.1  PLT 228 249 200 199   Recent Labs  Lab 04/30/21 1139 05/01/21 1539 05/02/21 0511 05/03/21 0458  NA 141 139 141 138  K 4.3 4.1 3.7 3.5  CL 104 106 109 109  CO2 24 25 24 26   GLUCOSE 110* 113* 102* 106*  BUN 28* 29* 27* 24*  CREATININE 1.30* 1.49* 1.19* 1.25*  CALCIUM 10.8* 11.1* 10.5* 10.5*    F/u labs ordered Unresulted Labs (From admission, onward)     Start     Ordered   05/03/21 0500  CBC with Differential/Platelet  Daily,   R     Question:  Specimen collection method   Answer:  Lab=Lab collect   05/02/21 0842   05/03/21 4132  Basic metabolic panel  Daily,   R     Question:  Specimen collection method  Answer:  Lab=Lab collect   05/02/21 4401            Signed, Terrilee Croak, MD [facility] 05/03/2021

## 2021-05-04 ENCOUNTER — Encounter (HOSPITAL_COMMUNITY): Payer: Self-pay | Admitting: Internal Medicine

## 2021-05-04 ENCOUNTER — Encounter (HOSPITAL_COMMUNITY)
Admission: EM | Disposition: A | Discharge status: Home / Self Care | Payer: Self-pay | Source: Home / Self Care | Attending: Internal Medicine

## 2021-05-04 HISTORY — PX: GIVENS CAPSULE STUDY: SHX5432

## 2021-05-04 LAB — CBC WITH DIFFERENTIAL/PLATELET
Abs Immature Granulocytes: 0.02 10*3/uL (ref 0.00–0.07)
Basophils Absolute: 0 10*3/uL (ref 0.0–0.1)
Basophils Relative: 1 %
Eosinophils Absolute: 0.2 10*3/uL (ref 0.0–0.5)
Eosinophils Relative: 3 %
HCT: 25.6 % — ABNORMAL LOW (ref 36.0–46.0)
Hemoglobin: 7.7 g/dL — ABNORMAL LOW (ref 12.0–15.0)
Immature Granulocytes: 0 %
Lymphocytes Relative: 42 %
Lymphs Abs: 2.3 10*3/uL (ref 0.7–4.0)
MCH: 27.3 pg (ref 26.0–34.0)
MCHC: 30.1 g/dL (ref 30.0–36.0)
MCV: 90.8 fL (ref 80.0–100.0)
Monocytes Absolute: 0.6 10*3/uL (ref 0.1–1.0)
Monocytes Relative: 11 %
Neutro Abs: 2.4 10*3/uL (ref 1.7–7.7)
Neutrophils Relative %: 43 %
Platelets: 236 10*3/uL (ref 150–400)
RBC: 2.82 MIL/uL — ABNORMAL LOW (ref 3.87–5.11)
RDW: 15.5 % (ref 11.5–15.5)
WBC: 5.6 10*3/uL (ref 4.0–10.5)
nRBC: 0 % (ref 0.0–0.2)

## 2021-05-04 LAB — BASIC METABOLIC PANEL
Anion gap: 7 (ref 5–15)
BUN: 25 mg/dL — ABNORMAL HIGH (ref 8–23)
CO2: 25 mmol/L (ref 22–32)
Calcium: 10.8 mg/dL — ABNORMAL HIGH (ref 8.9–10.3)
Chloride: 106 mmol/L (ref 98–111)
Creatinine, Ser: 1.17 mg/dL — ABNORMAL HIGH (ref 0.44–1.00)
GFR, Estimated: 47 mL/min — ABNORMAL LOW (ref >60)
Glucose, Bld: 106 mg/dL — ABNORMAL HIGH (ref 70–99)
Potassium: 3.7 mmol/L (ref 3.5–5.1)
Sodium: 138 mmol/L (ref 135–145)

## 2021-05-04 LAB — GLUCOSE, CAPILLARY
Glucose-Capillary: 91 mg/dL (ref 70–99)
Glucose-Capillary: 95 mg/dL (ref 70–99)

## 2021-05-04 SURGERY — IMAGING PROCEDURE, GI TRACT, INTRALUMINAL, VIA CAPSULE
Anesthesia: LOCAL

## 2021-05-04 MED ORDER — SODIUM CHLORIDE 0.9 % IV SOLN: 510.0000 mg | Freq: Once | INTRAVENOUS | Status: DC

## 2021-05-04 MED ORDER — CYANOCOBALAMIN 1000 MCG PO TABS: 1000.0000 ug | ORAL_TABLET | Freq: Every day | ORAL | 2 refills | Status: AC

## 2021-05-04 MED ORDER — SODIUM CHLORIDE 0.9 % IV SOLN: INTRAVENOUS | Status: DC

## 2021-05-04 MED ORDER — AMLODIPINE BESYLATE 5 MG PO TABS
2.5000 mg | ORAL_TABLET | Freq: Every day | ORAL | Status: DC
  Administered 2021-05-04: 2.5 mg via ORAL
  Filled 2021-05-04: qty 1

## 2021-05-04 MED ORDER — FERROUS SULFATE 325 (65 FE) MG PO TBEC: 325.0000 mg | DELAYED_RELEASE_TABLET | Freq: Two times a day (BID) | ORAL | 2 refills | Status: DC

## 2021-05-04 MED ORDER — AMLODIPINE BESYLATE 2.5 MG PO TABS: 2.5000 mg | ORAL_TABLET | Freq: Every day | ORAL | 2 refills | Status: DC

## 2021-05-04 SURGICAL SUPPLY — 1 items: TOWEL COTTON PACK 4EA (MISCELLANEOUS) ×4 IMPLANT

## 2021-05-04 NOTE — Care Management Important Message (Signed)
Important Message  Patient Details IM Letter given to the Patient. Name: [name] [name] MRN: [id number] Date of Birth: Mar 07, 1941   Medicare Important Message Given:  Yes     Kerin Salen 05/04/2021, 1:31 PM

## 2021-05-04 NOTE — Progress Notes (Signed)
[facility] Progress Note  [name] [name] 80 y.o. [birth year]  CC:  Symptomatic anemia   Subjective: Patient states she is feeling better with 1 PRBC and iron infusion.  She has had several bowel movements.  Most recently this morning is brown, did have 1 black stool yesterday. Patient to capsule endoscopy this morning which she states tolerated well. Patient denies nausea, vomiting, abdominal pain, dysphagia.  ROS : Review of Systems  Constitutional:  Negative for chills, fever and weight loss.  Respiratory:  Positive for shortness of breath (at baseline).   Cardiovascular:  Negative for chest pain and leg swelling.  Gastrointestinal:  Positive for melena (one episode). Negative for abdominal pain, blood in stool, constipation, diarrhea, heartburn, nausea and vomiting.  Musculoskeletal:  Negative for falls.  Neurological:  Negative for dizziness.  Psychiatric/Behavioral:  Negative for memory loss.      Objective: Vital signs in last 24 hours: Vitals:   05/03/21 2107 05/04/21 0444  BP: (!) 177/67 (!) 177/83  Pulse: 61 60  Resp: 20 18  Temp: 98.6 F (37 C) 99.3 F (37.4 C)  SpO2: 99% 96%    Physical Exam: PE: Lying comfortably in bed, no acute distress GENERAL: Pallor and no complete distress ABDOMEN: Soft, nondistended, nontender obese abdomen EXTREMITIES: No acute  Lab Results: Recent Labs    05/03/21 0458 05/04/21 0421  NA 138 138  K 3.5 3.7  CL 109 106  CO2 26 25  GLUCOSE 106* 106*  BUN 24* 25*  CREATININE 1.25* 1.17*  CALCIUM 10.5* 10.8*   Recent Labs    05/02/21 0511  AST 12*  ALT 11  ALKPHOS 61  BILITOT 0.5  PROT 5.7*  ALBUMIN 3.4*   Recent Labs    05/03/21 0458 05/04/21 0421  WBC 4.7 5.6  NEUTROABS 2.1 2.4  HGB 7.4* 7.7*  HCT 23.6* 25.6*  MCV 89.1 90.8  PLT 199 236   No results for input(s): LABPROT, INR in the last 72 hours.  Lab Results: Results for orders placed or performed during the hospital encounter of  05/01/21 (from the past 48 hour(s))  Glucose, capillary     Status: None   Collection Time: 05/02/21 12:18 PM  Result Value Ref Range   Glucose-Capillary 98 70 - 99 mg/dL    Comment: Glucose reference range applies only to samples taken after fasting for at least 8 hours.  Occult blood card to lab, stool     Status: Abnormal   Collection Time: 05/02/21  4:25 PM  Result Value Ref Range   Fecal Occult Bld POSITIVE (A) NEGATIVE    Comment: Performed at [facility], [place] [address] [place], [place] [address]  Glucose, capillary     Status: Abnormal   Collection Time: 05/02/21  5:18 PM  Result Value Ref Range   Glucose-Capillary 101 (H) 70 - 99 mg/dL    Comment: Glucose reference range applies only to samples taken after fasting for at least 8 hours.  Glucose, capillary     Status: Abnormal   Collection Time: 05/02/21  9:36 PM  Result Value Ref Range   Glucose-Capillary 114 (H) 70 - 99 mg/dL    Comment: Glucose reference range applies only to samples taken after fasting for at least 8 hours.  CBC with Differential/Platelet     Status: Abnormal   Collection Time: 05/03/21  4:58 AM  Result Value Ref Range   WBC 4.7 4.0 - 10.5 K/uL   RBC 2.65 (L) 3.87 - 5.11 MIL/uL  Hemoglobin 7.4 (L) 12.0 - 15.0 g/dL   HCT 23.6 (L) 36.0 - 46.0 %   MCV 89.1 80.0 - 100.0 fL   MCH 27.9 26.0 - 34.0 pg   MCHC 31.4 30.0 - 36.0 g/dL   RDW 15.4 11.5 - 15.5 %   Platelets 199 150 - 400 K/uL   nRBC 0.0 0.0 - 0.2 %   Neutrophils Relative % 47 %   Neutro Abs 2.1 1.7 - 7.7 K/uL   Lymphocytes Relative 35 %   Lymphs Abs 1.6 0.7 - 4.0 K/uL   Monocytes Relative 14 %   Monocytes Absolute 0.7 0.1 - 1.0 K/uL   Eosinophils Relative 4 %   Eosinophils Absolute 0.2 0.0 - 0.5 K/uL   Basophils Relative 0 %   Basophils Absolute 0.0 0.0 - 0.1 K/uL   Immature Granulocytes 0 %   Abs Immature Granulocytes 0.02 0.00 - 0.07 K/uL    Comment: Performed at [facility], [place] [address]  [place], [place] [address]  Basic metabolic panel     Status: Abnormal   Collection Time: 05/03/21  4:58 AM  Result Value Ref Range   Sodium 138 135 - 145 mmol/L   Potassium 3.5 3.5 - 5.1 mmol/L   Chloride 109 98 - 111 mmol/L   CO2 26 22 - 32 mmol/L   Glucose, Bld 106 (H) 70 - 99 mg/dL    Comment: Glucose reference range applies only to samples taken after fasting for at least 8 hours.   BUN 24 (H) 8 - 23 mg/dL   Creatinine, Ser 1.25 (H) 0.44 - 1.00 mg/dL   Calcium 10.5 (H) 8.9 - 10.3 mg/dL   GFR, Estimated 44 (L) >60 mL/min    Comment: (NOTE) Calculated using the CKD-EPI Creatinine Equation (2021)    Anion gap 3 (L) 5 - 15    Comment: Performed at [facility], [place] [address] [place], [place] [address]  Glucose, capillary     Status: None   Collection Time: 05/03/21  8:03 AM  Result Value Ref Range   Glucose-Capillary 99 70 - 99 mg/dL    Comment: Glucose reference range applies only to samples taken after fasting for at least 8 hours.  Glucose, capillary     Status: None   Collection Time: 05/03/21 12:32 PM  Result Value Ref Range   Glucose-Capillary 92 70 - 99 mg/dL    Comment: Glucose reference range applies only to samples taken after fasting for at least 8 hours.  Glucose, capillary     Status: None   Collection Time: 05/03/21  4:42 PM  Result Value Ref Range   Glucose-Capillary 97 70 - 99 mg/dL    Comment: Glucose reference range applies only to samples taken after fasting for at least 8 hours.  Glucose, capillary     Status: Abnormal   Collection Time: 05/03/21  9:04 PM  Result Value Ref Range   Glucose-Capillary 114 (H) 70 - 99 mg/dL    Comment: Glucose reference range applies only to samples taken after fasting for at least 8 hours.  CBC with Differential/Platelet     Status: Abnormal   Collection Time: 05/04/21  4:21 AM  Result Value Ref Range   WBC 5.6 4.0 - 10.5 K/uL   RBC 2.82 (L) 3.87 - 5.11 MIL/uL   Hemoglobin 7.7 (L) 12.0 - 15.0 g/dL   HCT  25.6 (L) 36.0 - 46.0 %   MCV 90.8 80.0 - 100.0 fL   MCH 27.3  26.0 - 34.0 pg   MCHC 30.1 30.0 - 36.0 g/dL   RDW 15.5 11.5 - 15.5 %   Platelets 236 150 - 400 K/uL   nRBC 0.0 0.0 - 0.2 %   Neutrophils Relative % 43 %   Neutro Abs 2.4 1.7 - 7.7 K/uL   Lymphocytes Relative 42 %   Lymphs Abs 2.3 0.7 - 4.0 K/uL   Monocytes Relative 11 %   Monocytes Absolute 0.6 0.1 - 1.0 K/uL   Eosinophils Relative 3 %   Eosinophils Absolute 0.2 0.0 - 0.5 K/uL   Basophils Relative 1 %   Basophils Absolute 0.0 0.0 - 0.1 K/uL   Immature Granulocytes 0 %   Abs Immature Granulocytes 0.02 0.00 - 0.07 K/uL    Comment: Performed at [facility], [place] [address], [place], [place] [address]  Basic metabolic panel     Status: Abnormal   Collection Time: 05/04/21  4:21 AM  Result Value Ref Range   Sodium 138 135 - 145 mmol/L   Potassium 3.7 3.5 - 5.1 mmol/L   Chloride 106 98 - 111 mmol/L   CO2 25 22 - 32 mmol/L   Glucose, Bld 106 (H) 70 - 99 mg/dL    Comment: Glucose reference range applies only to samples taken after fasting for at least 8 hours.   BUN 25 (H) 8 - 23 mg/dL   Creatinine, Ser 1.17 (H) 0.44 - 1.00 mg/dL   Calcium 10.8 (H) 8.9 - 10.3 mg/dL   GFR, Estimated 47 (L) >60 mL/min    Comment: (NOTE) Calculated using the CKD-EPI Creatinine Equation (2021)    Anion gap 7 5 - 15    Comment: Performed at [facility], [place] [address] [place], [place] [address]  Glucose, capillary     Status: None   Collection Time: 05/04/21  9:45 AM  Result Value Ref Range   Glucose-Capillary 91 70 - 99 mg/dL    Comment: Glucose reference range applies only to samples taken after fasting for at least 8 hours.    Assessment/Plan: Symptomatic anemia with patient on Eliquis outpatient HGB 7.7 (7.0)  MCV 90.8 Platelets 236 BUN 7.7 Cr 1.17 GFR 47  Baseline HGB 9.5 02/2021 Patient has had 1 unit of PRBC and 1 iron transfusion.  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 Capsule endoscopy with scattered small bowel erosions, abnormal small bowel also noted with edema, inflammation/erosion.  Pending capsule endoscopy results or if the patient continue to have melena/worsening anemia while in the hospital will consider endoscopy inpatient or outpatient.  At this time continue supportive care. Continue Protonix 40 mg IV BID. Continue daily or twice daily CBC with transfusion as needed to maintain Hgb >7.    [facility] will follow.    Vladimir Crofts PA-C 05/04/2021, 9:57 AM

## 2021-05-04 NOTE — Discharge Summary (Signed)
Physician Discharge Summary  [name] [name] RWE:[id number] DOB: [birth year] DOA: 05/01/2021  PCP: Loman Brooklyn, FNP  Admit date: 05/01/2021 Discharge date: 05/04/2021  Admitted From: Home Discharge disposition: Home   Code Status: Full Code   Discharge Diagnosis:   Principal Problem:   Symptomatic anemia Active Problems:   Morbid obesity ([place])   Essential hypertension   Hypothyroidism   Hyperlipidemia associated with type 2 diabetes mellitus ([place])   Gastroesophageal reflux disease without esophagitis   Unprovoked DVT and Unprovoked Pulmonary Embolism -Dxed 03/2019   Iron deficiency anemia due to chronic blood loss   Acute combined systolic and diastolic heart failure [facility])    Chief Complaint  Patient presents with   Abnormal Lab    Brief narrative: [name] [name] is a 80 y.o. female with PMH significant for obesity, DM2, HTN, A. fib, DVT/PE on Eliquis, hypothyroidism, GERD, osteoarthritis and history of GI bleeding. Patient presented to the ED on 10/8 with complaint of progressively worsening dyspnea, generalized weakness.  Earlier in the day, she was seen at cardiologist office.  Labs were drawn as part of preop evaluation for planned TEE on October 14.  Labs showed hemoglobin low at 7 which is less than 9 in July 2022.  Patient was hence directed to ED.  In the ED, patient was afebrile, blood pressure elevated to 185/79, breathing on room air Repeated labs showed hemoglobin at 7.3, WBC normal, platelet 249, anemia panel with ferritin low at 6, creatinine elevated to 1.49. Patient was given a unit of PRBC transfusion and admitted to hospitalist service for symptomatic anemia. See below for details  Subjective: Patient was seen and examined this morning.  Sitting up in chair.  Not in distress.  No new symptoms.  Completed capsule study today.  Hospital course: Acute on chronic GI bleeding -Patient states that she was noted to be anemic in 2021 and required 2  units of PRBC transfusion.  She was hospitalized at [facility] at that time and underwent EGD, colonoscopy and capsule endoscopy however the source of bleeding was not identified. -She has been on Eliquis since 2019 for DVT PE and A. fib.  The dose of her Eliquis was reduced to 2.5 mg twice daily and she tolerated it for several months. -She states that her cardiologist Dr. Percival Spanish increased her Eliquis to 5 mg twice daily in June 2022 after which she has been dropping hemoglobin without obvious bleeding.  Sometimes in the last few months, her oral iron supplementation was also stopped. -On chart review I noted that she had a ferritin level of 149 in June which dropped to 41 in August and is now at 6. -Baseline hemoglobin 9.5 in August, presented with a low hemoglobin of 7.3.  Received 1 unit of PRBC, hemoglobin at 7.7 this morning.  Had FOBT positive black stool. -GI consulted.  Patient completed capsule study.  Per GI, she can go home today.  She will be called with results tomorrow. -Continue PPI.  Severe iron deficiency anemia -Received 1 dose of IV Feraheme yesterday.  We will repeat another dose in 3 days if she remains hospitalized.  Oral iron at discharge -I also noted the patient takes 5000 MCG of vitamin B12 daily.  Her vitamin B12 level has significantly improved from 122 to over 6000 in 2 months.  I reduced the dose of vitamin B12 to 1000 MCG daily. Recent Labs    10/20/20 1019 12/24/20 1719 12/30/20 0436 12/30/20 0653 01/05/21 1641 02/24/21 1144  04/30/21 1139 05/01/21 1539 05/02/21 0511 05/03/21 0458 05/04/21 0421  HGB 13.7   < >  --  11.1*   < > 9.5* 7.0* 7.3* 7.2* 7.4* 7.7*  MCV 93   < >  --  93.9   < > 92 88 90.9 88.8 89.1 90.8  VITAMINB12  --   --   --  763  --  122*  --  6,739*  --   --   --   FOLATE  --   --  17.2  --   --  8.2  --  18.3  --   --   --   FERRITIN 54  --   --  149  --  41  --  6*  --   --   --   TIBC 350  --   --  282  --  <377  --  439  --   --    --   IRON 98  --   --  23*  --  360*  --  22*  --   --   --   RETICCTPCT  --   --   --  1.0  --  3.3*  --  2.9  --   --   --    < > = values in this interval not displayed.   Essential hypertension Chronic diastolic CHF -Home meds include Toprol 25 mg daily, Lasix 20 mg daily. -Last echocardiogram from September 2022 showed EF of 55 to 60% with grade 3 diastolic dysfunction, mildly elevated pulmonary systolic pressure to 38. -Currently continued Toprol and Lasix.  Her blood pressure this morning was elevated to 170s.  I added amlodipine 2.5 mg daily.  Patient agreeable to the plan.  Severe mitral regurgitation -Echo from September 2 102 showed severe mitral regurgitation.  Patient was seen by cardiology and planned for outpatient TEE scheduled for 10/14.  History of valvular A. Fib -Currently rate controlled A. fib.   -Probably related to severe mitral regurgitation -Eliquis to resume post discharge.  History of DVT/PE -Right leg DVT and PE in 2019.  Type 2 diabetes mellitus -A1c 5.7 on June 2022 -Does not seem to be on any antidiabetic regimen at home. -Currently on sliding scales with Accu-Cheks -Blood sugar level remains controlled at this time.  Hyperlipidemia -Continue Lipitor  Hypothyroidism -Continue Synthroid  Gout -Allopurinol   Allergies as of 05/04/2021   No Known Allergies      Medication List     STOP taking these medications    Vitamin B-12 5000 MCG Tbdp Replaced by: cyanocobalamin 1000 MCG tablet       TAKE these medications    acetaminophen 325 MG tablet Commonly known as: TYLENOL Take 2 tablets (650 mg total) by mouth every 6 (six) hours as needed for mild pain or headache (or Fever >/= 101).   allopurinol 100 MG tablet Commonly known as: ZYLOPRIM Take 1 tablet (100 mg total) by mouth daily.   amLODipine 2.5 MG tablet Commonly known as: NORVASC Take 1 tablet (2.5 mg total) by mouth daily. Start taking on: May 05, 2021    apixaban 5 MG Tabs tablet Commonly known as: ELIQUIS Take 1 tablet (5 mg total) by mouth 2 (two) times daily.   atorvastatin 20 MG tablet Commonly known as: LIPITOR Take 1 tablet (20 mg total) by mouth daily. What changed: when to take this   cyanocobalamin 1000 MCG tablet Take 1 tablet (1,000 mcg total) by mouth daily.  Start taking on: May 05, 2021 Replaces: Vitamin B-12 5000 MCG Tbdp   diclofenac Sodium 1 % Gel Commonly known as: VOLTAREN APPLY 4 GRAMS TOPICALLY 4  TIMES DAILY What changed: See the new instructions.   Euthyrox 50 MCG tablet Generic drug: levothyroxine TAKE 1 TABLET BY MOUTH ONCE DAILY BEFORE BREAKFAST What changed: how much to take   ferrous sulfate 325 (65 FE) MG EC tablet Take 1 tablet (325 mg total) by mouth 2 (two) times daily.   furosemide 20 MG tablet Commonly known as: Lasix Take 1 tablet (20 mg total) by mouth daily.   GNP CRANBERRY EXTRACT PO Take 1 capsule by mouth in the morning.   loratadine 10 MG tablet Commonly known as: CLARITIN Take 10 mg by mouth daily.   metoprolol succinate 25 MG 24 hr tablet Commonly known as: TOPROL-XL Take 1 tablet (25 mg total) by mouth daily.   ONE TOUCH ULTRA TEST test strip Generic drug: glucose blood USE UP TO FOUR TIMES DAILY AS DIRECTED What changed: See the new instructions.   oxybutynin 10 MG 24 hr tablet Commonly known as: DITROPAN-XL Take 1 tablet (10 mg total) by mouth at bedtime.   pantoprazole 40 MG tablet Commonly known as: PROTONIX Take 1 tablet (40 mg total) by mouth 2 (two) times daily before a meal.   Systane Hydration PF 0.4-0.3 % Soln Generic drug: Polyethyl Glyc-Propyl Glyc PF Place 1 drop into both eyes 3 (three) times daily as needed (for dryness).   Vitamin D-3 125 MCG (5000 UT) Tabs Take 5,000 Units by mouth daily with breakfast.        Discharge Instructions:  Diet Recommendation: Cardiac/diabetic diet  Follow ups:    Follow-up Information     Loman Brooklyn, FNP Follow up.   Specialty: Family Medicine Contact information: [place] [address] [phone]         Minus Breeding, MD .   Specialty: Cardiology Contact information: [address] [place] [address] [phone]                 Wound care:     Discharge Exam:   Vitals:   05/04/21 0444 05/04/21 0823 05/04/21 1021 05/04/21 1228  BP: (!) 177/83  (!) 163/68   Pulse: 60  (!) 54   Resp: 18  15   Temp: 99.3 F (37.4 C)  98 F (36.7 C) 98 F (36.7 C)  TempSrc: Oral   Oral  SpO2: 96%  99%   Weight:  90.2 kg    Height:  5\' 3"  (1.6 m)      Body mass index is 35.23 kg/m.  General exam: Pleasant elderly Caucasian female.  Not in distress Skin: No rashes, lesions or ulcers. HEENT: Atraumatic, normocephalic, no obvious bleeding Lungs: Clear to auscultation bilaterally CVS: Regular rate and rhythm, no murmur GI/Abd soft, nontender, nondistended, bowel sound present CNS: Alert, awake, oriented x3 Psychiatry: Mood appropriate Extremities: No pedal edema, no calf tenderness  Time coordinating discharge: 35 minutes   The results of significant diagnostics from this hospitalization (including imaging, microbiology, ancillary and laboratory) are listed below for reference.    Procedures and Diagnostic Studies:   No results found.   Labs:   Basic Metabolic Panel: Recent Labs  Lab 04/30/21 1139 05/01/21 1539 05/02/21 0511 05/03/21 0458 05/04/21 0421  NA 141 139 141 138 138  K 4.3 4.1 3.7 3.5 3.7  CL 104 106 109 109 106  CO2 24 25  24 26 25   GLUCOSE 110* 113* 102* 106* 106*  BUN 28* 29* 27* 24* 25*  CREATININE 1.30* 1.49* 1.19* 1.25* 1.17*  CALCIUM 10.8* 11.1* 10.5* 10.5* 10.8*   GFR Estimated Creatinine Clearance: 41.5 mL/min (A) (by C-G formula based on SCr of 1.17 mg/dL (H)). Liver Function Tests: Recent Labs  Lab 05/02/21 0511  AST 12*  ALT 11  ALKPHOS 61  BILITOT 0.5  PROT 5.7*  ALBUMIN  3.4*   No results for input(s): LIPASE, AMYLASE in the last 168 hours. No results for input(s): AMMONIA in the last 168 hours. Coagulation profile No results for input(s): INR, PROTIME in the last 168 hours.  CBC: Recent Labs  Lab 04/30/21 1139 05/01/21 1539 05/02/21 0511 05/03/21 0458 05/04/21 0421  WBC 4.6 4.9 5.0 4.7 5.6  NEUTROABS  --  2.8  --  2.1 2.4  HGB 7.0* 7.3* 7.2* 7.4* 7.7*  HCT 22.2* 24.0* 23.0* 23.6* 25.6*  MCV 88 90.9 88.8 89.1 90.8  PLT 228 249 200 199 236   Cardiac Enzymes: No results for input(s): CKTOTAL, CKMB, CKMBINDEX, TROPONINI in the last 168 hours. BNP: Invalid input(s): POCBNP CBG: Recent Labs  Lab 05/03/21 1232 05/03/21 1642 05/03/21 2104 05/04/21 0945 05/04/21 1226  GLUCAP 92 97 114* 91 95   D-Dimer No results for input(s): DDIMER in the last 72 hours. Hgb A1c Recent Labs    05/02/21 0511  HGBA1C 5.1   Lipid Profile No results for input(s): CHOL, HDL, LDLCALC, TRIG, CHOLHDL, LDLDIRECT in the last 72 hours. Thyroid function studies No results for input(s): TSH, T4TOTAL, T3FREE, THYROIDAB in the last 72 hours.  Invalid input(s): FREET3 Anemia work up Recent Labs    05/01/21 1539  VITAMINB12 6,739*  FOLATE 18.3  FERRITIN 6*  TIBC 439  IRON 22*  RETICCTPCT 2.9   Microbiology Recent Results (from the past 240 hour(s))  Resp Panel by RT-PCR (Flu A&B, Covid) Nasopharyngeal Swab     Status: None   Collection Time: 05/01/21  5:22 PM   Specimen: Nasopharyngeal Swab; Nasopharyngeal(NP) swabs in vial transport medium  Result Value Ref Range Status   SARS Coronavirus 2 by RT PCR NEGATIVE NEGATIVE Final    Comment: (NOTE) SARS-CoV-2 target nucleic acids are NOT DETECTED.  The SARS-CoV-2 RNA is generally detectable in upper respiratory specimens during the acute phase of infection. The lowest concentration of SARS-CoV-2 viral copies this assay can detect is 138 copies/mL. A negative result does not preclude SARS-Cov-2 infection  and should not be used as the sole basis for treatment or other patient management decisions. A negative result may occur with  improper specimen collection/handling, submission of specimen other than nasopharyngeal swab, presence of viral mutation(s) within the areas targeted by this assay, and inadequate number of viral copies(<138 copies/mL). A negative result must be combined with clinical observations, patient history, and epidemiological information. The expected result is Negative.  Fact Sheet for Patients:  EntrepreneurPulse.com.au  Fact Sheet for Healthcare Providers:  IncredibleEmployment.be  This test is no t yet approved or cleared by the [place] FDA and  has been authorized for detection and/or diagnosis of SARS-CoV-2 by FDA under an Emergency Use Authorization (EUA). This EUA will remain  in effect (meaning this test can be used) for the duration of the COVID-19 declaration under Section 564(b)(1) of the Act, 21 U.S.C.section 360bbb-3(b)(1), unless the authorization is terminated  or revoked sooner.       Influenza A by PCR NEGATIVE NEGATIVE Final   Influenza B by  PCR NEGATIVE NEGATIVE Final    Comment: (NOTE) The Xpert Xpress SARS-CoV-2/FLU/RSV plus assay is intended as an aid in the diagnosis of influenza from Nasopharyngeal swab specimens and should not be used as a sole basis for treatment. Nasal washings and aspirates are unacceptable for Xpert Xpress SARS-CoV-2/FLU/RSV testing.  Fact Sheet for Patients: EntrepreneurPulse.com.au  Fact Sheet for Healthcare Providers: IncredibleEmployment.be  This test is not yet approved or cleared by the [place] FDA and has been authorized for detection and/or diagnosis of SARS-CoV-2 by FDA under an Emergency Use Authorization (EUA). This EUA will remain in effect (meaning this test can be used) for the duration of the COVID-19 declaration  under Section 564(b)(1) of the Act, 21 U.S.C. section 360bbb-3(b)(1), unless the authorization is terminated or revoked.  Performed at [facility], [place] [address] [place], [place] [address]      Signed: Marlowe Aschoff Vian Fluegel  [facility] 05/04/2021, 1:18 PM

## 2021-05-04 NOTE — Progress Notes (Signed)
Pt scheduled for [facility] Capsule Endoscopy this morning. Informed consent was obtained and pt ingested capsule at 0841. Capsule instructions provided to pt. Pt demonstrated understanding of [facility] capsule instructions.  Per [facility] Capsule instructions, pt may have clear liquids beginning at 1041. Pt may have a small snack at 1241. Pt may return to ordered diet at 1641. [facility] Capsule Study will end at 2041 at which time leads may be removed.  Debarah Crape, BSN, RN

## 2021-05-05 ENCOUNTER — Telehealth: Payer: Self-pay | Admitting: Family Medicine

## 2021-05-05 DIAGNOSIS — K31819 Angiodysplasia of stomach and duodenum without bleeding: Secondary | ICD-10-CM | POA: Diagnosis not present

## 2021-05-05 DIAGNOSIS — K922 Gastrointestinal hemorrhage, unspecified: Secondary | ICD-10-CM | POA: Diagnosis not present

## 2021-05-05 NOTE — Telephone Encounter (Signed)
Transition Care Management Follow-up Telephone Call Date of discharge and from where: [facility] 05/04/21 Diagnosis: symptomatic anemia How have you been since you were released from the hospital? Short winded if she does much, weak and tired. Any questions or concerns? No  Items Reviewed: Did the pt receive and understand the discharge instructions provided? Yes  Medications obtained and verified? Yes  Other?  She did swallow endoscopy and had her son drop it off at [facility] this morning - she is waiting for Dr Watt Climes to call her with results Any new allergies since your discharge? No  Dietary orders reviewed? No Do you have support at home? Yes   Home Care and Equipment/Supplies: Were home health services ordered? no Were any new equipment or medical supplies ordered?  No  Functional Questionnaire: (I = Independent and D = Dependent) ADLs: I  Bathing/Dressing- I  Meal Prep- I  Eating- I  Maintaining continence- I  Transferring/Ambulation- I  Managing Meds- I  Follow up appointments reviewed:  PCP Hospital f/u appt confirmed? Yes  Scheduled to see [name] on 05/10/21 @ 10. [facility] f/u appt confirmed? Yes  Scheduled to see cardiology on 06/04/21 @ 10:15 and GI on 05/18/21 @ 10:30. Are transportation arrangements needed? No  If their condition worsens, is the pt aware to call PCP or go to the Emergency Dept.? Yes Was the patient provided with contact information for the PCP's office or ED? Yes Was to pt encouraged to call back with questions or concerns? Yes

## 2021-05-05 NOTE — Telephone Encounter (Signed)
Appt made

## 2021-05-06 ENCOUNTER — Encounter (HOSPITAL_COMMUNITY): Payer: Self-pay | Admitting: Gastroenterology

## 2021-05-06 ENCOUNTER — Ambulatory Visit: Payer: Medicare Other | Admitting: Family Medicine

## 2021-05-06 ENCOUNTER — Other Ambulatory Visit: Payer: Self-pay

## 2021-05-06 DIAGNOSIS — I4891 Unspecified atrial fibrillation: Secondary | ICD-10-CM

## 2021-05-07 ENCOUNTER — Ambulatory Visit (HOSPITAL_COMMUNITY): Admission: RE | Admit: 2021-05-07 | Payer: Medicare Other | Source: Home / Self Care | Admitting: Cardiology

## 2021-05-07 ENCOUNTER — Encounter (HOSPITAL_COMMUNITY): Payer: Self-pay | Admitting: Critical Care Medicine

## 2021-05-07 ENCOUNTER — Encounter (HOSPITAL_COMMUNITY): Payer: Self-pay | Admitting: Gastroenterology

## 2021-05-07 SURGERY — ECHOCARDIOGRAM, TRANSESOPHAGEAL
Anesthesia: Monitor Anesthesia Care

## 2021-05-07 NOTE — H&P (Addendum)
Error Raylie Maddison   

## 2021-05-07 NOTE — Progress Notes (Signed)
Error [name] [name]   

## 2021-05-10 ENCOUNTER — Encounter: Payer: Self-pay | Admitting: Family Medicine

## 2021-05-10 ENCOUNTER — Other Ambulatory Visit: Payer: Self-pay

## 2021-05-10 ENCOUNTER — Ambulatory Visit (INDEPENDENT_AMBULATORY_CARE_PROVIDER_SITE_OTHER): Payer: Medicare Other | Admitting: Family Medicine

## 2021-05-10 VITALS — BP 130/74 | HR 74 | Temp 97.9°F | Ht 63.0 in | Wt 194.2 lb

## 2021-05-10 DIAGNOSIS — E119 Type 2 diabetes mellitus without complications: Secondary | ICD-10-CM

## 2021-05-10 DIAGNOSIS — Z23 Encounter for immunization: Secondary | ICD-10-CM | POA: Diagnosis not present

## 2021-05-10 DIAGNOSIS — I4891 Unspecified atrial fibrillation: Secondary | ICD-10-CM

## 2021-05-10 DIAGNOSIS — I10 Essential (primary) hypertension: Secondary | ICD-10-CM | POA: Diagnosis not present

## 2021-05-10 DIAGNOSIS — D649 Anemia, unspecified: Secondary | ICD-10-CM

## 2021-05-10 DIAGNOSIS — Z09 Encounter for follow-up examination after completed treatment for conditions other than malignant neoplasm: Secondary | ICD-10-CM | POA: Diagnosis not present

## 2021-05-10 DIAGNOSIS — R7989 Other specified abnormal findings of blood chemistry: Secondary | ICD-10-CM | POA: Diagnosis not present

## 2021-05-10 MED ORDER — METOPROLOL SUCCINATE ER 25 MG PO TB24: 25.0000 mg | ORAL_TABLET | Freq: Every day | ORAL | 1 refills | Status: DC

## 2021-05-10 MED ORDER — ATORVASTATIN CALCIUM 20 MG PO TABS: 20.0000 mg | ORAL_TABLET | Freq: Every day | ORAL | 1 refills | Status: DC

## 2021-05-10 NOTE — Progress Notes (Signed)
Assessment & Plan:  1. Hospital discharge follow-up  2. Symptomatic anemia Symptoms improving. Will place standing order for monthly CBC to keep a close eye on H/H. Keep appointment for upcoming EGD. - CBC with Differential/Platelet  3. Essential hypertension Well controlled on current regimen.  - BMP8+EGFR  4. Atrial fibrillation with RVR ([place]) Managed by cardiology. - CBC with Differential/Platelet   Return in about 2 months (around 07/10/2021) for annual physical.  [name], MSN, APRN, FNP-C [facility]  Subjective:    Patient ID: [name] [name], female    DOB: [birth year], 80 y.o.   MRN: [id number]  Patient Care Team: Loman Brooklyn, FNP as PCP - General (Family Medicine) Minus Breeding, MD as PCP - Cardiology (Cardiology) Paralee Cancel, MD as Consulting Physician (Orthopedic Surgery) Ilean China, RN as Case Manager [name], NP (Gastroenterology) Minus Breeding, MD as Consulting Physician (Cardiology) Celestia Khat, [facility] (Optometry) Lavera Guise, [facility] as Pharmacist (Family Medicine) Lendon Colonel, NP as Nurse Practitioner (Cardiology)   Chief Complaint:  Chief Complaint  Patient presents with   Transitions Of Care    [facility]- symptomatic anemia     HPI: [name] [name] is a 80 y.o. female presenting on 05/10/2021 for Transitions Of Care ([facility]- symptomatic anemia )  Patient was admitted to [facility] 05/01/2021-05/04/2021 due to symptomatic anemia. She was seen in her cardiology office the day of admission for a preop evaluation at which time her hemoglobin was a 7 and she was directed to the ER. She was transfused with PRBC and an iron. FOBT positive. Capsule study completed which showed ?mild gave w very minimal ?fresh blood in peripyloric area. ? Tiny prox sb erosion and ? 1 tiny avm w some dark material in mid and distal sb and prox colon. Recommendation is to repeat EGD which is scheduled for  05/18/2021. Resume iron at discharge, which she is taking twice daily.Vitamin B12 reduced from 5,000 mcg to 1,000 mcg daily since her level improved from 122 to >6,000 in the past two months. Amlodipine 2.5 mg daily was added due to hypertension.  Similar situation occurred last year at which time her Eliquis was decreased to 2.5 mg twice daily. She had no further issues, but her Eliquis was increased back to 5 mg twice daily earlier this year.   Shortness of breath and weakness are improving.   New complaints: None   Social history:  Relevant past medical, surgical, family and social history reviewed and updated as indicated. Interim medical history since our last visit reviewed.  Allergies and medications reviewed and updated.  DATA REVIEWED: CHART IN EPIC  ROS: Negative unless specifically indicated above in HPI.    Current Outpatient Medications:    acetaminophen (TYLENOL) 325 MG tablet, Take 2 tablets (650 mg total) by mouth every 6 (six) hours as needed for mild pain or headache (or Fever >/= 101)., Disp: 12 tablet, Rfl: 0   allopurinol (ZYLOPRIM) 100 MG tablet, Take 1 tablet (100 mg total) by mouth daily., Disp: 90 tablet, Rfl: 1   amLODipine (NORVASC) 2.5 MG tablet, Take 1 tablet (2.5 mg total) by mouth daily., Disp: 30 tablet, Rfl: 2   apixaban (ELIQUIS) 5 MG TABS tablet, Take 1 tablet (5 mg total) by mouth 2 (two) times daily., Disp: 90 tablet, Rfl: 3   atorvastatin (LIPITOR) 20 MG tablet, Take 1 tablet (20 mg total) by mouth daily. (Patient taking differently: Take 20 mg by mouth daily after breakfast.),  Disp: 30 tablet, Rfl: 2   Cholecalciferol (VITAMIN D-3) 125 MCG (5000 UT) TABS, Take 5,000 Units by mouth daily with breakfast., Disp: , Rfl:    diclofenac Sodium (VOLTAREN) 1 % GEL, APPLY 4 GRAMS TOPICALLY 4  TIMES DAILY (Patient taking differently: Apply 2-4 g topically 4 (four) times daily as needed (for bilateral knee pain).), Disp: 300 g, Rfl: 0   EUTHYROX 50 MCG tablet,  TAKE 1 TABLET BY MOUTH ONCE DAILY BEFORE BREAKFAST (Patient taking differently: Take 50 mcg by mouth daily before breakfast.), Disp: 30 tablet, Rfl: 10   ferrous sulfate 325 (65 FE) MG EC tablet, Take 1 tablet (325 mg total) by mouth 2 (two) times daily., Disp: 60 tablet, Rfl: 2   furosemide (LASIX) 20 MG tablet, Take 1 tablet (20 mg total) by mouth daily., Disp: 90 tablet, Rfl: 2   GNP CRANBERRY EXTRACT PO, Take 1 capsule by mouth in the morning., Disp: , Rfl:    loratadine (CLARITIN) 10 MG tablet, Take 10 mg by mouth daily., Disp: , Rfl:    metoprolol succinate (TOPROL-XL) 25 MG 24 hr tablet, Take 1 tablet (25 mg total) by mouth daily., Disp: 30 tablet, Rfl: 1   ONE TOUCH ULTRA TEST test strip, USE UP TO FOUR TIMES DAILY AS DIRECTED (Patient taking differently: 1 each by Other route 4 (four) times daily.), Disp: 400 each, Rfl: 3   oxybutynin (DITROPAN-XL) 10 MG 24 hr tablet, Take 1 tablet (10 mg total) by mouth at bedtime., Disp: 90 tablet, Rfl: 1   pantoprazole (PROTONIX) 40 MG tablet, Take 1 tablet (40 mg total) by mouth 2 (two) times daily before a meal., Disp: 180 tablet, Rfl: 1   SYSTANE HYDRATION PF 0.4-0.3 % SOLN, Place 1 drop into both eyes 3 (three) times daily as needed (for dryness)., Disp: , Rfl:    vitamin B-12 1000 MCG tablet, Take 1 tablet (1,000 mcg total) by mouth daily., Disp: 30 tablet, Rfl: 2   No Known Allergies Past Medical History:  Diagnosis Date   Anemia    Arthritis    Ostearthritis- hips, knees, fingers   Diabetes mellitus without complication (HCC)    DVT (deep venous thrombosis) (HCC)    Dyspnea    GERD (gastroesophageal reflux disease)    Headache(784.0)    tx. Valproic acid   History of kidney stones    Hypertension    Hypothyroidism    Pulmonary embolism [facility]     Past Surgical History:  Procedure Laterality Date   ABDOMINAL HYSTERECTOMY     BILATERAL HIP ARTHROSCOPY Left    BIOPSY  12/08/2019   Procedure: BIOPSY;  Surgeon: Daneil Dolin, MD;   Location: [facility];  Service: Endoscopy;;   CATARACT EXTRACTION, BILATERAL     COLONOSCOPY N/A 12/08/2019   polyps (tubular adenoma), diverticulosis, colonic lipoma, no surveillance due to age   55 W/ [place] Right 12/25/2020   Procedure: CYSTOSCOPY WITH RETROGRADE PYELOGRAM/URETERAL STENT PLACEMENT;  Surgeon: Janith Lima, MD;  Location: [facility] ORS;  Service: Urology;  Laterality: Right;   CYSTOSCOPY/URETEROSCOPY/HOLMIUM LASER/STENT PLACEMENT Right 01/15/2021   Procedure: CYSTOSCOPY/RETROGRADE/URETEROSCOPY/HOLMIUM LASER/STENT EXCHANGE;  Surgeon: Janith Lima, MD;  Location: [facility];  Service: Urology;  Laterality: Right;   ESOPHAGOGASTRODUODENOSCOPY N/A 12/08/2019   normal esophagus with 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   GIVENS CAPSULE STUDY N/A 01/13/2020   Procedure: GIVENS CAPSULE STUDY;  Surgeon: Daneil Dolin, MD;  Location: [facility];  Service: Endoscopy;  Laterality:  N/A;  7:30am   GIVENS CAPSULE STUDY N/A 05/04/2021   Procedure: GIVENS CAPSULE STUDY;  Surgeon: Clarene Essex, MD;  Location: [facility] ENDOS[facility];  Service: Endoscopy;  Laterality: N/A;   MULTIPLE TOOTH EXTRACTIONS     60's   PARATHYROIDECTOMY     POLYPECTOMY  12/08/2019   Procedure: POLYPECTOMY;  Surgeon: Daneil Dolin, MD;  Location: [facility];  Service: Endoscopy;;   TOTAL HIP ARTHROPLASTY Right 10/29/2013   Procedure: RIGHT TOTAL HIP ARTHROPLASTY ANTERIOR APPROACH;  Surgeon: Mauri Pole, MD;  Location: [facility]S;  Service: Orthopedics;  Laterality: Right;    Social History   Socioeconomic History   Marital status: Widowed    Spouse name: Not on file   Number of children: 4   Years of education: 12   Highest education level: High school graduate  Occupational History   Occupation: Retired    Comment: [employer]  Tobacco Use   Smoking status: Never   Smokeless tobacco: Never  Vaping Use   Vaping Use: Never used  Substance and  Sexual Activity   Alcohol use: No    Alcohol/week: 0.0 standard drinks   Drug use: No   Sexual activity: Not Currently  Other Topics Concern   Not on file  Social History Narrative   Patient is widowed and lives in a one story home. She has four adult children and one son lives with her.    Social Determinants of Health   Financial Resource Strain: Low Risk    Difficulty of Paying Living Expenses: Not hard at all  Food Insecurity: No Food Insecurity   Worried About Charity fundraiser in the Last Year: Never true   [place] in the Last Year: Never true  Transportation Needs: No Transportation Needs   Lack of Transportation (Medical): No   Lack of Transportation (Non-Medical): No  Physical Activity: Insufficiently Active   Days of Exercise per Week: 7 days   Minutes of Exercise per Session: 20 min  Stress: No Stress Concern Present   Feeling of Stress : Not at all  Social Connections: Moderately Integrated   Frequency of Communication with Friends and Family: More than three times a week   Frequency of Social Gatherings with Friends and Family: More than three times a week   Attends Religious Services: More than 4 times per year   Active Member of [employer] or Organizations: Yes   Attends Archivist Meetings: More than 4 times per year   Marital Status: Widowed  Human resources officer Violence: Not At Risk   Fear of Current or Ex-Partner: No   Emotionally Abused: No   Physically Abused: No   Sexually Abused: No        Objective:    BP 130/74   Pulse 74   Temp 97.9 F (36.6 C) (Temporal)   Ht 5' 3"  (1.6 m)   Wt 194 lb 3.2 oz (88.1 kg)   SpO2 98%   BMI 34.40 kg/m   Wt Readings from Last 3 Encounters:  05/10/21 194 lb 3.2 oz (88.1 kg)  05/04/21 198 lb 13.7 oz (90.2 kg)  04/30/21 199 lb (90.3 kg)    Physical Exam Vitals reviewed.  Constitutional:      General: She is not in acute distress.    Appearance: Normal appearance. She is obese. She is not  ill-appearing, toxic-appearing or diaphoretic.  HENT:     Head: Normocephalic and atraumatic.  Eyes:     General: No scleral  icterus.       Right eye: No discharge.        Left eye: No discharge.     Conjunctiva/sclera: Conjunctivae normal.  Cardiovascular:     Rate and Rhythm: Normal rate and regular rhythm.     Heart sounds: Normal heart sounds. No murmur heard.   No friction rub. No gallop.  Pulmonary:     Effort: Pulmonary effort is normal. No respiratory distress.     Breath sounds: Normal breath sounds. No stridor. No wheezing, rhonchi or rales.  Musculoskeletal:        General: Normal range of motion.     Cervical back: Normal range of motion.  Skin:    General: Skin is warm and dry.     Capillary Refill: Capillary refill takes less than 2 seconds.  Neurological:     General: No focal deficit present.     Mental Status: She is alert and oriented to person, place, and time. Mental status is at baseline.  Psychiatric:        Mood and Affect: Mood normal.        Behavior: Behavior normal.        Thought Content: Thought content normal.        Judgment: Judgment normal.    Lab Results  Component Value Date   TSH 3.605 02/07/2021   Lab Results  Component Value Date   WBC 5.6 05/04/2021   HGB 7.7 (L) 05/04/2021   HCT 25.6 (L) 05/04/2021   MCV 90.8 05/04/2021   PLT 236 05/04/2021   Lab Results  Component Value Date   NA 138 05/04/2021   K 3.7 05/04/2021   CO2 25 05/04/2021   GLUCOSE 106 (H) 05/04/2021   BUN 25 (H) 05/04/2021   CREATININE 1.17 (H) 05/04/2021   BILITOT 0.5 05/02/2021   ALKPHOS 61 05/02/2021   AST 12 (L) 05/02/2021   ALT 11 05/02/2021   PROT 5.7 (L) 05/02/2021   ALBUMIN 3.4 (L) 05/02/2021   CALCIUM 10.8 (H) 05/04/2021   ANIONGAP 7 05/04/2021   EGFR 42 (L) 04/30/2021   Lab Results  Component Value Date   CHOL 108 02/07/2021   Lab Results  Component Value Date   HDL 29 (L) 02/07/2021   Lab Results  Component Value Date   LDLCALC 66  02/07/2021   Lab Results  Component Value Date   TRIG 65 02/07/2021   Lab Results  Component Value Date   CHOLHDL 3.7 02/07/2021   Lab Results  Component Value Date   HGBA1C 5.1 05/02/2021

## 2021-05-11 LAB — CBC WITH DIFFERENTIAL/PLATELET
Basophils Absolute: 0 10*3/uL (ref 0.0–0.2)
Basos: 0 %
EOS (ABSOLUTE): 0.1 10*3/uL (ref 0.0–0.4)
Eos: 3 %
Hematocrit: 30.6 % — ABNORMAL LOW (ref 34.0–46.6)
Hemoglobin: 9.6 g/dL — ABNORMAL LOW (ref 11.1–15.9)
Immature Grans (Abs): 0 10*3/uL (ref 0.0–0.1)
Immature Granulocytes: 1 %
Lymphocytes Absolute: 1.2 10*3/uL (ref 0.7–3.1)
Lymphs: 23 %
MCH: 28.4 pg (ref 26.6–33.0)
MCHC: 31.4 g/dL — ABNORMAL LOW (ref 31.5–35.7)
MCV: 91 fL (ref 79–97)
Monocytes Absolute: 0.6 10*3/uL (ref 0.1–0.9)
Monocytes: 11 %
Neutrophils Absolute: 3.4 10*3/uL (ref 1.4–7.0)
Neutrophils: 62 %
Platelets: 260 10*3/uL (ref 150–450)
RBC: 3.38 x10E6/uL — ABNORMAL LOW (ref 3.77–5.28)
RDW: 16.7 % — ABNORMAL HIGH (ref 11.7–15.4)
WBC: 5.5 10*3/uL (ref 3.4–10.8)

## 2021-05-11 LAB — BMP8+EGFR
BUN/Creatinine Ratio: 12 (ref 12–28)
BUN: 21 mg/dL (ref 8–27)
CO2: 23 mmol/L (ref 20–29)
Calcium: 11.2 mg/dL — ABNORMAL HIGH (ref 8.7–10.3)
Chloride: 107 mmol/L — ABNORMAL HIGH (ref 96–106)
Creatinine, Ser: 1.7 mg/dL — ABNORMAL HIGH (ref 0.57–1.00)
Glucose: 112 mg/dL — ABNORMAL HIGH (ref 70–99)
Potassium: 4.3 mmol/L (ref 3.5–5.2)
Sodium: 145 mmol/L — ABNORMAL HIGH (ref 134–144)
eGFR: 30 mL/min/{1.73_m2} — ABNORMAL LOW (ref >59)

## 2021-05-11 LAB — PTH, INTACT AND CALCIUM: PTH: 103 pg/mL — ABNORMAL HIGH (ref 15–65)

## 2021-05-11 LAB — MICROALBUMIN / CREATININE URINE RATIO
Creatinine, Urine: 31.1 mg/dL
Microalb/Creat Ratio: 79 mg/g creat — ABNORMAL HIGH (ref 0–29)
Microalbumin, Urine: 24.7 ug/mL

## 2021-05-11 NOTE — Addendum Note (Signed)
Addended by: Loman Brooklyn on: 05/11/2021 08:02 AM   Modules accepted: Orders

## 2021-05-13 ENCOUNTER — Ambulatory Visit: Payer: Medicare Other | Admitting: *Deleted

## 2021-05-13 DIAGNOSIS — I5041 Acute combined systolic (congestive) and diastolic (congestive) heart failure: Secondary | ICD-10-CM | POA: Diagnosis not present

## 2021-05-13 DIAGNOSIS — I4891 Unspecified atrial fibrillation: Secondary | ICD-10-CM

## 2021-05-13 DIAGNOSIS — J9601 Acute respiratory failure with hypoxia: Secondary | ICD-10-CM | POA: Diagnosis not present

## 2021-05-13 DIAGNOSIS — I10 Essential (primary) hypertension: Secondary | ICD-10-CM

## 2021-05-13 DIAGNOSIS — E119 Type 2 diabetes mellitus without complications: Secondary | ICD-10-CM

## 2021-05-13 NOTE — Chronic Care Management (AMB) (Signed)
Chronic Care Management   CCM RN Visit Note  05/13/2021 Name: [name] [name] MRN: [id number] DOB: 01-Dec-1940  Subjective: [name] [name] is a 80 y.o. year old female who is a primary care patient of Loman Brooklyn, FNP. The care management team was consulted for assistance with disease management and care coordination needs.    Engaged with patient by telephone for follow up visit in response to provider referral for case management and/or care coordination services.   Consent to Services:  The patient was given information about Chronic Care Management services, agreed to services, and gave verbal consent prior to initiation of services.  Please see initial visit note for detailed documentation.   Patient agreed to services and verbal consent obtained.   Assessment: Review of patient past medical history, allergies, medications, health status, including review of consultants reports, laboratory and other test data, was performed as part of comprehensive evaluation and provision of chronic care management services.   SDOH (Social Determinants of Health) assessments and interventions performed:    CCM Care Plan  No Known Allergies  Outpatient Encounter Medications as of 05/13/2021  Medication Sig   acetaminophen (TYLENOL) 325 MG tablet Take 2 tablets (650 mg total) by mouth every 6 (six) hours as needed for mild pain or headache (or Fever >/= 101).   allopurinol (ZYLOPRIM) 100 MG tablet Take 1 tablet (100 mg total) by mouth daily.   amLODipine (NORVASC) 2.5 MG tablet Take 1 tablet (2.5 mg total) by mouth daily.   apixaban (ELIQUIS) 5 MG TABS tablet Take 1 tablet (5 mg total) by mouth 2 (two) times daily.   atorvastatin (LIPITOR) 20 MG tablet Take 1 tablet (20 mg total) by mouth daily.   Cholecalciferol (VITAMIN D-3) 125 MCG (5000 UT) TABS Take 5,000 Units by mouth daily with breakfast.   diclofenac Sodium (VOLTAREN) 1 % GEL APPLY 4 GRAMS TOPICALLY 4  TIMES DAILY (Patient  taking differently: Apply 2-4 g topically 4 (four) times daily as needed (for bilateral knee pain).)   EUTHYROX 50 MCG tablet TAKE 1 TABLET BY MOUTH ONCE DAILY BEFORE BREAKFAST (Patient taking differently: Take 50 mcg by mouth daily before breakfast.)   ferrous sulfate 325 (65 FE) MG EC tablet Take 1 tablet (325 mg total) by mouth 2 (two) times daily.   furosemide (LASIX) 20 MG tablet Take 1 tablet (20 mg total) by mouth daily.   GNP CRANBERRY EXTRACT PO Take 1 capsule by mouth in the morning.   loratadine (CLARITIN) 10 MG tablet Take 10 mg by mouth daily.   metoprolol succinate (TOPROL-XL) 25 MG 24 hr tablet Take 1 tablet (25 mg total) by mouth daily.   ONE TOUCH ULTRA TEST test strip USE UP TO FOUR TIMES DAILY AS DIRECTED (Patient taking differently: 1 each by Other route 4 (four) times daily.)   oxybutynin (DITROPAN-XL) 10 MG 24 hr tablet Take 1 tablet (10 mg total) by mouth at bedtime.   pantoprazole (PROTONIX) 40 MG tablet Take 1 tablet (40 mg total) by mouth 2 (two) times daily before a meal.   SYSTANE HYDRATION PF 0.4-0.3 % SOLN Place 1 drop into both eyes 3 (three) times daily as needed (for dryness).   vitamin B-12 1000 MCG tablet Take 1 tablet (1,000 mcg total) by mouth daily.   No facility-administered encounter medications on file as of 05/13/2021.    Patient Active Problem List   Diagnosis Date Noted   Symptomatic anemia 05/01/2021   Nonrheumatic tricuspid valve regurgitation 02/24/2021  Acute pulmonary edema (HCC)    Acute combined systolic and diastolic heart failure (HCC)    Nonrheumatic mitral valve regurgitation    Severe systolic congestive heart failure ([place]) 02/07/2021   Rapid atrial fibrillation ([place]) 02/07/2021   Atrial fibrillation with RVR ([place]) 02/04/2021   Educated about COVID-19 virus infection 02/02/2020   Iron deficiency anemia due to chronic blood loss    Occult blood in stools    Unprovoked DVT and Unprovoked Pulmonary Embolism -Dxed 03/2019 04/03/2019    Sciatica of right side 10/18/2018   Seborrheic keratosis 10/17/2016   Hyperlipidemia associated with type 2 diabetes mellitus ([place]) 10/17/2016   Overactive bladder 10/17/2016   Gastroesophageal reflux disease without esophagitis 10/17/2016   Primary osteoarthritis involving multiple joints 10/17/2016   Chronic gout without tophus 10/17/2016   Type 2 diabetes mellitus without complication, without long-term current use of insulin ([place]) 05/16/2016   Essential hypertension 05/16/2016   Hypothyroidism 05/16/2016   Anemia 10/30/2013   Morbid obesity ([place]) 10/30/2013   S/P right THA, AA 10/29/2013    Conditions to be addressed/monitored:Atrial Fibrillation, HTN, DMII, and eyelid irritation  Care Plan : [facility] Care Plan  Updates made by Ilean China, RN since 05/13/2021 12:00 AM     Problem: Chronic Disease Management Needs (DM, HTN, HLD, Afib, chronic anticoagulation)   Priority: Medium     Long-Range Goal: Work with [place] Coordination Regarding Chronic Medical Conditions   Start Date: 02/02/2021  Recent Progress: On track  Priority: Medium  Note:   Current Barriers:  Chronic Disease Management support and education needs related to Atrial Fibrillation, HTN, HLD, and DMII  RNCM Clinical Goal(s):  Patient will work with RN Case Manager to address needs related to Atrial Fibrillation, HTN, HLD, and DMII and recent hospitalization for septic shock secondary to kidney stone and UTI demonstrate ongoing self health care management ability through collaboration with RN Care manager, provider, and care team.   Interventions: 1:1 collaboration with Loman Brooklyn, FNP regarding development and update of comprehensive plan of care as evidenced by provider attestation and co-signature  Inter-disciplinary care team collaboration (see longitudinal plan of care) Reviewed chart including relevant office notes, lab results, and consultation reports   Diabetes:   (Status: Condition stable. Not addressed this visit.) Lab Results  Component Value Date   HGBA1C 5.7 (H) 12/25/2020   HGBA1C 5.7 10/20/2020   HGBA1C 5.4 06/04/2020   Lab Results  Component Value Date   LDLCALC 66 02/07/2021   CREATININE 1.50 (H) 02/24/2021  Assessed patient's understanding of A1c goal: <7% Reviewed and discussed most recent A1C Reviewed medications with patient and discussed importance of medication adherence; Counseled on importance of regular laboratory monitoring as prescribed; Discussed plans with patient for ongoing care management follow up and provided patient with direct contact information for care management team; Reviewed scheduled/upcoming provider appointments including: Lottie Dawson, PharmD on 02/12/21 for prescription assistance; Encouraged to continue to follow an ADA/carb modified diet  Hypertension: (Status: Condition stable. Not addressed this visit.) Last practice recorded BP readings:  BP Readings from Last 3 Encounters:  03/23/21 140/68  03/05/21 138/70  02/24/21 (!) 141/79   Evaluation of current treatment plan related to hypertension self management and patient's adherence to plan as established by provider; Reviewed medications with patient and discussed importance of compliance; Discussed plans with patient for ongoing care management follow up and provided patient with direct contact information for care management team; Advised patient, providing education and rationale, to monitor  blood pressure daily and record, calling PCP for findings outside established parameters;  Reviewed scheduled/upcoming provider appointments including:  04/03/21 with PCP  Reviewed and discussed recent office visit with cardiologist  Questioned mobility and physical activity level. Both are improving. Physical and occupational therapy have ended. Continues to use a walker from time to time but is mostly ambulating on her own without difficulty. She is able to  perform ADLs independently.  Discussed oxygen use. Patient is not using oxygen and is waiting for supplier to pick it up. O2 levels have been normal during the day and night.   HDL:  (Status: Condition stable. Not addressed this visit.) Evaluation of current treatment plan related to HLD self-management and patient's adherence to plan as established by provider. Discussed plans with patient for ongoing care management follow up and provided patient with direct contact information for care management team Reviewed and discussed recent lab results Reviewed and discussed medications and compliance Encouraged patient to continue increasing physical activity level as tolerated  A-fib:  (Status: Condition stable. Not addressed this visit.) Afib action plan reviewed Assessed social determinant of health barriers Discussed prescription assistance approval for Eliquis. Approved until 07/24/21. Received shipment. Reviewed and discussed upcoming appointments: Jory Sims, NP (cardiology) on 04/30/21 to discuss results of echo and need for transesophageal echo Discussed symptoms of Afib. Denies any feeling of palpitations or fluttering. Discussed use of metoprolol succinate 25mg  daily to control heart rate. Patient hasn't experienced any episodes of tachycardia but concerned about bradycardia. She does check her pulse ox and heart rate multiple times a day. HR was averaging in the 30s and 40s at rest and 60s with exertion. She does feel a little "washed out" at times but overall Ok. Since our last visit her heart rate has been mostly in the 40s and 50s. I am curious about how low it drops at night but patient does not have a way to monitor that. We discussed this during our last call and she has been checking it as soon as she wakes up and it's normally in the 40s. Collaborated with Jory Sims, NP with cardiology office regarding bradychardia. Per Ms Purcell Nails, patient will likely need medication  adjustments and she will do an  EKG at her visit on 04/30/21 to rule out heart block as a contributing cause. Patient made aware of NP recommendation. Advised patient to continue to monitor heart rate and symptoms and to reach out to cardio or seek emergency medical attention if needed Encouraged to reach out to [facility] as needed  Acute Eyelid Problem:  (Status: New goal.) Talked with patient by telephone regarding bilateral eyelid irritation, itching, and swelling. First noticed it yesterday. Has not used any different medications, eye drops, facial products, etc. Has used systane eye drops as usual. Advised that some eye drops have preservative or added ingredients that she may be allergic to.  Does not have any eyeball symptoms.  Discussed possibly of blepharitis. Denies any dryness or flaking.  Encouraged to continue daily Claritin. Can take a benadryl at night if needed. Advised that it may make her drowsy. Clean with gentle cleanser and pat dry. Can put a thin layer of Vaseline on eyelid to see if that will help with irration Advised to see a provider if symptoms get worse or don't improve  Patient Goals/Self-Care Activities: Patient will self administer medications as prescribed Patient will attend all scheduled provider appointments Patient will continue to perform ADL's independently Patient will call provider office for new concerns  or questions Patient will take medication as directed. Call PCP or seek appropriate medical attention as needed for new or worsening symptoms      Plan:Telephone follow up appointment with care management team member scheduled for:  05/14/21 with RNCM The patient has been provided with contact information for the care management team and has been advised to call with any health related questions or concerns.   Chong Sicilian, BSN, RN-BC Embedded Chronic Care Manager [facility] Direct Dial: [phone]

## 2021-05-13 NOTE — Patient Instructions (Signed)
Visit Information    Patient Care Plan: RNCM Care Plan     Problem Identified: Chronic Disease Management Needs (DM, HTN, HLD, Afib, chronic anticoagulation)   Priority: Medium     Long-Range Goal: Work with [facility] For Care Management/ Care Coordination Regarding Chronic Medical Conditions   Start Date: 02/02/2021  Recent Progress: On track  Priority: Medium  Note:   Current Barriers:  Chronic Disease Management support and education needs related to Atrial Fibrillation, HTN, HLD, and DMII  RNCM Clinical Goal(s):  Patient will work with RN Case Manager to address needs related to Atrial Fibrillation, HTN, HLD, and DMII and recent hospitalization for septic shock secondary to kidney stone and UTI demonstrate ongoing self health care management ability through collaboration with RN Care manager, provider, and care team.   Interventions: 1:1 collaboration with Loman Brooklyn, FNP regarding development and update of comprehensive plan of care as evidenced by provider attestation and co-signature  Inter-disciplinary care team collaboration (see longitudinal plan of care) Reviewed chart including relevant office notes, lab results, and consultation reports   Diabetes:  (Status: Condition stable. Not addressed this visit.) Lab Results  Component Value Date   HGBA1C 5.7 (H) 12/25/2020   HGBA1C 5.7 10/20/2020   HGBA1C 5.4 06/04/2020   Lab Results  Component Value Date   LDLCALC 66 02/07/2021   CREATININE 1.50 (H) 02/24/2021  Assessed patient's understanding of A1c goal: <7% Reviewed and discussed most recent A1C Reviewed medications with patient and discussed importance of medication adherence; Counseled on importance of regular laboratory monitoring as prescribed; Discussed plans with patient for ongoing care management follow up and provided patient with direct contact information for care management team; Reviewed scheduled/upcoming provider appointments including: Lottie Dawson,  PharmD on 02/12/21 for prescription assistance; Encouraged to continue to follow an ADA/carb modified diet  Hypertension: (Status: Condition stable. Not addressed this visit.) Last practice recorded BP readings:  BP Readings from Last 3 Encounters:  03/23/21 140/68  03/05/21 138/70  02/24/21 (!) 141/79   Evaluation of current treatment plan related to hypertension self management and patient's adherence to plan as established by provider; Reviewed medications with patient and discussed importance of compliance; Discussed plans with patient for ongoing care management follow up and provided patient with direct contact information for care management team; Advised patient, providing education and rationale, to monitor blood pressure daily and record, calling PCP for findings outside established parameters;  Reviewed scheduled/upcoming provider appointments including:  04/03/21 with PCP  Reviewed and discussed recent office visit with cardiologist  Questioned mobility and physical activity level. Both are improving. Physical and occupational therapy have ended. Continues to use a walker from time to time but is mostly ambulating on her own without difficulty. She is able to perform ADLs independently.  Discussed oxygen use. Patient is not using oxygen and is waiting for supplier to pick it up. O2 levels have been normal during the day and night.   HDL:  (Status: Condition stable. Not addressed this visit.) Evaluation of current treatment plan related to HLD self-management and patient's adherence to plan as established by provider. Discussed plans with patient for ongoing care management follow up and provided patient with direct contact information for care management team Reviewed and discussed recent lab results Reviewed and discussed medications and compliance Encouraged patient to continue increasing physical activity level as tolerated  A-fib:  (Status: Condition stable. Not addressed this  visit.) Afib action plan reviewed Assessed social determinant of health barriers Discussed prescription assistance approval for  Eliquis. Approved until 07/24/21. Received shipment. Reviewed and discussed upcoming appointments: Jory Sims, NP (cardiology) on 04/30/21 to discuss results of echo and need for transesophageal echo Discussed symptoms of Afib. Denies any feeling of palpitations or fluttering. Discussed use of metoprolol succinate 25mg  daily to control heart rate. Patient hasn't experienced any episodes of tachycardia but concerned about bradycardia. She does check her pulse ox and heart rate multiple times a day. HR was averaging in the 30s and 40s at rest and 60s with exertion. She does feel a little "washed out" at times but overall Ok. Since our last visit her heart rate has been mostly in the 40s and 50s. I am curious about how low it drops at night but patient does not have a way to monitor that. We discussed this during our last call and she has been checking it as soon as she wakes up and it's normally in the 40s. Collaborated with Jory Sims, NP with cardiology office regarding bradychardia. Per Ms Purcell Nails, patient will likely need medication adjustments and she will do an  EKG at her visit on 04/30/21 to rule out heart block as a contributing cause. Patient made aware of NP recommendation. Advised patient to continue to monitor heart rate and symptoms and to reach out to cardio or seek emergency medical attention if needed Encouraged to reach out to [facility] as needed  Acute Eyelid Problem:  (Status: New goal.) Talked with patient by telephone regarding bilateral eyelid irritation, itching, and swelling. First noticed it yesterday. Has not used any different medications, eye drops, facial products, etc. Has used systane eye drops as usual. Advised that some eye drops have preservative or added ingredients that she may be allergic to.  Does not have any eyeball symptoms.   Discussed possibly of blepharitis. Denies any dryness or flaking.  Encouraged to continue daily Claritin. Can take a benadryl at night if needed. Advised that it may make her drowsy. Clean with gentle cleanser and pat dry. Can put a thin layer of Vaseline on eyelid to see if that will help with irration Advised to see a provider if symptoms get worse or don't improve  Patient Goals/Self-Care Activities: Patient will self administer medications as prescribed Patient will attend all scheduled provider appointments Patient will continue to perform ADL's independently Patient will call provider office for new concerns or questions Patient will take medication as directed. Call PCP or seek appropriate medical attention as needed for new or worsening symptoms      Patient verbalizes understanding of instructions provided today and agrees to view in [place].   Telephone follow up appointment with care management team member scheduled for: 05/14/21 with RNCM The patient has been provided with contact information for the care management team and has been advised to call with any health related questions or concerns.   Chong Sicilian, BSN, RN-BC Embedded Chronic Care Manager [facility] / [place] [facility] Direct Dial: [phone]

## 2021-05-14 ENCOUNTER — Ambulatory Visit (INDEPENDENT_AMBULATORY_CARE_PROVIDER_SITE_OTHER): Payer: Medicare Other | Admitting: *Deleted

## 2021-05-14 DIAGNOSIS — I4891 Unspecified atrial fibrillation: Secondary | ICD-10-CM

## 2021-05-14 DIAGNOSIS — I10 Essential (primary) hypertension: Secondary | ICD-10-CM

## 2021-05-14 DIAGNOSIS — D649 Anemia, unspecified: Secondary | ICD-10-CM

## 2021-05-14 DIAGNOSIS — E119 Type 2 diabetes mellitus without complications: Secondary | ICD-10-CM

## 2021-05-17 ENCOUNTER — Encounter: Payer: Self-pay | Admitting: *Deleted

## 2021-05-17 NOTE — Chronic Care Management (AMB) (Signed)
Chronic Care Management   CCM RN Visit Note  05/14/2021 Name: [name] [name] MRN: [id number] DOB: 08-26-1940  Subjective: [name] [name] is a 80 y.o. year old female who is a primary care patient of Loman Brooklyn, FNP. The care management team was consulted for assistance with disease management and care coordination needs.    Engaged with patient by telephone for follow up visit in response to provider referral for case management and/or care coordination services.   Consent to Services:  The patient was given the following information about Chronic Care Management services today, agreed to services, and gave verbal consent: 1. CCM service includes personalized support from designated clinical staff supervised by the primary care provider, including individualized plan of care and coordination with other care providers 2. 24/7 contact phone numbers for assistance for urgent and routine care needs. 3. Service will only be billed when office clinical staff spend 20 minutes or more in a month to coordinate care. 4. Only one practitioner may furnish and bill the service in a calendar month. 5.The patient may stop CCM services at any time (effective at the end of the month) by phone call to the office staff. 6. The patient will be responsible for cost sharing (co-pay) of up to 20% of the service fee (after annual deductible is met). Patient agreed to services and consent obtained.  Patient agreed to services and verbal consent obtained.   Assessment: Review of patient past medical history, allergies, medications, health status, including review of consultants reports, laboratory and other test data, was performed as part of comprehensive evaluation and provision of chronic care management services.   SDOH (Social Determinants of Health) assessments and interventions performed:    CCM Care Plan  No Known Allergies  Outpatient Encounter Medications as of 05/14/2021  Medication Sig    acetaminophen (TYLENOL) 325 MG tablet Take 2 tablets (650 mg total) by mouth every 6 (six) hours as needed for mild pain or headache (or Fever >/= 101).   allopurinol (ZYLOPRIM) 100 MG tablet Take 1 tablet (100 mg total) by mouth daily.   amLODipine (NORVASC) 2.5 MG tablet Take 1 tablet (2.5 mg total) by mouth daily.   apixaban (ELIQUIS) 5 MG TABS tablet Take 1 tablet (5 mg total) by mouth 2 (two) times daily.   atorvastatin (LIPITOR) 20 MG tablet Take 1 tablet (20 mg total) by mouth daily.   Cholecalciferol (VITAMIN D-3) 125 MCG (5000 UT) TABS Take 5,000 Units by mouth daily with breakfast.   diclofenac Sodium (VOLTAREN) 1 % GEL APPLY 4 GRAMS TOPICALLY 4  TIMES DAILY (Patient taking differently: Apply 2-4 g topically 4 (four) times daily as needed (for bilateral knee pain).)   EUTHYROX 50 MCG tablet TAKE 1 TABLET BY MOUTH ONCE DAILY BEFORE BREAKFAST (Patient taking differently: Take 50 mcg by mouth daily before breakfast.)   ferrous sulfate 325 (65 FE) MG EC tablet Take 1 tablet (325 mg total) by mouth 2 (two) times daily.   furosemide (LASIX) 20 MG tablet Take 1 tablet (20 mg total) by mouth daily.   GNP CRANBERRY EXTRACT PO Take 1 capsule by mouth in the morning.   loratadine (CLARITIN) 10 MG tablet Take 10 mg by mouth daily.   metoprolol succinate (TOPROL-XL) 25 MG 24 hr tablet Take 1 tablet (25 mg total) by mouth daily.   ONE TOUCH ULTRA TEST test strip USE UP TO FOUR TIMES DAILY AS DIRECTED (Patient taking differently: 1 each by Other route 4 (four) times daily.)  oxybutynin (DITROPAN-XL) 10 MG 24 hr tablet Take 1 tablet (10 mg total) by mouth at bedtime.   pantoprazole (PROTONIX) 40 MG tablet Take 1 tablet (40 mg total) by mouth 2 (two) times daily before a meal.   SYSTANE HYDRATION PF 0.4-0.3 % SOLN Place 1 drop into both eyes 3 (three) times daily as needed (for dryness).   vitamin B-12 1000 MCG tablet Take 1 tablet (1,000 mcg total) by mouth daily.   No facility-administered encounter  medications on file as of 05/14/2021.    Patient Active Problem List   Diagnosis Date Noted   Symptomatic anemia 05/01/2021   Nonrheumatic tricuspid valve regurgitation 02/24/2021   Acute pulmonary edema (HCC)    Acute combined systolic and diastolic heart failure ([place])    Nonrheumatic mitral valve regurgitation    Severe systolic congestive heart failure ([place]) 02/07/2021   Rapid atrial fibrillation ([place]) 02/07/2021   Atrial fibrillation with RVR ([place]) 02/04/2021   Educated about COVID-19 virus infection 02/02/2020   Iron deficiency anemia due to chronic blood loss    Occult blood in stools    Unprovoked DVT and Unprovoked Pulmonary Embolism -Dxed 03/2019 04/03/2019   Sciatica of right side 10/18/2018   Seborrheic keratosis 10/17/2016   Hyperlipidemia associated with type 2 diabetes mellitus ([place]) 10/17/2016   Overactive bladder 10/17/2016   Gastroesophageal reflux disease without esophagitis 10/17/2016   Primary osteoarthritis involving multiple joints 10/17/2016   Chronic gout without tophus 10/17/2016   Type 2 diabetes mellitus without complication, without long-term current use of insulin ([place]) 05/16/2016   Essential hypertension 05/16/2016   Hypothyroidism 05/16/2016   Anemia 10/30/2013   Morbid obesity ([place]) 10/30/2013   S/P right THA, AA 10/29/2013    Conditions to be addressed/monitored:Atrial Fibrillation, HTN, DMII, and anemia  Care Plan : [facility] Care Plan  Updates made by Ilean China, RN since 05/17/2021 12:00 AM     Problem: Chronic Disease Management Needs (DM, HTN, HLD, Afib, chronic anticoagulation)   Priority: Medium     Long-Range Goal: Work with [place] Coordination Regarding Chronic Medical Conditions   Start Date: 02/02/2021  This Visit's Progress: On track  Recent Progress: On track  Priority: High  Note:   Current Barriers:  Chronic Disease Management support and education needs related to Atrial Fibrillation, HTN, HLD, and  DMII  RNCM Clinical Goal(s):  Patient will work with RN Case Manager to address needs related to Atrial Fibrillation, HTN, HLD, and DMII and recent hospitalization for septic shock secondary to kidney stone and UTI demonstrate ongoing self health care management ability through collaboration with RN Care manager, provider, and care team.   Interventions: 1:1 collaboration with Loman Brooklyn, FNP regarding development and update of comprehensive plan of care as evidenced by provider attestation and co-signature  Inter-disciplinary care team collaboration (see longitudinal plan of care) Reviewed chart including relevant office notes, lab results, and consultation reports   Diabetes:  (Status: Goal on track: YES.) Lab Results  Component Value Date   HGBA1C 5.1 05/02/2021   HGBA1C 5.7 (H) 12/25/2020   HGBA1C 5.7 10/20/2020   Lab Results  Component Value Date   LDLCALC 66 02/07/2021   CREATININE 1.70 (H) 05/10/2021  Assessed patient's understanding of A1c goal: <7% Reviewed and discussed most recent A1C Reviewed medications with patient and discussed importance of medication adherence; Counseled on importance of regular laboratory monitoring as prescribed; Discussed plans with patient for ongoing care management follow up and provided patient with direct contact  information for care management team; Reviewed scheduled/upcoming provider appointments including:  ; Encouraged to continue to follow an ADA/carb modified diet  Hypertension: (Status: Goal on track: YES.) Last practice recorded BP readings:  BP Readings from Last 3 Encounters:  05/10/21 130/74  05/04/21 (!) 176/83  04/30/21 136/62   Evaluation of current treatment plan related to hypertension self management and patient's adherence to plan as established by provider; Reviewed medications with patient and discussed importance of compliance; Discussed plans with patient for ongoing care management follow up and provided  patient with direct contact information for care management team; Advised patient, providing education and rationale, to monitor blood pressure daily and record, calling PCP for findings outside established parameters;  Reviewed scheduled/upcoming provider appointments including:    Reviewed and discussed recent office visit with cardiologist  Questioned mobility and physical activity level. Both are improving. Physical and occupational therapy have ended. Continues to use a walker from time to time but is mostly ambulating on her own without difficulty. She is able to perform ADLs independently.  Discussed oxygen use. Patient is not using oxygen and is waiting for supplier to pick it up. O2 levels have been normal during the day and night.   HDL:  (Status: Goal on track: YES.) Lab Results  Component Value Date   CHOL 108 02/07/2021   HDL 29 (L) 02/07/2021   LDLCALC 66 02/07/2021   TRIG 65 02/07/2021   CHOLHDL 3.7 02/07/2021   Evaluation of current treatment plan related to HLD self-management and patient's adherence to plan as established by provider. Discussed plans with patient for ongoing care management follow up and provided patient with direct contact information for care management team Reviewed and discussed recent lab results Reviewed and discussed medications and compliance Encouraged patient to continue increasing physical activity level as tolerated  A-fib:  (Status: Goal on track: YES.) Afib action plan reviewed Assessed social determinant of health barriers Discussed prescription assistance approval for Eliquis. Approved until 07/24/21. Received shipment. Reviewed and discussed recent appointment with Jory Sims, NP (cardiology) on 04/30/21 to discuss results of echo.  Reviewed order for transeophogeal echo. That has been cancelled for now due to need for endoscopy to evaluate anemia. Discussed symptoms of Afib. Denies any feeling of palpitations or fluttering.  Discussed use of metoprolol succinate 59m daily to control heart rate. Patient hasn't experienced any episodes of tachycardia but concerned about bradycardia. She does check her pulse ox and heart rate multiple times a day.  Encouraged to continue checking and recording hear rate daily and PRN and to call Cardio with any readings outside of recommended range Discussed home readings. HR averaging in the 50s now. Has not had any episodes in the 30s since we last discussed this Advised patient to continue to monitor heart rate and symptoms and to reach out to cardio or seek emergency medical attention if needed [facility]  Anemia/Bleeding:  (Status: New goal.) Assessment of understanding of anemia/bleeding disorder diagnosis Basic overview and discussion of anemia/bleeding disorder or acute disease state Review of most recent labs:  CBC Latest Ref Rng & Units 05/10/2021 05/04/2021 05/03/2021  WBC 3.4 - 10.8 x10E3/uL 5.5 5.6 4.7  Hemoglobin 11.1 - 15.9 g/dL 9.6(L) 7.7(L) 7.4(L)  Hematocrit 34.0 - 46.6 % 30.6(L) 25.6(L) 23.6(L)  Platelets 150 - 450 x10E3/uL 260 236 199  Medications reviewed Counseled on bleeding risk associated with eliquis and importance of self-monitoring for signs/symptoms of bleeding; Counseled on avoidance of NSAIDs due  to increased bleeding risk; Counseled on importance of regular laboratory monitoring as directed by provider; Provided education about signs and symptoms of active bleeding such as stomach discomfort, coughing up blood or blood tinged secretions, bleeding from the gums/teeth, nosebleeds, increased bruising, blood in the urine/stool and/or if a traumatic injury occurs, regardless of severity of injury;  Advised to call provider or 911 if active bleeding or signs and symptoms of active bleeding occur; recommended promotion of rest and energy-conserving measures to manage fatigue, such as balancing activity with periods of  rest encouraged strategies to prevent falls related to fatigue, weakness and dizziness; encouraged sitting before standing and using an assistive device encouraged optimal oral intake to support fluid balance and nutrition discussed recent hospitalization for symptomatic anemia  Reviewed and discussed upcoming appointment with GI, Dr Watt Climes, for an endoscopy on 05/18/21 Discussed Givens Capsule Study  Acute Eyelid Problem:  (Status: Goal on track: YES.) Talked with patient by telephone regarding bilateral eyelid irritation, itching, and swelling. First noticed it 2 days ago. Has not used any different medications, eye drops, facial products, etc. Has used systane eye drops as usual. Advised that some eye drops have preservative or added ingredients that she may be allergic to.  Does not have any eyeball symptoms. Eyelid symptoms are improving at this point Discussed possibly of blepharitis. Denies any dryness or flaking.  Encouraged to continue daily Claritin. Can take a benadryl at night if needed. Advised that it may make her drowsy. Clean with gentle cleanser and pat dry. Can put a thin layer of Vaseline on eyelid to see if that will help with irritation Advised to see a provider if symptoms get worse or don't improve  Patient Goals/Self-Care Activities: Patient will self administer medications as prescribed Patient will attend all scheduled provider appointments Patient will continue to perform ADL's independently Patient will call provider office for new concerns or questions Patient will take medication as directed. Call PCP or seek appropriate medical attention as needed for new or worsening symptoms Patient will keep appt for endoscopy procedure Patient will seek appropriate medical attention for any s/s of bleeding or anemia Patient will monitor heart rate and call cardiologist with any readings outside of recommended range Patient will check and record blood pressure daily and call  PCP or cardio with any readings outside of recommended range Patient will check and record blood sugar daily and call PCP with any readings outside of recommended range      Plan:Telephone follow up appointment with care management team member scheduled for:  05/27/21 with RNCM The patient has been provided with contact information for the care management team and has been advised to call with any health related questions or concerns.   Chong Sicilian, BSN, RN-BC Embedded Chronic Care Manager [facility] / [place] [facility] Direct Dial: [phone]

## 2021-05-17 NOTE — Addendum Note (Signed)
Addended by: Loman Brooklyn on: 05/17/2021 09:25 AM   Modules accepted: Orders

## 2021-05-17 NOTE — Patient Instructions (Signed)
Visit Information  Patient Care Plan: RNCM: Iron Deficiency (Adult)  Completed 02/02/2021   Problem Identified: Iron Deficiency Resolved 02/02/2021  Priority: Medium  Note:   In a patient with HTN, HLD, DM, hypothyroidism, OA, osteoporosis     Long-Range Goal: Anemia Prevented or Managed Completed 02/02/2021  Start Date: 11/05/2020  Recent Progress: On track  Priority: Medium  Note:   Objective: Lab Results  Component Value Date   WBC 6.3 10/20/2020   HGB 13.7 10/20/2020   HCT 41.3 10/20/2020   MCV 93 10/20/2020   PLT 211 10/20/2020   Lab Results  Component Value Date   IRON 98 10/20/2020   TIBC 350 10/20/2020   FERRITIN 54 10/20/2020  Current Barriers:  Chronic Disease Management support and education needs related to anemia On Eliquis for Afib Neg colonoscopy and endosocpy  Nurse Case Manager Clinical Goal(s):  patient will take all medications exactly as prescribed and will call provider for medication related questions Patient will work with gastroenterologist to address needs related to anemia Patient will reach out to [place] as needed to assist with care coordination  Interventions:  Collaboration with Loman Brooklyn, FNP regarding development and update of comprehensive plan of care as evidenced by provider attestation and co-signature Inter-disciplinary care team collaboration (see longitudinal plan of care) Basic overview and discussion of anemia/bleeding disorder or acute disease state Medications reviewed and discussed Recent office visit notes, imaging reports, and lab results reviewed and discussed Hemoglobin and iron are WNL now Holding off on additional imaging studies for now since labs ar WNL encouraged optimal oral intake to support fluid balance and nutrition encouraged dietary changes to increase dietary intake of iron, Vitamin B76 and folic acid as advised/prescribed Monitored for constipation; encourage increased fluid and fiber intake,  and use of stool softeners or laxatives.  Reviewed upcoming appointments with GI and PCP Advised to call GI or PCP with any new or worsening symptoms and to seek appropriate medical care as needed Provided with RNCM contact information and encouraged to reach out as needed  Patient Goals/Self-Care Activities: don't eat dairy products, such as milk, cheese and ice cream with those high in iron eat dark, leafy greens, such as spinach, chard and collards include high-iron food, such as meat, chicken, fish, shellfish, dried fruit, beans and nuts in each meal pair iron-rich foods with foods high in vitamin C, such as oranges, strawberries and red peppers read food labels for iron, vitamin and fiber  Take medication and supplements as prescribed Avoid constipation by increasing fiber and water intake and by taking a stool softener as needed Keep f/u with GI Call GI or PCP with any new or worsening symptoms  Follow Up Plan:  Telephone follow up appointment with care management team member scheduled for: 12/31/20 with RNCM The patient has been provided with contact information for the care management team and has been advised to call with any health related questions or concerns.  Next PCP appointment scheduled for: 01/21/21 with [name], FNP Next GI appointment scheduled for: 05/18/21       Patient Care Plan: RNCM: Care Coordinatino after Hospitalization  Completed 02/02/2021   Problem Identified: Care Coordination Needs s/p Hospitalization for Sepsis secondary to UTI Resolved 02/02/2021  Priority: High     Goal: Follow Instructions for Post Discharge Care Completed 02/02/2021  Start Date: 12/31/2020  Recent Progress: On track  Priority: High  Note:   Current Barriers:  Care Coordination needs related to post hospital discharge  care in a patient with recent sepsis secondary to UTI Unable to independently stay by herself presently Comorbidities: HTN, HLD, DM, hypothyroidism, OA,  osteoporosis, anemia  Nurse Case Manager Clinical Goal(s):  patient will work with urologist to address needs related to recent sepsis secondary to UTI and renal stone patient will meet with RN Care Manager to address self-management of chronic medical conditions and ADLs the patient will demonstrate ongoing self health care management ability as evidenced by attending all scheduled medical appointments and by reaching out to PCP or specialist with any new or worsening symptoms*  Interventions:  1:1 collaboration with Loman Brooklyn, FNP regarding development and update of comprehensive plan of care as evidenced by provider attestation and co-signature Inter-disciplinary care team collaboration (see longitudinal plan of care) Chart reviewed including relevant office notes, Hosp notes, correspondence notes, lab results, and imaging reports Evaluation of current treatment plan related to sepsis and patient's adherence to plan as established by provider. Previously reviewed medications with patient and discussed importance of medication adherence Previously discussed hospital stay and discharge instructions Previously reviewed upcoming appointments: Urologist on 01/13/21 for hosp f/u Previously discussed orders for home health physical therapy through [facility] and Oxygen through [facility] Patient has not been contacted by [facility] yet but expects oxygen delivery today. She was sent home with oxygen from the hospital to last until concentrator arrives.  Collaborated with Amy Hopkins, LPN with [facility]'s AWV and TOC clinic, and verified that oxygen was delivered today and that home health physical therapy has scheduled a visit for an evaluation Previously encouraged to seek appropriate medical attention for any new or worsening symptoms Previously encouraged proper hydration Previously discussed plans with patient for ongoing care management follow up and provided patient with direct contact  information for care management team  Self Care Activities:  Self administers medications as prescribed Calls pharmacy for medication refills Calls provider office for new concerns or questions  Patient Goals Over the next 30 days, patient will: Follow-up with urologist Take medication as prescribed Start home health physical therapy Work with [facility] regarding any questions or concerns about oxygen concentrator Seek appropriate medical attention for any new or worsening symptoms Call [place] as needed [phone]  Follow Up Plan:  Telephone follow up appointment with care management team member scheduled for: 02/02/21 with RNCM to verify that oxygen has been delivered and home health has made contact The patient has been provided with contact information for the care management team and has been advised to call with any health related questions or concerns.      Patient Care Plan: [facility] Care Plan     Problem Identified: Chronic Disease Management Needs (DM, HTN, HLD, Afib, chronic anticoagulation)   Priority: Medium     Long-Range Goal: Work with [facility] For Care Management/ Care Coordination Regarding Chronic Medical Conditions   Start Date: 02/02/2021  This Visit's Progress: On track  Recent Progress: On track  Priority: High  Note:   Current Barriers:  Chronic Disease Management support and education needs related to Atrial Fibrillation, HTN, HLD, and DMII  RNCM Clinical Goal(s):  Patient will work with RN Case Manager to address needs related to Atrial Fibrillation, HTN, HLD, and DMII and recent hospitalization for septic shock secondary to kidney stone and UTI demonstrate ongoing self health care management ability through collaboration with RN Care manager, provider, and care team.   Interventions: 1:1 collaboration with Loman Brooklyn, FNP regarding development and update of comprehensive  plan of care as evidenced by provider attestation and co-signature   Inter-disciplinary care team collaboration (see longitudinal plan of care) Reviewed chart including relevant office notes, lab results, and consultation reports   Diabetes:  (Status: Goal on track: YES.) Lab Results  Component Value Date   HGBA1C 5.1 05/02/2021   HGBA1C 5.7 (H) 12/25/2020   HGBA1C 5.7 10/20/2020   Lab Results  Component Value Date   LDLCALC 66 02/07/2021   CREATININE 1.70 (H) 05/10/2021  Assessed patient's understanding of A1c goal: <7% Reviewed and discussed most recent A1C Reviewed medications with patient and discussed importance of medication adherence; Counseled on importance of regular laboratory monitoring as prescribed; Discussed plans with patient for ongoing care management follow up and provided patient with direct contact information for care management team; Reviewed scheduled/upcoming provider appointments including:  ; Encouraged to continue to follow an ADA/carb modified diet  Hypertension: (Status: Goal on track: YES.) Last practice recorded BP readings:  BP Readings from Last 3 Encounters:  05/10/21 130/74  05/04/21 (!) 176/83  04/30/21 136/62   Evaluation of current treatment plan related to hypertension self management and patient's adherence to plan as established by provider; Reviewed medications with patient and discussed importance of compliance; Discussed plans with patient for ongoing care management follow up and provided patient with direct contact information for care management team; Advised patient, providing education and rationale, to monitor blood pressure daily and record, calling PCP for findings outside established parameters;  Reviewed scheduled/upcoming provider appointments including:    Reviewed and discussed recent office visit with cardiologist  Questioned mobility and physical activity level. Both are improving. Physical and occupational therapy have ended. Continues to use a walker from time to time but is mostly  ambulating on her own without difficulty. She is able to perform ADLs independently.  Discussed oxygen use. Patient is not using oxygen and is waiting for supplier to pick it up. O2 levels have been normal during the day and night.   HDL:  (Status: Goal on track: YES.) Lab Results  Component Value Date   CHOL 108 02/07/2021   HDL 29 (L) 02/07/2021   LDLCALC 66 02/07/2021   TRIG 65 02/07/2021   CHOLHDL 3.7 02/07/2021   Evaluation of current treatment plan related to HLD self-management and patient's adherence to plan as established by provider. Discussed plans with patient for ongoing care management follow up and provided patient with direct contact information for care management team Reviewed and discussed recent lab results Reviewed and discussed medications and compliance Encouraged patient to continue increasing physical activity level as tolerated  A-fib:  (Status: Goal on track: YES.) Afib action plan reviewed Assessed social determinant of health barriers Discussed prescription assistance approval for Eliquis. Approved until 07/24/21. Received shipment. Reviewed and discussed recent appointment with Jory Sims, NP (cardiology) on 04/30/21 to discuss results of echo.  Reviewed order for transeophogeal echo. That has been cancelled for now due to need for endoscopy to evaluate anemia. Discussed symptoms of Afib. Denies any feeling of palpitations or fluttering. Discussed use of metoprolol succinate 25mg  daily to control heart rate. Patient hasn't experienced any episodes of tachycardia but concerned about bradycardia. She does check her pulse ox and heart rate multiple times a day.  Encouraged to continue checking and recording hear rate daily and PRN and to call Cardio with any readings outside of recommended range Discussed home readings. HR averaging in the 50s now. Has not had any episodes in the 30s since we last discussed this  Advised patient to continue to monitor heart  rate and symptoms and to reach out to cardio or seek emergency medical attention if needed Encouraged to reach out to [facility] as needed  Anemia/Bleeding:  (Status: New goal.) Assessment of understanding of anemia/bleeding disorder diagnosis Basic overview and discussion of anemia/bleeding disorder or acute disease state Review of most recent labs:  CBC Latest Ref Rng & Units 05/10/2021 05/04/2021 05/03/2021  WBC 3.4 - 10.8 x10E3/uL 5.5 5.6 4.7  Hemoglobin 11.1 - 15.9 g/dL 9.6(L) 7.7(L) 7.4(L)  Hematocrit 34.0 - 46.6 % 30.6(L) 25.6(L) 23.6(L)  Platelets 150 - 450 x10E3/uL 260 236 199  Medications reviewed Counseled on bleeding risk associated with eliquis and importance of self-monitoring for signs/symptoms of bleeding; Counseled on avoidance of NSAIDs due to increased bleeding risk; Counseled on importance of regular laboratory monitoring as directed by provider; Provided education about signs and symptoms of active bleeding such as stomach discomfort, coughing up blood or blood tinged secretions, bleeding from the gums/teeth, nosebleeds, increased bruising, blood in the urine/stool and/or if a traumatic injury occurs, regardless of severity of injury;  Advised to call provider or 911 if active bleeding or signs and symptoms of active bleeding occur; recommended promotion of rest and energy-conserving measures to manage fatigue, such as balancing activity with periods of rest encouraged strategies to prevent falls related to fatigue, weakness and dizziness; encouraged sitting before standing and using an assistive device encouraged optimal oral intake to support fluid balance and nutrition discussed recent hospitalization for symptomatic anemia  Reviewed and discussed upcoming appointment with GI, Dr Watt Climes, for an endoscopy on 05/18/21 Discussed [place] Capsule Study  Acute Eyelid Problem:  (Status: Goal on track: YES.) Talked with patient by telephone regarding bilateral eyelid irritation,  itching, and swelling. First noticed it 2 days ago. Has not used any different medications, eye drops, facial products, etc. Has used systane eye drops as usual. Advised that some eye drops have preservative or added ingredients that she may be allergic to.  Does not have any eyeball symptoms. Eyelid symptoms are improving at this point Discussed possibly of blepharitis. Denies any dryness or flaking.  Encouraged to continue daily Claritin. Can take a benadryl at night if needed. Advised that it may make her drowsy. Clean with gentle cleanser and pat dry. Can put a thin layer of Vaseline on eyelid to see if that will help with irritation Advised to see a provider if symptoms get worse or don't improve  Patient Goals/Self-Care Activities: Patient will self administer medications as prescribed Patient will attend all scheduled provider appointments Patient will continue to perform ADL's independently Patient will call provider office for new concerns or questions Patient will take medication as directed. Call PCP or seek appropriate medical attention as needed for new or worsening symptoms Patient will keep appt for endoscopy procedure Patient will seek appropriate medical attention for any s/s of bleeding or anemia Patient will monitor heart rate and call cardiologist with any readings outside of recommended range Patient will check and record blood pressure daily and call PCP or cardio with any readings outside of recommended range Patient will check and record blood sugar daily and call PCP with any readings outside of recommended range     Patient Care Plan: PHARMD MEDICATION MANAGEMENT     Problem Identified: DISEASE PROGRESSION PREVENTION      Long-Range Goal: ATRIAL FIBRILLATION   This Visit's Progress: On track  Priority: High  Note:   Current Barriers:  Unable to independently afford treatment  regimen Does not adhere to prescribed medication regimen-DUE TO COST   Pharmacist  Clinical Goal(s):  Over the next 90 days, patient will verbalize ability to afford treatment regimen adhere to prescribed medication regimen as evidenced by [place]  through collaboration with PharmD and provider.    Interventions: 1:1 collaboration with Loman Brooklyn, FNP regarding development and update of comprehensive plan of care as evidenced by provider attestation and co-signature Inter-disciplinary care team collaboration (see longitudinal plan of care) Comprehensive medication review performed; medication list updated in electronic medical record  Atrial Fibrillation: Controlled; current rate/rhythm control: METOP; anticoagulant treatment: ELIQUIS PATIENT DENIES SIGNS & SYMPTOMS OF BLEEDING--TOLERATING WELL UNABLE TO AFFORD HIGH COPAY OF ELIQUIS CBC Latest Ref Rng & Units 02/10/2021 02/09/2021 02/08/2021  WBC 4.0 - 10.5 K/uL 6.3 5.5 6.4  Hemoglobin 12.0 - 15.0 g/dL 8.5(L) 8.1(L) 7.7(L)  Hematocrit 36.0 - 46.0 % 29.3(L) 28.1(L) 25.6(L)  Platelets 150 - 400 K/uL 279 249 249  CHADS2VASc score: 6 Home blood pressure, heart rate readings: 120-130s/70-80s Educated on ELIQUIS-PURPOSE AND SIDE EFFECTS Assessed patient finances. Will attempt to get eliquis covered under patient assistance via [place]; patient to provide income documents & out of pocket spend today--samples left up front for patient   Patient Goals/Self-Care Activities Over the next 90 days, patient will:  - take medications as prescribed  Follow Up Plan: Telephone follow up appointment with care management team member scheduled for: 1 month       Patient verbalizes understanding of instructions provided today and agrees to view in [place].   Telephone follow up appointment with care management team member scheduled for:05/28/21 with RNCM  Chong Sicilian, BSN, RN-BC [place] [facility] [place] Management Direct Dial:  [phone]

## 2021-05-18 ENCOUNTER — Other Ambulatory Visit: Payer: Self-pay

## 2021-05-18 ENCOUNTER — Ambulatory Visit (HOSPITAL_COMMUNITY): Payer: Medicare Other | Admitting: Certified Registered Nurse Anesthetist

## 2021-05-18 ENCOUNTER — Ambulatory Visit: Payer: Medicare Other | Admitting: Gastroenterology

## 2021-05-18 ENCOUNTER — Ambulatory Visit (HOSPITAL_COMMUNITY)
Admission: RE | Admit: 2021-05-18 | Discharge: 2021-05-18 | Disposition: A | Discharge status: Home / Self Care | Payer: Medicare Other | Attending: Gastroenterology | Admitting: Gastroenterology

## 2021-05-18 ENCOUNTER — Encounter (HOSPITAL_COMMUNITY): Payer: Self-pay | Admitting: Gastroenterology

## 2021-05-18 ENCOUNTER — Encounter (HOSPITAL_COMMUNITY)
Admission: RE | Disposition: A | Discharge status: Home / Self Care | Payer: Self-pay | Source: Home / Self Care | Attending: Gastroenterology

## 2021-05-18 DIAGNOSIS — R195 Other fecal abnormalities: Secondary | ICD-10-CM | POA: Diagnosis not present

## 2021-05-18 DIAGNOSIS — Z86718 Personal history of other venous thrombosis and embolism: Secondary | ICD-10-CM | POA: Diagnosis not present

## 2021-05-18 DIAGNOSIS — Z7901 Long term (current) use of anticoagulants: Secondary | ICD-10-CM | POA: Diagnosis not present

## 2021-05-18 DIAGNOSIS — Z79899 Other long term (current) drug therapy: Secondary | ICD-10-CM | POA: Diagnosis not present

## 2021-05-18 DIAGNOSIS — M1990 Unspecified osteoarthritis, unspecified site: Secondary | ICD-10-CM | POA: Diagnosis not present

## 2021-05-18 DIAGNOSIS — K449 Diaphragmatic hernia without obstruction or gangrene: Secondary | ICD-10-CM | POA: Insufficient documentation

## 2021-05-18 DIAGNOSIS — E119 Type 2 diabetes mellitus without complications: Secondary | ICD-10-CM | POA: Diagnosis not present

## 2021-05-18 DIAGNOSIS — K31811 Angiodysplasia of stomach and duodenum with bleeding: Secondary | ICD-10-CM | POA: Diagnosis not present

## 2021-05-18 DIAGNOSIS — Z6834 Body mass index (BMI) 34.0-34.9, adult: Secondary | ICD-10-CM | POA: Insufficient documentation

## 2021-05-18 DIAGNOSIS — K31819 Angiodysplasia of stomach and duodenum without bleeding: Secondary | ICD-10-CM | POA: Insufficient documentation

## 2021-05-18 DIAGNOSIS — I110 Hypertensive heart disease with heart failure: Secondary | ICD-10-CM | POA: Insufficient documentation

## 2021-05-18 DIAGNOSIS — I4891 Unspecified atrial fibrillation: Secondary | ICD-10-CM | POA: Insufficient documentation

## 2021-05-18 DIAGNOSIS — Z86711 Personal history of pulmonary embolism: Secondary | ICD-10-CM | POA: Insufficient documentation

## 2021-05-18 DIAGNOSIS — D500 Iron deficiency anemia secondary to blood loss (chronic): Secondary | ICD-10-CM | POA: Diagnosis not present

## 2021-05-18 DIAGNOSIS — I509 Heart failure, unspecified: Secondary | ICD-10-CM | POA: Insufficient documentation

## 2021-05-18 DIAGNOSIS — E039 Hypothyroidism, unspecified: Secondary | ICD-10-CM | POA: Insufficient documentation

## 2021-05-18 DIAGNOSIS — K219 Gastro-esophageal reflux disease without esophagitis: Secondary | ICD-10-CM | POA: Diagnosis not present

## 2021-05-18 HISTORY — PX: ESOPHAGOGASTRODUODENOSCOPY (EGD) WITH PROPOFOL: SHX5813

## 2021-05-18 HISTORY — PX: RADIOFREQUENCY ABLATION: SHX2290

## 2021-05-18 LAB — GLUCOSE, CAPILLARY: Glucose-Capillary: 95 mg/dL (ref 70–99)

## 2021-05-18 SURGERY — ESOPHAGOGASTRODUODENOSCOPY (EGD) WITH PROPOFOL
Anesthesia: Monitor Anesthesia Care

## 2021-05-18 MED ORDER — HYDRALAZINE HCL 20 MG/ML IJ SOLN
5.0000 mg | Freq: Once | INTRAMUSCULAR | Status: AC
  Administered 2021-05-18: 5 mg via INTRAVENOUS

## 2021-05-18 MED ORDER — LIDOCAINE 2% (20 MG/ML) 5 ML SYRINGE
INTRAMUSCULAR | Status: DC | PRN
  Administered 2021-05-18: 60 mg via INTRAVENOUS

## 2021-05-18 MED ORDER — LACTATED RINGERS IV SOLN
INTRAVENOUS | Status: DC
  Administered 2021-05-18: 1000 mL via INTRAVENOUS

## 2021-05-18 MED ORDER — PROPOFOL 10 MG/ML IV BOLUS
INTRAVENOUS | Status: DC | PRN
  Administered 2021-05-18: 50 mg via INTRAVENOUS
  Administered 2021-05-18 (×3): 20 mg via INTRAVENOUS

## 2021-05-18 MED ORDER — PROPOFOL 500 MG/50ML IV EMUL
INTRAVENOUS | Status: AC
  Filled 2021-05-18: qty 100

## 2021-05-18 MED ORDER — HYDRALAZINE HCL 20 MG/ML IJ SOLN
INTRAMUSCULAR | Status: AC
  Filled 2021-05-18: qty 1

## 2021-05-18 MED ORDER — PROPOFOL 500 MG/50ML IV EMUL
INTRAVENOUS | Status: DC | PRN
  Administered 2021-05-18: 100 ug/kg/min via INTRAVENOUS

## 2021-05-18 MED ORDER — SODIUM CHLORIDE 0.9 % IV SOLN: INTRAVENOUS | Status: DC

## 2021-05-18 SURGICAL SUPPLY — 14 items

## 2021-05-18 NOTE — Transfer of Care (Signed)
Immediate Anesthesia Transfer of Care Note  Patient: [name] [name]  Procedure(s) Performed: Procedure(s) with comments: ESOPHAGOGASTRODUODENOSCOPY (EGD) WITH PROPOFOL (N/A) - RFA; APC; ultraslim scope RADIO FREQUENCY ABLATION  Patient Location: PACU  Anesthesia Type:MAC  Level of Consciousness: Patient easily awoken, sedated, comfortable, cooperative, following commands, responds to stimulation.   Airway & Oxygen Therapy: Patient spontaneously breathing, ventilating well, oxygen via simple oxygen mask.  Post-op Assessment: Report given to PACU RN, vital signs reviewed and stable, moving all extremities.   Post vital signs: Reviewed and stable.  Complications: No apparent anesthesia complications  Last Vitals:  Vitals Value Taken Time  BP    Temp    Pulse    Resp    SpO2      Last Pain:  Vitals:   05/18/21 1301  TempSrc: Oral         Complications: No notable events documented.

## 2021-05-18 NOTE — Progress Notes (Signed)
[name] [name] 1:27 PM  Subjective: Patient doing well without any signs of bleeding and without any GI complaints we reviewed her capsule endoscopy and we discussed the procedure Objective: Vital signs stable afebrile exam please see preassessment evaluation follow-up hemoglobin increased  Assessment: Subacute GI blood loss and patient on blood thinners with positive capsule endoscopy x2  Plan: Okay to proceed with endoscopy and probable enteroscopy with possible APC and or RFA with anesthesia assistance  [facility]  office [phone] After 5PM or if no answer call [phone]

## 2021-05-18 NOTE — Progress Notes (Signed)
Pt to Endo Pacu s/p upper endoscopy, Dr Gloris Manchester to bedside to evaluate irregular ekg rhythm and elevated bp. Ekg similar to most recent 12 lead (04-30-21), and intra-procedural rhythm, no new 12 lead needed per md. Pt reports feeling well and states irregular rhythm and low rate are normal for her. Hydralazine 5mg  IV ordered

## 2021-05-18 NOTE — Progress Notes (Signed)
SBP 180-190/s DBP 50-60's heart rate and rhythm unchanged. Preprocedure BP 206/61. Called Dr Elgie Congo with update on patient's vitals and status, may DC home per Dr Elgie Congo.

## 2021-05-18 NOTE — Anesthesia Procedure Notes (Signed)
Procedure Name: MAC Date/Time: 05/18/2021 2:05 PM Performed by: Deliah [place], CRNA Pre-anesthesia Checklist: Patient identified, Emergency Drugs available, Suction available and Patient being monitored Patient Re-evaluated:Patient Re-evaluated prior to induction Oxygen Delivery Method: Simple face mask Induction Type: IV induction Placement Confirmation: positive ETCO2 and breath sounds checked- equal and bilateral

## 2021-05-18 NOTE — Discharge Instructions (Addendum)
Start with liquids only for 4 hours and if doing well this evening may have soft solids and may advance diet tomorrow and if doing well on Friday morning may begin Eliquis back and periodically have primary care doctor check hemoglobin and follow-up with me as needed or in a few months but call sooner if GI question or problem  YOU HAD AN ENDOSCOPIC PROCEDURE TODAY: Refer to the procedure report and other information in the discharge instructions given to you for any specific questions about what was found during the examination. If this information does not answer your questions, please call [place] office at [phone] to clarify.   YOU SHOULD EXPECT: Some feelings of bloating in the abdomen. Passage of more gas than usual. Walking can help get rid of the air that was put into your GI tract during the procedure and reduce the bloating. If you had a lower endoscopy (such as a colonoscopy or flexible sigmoidoscopy) you may notice spotting of blood in your stool or on the toilet paper. Some abdominal soreness may be present for a day or two, also.  DIET: Your first meal following the procedure should be a light meal and then it is ok to progress to your normal diet. A half-sandwich or bowl of soup is an example of a good first meal. Heavy or fried foods are harder to digest and may make you feel nauseous or bloated. Drink plenty of fluids but you should avoid alcoholic beverages for 24 hours. If you had a esophageal dilation, please see attached instructions for diet.    ACTIVITY: Your care partner should take you home directly after the procedure. You should plan to take it easy, moving slowly for the rest of the day. You can resume normal activity the day after the procedure however YOU SHOULD NOT DRIVE, use power tools, machinery or perform tasks that involve climbing or major physical exertion for 24 hours (because of the sedation medicines used during the test).   SYMPTOMS TO REPORT IMMEDIATELY: A  gastroenterologist can be reached at any hour. Please call [phone]  for any of the following symptoms:  Following upper endoscopy (EGD, EUS, ERCP, esophageal dilation) Vomiting of blood or coffee ground material  New, significant abdominal pain  New, significant chest pain or pain under the shoulder blades  Painful or persistently difficult swallowing  New shortness of breath  Black, tarry-looking or red, bloody stools  FOLLOW UP:  If any biopsies were taken you will be contacted by phone or by letter within the next 1-3 weeks. Call [phone]  if you have not heard about the biopsies in 3 weeks.  Please also call with any specific questions about appointments or follow up tests.

## 2021-05-18 NOTE — Op Note (Signed)
[facility] Patient Name: [name] [name] Procedure Date: 05/18/2021 MRN: [id number] Attending MD: Clarene Essex , MD Date of Birth: Jan 06, 1941 CSN: [id number] Age: 80 Admit Type: Outpatient Procedure:                Upper GI endoscopy Indications:              Iron deficiency anemia secondary to chronic blood                            loss, Heme positive stool abnormal capsule x2                            questionable small bowel AVM Providers:                Clarene Essex, MD, Dulcy Fanny, Luan Moore,                            Technician, Heide Scales, CRNA Referring MD:              Medicines:                Propofol total dose 350 mg IV, 60 mg IV lidocaine Complications:            No immediate complications. Estimated Blood Loss:     Estimated blood loss was minimal. Procedure:                Pre-Anesthesia Assessment:                           - Prior to the procedure, a History and Physical                            was performed, and patient medications and                            allergies were reviewed. The patient's tolerance of                            previous anesthesia was also reviewed. The risks                            and benefits of the procedure and the sedation                            options and risks were discussed with the patient.                            All questions were answered, and informed consent                            was obtained. Prior Anticoagulants: The patient                            last took Eliquis (apixaban) 3 days prior to the  procedure. ASA Grade Assessment: III - A patient                            with severe systemic disease. After reviewing the                            risks and benefits, the patient was deemed in                            satisfactory condition to undergo the procedure.                           After obtaining informed consent, the endoscope was                             passed under direct vision. Throughout the                            procedure, the patient's blood pressure, pulse, and                            oxygen saturations were monitored continuously. The                            PCF-H190TL ([id number]) Olympus slim colonoscope was                            introduced through the mouth, and advanced to the                            mid-jejunum. Unfortunately the scope began to loop                            and we could not advance any further and since no                            proximal AVMs were seen we switched to the regular                            endoscope and proceeded with RFA as below and the                            upper GI endoscopy was accomplished without                            difficulty. The patient tolerated the procedure                            well. Scope In: Scope Out: Findings:      A tiny hiatal hernia was present.      Localized gastric antral vascular ectasia was present in the gastric       antrum. Focal radiofrequency ablation of gastric antral vascular ectasia  in the stomach was performed. Ablation sites were located at 32 cm from       the incisors. With the endoscope in place, the position and extent of       the abnormal mucosa and appropriate anatomic landmarks were noted. The       abnormal mucosa was irrigated with saline. The radiofrequency channel       ablation catheter was introduced through the endoscope working channel.       The endoscope with the ablation catheter was advanced to the areas of       abnormal mucosa. The endoscope with the channel ablation catheter was       positioned under direct visualization so that the catheter was placed in       contact with the surface of the abnormal mucosa. Energy was applied       twice at 12 J/cm2. Ablation was repeated in a likewise fashion to all       visible abnormal mucosa. The channel ablation catheter  was then removed       through the endoscope working channel, and the ablation catheter was       cleaned. The endoscope was left in place. The ablation zone was cleaned       of coagulative debris. The ablation catheter was reinserted into the       endoscope working channel. A second round of ablation was then       performed. Energy was applied twice at 12 J/cm2 to retreat the areas of       abnormal mucosa that had been treated with the first series of ablation.       The ablation catheter was removed through the endoscope working channel.       The areas where abnormal mucosa had been ablated were examined. Whitish       changes of ablated mucosa were present. The endoscope was then removed.      The examined duodenum was normal.      The examined jejunum was normal.      The exam was otherwise without abnormality. Impression:               - Tiny hiatal hernia.                           - Gastric antral vascular ectasia. Treated with                            radiofrequency ablation.                           - Normal examined duodenum.                           - Normal examined jejunum.                           - The examination was otherwise normal.                           - No specimens collected. Moderate Sedation:      Not Applicable - Patient had care per Anesthesia. Recommendation:           - Patient has a contact number  available for                            emergencies. The signs and symptoms of potential                            delayed complications were discussed with the                            patient. Return to normal activities tomorrow.                            Written discharge instructions were provided to the                            patient.                           - Full liquid diet for 4 hours.                           - Continue present medications.                           - Return to GI clinic PRN. Periodic hemoglobin                             checks per primary care                           - Telephone GI clinic if symptomatic PRN.                           - Repeat upper endoscopy PRN to evaluate the                            response to therapy.                           - Resume Eliquis (apixaban) at prior dose in 3 days. Procedure Code(s):        --- Professional ---                           [phone], Esophagogastroduodenoscopy, flexible,                            transoral; with control of bleeding, any method Diagnosis Code(s):        --- Professional ---                           K44.9, Diaphragmatic hernia without obstruction or                            gangrene                           K31.819,  Angiodysplasia of stomach and duodenum                            without bleeding                           D50.0, Iron deficiency anemia secondary to blood                            loss (chronic)                           R19.5, Other fecal abnormalities CPT copyright 2019 [employer] Association. All rights reserved. The codes documented in this report are preliminary and upon coder review may  be revised to meet current compliance requirements. Clarene Essex, MD 05/18/2021 2:43:43 PM This report has been signed electronically. Number of Addenda: 0

## 2021-05-18 NOTE — Anesthesia Preprocedure Evaluation (Signed)
Anesthesia Evaluation  Patient identified by MRN, date of birth, ID band Patient awake    Reviewed: Allergy & Precautions, NPO status , Patient's Chart, lab work & pertinent test results  Airway Mallampati: II  TM Distance: >3 FB Neck ROM: Full    Dental no notable dental hx.    Pulmonary neg pulmonary ROS,    Pulmonary exam normal        Cardiovascular hypertension, Pt. on medications and Pt. on home beta blockers +CHF and + DVT  + dysrhythmias (on Eliquis) Atrial Fibrillation  Rhythm:Irregular     Neuro/Psych  Headaches, negative psych ROS   GI/Hepatic Neg liver ROS, GERD  Medicated,GIB   Endo/Other  diabetesHypothyroidism   Renal/GU negative Renal ROS  negative genitourinary   Musculoskeletal  (+) Arthritis ,   Abdominal (+)  Abdomen: soft. Bowel sounds: normal.  Peds  Hematology  (+) anemia ,   Anesthesia Other Findings   Reproductive/Obstetrics                             Anesthesia Physical Anesthesia Plan  ASA: 3  Anesthesia Plan: MAC   Post-op Pain Management:    Induction: Intravenous  PONV Risk Score and Plan: 2 and Treatment may vary due to age or medical condition and Propofol infusion  Airway Management Planned: Simple Face Mask, Natural Airway and Nasal Cannula  Additional Equipment: None  Intra-op Plan:   Post-operative Plan:   Informed Consent: I have reviewed the patients History and Physical, chart, labs and discussed the procedure including the risks, benefits and alternatives for the proposed anesthesia with the patient or authorized representative who has indicated his/her understanding and acceptance.     Dental advisory given  Plan Discussed with: CRNA  Anesthesia Plan Comments:         Anesthesia Quick Evaluation

## 2021-05-19 ENCOUNTER — Encounter (HOSPITAL_COMMUNITY): Payer: Self-pay | Admitting: Gastroenterology

## 2021-05-19 NOTE — Anesthesia Postprocedure Evaluation (Signed)
Anesthesia Post Note  Patient: [name] [name]  Procedure(s) Performed: ESOPHAGOGASTRODUODENOSCOPY (EGD) WITH PROPOFOL RADIO FREQUENCY ABLATION     Patient location during evaluation: Endoscopy Anesthesia Type: MAC Level of consciousness: awake and alert Pain management: pain level controlled Vital Signs Assessment: post-procedure vital signs reviewed and stable Respiratory status: spontaneous breathing, nonlabored ventilation, respiratory function stable and patient connected to nasal cannula oxygen Cardiovascular status: stable and blood pressure returned to baseline Postop Assessment: no apparent nausea or vomiting Anesthetic complications: no   No notable events documented.  Last Vitals:  Vitals:   05/18/21 1510 05/18/21 1520  BP: (!) 194/62 (!) 192/50  Pulse: (!) 50 62  Resp: (!) 22 18  Temp:    SpO2: 100% 96%    Last Pain:  Vitals:   05/18/21 1520  TempSrc:   PainSc: 0-No pain                 Belenda Cruise P Madinah Quarry

## 2021-05-20 ENCOUNTER — Telehealth: Payer: Medicare Other

## 2021-05-24 DIAGNOSIS — I4891 Unspecified atrial fibrillation: Secondary | ICD-10-CM

## 2021-05-24 DIAGNOSIS — E119 Type 2 diabetes mellitus without complications: Secondary | ICD-10-CM

## 2021-05-24 DIAGNOSIS — I10 Essential (primary) hypertension: Secondary | ICD-10-CM | POA: Diagnosis not present

## 2021-05-24 DIAGNOSIS — D649 Anemia, unspecified: Secondary | ICD-10-CM

## 2021-05-27 ENCOUNTER — Ambulatory Visit: Payer: Medicare Other | Admitting: *Deleted

## 2021-05-27 ENCOUNTER — Encounter: Payer: Self-pay | Admitting: *Deleted

## 2021-05-27 DIAGNOSIS — I4891 Unspecified atrial fibrillation: Secondary | ICD-10-CM

## 2021-05-27 DIAGNOSIS — I10 Essential (primary) hypertension: Secondary | ICD-10-CM

## 2021-05-27 DIAGNOSIS — E119 Type 2 diabetes mellitus without complications: Secondary | ICD-10-CM

## 2021-05-27 NOTE — Chronic Care Management (AMB) (Signed)
Chronic Care Management   CCM RN Visit Note  05/27/2021 Name: [name] [name] MRN: [id number] DOB: 10-Jun-1941  Subjective: [name] [name] is a 80 y.o. year old female who is a primary care patient of Loman Brooklyn, FNP. The care management team was consulted for assistance with disease management and care coordination needs.    Engaged with patient by telephone for follow up visit in response to provider referral for case management and/or care coordination services.   Consent to Services:  The patient was given information about Chronic Care Management services, agreed to services, and gave verbal consent prior to initiation of services.  Please see initial visit note for detailed documentation.   Patient agreed to services and verbal consent obtained.   Assessment: Review of patient past medical history, allergies, medications, health status, including review of consultants reports, laboratory and other test data, was performed as part of comprehensive evaluation and provision of chronic care management services.   SDOH (Social Determinants of Health) assessments and interventions performed:    CCM Care Plan  No Known Allergies  Outpatient Encounter Medications as of 05/27/2021  Medication Sig   acetaminophen (TYLENOL) 325 MG tablet Take 2 tablets (650 mg total) by mouth every 6 (six) hours as needed for mild pain or headache (or Fever >/= 101).   allopurinol (ZYLOPRIM) 100 MG tablet Take 1 tablet (100 mg total) by mouth daily.   amLODipine (NORVASC) 2.5 MG tablet Take 1 tablet (2.5 mg total) by mouth daily.   apixaban (ELIQUIS) 5 MG TABS tablet Take 1 tablet (5 mg total) by mouth 2 (two) times daily.   atorvastatin (LIPITOR) 20 MG tablet Take 1 tablet (20 mg total) by mouth daily.   Cholecalciferol (VITAMIN D-3) 125 MCG (5000 UT) TABS Take 5,000 Units by mouth daily with breakfast.   diclofenac Sodium (VOLTAREN) 1 % GEL APPLY 4 GRAMS TOPICALLY 4  TIMES DAILY (Patient  taking differently: Apply 2-4 g topically 4 (four) times daily as needed (for bilateral knee pain).)   EUTHYROX 50 MCG tablet TAKE 1 TABLET BY MOUTH ONCE DAILY BEFORE BREAKFAST (Patient taking differently: Take 50 mcg by mouth daily before breakfast.)   ferrous sulfate 325 (65 FE) MG EC tablet Take 1 tablet (325 mg total) by mouth 2 (two) times daily.   furosemide (LASIX) 20 MG tablet Take 1 tablet (20 mg total) by mouth daily.   GNP CRANBERRY EXTRACT PO Take 1 capsule by mouth in the morning.   loratadine (CLARITIN) 10 MG tablet Take 10 mg by mouth daily.   metoprolol succinate (TOPROL-XL) 25 MG 24 hr tablet Take 1 tablet (25 mg total) by mouth daily.   ONE TOUCH ULTRA TEST test strip USE UP TO FOUR TIMES DAILY AS DIRECTED (Patient taking differently: 1 each by Other route 4 (four) times daily.)   oxybutynin (DITROPAN-XL) 10 MG 24 hr tablet Take 1 tablet (10 mg total) by mouth at bedtime.   pantoprazole (PROTONIX) 40 MG tablet Take 1 tablet (40 mg total) by mouth 2 (two) times daily before a meal.   SYSTANE HYDRATION PF 0.4-0.3 % SOLN Place 1 drop into both eyes 3 (three) times daily as needed (for dryness).   vitamin B-12 1000 MCG tablet Take 1 tablet (1,000 mcg total) by mouth daily.   No facility-administered encounter medications on file as of 05/27/2021.    Patient Active Problem List   Diagnosis Date Noted   Symptomatic anemia 05/01/2021   Nonrheumatic tricuspid valve regurgitation 02/24/2021  Acute pulmonary edema (HCC)    Acute combined systolic and diastolic heart failure (HCC)    Nonrheumatic mitral valve regurgitation    Severe systolic congestive heart failure ([place]) 02/07/2021   Rapid atrial fibrillation ([place]) 02/07/2021   Atrial fibrillation with RVR ([place]) 02/04/2021   Educated about COVID-19 virus infection 02/02/2020   Iron deficiency anemia due to chronic blood loss    Occult blood in stools    Unprovoked DVT and Unprovoked Pulmonary Embolism -Dxed 03/2019 04/03/2019    Sciatica of right side 10/18/2018   Seborrheic keratosis 10/17/2016   Hyperlipidemia associated with type 2 diabetes mellitus ([place]) 10/17/2016   Overactive bladder 10/17/2016   Gastroesophageal reflux disease without esophagitis 10/17/2016   Primary osteoarthritis involving multiple joints 10/17/2016   Chronic gout without tophus 10/17/2016   Type 2 diabetes mellitus without complication, without long-term current use of insulin ([place]) 05/16/2016   Essential hypertension 05/16/2016   Hypothyroidism 05/16/2016   Anemia 10/30/2013   Morbid obesity ([place]) 10/30/2013   S/P right THA, AA 10/29/2013    Conditions to be addressed/monitored:Atrial Fibrillation, HTN, and DMII  Care Plan : [facility] Care Plan  Updates made by Ilean China, RN since 05/27/2021 12:00 AM     Problem: Chronic Disease Management Needs (DM, HTN, HLD, Afib, chronic anticoagulation)   Priority: Medium     Long-Range Goal: Work with [place] Coordination Regarding Chronic Medical Conditions   Start Date: 02/02/2021  This Visit's Progress: On track  Recent Progress: On track  Priority: High  Note:   Current Barriers:  Chronic Disease Management support and education needs related to Atrial Fibrillation, HTN, HLD, and DMII  RNCM Clinical Goal(s):  Patient will work with RN Case Manager to address needs related to Atrial Fibrillation, HTN, HLD, and DMII and recent hospitalization for septic shock secondary to kidney stone and UTI demonstrate ongoing self health care management ability through collaboration with RN Care manager, provider, and care team.   Interventions: 1:1 collaboration with Loman Brooklyn, FNP regarding development and update of comprehensive plan of care as evidenced by provider attestation and co-signature  Inter-disciplinary care team collaboration (see longitudinal plan of care) Reviewed chart including relevant office notes, lab results, and consultation reports Provided  with [place] contact number and encouraged to reach out as needed   Diabetes:  (Status: Condition stable. Not addressed this visit.) Lab Results  Component Value Date   HGBA1C 5.1 05/02/2021   HGBA1C 5.7 (H) 12/25/2020   HGBA1C 5.7 10/20/2020   Lab Results  Component Value Date   LDLCALC 66 02/07/2021   CREATININE 1.70 (H) 05/10/2021  Assessed patient's understanding of A1c goal: <7% Reviewed and discussed most recent A1C Reviewed medications with patient and discussed importance of medication adherence; Counseled on importance of regular laboratory monitoring as prescribed; Discussed plans with patient for ongoing care management follow up and provided patient with direct contact information for care management team; Reviewed scheduled/upcoming provider appointments including:  ; Encouraged to continue to follow an ADA/carb modified diet   Hypertension: (Status: Condition stable. Not addressed this visit.) Last practice recorded BP readings:  BP Readings from Last 3 Encounters:  05/10/21 130/74  05/04/21 (!) 176/83  04/30/21 136/62   Evaluation of current treatment plan related to hypertension self management and patient's adherence to plan as established by provider; Reviewed medications with patient and discussed importance of compliance; Discussed plans with patient for ongoing care management follow up and provided patient with direct contact  information for care management team; Advised patient, providing education and rationale, to monitor blood pressure daily and record, calling PCP for findings outside established parameters;  Reviewed scheduled/upcoming provider appointments including:    Reviewed and discussed recent office visit with cardiologist  Questioned mobility and physical activity level. Both are improving. Physical and occupational therapy have ended. Continues to use a walker from time to time but is mostly ambulating on her own without difficulty.  She is able to perform ADLs independently.  Discussed oxygen use. Patient is not using oxygen and is waiting for supplier to pick it up. O2 levels have been normal during the day and night.    HDL:  (Status: Condition stable. Not addressed this visit.) Lab Results  Component Value Date   CHOL 108 02/07/2021   HDL 29 (L) 02/07/2021   LDLCALC 66 02/07/2021   TRIG 65 02/07/2021   CHOLHDL 3.7 02/07/2021   Evaluation of current treatment plan related to HLD self-management and patient's adherence to plan as established by provider. Discussed plans with patient for ongoing care management follow up and provided patient with direct contact information for care management team Reviewed and discussed recent lab results Reviewed and discussed medications and compliance Encouraged patient to continue increasing physical activity level as tolerated   A-fib:  (Status: Goal on track: YES.) Afib action plan reviewed Assessed social determinant of health barriers Discussed prescription assistance approval for Eliquis. Approved until 07/24/21. Received shipment. Reviewed and discussed recent appointment with Jory Sims, NP (cardiology) on 04/30/21 to discuss results of echo.  Reviewed order for transeophogeal echo. That has been cancelled for now due to need for endoscopy to evaluate anemia. Discussed symptoms of Afib. Denies any feeling of palpitations or fluttering. Discussed use of metoprolol succinate 62m daily to control heart rate. Patient hasn't experienced any episodes of tachycardia but concerned about bradycardia. She does check her pulse ox and heart rate multiple times a day.  Encouraged to continue checking and recording hear rate daily and PRN and to call Cardio with any readings outside of recommended range Discussed home readings. HR averaging in the 50s now. Has not had any episodes in the 30s since we last discussed this Advised patient to continue to monitor heart rate and  symptoms and to reach out to cardio or seek emergency medical attention if needed [facility]   Anemia/Bleeding:  (Status: Goal on Track (progressing): YES.) Assessment of understanding of anemia/bleeding disorder diagnosis Basic overview and discussion of anemia/bleeding disorder or acute disease state Review of most recent labs:  CBC Latest Ref Rng & Units 05/10/2021 05/04/2021 05/03/2021  WBC 3.4 - 10.8 x10E3/uL 5.5 5.6 4.7  Hemoglobin 11.1 - 15.9 g/dL 9.6(L) 7.7(L) 7.4(L)  Hematocrit 34.0 - 46.6 % 30.6(L) 25.6(L) 23.6(L)  Platelets 150 - 450 x10E3/uL 260 236 199  Medications reviewed Restarted Eliquis after endoscopy as directed Still taking iron Reviewed and discussed recent endoscopy with Dr MWatt Climes Patient had ablation in several areas. Discussed signs of bleeding Stool is no longer dark since having ablation Advised to call GI if she develops any dark, tarry stools Discussed that hemoglobin should improve since the areas have been cauterized Discussed that PCP put in standing order for CBC so that patient can have repeated lab follow-ups to monitor hemoglobin trend Reviewed upcoming appointments   Acute Eyelid Problem:  (Status: Goal Met.) Talked with patient by telephone regarding bilateral eyelid irritation, itching, and swelling. First noticed it 2 days ago. Has  not used any different medications, eye drops, facial products, etc. Has used systane eye drops as usual. Advised that some eye drops have preservative or added ingredients that she may be allergic to.  Does not have any eyeball symptoms. Eyelid symptoms are improving at this point Discussed possibly of blepharitis. Denies any dryness or flaking.  Encouraged to continue daily Claritin. Can take a benadryl at night if needed. Advised that it may make her drowsy. Clean with gentle cleanser and pat dry. Can put a thin layer of Vaseline on eyelid to see if that will help with  irritation Advised to see a provider if symptoms get worse or don't improve  Patient Goals/Self-Care Activities: Patient will self administer medications as prescribed Patient will attend all scheduled provider appointments Patient will continue to perform ADL's independently Patient will call provider office for new concerns or questions Patient will take medication as directed. Call PCP or seek appropriate medical attention as needed for new or worsening symptoms Patient will seek appropriate medical attention for any s/s of bleeding or anemia Patient will monitor heart rate and call cardiologist with any readings outside of recommended range Patient will check and record blood pressure daily and call PCP or cardio with any readings outside of recommended range Patient will check and record blood sugar daily and call PCP with any readings outside of recommended range Patient will have repeated CBCs at PCP office to follow hemoglobin trend Patient will call RN Care Manager as needed [phone]  Plan:Telephone follow up appointment with care management team member scheduled for:  07/07/2021 with RNCM The patient has been provided with contact information for the care management team and has been advised to call with any health related questions or concerns.   Chong Sicilian, BSN, RN-BC Embedded Chronic Care Manager [facility] [place] Management Direct Dial: [phone]

## 2021-05-28 NOTE — Progress Notes (Deleted)
Cardiology Office Note   Date:  05/28/2021   ID:  Christasia, Angeletti [birth year], MRN [id number]  PCP:  Loman Brooklyn, FNP  Cardiologist:  Dr. Percival Spanish  No chief complaint on file.    History of Present Illness: [name] [name] is a 80 y.o. female who presents for  ongoing assessment and management of chronic systolic CHF, history of persistent atrial fibrillation with CHADS  VASC Score of 6 on Eliquis, hypertension, and DVT/PE.  She has other history which includes type 2 diabetes, obesity, GERD, hypothyroidism, and chronic anemia  She did have a repeat echocardiogram which was completed on 04/01/2021.  She was found to have a normal ejection fraction of 55 to 60% with normal LV function.  It was however found that she had severe mitral valve regurgitation with no evidence of mitral valve stenosis, moderate mitral annular calcification.  PI SA ER O0 0.44 cm, MR radius 1.1 cm and MR regurgitant volume of 92 cc.  She was recommended for TEE for further assessment of mitral regurgitation.    On last office visit, 04/30/2021, she had complaints of dizziness on occasion, and pain between her shoulder blades which she described as gnawing or burrowing.   She was scheduled for a TEE for further evaluation of severe mitral valve regurgitation. Her lasix was also decreased to 20 mg daily from 4o mg daily due to episodes of hypotension and dizziness. Labs were ordered to include CBC.   CBC revealed significant anemia with Hgb of 7.0 and TEE was cancelled.  She was advised to go to ED for evaluation and blood transfusion. She was admitted on 05/01/2021-10/111/2022.  She was given 1 unit of blood transfusion seen by GI with a capsule study.which was positive for bleeding. She is followed by Dr.Magog. Received ablation of gastric antral ectasia. 05/18/2021;   Past Medical History:  Diagnosis Date   Anemia    Arthritis    Ostearthritis- hips, knees, fingers   Diabetes mellitus without  complication (HCC)    DVT (deep venous thrombosis) (HCC)    Dyspnea    GERD (gastroesophageal reflux disease)    Headache(784.0)    tx. Valproic acid   History of kidney stones    Hypertension    Hypothyroidism    Pulmonary embolism [facility])     Past Surgical History:  Procedure Laterality Date   ABDOMINAL HYSTERECTOMY     BILATERAL HIP ARTHROSCOPY Left    BIOPSY  12/08/2019   Procedure: BIOPSY;  Surgeon: Daneil Dolin, MD;  [facility];  Service: Endoscopy;;   CATARACT EXTRACTION, BILATERAL     COLONOSCOPY N/A 12/08/2019   polyps (tubular adenoma), diverticulosis, colonic lipoma, no surveillance due to age   [address] W/ [place] Right 12/25/2020   Procedure: CYSTOSCOPY WITH RETROGRADE PYELOGRAM/URETERAL STENT PLACEMENT;  Surgeon: Janith Lima, MD;  [facility];  Service: Urology;  Laterality: Right;   CYSTOSCOPY/URETEROSCOPY/HOLMIUM LASER/STENT PLACEMENT Right 01/15/2021   Procedure: CYSTOSCOPY/RETROGRADE/URETEROSCOPY/HOLMIUM LASER/STENT EXCHANGE;  Surgeon: Janith Lima, MD;  [facility];  Service: Urology;  Laterality: Right;   ESOPHAGOGASTRODUODENOSCOPY N/A 12/08/2019   normal esophagus with 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   ESOPHAGOGASTRODUODENOSCOPY (EGD) WITH PROPOFOL N/A 05/18/2021   Procedure: ESOPHAGOGASTRODUODENOSCOPY (EGD) WITH PROPOFOL;  Surgeon: Clarene Essex, MD;  [facility];  Service: Endoscopy;  Laterality: N/A;  RFA; APC; ultraslim scope   GIVENS CAPSULE STUDY N/A 01/13/2020   Procedure: GIVENS CAPSULE STUDY;  Surgeon: Daneil Dolin,  MD;  [facility];  Service: Endoscopy;  Laterality: N/A;  7:30am   GIVENS CAPSULE STUDY N/A 05/04/2021   Procedure: GIVENS CAPSULE STUDY;  Surgeon: Clarene Essex, MD;  [facility];  Service: Endoscopy;  Laterality: N/A;   MULTIPLE TOOTH EXTRACTIONS     60's   PARATHYROIDECTOMY     POLYPECTOMY  12/08/2019   Procedure:  POLYPECTOMY;  Surgeon: Daneil Dolin, MD;  [facility];  Service: Endoscopy;;   RADIOFREQUENCY ABLATION  05/18/2021   Procedure: RADIO FREQUENCY ABLATION;  Surgeon: Clarene Essex, MD;  [facility];  Service: Endoscopy;;   TOTAL HIP ARTHROPLASTY Right 10/29/2013   Procedure: RIGHT TOTAL HIP ARTHROPLASTY ANTERIOR APPROACH;  Surgeon: Mauri Pole, MD;  [facility];  Service: Orthopedics;  Laterality: Right;     Current Outpatient Medications  Medication Sig Dispense Refill   acetaminophen (TYLENOL) 325 MG tablet Take 2 tablets (650 mg total) by mouth every 6 (six) hours as needed for mild pain or headache (or Fever >/= 101). 12 tablet 0   allopurinol (ZYLOPRIM) 100 MG tablet Take 1 tablet (100 mg total) by mouth daily. 90 tablet 1   amLODipine (NORVASC) 2.5 MG tablet Take 1 tablet (2.5 mg total) by mouth daily. 30 tablet 2   apixaban (ELIQUIS) 5 MG TABS tablet Take 1 tablet (5 mg total) by mouth 2 (two) times daily. 90 tablet 3   atorvastatin (LIPITOR) 20 MG tablet Take 1 tablet (20 mg total) by mouth daily. 90 tablet 1   Cholecalciferol (VITAMIN D-3) 125 MCG (5000 UT) TABS Take 5,000 Units by mouth daily with breakfast.     diclofenac Sodium (VOLTAREN) 1 % GEL APPLY 4 GRAMS TOPICALLY 4  TIMES DAILY (Patient taking differently: Apply 2-4 g topically 4 (four) times daily as needed (for bilateral knee pain).) 300 g 0   EUTHYROX 50 MCG tablet TAKE 1 TABLET BY MOUTH ONCE DAILY BEFORE BREAKFAST (Patient taking differently: Take 50 mcg by mouth daily before breakfast.) 30 tablet 10   ferrous sulfate 325 (65 FE) MG EC tablet Take 1 tablet (325 mg total) by mouth 2 (two) times daily. 60 tablet 2   furosemide (LASIX) 20 MG tablet Take 1 tablet (20 mg total) by mouth daily. 90 tablet 2   GNP CRANBERRY EXTRACT PO Take 1 capsule by mouth in the morning.     loratadine (CLARITIN) 10 MG tablet Take 10 mg by mouth daily.     metoprolol succinate (TOPROL-XL) 25 MG 24 hr tablet Take 1  tablet (25 mg total) by mouth daily. 90 tablet 1   ONE TOUCH ULTRA TEST test strip USE UP TO FOUR TIMES DAILY AS DIRECTED (Patient taking differently: 1 each by Other route 4 (four) times daily.) 400 each 3   oxybutynin (DITROPAN-XL) 10 MG 24 hr tablet Take 1 tablet (10 mg total) by mouth at bedtime. 90 tablet 1   pantoprazole (PROTONIX) 40 MG tablet Take 1 tablet (40 mg total) by mouth 2 (two) times daily before a meal. 180 tablet 1   SYSTANE HYDRATION PF 0.4-0.3 % SOLN Place 1 drop into both eyes 3 (three) times daily as needed (for dryness).     vitamin B-12 1000 MCG tablet Take 1 tablet (1,000 mcg total) by mouth daily. 30 tablet 2   No current facility-administered medications for this visit.    Allergies:   Patient has no known allergies.    Social History:  The patient  reports that she has never  smoked. She has never used smokeless tobacco. She reports that she does not drink alcohol and does not use drugs.   Family History:  The patient's family history includes Cancer (age of onset: 58) in her sister; Cancer (age of onset: 31) in her mother; Colitis (age of onset: 43) in her sister; Healthy in her daughter, son, son, and son; Heart attack (age of onset: 48) in her brother; Heart disease in her brother; Heart disease (age of onset: 15) in her father; Pulmonary embolism (age of onset: 28) in her brother.    ROS: All other systems are reviewed and negative. Unless otherwise mentioned in H&P    PHYSICAL EXAM: VS:  There were no vitals taken for this visit. , BMI There is no height or weight on file to calculate BMI. GEN: Well nourished, well developed, in no acute distress HEENT: normal Neck: no JVD, carotid bruits, or masses Cardiac: ***RRR; no murmurs, rubs, or gallops,no edema  Respiratory:  Clear to auscultation bilaterally, normal work of breathing GI: soft, nontender, nondistended, + BS MS: no deformity or atrophy Skin: warm and dry, no rash Neuro:  Strength and sensation  are intact Psych: euthymic mood, full affect   EKG:  EKG {ACTION; IS/IS CVE:[id number]} ordered today. The ekg ordered today demonstrates ***   Recent Labs: 02/07/2021: TSH 3.605 02/09/2021: Magnesium 1.8 05/02/2021: ALT 11 05/10/2021: BUN 21; Creatinine, Ser 1.70; Hemoglobin 9.6; Platelets 260; Potassium 4.3; Sodium 145    Lipid Panel    Component Value Date/Time   CHOL 108 02/07/2021 0528   CHOL 174 10/20/2020 1017   TRIG 65 02/07/2021 0528   HDL 29 (L) 02/07/2021 0528   HDL 55 10/20/2020 1017   CHOLHDL 3.7 02/07/2021 0528   VLDL 13 02/07/2021 0528   LDLCALC 66 02/07/2021 0528   LDLCALC 90 10/20/2020 1017      Wt Readings from Last 3 Encounters:  05/18/21 193 lb (87.5 kg)  05/10/21 194 lb 3.2 oz (88.1 kg)  05/04/21 198 lb 13.7 oz (90.2 kg)      Other studies Reviewed: Echocardiogram 04-28-2021 1. Left ventricular ejection fraction, by estimation, is 55 to 60%. The  left ventricle has normal function. The left ventricle has no regional  wall motion abnormalities. The left ventricular internal cavity size was  mildly dilated. There is mild  asymmetric left ventricular hypertrophy of the septal segment. Left  ventricular diastolic parameters are consistent with Grade III diastolic  dysfunction (restrictive). Elevated left ventricular end-diastolic  pressure.   2. Right ventricular systolic function is normal. The right ventricular  size is mildly enlarged. There is mildly elevated pulmonary artery  systolic pressure. The estimated right ventricular systolic pressure is  10.2 mmHg.   3. Left atrial size was severely dilated.   4. Right atrial size was moderately dilated.   5. The mitral valve is abnormal. Severe mitral valve regurgitation. No  evidence of mitral stenosis. Moderate mitral annular calcification. PISA  ERO 0.44cm2, MR radius 1.1cm and MR regurgitant volume 92cc.   6. The aortic valve is normal in structure. Aortic valve regurgitation is  not visualized.  No aortic stenosis is present.   7. Aortic dilatation noted. There is mild dilatation of the aortic root,  measuring 41 mm.   8. The inferior vena cava is normal in size with greater than 50%  respiratory variability, suggesting right atrial pressure of 3 mmHg.   9. Recommend TEE for further assessement of mitral regurgitation.  ASSESSMENT AND PLAN:  1.  ***  Current medicines are reviewed at length with the patient today.  I have spent *** dedicated to the care of this patient on the date of this encounter to include pre-visit review of records, assessment, management and diagnostic testing,with shared decision making.  Labs/ tests ordered today include: *** Phill Myron. [place] Pugh, ANP, AACC   05/28/2021 1:13 PM    [facility] [address] Office [phone] Fax [phone]  Notice: This dictation was prepared with Dragon dictation along with smaller phrase technology. Any transcriptional errors that result from this process are unintentional and may not be corrected upon review.

## 2021-06-04 ENCOUNTER — Ambulatory Visit: Payer: Medicare Other | Admitting: Adult Health

## 2021-06-10 NOTE — H&P (View-Only) (Signed)
Cardiology Office Note   Date:  06/11/2021   ID:  [name] [name], [name] [name] 1940-12-06, MRN [id number]  PCP:  Loman Brooklyn, FNP  Cardiologist:   Minus Breeding, MD   Chief Complaint  Patient presents with   Mitral Regurgitation       History of Present Illness: [name] [name] is a 80 y.o. female who presents for management of chronic systolic CHF and persistent atrial fibrillation with CHADS  VASC Score of 6.   Echocardiogram on 02/07/21 revealed 35% to 40%, moderate elevated PASP with estimated RVSP 51.5 mmHg, severely dilated LA, moderate dilated RA, severe MR moderate TR, mild aortic sclerosis.  She did have a repeat echocardiogram which was completed on 04/01/2021.  She was found to have a normal ejection fraction of 55 to 60% with normal LV function.  It was however found that she had severe mitral valve regurgitation with no evidence of mitral valve stenosis, moderate mitral annular calcification.  PI SA ER O0 0.44 cm, MR radius 1.1 cm and MR regurgitant volume of 92 cc.  She was recommended for TEE for further assessment of mitral regurgitation.   She did not have a TEE however because she was found to be very anemic.  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  Her hemoglobin is now in the mid nines.  She actually feels better.  She says she feels relatively well.  She gets around with a rolling walker and she says she is not having any acute shortness of breath.  She feels an occasional palpitations but she is had no presyncope or syncope.  She is not having any chest pressure, neck or arm discomfort.  She is having no weight gain or edema.    Past Medical History:  Diagnosis Date   Anemia    Arthritis    Ostearthritis- hips, knees, fingers   Diabetes mellitus without complication (HCC)    DVT (deep venous thrombosis) (HCC)    Dyspnea    GERD (gastroesophageal reflux disease)    Headache(784.0)    tx. Valproic  acid   History of kidney stones    Hypertension    Hypothyroidism    Pulmonary embolism [facility])     Past Surgical History:  Procedure Laterality Date   ABDOMINAL HYSTERECTOMY     BILATERAL HIP ARTHROSCOPY Left    BIOPSY  12/08/2019   Procedure: BIOPSY;  Surgeon: Daneil Dolin, MD;  [facility];  Service: Endoscopy;;   CATARACT EXTRACTION, BILATERAL     COLONOSCOPY N/A 12/08/2019   polyps (tubular adenoma), diverticulosis, colonic lipoma, no surveillance due to age   72 [address]/ [place] Right 12/25/2020   Procedure: CYSTOSCOPY WITH RETROGRADE PYELOGRAM/URETERAL STENT PLACEMENT;  Surgeon: Janith Lima, MD;  [facility];  Service: Urology;  Laterality: Right;   CYSTOSCOPY/URETEROSCOPY/HOLMIUM LASER/STENT PLACEMENT Right 01/15/2021   Procedure: CYSTOSCOPY/RETROGRADE/URETEROSCOPY/HOLMIUM LASER/STENT EXCHANGE;  Surgeon: Janith Lima, MD;  [facility];  Service: Urology;  Laterality: Right;   ESOPHAGOGASTRODUODENOSCOPY N/A 12/08/2019   normal esophagus with 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   ESOPHAGOGASTRODUODENOSCOPY (EGD) WITH PROPOFOL N/A 05/18/2021   Procedure: ESOPHAGOGASTRODUODENOSCOPY (EGD) WITH PROPOFOL;  Surgeon: Clarene Essex, MD;  [facility];  Service: Endoscopy;  Laterality: N/A;  RFA; APC; ultraslim scope   GIVENS CAPSULE STUDY N/A 01/13/2020   Procedure: GIVENS CAPSULE STUDY;  Surgeon: Daneil Dolin, MD;  [facility];  Service: Endoscopy;  Laterality: N/A;  7:30am   GIVENS CAPSULE STUDY N/A 05/04/2021   Procedure: GIVENS CAPSULE STUDY;  Surgeon: Clarene Essex, MD;  [facility];  Service: Endoscopy;  Laterality: N/A;   MULTIPLE TOOTH EXTRACTIONS     60's   PARATHYROIDECTOMY     POLYPECTOMY  12/08/2019   Procedure: POLYPECTOMY;  Surgeon: Daneil Dolin, MD;  [facility];  Service: Endoscopy;;   RADIOFREQUENCY ABLATION  05/18/2021   Procedure: RADIO  FREQUENCY ABLATION;  Surgeon: Clarene Essex, MD;  [facility];  Service: Endoscopy;;   TOTAL HIP ARTHROPLASTY Right 10/29/2013   Procedure: RIGHT TOTAL HIP ARTHROPLASTY ANTERIOR APPROACH;  Surgeon: Mauri Pole, MD;  [facility];  Service: Orthopedics;  Laterality: Right;     Current Outpatient Medications  Medication Sig Dispense Refill   acetaminophen (TYLENOL) 325 MG tablet Take 2 tablets (650 mg total) by mouth every 6 (six) hours as needed for mild pain or headache (or Fever >/= 101). 12 tablet 0   allopurinol (ZYLOPRIM) 100 MG tablet Take 1 tablet (100 mg total) by mouth daily. 90 tablet 1   amLODipine (NORVASC) 5 MG tablet Take 1 tablet (5 mg total) by mouth daily. 90 tablet 3   apixaban (ELIQUIS) 5 MG TABS tablet Take 1 tablet (5 mg total) by mouth 2 (two) times daily. 90 tablet 3   atorvastatin (LIPITOR) 20 MG tablet Take 1 tablet (20 mg total) by mouth daily. 90 tablet 1   Cholecalciferol (VITAMIN D-3) 125 MCG (5000 UT) TABS Take 5,000 Units by mouth daily with breakfast.     diclofenac Sodium (VOLTAREN) 1 % GEL APPLY 4 GRAMS TOPICALLY 4  TIMES DAILY (Patient taking differently: Apply 2-4 g topically 4 (four) times daily as needed (for bilateral knee pain).) 300 g 0   EUTHYROX 50 MCG tablet TAKE 1 TABLET BY MOUTH ONCE DAILY BEFORE BREAKFAST (Patient taking differently: Take 50 mcg by mouth daily before breakfast.) 30 tablet 10   ferrous sulfate 325 (65 FE) MG EC tablet Take 1 tablet (325 mg total) by mouth 2 (two) times daily. 60 tablet 2   furosemide (LASIX) 20 MG tablet Take 1 tablet (20 mg total) by mouth daily. 90 tablet 2   GNP CRANBERRY EXTRACT PO Take 1 capsule by mouth in the morning.     loratadine (CLARITIN) 10 MG tablet Take 10 mg by mouth daily.     metoprolol succinate (TOPROL-XL) 25 MG 24 hr tablet Take 1 tablet (25 mg total) by mouth daily. 90 tablet 1   ONE TOUCH ULTRA TEST test strip USE UP TO FOUR TIMES DAILY AS DIRECTED (Patient taking differently: 1  each by Other route 4 (four) times daily.) 400 each 3   oxybutynin (DITROPAN-XL) 10 MG 24 hr tablet Take 1 tablet (10 mg total) by mouth at bedtime. 90 tablet 1   pantoprazole (PROTONIX) 40 MG tablet Take 1 tablet (40 mg total) by mouth 2 (two) times daily before a meal. 180 tablet 1   SYSTANE HYDRATION PF 0.4-0.3 % SOLN Place 1 drop into both eyes 3 (three) times daily as needed (for dryness).     vitamin B-12 1000 MCG tablet Take 1 tablet (1,000 mcg total) by mouth daily. 30 tablet 2   No current facility-administered medications for this visit.    Allergies:   Patient has no known allergies.    ROS:  Please see the history of present illness.   Otherwise, review of systems are  positive for none.   All other systems are reviewed and negative.    PHYSICAL EXAM: VS:  BP (!) 148/80   Pulse (!) 44   Ht 5\' 3"  (1.6 m)   Wt 196 lb (88.9 kg)   SpO2 99%   BMI 34.72 kg/m  , BMI Body mass index is 34.72 kg/m. GEN:  No distress NECK:  No jugular venous distention at 90 degrees, waveform within normal limits, carotid upstroke brisk and symmetric, no bruits, no thyromegaly LYMPHATICS:  No cervical adenopathy LUNGS:  Clear to auscultation bilaterally BACK:  No CVA tenderness CHEST:  Unremarkable HEART:  S1 and S2 within normal limits, no S3, no S4, no clicks, no rubs, very soft axillary murmur, no diastolic  murmurs ABD:  Positive bowel sounds normal in frequency in pitch, no bruits, no rebound, no guarding, unable to assess midline mass or bruit with the patient seated. EXT:  2 plus pulses throughout, trace  edema, no cyanosis no clubbing SKIN:  No rashes no nodules NEURO:  Cranial nerves II through XII grossly intact, motor grossly intact throughout PSYCH:  Cognitively intact, oriented to person place and time   EKG:  EKG is ordered today. The ekg ordered today demonstrates sinus bradycardia, rate 48, premature atrial contractions, short PR interval, no acute ST-T wave  changes.   Recent Labs: 02/07/2021: TSH 3.605 02/09/2021: Magnesium 1.8 05/02/2021: ALT 11 05/10/2021: BUN 21; Creatinine, Ser 1.70; Hemoglobin 9.6; Platelets 260; Potassium 4.3; Sodium 145    Lipid Panel    Component Value Date/Time   CHOL 108 02/07/2021 0528   CHOL 174 10/20/2020 1017   TRIG 65 02/07/2021 0528   HDL 29 (L) 02/07/2021 0528   HDL 55 10/20/2020 1017   CHOLHDL 3.7 02/07/2021 0528   VLDL 13 02/07/2021 0528   LDLCALC 66 02/07/2021 0528   LDLCALC 90 10/20/2020 1017      Wt Readings from Last 3 Encounters:  06/11/21 196 lb (88.9 kg)  05/18/21 193 lb (87.5 kg)  05/10/21 194 lb 3.2 oz (88.1 kg)      Other studies Reviewed: Additional studies/ records that were reviewed today include: GI and hospital recrods. Review of the above records demonstrates:  Please see elsewhere in the note.     ASSESSMENT AND PLAN:  Severe mitral valve regurgitation with moderate mitral annular calcification:    Now that her hemoglobin is improved and the bleeding is resolved and I will be scheduling her for a TEE.    Dizziness with episodes of hypotension: She is not bothered by this any longer.  No change in therapy.  History of atrial fibrillation with RVR: She seems to be maintaining sinus rhythm with some ectopy.  She tolerates anticoagulation.  She is back on this.   History of systolic heart failure: Her ejection fraction had improved.  She seems to be euvolemic.  No change in therapy.  HTN:  I will increase the amlodipine to 5 mg.  I do not think she will tolerate higher dose beta-blockers and the creatinine was not allow for ARB or ARNI.  Current medicines are reviewed at length with the patient today.  The patient does not have concerns regarding medicines.  The following changes have been made:  as above  Labs/ tests ordered today include:   Orders Placed This Encounter  Procedures   Basic metabolic panel   CBC w/Diff/Platelet   EKG 12-Lead      Disposition:    FU with me after the TEE.  Signed, Minus Breeding, MD  06/11/2021 12:50 PM    [facility]

## 2021-06-10 NOTE — Progress Notes (Signed)
Cardiology Office Note   Date:  06/11/2021   ID:  [name], [name] Dec 11, 1940, MRN [id number]  PCP:  Loman Brooklyn, FNP  Cardiologist:   Minus Breeding, MD   Chief Complaint  Patient presents with   Mitral Regurgitation       History of Present Illness: [name] [name] is a 80 y.o. female who presents for management of chronic systolic CHF and persistent atrial fibrillation with CHADS  VASC Score of 6.   Echocardiogram on 02/07/21 revealed 35% to 40%, moderate elevated PASP with estimated RVSP 51.5 mmHg, severely dilated LA, moderate dilated RA, severe MR moderate TR, mild aortic sclerosis.  She did have a repeat echocardiogram which was completed on 04/01/2021.  She was found to have a normal ejection fraction of 55 to 60% with normal LV function.  It was however found that she had severe mitral valve regurgitation with no evidence of mitral valve stenosis, moderate mitral annular calcification.  PI SA ER O0 0.44 cm, MR radius 1.1 cm and MR regurgitant volume of 92 cc.  She was recommended for TEE for further assessment of mitral regurgitation.   She did not have a TEE however because she was found to be very anemic.  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  Her hemoglobin is now in the mid nines.  She actually feels better.  She says she feels relatively well.  She gets around with a rolling walker and she says she is not having any acute shortness of breath.  She feels an occasional palpitations but she is had no presyncope or syncope.  She is not having any chest pressure, neck or arm discomfort.  She is having no weight gain or edema.    Past Medical History:  Diagnosis Date   Anemia    Arthritis    Ostearthritis- hips, knees, fingers   Diabetes mellitus without complication (HCC)    DVT (deep venous thrombosis) (HCC)    Dyspnea    GERD (gastroesophageal reflux disease)    Headache(784.0)    tx. Valproic  acid   History of kidney stones    Hypertension    Hypothyroidism    Pulmonary embolism [facility])     Past Surgical History:  Procedure Laterality Date   ABDOMINAL HYSTERECTOMY     BILATERAL HIP ARTHROSCOPY Left    BIOPSY  12/08/2019   Procedure: BIOPSY;  Surgeon: Daneil Dolin, MD;  [facility];  Service: Endoscopy;;   CATARACT EXTRACTION, BILATERAL     COLONOSCOPY N/A 12/08/2019   polyps (tubular adenoma), diverticulosis, colonic lipoma, no surveillance due to age   [address]/ [place] Right 12/25/2020   Procedure: CYSTOSCOPY WITH RETROGRADE PYELOGRAM/URETERAL STENT PLACEMENT;  Surgeon: Janith Lima, MD;  Location: [facility];  Service: Urology;  Laterality: Right;   CYSTOSCOPY/URETEROSCOPY/HOLMIUM LASER/STENT PLACEMENT Right 01/15/2021   Procedure: CYSTOSCOPY/RETROGRADE/URETEROSCOPY/HOLMIUM LASER/STENT EXCHANGE;  Surgeon: Janith Lima, MD;  Location: [facility];  Service: Urology;  Laterality: Right;   ESOPHAGOGASTRODUODENOSCOPY N/A 12/08/2019   normal esophagus with 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   ESOPHAGOGASTRODUODENOSCOPY (EGD) WITH PROPOFOL N/A 05/18/2021   Procedure: ESOPHAGOGASTRODUODENOSCOPY (EGD) WITH PROPOFOL;  Surgeon: Clarene Essex, MD;  [facility];  Service: Endoscopy;  Laterality: N/A;  RFA; APC; ultraslim scope   GIVENS CAPSULE STUDY N/A 01/13/2020   Procedure: GIVENS CAPSULE STUDY;  Surgeon: Daneil Dolin, MD;  [facility];  Service: Endoscopy;  Laterality: N/A;  7:30am   GIVENS CAPSULE STUDY N/A 05/04/2021   Procedure: GIVENS CAPSULE STUDY;  Surgeon: Clarene Essex, MD;  [facility];  Service: Endoscopy;  Laterality: N/A;   MULTIPLE TOOTH EXTRACTIONS     60's   PARATHYROIDECTOMY     POLYPECTOMY  12/08/2019   Procedure: POLYPECTOMY;  Surgeon: Daneil Dolin, MD;  [facility];  Service: Endoscopy;;   RADIOFREQUENCY ABLATION  05/18/2021   Procedure: RADIO  FREQUENCY ABLATION;  Surgeon: Clarene Essex, MD;  [facility];  Service: Endoscopy;;   TOTAL HIP ARTHROPLASTY Right 10/29/2013   Procedure: RIGHT TOTAL HIP ARTHROPLASTY ANTERIOR APPROACH;  Surgeon: Mauri Pole, MD;  Location: [facility];  Service: Orthopedics;  Laterality: Right;     Current Outpatient Medications  Medication Sig Dispense Refill   acetaminophen (TYLENOL) 325 MG tablet Take 2 tablets (650 mg total) by mouth every 6 (six) hours as needed for mild pain or headache (or Fever >/= 101). 12 tablet 0   allopurinol (ZYLOPRIM) 100 MG tablet Take 1 tablet (100 mg total) by mouth daily. 90 tablet 1   amLODipine (NORVASC) 5 MG tablet Take 1 tablet (5 mg total) by mouth daily. 90 tablet 3   apixaban (ELIQUIS) 5 MG TABS tablet Take 1 tablet (5 mg total) by mouth 2 (two) times daily. 90 tablet 3   atorvastatin (LIPITOR) 20 MG tablet Take 1 tablet (20 mg total) by mouth daily. 90 tablet 1   Cholecalciferol (VITAMIN D-3) 125 MCG (5000 UT) TABS Take 5,000 Units by mouth daily with breakfast.     diclofenac Sodium (VOLTAREN) 1 % GEL APPLY 4 GRAMS TOPICALLY 4  TIMES DAILY (Patient taking differently: Apply 2-4 g topically 4 (four) times daily as needed (for bilateral knee pain).) 300 g 0   EUTHYROX 50 MCG tablet TAKE 1 TABLET BY MOUTH ONCE DAILY BEFORE BREAKFAST (Patient taking differently: Take 50 mcg by mouth daily before breakfast.) 30 tablet 10   ferrous sulfate 325 (65 FE) MG EC tablet Take 1 tablet (325 mg total) by mouth 2 (two) times daily. 60 tablet 2   furosemide (LASIX) 20 MG tablet Take 1 tablet (20 mg total) by mouth daily. 90 tablet 2   GNP CRANBERRY EXTRACT PO Take 1 capsule by mouth in the morning.     loratadine (CLARITIN) 10 MG tablet Take 10 mg by mouth daily.     metoprolol succinate (TOPROL-XL) 25 MG 24 hr tablet Take 1 tablet (25 mg total) by mouth daily. 90 tablet 1   ONE TOUCH ULTRA TEST test strip USE UP TO FOUR TIMES DAILY AS DIRECTED (Patient taking differently: 1  each by Other route 4 (four) times daily.) 400 each 3   oxybutynin (DITROPAN-XL) 10 MG 24 hr tablet Take 1 tablet (10 mg total) by mouth at bedtime. 90 tablet 1   pantoprazole (PROTONIX) 40 MG tablet Take 1 tablet (40 mg total) by mouth 2 (two) times daily before a meal. 180 tablet 1   SYSTANE HYDRATION PF 0.4-0.3 % SOLN Place 1 drop into both eyes 3 (three) times daily as needed (for dryness).     vitamin B-12 1000 MCG tablet Take 1 tablet (1,000 mcg total) by mouth daily. 30 tablet 2   No current facility-administered medications for this visit.    Allergies:   Patient has no known allergies.    ROS:  Please see the history of present illness.   Otherwise, review of systems are  positive for none.   All other systems are reviewed and negative.    PHYSICAL EXAM: VS:  BP (!) 148/80   Pulse (!) 44   Ht 5\' 3"  (1.6 m)   Wt 196 lb (88.9 kg)   SpO2 99%   BMI 34.72 kg/m  , BMI Body mass index is 34.72 kg/m. GEN:  No distress NECK:  No jugular venous distention at 90 degrees, waveform within normal limits, carotid upstroke brisk and symmetric, no bruits, no thyromegaly LYMPHATICS:  No cervical adenopathy LUNGS:  Clear to auscultation bilaterally BACK:  No CVA tenderness CHEST:  Unremarkable HEART:  S1 and S2 within normal limits, no S3, no S4, no clicks, no rubs, very soft axillary murmur, no diastolic  murmurs ABD:  Positive bowel sounds normal in frequency in pitch, no bruits, no rebound, no guarding, unable to assess midline mass or bruit with the patient seated. EXT:  2 plus pulses throughout, trace  edema, no cyanosis no clubbing SKIN:  No rashes no nodules NEURO:  Cranial nerves II through XII grossly intact, motor grossly intact throughout PSYCH:  Cognitively intact, oriented to person place and time   EKG:  EKG is ordered today. The ekg ordered today demonstrates sinus bradycardia, rate 48, premature atrial contractions, short PR interval, no acute ST-T wave  changes.   Recent Labs: 02/07/2021: TSH 3.605 02/09/2021: Magnesium 1.8 05/02/2021: ALT 11 05/10/2021: BUN 21; Creatinine, Ser 1.70; Hemoglobin 9.6; Platelets 260; Potassium 4.3; Sodium 145    Lipid Panel    Component Value Date/Time   CHOL 108 02/07/2021 0528   CHOL 174 10/20/2020 1017   TRIG 65 02/07/2021 0528   HDL 29 (L) 02/07/2021 0528   HDL 55 10/20/2020 1017   CHOLHDL 3.7 02/07/2021 0528   VLDL 13 02/07/2021 0528   LDLCALC 66 02/07/2021 0528   LDLCALC 90 10/20/2020 1017      Wt Readings from Last 3 Encounters:  06/11/21 196 lb (88.9 kg)  05/18/21 193 lb (87.5 kg)  05/10/21 194 lb 3.2 oz (88.1 kg)      Other studies Reviewed: Additional studies/ records that were reviewed today include: GI and hospital recrods. Review of the above records demonstrates:  Please see elsewhere in the note.     ASSESSMENT AND PLAN:  Severe mitral valve regurgitation with moderate mitral annular calcification:    Now that her hemoglobin is improved and the bleeding is resolved and I will be scheduling her for a TEE.    Dizziness with episodes of hypotension: She is not bothered by this any longer.  No change in therapy.  History of atrial fibrillation with RVR: She seems to be maintaining sinus rhythm with some ectopy.  She tolerates anticoagulation.  She is back on this.   History of systolic heart failure: Her ejection fraction had improved.  She seems to be euvolemic.  No change in therapy.  HTN:  I will increase the amlodipine to 5 mg.  I do not think she will tolerate higher dose beta-blockers and the creatinine was not allow for ARB or ARNI.  Current medicines are reviewed at length with the patient today.  The patient does not have concerns regarding medicines.  The following changes have been made:  as above  Labs/ tests ordered today include:   Orders Placed This Encounter  Procedures   Basic metabolic panel   CBC w/Diff/Platelet   EKG 12-Lead      Disposition:    FU with me after the TEE.  Signed, Minus Breeding, MD  06/11/2021 12:50 PM    [facility]

## 2021-06-11 ENCOUNTER — Other Ambulatory Visit: Payer: Self-pay

## 2021-06-11 ENCOUNTER — Ambulatory Visit (INDEPENDENT_AMBULATORY_CARE_PROVIDER_SITE_OTHER): Payer: Medicare Other | Admitting: Cardiology

## 2021-06-11 ENCOUNTER — Encounter: Payer: Self-pay | Admitting: Cardiology

## 2021-06-11 VITALS — BP 148/80 | HR 44 | Ht 63.0 in | Wt 196.0 lb

## 2021-06-11 DIAGNOSIS — R42 Dizziness and giddiness: Secondary | ICD-10-CM

## 2021-06-11 DIAGNOSIS — I5020 Unspecified systolic (congestive) heart failure: Secondary | ICD-10-CM | POA: Diagnosis not present

## 2021-06-11 DIAGNOSIS — I4891 Unspecified atrial fibrillation: Secondary | ICD-10-CM

## 2021-06-11 DIAGNOSIS — I340 Nonrheumatic mitral (valve) insufficiency: Secondary | ICD-10-CM

## 2021-06-11 MED ORDER — AMLODIPINE BESYLATE 5 MG PO TABS: 5.0000 mg | ORAL_TABLET | Freq: Every day | ORAL | 3 refills | Status: DC

## 2021-06-11 NOTE — Patient Instructions (Addendum)
Medication Instructions:  Increase Amlodipine to 5 mg daily    Continue all other medications *If you need a refill on your cardiac medications before your next appointment, please call your pharmacy*   Lab Work: Bmet,cbc   Testing/Procedures: TEE scheduled at [facility] hospital Tuesday 07/06/21  at 8:00 am Arrive at 7:00 am    Follow instructions below   Follow-Up: At [facility], you and your health needs are our priority.  As part of our continuing mission to provide you with exceptional heart care, we have created designated Provider Care Teams.  These Care Teams include your primary Cardiologist (physician) and Advanced Practice Providers (APPs -  Physician Assistants and Nurse Practitioners) who all work together to provide you with the care you need, when you need it.  We recommend signing up for the patient portal called "MyChart".  Sign up information is provided on this After Visit Summary.  MyChart is used to connect with patients for Virtual Visits (Telemedicine).  Patients are able to view lab/test results, encounter notes, upcoming appointments, etc.  Non-urgent messages can be sent to your provider as well.   To learn more about what you can do with MyChart, go to [email or web address].     Your next appointment:      The format for your next appointment: Office   Provider:  Dr.Hochrein    You are scheduled for a TEE on Tuesday 07/06/21 with Dr. Sallyanne Kuster.  Please arrive at the [facility] (Main Entrance A) at [facility]: [address] [place], [place] [address] at 7:00 am.   DIET: Nothing to eat or drink after midnight except a sip of water with medications (see medication instructions below)  FYI: For your safety, and to allow [place] to monitor your vital signs accurately during the surgery/procedure we request that   if you have artificial nails, gel coating, SNS etc. Please have those removed prior to your surgery/procedure. Not having the nail coverings  /polish removed may result in cancellation or delay of your surgery/procedure.   Medication Instructions: Hold Furosemide morning of TEE  Continue your anticoagulant: Eliquis    Labs: bmet,cbc today  You must have a responsible person to drive you home and stay in the waiting area during your procedure. Failure to do so could result in cancellation.  Bring your insurance cards.  *Special Note: Every effort is made to have your procedure done on time. Occasionally there are emergencies that occur at the hospital that may cause delays. Please be patient if a delay does occur.

## 2021-06-14 ENCOUNTER — Other Ambulatory Visit: Payer: Medicare Other

## 2021-06-14 ENCOUNTER — Other Ambulatory Visit: Payer: Self-pay

## 2021-06-14 DIAGNOSIS — R42 Dizziness and giddiness: Secondary | ICD-10-CM | POA: Diagnosis not present

## 2021-06-14 DIAGNOSIS — I340 Nonrheumatic mitral (valve) insufficiency: Secondary | ICD-10-CM

## 2021-06-14 DIAGNOSIS — I4891 Unspecified atrial fibrillation: Secondary | ICD-10-CM | POA: Diagnosis not present

## 2021-06-14 DIAGNOSIS — I5020 Unspecified systolic (congestive) heart failure: Secondary | ICD-10-CM | POA: Diagnosis not present

## 2021-06-15 LAB — CBC WITH DIFFERENTIAL/PLATELET
Basophils Absolute: 0.1 10*3/uL (ref 0.0–0.2)
Basos: 1 %
EOS (ABSOLUTE): 0.2 10*3/uL (ref 0.0–0.4)
Eos: 3 %
Hematocrit: 36.2 % (ref 34.0–46.6)
Hemoglobin: 11.4 g/dL (ref 11.1–15.9)
Immature Grans (Abs): 0 10*3/uL (ref 0.0–0.1)
Immature Granulocytes: 0 %
Lymphocytes Absolute: 1.2 10*3/uL (ref 0.7–3.1)
Lymphs: 26 %
MCH: 30.5 pg (ref 26.6–33.0)
MCHC: 31.5 g/dL (ref 31.5–35.7)
MCV: 97 fL (ref 79–97)
Monocytes Absolute: 0.6 10*3/uL (ref 0.1–0.9)
Monocytes: 11 %
Neutrophils Absolute: 2.8 10*3/uL (ref 1.4–7.0)
Neutrophils: 59 %
Platelets: 241 10*3/uL (ref 150–450)
RBC: 3.74 x10E6/uL — ABNORMAL LOW (ref 3.77–5.28)
RDW: 14.6 % (ref 11.7–15.4)
WBC: 4.8 10*3/uL (ref 3.4–10.8)

## 2021-06-15 LAB — BASIC METABOLIC PANEL
BUN/Creatinine Ratio: 14 (ref 12–28)
BUN: 17 mg/dL (ref 8–27)
CO2: 25 mmol/L (ref 20–29)
Calcium: 11.4 mg/dL — ABNORMAL HIGH (ref 8.7–10.3)
Chloride: 105 mmol/L (ref 96–106)
Creatinine, Ser: 1.2 mg/dL — ABNORMAL HIGH (ref 0.57–1.00)
Glucose: 109 mg/dL — ABNORMAL HIGH (ref 70–99)
Potassium: 4.1 mmol/L (ref 3.5–5.2)
Sodium: 145 mmol/L — ABNORMAL HIGH (ref 134–144)
eGFR: 46 mL/min/{1.73_m2} — ABNORMAL LOW (ref >59)

## 2021-06-18 ENCOUNTER — Other Ambulatory Visit: Payer: Self-pay | Admitting: Family Medicine

## 2021-06-20 ENCOUNTER — Other Ambulatory Visit: Payer: Self-pay | Admitting: Family Medicine

## 2021-06-24 ENCOUNTER — Encounter (HOSPITAL_COMMUNITY): Payer: Self-pay | Admitting: Cardiovascular Disease

## 2021-06-26 ENCOUNTER — Other Ambulatory Visit: Payer: Self-pay | Admitting: Family Medicine

## 2021-06-30 NOTE — Patient Instructions (Signed)
Visit Information   Patient Goals/Self-Care Activities: Patient will self administer medications as prescribed Patient will attend all scheduled provider appointments Patient will continue to perform ADL's independently Patient will call provider office for new concerns or questions Patient will take medication as directed. Call PCP or seek appropriate medical attention as needed for new or worsening symptoms Patient will seek appropriate medical attention for any s/s of bleeding or anemia Patient will monitor heart rate and call cardiologist with any readings outside of recommended range Patient will check and record blood pressure daily and call PCP or cardio with any readings outside of recommended range Patient will check and record blood sugar daily and call PCP with any readings outside of recommended range Patient will have repeated CBCs at PCP office to follow hemoglobin trend Patient will call RN Care Manager as needed [phone]  Patient verbalizes understanding of instructions provided today and agrees to view in [place].   Plan:Telephone follow up appointment with care management team member scheduled for:  07/07/2021 with RNCM The patient has been provided with contact information for the care management team and has been advised to call with any health related questions or concerns.   Chong Sicilian, BSN, RN-BC Embedded Chronic Care Manager [facility] [place] [facility] Direct Dial: [phone]

## 2021-07-03 ENCOUNTER — Other Ambulatory Visit: Payer: Self-pay | Admitting: Family Medicine

## 2021-07-05 NOTE — Telephone Encounter (Signed)
Patient was prescribed 90 tablets with 2 refills by cardiology. She just needs to get a refill from the pharmacy.

## 2021-07-05 NOTE — Telephone Encounter (Signed)
Left message on patient's voicemail that her cardiologist called her medication and she needs to call the pharmacy and ask for refill.

## 2021-07-06 ENCOUNTER — Ambulatory Visit (HOSPITAL_COMMUNITY)
Admission: RE | Admit: 2021-07-06 | Discharge: 2021-07-06 | Disposition: A | Discharge status: Home / Self Care | Payer: Medicare Other | Attending: Cardiology | Admitting: Cardiology

## 2021-07-06 ENCOUNTER — Ambulatory Visit: Payer: Medicare Other | Admitting: *Deleted

## 2021-07-06 ENCOUNTER — Other Ambulatory Visit: Payer: Self-pay

## 2021-07-06 ENCOUNTER — Encounter (HOSPITAL_COMMUNITY)
Admission: RE | Disposition: A | Discharge status: Home / Self Care | Payer: Self-pay | Source: Home / Self Care | Attending: Cardiology

## 2021-07-06 ENCOUNTER — Ambulatory Visit (HOSPITAL_COMMUNITY): Payer: Medicare Other | Admitting: General Practice

## 2021-07-06 ENCOUNTER — Encounter (HOSPITAL_COMMUNITY): Payer: Self-pay | Admitting: Cardiology

## 2021-07-06 ENCOUNTER — Ambulatory Visit (HOSPITAL_BASED_OUTPATIENT_CLINIC_OR_DEPARTMENT_OTHER)
Admission: RE | Admit: 2021-07-06 | Discharge: 2021-07-06 | Disposition: A | Discharge status: Home / Self Care | Payer: Medicare Other | Source: Home / Self Care | Attending: Cardiovascular Disease | Admitting: Cardiovascular Disease

## 2021-07-06 DIAGNOSIS — I4819 Other persistent atrial fibrillation: Secondary | ICD-10-CM | POA: Insufficient documentation

## 2021-07-06 DIAGNOSIS — I5022 Chronic systolic (congestive) heart failure: Secondary | ICD-10-CM | POA: Diagnosis not present

## 2021-07-06 DIAGNOSIS — I5041 Acute combined systolic (congestive) and diastolic (congestive) heart failure: Secondary | ICD-10-CM | POA: Diagnosis not present

## 2021-07-06 DIAGNOSIS — I110 Hypertensive heart disease with heart failure: Secondary | ICD-10-CM | POA: Insufficient documentation

## 2021-07-06 DIAGNOSIS — I340 Nonrheumatic mitral (valve) insufficiency: Secondary | ICD-10-CM

## 2021-07-06 DIAGNOSIS — I081 Rheumatic disorders of both mitral and tricuspid valves: Secondary | ICD-10-CM | POA: Insufficient documentation

## 2021-07-06 DIAGNOSIS — D649 Anemia, unspecified: Secondary | ICD-10-CM

## 2021-07-06 DIAGNOSIS — I10 Essential (primary) hypertension: Secondary | ICD-10-CM

## 2021-07-06 DIAGNOSIS — I361 Nonrheumatic tricuspid (valve) insufficiency: Secondary | ICD-10-CM

## 2021-07-06 DIAGNOSIS — D500 Iron deficiency anemia secondary to blood loss (chronic): Secondary | ICD-10-CM | POA: Diagnosis not present

## 2021-07-06 DIAGNOSIS — I4891 Unspecified atrial fibrillation: Secondary | ICD-10-CM

## 2021-07-06 HISTORY — PX: TEE WITHOUT CARDIOVERSION: SHX5443

## 2021-07-06 LAB — ECHO TEE
Area-P 1/2: 2.99 cm2
MV M vel: 6.67 m/s
MV Peak grad: 178 mmHg
MV VTI: 2.77 cm2
Radius: 0.65 cm

## 2021-07-06 LAB — POCT I-STAT, CHEM 8
BUN: 25 mg/dL — ABNORMAL HIGH (ref 8–23)
Calcium, Ion: 1.42 mmol/L — ABNORMAL HIGH (ref 1.15–1.40)
Chloride: 107 mmol/L (ref 98–111)
Creatinine, Ser: 1.2 mg/dL — ABNORMAL HIGH (ref 0.44–1.00)
Glucose, Bld: 102 mg/dL — ABNORMAL HIGH (ref 70–99)
HCT: 37 % (ref 36.0–46.0)
Hemoglobin: 12.6 g/dL (ref 12.0–15.0)
Potassium: 3.4 mmol/L — ABNORMAL LOW (ref 3.5–5.1)
Sodium: 143 mmol/L (ref 135–145)
TCO2: 25 mmol/L (ref 22–32)

## 2021-07-06 SURGERY — ECHOCARDIOGRAM, TRANSESOPHAGEAL
Anesthesia: Monitor Anesthesia Care

## 2021-07-06 MED ORDER — LIDOCAINE 2% (20 MG/ML) 5 ML SYRINGE
INTRAMUSCULAR | Status: DC | PRN
  Administered 2021-07-06: 50 mg via INTRAVENOUS

## 2021-07-06 MED ORDER — EPHEDRINE SULFATE-NACL 50-0.9 MG/10ML-% IV SOSY
PREFILLED_SYRINGE | INTRAVENOUS | Status: DC | PRN
  Administered 2021-07-06 (×2): 2.5 mg via INTRAVENOUS

## 2021-07-06 MED ORDER — BUTAMBEN-TETRACAINE-BENZOCAINE 2-2-14 % EX AERO
INHALATION_SPRAY | CUTANEOUS | Status: DC | PRN
  Administered 2021-07-06: 2 via TOPICAL

## 2021-07-06 MED ORDER — PROPOFOL 10 MG/ML IV BOLUS
INTRAVENOUS | Status: DC | PRN
  Administered 2021-07-06: 20 mg via INTRAVENOUS
  Administered 2021-07-06: 10 mg via INTRAVENOUS

## 2021-07-06 MED ORDER — SODIUM CHLORIDE 0.9 % IV SOLN: INTRAVENOUS | Status: DC

## 2021-07-06 MED ORDER — PROPOFOL 500 MG/50ML IV EMUL
INTRAVENOUS | Status: DC | PRN
  Administered 2021-07-06: 100 ug/kg/min via INTRAVENOUS

## 2021-07-06 NOTE — Patient Instructions (Signed)
Visit Information  Patient Goals/Self-Care Activities: Patient will self administer medications as prescribed Patient will attend all scheduled provider appointments Patient will continue to perform ADL's independently Patient will call provider office for new concerns or questions Patient will take medication as directed. Call PCP or seek appropriate medical attention as needed for new or worsening symptoms Patient will seek appropriate medical attention for any s/s of bleeding or anemia Patient will monitor heart rate and call cardiologist with any readings outside of recommended range Patient will check and record blood pressure daily and call PCP or cardio with any readings outside of recommended range Patient will check and record blood sugar daily and call PCP with any readings outside of recommended range Patient will have repeated CBCs at PCP office to follow hemoglobin trend Patient will call RN Care Manager as needed [phone]  Patient verbalizes understanding of instructions provided today and agrees to view in [place].   Plan:Telephone follow up appointment with care management team member scheduled for:  07/22/21 wth RNCM The patient has been provided with contact information for the care management team and has been advised to call with any health related questions or concerns.   Chong Sicilian, BSN, RN-BC Embedded Chronic Care Manager [facility] / [place] [facility] Direct Dial: [phone]

## 2021-07-06 NOTE — CV Procedure (Addendum)
     TRANSESOPHAGEAL ECHOCARDIOGRAM   NAME:  [name] [name]   MRN: [id number] DOB:  08-10-1940   ADMIT DATE: 07/06/2021  INDICATIONS: Mitral regurgitation  PROCEDURE:   Informed consent was obtained prior to the procedure. The risks, benefits and alternatives for the procedure were discussed and the patient comprehended these risks.  Risks include, but are not limited to, cough, sore throat, vomiting, nausea, somnolence, esophageal and stomach trauma or perforation, bleeding, low blood pressure, aspiration, pneumonia, infection, trauma to the teeth and death.    After a procedural time-out, the oropharynx was anesthetized and the patient was sedated by the anesthesia service. The transesophageal probe was inserted in the esophagus and stomach without difficulty and multiple views were obtained. Anesthesia was monitored by Rexford Maus, CRNA and Dr Sabra Heck.    COMPLICATIONS:    There were no immediate complications.  HR was in high 30s/low 40s, sinus bradycardia.  She appeared asymptomatic.  Recommend holding toprol XL, checking home BP/HR daily for next 1 week, and f/u with Dr Percival Spanish.  FINDINGS:  Moderate MR   Oswaldo Milian MD [facility]  [address], [place], [place] [address] [phone]   9:09 AM

## 2021-07-06 NOTE — Progress Notes (Signed)
Heart rate in 30's Dr Gardiner Rhyme notified. Ok to d/c home. Metoprolol discontinued.

## 2021-07-06 NOTE — Chronic Care Management (AMB) (Signed)
Chronic Care Management   CCM RN Visit Note  07/06/2021 Name: [name] [name] MRN: [id number] DOB: [birth year]  Subjective: [name] [name] is a 80 y.o. year old female who is a primary care patient of Loman Brooklyn, FNP. The care management team was consulted for assistance with disease management and care coordination needs.    Engaged with patient by telephone for follow up visit in response to provider referral for case management and/or care coordination services.   Consent to Services:  The patient was given information about Chronic Care Management services, agreed to services, and gave verbal consent prior to initiation of services.  Please see initial visit note for detailed documentation.   Patient agreed to services and verbal consent obtained.   Assessment: Review of patient past medical history, allergies, medications, health status, including review of consultants reports, laboratory and other test data, was performed as part of comprehensive evaluation and provision of chronic care management services.   SDOH (Social Determinants of Health) assessments and interventions performed:    CCM Care Plan  No Known Allergies  Outpatient Encounter Medications as of 07/06/2021  Medication Sig   acetaminophen (TYLENOL) 325 MG tablet Take 2 tablets (650 mg total) by mouth every 6 (six) hours as needed for mild pain or headache (or Fever >/= 101).   allopurinol (ZYLOPRIM) 100 MG tablet Take 1 tablet (100 mg total) by mouth daily.   amLODipine (NORVASC) 5 MG tablet Take 1 tablet (5 mg total) by mouth daily.   apixaban (ELIQUIS) 5 MG TABS tablet Take 1 tablet (5 mg total) by mouth 2 (two) times daily.   atorvastatin (LIPITOR) 20 MG tablet Take 1 tablet (20 mg total) by mouth daily.   Cholecalciferol (VITAMIN D-3) 125 MCG (5000 UT) TABS Take 5,000 Units by mouth daily with breakfast.   diclofenac Sodium (VOLTAREN) 1 % GEL APPLY 4 GRAMS TOPICALLY 4  TIMES DAILY (Patient  taking differently: 2 g 2 (two) times daily.)   diclofenac Sodium (VOLTAREN) 1 % GEL Apply topically 4 (four) times daily.   EUTHYROX 50 MCG tablet TAKE 1 TABLET BY MOUTH ONCE DAILY BEFORE BREAKFAST (Patient taking differently: Take 50 mcg by mouth daily before breakfast.)   ferrous sulfate 325 (65 FE) MG EC tablet Take 1 tablet (325 mg total) by mouth 2 (two) times daily.   furosemide (LASIX) 20 MG tablet Take 1 tablet (20 mg total) by mouth daily.   GNP CRANBERRY EXTRACT PO Take 500 mg by mouth in the morning.   loratadine (CLARITIN) 10 MG tablet Take 10 mg by mouth daily.   ONE TOUCH ULTRA TEST test strip USE UP TO FOUR TIMES DAILY AS DIRECTED (Patient taking differently: 1 each by Other route 4 (four) times daily.)   oxybutynin (DITROPAN-XL) 10 MG 24 hr tablet TAKE 1 TABLET BY MOUTH AT  BEDTIME   pantoprazole (PROTONIX) 40 MG tablet TAKE 1 TABLET BY MOUTH  TWICE DAILY BEFORE A MEAL   vitamin B-12 1000 MCG tablet Take 1 tablet (1,000 mcg total) by mouth daily.   No facility-administered encounter medications on file as of 07/06/2021.    Patient Active Problem List   Diagnosis Date Noted   Symptomatic anemia 05/01/2021   Nonrheumatic tricuspid valve regurgitation 02/24/2021   Acute pulmonary edema (HCC)    Acute combined systolic and diastolic heart failure (HCC)    Nonrheumatic mitral valve regurgitation    Severe systolic congestive heart failure ([place]) 02/07/2021   Rapid atrial fibrillation ([place]) 02/07/2021  Atrial fibrillation with RVR ([place]) 02/04/2021   Educated about COVID-19 virus infection 02/02/2020   Iron deficiency anemia due to chronic blood loss    Occult blood in stools    Unprovoked DVT and Unprovoked Pulmonary Embolism -Dxed 03/2019 04/03/2019   Sciatica of right side 10/18/2018   Seborrheic keratosis 10/17/2016   Hyperlipidemia associated with type 2 diabetes mellitus ([place]) 10/17/2016   Overactive bladder 10/17/2016   Gastroesophageal reflux disease without  esophagitis 10/17/2016   Primary osteoarthritis involving multiple joints 10/17/2016   Chronic gout without tophus 10/17/2016   Type 2 diabetes mellitus without complication, without long-term current use of insulin ([place]) 05/16/2016   Essential hypertension 05/16/2016   Hypothyroidism 05/16/2016   Anemia 10/30/2013   Morbid obesity ([place]) 10/30/2013   S/P right THA, AA 10/29/2013    Conditions to be addressed/monitored:Atrial Fibrillation, HTN, DMII, and anemia  Care Plan : [facility] Care Plan  Updates made by Ilean China, RN since 07/06/2021 12:00 AM     Problem: Chronic Disease Management Needs (DM, HTN, HLD, Afib, chronic anticoagulation)   Priority: High     Long-Range Goal: Work with [place] Coordination Regarding Chronic Medical Conditions   Start Date: 02/02/2021  This Visit's Progress: On track  Recent Progress: On track  Priority: High  Note:   Current Barriers:  Chronic Disease Management support and education needs related to Atrial Fibrillation, HTN, HLD, and DMII  RNCM Clinical Goal(s):  Patient will continue to work with RN Care Manager and/or Social Worker to address care management and care coordination needs related to Atrial Fibrillation, HTN, HLD, and DMII as evidenced by adherence to CM Team Scheduled appointments     through collaboration with RN Care manager, provider, and care team.   Interventions: 1:1 collaboration with Loman Brooklyn, FNP regarding development and update of comprehensive plan of care as evidenced by provider attestation and co-signature  Inter-disciplinary care team collaboration (see longitudinal plan of care) Reviewed chart including relevant office notes, lab results, and consultation reports Provided with RN Care Manager contact number and encouraged to reach out as needed   Diabetes:  (Status: Condition stable. Not addressed this visit.) Lab Results  Component Value Date   HGBA1C 5.1 05/02/2021   HGBA1C  5.7 (H) 12/25/2020   HGBA1C 5.7 10/20/2020   Lab Results  Component Value Date   LDLCALC 66 02/07/2021   CREATININE 1.20 (H) 06/14/2021  Assessed patient's understanding of A1c goal: <7% Reviewed and discussed most recent A1C Reviewed medications with patient and discussed importance of medication adherence; Counseled on importance of regular laboratory monitoring as prescribed; Discussed plans with patient for ongoing care management follow up and provided patient with direct contact information for care management team; Reviewed scheduled/upcoming provider appointments including:  ; Encouraged to continue to follow an ADA/carb modified diet   Hypertension: (Status: Goal on track: NO.) Last practice recorded BP readings:  BP Readings from Last 3 Encounters:  07/06/21 (!) 176/52  06/11/21 (!) 148/80  05/18/21 (!) 192/50   Evaluation of current treatment plan related to hypertension self management and patient's adherence to plan as established by provider;   Reviewed medications with patient and discussed importance of compliance;  Discussed plans with patient for ongoing care management follow up and provided patient with direct contact information for care management team; Advised patient, providing education and rationale, to monitor blood pressure daily and record, calling PCP for findings outside established parameters;  Reviewed and discussed upcoming appointments with cardiology  and PCP Discussed recent change in medications. Metoprolol was discontinued by cardiology today due to persistent bradycardia. Patient to monitor heart rate for a week and report to Dr Percival Spanish Discussed that her blood pressure has been high at recent visits but normal at home   HDL:  (Status: Condition stable. Not addressed this visit.) Lab Results  Component Value Date   CHOL 108 02/07/2021   HDL 29 (L) 02/07/2021   LDLCALC 66 02/07/2021   TRIG 65 02/07/2021   CHOLHDL 3.7 02/07/2021    Evaluation of current treatment plan related to HLD self-management and patient's adherence to plan as established by provider. Discussed plans with patient for ongoing care management follow up and provided patient with direct contact information for care management team Reviewed and discussed recent lab results Reviewed and discussed medications and compliance Encouraged patient to continue increasing physical activity level as tolerated   A-fib:  (Status: Goal on track: YES.) Afib action plan reviewed Assessed social determinant of health barriers Discussed prescription assistance approval for Eliquis. Approved until 07/24/21. Reviewed eligibility requirements for 2023. Discussed transesophageal echo that was done this morning. Report is not available but per patient, the cardiologist was pleased with the results and said that her heart function was back to normal Discussed symptoms of Afib. Denies any feeling of palpitations or fluttering. Discussed the metoprolol was d/c today by cardio. She was advised to check and record heart reate for a week and report to Dr Percival Spanish.  Reinforced that she may notice an increase in heart rate and to call cardio with any palpitations or heart rate above recommended range Advised patient to continue to monitor heart rate and symptoms and to reach out to cardio or seek emergency medical attention if needed Encouraged to reach out to [facility] as needed   Anemia/Bleeding:  (Status: Goal on Track (progressing): YES.)  Lab Results  Component Value Date   WBC 4.8 06/14/2021   HGB 12.6 07/06/2021   HCT 37.0 07/06/2021   MCV 97 06/14/2021   PLT 241 06/14/2021  Medications reviewed Restarted Eliquis after endoscopy as directed Still taking iron Reviewed and discussed recent endoscopy with Dr Watt Climes. Patient had ablation in several areas. Discussed signs of bleeding Stool is no longer dark since having ablation Discussed that hemoglobin continues to  improve Reviewed upcoming appointments   Acute Eyelid Problem:  (Status: Goal Met.) Talked with patient by telephone regarding bilateral eyelid irritation, itching, and swelling. First noticed it 2 days ago. Has not used any different medications, eye drops, facial products, etc. Has used systane eye drops as usual. Advised that some eye drops have preservative or added ingredients that she may be allergic to.  Does not have any eyeball symptoms. Eyelid symptoms are improving at this point Discussed possibly of blepharitis. Denies any dryness or flaking.  Encouraged to continue daily Claritin. Can take a benadryl at night if needed. Advised that it may make her drowsy. Clean with gentle cleanser and pat dry. Can put a thin layer of Vaseline on eyelid to see if that will help with irritation Advised to see a provider if symptoms get worse or don't improve  Patient Goals/Self-Care Activities: Patient will self administer medications as prescribed Patient will attend all scheduled provider appointments Patient will continue to perform ADL's independently Patient will call provider office for new concerns or questions Patient will take medication as directed. Call PCP or seek appropriate medical attention as needed for new or worsening symptoms Patient will seek appropriate medical attention  for any s/s of bleeding or anemia Patient will monitor heart rate and call cardiologist with any readings outside of recommended range Patient will check and record blood pressure daily and call PCP or cardio with any readings outside of recommended range Patient will check and record blood sugar daily and call PCP with any readings outside of recommended range Patient will have repeated CBCs at PCP office to follow hemoglobin trend Patient will call RN Care Manager as needed [phone]   Plan:Telephone follow up appointment with care management team member scheduled for:  07/22/21 wth RNCM The patient  has been provided with contact information for the care management team and has been advised to call with any health related questions or concerns.   Chong Sicilian, BSN, RN-BC Embedded Chronic Care Manager [facility] Direct Dial: [phone]

## 2021-07-06 NOTE — Anesthesia Procedure Notes (Signed)
Procedure Name: MAC Date/Time: 07/06/2021 8:13 AM Performed by: Ardyth Harps, CRNA Pre-anesthesia Checklist: Patient identified, Emergency Drugs available, Suction available, Patient being monitored and Timeout performed Patient Re-evaluated:Patient Re-evaluated prior to induction Oxygen Delivery Method: Simple face mask Dental Injury: Teeth and Oropharynx as per pre-operative assessment

## 2021-07-06 NOTE — Interval H&P Note (Signed)
History and Physical Interval Note:  07/06/2021 8:10 AM  [name] [name]  has presented today for surgery, with the diagnosis of mitral regurg.  The various methods of treatment have been discussed with the patient and family. After consideration of risks, benefits and other options for treatment, the patient has consented to  Procedure(s): TRANSESOPHAGEAL ECHOCARDIOGRAM (TEE) (N/A) as a surgical intervention.  The patient's history has been reviewed, patient examined, no change in status, stable for surgery.  I have reviewed the patient's chart and labs.  Questions were answered to the patient's satisfaction.     Donato Heinz

## 2021-07-06 NOTE — Transfer of Care (Signed)
Immediate Anesthesia Transfer of Care Note  Patient: [name] [name]  Procedure(s) Performed: TRANSESOPHAGEAL ECHOCARDIOGRAM (TEE)  Patient Location: Endoscopy Unit  Anesthesia Type:MAC  Level of Consciousness: awake and alert   Airway & Oxygen Therapy: Patient Spontanous Breathing  Post-op Assessment: Report given to RN and Post -op Vital signs reviewed and stable  Post vital signs: Reviewed and stable  Last Vitals:  Vitals Value Taken Time  BP 118/85 07/06/21 0849  Temp    Pulse 69 07/06/21 0850  Resp 22 07/06/21 0850  SpO2 98 % 07/06/21 0850  Vitals shown include unvalidated device data.  Last Pain:  Vitals:   07/06/21 0745  TempSrc: Temporal  PainSc: 0-No pain         Complications: No notable events documented.

## 2021-07-06 NOTE — Progress Notes (Signed)
°  Echocardiogram Echocardiogram Transesophageal has been performed.  Darlina Sicilian M 07/06/2021, 9:29 AM

## 2021-07-06 NOTE — Anesthesia Preprocedure Evaluation (Signed)
Anesthesia Evaluation  Patient identified by MRN, date of birth, ID band Patient awake    Reviewed: Allergy & Precautions, NPO status , Patient's Chart, lab work & pertinent test results  Airway Mallampati: II  TM Distance: >3 FB Neck ROM: Full    Dental no notable dental hx.    Pulmonary neg pulmonary ROS,    Pulmonary exam normal breath sounds clear to auscultation       Cardiovascular hypertension, Pt. on medications and Pt. on home beta blockers +CHF and + DVT  Normal cardiovascular exam+ dysrhythmias (on Eliquis) Atrial Fibrillation  Rhythm:Irregular Rate:Normal     Neuro/Psych  Headaches, negative psych ROS   GI/Hepatic Neg liver ROS, GERD  Medicated,GIB   Endo/Other  diabetesHypothyroidism   Renal/GU negative Renal ROS  negative genitourinary   Musculoskeletal  (+) Arthritis ,   Abdominal (+)  Abdomen: soft. Bowel sounds: normal.  Peds  Hematology  (+) anemia ,   Anesthesia Other Findings   Reproductive/Obstetrics                             Anesthesia Physical  Anesthesia Plan  ASA: 3  Anesthesia Plan: MAC   Post-op Pain Management:    Induction: Intravenous  PONV Risk Score and Plan: 2 and Treatment may vary due to age or medical condition and Propofol infusion  Airway Management Planned: Simple Face Mask, Natural Airway and Nasal Cannula  Additional Equipment: None  Intra-op Plan:   Post-operative Plan:   Informed Consent: I have reviewed the patients History and Physical, chart, labs and discussed the procedure including the risks, benefits and alternatives for the proposed anesthesia with the patient or authorized representative who has indicated his/her understanding and acceptance.     Dental advisory given  Plan Discussed with: CRNA  Anesthesia Plan Comments:         Anesthesia Quick Evaluation

## 2021-07-06 NOTE — Discharge Instructions (Signed)

## 2021-07-07 ENCOUNTER — Telehealth: Payer: Medicare Other

## 2021-07-07 NOTE — Anesthesia Postprocedure Evaluation (Signed)
Anesthesia Post Note  Patient: [name] [name]  Procedure(s) Performed: TRANSESOPHAGEAL ECHOCARDIOGRAM (TEE)     Anesthesia Post Evaluation No notable events documented.  Last Vitals:  Vitals:   07/06/21 0900 07/06/21 0910  BP: (!) 157/54 (!) 176/52  Pulse: (!) 42 (!) 39  Resp: 20 20  Temp:    SpO2: 98% 100%    Last Pain:  Vitals:   07/06/21 0910  TempSrc:   PainSc: 0-No pain                 Lynda Rainwater

## 2021-07-08 ENCOUNTER — Encounter (HOSPITAL_COMMUNITY): Payer: Self-pay | Admitting: Cardiology

## 2021-07-08 ENCOUNTER — Other Ambulatory Visit: Payer: Self-pay

## 2021-07-08 ENCOUNTER — Encounter: Payer: Self-pay | Admitting: Cardiology

## 2021-07-08 DIAGNOSIS — E876 Hypokalemia: Secondary | ICD-10-CM

## 2021-07-09 ENCOUNTER — Other Ambulatory Visit: Payer: Self-pay | Admitting: *Deleted

## 2021-07-09 DIAGNOSIS — E876 Hypokalemia: Secondary | ICD-10-CM

## 2021-07-09 MED ORDER — AMLODIPINE BESYLATE 5 MG PO TABS: 7.5000 mg | ORAL_TABLET | Freq: Every day | ORAL | 3 refills | Status: DC

## 2021-07-12 ENCOUNTER — Other Ambulatory Visit: Payer: Self-pay | Admitting: Family Medicine

## 2021-07-13 ENCOUNTER — Encounter: Payer: Medicare Other | Admitting: Family Medicine

## 2021-07-13 ENCOUNTER — Encounter: Payer: Self-pay | Admitting: Family Medicine

## 2021-07-16 ENCOUNTER — Ambulatory Visit: Payer: Medicare Other | Admitting: Cardiology

## 2021-07-19 ENCOUNTER — Encounter: Payer: Self-pay | Admitting: Cardiology

## 2021-07-20 MED ORDER — FUROSEMIDE 20 MG PO TABS: 20.0000 mg | ORAL_TABLET | Freq: Every day | ORAL | 2 refills | Status: DC

## 2021-07-22 ENCOUNTER — Other Ambulatory Visit: Payer: Medicare Other

## 2021-07-22 ENCOUNTER — Telehealth: Payer: Medicare Other

## 2021-07-22 DIAGNOSIS — E876 Hypokalemia: Secondary | ICD-10-CM | POA: Diagnosis not present

## 2021-07-23 LAB — BASIC METABOLIC PANEL
BUN/Creatinine Ratio: 15 (ref 12–28)
BUN: 17 mg/dL (ref 8–27)
CO2: 27 mmol/L (ref 20–29)
Calcium: 11.7 mg/dL — ABNORMAL HIGH (ref 8.7–10.3)
Chloride: 105 mmol/L (ref 96–106)
Creatinine, Ser: 1.11 mg/dL — ABNORMAL HIGH (ref 0.57–1.00)
Glucose: 113 mg/dL — ABNORMAL HIGH (ref 70–99)
Potassium: 4.5 mmol/L (ref 3.5–5.2)
Sodium: 143 mmol/L (ref 134–144)
eGFR: 51 mL/min/{1.73_m2} — ABNORMAL LOW (ref >59)

## 2021-07-30 ENCOUNTER — Ambulatory Visit (INDEPENDENT_AMBULATORY_CARE_PROVIDER_SITE_OTHER): Payer: PPO | Admitting: Family Medicine

## 2021-07-30 ENCOUNTER — Encounter: Payer: Self-pay | Admitting: Family Medicine

## 2021-07-30 VITALS — BP 171/86 | HR 58 | Temp 97.5°F | Ht 63.0 in | Wt 198.4 lb

## 2021-07-30 DIAGNOSIS — N1831 Chronic kidney disease, stage 3a: Secondary | ICD-10-CM | POA: Diagnosis not present

## 2021-07-30 DIAGNOSIS — I10 Essential (primary) hypertension: Secondary | ICD-10-CM

## 2021-07-30 DIAGNOSIS — I5020 Unspecified systolic (congestive) heart failure: Secondary | ICD-10-CM

## 2021-07-30 DIAGNOSIS — I482 Chronic atrial fibrillation, unspecified: Secondary | ICD-10-CM | POA: Diagnosis not present

## 2021-07-30 DIAGNOSIS — E039 Hypothyroidism, unspecified: Secondary | ICD-10-CM

## 2021-07-30 DIAGNOSIS — M109 Gout, unspecified: Secondary | ICD-10-CM

## 2021-07-30 DIAGNOSIS — D500 Iron deficiency anemia secondary to blood loss (chronic): Secondary | ICD-10-CM

## 2021-07-30 DIAGNOSIS — Z0001 Encounter for general adult medical examination with abnormal findings: Secondary | ICD-10-CM | POA: Diagnosis not present

## 2021-07-30 DIAGNOSIS — K219 Gastro-esophageal reflux disease without esophagitis: Secondary | ICD-10-CM

## 2021-07-30 DIAGNOSIS — Z23 Encounter for immunization: Secondary | ICD-10-CM | POA: Diagnosis not present

## 2021-07-30 DIAGNOSIS — Z0000 Encounter for general adult medical examination without abnormal findings: Secondary | ICD-10-CM

## 2021-07-30 DIAGNOSIS — H1013 Acute atopic conjunctivitis, bilateral: Secondary | ICD-10-CM

## 2021-07-30 DIAGNOSIS — E669 Obesity, unspecified: Secondary | ICD-10-CM

## 2021-07-30 DIAGNOSIS — I340 Nonrheumatic mitral (valve) insufficiency: Secondary | ICD-10-CM

## 2021-07-30 DIAGNOSIS — E785 Hyperlipidemia, unspecified: Secondary | ICD-10-CM

## 2021-07-30 MED ORDER — DAPAGLIFLOZIN PROPANEDIOL 10 MG PO TABS: ORAL_TABLET | ORAL | 1 refills | Status: DC

## 2021-07-30 MED ORDER — OLOPATADINE HCL 0.1 % OP SOLN: 1.0000 [drp] | Freq: Two times a day (BID) | OPHTHALMIC | 2 refills | Status: DC

## 2021-07-30 NOTE — Progress Notes (Signed)
Assessment & Plan:  1. Well adult exam Preventive health education provided. - CBC with Differential/Platelet - CMP14+EGFR - Lipid panel - Pneumococcal conjugate vaccine 20-valent (Prevnar 20)  2. Essential hypertension Elevated - patient will discuss with cardiology next week. - CBC with Differential/Platelet - CMP14+EGFR - Lipid panel  3. Chronic a-fib ([place]) Managed by cardiology. Patient to keep appointment next week.  - CBC with Differential/Platelet - CMP14+EGFR  4. Severe systolic congestive heart failure ([place]) Managed by cardiology. Patient to keep appointment next week.  - CBC with Differential/Platelet - CMP14+EGFR  5. Nonrheumatic mitral valve regurgitation Managed by cardiology. Patient to keep appointment next week.   6. Dyslipidemia Managed by cardiology. Patient to keep appointment next week.  - CMP14+EGFR - Lipid panel  7. Stage 3a chronic [name] disease ([place]) Started [place] today. - dapagliflozin propanediol (FARXIGA) 10 MG TABS tablet; Take 5 mg (1/2 tablet) by mouth once daily x1 week, then increase to 10 mg daily.  Dispense: 90 tablet; Refill: 1 - CMP14+EGFR - Pneumococcal conjugate vaccine 20-valent (Prevnar 20)  8. Acquired hypothyroidism Well controlled on current regimen.  - TSH - T4, free  9. Gastroesophageal reflux disease without esophagitis Well controlled on current regimen.  - CMP14+EGFR  10. Controlled gout Well controlled on current regimen.  - CMP14+EGFR  11. Iron deficiency anemia due to chronic blood loss Discussed if H/H stable she can stop iron supplement since bleed has been taken care of. - CBC with Differential/Platelet  12. Obesity (BMI 30-39.9) Encouraged healthy diet and exercise. - CBC with Differential/Platelet - CMP14+EGFR - Lipid panel  13. Allergic conjunctivitis of both eyes - olopatadine (PATADAY) 0.1 % ophthalmic solution; Place 1 drop into both eyes 2 (two) times daily.  Dispense: 5 mL; Refill:  2  14. Immunization due - Pneumococcal conjugate vaccine 20-valent (Prevnar 20)   Follow-up: Return in about 3 months (around 10/28/2021) for follow-up of chronic medication conditions.   [name], MSN, APRN, FNP-C [facility]  Subjective:  Patient ID: [name] [name], female    DOB: [birth year]  Age: 81 y.o. MRN: [id number]  Patient Care Team: Loman Brooklyn, FNP as PCP - General (Family Medicine) Minus Breeding, MD as PCP - Cardiology (Cardiology) Paralee Cancel, MD as Consulting Physician (Orthopedic Surgery) Ilean China, RN as Case Manager [name], NP (Gastroenterology) Minus Breeding, MD as Consulting Physician (Cardiology) Celestia Khat, [place] (Optometry) Lavera Guise, [facility] as Pharmacist (Family Medicine) Lendon Colonel, NP as Nurse Practitioner (Cardiology)   CC:  Chief Complaint  Patient presents with   Annual Exam   eye itching    Patient states in the last 3 months she has 3 episodes of bilateral itching eyes and swelling that would last 2 weeks at a time.     HPI [name] [name] presents for her annual physical.  Occupation: retired from tobacco farming, Marital status: widowed, Substance use: none Diet: Low salt, Exercise: walking some Last eye exam: years Last dental exam: full dentures DEXA: 08/31/2017 (osteopenia) Hepatitis C Screening: declined Immunizations: Flu Vaccine: up to date Tdap Vaccine: declined  Shingrix Vaccine: up to date  COVID-19 Vaccine: up to date Pneumonia Vaccine:  will get today  Advanced Directives Patient does have advanced directives including living will and healthcare power of attorney. She does have a copy in the electronic medical record.   DEPRESSION SCREENING PHQ 2/9 Scores 07/30/2021 05/10/2021 03/23/2021 02/24/2021 01/21/2021 01/05/2021 12/22/2020  PHQ - 2 Score 0 0 0 0 0  0 0  PHQ- 9 Score 0 0 0 - 0 0 -     Hypertension: patient does check her blood pressure at home and  reports her readings are 140-150s/70s. She is scheduled to follow-up with cardiology next week after a recent increase in amlodipine from 5 mg to 7.5 mg daily. She does eat a low salt diet and has been trying to walk more.  A-Fib: tolerating Eliquis with no further bleeding episodes. She is now able to afford it since being set up with our clinical pharmacist for prescription assistance and is very happy about this.  Dyslipidemia: taking atorvastatin daily.   Nonrheumatic mitral valve regurgitation: patient had a transesophageal echocardiogram on 07/06/2021 with Dr. Gardiner Rhyme.  At that time she was advised to hold her Toprol-XL due to sinus bradycardia with heart rate in the 30s and low 40s. She will follow-up with cardiology next week for the results, but says they did already tell her it was not as bad as they originally thought.  Anemia: patient had an EGD completed on 05/18/2021 that revealed gastric antral vascular ectasia, which was treated with radiofrequency ablation. She has continued to take the iron supplement twice daily and has been experiencing constipation because of it. Her last H/H was 11.4/36.2 on 06/14/2021.   Gout: no flares with Allopurinol 100 mg daily.   Hypothyroidism: taking levothyroxine daily as prescribed.  GERD: taking Protonix daily.   Patient reports both eyes have been itching and she has had three episodes of swelling and drainage which mattes the eye shut by morning. Episodes last 2+ weeks each time.    Review of Systems  Constitutional:  Negative for chills, fever, malaise/fatigue and weight loss.  HENT:  Negative for congestion, ear discharge, ear pain, nosebleeds, sinus pain, sore throat and tinnitus.   Eyes:  Positive for discharge and redness. Negative for blurred vision, double vision and pain.  Respiratory:  Negative for cough, shortness of breath and wheezing.   Cardiovascular:  Negative for chest pain, palpitations and leg swelling.   Gastrointestinal:  Negative for abdominal pain, blood in stool, constipation, diarrhea, heartburn, melena, nausea and vomiting.  Genitourinary:  Negative for dysuria, frequency and urgency.  Musculoskeletal:  Positive for joint pain. Negative for myalgias.  Skin:  Negative for rash.  Neurological:  Negative for dizziness, seizures, weakness and headaches.  Psychiatric/Behavioral:  Negative for depression, substance abuse and suicidal ideas. The patient is not nervous/anxious.     Current Outpatient Medications:    acetaminophen (TYLENOL) 325 MG tablet, Take 2 tablets (650 mg total) by mouth every 6 (six) hours as needed for mild pain or headache (or Fever >/= 101)., Disp: 12 tablet, Rfl: 0   allopurinol (ZYLOPRIM) 100 MG tablet, Take 1 tablet (100 mg total) by mouth daily., Disp: 90 tablet, Rfl: 1   amLODipine (NORVASC) 5 MG tablet, Take 1.5 tablets (7.5 mg total) by mouth daily., Disp: 90 tablet, Rfl: 3   apixaban (ELIQUIS) 5 MG TABS tablet, Take 1 tablet (5 mg total) by mouth 2 (two) times daily., Disp: 90 tablet, Rfl: 3   atorvastatin (LIPITOR) 20 MG tablet, Take 1 tablet (20 mg total) by mouth daily., Disp: 90 tablet, Rfl: 1   Cholecalciferol (VITAMIN D-3) 125 MCG (5000 UT) TABS, Take 5,000 Units by mouth daily with breakfast., Disp: , Rfl:    diclofenac Sodium (VOLTAREN) 1 % GEL, APPLY 4 GRAMS TOPICALLY 4  TIMES DAILY (Patient taking differently: 2 g 2 (two) times daily.), Disp: 300 g, Rfl: 0  diclofenac Sodium (VOLTAREN) 1 % GEL, Apply topically 4 (four) times daily., Disp: , Rfl:    EUTHYROX 50 MCG tablet, TAKE 1 TABLET BY MOUTH ONCE DAILY BEFORE BREAKFAST (Patient taking differently: Take 50 mcg by mouth daily before breakfast.), Disp: 30 tablet, Rfl: 10   ferrous sulfate 325 (65 FE) MG EC tablet, Take 1 tablet (325 mg total) by mouth 2 (two) times daily., Disp: 60 tablet, Rfl: 2   furosemide (LASIX) 20 MG tablet, Take 1 tablet (20 mg total) by mouth daily., Disp: 90 tablet, Rfl: 2    GNP CRANBERRY EXTRACT PO, Take 500 mg by mouth in the morning., Disp: , Rfl:    loratadine (CLARITIN) 10 MG tablet, Take 10 mg by mouth daily., Disp: , Rfl:    ONE TOUCH ULTRA TEST test strip, USE UP TO FOUR TIMES DAILY AS DIRECTED (Patient taking differently: 1 each by Other route 4 (four) times daily.), Disp: 400 each, Rfl: 3   oxybutynin (DITROPAN-XL) 10 MG 24 hr tablet, TAKE 1 TABLET BY MOUTH AT  BEDTIME, Disp: 90 tablet, Rfl: 0   pantoprazole (PROTONIX) 40 MG tablet, TAKE 1 TABLET BY MOUTH  TWICE DAILY BEFORE A MEAL, Disp: 180 tablet, Rfl: 0   vitamin B-12 1000 MCG tablet, Take 1 tablet (1,000 mcg total) by mouth daily., Disp: 30 tablet, Rfl: 2  No Known Allergies  Past Medical History:  Diagnosis Date   Anemia    Arthritis    Ostearthritis- hips, knees, fingers   Diabetes mellitus without complication (HCC)    DVT (deep venous thrombosis) (HCC)    Dyspnea    GERD (gastroesophageal reflux disease)    Headache(784.0)    tx. Valproic acid   History of [name] stones    Hypertension    Hypothyroidism    Pulmonary embolism [facility])     Past Surgical History:  Procedure Laterality Date   ABDOMINAL HYSTERECTOMY     BILATERAL HIP ARTHROSCOPY Left    BIOPSY  12/08/2019   Procedure: BIOPSY;  Surgeon: Daneil Dolin, MD;  [facility]: [facility];  Service: Endoscopy;;   CATARACT EXTRACTION, BILATERAL     COLONOSCOPY N/A 12/08/2019   polyps (tubular adenoma), diverticulosis, colonic lipoma, no surveillance due to age   47 W/ [place] Right 12/25/2020   Procedure: CYSTOSCOPY WITH RETROGRADE PYELOGRAM/URETERAL STENT PLACEMENT;  Surgeon: Janith Lima, MD;  Location: [facility];  Service: Urology;  Laterality: Right;   CYSTOSCOPY/URETEROSCOPY/HOLMIUM LASER/STENT PLACEMENT Right 01/15/2021   Procedure: CYSTOSCOPY/RETROGRADE/URETEROSCOPY/HOLMIUM LASER/STENT EXCHANGE;  Surgeon: Janith Lima, MD;  Location: [facility];  Service: Urology;  Laterality: Right;    ESOPHAGOGASTRODUODENOSCOPY N/A 12/08/2019   normal esophagus with 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   ESOPHAGOGASTRODUODENOSCOPY (EGD) WITH PROPOFOL N/A 05/18/2021   Procedure: ESOPHAGOGASTRODUODENOSCOPY (EGD) WITH PROPOFOL;  Surgeon: Clarene Essex, MD;  [facility];  Service: Endoscopy;  Laterality: N/A;  RFA; APC; ultraslim scope   GIVENS CAPSULE STUDY N/A 01/13/2020   Procedure: GIVENS CAPSULE STUDY;  Surgeon: Daneil Dolin, MD;  [facility];  Service: Endoscopy;  Laterality: N/A;  7:30am   GIVENS CAPSULE STUDY N/A 05/04/2021   Procedure: GIVENS CAPSULE STUDY;  Surgeon: Clarene Essex, MD;  [facility]: [facility];  Service: Endoscopy;  Laterality: N/A;   MULTIPLE TOOTH EXTRACTIONS     60's   PARATHYROIDECTOMY     POLYPECTOMY  12/08/2019   Procedure: POLYPECTOMY;  Surgeon: Daneil Dolin, MD;  [facility];  Service: Endoscopy;;  RADIOFREQUENCY ABLATION  05/18/2021   Procedure: RADIO FREQUENCY ABLATION;  Surgeon: Clarene Essex, MD;  [facility];  Service: Endoscopy;;   TEE WITHOUT CARDIOVERSION N/A 07/06/2021   Procedure: TRANSESOPHAGEAL ECHOCARDIOGRAM (TEE);  Surgeon: Donato Heinz, MD;  Location: [facility] ENDOSCOPY;  Service: Cardiovascular;  Laterality: N/A;   TOTAL HIP ARTHROPLASTY Right 10/29/2013   Procedure: RIGHT TOTAL HIP ARTHROPLASTY ANTERIOR APPROACH;  Surgeon: Mauri Pole, MD;  Location: [facility];  Service: Orthopedics;  Laterality: Right;    Family History  Problem Relation Age of Onset   Colitis Sister 32       alive   Cancer Sister 104       colon   Cancer Mother 73       uterine   Heart disease Father 78       heart failure   Heart attack Brother 71   Healthy Daughter    Healthy Son    Pulmonary embolism Brother 21   Heart disease Brother    Healthy Son    Healthy Son     Social History   Socioeconomic History   Marital status: Widowed    Spouse name: Not on file    Number of children: 4   Years of education: 12   Highest education level: High school graduate  Occupational History   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 She has four adult children and one son lives with her.    Social Determinants of Health   Financial Resource Strain: Low Risk    Difficulty of Paying Living Expenses: Not hard at all  Food Insecurity: No Food Insecurity   Worried About Charity fundraiser in the Last Year: Never true   [place] in the Last Year: Never true  Transportation Needs: No Transportation Needs   Lack of Transportation (Medical): No   Lack of Transportation (Non-Medical): No  Physical Activity: Insufficiently Active   Days of Exercise per Week: 7 days   Minutes of Exercise per Session: 20 min  Stress: No Stress Concern Present   Feeling of Stress : Not at all  Social Connections: Moderately Integrated   Frequency of Communication with Friends and Family: More than three times a week   Frequency of Social Gatherings with Friends and Family: More than three times a week   Attends Religious Services: More than 4 times per year   Active Member of [employer] or Organizations: Yes   Attends Archivist Meetings: More than 4 times per year   Marital Status: Widowed  Human resources officer Violence: Not At Risk   Fear of Current or Ex-Partner: No   Emotionally Abused: No   Physically Abused: No   Sexually Abused: No      Objective:    BP (!) 171/86    Pulse (!) 58    Temp (!) 97.5 F (36.4 C) (Temporal)    Ht 5' 3"  (1.6 m)    Wt 198 lb 6.4 oz (90 kg)    SpO2 95%    BMI 35.14  kg/m    Wt Readings from Last 3 Encounters:  07/30/21 198 lb 6.4 oz (90 kg)  07/06/21 197 lb (89.4 kg)  06/11/21 196 lb (88.9 kg)    Physical Exam Vitals reviewed.  Constitutional:      General: She is not in acute distress.    Appearance: Normal appearance. She is obese. She is not ill-appearing, toxic-appearing or diaphoretic.  HENT:     Head: Normocephalic and atraumatic.     Right Ear: Tympanic membrane, ear canal and external ear normal. There is no impacted cerumen.     Left Ear: Tympanic membrane, ear canal and external ear normal. There is no impacted cerumen.     Nose: Nose normal. No congestion or rhinorrhea.     Mouth/Throat:     Mouth: Mucous membranes are moist.     Pharynx: Oropharynx is clear. No oropharyngeal exudate or posterior oropharyngeal erythema.  Eyes:     General: No scleral icterus.       Right eye: No discharge.        Left eye: No discharge.     Conjunctiva/sclera: Conjunctivae normal.     Pupils: Pupils are equal, round, and reactive to light.  Cardiovascular:     Rate and Rhythm: Normal rate and regular rhythm.     Heart sounds: Normal heart sounds. No murmur heard.   No friction rub. No gallop.  Pulmonary:     Effort: Pulmonary effort is normal. No respiratory distress.     Breath sounds: Normal breath sounds. No stridor. No wheezing, rhonchi or rales.  Abdominal:     General: Abdomen is flat. Bowel sounds are normal. There is no distension.     Palpations: Abdomen is soft. There is no hepatomegaly, splenomegaly or mass.     Tenderness: There is no abdominal tenderness. There is no guarding or rebound.     Hernia: No hernia is present.  Musculoskeletal:        General: Normal range of motion.     Cervical back: Normal range of motion and neck supple. No rigidity. No muscular tenderness.  Lymphadenopathy:     Cervical: No cervical adenopathy.  Skin:    General: Skin is warm and dry.     Capillary Refill: Capillary refill takes less than 2 seconds.   Neurological:     General: No focal deficit present.     Mental Status: She is alert and oriented to person, place, and time. Mental status is at baseline.  Psychiatric:        Mood and Affect: Mood normal.        Behavior: Behavior normal.        Thought Content: Thought content normal.        Judgment: Judgment normal.    Lab Results  Component Value Date   TSH 3.605 02/07/2021   Lab Results  Component Value Date   WBC 4.8 06/14/2021   HGB 12.6 07/06/2021   HCT 37.0 07/06/2021   MCV 97 06/14/2021   PLT 241 06/14/2021   Lab Results  Component Value Date   NA 143 07/22/2021   K 4.5 07/22/2021   CO2 27 07/22/2021   GLUCOSE 113 (H) 07/22/2021   BUN 17 07/22/2021   CREATININE 1.11 (H) 07/22/2021   BILITOT 0.5 05/02/2021   ALKPHOS 61 05/02/2021   AST 12 (L) 05/02/2021   ALT 11 05/02/2021   PROT 5.7 (L) 05/02/2021   ALBUMIN 3.4 (L) 05/02/2021   CALCIUM 11.7 (H) 07/22/2021  ANIONGAP 7 05/04/2021   EGFR 51 (L) 07/22/2021   Lab Results  Component Value Date   CHOL 108 02/07/2021   Lab Results  Component Value Date   HDL 29 (L) 02/07/2021   Lab Results  Component Value Date   LDLCALC 66 02/07/2021   Lab Results  Component Value Date   TRIG 65 02/07/2021   Lab Results  Component Value Date   CHOLHDL 3.7 02/07/2021   Lab Results  Component Value Date   HGBA1C 5.1 05/02/2021

## 2021-07-31 LAB — CBC WITH DIFFERENTIAL/PLATELET
Basophils Absolute: 0.1 10*3/uL (ref 0.0–0.2)
Basos: 1 %
EOS (ABSOLUTE): 0.2 10*3/uL (ref 0.0–0.4)
Eos: 3 %
Hematocrit: 40.9 % (ref 34.0–46.6)
Hemoglobin: 13.4 g/dL (ref 11.1–15.9)
Immature Grans (Abs): 0 10*3/uL (ref 0.0–0.1)
Immature Granulocytes: 0 %
Lymphocytes Absolute: 1.7 10*3/uL (ref 0.7–3.1)
Lymphs: 25 %
MCH: 30.5 pg (ref 26.6–33.0)
MCHC: 32.8 g/dL (ref 31.5–35.7)
MCV: 93 fL (ref 79–97)
Monocytes Absolute: 0.7 10*3/uL (ref 0.1–0.9)
Monocytes: 10 %
Neutrophils Absolute: 4.1 10*3/uL (ref 1.4–7.0)
Neutrophils: 61 %
Platelets: 257 10*3/uL (ref 150–450)
RBC: 4.4 x10E6/uL (ref 3.77–5.28)
RDW: 12.7 % (ref 11.7–15.4)
WBC: 6.7 10*3/uL (ref 3.4–10.8)

## 2021-07-31 LAB — LIPID PANEL
Chol/HDL Ratio: 3.1 ratio (ref 0.0–4.4)
Cholesterol, Total: 179 mg/dL (ref 100–199)
HDL: 58 mg/dL (ref >39)
LDL Chol Calc (NIH): 97 mg/dL (ref 0–99)
Triglycerides: 137 mg/dL (ref 0–149)
VLDL Cholesterol Cal: 24 mg/dL (ref 5–40)

## 2021-07-31 LAB — TSH: TSH: 2.53 u[IU]/mL (ref 0.450–4.500)

## 2021-07-31 LAB — CMP14+EGFR
ALT: 23 IU/L (ref 0–32)
AST: 18 IU/L (ref 0–40)
Albumin/Globulin Ratio: 2 (ref 1.2–2.2)
Albumin: 4.7 g/dL (ref 3.7–4.7)
Alkaline Phosphatase: 132 IU/L — ABNORMAL HIGH (ref 44–121)
BUN/Creatinine Ratio: 24 (ref 12–28)
BUN: 28 mg/dL — ABNORMAL HIGH (ref 8–27)
Bilirubin Total: 0.2 mg/dL (ref 0.0–1.2)
CO2: 24 mmol/L (ref 20–29)
Calcium: 12 mg/dL — ABNORMAL HIGH (ref 8.7–10.3)
Chloride: 105 mmol/L (ref 96–106)
Creatinine, Ser: 1.19 mg/dL — ABNORMAL HIGH (ref 0.57–1.00)
Globulin, Total: 2.3 g/dL (ref 1.5–4.5)
Glucose: 108 mg/dL — ABNORMAL HIGH (ref 70–99)
Potassium: 4.7 mmol/L (ref 3.5–5.2)
Sodium: 144 mmol/L (ref 134–144)
Total Protein: 7 g/dL (ref 6.0–8.5)
eGFR: 47 mL/min/{1.73_m2} — ABNORMAL LOW (ref >59)

## 2021-07-31 LAB — T4, FREE: Free T4: 1.58 ng/dL (ref 0.82–1.77)

## 2021-08-01 DIAGNOSIS — Q233 Congenital mitral insufficiency: Secondary | ICD-10-CM | POA: Insufficient documentation

## 2021-08-01 DIAGNOSIS — R42 Dizziness and giddiness: Secondary | ICD-10-CM | POA: Insufficient documentation

## 2021-08-01 NOTE — Progress Notes (Signed)
Cardiology Office Note   Date:  08/03/2021   ID:  [name] [name] 1941-04-01, MRN [id number]  PCP:  [name] Brooklyn, FNP  Cardiologist:   Minus Breeding, MD   Chief Complaint  Patient presents with   Mitral Regurgitation       History of Present Illness: [name] [name] is a 81 y.o. female who presents for management of chronic systolic CHF and persistent atrial fibrillation with CHADS  VASC Score of 6.   Echocardiogram on 02/07/21 revealed 35% to 40%, moderate elevated PASP with estimated RVSP 51.5 mmHg, severely dilated LA, moderate dilated RA, severe MR moderate TR, mild aortic sclerosis.  She did have a repeat echocardiogram which was completed on 04/01/2021.  She was found to have a normal ejection fraction of 55 to 60% with normal LV function.  It was however found that she had severe mitral valve regurgitation with no evidence of mitral valve stenosis, moderate mitral annular calcification.  PI SA ER O0 0.44 cm, MR radius 1.1 cm and MR regurgitant volume of 92 cc.  She was recommended for TEE for further assessment of mitral regurgitation.   She had this in Dec and her MR was thought to be moderate and this was reviewed by the structural team and intervention was not suggested.    Since I last saw her she is actually done very well.  She feels much better with her anemia corrected.  She gets around with a cane. The patient denies any new symptoms such as chest discomfort, neck or arm discomfort. There has been no new shortness of breath, PND or orthopnea. There have been no reported palpitations, presyncope or syncope.    Past Medical History:  Diagnosis Date   Anemia    Arthritis    Ostearthritis- hips, knees, fingers   DVT (deep venous thrombosis) (HCC)    Dyspnea    GERD (gastroesophageal reflux disease)    Headache(784.0)    tx. Valproic acid   History of diabetes mellitus    History of kidney stones    Hypertension    Hypothyroidism    Pulmonary  embolism [facility])     Past Surgical History:  Procedure Laterality Date   ABDOMINAL HYSTERECTOMY     BILATERAL HIP ARTHROSCOPY Left    BIOPSY  12/08/2019   Procedure: BIOPSY;  Surgeon: Daneil Dolin, MD;  [facility]: [facility];  Service: Endoscopy;;   CATARACT EXTRACTION, BILATERAL     COLONOSCOPY N/A 12/08/2019   polyps (tubular adenoma), diverticulosis, colonic lipoma, no surveillance due to age   43 W/ [place] Right 12/25/2020   Procedure: CYSTOSCOPY WITH RETROGRADE PYELOGRAM/URETERAL STENT PLACEMENT;  Surgeon: Janith Lima, MD;  Location: [facility];  Service: Urology;  Laterality: Right;   CYSTOSCOPY/URETEROSCOPY/HOLMIUM LASER/STENT PLACEMENT Right 01/15/2021   Procedure: CYSTOSCOPY/RETROGRADE/URETEROSCOPY/HOLMIUM LASER/STENT EXCHANGE;  Surgeon: Janith Lima, MD;  Location: [facility];  Service: Urology;  Laterality: Right;   ESOPHAGOGASTRODUODENOSCOPY N/A 12/08/2019   normal esophagus with 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   ESOPHAGOGASTRODUODENOSCOPY (EGD) WITH PROPOFOL N/A 05/18/2021   Procedure: ESOPHAGOGASTRODUODENOSCOPY (EGD) WITH PROPOFOL;  Surgeon: Clarene Essex, MD;  [facility];  Service: Endoscopy;  Laterality: N/A;  RFA; APC; ultraslim scope   GIVENS CAPSULE STUDY N/A 01/13/2020   Procedure: GIVENS CAPSULE STUDY;  Surgeon: Daneil Dolin, MD;  [facility];  Service: Endoscopy;  Laterality: N/A;  7:30am   GIVENS CAPSULE STUDY N/A 05/04/2021   Procedure: GIVENS  CAPSULE STUDY;  Surgeon: Clarene Essex, MD;  Location: [facility];  Service: Endoscopy;  Laterality: N/A;   MULTIPLE TOOTH EXTRACTIONS     60's   PARATHYROIDECTOMY     POLYPECTOMY  12/08/2019   Procedure: POLYPECTOMY;  Surgeon: Daneil Dolin, MD;  [facility];  Service: Endoscopy;;   RADIOFREQUENCY ABLATION  05/18/2021   Procedure: RADIO FREQUENCY ABLATION;  Surgeon: Clarene Essex, MD;  [facility];  Service:  Endoscopy;;   TEE WITHOUT CARDIOVERSION N/A 07/06/2021   Procedure: TRANSESOPHAGEAL ECHOCARDIOGRAM (TEE);  Surgeon: Donato Heinz, MD;  Location: [facility] ENDOSCOPY;  Service: Cardiovascular;  Laterality: N/A;   TOTAL HIP ARTHROPLASTY Right 10/29/2013   Procedure: RIGHT TOTAL HIP ARTHROPLASTY ANTERIOR APPROACH;  Surgeon: Mauri Pole, MD;  Location: [facility];  Service: Orthopedics;  Laterality: Right;     Current Outpatient Medications  Medication Sig Dispense Refill   acetaminophen (TYLENOL) 325 MG tablet Take 2 tablets (650 mg total) by mouth every 6 (six) hours as needed for mild pain or headache (or Fever >/= 101). 12 tablet 0   allopurinol (ZYLOPRIM) 100 MG tablet Take 1 tablet (100 mg total) by mouth daily. 90 tablet 1   apixaban (ELIQUIS) 5 MG TABS tablet Take 1 tablet (5 mg total) by mouth 2 (two) times daily. 90 tablet 3   atorvastatin (LIPITOR) 20 MG tablet Take 1 tablet (20 mg total) by mouth daily. 90 tablet 1   Cholecalciferol (VITAMIN D-3) 125 MCG (5000 UT) TABS Take 5,000 Units by mouth daily with breakfast.     dapagliflozin propanediol (FARXIGA) 10 MG TABS tablet Take 5 mg (1/2 tablet) by mouth once daily x1 week, then increase to 10 mg daily. 90 tablet 1   diclofenac Sodium (VOLTAREN) 1 % GEL APPLY 4 GRAMS TOPICALLY 4  TIMES DAILY (Patient taking differently: 2 g 2 (two) times daily.) 300 g 0   EUTHYROX 50 MCG tablet TAKE 1 TABLET BY MOUTH ONCE DAILY BEFORE BREAKFAST (Patient taking differently: Take 50 mcg by mouth daily before breakfast.) 30 tablet 10   furosemide (LASIX) 20 MG tablet Take 1 tablet (20 mg total) by mouth daily. 90 tablet 2   GNP CRANBERRY EXTRACT PO Take 500 mg by mouth in the morning.     loratadine (CLARITIN) 10 MG tablet Take 10 mg by mouth daily.     olopatadine (PATADAY) 0.1 % ophthalmic solution Place 1 drop into both eyes 2 (two) times daily. 5 mL 2   ONE TOUCH ULTRA TEST test strip USE UP TO FOUR TIMES DAILY AS DIRECTED (Patient taking  differently: 1 each by Other route 4 (four) times daily.) 400 each 3   oxybutynin (DITROPAN-XL) 10 MG 24 hr tablet TAKE 1 TABLET BY MOUTH AT  BEDTIME 90 tablet 0   pantoprazole (PROTONIX) 40 MG tablet TAKE 1 TABLET BY MOUTH  TWICE DAILY BEFORE A MEAL 180 tablet 0   vitamin B-12 1000 MCG tablet Take 1 tablet (1,000 mcg total) by mouth daily. 30 tablet 2   amLODipine (NORVASC) 10 MG tablet Take 1 tablet (10 mg total) by mouth daily. 90 tablet 3   ferrous sulfate 325 (65 FE) MG EC tablet Take 1 tablet (325 mg total) by mouth 2 (two) times daily. 60 tablet 2   No current facility-administered medications for this visit.    Allergies:   Patient has no known allergies.    ROS:  Please see the history of present illness.   Otherwise, review of systems are positive  for none.   All other systems are reviewed and negative.    PHYSICAL EXAM: VS:  BP 130/60 (BP Location: Right Arm, Patient Position: Sitting)    Pulse (!) 47    Ht 5\' 3"  (1.6 m)    Wt 199 lb 3.2 oz (90.4 kg)    SpO2 99%    BMI 35.29 kg/m  , BMI Body mass index is 35.29 kg/m. GENERAL:  Well appearing NECK:  No jugular venous distention, waveform within normal limits, carotid upstroke brisk and symmetric, no bruits, no thyromegaly LUNGS:  Clear to auscultation bilaterally CHEST:  Unremarkable HEART:  PMI not displaced or sustained,S1 and S2 within normal limits, no S3, no S4, no clicks, no rubs, no murmurs ABD:  Flat, positive bowel sounds normal in frequency in pitch, no bruits, no rebound, no guarding, no midline pulsatile mass, no hepatomegaly, no splenomegaly EXT:  2 plus pulses throughout, no edema, no cyanosis no clubbing   EKG:  EKG is  ordered today. The ekg ordered today demonstrates sinus bradycardia, rate 47, premature atrial contractions, short PR interval, no acute ST-T wave changes.   Recent Labs: 02/09/2021: Magnesium 1.8 07/30/2021: ALT 23; BUN 28; Creatinine, Ser 1.19; Hemoglobin 13.4; Platelets 257; Potassium 4.7;  Sodium 144; TSH 2.530    Lipid Panel    Component Value Date/Time   CHOL 179 07/30/2021 1306   TRIG 137 07/30/2021 1306   HDL 58 07/30/2021 1306   CHOLHDL 3.1 07/30/2021 1306   CHOLHDL 3.7 02/07/2021 0528   VLDL 13 02/07/2021 0528   LDLCALC 97 07/30/2021 1306      Wt Readings from Last 3 Encounters:  08/03/21 199 lb 3.2 oz (90.4 kg)  07/30/21 198 lb 6.4 oz (90 kg)  07/06/21 197 lb (89.4 kg)      Other studies Reviewed: Additional studies/ records that were reviewed today include: TEE Review of the above records demonstrates:  Please see elsewhere in the note.     ASSESSMENT AND PLAN:  Moderate mitral valve regurgitation with moderate mitral annular calcification:    This was moderate and can be followed clinically.  I will repeat an echo in September of next year.   History of atrial fibrillation with RVR: She is maintaining sinus rhythm.  Continue meds as listed.   Of note right now she is at the right age and creatinine to be on the 5 mg twice daily dose but this needs to be watched closely going forward particularly if she turns 81.  History of systolic heart failure: Her ejection fraction had improved.   On the TEE it was low normal.    HTN:  She increased her Norvasc on her own to 10 mg and I agree with this because her blood pressure was running high.  I will write this prescription.  We are avoiding ARB's and ARNI because of elevated creatinine.  She cannot tolerate beta-blocker.  Continue current meds as listed.   Current medicines are reviewed at length with the patient today.  The patient does not have concerns regarding medicines.  The following changes have been made:  None  Labs/ tests ordered today include:  None   Orders Placed This Encounter  Procedures   EKG 12-Lead   ECHOCARDIOGRAM COMPLETE      Disposition:   FU with me Sept.  .     Signed, Minus Breeding, MD  08/03/2021 2:43 PM    [place]

## 2021-08-03 ENCOUNTER — Ambulatory Visit: Payer: PPO | Admitting: Cardiology

## 2021-08-03 ENCOUNTER — Encounter: Payer: Self-pay | Admitting: Cardiology

## 2021-08-03 ENCOUNTER — Other Ambulatory Visit: Payer: Self-pay

## 2021-08-03 VITALS — BP 130/60 | HR 47 | Ht 63.0 in | Wt 199.2 lb

## 2021-08-03 DIAGNOSIS — Q233 Congenital mitral insufficiency: Secondary | ICD-10-CM

## 2021-08-03 DIAGNOSIS — I10 Essential (primary) hypertension: Secondary | ICD-10-CM | POA: Diagnosis not present

## 2021-08-03 DIAGNOSIS — I4891 Unspecified atrial fibrillation: Secondary | ICD-10-CM | POA: Diagnosis not present

## 2021-08-03 DIAGNOSIS — R42 Dizziness and giddiness: Secondary | ICD-10-CM

## 2021-08-03 MED ORDER — AMLODIPINE BESYLATE 10 MG PO TABS
10.0000 mg | ORAL_TABLET | Freq: Every day | ORAL | 3 refills | Status: DC
Start: 2021-08-03 — End: 2022-02-12

## 2021-08-03 NOTE — Patient Instructions (Signed)
Medication Instructions:  The current medical regimen is effective;  continue present plan and medications.  *If you need a refill on your cardiac medications before your next appointment, please call your pharmacy*   Testing/Procedures: Echocardiogram (September) - Your physician has requested that you have an echocardiogram. Echocardiography is a painless test that uses sound waves to create images of your heart. It provides your doctor with information about the size and shape of your heart and how well your hearts chambers and valves are working. This procedure takes approximately one hour. There are no restrictions for this procedure. This will be performed at either our [facility] location - [address], [place] location [facility] 2nd floor.    Follow-Up: At [facility], you and your health needs are our priority.  As part of our continuing mission to provide you with exceptional heart care, we have created designated Provider Care Teams.  These Care Teams include your primary Cardiologist (physician) and Advanced Practice Providers (APPs -  Physician Assistants and Nurse Practitioners) who all work together to provide you with the care you need, when you need it.  We recommend signing up for the patient portal called "MyChart".  Sign up information is provided on this After Visit Summary.  MyChart is used to connect with patients for Virtual Visits (Telemedicine).  Patients are able to view lab/test results, encounter notes, upcoming appointments, etc.  Non-urgent messages can be sent to your provider as well.   To learn more about what you can do with MyChart, go to [email or web address].    Your next appointment:   September 2023  The format for your next appointment:   In Person  Provider:   Minus Breeding, MD

## 2021-08-17 ENCOUNTER — Ambulatory Visit (INDEPENDENT_AMBULATORY_CARE_PROVIDER_SITE_OTHER): Payer: PPO | Admitting: *Deleted

## 2021-08-17 DIAGNOSIS — E119 Type 2 diabetes mellitus without complications: Secondary | ICD-10-CM

## 2021-08-17 DIAGNOSIS — I10 Essential (primary) hypertension: Secondary | ICD-10-CM

## 2021-08-17 DIAGNOSIS — I482 Chronic atrial fibrillation, unspecified: Secondary | ICD-10-CM

## 2021-08-17 DIAGNOSIS — R7989 Other specified abnormal findings of blood chemistry: Secondary | ICD-10-CM

## 2021-08-17 NOTE — Patient Instructions (Signed)
Visit Information   Patient Goals/Self-Care Activities: Patient will self administer medications as prescribed Patient will attend all scheduled provider appointments Patient will continue to perform ADL's independently Patient will call provider office for new concerns or questions Patient will take medication as directed. Call PCP or seek appropriate medical attention as needed for new or worsening symptoms Patient will seek appropriate medical attention for any s/s of bleeding or anemia Patient will monitor heart rate and call cardiologist with any readings outside of recommended range Patient will check and record blood pressure daily and call PCP or cardio with any readings outside of recommended range Patient will check and record blood sugar daily and call PCP with any readings outside of recommended range Patient will have repeated CBCs at PCP office to follow hemoglobin trend Patient will call RN Care Manager as needed [phone] Patient will talk with CCM team about prescription assistance needs Patient will talk with [facility] or [facility] referral coordinator regarding endocrinology referral  Patient verbalizes understanding of instructions and care plan provided today and agrees to view in [place]. Active MyChart status confirmed with patient.    Plan:Telephone follow up appointment with care management team member scheduled for:  08/24/21 with RNCM The patient has been provided with contact information for the care management team and has been advised to call with any health related questions or concerns.   Chong Sicilian, BSN, RN-BC Embedded Chronic Care Manager [facility] [place] [facility] Direct Dial: [phone]

## 2021-08-17 NOTE — Chronic Care Management (AMB) (Signed)
Chronic Care Management   CCM RN Visit Note  08/17/2021 Name: [name] [name] MRN: [id number] DOB: 1940/09/02  Subjective: [name] [name] is a 81 y.o. year old female who is a primary care patient of Loman Brooklyn, FNP. The care management team was consulted for assistance with disease management and care coordination needs.    Engaged with patient by telephone for follow up visit in response to provider referral for case management and/or care coordination services.   Consent to Services:  The patient was given information about Chronic Care Management services, agreed to services, and gave verbal consent prior to initiation of services.  Please see initial visit note for detailed documentation.   Patient agreed to services and verbal consent obtained.   Assessment: Review of patient past medical history, allergies, medications, health status, including review of consultants reports, laboratory and other test data, was performed as part of comprehensive evaluation and provision of chronic care management services.   SDOH (Social Determinants of Health) assessments and interventions performed:    CCM Care Plan  No Known Allergies  Outpatient Encounter Medications as of 08/17/2021  Medication Sig   acetaminophen (TYLENOL) 325 MG tablet Take 2 tablets (650 mg total) by mouth every 6 (six) hours as needed for mild pain or headache (or Fever >/= 101).   allopurinol (ZYLOPRIM) 100 MG tablet Take 1 tablet (100 mg total) by mouth daily.   amLODipine (NORVASC) 10 MG tablet Take 1 tablet (10 mg total) by mouth daily.   apixaban (ELIQUIS) 5 MG TABS tablet Take 1 tablet (5 mg total) by mouth 2 (two) times daily.   atorvastatin (LIPITOR) 20 MG tablet Take 1 tablet (20 mg total) by mouth daily.   Cholecalciferol (VITAMIN D-3) 125 MCG (5000 UT) TABS Take 5,000 Units by mouth daily with breakfast.   dapagliflozin propanediol (FARXIGA) 10 MG TABS tablet Take 5 mg (1/2 tablet) by mouth once  daily x1 week, then increase to 10 mg daily.   diclofenac Sodium (VOLTAREN) 1 % GEL APPLY 4 GRAMS TOPICALLY 4  TIMES DAILY (Patient taking differently: 2 g 2 (two) times daily.)   EUTHYROX 50 MCG tablet TAKE 1 TABLET BY MOUTH ONCE DAILY BEFORE BREAKFAST (Patient taking differently: Take 50 mcg by mouth daily before breakfast.)   ferrous sulfate 325 (65 FE) MG EC tablet Take 1 tablet (325 mg total) by mouth 2 (two) times daily.   furosemide (LASIX) 20 MG tablet Take 1 tablet (20 mg total) by mouth daily.   GNP CRANBERRY EXTRACT PO Take 500 mg by mouth in the morning.   loratadine (CLARITIN) 10 MG tablet Take 10 mg by mouth daily.   olopatadine (PATADAY) 0.1 % ophthalmic solution Place 1 drop into both eyes 2 (two) times daily.   ONE TOUCH ULTRA TEST test strip USE UP TO FOUR TIMES DAILY AS DIRECTED (Patient taking differently: 1 each by Other route 4 (four) times daily.)   oxybutynin (DITROPAN-XL) 10 MG 24 hr tablet TAKE 1 TABLET BY MOUTH AT  BEDTIME   pantoprazole (PROTONIX) 40 MG tablet TAKE 1 TABLET BY MOUTH  TWICE DAILY BEFORE A MEAL   No facility-administered encounter medications on file as of 08/17/2021.    Patient Active Problem List   Diagnosis Date Noted   MR (congenital mitral regurgitation) 08/01/2021   Dizziness 08/01/2021   Stage 3a chronic kidney disease ([place]) 07/30/2021   Nonrheumatic tricuspid valve regurgitation 02/24/2021   Nonrheumatic mitral valve regurgitation    Severe systolic congestive heart failure (  [place]) 02/07/2021   Chronic a-fib ([place]) 02/04/2021   Iron deficiency anemia due to chronic blood loss    Sciatica of right side 10/18/2018   Seborrheic keratosis 10/17/2016   Dyslipidemia 10/17/2016   Overactive bladder 10/17/2016   Gastroesophageal reflux disease without esophagitis 10/17/2016   Primary osteoarthritis involving multiple joints 10/17/2016   Controlled gout 10/17/2016   Essential hypertension 05/16/2016   Hypothyroidism 05/16/2016   Morbid obesity  ([place]) 10/30/2013   S/P right THA, AA 10/29/2013    Conditions to be addressed/monitored:Atrial Fibrillation, HTN, HLD, DMII, and Elev PTH and calcium  Care Plan : [facility] Care Plan  Updates made by Ilean China, RN since 08/17/2021 12:00 AM     Problem: Chronic Disease Management Needs (DM, HTN, HLD, Afib, chronic anticoagulation)   Priority: High     Long-Range Goal: Work with [place] Coordination Regarding Chronic Medical Conditions   Start Date: 02/02/2021  This Visit's Progress: On track  Recent Progress: On track  Priority: High  Note:   Current Barriers:  Chronic Disease Management support and education needs related to Atrial Fibrillation, HTN, HLD, and DMII  RNCM Clinical Goal(s):  Patient will continue to work with RN Care Manager and/or Social Worker to address care management and care coordination needs related to Atrial Fibrillation, HTN, HLD, and DMII as evidenced by adherence to CM Team Scheduled appointments     through collaboration with RN Care manager, provider, and care team.   Interventions: 1:1 collaboration with Loman Brooklyn, FNP regarding development and update of comprehensive plan of care as evidenced by provider attestation and co-signature  Inter-disciplinary care team collaboration (see longitudinal plan of care) Reviewed chart including relevant office notes, lab results, and consultation reports Provided with RN Care Manager contact number and encouraged to reach out as needed   Diabetes:  (Status: Goal on Track (progressing): YES.) Lab Results  Component Value Date   HGBA1C 5.1 05/02/2021   HGBA1C 5.7 (H) 12/25/2020   HGBA1C 5.7 10/20/2020   Lab Results  Component Value Date   [place] 97 07/30/2021   CREATININE 1.19 (H) 07/30/2021  Assessed patient's understanding of A1c goal: <7% Reviewed and discussed most recent A1C Reviewed medications with patient and discussed importance of medication adherence; Counseled on  importance of regular laboratory monitoring as prescribed; Discussed plans with patient for ongoing care management follow up and provided patient with direct contact information for care management team; Reviewed scheduled/upcoming provider appointments including:  ; Encouraged to continue to follow an ADA/carb modified diet Discussed addition of Farxiga. Discussed cost. Affordable now at $90 for a 90 day supply from mail order. Understands that she will go into the doughnut hole quicker because of this and Eliquis. Can apply for assistance when needed.    Hypertension: (Status: Goal on Track (progressing): YES.) Last practice recorded BP readings:  BP Readings from Last 3 Encounters:  08/03/21 130/60  07/30/21 (!) 171/86  07/06/21 (!) 176/52   Evaluation of current treatment plan related to hypertension self management and patient's adherence to plan as established by provider;   Reviewed medications with patient and discussed importance of compliance;  Discussed plans with patient for ongoing care management follow up and provided patient with direct contact information for care management team; Advised patient, providing education and rationale, to monitor blood pressure daily and record, calling PCP for findings outside established parameters;  Reviewed and discussed recent cardiology and PCP visits Discussed increase in amlodipine from 97m to 154mdaily.  Patient is compliant and blood pressure has improved. Discussed home blood pressure averaging 130/80 Discussed heart rate since stopping beta blocker. Ranges in the 52s and 50s. Feels well. No dizziness or fatigue.    HDL:  (Status: Goal on Track (progressing): YES.) Lab Results  Component Value Date   CHOL 179 07/30/2021   HDL 58 07/30/2021   LDLCALC 97 07/30/2021   TRIG 137 07/30/2021   CHOLHDL 3.1 07/30/2021  Evaluation of current treatment plan related to HLD self-management and patient's adherence to plan as established by  provider. Discussed plans with patient for ongoing care management follow up and provided patient with direct contact information for care management team Reviewed and discussed recent lab results Reviewed and discussed medications and compliance Encouraged patient to continue increasing physical activity level as tolerated   A-fib:  (Status: Goal on track: YES.) Afib action plan reviewed Assessed social determinant of health barriers Discussed prescription assistance approval for Eliquis. Approved until 07/24/21. Reviewed eligibility requirements for 2023. Patient has enough samples and current prescription to last for a few months.  Advised patient to continue to monitor heart rate and symptoms and to reach out to cardio or seek emergency medical attention if needed Discussed heart rate monitoring. Still ranging in the 40s and 50s. Feels well. No dizziness or fatigue.  Encouraged to reach out to [facility] as needed   Elevated Calcium & PTH:  (Status: New goal.) Long Term Goal  Reviewed and discussed recent lab results and office notes Discussed history of elevated PTH and calcium in the 90s and parathyroid biopsy at that timed Discussed endocrinology referral Collaborated with [facility] Referral coordinator regarding referral from 10/22. Appt has not been scheduled. [facility] has a long waiting list. Will see if there are other options.    Anemia/Bleeding:  (Status: Goal Met.)  Lab Results  Component Value Date   WBC 6.7 07/30/2021   HGB 13.4 07/30/2021   HCT 40.9 07/30/2021   MCV 93 07/30/2021   PLT 257 07/30/2021  Reviewed and discussed recent lab results and provider recommendations Advised that she cans top iron supplement since hgb is normal Marked iron for removal on medication list Encouraged to reach out to PCP or GI or seek emergency medical attention if necessary for any GI bleeding Reviewed upcoming appointments   Acute Eyelid Problem:  (Status: Goal Met.) Talked with patient  by telephone regarding bilateral eyelid irritation, itching, and swelling. First noticed it 2 days ago. Has not used any different medications, eye drops, facial products, etc. Has used systane eye drops as usual. Advised that some eye drops have preservative or added ingredients that she may be allergic to.  Does not have any eyeball symptoms. Eyelid symptoms are improving at this point Discussed possibly of blepharitis. Denies any dryness or flaking.  Encouraged to continue daily Claritin. Can take a benadryl at night if needed. Advised that it may make her drowsy. Clean with gentle cleanser and pat dry. Can put a thin layer of Vaseline on eyelid to see if that will help with irritation Advised to see a provider if symptoms get worse or don't improve  Patient Goals/Self-Care Activities: Patient will self administer medications as prescribed Patient will attend all scheduled provider appointments Patient will continue to perform ADL's independently Patient will call provider office for new concerns or questions Patient will take medication as directed. Call PCP or seek appropriate medical attention as needed for new or worsening symptoms Patient will seek appropriate medical attention for any s/s of  bleeding or anemia Patient will monitor heart rate and call cardiologist with any readings outside of recommended range Patient will check and record blood pressure daily and call PCP or cardio with any readings outside of recommended range Patient will check and record blood sugar daily and call PCP with any readings outside of recommended range Patient will have repeated CBCs at PCP office to follow hemoglobin trend Patient will call RN Care Manager as needed [phone] Patient will talk with CCM team about prescription assistance needs Patient will talk with [facility] or [facility] referral coordinator regarding endocrinology referral  Plan:Telephone follow up appointment with care management team member  scheduled for:  08/24/21 with RNCM The patient has been provided with contact information for the care management team and has been advised to call with any health related questions or concerns.   Chong Sicilian, BSN, RN-BC Embedded Chronic Care Manager [facility] [place] [facility] Direct Dial: [phone]

## 2021-08-22 ENCOUNTER — Other Ambulatory Visit: Payer: Self-pay | Admitting: Family Medicine

## 2021-08-22 DIAGNOSIS — E039 Hypothyroidism, unspecified: Secondary | ICD-10-CM

## 2021-08-24 ENCOUNTER — Telehealth: Payer: PPO | Admitting: *Deleted

## 2021-08-24 DIAGNOSIS — E119 Type 2 diabetes mellitus without complications: Secondary | ICD-10-CM

## 2021-08-24 DIAGNOSIS — I482 Chronic atrial fibrillation, unspecified: Secondary | ICD-10-CM | POA: Diagnosis not present

## 2021-08-24 DIAGNOSIS — I10 Essential (primary) hypertension: Secondary | ICD-10-CM

## 2021-08-29 ENCOUNTER — Other Ambulatory Visit: Payer: Self-pay | Admitting: Family Medicine

## 2021-08-30 NOTE — Telephone Encounter (Signed)
[name] [name]. NTBS in March for 3 mos ckup. Mail order sent

## 2021-08-30 NOTE — Telephone Encounter (Signed)
Made appt for 10/28/21 for med refill

## 2021-09-27 ENCOUNTER — Encounter: Payer: Self-pay | Admitting: Family Medicine

## 2021-09-28 ENCOUNTER — Other Ambulatory Visit: Payer: Self-pay | Admitting: *Deleted

## 2021-09-28 DIAGNOSIS — M1A9XX0 Chronic gout, unspecified, without tophus (tophi): Secondary | ICD-10-CM

## 2021-09-28 MED ORDER — PANTOPRAZOLE SODIUM 40 MG PO TBEC: 40.0000 mg | DELAYED_RELEASE_TABLET | Freq: Two times a day (BID) | ORAL | 1 refills | Status: DC

## 2021-09-28 MED ORDER — OXYBUTYNIN CHLORIDE ER 10 MG PO TB24: 10.0000 mg | ORAL_TABLET | Freq: Every day | ORAL | 1 refills | Status: DC

## 2021-09-28 MED ORDER — ALLOPURINOL 100 MG PO TABS: 100.0000 mg | ORAL_TABLET | Freq: Every day | ORAL | 1 refills | Status: DC

## 2021-10-01 ENCOUNTER — Other Ambulatory Visit: Payer: Self-pay | Admitting: Family Medicine

## 2021-10-04 ENCOUNTER — Telehealth: Payer: Self-pay

## 2021-10-04 ENCOUNTER — Ambulatory Visit (INDEPENDENT_AMBULATORY_CARE_PROVIDER_SITE_OTHER): Payer: PPO | Admitting: Internal Medicine

## 2021-10-04 ENCOUNTER — Encounter: Payer: Self-pay | Admitting: Internal Medicine

## 2021-10-04 ENCOUNTER — Other Ambulatory Visit: Payer: Self-pay

## 2021-10-04 VITALS — BP 126/70 | HR 68 | Ht 63.0 in | Wt 201.0 lb

## 2021-10-04 DIAGNOSIS — E210 Primary hyperparathyroidism: Secondary | ICD-10-CM | POA: Diagnosis not present

## 2021-10-04 LAB — BASIC METABOLIC PANEL
BUN: 27 mg/dL — ABNORMAL HIGH (ref 6–23)
CO2: 25 mEq/L (ref 19–32)
Calcium: 11.6 mg/dL — ABNORMAL HIGH (ref 8.4–10.5)
Chloride: 107 mEq/L (ref 96–112)
Creatinine, Ser: 1.34 mg/dL — ABNORMAL HIGH (ref 0.40–1.20)
GFR: 37.55 mL/min — ABNORMAL LOW (ref >60.00)
Glucose, Bld: 97 mg/dL (ref 70–99)
Potassium: 4.5 mEq/L (ref 3.5–5.1)
Sodium: 142 mEq/L (ref 135–145)

## 2021-10-04 LAB — VITAMIN D 25 HYDROXY (VIT D DEFICIENCY, FRACTURES): VITD: 70.01 ng/mL (ref 30.00–100.00)

## 2021-10-04 LAB — ALBUMIN: Albumin: 4.6 g/dL (ref 3.5–5.2)

## 2021-10-04 NOTE — Patient Instructions (Addendum)
Stay Hydrated  Avoid over the counter calcium tablets Consume 2-3 servings of dietary calcium daily      24-Hour Urine Collection  You will be collecting your urine for a 24-hour period of time. Your timer starts with your first urine of the morning (For example - If you first pee at 9AM, your timer will start at 9AM) Throw away your first urine of the morning Collect your urine every time you pee for the next 24 hours STOP your urine collection 24 hours after you started the collection (For example - You would stop at 9AM the day after you started)  

## 2021-10-04 NOTE — Telephone Encounter (Signed)
Received a denial of oxybutynin chloride ER 22m ? ?We denied this request under Medicare Part D bc tiering  exception rules not met. Medication is not eligible for a tier exception. The medication requested currently resides at Tier 2 and cannot be approved for a tier exception bc it is already at the lowest possible tier.  ? ?

## 2021-10-04 NOTE — Progress Notes (Unsigned)
Name: [name] [name]  MRN/ DOB: [id number], Feb 17, 1941    Age/ Sex: 81 y.o., female    PCP: Loman Brooklyn, FNP   Reason for Endocrinology Evaluation: Elevated PTH     Date of Initial Endocrinology Evaluation: 10/04/2021     HPI: [name] is a 81 y.o. female with a past medical history of Dyslipidemia, Hypothyroidism . The patient presented for initial endocrinology clinic visit on 10/04/2021 for consultative assistance with her Elevated PTH.   Pt has been noted with elevated PTH during routine labs 05/10/2021 at 103 pg/mL .   Pt has been noted  with hypercalcemia intermittently since 2012 with a max level of 11.9 mg/dL (uncorrected ) 09/2020.   She has experienced symptoms of constipation, polyuria. She denies use of over the counter calcium (including supplements, Tums, Rolaids, or other calcium containing antacids), lithium, HCTZ but she is on Furosemide   She is on  vitamin D 5000 iu daily   She does have history of kidney stones ( 12/2020) , CKD III but no  liver disease, granulomatous disease. She denies osteoporosis (DXA 08/2017 with a T-score of -2.4 at  distal 1/3rd forearm) , nor prior fractures. Daily dietary calcium intake: 1-2 servings. She denies family history of osteoporosis,   Son with  parathyroid disease Mother with questionable thyroid disease.   She is S/P right inferior parathyroidectomy 1999   HISTORY:  Past Medical History:  Past Medical History:  Diagnosis Date   Anemia    Arthritis    Ostearthritis- hips, knees, fingers   DVT (deep venous thrombosis) (HCC)    Dyspnea    GERD (gastroesophageal reflux disease)    Headache(784.0)    tx. Valproic acid   History of diabetes mellitus    History of kidney stones    Hypertension    Hypothyroidism    Pulmonary embolism [facility])    Past Surgical History:  Past Surgical History:  Procedure Laterality Date   ABDOMINAL HYSTERECTOMY     BILATERAL HIP ARTHROSCOPY Left    BIOPSY   12/08/2019   Procedure: BIOPSY;  Surgeon: Daneil Dolin, MD;  [facility]: [facility];  Service: Endoscopy;;   CATARACT EXTRACTION, BILATERAL     COLONOSCOPY N/A 12/08/2019   polyps (tubular adenoma), diverticulosis, colonic lipoma, no surveillance due to age   75 W/ [place] Right 12/25/2020   Procedure: CYSTOSCOPY WITH RETROGRADE PYELOGRAM/URETERAL STENT PLACEMENT;  Surgeon: Janith Lima, MD;  Location: [facility] OR[facility];  Service: Urology;  Laterality: Right;   CYSTOSCOPY/URETEROSCOPY/HOLMIUM LASER/STENT PLACEMENT Right 01/15/2021   Procedure: CYSTOSCOPY/RETROGRADE/URETEROSCOPY/HOLMIUM LASER/STENT EXCHANGE;  Surgeon: Janith Lima, MD;  Location: [facility];  Service: Urology;  Laterality: Right;   ESOPHAGOGASTRODUODENOSCOPY N/A 12/08/2019   normal esophagus with 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   ESOPHAGOGASTRODUODENOSCOPY (EGD) WITH PROPOFOL N/A 05/18/2021   Procedure: ESOPHAGOGASTRODUODENOSCOPY (EGD) WITH PROPOFOL;  Surgeon: Clarene Essex, MD;  [facility]: [facility];  Service: Endoscopy;  Laterality: N/A;  RFA; APC; ultraslim scope   GIVENS CAPSULE STUDY N/A 01/13/2020   Procedure: GIVENS CAPSULE STUDY;  Surgeon: Daneil Dolin, MD;  [facility];  Service: Endoscopy;  Laterality: N/A;  7:30am   GIVENS CAPSULE STUDY N/A 05/04/2021   Procedure: GIVENS CAPSULE STUDY;  Surgeon: Clarene Essex, MD;  [facility];  Service: Endoscopy;  Laterality: N/A;   MULTIPLE TOOTH EXTRACTIONS     60's   PARATHYROIDECTOMY     POLYPECTOMY  12/08/2019   Procedure:  POLYPECTOMY;  Surgeon: Daneil Dolin, MD;  [facility];  Service: Endoscopy;;   RADIOFREQUENCY ABLATION  05/18/2021   Procedure: RADIO FREQUENCY ABLATION;  Surgeon: Clarene Essex, MD;  [facility];  Service: Endoscopy;;   TEE WITHOUT CARDIOVERSION N/A 07/06/2021   Procedure: TRANSESOPHAGEAL ECHOCARDIOGRAM (TEE);  Surgeon: Donato Heinz, MD;   Location: [facility] ENDOSCOPY;  Service: Cardiovascular;  Laterality: N/A;   TOTAL HIP ARTHROPLASTY Right 10/29/2013   Procedure: RIGHT TOTAL HIP ARTHROPLASTY ANTERIOR APPROACH;  Surgeon: Mauri Pole, MD;  Location: [facility];  Service: Orthopedics;  Laterality: Right;    Social History:  reports that she has never smoked. She has never used smokeless tobacco. She reports that she does not drink alcohol and does not use drugs. Family History: family history includes Cancer (age of onset: 10) in her sister; Cancer (age of onset: 47) in her mother; Colitis (age of onset: 85) in her sister; Healthy in her daughter, son, son, and son; Heart attack (age of onset: 54) in her brother; Heart disease in her brother; Heart disease (age of onset: 3) in her father; Pulmonary embolism (age of onset: 31) in her brother.   HOME MEDICATIONS: Allergies as of 10/04/2021   No Known Allergies      Medication List        Accurate as of October 04, 2021  7:22 AM. If you have any questions, ask your nurse or doctor.          acetaminophen 325 MG tablet Commonly known as: TYLENOL Take 2 tablets (650 mg total) by mouth every 6 (six) hours as needed for mild pain or headache (or Fever >/= 101).   allopurinol 100 MG tablet Commonly known as: ZYLOPRIM Take 1 tablet (100 mg total) by mouth daily.   amLODipine 10 MG tablet Commonly known as: NORVASC Take 1 tablet (10 mg total) by mouth daily.   apixaban 5 MG Tabs tablet Commonly known as: ELIQUIS Take 1 tablet (5 mg total) by mouth 2 (two) times daily.   atorvastatin 20 MG tablet Commonly known as: LIPITOR Take 1 tablet by mouth once daily   dapagliflozin propanediol 10 MG Tabs tablet Commonly known as: Farxiga Take 5 mg (1/2 tablet) by mouth once daily x1 week, then increase to 10 mg daily.   diclofenac Sodium 1 % Gel Commonly known as: VOLTAREN APPLY 4 GRAMS TOPICALLY 4  TIMES DAILY What changed: See the new instructions.   ferrous sulfate 325 (65  FE) MG EC tablet Take 1 tablet (325 mg total) by mouth 2 (two) times daily.   furosemide 20 MG tablet Commonly known as: Lasix Take 1 tablet (20 mg total) by mouth daily.   GNP CRANBERRY EXTRACT PO Take 500 mg by mouth in the morning.   levothyroxine 50 MCG tablet Commonly known as: SYNTHROID TAKE 1 TABLET BY MOUTH ONCE DAILY BEFORE BREAKFAST   loratadine 10 MG tablet Commonly known as: CLARITIN Take 10 mg by mouth daily.   olopatadine 0.1 % ophthalmic solution Commonly known as: [place] Place 1 drop into both eyes 2 (two) times daily.   ONE TOUCH ULTRA TEST test strip Generic drug: glucose blood USE UP TO FOUR TIMES DAILY AS DIRECTED What changed: See the new instructions.   oxybutynin 10 MG 24 hr tablet Commonly known as: DITROPAN-XL Take 1 tablet (10 mg total) by mouth at bedtime.   pantoprazole 40 MG tablet Commonly known as: PROTONIX Take 1 tablet (40 mg total) by mouth 2 (two) times  daily before a meal.   Vitamin D-3 125 MCG (5000 UT) Tabs Take 5,000 Units by mouth daily with breakfast.          REVIEW OF SYSTEMS: A comprehensive ROS was conducted with the patient and is negative except as per HPI and below:  ROS     OBJECTIVE:  VS: There were no vitals taken for this visit.   Wt Readings from Last 3 Encounters:  08/03/21 199 lb 3.2 oz (90.4 kg)  07/30/21 198 lb 6.4 oz (90 kg)  07/06/21 197 lb (89.4 kg)     EXAM: General: Pt appears well and is in NAD  Hydration: Well-hydrated with moist mucous membranes and good skin turgor  Eyes: External eye exam normal without stare, lid lag or exophthalmos.  EOM intact.  PERRL.  Ears, Nose, Throat: Hearing: Grossly intact bilaterally Dental: Good dentition  Throat: Clear without mass, erythema or exudate  Neck: General: Supple without adenopathy. Thyroid: Thyroid size normal.  No goiter or nodules appreciated. No thyroid bruit.  Lungs: Clear with good BS bilat with no rales, rhonchi, or wheezes  Heart:  Auscultation: RRR.  Abdomen: Normoactive bowel sounds, soft, nontender, without masses or organomegaly palpable  Extremities: Gait and station: Normal gait  Digits and nails: No clubbing, cyanosis, petechiae, or nodes Head and neck: Normal alignment and mobility BL UE: Normal ROM and strength. BL LE: No pretibial edema normal ROM and strength.  Skin: Hair: Texture and amount normal with gender appropriate distribution Skin Inspection: No rashes, acanthosis nigricans/skin tags. No lipohypertrophy Skin Palpation: Skin temperature, texture, and thickness normal to palpation  Neuro: Cranial nerves: II - XII grossly intact  Cerebellar: Normal coordination and movement; no tremor Motor: Normal strength throughout DTRs: 2+ and symmetric in UE without delay in relaxation phase  Mental Status: Judgment, insight: Intact Orientation: Oriented to time, place, and person Memory: Intact for recent and remote events Mood and affect: No depression, anxiety, or agitation     DATA REVIEWED: ***    ASSESSMENT/PLAN/RECOMMENDATIONS:   ***    Medications :  Signed electronically by: Mack Guise, MD  [facility]  [facility] [address] [place], [place] [address] Phone: [phone] FAX: [phone]   CC: Loman Brooklyn, [place] [address] Phone: [phone] Fax: [phone]   Return to Endocrinology clinic as below: Future Appointments  Date Time Provider [place]  10/04/2021  9:50 AM Natalea Sutliff, Melanie Crazier, MD [facility] None  10/28/2021  8:05 AM Loman Brooklyn, FNP [facility] None  12/23/2021 11:15 AM [facility]-ANNUAL WELLNESS VISIT [facility] None  03/29/2022 10:00 AM [facility]-ECHO/VAS [facility]

## 2021-10-05 LAB — PARATHYROID HORMONE, INTACT (NO CA): PTH: 118 pg/mL — ABNORMAL HIGH (ref 16–77)

## 2021-10-05 NOTE — Telephone Encounter (Signed)
This drug is already at its lowest cost and is generic. Is the patient having trouble affording this? ?

## 2021-10-05 NOTE — Telephone Encounter (Signed)
I spoke to pt and she says she has spoken with the pharmacy and they have already shipped her medication so she is able to get it. ?

## 2021-10-06 ENCOUNTER — Other Ambulatory Visit: Payer: Self-pay

## 2021-10-06 ENCOUNTER — Other Ambulatory Visit: Payer: PPO

## 2021-10-06 DIAGNOSIS — E210 Primary hyperparathyroidism: Secondary | ICD-10-CM

## 2021-10-06 NOTE — Progress Notes (Unsigned)
Total volume 2,800.  Started 10-05-2021 at 7:00 am, ended 10-06-2021 at 7:00 am. ?

## 2021-10-07 ENCOUNTER — Telehealth: Payer: Self-pay | Admitting: Internal Medicine

## 2021-10-07 LAB — CREATININE, URINE, 24 HOUR: Creatinine, 24H Ur: 1.06 g/(24.h) (ref 0.50–2.15)

## 2021-10-07 LAB — CALCIUM, URINE, 24 HOUR: Calcium, 24H Urine: 255 mg/24 h — ABNORMAL HIGH

## 2021-10-07 MED ORDER — HYDROCHLOROTHIAZIDE 12.5 MG PO TABS: 12.5000 mg | ORAL_TABLET | Freq: Every day | ORAL | 3 refills | Status: DC

## 2021-10-07 NOTE — Telephone Encounter (Signed)
Discussed 24-hour urine excretion of calcium with the patient on 10/07/2021 ? ? ? ? ?There is slight elevation in calcium excretion, she is on furosemide, I have recommended HCTZ in addition to furosemide to improve hypercalciuria ? ? ? ?Patient in agreement ?Medication ?Start HCTZ 12.5 mg daily ? ? ?Mack Guise, MD ? ?[facility]  ?[facility] ?[address] ?[place], [place] [address] ?Phone: [phone] ?FAX: [phone] ? ?

## 2021-10-23 DIAGNOSIS — C44612 Basal cell carcinoma of skin of right upper limb, including shoulder: Secondary | ICD-10-CM

## 2021-10-23 HISTORY — DX: Basal cell carcinoma of skin of right upper limb, including shoulder: C44.612

## 2021-10-28 ENCOUNTER — Encounter: Payer: Self-pay | Admitting: Family Medicine

## 2021-10-28 ENCOUNTER — Ambulatory Visit (INDEPENDENT_AMBULATORY_CARE_PROVIDER_SITE_OTHER): Payer: PPO | Admitting: Family Medicine

## 2021-10-28 ENCOUNTER — Ambulatory Visit (INDEPENDENT_AMBULATORY_CARE_PROVIDER_SITE_OTHER): Payer: PPO

## 2021-10-28 VITALS — BP 132/62 | HR 56 | Temp 97.7°F | Ht 63.0 in | Wt 203.8 lb

## 2021-10-28 DIAGNOSIS — R7303 Prediabetes: Secondary | ICD-10-CM

## 2021-10-28 DIAGNOSIS — E785 Hyperlipidemia, unspecified: Secondary | ICD-10-CM

## 2021-10-28 DIAGNOSIS — E039 Hypothyroidism, unspecified: Secondary | ICD-10-CM

## 2021-10-28 DIAGNOSIS — N1831 Chronic kidney disease, stage 3a: Secondary | ICD-10-CM

## 2021-10-28 DIAGNOSIS — I10 Essential (primary) hypertension: Secondary | ICD-10-CM | POA: Diagnosis not present

## 2021-10-28 DIAGNOSIS — I340 Nonrheumatic mitral (valve) insufficiency: Secondary | ICD-10-CM

## 2021-10-28 DIAGNOSIS — M109 Gout, unspecified: Secondary | ICD-10-CM

## 2021-10-28 DIAGNOSIS — I482 Chronic atrial fibrillation, unspecified: Secondary | ICD-10-CM | POA: Diagnosis not present

## 2021-10-28 DIAGNOSIS — Z780 Asymptomatic menopausal state: Secondary | ICD-10-CM

## 2021-10-28 DIAGNOSIS — K219 Gastro-esophageal reflux disease without esophagitis: Secondary | ICD-10-CM

## 2021-10-28 DIAGNOSIS — D171 Benign lipomatous neoplasm of skin and subcutaneous tissue of trunk: Secondary | ICD-10-CM

## 2021-10-28 DIAGNOSIS — E119 Type 2 diabetes mellitus without complications: Secondary | ICD-10-CM

## 2021-10-28 DIAGNOSIS — E210 Primary hyperparathyroidism: Secondary | ICD-10-CM

## 2021-10-28 LAB — BAYER DCA HB A1C WAIVED: HB A1C (BAYER DCA - WAIVED): 5.4 % (ref 4.8–5.6)

## 2021-10-28 MED ORDER — ATORVASTATIN CALCIUM 20 MG PO TABS: 20.0000 mg | ORAL_TABLET | Freq: Every day | ORAL | 1 refills | Status: DC

## 2021-10-28 MED ORDER — DAPAGLIFLOZIN PROPANEDIOL 10 MG PO TABS: 10.0000 mg | ORAL_TABLET | Freq: Every day | ORAL | 1 refills | Status: DC

## 2021-10-28 NOTE — Progress Notes (Signed)
? ?Assessment & Plan:  ?1. Prediabetes ?Well controlled. A1c back in a normal range with the addition of Farxiga for CKD. She was in a prediabetic range prior to [place] being initiated. ?- Bayer DCA Hb A1c Waived ? ?2. Essential hypertension ?Well controlled on current regimen.  ?- CBC with Differential/Platelet ?- Lipid panel ?- CMP14+EGFR ? ?3. Chronic a-fib ([place]) ?Well controlled on current regimen.  ?- CBC with Differential/Platelet ?- Lipid panel ?- CMP14+EGFR ? ?4. Nonrheumatic mitral valve regurgitation ?Managed by cardiology. ? ?5. Dyslipidemia ?Well controlled on current regimen.  ?- CBC with Differential/Platelet ?- Lipid panel ?- CMP14+EGFR ?- atorvastatin (LIPITOR) 20 MG tablet; Take 1 tablet (20 mg total) by mouth daily.  Dispense: 90 tablet; Refill: 1 ? ?6. Morbid obesity ([place]) ?Encouraged healthy eating and exercise. ?- CBC with Differential/Platelet ?- Lipid panel ?- CMP14+EGFR ? ?7. Stage 3a chronic kidney disease ([place]) ?Continue Farxiga.  ?- CMP14+EGFR ?- dapagliflozin propanediol (FARXIGA) 10 MG TABS tablet; Take 1 tablet (10 mg total) by mouth daily.  Dispense: 90 tablet; Refill: 1 ? ?8. Controlled gout ?Well controlled on current regimen.  ?- CMP14+EGFR ? ?9. Acquired hypothyroidism ?Well controlled on current regimen.  ? ?10. Gastroesophageal reflux disease without esophagitis ?Well controlled on current regimen.  ?- CMP14+EGFR ? ?11. Primary hyperparathyroidism ([place]) ?DEXA ordered today per endocrinology. ?- DG [facility] DEXA ? ?12. Lipoma of back ?- Ambulatory referral to Dermatology ? ? ?Follow-up: Return in about 3 months (around 01/27/2022) for follow-up of chronic medication conditions.  ? ?[name], MSN, APRN, FNP-C ?[place] ? ?Subjective:  ?Patient ID: Celesta Funderburk, female    DOB: 1940-10-23  Age: 81 y.o. MRN: [id number] ? ?Patient Care Team: ?Loman Brooklyn, FNP as PCP - General (Family Medicine) ?Minus Breeding, MD as PCP - Cardiology  (Cardiology) ?Paralee Cancel, MD as Consulting Physician (Orthopedic Surgery) ?Ilean China, RN as Case Manager ?[name], NP (Gastroenterology) ?Minus Breeding, MD as Consulting Physician (Cardiology) ?Celestia Khat, OD (Optometry) ?Lavera Guise, [facility] as Pharmacist (Family Medicine) ?Lendon Colonel, NP as Nurse Practitioner (Cardiology)  ? ?CC:  ?Chief Complaint  ?Patient presents with  ? Medical Management of Chronic Issues  ? Mass  ?  Lump on patients back that has been there x 1 year and has gotten bigger   ? ? ?HPI ?Hypertension: patient does check her blood pressure at home and reports her readings are 120-130s/60-70s. She had increased her amlodipine to 10 mg on her own; when she saw cardiology they agreed and wrote the new prescription. She does eat a low salt diet and has been trying to walk more. ? ?A-Fib: tolerating Eliquis with no further bleeding episodes. She is now able to afford it since being set up with our clinical pharmacist for prescription assistance and is very happy about this. ? ?Dyslipidemia: taking atorvastatin daily.  ? ?Nonrheumatic mitral valve regurgitation: patient had a transesophageal echocardiogram on 07/06/2021 with Dr. Gardiner Rhyme.  At that time she was advised to hold her Toprol-XL due to sinus bradycardia with heart rate in the 30s and low 40s. She had her follow-up with cardiology on 08/03/2021 at which time she was told her ejection fraction had returned to normal. ? ?Gout: no flares with Allopurinol 100 mg daily.  ? ?Hypothyroidism: taking levothyroxine daily as prescribed. ? ?GERD: taking Protonix daily.  ? ?Hyperparathyroidism: patient saw endocrinology on 10/04/2021 at which time she was asked to reduce her vitamin D from 5,000 units to 1,000 units. A  bone density scan was ordered which will be completed here today.  ? ?New Complaints: ?Patient reports a lipoma between her shoulder blades that has been present over a year. She feels it is enlarging. Is does  bother her. ? ? ?ROS: Negative unless specifically indicated above in HPI.  ? ? ?Current Outpatient Medications:  ?  acetaminophen (TYLENOL) 325 MG tablet, Take 2 tablets (650 mg total) by mouth every 6 (six) hours as needed for mild pain or headache (or Fever >/= 101)., Disp: 12 tablet, Rfl: 0 ?  allopurinol (ZYLOPRIM) 100 MG tablet, Take 1 tablet (100 mg total) by mouth daily., Disp: 90 tablet, Rfl: 1 ?  amLODipine (NORVASC) 10 MG tablet, Take 1 tablet (10 mg total) by mouth daily., Disp: 90 tablet, Rfl: 3 ?  apixaban (ELIQUIS) 5 MG TABS tablet, Take 1 tablet (5 mg total) by mouth 2 (two) times daily., Disp: 90 tablet, Rfl: 3 ?  atorvastatin (LIPITOR) 20 MG tablet, Take 1 tablet by mouth once daily, Disp: 30 tablet, Rfl: 0 ?  dapagliflozin propanediol (FARXIGA) 10 MG TABS tablet, Take 5 mg (1/2 tablet) by mouth once daily x1 week, then increase to 10 mg daily., Disp: 90 tablet, Rfl: 1 ?  diclofenac Sodium (VOLTAREN) 1 % GEL, APPLY 4 GRAMS TOPICALLY 4  TIMES DAILY (Patient taking differently: 2 g 2 (two) times daily.), Disp: 300 g, Rfl: 0 ?  furosemide (LASIX) 20 MG tablet, Take 1 tablet (20 mg total) by mouth daily., Disp: 90 tablet, Rfl: 2 ?  GNP CRANBERRY EXTRACT PO, Take 500 mg by mouth in the morning., Disp: , Rfl:  ?  hydrochlorothiazide (HYDRODIURIL) 12.5 MG tablet, Take 1 tablet (12.5 mg total) by mouth daily., Disp: 90 tablet, Rfl: 3 ?  levothyroxine (SYNTHROID) 50 MCG tablet, TAKE 1 TABLET BY MOUTH ONCE DAILY BEFORE BREAKFAST, Disp: 90 tablet, Rfl: 3 ?  loratadine (CLARITIN) 10 MG tablet, Take 10 mg by mouth daily., Disp: , Rfl:  ?  ONE TOUCH ULTRA TEST test strip, USE UP TO FOUR TIMES DAILY AS DIRECTED (Patient taking differently: 1 each by Other route 4 (four) times daily.), Disp: 400 each, Rfl: 3 ?  oxybutynin (DITROPAN-XL) 10 MG 24 hr tablet, Take 1 tablet (10 mg total) by mouth at bedtime., Disp: 90 tablet, Rfl: 1 ?  pantoprazole (PROTONIX) 40 MG tablet, Take 1 tablet (40 mg total) by mouth 2 (two)  times daily before a meal., Disp: 180 tablet, Rfl: 1 ?  Cholecalciferol (VITAMIN D-3) 125 MCG (5000 UT) TABS, Take 1,000 Units by mouth daily with breakfast., Disp: , Rfl:  ? ?No Known Allergies ? ?Past Medical History:  ?Diagnosis Date  ? Anemia   ? Arthritis   ? Ostearthritis- hips, knees, fingers  ? DVT (deep venous thrombosis) ([place])   ? Dyspnea   ? GERD (gastroesophageal reflux disease)   ? Headache(784.0)   ? tx. Valproic acid  ? History of diabetes mellitus   ? History of kidney stones   ? Hypertension   ? Hypothyroidism   ? Pulmonary embolism ([place])   ? ? ?Past Surgical History:  ?Procedure Laterality Date  ? ABDOMINAL HYSTERECTOMY    ? BILATERAL HIP ARTHROSCOPY Left   ? BIOPSY  12/08/2019  ? Procedure: BIOPSY;  Surgeon: Daneil Dolin, MD;  Location: [facility];  Service: Endoscopy;;  ? CATARACT EXTRACTION, BILATERAL    ? COLONOSCOPY N/A 12/08/2019  ? polyps (tubular adenoma), diverticulosis, colonic lipoma, no surveillance due to age  ? CYSTOSCOPY W/  URETERAL STENT PLACEMENT Right 12/25/2020  ? Procedure: CYSTOSCOPY WITH RETROGRADE PYELOGRAM/URETERAL STENT PLACEMENT;  Surgeon: Janith Lima, MD;  Location: [facility];  Service: Urology;  Laterality: Right;  ? CYSTOSCOPY/URETEROSCOPY/HOLMIUM LASER/STENT PLACEMENT Right 01/15/2021  ? Procedure: CYSTOSCOPY/RETROGRADE/URETEROSCOPY/HOLMIUM LASER/STENT EXCHANGE;  Surgeon: Janith Lima, MD;  Location: [facility];  Service: Urology;  Laterality: Right;  ? ESOPHAGOGASTRODUODENOSCOPY N/A 12/08/2019  ? normal esophagus with 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  ? ESOPHAGOGASTRODUODENOSCOPY (EGD) WITH PROPOFOL N/A 05/18/2021  ? Procedure: ESOPHAGOGASTRODUODENOSCOPY (EGD) WITH PROPOFOL;  Surgeon: Clarene Essex, MD;  Location: [facility];  Service: Endoscopy;  Laterality: N/A;  RFA; APC; ultraslim scope  ? GIVENS CAPSULE STUDY N/A 01/13/2020  ? Procedure: GIVENS CAPSULE STUDY;  Surgeon: Daneil Dolin, MD;  [facility]: [facility];   Service: Endoscopy;  Laterality: N/A;  7:30am  ? GIVENS CAPSULE STUDY N/A 05/04/2021  ? Procedure: GIVENS CAPSULE STUDY;  Surgeon: Clarene Essex, MD;  Location: [facility];  Service: Endoscopy;  Laterality: N/A;  ? MULTIPLE

## 2021-10-29 LAB — CBC WITH DIFFERENTIAL/PLATELET
Basophils Absolute: 0 10*3/uL (ref 0.0–0.2)
Basos: 1 %
EOS (ABSOLUTE): 0.2 10*3/uL (ref 0.0–0.4)
Eos: 3 %
Hematocrit: 32.1 % — ABNORMAL LOW (ref 34.0–46.6)
Hemoglobin: 10.1 g/dL — ABNORMAL LOW (ref 11.1–15.9)
Immature Grans (Abs): 0 10*3/uL (ref 0.0–0.1)
Immature Granulocytes: 0 %
Lymphocytes Absolute: 2 10*3/uL (ref 0.7–3.1)
Lymphs: 34 %
MCH: 26.2 pg — ABNORMAL LOW (ref 26.6–33.0)
MCHC: 31.5 g/dL (ref 31.5–35.7)
MCV: 83 fL (ref 79–97)
Monocytes Absolute: 0.9 10*3/uL (ref 0.1–0.9)
Monocytes: 16 %
Neutrophils Absolute: 2.8 10*3/uL (ref 1.4–7.0)
Neutrophils: 46 %
Platelets: 304 10*3/uL (ref 150–450)
RBC: 3.85 x10E6/uL (ref 3.77–5.28)
RDW: 13.5 % (ref 11.7–15.4)
WBC: 6 10*3/uL (ref 3.4–10.8)

## 2021-10-29 LAB — CMP14+EGFR
ALT: 11 IU/L (ref 0–32)
AST: 13 IU/L (ref 0–40)
Albumin/Globulin Ratio: 2.3 — ABNORMAL HIGH (ref 1.2–2.2)
Albumin: 4.4 g/dL (ref 3.7–4.7)
Alkaline Phosphatase: 122 IU/L — ABNORMAL HIGH (ref 44–121)
BUN/Creatinine Ratio: 25 (ref 12–28)
BUN: 38 mg/dL — ABNORMAL HIGH (ref 8–27)
Bilirubin Total: 0.2 mg/dL (ref 0.0–1.2)
CO2: 24 mmol/L (ref 20–29)
Calcium: 11 mg/dL — ABNORMAL HIGH (ref 8.7–10.3)
Chloride: 103 mmol/L (ref 96–106)
Creatinine, Ser: 1.54 mg/dL — ABNORMAL HIGH (ref 0.57–1.00)
Globulin, Total: 1.9 g/dL (ref 1.5–4.5)
Glucose: 111 mg/dL — ABNORMAL HIGH (ref 70–99)
Potassium: 4.6 mmol/L (ref 3.5–5.2)
Sodium: 143 mmol/L (ref 134–144)
Total Protein: 6.3 g/dL (ref 6.0–8.5)
eGFR: 34 mL/min/{1.73_m2} — ABNORMAL LOW (ref >59)

## 2021-10-29 LAB — LIPID PANEL
Chol/HDL Ratio: 3 ratio (ref 0.0–4.4)
Cholesterol, Total: 168 mg/dL (ref 100–199)
HDL: 56 mg/dL (ref >39)
LDL Chol Calc (NIH): 90 mg/dL (ref 0–99)
Triglycerides: 127 mg/dL (ref 0–149)
VLDL Cholesterol Cal: 22 mg/dL (ref 5–40)

## 2021-10-30 DIAGNOSIS — E210 Primary hyperparathyroidism: Secondary | ICD-10-CM | POA: Insufficient documentation

## 2021-10-30 DIAGNOSIS — R7303 Prediabetes: Secondary | ICD-10-CM | POA: Insufficient documentation

## 2021-10-30 MED ORDER — DAPAGLIFLOZIN PROPANEDIOL 10 MG PO TABS: 10.0000 mg | ORAL_TABLET | Freq: Every day | ORAL | 1 refills | Status: DC

## 2021-11-01 ENCOUNTER — Encounter: Payer: Self-pay | Admitting: Family Medicine

## 2021-11-24 ENCOUNTER — Encounter: Payer: Self-pay | Admitting: Family Medicine

## 2021-12-23 ENCOUNTER — Ambulatory Visit (INDEPENDENT_AMBULATORY_CARE_PROVIDER_SITE_OTHER): Payer: PPO

## 2021-12-23 VITALS — Wt 203.0 lb

## 2021-12-23 DIAGNOSIS — Z0000 Encounter for general adult medical examination without abnormal findings: Secondary | ICD-10-CM

## 2021-12-23 NOTE — Patient Instructions (Signed)
[name] , Thank you for taking time to come for your Medicare Wellness Visit. I appreciate your ongoing commitment to your health goals. Please review the following plan we discussed and let me know if I can assist you in the future.   Screening recommendations/referrals: Colonoscopy: Done 12/08/2019 - no repeat required Mammogram: No longer required Bone Density: Done 10/28/2021 - Repeat every 2-3 years  Recommended yearly ophthalmology/optometry visit for glaucoma screening and checkup Recommended yearly dental visit for hygiene and checkup  Vaccinations: Influenza vaccine: Done 05/10/2021 - Repeat annually  Pneumococcal vaccine: Done 07/02/2020 & 07/30/2021 Tdap vaccine: Due - recommended every 10 years Shingles vaccine: Done  12/03/2019 & 10/20/2020 Covid-19: Done 09/18/2019, 10/16/2019, & 07/02/2020  Advanced directives: in chart  Conditions/risks identified: Aim for 30 minutes of exercise or brisk walking, 6-8 glasses of water, and 5 servings of fruits and vegetables each day.   Next appointment: Follow up in one year for your annual wellness visit    Preventive Care 65 Years and Older, Female Preventive care refers to lifestyle choices and visits with your health care provider that can promote health and wellness. What does preventive care include? A yearly physical exam. This is also called an annual well check. Dental exams once or twice a year. Routine eye exams. Ask your health care provider how often you should have your eyes checked. Personal lifestyle choices, including: Daily care of your teeth and gums. Regular physical activity. Eating a healthy diet. Avoiding tobacco and drug use. Limiting alcohol use. Practicing safe sex. Taking low-dose aspirin every day. Taking vitamin and mineral supplements as recommended by your health care provider. What happens during an annual well check? The services and screenings done by your health care provider during your annual well  check will depend on your age, overall health, lifestyle risk factors, and family history of disease. Counseling  Your health care provider may ask you questions about your: Alcohol use. Tobacco use. Drug use. Emotional well-being. Home and relationship well-being. Sexual activity. Eating habits. History of falls. Memory and ability to understand (cognition). Work and work Statistician. Reproductive health. Screening  You may have the following tests or measurements: Height, weight, and BMI. Blood pressure. Lipid and cholesterol levels. These may be checked every 5 years, or more frequently if you are over 71 years old. Skin check. Lung cancer screening. You may have this screening every year starting at age 73 if you have a 30-pack-year history of smoking and currently smoke or have quit within the past 15 years. Fecal occult blood test (FOBT) of the stool. You may have this test every year starting at age 52. Flexible sigmoidoscopy or colonoscopy. You may have a sigmoidoscopy every 5 years or a colonoscopy every 10 years starting at age 67. Hepatitis C blood test. Hepatitis B blood test. Sexually transmitted disease (STD) testing. Diabetes screening. This is done by checking your blood sugar (glucose) after you have not eaten for a while (fasting). You may have this done every 1-3 years. Bone density scan. This is done to screen for osteoporosis. You may have this done starting at age 27. Mammogram. This may be done every 1-2 years. Talk to your health care provider about how often you should have regular mammograms. Talk with your health care provider about your test results, treatment options, and if necessary, the need for more tests. Vaccines  Your health care provider may recommend certain vaccines, such as: Influenza vaccine. This is recommended every year. Tetanus, diphtheria, and acellular  pertussis (Tdap, Td) vaccine. You may need a Td booster every 10 years. Zoster  vaccine. You may need this after age 46. Pneumococcal 13-valent conjugate (PCV13) vaccine. One dose is recommended after age 63. Pneumococcal polysaccharide (PPSV23) vaccine. One dose is recommended after age 71. Talk to your health care provider about which screenings and vaccines you need and how often you need them. This information is not intended to replace advice given to you by your health care provider. Make sure you discuss any questions you have with your health care provider. Document Released: 08/07/2015 Document Revised: 03/30/2016 Document Reviewed: 05/12/2015 Elsevier Interactive Patient Education  2017 [place] Prevention in the Home Falls can cause injuries. They can happen to people of all ages. There are many things you can do to make your home safe and to help prevent falls. What can I do on the outside of my home? Regularly fix the edges of walkways and driveways and fix any cracks. Remove anything that might make you trip as you walk through a door, such as a raised step or threshold. Trim any bushes or trees on the path to your home. Use bright outdoor lighting. Clear any walking paths of anything that might make someone trip, such as rocks or tools. Regularly check to see if handrails are loose or broken. Make sure that both sides of any steps have handrails. Any raised decks and porches should have guardrails on the edges. Have any leaves, snow, or ice cleared regularly. Use sand or salt on walking paths during winter. Clean up any spills in your garage right away. This includes oil or grease spills. What can I do in the bathroom? Use night lights. Install grab bars by the toilet and in the tub and shower. Do not use towel bars as grab bars. Use non-skid mats or decals in the tub or shower. If you need to sit down in the shower, use a plastic, non-slip stool. Keep the floor dry. Clean up any water that spills on the floor as soon as it happens. Remove  soap buildup in the tub or shower regularly. Attach bath mats securely with double-sided non-slip rug tape. Do not have throw rugs and other things on the floor that can make you trip. What can I do in the bedroom? Use night lights. Make sure that you have a light by your bed that is easy to reach. Do not use any sheets or blankets that are too big for your bed. They should not hang down onto the floor. Have a firm chair that has side arms. You can use this for support while you get dressed. Do not have throw rugs and other things on the floor that can make you trip. What can I do in the kitchen? Clean up any spills right away. Avoid walking on wet floors. Keep items that you use a lot in easy-to-reach places. If you need to reach something above you, use a strong step stool that has a grab bar. Keep electrical cords out of the way. Do not use floor polish or wax that makes floors slippery. If you must use wax, use non-skid floor wax. Do not have throw rugs and other things on the floor that can make you trip. What can I do with my stairs? Do not leave any items on the stairs. Make sure that there are handrails on both sides of the stairs and use them. Fix handrails that are broken or loose. Make sure that handrails  are as long as the stairways. Check any carpeting to make sure that it is firmly attached to the stairs. Fix any carpet that is loose or worn. Avoid having throw rugs at the top or bottom of the stairs. If you do have throw rugs, attach them to the floor with carpet tape. Make sure that you have a light switch at the top of the stairs and the bottom of the stairs. If you do not have them, ask someone to add them for you. What else can I do to help prevent falls? Wear shoes that: Do not have high heels. Have rubber bottoms. Are comfortable and fit you well. Are closed at the toe. Do not wear sandals. If you use a stepladder: Make sure that it is fully opened. Do not climb a  closed stepladder. Make sure that both sides of the stepladder are locked into place. Ask someone to hold it for you, if possible. Clearly mark and make sure that you can see: Any grab bars or handrails. First and last steps. Where the edge of each step is. Use tools that help you move around (mobility aids) if they are needed. These include: Canes. Walkers. Scooters. Crutches. Turn on the lights when you go into a dark area. Replace any light bulbs as soon as they burn out. Set up your furniture so you have a clear path. Avoid moving your furniture around. If any of your floors are uneven, fix them. If there are any pets around you, be aware of where they are. Review your medicines with your doctor. Some medicines can make you feel dizzy. This can increase your chance of falling. Ask your doctor what other things that you can do to help prevent falls. This information is not intended to replace advice given to you by your health care provider. Make sure you discuss any questions you have with your health care provider. Document Released: 05/07/2009 Document Revised: 12/17/2015 Document Reviewed: 08/15/2014 Elsevier Interactive Patient Education  2017 [employer].

## 2021-12-23 NOTE — Progress Notes (Signed)
Subjective:   [name] [name] is a 81 y.o. female who presents for Medicare Annual (Subsequent) preventive examination.  Virtual Visit via Telephone Note  I connected with  [name] [name] on 12/23/21 at 11:15 AM EDT by telephone and verified that I am speaking with the correct person using two identifiers.  Location: Patient: Home Provider: [facility] Persons participating in the virtual visit: patient/Nurse Health Advisor   I discussed the limitations, risks, security and privacy concerns of performing an evaluation and management service by telephone and the availability of in person appointments. The patient expressed understanding and agreed to proceed.  Interactive audio and video telecommunications were attempted between this nurse and patient, however failed, due to patient having technical difficulties OR patient did not have access to video capability.  We continued and completed visit with audio only.  Some vital signs may be absent or patient reported.   [name] Doyle E Jahon Bart, LPN   Review of Systems     Cardiac Risk Factors include: advanced age (>64mn, >>23women);diabetes mellitus;dyslipidemia;hypertension;obesity (BMI >30kg/m2);sedentary lifestyle;Other (see comment), Risk factor comments: A.Fib, mitral and tricuspid valve regurgitation     Objective:    Today's Vitals   12/23/21 1116  Weight: 203 lb (92.1 kg)   Body mass index is 35.96 kg/m.     12/23/2021   11:33 AM 07/06/2021    7:43 AM 05/18/2021   12:54 PM 05/01/2021    9:29 PM 05/01/2021    2:39 PM 02/06/2021    9:30 PM 01/11/2021    8:58 AM  Advanced Directives  Does Patient Have a Medical Advance Directive? Yes Yes No Yes Yes No Yes  Type of AParamedicof [place] will [place]Living will  Living will;Healthcare Power of [place]elham ManorLiving will  [place] will  Does patient want to make changes to  medical advance directive?    No - Patient declined     Copy of HOutlookin Chart? Yes - validated most recent copy scanned in chart (See row information)      No - copy requested  Would patient like information on creating a medical advance directive?   No - Patient declined   No - Patient declined     Current Medications (verified) Outpatient Encounter Medications as of 12/23/2021  Medication Sig   acetaminophen (TYLENOL) 325 MG tablet Take 2 tablets (650 mg total) by mouth every 6 (six) hours as needed for mild pain or headache (or Fever >/= 101).   allopurinol (ZYLOPRIM) 100 MG tablet Take 1 tablet (100 mg total) by mouth daily.   apixaban (ELIQUIS) 5 MG TABS tablet Take 1 tablet (5 mg total) by mouth 2 (two) times daily.   atorvastatin (LIPITOR) 20 MG tablet Take 1 tablet (20 mg total) by mouth daily.   dapagliflozin propanediol (FARXIGA) 10 MG TABS tablet Take 1 tablet (10 mg total) by mouth daily.   furosemide (LASIX) 20 MG tablet Take 1 tablet (20 mg total) by mouth daily.   GNP CRANBERRY EXTRACT PO Take 500 mg by mouth in the morning.   hydrochlorothiazide (HYDRODIURIL) 12.5 MG tablet Take 1 tablet (12.5 mg total) by mouth daily.   levothyroxine (SYNTHROID) 50 MCG tablet TAKE 1 TABLET BY MOUTH ONCE DAILY BEFORE BREAKFAST   loratadine (CLARITIN) 10 MG tablet Take 10 mg by mouth daily.   ONE TOUCH ULTRA TEST test strip USE UP TO FOUR TIMES DAILY AS DIRECTED (Patient taking differently:  1 each by Other route 4 (four) times daily.)   oxybutynin (DITROPAN-XL) 10 MG 24 hr tablet Take 1 tablet (10 mg total) by mouth at bedtime.   pantoprazole (PROTONIX) 40 MG tablet Take 1 tablet (40 mg total) by mouth 2 (two) times daily before a meal.   amLODipine (NORVASC) 10 MG tablet Take 1 tablet (10 mg total) by mouth daily.   [DISCONTINUED] diclofenac Sodium (VOLTAREN) 1 % GEL APPLY 4 GRAMS TOPICALLY 4  TIMES DAILY (Patient taking differently: 2 g 2 (two) times daily.)   No  facility-administered encounter medications on file as of 12/23/2021.    Allergies (verified) Patient has no known allergies.   History: Past Medical History:  Diagnosis Date   Anemia    Arthritis    Ostearthritis- hips, knees, fingers   Basal cell carcinoma (BCC) of right hand 10/2021   Dx by [facility]   DVT (deep venous thrombosis) (HCC)    Dyspnea    GERD (gastroesophageal reflux disease)    Headache(784.0)    tx. Valproic acid   History of diabetes mellitus    History of kidney stones    Hypertension    Hypothyroidism    Pulmonary embolism [facility])    Past Surgical History:  Procedure Laterality Date   ABDOMINAL HYSTERECTOMY     BILATERAL HIP ARTHROSCOPY Left    BIOPSY  12/08/2019   Procedure: BIOPSY;  Surgeon: Daneil Dolin, MD;  [facility];  Service: Endoscopy;;   CATARACT EXTRACTION, BILATERAL     COLONOSCOPY N/A 12/08/2019   polyps (tubular adenoma), diverticulosis, colonic lipoma, no surveillance due to age   9 W/ [place] Right 12/25/2020   Procedure: CYSTOSCOPY WITH RETROGRADE PYELOGRAM/URETERAL STENT PLACEMENT;  Surgeon: Janith Lima, MD;  Location: [facility];  Service: Urology;  Laterality: Right;   CYSTOSCOPY/URETEROSCOPY/HOLMIUM LASER/STENT PLACEMENT Right 01/15/2021   Procedure: CYSTOSCOPY/RETROGRADE/URETEROSCOPY/HOLMIUM LASER/STENT EXCHANGE;  Surgeon: Janith Lima, MD;  Location: [facility];  Service: Urology;  Laterality: Right;   ESOPHAGOGASTRODUODENOSCOPY N/A 12/08/2019   normal esophagus with 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   ESOPHAGOGASTRODUODENOSCOPY (EGD) WITH PROPOFOL N/A 05/18/2021   Procedure: ESOPHAGOGASTRODUODENOSCOPY (EGD) WITH PROPOFOL;  Surgeon: Clarene Essex, MD;  [facility];  Service: Endoscopy;  Laterality: N/A;  RFA; APC; ultraslim scope   GIVENS CAPSULE STUDY N/A 01/13/2020   Procedure: GIVENS CAPSULE STUDY;  Surgeon: Daneil Dolin, MD;  [facility];  Service: Endoscopy;  Laterality: N/A;  7:30am   GIVENS CAPSULE STUDY N/A 05/04/2021   Procedure: GIVENS CAPSULE STUDY;  Surgeon: Clarene Essex, MD;  [facility];  Service: Endoscopy;  Laterality: N/A;   MULTIPLE TOOTH EXTRACTIONS     60's   PARATHYROIDECTOMY     POLYPECTOMY  12/08/2019   Procedure: POLYPECTOMY;  Surgeon: Daneil Dolin, MD;  [facility];  Service: Endoscopy;;   RADIOFREQUENCY ABLATION  05/18/2021   Procedure: RADIO FREQUENCY ABLATION;  Surgeon: Clarene Essex, MD;  [facility];  Service: Endoscopy;;   TEE WITHOUT CARDIOVERSION N/A 07/06/2021   Procedure: TRANSESOPHAGEAL ECHOCARDIOGRAM (TEE);  Surgeon: Donato Heinz, MD;  Location: [facility] ENDOSCOPY;  Service: Cardiovascular;  Laterality: N/A;   TOTAL HIP ARTHROPLASTY Right 10/29/2013   Procedure: RIGHT TOTAL HIP ARTHROPLASTY ANTERIOR APPROACH;  Surgeon: Mauri Pole, MD;  [facility];  Service: Orthopedics;  Laterality: Right;   Family History  Problem Relation Age of Onset   Colitis Sister 63  alive   Cancer Sister 61       colon   Cancer Mother 71       uterine   Heart disease Father 25       heart failure   Heart attack Brother 52   Healthy Daughter    Healthy Son    Pulmonary embolism Brother 41   Heart disease Brother    Healthy Son    Healthy Son    Social History   Socioeconomic History   Marital status: Widowed    Spouse name: Not on file   Number of children: 4   Years of education: 12   Highest education level: High school graduate  Occupational History   Occupation: Retired    Comment: [employer]  Tobacco Use   Smoking status: Never   Smokeless tobacco: Never  Vaping Use   Vaping Use: Never used  Substance and Sexual Activity   Alcohol use: No    Alcohol/week: 0.0 standard drinks   Drug use: No   Sexual activity: Not Currently  Other Topics Concern   Not on file  Social History Narrative   Patient is widowed and lives  in a one story home. She has four adult children and one son lives with her.    Social Determinants of Health   Financial Resource Strain: Low Risk    Difficulty of Paying Living Expenses: Not hard at all  Food Insecurity: No Food Insecurity   Worried About Charity fundraiser in the Last Year: Never true   [place] in the Last Year: Never true  Transportation Needs: No Transportation Needs   Lack of Transportation (Medical): No   Lack of Transportation (Non-Medical): No  Physical Activity: Insufficiently Active   Days of Exercise per Week: 7 days   Minutes of Exercise per Session: 20 min  Stress: No Stress Concern Present   Feeling of Stress : Not at all  Social Connections: Moderately Integrated   Frequency of Communication with Friends and Family: More than three times a week   Frequency of Social Gatherings with Friends and Family: More than three times a week   Attends Religious Services: More than 4 times per year   Active Member of [employer] or Organizations: Yes   Attends Archivist Meetings: More than 4 times per year   Marital Status: Widowed    Tobacco Counseling Counseling given: Not Answered   Clinical Intake:  Pre-visit preparation completed: Yes  Pain : No/denies pain     BMI - recorded: 35.96 Nutritional Status: BMI > 30  Obese Nutritional Risks: None Diabetes: No  How often do you need to have someone help you when you read instructions, pamphlets, or other written materials from your doctor or pharmacy?: 1 - Never  Diabetic? no  Interpreter Needed?: No  Information entered by :: Ashland Osmer, LPN   Activities of Daily Living    12/23/2021   11:25 AM 05/01/2021    9:29 PM  In your present state of health, do you have any difficulty performing the following activities:  Hearing? 0 0  Vision? 0 0  Difficulty concentrating or making decisions? 1 0  Walking or climbing stairs? 1 0  Dressing or bathing? 0 0  Doing errands, shopping?  0 0  Preparing Food and eating ? N   Using the Toilet? N   In the past six months, have you accidently leaked urine? Y   Comment wears pads for protection  Do you have problems with loss of bowel control? N   Managing your Medications? N   Managing your Finances? N   Housekeeping or managing your Housekeeping? N     Patient Care Team: Loman Brooklyn, FNP as PCP - General (Family Medicine) Minus Breeding, MD as PCP - Cardiology (Cardiology) Ilean China, RN as Case Manager [name], NP (Gastroenterology) Minus Breeding, MD as Consulting Physician (Cardiology) Celestia Khat, [place] (Optometry) Lavera Guise, [facility] as Pharmacist (Family Medicine) Lendon Colonel, NP as Nurse Practitioner (Cardiology) [facility], Melanie Crazier, MD as Attending Physician (Endocrinology) Clarene Essex, MD as Consulting Physician (Gastroenterology) Janith Lima, MD as Consulting Physician (Urology)  Indicate any recent Medical Services you may have received from other than [facility] providers in the past year (date may be approximate).     Assessment:   This is a routine wellness examination for [facility].  Hearing/Vision screen Hearing Screening - Comments:: Denies hearing difficulties   Vision Screening - Comments:: Wears readers prn- up to date with routine eye exams with [facility]  Dietary issues and exercise activities discussed: Current Exercise Habits: Home exercise routine, Type of exercise: walking;Other - see comments (house and yard work), Time (Minutes): 20, Frequency (Times/Week): 7, Weekly Exercise (Minutes/Week): 140, Intensity: Mild, Exercise limited by: cardiac condition(s)   Goals Addressed             This Visit's Progress    Exercise 150 min/wk Moderate Activity   Not on track    Patient Stated   Not on track    12/23/2021 AWV Goal: Exercise for General Health  Patient will verbalize understanding of the benefits of increased physical activity: Exercising  regularly is important. It will improve your overall fitness, flexibility, and endurance. Regular exercise also will improve your overall health. It can help you control your weight, reduce stress, and improve your bone density. Over the next year, patient will increase physical activity as tolerated with a goal of at least 150 minutes of moderate physical activity per week.  You can tell that you are exercising at a moderate intensity if your heart starts beating faster and you start breathing faster but can still hold a conversation. Moderate-intensity exercise ideas include: Walking 1 mile (1.6 km) in about 15 minutes Biking Hiking Golfing Dancing Water aerobics Patient will verbalize understanding of everyday activities that increase physical activity by providing examples like the following: Yard work, such as: Sales promotion account executive Gardening Washing windows or floors Patient will be able to explain general safety guidelines for exercising:  Before you start a new exercise program, talk with your health care provider. Do not exercise so much that you hurt yourself, feel dizzy, or get very short of breath. Wear comfortable clothes and wear shoes with good support. Drink plenty of water while you exercise to prevent dehydration or heat stroke. Work out until your breathing and your heartbeat get faster.        Depression Screen    12/23/2021   11:24 AM 10/28/2021    8:03 AM 07/30/2021   11:19 AM 05/10/2021   10:05 AM 03/23/2021   10:20 AM 02/24/2021   11:27 AM 01/21/2021   10:23 AM  PHQ 2/9 Scores  PHQ - 2 Score 0 0 0 0 0 0 0  PHQ- 9 Score 0 0 0 0 0  0    Fall Risk    12/23/2021  11:17 AM 10/28/2021    8:03 AM 07/30/2021   11:19 AM 05/10/2021   10:05 AM 03/23/2021   10:20 AM  Fall Risk   Falls in the past year? 0 0 0 0 0  Number falls in past yr: 0      Injury with Fall? 0      Risk for fall due to  : Impaired balance/gait;Orthopedic patient      Follow up Education provided;Falls prevention discussed        FALL RISK PREVENTION PERTAINING TO THE HOME:  Any stairs in or around the home? Yes  - also has a ramp If so, are there any without handrails? No  Home free of loose throw rugs in walkways, pet beds, electrical cords, etc? Yes  Adequate lighting in your home to reduce risk of falls? Yes   ASSISTIVE DEVICES UTILIZED TO PREVENT FALLS:  Life alert? No  Use of a cane, walker or w/c? Yes  Grab bars in the bathroom? Yes  Shower chair or bench in shower? Yes  Elevated toilet seat or a handicapped toilet? Yes   TIMED UP AND GO:  Was the test performed? No . Telephonic visit  Cognitive Function:    08/01/2017   10:52 AM  MMSE - Mini Mental State Exam  Orientation to time 5  Orientation to Place 5  Registration 3  Attention/ Calculation 5  Recall 2  Language- name 2 objects 2  Language- repeat 1  Language- follow 3 step command 3  Language- read & follow direction 1  Write a sentence 1  Copy design 1  Total score 29        12/23/2021   11:26 AM 11/21/2019   10:44 AM  6CIT Screen  What Year? 0 points 0 points  What month? 3 points 0 points  What time? 0 points 0 points  Count back from 20 0 points 0 points  Months in reverse 2 points 0 points  Repeat phrase 2 points 0 points  Total Score 7 points 0 points    Immunizations Immunization History  Administered Date(s) Administered   Fluad Quad(high Dose 65+) 04/04/2019, 06/04/2020, 05/10/2021   Influenza, High Dose Seasonal PF 05/16/2016, 08/01/2017, 08/28/2018   Moderna SARS-COV2 Booster Vaccination 07/02/2020   Moderna Sars-Covid-2 Vaccination 09/18/2019, 10/16/2019   PNEUMOCOCCAL CONJUGATE-20 07/30/2021   Pneumococcal Conjugate-13 07/02/2020   Zoster Recombinat (Shingrix) 12/03/2019, 10/20/2020    TDAP status: Due, Education has been provided regarding the importance of this vaccine. Advised may receive  this vaccine at local pharmacy or Health Dept. Aware to provide a copy of the vaccination record if obtained from local pharmacy or Health Dept. Verbalized acceptance and understanding.  Flu Vaccine status: Up to date  Pneumococcal vaccine status: Up to date  Covid-19 vaccine status: Completed vaccines  Qualifies for Shingles Vaccine? Yes   Zostavax completed Yes   Shingrix Completed?: Yes  Screening Tests Health Maintenance  Topic Date Due   OPHTHALMOLOGY EXAM  Never done   COVID-19 Vaccine (3 - Moderna risk series) 01/25/2022 (Originally 07/30/2020)   TETANUS/TDAP  10/29/2022 (Originally 08/16/1959)   FOOT EXAM  01/21/2022   INFLUENZA VACCINE  02/22/2022   HEMOGLOBIN A1C  04/29/2022   URINE MICROALBUMIN  05/10/2022   DEXA SCAN  10/28/2024   Pneumonia Vaccine 26+ Years old  Completed   Zoster Vaccines- Shingrix  Completed   HPV VACCINES  Aged Out    Health Maintenance  Health Maintenance Due  Topic Date Due   OPHTHALMOLOGY  EXAM  Never done    Colorectal cancer screening: No longer required.   Mammogram status: No longer required due to age.  Bone Density status: Completed 10/28/2021. Results reflect: Bone density results: OSTEOPENIA. Repeat every 3 years.  Lung Cancer Screening: (Low Dose CT Chest recommended if Age 43-80 years, 30 pack-year currently smoking OR have quit w/in 15years.) does not qualify.  Additional Screening:  Hepatitis C Screening: does not qualify  Vision Screening: Recommended annual ophthalmology exams for early detection of glaucoma and other disorders of the eye. Is the patient up to date with their annual eye exam?  Yes  Who is the provider or what is the name of the office in which the patient attends annual eye exams? [place] If pt is not established with a provider, would they like to be referred to a provider to establish care? No .   Dental Screening: Recommended annual dental exams for proper oral hygiene  Community Resource  Referral / Chronic Care Management: CRR required this visit?  No   CCM required this visit?  No      Plan:     I have personally reviewed and noted the following in the patient's chart:   Medical and social history Use of alcohol, tobacco or illicit drugs  Current medications and supplements including opioid prescriptions.  Functional ability and status Nutritional status Physical activity Advanced directives List of other physicians Hospitalizations, surgeries, and ER visits in previous 12 months Vitals Screenings to include cognitive, depression, and falls Referrals and appointments  In addition, I have reviewed and discussed with patient certain preventive protocols, quality metrics, and best practice recommendations. A written personalized care plan for preventive services as well as general preventive health recommendations were provided to patient.     Sandrea Hammond, LPN   7/0/0174   Nurse Notes: None

## 2022-01-31 ENCOUNTER — Encounter: Payer: Self-pay | Admitting: Family Medicine

## 2022-02-02 ENCOUNTER — Encounter: Payer: Self-pay | Admitting: Family Medicine

## 2022-02-02 ENCOUNTER — Ambulatory Visit (INDEPENDENT_AMBULATORY_CARE_PROVIDER_SITE_OTHER): Payer: PPO | Admitting: Family Medicine

## 2022-02-02 VITALS — BP 127/59 | HR 76 | Temp 98.1°F | Ht 63.0 in | Wt 203.0 lb

## 2022-02-02 DIAGNOSIS — K219 Gastro-esophageal reflux disease without esophagitis: Secondary | ICD-10-CM

## 2022-02-02 DIAGNOSIS — N1831 Chronic kidney disease, stage 3a: Secondary | ICD-10-CM | POA: Diagnosis not present

## 2022-02-02 DIAGNOSIS — E785 Hyperlipidemia, unspecified: Secondary | ICD-10-CM

## 2022-02-02 DIAGNOSIS — I482 Chronic atrial fibrillation, unspecified: Secondary | ICD-10-CM | POA: Diagnosis not present

## 2022-02-02 DIAGNOSIS — Z596 Low income: Secondary | ICD-10-CM

## 2022-02-02 DIAGNOSIS — I10 Essential (primary) hypertension: Secondary | ICD-10-CM

## 2022-02-02 DIAGNOSIS — D500 Iron deficiency anemia secondary to blood loss (chronic): Secondary | ICD-10-CM

## 2022-02-02 DIAGNOSIS — E039 Hypothyroidism, unspecified: Secondary | ICD-10-CM

## 2022-02-02 DIAGNOSIS — M109 Gout, unspecified: Secondary | ICD-10-CM

## 2022-02-02 DIAGNOSIS — R7303 Prediabetes: Secondary | ICD-10-CM

## 2022-02-02 DIAGNOSIS — E210 Primary hyperparathyroidism: Secondary | ICD-10-CM

## 2022-02-02 MED ORDER — APIXABAN 2.5 MG PO TABS: 2.5000 mg | ORAL_TABLET | Freq: Two times a day (BID) | ORAL | 1 refills | Status: DC

## 2022-02-02 NOTE — Progress Notes (Signed)
Assessment & Plan:  1. Stage 3a chronic kidney disease ([place]) Continue Farxiga and avoidance of NSAIDs. Referring back to our clinical pharmacist for prescription assistance of Farxiga. - CMP14+EGFR - AMB Referral to [place]  2. Iron deficiency anemia due to chronic blood loss - CBC with Differential/Platelet  3. Chronic a-fib (HCC) Well controlled on current regimen. Patient is in need of a refill of Eliquis - dosage decreased from 5 mg BID to 2.5 mg BID due to CKD and age. Referring back to our clinical pharmacist for prescription assistance of Eliquis.  - CBC with Differential/Platelet - CMP14+EGFR - apixaban (ELIQUIS) 2.5 MG TABS tablet; Take 1 tablet (2.5 mg total) by mouth 2 (two) times daily.  Dispense: 180 tablet; Refill: 1 - AMB Referral to [place]  4. Essential hypertension Well controlled on current regimen.  - CBC with Differential/Platelet - CMP14+EGFR  5. Dyslipidemia Well controlled on current regimen.  - CMP14+EGFR  6. Prediabetes Well controlled on current regimen.   7. Morbid obesity ([place]) Encouraged healthy eating and exercise.  8. Gastroesophageal reflux disease without esophagitis Well controlled on current regimen.  - CMP14+EGFR  9. Acquired hypothyroidism Well controlled on current regimen.   10. Controlled gout Well controlled on current regimen.  - CMP14+EGFR  11. Primary hyperparathyroidism ([place]) Managed by endocrinology.  12. Patient cannot afford medications Farxiga and Eliquis. - AMB Referral to [place]   Follow-up: Return in about 3 months (around 05/05/2022) for follow-up of chronic medication conditions with T. Lilia Pro.   [name], MSN, APRN, FNP-C [facility]  Subjective:  Patient ID: [name] [name], female    DOB: 01/24/1941  Age: 81 y.o. MRN: [id number]  Patient Care Team: Loman Brooklyn, FNP as PCP - General (Family  Medicine) Minus Breeding, MD as PCP - Cardiology (Cardiology) Ilean China, RN as Case Manager [name], NP (Gastroenterology) Minus Breeding, MD as Consulting Physician (Cardiology) Celestia Khat, [place] (Optometry) Lavera Guise, [facility] as Pharmacist (Family Medicine) Lendon Colonel, NP as Nurse Practitioner (Cardiology) [facility], Melanie Crazier, MD as Attending Physician (Endocrinology) Clarene Essex, MD as Consulting Physician (Gastroenterology) Janith Lima, MD as Consulting Physician (Urology)   CC:  Chief Complaint  Patient presents with   Medical Management of Chronic Issues   Weakness    Patient states it has been going on for a few weeks.     HPI Hypertension: patient does check her blood pressure at home and reports her readings are 120-130s/60-70s. She does eat a low salt diet and has been trying to walk more.  A-Fib: tolerating Eliquis with no further bleeding episodes. She is now able to afford it since being set up with our clinical pharmacist for prescription assistance and is very happy about this.  Dyslipidemia: taking atorvastatin daily.   Nonrheumatic mitral valve regurgitation: patient had a transesophageal echocardiogram on 07/06/2021 with Dr. Gardiner Rhyme.  At that time she was advised to hold her Toprol-XL due to sinus bradycardia with heart rate in the 30s and low 40s. She had her follow-up with cardiology on 08/03/2021 at which time she was told her ejection fraction had returned to normal.  Prediabetes: A1c is in a normal range since starting [place] for CKD.   Gout: no flares with Allopurinol 100 mg daily.   Hypothyroidism: taking levothyroxine daily as prescribed.  GERD: taking Protonix daily.   Hyperparathyroidism: patient saw endocrinology on 10/04/2021 at which time she was asked to reduce her vitamin  D from 5,000 units to 1,000 units.   New Complaints: Patient reports weakness for the past few weeks. Denies chest pain, shortness of  breath, and dizziness. She is sleeping good. She feels like her hemoglobin is low again. She did have a viral URI last week.   ROS: Negative unless specifically indicated above in HPI.    Current Outpatient Medications:    acetaminophen (TYLENOL) 325 MG tablet, Take 2 tablets (650 mg total) by mouth every 6 (six) hours as needed for mild pain or headache (or Fever >/= 101)., Disp: 12 tablet, Rfl: 0   allopurinol (ZYLOPRIM) 100 MG tablet, Take 1 tablet (100 mg total) by mouth daily., Disp: 90 tablet, Rfl: 1   apixaban (ELIQUIS) 5 MG TABS tablet, Take 1 tablet (5 mg total) by mouth 2 (two) times daily., Disp: 90 tablet, Rfl: 3   atorvastatin (LIPITOR) 20 MG tablet, Take 1 tablet (20 mg total) by mouth daily., Disp: 90 tablet, Rfl: 1   dapagliflozin propanediol (FARXIGA) 10 MG TABS tablet, Take 1 tablet (10 mg total) by mouth daily., Disp: 90 tablet, Rfl: 1   furosemide (LASIX) 20 MG tablet, Take 1 tablet (20 mg total) by mouth daily., Disp: 90 tablet, Rfl: 2   GNP CRANBERRY EXTRACT PO, Take 500 mg by mouth in the morning., Disp: , Rfl:    hydrochlorothiazide (HYDRODIURIL) 12.5 MG tablet, Take 1 tablet (12.5 mg total) by mouth daily., Disp: 90 tablet, Rfl: 3   levothyroxine (SYNTHROID) 50 MCG tablet, TAKE 1 TABLET BY MOUTH ONCE DAILY BEFORE BREAKFAST, Disp: 90 tablet, Rfl: 3   loratadine (CLARITIN) 10 MG tablet, Take 10 mg by mouth daily., Disp: , Rfl:    ONE TOUCH ULTRA TEST test strip, USE UP TO FOUR TIMES DAILY AS DIRECTED (Patient taking differently: 1 each by Other route 4 (four) times daily.), Disp: 400 each, Rfl: 3   oxybutynin (DITROPAN-XL) 10 MG 24 hr tablet, Take 1 tablet (10 mg total) by mouth at bedtime., Disp: 90 tablet, Rfl: 1   pantoprazole (PROTONIX) 40 MG tablet, Take 1 tablet (40 mg total) by mouth 2 (two) times daily before a meal., Disp: 180 tablet, Rfl: 1   amLODipine (NORVASC) 10 MG tablet, Take 1 tablet (10 mg total) by mouth daily., Disp: 90 tablet, Rfl: 3  No Known  Allergies  Past Medical History:  Diagnosis Date   Anemia    Arthritis    Ostearthritis- hips, knees, fingers   Basal cell carcinoma (BCC) of right hand 10/2021   Dx by [facility]   DVT (deep venous thrombosis) (HCC)    Dyspnea    GERD (gastroesophageal reflux disease)    Headache(784.0)    tx. Valproic acid   History of diabetes mellitus    History of kidney stones    Hypertension    Hypothyroidism    Pulmonary embolism [facility])     Past Surgical History:  Procedure Laterality Date   ABDOMINAL HYSTERECTOMY     BILATERAL HIP ARTHROSCOPY Left    BIOPSY  12/08/2019   Procedure: BIOPSY;  Surgeon: Daneil Dolin, MD;  Location: [facility];  Service: Endoscopy;;   CATARACT EXTRACTION, BILATERAL     COLONOSCOPY N/A 12/08/2019   polyps (tubular adenoma), diverticulosis, colonic lipoma, no surveillance due to age   31 W/ [place] Right 12/25/2020   Procedure: CYSTOSCOPY WITH RETROGRADE PYELOGRAM/URETERAL STENT PLACEMENT;  Surgeon: Janith Lima, MD;  Location: [facility] OR[facility];  Service: Urology;  Laterality: Right;   CYSTOSCOPY/URETEROSCOPY/HOLMIUM LASER/STENT  PLACEMENT Right 01/15/2021   Procedure: CYSTOSCOPY/RETROGRADE/URETEROSCOPY/HOLMIUM LASER/STENT EXCHANGE;  Surgeon: Janith Lima, MD;  Location: [facility];  Service: Urology;  Laterality: Right;   ESOPHAGOGASTRODUODENOSCOPY N/A 12/08/2019   normal esophagus with 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   ESOPHAGOGASTRODUODENOSCOPY (EGD) WITH PROPOFOL N/A 05/18/2021   Procedure: ESOPHAGOGASTRODUODENOSCOPY (EGD) WITH PROPOFOL;  Surgeon: Clarene Essex, MD;  Location: [facility];  Service: Endoscopy;  Laterality: N/A;  RFA; APC; ultraslim scope   GIVENS CAPSULE STUDY N/A 01/13/2020   Procedure: GIVENS CAPSULE STUDY;  Surgeon: Daneil Dolin, MD;  [facility];  Service: Endoscopy;  Laterality: N/A;  7:30am   GIVENS CAPSULE STUDY N/A 05/04/2021   Procedure: GIVENS CAPSULE  STUDY;  Surgeon: Clarene Essex, MD;  [facility]: [facility];  Service: Endoscopy;  Laterality: N/A;   MULTIPLE TOOTH EXTRACTIONS     60's   PARATHYROIDECTOMY     POLYPECTOMY  12/08/2019   Procedure: POLYPECTOMY;  Surgeon: Daneil Dolin, MD;  [facility];  Service: Endoscopy;;   RADIOFREQUENCY ABLATION  05/18/2021   Procedure: RADIO FREQUENCY ABLATION;  Surgeon: Clarene Essex, MD;  [facility];  Service: Endoscopy;;   TEE WITHOUT CARDIOVERSION N/A 07/06/2021   Procedure: TRANSESOPHAGEAL ECHOCARDIOGRAM (TEE);  Surgeon: Donato Heinz, MD;  Location: [facility] ENDOSCOPY;  Service: Cardiovascular;  Laterality: N/A;   TOTAL HIP ARTHROPLASTY Right 10/29/2013   Procedure: RIGHT TOTAL HIP ARTHROPLASTY ANTERIOR APPROACH;  Surgeon: Mauri Pole, MD;  Location: [facility];  Service: Orthopedics;  Laterality: Right;    Family History  Problem Relation Age of Onset   Colitis Sister 87       alive   Cancer Sister 89       colon   Cancer Mother 35       uterine   Heart disease Father 19       heart failure   Heart attack Brother 24   Healthy Daughter    Healthy Son    Pulmonary embolism Brother 17   Heart disease Brother    Healthy Son    Healthy Son     Social History   Socioeconomic History   Marital status: Widowed    Spouse name: Not on file   Number of children: 4   Years of education: 12   Highest education level: High school graduate  Occupational History   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 She has four adult children and one son lives with her.    Social Determinants of Health    Financial Resource Strain: Low Risk  (12/23/2021)   Overall Financial Resource Strain (CARDIA)    Difficulty of Paying Living Expenses: Not hard at all  Food Insecurity: No Food Insecurity (12/23/2021)   Hunger Vital Sign    Worried About Running Out of Food in the Last Year: Never true    Ran Out of Food in the Last Year: Never true  Transportation Needs: No Transportation Needs (12/23/2021)   PRAPARE - Hydrologist (Medical):  No    Lack of Transportation (Non-Medical): No  Physical Activity: Insufficiently Active (12/23/2021)   Exercise Vital Sign    Days of Exercise per Week: 7 days    Minutes of Exercise per Session: 20 min  Stress: No Stress Concern Present (12/23/2021)   [place]    Feeling of Stress : Not at all  Social Connections: Moderately Integrated (12/23/2021)   Social Connection and Isolation Panel [NHANES]    Frequency of Communication with Friends and Family: More than three times a week    Frequency of Social Gatherings with Friends and Family: More than three times a week    Attends Religious Services: More than 4 times per year    Active Member of [employer] or Organizations: Yes    Attends Archivist Meetings: More than 4 times per year    Marital Status: Widowed  Intimate Partner Violence: Not At Risk (12/23/2021)   Humiliation, Afraid, Rape, and Kick questionnaire    Fear of Current or Ex-Partner: No    Emotionally Abused: No    Physically Abused: No    Sexually Abused: No      Objective:    BP (!) 127/59   Pulse 76   Temp 98.1 F (36.7 C) (Temporal)   Ht _0  (1.6 m)   Wt 203 lb (92.1 kg)   SpO2 96%   BMI 35.96 kg/m   Wt Readings from Last 3 Encounters:  02/02/22 203 lb (92.1 kg)  12/23/21 203 lb (92.1 kg)  10/28/21 203 lb 12.8 oz (92.4 kg)    Physical Exam Vitals reviewed.  Constitutional:      General: She is not in acute distress.     Appearance: Normal appearance. She is obese. She is not ill-appearing, toxic-appearing or diaphoretic.  HENT:     Head: Normocephalic and atraumatic.     Right Ear: Tympanic membrane, ear canal and external ear normal. There is no impacted cerumen.     Left Ear: Tympanic membrane, ear canal and external ear normal. There is no impacted cerumen.     Nose: Nose normal. No congestion or rhinorrhea.     Mouth/Throat:     Mouth: Mucous membranes are moist.     Pharynx: Oropharynx is clear. No oropharyngeal exudate or posterior oropharyngeal erythema.  Eyes:     General: No scleral icterus.       Right eye: No discharge.        Left eye: No discharge.     Conjunctiva/sclera: Conjunctivae normal.     Pupils: Pupils are equal, round, and reactive to light.  Cardiovascular:     Rate and Rhythm: Normal rate and regular rhythm.     Heart sounds: Normal heart sounds. No murmur heard.    No friction rub. No gallop.  Pulmonary:     Effort: Pulmonary effort is normal. No respiratory distress.     Breath sounds: Normal breath sounds. No stridor. No wheezing, rhonchi or rales.  Abdominal:     General: Abdomen is flat. Bowel sounds are normal. There is no distension.     Palpations: Abdomen is soft. There is no hepatomegaly, splenomegaly or mass.     Tenderness: There is no abdominal tenderness. There is no guarding or rebound.     Hernia: No hernia is present.  Musculoskeletal:        General: Normal range of motion.     Cervical back: Normal range of motion and neck  supple. No rigidity. No muscular tenderness.  Lymphadenopathy:     Cervical: No cervical adenopathy.  Skin:    General: Skin is warm and dry.     Capillary Refill: Capillary refill takes less than 2 seconds.  Neurological:     General: No focal deficit present.     Mental Status: She is alert and oriented to person, place, and time. Mental status is at baseline.  Psychiatric:        Mood and Affect: Mood normal.        Behavior:  Behavior normal.        Thought Content: Thought content normal.        Judgment: Judgment normal.     Lab Results  Component Value Date   TSH 2.530 07/30/2021   Lab Results  Component Value Date   WBC 6.0 10/28/2021   HGB 10.1 (L) 10/28/2021   HCT 32.1 (L) 10/28/2021   MCV 83 10/28/2021   PLT 304 10/28/2021   Lab Results  Component Value Date   NA 143 10/28/2021   K 4.6 10/28/2021   CO2 24 10/28/2021   GLUCOSE 111 (H) 10/28/2021   BUN 38 (H) 10/28/2021   CREATININE 1.54 (H) 10/28/2021   BILITOT 0.2 10/28/2021   ALKPHOS 122 (H) 10/28/2021   AST 13 10/28/2021   ALT 11 10/28/2021   PROT 6.3 10/28/2021   ALBUMIN 4.4 10/28/2021   CALCIUM 11.0 (H) 10/28/2021   ANIONGAP 7 05/04/2021   EGFR 34 (L) 10/28/2021   GFR 37.55 (L) 10/04/2021   Lab Results  Component Value Date   CHOL 168 10/28/2021   Lab Results  Component Value Date   HDL 56 10/28/2021   Lab Results  Component Value Date   LDLCALC 90 10/28/2021   Lab Results  Component Value Date   TRIG 127 10/28/2021   Lab Results  Component Value Date   CHOLHDL 3.0 10/28/2021   Lab Results  Component Value Date   HGBA1C 5.4 10/28/2021

## 2022-02-03 ENCOUNTER — Ambulatory Visit: Payer: PPO | Admitting: Internal Medicine

## 2022-02-03 LAB — CBC WITH DIFFERENTIAL/PLATELET
Basophils Absolute: 0 10*3/uL (ref 0.0–0.2)
Basos: 0 %
EOS (ABSOLUTE): 0.1 10*3/uL (ref 0.0–0.4)
Eos: 2 %
Hematocrit: 30.1 % — ABNORMAL LOW (ref 34.0–46.6)
Hemoglobin: 9.7 g/dL — ABNORMAL LOW (ref 11.1–15.9)
Immature Grans (Abs): 0.1 10*3/uL (ref 0.0–0.1)
Immature Granulocytes: 1 %
Lymphocytes Absolute: 1.3 10*3/uL (ref 0.7–3.1)
Lymphs: 18 %
MCH: 30.3 pg (ref 26.6–33.0)
MCHC: 32.2 g/dL (ref 31.5–35.7)
MCV: 94 fL (ref 79–97)
Monocytes Absolute: 0.7 10*3/uL (ref 0.1–0.9)
Monocytes: 10 %
Neutrophils Absolute: 4.9 10*3/uL (ref 1.4–7.0)
Neutrophils: 69 %
Platelets: 337 10*3/uL (ref 150–450)
RBC: 3.2 x10E6/uL — ABNORMAL LOW (ref 3.77–5.28)
RDW: 12.4 % (ref 11.7–15.4)
WBC: 7.1 10*3/uL (ref 3.4–10.8)

## 2022-02-03 LAB — CMP14+EGFR
ALT: 9 IU/L (ref 0–32)
AST: 12 IU/L (ref 0–40)
Albumin/Globulin Ratio: 2.2 (ref 1.2–2.2)
Albumin: 4.6 g/dL (ref 3.8–4.8)
Alkaline Phosphatase: 109 IU/L (ref 44–121)
BUN/Creatinine Ratio: 21 (ref 12–28)
BUN: 39 mg/dL — ABNORMAL HIGH (ref 8–27)
Bilirubin Total: 0.2 mg/dL (ref 0.0–1.2)
CO2: 23 mmol/L (ref 20–29)
Calcium: 11.3 mg/dL — ABNORMAL HIGH (ref 8.7–10.3)
Chloride: 101 mmol/L (ref 96–106)
Creatinine, Ser: 1.83 mg/dL — ABNORMAL HIGH (ref 0.57–1.00)
Globulin, Total: 2.1 g/dL (ref 1.5–4.5)
Glucose: 125 mg/dL — ABNORMAL HIGH (ref 70–99)
Potassium: 3.8 mmol/L (ref 3.5–5.2)
Sodium: 141 mmol/L (ref 134–144)
Total Protein: 6.7 g/dL (ref 6.0–8.5)
eGFR: 28 mL/min/{1.73_m2} — ABNORMAL LOW (ref >59)

## 2022-02-07 ENCOUNTER — Other Ambulatory Visit: Payer: Self-pay | Admitting: *Deleted

## 2022-02-07 DIAGNOSIS — M1A9XX0 Chronic gout, unspecified, without tophus (tophi): Secondary | ICD-10-CM

## 2022-02-07 MED ORDER — PANTOPRAZOLE SODIUM 40 MG PO TBEC: 40.0000 mg | DELAYED_RELEASE_TABLET | Freq: Two times a day (BID) | ORAL | 1 refills | Status: DC

## 2022-02-07 MED ORDER — OXYBUTYNIN CHLORIDE ER 10 MG PO TB24: 10.0000 mg | ORAL_TABLET | Freq: Every day | ORAL | 1 refills | Status: DC

## 2022-02-07 MED ORDER — ALLOPURINOL 100 MG PO TABS: 100.0000 mg | ORAL_TABLET | Freq: Every day | ORAL | 1 refills | Status: DC

## 2022-02-09 ENCOUNTER — Emergency Department (HOSPITAL_COMMUNITY): Payer: PPO

## 2022-02-09 ENCOUNTER — Encounter (HOSPITAL_COMMUNITY): Payer: Self-pay

## 2022-02-09 ENCOUNTER — Other Ambulatory Visit: Payer: Self-pay

## 2022-02-09 ENCOUNTER — Inpatient Hospital Stay (HOSPITAL_COMMUNITY)
Admission: EM | Admit: 2022-02-09 | Discharge: 2022-02-12 | DRG: 871 | Disposition: A | Discharge status: Home / Self Care | Payer: PPO | Attending: Internal Medicine | Admitting: Internal Medicine

## 2022-02-09 DIAGNOSIS — Z66 Do not resuscitate: Secondary | ICD-10-CM | POA: Diagnosis present

## 2022-02-09 DIAGNOSIS — E039 Hypothyroidism, unspecified: Secondary | ICD-10-CM | POA: Diagnosis not present

## 2022-02-09 DIAGNOSIS — E785 Hyperlipidemia, unspecified: Secondary | ICD-10-CM | POA: Diagnosis present

## 2022-02-09 DIAGNOSIS — Z7901 Long term (current) use of anticoagulants: Secondary | ICD-10-CM

## 2022-02-09 DIAGNOSIS — K219 Gastro-esophageal reflux disease without esophagitis: Secondary | ICD-10-CM | POA: Diagnosis present

## 2022-02-09 DIAGNOSIS — A419 Sepsis, unspecified organism: Principal | ICD-10-CM

## 2022-02-09 DIAGNOSIS — M19041 Primary osteoarthritis, right hand: Secondary | ICD-10-CM | POA: Diagnosis present

## 2022-02-09 DIAGNOSIS — Z7984 Long term (current) use of oral hypoglycemic drugs: Secondary | ICD-10-CM

## 2022-02-09 DIAGNOSIS — E119 Type 2 diabetes mellitus without complications: Secondary | ICD-10-CM | POA: Diagnosis present

## 2022-02-09 DIAGNOSIS — E8809 Other disorders of plasma-protein metabolism, not elsewhere classified: Secondary | ICD-10-CM | POA: Diagnosis present

## 2022-02-09 DIAGNOSIS — M19042 Primary osteoarthritis, left hand: Secondary | ICD-10-CM | POA: Diagnosis present

## 2022-02-09 DIAGNOSIS — N179 Acute kidney failure, unspecified: Secondary | ICD-10-CM | POA: Diagnosis present

## 2022-02-09 DIAGNOSIS — Z87442 Personal history of urinary calculi: Secondary | ICD-10-CM

## 2022-02-09 DIAGNOSIS — I482 Chronic atrial fibrillation, unspecified: Secondary | ICD-10-CM | POA: Diagnosis present

## 2022-02-09 DIAGNOSIS — M170 Bilateral primary osteoarthritis of knee: Secondary | ICD-10-CM | POA: Diagnosis present

## 2022-02-09 DIAGNOSIS — Z85828 Personal history of other malignant neoplasm of skin: Secondary | ICD-10-CM

## 2022-02-09 DIAGNOSIS — E892 Postprocedural hypoparathyroidism: Secondary | ICD-10-CM | POA: Diagnosis present

## 2022-02-09 DIAGNOSIS — I10 Essential (primary) hypertension: Secondary | ICD-10-CM | POA: Diagnosis present

## 2022-02-09 DIAGNOSIS — J189 Pneumonia, unspecified organism: Secondary | ICD-10-CM

## 2022-02-09 DIAGNOSIS — Z96641 Presence of right artificial hip joint: Secondary | ICD-10-CM | POA: Diagnosis present

## 2022-02-09 DIAGNOSIS — R6520 Severe sepsis without septic shock: Secondary | ICD-10-CM | POA: Diagnosis present

## 2022-02-09 DIAGNOSIS — Z79899 Other long term (current) drug therapy: Secondary | ICD-10-CM

## 2022-02-09 DIAGNOSIS — R509 Fever, unspecified: Secondary | ICD-10-CM | POA: Diagnosis not present

## 2022-02-09 DIAGNOSIS — E876 Hypokalemia: Secondary | ICD-10-CM

## 2022-02-09 DIAGNOSIS — Z6837 Body mass index (BMI) 37.0-37.9, adult: Secondary | ICD-10-CM

## 2022-02-09 DIAGNOSIS — E669 Obesity, unspecified: Secondary | ICD-10-CM | POA: Diagnosis present

## 2022-02-09 DIAGNOSIS — Z79890 Hormone replacement therapy (postmenopausal): Secondary | ICD-10-CM

## 2022-02-09 DIAGNOSIS — M160 Bilateral primary osteoarthritis of hip: Secondary | ICD-10-CM | POA: Diagnosis present

## 2022-02-09 DIAGNOSIS — I152 Hypertension secondary to endocrine disorders: Secondary | ICD-10-CM | POA: Diagnosis present

## 2022-02-09 DIAGNOSIS — Z86711 Personal history of pulmonary embolism: Secondary | ICD-10-CM

## 2022-02-09 DIAGNOSIS — J9601 Acute respiratory failure with hypoxia: Secondary | ICD-10-CM | POA: Diagnosis present

## 2022-02-09 DIAGNOSIS — Z90710 Acquired absence of both cervix and uterus: Secondary | ICD-10-CM

## 2022-02-09 DIAGNOSIS — Z20822 Contact with and (suspected) exposure to covid-19: Secondary | ICD-10-CM | POA: Diagnosis present

## 2022-02-09 DIAGNOSIS — Z8249 Family history of ischemic heart disease and other diseases of the circulatory system: Secondary | ICD-10-CM

## 2022-02-09 LAB — CBC WITH DIFFERENTIAL/PLATELET
Abs Immature Granulocytes: 0.13 10*3/uL — ABNORMAL HIGH (ref 0.00–0.07)
Basophils Absolute: 0 10*3/uL (ref 0.0–0.1)
Basophils Relative: 0 %
Eosinophils Absolute: 0 10*3/uL (ref 0.0–0.5)
Eosinophils Relative: 0 %
HCT: 26.1 % — ABNORMAL LOW (ref 36.0–46.0)
Hemoglobin: 8.1 g/dL — ABNORMAL LOW (ref 12.0–15.0)
Immature Granulocytes: 1 %
Lymphocytes Relative: 7 %
Lymphs Abs: 0.7 10*3/uL (ref 0.7–4.0)
MCH: 30.7 pg (ref 26.0–34.0)
MCHC: 31 g/dL (ref 30.0–36.0)
MCV: 98.9 fL (ref 80.0–100.0)
Monocytes Absolute: 0.8 10*3/uL (ref 0.1–1.0)
Monocytes Relative: 8 %
Neutro Abs: 9.2 10*3/uL — ABNORMAL HIGH (ref 1.7–7.7)
Neutrophils Relative %: 84 %
Platelets: 189 10*3/uL (ref 150–400)
RBC: 2.64 MIL/uL — ABNORMAL LOW (ref 3.87–5.11)
RDW: 14.3 % (ref 11.5–15.5)
WBC: 10.8 10*3/uL — ABNORMAL HIGH (ref 4.0–10.5)
nRBC: 0 % (ref 0.0–0.2)

## 2022-02-09 LAB — PROTIME-INR
INR: 1.4 — ABNORMAL HIGH (ref 0.8–1.2)
Prothrombin Time: 17 seconds — ABNORMAL HIGH (ref 11.4–15.2)

## 2022-02-09 LAB — RESP PANEL BY RT-PCR (FLU A&B, COVID) ARPGX2
Influenza A by PCR: NEGATIVE
Influenza B by PCR: NEGATIVE
SARS Coronavirus 2 by RT PCR: NEGATIVE

## 2022-02-09 LAB — LACTIC ACID, PLASMA
Lactic Acid, Venous: 2.1 mmol/L (ref 0.5–1.9)
Lactic Acid, Venous: 1.1 mmol/L (ref 0.5–1.9)

## 2022-02-09 LAB — COMPREHENSIVE METABOLIC PANEL
ALT: 13 U/L (ref 0–44)
AST: 15 U/L (ref 15–41)
Albumin: 3.4 g/dL — ABNORMAL LOW (ref 3.5–5.0)
Alkaline Phosphatase: 80 U/L (ref 38–126)
Anion gap: 10 (ref 5–15)
BUN: 28 mg/dL — ABNORMAL HIGH (ref 8–23)
CO2: 24 mmol/L (ref 22–32)
Calcium: 9.7 mg/dL (ref 8.9–10.3)
Chloride: 102 mmol/L (ref 98–111)
Creatinine, Ser: 1.6 mg/dL — ABNORMAL HIGH (ref 0.44–1.00)
GFR, Estimated: 32 mL/min — ABNORMAL LOW (ref >60)
Glucose, Bld: 136 mg/dL — ABNORMAL HIGH (ref 70–99)
Potassium: 2.3 mmol/L — CL (ref 3.5–5.1)
Sodium: 136 mmol/L (ref 135–145)
Total Bilirubin: 0.6 mg/dL (ref 0.3–1.2)
Total Protein: 5.9 g/dL — ABNORMAL LOW (ref 6.5–8.1)

## 2022-02-09 LAB — APTT: aPTT: 32 seconds (ref 24–36)

## 2022-02-09 LAB — MAGNESIUM: Magnesium: 1.6 mg/dL — ABNORMAL LOW (ref 1.7–2.4)

## 2022-02-09 MED ORDER — SODIUM CHLORIDE 0.9 % IV BOLUS (SEPSIS)
1000.0000 mL | Freq: Once | INTRAVENOUS | Status: AC
  Administered 2022-02-09: 1000 mL via INTRAVENOUS

## 2022-02-09 MED ORDER — POTASSIUM CHLORIDE 10 MEQ/100ML IV SOLN
10.0000 meq | INTRAVENOUS | Status: AC
  Administered 2022-02-09 – 2022-02-10 (×4): 10 meq via INTRAVENOUS
  Filled 2022-02-09 (×4): qty 100

## 2022-02-09 MED ORDER — SODIUM CHLORIDE 0.9 % IV SOLN
1.0000 g | Freq: Once | INTRAVENOUS | Status: AC
  Administered 2022-02-09: 1 g via INTRAVENOUS
  Filled 2022-02-09: qty 10

## 2022-02-09 MED ORDER — LACTATED RINGERS IV SOLN
INTRAVENOUS | Status: AC
Start: 2022-02-09 — End: 2022-02-10

## 2022-02-09 MED ORDER — SODIUM CHLORIDE 0.9 % IV SOLN: 2.0000 g | INTRAVENOUS | Status: DC

## 2022-02-09 MED ORDER — ACETAMINOPHEN 325 MG PO TABS
650.0000 mg | ORAL_TABLET | Freq: Once | ORAL | Status: AC
  Administered 2022-02-09: 650 mg via ORAL
  Filled 2022-02-09: qty 2

## 2022-02-09 MED ORDER — SODIUM CHLORIDE 0.9 % IV SOLN
1.0000 g | INTRAVENOUS | Status: DC
  Administered 2022-02-10 – 2022-02-11 (×2): 1 g via INTRAVENOUS
  Filled 2022-02-09 (×2): qty 10

## 2022-02-09 MED ORDER — SODIUM CHLORIDE 0.9 % IV SOLN
500.0000 mg | INTRAVENOUS | Status: DC
  Administered 2022-02-09 – 2022-02-11 (×3): 500 mg via INTRAVENOUS
  Filled 2022-02-09 (×3): qty 5

## 2022-02-09 NOTE — ED Provider Notes (Signed)
[facility] Provider Note   CSN: [id number] Arrival date & time: 02/09/22  1832     History  Chief Complaint  Patient presents with   Code Sepsis    [name] [name] is a 81 y.o. female.  HPI     81 year old female comes in with chief complaint of chills and weakness.  Patient brought to the ER from her home.  Patient has been having flulike symptoms and having chills.  She indicates that 10 days ago she had sore throat, cold -which have resolved, but the cough has persisted.  Cough is producing now green phlegm.  Patient denies any headache, neck pain, sore throat, abdominal pain, UTI-like symptoms, rash.  She does indicate that she has been weaker than usual and has shortness of breath with exertion.  Patient did not know that she had a fever.  EMS gave patient 1 g of ceftriaxone in route.  Home Medications Prior to Admission medications   Medication Sig Start Date End Date Taking? Authorizing Provider  acetaminophen (TYLENOL) 325 MG tablet Take 2 tablets (650 mg total) by mouth every 6 (six) hours as needed for mild pain or headache (or Fever >/= 101). 04/04/19  Yes Emokpae, Courage, MD  allopurinol (ZYLOPRIM) 100 MG tablet Take 1 tablet (100 mg total) by mouth daily. 02/07/22  Yes [name], FNP  amLODipine (NORVASC) 10 MG tablet Take 1 tablet (10 mg total) by mouth daily. 08/03/21 02/09/22 Yes Minus Breeding, MD  apixaban (ELIQUIS) 2.5 MG TABS tablet Take 1 tablet (2.5 mg total) by mouth 2 (two) times daily. 02/02/22  Yes [name] F, FNP  atorvastatin (LIPITOR) 20 MG tablet Take 1 tablet (20 mg total) by mouth daily. 10/28/21  Yes [name] F, FNP  dapagliflozin propanediol (FARXIGA) 10 MG TABS tablet Take 1 tablet (10 mg total) by mouth daily. 10/30/21  Yes [name] F, FNP  furosemide (LASIX) 20 MG tablet Take 1 tablet (20 mg total) by mouth daily. 07/20/21  Yes Minus Breeding, MD  GNP CRANBERRY EXTRACT PO Take 500 mg by mouth in  the morning.   Yes [provider]  hydrochlorothiazide (HYDRODIURIL) 12.5 MG tablet Take 1 tablet (12.5 mg total) by mouth daily. 10/07/21  Yes Shamleffer, Melanie Crazier, MD  levothyroxine (SYNTHROID) 50 MCG tablet TAKE 1 TABLET BY MOUTH ONCE DAILY BEFORE BREAKFAST 08/23/21  Yes Loman Brooklyn, FNP  loratadine (CLARITIN) 10 MG tablet Take 10 mg by mouth daily.   Yes [provider]  oxybutynin (DITROPAN-XL) 10 MG 24 hr tablet Take 1 tablet (10 mg total) by mouth at bedtime. 02/07/22  Yes [name], FNP  pantoprazole (PROTONIX) 40 MG tablet Take 1 tablet (40 mg total) by mouth 2 (two) times daily before a meal. 02/07/22  Yes [name], FNP  vitamin B-12 (CYANOCOBALAMIN) 50 MCG tablet Take 50 mcg by mouth daily.   Yes [provider]  Vitamin D3 (VITAMIN D) 25 MCG tablet Take 1,000 Units by mouth daily.   Yes [provider]  ONE TOUCH ULTRA TEST test strip USE UP TO FOUR TIMES DAILY AS DIRECTED Patient taking differently: 1 each by Other route 4 (four) times daily. 10/04/17   Terald Sleeper, PA-C      Allergies    Patient has no known allergies.    Review of Systems   Review of Systems  All other systems reviewed and are negative.   Physical Exam Updated Vital Signs BP (!) 131/33  Pulse 76   Temp 98.4 F (36.9 C) (Axillary)   Resp (!) 27   Ht '5\' 3"'$  (1.6 m)   Wt 92 kg   SpO2 95%   BMI 35.93 kg/m  Physical Exam Vitals and nursing note reviewed.  Constitutional:      Appearance: She is well-developed.  HENT:     Head: Atraumatic.  Eyes:     Extraocular Movements: Extraocular movements intact.     Pupils: Pupils are equal, round, and reactive to light.  Cardiovascular:     Rate and Rhythm: Normal rate.  Pulmonary:     Effort: Pulmonary effort is normal.     Breath sounds: No wheezing, rhonchi or rales.  Chest:     Chest wall: No tenderness.  Abdominal:     Tenderness: There is no abdominal tenderness.  Musculoskeletal:         General: No swelling.     Cervical back: Normal range of motion and neck supple.     Right lower leg: No edema.     Left lower leg: No edema.  Skin:    General: Skin is warm and dry.  Neurological:     Mental Status: She is alert and oriented to person, place, and time.     ED Results / Procedures / Treatments   Labs (all labs ordered are listed, but only abnormal results are displayed) Labs Reviewed  COMPREHENSIVE METABOLIC PANEL - Abnormal; Notable for the following components:      Result Value   Potassium 2.3 (*)    Glucose, Bld 136 (*)    BUN 28 (*)    Creatinine, Ser 1.60 (*)    Total Protein 5.9 (*)    Albumin 3.4 (*)    GFR, Estimated 32 (*)    All other components within normal limits  LACTIC ACID, PLASMA - Abnormal; Notable for the following components:   Lactic Acid, Venous 2.1 (*)    All other components within normal limits  CBC WITH DIFFERENTIAL/PLATELET - Abnormal; Notable for the following components:   WBC 10.8 (*)    RBC 2.64 (*)    Hemoglobin 8.1 (*)    HCT 26.1 (*)    Neutro Abs 9.2 (*)    Abs Immature Granulocytes 0.13 (*)    All other components within normal limits  PROTIME-INR - Abnormal; Notable for the following components:   Prothrombin Time 17.0 (*)    INR 1.4 (*)    All other components within normal limits  URINALYSIS, ROUTINE W REFLEX MICROSCOPIC - Abnormal; Notable for the following components:   Glucose, UA >=500 (*)    Hgb urine dipstick MODERATE (*)    Protein, ur 30 (*)    Nitrite POSITIVE (*)    Leukocytes,Ua MODERATE (*)    WBC, UA >50 (*)    Bacteria, UA RARE (*)    All other components within normal limits  MAGNESIUM - Abnormal; Notable for the following components:   Magnesium 1.6 (*)    All other components within normal limits  PROTIME-INR - Abnormal; Notable for the following components:   Prothrombin Time 18.6 (*)    INR 1.6 (*)    All other components within normal limits  COMPREHENSIVE METABOLIC PANEL -  Abnormal; Notable for the following components:   Glucose, Bld 113 (*)    BUN 28 (*)    Creatinine, Ser 1.45 (*)    Total Protein 5.3 (*)    Albumin 2.9 (*)    AST 13 (*)  GFR, Estimated 36 (*)    All other components within normal limits  CBC WITH DIFFERENTIAL/PLATELET - Abnormal; Notable for the following components:   WBC 16.0 (*)    RBC 2.42 (*)    Hemoglobin 7.2 (*)    HCT 24.7 (*)    MCV 102.1 (*)    MCHC 29.1 (*)    Neutro Abs 12.5 (*)    Monocytes Absolute 1.7 (*)    Abs Immature Granulocytes 0.13 (*)    All other components within normal limits  CULTURE, BLOOD (ROUTINE X 2)  CULTURE, BLOOD (ROUTINE X 2)  RESP PANEL BY RT-PCR (FLU A&B, COVID) ARPGX2  MRSA NEXT GEN BY PCR, NASAL  URINE CULTURE  EXPECTORATED SPUTUM ASSESSMENT W GRAM STAIN, RFLX TO RESP C  LACTIC ACID, PLASMA  APTT  CORTISOL-AM, BLOOD  PROCALCITONIN  MAGNESIUM  TSH  STREP PNEUMONIAE URINARY ANTIGEN  LEGIONELLA PNEUMOPHILA SEROGP 1 UR AG    EKG EKG Interpretation  Date/Time:  Wednesday February 09 2022 20:16:46 EDT Ventricular Rate:  95 PR Interval:  163 QRS Duration: 111 QT Interval:  384 QTC Calculation: 483 R Axis:   -18 Text Interpretation: Sinus rhythm LVH with secondary repolarization abnormality No acute changes No significant change since last tracing Confirmed by Varney Biles ([id number]) on 02/09/2022 8:41:05 PM  Radiology DG Chest Port 1 View  Result Date: 02/09/2022 CLINICAL DATA:  Sepsis EXAM: PORTABLE CHEST 1 VIEW COMPARISON:  02/10/2021 FINDINGS: Single frontal view of the chest demonstrates an unremarkable cardiac silhouette. No airspace disease, effusion, or pneumothorax. No acute bony abnormalities. IMPRESSION: 1. No acute intrathoracic process. Electronically Signed   By: Randa Ngo M.D.   On: 02/09/2022 19:22    Procedures .Critical Care  Performed by: Varney Biles, MD Authorized by: Varney Biles, MD   Critical care provider statement:    Critical care time  (minutes):  38   Critical care was necessary to treat or prevent imminent or life-threatening deterioration of the following conditions:  Sepsis and respiratory failure   Critical care was time spent personally by me on the following activities:  Development of treatment plan with patient or surrogate, discussions with consultants, evaluation of patient's response to treatment, examination of patient, ordering and review of laboratory studies, ordering and review of radiographic studies, ordering and performing treatments and interventions, pulse oximetry, re-evaluation of patient's condition and review of old charts     Medications Ordered in ED Medications  lactated ringers infusion ( Intravenous IV Pump [employer] 02/10/22 0822)  azithromycin (ZITHROMAX) 500 mg in sodium chloride 0.9 % 250 mL IVPB (0 mg Intravenous Stopped 02/09/22 2140)  cefTRIAXone (ROCEPHIN) 1 g in sodium chloride 0.9 % 100 mL IVPB (has no administration in time range)  atorvastatin (LIPITOR) tablet 20 mg (20 mg Oral Given 02/10/22 0951)  levothyroxine (SYNTHROID) tablet 50 mcg (50 mcg Oral Given 02/10/22 0606)  pantoprazole (PROTONIX) EC tablet 40 mg (40 mg Oral Given 02/10/22 0822)  apixaban (ELIQUIS) tablet 2.5 mg (2.5 mg Oral Given 02/10/22 0951)  vitamin B-12 (CYANOCOBALAMIN) tablet 50 mcg (50 mcg Oral Given 02/10/22 0951)  loratadine (CLARITIN) tablet 10 mg (10 mg Oral Given 02/10/22 0951)  acetaminophen (TYLENOL) tablet 650 mg (has no administration in time range)    Or  acetaminophen (TYLENOL) suppository 650 mg (has no administration in time range)  oxyCODONE (Oxy IR/ROXICODONE) immediate release tablet 5 mg (has no administration in time range)  ondansetron (ZOFRAN) tablet 4 mg (has no administration in time range)    Or  ondansetron (ZOFRAN) injection 4 mg (has no administration in time range)  dextromethorphan-guaiFENesin (MUCINEX DM) 30-600 MG per 12 hr tablet 1 tablet (1 tablet Oral Given 02/10/22 0952)   ipratropium-albuterol (DUONEB) 0.5-2.5 (3) MG/3ML nebulizer solution 3 mL (3 mLs Nebulization Given 02/10/22 1155)  albuterol (PROVENTIL) (2.5 MG/3ML) 0.083% nebulizer solution 2.5 mg (has no administration in time range)  Chlorhexidine Gluconate Cloth 2 % PADS 6 each (6 each Topical Given 02/10/22 0952)  bisacodyl (DULCOLAX) suppository 10 mg (has no administration in time range)  polyethylene glycol (MIRALAX / GLYCOLAX) packet 17 g (has no administration in time range)  docusate sodium (COLACE) capsule 100 mg (100 mg Oral Given 02/10/22 1349)  Oral care mouth rinse (has no administration in time range)  sodium chloride 0.9 % bolus 1,000 mL (0 mLs Intravenous Stopped 02/09/22 2135)  acetaminophen (TYLENOL) tablet 650 mg (650 mg Oral Given 02/09/22 2034)  cefTRIAXone (ROCEPHIN) 1 g in sodium chloride 0.9 % 100 mL IVPB (0 g Intravenous Stopped 02/09/22 2230)  potassium chloride 10 mEq in 100 mL IVPB (0 mEq Intravenous Stopped 02/10/22 0220)  potassium chloride (KLOR-CON) packet 40 mEq (40 mEq Oral Given 02/10/22 0214)  magnesium sulfate IVPB 2 g 50 mL (0 g Intravenous Stopped 02/10/22 0457)    ED Course/ Medical Decision Making/ A&P                           Medical Decision Making Amount and/or Complexity of Data Reviewed Labs: ordered. ECG/medicine tests: ordered.  Risk OTC drugs. Prescription drug management. Decision regarding hospitalization.   This patient presents to the ED with chief complaint(s) of, fever, generalized weakness with pertinent past medical history of hypertension, heart valve disorder, chronic A-fib which further complicates the presenting complaint. The complaint involves an extensive differential diagnosis and also carries with it a high risk of complications and morbidity.    Patient noted to have tachycardia, fever and mild tachypnea.  She is also on oxygen. Patient received 1 g of ceftriaxone and 1000 mg of Tylenol prior to ED arrival.  Patient states that she  was having sore throat and URI-like symptoms about 10 days ago.  The differential diagnosis includes COVID-19, superimposed bacterial pneumonia, bacteremia, severe sepsis with organ dysfunction, renal failure, severe electrolyte abnormality.  The initial plan is to initiate sepsis work-up and give patient 1 g of ceftriaxone and azithromycin.   Additional history obtained: Additional history obtained from family   Independent labs interpretation:  The following labs were independently interpreted: Slight elevation in white count.   Independent visualization of imaging: - I independently visualized the following imaging with scope of interpretation limited to determining acute life threatening conditions related to emergency care: X-ray of the chest, which revealed no clear evidence of pneumonia, pleural effusion  Patient reassessed.  Confirms a story of cough producing green phlegm with chills that started today.  PE is in the differential, but clinical suspicion for pneumonia is higher and we will admit patient with antibiotics.  CT PE can be ordered if needed.  Treatment and Reassessment: Patient's blood pressure has been borderline low.  She clinically is not in septic shock right now, we will continue to check her blood pressure closely.   Final Clinical Impression(s) / ED Diagnoses Final diagnoses:  Acute hypoxemic respiratory failure ([place])  Severe sepsis ([place])    Rx / DC Orders ED Discharge Orders     None  Varney Biles, MD 02/10/22 1520

## 2022-02-09 NOTE — ED Notes (Signed)
Notified Dr Kathrynn Humble of critical Potassium of 2.3

## 2022-02-09 NOTE — ED Triage Notes (Signed)
Pt to ED from home via [facility] after being sick with cold-like symptoms for 10 days. On EMS arrival, pt febrile at 103.2, tachy at 130s. EMS gave '1000mg'$  tylenol, 1055m NaCl bolus, and 1g of rocephin.

## 2022-02-10 DIAGNOSIS — E8809 Other disorders of plasma-protein metabolism, not elsewhere classified: Secondary | ICD-10-CM

## 2022-02-10 DIAGNOSIS — R6520 Severe sepsis without septic shock: Secondary | ICD-10-CM

## 2022-02-10 DIAGNOSIS — J9601 Acute respiratory failure with hypoxia: Secondary | ICD-10-CM | POA: Diagnosis not present

## 2022-02-10 DIAGNOSIS — E039 Hypothyroidism, unspecified: Secondary | ICD-10-CM

## 2022-02-10 DIAGNOSIS — E785 Hyperlipidemia, unspecified: Secondary | ICD-10-CM

## 2022-02-10 DIAGNOSIS — E876 Hypokalemia: Secondary | ICD-10-CM

## 2022-02-10 DIAGNOSIS — I482 Chronic atrial fibrillation, unspecified: Secondary | ICD-10-CM

## 2022-02-10 DIAGNOSIS — J189 Pneumonia, unspecified organism: Secondary | ICD-10-CM | POA: Diagnosis not present

## 2022-02-10 DIAGNOSIS — A419 Sepsis, unspecified organism: Secondary | ICD-10-CM | POA: Diagnosis not present

## 2022-02-10 DIAGNOSIS — K219 Gastro-esophageal reflux disease without esophagitis: Secondary | ICD-10-CM

## 2022-02-10 DIAGNOSIS — I10 Essential (primary) hypertension: Secondary | ICD-10-CM

## 2022-02-10 LAB — URINALYSIS, ROUTINE W REFLEX MICROSCOPIC
Bilirubin Urine: NEGATIVE
Glucose, UA: >=500 mg/dL — AB
Ketones, ur: NEGATIVE mg/dL
Nitrite: POSITIVE — AB
Protein, ur: 30 mg/dL — AB
Specific Gravity, Urine: 1.007 (ref 1.005–1.030)
WBC, UA: >50 WBC/hpf — ABNORMAL HIGH (ref 0–5)
pH: 5 (ref 5.0–8.0)

## 2022-02-10 LAB — COMPREHENSIVE METABOLIC PANEL
ALT: 11 U/L (ref 0–44)
AST: 13 U/L — ABNORMAL LOW (ref 15–41)
Albumin: 2.9 g/dL — ABNORMAL LOW (ref 3.5–5.0)
Alkaline Phosphatase: 69 U/L (ref 38–126)
Anion gap: 8 (ref 5–15)
BUN: 28 mg/dL — ABNORMAL HIGH (ref 8–23)
CO2: 24 mmol/L (ref 22–32)
Calcium: 9.2 mg/dL (ref 8.9–10.3)
Chloride: 105 mmol/L (ref 98–111)
Creatinine, Ser: 1.45 mg/dL — ABNORMAL HIGH (ref 0.44–1.00)
GFR, Estimated: 36 mL/min — ABNORMAL LOW (ref >60)
Glucose, Bld: 113 mg/dL — ABNORMAL HIGH (ref 70–99)
Potassium: 3.6 mmol/L (ref 3.5–5.1)
Sodium: 137 mmol/L (ref 135–145)
Total Bilirubin: 0.4 mg/dL (ref 0.3–1.2)
Total Protein: 5.3 g/dL — ABNORMAL LOW (ref 6.5–8.1)

## 2022-02-10 LAB — CBC WITH DIFFERENTIAL/PLATELET
Abs Immature Granulocytes: 0.13 10*3/uL — ABNORMAL HIGH (ref 0.00–0.07)
Basophils Absolute: 0 10*3/uL (ref 0.0–0.1)
Basophils Relative: 0 %
Eosinophils Absolute: 0 10*3/uL (ref 0.0–0.5)
Eosinophils Relative: 0 %
HCT: 24.7 % — ABNORMAL LOW (ref 36.0–46.0)
Hemoglobin: 7.2 g/dL — ABNORMAL LOW (ref 12.0–15.0)
Immature Granulocytes: 1 %
Lymphocytes Relative: 10 %
Lymphs Abs: 1.6 10*3/uL (ref 0.7–4.0)
MCH: 29.8 pg (ref 26.0–34.0)
MCHC: 29.1 g/dL — ABNORMAL LOW (ref 30.0–36.0)
MCV: 102.1 fL — ABNORMAL HIGH (ref 80.0–100.0)
Monocytes Absolute: 1.7 10*3/uL — ABNORMAL HIGH (ref 0.1–1.0)
Monocytes Relative: 11 %
Neutro Abs: 12.5 10*3/uL — ABNORMAL HIGH (ref 1.7–7.7)
Neutrophils Relative %: 78 %
Platelets: 174 10*3/uL (ref 150–400)
RBC: 2.42 MIL/uL — ABNORMAL LOW (ref 3.87–5.11)
RDW: 14.1 % (ref 11.5–15.5)
WBC: 16 10*3/uL — ABNORMAL HIGH (ref 4.0–10.5)
nRBC: 0 % (ref 0.0–0.2)

## 2022-02-10 LAB — PROTIME-INR
INR: 1.6 — ABNORMAL HIGH (ref 0.8–1.2)
Prothrombin Time: 18.6 seconds — ABNORMAL HIGH (ref 11.4–15.2)

## 2022-02-10 LAB — STREP PNEUMONIAE URINARY ANTIGEN: Strep Pneumo Urinary Antigen: NEGATIVE

## 2022-02-10 LAB — PROCALCITONIN: Procalcitonin: 136.48 ng/mL

## 2022-02-10 LAB — CORTISOL-AM, BLOOD: Cortisol - AM: 15.1 ug/dL (ref 6.7–22.6)

## 2022-02-10 LAB — TSH: TSH: 0.984 u[IU]/mL (ref 0.350–4.500)

## 2022-02-10 LAB — MRSA NEXT GEN BY PCR, NASAL: MRSA by PCR Next Gen: NOT DETECTED

## 2022-02-10 LAB — MAGNESIUM: Magnesium: 2.3 mg/dL (ref 1.7–2.4)

## 2022-02-10 MED ORDER — ONDANSETRON HCL 4 MG/2ML IJ SOLN: 4.0000 mg | Freq: Four times a day (QID) | INTRAMUSCULAR | Status: DC | PRN

## 2022-02-10 MED ORDER — OXYCODONE HCL 5 MG PO TABS
5.0000 mg | ORAL_TABLET | ORAL | Status: DC | PRN
  Filled 2022-02-10: qty 1

## 2022-02-10 MED ORDER — ATORVASTATIN CALCIUM 20 MG PO TABS
20.0000 mg | ORAL_TABLET | Freq: Every day | ORAL | Status: DC
  Administered 2022-02-10 – 2022-02-12 (×3): 20 mg via ORAL
  Filled 2022-02-10 (×3): qty 1

## 2022-02-10 MED ORDER — APIXABAN 2.5 MG PO TABS
2.5000 mg | ORAL_TABLET | Freq: Two times a day (BID) | ORAL | Status: DC
  Administered 2022-02-10 – 2022-02-12 (×6): 2.5 mg via ORAL
  Filled 2022-02-10 (×6): qty 1

## 2022-02-10 MED ORDER — LEVOTHYROXINE SODIUM 50 MCG PO TABS
50.0000 ug | ORAL_TABLET | Freq: Every day | ORAL | Status: DC
  Administered 2022-02-10 – 2022-02-12 (×3): 50 ug via ORAL
  Filled 2022-02-10: qty 1
  Filled 2022-02-10: qty 2
  Filled 2022-02-10: qty 1

## 2022-02-10 MED ORDER — DOCUSATE SODIUM 100 MG PO CAPS
100.0000 mg | ORAL_CAPSULE | Freq: Two times a day (BID) | ORAL | Status: DC
  Administered 2022-02-10 – 2022-02-12 (×4): 100 mg via ORAL
  Filled 2022-02-10 (×5): qty 1

## 2022-02-10 MED ORDER — ORAL CARE MOUTH RINSE
15.0000 mL | OROMUCOSAL | Status: DC | PRN
Start: 2022-02-10 — End: 2022-02-12

## 2022-02-10 MED ORDER — VITAMIN B-12 100 MCG PO TABS
50.0000 ug | ORAL_TABLET | Freq: Every day | ORAL | Status: DC
  Administered 2022-02-10 – 2022-02-12 (×3): 50 ug via ORAL
  Filled 2022-02-10 (×3): qty 1

## 2022-02-10 MED ORDER — POLYETHYLENE GLYCOL 3350 17 G PO PACK
17.0000 g | PACK | Freq: Every day | ORAL | Status: DC | PRN
Start: 2022-02-10 — End: 2022-02-12
  Administered 2022-02-11: 17 g via ORAL
  Filled 2022-02-10 (×3): qty 1

## 2022-02-10 MED ORDER — BISACODYL 10 MG RE SUPP
10.0000 mg | Freq: Every day | RECTAL | Status: DC | PRN
  Administered 2022-02-12: 10 mg via RECTAL
  Filled 2022-02-10: qty 1

## 2022-02-10 MED ORDER — POTASSIUM CHLORIDE 20 MEQ PO PACK
40.0000 meq | PACK | Freq: Once | ORAL | Status: AC
  Administered 2022-02-10: 40 meq via ORAL
  Filled 2022-02-10: qty 2

## 2022-02-10 MED ORDER — ACETAMINOPHEN 325 MG PO TABS: 650.0000 mg | ORAL_TABLET | Freq: Four times a day (QID) | ORAL | Status: DC | PRN

## 2022-02-10 MED ORDER — ALBUTEROL SULFATE (2.5 MG/3ML) 0.083% IN NEBU
2.5000 mg | INHALATION_SOLUTION | RESPIRATORY_TRACT | Status: DC | PRN
Start: 2022-02-10 — End: 2022-02-10

## 2022-02-10 MED ORDER — ACETAMINOPHEN 650 MG RE SUPP: 650.0000 mg | Freq: Four times a day (QID) | RECTAL | Status: DC | PRN

## 2022-02-10 MED ORDER — DM-GUAIFENESIN ER 30-600 MG PO TB12
1.0000 | ORAL_TABLET | Freq: Two times a day (BID) | ORAL | Status: DC
  Administered 2022-02-10 – 2022-02-12 (×5): 1 via ORAL
  Filled 2022-02-10 (×5): qty 1

## 2022-02-10 MED ORDER — IPRATROPIUM-ALBUTEROL 0.5-2.5 (3) MG/3ML IN SOLN
3.0000 mL | Freq: Two times a day (BID) | RESPIRATORY_TRACT | Status: DC
  Administered 2022-02-10 – 2022-02-12 (×5): 3 mL via RESPIRATORY_TRACT
  Filled 2022-02-10 (×5): qty 3

## 2022-02-10 MED ORDER — PANTOPRAZOLE SODIUM 40 MG PO TBEC
40.0000 mg | DELAYED_RELEASE_TABLET | Freq: Two times a day (BID) | ORAL | Status: DC
  Administered 2022-02-10 – 2022-02-12 (×5): 40 mg via ORAL
  Filled 2022-02-10 (×5): qty 1

## 2022-02-10 MED ORDER — MAGNESIUM SULFATE 2 GM/50ML IV SOLN
2.0000 g | Freq: Once | INTRAVENOUS | Status: AC
  Administered 2022-02-10: 2 g via INTRAVENOUS
  Filled 2022-02-10: qty 50

## 2022-02-10 MED ORDER — ONDANSETRON HCL 4 MG PO TABS: 4.0000 mg | ORAL_TABLET | Freq: Four times a day (QID) | ORAL | Status: DC | PRN

## 2022-02-10 MED ORDER — ALBUTEROL SULFATE (2.5 MG/3ML) 0.083% IN NEBU: 2.5000 mg | INHALATION_SOLUTION | RESPIRATORY_TRACT | Status: DC | PRN

## 2022-02-10 MED ORDER — CHLORHEXIDINE GLUCONATE CLOTH 2 % EX PADS
6.0000 | MEDICATED_PAD | Freq: Every day | CUTANEOUS | Status: DC
Start: 2022-02-10 — End: 2022-02-11
  Administered 2022-02-10: 6 via TOPICAL

## 2022-02-10 MED ORDER — LORATADINE 10 MG PO TABS
10.0000 mg | ORAL_TABLET | Freq: Every day | ORAL | Status: DC
  Administered 2022-02-10 – 2022-02-12 (×3): 10 mg via ORAL
  Filled 2022-02-10 (×3): qty 1

## 2022-02-10 NOTE — Progress Notes (Signed)
Patient seen and examined; Admitted after midnight secondary to sepsis due to community-acquired pneumonia.  Patient met criteria for severe sepsis with fever, tachycardia, tachypnea, leukocytosis and hypoxia at time of admission.  Patient blood pressure was also low and lactic acid mildly elevated, but responded adequately to fluid resuscitation and blood pressure is now stable and lactic acid back to normal range.  Patient reports feeling better already and demonstrate hemodynamically stability.  Please refer to H&P written by Dr.Zierle-Ghosh on 02/10/22 for further info/details on admission.  Plan: -Continue supportive care, adequate hydration and IV antibiotics -Follow culture results -Continue bronchodilators and mucolytic's -Continue as needed antipyretics and follow clinical response. -Replete electrolytes and follow trend.  Barton Dubois MD [phone]

## 2022-02-10 NOTE — Progress Notes (Signed)
  Transition of Care [facility]) Screening Note   Patient Details  Name: Nyaira Hodgens Date of Birth: August 25, 1940   [facility] of [facility]) CM/SW Contact:    Iona Beard, [place] Phone Number: 02/10/2022, 12:04 PM    Transition of Care Department [facility]) has reviewed patient and no TOC needs have been identified at this time. We will continue to monitor patient advancement through interdisciplinary progression rounds. If new patient transition needs arise, please place a TOC consult.

## 2022-02-10 NOTE — Assessment & Plan Note (Addendum)
-   Patient antihypertensive agents dosages has been adjusted at discharge -Instructed to maintain adequate hydration -Heart healthy diet discussed with patient.   -Stable blood pressure at discharge.

## 2022-02-10 NOTE — Assessment & Plan Note (Addendum)
-  Rate controlled and overall condition stable. -Continue the use of apixaban for secondary prevention. -Continue patient follow-up with cardiology service.

## 2022-02-10 NOTE — Assessment & Plan Note (Addendum)
-  Potassium 2.3 at time of admission -Improved/repleted currently -Continue to follow electrolytes trend/stability. -Given underlying history of atrial fibrillation goal is for potassium above 4 and magnesium above 2.

## 2022-02-10 NOTE — Assessment & Plan Note (Addendum)
-  Patient met criteria for severe sepsis at time of admission with: Fever, tachycardia, tachypnea, hypotension, and acute respiratory failure with hypoxia; also with acute kidney injury as part of organ dysfunctions. -Currently not requiring oxygen supplementation -Sepsis features improving/resolving: -Continue maintaining adequate hydration and continue current IV antibiotic -Will continue antitussive/mucolytic medications, as needed antipyretics, as needed bronchodilators and the use of flutter valve. -Hopefully home in 1-2 days. -Patient negative for COVID/flu

## 2022-02-10 NOTE — Assessment & Plan Note (Addendum)
Continue PPI ?

## 2022-02-10 NOTE — Assessment & Plan Note (Addendum)
-  Initially requiring 2 L nasal cannula -Improved with treatment of pneumonia -Continue as needed bronchodilators -currently not requiring any oxygen supplementation.

## 2022-02-10 NOTE — Assessment & Plan Note (Addendum)
-  will continue Synthroid.

## 2022-02-10 NOTE — Assessment & Plan Note (Addendum)
-  CAP with suspicious for bacterial infection -follow culture results -continue current IV antibiotics, mucolytic, bronchodilators and supportive care. -follow clinical response. -flutter valve ordered.

## 2022-02-10 NOTE — Assessment & Plan Note (Addendum)
-  advise to maintain adequate hydration

## 2022-02-10 NOTE — H&P (Signed)
History and Physical    Patient: [name] [name] DXI:[id number] DOB: [birth year] DOA: 02/09/2022 DOS: the patient was seen and examined on 02/10/2022 PCP: [name], FNP  Patient coming from: Home  Chief Complaint:  Chief Complaint  Patient presents with   Code Sepsis   HPI: [name] [name] is a 81 y.o. female with medical history significant of anemia, basal cell carcinoma, DVT, dyspnea, GERD, hypertension, hypothyroidism, PE, A-fib, and more presents the ED with a chief complaint of fever.  Patient reports that she has had cough and fever.  At home today she had rigors and chills and that was for her coming into the hospital today.  She reports several days of URI symptoms, sore throat, cough.  The cough then started producing yellow sputum.  She denies shortness of breath.  She has had no chest pain, palpitations.  Patient reports she has not tried anything at home for the symptoms.  She has associated weakness and malaise.  She has had a decreased appetite.  She denies any nausea, vomiting, diarrhea, hematuria.  Patient has no other complaints at this time.  Patient does not smoke, does not drink, does not use illicit drugs.  She is vaccinated for COVID.  Patient is DNR. Review of Systems: As mentioned in the history of present illness. All other systems reviewed and are negative. Past Medical History:  Diagnosis Date   Anemia    Arthritis    Ostearthritis- hips, knees, fingers   Basal cell carcinoma (BCC) of right hand 10/2021   Dx by [facility]   DVT (deep venous thrombosis) (HCC)    Dyspnea    GERD (gastroesophageal reflux disease)    Headache(784.0)    tx. Valproic acid   History of diabetes mellitus    History of kidney stones    Hypertension    Hypothyroidism    Pulmonary embolism [facility])    Past Surgical History:  Procedure Laterality Date   ABDOMINAL HYSTERECTOMY     BILATERAL HIP ARTHROSCOPY Left    BIOPSY  12/08/2019   Procedure: BIOPSY;  Surgeon:  Daneil Dolin, MD;  Location: [facility];  Service: Endoscopy;;   CATARACT EXTRACTION, BILATERAL     COLONOSCOPY N/A 12/08/2019   polyps (tubular adenoma), diverticulosis, colonic lipoma, no surveillance due to age   22 W/ [place] Right 12/25/2020   Procedure: CYSTOSCOPY WITH RETROGRADE PYELOGRAM/URETERAL STENT PLACEMENT;  Surgeon: Janith Lima, MD;  Location: [facility];  Service: Urology;  Laterality: Right;   CYSTOSCOPY/URETEROSCOPY/HOLMIUM LASER/STENT PLACEMENT Right 01/15/2021   Procedure: CYSTOSCOPY/RETROGRADE/URETEROSCOPY/HOLMIUM LASER/STENT EXCHANGE;  Surgeon: Janith Lima, MD;  Location: [facility];  Service: Urology;  Laterality: Right;   ESOPHAGOGASTRODUODENOSCOPY N/A 12/08/2019   normal esophagus with 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   ESOPHAGOGASTRODUODENOSCOPY (EGD) WITH PROPOFOL N/A 05/18/2021   Procedure: ESOPHAGOGASTRODUODENOSCOPY (EGD) WITH PROPOFOL;  Surgeon: Clarene Essex, MD;  Location: [facility];  Service: Endoscopy;  Laterality: N/A;  RFA; APC; ultraslim scope   GIVENS CAPSULE STUDY N/A 01/13/2020   Procedure: GIVENS CAPSULE STUDY;  Surgeon: Daneil Dolin, MD;  [facility];  Service: Endoscopy;  Laterality: N/A;  7:30am   GIVENS CAPSULE STUDY N/A 05/04/2021   Procedure: GIVENS CAPSULE STUDY;  Surgeon: Clarene Essex, MD;  Location: [facility];  Service: Endoscopy;  Laterality: N/A;   MULTIPLE TOOTH EXTRACTIONS     60's   PARATHYROIDECTOMY     POLYPECTOMY  12/08/2019   Procedure: POLYPECTOMY;  Surgeon: Gala Romney,  Cristopher Estimable, MD;  [facility]: [facility];  Service: Endoscopy;;   RADIOFREQUENCY ABLATION  05/18/2021   Procedure: RADIO FREQUENCY ABLATION;  Surgeon: Clarene Essex, MD;  [facility];  Service: Endoscopy;;   TEE WITHOUT CARDIOVERSION N/A 07/06/2021   Procedure: TRANSESOPHAGEAL ECHOCARDIOGRAM (TEE);  Surgeon: Donato Heinz, MD;  Location: [facility] ENDOSCOPY;  Service:  Cardiovascular;  Laterality: N/A;   TOTAL HIP ARTHROPLASTY Right 10/29/2013   Procedure: RIGHT TOTAL HIP ARTHROPLASTY ANTERIOR APPROACH;  Surgeon: Mauri Pole, MD;  Location: [facility];  Service: Orthopedics;  Laterality: Right;   Social History:  reports that she has never smoked. She has never used smokeless tobacco. She reports that she does not drink alcohol and does not use drugs.  No Known Allergies  Family History  Problem Relation Age of Onset   Colitis Sister 45       alive   Cancer Sister 77       colon   Cancer Mother 39       uterine   Heart disease Father 55       heart failure   Heart attack Brother 39   Healthy Daughter    Healthy Son    Pulmonary embolism Brother 8   Heart disease Brother    Healthy Son    Healthy Son     Prior to Admission medications   Medication Sig Start Date End Date Taking? Authorizing Provider  acetaminophen (TYLENOL) 325 MG tablet Take 2 tablets (650 mg total) by mouth every 6 (six) hours as needed for mild pain or headache (or Fever >/= 101). 04/04/19  Yes Emokpae, Courage, MD  allopurinol (ZYLOPRIM) 100 MG tablet Take 1 tablet (100 mg total) by mouth daily. 02/07/22  Yes [name], FNP  amLODipine (NORVASC) 10 MG tablet Take 1 tablet (10 mg total) by mouth daily. 08/03/21 02/09/22 Yes Minus Breeding, MD  apixaban (ELIQUIS) 2.5 MG TABS tablet Take 1 tablet (2.5 mg total) by mouth 2 (two) times daily. 02/02/22  Yes [name] F, FNP  atorvastatin (LIPITOR) 20 MG tablet Take 1 tablet (20 mg total) by mouth daily. 10/28/21  Yes [name] F, FNP  dapagliflozin propanediol (FARXIGA) 10 MG TABS tablet Take 1 tablet (10 mg total) by mouth daily. 10/30/21  Yes [name] F, FNP  furosemide (LASIX) 20 MG tablet Take 1 tablet (20 mg total) by mouth daily. 07/20/21  Yes Minus Breeding, MD  GNP CRANBERRY EXTRACT PO Take 500 mg by mouth in the morning.   Yes [provider]  hydrochlorothiazide (HYDRODIURIL) 12.5 MG tablet Take  1 tablet (12.5 mg total) by mouth daily. 10/07/21  Yes Shamleffer, Melanie Crazier, MD  levothyroxine (SYNTHROID) 50 MCG tablet TAKE 1 TABLET BY MOUTH ONCE DAILY BEFORE BREAKFAST 08/23/21  Yes Loman Brooklyn, FNP  loratadine (CLARITIN) 10 MG tablet Take 10 mg by mouth daily.   Yes [provider]  oxybutynin (DITROPAN-XL) 10 MG 24 hr tablet Take 1 tablet (10 mg total) by mouth at bedtime. 02/07/22  Yes [name], FNP  pantoprazole (PROTONIX) 40 MG tablet Take 1 tablet (40 mg total) by mouth 2 (two) times daily before a meal. 02/07/22  Yes [name], FNP  vitamin B-12 (CYANOCOBALAMIN) 50 MCG tablet Take 50 mcg by mouth daily.   Yes [provider]  Vitamin D3 (VITAMIN D) 25 MCG tablet Take 1,000 Units by mouth daily.   Yes [provider]  ONE TOUCH ULTRA TEST test strip USE UP  TO FOUR TIMES DAILY AS DIRECTED Patient taking differently: 1 each by Other route 4 (four) times daily. 10/04/17   Terald Sleeper, PA-C    Physical Exam: Vitals:   02/10/22 0030 02/10/22 0100 02/10/22 0130 02/10/22 0200  BP: (!) 101/47 (!) 98/41 (!) 92/47 (!) 97/50  Pulse: 75 73 69 (!) 59  Resp: (!) 23 (!) 24 (!) 24 (!) 24  Temp:    98.2 F (36.8 C)  TempSrc:    Oral  SpO2: 96% 94% 93% 96%  Weight:      Height:       1.  General: Patient lying supine in bed,  no acute distress   2. Psychiatric: Alert and oriented x 3, mood and behavior normal for situation, pleasant and cooperative with exam   3. Neurologic: Speech and language are normal, face is symmetric, moves all 4 extremities voluntarily, at baseline without acute deficits on limited exam   4. HEENMT:  Head is atraumatic, normocephalic, pupils reactive to light, neck is supple, trachea is midline, mucous membranes are moist   5. Respiratory : Lungs are clear to auscultation bilaterally without wheezing, rhonchi, rales, no cyanosis, no increase in work of breathing or accessory muscle use   6. Cardiovascular  : Heart rate normal, rhythm is irregular, no murmurs, rubs or gallops, no peripheral edema, peripheral pulses palpated   7. Gastrointestinal:  Abdomen is soft, nondistended, nontender to palpation bowel sounds active, no masses or organomegaly palpated   8. Skin:  Skin is warm, dry and intact without rashes, acute lesions, or ulcers on limited exam   9.Musculoskeletal:  No acute deformities or trauma, no asymmetry in tone, no peripheral edema, peripheral pulses palpated, no tenderness to palpation in the extremities  Data Reviewed: In the ED Temp 102.7, heart rate 90-107, respiratory rate 20-29, blood pressure as low as 78/34-112/58, satting 94% Mild leukocytosis at 10.8, hemoglobin 11.1, platelets 189 Chemistry shows a hypokalemia 2.3 Creatinine is 1.6 but looks like its been slowly uptrending, and this is not likely AKI Albumin 3.4 Magnesium 1.6 Lactic acid 2.1, then 1.1 Chest x-ray shows no acute intrathoracic process Negative COVID and flu Patient was started on Rocephin and Zithromax, 1 L bolus given Admission requested for sepsis secondary to pneumonia  Assessment and Plan: * Sepsis ([place]) Fever, tachycardia, tachypnea, hypotension, and acute respiratory failure with hypoxia -Patient was initially requiring 2 L nasal cannula but has now been weaned down to room air -She reports productive cough, and fevers -Most likely sources pneumonia although there is no pneumonia seen on chest x-ray -Continue Zithromax Rocephin -Patient was given a 1 L fluid bolus and continued on LR 150 mL/h -Blood cultures pending -Sputum culture and urine antigens pending -Negative COVID and flu  CAP (community acquired pneumonia) - Febrile, tachycardic, tachypneic, productive cough -Negative COVID and flu -Rocephin and Zithromax started in the ED, continue Rocephin and Zithromax -Tylenol for fever -Sputum culture pending -Urine Legionella and strep antigens pending -Blood cultures  pending -Continue to monitor  Hypoalbuminemia - Encourage nutrient dense food intake  Acute respiratory failure with hypoxia ([place]) - Initially requiring 2 L nasal cannula -Improved with treatment of pneumonia -Continue as needed albuterol -Continue oxygen supplementation as needed with goal to wean off to room air -Secondary to CAP and sepsis -Continue to monitor  Hypokalemia - Potassium 2.3 - 40 mEq IV ordered in ED, another 40 mEq p.o. ordered at admission - Trend in the a.m. along with mag  Chronic a-fib ([place]) -  Continue Eliquis  Gastroesophageal reflux disease without esophagitis - Continue Protonix  Dyslipidemia - Continue atorvastatin  Acquired hypothyroidism - Continue Synthroid  Essential hypertension - Holding amlodipine, Lasix, hydrochlorothiazide in the setting of soft pressures in the ED as low as 78/34, and admission systolic mid 33I -Continue treatment for sepsis -Add back blood pressure medications as tolerated as patient improves      Advance Care Planning:   Code Status: DNR   Consults: None  Family Communication: None  Severity of Illness: The appropriate patient status for this patient is INPATIENT. Inpatient status is judged to be reasonable and necessary in order to provide the required intensity of service to ensure the patient's safety. The patient's presenting symptoms, physical exam findings, and initial radiographic and laboratory data in the context of their chronic comorbidities is felt to place them at high risk for further clinical deterioration. Furthermore, it is not anticipated that the patient will be medically stable for discharge from the hospital within 2 midnights of admission.   * I certify that at the point of admission it is my clinical judgment that the patient will require inpatient hospital care spanning beyond 2 midnights from the point of admission due to high intensity of service, high risk for further deterioration and  high frequency of surveillance required.*  Author: Rolla Plate, DO 02/10/2022 3:37 AM  For on call review [email or web address].

## 2022-02-10 NOTE — Care Management Important Message (Signed)
Important Message  Patient Details  Name: [name] [name] MRN: [id number] Date of Birth: 08-Jul-1941   Medicare Important Message Given:  N/A - LOS <3 / Initial given by admissions     Dannette Barbara 02/10/2022, 5:23 PM

## 2022-02-10 NOTE — Assessment & Plan Note (Addendum)
-  Continue Lipitor °

## 2022-02-11 DIAGNOSIS — J189 Pneumonia, unspecified organism: Secondary | ICD-10-CM | POA: Diagnosis not present

## 2022-02-11 DIAGNOSIS — A419 Sepsis, unspecified organism: Secondary | ICD-10-CM | POA: Diagnosis not present

## 2022-02-11 DIAGNOSIS — N179 Acute kidney failure, unspecified: Secondary | ICD-10-CM

## 2022-02-11 DIAGNOSIS — E039 Hypothyroidism, unspecified: Secondary | ICD-10-CM | POA: Diagnosis not present

## 2022-02-11 DIAGNOSIS — J9601 Acute respiratory failure with hypoxia: Secondary | ICD-10-CM | POA: Diagnosis not present

## 2022-02-11 LAB — URINE CULTURE: Culture: NO GROWTH

## 2022-02-11 LAB — BLOOD CULTURE ID PANEL (REFLEXED) - BCID2

## 2022-02-11 LAB — BASIC METABOLIC PANEL
Anion gap: 6 (ref 5–15)
BUN: 25 mg/dL — ABNORMAL HIGH (ref 8–23)
CO2: 25 mmol/L (ref 22–32)
Calcium: 9.3 mg/dL (ref 8.9–10.3)
Chloride: 106 mmol/L (ref 98–111)
Creatinine, Ser: 1.11 mg/dL — ABNORMAL HIGH (ref 0.44–1.00)
GFR, Estimated: 50 mL/min — ABNORMAL LOW (ref >60)
Glucose, Bld: 109 mg/dL — ABNORMAL HIGH (ref 70–99)
Potassium: 3.7 mmol/L (ref 3.5–5.1)
Sodium: 137 mmol/L (ref 135–145)

## 2022-02-11 LAB — CBC
HCT: 24 % — ABNORMAL LOW (ref 36.0–46.0)
Hemoglobin: 7.2 g/dL — ABNORMAL LOW (ref 12.0–15.0)
MCH: 29.9 pg (ref 26.0–34.0)
MCHC: 30 g/dL (ref 30.0–36.0)
MCV: 99.6 fL (ref 80.0–100.0)
Platelets: 178 10*3/uL (ref 150–400)
RBC: 2.41 MIL/uL — ABNORMAL LOW (ref 3.87–5.11)
RDW: 13.6 % (ref 11.5–15.5)
WBC: 10.8 10*3/uL — ABNORMAL HIGH (ref 4.0–10.5)
nRBC: 0 % (ref 0.0–0.2)

## 2022-02-11 LAB — LEGIONELLA PNEUMOPHILA SEROGP 1 UR AG: L. pneumophila Serogp 1 Ur Ag: NEGATIVE

## 2022-02-11 NOTE — Progress Notes (Signed)
Progress Note   Patient: [name] [name] MRN:[id number] DOB: 08/27/1940 DOA: 02/09/2022     2 DOS: the patient was seen and examined on 02/11/2022   Brief hospital admission course: As per H&P written by Dr. Zierle-Ghosh on 02/11/22 [name] [name] is a 81 y.o. female with medical history significant of anemia, basal cell carcinoma, DVT, dyspnea, GERD, hypertension, hypothyroidism, PE, A-fib, and more presents the ED with a chief complaint of fever.  Patient reports that she has had cough and fever.  At home today she had rigors and chills and that was for her coming into the hospital today.  She reports several days of URI symptoms, sore throat, cough.  The cough then started producing yellow sputum.  She denies shortness of breath.  She has had no chest pain, palpitations.  Patient reports she has not tried anything at home for the symptoms.  She has associated weakness and malaise.  She has had a decreased appetite.  She denies any nausea, vomiting, diarrhea, hematuria.  Patient has no other complaints at this time.   Patient does not smoke, does not drink, does not use illicit drugs.  She is vaccinated for COVID.  Patient is DNR.  Assessment and Plan: * Sepsis (HCC) -Patient met criteria for severe sepsis at time of admission with: Fever, tachycardia, tachypnea, hypotension, and acute respiratory failure with hypoxia; also with acute kidney injury as part of organ dysfunctions. -Currently not requiring oxygen supplementation -Sepsis features improving/resolving: -Continue maintaining adequate hydration and continue current IV antibiotic -Will continue antitussive/mucolytic medications, as needed antipyretics, as needed bronchodilators and the use of flutter valve. -Hopefully home in 1-2 days. -Patient negative for COVID/flu  CAP (community acquired pneumonia) -CAP with suspicious for bacterial infection -follow culture results -continue current IV antibiotics, mucolytic,  bronchodilators and supportive care. -follow clinical response. -flutter valve ordered.  Hypoalbuminemia -advise to maintain adequate hydration   Acute respiratory failure with hypoxia (HCC) -Initially requiring 2 L nasal cannula -Improved with treatment of pneumonia -Continue as needed bronchodilators -currently not requiring any oxygen supplementation.    Hypokalemia - Potassium 2.3 at time of admission -Improved/repleted currently -Continue to follow -And try to keep above 4 for potassium goal.  Chronic a-fib (HCC) -Rate control and overall heart rate stable -Continue the use of apixaban for secondary prevention.  Gastroesophageal reflux disease without esophagitis -Continue PPI  Dyslipidemia -Continue Lipitor  Acquired hypothyroidism -will continue Synthroid.  Essential hypertension - Continue soft pressures at time of admission in the presence of acute kidney injury.  -Continue maintaining adequate hydration and follow vital signs. -Blood pressure stabilizing appropriately and not requiring any pressor support. -Currently patient.   Class II obesity -Body mass index is 37.8 kg/m. -Low calorie diet and portion control discussed with patient.   Subjective:  Afebrile currently; still slightly short winded with activity and reported intermittent coughing spells.  Overnight placed on oxygen supplementation 1-2 L mainly for comfort.  Patient reports feeling better.  Physical Exam: Vitals:   02/11/22 0600 02/11/22 0811 02/11/22 0952 02/11/22 1300  BP: (!) 133/56   (!) 114/50  Pulse: 73   78  Resp: (!) 26 19  18  Temp:    99 F (37.2 C)  TempSrc:    Oral  SpO2: 96%  98% 93%  Weight:      Height:       General exam: Alert, awake, oriented x 3, reporting intermittent episode of productive coughing spells and short winded sensation with activity.  Overnight 1-2   L saturation placed mainly for comfort.  Good saturation appreciated on exam. Respiratory system:  Positive rhonchi bilaterally, no using accessory muscles, no wheezing currently. Cardiovascular system:RRR. No rubs or gallops. Gastrointestinal system: Abdomen is obese, nondistended, soft and nontender. No organomegaly or masses felt. Normal bowel sounds heard. Central nervous system: Alert and oriented. No focal neurological deficits. Extremities: No cyanosis or clubbing. Skin: No petechiae. Psychiatry: Judgement and insight appear normal. Mood & affect appropriate.    Data Reviewed: CBC: WBCs 10.8, hemoglobin 7.2, platelets count 178 K Isolated staph epidermidis 1/4 blood culture (contaminant) BMET: Sodium 137, potassium 3.7, chloride 106, bicarb 25, BUN 25, creatinine 1.11  Family Communication: Son at bedside.  Disposition: Status is: Inpatient Remains inpatient appropriate because: Receiving IV antibiotic for sepsis secondary to community-acquired pneumonia   Planned Discharge Destination: Home   Author: Carlos Madera, MD 02/11/2022 5:01 PM  For on call review [email or web address].  

## 2022-02-11 NOTE — Progress Notes (Signed)
PHARMACY - PHYSICIAN COMMUNICATION CRITICAL VALUE ALERT - BLOOD CULTURE IDENTIFICATION (BCID)  [name] [name] is an 81 y.o. female who presented to [facility] on 02/09/2022 with a chief complaint of cap  Assessment:  positive BCID but just 1 out of 4 bottles, staph epi mec a +, likely contaminant (include suspected source if known)  Name of physician (or Provider) Contacted: Dr Dyann Kief  Current antibiotics: azithro + CTX  Changes to prescribed antibiotics recommended:  Patient is on recommended antibiotics - No changes needed  Results for orders placed or performed during the hospital encounter of 02/09/22  Blood Culture ID Panel (Reflexed) (Collected: 02/09/2022  8:13 PM)  Result Value Ref Range   Enterococcus faecalis NOT DETECTED NOT DETECTED   Enterococcus Faecium NOT DETECTED NOT DETECTED   Listeria monocytogenes NOT DETECTED NOT DETECTED   Staphylococcus species DETECTED (A) NOT DETECTED   Staphylococcus aureus (BCID) NOT DETECTED NOT DETECTED   Staphylococcus epidermidis DETECTED (A) NOT DETECTED   Staphylococcus lugdunensis NOT DETECTED NOT DETECTED   Streptococcus species NOT DETECTED NOT DETECTED   Streptococcus agalactiae NOT DETECTED NOT DETECTED   Streptococcus pneumoniae NOT DETECTED NOT DETECTED   Streptococcus pyogenes NOT DETECTED NOT DETECTED   A.calcoaceticus-baumannii NOT DETECTED NOT DETECTED   Bacteroides fragilis NOT DETECTED NOT DETECTED   Enterobacterales NOT DETECTED NOT DETECTED   Enterobacter cloacae complex NOT DETECTED NOT DETECTED   Escherichia coli NOT DETECTED NOT DETECTED   Klebsiella aerogenes NOT DETECTED NOT DETECTED   Klebsiella oxytoca NOT DETECTED NOT DETECTED   Klebsiella pneumoniae NOT DETECTED NOT DETECTED   Proteus species NOT DETECTED NOT DETECTED   Salmonella species NOT DETECTED NOT DETECTED   Serratia marcescens NOT DETECTED NOT DETECTED   Haemophilus influenzae NOT DETECTED NOT DETECTED   Neisseria meningitidis NOT DETECTED  NOT DETECTED   Pseudomonas aeruginosa NOT DETECTED NOT DETECTED   Stenotrophomonas maltophilia NOT DETECTED NOT DETECTED   Candida albicans NOT DETECTED NOT DETECTED   Candida auris NOT DETECTED NOT DETECTED   Candida glabrata NOT DETECTED NOT DETECTED   Candida krusei NOT DETECTED NOT DETECTED   Candida parapsilosis NOT DETECTED NOT DETECTED   Candida tropicalis NOT DETECTED NOT DETECTED   Cryptococcus neoformans/gattii NOT DETECTED NOT DETECTED   Methicillin resistance mecA/C DETECTED (A) NOT DETECTED    Brynnlie Unterreiner 02/11/2022  10:37 AM

## 2022-02-12 DIAGNOSIS — J9601 Acute respiratory failure with hypoxia: Secondary | ICD-10-CM | POA: Diagnosis not present

## 2022-02-12 DIAGNOSIS — E039 Hypothyroidism, unspecified: Secondary | ICD-10-CM | POA: Diagnosis not present

## 2022-02-12 DIAGNOSIS — J189 Pneumonia, unspecified organism: Secondary | ICD-10-CM | POA: Diagnosis not present

## 2022-02-12 DIAGNOSIS — A419 Sepsis, unspecified organism: Secondary | ICD-10-CM | POA: Diagnosis not present

## 2022-02-12 MED ORDER — AMOXICILLIN-POT CLAVULANATE 500-125 MG PO TABS: 1.0000 | ORAL_TABLET | Freq: Three times a day (TID) | ORAL | 0 refills | Status: AC

## 2022-02-12 MED ORDER — ALBUTEROL SULFATE HFA 108 (90 BASE) MCG/ACT IN AERS: 2.0000 | INHALATION_SPRAY | Freq: Four times a day (QID) | RESPIRATORY_TRACT | 2 refills | Status: DC | PRN

## 2022-02-12 MED ORDER — DOCUSATE SODIUM 100 MG PO CAPS: 100.0000 mg | ORAL_CAPSULE | Freq: Two times a day (BID) | ORAL | 1 refills | Status: DC

## 2022-02-12 MED ORDER — POLYETHYLENE GLYCOL 3350 17 G PO PACK: 17.0000 g | PACK | Freq: Every day | ORAL | 1 refills | Status: DC | PRN

## 2022-02-12 MED ORDER — DM-GUAIFENESIN ER 30-600 MG PO TB12: 1.0000 | ORAL_TABLET | Freq: Two times a day (BID) | ORAL | 0 refills | Status: AC

## 2022-02-12 MED ORDER — AMLODIPINE BESYLATE 2.5 MG PO TABS: 2.5000 mg | ORAL_TABLET | Freq: Every day | ORAL | 2 refills | Status: DC

## 2022-02-12 NOTE — Discharge Summary (Signed)
Physician Discharge Summary   Patient: [name] [name] MRN: [id number] DOB: [birth year]  Admit date:     02/09/2022  Discharge date: 02/12/22  Discharge Physician: [name] [name]   PCP: [name] Perking, FNP   Recommendations at discharge:  Repeat basic metabolic panel to follow electrolytes and renal function Repeat chest x-ray in 6-8 weeks to assure complete resolution of infiltrates. Continue to follow blood pressure and further adjust antihypertensive treatment as required Make sure patient has follow-up with cardiology service as previously instructed.  Discharge Diagnoses: Principal Problem:   Severe sepsis ([place]) Active Problems:   Essential hypertension   Acquired hypothyroidism   Dyslipidemia   Gastroesophageal reflux disease without esophagitis   Chronic a-fib (HCC)   Hypokalemia   Acute respiratory failure with hypoxia (HCC)   Hypoalbuminemia   CAP (community acquired pneumonia)  Brief Hospital admission course: As per H&P written by [name] [name] on 02/11/22 [name] [name] is a 81 y.o. female with medical history significant of anemia, basal cell carcinoma, DVT, dyspnea, GERD, hypertension, hypothyroidism, PE, A-fib, and more presents the ED with a chief complaint of fever.  Patient reports that she has had cough and fever.  At home today she had rigors and chills and that was for her coming into the hospital today.  She reports several days of URI symptoms, sore throat, cough.  The cough then started producing yellow sputum.  She denies shortness of breath.  She has had no chest pain, palpitations.  Patient reports she has not tried anything at home for the symptoms.  She has associated weakness and malaise.  She has had a decreased appetite.  She denies any nausea, vomiting, diarrhea, hematuria.  Patient has no other complaints at this time.   Patient does not smoke, does not drink, does not use illicit drugs.  She is vaccinated for COVID.  Patient is  DNR.  Assessment and Plan: * Severe sepsis ([place]) -Patient met criteria for severe sepsis at time of admission with: Fever, tachycardia, tachypnea, hypotension, and acute respiratory failure with hypoxia; also with acute kidney injury as part of organ dysfunctions. -At time of discharge renal function essentially back to baseline and patient not requiring oxygen supplementation. -Sepsis features resolved -Patient discharged home with oral antibiotic therapy to complete management for pneumonia. -Continue the use of mucolytic's and as needed bronchodilators. -COVID/influenza PCR test is negative.  CAP (community acquired pneumonia) -CAP with suspicious for bacterial infection -No growth appreciated in her cultures. -continue as needed bronchodilator, mucolytic's and at discharge 6 more days of oral antibiotics to complete management. -Repeat chest x-ray in 6-8 weeks to assure complete resolution of infiltrates.  Hypoalbuminemia -advise to maintain adequate hydration   Acute respiratory failure with hypoxia (HCC) -Initially requiring 2 L nasal cannula -Improved with treatment for pneumonia -Continue as needed bronchodilators -currently not requiring any oxygen supplementation. -Good saturation on room air at time of discharge.  Hypokalemia -Potassium 2.3 at time of admission -Improved/repleted currently -Continue to follow electrolytes trend/stability. -Given underlying history of atrial fibrillation goal is for potassium above 4 and magnesium above 2.  Chronic a-fib (HCC) -Rate controlled and overall condition stable. -Continue the use of apixaban for secondary prevention. -Continue patient follow-up with cardiology service.  Gastroesophageal reflux disease without esophagitis -Continue PPI  Dyslipidemia -Continue Lipitor  Acquired hypothyroidism -will continue Synthroid.  Essential hypertension - Patient antihypertensive agents dosages has been adjusted at  discharge -Instructed to maintain adequate hydration -Heart healthy diet discussed with patient.   -Stable blood  pressure at discharge.  Class II obesity -Low calorie diet and portion control discussed with patient. -Body mass index is 37.8 kg/m.   Consultants: None Procedures performed: See below for x-ray reports. Disposition: Home Diet recommendation: Heart healthy/low-sodium and low calorie diet.  DISCHARGE MEDICATION: Allergies as of 02/12/2022   No Known Allergies      Medication List     STOP taking these medications    hydrochlorothiazide 12.5 MG tablet Commonly known as: HYDRODIURIL       TAKE these medications    acetaminophen 325 MG tablet Commonly known as: TYLENOL Take 2 tablets (650 mg total) by mouth every 6 (six) hours as needed for mild pain or headache (or Fever >/= 101).   albuterol 108 (90 Base) MCG/ACT inhaler Commonly known as: VENTOLIN HFA Inhale 2 puffs into the lungs every 6 (six) hours as needed for wheezing or shortness of breath.   allopurinol 100 MG tablet Commonly known as: ZYLOPRIM Take 1 tablet (100 mg total) by mouth daily.   amLODipine 2.5 MG tablet Commonly known as: NORVASC Take 1 tablet (2.5 mg total) by mouth daily. What changed:  medication strength how much to take   amoxicillin-clavulanate 500-125 MG tablet Commonly known as: Augmentin Take 1 tablet (500 mg total) by mouth 3 (three) times daily for 6 days.   apixaban 2.5 MG Tabs tablet Commonly known as: ELIQUIS Take 1 tablet (2.5 mg total) by mouth 2 (two) times daily.   atorvastatin 20 MG tablet Commonly known as: LIPITOR Take 1 tablet (20 mg total) by mouth daily.   dapagliflozin propanediol 10 MG Tabs tablet Commonly known as: Farxiga Take 1 tablet (10 mg total) by mouth daily.   dextromethorphan-guaiFENesin 30-600 MG 12hr tablet Commonly known as: MUCINEX DM Take 1 tablet by mouth 2 (two) times daily for 20 days.   docusate sodium 100 MG  capsule Commonly known as: COLACE Take 1 capsule (100 mg total) by mouth 2 (two) times daily.   furosemide 20 MG tablet Commonly known as: Lasix Take 1 tablet (20 mg total) by mouth daily.   GNP CRANBERRY EXTRACT PO Take 500 mg by mouth in the morning.   levothyroxine 50 MCG tablet Commonly known as: SYNTHROID TAKE 1 TABLET BY MOUTH ONCE DAILY BEFORE BREAKFAST   loratadine 10 MG tablet Commonly known as: CLARITIN Take 10 mg by mouth daily.   ONE TOUCH ULTRA TEST test strip Generic drug: glucose blood USE UP TO FOUR TIMES DAILY AS DIRECTED What changed: See the new instructions.   oxybutynin 10 MG 24 hr tablet Commonly known as: DITROPAN-XL Take 1 tablet (10 mg total) by mouth at bedtime.   pantoprazole 40 MG tablet Commonly known as: PROTONIX Take 1 tablet (40 mg total) by mouth 2 (two) times daily before a meal.   polyethylene glycol 17 g packet Commonly known as: MIRALAX / GLYCOLAX Take 17 g by mouth daily as needed for mild constipation.   vitamin B-12 50 MCG tablet Commonly known as: CYANOCOBALAMIN Take 50 mcg by mouth daily.   Vitamin D3 25 MCG tablet Commonly known as: Vitamin D Take 1,000 Units by mouth daily.        Follow-up Information     [name] Perking, FNP. Schedule an appointment as soon as possible for a visit in 10 day(s).   Specialty: Family Medicine Contact information: [place] [address] [phone]         Minus Breeding, MD .   Specialty: Cardiology Contact  information: [address] [place] [address] [phone]                Discharge Exam: Danley Danker Weights   02/09/22 1844 02/11/22 0400  Weight: 92 kg 96.8 kg   General exam: Alert, awake, oriented x 3; feeling ready to go home; no fever, no nausea, no vomiting, no chest pain. Respiratory system: Improved air movement bilaterally; no wheezing, no crackles, no using accessory muscle.  Good saturation on room air.  Scattered  rhonchi are appreciated. Cardiovascular system: Rate controlled, no rubs, no gallops, no JVD on exam. Gastrointestinal system: Abdomen is obese, nondistended, soft and nontender. No organomegaly or masses felt. Normal bowel sounds heard. Central nervous system: Alert and oriented. No focal neurological deficits. Extremities: No cyanosis or clubbing. Skin: No petechiae. Psychiatry: Judgement and insight appear normal. Mood & affect appropriate.    Condition at discharge: Stable and improved.  The results of significant diagnostics from this hospitalization (including imaging, microbiology, ancillary and laboratory) are listed below for reference.   Imaging Studies: DG Chest Port 1 View  Result Date: 02/09/2022 CLINICAL DATA:  Sepsis EXAM: PORTABLE CHEST 1 VIEW COMPARISON:  02/10/2021 FINDINGS: Single frontal view of the chest demonstrates an unremarkable cardiac silhouette. No airspace disease, effusion, or pneumothorax. No acute bony abnormalities. IMPRESSION: 1. No acute intrathoracic process. Electronically Signed   By: Randa Ngo M.D.   On: 02/09/2022 19:22    Microbiology: Results for orders placed or performed during the hospital encounter of 02/09/22  Culture, blood (Routine x 2)     Status: Abnormal (Preliminary result)   Collection Time: 02/09/22  8:13 PM   Specimen: Right Antecubital; Blood  Result Value Ref Range Status   Specimen Description   Final    RIGHT ANTECUBITAL Performed at [facility], [address], [place], [place] [address]    Special Requests   Final    BOTTLES DRAWN AEROBIC AND ANAEROBIC Blood Culture results may not be optimal due to an excessive volume of blood received in culture bottles Performed at [facility], [address] [place], [place] [address]    Culture  Setup Time   Final    GRAM POSITIVE COCCI ANAEROBIC BOTTLE Gram Stain Report Called to,Read Back By and Verified With: TINAJERO,E@0251  BY MATTHEWS, B 7.21.2023 CRITICAL RESULT CALLED  TO, READ BACK BY AND VERIFIED WITH: PHARMD G COFFEE [id number] AT 1033 AM BY CM    Culture (A)  Final    STAPHYLOCOCCUS EPIDERMIDIS THE SIGNIFICANCE OF ISOLATING THIS ORGANISM FROM A SINGLE SET OF BLOOD CULTURES WHEN MULTIPLE SETS ARE DRAWN IS UNCERTAIN. PLEASE NOTIFY THE MICROBIOLOGY DEPARTMENT WITHIN ONE WEEK IF SPECIATION AND SENSITIVITIES ARE REQUIRED. Performed at [facility], [place] [address], [place], [place] [address]    Report Status PENDING  Incomplete  Culture, blood (Routine x 2)     Status: None (Preliminary result)   Collection Time: 02/09/22  8:13 PM   Specimen: BLOOD LEFT ARM  Result Value Ref Range Status   Specimen Description BLOOD LEFT ARM  Final   Special Requests   Final    BOTTLES DRAWN AEROBIC AND ANAEROBIC Blood Culture adequate volume   Culture   Final    NO GROWTH 3 DAYS Performed at [facility], [address], [place],  [address]    Report Status PENDING  Incomplete  Blood Culture ID Panel (Reflexed)     Status: Abnormal   Collection Time: 02/09/22  8:13 PM  Result Value Ref  Range Status   Enterococcus faecalis NOT DETECTED NOT DETECTED Final   Enterococcus Faecium NOT DETECTED NOT DETECTED Final   Listeria monocytogenes NOT DETECTED NOT DETECTED Final   Staphylococcus species DETECTED (A) NOT DETECTED Final    Comment: CRITICAL RESULT CALLED TO, READ BACK BY AND VERIFIED WITH: PHARMD G COFFEE [id number] AT 1033 AM BY CM    Staphylococcus aureus (BCID) NOT DETECTED NOT DETECTED Final   Staphylococcus epidermidis DETECTED (A) NOT DETECTED Final    Comment: Methicillin (oxacillin) resistant coagulase negative staphylococcus. Possible blood culture contaminant (unless isolated from more than one blood culture draw or clinical case suggests pathogenicity). No antibiotic treatment is indicated for blood  culture contaminants. CRITICAL RESULT CALLED TO, READ BACK BY AND VERIFIED WITH: PHARMD G COFFEE [id number] AT 1033 AM BY CM    Staphylococcus lugdunensis  NOT DETECTED NOT DETECTED Final   Streptococcus species NOT DETECTED NOT DETECTED Final   Streptococcus agalactiae NOT DETECTED NOT DETECTED Final   Streptococcus pneumoniae NOT DETECTED NOT DETECTED Final   Streptococcus pyogenes NOT DETECTED NOT DETECTED Final   A.calcoaceticus-baumannii NOT DETECTED NOT DETECTED Final   Bacteroides fragilis NOT DETECTED NOT DETECTED Final   Enterobacterales NOT DETECTED NOT DETECTED Final   Enterobacter cloacae complex NOT DETECTED NOT DETECTED Final   Escherichia coli NOT DETECTED NOT DETECTED Final   Klebsiella aerogenes NOT DETECTED NOT DETECTED Final   Klebsiella oxytoca NOT DETECTED NOT DETECTED Final   Klebsiella pneumoniae NOT DETECTED NOT DETECTED Final   Proteus species NOT DETECTED NOT DETECTED Final   Salmonella species NOT DETECTED NOT DETECTED Final   Serratia marcescens NOT DETECTED NOT DETECTED Final   Haemophilus influenzae NOT DETECTED NOT DETECTED Final   Neisseria meningitidis NOT DETECTED NOT DETECTED Final   Pseudomonas aeruginosa NOT DETECTED NOT DETECTED Final   Stenotrophomonas maltophilia NOT DETECTED NOT DETECTED Final   Candida albicans NOT DETECTED NOT DETECTED Final   Candida auris NOT DETECTED NOT DETECTED Final   Candida glabrata NOT DETECTED NOT DETECTED Final   Candida krusei NOT DETECTED NOT DETECTED Final   Candida parapsilosis NOT DETECTED NOT DETECTED Final   Candida tropicalis NOT DETECTED NOT DETECTED Final   Cryptococcus neoformans/gattii NOT DETECTED NOT DETECTED Final   Methicillin resistance mecA/C DETECTED (A) NOT DETECTED Final    Comment: CRITICAL RESULT CALLED TO, READ BACK BY AND VERIFIED WITH: PHARMD G COFFEE [id number] AT 1033 BY CM Performed at [facility], [address] [place], [place] [address]   Resp Panel by RT-PCR (Flu A&B, Covid) Anterior Nasal Swab     Status: None   Collection Time: 02/09/22  8:34 PM   Specimen: Anterior Nasal Swab  Result Value Ref Range Status   SARS Coronavirus  2 by RT PCR NEGATIVE NEGATIVE Final    Comment: (NOTE) SARS-CoV-2 target nucleic acids are NOT DETECTED.  The SARS-CoV-2 RNA is generally detectable in upper respiratory specimens during the acute phase of infection. The lowest concentration of SARS-CoV-2 viral copies this assay can detect is 138 copies/mL. A negative result does not preclude SARS-Cov-2 infection and should not be used as the sole basis for treatment or other patient management decisions. A negative result may occur with  improper specimen collection/handling, submission of specimen other than nasopharyngeal swab, presence of viral mutation(s) within the areas targeted by this assay, and inadequate number of viral copies(<138 copies/mL). A negative result must be combined with clinical observations, patient history, and epidemiological information. The expected result is Negative.  Fact  Sheet for Patients:  EntrepreneurPulse.com.au  Fact Sheet for Healthcare Providers:  IncredibleEmployment.be  This test is no t yet approved or cleared by the [place] FDA and  has been authorized for detection and/or diagnosis of SARS-CoV-2 by FDA under an Emergency Use Authorization (EUA). This EUA will remain  in effect (meaning this test can be used) for the duration of the COVID-19 declaration under Section 564(b)(1) of the Act, 21 U.S.C.section 360bbb-3(b)(1), unless the authorization is terminated  or revoked sooner.       Influenza A by PCR NEGATIVE NEGATIVE Final   Influenza B by PCR NEGATIVE NEGATIVE Final    Comment: (NOTE) The Xpert Xpress SARS-CoV-2/FLU/RSV plus assay is intended as an aid in the diagnosis of influenza from Nasopharyngeal swab specimens and should not be used as a sole basis for treatment. Nasal washings and aspirates are unacceptable for Xpert Xpress SARS-CoV-2/FLU/RSV testing.  Fact Sheet for Patients: EntrepreneurPulse.com.au  Fact  Sheet for Healthcare Providers: IncredibleEmployment.be  This test is not yet approved or cleared by the [place] FDA and has been authorized for detection and/or diagnosis of SARS-CoV-2 by FDA under an Emergency Use Authorization (EUA). This EUA will remain in effect (meaning this test can be used) for the duration of the COVID-19 declaration under Section 564(b)(1) of the Act, 21 U.S.C. section 360bbb-3(b)(1), unless the authorization is terminated or revoked.  Performed at [facility], [address] [place], [place] [address]   Urine Culture     Status: None   Collection Time: 02/10/22  2:20 AM   Specimen: Urine, Clean Catch  Result Value Ref Range Status   Specimen Description   Final    URINE, CLEAN CATCH Performed at [facility], [address] [place], [place] [address]    Special Requests   Final    NONE Performed at [facility], [address] [place], [place] [address]    Culture   Final    NO GROWTH Performed at [facility], [place] [address] [place], [place] [address]    Report Status 02/11/2022 FINAL  Final  MRSA Next Gen by PCR, Nasal     Status: None   Collection Time: 02/10/22  9:50 AM   Specimen: Nasal Mucosa; Nasal Swab  Result Value Ref Range Status   MRSA by PCR Next Gen NOT DETECTED NOT DETECTED Final    Comment: (NOTE) The GeneXpert MRSA Assay (FDA approved for NASAL specimens only), is one component of a comprehensive MRSA colonization surveillance program. It is not intended to diagnose MRSA infection nor to guide or monitor treatment for MRSA infections. Test performance is not FDA approved in patients less than 39 years old. Performed at [facility], [address], [place], [place] [address]     Labs: CBC: Recent Labs  Lab 02/09/22 2013 02/10/22 0442 02/11/22 0338  WBC 10.8* 16.0* 10.8*  NEUTROABS 9.2* 12.5*  --   HGB 8.1* 7.2* 7.2*  HCT 26.1* 24.7* 24.0*  MCV 98.9 102.1* 99.6  PLT 189 174 678    Basic Metabolic Panel: Recent Labs  Lab 02/09/22 2013 02/09/22 2301 02/10/22 0442 02/11/22 0338  NA 136  --  137 137  K 2.3*  --  3.6 3.7  CL 102  --  105 106  CO2 24  --  24 25  GLUCOSE 136*  --  113* 109*  BUN 28*  --  28* 25*  CREATININE 1.60*  --  1.45* 1.11*  CALCIUM 9.7  --  9.2  9.3  MG  --  1.6* 2.3  --    Liver Function Tests: Recent Labs  Lab 02/09/22 2013 02/10/22 0442  AST 15 13*  ALT 13 11  ALKPHOS 80 69  BILITOT 0.6 0.4  PROT 5.9* 5.3*  ALBUMIN 3.4* 2.9*   CBG: No results for input(s): "GLUCAP" in the last 168 hours.  Discharge time spent: greater than 30 minutes.  Signed: Barton Dubois, MD [facility] 02/12/2022

## 2022-02-13 LAB — CULTURE, BLOOD (ROUTINE X 2)

## 2022-02-14 ENCOUNTER — Telehealth: Payer: Self-pay

## 2022-02-14 ENCOUNTER — Telehealth: Payer: Self-pay | Admitting: Family Medicine

## 2022-02-14 LAB — CULTURE, BLOOD (ROUTINE X 2)
Culture: NO GROWTH
Special Requests: ADEQUATE

## 2022-02-14 NOTE — Telephone Encounter (Signed)
Transition Care Management Follow-up Telephone Call Date of discharge and from where: 02/12/22 - [facility] - Severe Sepsis How have you been since you were released from the hospital? Just very weak and tired - feels like she is a little better Any questions or concerns? Yes - HGB was 7.2 @ discharge and she is really worried about this  Items Reviewed: Did the pt receive and understand the discharge instructions provided? Yes  Medications obtained and verified? Yes  Other? No  Any new allergies since your discharge? No  Dietary orders reviewed? Yes Do you have support at home? Yes   Home Care and Equipment/Supplies: Were home health services ordered? no  Were any new equipment or medical supplies ordered?  No  Functional Questionnaire: (I = Independent and D = Dependent) ADLs: I  Bathing/Dressing- I  Meal Prep- D  Eating- I  Maintaining continence- I  Transferring/Ambulation- I  Managing Meds- I  Follow up appointments reviewed:  PCP Hospital f/u appt confirmed? Yes  Scheduled to see Marjorie Smolder on 02/17/22 @ 11:15. [facility] f/u appt confirmed? Yes  Scheduled to see H[facility] for Echo on 03/29/22 @ 10. Are transportation arrangements needed? No  If their condition worsens, is the pt aware to call PCP or go to the Emergency Dept.? Yes Was the patient provided with contact information for the PCP's office or ED? Yes Was to pt encouraged to call back with questions or concerns? Yes

## 2022-02-14 NOTE — Telephone Encounter (Signed)
Patient does not want to come in before Thursday.  She said she has restarted taking iron and will have hemoglobin checked at appointment.

## 2022-02-14 NOTE — Telephone Encounter (Signed)
Patient needs hospital follow up. Seen at [facility] 7/19-7/22. Please call back.

## 2022-02-14 NOTE — Telephone Encounter (Signed)
Spoke with patient, appointment scheduled with Marjorie Smolder on 02/17/22

## 2022-02-16 ENCOUNTER — Telehealth: Payer: Self-pay

## 2022-02-16 NOTE — Chronic Care Management (AMB) (Signed)
  Chronic Care Management   Note  02/16/2022 Name: [name] [name] MRN: [id number] DOB: 1941/02/17  [name] [name] is a 81 y.o. year old female who is a primary care patient of [name], FNP. I reached out to [name] by phone today in response to a referral sent by [name] PCP.  [name] was given information about Chronic Care Management services today including:  CCM service includes personalized support from designated clinical staff supervised by her physician, including individualized plan of care and coordination with other care providers 24/7 contact phone numbers for assistance for urgent and routine care needs. Service will only be billed when office clinical staff spend 20 minutes or more in a month to coordinate care. Only one practitioner may furnish and bill the service in a calendar month. The patient may stop CCM services at any time (effective at the end of the month) by phone call to the office staff. The patient is responsible for co-pay (up to 20% after annual deductible is met) if co-pay is required by the individual health plan.   Patient agreed to services and verbal consent obtained.   Follow up plan: Telephone appointment with care management team member scheduled for:03/18/2022  Noreene Larsson, [place], [place] [address] Direct Dial: [phone] [email or web address]

## 2022-02-17 ENCOUNTER — Ambulatory Visit (INDEPENDENT_AMBULATORY_CARE_PROVIDER_SITE_OTHER): Payer: PPO | Admitting: Family Medicine

## 2022-02-17 ENCOUNTER — Encounter: Payer: Self-pay | Admitting: Family Medicine

## 2022-02-17 VITALS — BP 102/57 | HR 68 | Temp 98.5°F | Ht 63.0 in

## 2022-02-17 DIAGNOSIS — R531 Weakness: Secondary | ICD-10-CM

## 2022-02-17 DIAGNOSIS — R6520 Severe sepsis without septic shock: Secondary | ICD-10-CM

## 2022-02-17 DIAGNOSIS — Z7689 Persons encountering health services in other specified circumstances: Secondary | ICD-10-CM

## 2022-02-17 DIAGNOSIS — J189 Pneumonia, unspecified organism: Secondary | ICD-10-CM

## 2022-02-17 DIAGNOSIS — N179 Acute kidney failure, unspecified: Secondary | ICD-10-CM

## 2022-02-17 DIAGNOSIS — J9601 Acute respiratory failure with hypoxia: Secondary | ICD-10-CM | POA: Diagnosis not present

## 2022-02-17 DIAGNOSIS — I10 Essential (primary) hypertension: Secondary | ICD-10-CM

## 2022-02-17 DIAGNOSIS — A419 Sepsis, unspecified organism: Secondary | ICD-10-CM

## 2022-02-17 DIAGNOSIS — K921 Melena: Secondary | ICD-10-CM

## 2022-02-17 DIAGNOSIS — D649 Anemia, unspecified: Secondary | ICD-10-CM

## 2022-02-17 DIAGNOSIS — E876 Hypokalemia: Secondary | ICD-10-CM

## 2022-02-17 LAB — CBC WITH DIFFERENTIAL/PLATELET
Basophils Absolute: 0 10*3/uL (ref 0.0–0.2)
Basos: 0 %
EOS (ABSOLUTE): 0.1 10*3/uL (ref 0.0–0.4)
Eos: 1 %
Hematocrit: 23.1 % — ABNORMAL LOW (ref 34.0–46.6)
Hemoglobin: 7.1 g/dL — CL (ref 11.1–15.9)
Immature Grans (Abs): 0.1 10*3/uL (ref 0.0–0.1)
Immature Granulocytes: 1 %
Lymphocytes Absolute: 1.1 10*3/uL (ref 0.7–3.1)
Lymphs: 14 %
MCH: 28.3 pg (ref 26.6–33.0)
MCHC: 30.7 g/dL — ABNORMAL LOW (ref 31.5–35.7)
MCV: 92 fL (ref 79–97)
Monocytes Absolute: 1 10*3/uL — ABNORMAL HIGH (ref 0.1–0.9)
Monocytes: 13 %
Neutrophils Absolute: 5.7 10*3/uL (ref 1.4–7.0)
Neutrophils: 71 %
Platelets: 398 10*3/uL (ref 150–450)
RBC: 2.51 x10E6/uL — CL (ref 3.77–5.28)
RDW: 13.7 % (ref 11.7–15.4)
WBC: 7.9 10*3/uL (ref 3.4–10.8)

## 2022-02-17 LAB — BMP8+EGFR
BUN/Creatinine Ratio: 16 (ref 12–28)
BUN: 18 mg/dL (ref 8–27)
CO2: 22 mmol/L (ref 20–29)
Calcium: 10.5 mg/dL — ABNORMAL HIGH (ref 8.7–10.3)
Chloride: 104 mmol/L (ref 96–106)
Creatinine, Ser: 1.15 mg/dL — ABNORMAL HIGH (ref 0.57–1.00)
Glucose: 109 mg/dL — ABNORMAL HIGH (ref 70–99)
Potassium: 4.5 mmol/L (ref 3.5–5.2)
Sodium: 139 mmol/L (ref 134–144)
eGFR: 48 mL/min/{1.73_m2} — ABNORMAL LOW (ref >59)

## 2022-02-17 NOTE — Progress Notes (Signed)
Established Patient Office Visit  Subjective   Patient ID: [name] [name], female    DOB: 1941/01/04  Age: 80 y.o. MRN: [id number]  Chief Complaint  Patient presents with   Transitions Of Care    HPI [name] [name] was admitted to [facility] on 02/09/22.   Today's visit was for Transitional Care Management.  The patient was discharged from [facility] on 02/12/22 with a primary diagnosis of severe sepsis.   Contact with the patient and/or caregiver, by a clinical staff member, was made on 02/14/22 and was documented as a telephone encounter within the EMR.  Through chart review and discussion with the patient I have determined that management of their condition is of moderate complexity.   She presented with fever, URI symptoms, weakness and malaise. She met criteria for severe sepsis and was admitted. She was treated for CAP. She did have an AKI and kidney function returned to baseline prior to discharge. She was on oxygen in the hospital but was discharged on room air. She will finish up amoxicllin tomorrow. She has a history of anemia. On discharge his hemoglobin was 7.2. She has had to get blood transfusions in the past. She has a history of a previous GI bleed. She is having tarry stools. She did just restart on her iron supplement. She is staying well hydrated. She is eating multiple small meals a day.   She says that she still feels very weak. Denies fever, chills, nasuea, or vomiting. Reports shortness of breath is stable with only occurs with exertion. Denies shortness of breath with rest. Denies wheezing. Cough has improved. She does feel lighthead when standing. Denies palpitations or chest pain. She feels better than she did when she was discharged. On Monday however, she was able to use her rolling walker to get to the bathroom. Yesterday and today she was felt too weak to do this. Family has been helping with her ADLs.   She has not been checking her BP at home but she can. Her BP  medications were lowered at discharge.      Past Medical History:  Diagnosis Date   Anemia    Arthritis    Ostearthritis- hips, knees, fingers   Basal cell carcinoma (BCC) of right hand 10/2021   Dx by [facility]   DVT (deep venous thrombosis) (HCC)    Dyspnea    GERD (gastroesophageal reflux disease)    Headache(784.0)    tx. Valproic acid   History of diabetes mellitus    History of kidney stones    Hypertension    Hypothyroidism    Pulmonary embolism (HCC)       ROS As per HPI.    Objective:     BP (!) 102/57   Pulse 68   Temp 98.5 F (36.9 C) (Temporal)   Ht 5' 3"  (1.6 m)   SpO2 97%   BMI 37.80 kg/m  BP Readings from Last 3 Encounters:  02/17/22 (!) 102/57  02/12/22 126/64  02/02/22 (!) 127/59      Physical Exam Vitals and nursing note reviewed.  Constitutional:      General: She is not in acute distress.    Appearance: She is not ill-appearing, toxic-appearing or diaphoretic.  HENT:     Head: Normocephalic and atraumatic.     Mouth/Throat:     Mouth: Mucous membranes are moist.     Pharynx: Oropharynx is clear.  Cardiovascular:     Rate and Rhythm: Normal rate and regular rhythm.  Heart sounds: Normal heart sounds. No murmur heard. Pulmonary:     Effort: Pulmonary effort is normal. No respiratory distress.     Breath sounds: Normal breath sounds. No wheezing, rhonchi or rales.  Chest:     Chest wall: No tenderness.  Abdominal:     General: Bowel sounds are normal. There is no distension.     Palpations: Abdomen is soft.     Tenderness: There is no abdominal tenderness. There is no guarding or rebound.  Musculoskeletal:     Right lower leg: No edema.     Left lower leg: No edema.  Skin:    General: Skin is warm and dry.  Neurological:     General: No focal deficit present.     Mental Status: She is alert and oriented to person, place, and time.     Motor: Weakness (generalized) present.     Gait: Gait abnormal (arrives in  wheelchair).  Psychiatric:        Mood and Affect: Mood normal.        Behavior: Behavior normal.      No results found for any visits on 02/17/22.  Last CBC Lab Results  Component Value Date   WBC 10.8 (H) 02/11/2022   HGB 7.2 (L) 02/11/2022   HCT 24.0 (L) 02/11/2022   MCV 99.6 02/11/2022   MCH 29.9 02/11/2022   RDW 13.6 02/11/2022   PLT 178 24/40/1027   Last metabolic panel Lab Results  Component Value Date   GLUCOSE 109 (H) 02/11/2022   NA 137 02/11/2022   K 3.7 02/11/2022   CL 106 02/11/2022   CO2 25 02/11/2022   BUN 25 (H) 02/11/2022   CREATININE 1.11 (H) 02/11/2022   GFRNONAA 50 (L) 02/11/2022   CALCIUM 9.3 02/11/2022   PHOS 3.0 12/30/2020   PROT 5.3 (L) 02/10/2022   ALBUMIN 2.9 (L) 02/10/2022   LABGLOB 2.1 02/02/2022   AGRATIO 2.2 02/02/2022   BILITOT 0.4 02/10/2022   ALKPHOS 69 02/10/2022   AST 13 (L) 02/10/2022   ALT 11 02/10/2022   ANIONGAP 6 02/11/2022      The ASCVD Risk score (Arnett DK, et al., 2019) failed to calculate for the following reasons:   The 2019 ASCVD risk score is only valid for ages 11 to 22    Assessment & Plan:   [name] [name] was seen today for transitions of care.  Diagnoses and all orders for this visit:  Encounter for support and coordination of transition of care Reviewed hospital records.  -     Hemoglobin, fingerstick -     CBC with Differential/Platelet -     BMP8+EGFR  Severe sepsis ([place]) Community acquired pneumonia, unspecified laterality Complete omnicef as prescribed. Lungs clear today on exam today. Improving symptoms.  -     CBC with Differential/Platelet -     BMP8+EGFR  Acute respiratory failure with hypoxia (HCC) Required 02 in the hospital but was discharged on RA.  -     CBC with Differential/Platelet -     BMP8+EGFR  Acute kidney injury ([place]) Kidney function has returned to baseline.  -     CBC with Differential/Platelet -     BMP8+EGFR  Primary hypertension BP is a little soft today. Monitor  closely at home. Notify for systolic <253. -     CBC with Differential/Platelet -     BMP8+EGFR  General weakness Anemia, unspecified type Tarry stools Hemoglobin 7.2 at discharge. Stable today at 7.1. Start iron supplement BID. She  will schedule an appt with GI for evaluation. Discussed weakness due to anemia. Discussed if symptoms worsen, she should go to there ER. Will have her return tomorrow to recheck hemoglobin.  -     Hemoglobin, fingerstick -     CBC with Differential/Platelet -     BMP8+EGFR  Hypokalemia Normal today.  -     CBC with Differential/Platelet -     BMP8+EGFR  Follow up tomorrow to recheck hemoglobin and BP. Strict return precautions given and discussed when to seek emergency care.   The patient indicates understanding of these issues and agrees with the plan.   [name], FNP

## 2022-02-18 ENCOUNTER — Ambulatory Visit (INDEPENDENT_AMBULATORY_CARE_PROVIDER_SITE_OTHER): Payer: PPO

## 2022-02-18 ENCOUNTER — Ambulatory Visit (INDEPENDENT_AMBULATORY_CARE_PROVIDER_SITE_OTHER): Payer: PPO | Admitting: Family Medicine

## 2022-02-18 ENCOUNTER — Encounter: Payer: Self-pay | Admitting: Family Medicine

## 2022-02-18 VITALS — BP 110/57 | HR 84 | Temp 98.7°F | Ht 63.0 in

## 2022-02-18 DIAGNOSIS — I10 Essential (primary) hypertension: Secondary | ICD-10-CM

## 2022-02-18 DIAGNOSIS — J189 Pneumonia, unspecified organism: Secondary | ICD-10-CM

## 2022-02-18 DIAGNOSIS — R0602 Shortness of breath: Secondary | ICD-10-CM | POA: Diagnosis not present

## 2022-02-18 DIAGNOSIS — D649 Anemia, unspecified: Secondary | ICD-10-CM | POA: Diagnosis not present

## 2022-02-18 LAB — CBC WITH DIFFERENTIAL/PLATELET
Basophils Absolute: 0 10*3/uL (ref 0.0–0.2)
Basos: 0 %
EOS (ABSOLUTE): 0.1 10*3/uL (ref 0.0–0.4)
Eos: 1 %
Hematocrit: 23.3 % — ABNORMAL LOW (ref 34.0–46.6)
Hemoglobin: 7.3 g/dL — CL (ref 11.1–15.9)
Immature Grans (Abs): 0.1 10*3/uL (ref 0.0–0.1)
Immature Granulocytes: 1 %
Lymphocytes Absolute: 0.8 10*3/uL (ref 0.7–3.1)
Lymphs: 12 %
MCH: 28.3 pg (ref 26.6–33.0)
MCHC: 31.3 g/dL — ABNORMAL LOW (ref 31.5–35.7)
MCV: 90 fL (ref 79–97)
Monocytes Absolute: 0.6 10*3/uL (ref 0.1–0.9)
Monocytes: 9 %
Neutrophils Absolute: 5.3 10*3/uL (ref 1.4–7.0)
Neutrophils: 77 %
Platelets: 430 10*3/uL (ref 150–450)
RBC: 2.58 x10E6/uL — CL (ref 3.77–5.28)
RDW: 13.7 % (ref 11.7–15.4)
WBC: 6.9 10*3/uL (ref 3.4–10.8)

## 2022-02-18 NOTE — Progress Notes (Signed)
Acute Office Visit  Subjective:     Patient ID: [name] [name], female    DOB: 07-04-1941, 81 y.o.   MRN: [id number]  Chief Complaint  Patient presents with   Anemia    Anemia   Here with son today. Patient is in today for follow up of anemia. She reports that her BP has been stable. She denies dizziness. She continues to feel very weak overall. She was able to get up to walk to the bathroom last night. She is feeling a little short of breath with talking today, which is new from yesterday. She will complete Augmentin for CAP today. Denies fever or chest pain. She did have some sweating the last 2 nights but has kept an eye of her temperature and she has remained afebrile. She has started her iron supplement BID.  ROS As per HPI.      Objective:    BP (!) 110/57   Pulse 84   Temp 98.7 F (37.1 C) (Temporal)   Ht '5\' 3"'$  (1.6 m)   BMI 37.80 kg/m    Physical Exam Vitals and nursing note reviewed.  Constitutional:      General: She is not in acute distress.    Appearance: She is not ill-appearing, toxic-appearing or diaphoretic.  HENT:     Head: Normocephalic and atraumatic.     Mouth/Throat:     Mouth: Mucous membranes are moist.     Pharynx: Oropharynx is clear.  Cardiovascular:     Rate and Rhythm: Normal rate and regular rhythm.     Heart sounds: Normal heart sounds. No murmur heard. Pulmonary:     Effort: Pulmonary effort is normal. No respiratory distress.     Breath sounds: Normal breath sounds. No wheezing, rhonchi or rales.  Chest:     Chest wall: No tenderness.  Abdominal:     General: Bowel sounds are normal. There is no distension.     Palpations: Abdomen is soft.     Tenderness: There is no abdominal tenderness. There is no guarding or rebound.  Musculoskeletal:     Right lower leg: No edema.     Left lower leg: No edema.  Skin:    General: Skin is warm and dry.  Neurological:     General: No focal deficit present.     Mental Status: She is  alert and oriented to person, place, and time.     Motor: Weakness (generalized) present.     Gait: Gait abnormal (arrives in wheelchair).  Psychiatric:        Mood and Affect: Mood normal.        Behavior: Behavior normal.    No results found for any visits on 02/18/22.      Assessment & Plan:   [name] [name] was seen today for anemia.  Diagnoses and all orders for this visit:  Anemia, unspecified type Hbg stable at 7.3 today, slight increase. Continue iron supplement BID. She has GI appt in December, this was first available. Follow up in 1 week. Strict return precautions, discussed when to seek emergency care. -     CBC with Differential/Platelet -     Hemoglobin, fingerstick -     Ambulatory referral to Hematology / Oncology  Shortness of breath Discussed shortness of breath as symptom of anemia vs CAP. Will check CXR today as she will complete augmentin today to CAP. Lungs clear on exam and respirations appear unlabored.   Community acquired pneumonia, unspecified laterality -  DG Chest 2 View; Future  Primary hypertension BP has improved today.      No orders of the defined types were placed in this encounter.   No follow-ups on file.  [name], FNP

## 2022-02-18 NOTE — Patient Instructions (Signed)
Anemia  Anemia is a condition in which there is not enough red blood cells or hemoglobin in the blood. Hemoglobin is a substance in red blood cells that carries oxygen. When you do not have enough red blood cells or hemoglobin (are anemic), your body cannot get enough oxygen and your organs may not work properly. As a result, you may feel very tired or have other problems. What are the causes? Common causes of anemia include: Excessive bleeding. Anemia can be caused by excessive bleeding inside or outside the body, including bleeding from the intestines or from heavy menstrual periods in females. Poor nutrition. Long-lasting (chronic) kidney, thyroid, and liver disease. Bone marrow disorders, spleen problems, and blood disorders. Cancer and treatments for cancer. HIV (human immunodeficiency virus) and AIDS (acquired immunodeficiency syndrome). Infections, medicines, and autoimmune disorders that destroy red blood cells. What are the signs or symptoms? Symptoms of this condition include: Minor weakness. Dizziness. Headache, or difficulties concentrating and sleeping. Heartbeats that feel irregular or faster than normal (palpitations). Shortness of breath, especially with exercise. Pale skin, lips, and nails, or cold hands and feet. Indigestion and nausea. Symptoms may occur suddenly or develop slowly. If your anemia is mild, you may not have symptoms. How is this diagnosed? This condition is diagnosed based on blood tests, your medical history, and a physical exam. In some cases, a test may be needed in which cells are removed from the soft tissue inside of a bone and looked at under a microscope (bone marrow biopsy). Your health care provider may also check your stool (feces) for blood and may do additional testing to look for the cause of your bleeding. Other tests may include: Imaging tests, such as a CT scan or MRI. A procedure to see inside your esophagus and stomach (endoscopy). A  procedure to see inside your colon and rectum (colonoscopy). How is this treated? Treatment for this condition depends on the cause. If you continue to lose a lot of blood, you may need to be treated at a hospital. Treatment may include: Taking supplements of iron, vitamin B12, or folic acid. Taking a hormone medicine (erythropoietin) that can help to stimulate red blood cell growth. Having a blood transfusion. This may be needed if you lose a lot of blood. Making changes to your diet. Having surgery to remove your spleen. Follow these instructions at home: Take over-the-counter and prescription medicines only as told by your health care provider. Take supplements only as told by your health care provider. Follow any diet instructions that you were given by your health care provider. Keep all follow-up visits as told by your health care provider. This is important. Contact a health care provider if: You develop new bleeding anywhere in the body. Get help right away if: You are very weak. You are short of breath. You have pain in your abdomen or chest. You are dizzy or feel faint. You have trouble concentrating. You have bloody stools, black stools, or tarry stools. You vomit repeatedly or you vomit up blood. These symptoms may represent a serious problem that is an emergency. Do not wait to see if the symptoms will go away. Get medical help right away. Call your local emergency services (911 in the [place] Do not drive yourself to the hospital. Summary Anemia is a condition in which you do not have enough red blood cells or enough of a substance in your red blood cells that carries oxygen (hemoglobin). Symptoms may occur suddenly or develop slowly. If your anemia   is mild, you may not have symptoms. This condition is diagnosed with blood tests, a medical history, and a physical exam. Other tests may be needed. Treatment for this condition depends on the cause of the anemia. This  information is not intended to replace advice given to you by your health care provider. Make sure you discuss any questions you have with your health care provider. Document Revised: 05/25/2021 Document Reviewed: 06/18/2019 Elsevier Patient Education  2023 [employer]  

## 2022-02-24 ENCOUNTER — Inpatient Hospital Stay: Payer: PPO | Attending: Hematology | Admitting: Hematology

## 2022-02-24 ENCOUNTER — Inpatient Hospital Stay: Payer: PPO

## 2022-02-24 ENCOUNTER — Ambulatory Visit: Payer: PPO | Admitting: Family Medicine

## 2022-02-24 ENCOUNTER — Encounter: Payer: Self-pay | Admitting: *Deleted

## 2022-02-24 ENCOUNTER — Other Ambulatory Visit: Payer: Self-pay | Admitting: Physician Assistant

## 2022-02-24 VITALS — BP 119/71 | HR 69 | Temp 98.7°F | Resp 19 | Ht 63.0 in | Wt 199.1 lb

## 2022-02-24 VITALS — BP 139/48 | HR 54 | Temp 97.8°F | Resp 18

## 2022-02-24 DIAGNOSIS — K5900 Constipation, unspecified: Secondary | ICD-10-CM | POA: Insufficient documentation

## 2022-02-24 DIAGNOSIS — F5089 Other specified eating disorder: Secondary | ICD-10-CM | POA: Insufficient documentation

## 2022-02-24 DIAGNOSIS — K31811 Angiodysplasia of stomach and duodenum with bleeding: Secondary | ICD-10-CM | POA: Diagnosis not present

## 2022-02-24 DIAGNOSIS — Z86718 Personal history of other venous thrombosis and embolism: Secondary | ICD-10-CM | POA: Diagnosis not present

## 2022-02-24 DIAGNOSIS — E039 Hypothyroidism, unspecified: Secondary | ICD-10-CM | POA: Insufficient documentation

## 2022-02-24 DIAGNOSIS — Z87442 Personal history of urinary calculi: Secondary | ICD-10-CM | POA: Insufficient documentation

## 2022-02-24 DIAGNOSIS — Z85828 Personal history of other malignant neoplasm of skin: Secondary | ICD-10-CM | POA: Insufficient documentation

## 2022-02-24 DIAGNOSIS — Z79899 Other long term (current) drug therapy: Secondary | ICD-10-CM | POA: Insufficient documentation

## 2022-02-24 DIAGNOSIS — D500 Iron deficiency anemia secondary to blood loss (chronic): Secondary | ICD-10-CM

## 2022-02-24 DIAGNOSIS — K5730 Diverticulosis of large intestine without perforation or abscess without bleeding: Secondary | ICD-10-CM | POA: Insufficient documentation

## 2022-02-24 DIAGNOSIS — Z79890 Hormone replacement therapy (postmenopausal): Secondary | ICD-10-CM | POA: Diagnosis not present

## 2022-02-24 DIAGNOSIS — Z7984 Long term (current) use of oral hypoglycemic drugs: Secondary | ICD-10-CM | POA: Diagnosis not present

## 2022-02-24 DIAGNOSIS — Z86711 Personal history of pulmonary embolism: Secondary | ICD-10-CM | POA: Diagnosis not present

## 2022-02-24 DIAGNOSIS — K219 Gastro-esophageal reflux disease without esophagitis: Secondary | ICD-10-CM | POA: Insufficient documentation

## 2022-02-24 DIAGNOSIS — Z7901 Long term (current) use of anticoagulants: Secondary | ICD-10-CM | POA: Insufficient documentation

## 2022-02-24 LAB — CBC WITH DIFFERENTIAL/PLATELET
Abs Immature Granulocytes: 0.08 10*3/uL — ABNORMAL HIGH (ref 0.00–0.07)
Basophils Absolute: 0.1 10*3/uL (ref 0.0–0.1)
Basophils Relative: 1 %
Eosinophils Absolute: 0.2 10*3/uL (ref 0.0–0.5)
Eosinophils Relative: 2 %
HCT: 22.5 % — ABNORMAL LOW (ref 36.0–46.0)
Hemoglobin: 6.7 g/dL — CL (ref 12.0–15.0)
Immature Granulocytes: 1 %
Lymphocytes Relative: 18 %
Lymphs Abs: 1.6 10*3/uL (ref 0.7–4.0)
MCH: 27.7 pg (ref 26.0–34.0)
MCHC: 29.8 g/dL — ABNORMAL LOW (ref 30.0–36.0)
MCV: 93 fL (ref 80.0–100.0)
Monocytes Absolute: 0.8 10*3/uL (ref 0.1–1.0)
Monocytes Relative: 9 %
Neutro Abs: 6.1 10*3/uL (ref 1.7–7.7)
Neutrophils Relative %: 69 %
Platelets: 677 10*3/uL — ABNORMAL HIGH (ref 150–400)
RBC: 2.42 MIL/uL — ABNORMAL LOW (ref 3.87–5.11)
RDW: 15.6 % — ABNORMAL HIGH (ref 11.5–15.5)
WBC: 8.7 10*3/uL (ref 4.0–10.5)
nRBC: 0 % (ref 0.0–0.2)

## 2022-02-24 LAB — VITAMIN B12: Vitamin B-12: 914 pg/mL (ref 180–914)

## 2022-02-24 LAB — RETICULOCYTES
Immature Retic Fract: 35.2 % — ABNORMAL HIGH (ref 2.3–15.9)
RBC.: 2.41 MIL/uL — ABNORMAL LOW (ref 3.87–5.11)
Retic Count, Absolute: 93 10*3/uL (ref 19.0–186.0)
Retic Ct Pct: 3.9 % — ABNORMAL HIGH (ref 0.4–3.1)

## 2022-02-24 LAB — SAMPLE TO BLOOD BANK

## 2022-02-24 LAB — DIRECT ANTIGLOBULIN TEST (NOT AT ARMC)
DAT, IgG: NEGATIVE
DAT, complement: NEGATIVE

## 2022-02-24 LAB — IRON AND TIBC
Iron: 25 ug/dL — ABNORMAL LOW (ref 28–170)
Saturation Ratios: 7 % — ABNORMAL LOW (ref 10.4–31.8)
TIBC: 364 ug/dL (ref 250–450)
UIBC: 339 ug/dL

## 2022-02-24 LAB — FOLATE: Folate: 19.7 ng/mL (ref >5.9)

## 2022-02-24 LAB — LACTATE DEHYDROGENASE: LDH: 74 U/L — ABNORMAL LOW (ref 98–192)

## 2022-02-24 LAB — FERRITIN: Ferritin: 11 ng/mL (ref 11–307)

## 2022-02-24 LAB — PREPARE RBC (CROSSMATCH)

## 2022-02-24 MED ORDER — SODIUM CHLORIDE 0.9% IV SOLUTION
250.0000 mL | Freq: Once | INTRAVENOUS | Status: DC
  Administered 2022-02-24: 250 mL via INTRAVENOUS

## 2022-02-24 MED ORDER — DIPHENHYDRAMINE HCL 25 MG PO CAPS
25.0000 mg | ORAL_CAPSULE | Freq: Once | ORAL | Status: DC
  Administered 2022-02-24: 25 mg via ORAL

## 2022-02-24 MED ORDER — ACETAMINOPHEN 325 MG PO TABS
650.0000 mg | ORAL_TABLET | Freq: Once | ORAL | Status: AC
  Administered 2022-02-24: 650 mg via ORAL

## 2022-02-24 NOTE — Addendum Note (Signed)
Addended by: Derek Jack on: 02/24/2022 11:40 AM   Modules accepted: Orders

## 2022-02-24 NOTE — Patient Instructions (Addendum)
[place] at [facility] Discharge Instructions   You were seen and examined today by Dr. Delton Coombes. He is a blood specialist who is seeing you today for anemia (low hemoglobin).   This anemia is likely due to iron deficiency secondary to blood loss. It sounds like you may be losing blood from your GI tract. We will check iron studies in addition to other lab work to investigate this further.   Return as scheduled to review the results of these labs.    Thank you for choosing [place] at [facility] to provide your oncology and hematology care.  To afford each patient quality time with our provider, please arrive at least 15 minutes before your scheduled appointment time.   If you have a lab appointment with the [facility] please come in thru the Main Entrance and check in at the main information desk.  You need to re-schedule your appointment should you arrive 10 or more minutes late.  We strive to give you quality time with our providers, and arriving late affects you and other patients whose appointments are after yours.  Also, if you no show three or more times for appointments you may be dismissed from the clinic at the providers discretion.     Again, thank you for choosing [facility].  Our hope is that these requests will decrease the amount of time that you wait before being seen by our physicians.       _____________________________________________________________  Should you have questions after your visit to [facility], please contact our office at [phone] and follow the prompts.  Our office hours are 8:00 a.m. and 4:30 p.m. Monday - Friday.  Please note that voicemails left after 4:00 p.m. may not be returned until the following business day.  We are closed weekends and major holidays.  You do have access to a nurse 24-7, just call the main number to the clinic [phone] and do not press any  options, hold on the line and a nurse will answer the phone.    For prescription refill requests, have your pharmacy contact our office and allow 72 hours.    Due to Covid, you will need to wear a mask upon entering the hospital. If you do not have a mask, a mask will be given to you at the Main Entrance upon arrival. For doctor visits, patients may have 1 support person age 26 or older with them. For treatment visits, patients can not have anyone with them due to social distancing guidelines and our immunocompromised population.

## 2022-02-24 NOTE — Progress Notes (Signed)
[facility] [address], [place] [address]   CLINIC:  Medical Oncology/Hematology  Patient Care Team: [name], FNP as PCP - General (Family Medicine) Minus Breeding, MD as PCP - Cardiology (Cardiology) Ilean China, RN as Case Manager [name], NP (Gastroenterology) Minus Breeding, MD as Consulting Physician (Cardiology) Celestia Khat, Hanna (Optometry) Lavera Guise, [facility] as Pharmacist (Family Medicine) Lendon Colonel, NP as Nurse Practitioner (Cardiology) [facility], Melanie Crazier, MD as Attending Physician (Endocrinology) Clarene Essex, MD as Consulting Physician (Gastroenterology) Janith Lima, MD as Consulting Physician (Urology)  CHIEF COMPLAINTS/PURPOSE OF CONSULTATION:  Evaluation for anemia  HISTORY OF PRESENTING ILLNESS:  [name] [name] 81 y.o. female is here because of anemia, at the request of [facility].  Today she reports feeling well. She denies hematuria and hematochezia. She reports history of prior blood transfusions, and an unprovoked DVT and PE in 2020 following which she was placed on Eliquis and iron tablets. She reports black stools. Iron tablets were stopped in January. She reports constipation when she previously took iron tablets. She reports fatigue. Her appetite is good, and she denies weight loss. She reports occasional sharp abdominal pains and occasional ankle swellings.   She lives at home with her son, and she is able to do her typical daily activities. 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 She denies smoking history. Her sister is anemic. She denies family history of sickle cell disease and thalassemia. Her mother had uterine cancer, and her sister had colon cancer.   MEDICAL HISTORY:  Past Medical History:  Diagnosis Date   Anemia    Arthritis    Ostearthritis- hips, knees, fingers   Basal cell carcinoma (BCC) of right hand 10/2021   Dx by [facility]   DVT (deep venous thrombosis)  (HCC)    Dyspnea    GERD (gastroesophageal reflux disease)    Headache(784.0)    tx. Valproic acid   History of diabetes mellitus    History of kidney stones    Hypertension    Hypothyroidism    Pulmonary embolism (HCC)     SURGICAL HISTORY: Past Surgical History:  Procedure Laterality Date   ABDOMINAL HYSTERECTOMY     BILATERAL HIP ARTHROSCOPY Left    BIOPSY  12/08/2019   Procedure: BIOPSY;  Surgeon: Daneil Dolin, MD;  [facility];  Service: Endoscopy;;   CATARACT EXTRACTION, BILATERAL     COLONOSCOPY N/A 12/08/2019   polyps (tubular adenoma), diverticulosis, colonic lipoma, no surveillance due to age   35 W/ [place] Right 12/25/2020   Procedure: CYSTOSCOPY WITH RETROGRADE PYELOGRAM/URETERAL STENT PLACEMENT;  Surgeon: Janith Lima, MD;  [facility]: [facility];  Service: Urology;  Laterality: Right;   CYSTOSCOPY/URETEROSCOPY/HOLMIUM LASER/STENT PLACEMENT Right 01/15/2021   Procedure: CYSTOSCOPY/RETROGRADE/URETEROSCOPY/HOLMIUM LASER/STENT EXCHANGE;  Surgeon: Janith Lima, MD;  [facility];  Service: Urology;  Laterality: Right;   ESOPHAGOGASTRODUODENOSCOPY N/A 12/08/2019   normal esophagus with 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   ESOPHAGOGASTRODUODENOSCOPY (EGD) WITH PROPOFOL N/A 05/18/2021   Procedure: ESOPHAGOGASTRODUODENOSCOPY (EGD) WITH PROPOFOL;  Surgeon: Clarene Essex, MD;  [facility];  Service: Endoscopy;  Laterality: N/A;  RFA; APC; ultraslim scope   GIVENS CAPSULE STUDY N/A 01/13/2020   Procedure: GIVENS CAPSULE STUDY;  Surgeon: Daneil Dolin, MD;  [facility];  Service: Endoscopy;  Laterality: N/A;  7:30am   GIVENS CAPSULE STUDY N/A 05/04/2021   Procedure: GIVENS CAPSULE STUDY;  Surgeon: Clarene Essex, MD;  [facility]: [facility];  Service: Endoscopy;  Laterality: N/A;   MULTIPLE TOOTH EXTRACTIONS     60's   PARATHYROIDECTOMY     POLYPECTOMY  12/08/2019   Procedure:  POLYPECTOMY;  Surgeon: Daneil Dolin, MD;  [facility];  Service: Endoscopy;;   RADIOFREQUENCY ABLATION  05/18/2021   Procedure: RADIO FREQUENCY ABLATION;  Surgeon: Clarene Essex, MD;  [facility];  Service: Endoscopy;;   TEE WITHOUT CARDIOVERSION N/A 07/06/2021   Procedure: TRANSESOPHAGEAL ECHOCARDIOGRAM (TEE);  Surgeon: Donato Heinz, MD;  Location: [facility];  Service: Cardiovascular;  Laterality: N/A;   TOTAL HIP ARTHROPLASTY Right 10/29/2013   Procedure: RIGHT TOTAL HIP ARTHROPLASTY ANTERIOR APPROACH;  Surgeon: Mauri Pole, MD;  [facility];  Service: Orthopedics;  Laterality: Right;    SOCIAL HISTORY: Social History   Socioeconomic History   Marital status: Widowed    Spouse name: Not on file   Number of children: 4   Years of education: 12   Highest education level: High school graduate  Occupational History   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 She has four adult children and one son lives with her.    Social Determinants of Health   Financial Resource Strain: Low Risk  (12/23/2021)   Overall Financial Resource Strain (CARDIA)    Difficulty of Paying Living Expenses: Not hard at all  Food Insecurity: No Food Insecurity (12/23/2021)   Hunger Vital Sign    Worried About Running Out of Food in the Last Year: Never true    Ran Out of Food in the Last Year: Never true  Transportation Needs: No Transportation Needs (12/23/2021)   PRAPARE - Hydrologist (Medical): No    Lack of Transportation (Non-Medical): No  Physical Activity:  Insufficiently Active (12/23/2021)   Exercise Vital Sign    Days of Exercise per Week: 7 days    Minutes of Exercise per Session: 20 min  Stress: No Stress Concern Present (12/23/2021)   [place]    Feeling of Stress : Not at all  Social Connections: Moderately Integrated (12/23/2021)   Social Connection and Isolation Panel [NHANES]    Frequency of Communication with Friends and Family: More than three times a week    Frequency of Social Gatherings with Friends and Family: More than three times a week    Attends Religious Services: More than 4 times per year    Active Member of [employer] or Organizations: Yes    Attends Archivist Meetings: More than 4 times per year    Marital Status: Widowed  Intimate Partner Violence: Not At Risk (12/23/2021)   Humiliation, Afraid, Rape, and Kick questionnaire    Fear of Current or Ex-Partner: No    Emotionally Abused: No    Physically Abused: No    Sexually Abused: No    FAMILY HISTORY: Family History  Problem Relation Age of Onset   Colitis Sister 71  alive   Cancer Sister 48       colon   Cancer Mother 62       uterine   Heart disease Father 38       heart failure   Heart attack Brother 62   Healthy Daughter    Healthy Son    Pulmonary embolism Brother 52   Heart disease Brother    Healthy Son    Healthy Son     ALLERGIES:  has No Known Allergies.  MEDICATIONS:  Current Outpatient Medications  Medication Sig Dispense Refill   acetaminophen (TYLENOL) 325 MG tablet Take 2 tablets (650 mg total) by mouth every 6 (six) hours as needed for mild pain or headache (or Fever >/= 101). 12 tablet 0   albuterol (VENTOLIN HFA) 108 (90 Base) MCG/ACT inhaler Inhale 2 puffs into the lungs every 6 (six) hours as needed for wheezing or shortness of breath. 8 g 2   allopurinol (ZYLOPRIM) 100 MG tablet Take 1 tablet (100 mg total) by mouth daily. 90 tablet 1   amLODipine  (NORVASC) 2.5 MG tablet Take 1 tablet (2.5 mg total) by mouth daily. 30 tablet 2   apixaban (ELIQUIS) 2.5 MG TABS tablet Take 1 tablet (2.5 mg total) by mouth 2 (two) times daily. 180 tablet 1   atorvastatin (LIPITOR) 20 MG tablet Take 1 tablet (20 mg total) by mouth daily. 90 tablet 1   dapagliflozin propanediol (FARXIGA) 10 MG TABS tablet Take 1 tablet (10 mg total) by mouth daily. 90 tablet 1   dextromethorphan-guaiFENesin (MUCINEX DM) 30-600 MG 12hr tablet Take 1 tablet by mouth 2 (two) times daily for 20 days. 40 tablet 0   docusate sodium (COLACE) 100 MG capsule Take 1 capsule (100 mg total) by mouth 2 (two) times daily. 60 capsule 1   furosemide (LASIX) 20 MG tablet Take 1 tablet (20 mg total) by mouth daily. 90 tablet 2   GNP CRANBERRY EXTRACT PO Take 500 mg by mouth in the morning.     levothyroxine (SYNTHROID) 50 MCG tablet TAKE 1 TABLET BY MOUTH ONCE DAILY BEFORE BREAKFAST 90 tablet 3   loratadine (CLARITIN) 10 MG tablet Take 10 mg by mouth daily.     oxybutynin (DITROPAN-XL) 10 MG 24 hr tablet Take 1 tablet (10 mg total) by mouth at bedtime. 90 tablet 1   pantoprazole (PROTONIX) 40 MG tablet Take 1 tablet (40 mg total) by mouth 2 (two) times daily before a meal. 180 tablet 1   polyethylene glycol (MIRALAX / GLYCOLAX) 17 g packet Take 17 g by mouth daily as needed for mild constipation. 28 each 1   vitamin B-12 (CYANOCOBALAMIN) 50 MCG tablet Take 50 mcg by mouth daily.     Vitamin D3 (VITAMIN D) 25 MCG tablet Take 1,000 Units by mouth daily.     No current facility-administered medications for this visit.    REVIEW OF SYSTEMS:   Review of Systems  Constitutional:  Positive for fatigue. Negative for appetite change and unexpected weight change.  Respiratory:  Positive for shortness of breath.   Cardiovascular:  Positive for leg swelling (ankle).  Gastrointestinal:  Positive for abdominal pain and nausea. Negative for blood in stool.       Black stools  Genitourinary:  Negative for  hematuria.   Psychiatric/Behavioral:  Positive for sleep disturbance.   All other systems reviewed and are negative.    PHYSICAL EXAMINATION: ECOG PERFORMANCE STATUS: 1 - Symptomatic but completely ambulatory  Vitals:   02/24/22 5462  BP: 119/71  Pulse: 69  Resp: 19  Temp: 98.7 F (37.1 C)  SpO2: 98%   Filed Weights   02/24/22 0822  Weight: 199 lb 1.6 oz (90.3 kg)   Physical Exam Vitals reviewed.  Constitutional:      Appearance: Normal appearance. She is obese.     Comments: In wheelchair  Cardiovascular:     Rate and Rhythm: Normal rate and regular rhythm.     Pulses: Normal pulses.     Heart sounds: Normal heart sounds.  Pulmonary:     Effort: Pulmonary effort is normal.     Breath sounds: Normal breath sounds.  Abdominal:     Palpations: Abdomen is soft. There is no mass.     Tenderness: There is no abdominal tenderness.  Musculoskeletal:     Right lower leg: No edema.     Left lower leg: No edema.  Lymphadenopathy:     Cervical: No cervical adenopathy.     Right cervical: No superficial, deep or posterior cervical adenopathy.    Left cervical: No superficial, deep or posterior cervical adenopathy.     Upper Body:     Right upper body: No supraclavicular or axillary adenopathy.     Left upper body: No supraclavicular or axillary adenopathy.  Neurological:     General: No focal deficit present.     Mental Status: She is alert and oriented to person, place, and time.  Psychiatric:        Mood and Affect: Mood normal.        Behavior: Behavior normal.      LABORATORY DATA:  I have reviewed the data as listed Recent Results (from the past 2160 hour(s))  CBC with Differential/Platelet     Status: Abnormal   Collection Time: 02/02/22 10:45 AM  Result Value Ref Range   WBC 7.1 3.4 - 10.8 x10E3/uL   RBC 3.20 (L) 3.77 - 5.28 x10E6/uL   Hemoglobin 9.7 (L) 11.1 - 15.9 g/dL   Hematocrit 30.1 (L) 34.0 - 46.6 %   MCV 94 79 - 97 fL   MCH 30.3 26.6 - 33.0 pg    MCHC 32.2 31.5 - 35.7 g/dL   RDW 12.4 11.7 - 15.4 %   Platelets 337 150 - 450 x10E3/uL   Neutrophils 69 Not Estab. %   Lymphs 18 Not Estab. %   Monocytes 10 Not Estab. %   Eos 2 Not Estab. %   Basos 0 Not Estab. %   Neutrophils Absolute 4.9 1.4 - 7.0 x10E3/uL   Lymphocytes Absolute 1.3 0.7 - 3.1 x10E3/uL   Monocytes Absolute 0.7 0.1 - 0.9 x10E3/uL   EOS (ABSOLUTE) 0.1 0.0 - 0.4 x10E3/uL   Basophils Absolute 0.0 0.0 - 0.2 x10E3/uL   Immature Granulocytes 1 Not Estab. %   Immature Grans (Abs) 0.1 0.0 - 0.1 x10E3/uL  CMP14+EGFR     Status: Abnormal   Collection Time: 02/02/22 10:45 AM  Result Value Ref Range   Glucose 125 (H) 70 - 99 mg/dL   BUN 39 (H) 8 - 27 mg/dL   Creatinine, Ser 1.83 (H) 0.57 - 1.00 mg/dL   eGFR 28 (L) >59 mL/min/1.73   BUN/Creatinine Ratio 21 12 - 28   Sodium 141 134 - 144 mmol/L   Potassium 3.8 3.5 - 5.2 mmol/L   Chloride 101 96 - 106 mmol/L   CO2 23 20 - 29 mmol/L   Calcium 11.3 (H) 8.7 - 10.3 mg/dL   Total Protein 6.7 6.0 - 8.5 g/dL  Albumin 4.6 3.8 - 4.8 g/dL    Comment:               **Please note reference interval change**   Globulin, Total 2.1 1.5 - 4.5 g/dL   Albumin/Globulin Ratio 2.2 1.2 - 2.2   Bilirubin Total 0.2 0.0 - 1.2 mg/dL   Alkaline Phosphatase 109 44 - 121 IU/L   AST 12 0 - 40 IU/L   ALT 9 0 - 32 IU/L  Comprehensive metabolic panel     Status: Abnormal   Collection Time: 02/09/22  8:13 PM  Result Value Ref Range   Sodium 136 135 - 145 mmol/L   Potassium 2.3 (LL) 3.5 - 5.1 mmol/L    Comment: CRITICAL RESULT CALLED TO, READ BACK BY AND VERIFIED WITH: MERRICK,L ON 02/09/22 AT 2055 BY LOY,C    Chloride 102 98 - 111 mmol/L   CO2 24 22 - 32 mmol/L   Glucose, Bld 136 (H) 70 - 99 mg/dL    Comment: Glucose reference range applies only to samples taken after fasting for at least 8 hours.   BUN 28 (H) 8 - 23 mg/dL   Creatinine, Ser 1.60 (H) 0.44 - 1.00 mg/dL   Calcium 9.7 8.9 - 10.3 mg/dL   Total Protein 5.9 (L) 6.5 - 8.1 g/dL    Albumin 3.4 (L) 3.5 - 5.0 g/dL   AST 15 15 - 41 U/L   ALT 13 0 - 44 U/L   Alkaline Phosphatase 80 38 - 126 U/L   Total Bilirubin 0.6 0.3 - 1.2 mg/dL   GFR, Estimated 32 (L) >60 mL/min    Comment: (NOTE) Calculated using the CKD-EPI Creatinine Equation (2021)    Anion gap 10 5 - 15    Comment: Performed at [facility], [address] [place], [place] [address]  Lactic acid, plasma     Status: Abnormal   Collection Time: 02/09/22  8:13 PM  Result Value Ref Range   Lactic Acid, Venous 2.1 (HH) 0.5 - 1.9 mmol/L    Comment: CRITICAL RESULT CALLED TO, READ BACK BY AND VERIFIED WITH: PRUITT,G ON 02/09/22 AT 2045 BY LOY,C Performed at [facility], [address], [place], [place] [address]   CBC with Differential     Status: Abnormal   Collection Time: 02/09/22  8:13 PM  Result Value Ref Range   WBC 10.8 (H) 4.0 - 10.5 K/uL   RBC 2.64 (L) 3.87 - 5.11 MIL/uL   Hemoglobin 8.1 (L) 12.0 - 15.0 g/dL   HCT 26.1 (L) 36.0 - 46.0 %   MCV 98.9 80.0 - 100.0 fL   MCH 30.7 26.0 - 34.0 pg   MCHC 31.0 30.0 - 36.0 g/dL   RDW 14.3 11.5 - 15.5 %   Platelets 189 150 - 400 K/uL   nRBC 0.0 0.0 - 0.2 %   Neutrophils Relative % 84 %   Neutro Abs 9.2 (H) 1.7 - 7.7 K/uL   Lymphocytes Relative 7 %   Lymphs Abs 0.7 0.7 - 4.0 K/uL   Monocytes Relative 8 %   Monocytes Absolute 0.8 0.1 - 1.0 K/uL   Eosinophils Relative 0 %   Eosinophils Absolute 0.0 0.0 - 0.5 K/uL   Basophils Relative 0 %   Basophils Absolute 0.0 0.0 - 0.1 K/uL   Immature Granulocytes 1 %   Abs Immature Granulocytes 0.13 (H) 0.00 - 0.07 K/uL    Comment: Performed at [facility], [address] [place], [place] [address]  Protime-INR  Status: Abnormal   Collection Time: 02/09/22  8:13 PM  Result Value Ref Range   Prothrombin Time 17.0 (H) 11.4 - 15.2 seconds   INR 1.4 (H) 0.8 - 1.2    Comment: (NOTE) INR goal varies based on device and disease states. Performed at [facility], [address], [place], [place] [address]   Culture,  blood (Routine x 2)     Status: Abnormal   Collection Time: 02/09/22  8:13 PM   Specimen: Right Antecubital; Blood  Result Value Ref Range   Specimen Description      RIGHT ANTECUBITAL Performed at [facility], [address] [place], [place] [address]    Special Requests      BOTTLES DRAWN AEROBIC AND ANAEROBIC Blood Culture results may not be optimal due to an excessive volume of blood received in culture bottles Performed at [facility], [address], [place], [place] [address]    Culture  Setup Time      GRAM POSITIVE COCCI ANAEROBIC BOTTLE Gram Stain Report Called to,Read Back By and Verified With: TINAJERO,[email or web address]  BY MATTHEWS, B 7.21.2023 CRITICAL RESULT CALLED TO, READ BACK BY AND VERIFIED WITH: PHARMD G COFFEE [id number] AT 1033 AM BY CM    Culture (A)     STAPHYLOCOCCUS EPIDERMIDIS THE SIGNIFICANCE OF ISOLATING THIS ORGANISM FROM A SINGLE SET OF BLOOD CULTURES WHEN MULTIPLE SETS ARE DRAWN IS UNCERTAIN. PLEASE NOTIFY THE MICROBIOLOGY DEPARTMENT WITHIN ONE WEEK IF SPECIATION AND SENSITIVITIES ARE REQUIRED. Performed at [facility], [place] [address], [place], [place] [address]    Report Status 02/13/2022 FINAL   Culture, blood (Routine x 2)     Status: None   Collection Time: 02/09/22  8:13 PM   Specimen: BLOOD LEFT ARM  Result Value Ref Range   Specimen Description BLOOD LEFT ARM    Special Requests      BOTTLES DRAWN AEROBIC AND ANAEROBIC Blood Culture adequate volume   Culture      NO GROWTH 5 DAYS Performed at [facility], [address] [place], [place] [address]    Report Status 02/14/2022 FINAL   APTT     Status: None   Collection Time: 02/09/22  8:13 PM  Result Value Ref Range   aPTT 32 24 - 36 seconds    Comment: Performed at [facility], [address] [place],  [address]  Blood Culture ID Panel (Reflexed)     Status: Abnormal   Collection Time: 02/09/22  8:13 PM  Result Value Ref Range   Enterococcus faecalis NOT DETECTED NOT DETECTED    Enterococcus Faecium NOT DETECTED NOT DETECTED   Listeria monocytogenes NOT DETECTED NOT DETECTED   Staphylococcus species DETECTED (A) NOT DETECTED    Comment: CRITICAL RESULT CALLED TO, READ BACK BY AND VERIFIED WITH: PHARMD G COFFEE [id number] AT 1033 AM BY CM    Staphylococcus aureus (BCID) NOT DETECTED NOT DETECTED   Staphylococcus epidermidis DETECTED (A) NOT DETECTED    Comment: Methicillin (oxacillin) resistant coagulase negative staphylococcus. Possible blood culture contaminant (unless isolated from more than one blood culture draw or clinical case suggests pathogenicity). No antibiotic treatment is indicated for blood  culture contaminants. CRITICAL RESULT CALLED TO, READ BACK BY AND VERIFIED WITH: PHARMD G COFFEE [id number] AT 3557 AM BY CM    Staphylococcus lugdunensis NOT DETECTED NOT DETECTED   Streptococcus species NOT DETECTED NOT DETECTED   Streptococcus agalactiae NOT DETECTED NOT DETECTED   Streptococcus pneumoniae NOT DETECTED NOT DETECTED   Streptococcus pyogenes NOT  DETECTED NOT DETECTED   A.calcoaceticus-baumannii NOT DETECTED NOT DETECTED   Bacteroides fragilis NOT DETECTED NOT DETECTED   Enterobacterales NOT DETECTED NOT DETECTED   Enterobacter cloacae complex NOT DETECTED NOT DETECTED   Escherichia coli NOT DETECTED NOT DETECTED   Klebsiella aerogenes NOT DETECTED NOT DETECTED   Klebsiella oxytoca NOT DETECTED NOT DETECTED   Klebsiella pneumoniae NOT DETECTED NOT DETECTED   Proteus species NOT DETECTED NOT DETECTED   Salmonella species NOT DETECTED NOT DETECTED   Serratia marcescens NOT DETECTED NOT DETECTED   Haemophilus influenzae NOT DETECTED NOT DETECTED   Neisseria meningitidis NOT DETECTED NOT DETECTED   Pseudomonas aeruginosa NOT DETECTED NOT DETECTED   Stenotrophomonas maltophilia NOT DETECTED NOT DETECTED   Candida albicans NOT DETECTED NOT DETECTED   Candida auris NOT DETECTED NOT DETECTED   Candida glabrata NOT DETECTED NOT DETECTED   Candida krusei  NOT DETECTED NOT DETECTED   Candida parapsilosis NOT DETECTED NOT DETECTED   Candida tropicalis NOT DETECTED NOT DETECTED   Cryptococcus neoformans/gattii NOT DETECTED NOT DETECTED   Methicillin resistance mecA/C DETECTED (A) NOT DETECTED    Comment: CRITICAL RESULT CALLED TO, READ BACK BY AND VERIFIED WITH: PHARMD G COFFEE [id number] AT 1033 BY CM Performed at [facility], [place] [address] [place], [place] [address]   Resp Panel by RT-PCR (Flu A&B, Covid) Anterior Nasal Swab     Status: None   Collection Time: 02/09/22  8:34 PM   Specimen: Anterior Nasal Swab  Result Value Ref Range   SARS Coronavirus 2 by RT PCR NEGATIVE NEGATIVE    Comment: (NOTE) SARS-CoV-2 target nucleic acids are NOT DETECTED.  The SARS-CoV-2 RNA is generally detectable in upper respiratory specimens during the acute phase of infection. The lowest concentration of SARS-CoV-2 viral copies this assay can detect is 138 copies/mL. A negative result does not preclude SARS-Cov-2 infection and should not be used as the sole basis for treatment or other patient management decisions. A negative result may occur with  improper specimen collection/handling, submission of specimen other than nasopharyngeal swab, presence of viral mutation(s) within the areas targeted by this assay, and inadequate number of viral copies(<138 copies/mL). A negative result must be combined with clinical observations, patient history, and epidemiological information. The expected result is Negative.  Fact Sheet for Patients:  EntrepreneurPulse.com.au  Fact Sheet for Healthcare Providers:  IncredibleEmployment.be  This test is no t yet approved or cleared by the [place] FDA and  has been authorized for detection and/or diagnosis of SARS-CoV-2 by FDA under an Emergency Use Authorization (EUA). This EUA will remain  in effect (meaning this test can be used) for the duration of the COVID-19  declaration under Section 564(b)(1) of the Act, 21 U.S.C.section 360bbb-3(b)(1), unless the authorization is terminated  or revoked sooner.       Influenza A by PCR NEGATIVE NEGATIVE   Influenza B by PCR NEGATIVE NEGATIVE    Comment: (NOTE) The Xpert Xpress SARS-CoV-2/FLU/RSV plus assay is intended as an aid in the diagnosis of influenza from Nasopharyngeal swab specimens and should not be used as a sole basis for treatment. Nasal washings and aspirates are unacceptable for Xpert Xpress SARS-CoV-2/FLU/RSV testing.  Fact Sheet for Patients: EntrepreneurPulse.com.au  Fact Sheet for Healthcare Providers: IncredibleEmployment.be  This test is not yet approved or cleared by the [place] FDA and has been authorized for detection and/or diagnosis of SARS-CoV-2 by FDA under an Emergency Use Authorization (EUA). This EUA will remain in effect (meaning this test  can be used) for the duration of the COVID-19 declaration under Section 564(b)(1) of the Act, 21 U.S.C. section 360bbb-3(b)(1), unless the authorization is terminated or revoked.  Performed at [facility], [address] [place], [place] [address]   Lactic acid, plasma     Status: None   Collection Time: 02/09/22 11:01 PM  Result Value Ref Range   Lactic Acid, Venous 1.1 0.5 - 1.9 mmol/L    Comment: Performed at [facility] [place], [address] [place], [place] [address]  Magnesium     Status: Abnormal   Collection Time: 02/09/22 11:01 PM  Result Value Ref Range   Magnesium 1.6 (L) 1.7 - 2.4 mg/dL    Comment: Performed at [facility], [address], [place], [place] [address]  Urinalysis, Routine w reflex microscopic Urine, Clean Catch     Status: Abnormal   Collection Time: 02/10/22  2:20 AM  Result Value Ref Range   Color, Urine YELLOW YELLOW   APPearance CLEAR CLEAR   Specific Gravity, Urine 1.007 1.005 - 1.030   pH 5.0 5.0 - 8.0   Glucose, UA >=500 (A) NEGATIVE mg/dL   Hgb urine  dipstick MODERATE (A) NEGATIVE   Bilirubin Urine NEGATIVE NEGATIVE   Ketones, ur NEGATIVE NEGATIVE mg/dL   Protein, ur 30 (A) NEGATIVE mg/dL   Nitrite POSITIVE (A) NEGATIVE   Leukocytes,Ua MODERATE (A) NEGATIVE   RBC / HPF 6-10 0 - 5 RBC/hpf   WBC, UA >50 (H) 0 - 5 WBC/hpf   Bacteria, UA RARE (A) NONE SEEN   Squamous Epithelial / LPF 0-5 0 - 5   WBC Clumps PRESENT     Comment: Performed at [facility], [address], [place], [place] [address]  Urine Culture     Status: None   Collection Time: 02/10/22  2:20 AM   Specimen: Urine, Clean Catch  Result Value Ref Range   Specimen Description      URINE, CLEAN CATCH Performed at [facility], [address], [place], [place] [address]    Special Requests      NONE Performed at [facility], [address] [address], [place], [place] [address]    Culture      NO GROWTH Performed at [facility], [place] [address], [place], [place] [address]    Report Status 02/11/2022 FINAL   Legionella Pneumophila Serogp 1 Ur Ag     Status: None   Collection Time: 02/10/22  2:20 AM  Result Value Ref Range   L. pneumophila Serogp 1 Ur Ag Negative Negative    Comment: (NOTE) Presumptive negative for L. pneumophila serogroup 1 antigen in urine, suggesting no recent or current infection. Legionnaires' disease cannot be ruled out since other serogroups and species may also cause disease. Performed At: [facility] [place], [place] [id number] Rush Farmer MD RA:[id number]    Source of Sample URINE, CLEAN CATCH     Comment: Performed at [facility], [address] [place], [place] [address]  Strep pneumoniae urinary antigen     Status: None   Collection Time: 02/10/22  2:20 AM  Result Value Ref Range   Strep Pneumo Urinary Antigen NEGATIVE NEGATIVE    Comment:        Infection due to S. pneumoniae cannot be absolutely ruled out since the antigen present may be below the detection limit of the test. Performed at [facility], [address], [place], [place] [address]   Protime-INR     Status:  Abnormal   Collection Time: 02/10/22  4:42 AM  Result Value Ref Range   Prothrombin Time 18.6 (H) 11.4 - 15.2 seconds   INR 1.6 (H) 0.8 - 1.2    Comment: (NOTE) INR goal varies based on device and disease states. Performed at [facility], [address], [place], [place] [address]   Cortisol-am, blood     Status: None   Collection Time: 02/10/22  4:42 AM  Result Value Ref Range   Cortisol - AM 15.1 6.7 - 22.6 ug/dL    Comment: Performed at [facility] [address], [place], [place] [address]  Procalcitonin     Status: None   Collection Time: 02/10/22  4:42 AM  Result Value Ref Range   Procalcitonin 136.48 ng/mL    Comment:        Interpretation: PCT >= 10 ng/mL: Important systemic inflammatory response, almost exclusively due to severe bacterial sepsis or septic shock. (NOTE)       Sepsis PCT Algorithm           Lower Respiratory Tract                                      Infection PCT Algorithm    ----------------------------     ----------------------------         PCT < 0.25 ng/mL                PCT < 0.10 ng/mL          Strongly encourage             Strongly discourage   discontinuation of antibiotics    initiation of antibiotics    ----------------------------     -----------------------------       PCT 0.25 - 0.50 ng/mL            PCT 0.10 - 0.25 ng/mL               OR       >80% decrease in PCT            Discourage initiation of                                            antibiotics      Encourage discontinuation           of antibiotics    ----------------------------     -----------------------------         PCT >= 0.50 ng/mL              PCT 0.26 - 0.50 ng/mL                AND       <80% decrease in PCT             Encourage initiation of                                             antibiotics       Encourage continuation           of antibiotics     ----------------------------     -----------------------------  PCT >= 0.50 ng/mL                  PCT > 0.50 ng/mL               AND         increase in PCT                  Strongly encourage                                      initiation of antibiotics    Strongly encourage escalation           of antibiotics                                     -----------------------------                                           PCT <= 0.25 ng/mL                                                 OR                                        > 80% decrease in PCT                                      Discontinue / Do not initiate                                             antibiotics  Performed at [facility], [address], [place], [place] [address]   Comprehensive metabolic panel     Status: Abnormal   Collection Time: 02/10/22  4:42 AM  Result Value Ref Range   Sodium 137 135 - 145 mmol/L   Potassium 3.6 3.5 - 5.1 mmol/L    Comment: DELTA CHECK NOTED   Chloride 105 98 - 111 mmol/L   CO2 24 22 - 32 mmol/L   Glucose, Bld 113 (H) 70 - 99 mg/dL    Comment: Glucose reference range applies only to samples taken after fasting for at least 8 hours.   BUN 28 (H) 8 - 23 mg/dL   Creatinine, Ser 1.45 (H) 0.44 - 1.00 mg/dL   Calcium 9.2 8.9 - 10.3 mg/dL   Total Protein 5.3 (L) 6.5 - 8.1 g/dL   Albumin 2.9 (L) 3.5 - 5.0 g/dL   AST 13 (L) 15 - 41 U/L   ALT 11 0 - 44 U/L   Alkaline Phosphatase 69 38 - 126 U/L   Total Bilirubin 0.4 0.3 - 1.2 mg/dL   GFR, Estimated 36 (L) >60 mL/min    Comment: (NOTE) Calculated using the CKD-EPI Creatinine Equation (2021)    Anion  gap 8 5 - 15    Comment: Performed at [facility], [address] [place], [place] [address]  Magnesium     Status: None   Collection Time: 02/10/22  4:42 AM  Result Value Ref Range   Magnesium 2.3 1.7 - 2.4 mg/dL    Comment: Performed at [facility], [address] [place], [place] [address]  CBC with Differential/Platelet      Status: Abnormal   Collection Time: 02/10/22  4:42 AM  Result Value Ref Range   WBC 16.0 (H) 4.0 - 10.5 K/uL   RBC 2.42 (L) 3.87 - 5.11 MIL/uL   Hemoglobin 7.2 (L) 12.0 - 15.0 g/dL   HCT 24.7 (L) 36.0 - 46.0 %   MCV 102.1 (H) 80.0 - 100.0 fL   MCH 29.8 26.0 - 34.0 pg   MCHC 29.1 (L) 30.0 - 36.0 g/dL   RDW 14.1 11.5 - 15.5 %   Platelets 174 150 - 400 K/uL   nRBC 0.0 0.0 - 0.2 %   Neutrophils Relative % 78 %   Neutro Abs 12.5 (H) 1.7 - 7.7 K/uL   Lymphocytes Relative 10 %   Lymphs Abs 1.6 0.7 - 4.0 K/uL   Monocytes Relative 11 %   Monocytes Absolute 1.7 (H) 0.1 - 1.0 K/uL   Eosinophils Relative 0 %   Eosinophils Absolute 0.0 0.0 - 0.5 K/uL   Basophils Relative 0 %   Basophils Absolute 0.0 0.0 - 0.1 K/uL   Immature Granulocytes 1 %   Abs Immature Granulocytes 0.13 (H) 0.00 - 0.07 K/uL    Comment: Performed at [facility], [address], [place], [place] [address]  TSH     Status: None   Collection Time: 02/10/22  4:42 AM  Result Value Ref Range   TSH 0.984 0.350 - 4.500 uIU/mL    Comment: Performed by a 3rd Generation assay with a functional sensitivity of <=0.01 uIU/mL. Performed at [facility], [address], [place], [place] [address]   MRSA Next Gen by PCR, Nasal     Status: None   Collection Time: 02/10/22  9:50 AM   Specimen: Nasal Mucosa; Nasal Swab  Result Value Ref Range   MRSA by PCR Next Gen NOT DETECTED NOT DETECTED    Comment: (NOTE) The GeneXpert MRSA Assay (FDA approved for NASAL specimens only), is one component of a comprehensive MRSA colonization surveillance program. It is not intended to diagnose MRSA infection nor to guide or monitor treatment for MRSA infections. Test performance is not FDA approved in patients less than 21 years old. Performed at [facility], [address] [place], [place] [address]   CBC     Status: Abnormal   Collection Time: 02/11/22  3:38 AM  Result Value Ref Range   WBC 10.8 (H) 4.0 - 10.5 K/uL   RBC 2.41 (L) 3.87 - 5.11  MIL/uL   Hemoglobin 7.2 (L) 12.0 - 15.0 g/dL   HCT 24.0 (L) 36.0 - 46.0 %   MCV 99.6 80.0 - 100.0 fL   MCH 29.9 26.0 - 34.0 pg   MCHC 30.0 30.0 - 36.0 g/dL   RDW 13.6 11.5 - 15.5 %   Platelets 178 150 - 400 K/uL   nRBC 0.0 0.0 - 0.2 %    Comment: Performed at [facility], [address] [place], [place] [address]  Basic metabolic panel     Status: Abnormal   Collection Time: 02/11/22  3:38 AM  Result Value Ref Range   Sodium  137 135 - 145 mmol/L   Potassium 3.7 3.5 - 5.1 mmol/L   Chloride 106 98 - 111 mmol/L   CO2 25 22 - 32 mmol/L   Glucose, Bld 109 (H) 70 - 99 mg/dL    Comment: Glucose reference range applies only to samples taken after fasting for at least 8 hours.   BUN 25 (H) 8 - 23 mg/dL   Creatinine, Ser 1.11 (H) 0.44 - 1.00 mg/dL   Calcium 9.3 8.9 - 10.3 mg/dL   GFR, Estimated 50 (L) >60 mL/min    Comment: (NOTE) Calculated using the CKD-EPI Creatinine Equation (2021)    Anion gap 6 5 - 15    Comment: Performed at [facility], [address], [place], [place] [address]  CBC with Differential/Platelet     Status: Abnormal   Collection Time: 02/17/22 12:15 PM  Result Value Ref Range   WBC 7.9 3.4 - 10.8 x10E3/uL   RBC 2.51 (LL) 3.77 - 5.28 x10E6/uL   Hemoglobin 7.1 (LL) 11.1 - 15.9 g/dL    Comment:                   Client Requested Flag   Hematocrit 23.1 (L) 34.0 - 46.6 %   MCV 92 79 - 97 fL   MCH 28.3 26.6 - 33.0 pg   MCHC 30.7 (L) 31.5 - 35.7 g/dL   RDW 13.7 11.7 - 15.4 %   Platelets 398 150 - 450 x10E3/uL   Neutrophils 71 Not Estab. %   Lymphs 14 Not Estab. %   Monocytes 13 Not Estab. %   Eos 1 Not Estab. %   Basos 0 Not Estab. %   Neutrophils Absolute 5.7 1.4 - 7.0 x10E3/uL   Lymphocytes Absolute 1.1 0.7 - 3.1 x10E3/uL   Monocytes Absolute 1.0 (H) 0.1 - 0.9 x10E3/uL   EOS (ABSOLUTE) 0.1 0.0 - 0.4 x10E3/uL   Basophils Absolute 0.0 0.0 - 0.2 x10E3/uL   Immature Granulocytes 1 Not Estab. %   Immature Grans (Abs) 0.1 0.0 - 0.1 x10E3/uL  BMP8+EGFR      Status: Abnormal   Collection Time: 02/17/22 12:15 PM  Result Value Ref Range   Glucose 109 (H) 70 - 99 mg/dL   BUN 18 8 - 27 mg/dL   Creatinine, Ser 1.15 (H) 0.57 - 1.00 mg/dL   eGFR 48 (L) >59 mL/min/1.73   BUN/Creatinine Ratio 16 12 - 28   Sodium 139 134 - 144 mmol/L   Potassium 4.5 3.5 - 5.2 mmol/L   Chloride 104 96 - 106 mmol/L   CO2 22 20 - 29 mmol/L   Calcium 10.5 (H) 8.7 - 10.3 mg/dL  CBC with Differential/Platelet     Status: Abnormal   Collection Time: 02/18/22 11:11 AM  Result Value Ref Range   WBC 6.9 3.4 - 10.8 x10E3/uL   RBC 2.58 (LL) 3.77 - 5.28 x10E6/uL   Hemoglobin 7.3 (LL) 11.1 - 15.9 g/dL    Comment:                   Client Requested Flag   Hematocrit 23.3 (L) 34.0 - 46.6 %   MCV 90 79 - 97 fL   MCH 28.3 26.6 - 33.0 pg   MCHC 31.3 (L) 31.5 - 35.7 g/dL   RDW 13.7 11.7 - 15.4 %   Platelets 430 150 - 450 x10E3/uL   Neutrophils 77 Not Estab. %   Lymphs 12 Not Estab. %   Monocytes 9 Not Estab. %  Eos 1 Not Estab. %   Basos 0 Not Estab. %   Neutrophils Absolute 5.3 1.4 - 7.0 x10E3/uL   Lymphocytes Absolute 0.8 0.7 - 3.1 x10E3/uL   Monocytes Absolute 0.6 0.1 - 0.9 x10E3/uL   EOS (ABSOLUTE) 0.1 0.0 - 0.4 x10E3/uL   Basophils Absolute 0.0 0.0 - 0.2 x10E3/uL   Immature Granulocytes 1 Not Estab. %   Immature Grans (Abs) 0.1 0.0 - 0.1 x10E3/uL  CBC with Differential     Status: Abnormal   Collection Time: 02/24/22  8:53 AM  Result Value Ref Range   WBC 8.7 4.0 - 10.5 K/uL   RBC 2.42 (L) 3.87 - 5.11 MIL/uL   Hemoglobin 6.7 (LL) 12.0 - 15.0 g/dL    Comment: This critical result has verified and been called to PAGE C by Levan Hurst on 08 03 2023 at 0952, and has been read back.    HCT 22.5 (L) 36.0 - 46.0 %   MCV 93.0 80.0 - 100.0 fL   MCH 27.7 26.0 - 34.0 pg   MCHC 29.8 (L) 30.0 - 36.0 g/dL   RDW 15.6 (H) 11.5 - 15.5 %   Platelets 677 (H) 150 - 400 K/uL   nRBC 0.0 0.0 - 0.2 %   Neutrophils Relative % 69 %   Neutro Abs 6.1 1.7 - 7.7 K/uL   Lymphocytes  Relative 18 %   Lymphs Abs 1.6 0.7 - 4.0 K/uL   Monocytes Relative 9 %   Monocytes Absolute 0.8 0.1 - 1.0 K/uL   Eosinophils Relative 2 %   Eosinophils Absolute 0.2 0.0 - 0.5 K/uL   Basophils Relative 1 %   Basophils Absolute 0.1 0.0 - 0.1 K/uL   WBC Morphology MORPHOLOGY UNREMARKABLE    RBC Morphology MORPHOLOGY UNREMARKABLE    Smear Review MORPHOLOGY UNREMARKABLE    Immature Granulocytes 1 %   Abs Immature Granulocytes 0.08 (H) 0.00 - 0.07 K/uL    Comment: Performed at [facility], [address], [place], [place] [address]  Sample to Blood Bank(Blood Bank Hold)     Status: None   Collection Time: 02/24/22  8:53 AM  Result Value Ref Range   Blood Bank Specimen SAMPLE AVAILABLE FOR TESTING    Sample Expiration      02/27/2022,2359 Performed at [facility], [address] [place], [place] [address]   Reticulocytes     Status: Abnormal   Collection Time: 02/24/22  8:53 AM  Result Value Ref Range   Retic Ct Pct 3.9 (H) 0.4 - 3.1 %   RBC. 2.41 (L) 3.87 - 5.11 MIL/uL   Retic Count, Absolute 93.0 19.0 - 186.0 K/uL   Immature Retic Fract 35.2 (H) 2.3 - 15.9 %    Comment: Performed at [facility], [address], [place], [place] [address]    RADIOGRAPHIC STUDIES: I have personally reviewed the radiological images as listed and agreed with the findings in the report. DG Chest 2 View  Result Date: 02/18/2022 CLINICAL DATA:  Community-acquired pneumonia. Increased shortness of breath today. EXAM: CHEST - 2 VIEW COMPARISON:  AP chest 02/09/2022 and 02/10/2021; CT chest 04/03/2019 FINDINGS: Cardiac silhouette is again moderately enlarged. Mild calcification within aortic arch. The lungs are clear. No pleural effusion or pneumothorax. Moderate multilevel degenerative disc changes of the thoracic spine. IMPRESSION: No radiographic evidence of pneumonia. Electronically Signed   By: Yvonne Kendall M.D.   On: 02/18/2022 11:57   DG Chest Port 1 View  Result Date: 02/09/2022 CLINICAL DATA:   Sepsis  EXAM: PORTABLE CHEST 1 VIEW COMPARISON:  02/10/2021 FINDINGS: Single frontal view of the chest demonstrates an unremarkable cardiac silhouette. No airspace disease, effusion, or pneumothorax. No acute bony abnormalities. IMPRESSION: 1. No acute intrathoracic process. Electronically Signed   By: Randa Ngo M.D.   On: 02/09/2022 19:22    ASSESSMENT:  Normocytic anemia from blood loss: - EGD (05/18/2021): Gastric antral vascular ectasia, treated with radiofrequency ablation.  Normal duodenum, jejunum. - Colonoscopy (12/08/2019): Diverticulosis in the sigmoid colon, descending colon and transverse colon.  4 to 6 mm polyps in the sigmoid colon, descending colon and hepatic flexure. - Patient required transfusions in 2021 and 2022.  She does report ice pica. - She is on iron tablet twice daily since July 2023.  Prior to that she took it for many years until January 2023.   Social/family history: - She lives at home with her older son.  She takes care of the garden and does laundry and her ADLs and IADLs.  But lately her energy levels are low and she is not able to do them.  She was a retired tobacco former.  Never smoked. - Sister had anemia.  Mother had uterine cancer and sister had colon cancer.  3.  Unprovoked right leg DVT and PE: - Doppler on 04/03/2019: DVT in the femoral vein and calf veins. - CT chest PE protocol (04/03/2019): Moderate size pulmonary emboli in the right lower lobe. - She was treated with Eliquis full dose, currently dose reduced to 2.5 mg twice daily.   PLAN:  Normocytic anemia from blood loss: - She reports dark black stools and feels very weak.  She is also on Eliquis which could be contributing to the bleeding.  She also has CKD which could be contributing to normocytic anemia. - We will repeat her CBC today and transfuse if hemoglobin less than 7. - We will evaluate for nutritional deficiencies and hemolysis.  We will also check SPEP. - We talked about initiating  parenteral iron therapy.  We discussed side effects including allergic reactions.   All questions were answered. The patient knows to call the clinic with any problems, questions or concerns.  Derek Jack, MD 02/24/22 10:00 AM  Register [phone]   I, Thana Ates, am acting as a scribe for Dr. Derek Jack.  I, Derek Jack MD, have reviewed the above documentation for accuracy and completeness, and I agree with the above.

## 2022-02-24 NOTE — Progress Notes (Signed)
Pt presents today for 1 unit of blood per provider's order. Pt's hemoglobin noted to be 6.7 today.   Peripheral IV started with good blood return pre and post infusion.  1 unit of blood given today per MD orders. Tolerated infusion without adverse affects. Vital signs stable. No complaints at this time. Discharged from clinic ambulatory in stable condition. Alert and oriented x 3. F/U with [facility] as scheduled.

## 2022-02-24 NOTE — Progress Notes (Signed)
CRITICAL VALUE ALERT Critical value received:  hgb 6.7 Date of notification:  02-24-22 Time of notification: 0955 Critical value read back:  Yes.   Nurse who received alert:  C. Mattison Stuckey RN MD notified time and response:  [phone], Dr. Raliegh Ip , will give one unit of blood today per MD.

## 2022-02-24 NOTE — Patient Instructions (Signed)
[facility]  Discharge Instructions: Thank you for choosing [facility] to provide your oncology and hematology care.  If you have a lab appointment with the [facility], please come in thru the Main Entrance and check in at the main information desk.  Wear comfortable clothing and clothing appropriate for easy access to any Portacath or PICC line.   We strive to give you quality time with your provider. You may need to reschedule your appointment if you arrive late (15 or more minutes).  Arriving late affects you and other patients whose appointments are after yours.  Also, if you miss three or more appointments without notifying the office, you may be dismissed from the clinic at the provider's discretion.      For prescription refill requests, have your pharmacy contact our office and allow 72 hours for refills to be completed.    Today you received  1 unit of blood      BELOW ARE SYMPTOMS THAT SHOULD BE REPORTED IMMEDIATELY: *FEVER GREATER THAN 100.4 F (38 C) OR HIGHER *CHILLS OR SWEATING *NAUSEA AND VOMITING THAT IS NOT CONTROLLED WITH YOUR NAUSEA MEDICATION *UNUSUAL SHORTNESS OF BREATH *UNUSUAL BRUISING OR BLEEDING *URINARY PROBLEMS (pain or burning when urinating, or frequent urination) *BOWEL PROBLEMS (unusual diarrhea, constipation, pain near the anus) TENDERNESS IN MOUTH AND THROAT WITH OR WITHOUT PRESENCE OF ULCERS (sore throat, sores in mouth, or a toothache) UNUSUAL RASH, SWELLING OR PAIN  UNUSUAL VAGINAL DISCHARGE OR ITCHING   Items with * indicate a potential emergency and should be followed up as soon as possible or go to the Emergency Department if any problems should occur.  Please show the CHEMOTHERAPY ALERT CARD or IMMUNOTHERAPY ALERT CARD at check-in to the Emergency Department and triage nurse.  Should you have questions after your visit or need to cancel or reschedule your appointment, please contact [facility] [phone]  and follow the prompts.  Office hours are 8:00 a.m. to 4:30 p.m. Monday - Friday. Please note that voicemails left after 4:00 p.m. may not be returned until the following business day.  We are closed weekends and major holidays. You have access to a nurse at all times for urgent questions. Please call the main number to the clinic [phone] and follow the prompts.  For any non-urgent questions, you may also contact your provider using MyChart. We now offer e-Visits for anyone 67 and older to request care online for non-urgent symptoms. For details visit [email or web address].   Also download the MyChart app! Go to the app store, search "MyChart", open the app, select [facility], and log in with your MyChart username and password.  Masks are optional in the cancer centers. If you would like for your care team to wear a mask while they are taking care of you, please let them know. For doctor visits, patients may have with them one support person who is at least 80 years old. At this time, visitors are not allowed in the infusion area.

## 2022-02-25 ENCOUNTER — Ambulatory Visit: Payer: PPO | Admitting: *Deleted

## 2022-02-25 DIAGNOSIS — I10 Essential (primary) hypertension: Secondary | ICD-10-CM

## 2022-02-25 DIAGNOSIS — N1831 Chronic kidney disease, stage 3a: Secondary | ICD-10-CM

## 2022-02-25 DIAGNOSIS — R7303 Prediabetes: Secondary | ICD-10-CM

## 2022-02-25 DIAGNOSIS — I482 Chronic atrial fibrillation, unspecified: Secondary | ICD-10-CM

## 2022-02-25 DIAGNOSIS — E785 Hyperlipidemia, unspecified: Secondary | ICD-10-CM

## 2022-02-25 LAB — TYPE AND SCREEN
ABO/RH(D): O POS
Antibody Screen: NEGATIVE
Unit division: 0

## 2022-02-25 LAB — HAPTOGLOBIN: Haptoglobin: 377 mg/dL — ABNORMAL HIGH (ref 42–346)

## 2022-02-25 LAB — BPAM RBC
Blood Product Expiration Date: 202309022359
ISSUE DATE / TIME: 202308031208
Unit Type and Rh: 5100

## 2022-02-25 NOTE — Patient Instructions (Signed)
Visit Information  Thank you for taking time to visit with me today. Please don't hesitate to contact me if I can be of assistance to you before your next scheduled telephone appointment.  Following are the goals we discussed today:  Patient will self administer medications as prescribed Patient will attend all scheduled provider appointments Patient will continue to perform ADL's independently Patient will call provider office for new concerns or questions Patient will take medication as directed. Call PCP or seek appropriate medical attention as needed for new or worsening symptoms Patient will seek appropriate medical attention for any s/s of bleeding or anemia Patient will monitor heart rate and call cardiologist with any readings outside of recommended range Patient will check and record blood pressure daily and call PCP or cardio with any readings outside of recommended range Patient will check and record blood sugar daily and call PCP with any readings outside of recommended range Patient will talk with CCM team about prescription assistance needs Patient will follow-up with Jackelyn Poling, RN Care Coordinator   If you are experiencing a Mental Health or [place] or need someone to talk to, please call the [facility]: [phone] call [phone]   Patient verbalizes understanding of instructions and care plan provided today and agrees to view in [facility]. Active MyChart status and patient understanding of how to access instructions and care plan via MyChart confirmed with patient.      Plan: Telephone follow up appointment with care management team member scheduled for:  03/15/22 with Jackelyn Poling, RN Care Coordinator The patient has been provided with contact information for the care management team and has been advised to call with any health related questions or concerns.   Please call the care guide team at [phone] if you need to cancel or  reschedule your appointment.   Chong Sicilian, BSN, RN-BC Proofreader Dial: [phone]

## 2022-02-25 NOTE — Chronic Care Management (AMB) (Signed)
Care Management    [name] [name] Visit Note  02/25/2022 Name: [name] [name] MRN: [id number] DOB: 10-26-1940  Subjective: [name] [name] is a 81 y.o. year old female who is a primary care patient of [name], FNP.   Engaged with patient by telephone for follow up visit in response to provider referral for case management and/or care coordination services.   Consent to Services:   [name] [name] was given information about Care Management services today including:  Care Management services includes personalized support from designated clinical staff supervised by her physician, including individualized plan of care and coordination with other care providers 24/7 contact phone numbers for assistance for urgent and routine care needs. The patient may stop case management services at any time by phone call to the office staff.  Patient agreed to services and consent obtained.   Due to changes in the Chronic Care Management program, I am removing myself as the [place] from the Care Team and closing [name] [name] Care Management Care Plans. Patient was scheduled to be followed by the [name] [name] Care Coordination nurse for [facility].   Patient has an open Care Plan with another CCM team member, Lottie Dawson, PharmD.  I will forward this case closure encounter to the other team member(s). Patient does not have a current CCM referral placed since 11/22/21. CCM enrollment status changed to "not enrolled". Patient a current referral for Care Coordination service on 02/04/22 for PharmD assistance. Will reach out to PCP for new referral.    Assessment: Review of patient past medical history, allergies, medications, health status, including review of consultants reports, laboratory and other test data, was performed as part of comprehensive evaluation and provision of chronic care management services.   SDOH (Social Determinants of Health) assessments and interventions performed:    Care Plan  No Known  Allergies  Outpatient Encounter Medications as of 02/25/2022  Medication Sig   acetaminophen (TYLENOL) 325 MG tablet Take 2 tablets (650 mg total) by mouth every 6 (six) hours as needed for mild pain or headache (or Fever >/= 101).   albuterol (VENTOLIN HFA) 108 (90 Base) MCG/ACT inhaler Inhale 2 puffs into the lungs every 6 (six) hours as needed for wheezing or shortness of breath.   allopurinol (ZYLOPRIM) 100 MG tablet Take 1 tablet (100 mg total) by mouth daily.   amLODipine (NORVASC) 2.5 MG tablet Take 1 tablet (2.5 mg total) by mouth daily.   apixaban (ELIQUIS) 2.5 MG TABS tablet Take 1 tablet (2.5 mg total) by mouth 2 (two) times daily.   atorvastatin (LIPITOR) 20 MG tablet Take 1 tablet (20 mg total) by mouth daily.   dapagliflozin propanediol (FARXIGA) 10 MG TABS tablet Take 1 tablet (10 mg total) by mouth daily.   dextromethorphan-guaiFENesin (MUCINEX DM) 30-600 MG 12hr tablet Take 1 tablet by mouth 2 (two) times daily for 20 days.   docusate sodium (COLACE) 100 MG capsule Take 1 capsule (100 mg total) by mouth 2 (two) times daily.   furosemide (LASIX) 20 MG tablet Take 1 tablet (20 mg total) by mouth daily.   GNP CRANBERRY EXTRACT PO Take 500 mg by mouth in the morning.   levothyroxine (SYNTHROID) 50 MCG tablet TAKE 1 TABLET BY MOUTH ONCE DAILY BEFORE BREAKFAST   loratadine (CLARITIN) 10 MG tablet Take 10 mg by mouth daily.   oxybutynin (DITROPAN-XL) 10 MG 24 hr tablet Take 1 tablet (10 mg total) by mouth at bedtime.   pantoprazole (PROTONIX) 40 MG tablet Take 1  tablet (40 mg total) by mouth 2 (two) times daily before a meal.   polyethylene glycol (MIRALAX / GLYCOLAX) 17 g packet Take 17 g by mouth daily as needed for mild constipation.   vitamin B-12 (CYANOCOBALAMIN) 50 MCG tablet Take 50 mcg by mouth daily.   Vitamin D3 (VITAMIN D) 25 MCG tablet Take 1,000 Units by mouth daily.   No facility-administered encounter medications on file as of 02/25/2022.    Patient Active Problem List    Diagnosis Date Noted   Severe sepsis ([place]) 02/09/2022   Hypokalemia 02/09/2022   Acute respiratory failure with hypoxia ([place]) 02/09/2022   Hypoalbuminemia 02/09/2022   CAP (community acquired pneumonia) 02/09/2022   Primary hyperparathyroidism ([place]) 10/30/2021   Prediabetes 10/30/2021   MR (congenital mitral regurgitation) 08/01/2021   Stage 3a chronic kidney disease ([place]) 07/30/2021   Nonrheumatic tricuspid valve regurgitation 02/24/2021   Nonrheumatic mitral valve regurgitation    Chronic a-fib ([place]) 02/04/2021   Iron deficiency anemia due to chronic blood loss    Sciatica of right side 10/18/2018   Seborrheic keratosis 10/17/2016   Dyslipidemia 10/17/2016   Overactive bladder 10/17/2016   Gastroesophageal reflux disease without esophagitis 10/17/2016   Primary osteoarthritis involving multiple joints 10/17/2016   Controlled gout 10/17/2016   Essential hypertension 05/16/2016   Acquired hypothyroidism 05/16/2016   Morbid obesity ([place]) 10/30/2013   S/P right THA, AA 10/29/2013    Conditions to be addressed/monitored:  Anemia  Care Plan : [facility] Care Plan  Updates made by [name] China, [name] [name] since 02/25/2022 12:00 AM  Completed 02/25/2022   Problem: Chronic Disease Management Needs (DM, HTN, HLD, Afib, chronic anticoagulation) Resolved 02/25/2022  Priority: High     Long-Range Goal: Work with [place] Regarding Chronic Medical Conditions Completed 02/25/2022  Start Date: 02/02/2021  This Visit's Progress: On track  Recent Progress: On track  Priority: High  Note:   Closed RNCM care plan. Transitioned patient to Care Coordination. Appt scheduled.   Current Barriers:  Chronic Disease Management support and education needs related to Atrial Fibrillation, HTN, HLD, and DMII  RNCM Clinical Goal(s):  Patient will continue to work with [name] [name] Care Manager and/or Social Worker to address care management and care coordination needs related to Atrial  Fibrillation, HTN, HLD, and DMII as evidenced by adherence to CM Team Scheduled appointments     through collaboration with [name] [name] Care manager, provider, and care team.   Interventions: 1:1 collaboration with Loman Brooklyn, FNP regarding development and update of comprehensive plan of care as evidenced by provider attestation and co-signature  Inter-disciplinary care team collaboration (see longitudinal plan of care) Reviewed chart including relevant office notes, lab results, and consultation reports Provided with [name] [name] Care Manager contact number and encouraged to reach out as needed   Diabetes:  (Status: Goal on Track (progressing): YES.) Lab Results  Component Value Date   HGBA1C 5.1 05/02/2021   HGBA1C 5.7 (H) 12/25/2020   HGBA1C 5.7 10/20/2020   Lab Results  Component Value Date   [place] 97 07/30/2021   CREATININE 1.19 (H) 07/30/2021  Assessed patient's understanding of A1c goal: <7% Reviewed and discussed most recent A1C Reviewed medications with patient and discussed importance of medication adherence; Counseled on importance of regular laboratory monitoring as prescribed; Discussed plans with patient for ongoing care management follow up and provided patient with direct contact information for care management team; Reviewed scheduled/upcoming provider appointments including:  ; Encouraged to continue to follow an ADA/carb modified diet  Discussed addition of [place]. Discussed cost. Affordable now at $90 for a 90 day supply from mail order. Understands that she will go into the doughnut hole quicker because of this and Eliquis. Can apply for assistance when needed.    Hypertension: (Status: Goal on Track (progressing): YES.) Last practice recorded BP readings:  BP Readings from Last 3 Encounters:  08/03/21 130/60  07/30/21 (!) 171/86  07/06/21 (!) 176/52   Evaluation of current treatment plan related to hypertension self management and patient's adherence to plan as  established by provider;   Reviewed medications with patient and discussed importance of compliance;  Discussed plans with patient for ongoing care management follow up and provided patient with direct contact information for care management team; Advised patient, providing education and rationale, to monitor blood pressure daily and record, calling PCP for findings outside established parameters;  Reviewed and discussed recent cardiology and PCP visits Discussed increase in amlodipine from 57m to 179mdaily. Patient is compliant and blood pressure has improved. Discussed home blood pressure averaging 130/80 Discussed heart rate since stopping beta blocker. Ranges in the 4091snd 50s. Feels well. No dizziness or fatigue.    HDL:  (Status: Goal on Track (progressing): YES.) Lab Results  Component Value Date   CHOL 179 07/30/2021   HDL 58 07/30/2021   LDLCALC 97 07/30/2021   TRIG 137 07/30/2021   CHOLHDL 3.1 07/30/2021  Evaluation of current treatment plan related to HLD self-management and patient's adherence to plan as established by provider. Discussed plans with patient for ongoing care management follow up and provided patient with direct contact information for care management team Reviewed and discussed recent lab results Reviewed and discussed medications and compliance Encouraged patient to continue increasing physical activity level as tolerated   A-fib:  (Status: Goal on track: YES.) Afib action plan reviewed Assessed social determinant of health barriers Discussed prescription assistance approval for Eliquis. Approved until 07/24/21. Reviewed eligibility requirements for 2023. Patient has enough samples and current prescription to last for a few months.  Advised patient to continue to monitor heart rate and symptoms and to reach out to cardio or seek emergency medical attention if needed Discussed heart rate monitoring. Still ranging in the 40s and 50s. Feels well. No dizziness  or fatigue.  Encouraged to reach [facility] to [facility]s needed   Elevated Calcium & PTH:  (Status: Goal on Track (progressing): YES.) Long Term Goal  Reviewed and discussed recent lab results and office notes Discussed visit with endocrinology Reviewed and discussed most recent lab results Discussed treatment plan for elev calcium and abnormal PTH   Anemia/Bleeding:  (Status: Goal on track (progressing): YES)  Lab Results  Component Value Date   WBC 8.7 02/24/2022   HGB 6.7 (LL) 02/24/2022   HCT 22.5 (L) 02/24/2022   MCV 93.0 02/24/2022   PLT 677 (H) 02/24/2022   Reviewed and discussed recent lab results and provider recommendations Reviewed and discussed recent hospitalization for sepsis. Patient feels much better. Reviewed and discussed most recent hematology office visit and lab results.  Discussed blood transfusion due to Hgb of 6.7 Discussed last colonoscopy/endoscopy in 2021. Varices in stomach were cauterized.  Discussed no frank blood or coffee ground emesis but patient does report black, tarry stools. Hematology is aware.  Discussed potential need for repeat endoscopy.  Reviewed upcoming appointment with hematology Discussed symptoms of anemia. Patient reports that she is familiar with these. Prior to transfusion she felt weak and short of breath. Reports feeling much better  now.  Advised to call hematologist with any new or worsening symptoms and to seek emergency medical attention if needed   Acute Eyelid Problem:  (Status: Goal Met.) Talked with patient by telephone regarding bilateral eyelid irritation, itching, and swelling. First noticed it 2 days ago. Has not used any different medications, eye drops, facial products, etc. Has used systane eye drops as usual. Advised that some eye drops have preservative or added ingredients that she may be allergic to.  Does not have any eyeball symptoms. Eyelid symptoms are improving at this point Discussed possibly of blepharitis.  Denies any dryness or flaking.  Encouraged to continue daily Claritin. Can take a benadryl at night if needed. Advised that it may make her drowsy. Clean with gentle cleanser and pat dry. Can put a thin layer of Vaseline on eyelid to see if that will help with irritation Advised to see a provider if symptoms get worse or don't improve  Patient Goals/Self-Care Activities: Patient will self administer medications as prescribed Patient will attend all scheduled provider appointments Patient will continue to perform ADL's independently Patient will call provider office for new concerns or questions Patient will take medication as directed. Call PCP or seek appropriate medical attention as needed for new or worsening symptoms Patient will seek appropriate medical attention for any s/s of bleeding or anemia Patient will monitor heart rate and call cardiologist with any readings outside of recommended range Patient will check and record blood pressure daily and call PCP or cardio with any readings outside of recommended range Patient will check and record blood sugar daily and call PCP with any readings outside of recommended range Patient will talk with CCM team about prescription assistance needs Patient will follow-up with Jackelyn Poling, [name] [name] Care Coordinator  Plan: Telephone follow up appointment with care management team member scheduled for:  03/15/22 with Jackelyn Poling, [name] [name] Care Coordinator The patient has been provided with contact information for the care management team and has been advised to call with any health related questions or concerns.   Chong Sicilian, BSN, [name] [name]-BC Proofreader Dial: [phone]

## 2022-02-26 LAB — COPPER, SERUM: Copper: 171 ug/dL — ABNORMAL HIGH (ref 80–158)

## 2022-02-28 LAB — METHYLMALONIC ACID, SERUM: Methylmalonic Acid, Quantitative: 203 nmol/L (ref 0–378)

## 2022-02-28 LAB — PROTEIN ELECTROPHORESIS, SERUM
A/G Ratio: 0.9 (ref 0.7–1.7)
Albumin ELP: 2.8 g/dL — ABNORMAL LOW (ref 2.9–4.4)
Alpha-1-Globulin: 0.4 g/dL (ref 0.0–0.4)
Alpha-2-Globulin: 1.1 g/dL — ABNORMAL HIGH (ref 0.4–1.0)
Beta Globulin: 0.9 g/dL (ref 0.7–1.3)
Gamma Globulin: 0.6 g/dL (ref 0.4–1.8)
Globulin, Total: 3 g/dL (ref 2.2–3.9)
Total Protein ELP: 5.8 g/dL — ABNORMAL LOW (ref 6.0–8.5)

## 2022-03-01 ENCOUNTER — Encounter: Payer: PPO | Admitting: Hematology

## 2022-03-02 ENCOUNTER — Telehealth: Payer: Self-pay | Admitting: *Deleted

## 2022-03-02 ENCOUNTER — Other Ambulatory Visit: Payer: Self-pay | Admitting: *Deleted

## 2022-03-02 ENCOUNTER — Inpatient Hospital Stay: Payer: PPO

## 2022-03-02 DIAGNOSIS — D500 Iron deficiency anemia secondary to blood loss (chronic): Secondary | ICD-10-CM

## 2022-03-02 LAB — CBC WITH DIFFERENTIAL/PLATELET
Abs Immature Granulocytes: 0.06 10*3/uL (ref 0.00–0.07)
Basophils Absolute: 0 10*3/uL (ref 0.0–0.1)
Basophils Relative: 0 %
Eosinophils Absolute: 0.1 10*3/uL (ref 0.0–0.5)
Eosinophils Relative: 1 %
HCT: 22.4 % — ABNORMAL LOW (ref 36.0–46.0)
Hemoglobin: 6.6 g/dL — CL (ref 12.0–15.0)
Immature Granulocytes: 1 %
Lymphocytes Relative: 20 %
Lymphs Abs: 1.6 10*3/uL (ref 0.7–4.0)
MCH: 27.5 pg (ref 26.0–34.0)
MCHC: 29.5 g/dL — ABNORMAL LOW (ref 30.0–36.0)
MCV: 93.3 fL (ref 80.0–100.0)
Monocytes Absolute: 0.8 10*3/uL (ref 0.1–1.0)
Monocytes Relative: 10 %
Neutro Abs: 5.3 10*3/uL (ref 1.7–7.7)
Neutrophils Relative %: 68 %
Platelets: 476 10*3/uL — ABNORMAL HIGH (ref 150–400)
RBC: 2.4 MIL/uL — ABNORMAL LOW (ref 3.87–5.11)
RDW: 16.6 % — ABNORMAL HIGH (ref 11.5–15.5)
WBC: 7.7 10*3/uL (ref 4.0–10.5)
nRBC: 0 % (ref 0.0–0.2)

## 2022-03-02 LAB — PREPARE RBC (CROSSMATCH)

## 2022-03-02 LAB — SAMPLE TO BLOOD BANK

## 2022-03-02 NOTE — Telephone Encounter (Signed)
Patient called to advise that she is feeling weak like she did 8/3 when hgb low.  She received blood that day.  Appointment made to recheck her CBC and BB sample today.

## 2022-03-02 NOTE — Addendum Note (Signed)
Addended by: [name] on: 03/02/2022 12:25 PM   Modules accepted: Orders

## 2022-03-02 NOTE — Telephone Encounter (Signed)
Notified patient of HGB 6.6.  Will be here tomorrow @ 915 for transfusion.  Reminded patient to keep blue lab band on.  Verbalized understanding.

## 2022-03-02 NOTE — Progress Notes (Unsigned)
CRITICAL VALUE ALERT Critical value received:  HGB 6.6.  Date of notification:  03-02-2022 Time of notification: 14:27pm.  Critical value read back:  Yes.   Nurse who received alert:  B.Mico Spark Rn  MD notified time and response:  Katragadda '@14'$ :40 pm. Patient to receive 1 unit of blood tomorrow.

## 2022-03-03 ENCOUNTER — Inpatient Hospital Stay: Payer: PPO

## 2022-03-03 VITALS — BP 150/56 | HR 55 | Temp 97.6°F | Resp 18

## 2022-03-03 DIAGNOSIS — D500 Iron deficiency anemia secondary to blood loss (chronic): Secondary | ICD-10-CM | POA: Diagnosis not present

## 2022-03-03 MED ORDER — SODIUM CHLORIDE 0.9 % IV SOLN
510.0000 mg | Freq: Once | INTRAVENOUS | Status: AC
  Administered 2022-03-03: 510 mg via INTRAVENOUS
  Filled 2022-03-03: qty 17

## 2022-03-03 MED ORDER — LORATADINE 10 MG PO TABS: 10.0000 mg | ORAL_TABLET | Freq: Once | ORAL | Status: DC

## 2022-03-03 MED ORDER — SODIUM CHLORIDE 0.9 % IV SOLN: Freq: Once | INTRAVENOUS | Status: AC

## 2022-03-03 MED ORDER — SODIUM CHLORIDE 0.9% IV SOLUTION
250.0000 mL | Freq: Once | INTRAVENOUS | Status: AC
  Administered 2022-03-03: 250 mL via INTRAVENOUS

## 2022-03-03 MED ORDER — DIPHENHYDRAMINE HCL 25 MG PO CAPS
25.0000 mg | ORAL_CAPSULE | Freq: Once | ORAL | Status: AC
  Administered 2022-03-03: 25 mg via ORAL

## 2022-03-03 MED ORDER — ACETAMINOPHEN 325 MG PO TABS
650.0000 mg | ORAL_TABLET | Freq: Once | ORAL | Status: AC
  Administered 2022-03-03: 650 mg via ORAL

## 2022-03-03 NOTE — Patient Instructions (Signed)
[facility]  Discharge Instructions: Thank you for choosing [facility] to provide your oncology and hematology care.  If you have a lab appointment with the [facility], please come in thru the Main Entrance and check in at the main information desk.  Wear comfortable clothing and clothing appropriate for easy access to any Portacath or PICC line.   We strive to give you quality time with your provider. You may need to reschedule your appointment if you arrive late (15 or more minutes).  Arriving late affects you and other patients whose appointments are after yours.  Also, if you miss three or more appointments without notifying the office, you may be dismissed from the clinic at the provider's discretion.      For prescription refill requests, have your pharmacy contact our office and allow 72 hours for refills to be completed.    Today you received the following chemotherapy and/or immunotherapy agents 1uprbc/feraheme      To help prevent nausea and vomiting after your treatment, we encourage you to take your nausea medication as directed.  BELOW ARE SYMPTOMS THAT SHOULD BE REPORTED IMMEDIATELY: *FEVER GREATER THAN 100.4 F (38 C) OR HIGHER *CHILLS OR SWEATING *NAUSEA AND VOMITING THAT IS NOT CONTROLLED WITH YOUR NAUSEA MEDICATION *UNUSUAL SHORTNESS OF BREATH *UNUSUAL BRUISING OR BLEEDING *URINARY PROBLEMS (pain or burning when urinating, or frequent urination) *BOWEL PROBLEMS (unusual diarrhea, constipation, pain near the anus) TENDERNESS IN MOUTH AND THROAT WITH OR WITHOUT PRESENCE OF ULCERS (sore throat, sores in mouth, or a toothache) UNUSUAL RASH, SWELLING OR PAIN  UNUSUAL VAGINAL DISCHARGE OR ITCHING   Items with * indicate a potential emergency and should be followed up as soon as possible or go to the Emergency Department if any problems should occur.  Please show the CHEMOTHERAPY ALERT CARD or IMMUNOTHERAPY ALERT CARD at check-in to the  Emergency Department and triage nurse.  Should you have questions after your visit or need to cancel or reschedule your appointment, please contact [facility] [phone]  and follow the prompts.  Office hours are 8:00 a.m. to 4:30 p.m. Monday - Friday. Please note that voicemails left after 4:00 p.m. may not be returned until the following business day.  We are closed weekends and major holidays. You have access to a nurse at all times for urgent questions. Please call the main number to the clinic [phone] and follow the prompts.  For any non-urgent questions, you may also contact your provider using MyChart. We now offer e-Visits for anyone 33 and older to request care online for non-urgent symptoms. For details visit [email or web address].   Also download the MyChart app! Go to the app store, search "MyChart", open the app, select [facility], and log in with your MyChart username and password.  Masks are optional in the cancer centers. If you would like for your care team to wear a mask while they are taking care of you, please let them know. For doctor visits, patients may have with them one support person who is at least 80 years old. At this time, visitors are not allowed in the infusion area.

## 2022-03-03 NOTE — Progress Notes (Signed)
Patient presents today for Feraheme and 1UPRBC per providers order.  Vital signs WNL.  Patients only complaint is of fatigue.  Peripheral IV started and blood return noted pre and post infusion.  Tolerated infusion without adverse affects.  Vital signs stable.  No complaints at this time.  Discharge from clinic ambulatory in stable condition.  Alert and oriented X 3.  Follow up with [facility] as scheduled.

## 2022-03-04 ENCOUNTER — Ambulatory Visit: Payer: Self-pay | Admitting: *Deleted

## 2022-03-04 ENCOUNTER — Encounter: Payer: Self-pay | Admitting: *Deleted

## 2022-03-04 LAB — TYPE AND SCREEN
ABO/RH(D): O POS
Antibody Screen: NEGATIVE
Unit division: 0

## 2022-03-04 LAB — BPAM RBC
Blood Product Expiration Date: 202309092359
ISSUE DATE / TIME: 202308101027
Unit Type and Rh: 5100

## 2022-03-04 NOTE — Patient Outreach (Signed)
  Care Coordination   Initial Visit Note   03/04/2022 Name: [name] [name] MRN: [id number] DOB: [birth year]  [name] [name] is a 81 y.o. year old female who sees [name] Perking, FNP for primary care. I spoke with  [name] [name] by phone today  What matters to the patients health and wellness today?  Decrease hemoglobin, related to iron deficiency anemia resulting in need of recent iron infusions, PMH per patient include varices bleeds  appetite not good   Goals Addressed               This Visit's Progress     Patient Stated     manage iron deficiency anemia [facility]) (pt-stated)   Not on track     Start date 03/04/22  Care Coordination Interventions: Counseled on bleeding risk associated with iron deficiency anemia and importance of self-monitoring for signs/symptoms of bleeding Advised to call provider or 911 if active bleeding or signs and symptoms of active bleeding occur encouraged optimal oral intake to support fluid balance and nutrition Screening for signs and symptoms of depression related to chronic disease state Assessed social determinant of health barriers  encouraged dietary changes to increase dietary intake of iron, vitamin C, constipation management  online education provided via my chart for iron rich diet after discussed better choices of iron rich foods in detail, preventing iron deficiency anemia, atrial fibrillation (article & video) esophageal varices Assessed knowledge and home symptoms related to atrial fibrillation, Reviewed symptoms to monitor (sweating, back)         SDOH assessments and interventions completed:  Yes     Care Coordination Interventions Activated:  Yes  Care Coordination Interventions:  Yes, provided   Follow up plan: Follow up call scheduled for 03/11/22 & 04/04/22    Encounter Outcome:  Pt. Visit Completed   [name] [name] L. Lavina Hamman, RN, BSN, [place] Coordinator Office number [phone]

## 2022-03-04 NOTE — Patient Instructions (Addendum)
Visit Information  Thank you for taking time to visit with me today. Please don't hesitate to contact me if I can be of assistance to you.   Following are the goals we discussed today:   Goals Addressed               This Visit's Progress     Patient Stated     manage iron deficiency anemia [facility]) (pt-stated)   Not on track     Start date 03/04/22  Care Coordination Interventions: Counseled on bleeding risk associated with iron deficiency anemia and importance of self-monitoring for signs/symptoms of bleeding Advised to call provider or 911 if active bleeding or signs and symptoms of active bleeding occur encouraged optimal oral intake to support fluid balance and nutrition Screening for signs and symptoms of depression related to chronic disease state Assessed social determinant of health barriers  encouraged dietary changes to increase dietary intake of iron, vitamin C, constipation management  online education provided via my chart for iron rich diet after discussed better choices of iron rich foods in detail, preventing iron deficiency anemia, atrial fibrillation (article & video) esophageal varices Assessed knowledge and home symptoms related to atrial fibrillation, Reviewed symptoms to monitor (sweating, back)         Our next appointment is by telephone on 03/11/22  at 1030  Please call the care guide team at [phone] if you need to cancel or reschedule your appointment.   If you are experiencing a Mental Health or [place] or need someone to talk to, please call the Suicide and Crisis Lifeline: 988 call the [place] National Suicide Prevention Lifeline: 623-746-2347 or TTY: 917-342-4396 TTY (650) 397-5771) to talk to a trained counselor call 1-800-273-TALK (toll free, 24 hour hotline) go to [facility] [address], [place] [phone]) call the [place]: [phone] call 911   Patient  verbalizes understanding of instructions and care plan provided today and agrees to view in [place]. Active MyChart status and patient understanding of how to access instructions and care plan via MyChart confirmed with patient.     The patient has been provided with contact information for the care management team and has been advised to call with any health related questions or concerns.   [place] Lavina Hamman, RN, BSN, [place] Coordinator Office number [phone]

## 2022-03-05 NOTE — Progress Notes (Unsigned)
Referring Provider: [name], FNP Primary Care Physician:  [name], FNP Primary GI Physician: Dr. Gala Romney  No chief complaint on file.   HPI:   [name] [name] is a 81 y.o. female with history of IDA, chronically anticoagulated on Eliquis due to history of PE/DVT, presenting today for acute visit at the request of Dr. Delton Coombes due to iron deficiency anemia requiting blood transfusion.    Extensive GI evaluation in 2021 for IDA including colonoscopy with tubular adenoma, diverticulosis, colonic lipoma.  EGD with normal esophagus, possible early GAVE, normal duodenum, gastric biopsy negative for H. pylori.  Capsule study with scattered small bowel erosions, abnormal small bowel with edema, inflammation/erosion.  CTA with SMA noting 50% stenosis, IMA and celiac patent.   Hospitalized at [facility] in October 2022 with symptomatic anemia. Hemoglobin down to 7. Appears she had capsule endoscopy which completed, but report not available in epic.   EGD 05/18/2021 (outpatient) with Dr. Watt Climes: Tiny hiatal hernia, gastric antral vascular ectasia s/p ablation, normal duodenum, normal jejunum.  Hemoglobin normalized in November 2022 and was 13.4 in January 2023.  Hemoglobin down to 10.1 in April 2023, 8.1 02/09/2022 on day of admission for acute hypoxemic respiratory failure in the setting of pneumonia with sepsis.  Stabilized at 7.2.   She established with hematology on 02/24/2022.  Due to hemoglobin of 6.7, she received 1 unit PRBCs on 8/3.  She was also found to have iron 26, saturation 7% ferritin 11, vitamin B12 914, folate19.7 on 8/3. Repeat hemoglobin on 8/9 was down to 6.6, she received 1 unit PRBCs and Feraheme on 8/10.    Today:    Oral iron:   Fhx colon cancer:   Past Medical History:  Diagnosis Date   Anemia    Arthritis    Ostearthritis- hips, knees, fingers   Basal cell carcinoma (BCC) of right hand 10/2021   Dx by [facility]   DVT (deep venous  thrombosis) (HCC)    Dyspnea    GERD (gastroesophageal reflux disease)    Headache(784.0)    tx. Valproic acid   History of diabetes mellitus    History of kidney stones    Hypertension    Hypothyroidism    Pulmonary embolism [facility])     Past Surgical History:  Procedure Laterality Date   ABDOMINAL HYSTERECTOMY     BILATERAL HIP ARTHROSCOPY Left    BIOPSY  12/08/2019   Procedure: BIOPSY;  Surgeon: Daneil Dolin, MD;  Location: [facility];  Service: Endoscopy;;   CATARACT EXTRACTION, BILATERAL     COLONOSCOPY N/A 12/08/2019   polyps (tubular adenoma), diverticulosis, colonic lipoma, no surveillance due to age   58 W/ [place] Right 12/25/2020   Procedure: CYSTOSCOPY WITH RETROGRADE PYELOGRAM/URETERAL STENT PLACEMENT;  Surgeon: Janith Lima, MD;  Location: [facility];  Service: Urology;  Laterality: Right;   CYSTOSCOPY/URETEROSCOPY/HOLMIUM LASER/STENT PLACEMENT Right 01/15/2021   Procedure: CYSTOSCOPY/RETROGRADE/URETEROSCOPY/HOLMIUM LASER/STENT EXCHANGE;  Surgeon: Janith Lima, MD;  Location: [facility];  Service: Urology;  Laterality: Right;   ESOPHAGOGASTRODUODENOSCOPY N/A 12/08/2019   normal esophagus with 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   ESOPHAGOGASTRODUODENOSCOPY (EGD) WITH PROPOFOL N/A 05/18/2021   Procedure: ESOPHAGOGASTRODUODENOSCOPY (EGD) WITH PROPOFOL;  Surgeon: Clarene Essex, MD;  Location: [facility];  Service: Endoscopy;  Laterality: N/A;  RFA; APC; ultraslim scope   GIVENS CAPSULE STUDY N/A 01/13/2020   Procedure: GIVENS CAPSULE STUDY;  Surgeon: Daneil Dolin, MD;  [facility];  Service: Endoscopy;  Laterality: N/A;  7:30am   GIVENS CAPSULE STUDY N/A 05/04/2021   Procedure: GIVENS CAPSULE STUDY;  Surgeon: Clarene Essex, MD;  Location: [facility];  Service: Endoscopy;  Laterality: N/A;   MULTIPLE TOOTH EXTRACTIONS     60's   PARATHYROIDECTOMY     POLYPECTOMY  12/08/2019   Procedure:  POLYPECTOMY;  Surgeon: Daneil Dolin, MD;  [facility]: [facility];  Service: Endoscopy;;   RADIOFREQUENCY ABLATION  05/18/2021   Procedure: RADIO FREQUENCY ABLATION;  Surgeon: Clarene Essex, MD;  Location: [facility];  Service: Endoscopy;;   TEE WITHOUT CARDIOVERSION N/A 07/06/2021   Procedure: TRANSESOPHAGEAL ECHOCARDIOGRAM (TEE);  Surgeon: Donato Heinz, MD;  Location: [facility] ENDOSCOPY;  Service: Cardiovascular;  Laterality: N/A;   TOTAL HIP ARTHROPLASTY Right 10/29/2013   Procedure: RIGHT TOTAL HIP ARTHROPLASTY ANTERIOR APPROACH;  Surgeon: Mauri Pole, MD;  Location: [facility];  Service: Orthopedics;  Laterality: Right;    Current Outpatient Medications  Medication Sig Dispense Refill   acetaminophen (TYLENOL) 325 MG tablet Take 2 tablets (650 mg total) by mouth every 6 (six) hours as needed for mild pain or headache (or Fever >/= 101). 12 tablet 0   albuterol (VENTOLIN HFA) 108 (90 Base) MCG/ACT inhaler Inhale 2 puffs into the lungs every 6 (six) hours as needed for wheezing or shortness of breath. 8 g 2   allopurinol (ZYLOPRIM) 100 MG tablet Take 1 tablet (100 mg total) by mouth daily. 90 tablet 1   amLODipine (NORVASC) 2.5 MG tablet Take 1 tablet (2.5 mg total) by mouth daily. 30 tablet 2   apixaban (ELIQUIS) 2.5 MG TABS tablet Take 1 tablet (2.5 mg total) by mouth 2 (two) times daily. 180 tablet 1   atorvastatin (LIPITOR) 20 MG tablet Take 1 tablet (20 mg total) by mouth daily. 90 tablet 1   dapagliflozin propanediol (FARXIGA) 10 MG TABS tablet Take 1 tablet (10 mg total) by mouth daily. 90 tablet 1   docusate sodium (COLACE) 100 MG capsule Take 1 capsule (100 mg total) by mouth 2 (two) times daily. 60 capsule 1   furosemide (LASIX) 20 MG tablet Take 1 tablet (20 mg total) by mouth daily. 90 tablet 2   GNP CRANBERRY EXTRACT PO Take 500 mg by mouth in the morning.     levothyroxine (SYNTHROID) 50 MCG tablet TAKE 1 TABLET BY MOUTH ONCE DAILY BEFORE BREAKFAST 90 tablet 3    loratadine (CLARITIN) 10 MG tablet Take 10 mg by mouth daily.     oxybutynin (DITROPAN-XL) 10 MG 24 hr tablet Take 1 tablet (10 mg total) by mouth at bedtime. 90 tablet 1   pantoprazole (PROTONIX) 40 MG tablet Take 1 tablet (40 mg total) by mouth 2 (two) times daily before a meal. 180 tablet 1   polyethylene glycol (MIRALAX / GLYCOLAX) 17 g packet Take 17 g by mouth daily as needed for mild constipation. 28 each 1   vitamin B-12 (CYANOCOBALAMIN) 50 MCG tablet Take 50 mcg by mouth daily.     Vitamin D3 (VITAMIN D) 25 MCG tablet Take 1,000 Units by mouth daily.     No current facility-administered medications for this visit.    Allergies as of 03/07/2022   (No Known Allergies)    Family History  Problem Relation Age of Onset   Colitis Sister 75       alive   Cancer Sister 69       colon   Cancer Mother 55  uterine   Heart disease Father 26       heart failure   Heart attack Brother 64   Healthy Daughter    Healthy Son    Pulmonary embolism Brother 74   Heart disease Brother    Healthy Son    Healthy Son     Social History   Socioeconomic History   Marital status: Widowed    Spouse name: Not on file   Number of children: 4   Years of education: 12   Highest education level: High school graduate  Occupational History   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 She has four adult children and one son lives with her.    Social Determinants of Health   Financial Resource Strain: Low Risk  (03/04/2022)   Overall Financial Resource Strain (CARDIA)    Difficulty of Paying Living Expenses: Not hard at  all  Food Insecurity: No Food Insecurity (03/04/2022)   Hunger Vital Sign    Worried About Running Out of Food in the Last Year: Never true    Ran Out of Food in the Last Year: Never true  Transportation Needs: No Transportation Needs (03/04/2022)   PRAPARE - Hydrologist (Medical): No    Lack of Transportation (Non-Medical): No  Physical Activity: Insufficiently Active (12/23/2021)   Exercise Vital Sign    Days of Exercise per Week: 7 days    Minutes of Exercise per Session: 20 min  Stress: No Stress Concern Present (03/04/2022)   [place]    Feeling of Stress : Only a little  Social Connections: Moderately Integrated (12/23/2021)   Social Connection and Isolation Panel [NHANES]    Frequency of Communication with Friends and Family: More than three times a week    Frequency of Social Gatherings with Friends and Family: More than three times a week    Attends Religious Services: More than 4 times per year    Active Member of [employer] or Organizations: Yes    Attends Archivist Meetings: More than 4 times per year    Marital Status: Widowed    Review of Systems: Gen: Denies fever, chills, cold or flu like symptoms, pre-syncope, or syncope.  CV: Denies chest pain, palpitations,. Resp: Denies dyspnea, cough.  GI: See HPI Heme: See HPI  Physical Exam: There were no vitals taken for this visit. General:   Alert and oriented. No distress noted. Pleasant and cooperative.  Head:  Normocephalic and atraumatic. Eyes:  Conjuctiva clear without scleral icterus. Heart:  S1, S2 present without murmurs appreciated. Lungs:  Clear to auscultation bilaterally. No wheezes, rales, or rhonchi. No distress.  Abdomen:  +BS, soft, non-tender and non-distended. No rebound or guarding. No HSM or masses noted. Msk:  Symmetrical without gross deformities. Normal posture. Extremities:  Without  edema. Neurologic:  Alert and  oriented x4 Psych:  Normal mood and affect.    Assessment:     Plan:  ***   Aliene Altes, PA-C [facility]  Gastroenterology 03/07/2022

## 2022-03-05 NOTE — H&P (View-Only) (Signed)
Referring Provider: [name], FNP Primary Care Physician:  [name], FNP Primary GI Physician: Dr. Gala Romney  Chief Complaint  Patient presents with   low hemoglobin    Pt states that she had a low hemoglobin of 6.6.     HPI:   [name] [name] is a 81 y.o. female with history of IDA, chronically anticoagulated on Eliquis due to history of PE/DVT, presenting today for acute visit at the request of Dr. Delton Coombes due to iron deficiency anemia requiting blood transfusion.    Extensive GI evaluation in 2021 for IDA including colonoscopy with tubular adenoma, hyperplastic polyp, diverticulosis, colonic lipoma.  EGD with normal esophagus, possible early GAVE, normal duodenum, gastric biopsy negative for H. pylori.  Capsule study with scattered small bowel erosions, abnormal small bowel with edema, inflammation/erosion.  CTA with SMA > 50% stenosis at origin, IMA and celiac patent.   Hospitalized at [facility] in October 2022 with symptomatic anemia. Hemoglobin down to 7. Appears she had capsule endoscopy completed, but report not available in epic.   EGD 05/18/2021 (outpatient) with Dr. Watt Climes: Tiny hiatal hernia, gastric antral vascular ectasia s/p ablation, normal duodenum, normal jejunum.  Hemoglobin normalized in November 2022 and was 13.4 in January 2023.  Hemoglobin down to 10.1 in April 2023, 8.1 02/09/2022 on day of admission for acute hypoxemic respiratory failure in the setting of pneumonia with sepsis.  Stabilized at 7.2.   She established with hematology on 02/24/2022.  Due to hemoglobin of 6.7, she received 1 unit PRBCs on 8/3.  She was also found to have iron 26, saturation 7% ferritin 11, vitamin B12 914, folate19.7 on 8/3. Repeat hemoglobin on 8/9 was down to 6.6, she received 1 unit PRBCs and Feraheme on 8/10.    Today: Denies BRBPR.  She has noticed black stools since resuming iron in April states she resumed once daily in April, but then increase to twice  daily and June or July.  Prior to this, her stools had been brown.  Initially stopped iron in January 2023.  Taking pantoprazole 40 mg BID without reflux.  She has noticed mild nausea every other day. Notices it when she first wakes up. Drinking water and eating helps. Started around July. No vomiting.   Noticed some pain between her shoulder blades radiating anteriorly just below her sternum.  This started after her recent hospitalization in July for sepsis pneumonia.  Symptoms were persistent for about 4 days, but then became intermittent.  She not had any pain for the last 2 days.  Seems to be aggravated by certain movements or with lying on her back.  Also aggravated by wearing a bra.  No association with meals though she did have one episode where she felt like taking her pills because the pain to come on, but this has not recurred.  Denies dysphagia or odynophagia.  Has constipation. Taking MiraLAX and Colace and bowels are moving every day.  She has been working on getting her strength back since her recent hospitalization for sepsis pneumonia.  Respiratory status has improved.  She does not require supplemental oxygen.  She does still get some shortness of breath with activity.  He denies lightheadedness, dizziness, presyncope, syncope, chest pain.  No NSAIDs.  Still taking Eliquis 2.5 mg BID.  Dose was cut back to 2.5 twice daily from 5 twice daily in July.  Sister with colon cancer in her 67s.  Past Medical History:  Diagnosis Date   Anemia  Arthritis    Ostearthritis- hips, knees, fingers   Basal cell carcinoma (BCC) of right hand 10/2021   Dx by [facility]   DVT (deep venous thrombosis) (HCC)    Dyspnea    GERD (gastroesophageal reflux disease)    Headache(784.0)    tx. Valproic acid   History of diabetes mellitus    History of kidney stones    Hypertension    Hypothyroidism    Pulmonary embolism [facility])     Past Surgical History:  Procedure Laterality Date    ABDOMINAL HYSTERECTOMY     BILATERAL HIP ARTHROSCOPY Left    BIOPSY  12/08/2019   Procedure: BIOPSY;  Surgeon: Daneil Dolin, MD;  [facility]: [facility];  Service: Endoscopy;;   CATARACT EXTRACTION, BILATERAL     COLONOSCOPY N/A 12/08/2019   polyps (tubular adenoma), diverticulosis, colonic lipoma, no surveillance due to age   40 W/ [place] Right 12/25/2020   Procedure: CYSTOSCOPY WITH RETROGRADE PYELOGRAM/URETERAL STENT PLACEMENT;  Surgeon: Janith Lima, MD;  Location: [facility];  Service: Urology;  Laterality: Right;   CYSTOSCOPY/URETEROSCOPY/HOLMIUM LASER/STENT PLACEMENT Right 01/15/2021   Procedure: CYSTOSCOPY/RETROGRADE/URETEROSCOPY/HOLMIUM LASER/STENT EXCHANGE;  Surgeon: Janith Lima, MD;  Location: [facility];  Service: Urology;  Laterality: Right;   ESOPHAGOGASTRODUODENOSCOPY N/A 12/08/2019   normal esophagus with 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   ESOPHAGOGASTRODUODENOSCOPY (EGD) WITH PROPOFOL N/A 05/18/2021   Surgeon: Clarene Essex, MD;   Tiny hiatal hernia, gastric antral vascular ectasia s/p ablation, normal duodenum, normal jejunum.   GIVENS CAPSULE STUDY N/A 01/13/2020   Procedure: GIVENS CAPSULE STUDY;  Surgeon: Daneil Dolin, MD;  [facility];  Service: Endoscopy;  Laterality: N/A;  7:30am   GIVENS CAPSULE STUDY N/A 05/04/2021   Procedure: GIVENS CAPSULE STUDY;  Surgeon: Clarene Essex, MD;  [facility];  Service: Endoscopy;  Laterality: N/A;   MULTIPLE TOOTH EXTRACTIONS     60's   PARATHYROIDECTOMY     POLYPECTOMY  12/08/2019   Procedure: POLYPECTOMY;  Surgeon: Daneil Dolin, MD;  [facility];  Service: Endoscopy;;   RADIOFREQUENCY ABLATION  05/18/2021   Procedure: RADIO FREQUENCY ABLATION;  Surgeon: Clarene Essex, MD;  [facility];  Service: Endoscopy;;   TEE WITHOUT CARDIOVERSION N/A 07/06/2021   Procedure: TRANSESOPHAGEAL ECHOCARDIOGRAM (TEE);  Surgeon: Donato Heinz, MD;  Location: [facility] ENDOSCOPY;  Service: Cardiovascular;  Laterality: N/A;   TOTAL HIP ARTHROPLASTY Right 10/29/2013   Procedure: RIGHT TOTAL HIP ARTHROPLASTY ANTERIOR APPROACH;  Surgeon: Mauri Pole, MD;  Location: [facility];  Service: Orthopedics;  Laterality: Right;    Current Outpatient Medications  Medication Sig Dispense Refill   acetaminophen (TYLENOL) 325 MG tablet Take 2 tablets (650 mg total) by mouth every 6 (six) hours as needed for mild pain or headache (or Fever >/= 101). 12 tablet 0   albuterol (VENTOLIN HFA) 108 (90 Base) MCG/ACT inhaler Inhale 2 puffs into the lungs every 6 (six) hours as needed for wheezing or shortness of breath. 8 g 2   allopurinol (ZYLOPRIM) 100 MG tablet Take 1 tablet (100 mg total) by mouth daily. 90 tablet 1   amLODipine (NORVASC) 2.5 MG tablet Take 1 tablet (2.5 mg total) by mouth daily. 30 tablet 2   apixaban (ELIQUIS) 2.5 MG TABS tablet Take 1 tablet (2.5 mg total) by mouth 2 (two) times daily. 180 tablet 1   atorvastatin (LIPITOR) 20 MG tablet Take 1 tablet (20 mg total) by mouth daily. [place]  tablet 1   dapagliflozin propanediol (FARXIGA) 10 MG TABS tablet Take 1 tablet (10 mg total) by mouth daily. 90 tablet 1   docusate sodium (COLACE) 100 MG capsule Take 1 capsule (100 mg total) by mouth 2 (two) times daily. (Patient taking differently: Take 100 mg by mouth daily.) 60 capsule 1   furosemide (LASIX) 20 MG tablet Take 1 tablet (20 mg total) by mouth daily. 90 tablet 2   GNP CRANBERRY EXTRACT PO Take 500 mg by mouth in the morning.     levothyroxine (SYNTHROID) 50 MCG tablet TAKE 1 TABLET BY MOUTH ONCE DAILY BEFORE BREAKFAST 90 tablet 3   loratadine (CLARITIN) 10 MG tablet Take 10 mg by mouth daily.     oxybutynin (DITROPAN-XL) 10 MG 24 hr tablet Take 1 tablet (10 mg total) by mouth at bedtime. 90 tablet 1   pantoprazole (PROTONIX) 40 MG tablet Take 1 tablet (40 mg total) by mouth 2 (two) times daily before a meal. 180 tablet 1    polyethylene glycol (MIRALAX / GLYCOLAX) 17 g packet Take 17 g by mouth daily as needed for mild constipation. 28 each 1   vitamin B-12 (CYANOCOBALAMIN) 50 MCG tablet Take 50 mcg by mouth daily.     Vitamin D3 (VITAMIN D) 25 MCG tablet Take 1,000 Units by mouth daily.     No current facility-administered medications for this visit.    Allergies as of 03/07/2022   (No Known Allergies)    Family History  Problem Relation Age of Onset   Colitis Sister 44       alive   Cancer Sister 51       colon   Cancer Mother 50       uterine   Heart disease Father 71       heart failure   Heart attack Brother 15   Healthy Daughter    Healthy Son    Pulmonary embolism Brother 73   Heart disease Brother    Healthy Son    Healthy Son     Social History   Socioeconomic History   Marital status: Widowed    Spouse name: Not on file   Number of children: 4   Years of education: 12   Highest education level: High school graduate  Occupational History   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 She has four adult children and one son lives with her.    Social Determinants of Health   Financial Resource Strain: Low Risk  (03/04/2022)   Overall Financial Resource Strain (CARDIA)    Difficulty of Paying Living Expenses: Not hard at all  Food Insecurity: No Food Insecurity (03/04/2022)   Hunger Vital Sign    Worried About Running Out of Food in the Last Year: Never true    Ran Out of Food in the Last Year: Never true  Transportation Needs: No Transportation Needs (03/04/2022)   PRAPARE - Radiographer, therapeutic (Medical): No    Lack of Transportation (Non-Medical): No  Physical Activity: Insufficiently  Active (12/23/2021)   Exercise Vital Sign    Days of Exercise per Week: 7 days    Minutes of Exercise per Session: 20 min  Stress: No Stress Concern Present (03/04/2022)   [place]    Feeling of Stress : Only a little  Social Connections: Moderately Integrated (12/23/2021)   Social Connection and Isolation Panel [NHANES]    Frequency of Communication with Friends and Family: More than three times a week    Frequency of Social Gatherings with Friends and Family: More than three times a week    Attends Religious Services: More than 4 times per year    Active Member of [employer] or Organizations: Yes    Attends Archivist Meetings: More than 4 times per year    Marital Status: Widowed    Review of Systems: Gen: Denies fever, chills, cold or flu like symptoms, pre-syncope, or syncope.  CV: Denies chest pain, palpitations,. Resp: Admits to shortness of breath with exertion. GI: See HPI Heme: See HPI  Physical Exam: BP 118/70 (BP Location: Left Arm, Patient Position: Sitting, Cuff Size: Normal)   Pulse 68   Temp 98 F (36.7 C) (Temporal)   Ht '5\' 3"'$  (1.6 m)   Wt 201 lb 3.2 oz (91.3 kg)   SpO2 99%   BMI 35.64 kg/m  General:   Alert and oriented. No distress noted. Pleasant and cooperative.  Walking with a walker.  Unable to get on exam table.  Head:  Normocephalic and atraumatic. Eyes:  Conjuctiva clear without scleral icterus. Heart:  S1, S2 present without murmurs appreciated. Lungs:  Clear to auscultation bilaterally. No wheezes, rales, or rhonchi. No distress.  Abdomen:  +BS, soft, non-tender and non-distended. No rebound or guarding. No HSM or masses noted. Msk:  Symmetrical without gross deformities. Normal posture. Extremities:  Without edema. Neurologic:  Alert and  oriented x4 Psych:  Normal mood  and affect.    Assessment:  81 y.o. female with history of IDA, chronically anticoagulated on Eliquis due to history of PE/DVT, presenting today for acute visit at the request of Dr. Delton Coombes due to iron deficiency anemia requiting blood transfusion.   She had complete GI evaluation in 2021 colonoscopy (technically difficult) with tubular adenoma and hyperplastic polyp,  EGD with possible early GAVE and gastric biopsy negative for H. Pylori, capsule endoscopy with inflammation/erosion.   CTA with SMA > 50% stenosis at origin, IMA and celiac patent. Apparently had repeat capsule study while hospitalized Mr. Laverta Baltimore in October 2022 for symptomatic anemia with hemoglobin down to 7.  Results are not available in the chart, but she later underwent EGD/enteroscopy with Dr. Watt Climes in October which revealed tiny hiatal hernia, GAVE s/p ablation, normal duodenum and jejunum.  Hemoglobin normalized in November 2022.  Oral iron discontinued in January.  Hemoglobin remained normal until April 2023 and has since been declining.  Oral iron resumed in April.  She has established with hematology.  Hemoglobin down to 6.7 on 8/3 s/p 1 unit PRBCs.  Repeat hemoglobin was 6.6 on 8/9 and she received another unit of PRBCs as well as Feraheme on 8/10.  She admits to black stools since resuming iron, previously brown.  No BRBPR.  She remains on PPI BID and denies reflux symptoms though she does report intermittent morning nausea since July that is improved by eating/drinking.  Also with nonspecific pain between her shoulder blades that radiates to the epigastric area just below her sternum.  This seems more MSK in etiology as she denies any association with meals though she did have 1 episode where she felt that taking medications may have triggered the pain. Today, her abdominal exam is benign and she hasn't had any back pain for the last 2 days. Denies NSAIDs.   I suspect we are likely dealing with recurrent bleeding from known  GAVE. Unable to rule out PUD, small bowel or colon AMVs, or malignancy. Discussed arranging EGD and colonoscopy, but patient requested to hold off on colonoscopy. Earliest availability for EGD is 8/17 with Dr. Abbey Chatters. Considered adding possible capsule to EGD, but as she has been on iron, we will hold off on capsule for now and pursue if EGD is unrevealing as she should hold iron for 7 days to ensure appropriate visualization.    Plan:  CBC Proceed with upper endoscopy with propofol by Dr. Abbey Chatters (due to urgency of procedure/schedule availability) ASAP. The risks, benefits, and alternatives have been discussed with the patient in detail. The patient states understanding and desires to proceed.  ASA 3 We will get approval to hold Eliquis x 48 hours prior to EGD.  No AM diabetes medications.  She will start holding iron today incase Given's is recommended after EGD.  Continue Protonix 40 mg BID.  Continue to avoid NSAIDs.  Follow-up after EGD.    Aliene Altes, PA-C [facility] 03/07/2022

## 2022-03-07 ENCOUNTER — Encounter: Payer: Self-pay | Admitting: Gastroenterology

## 2022-03-07 ENCOUNTER — Encounter: Payer: Self-pay | Admitting: *Deleted

## 2022-03-07 ENCOUNTER — Other Ambulatory Visit (HOSPITAL_COMMUNITY)
Admission: RE | Admit: 2022-03-07 | Discharge: 2022-03-07 | Disposition: A | Discharge status: Home / Self Care | Payer: PPO | Source: Ambulatory Visit | Attending: Gastroenterology | Admitting: Gastroenterology

## 2022-03-07 ENCOUNTER — Ambulatory Visit: Payer: PPO | Admitting: Gastroenterology

## 2022-03-07 ENCOUNTER — Telehealth: Payer: Self-pay | Admitting: *Deleted

## 2022-03-07 VITALS — BP 118/70 | HR 68 | Temp 98.0°F | Ht 63.0 in | Wt 201.2 lb

## 2022-03-07 DIAGNOSIS — D509 Iron deficiency anemia, unspecified: Secondary | ICD-10-CM | POA: Insufficient documentation

## 2022-03-07 DIAGNOSIS — R110 Nausea: Secondary | ICD-10-CM | POA: Diagnosis not present

## 2022-03-07 LAB — CBC WITH DIFFERENTIAL/PLATELET
Abs Immature Granulocytes: 0.05 10*3/uL (ref 0.00–0.07)
Basophils Absolute: 0 10*3/uL (ref 0.0–0.1)
Basophils Relative: 1 %
Eosinophils Absolute: 0.1 10*3/uL (ref 0.0–0.5)
Eosinophils Relative: 2 %
HCT: 26.8 % — ABNORMAL LOW (ref 36.0–46.0)
Hemoglobin: 7.8 g/dL — ABNORMAL LOW (ref 12.0–15.0)
Immature Granulocytes: 1 %
Lymphocytes Relative: 24 %
Lymphs Abs: 1.3 10*3/uL (ref 0.7–4.0)
MCH: 28.4 pg (ref 26.0–34.0)
MCHC: 29.1 g/dL — ABNORMAL LOW (ref 30.0–36.0)
MCV: 97.5 fL (ref 80.0–100.0)
Monocytes Absolute: 0.5 10*3/uL (ref 0.1–1.0)
Monocytes Relative: 9 %
Neutro Abs: 3.5 10*3/uL (ref 1.7–7.7)
Neutrophils Relative %: 63 %
Platelets: 353 10*3/uL (ref 150–400)
RBC: 2.75 MIL/uL — ABNORMAL LOW (ref 3.87–5.11)
RDW: 18.6 % — ABNORMAL HIGH (ref 11.5–15.5)
WBC: 5.4 10*3/uL (ref 4.0–10.5)
nRBC: 0 % (ref 0.0–0.2)

## 2022-03-07 NOTE — Telephone Encounter (Signed)
Great, thank you!   Yes, per anesthesia's most recent recommendations as of today, farxiga needs to be held 72 hrs prior to procedure.

## 2022-03-07 NOTE — Patient Instructions (Signed)
We will arrange for you to have an upper endoscopy on Thursday 03/10/22 with Dr. Abbey Chatters.  We are getting approval to hold Eliquis x 48 hours prior to your procedure.   Please start holding iron today as we may need you to complete a capsule endoscopy if your EGD is unrevealing.   Continue pantoprazole 40 mg twice daily.  Continue to avoid all NSAID products.  We will follow-up with you in the office after your upper endoscopy.  Do not hesitate to call if you have any questions or concerns prior to your next visit.  Aliene Altes, PA-C [facility]

## 2022-03-07 NOTE — Telephone Encounter (Signed)
We are just needing the okay for patient to hold her eliquis x 2 days to have procedure done.

## 2022-03-07 NOTE — Telephone Encounter (Signed)
Does she need clearance? If so, is there a form to complete? Or is this just an FYI?

## 2022-03-07 NOTE — Telephone Encounter (Signed)
Deferring to you

## 2022-03-07 NOTE — Telephone Encounter (Signed)
  What type of surgery is being performed? ASAP Egd with possible GIVENS capsule placement  When is surgery scheduled? TBD  Are there any medications that need to be held prior to surgery and how long? Eliquis x 2 days  Name of physician performing surgery?  Dr. Arvilla Me[facility] Phone: [phone] Fax: [phone]  Anethesia type (none, local, MAC, general)? MAC

## 2022-03-07 NOTE — Telephone Encounter (Signed)
It's ok to hold her Eliquis for 2 day for the procedure. Thank you!

## 2022-03-07 NOTE — Telephone Encounter (Signed)
Spoke with pt. Scheduled for 8/17 at 10:45am. Aware to hold eliquis/farxiga starting tomorrow until after procedure. Instructions to via mychart. Also made aware will call back with pre-op appt.

## 2022-03-08 NOTE — Pre-Procedure Instructions (Signed)
CBC results routed to Dr Abbey Chatters.

## 2022-03-09 ENCOUNTER — Encounter (HOSPITAL_COMMUNITY)
Admission: RE | Admit: 2022-03-09 | Discharge: 2022-03-09 | Disposition: A | Discharge status: Home / Self Care | Payer: PPO | Source: Ambulatory Visit | Attending: Internal Medicine | Admitting: Internal Medicine

## 2022-03-10 ENCOUNTER — Encounter (HOSPITAL_COMMUNITY): Payer: Self-pay

## 2022-03-10 ENCOUNTER — Ambulatory Visit (HOSPITAL_COMMUNITY): Payer: PPO | Admitting: Anesthesiology

## 2022-03-10 ENCOUNTER — Inpatient Hospital Stay: Payer: PPO

## 2022-03-10 ENCOUNTER — Ambulatory Visit (HOSPITAL_COMMUNITY)
Admission: RE | Admit: 2022-03-10 | Discharge: 2022-03-10 | Disposition: A | Discharge status: Home / Self Care | Payer: PPO | Attending: Internal Medicine | Admitting: Internal Medicine

## 2022-03-10 ENCOUNTER — Ambulatory Visit (HOSPITAL_BASED_OUTPATIENT_CLINIC_OR_DEPARTMENT_OTHER): Payer: PPO | Admitting: Anesthesiology

## 2022-03-10 ENCOUNTER — Ambulatory Visit: Payer: PPO

## 2022-03-10 ENCOUNTER — Encounter (HOSPITAL_COMMUNITY)
Admission: RE | Disposition: A | Discharge status: Home / Self Care | Payer: Self-pay | Source: Home / Self Care | Attending: Internal Medicine

## 2022-03-10 DIAGNOSIS — I4891 Unspecified atrial fibrillation: Secondary | ICD-10-CM | POA: Diagnosis not present

## 2022-03-10 DIAGNOSIS — D500 Iron deficiency anemia secondary to blood loss (chronic): Secondary | ICD-10-CM | POA: Diagnosis not present

## 2022-03-10 DIAGNOSIS — K449 Diaphragmatic hernia without obstruction or gangrene: Secondary | ICD-10-CM | POA: Insufficient documentation

## 2022-03-10 DIAGNOSIS — Z86711 Personal history of pulmonary embolism: Secondary | ICD-10-CM | POA: Insufficient documentation

## 2022-03-10 DIAGNOSIS — G709 Myoneural disorder, unspecified: Secondary | ICD-10-CM | POA: Insufficient documentation

## 2022-03-10 DIAGNOSIS — K219 Gastro-esophageal reflux disease without esophagitis: Secondary | ICD-10-CM | POA: Insufficient documentation

## 2022-03-10 DIAGNOSIS — K31819 Angiodysplasia of stomach and duodenum without bleeding: Secondary | ICD-10-CM

## 2022-03-10 DIAGNOSIS — Z79899 Other long term (current) drug therapy: Secondary | ICD-10-CM | POA: Diagnosis not present

## 2022-03-10 DIAGNOSIS — E039 Hypothyroidism, unspecified: Secondary | ICD-10-CM | POA: Diagnosis not present

## 2022-03-10 DIAGNOSIS — I10 Essential (primary) hypertension: Secondary | ICD-10-CM | POA: Diagnosis not present

## 2022-03-10 DIAGNOSIS — M1990 Unspecified osteoarthritis, unspecified site: Secondary | ICD-10-CM | POA: Diagnosis not present

## 2022-03-10 DIAGNOSIS — D509 Iron deficiency anemia, unspecified: Secondary | ICD-10-CM

## 2022-03-10 HISTORY — PX: ESOPHAGOGASTRODUODENOSCOPY (EGD) WITH PROPOFOL: SHX5813

## 2022-03-10 SURGERY — ESOPHAGOGASTRODUODENOSCOPY (EGD) WITH PROPOFOL
Anesthesia: General

## 2022-03-10 MED ORDER — PROPOFOL 10 MG/ML IV BOLUS
INTRAVENOUS | Status: DC | PRN
  Administered 2022-03-10: 80 mg via INTRAVENOUS
  Administered 2022-03-10 (×3): 50 mg via INTRAVENOUS

## 2022-03-10 MED ORDER — LACTATED RINGERS IV SOLN
INTRAVENOUS | Status: DC
  Administered 2022-03-10: 1000 mL via INTRAVENOUS

## 2022-03-10 MED ORDER — PROPOFOL 1000 MG/100ML IV EMUL
INTRAVENOUS | Status: AC
  Filled 2022-03-10: qty 100

## 2022-03-10 MED ORDER — PROPOFOL 500 MG/50ML IV EMUL
INTRAVENOUS | Status: AC
  Filled 2022-03-10: qty 50

## 2022-03-10 NOTE — Discharge Instructions (Addendum)
EGD Discharge instructions Please read the instructions outlined below and refer to this sheet in the next few weeks. These discharge instructions provide you with general information on caring for yourself after you leave the hospital. Your doctor may also give you specific instructions. While your treatment has been planned according to the most current medical practices available, unavoidable complications occasionally occur. If you have any problems or questions after discharge, please call your doctor. ACTIVITY You may resume your regular activity but move at a slower pace for the next 24 hours.  Take frequent rest periods for the next 24 hours.  Walking will help expel (get rid of) the air and reduce the bloated feeling in your abdomen.  No driving for 24 hours (because of the anesthesia (medicine) used during the test).  You may shower.  Do not sign any important legal documents or operate any machinery for 24 hours (because of the anesthesia used during the test).  NUTRITION Drink plenty of fluids.  You may resume your normal diet.  Begin with a light meal and progress to your normal diet.  Avoid alcoholic beverages for 24 hours or as instructed by your caregiver.  MEDICATIONS You may resume your normal medications unless your caregiver tells you otherwise.  WHAT YOU CAN EXPECT TODAY You may experience abdominal discomfort such as a feeling of fullness or "gas" pains.  FOLLOW-UP Your doctor will discuss the results of your test with you.  SEEK IMMEDIATE MEDICAL ATTENTION IF ANY OF THE FOLLOWING OCCUR: Excessive nausea (feeling sick to your stomach) and/or vomiting.  Severe abdominal pain and distention (swelling).  Trouble swallowing.  Temperature over 101 F (37.8 C).  Rectal bleeding or vomiting of blood.   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  I treated this extensively with coagulation.  Continue to follow with hematology for iron.  Hold Eliquis for 2 more  days.  Follow-up with GI in 3 months.   I hope you have a great rest of your week!  Elon Alas. Abbey Chatters, D.O. Gastroenterology and Hepatology [facility]

## 2022-03-10 NOTE — Interval H&P Note (Signed)
History and Physical Interval Note:  03/10/2022 10:02 AM  [name] [name]  has presented today for surgery, with the diagnosis of IDA.  The various methods of treatment have been discussed with the patient and family. After consideration of risks, benefits and other options for treatment, the patient has consented to  Procedure(s) with comments: ESOPHAGOGASTRODUODENOSCOPY (EGD) WITH PROPOFOL (N/A) - 10:45am as a surgical intervention.  The patient's history has been reviewed, patient examined, no change in status, stable for surgery.  I have reviewed the patient's chart and labs.  Questions were answered to the patient's satisfaction.     Eloise Harman

## 2022-03-10 NOTE — Anesthesia Preprocedure Evaluation (Signed)
Anesthesia Evaluation  Patient identified by MRN, date of birth, ID band Patient awake    Reviewed: Allergy & Precautions, NPO status , Patient's Chart, lab work & pertinent test results  Airway Mallampati: II  TM Distance: >3 FB Neck ROM: Full    Dental  (+) Edentulous Upper, Edentulous Lower   Pulmonary shortness of breath and with exertion, pneumonia, PE   Pulmonary exam normal        Cardiovascular hypertension, Pt. on medications Normal cardiovascular exam+ dysrhythmias Atrial Fibrillation + Valvular Problems/Murmurs MR  Rhythm:Regular Rate:Normal     Neuro/Psych  Headaches,  Neuromuscular disease negative psych ROS   GI/Hepatic Neg liver ROS, GERD  Medicated and Controlled,  Endo/Other  Hypothyroidism   Renal/GU Renal disease  negative genitourinary   Musculoskeletal  (+) Arthritis , Osteoarthritis,    Abdominal   Peds negative pediatric ROS (+)  Hematology  (+) Blood dyscrasia, anemia ,   Anesthesia Other Findings   Reproductive/Obstetrics negative OB ROS                             Anesthesia Physical Anesthesia Plan  ASA: 3  Anesthesia Plan: General   Post-op Pain Management: Minimal or no pain anticipated   Induction: Intravenous  PONV Risk Score and Plan:   Airway Management Planned:   Additional Equipment:   Intra-op Plan:   Post-operative Plan:   Informed Consent:   Plan Discussed with:   Anesthesia Plan Comments:         Anesthesia Quick Evaluation

## 2022-03-10 NOTE — Transfer of Care (Signed)
Immediate Anesthesia Transfer of Care Note  Patient: [name] [name]  Procedure(s) Performed: ESOPHAGOGASTRODUODENOSCOPY (EGD) WITH PROPOFOL  Patient Location: PACU  Anesthesia Type:General  Level of Consciousness: awake, alert  and oriented  Airway & Oxygen Therapy: Patient Spontanous Breathing  Post-op Assessment: Report given to RN, Post -op Vital signs reviewed and stable, Patient moving all extremities X 4 and Patient able to stick tongue midline  Post vital signs: Reviewed  Last Vitals:  Vitals Value Taken Time  BP 101/49 03/10/22 1026  Temp 36.5 C 03/10/22 1026  Pulse 77 03/10/22 1026  Resp 21 03/10/22 1026  SpO2 90 % 03/10/22 1026    Last Pain:  Vitals:   03/10/22 1026  TempSrc: Axillary  PainSc: Asleep      Patients Stated Pain Goal: 7 (93/57/01 7793)  Complications: No notable events documented.

## 2022-03-10 NOTE — Op Note (Signed)
[facility] Patient Name: [name] [name] Procedure Date: 03/10/2022 10:01 AM MRN: [id number] Date of Birth: 1941/01/12 Attending MD: Elon Alas. Edgar Frisk CSN: [id number] Age: 81 Admit Type: Outpatient Procedure:                Upper GI endoscopy Indications:              Iron deficiency anemia secondary to chronic blood                            loss Providers:                Elon Alas. Abbey Chatters, DO, Charlsie Quest. Theda Sers RN, RN,                            Rosina Lowenstein, RN, Ladoris Gene Technician, Merchant navy officer Referring MD:              Medicines:                See the Anesthesia note for documentation of the                            administered medications Complications:            No immediate complications. Estimated Blood Loss:     Estimated blood loss: none. Procedure:                Pre-Anesthesia Assessment:                           - The anesthesia plan was to use monitored                            anesthesia care (MAC).                           After obtaining informed consent, the endoscope was                            passed under direct vision. Throughout the                            procedure, the patient's blood pressure, pulse, and                            oxygen saturations were monitored continuously. The                            [id number]-H190 ([id number]) scope was introduced through the                            mouth, and advanced to the second part of duodenum.                            The upper GI endoscopy was accomplished without                            difficulty.  The patient tolerated the procedure                            well. Scope In: 10:13:55 AM Scope Out: 10:22:33 AM Total Procedure Duration: 0 hours 8 minutes 38 seconds  Findings:      A small hiatal hernia was present.      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 Coagulation for tissue destruction using argon       plasma was  successful.      The duodenal bulb, first portion of the duodenum and second portion of       the duodenum were normal. Impression:               - Small hiatal hernia.                           - Gastric antral vascular ectasia without bleeding.                            Treated with argon plasma coagulation (APC).                           - Normal duodenal bulb, first portion of the                            duodenum and second portion of the duodenum.                           - No specimens collected. Moderate Sedation:      Per Anesthesia Care Recommendation:           - Patient has a contact number available for                            emergencies. The signs and symptoms of potential                            delayed complications were discussed with the                            patient. Return to normal activities tomorrow.                            Written discharge instructions were provided to the                            patient.                           - Resume previous diet.                           - Continue present medications.                           - Repeat upper endoscopy PRN for retreatment.                           -  Return to GI office in 3 months. Procedure Code(s):        --- Professional ---                           [phone], Esophagogastroduodenoscopy, flexible,                            transoral; with ablation of tumor(s), polyp(s), or                            other lesion(s) (includes pre- and post-dilation                            and guide wire passage, when performed) Diagnosis Code(s):        --- Professional ---                           K44.9, Diaphragmatic hernia without obstruction or                            gangrene                           K31.819, Angiodysplasia of stomach and duodenum                            without bleeding                           D50.0, Iron deficiency anemia secondary to blood                             loss (chronic) CPT copyright 2019 American Medical Association. All rights reserved. The codes documented in this report are preliminary and upon coder review may  be revised to meet current compliance requirements. Elon Alas. Abbey Chatters, DO [place] Abbey Chatters, DO 03/10/2022 10:26:38 AM This report has been signed electronically. Number of Addenda: 0

## 2022-03-10 NOTE — Anesthesia Postprocedure Evaluation (Signed)
Anesthesia Post Note  Patient: [name] [name]  Procedure(s) Performed: ESOPHAGOGASTRODUODENOSCOPY (EGD) WITH PROPOFOL  Patient location during evaluation: Phase II Anesthesia Type: General Level of consciousness: awake and alert and oriented Pain management: pain level controlled Vital Signs Assessment: post-procedure vital signs reviewed and stable Respiratory status: spontaneous breathing, nonlabored ventilation and respiratory function stable Cardiovascular status: blood pressure returned to baseline and stable Postop Assessment: no apparent nausea or vomiting Anesthetic complications: no   No notable events documented.   Last Vitals:  Vitals:   03/10/22 0932 03/10/22 1026  BP: (!) 153/61 (!) 101/49  Pulse:  77  Resp: 18 (!) 21  Temp: 36.7 C 36.5 C  SpO2: 97% 90%    Last Pain:  Vitals:   03/10/22 1026  TempSrc: Axillary  PainSc: Asleep                 Emanie Behan C Andretta Ergle

## 2022-03-11 ENCOUNTER — Other Ambulatory Visit: Payer: Self-pay | Admitting: *Deleted

## 2022-03-11 ENCOUNTER — Other Ambulatory Visit: Payer: Self-pay

## 2022-03-11 DIAGNOSIS — D500 Iron deficiency anemia secondary to blood loss (chronic): Secondary | ICD-10-CM

## 2022-03-11 NOTE — Patient Instructions (Signed)
Visit Information  Thank you for taking time to visit with me today. Please don't hesitate to contact me if I can be of assistance to you.   Following are the goals we discussed today:   Goals Addressed   None     Our next appointment is by telephone on 03/15/22 at 1:30 pm  Please call the care guide team at [phone] if you need to cancel or reschedule your appointment.   If you are experiencing a Mental Health or [place] or need someone to talk to, please call the Suicide and Crisis Lifeline: 988 call the [place] National Suicide Prevention Lifeline: (832)594-0797 or TTY: 347-031-9203 TTY 979-600-9460) to talk to a trained counselor call 1-800-273-TALK (toll free, 24 hour hotline) call the [facility]: [phone] call 911   Patient verbalizes understanding of instructions and care plan provided today and agrees to view in [place]. Active MyChart status and patient understanding of how to access instructions and care plan via MyChart confirmed with patient.     The patient has been provided with contact information for the care management team and has been advised to call with any health related questions or concerns.   [place] Lavina Hamman, RN, BSN, [place] Coordinator Office number [phone]

## 2022-03-11 NOTE — Patient Outreach (Signed)
  Care Coordination   Follow Up Visit Note   03/11/2022 Name: [name] [name] MRN: [id number] DOB: 26-Dec-1940  [name] [name] is a 81 y.o. year old female who sees [name], FNP for primary care. I spoke with  [name] by phone today  What matters to the patients health and wellness today?  Completed EGD Pending further labs, atrial fibrillation treatment and action plan reviewed, missing AVS education (RN CM to attempt to find out if there is an IT issue)   Goals Addressed               This Visit's Progress     Patient Stated     manage atrial fibrillation [facility]) (pt-stated)   On track     Care Coordination Interventions: 03/11/22 Reviewed importance of adherence to anticoagulant exactly as prescribed Counseled on importance of regular laboratory monitoring as prescribed Afib action plan reviewed Assessed social determinant of health barriers Discussed the similar symptoms of atrial fibrillation, anemia and hypothyroidism.      manage iron deficiency anemia [facility]) (pt-stated)   On track     Start date 03/04/22  Care Coordination Interventions: Counseled on bleeding risk associated with iron deficiency anemia and importance of self-monitoring for signs/symptoms of bleeding Counseled on importance of regular laboratory monitoring as directed by provider Advised to call provider or 911 if active bleeding or signs and symptoms of active bleeding occur encouraged optimal oral intake to support fluid balance and nutrition encouraged dietary changes to increase dietary intake of iron, Vitamin W43 and folic acid as advised/prescribed Screening for signs and symptoms of depression related to chronic disease state Assessed social determinant of health barriers  encouraged dietary changes to increase dietary intake of iron, vitamin C, constipation management  online education provided via my chart for iron rich diet after discussed better choices of iron rich  foods in detail, preventing iron deficiency anemia, atrial fibrillation (article & video) esophageal varices Assessed knowledge and home symptoms related to atrial fibrillation, Reviewed symptoms to monitor (sweating, back) 03/11/22 reviewed recent EGD results, pending further labs        SDOH assessments and interventions completed:  Yes  SDOH Interventions Today    Flowsheet Row Most Recent Value  SDOH Interventions   Food Insecurity Interventions Intervention Not Indicated  Financial Strain Interventions Intervention Not Indicated  Stress Interventions Intervention Not Indicated  Transportation Interventions Intervention Not Indicated        Care Coordination Interventions Activated:  Yes  Care Coordination Interventions:  Yes, provided   Follow up plan: Follow up call scheduled for 03/15/22 1330    Encounter Outcome:  Pt. Visit Completed   Hephzibah Strehle L. Lavina Hamman, RN, BSN, [place] Coordinator Office number [phone]

## 2022-03-11 NOTE — Progress Notes (Unsigned)
[place], [place] [address]   CLINIC:  Medical Oncology/Hematology  PCP:  [name], [place] [address] [phone]   REASON FOR VISIT:  Follow-up for iron deficiency anemia  PRIOR THERAPY: Blood transfusions and oral iron tablets  CURRENT THERAPY: Intermittent IV iron  INTERVAL HISTORY:  [name] [name] 81 y.o. female returns for routine follow-up of her iron deficiency anemia.  She was seen for initial consultation by Dr. Delton Coombes on 02/24/2022.  She received blood transfusions, most recently on 02/24/2022 (Hgb 6.7) and again on 03/03/2022 (Hgb 6.6).  She received Feraheme on 03/03/2022, and is scheduled to receive her second dose of Feraheme after her appointment today.  Symptoms after iron and blood transfusion *** *** Bleeding (dark blacks tools) *** Fatigue (generalized weakness) *** Pica, RLS, headaches *** CP, DOE, LH, syncope  She is taking iron tablets twice daily.  *** She remains on Eliquis 2.5 mg BID for history of unprovoked DVT and PE in 2020.  ***  She has ***% energy and ***% appetite. She endorses that she is maintaining a stable weight.   REVIEW OF SYSTEMS: *** Review of Systems - Oncology    PAST MEDICAL/SURGICAL HISTORY:  Past Medical History:  Diagnosis Date   Anemia    Arthritis    Ostearthritis- hips, knees, fingers   Basal cell carcinoma (BCC) of right hand 10/2021   Dx by [facility]   DVT (deep venous thrombosis) (HCC)    Dyspnea    GERD (gastroesophageal reflux disease)    Headache(784.0)    tx. Valproic acid   History of diabetes mellitus    History of kidney stones    Hypertension    Hypothyroidism    Pulmonary embolism [facility])    Past Surgical History:  Procedure Laterality Date   ABDOMINAL HYSTERECTOMY     BILATERAL HIP ARTHROSCOPY Left    BIOPSY  12/08/2019   Procedure: BIOPSY;  Surgeon: Daneil Dolin, MD;  [facility];  Service: Endoscopy;;    CATARACT EXTRACTION, BILATERAL     COLONOSCOPY N/A 12/08/2019   polyps (tubular adenoma), diverticulosis, colonic lipoma, no surveillance due to age   13 W/ [place] Right 12/25/2020   Procedure: CYSTOSCOPY WITH RETROGRADE PYELOGRAM/URETERAL STENT PLACEMENT;  Surgeon: Janith Lima, MD;  Location: [facility] ORS;  Service: Urology;  Laterality: Right;   CYSTOSCOPY/URETEROSCOPY/HOLMIUM LASER/STENT PLACEMENT Right 01/15/2021   Procedure: CYSTOSCOPY/RETROGRADE/URETEROSCOPY/HOLMIUM LASER/STENT EXCHANGE;  Surgeon: Janith Lima, MD;  Location: [facility];  Service: Urology;  Laterality: Right;   ESOPHAGOGASTRODUODENOSCOPY N/A 12/08/2019   normal esophagus with 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   ESOPHAGOGASTRODUODENOSCOPY (EGD) WITH PROPOFOL N/A 05/18/2021   Surgeon: Clarene Essex, MD;   Tiny hiatal hernia, gastric antral vascular ectasia s/p ablation, normal duodenum, normal jejunum.   GIVENS CAPSULE STUDY N/A 01/13/2020   Procedure: GIVENS CAPSULE STUDY;  Surgeon: Daneil Dolin, MD;  [facility];  Service: Endoscopy;  Laterality: N/A;  7:30am   GIVENS CAPSULE STUDY N/A 05/04/2021   Procedure: GIVENS CAPSULE STUDY;  Surgeon: Clarene Essex, MD;  [facility]: [facility];  Service: Endoscopy;  Laterality: N/A;   MULTIPLE TOOTH EXTRACTIONS     60's   PARATHYROIDECTOMY     POLYPECTOMY  12/08/2019   Procedure: POLYPECTOMY;  Surgeon: Daneil Dolin, MD;  [facility];  Service: Endoscopy;;   RADIOFREQUENCY ABLATION  05/18/2021   Procedure: RADIO FREQUENCY ABLATION;  Surgeon: Clarene Essex, MD;  [facility];  Service: Endoscopy;;   TEE WITHOUT CARDIOVERSION N/A 07/06/2021   Procedure: TRANSESOPHAGEAL ECHOCARDIOGRAM (TEE);  Surgeon: Donato Heinz, MD;  Location: [facility] ENDOSCOPY;  Service: Cardiovascular;  Laterality: N/A;   TOTAL HIP ARTHROPLASTY Right 10/29/2013   Procedure: RIGHT TOTAL HIP ARTHROPLASTY ANTERIOR  APPROACH;  Surgeon: Mauri Pole, MD;  Location: [facility];  Service: Orthopedics;  Laterality: Right;     SOCIAL HISTORY:  Social History   Socioeconomic History   Marital status: Widowed    Spouse name: Not on file   Number of children: 4   Years of education: 12   Highest education level: High school graduate  Occupational History   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 She has four adult children and one son lives with her.    Social Determinants of Health   Financial Resource Strain: High Risk (03/11/2022)   Overall Financial Resource Strain (CARDIA)    Difficulty of Paying Living Expenses: Very hard  Food Insecurity: No Food Insecurity (03/11/2022)   Hunger Vital Sign    Worried About Running Out of Food in the Last Year: Never true    Ran Out of Food in the Last Year: Never true  Transportation Needs: No Transportation Needs (03/11/2022)   PRAPARE - Hydrologist (Medical): No    Lack of Transportation (Non-Medical): No  Physical Activity: Insufficiently Active (12/23/2021)   Exercise Vital Sign    Days of Exercise per Week: 7 days    Minutes of Exercise per Session: 20 min  Stress: No Stress Concern Present (03/04/2022)   [place]    Feeling of Stress : Only a little  Social Connections: Moderately Integrated (12/23/2021)   Social Connection and Isolation Panel [NHANES]    Frequency of Communication with Friends and Family: More than three times a week    Frequency of Social Gatherings with Friends and  Family: More than three times a week    Attends Religious Services: More than 4 times per year    Active Member of [employer] or Organizations: Yes    Attends Archivist Meetings: More than 4 times per year    Marital Status: Widowed  Intimate Partner Violence: Not At Risk (03/04/2022)   Humiliation, Afraid, Rape, and Kick questionnaire    Fear of Current or Ex-Partner: No    Emotionally Abused: No    Physically Abused: No    Sexually Abused: No    FAMILY HISTORY:  Family History  Problem Relation Age of Onset   Colitis Sister 30       alive   Cancer Sister 52       colon   Cancer Mother 61       uterine   Heart disease Father 77       heart failure   Heart attack Brother 25   Healthy Daughter    Healthy Son    Pulmonary embolism Brother 75  Heart disease Brother    Healthy Son    Healthy Son     CURRENT MEDICATIONS:  Outpatient Encounter Medications as of 03/14/2022  Medication Sig Note   acetaminophen (TYLENOL) 325 MG tablet Take 2 tablets (650 mg total) by mouth every 6 (six) hours as needed for mild pain or headache (or Fever >/= 101).    albuterol (VENTOLIN HFA) 108 (90 Base) MCG/ACT inhaler Inhale 2 puffs into the lungs every 6 (six) hours as needed for wheezing or shortness of breath. 03/08/2022: Has not used since hospital discharge but still has on hand   allopurinol (ZYLOPRIM) 100 MG tablet Take 1 tablet (100 mg total) by mouth daily.    amLODipine (NORVASC) 2.5 MG tablet Take 1 tablet (2.5 mg total) by mouth daily.    apixaban (ELIQUIS) 2.5 MG TABS tablet Take 1 tablet (2.5 mg total) by mouth 2 (two) times daily. (Patient not taking: Reported on 03/08/2022)    atorvastatin (LIPITOR) 20 MG tablet Take 1 tablet (20 mg total) by mouth daily.    dapagliflozin propanediol (FARXIGA) 10 MG TABS tablet Take 1 tablet (10 mg total) by mouth daily.    docusate sodium (COLACE) 100 MG capsule Take 1 capsule (100 mg total) by mouth 2 (two) times daily. (Patient taking  differently: Take 100 mg by mouth daily as needed for moderate constipation.)    furosemide (LASIX) 20 MG tablet Take 1 tablet (20 mg total) by mouth daily.    GNP CRANBERRY EXTRACT PO Take 500 mg by mouth in the morning.    levothyroxine (SYNTHROID) 50 MCG tablet TAKE 1 TABLET BY MOUTH ONCE DAILY BEFORE BREAKFAST    loratadine (CLARITIN) 10 MG tablet Take 10 mg by mouth daily.    oxybutynin (DITROPAN-XL) 10 MG 24 hr tablet Take 1 tablet (10 mg total) by mouth at bedtime.    pantoprazole (PROTONIX) 40 MG tablet Take 1 tablet (40 mg total) by mouth 2 (two) times daily before a meal.    polyethylene glycol (MIRALAX / GLYCOLAX) 17 g packet Take 17 g by mouth daily as needed for mild constipation. (Patient taking differently: Take 17 g by mouth daily.)    vitamin B-12 (CYANOCOBALAMIN) 50 MCG tablet Take 50 mcg by mouth daily.    Vitamin D3 (VITAMIN D) 25 MCG tablet Take 1,000 Units by mouth daily.    No facility-administered encounter medications on file as of 03/14/2022.    ALLERGIES:  No Known Allergies   PHYSICAL EXAM: *** ECOG PERFORMANCE STATUS: {CHL ONC ECOG PS:[id number]}  There were no vitals filed for this visit. There were no vitals filed for this visit. Physical Exam   LABORATORY DATA:  I have reviewed the labs as listed.  CBC    Component Value Date/Time   WBC 5.4 03/07/2022 1310   RBC 2.75 (L) 03/07/2022 1310   HGB 7.8 (L) 03/07/2022 1310   HGB 7.3 (LL) 02/18/2022 1111   HCT 26.8 (L) 03/07/2022 1310   HCT 23.3 (L) 02/18/2022 1111   PLT 353 03/07/2022 1310   PLT 430 02/18/2022 1111   MCV 97.5 03/07/2022 1310   MCV 90 02/18/2022 1111   MCH 28.4 03/07/2022 1310   MCHC 29.1 (L) 03/07/2022 1310   RDW 18.6 (H) 03/07/2022 1310   RDW 13.7 02/18/2022 1111   LYMPHSABS 1.3 03/07/2022 1310   LYMPHSABS 0.8 02/18/2022 1111   MONOABS 0.5 03/07/2022 1310   EOSABS 0.1 03/07/2022 1310   EOSABS 0.1 02/18/2022 1111   BASOSABS 0.0 03/07/2022 1310  BASOSABS 0.0 02/18/2022 1111       Latest Ref Rng & Units 02/17/2022   12:15 PM 02/11/2022    3:38 AM 02/10/2022    4:42 AM  CMP  Glucose 70 - 99 mg/dL 109  109  113   BUN 8 - 27 mg/dL '18  25  28   '$ Creatinine 0.57 - 1.00 mg/dL 1.15  1.11  1.45   Sodium 134 - 144 mmol/L 139  137  137   Potassium 3.5 - 5.2 mmol/L 4.5  3.7  3.6   Chloride 96 - 106 mmol/L 104  106  105   CO2 20 - 29 mmol/L '22  25  24   '$ Calcium 8.7 - 10.3 mg/dL 10.5  9.3  9.2   Total Protein 6.5 - 8.1 g/dL   5.3   Total Bilirubin 0.3 - 1.2 mg/dL   0.4   Alkaline Phos 38 - 126 U/L   69   AST 15 - 41 U/L   13   ALT 0 - 44 U/L   11     DIAGNOSTIC IMAGING:  I have independently reviewed the relevant imaging and discussed with the patient.  ASSESSMENT & PLAN: 1.  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  Normal duodenum and jejunum. - Colonoscopy (12/08/2019): Diverticulosis and polyps - She is on Eliquis 2.5 mg twice daily.  She takes iron tablet twice daily.  *** - She received first dose of IV Feraheme on 03/03/2022 *** - She reports dark black bowel movements.  *** - She is symptomatic with severe fatigue and ice pica.  *** - Hematology work-up (02/24/2022): ***.  Ferritin 11, iron saturation 7%.  Normal folate, B12, MMA, copper. Reticulocytes 3.9% (consistent with acute blood loss), LDH normal, haptoglobin normal, DAT negative SPEP negative for monoclonal protein, but showed elevation in acute phase proteins - CBC today *** - PLAN: Blood transfusion *** - Proceed with second dose of IV Feraheme today.  Repeat CBC and iron panel with RTC in 2 months.  *** - Weekly CBC with possible transfusion x 8 weeks (or until persistently stable) - Continue GI  follow-up.  2.   Unprovoked right leg DVT and PE: - Doppler on 04/03/2019: DVT in the femoral vein and calf veins. - CT chest PE protocol (04/03/2019): Moderate size pulmonary emboli in the right lower lobe. - She was treated with Eliquis full dose, currently dose reduced to 2.5 mg twice daily.  3.  Social/family history: - She lives at home with her older son.  She takes care of the garden and does laundry and her ADLs and IADLs.  But lately her energy levels are low and she is not able to do them.  She was a retired tobacco former.  Never smoked. - Sister had anemia.  Mother had uterine cancer and sister had colon cancer.   PLAN SUMMARY & DISPOSITION: ***  All questions were answered. The patient knows to call the clinic with any problems, questions or concerns.  Medical decision making: ***  Time spent on visit: I spent *** minutes counseling the patient face to face. The total time spent in the appointment was *** minutes and more than 50% was on counseling.  Harriett Rush, PA-C  ***

## 2022-03-14 ENCOUNTER — Inpatient Hospital Stay: Payer: PPO

## 2022-03-14 ENCOUNTER — Inpatient Hospital Stay (HOSPITAL_BASED_OUTPATIENT_CLINIC_OR_DEPARTMENT_OTHER): Payer: PPO | Admitting: Physician Assistant

## 2022-03-14 ENCOUNTER — Encounter (HOSPITAL_COMMUNITY): Payer: Self-pay | Admitting: Internal Medicine

## 2022-03-14 VITALS — BP 151/52 | HR 50 | Temp 97.0°F

## 2022-03-14 VITALS — BP 144/61 | HR 64 | Temp 98.7°F | Resp 18 | Ht 63.0 in | Wt 200.4 lb

## 2022-03-14 DIAGNOSIS — D509 Iron deficiency anemia, unspecified: Secondary | ICD-10-CM

## 2022-03-14 DIAGNOSIS — D500 Iron deficiency anemia secondary to blood loss (chronic): Secondary | ICD-10-CM

## 2022-03-14 LAB — CBC
HCT: 27.6 % — ABNORMAL LOW (ref 36.0–46.0)
Hemoglobin: 8 g/dL — ABNORMAL LOW (ref 12.0–15.0)
MCH: 28.8 pg (ref 26.0–34.0)
MCHC: 29 g/dL — ABNORMAL LOW (ref 30.0–36.0)
MCV: 99.3 fL (ref 80.0–100.0)
Platelets: 265 10*3/uL (ref 150–400)
RBC: 2.78 MIL/uL — ABNORMAL LOW (ref 3.87–5.11)
RDW: 17.4 % — ABNORMAL HIGH (ref 11.5–15.5)
WBC: 4.7 10*3/uL (ref 4.0–10.5)
nRBC: 0 % (ref 0.0–0.2)

## 2022-03-14 LAB — TYPE AND SCREEN
ABO/RH(D): O POS
Antibody Screen: NEGATIVE

## 2022-03-14 MED ORDER — LORATADINE 10 MG PO TABS
10.0000 mg | ORAL_TABLET | Freq: Once | ORAL | Status: AC
  Administered 2022-03-14: 10 mg via ORAL
  Filled 2022-03-14: qty 1

## 2022-03-14 MED ORDER — SODIUM CHLORIDE 0.9 % IV SOLN
510.0000 mg | Freq: Once | INTRAVENOUS | Status: AC
  Administered 2022-03-14: 510 mg via INTRAVENOUS
  Filled 2022-03-14: qty 17

## 2022-03-14 MED ORDER — SODIUM CHLORIDE 0.9 % IV SOLN: Freq: Once | INTRAVENOUS | Status: AC

## 2022-03-14 MED ORDER — ACETAMINOPHEN 325 MG PO TABS
650.0000 mg | ORAL_TABLET | Freq: Once | ORAL | Status: AC
  Administered 2022-03-14: 650 mg via ORAL
  Filled 2022-03-14: qty 2

## 2022-03-14 NOTE — Progress Notes (Unsigned)
Patient presents today for Feraheme infusion and f/u with R. Pennington PA. Patient has no complaints of any changes since her last visit. HGB 8.0 today. No blood products needed today. Vital signs stable. Patient has no complaints of pain.

## 2022-03-14 NOTE — Patient Instructions (Signed)
[facility] [place] **   You were seen today by Tarri Abernethy PA-C for your iron deficiency anemia.    IRON DEFICIENCY ANEMIA: This is from your chronic blood loss from your stomach. After today's visit, you we will have received 2 units of IV iron. We will continue to check your blood every 2 weeks to see if you need blood transfusions. We will recheck your iron levels and see you for an office visit in 6 weeks. Continue iron tablet ONCE daily.   ** Thank you for trusting me with your healthcare!  I strive to provide all of my patients with quality care at each visit.  If you receive a survey for this visit, I would be so grateful to you for taking the time to provide feedback.  Thank you in advance!  ~ Yoneko Talerico                   Dr. Derek Jack   &   Tarri Abernethy, PA-C   - - - - - - - - - - - - - - - - - -    Thank you for choosing [facility] at [facility] to provide your oncology and hematology care.  To afford each patient quality time with our provider, please arrive at least 15 minutes before your scheduled appointment time.   If you have a lab appointment with the [facility] please come in thru the Main Entrance and check in at the main information desk.  You need to re-schedule your appointment should you arrive 10 or more minutes late.  We strive to give you quality time with our providers, and arriving late affects you and other patients whose appointments are after yours.  Also, if you no show three or more times for appointments you may be dismissed from the clinic at the providers discretion.     Again, thank you for choosing [facility].  Our hope is that these requests will decrease the amount of time that you wait before being seen by our physicians.       _____________________________________________________________  Should you have questions  after your visit to [facility], please contact our office at [phone] and follow the prompts.  Our office hours are 8:00 a.m. and 4:30 p.m. Monday - Friday.  Please note that voicemails left after 4:00 p.m. may not be returned until the following business day.  We are closed weekends and major holidays.  You do have access to a nurse 24-7, just call the main number to the clinic [phone] and do not press any options, hold on the line and a nurse will answer the phone.    For prescription refill requests, have your pharmacy contact our office and allow 72 hours.

## 2022-03-15 ENCOUNTER — Ambulatory Visit: Payer: Self-pay | Admitting: *Deleted

## 2022-03-15 ENCOUNTER — Ambulatory Visit: Payer: PPO | Admitting: Physician Assistant

## 2022-03-15 NOTE — Patient Instructions (Signed)
Visit Information  Thank you for taking time to visit with me today. Please don't hesitate to contact me if I can be of assistance to you.   Following are the goals we discussed today:   Goals Addressed               This Visit's Progress     Patient Stated     manage iron deficiency anemia [facility]) (pt-stated)   On track     Start date 03/04/22  Care Coordination Interventions: Counseled on bleeding risk associated with iron deficiency anemia and importance of self-monitoring for signs/symptoms of bleeding Counseled on importance of regular laboratory monitoring as directed by provider Advised to call provider or 911 if active bleeding or signs and symptoms of active bleeding occur encouraged optimal oral intake to support fluid balance and nutrition encouraged dietary changes to increase dietary intake of iron, Vitamin Z79 and folic acid as advised/prescribed Screening for signs and symptoms of depression related to chronic disease state Assessed social determinant of health barriers  encouraged dietary changes to increase dietary intake of iron, vitamin C, constipation management  online education provided via my chart for iron rich diet after discussed better choices of iron rich foods in detail, preventing iron deficiency anemia, atrial fibrillation (article & video) esophageal varices Assessed knowledge and home symptoms related to atrial fibrillation, Reviewed symptoms to monitor (sweating, back) 03/11/22 reviewed recent EGD results, pending further labs 03/15/22 followed up that pt has access to AVS to review online education previously sent. Confirmed improved hemoglobin, anemia         Our next appointment is by telephone on 04/18/22 at 1:30 pm  Please call the care guide team at [phone] if you need to cancel or reschedule your appointment.   If you are experiencing a Mental Health or [place] or need someone to talk to, please call the Suicide and  Crisis Lifeline: 988 call the [place] National Suicide Prevention Lifeline: 959-659-2956 or TTY: (530) 726-6640 TTY (561)540-3285) to talk to a trained counselor call 1-800-273-TALK (toll free, 24 hour hotline) call the [facility]: [phone] call 911   Patient verbalizes understanding of instructions and care plan provided today and agrees to view in [place]. Active MyChart status and patient understanding of how to access instructions and care plan via MyChart confirmed with patient.     The patient has been provided with contact information for the care management team and has been advised to call with any health related questions or concerns.   Dyer Lavina Hamman, RN, BSN, [place] Coordinator Office number [phone]

## 2022-03-15 NOTE — Patient Outreach (Signed)
  Care Coordination   Follow Up Visit Note   03/15/2022 Name: [name] [name] MRN: [id number] DOB: 24-Apr-1941  [name] [name] is a 81 y.o. year old female who sees [name], FNP for primary care. I spoke with  [name] by phone today  What matters to the patients health and wellness today?  Follow up on After visit summary (AVS) education- received, pending complete review, completing iron infusions with improved hemoglobin    Goals Addressed               This Visit's Progress     Patient Stated     manage iron deficiency anemia [facility]) (pt-stated)   On track     Start date 03/04/22  Care Coordination Interventions: Counseled on bleeding risk associated with iron deficiency anemia and importance of self-monitoring for signs/symptoms of bleeding Counseled on importance of regular laboratory monitoring as directed by provider Advised to call provider or 911 if active bleeding or signs and symptoms of active bleeding occur encouraged optimal oral intake to support fluid balance and nutrition encouraged dietary changes to increase dietary intake of iron, Vitamin J28 and folic acid as advised/prescribed Screening for signs and symptoms of depression related to chronic disease state Assessed social determinant of health barriers  encouraged dietary changes to increase dietary intake of iron, vitamin C, constipation management  online education provided via my chart for iron rich diet after discussed better choices of iron rich foods in detail, preventing iron deficiency anemia, atrial fibrillation (article & video) esophageal varices Assessed knowledge and home symptoms related to atrial fibrillation, Reviewed symptoms to monitor (sweating, back) 03/11/22 reviewed recent EGD results, pending further labs 03/15/22 followed up that pt has access to AVS to review online education previously sent. Confirmed improved hemoglobin, anemia         SDOH assessments and  interventions completed:  Yes  SDOH Interventions Today    Flowsheet Row Most Recent Value  SDOH Interventions   Food Insecurity Interventions Intervention Not Indicated  Transportation Interventions Intervention Not Indicated        Care Coordination Interventions Activated:  Yes  Care Coordination Interventions:  Yes, provided   Follow up plan: Follow up call scheduled for 04/18/22    Encounter Outcome:  Pt. Visit Completed   Sariya Trickey L. Lavina Hamman, RN, BSN, [place] Coordinator Office number [phone]

## 2022-03-18 ENCOUNTER — Ambulatory Visit (INDEPENDENT_AMBULATORY_CARE_PROVIDER_SITE_OTHER): Payer: PPO | Admitting: Pharmacist

## 2022-03-18 DIAGNOSIS — E785 Hyperlipidemia, unspecified: Secondary | ICD-10-CM

## 2022-03-18 DIAGNOSIS — N1831 Chronic kidney disease, stage 3a: Secondary | ICD-10-CM

## 2022-03-23 MED ORDER — DAPAGLIFLOZIN PROPANEDIOL 10 MG PO TABS: 10.0000 mg | ORAL_TABLET | Freq: Every day | ORAL | 11 refills | Status: DC

## 2022-03-23 NOTE — Progress Notes (Signed)
Chronic Care Management Pharmacy Note  03/18/2022 Name:  [name] [name] MRN:  [id number] DOB:  Jun 15, 1941  Summary:  Atrial Fibrillation: Controlled; current rate/rhythm control: METOP; anticoagulant treatment: ELIQUIS PATIENT DENIES SIGNS & SYMPTOMS OF BLEEDING--TOLERATING WELL UNABLE TO AFFORD HIGH COPAY OF ELIQUIS CBC    Component Value Date/Time   WBC 4.7 03/14/2022 1051   RBC 2.78 (L) 03/14/2022 1051   HGB 8.0 (L) 03/14/2022 1051   HGB 7.3 (LL) 02/18/2022 1111   HCT 27.6 (L) 03/14/2022 1051   HCT 23.3 (L) 02/18/2022 1111   PLT 265 03/14/2022 1051   PLT 430 02/18/2022 1111   MCV 99.3 03/14/2022 1051   MCV 90 02/18/2022 1111   MCH 28.8 03/14/2022 1051   MCHC 29.0 (L) 03/14/2022 1051   RDW 17.4 (H) 03/14/2022 1051   RDW 13.7 02/18/2022 1111   LYMPHSABS 1.3 03/07/2022 1310   LYMPHSABS 0.8 02/18/2022 1111   MONOABS 0.5 03/07/2022 1310   EOSABS 0.1 03/07/2022 1310   EOSABS 0.1 02/18/2022 1111   BASOSABS 0.0 03/07/2022 1310   BASOSABS 0.0 02/18/2022 1111   CHADS2VASc score: 6 Home blood pressure, heart rate readings: 120-130s/70-80s Educated on ELIQUIS-PURPOSE AND SIDE EFFECTS Assessed patient finances. Will attempt to get eliquis covered under patient assistance via [place]; patient to provide income documents & out of pocket spend today--samples left up front for patient  CKD -GFR48-CKD stage 3a, uacr 79 last fall-will repeat -currently on farxiga 65m daily -will apply for PAP due to cost VIA [employer] PATIENT ASSISTANCE PROGRAM  Patient Goals/Self-Care Activities Over the next 90 days, patient will:  - take medications as prescribed  Follow Up Plan: Telephone follow up appointment with care management team member scheduled for: 1 month   Subjective: GMargarete Horaceis an 81y.o. year old female who is a primary patient of MGwenlyn Perking FSolen  The CCM team was consulted for assistance with disease management and care coordination  needs.    Engaged with patient by telephone for initial visit in response to provider referral for pharmacy case management and/or care coordination services.   Consent to Services:  The patient was given information about Chronic Care Management services, agreed to services, and gave verbal consent prior to initiation of services.  Please see initial visit note for detailed documentation.   Patient Care Team: MGwenlyn Perking FNP as PCP - General (Family Medicine) HMinus Breeding MD as PCP - Cardiology (Cardiology) BAnnitta Needs NP (Gastroenterology) HMinus Breeding MD as Consulting Physician (Cardiology) JCelestia Khat OBrewerton(Optometry) PBlanca FriendJRoyce Macadamia RInd[facility] Hospitalas Pharmacist (Family Medicine) LLendon Colonel NP as Nurse Practitioner (Cardiology) [facility] IMelanie Crazier MD as Attending Physician (Endocrinology) MClarene Essex MD as Consulting Physician (Gastroenterology) GJanith Lima MD as Consulting Physician (Urology) GBarbaraann Faster RN as TDorranceManagement KDerek Jack MD as Consulting Physician (Hematology)  Objective:  Lab Results  Component Value Date   CREATININE 1.15 (H) 02/17/2022   CREATININE 1.11 (H) 02/11/2022   CREATININE 1.45 (H) 02/10/2022    Lab Results  Component Value Date   HGBA1C 5.4 10/28/2021   Last diabetic Eye exam: No results found for: "HMDIABEYEEXA"  Last diabetic Foot exam: No results found for: "HMDIABFOOTEX"      Component Value Date/Time   CHOL 168 10/28/2021 0813   TRIG 127 10/28/2021 0813   HDL 56 10/28/2021 0813   CHOLHDL 3.0 10/28/2021 0813   CHOLHDL 3.7 02/07/2021 0528   VLDL 13 02/07/2021  0528   LDLCALC 90 10/28/2021 0813       Latest Ref Rng & Units 02/10/2022    4:42 AM 02/09/2022    8:13 PM 02/02/2022   10:45 AM  Hepatic Function  Total Protein 6.5 - 8.1 g/dL 5.3  5.9  6.7   Albumin 3.5 - 5.0 g/dL 2.9  3.4  4.6   AST 15 - 41 U/L 13  15  12    ALT 0 - 44 U/L 11  13  9    Alk  Phosphatase 38 - 126 U/L 69  80  109   Total Bilirubin 0.3 - 1.2 mg/dL 0.4  0.6  0.2     Lab Results  Component Value Date/Time   TSH 0.984 02/10/2022 04:42 AM   TSH 2.530 07/30/2021 01:06 PM   TSH 3.605 02/07/2021 05:28 AM   TSH 2.850 10/20/2020 10:17 AM   FREET4 1.58 07/30/2021 01:06 PM   FREET4 1.42 07/21/2020 03:48 PM       Latest Ref Rng & Units 03/14/2022   10:51 AM 03/07/2022    1:10 PM 03/02/2022    1:42 PM  CBC  WBC 4.0 - 10.5 K/uL 4.7  5.4  7.7   Hemoglobin 12.0 - 15.0 g/dL 8.0  7.8  6.6   Hematocrit 36.0 - 46.0 % 27.6  26.8  22.4   Platelets 150 - 400 K/uL 265  353  476     Lab Results  Component Value Date/Time   VD25OH 70.01 10/04/2021 10:19 AM    Clinical ASCVD: No  The ASCVD Risk score (Arnett DK, et al., 2019) failed to calculate for the following reasons:   The 2019 ASCVD risk score is only valid for ages 80 to 80    Other: (CHADS2VASc if Afib, PHQ9 if depression, MMRC or CAT for COPD, ACT, DEXA)  Social History   Tobacco Use  Smoking Status Never  Smokeless Tobacco Never   BP Readings from Last 3 Encounters:  03/14/22 (!) 144/61  03/14/22 (!) 151/52  03/10/22 (!) 101/49   Pulse Readings from Last 3 Encounters:  03/14/22 64  03/14/22 (!) 50  03/10/22 77   Wt Readings from Last 3 Encounters:  03/14/22 200 lb 6.4 oz (90.9 kg)  03/10/22 200 lb (90.7 kg)  03/07/22 201 lb 3.2 oz (91.3 kg)    Assessment: Review of patient past medical history, allergies, medications, health status, including review of consultants reports, laboratory and other test data, was performed as part of comprehensive evaluation and provision of chronic care management services.   SDOH:  (Social Determinants of Health) assessments and interventions performed:    CCM Care Plan  No Known Allergies  Medications Reviewed Today     Reviewed by Lavera Guise, [facility] (Pharmacist) on 03/23/22 at 1155  Med List Status: <None>   Medication Order Taking? Sig Documenting  Provider Last Dose Status Informant  acetaminophen (TYLENOL) 325 MG tablet [id number] No Take 2 tablets (650 mg total) by mouth every 6 (six) hours as needed for mild pain or headache (or Fever >/= 101). Roxan Hockey, MD Taking Active Self  albuterol (VENTOLIN HFA) 108 (90 Base) MCG/ACT inhaler [id number] No Inhale 2 puffs into the lungs every 6 (six) hours as needed for wheezing or shortness of breath.  Patient not taking: Reported on 03/14/2022   Barton Dubois, MD Not Taking Active Self           Med Note Nat Christen   Tue Mar 08, 2022 10:26 AM) Has  not used since hospital discharge but still has on hand  allopurinol (ZYLOPRIM) 100 MG tablet [id number] No Take 1 tablet (100 mg total) by mouth daily. [name], FNP Taking Active Self  amLODipine (NORVASC) 2.5 MG tablet [id number] No Take 1 tablet (2.5 mg total) by mouth daily. Barton Dubois, MD Taking Active Self  apixaban (ELIQUIS) 2.5 MG TABS tablet [id number] No Take 1 tablet (2.5 mg total) by mouth 2 (two) times daily. Loman Brooklyn, FNP Taking Active Self  atorvastatin (LIPITOR) 20 MG tablet [id number] No Take 1 tablet (20 mg total) by mouth daily. Loman Brooklyn, FNP Taking Active Self  dapagliflozin propanediol (FARXIGA) 10 MG TABS tablet [id number]  Take 1 tablet (10 mg total) by mouth daily. [name], FNP  Active   docusate sodium (COLACE) 100 MG capsule [id number] No Take 1 capsule (100 mg total) by mouth 2 (two) times daily.  Patient taking differently: Take 100 mg by mouth daily as needed for moderate constipation.   Barton Dubois, MD Taking Active Self  furosemide (LASIX) 20 MG tablet [id number] No Take 1 tablet (20 mg total) by mouth daily. Minus Breeding, MD Taking Active Self  GNP CRANBERRY EXTRACT PO [id number] No Take 500 mg by mouth in the morning. [provider] Taking Active Self  levothyroxine (SYNTHROID) 50 MCG tablet [id number] No TAKE 1 TABLET BY MOUTH ONCE DAILY BEFORE BREAKFAST [name] F, FNP Taking Active Self  loratadine (CLARITIN) 10 MG tablet [id number] No Take 10 mg by mouth daily. [provider] Taking Active Self  oxybutynin (DITROPAN-XL) 10 MG 24 hr tablet [id number] No Take 1 tablet (10 mg total) by mouth at bedtime. [name], FNP Taking Active Self  pantoprazole (PROTONIX) 40 MG tablet [id number] No Take 1 tablet (40 mg total) by mouth 2 (two) times daily before a meal. [name], FNP Taking Active Self  polyethylene glycol (MIRALAX / GLYCOLAX) 17 g packet [id number] No Take 17 g by mouth daily as needed for mild constipation.  Patient taking differently: Take 17 g by mouth daily.   Barton Dubois, MD Taking Active Self  vitamin B-12 (CYANOCOBALAMIN) 50 MCG tablet [id number] No Take 50 mcg by mouth daily. [provider] Taking Active Self  Vitamin D3 (VITAMIN D) 25 MCG tablet [id number] No Take 1,000 Units by mouth daily. [provider] Taking Active Self            Patient Active Problem List   Diagnosis Date Noted   Nausea without vomiting 03/07/2022   Severe sepsis ([place]) 02/09/2022   Hypokalemia 02/09/2022   Acute respiratory failure with hypoxia ([place]) 02/09/2022   Hypoalbuminemia 02/09/2022   CAP (community acquired pneumonia) 02/09/2022   Primary hyperparathyroidism ([place]) 10/30/2021   Prediabetes 10/30/2021   MR (congenital mitral regurgitation) 08/01/2021   Stage 3a chronic kidney disease ([place]) 07/30/2021   Nonrheumatic tricuspid valve regurgitation 02/24/2021   Nonrheumatic mitral valve regurgitation    Chronic a-fib ([place]) 02/04/2021   Iron deficiency anemia    Sciatica of right side 10/18/2018   Seborrheic keratosis 10/17/2016   Dyslipidemia 10/17/2016   Overactive bladder 10/17/2016   Gastroesophageal reflux disease without esophagitis 10/17/2016   Primary osteoarthritis involving multiple joints 10/17/2016   Controlled gout 10/17/2016   Essential hypertension 05/16/2016   Acquired  hypothyroidism 05/16/2016   Morbid obesity ([place]) 10/30/2013   S/P right THA, AA 10/29/2013    Immunization History  Administered Date(s) Administered   Fluad Quad(high Dose 65+) 04/04/2019, 06/04/2020, 05/10/2021  Influenza, High Dose Seasonal PF 05/16/2016, 08/01/2017, 08/28/2018   Moderna SARS-COV2 Booster Vaccination 07/02/2020   Moderna Sars-Covid-2 Vaccination 09/18/2019, 10/16/2019   PNEUMOCOCCAL CONJUGATE-20 07/30/2021   Pneumococcal Conjugate-13 07/02/2020   Zoster Recombinat (Shingrix) 12/03/2019, 10/20/2020    Conditions to be addressed/monitored: DMII and CKD Stage 3A  Care Plan : PHARMD MEDICATION MANAGEMENT  Updates made by Lavera Guise, [place] since 03/25/2022 12:00 AM     Problem: DISEASE PROGRESSION PREVENTION      Long-Range Goal: ATRIAL FIBRILLATION, CKD   Recent Progress: On track  Priority: High  Note:   Current Barriers:  Unable to independently afford treatment regimen Does not adhere to prescribed medication regimen-DUE TO COST  Pharmacist Clinical Goal(s):  Over the next 90 days, patient will verbalize ability to afford treatment regimen adhere to prescribed medication regimen as evidenced by [address]  through collaboration with PharmD and provider.    Interventions: 1:1 collaboration with Loman Brooklyn, FNP regarding development and update of comprehensive plan of care as evidenced by provider attestation and co-signature Inter-disciplinary care team collaboration (see longitudinal plan of care) Comprehensive medication review performed; medication list updated in electronic medical record  Atrial Fibrillation: Controlled; current rate/rhythm control: METOP; anticoagulant treatment: ELIQUIS PATIENT DENIES SIGNS & SYMPTOMS OF BLEEDING--TOLERATING WELL UNABLE TO AFFORD HIGH COPAY OF ELIQUIS CBC    Component Value Date/Time   WBC 4.7 03/14/2022 1051   RBC 2.78 (L) 03/14/2022 1051   HGB 8.0 (L) 03/14/2022 1051   HGB  7.3 (LL) 02/18/2022 1111   HCT 27.6 (L) 03/14/2022 1051   HCT 23.3 (L) 02/18/2022 1111   PLT 265 03/14/2022 1051   PLT 430 02/18/2022 1111   MCV 99.3 03/14/2022 1051   MCV 90 02/18/2022 1111   MCH 28.8 03/14/2022 1051   MCHC 29.0 (L) 03/14/2022 1051   RDW 17.4 (H) 03/14/2022 1051   RDW 13.7 02/18/2022 1111   LYMPHSABS 1.3 03/07/2022 1310   LYMPHSABS 0.8 02/18/2022 1111   MONOABS 0.5 03/07/2022 1310   EOSABS 0.1 03/07/2022 1310   EOSABS 0.1 02/18/2022 1111   BASOSABS 0.0 03/07/2022 1310   BASOSABS 0.0 02/18/2022 1111   CHADS2VASc score: 6 Home blood pressure, heart rate readings: 120-130s/70-80s Educated on ELIQUIS-PURPOSE AND SIDE EFFECTS Assessed patient finances. Will attempt to get eliquis covered under patient assistance via [place]; patient to provide income documents & out of pocket spend today--samples left up front for patient  CKD -GFR48-CKD stage 3a, uacr 79 last fall-will repeat -currently on farxiga 68m daily -will apply for PAP due to cost VIA [place] PATIENT ASSISTANCE PROGRAM  Patient Goals/Self-Care Activities Over the next 90 days, patient will:  - take medications as prescribed  Follow Up Plan: Telephone follow up appointment with care management team member scheduled for: 1 month     Plan: Telephone follow up appointment with care management team member scheduled for:  3 MONTHS   JRegina Eck PharmD, BCPS Clinical Pharmacist, [place] Phone [phone]

## 2022-03-23 NOTE — Patient Instructions (Addendum)
Visit Information  Following are the goals we discussed today:  Current Barriers:  Unable to independently afford treatment regimen Does not adhere to prescribed medication regimen-DUE TO COST  Pharmacist Clinical Goal(s):  Over the next 90 days, patient will verbalize ability to afford treatment regimen adhere to prescribed medication regimen as evidenced by [place] through collaboration with PharmD and provider.    Interventions: 1:1 collaboration with Loman Brooklyn, FNP regarding development and update of comprehensive plan of care as evidenced by provider attestation and co-signature Inter-disciplinary care team collaboration (see longitudinal plan of care) Comprehensive medication review performed; medication list updated in electronic medical record  Atrial Fibrillation: Controlled; current rate/rhythm control: METOP; anticoagulant treatment: ELIQUIS PATIENT DENIES SIGNS & SYMPTOMS OF BLEEDING--TOLERATING WELL UNABLE TO AFFORD HIGH COPAY OF ELIQUIS CBC    Component Value Date/Time   WBC 4.7 03/14/2022 1051   RBC 2.78 (L) 03/14/2022 1051   HGB 8.0 (L) 03/14/2022 1051   HGB 7.3 (LL) 02/18/2022 1111   HCT 27.6 (L) 03/14/2022 1051   HCT 23.3 (L) 02/18/2022 1111   PLT 265 03/14/2022 1051   PLT 430 02/18/2022 1111   MCV 99.3 03/14/2022 1051   MCV 90 02/18/2022 1111   MCH 28.8 03/14/2022 1051   MCHC 29.0 (L) 03/14/2022 1051   RDW 17.4 (H) 03/14/2022 1051   RDW 13.7 02/18/2022 1111   LYMPHSABS 1.3 03/07/2022 1310   LYMPHSABS 0.8 02/18/2022 1111   MONOABS 0.5 03/07/2022 1310   EOSABS 0.1 03/07/2022 1310   EOSABS 0.1 02/18/2022 1111   BASOSABS 0.0 03/07/2022 1310   BASOSABS 0.0 02/18/2022 1111   CHADS2VASc score: 6 Home blood pressure, heart rate readings: 120-130s/70-80s Educated on ELIQUIS-PURPOSE AND SIDE EFFECTS Assessed patient finances. Will attempt to get eliquis covered under patient assistance via [place]; patient to  provide income documents & out of pocket spend today--samples left up front for patient  CKD -GFR48-CKD stage 3a, uacr 79 last fall-will repeat -currently on farxiga '10mg'$  daily -will apply for PAP due to cost VIA [place]  Patient Goals/Self-Care Activities Over the next 90 days, patient will:  - take medications as prescribed  Follow Up Plan: Telephone follow up appointment with care management team member scheduled for: 1 month   Plan: Telephone follow up appointment with care management team member scheduled for:  6 MONTHS  Signature Regina Eck, PharmD, BCPS Clinical Pharmacist, [place]  II Phone [phone]   Please call the care guide team at [phone] if you need to cancel or reschedule your appointment.   The patient verbalized understanding of instructions, educational materials, and care plan provided today and DECLINED offer to receive copy of patient instructions, educational materials, and care plan.

## 2022-03-24 DIAGNOSIS — E785 Hyperlipidemia, unspecified: Secondary | ICD-10-CM

## 2022-03-24 DIAGNOSIS — N1831 Chronic kidney disease, stage 3a: Secondary | ICD-10-CM

## 2022-03-29 ENCOUNTER — Other Ambulatory Visit (HOSPITAL_COMMUNITY): Payer: PPO

## 2022-03-29 ENCOUNTER — Ambulatory Visit (INDEPENDENT_AMBULATORY_CARE_PROVIDER_SITE_OTHER): Payer: PPO

## 2022-03-29 DIAGNOSIS — Q233 Congenital mitral insufficiency: Secondary | ICD-10-CM | POA: Diagnosis not present

## 2022-03-30 LAB — ECHOCARDIOGRAM COMPLETE
Area-P 1/2: 2.8 cm2
MV M vel: 6.21 m/s
MV Peak grad: 154.3 mmHg
Radius: 0.4 cm
S' Lateral: 3.49 cm

## 2022-03-31 ENCOUNTER — Inpatient Hospital Stay: Payer: PPO | Attending: Hematology

## 2022-03-31 DIAGNOSIS — D500 Iron deficiency anemia secondary to blood loss (chronic): Secondary | ICD-10-CM | POA: Diagnosis not present

## 2022-03-31 LAB — CBC
HCT: 30.4 % — ABNORMAL LOW (ref 36.0–46.0)
Hemoglobin: 8.9 g/dL — ABNORMAL LOW (ref 12.0–15.0)
MCH: 30.1 pg (ref 26.0–34.0)
MCHC: 29.3 g/dL — ABNORMAL LOW (ref 30.0–36.0)
MCV: 102.7 fL — ABNORMAL HIGH (ref 80.0–100.0)
Platelets: 235 10*3/uL (ref 150–400)
RBC: 2.96 MIL/uL — ABNORMAL LOW (ref 3.87–5.11)
RDW: 17.3 % — ABNORMAL HIGH (ref 11.5–15.5)
WBC: 4.1 10*3/uL (ref 4.0–10.5)
nRBC: 0 % (ref 0.0–0.2)

## 2022-03-31 LAB — SAMPLE TO BLOOD BANK

## 2022-04-01 ENCOUNTER — Inpatient Hospital Stay: Payer: PPO

## 2022-04-07 ENCOUNTER — Telehealth: Payer: Self-pay | Admitting: Family Medicine

## 2022-04-07 NOTE — Telephone Encounter (Signed)
Patient calling to speak with Almyra Free about patient assistance program. Please call back.

## 2022-04-08 NOTE — Telephone Encounter (Signed)
No rush--can you reach out to patient?

## 2022-04-13 ENCOUNTER — Ambulatory Visit: Payer: PPO | Admitting: Gastroenterology

## 2022-04-14 ENCOUNTER — Inpatient Hospital Stay: Payer: PPO

## 2022-04-14 DIAGNOSIS — D500 Iron deficiency anemia secondary to blood loss (chronic): Secondary | ICD-10-CM | POA: Diagnosis not present

## 2022-04-14 LAB — CBC
HCT: 32.8 % — ABNORMAL LOW (ref 36.0–46.0)
Hemoglobin: 9.7 g/dL — ABNORMAL LOW (ref 12.0–15.0)
MCH: 29.9 pg (ref 26.0–34.0)
MCHC: 29.6 g/dL — ABNORMAL LOW (ref 30.0–36.0)
MCV: 101.2 fL — ABNORMAL HIGH (ref 80.0–100.0)
Platelets: 224 10*3/uL (ref 150–400)
RBC: 3.24 MIL/uL — ABNORMAL LOW (ref 3.87–5.11)
RDW: 15.4 % (ref 11.5–15.5)
WBC: 4.3 10*3/uL (ref 4.0–10.5)
nRBC: 0 % (ref 0.0–0.2)

## 2022-04-14 LAB — SAMPLE TO BLOOD BANK

## 2022-04-14 NOTE — Telephone Encounter (Signed)
Left message requesting call back regarding patient assistance. Call back [phone].

## 2022-04-14 NOTE — Telephone Encounter (Signed)
Pt returned phone call regarding letter she rec'd from [employer].  Pt is currently denied due to not meeting oop costs. Pt says she should have spent more than whats listed on forms she provided from June statement (spent $300 in July on eliquis). Pt will look for her August medication costs statement from insurance and bring it by office for [place] to resubmit.

## 2022-04-15 ENCOUNTER — Inpatient Hospital Stay: Payer: PPO

## 2022-04-18 ENCOUNTER — Encounter: Payer: Self-pay | Admitting: *Deleted

## 2022-04-18 ENCOUNTER — Ambulatory Visit: Payer: Self-pay | Admitting: *Deleted

## 2022-04-18 NOTE — Patient Outreach (Signed)
  Care Coordination   Follow Up Visit Note   04/18/2022 Name: [name] [name] MRN: [id number] DOB: [birth year]  [name] [name] is a 81 y.o. year old female who sees [name], FNP for primary care. I spoke with  [name] by phone today.  What matters to the patients health and wellness today?  Reviewed needs with Eliquis. She spoke with Roosvelt Harps on 04/18/22. She has mailed documentation of her out of 2023 pocket expenses   Message left for her today report she is approved for [place] but when she called back she disconnected as she was ask for more confidential information   Anemia- Hemoglobin has increased to 9 She feels more energetic Follows up with her oncology staff, Dr Leron Croak  Gastric-  no further dark stools or frank   Respiratory States she only get short of breath with sweeping Denies any present worsening symptoms. She does not go out a lot.   Cardiac denies worsening symptoms   Goals Addressed               This Visit's Progress     Patient Stated     manage atrial fibrillation [facility]) (pt-stated)   On track     Care Coordination Interventions: 03/11/22 Reviewed importance of adherence to anticoagulant exactly as prescribed Counseled on importance of regular laboratory monitoring as prescribed Afib action plan reviewed Assessed social determinant of health barriers Discussed pharmacy updates for Eliquis and Farxiga.  Monitoring for Eliquis medicine assist from [place] staff. Encouraged patient to outreach also if [employer] does not outreach to her Will outreach to cardiologist as needed       manage iron deficiency anemia [facility]) (pt-stated)   On track     Start date 03/04/22  Care Coordination Interventions: Counseled on bleeding risk associated with iron deficiency anemia and importance of self-monitoring for signs/symptoms of bleeding Counseled on importance of regular laboratory monitoring as directed by provider Advised  to call provider or 911 if active bleeding or signs and symptoms of active bleeding occur Screening for signs and symptoms of depression related to chronic disease state Assessed social determinant of health barriers   Assessed for worsening symptoms of respiratory, gastric, cardiac and anemia symptoms. Confirms she feels better without any worsening symptoms, more energetic and her hemoglobin is now 9. Commended her on her improvements and home monitoring        SDOH assessments and interventions completed:  No  SDOH Interventions Today    Flowsheet Row Most Recent Value  SDOH Interventions   Food Insecurity Interventions Intervention Not Indicated  Housing Interventions Intervention Not Indicated  Transportation Interventions Intervention Not Indicated  Utilities Interventions Intervention Not Indicated        Care Coordination Interventions Activated:  Yes  Care Coordination Interventions:  Yes, provided   Follow up plan: Follow up call scheduled for 05/17/22 1:30 pm    Encounter Outcome:  Pt. Visit Completed   Shayana Hornstein L. Lavina Hamman, RN, BSN, [place] Coordinator Office number [phone]

## 2022-04-18 NOTE — Patient Instructions (Signed)
Visit Information  Thank you for taking time to visit with me today. Please don't hesitate to contact me if I can be of assistance to you.   Following are the goals we discussed today:   Goals Addressed               This Visit's Progress     Patient Stated     manage atrial fibrillation [facility]) (pt-stated)   On track     Care Coordination Interventions: 03/11/22 Reviewed importance of adherence to anticoagulant exactly as prescribed Counseled on importance of regular laboratory monitoring as prescribed Afib action plan reviewed Assessed social determinant of health barriers Discussed pharmacy updates for Eliquis and Farxiga.  Monitoring for Eliquis medicine assist from [place] staff. Encouraged patient to outreach also if [employer] does not outreach to her Will outreach to cardiologist as needed       manage iron deficiency anemia [facility]) (pt-stated)   On track     Start date 03/04/22  Care Coordination Interventions: Counseled on bleeding risk associated with iron deficiency anemia and importance of self-monitoring for signs/symptoms of bleeding Counseled on importance of regular laboratory monitoring as directed by provider Advised to call provider or 911 if active bleeding or signs and symptoms of active bleeding occur Screening for signs and symptoms of depression related to chronic disease state Assessed social determinant of health barriers   Assessed for worsening symptoms of respiratory, gastric, cardiac and anemia symptoms. Confirms she feels better without any worsening symptoms, more energetic and her hemoglobin is now 9. Commended her on her improvements and home monitoring        Our next appointment is by telephone on 05/17/22 at 1:30 pm  Please call the care guide team at [phone] if you need to cancel or reschedule your appointment.   If you are experiencing a Mental Health or [place] or need someone to talk to, please call the  Suicide and Crisis Lifeline: 988 call the [place] National Suicide Prevention Lifeline: 661-435-8215 or TTY: 778-048-3819 TTY 650-590-3235) to talk to a trained counselor call 1-800-273-TALK (toll free, 24 hour hotline) call the [facility]: 571-773-5223 call 911   Patient verbalizes understanding of instructions and care plan provided today and agrees to view in [place]. Active MyChart status and patient understanding of how to access instructions and care plan via MyChart confirmed with patient.     The patient has been provided with contact information for the care management team and has been advised to call with any health related questions or concerns.   [place] Lavina Hamman, RN, BSN, [place] Coordinator Office number [phone]

## 2022-04-19 ENCOUNTER — Other Ambulatory Visit: Payer: Self-pay | Admitting: Family Medicine

## 2022-04-19 DIAGNOSIS — E785 Hyperlipidemia, unspecified: Secondary | ICD-10-CM

## 2022-04-21 ENCOUNTER — Telehealth: Payer: Self-pay

## 2022-04-21 NOTE — Telephone Encounter (Signed)
Received notification from [employer] regarding approval for [facility]. Patient assistance approved from 04/18/22 to 07/24/22.  Phone: [phone]

## 2022-04-27 NOTE — Progress Notes (Unsigned)
[place],  [id number]   CLINIC:  Medical Oncology/Hematology  PCP:  [name], [place] [address] [phone]   REASON FOR VISIT:  Follow-up for iron deficiency anemia  PRIOR THERAPY: Blood transfusions and oral iron tablets  CURRENT THERAPY: Intermittent IV iron  INTERVAL HISTORY:  [name] [name] 81 y.o. female returns for routine follow-up of her iron deficiency anemia.  She was last seen by Tarri Abernethy PA-C on 03/14/2022.  She received blood transfusions, most recently on 02/24/2022 (Hgb 6.7) and again on 03/03/2022 (Hgb 6.6).  She received F Feraheme x2 on 03/03/2022 and 03/14/2022.  She reports improved energy and strength after her iron infusions and blood transfusions.   She had EGD with APC treatment of GAVE) on 03/10/2022, and reports that she has not had as many black stools following her EGD.  Energy is improved to 50%, not yet at baseline. Mild ice pica.  Her dyspnea on exertion is persistent but improved.  No restless legs, headaches, additional exertion, lightheadedness, or syncope.  No recent chest pain.  She is taking iron tablet once daily with mild constipation.  She remains on Eliquis 2.5 mg BID for history of unprovoked DVT and PE in 2020.    She has 50% energy and 100% appetite. She endorses that she is maintaining a stable weight.   REVIEW OF SYSTEMS:  Review of Systems  Constitutional:  Positive for fatigue. Negative for appetite change, chills, diaphoresis, fever and unexpected weight change.  HENT:   Negative for lump/mass and nosebleeds.   Eyes:  Negative for eye problems.  Respiratory:  Positive for shortness of breath (with exertion). Negative for cough and hemoptysis.   Cardiovascular:  Negative for chest pain, leg swelling and palpitations.  Gastrointestinal:  Negative for abdominal pain, blood in stool, constipation, diarrhea, nausea and vomiting.  Genitourinary:  Negative for  hematuria.   Skin: Negative.   Neurological:  Negative for dizziness, headaches and light-headedness.  Hematological:  Does not bruise/bleed easily.      PAST MEDICAL/SURGICAL HISTORY:  Past Medical History:  Diagnosis Date   Anemia    Arthritis    Ostearthritis- hips, knees, fingers   Basal cell carcinoma (BCC) of right hand 10/2021   Dx by [facility]   DVT (deep venous thrombosis) (HCC)    Dyspnea    GERD (gastroesophageal reflux disease)    Headache(784.0)    tx. Valproic acid   History of diabetes mellitus    History of kidney stones    Hypertension    Hypothyroidism    Pulmonary embolism [facility])    Past Surgical History:  Procedure Laterality Date   ABDOMINAL HYSTERECTOMY     BILATERAL HIP ARTHROSCOPY Left    BIOPSY  12/08/2019   Procedure: BIOPSY;  Surgeon: Daneil Dolin, MD;  [facility];  Service: Endoscopy;;   CATARACT EXTRACTION, BILATERAL     COLONOSCOPY N/A 12/08/2019   polyps (tubular adenoma), diverticulosis, colonic lipoma, no surveillance due to age   44 W/ [place] Right 12/25/2020   Procedure: CYSTOSCOPY WITH RETROGRADE PYELOGRAM/URETERAL STENT PLACEMENT;  Surgeon: Janith Lima, MD;  Location: [facility];  Service: Urology;  Laterality: Right;   CYSTOSCOPY/URETEROSCOPY/HOLMIUM LASER/STENT PLACEMENT Right 01/15/2021   Procedure: CYSTOSCOPY/RETROGRADE/URETEROSCOPY/HOLMIUM LASER/STENT EXCHANGE;  Surgeon: Janith Lima, MD;  Location: [facility];  Service: Urology;  Laterality: Right;   ESOPHAGOGASTRODUODENOSCOPY N/A 12/08/2019   normal esophagus with possibly early GAVE, normal  duodenum, gastric biopsy: negative H.pylori.   ESOPHAGOGASTRODUODENOSCOPY (EGD) WITH PROPOFOL N/A 05/18/2021   Surgeon: Clarene Essex, MD;   Tiny hiatal hernia, gastric antral vascular ectasia s/p ablation, normal duodenum, normal jejunum.   ESOPHAGOGASTRODUODENOSCOPY (EGD) WITH PROPOFOL N/A 03/10/2022   Procedure: ESOPHAGOGASTRODUODENOSCOPY (EGD) WITH  PROPOFOL;  Surgeon: Eloise Harman, DO;  [facility];  Service: Endoscopy;  Laterality: N/A;  10:45am   GIVENS CAPSULE STUDY N/A 01/13/2020   Procedure: GIVENS CAPSULE STUDY;  Surgeon: Daneil Dolin, MD;  [facility];  Service: Endoscopy;  Laterality: N/A;  7:30am   GIVENS CAPSULE STUDY N/A 05/04/2021   Procedure: GIVENS CAPSULE STUDY;  Surgeon: Clarene Essex, MD;  [facility];  Service: Endoscopy;  Laterality: N/A;   MULTIPLE TOOTH EXTRACTIONS     60's   PARATHYROIDECTOMY     POLYPECTOMY  12/08/2019   Procedure: POLYPECTOMY;  Surgeon: Daneil Dolin, MD;  [facility];  Service: Endoscopy;;   RADIOFREQUENCY ABLATION  05/18/2021   Procedure: RADIO FREQUENCY ABLATION;  Surgeon: Clarene Essex, MD;  [facility];  Service: Endoscopy;;   TEE WITHOUT CARDIOVERSION N/A 07/06/2021   Procedure: TRANSESOPHAGEAL ECHOCARDIOGRAM (TEE);  Surgeon: Donato Heinz, MD;  Location: [facility] ENDOSCOPY;  Service: Cardiovascular;  Laterality: N/A;   TOTAL HIP ARTHROPLASTY Right 10/29/2013   Procedure: RIGHT TOTAL HIP ARTHROPLASTY ANTERIOR APPROACH;  Surgeon: Mauri Pole, MD;  Location: [facility];  Service: Orthopedics;  Laterality: Right;     SOCIAL HISTORY:  Social History   Socioeconomic History   Marital status: Widowed    Spouse name: Not on file   Number of children: 4   Years of education: 12   Highest education level: High school graduate  Occupational History   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 She has four adult children and one son lives with her.        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   Social Determinants of Health   Financial Resource Strain: High Risk (03/11/2022)   Overall Financial Resource Strain (CARDIA)    Difficulty of Paying Living Expenses: Very hard  Food Insecurity: No Food Insecurity (04/18/2022)   Hunger Vital Sign    Worried About Running Out of Food in the Last Year: Never true    Ran Out of Food in the Last Year: Never true  Transportation Needs: No Transportation Needs (04/18/2022)   PRAPARE - Hydrologist (Medical): No    Lack of Transportation (Non-Medical): No  Physical Activity: Insufficiently Active (12/23/2021)   Exercise Vital Sign    Days of Exercise per Week: 7 days    Minutes of Exercise per Session: 20 min  Stress: No Stress Concern Present (03/04/2022)   [place]    Feeling of Stress : Only a little  Social Connections: Moderately Integrated (12/23/2021)   Social Connection and Isolation Panel [NHANES]    Frequency of Communication with Friends and Family: More than three  times a week    Frequency of Social Gatherings with Friends and Family: More than three times a week    Attends Religious Services: More than 4 times per year    Active Member of Clubs or Organizations: Yes    Attends Archivist Meetings: More than 4 times per year    Marital Status: Widowed  Intimate Partner Violence: Not At Risk (03/04/2022)   Humiliation, Afraid, Rape, and Kick questionnaire    Fear of Current or Ex-Partner: No    Emotionally Abused: No    Physically Abused: No    Sexually Abused: No    FAMILY HISTORY:  Family History  Problem Relation Age of Onset   Colitis Sister 36       alive   Cancer Sister 47       colon   Cancer Mother 41       uterine   Heart disease Father 21       heart failure   Heart attack Brother 54   Healthy Daughter    Healthy Son    Pulmonary embolism Brother 46   Heart disease  Brother    Healthy Son    Healthy Son     CURRENT MEDICATIONS:  Outpatient Encounter Medications as of 04/28/2022  Medication Sig Note   acetaminophen (TYLENOL) 325 MG tablet Take 2 tablets (650 mg total) by mouth every 6 (six) hours as needed for mild pain or headache (or Fever >/= 101).    albuterol (VENTOLIN HFA) 108 (90 Base) MCG/ACT inhaler Inhale 2 puffs into the lungs every 6 (six) hours as needed for wheezing or shortness of breath. (Patient not taking: Reported on 03/14/2022) 03/08/2022: Has not used since hospital discharge but still has on hand   allopurinol (ZYLOPRIM) 100 MG tablet Take 1 tablet (100 mg total) by mouth daily.    amLODipine (NORVASC) 2.5 MG tablet Take 1 tablet (2.5 mg total) by mouth daily.    apixaban (ELIQUIS) 2.5 MG TABS tablet Take 1 tablet (2.5 mg total) by mouth 2 (two) times daily.    atorvastatin (LIPITOR) 20 MG tablet Take 1 tablet by mouth once daily    dapagliflozin propanediol (FARXIGA) 10 MG TABS tablet Take 1 tablet (10 mg total) by mouth daily.    docusate sodium (COLACE) 100 MG capsule Take 1 capsule (100 mg total) by mouth 2 (two) times daily. (Patient taking differently: Take 100 mg by mouth daily as needed for moderate constipation.)    furosemide (LASIX) 20 MG tablet Take 1 tablet (20 mg total) by mouth daily.    GNP CRANBERRY EXTRACT PO Take 500 mg by mouth in the morning.    levothyroxine (SYNTHROID) 50 MCG tablet TAKE 1 TABLET BY MOUTH ONCE DAILY BEFORE BREAKFAST    loratadine (CLARITIN) 10 MG tablet Take 10 mg by mouth daily.    oxybutynin (DITROPAN-XL) 10 MG 24 hr tablet Take 1 tablet (10 mg total) by mouth at bedtime.    pantoprazole (PROTONIX) 40 MG tablet Take 1 tablet (40 mg total) by mouth 2 (two) times daily before a meal.    polyethylene glycol (MIRALAX / GLYCOLAX) 17 g packet Take 17 g by mouth daily as needed for mild constipation. (Patient taking differently: Take 17 g by mouth daily.)    vitamin B-12 (CYANOCOBALAMIN) 50 MCG tablet  Take 50 mcg by mouth daily.    Vitamin D3 (VITAMIN D) 25 MCG tablet Take 1,000 Units by mouth daily.    No facility-administered encounter medications  on file as of 04/28/2022.    ALLERGIES:  No Known Allergies   PHYSICAL EXAM:  ECOG PERFORMANCE STATUS: 2 - Symptomatic, <50% confined to bed  There were no vitals filed for this visit. There were no vitals filed for this visit. Physical Exam Constitutional:      Appearance: Normal appearance. She is obese.  HENT:     Head: Normocephalic and atraumatic.     Mouth/Throat:     Mouth: Mucous membranes are moist.  Eyes:     Extraocular Movements: Extraocular movements intact.     Pupils: Pupils are equal, round, and reactive to light.  Cardiovascular:     Rate and Rhythm: Bradycardia present. Rhythm irregular.     Pulses: Normal pulses.     Heart sounds: Normal heart sounds.  Pulmonary:     Effort: Pulmonary effort is normal.     Breath sounds: Normal breath sounds.  Abdominal:     General: Bowel sounds are normal.     Palpations: Abdomen is soft.     Tenderness: There is no abdominal tenderness.  Musculoskeletal:        General: No swelling.     Right lower leg: Edema (trace) present.     Left lower leg: Edema (trace) present.  Lymphadenopathy:     Cervical: No cervical adenopathy.  Skin:    General: Skin is warm and dry.  Neurological:     General: No focal deficit present.     Mental Status: She is alert and oriented to person, place, and time.  Psychiatric:        Mood and Affect: Mood normal.        Behavior: Behavior normal.     LABORATORY DATA:  I have reviewed the labs as listed.  CBC    Component Value Date/Time   WBC 4.3 04/14/2022 1102   RBC 3.24 (L) 04/14/2022 1102   HGB 9.7 (L) 04/14/2022 1102   HGB 7.3 (LL) 02/18/2022 1111   HCT 32.8 (L) 04/14/2022 1102   HCT 23.3 (L) 02/18/2022 1111   PLT 224 04/14/2022 1102   PLT 430 02/18/2022 1111   MCV 101.2 (H) 04/14/2022 1102   MCV 90 02/18/2022 1111    MCH 29.9 04/14/2022 1102   MCHC 29.6 (L) 04/14/2022 1102   RDW 15.4 04/14/2022 1102   RDW 13.7 02/18/2022 1111   LYMPHSABS 1.3 03/07/2022 1310   LYMPHSABS 0.8 02/18/2022 1111   MONOABS 0.5 03/07/2022 1310   EOSABS 0.1 03/07/2022 1310   EOSABS 0.1 02/18/2022 1111   BASOSABS 0.0 03/07/2022 1310   BASOSABS 0.0 02/18/2022 1111      Latest Ref Rng & Units 02/17/2022   12:15 PM 02/11/2022    3:38 AM 02/10/2022    4:42 AM  CMP  Glucose 70 - 99 mg/dL 109  109  113   BUN 8 - 27 mg/dL '18  25  28   '$ Creatinine 0.57 - 1.00 mg/dL 1.15  1.11  1.45   Sodium 134 - 144 mmol/L 139  137  137   Potassium 3.5 - 5.2 mmol/L 4.5  3.7  3.6   Chloride 96 - 106 mmol/L 104  106  105   CO2 20 - 29 mmol/L '22  25  24   '$ Calcium 8.7 - 10.3 mg/dL 10.5  9.3  9.2   Total Protein 6.5 - 8.1 g/dL   5.3   Total Bilirubin 0.3 - 1.2 mg/dL   0.4   Alkaline Phos 38 - 126 U/L  69   AST 15 - 41 U/L   13   ALT 0 - 44 U/L   11     DIAGNOSTIC IMAGING:  I have independently reviewed the relevant imaging and discussed with the patient.  ASSESSMENT & PLAN: 1.  Iron deficiency anemia secondary to GI blood loss - Normocytic anemia secondary to chronic blood loss, may also have some anemia related to CKD stage IIIb - Hematology work-up (02/24/2022):  Ferritin 11, iron saturation 7%.  Normal folate, B12, MMA, copper. Reticulocytes 3.9% (consistent with acute blood loss), LDH normal, haptoglobin normal, DAT negative SPEP negative for monoclonal protein, but showed elevation in acute phase proteins - She has required intermittent blood transfusions, most recently on 02/24/2022 (Hgb 6.7) and again on 03/03/2022 (Hgb 6.6) - Most recent EGD (03/10/2022): GAVE without bleeding, treated with APC.  Normal duodenum and jejunum. - Colonoscopy (12/08/2019): Diverticulosis and polyps - She reports dark black bowel movements that improved after her EGD with APC treatment of GAVE   - She is on Eliquis 2.5 mg twice daily.  She takes iron tablet once  daily.   - She received IV Feraheme on 03/03/2022 and 03/14/2022 - She was symptomatic with severe fatigue and ice pica, which improved after IV iron and blood transfusions  - Labs today (04/28/2022): Hgb improved at 11.3 with MCV 98.7, ferritin 25, iron saturation 8% - PLAN: Recommend additional IV iron with Feraheme x2.  No blood transfusion today. - Continue iron tablet daily. - Repeat CBC and iron panel with RTC in 8 weeks.  - Continue GI follow-up.  2.   Unprovoked right leg DVT and PE: - Doppler on 04/03/2019: DVT in the femoral vein and calf veins. - CT chest PE protocol (04/03/2019): Moderate size pulmonary emboli in the right lower lobe. - She was treated with Eliquis full dose, currently dose reduced to 2.5 mg twice daily.  3.  Social/family history: - She lives at home with her older son.  She takes care of the garden and does laundry and her ADLs and IADLs.  But lately her energy levels are low and she is not able to do them.  She was a retired tobacco former.  Never smoked. - Sister had anemia.  Mother had uterine cancer and sister had colon cancer.   All questions were answered. The patient knows to call the clinic with any problems, questions or concerns.  Medical decision making: Low    Time spent on visit: I spent 15 minutes counseling the patient face to face. The total time spent in the appointment was 22 minutes and more than 50% was on counseling.   Harriett Rush, PA-C   04/28/22 10:52 AM

## 2022-04-28 ENCOUNTER — Inpatient Hospital Stay: Payer: PPO | Attending: Physician Assistant | Admitting: Physician Assistant

## 2022-04-28 ENCOUNTER — Inpatient Hospital Stay: Payer: PPO

## 2022-04-28 VITALS — BP 142/70 | HR 63 | Temp 98.9°F | Resp 18 | Ht 63.0 in | Wt 200.0 lb

## 2022-04-28 DIAGNOSIS — Z7901 Long term (current) use of anticoagulants: Secondary | ICD-10-CM | POA: Insufficient documentation

## 2022-04-28 DIAGNOSIS — D509 Iron deficiency anemia, unspecified: Secondary | ICD-10-CM | POA: Insufficient documentation

## 2022-04-28 DIAGNOSIS — D500 Iron deficiency anemia secondary to blood loss (chronic): Secondary | ICD-10-CM | POA: Diagnosis present

## 2022-04-28 DIAGNOSIS — F5089 Other specified eating disorder: Secondary | ICD-10-CM | POA: Insufficient documentation

## 2022-04-28 DIAGNOSIS — Z86711 Personal history of pulmonary embolism: Secondary | ICD-10-CM | POA: Diagnosis not present

## 2022-04-28 DIAGNOSIS — I10 Essential (primary) hypertension: Secondary | ICD-10-CM | POA: Insufficient documentation

## 2022-04-28 DIAGNOSIS — Z86718 Personal history of other venous thrombosis and embolism: Secondary | ICD-10-CM | POA: Insufficient documentation

## 2022-04-28 DIAGNOSIS — K219 Gastro-esophageal reflux disease without esophagitis: Secondary | ICD-10-CM | POA: Diagnosis not present

## 2022-04-28 DIAGNOSIS — Z79890 Hormone replacement therapy (postmenopausal): Secondary | ICD-10-CM | POA: Insufficient documentation

## 2022-04-28 DIAGNOSIS — Z7984 Long term (current) use of oral hypoglycemic drugs: Secondary | ICD-10-CM | POA: Diagnosis not present

## 2022-04-28 DIAGNOSIS — Z79899 Other long term (current) drug therapy: Secondary | ICD-10-CM | POA: Diagnosis not present

## 2022-04-28 DIAGNOSIS — Z85828 Personal history of other malignant neoplasm of skin: Secondary | ICD-10-CM | POA: Insufficient documentation

## 2022-04-28 DIAGNOSIS — E039 Hypothyroidism, unspecified: Secondary | ICD-10-CM | POA: Insufficient documentation

## 2022-04-28 DIAGNOSIS — K5900 Constipation, unspecified: Secondary | ICD-10-CM | POA: Diagnosis not present

## 2022-04-28 LAB — FERRITIN: Ferritin: 25 ng/mL (ref 11–307)

## 2022-04-28 LAB — CBC WITH DIFFERENTIAL/PLATELET
Abs Immature Granulocytes: 0.02 10*3/uL (ref 0.00–0.07)
Basophils Absolute: 0 10*3/uL (ref 0.0–0.1)
Basophils Relative: 1 %
Eosinophils Absolute: 0.1 10*3/uL (ref 0.0–0.5)
Eosinophils Relative: 3 %
HCT: 37.8 % (ref 36.0–46.0)
Hemoglobin: 11.3 g/dL — ABNORMAL LOW (ref 12.0–15.0)
Immature Granulocytes: 0 %
Lymphocytes Relative: 28 %
Lymphs Abs: 1.3 10*3/uL (ref 0.7–4.0)
MCH: 29.5 pg (ref 26.0–34.0)
MCHC: 29.9 g/dL — ABNORMAL LOW (ref 30.0–36.0)
MCV: 98.7 fL (ref 80.0–100.0)
Monocytes Absolute: 0.7 10*3/uL (ref 0.1–1.0)
Monocytes Relative: 15 %
Neutro Abs: 2.3 10*3/uL (ref 1.7–7.7)
Neutrophils Relative %: 53 %
Platelets: 232 10*3/uL (ref 150–400)
RBC: 3.83 MIL/uL — ABNORMAL LOW (ref 3.87–5.11)
RDW: 14.6 % (ref 11.5–15.5)
WBC: 4.5 10*3/uL (ref 4.0–10.5)
nRBC: 0 % (ref 0.0–0.2)

## 2022-04-28 LAB — IRON AND TIBC
Iron: 32 ug/dL (ref 28–170)
Saturation Ratios: 8 % — ABNORMAL LOW (ref 10.4–31.8)
TIBC: 390 ug/dL (ref 250–450)
UIBC: 358 ug/dL

## 2022-04-28 NOTE — Patient Instructions (Signed)
[name] **   You were seen today by Tarri Abernethy PA-C for your iron deficiency anemia.    IRON DEFICIENCY ANEMIA: This is from your chronic blood loss from your stomach. After today's visit, you we will have received 2 units of IV iron. We will recheck your iron levels and see you for an office visit in 8 weeks. Continue iron tablet ONCE daily.   ** Thank you for trusting me with your healthcare!  I strive to provide all of my patients with quality care at each visit.  If you receive a survey for this visit, I would be so grateful to you for taking the time to provide feedback.  Thank you in advance!  ~ [place]                   Dr. Derek Jack   &   Tarri Abernethy, PA-C   - - - - - - - - - - - - - - - - - -    Thank you for choosing [facility] to provide your oncology and hematology care.  To afford each patient quality time with our provider, please arrive at least 15 minutes before your scheduled appointment time.   If you have a lab appointment with the [facility] please come in thru the Main Entrance and check in at the main information desk.  You need to re-schedule your appointment should you arrive 10 or more minutes late.  We strive to give you quality time with our providers, and arriving late affects you and other patients whose appointments are after yours.  Also, if you no show three or more times for appointments you may be dismissed from the clinic at the providers discretion.     Again, thank you for choosing [facility].  Our hope is that these requests will decrease the amount of time that you wait before being seen by our physicians.       _____________________________________________________________  Should you have questions after your visit to [facility], please contact our office at [phone] and  follow the prompts.  Our office hours are 8:00 a.m. and 4:30 p.m. Monday - Friday.  Please note that voicemails left after 4:00 p.m. may not be returned until the following business day.  We are closed weekends and major holidays.  You do have access to a nurse 24-7, just call the main number to the clinic [phone] and do not press any options, hold on the line and a nurse will answer the phone.    For prescription refill requests, have your pharmacy contact our office and allow 72 hours.

## 2022-04-29 ENCOUNTER — Inpatient Hospital Stay: Payer: PPO

## 2022-05-02 ENCOUNTER — Inpatient Hospital Stay: Payer: PPO

## 2022-05-02 VITALS — BP 150/96 | HR 61 | Temp 96.9°F | Resp 18

## 2022-05-02 DIAGNOSIS — D509 Iron deficiency anemia, unspecified: Secondary | ICD-10-CM

## 2022-05-02 MED ORDER — ACETAMINOPHEN 325 MG PO TABS
650.0000 mg | ORAL_TABLET | Freq: Once | ORAL | Status: AC
  Administered 2022-05-02: 650 mg via ORAL
  Filled 2022-05-02: qty 2

## 2022-05-02 MED ORDER — SODIUM CHLORIDE 0.9 % IV SOLN
510.0000 mg | Freq: Once | INTRAVENOUS | Status: AC
  Administered 2022-05-02: 510 mg via INTRAVENOUS
  Filled 2022-05-02: qty 510

## 2022-05-02 MED ORDER — SODIUM CHLORIDE 0.9 % IV SOLN: Freq: Once | INTRAVENOUS | Status: AC

## 2022-05-02 NOTE — Patient Instructions (Signed)
[facility]  Discharge Instructions: Thank you for choosing [facility] to provide your oncology and hematology care.  If you have a lab appointment with the Cancer Center, please come in thru the Main Entrance and check in at the main information desk.  Wear comfortable clothing and clothing appropriate for easy access to any Portacath or PICC line.   We strive to give you quality time with your provider. You may need to reschedule your appointment if you arrive late (15 or more minutes).  Arriving late affects you and other patients whose appointments are after yours.  Also, if you miss three or more appointments without notifying the office, you may be dismissed from the clinic at the provider's discretion.      For prescription refill requests, have your pharmacy contact our office and allow 72 hours for refills to be completed.    Today you received the following chemotherapy and/or immunotherapy agents feraheme      To help prevent nausea and vomiting after your treatment, we encourage you to take your nausea medication as directed.  BELOW ARE SYMPTOMS THAT SHOULD BE REPORTED IMMEDIATELY: *FEVER GREATER THAN 100.4 F (38 C) OR HIGHER *CHILLS OR SWEATING *NAUSEA AND VOMITING THAT IS NOT CONTROLLED WITH YOUR NAUSEA MEDICATION *UNUSUAL SHORTNESS OF BREATH *UNUSUAL BRUISING OR BLEEDING *URINARY PROBLEMS (pain or burning when urinating, or frequent urination) *BOWEL PROBLEMS (unusual diarrhea, constipation, pain near the anus) TENDERNESS IN MOUTH AND THROAT WITH OR WITHOUT PRESENCE OF ULCERS (sore throat, sores in mouth, or a toothache) UNUSUAL RASH, SWELLING OR PAIN  UNUSUAL VAGINAL DISCHARGE OR ITCHING   Items with * indicate a potential emergency and should be followed up as soon as possible or go to the Emergency Department if any problems should occur.  Please show the CHEMOTHERAPY ALERT CARD or IMMUNOTHERAPY ALERT CARD at check-in to the  Emergency Department and triage nurse.  Should you have questions after your visit or need to cancel or reschedule your appointment, please contact [facility] [phone]  and follow the prompts.  Office hours are 8:00 a.m. to 4:30 p.m. Monday - Friday. Please note that voicemails left after 4:00 p.m. may not be returned until the following business day.  We are closed weekends and major holidays. You have access to a nurse at all times for urgent questions. Please call the main number to the clinic [phone] and follow the prompts.  For any non-urgent questions, you may also contact your provider using MyChart. We now offer e-Visits for anyone 18 and older to request care online for non-urgent symptoms. For details visit [email or web address].   Also download the MyChart app! Go to the app store, search "MyChart", open the app, select [facility], and log in with your MyChart username and password.  Masks are optional in the cancer centers. If you would like for your care team to wear a mask while they are taking care of you, please let them know. You may have one support person who is at least 80 years old accompany you for your appointments.  

## 2022-05-02 NOTE — Progress Notes (Signed)
Patient presents today for Feraheme infusion per providers order.  Vital signs WNL.  Patient has no new complaints at this time.  Peripheral IV started and blood return noted pre and post infusion.  Stable during infusion without adverse affects.  Vital signs stable.  No complaints at this time.  Discharge from clinic via wheelchair in stable condition.  Alert and oriented X 3.  Follow up with [facility] as scheduled.

## 2022-05-05 NOTE — Telephone Encounter (Signed)
Received notification from [employer] regarding RE-ENROLLMENT approval for FARXIGA. Patient assistance approved from 07/25/22 to 07/25/23.  Phone: [phone]  

## 2022-05-06 ENCOUNTER — Ambulatory Visit: Payer: PPO | Admitting: Family Medicine

## 2022-05-09 ENCOUNTER — Encounter: Payer: Self-pay | Admitting: Family Medicine

## 2022-05-09 ENCOUNTER — Ambulatory Visit (INDEPENDENT_AMBULATORY_CARE_PROVIDER_SITE_OTHER): Payer: PPO | Admitting: Family Medicine

## 2022-05-09 ENCOUNTER — Inpatient Hospital Stay: Payer: PPO

## 2022-05-09 VITALS — BP 159/57 | HR 107 | Temp 97.7°F | Resp 18

## 2022-05-09 VITALS — BP 131/71 | HR 63 | Temp 98.2°F | Ht 63.0 in | Wt 203.5 lb

## 2022-05-09 DIAGNOSIS — R7303 Prediabetes: Secondary | ICD-10-CM | POA: Diagnosis not present

## 2022-05-09 DIAGNOSIS — E039 Hypothyroidism, unspecified: Secondary | ICD-10-CM

## 2022-05-09 DIAGNOSIS — I5020 Unspecified systolic (congestive) heart failure: Secondary | ICD-10-CM

## 2022-05-09 DIAGNOSIS — I10 Essential (primary) hypertension: Secondary | ICD-10-CM

## 2022-05-09 DIAGNOSIS — Z23 Encounter for immunization: Secondary | ICD-10-CM

## 2022-05-09 DIAGNOSIS — E782 Mixed hyperlipidemia: Secondary | ICD-10-CM | POA: Diagnosis not present

## 2022-05-09 DIAGNOSIS — I340 Nonrheumatic mitral (valve) insufficiency: Secondary | ICD-10-CM

## 2022-05-09 DIAGNOSIS — N1831 Chronic kidney disease, stage 3a: Secondary | ICD-10-CM

## 2022-05-09 DIAGNOSIS — E210 Primary hyperparathyroidism: Secondary | ICD-10-CM

## 2022-05-09 DIAGNOSIS — D500 Iron deficiency anemia secondary to blood loss (chronic): Secondary | ICD-10-CM

## 2022-05-09 DIAGNOSIS — I482 Chronic atrial fibrillation, unspecified: Secondary | ICD-10-CM

## 2022-05-09 DIAGNOSIS — D509 Iron deficiency anemia, unspecified: Secondary | ICD-10-CM | POA: Diagnosis not present

## 2022-05-09 DIAGNOSIS — M1A9XX0 Chronic gout, unspecified, without tophus (tophi): Secondary | ICD-10-CM

## 2022-05-09 DIAGNOSIS — K219 Gastro-esophageal reflux disease without esophagitis: Secondary | ICD-10-CM

## 2022-05-09 LAB — CBC WITH DIFFERENTIAL/PLATELET
Basophils Absolute: 0 10*3/uL (ref 0.0–0.2)
Basos: 1 %
EOS (ABSOLUTE): 0.2 10*3/uL (ref 0.0–0.4)
Eos: 3 %
Hematocrit: 38.3 % (ref 34.0–46.6)
Hemoglobin: 11.8 g/dL (ref 11.1–15.9)
Immature Grans (Abs): 0 10*3/uL (ref 0.0–0.1)
Immature Granulocytes: 0 %
Lymphocytes Absolute: 1 10*3/uL (ref 0.7–3.1)
Lymphs: 21 %
MCH: 29.6 pg (ref 26.6–33.0)
MCHC: 30.8 g/dL — ABNORMAL LOW (ref 31.5–35.7)
MCV: 96 fL (ref 79–97)
Monocytes Absolute: 0.6 10*3/uL (ref 0.1–0.9)
Monocytes: 12 %
Neutrophils Absolute: 3 10*3/uL (ref 1.4–7.0)
Neutrophils: 63 %
Platelets: 246 10*3/uL (ref 150–450)
RBC: 3.98 x10E6/uL (ref 3.77–5.28)
RDW: 13.6 % (ref 11.7–15.4)
WBC: 4.8 10*3/uL (ref 3.4–10.8)

## 2022-05-09 LAB — CMP14+EGFR
ALT: 13 IU/L (ref 0–32)
AST: 14 IU/L (ref 0–40)
Albumin/Globulin Ratio: 2.3 — ABNORMAL HIGH (ref 1.2–2.2)
Albumin: 4.4 g/dL (ref 3.8–4.8)
Alkaline Phosphatase: 109 IU/L (ref 44–121)
BUN/Creatinine Ratio: 17 (ref 12–28)
BUN: 23 mg/dL (ref 8–27)
Bilirubin Total: 0.2 mg/dL (ref 0.0–1.2)
CO2: 22 mmol/L (ref 20–29)
Calcium: 11.2 mg/dL — ABNORMAL HIGH (ref 8.7–10.3)
Chloride: 108 mmol/L — ABNORMAL HIGH (ref 96–106)
Creatinine, Ser: 1.34 mg/dL — ABNORMAL HIGH (ref 0.57–1.00)
Globulin, Total: 1.9 g/dL (ref 1.5–4.5)
Glucose: 96 mg/dL (ref 70–99)
Potassium: 3.9 mmol/L (ref 3.5–5.2)
Sodium: 143 mmol/L (ref 134–144)
Total Protein: 6.3 g/dL (ref 6.0–8.5)
eGFR: 40 mL/min/{1.73_m2} — ABNORMAL LOW (ref >59)

## 2022-05-09 LAB — LIPID PANEL
Chol/HDL Ratio: 3.2 ratio (ref 0.0–4.4)
Cholesterol, Total: 161 mg/dL (ref 100–199)
HDL: 50 mg/dL (ref >39)
LDL Chol Calc (NIH): 86 mg/dL (ref 0–99)
Triglycerides: 145 mg/dL (ref 0–149)
VLDL Cholesterol Cal: 25 mg/dL (ref 5–40)

## 2022-05-09 LAB — BAYER DCA HB A1C WAIVED: HB A1C (BAYER DCA - WAIVED): 4.6 % — ABNORMAL LOW (ref 4.8–5.6)

## 2022-05-09 MED ORDER — SODIUM CHLORIDE 0.9 % IV SOLN
510.0000 mg | Freq: Once | INTRAVENOUS | Status: AC
  Administered 2022-05-09: 510 mg via INTRAVENOUS
  Filled 2022-05-09: qty 17

## 2022-05-09 MED ORDER — SODIUM CHLORIDE 0.9 % IV SOLN: Freq: Once | INTRAVENOUS | Status: AC

## 2022-05-09 MED ORDER — AMLODIPINE BESYLATE 2.5 MG PO TABS: 2.5000 mg | ORAL_TABLET | Freq: Every day | ORAL | 3 refills | Status: DC

## 2022-05-09 NOTE — Patient Instructions (Signed)
Hypertension, Adult High blood pressure (hypertension) is when the force of blood pumping through the arteries is too strong. The arteries are the blood vessels that carry blood from the heart throughout the body. Hypertension forces the heart to work harder to pump blood and may cause arteries to become narrow or stiff. Untreated or uncontrolled hypertension can lead to a heart attack, heart failure, a stroke, kidney disease, and other problems. A blood pressure reading consists of a higher number over a lower number. Ideally, your blood pressure should be below 120/80. The first ("top") number is called the systolic pressure. It is a measure of the pressure in your arteries as your heart beats. The second ("bottom") number is called the diastolic pressure. It is a measure of the pressure in your arteries as the heart relaxes. What are the causes? The exact cause of this condition is not known. There are some conditions that result in high blood pressure. What increases the risk? Certain factors may make you more likely to develop high blood pressure. Some of these risk factors are under your control, including: Smoking. Not getting enough exercise or physical activity. Being overweight. Having too much fat, sugar, calories, or salt (sodium) in your diet. Drinking too much alcohol. Other risk factors include: Having a personal history of heart disease, diabetes, high cholesterol, or kidney disease. Stress. Having a family history of high blood pressure and high cholesterol. Having obstructive sleep apnea. Age. The risk increases with age. What are the signs or symptoms? High blood pressure may not cause symptoms. Very high blood pressure (hypertensive crisis) may cause: Headache. Fast or irregular heartbeats (palpitations). Shortness of breath. Nosebleed. Nausea and vomiting. Vision changes. Severe chest pain, dizziness, and seizures. How is this diagnosed? This condition is diagnosed by  measuring your blood pressure while you are seated, with your arm resting on a flat surface, your legs uncrossed, and your feet flat on the floor. The cuff of the blood pressure monitor will be placed directly against the skin of your upper arm at the level of your heart. Blood pressure should be measured at least twice using the same arm. Certain conditions can cause a difference in blood pressure between your right and left arms. If you have a high blood pressure reading during one visit or you have normal blood pressure with other risk factors, you may be asked to: Return on a different day to have your blood pressure checked again. Monitor your blood pressure at home for 1 week or longer. If you are diagnosed with hypertension, you may have other blood or imaging tests to help your health care provider understand your overall risk for other conditions. How is this treated? This condition is treated by making healthy lifestyle changes, such as eating healthy foods, exercising more, and reducing your alcohol intake. You may be referred for counseling on a healthy diet and physical activity. Your health care provider may prescribe medicine if lifestyle changes are not enough to get your blood pressure under control and if: Your systolic blood pressure is above 130. Your diastolic blood pressure is above 80. Your personal target blood pressure may vary depending on your medical conditions, your age, and other factors. Follow these instructions at home: Eating and drinking  Eat a diet that is high in fiber and potassium, and low in sodium, added sugar, and fat. An example of this eating plan is called the DASH diet. DASH stands for Dietary Approaches to Stop Hypertension. To eat this way: Eat   plenty of fresh fruits and vegetables. Try to fill one half of your plate at each meal with fruits and vegetables. Eat whole grains, such as whole-wheat pasta, brown rice, or whole-grain bread. Fill about one  fourth of your plate with whole grains. Eat or drink low-fat dairy products, such as skim milk or low-fat yogurt. Avoid fatty cuts of meat, processed or cured meats, and poultry with skin. Fill about one fourth of your plate with lean proteins, such as fish, chicken without skin, beans, eggs, or tofu. Avoid pre-made and processed foods. These tend to be higher in sodium, added sugar, and fat. Reduce your daily sodium intake. Many people with hypertension should eat less than 1,500 mg of sodium a day. Do not drink alcohol if: Your health care provider tells you not to drink. You are pregnant, may be pregnant, or are planning to become pregnant. If you drink alcohol: Limit how much you have to: 0-1 drink a day for women. 0-2 drinks a day for men. Know how much alcohol is in your drink. In the [place] one drink equals one 12 oz bottle of beer (355 mL), one 5 oz glass of wine (148 mL), or one 1 oz glass of hard liquor (44 mL). Lifestyle  Work with your health care provider to maintain a healthy body weight or to lose weight. Ask what an ideal weight is for you. Get at least 30 minutes of exercise that causes your heart to beat faster (aerobic exercise) most days of the week. Activities may include walking, swimming, or biking. Include exercise to strengthen your muscles (resistance exercise), such as Pilates or lifting weights, as part of your weekly exercise routine. Try to do these types of exercises for 30 minutes at least 3 days a week. Do not use any products that contain nicotine or tobacco. These products include cigarettes, chewing tobacco, and vaping devices, such as e-cigarettes. If you need help quitting, ask your health care provider. Monitor your blood pressure at home as told by your health care provider. Keep all follow-up visits. This is important. Medicines Take over-the-counter and prescription medicines only as told by your health care provider. Follow directions carefully. Blood  pressure medicines must be taken as prescribed. Do not skip doses of blood pressure medicine. Doing this puts you at risk for problems and can make the medicine less effective. Ask your health care provider about side effects or reactions to medicines that you should watch for. Contact a health care provider if you: Think you are having a reaction to a medicine you are taking. Have headaches that keep coming back (recurring). Feel dizzy. Have swelling in your ankles. Have trouble with your vision. Get help right away if you: Develop a severe headache or confusion. Have unusual weakness or numbness. Feel faint. Have severe pain in your chest or abdomen. Vomit repeatedly. Have trouble breathing. These symptoms may be an emergency. Get help right away. Call 911. Do not wait to see if the symptoms will go away. Do not drive yourself to the hospital. Summary Hypertension is when the force of blood pumping through your arteries is too strong. If this condition is not controlled, it may put you at risk for serious complications. Your personal target blood pressure may vary depending on your medical conditions, your age, and other factors. For most people, a normal blood pressure is less than 120/80. Hypertension is treated with lifestyle changes, medicines, or a combination of both. Lifestyle changes include losing weight, eating a healthy,   low-sodium diet, exercising more, and limiting alcohol. This information is not intended to replace advice given to you by your health care provider. Make sure you discuss any questions you have with your health care provider. Document Revised: 05/18/2021 Document Reviewed: 05/18/2021 Elsevier Patient Education  2023 Elsevier Inc.  

## 2022-05-09 NOTE — Progress Notes (Signed)
Pt presents today for Feraheme IV iron per provider's order. Vital signs stable and pt voiced no new complaints at this time.  Pt took pre-meds at home prior to arrival. Perpheral IV started with good blood return pre and post infusion.  Feraheme given today per MD orders. Tolerated infusion without adverse affects. Vital signs stable. No complaints at this time. Discharged from clinic via wheelchair in stable condition. Alert and oriented x 3. F/U with [facility] as scheduled.

## 2022-05-09 NOTE — Progress Notes (Signed)
Established Patient Office Visit  Subjective   Patient ID: [name] [name], female    DOB: [birth year]  Age: 81 y.o. MRN: [id number]  Chief Complaint  Patient presents with   Medical Management of Chronic Issues   Hyperlipidemia   Hypertension   Prediabetes    Hyperlipidemia  Hypertension   HTN Complaint with meds - Yes Current Medications - amlodipine 2.5 mg, lasix Checking BP at home ranging: 130/70s Pertinent ROS:  Fatigue - improving Visual Disturbances - No Chest pain - No Dyspnea - with activity, stable Palpitations - No LE edema - baseline  She has an appt with cardiology in November. She takes eliquis daily for chronic a.fib.   2. Anemia/GI bleed GI did an EGD and did find a bleed. This was sealed off. She will follow up with [facility] in December. She has also established with hematology she did have to receive some blood transfusions in August and then has had multiple iron infusion. Reports her hemoglobin has been coming up but is not quite normal yet. She is feeling much better now and has more energy and stamina. She denies any blood in her stool.   3. GERD Well controlled with protonix.   4. Gout Takes allopurinol daily. Denies any gout flares this year.   5. Hypothyroidism On levothyroxine daily. Denies symptoms.   She sees endo in December for hyperparathyroidism   She is living with her older son. He is now using a rolling walker.      ROS Negative unless specially indicated above in HPI.   Objective:     BP 131/71   Pulse 63   Temp 98.2 F (36.8 C) (Temporal)   Ht 5' 3"  (1.6 m)   Wt 203 lb 8 oz (92.3 kg)   SpO2 99%   BMI 36.05 kg/m    Physical Exam Vitals and nursing note reviewed.  Constitutional:      General: She is not in acute distress.    Appearance: She is not ill-appearing, toxic-appearing or diaphoretic.  Neck:     Thyroid: No thyroid mass, thyromegaly or thyroid tenderness.     Vascular: No carotid bruit.   Cardiovascular:     Rate and Rhythm: Normal rate and regular rhythm.     Heart sounds: Normal heart sounds. No murmur heard. Pulmonary:     Effort: Pulmonary effort is normal. No respiratory distress.     Breath sounds: Normal breath sounds.  Abdominal:     General: Bowel sounds are normal. There is no distension.     Palpations: Abdomen is soft.     Tenderness: There is no abdominal tenderness. There is no guarding or rebound.  Musculoskeletal:     Cervical back: Neck supple.     Right lower leg: No edema.     Left lower leg: No edema.  Skin:    General: Skin is warm and dry.  Neurological:     General: No focal deficit present.     Mental Status: She is alert and oriented to person, place, and time.  Psychiatric:        Mood and Affect: Mood normal.        Behavior: Behavior normal.        Thought Content: Thought content normal.        Judgment: Judgment normal.      No results found for any visits on 05/09/22.    The ASCVD Risk score (Arnett DK, et al., 2019) failed to calculate for the  following reasons:   The 2019 ASCVD risk score is only valid for ages 42 to 19    Assessment & Plan:   Kaleena was seen today for medical management of chronic issues, hyperlipidemia, hypertension and prediabetes.  Diagnoses and all orders for this visit:  Prediabetes A1c is 4.6 today. Labs pending. On farxiga.  -     Bayer DCA Hb A1c Waived -     Microalbumin / creatinine urine ratio  Primary hypertension Well controlled on current regimen. Continue norvasc.  -     CBC with Differential/Platelet -     amLODipine (NORVASC) 2.5 MG tablet; Take 1 tablet (2.5 mg total) by mouth daily.  Stage 3a chronic kidney disease ([place]) Labs pending. On farxiga.  -     CMP14+EGFR  Mixed hyperlipidemia On atorvastatin. Labs pending.  -     Lipid panel  Iron deficiency anemia due to chronic blood loss Managed by hematology. Improving.   Morbid obesity (HCC) BMI of 36 with HLD, HTN.  Diet and exercise as tolerated.   Acquired hypothyroidism On levothyroxine. TSH pending.  -     TSH  Primary hyperparathyroidism ([place]) Managed by endo.   Chronic a-fib ([place]) On eliquis.   Severe systolic congestive heart failure (H[facility]) Nonrheumatic mitral valve regurgitation Managed by cardiology. Has appt next week.   Gastroesophageal reflux disease without esophagitis Well controlled on current regimen. On prilosec.   Chronic gout without tophus, unspecified cause, unspecified site Well controlled on current regimen. On allopurinol.   Need for immunization against influenza -     Flu Vaccine QUAD High Dose(Fluad)   Return in about 6 months (around 11/08/2022) for chronic follow up.   The patient indicates understanding of these issues and agrees with the plan.   [name], FNP

## 2022-05-09 NOTE — Patient Instructions (Signed)
[facility]  Discharge Instructions: Thank you for choosing [facility] to provide your oncology and hematology care.  If you have a lab appointment with the Cancer Center, please come in thru the Main Entrance and check in at the main information desk.  Wear comfortable clothing and clothing appropriate for easy access to any Portacath or PICC line.   We strive to give you quality time with your provider. You may need to reschedule your appointment if you arrive late (15 or more minutes).  Arriving late affects you and other patients whose appointments are after yours.  Also, if you miss three or more appointments without notifying the office, you may be dismissed from the clinic at the provider's discretion.      For prescription refill requests, have your pharmacy contact our office and allow 72 hours for refills to be completed.    Today you received Feraheme IV iron.     BELOW ARE SYMPTOMS THAT SHOULD BE REPORTED IMMEDIATELY: *FEVER GREATER THAN 100.4 F (38 C) OR HIGHER *CHILLS OR SWEATING *NAUSEA AND VOMITING THAT IS NOT CONTROLLED WITH YOUR NAUSEA MEDICATION *UNUSUAL SHORTNESS OF BREATH *UNUSUAL BRUISING OR BLEEDING *URINARY PROBLEMS (pain or burning when urinating, or frequent urination) *BOWEL PROBLEMS (unusual diarrhea, constipation, pain near the anus) TENDERNESS IN MOUTH AND THROAT WITH OR WITHOUT PRESENCE OF ULCERS (sore throat, sores in mouth, or a toothache) UNUSUAL RASH, SWELLING OR PAIN  UNUSUAL VAGINAL DISCHARGE OR ITCHING   Items with * indicate a potential emergency and should be followed up as soon as possible or go to the Emergency Department if any problems should occur.  Please show the CHEMOTHERAPY ALERT CARD or IMMUNOTHERAPY ALERT CARD at check-in to the Emergency Department and triage nurse.  Should you have questions after your visit or need to cancel or reschedule your appointment, please contact [facility] [phone]  and follow the prompts.  Office hours are 8:00 a.m. to 4:30 p.m. Monday - Friday. Please note that voicemails left after 4:00 p.m. may not be returned until the following business day.  We are closed weekends and major holidays. You have access to a nurse at all times for urgent questions. Please call the main number to the clinic [phone] and follow the prompts.  For any non-urgent questions, you may also contact your provider using MyChart. We now offer e-Visits for anyone 18 and older to request care online for non-urgent symptoms. For details visit [email or web address].   Also download the MyChart app! Go to the app store, search "MyChart", open the app, select [place], and log in with your MyChart username and password.  Masks are optional in the cancer centers. If you would like for your care team to wear a mask while they are taking care of you, please let them know. You may have one support person who is at least 80 years old accompany you for your appointments.  

## 2022-05-10 LAB — MICROALBUMIN / CREATININE URINE RATIO
Creatinine, Urine: 20.7 mg/dL
Microalb/Creat Ratio: 183 mg/g creat — ABNORMAL HIGH (ref 0–29)
Microalbumin, Urine: 37.8 ug/mL

## 2022-05-11 ENCOUNTER — Other Ambulatory Visit: Payer: Self-pay | Admitting: Family Medicine

## 2022-05-11 DIAGNOSIS — R809 Proteinuria, unspecified: Secondary | ICD-10-CM

## 2022-05-11 LAB — TSH: TSH: 2.34 u[IU]/mL (ref 0.450–4.500)

## 2022-05-11 LAB — SPECIMEN STATUS REPORT

## 2022-05-11 MED ORDER — LISINOPRIL 2.5 MG PO TABS: 2.5000 mg | ORAL_TABLET | Freq: Every day | ORAL | 3 refills | Status: DC

## 2022-05-13 ENCOUNTER — Telehealth: Payer: Self-pay | Admitting: Family Medicine

## 2022-05-13 NOTE — Telephone Encounter (Signed)
Patient would like to speak to nurse about labs from 10/16. Please call back.

## 2022-05-13 NOTE — Telephone Encounter (Signed)
I spoke to pt regarding her labs and answered all her questions.

## 2022-05-19 ENCOUNTER — Ambulatory Visit: Payer: PPO | Admitting: Cardiology

## 2022-05-29 NOTE — Progress Notes (Unsigned)
Cardiology Office Note   Date:  06/01/2022   ID:  [name], [name] Dec 10, 1940, MRN [id number]  PCP:  [name], FNP  Cardiologist:   Minus Breeding, MD   Chief Complaint  Patient presents with   Atrial Fibrillation       History of Present Illness: [name] [name] is a 81 y.o. female who presents for management of chronic systolic CHF and persistent atrial fibrillation with CHADS  VASC Score of 6.   Echocardiogram on 02/07/21 revealed 35% to 40%, moderate elevated PASP with estimated RVSP 51.5 mmHg, severely dilated LA, moderate dilated RA, severe MR moderate TR, mild aortic sclerosis.  She did have a repeat echocardiogram which was completed on 04/01/2021.  She was found to have a normal ejection fraction of 55 to 60% with normal LV function.  It was however found that she had severe mitral valve regurgitation with no evidence of mitral valve stenosis, moderate mitral annular calcification.  PI SA ER O0 0.44 cm, MR radius 1.1 cm and MR regurgitant volume of 92 cc.  She was recommended for TEE for further assessment of mitral regurgitation.   She had this in Dec and her MR was thought to be moderate and this was reviewed by the structural team and intervention was not suggested.    Since I last saw her she was in the hospital in July with sepsis.  I did review these records for this visit.  Sepsis was related to pneumonia.  He did have some renal insufficiency.  She had GI bleeding.  Her hemoglobin went down to 6.  There is a mention of her being in atrial fibrillation but I am seeing evidence of that.  She is been in sinus rhythm and have seen her recently.  She does not feel palpitations.  She had no PND or orthopnea.  She said no chest pressure, neck or arm discomfort.  Of note I did an echocardiogram in September and her EF was low normal as it had been previously.  There is mild mitral regurgitation. There are no other significant findings.  She walks with a  walker   Past Medical History:  Diagnosis Date   Anemia    Arthritis    Ostearthritis- hips, knees, fingers   Basal cell carcinoma (BCC) of right hand 10/2021   Dx by [facility]   DVT (deep venous thrombosis) (HCC)    Dyspnea    GERD (gastroesophageal reflux disease)    Headache(784.0)    tx. Valproic acid   History of diabetes mellitus    History of kidney stones    Hypertension    Hypothyroidism    Pulmonary embolism [facility])     Past Surgical History:  Procedure Laterality Date   ABDOMINAL HYSTERECTOMY     BILATERAL HIP ARTHROSCOPY Left    BIOPSY  12/08/2019   Procedure: BIOPSY;  Surgeon: Daneil Dolin, MD;  Location: [facility];  Service: Endoscopy;;   CATARACT EXTRACTION, BILATERAL     COLONOSCOPY N/A 12/08/2019   polyps (tubular adenoma), diverticulosis, colonic lipoma, no surveillance due to age   67 W/ [place] Right 12/25/2020   Procedure: CYSTOSCOPY WITH RETROGRADE PYELOGRAM/URETERAL STENT PLACEMENT;  Surgeon: Janith Lima, MD;  Location: [facility];  Service: Urology;  Laterality: Right;   CYSTOSCOPY/URETEROSCOPY/HOLMIUM LASER/STENT PLACEMENT Right 01/15/2021   Procedure: CYSTOSCOPY/RETROGRADE/URETEROSCOPY/HOLMIUM LASER/STENT EXCHANGE;  Surgeon: Janith Lima, MD;  Location: [facility];  Service: Urology;  Laterality: Right;   ESOPHAGOGASTRODUODENOSCOPY N/A  12/08/2019   normal esophagus with 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   ESOPHAGOGASTRODUODENOSCOPY (EGD) WITH PROPOFOL N/A 05/18/2021   Surgeon: Clarene Essex, MD;   Tiny hiatal hernia, gastric antral vascular ectasia s/p ablation, normal duodenum, normal jejunum.   ESOPHAGOGASTRODUODENOSCOPY (EGD) WITH PROPOFOL N/A 03/10/2022   Procedure: ESOPHAGOGASTRODUODENOSCOPY (EGD) WITH PROPOFOL;  Surgeon: Eloise Harman, DO;  [facility];  Service: Endoscopy;  Laterality: N/A;  10:45am   GIVENS CAPSULE STUDY N/A 01/13/2020   Procedure: GIVENS  CAPSULE STUDY;  Surgeon: Daneil Dolin, MD;  [facility]: [facility];  Service: Endoscopy;  Laterality: N/A;  7:30am   GIVENS CAPSULE STUDY N/A 05/04/2021   Procedure: GIVENS CAPSULE STUDY;  Surgeon: Clarene Essex, MD;  Location: [facility];  Service: Endoscopy;  Laterality: N/A;   MULTIPLE TOOTH EXTRACTIONS     60's   PARATHYROIDECTOMY     POLYPECTOMY  12/08/2019   Procedure: POLYPECTOMY;  Surgeon: Daneil Dolin, MD;  [facility]: [facility];  Service: Endoscopy;;   RADIOFREQUENCY ABLATION  05/18/2021   Procedure: RADIO FREQUENCY ABLATION;  Surgeon: Clarene Essex, MD;  Location: [facility];  Service: Endoscopy;;   TEE WITHOUT CARDIOVERSION N/A 07/06/2021   Procedure: TRANSESOPHAGEAL ECHOCARDIOGRAM (TEE);  Surgeon: Donato Heinz, MD;  Location: [facility] ENDOSCOPY;  Service: Cardiovascular;  Laterality: N/A;   TOTAL HIP ARTHROPLASTY Right 10/29/2013   Procedure: RIGHT TOTAL HIP ARTHROPLASTY ANTERIOR APPROACH;  Surgeon: Mauri Pole, MD;  Location: [facility];  Service: Orthopedics;  Laterality: Right;     Current Outpatient Medications  Medication Sig Dispense Refill   acetaminophen (TYLENOL) 325 MG tablet Take 2 tablets (650 mg total) by mouth every 6 (six) hours as needed for mild pain or headache (or Fever >/= 101). 12 tablet 0   allopurinol (ZYLOPRIM) 100 MG tablet Take 1 tablet (100 mg total) by mouth daily. 90 tablet 1   amLODipine (NORVASC) 2.5 MG tablet Take 1 tablet (2.5 mg total) by mouth daily. 90 tablet 3   apixaban (ELIQUIS) 2.5 MG TABS tablet Take 1 tablet (2.5 mg total) by mouth 2 (two) times daily. 180 tablet 1   atorvastatin (LIPITOR) 20 MG tablet Take 1 tablet by mouth once daily 90 tablet 0   dapagliflozin propanediol (FARXIGA) 10 MG TABS tablet Take 1 tablet (10 mg total) by mouth daily. 90 tablet 11   furosemide (LASIX) 20 MG tablet Take 1 tablet (20 mg total) by mouth daily. 90 tablet 2   GNP CRANBERRY EXTRACT PO Take 500 mg by mouth in the morning.      levothyroxine (SYNTHROID) 50 MCG tablet TAKE 1 TABLET BY MOUTH ONCE DAILY BEFORE BREAKFAST 90 tablet 3   lisinopril (ZESTRIL) 2.5 MG tablet Take 1 tablet (2.5 mg total) by mouth daily. 90 tablet 3   loratadine (CLARITIN) 10 MG tablet Take 10 mg by mouth daily.     oxybutynin (DITROPAN-XL) 10 MG 24 hr tablet Take 1 tablet (10 mg total) by mouth at bedtime. 90 tablet 1   pantoprazole (PROTONIX) 40 MG tablet Take 1 tablet (40 mg total) by mouth 2 (two) times daily before a meal. 180 tablet 1   polyethylene glycol (MIRALAX / GLYCOLAX) 17 g packet Take 17 g by mouth daily as needed for mild constipation. 28 each 1   vitamin B-12 (CYANOCOBALAMIN) 50 MCG tablet Take 50 mcg by mouth daily.     Vitamin D3 (VITAMIN D) 25 MCG tablet Take 1,000 Units by mouth daily.  No current facility-administered medications for this visit.    Allergies:   Patient has no known allergies.    ROS:  Please see the history of present illness.   Otherwise, review of systems are positive for weakness and fatigue.   All other systems are reviewed and negative.      PHYSICAL EXAM: VS:  BP 136/70   Pulse (!) 55   Ht '5\' 3"'$  (1.6 m)   Wt 202 lb 3.2 oz (91.7 kg)   SpO2 97%   BMI 35.82 kg/m  , BMI Body mass index is 35.82 kg/m. GENERAL:  Well appearing NECK:  No jugular venous distention, waveform within normal limits, carotid upstroke brisk and symmetric, no bruits, no thyromegaly LUNGS:  Clear to auscultation bilaterally CHEST:  Unremarkable HEART:  PMI not displaced or sustained,S1 and S2 within normal limits, no S3, no S4, no clicks, no rubs, no murmurs ABD:  Flat, positive bowel sounds normal in frequency in pitch, no bruits, no rebound, no guarding, no midline pulsatile mass, no hepatomegaly, no splenomegaly EXT:  2 plus pulses throughout, no edema, no cyanosis no clubbing   EKG:  EKG is  ordered today. The ekg ordered today demonstrates sinus bradycardia, rate 46 , premature atrial contractions, short PR  interval, no acute ST-T wave changes.   Recent Labs: 02/10/2022: Magnesium 2.3 05/09/2022: ALT 13; BUN 23; Creatinine, Ser 1.34; Hemoglobin 11.8; Platelets 246; Potassium 3.9; Sodium 143; TSH 2.340    Lipid Panel    Component Value Date/Time   CHOL 161 05/09/2022 1028   TRIG 145 05/09/2022 1028   HDL 50 05/09/2022 1028   CHOLHDL 3.2 05/09/2022 1028   CHOLHDL 3.7 02/07/2021 0528   VLDL 13 02/07/2021 0528   LDLCALC 86 05/09/2022 1028      Wt Readings from Last 3 Encounters:  06/01/22 202 lb 3.2 oz (91.7 kg)  05/09/22 203 lb 8 oz (92.3 kg)  04/28/22 200 lb (90.7 kg)      Other studies Reviewed: Additional studies/ records that were reviewed today include: Hospital records Review of the above records demonstrates:  Please see elsewhere in the note.     ASSESSMENT AND PLAN:  Mitral valve regurgitation with moderate mitral annular calcification:    This was stable on the echocardiogram done in September.  No change in therapy.   History of atrial fibrillation with RVR:   This is paroxysmal atrial fibrillation.  She seems to be maintaining sinus rhythm.  Of note her creatinine was elevated at so her dose of Eliquis was reduced.  However, seems to come back down.  If it remains below 1.5 I we will probably restart the 5 mg Eliquis twice daily.    History of systolic heart failure: Continue medical management.  No change in therapy.  HTN: Her blood pressure is well controlled.  Continue the meds as listed.  Current medicines are reviewed at length with the patient today.  The patient does not have concerns regarding medicines.  The following changes have been made: None  Labs/ tests ordered today include: None  No orders of the defined types were placed in this encounter.   Disposition:   FU with me in 6 months.     Signed, Minus Breeding, MD  06/01/2022 1:55 PM    [place]

## 2022-06-01 ENCOUNTER — Ambulatory Visit (INDEPENDENT_AMBULATORY_CARE_PROVIDER_SITE_OTHER): Payer: PPO | Admitting: Cardiology

## 2022-06-01 ENCOUNTER — Encounter: Payer: Self-pay | Admitting: Cardiology

## 2022-06-01 VITALS — BP 136/70 | HR 55 | Ht 63.0 in | Wt 202.2 lb

## 2022-06-01 DIAGNOSIS — I10 Essential (primary) hypertension: Secondary | ICD-10-CM | POA: Diagnosis not present

## 2022-06-01 DIAGNOSIS — I340 Nonrheumatic mitral (valve) insufficiency: Secondary | ICD-10-CM | POA: Diagnosis not present

## 2022-06-01 DIAGNOSIS — I480 Paroxysmal atrial fibrillation: Secondary | ICD-10-CM

## 2022-06-01 DIAGNOSIS — I5022 Chronic systolic (congestive) heart failure: Secondary | ICD-10-CM

## 2022-06-01 NOTE — Patient Instructions (Signed)
Medication Instructions:  The current medical regimen is effective;  continue present plan and medications.  *If you need a refill on your cardiac medications before your next appointment, please call your pharmacy*  Follow-Up: At [facility], you and your health needs are our priority.  As part of our continuing mission to provide you with exceptional heart care, we have created designated Provider Care Teams.  These Care Teams include your primary Cardiologist (physician) and Advanced Practice Providers (APPs -  Physician Assistants and Nurse Practitioners) who all work together to provide you with the care you need, when you need it.  We recommend signing up for the patient portal called "MyChart".  Sign up information is provided on this After Visit Summary.  MyChart is used to connect with patients for Virtual Visits (Telemedicine).  Patients are able to view lab/test results, encounter notes, upcoming appointments, etc.  Non-urgent messages can be sent to your provider as well.   To learn more about what you can do with MyChart, go to [email or web address].    Your next appointment:   3 month(s)  The format for your next appointment:   In Person  Provider:   Minus Breeding, MD     Important Information About Sugar

## 2022-06-02 NOTE — Telephone Encounter (Signed)
Disregard previous note - patient eligible for 2024 re-enrollment.

## 2022-06-23 IMAGING — DX DG SHOULDER 1V*L*
2 series · 2 of 2 positions shown · non-contrast
Comparison: 01/22/2020 at [DATE] p.m.

CLINICAL DATA: Dislocation status post reduction

EXAM:
LEFT SHOULDER

[shoulder ap]
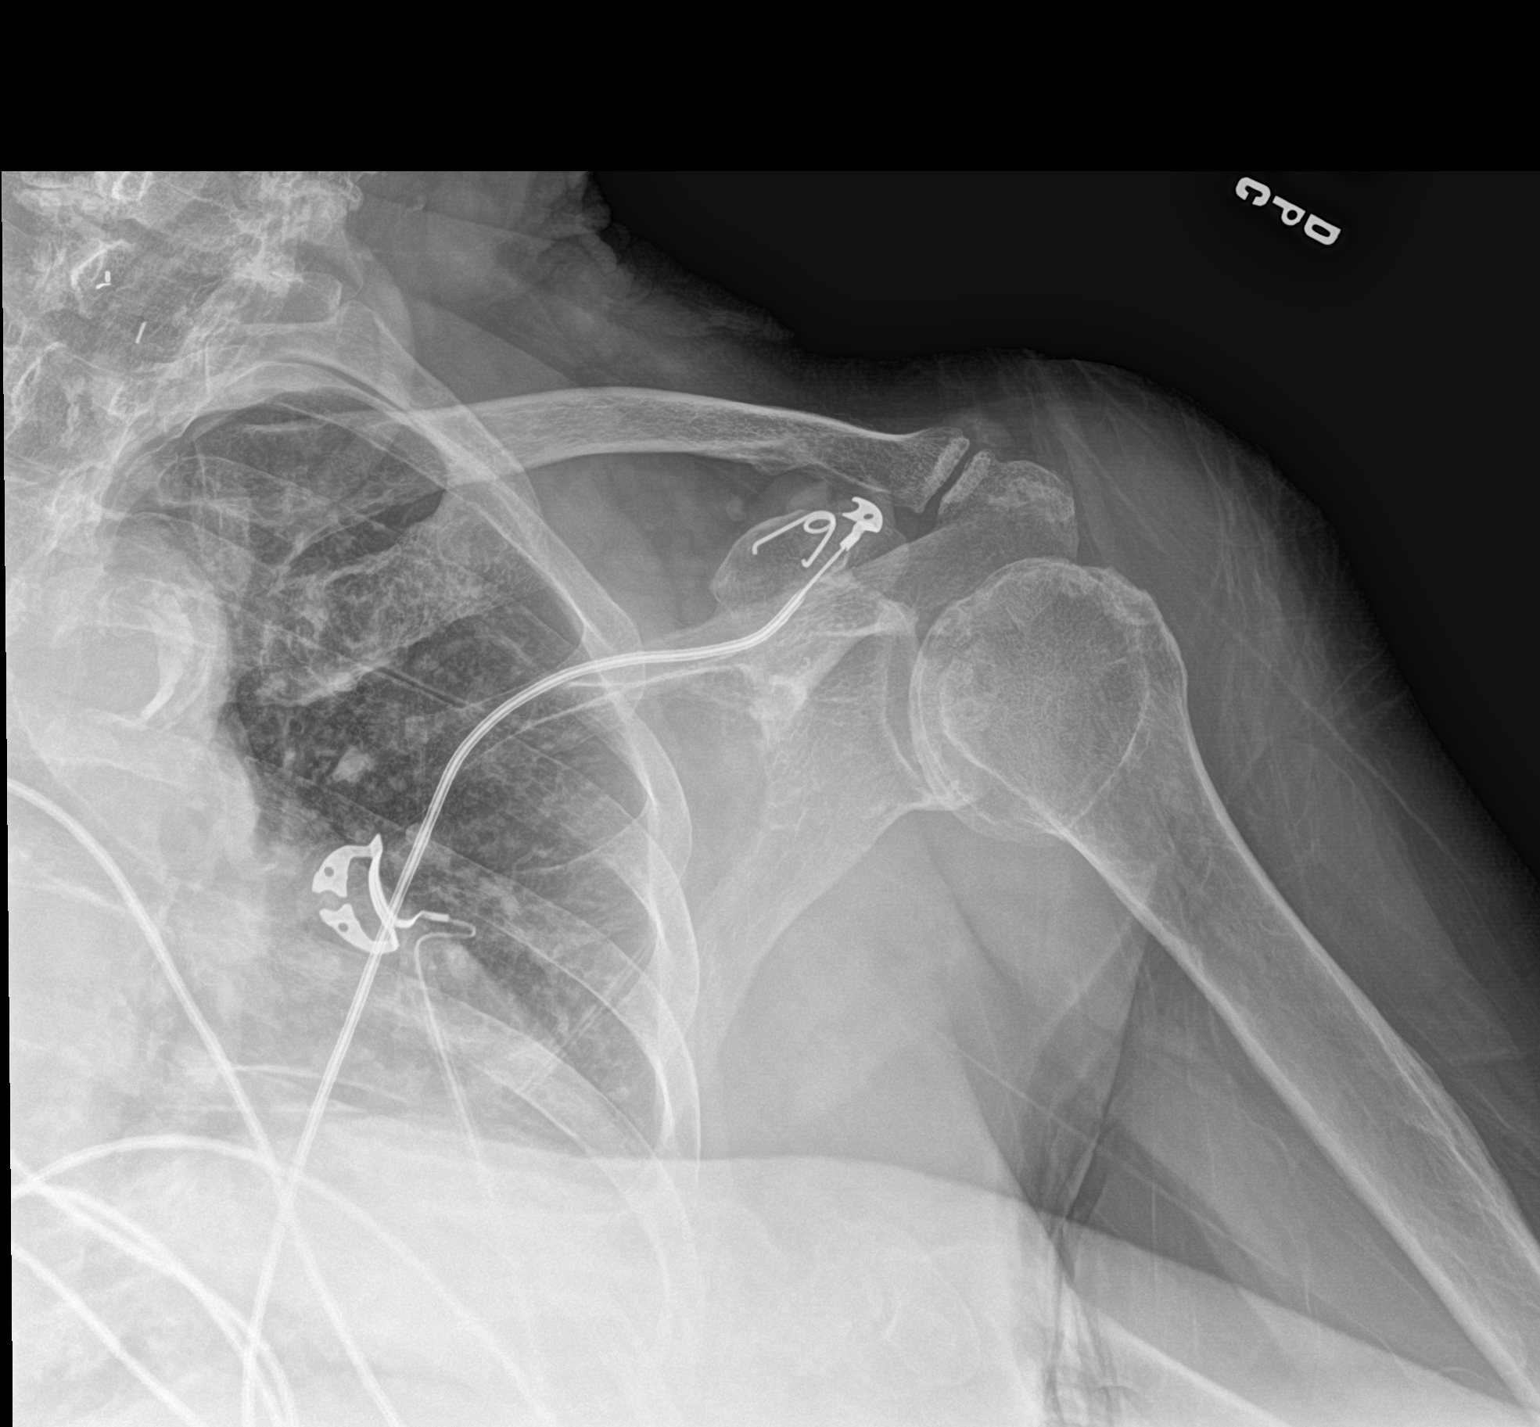

[scapula lat]
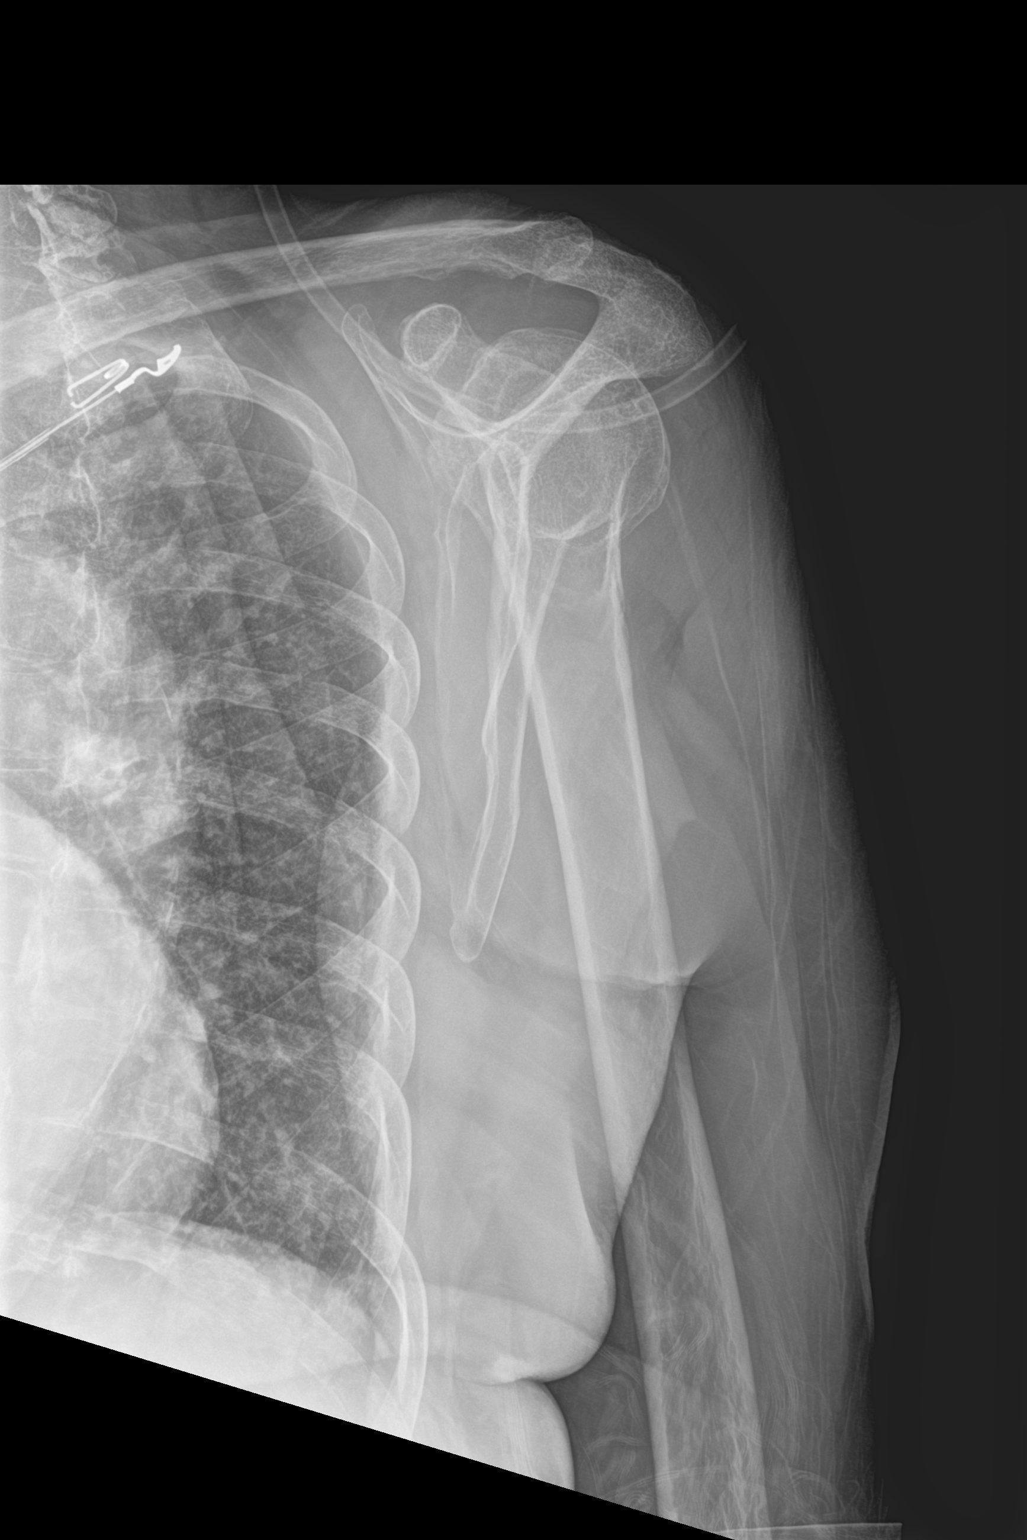

[2 of 2 positions shown; findings below may reference images not displayed]

FINDINGS: Frontal and transscapular views of the left shoulder demonstrate
anatomic positioning of the glenohumeral joint. Moderate
osteoarthritis of the glenohumeral and acromioclavicular joints are
noted. No displaced fracture. Left chest is clear.
IMPRESSION: 1. Anatomic alignment of the glenohumeral joint, with moderate
osteoarthritis.

## 2022-06-27 NOTE — Progress Notes (Unsigned)
[place], [place] [address]   CLINIC:  Medical Oncology/Hematology  PCP:  [name], [place] [address] [phone]   REASON FOR VISIT:  Follow-up for iron deficiency anemia  PRIOR THERAPY: Blood transfusions and oral iron tablets  CURRENT THERAPY: Intermittent IV iron  INTERVAL HISTORY:  [name] 81 y.o. female returns for routine follow-up of her iron deficiency anemia.  She was last seen by Tarri Abernethy PA-C on 04/28/2022.  She received blood transfusions, most recently on 02/24/2022 (Hgb 6.7) and again on 03/03/2022 (Hgb 6.6).  *** She most recently received Feraheme x2 on 05/02/2022 and 05/09/2022.  She reports improved energy and strength after her iron infusions and blood transfusions.  *** *** She had EGD (with APC treatment of GAVE) on 03/10/2022, and reports that she has not had as many black stools following her EGD. *** Energy is improved to 50%, not yet at baseline. Mild ice pica. *** Her dyspnea on exertion is persistent but improved. *** No restless legs, headaches, additional exertion, lightheadedness, or syncope.  No recent chest pain.  She is taking iron tablet once daily with mild constipation.  *** *** She remains on Eliquis 2.5 mg BID for history of unprovoked DVT and PE in 2020.    She has ***% energy and ***% appetite. She endorses that she is maintaining a stable weight.   REVIEW OF SYSTEMS: *** Review of Systems  Constitutional:  Positive for fatigue. Negative for appetite change, chills, diaphoresis, fever and unexpected weight change.  HENT:   Negative for lump/mass and nosebleeds.   Eyes:  Negative for eye problems.  Respiratory:  Positive for shortness of breath (with exertion). Negative for cough and hemoptysis.   Cardiovascular:  Negative for chest pain, leg swelling and palpitations.  Gastrointestinal:  Negative for abdominal pain, blood in stool, constipation, diarrhea,  nausea and vomiting.  Genitourinary:  Negative for hematuria.   Skin: Negative.   Neurological:  Negative for dizziness, headaches and light-headedness.  Hematological:  Does not bruise/bleed easily.      PAST MEDICAL/SURGICAL HISTORY:  Past Medical History:  Diagnosis Date   Anemia    Arthritis    Ostearthritis- hips, knees, fingers   Basal cell carcinoma (BCC) of right hand 10/2021   Dx by [facility]   DVT (deep venous thrombosis) (HCC)    Dyspnea    GERD (gastroesophageal reflux disease)    Headache(784.0)    tx. Valproic acid   History of diabetes mellitus    History of kidney stones    Hypertension    Hypothyroidism    Pulmonary embolism [facility])    Past Surgical History:  Procedure Laterality Date   ABDOMINAL HYSTERECTOMY     BILATERAL HIP ARTHROSCOPY Left    BIOPSY  12/08/2019   Procedure: BIOPSY;  Surgeon: Daneil Dolin, MD;  [facility];  Service: Endoscopy;;   CATARACT EXTRACTION, BILATERAL     COLONOSCOPY N/A 12/08/2019   polyps (tubular adenoma), diverticulosis, colonic lipoma, no surveillance due to age   54 [address]/ [place] Right 12/25/2020   Procedure: CYSTOSCOPY WITH RETROGRADE PYELOGRAM/URETERAL STENT PLACEMENT;  Surgeon: Janith Lima, MD;  Location: [facility];  Service: Urology;  Laterality: Right;   CYSTOSCOPY/URETEROSCOPY/HOLMIUM LASER/STENT PLACEMENT Right 01/15/2021   Procedure: CYSTOSCOPY/RETROGRADE/URETEROSCOPY/HOLMIUM LASER/STENT EXCHANGE;  Surgeon: Janith Lima, MD;  Location: [facility];  Service: Urology;  Laterality: Right;   ESOPHAGOGASTRODUODENOSCOPY N/A 12/08/2019   normal esophagus  with 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   ESOPHAGOGASTRODUODENOSCOPY (EGD) WITH PROPOFOL N/A 05/18/2021   Surgeon: Clarene Essex, MD;   Tiny hiatal hernia, gastric antral vascular ectasia s/p ablation, normal duodenum, normal jejunum.   ESOPHAGOGASTRODUODENOSCOPY (EGD) WITH PROPOFOL N/A 03/10/2022    Procedure: ESOPHAGOGASTRODUODENOSCOPY (EGD) WITH PROPOFOL;  Surgeon: Eloise Harman, DO;  [facility];  Service: Endoscopy;  Laterality: N/A;  10:45am   GIVENS CAPSULE STUDY N/A 01/13/2020   Procedure: GIVENS CAPSULE STUDY;  Surgeon: Daneil Dolin, MD;  [facility];  Service: Endoscopy;  Laterality: N/A;  7:30am   GIVENS CAPSULE STUDY N/A 05/04/2021   Procedure: GIVENS CAPSULE STUDY;  Surgeon: Clarene Essex, MD;  [facility];  Service: Endoscopy;  Laterality: N/A;   MULTIPLE TOOTH EXTRACTIONS     60's   PARATHYROIDECTOMY     POLYPECTOMY  12/08/2019   Procedure: POLYPECTOMY;  Surgeon: Daneil Dolin, MD;  [facility];  Service: Endoscopy;;   RADIOFREQUENCY ABLATION  05/18/2021   Procedure: RADIO FREQUENCY ABLATION;  Surgeon: Clarene Essex, MD;  [facility];  Service: Endoscopy;;   TEE WITHOUT CARDIOVERSION N/A 07/06/2021   Procedure: TRANSESOPHAGEAL ECHOCARDIOGRAM (TEE);  Surgeon: Donato Heinz, MD;  Location: [facility] ENDOSCOPY;  Service: Cardiovascular;  Laterality: N/A;   TOTAL HIP ARTHROPLASTY Right 10/29/2013   Procedure: RIGHT TOTAL HIP ARTHROPLASTY ANTERIOR APPROACH;  Surgeon: Mauri Pole, MD;  Location: [facility];  Service: Orthopedics;  Laterality: Right;     SOCIAL HISTORY:  Social History   Socioeconomic History   Marital status: Widowed    Spouse name: Not on file   Number of children: 4   Years of education: 12   Highest education level: High school graduate  Occupational History   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 She has four  adult children and one son lives with her.       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   Social Determinants of Health   Financial Resource Strain: High Risk (03/11/2022)   Overall Financial Resource Strain (CARDIA)    Difficulty of Paying Living Expenses: Very hard  Food Insecurity: No Food Insecurity (04/18/2022)   Hunger Vital Sign    Worried About Running Out of Food in the Last Year: Never true    Ran Out of Food in the Last Year: Never true  Transportation Needs: No Transportation Needs (04/18/2022)   PRAPARE - Hydrologist (Medical): No    Lack of Transportation (Non-Medical): No  Physical Activity: Insufficiently Active (12/23/2021)   Exercise Vital Sign    Days of Exercise per Week: 7 days    Minutes of Exercise per Session: 20 min  Stress: No Stress Concern Present (03/04/2022)   [place]    Feeling of Stress : Only a little  Social Connections: Moderately Integrated (12/23/2021)   Social Connection and Isolation Panel [NHANES]    Frequency of Communication with Friends  and Family: More than three times a week    Frequency of Social Gatherings with Friends and Family: More than three times a week    Attends Religious Services: More than 4 times per year    Active Member of Clubs or Organizations: Yes    Attends Archivist Meetings: More than 4 times per year    Marital Status: Widowed  Intimate Partner Violence: Not At Risk (03/04/2022)   Humiliation, Afraid, Rape, and Kick questionnaire    Fear of Current or Ex-Partner: No    Emotionally Abused: No    Physically Abused: No    Sexually Abused: No    FAMILY HISTORY:  Family History  Problem Relation Age of Onset   Colitis Sister 44       alive   Cancer Sister 28       colon   Cancer Mother 87       uterine   Heart disease Father 44       heart failure   Heart attack Brother 78   Healthy Daughter    Healthy Son     Pulmonary embolism Brother 46   Heart disease Brother    Healthy Son    Healthy Son     CURRENT MEDICATIONS:  Outpatient Encounter Medications as of 06/28/2022  Medication Sig   acetaminophen (TYLENOL) 325 MG tablet Take 2 tablets (650 mg total) by mouth every 6 (six) hours as needed for mild pain or headache (or Fever >/= 101).   allopurinol (ZYLOPRIM) 100 MG tablet Take 1 tablet (100 mg total) by mouth daily.   amLODipine (NORVASC) 2.5 MG tablet Take 1 tablet (2.5 mg total) by mouth daily.   apixaban (ELIQUIS) 2.5 MG TABS tablet Take 1 tablet (2.5 mg total) by mouth 2 (two) times daily.   atorvastatin (LIPITOR) 20 MG tablet Take 1 tablet by mouth once daily   dapagliflozin propanediol (FARXIGA) 10 MG TABS tablet Take 1 tablet (10 mg total) by mouth daily.   furosemide (LASIX) 20 MG tablet Take 1 tablet (20 mg total) by mouth daily.   GNP CRANBERRY EXTRACT PO Take 500 mg by mouth in the morning.   levothyroxine (SYNTHROID) 50 MCG tablet TAKE 1 TABLET BY MOUTH ONCE DAILY BEFORE BREAKFAST   lisinopril (ZESTRIL) 2.5 MG tablet Take 1 tablet (2.5 mg total) by mouth daily.   loratadine (CLARITIN) 10 MG tablet Take 10 mg by mouth daily.   oxybutynin (DITROPAN-XL) 10 MG 24 hr tablet Take 1 tablet (10 mg total) by mouth at bedtime.   pantoprazole (PROTONIX) 40 MG tablet Take 1 tablet (40 mg total) by mouth 2 (two) times daily before a meal.   polyethylene glycol (MIRALAX / GLYCOLAX) 17 g packet Take 17 g by mouth daily as needed for mild constipation.   vitamin B-12 (CYANOCOBALAMIN) 50 MCG tablet Take 50 mcg by mouth daily.   Vitamin D3 (VITAMIN D) 25 MCG tablet Take 1,000 Units by mouth daily.   No facility-administered encounter medications on file as of 06/28/2022.    ALLERGIES:  No Known Allergies   PHYSICAL EXAM: *** ECOG PERFORMANCE STATUS: 2 - Symptomatic, <50% confined to bed  There were no vitals filed for this visit. There were no vitals filed for this visit. Physical  Exam Constitutional:      Appearance: Normal appearance. She is obese.  HENT:     Head: Normocephalic and atraumatic.     Mouth/Throat:     Mouth: Mucous membranes are moist.  Eyes:  Extraocular Movements: Extraocular movements intact.     Pupils: Pupils are equal, round, and reactive to light.  Cardiovascular:     Rate and Rhythm: Bradycardia present. Rhythm irregular.     Pulses: Normal pulses.     Heart sounds: Normal heart sounds.  Pulmonary:     Effort: Pulmonary effort is normal.     Breath sounds: Normal breath sounds.  Abdominal:     General: Bowel sounds are normal.     Palpations: Abdomen is soft.     Tenderness: There is no abdominal tenderness.  Musculoskeletal:        General: No swelling.     Right lower leg: Edema (trace) present.     Left lower leg: Edema (trace) present.  Lymphadenopathy:     Cervical: No cervical adenopathy.  Skin:    General: Skin is warm and dry.  Neurological:     General: No focal deficit present.     Mental Status: She is alert and oriented to person, place, and time.  Psychiatric:        Mood and Affect: Mood normal.        Behavior: Behavior normal.     LABORATORY DATA:  I have reviewed the labs as listed.  CBC    Component Value Date/Time   WBC 4.8 05/09/2022 1028   WBC 4.5 04/28/2022 0841   RBC 3.98 05/09/2022 1028   RBC 3.83 (L) 04/28/2022 0841   HGB 11.8 05/09/2022 1028   HCT 38.3 05/09/2022 1028   PLT 246 05/09/2022 1028   MCV 96 05/09/2022 1028   MCH 29.6 05/09/2022 1028   MCH 29.5 04/28/2022 0841   MCHC 30.8 (L) 05/09/2022 1028   MCHC 29.9 (L) 04/28/2022 0841   RDW 13.6 05/09/2022 1028   LYMPHSABS 1.0 05/09/2022 1028   MONOABS 0.7 04/28/2022 0841   EOSABS 0.2 05/09/2022 1028   BASOSABS 0.0 05/09/2022 1028      Latest Ref Rng & Units 05/09/2022   10:28 AM 02/17/2022   12:15 PM 02/11/2022    3:38 AM  CMP  Glucose 70 - 99 mg/dL 96  109  109   BUN 8 - 27 mg/dL '23  18  25   '$ Creatinine 0.57 - 1.00 mg/dL  1.34  1.15  1.11   Sodium 134 - 144 mmol/L 143  139  137   Potassium 3.5 - 5.2 mmol/L 3.9  4.5  3.7   Chloride 96 - 106 mmol/L 108  104  106   CO2 20 - 29 mmol/L '22  22  25   '$ Calcium 8.7 - 10.3 mg/dL 11.2  10.5  9.3   Total Protein 6.0 - 8.5 g/dL 6.3     Total Bilirubin 0.0 - 1.2 mg/dL 0.2     Alkaline Phos 44 - 121 IU/L 109     AST 0 - 40 IU/L 14     ALT 0 - 32 IU/L 13       DIAGNOSTIC IMAGING:  I have independently reviewed the relevant imaging and discussed with the patient.  ASSESSMENT & PLAN: 1.  Iron deficiency anemia secondary to GI blood loss - Normocytic anemia secondary to chronic blood loss, may also have some anemia related to CKD stage IIIb - Hematology work-up (02/24/2022):  Ferritin 11, iron saturation 7%.  Normal folate, B12, MMA, copper. Reticulocytes 3.9% (consistent with acute blood loss), LDH normal, haptoglobin normal, DAT negative SPEP negative for monoclonal protein, but showed elevation in acute phase proteins - She has required  intermittent blood transfusions, most recently on 02/24/2022 (Hgb 6.7) and again on 03/03/2022 (Hgb 6.6)*** - Most recent EGD (03/10/2022): GAVE without bleeding, treated with APC.  Normal duodenum and jejunum. - Colonoscopy (12/08/2019): Diverticulosis and polyps - She reports dark black bowel movements that improved after her EGD with APC treatment of GAVE*** - She is on Eliquis 2.5 mg twice daily.  She takes iron tablet once daily.   - Most recent IV Feraheme on 05/02/2022 and 05/09/2022 - She was symptomatic with severe fatigue and ice pica, which improved after IV iron and blood transfusions*** - Labs today (06/28/2022): *** - PLAN: Recommend additional IV iron with Feraheme x2.  No blood transfusion today.*** - Continue iron tablet daily. - Repeat CBC and iron panel with RTC in 8 weeks.*** - Continue GI follow-up.  2.   Unprovoked right leg DVT and PE: - Doppler on 04/03/2019: DVT in the femoral vein and calf veins. - CT chest PE  protocol (04/03/2019): Moderate size pulmonary emboli in the right lower lobe. - She was treated with Eliquis full dose, currently dose reduced to 2.5 mg twice daily.  3.  Social/family history: - She lives at home with her older son.  She takes care of the garden and does laundry and her ADLs and IADLs.  But lately her energy levels are low and she is not able to do them.  She was a retired tobacco former.  Never smoked. - Sister had anemia.  Mother had uterine cancer and sister had colon cancer.   All questions were answered. The patient knows to call the clinic with any problems, questions or concerns.  Medical decision making: Low***  Time spent on visit: I spent *** minutes counseling the patient face to face. The total time spent in the appointment was *** minutes and more than 50% was on counseling.   Harriett Rush, PA-C  ***

## 2022-06-28 ENCOUNTER — Inpatient Hospital Stay: Payer: PPO

## 2022-06-28 ENCOUNTER — Other Ambulatory Visit: Payer: Self-pay

## 2022-06-28 ENCOUNTER — Inpatient Hospital Stay: Payer: PPO | Attending: Physician Assistant | Admitting: Physician Assistant

## 2022-06-28 VITALS — BP 172/73 | HR 81 | Temp 97.2°F | Resp 17 | Ht 63.0 in | Wt 201.0 lb

## 2022-06-28 DIAGNOSIS — K219 Gastro-esophageal reflux disease without esophagitis: Secondary | ICD-10-CM | POA: Diagnosis not present

## 2022-06-28 DIAGNOSIS — Z79890 Hormone replacement therapy (postmenopausal): Secondary | ICD-10-CM | POA: Diagnosis not present

## 2022-06-28 DIAGNOSIS — E039 Hypothyroidism, unspecified: Secondary | ICD-10-CM | POA: Insufficient documentation

## 2022-06-28 DIAGNOSIS — Z800 Family history of malignant neoplasm of digestive organs: Secondary | ICD-10-CM | POA: Diagnosis not present

## 2022-06-28 DIAGNOSIS — Z79899 Other long term (current) drug therapy: Secondary | ICD-10-CM | POA: Insufficient documentation

## 2022-06-28 DIAGNOSIS — Z85828 Personal history of other malignant neoplasm of skin: Secondary | ICD-10-CM | POA: Insufficient documentation

## 2022-06-28 DIAGNOSIS — I10 Essential (primary) hypertension: Secondary | ICD-10-CM | POA: Insufficient documentation

## 2022-06-28 DIAGNOSIS — D500 Iron deficiency anemia secondary to blood loss (chronic): Secondary | ICD-10-CM

## 2022-06-28 DIAGNOSIS — Z8041 Family history of malignant neoplasm of ovary: Secondary | ICD-10-CM | POA: Insufficient documentation

## 2022-06-28 DIAGNOSIS — Z7901 Long term (current) use of anticoagulants: Secondary | ICD-10-CM | POA: Diagnosis not present

## 2022-06-28 DIAGNOSIS — K5900 Constipation, unspecified: Secondary | ICD-10-CM | POA: Diagnosis not present

## 2022-06-28 DIAGNOSIS — Z86718 Personal history of other venous thrombosis and embolism: Secondary | ICD-10-CM | POA: Diagnosis not present

## 2022-06-28 DIAGNOSIS — R0602 Shortness of breath: Secondary | ICD-10-CM | POA: Insufficient documentation

## 2022-06-28 DIAGNOSIS — Z86711 Personal history of pulmonary embolism: Secondary | ICD-10-CM | POA: Diagnosis not present

## 2022-06-28 LAB — CBC WITH DIFFERENTIAL/PLATELET
Abs Immature Granulocytes: 0.02 10*3/uL (ref 0.00–0.07)
Basophils Absolute: 0 10*3/uL (ref 0.0–0.1)
Basophils Relative: 1 %
Eosinophils Absolute: 0.2 10*3/uL (ref 0.0–0.5)
Eosinophils Relative: 4 %
HCT: 36.9 % (ref 36.0–46.0)
Hemoglobin: 11.8 g/dL — ABNORMAL LOW (ref 12.0–15.0)
Immature Granulocytes: 0 %
Lymphocytes Relative: 23 %
Lymphs Abs: 1.3 10*3/uL (ref 0.7–4.0)
MCH: 29.5 pg (ref 26.0–34.0)
MCHC: 32 g/dL (ref 30.0–36.0)
MCV: 92.3 fL (ref 80.0–100.0)
Monocytes Absolute: 0.7 10*3/uL (ref 0.1–1.0)
Monocytes Relative: 13 %
Neutro Abs: 3.4 10*3/uL (ref 1.7–7.7)
Neutrophils Relative %: 59 %
Platelets: 212 10*3/uL (ref 150–400)
RBC: 4 MIL/uL (ref 3.87–5.11)
RDW: 15.1 % (ref 11.5–15.5)
WBC: 5.7 10*3/uL (ref 4.0–10.5)
nRBC: 0 % (ref 0.0–0.2)

## 2022-06-28 LAB — IRON AND TIBC
Iron: 36 ug/dL (ref 28–170)
Saturation Ratios: 10 % — ABNORMAL LOW (ref 10.4–31.8)
TIBC: 356 ug/dL (ref 250–450)
UIBC: 320 ug/dL

## 2022-06-28 LAB — FERRITIN: Ferritin: 19 ng/mL (ref 11–307)

## 2022-06-28 NOTE — Patient Instructions (Signed)
[facility] [place] **   You were seen today by Tarri Abernethy PA-C for your iron deficiency anemia.    IRON DEFICIENCY ANEMIA: This is from your chronic blood loss from your stomach. After today's visit, you we will have received 2 units of IV iron. We will recheck your iron levels and see you for an office visit in 3 months. Continue iron tablet ONCE daily.   ** Thank you for trusting me with your healthcare!  I strive to provide all of my patients with quality care at each visit.  If you receive a survey for this visit, I would be so grateful to you for taking the time to provide feedback.  Thank you in advance!  ~ Dazha Kempa                   Dr. Derek Jack   &   Tarri Abernethy, PA-C   - - - - - - - - - - - - - - - - - -    Thank you for choosing [facility] to provide your oncology and hematology care.  To afford each patient quality time with our provider, please arrive at least 15 minutes before your scheduled appointment time.   If you have a lab appointment with the [facility] please come in thru the Main Entrance and check in at the main information desk.  You need to re-schedule your appointment should you arrive 10 or more minutes late.  We strive to give you quality time with our providers, and arriving late affects you and other patients whose appointments are after yours.  Also, if you no show three or more times for appointments you may be dismissed from the clinic at the providers discretion.     Again, thank you for choosing [facility]  Our hope is that these requests will decrease the amount of time that you wait before being seen by our physicians.       _____________________________________________________________  Should you have questions after your visit to [facility], please contact our office at [phone] and  follow the prompts.  Our office hours are 8:00 a.m. and 4:30 p.m. Monday - Friday.  Please note that voicemails left after 4:00 p.m. may not be returned until the following business day.  We are closed weekends and major holidays.  You do have access to a nurse 24-7, just call the main number to the clinic [phone] and do not press any options, hold on the line and a nurse will answer the phone.    For prescription refill requests, have your pharmacy contact our office and allow 72 hours.

## 2022-07-05 ENCOUNTER — Inpatient Hospital Stay: Payer: PPO

## 2022-07-05 VITALS — BP 157/54 | HR 40 | Temp 96.4°F | Resp 18

## 2022-07-05 DIAGNOSIS — D500 Iron deficiency anemia secondary to blood loss (chronic): Secondary | ICD-10-CM

## 2022-07-05 MED ORDER — SODIUM CHLORIDE 0.9 % IV SOLN: Freq: Once | INTRAVENOUS | Status: AC

## 2022-07-05 MED ORDER — SODIUM CHLORIDE 0.9 % IV SOLN
510.0000 mg | Freq: Once | INTRAVENOUS | Status: AC
  Administered 2022-07-05: 510 mg via INTRAVENOUS
  Filled 2022-07-05: qty 510

## 2022-07-05 NOTE — Progress Notes (Signed)
Patient presents today for iron infusion.  Patient is in satisfactory condition with no complaints voiced.  Vital signs are stable.  Claritin was taken at 0900 and Tylenol was taken at 1400 at home prior to visit.  We will proceed with infusion per provider orders.   Patient tolerated treatment well with no complaints voiced.  Patient left via wheelchair with daughter in stable condition.  Vital signs stable at discharge.  Follow up as scheduled.

## 2022-07-05 NOTE — Patient Instructions (Signed)
[facility] [place] [facility] [place]  Discharge Instructions: Thank you for choosing [facility] to provide your oncology and hematology care.  If you have a lab appointment with the Cancer Center, please come in thru the Main Entrance and check in at the main information desk.  Wear comfortable clothing and clothing appropriate for easy access to any Portacath or PICC line.   We strive to give you quality time with your provider. You may need to reschedule your appointment if you arrive late (15 or more minutes).  Arriving late affects you and other patients whose appointments are after yours.  Also, if you miss three or more appointments without notifying the office, you may be dismissed from the clinic at the provider's discretion.      For prescription refill requests, have your pharmacy contact our office and allow 72 hours for refills to be completed.     To help prevent nausea and vomiting after your treatment, we encourage you to take your nausea medication as directed.  BELOW ARE SYMPTOMS THAT SHOULD BE REPORTED IMMEDIATELY: *FEVER GREATER THAN 100.4 F (38 C) OR HIGHER *CHILLS OR SWEATING *NAUSEA AND VOMITING THAT IS NOT CONTROLLED WITH YOUR NAUSEA MEDICATION *UNUSUAL SHORTNESS OF BREATH *UNUSUAL BRUISING OR BLEEDING *URINARY PROBLEMS (pain or burning when urinating, or frequent urination) *BOWEL PROBLEMS (unusual diarrhea, constipation, pain near the anus) TENDERNESS IN MOUTH AND THROAT WITH OR WITHOUT PRESENCE OF ULCERS (sore throat, sores in mouth, or a toothache) UNUSUAL RASH, SWELLING OR PAIN  UNUSUAL VAGINAL DISCHARGE OR ITCHING   Items with * indicate a potential emergency and should be followed up as soon as possible or go to the Emergency Department if any problems should occur.  Please show the CHEMOTHERAPY ALERT CARD or IMMUNOTHERAPY ALERT CARD at check-in to the Emergency Department and triage nurse.  Should you have questions after your visit or need to  cancel or reschedule your appointment, please contact [facility] [phone]  and follow the prompts.  Office hours are 8:00 a.m. to 4:30 p.m. Monday - Friday. Please note that voicemails left after 4:00 p.m. may not be returned until the following business day.  We are closed weekends and major holidays. You have access to a nurse at all times for urgent questions. Please call the main number to the clinic [phone] and follow the prompts.  For any non-urgent questions, you may also contact your provider using MyChart. We now offer e-Visits for anyone 18 and older to request care online for non-urgent symptoms. For details visit [email or web address].   Also download the MyChart app! Go to the app store, search "MyChart", open the app, select [facility], and log in with your MyChart username and password.  Masks are optional in the cancer centers. If you would like for your care team to wear a mask while they are taking care of you, please let them know. You may have one support person who is at least 80 years old accompany you for your appointments.  

## 2022-07-08 ENCOUNTER — Ambulatory Visit (INDEPENDENT_AMBULATORY_CARE_PROVIDER_SITE_OTHER): Payer: PPO | Admitting: Internal Medicine

## 2022-07-08 ENCOUNTER — Encounter: Payer: Self-pay | Admitting: Internal Medicine

## 2022-07-08 VITALS — BP 120/80 | HR 60 | Ht 63.0 in | Wt 203.0 lb

## 2022-07-08 DIAGNOSIS — E210 Primary hyperparathyroidism: Secondary | ICD-10-CM | POA: Diagnosis not present

## 2022-07-08 LAB — VITAMIN D 25 HYDROXY (VIT D DEFICIENCY, FRACTURES): VITD: 58.76 ng/mL (ref 30.00–100.00)

## 2022-07-08 LAB — BASIC METABOLIC PANEL
BUN: 26 mg/dL — ABNORMAL HIGH (ref 6–23)
CO2: 27 mEq/L (ref 19–32)
Calcium: 12 mg/dL — ABNORMAL HIGH (ref 8.4–10.5)
Chloride: 107 mEq/L (ref 96–112)
Creatinine, Ser: 1.18 mg/dL (ref 0.40–1.20)
GFR: 43.51 mL/min — ABNORMAL LOW (ref >60.00)
Glucose, Bld: 105 mg/dL — ABNORMAL HIGH (ref 70–99)
Potassium: 3.9 mEq/L (ref 3.5–5.1)
Sodium: 141 mEq/L (ref 135–145)

## 2022-07-08 LAB — ALBUMIN: Albumin: 4.3 g/dL (ref 3.5–5.2)

## 2022-07-08 NOTE — Patient Instructions (Signed)
Stay Hydrated  Avoid over the counter calcium tablets  Consume 2-3 servings of dietary calcium daily ( low fat dairy/green leafy vegetables)

## 2022-07-08 NOTE — Progress Notes (Unsigned)
Name: [name] [name]  MRN/ DOB: [id number], 05-25-1941    Age/ Sex: 81 y.o., female    PCP: [name], FNP   Reason for Endocrinology Evaluation: Elevated PTH     Date of Initial Endocrinology Evaluation: 10/04/2021    HPI: [name] [name] is a 81 y.o. female with a past medical history of Dyslipidemia, Hypothyroidism , CHF and A.Fib . The patient presented for initial endocrinology clinic visit on 10/04/2021 for consultative assistance with her Elevated PTH.   Pt has been noted with elevated PTH during routine labs 05/10/2021 at 103 pg/mL .   Pt has been noted  with hypercalcemia intermittently since 2012 with a max level of 11.9 mg/dL (uncorrected ) 09/2020.   She has experienced symptoms of constipation, polyuria. She denies use of over the counter calcium (including supplements, Tums, Rolaids, or other calcium containing antacids), lithium, HCTZ but she is on Furosemide   She is on  vitamin D 5000 iu daily   She does have history of kidney stones ( 12/2020) , CKD III but no  liver disease, granulomatous disease. She denies osteoporosis (DXA 08/2017 with a T-score of -2.4 at  distal 1/3rd forearm) , nor prior fractures. Daily dietary calcium intake: 1-2 servings. She denies family history of osteoporosis.   Son with  parathyroid disease Mother with questionable thyroid disease.   She is S/P right inferior parathyroidectomy 1999    DXA T score -2.4 at distal radius 10/2021   She was noted with hypercalciuria at 255 mg 12/2021 , I had started her on HCTZ while on furosemide for CHF but this was discontinued due to hypokalemia   SUBJECTIVE:    Today (07/08/22): [name] [name] is here for hyperparathyroidism. She is accompanied by her son Sherren Mocha  She was admitted in 01/2022 for severe sepsis  due to CAP She is S/p EGD 02/2022 She is on iron infusions due to anemia.   She has polydipsia  Has frequency  Has chronic  constipation  She had a fall but no  fracture   No OTC calcium  She is on Vitamin D 1000 iu daily    HISTORY:  Past Medical History:  Past Medical History:  Diagnosis Date   Anemia    Arthritis    Ostearthritis- hips, knees, fingers   Basal cell carcinoma (BCC) of right hand 10/2021   Dx by [facility]   DVT (deep venous thrombosis) (HCC)    Dyspnea    GERD (gastroesophageal reflux disease)    Headache(784.0)    tx. Valproic acid   History of diabetes mellitus    History of kidney stones    Hypertension    Hypothyroidism    Pulmonary embolism [facility])    Past Surgical History:  Past Surgical History:  Procedure Laterality Date   ABDOMINAL HYSTERECTOMY     BILATERAL HIP ARTHROSCOPY Left    BIOPSY  12/08/2019   Procedure: BIOPSY;  Surgeon: Daneil Dolin, MD;  [facility];  Service: Endoscopy;;   CATARACT EXTRACTION, BILATERAL     COLONOSCOPY N/A 12/08/2019   polyps (tubular adenoma), diverticulosis, colonic lipoma, no surveillance due to age   41 W/ [place] Right 12/25/2020   Procedure: CYSTOSCOPY WITH RETROGRADE PYELOGRAM/URETERAL STENT PLACEMENT;  Surgeon: Janith Lima, MD;  [facility]: [facility];  Service: Urology;  Laterality: Right;   CYSTOSCOPY/URETEROSCOPY/HOLMIUM LASER/STENT PLACEMENT Right 01/15/2021   Procedure: CYSTOSCOPY/RETROGRADE/URETEROSCOPY/HOLMIUM LASER/STENT EXCHANGE;  Surgeon: Janith Lima, MD;  Location: [facility];  Service: Urology;  Laterality: Right;   ESOPHAGOGASTRODUODENOSCOPY N/A 12/08/2019   normal esophagus with 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   ESOPHAGOGASTRODUODENOSCOPY (EGD) WITH PROPOFOL N/A 05/18/2021   Surgeon: Clarene Essex, MD;   Tiny hiatal hernia, gastric antral vascular ectasia s/p ablation, normal duodenum, normal jejunum.   ESOPHAGOGASTRODUODENOSCOPY (EGD) WITH PROPOFOL N/A 03/10/2022   Procedure: ESOPHAGOGASTRODUODENOSCOPY (EGD) WITH PROPOFOL;  Surgeon: Eloise Harman, DO;  [facility];  Service: Endoscopy;  Laterality: N/A;  10:45am   GIVENS CAPSULE STUDY N/A 01/13/2020   Procedure: GIVENS CAPSULE STUDY;  Surgeon: Daneil Dolin, MD;  [facility];  Service: Endoscopy;  Laterality: N/A;  7:30am   GIVENS CAPSULE STUDY N/A 05/04/2021   Procedure: GIVENS CAPSULE STUDY;  Surgeon: Clarene Essex, MD;  [facility];  Service: Endoscopy;  Laterality: N/A;   MULTIPLE TOOTH EXTRACTIONS     60's   PARATHYROIDECTOMY     POLYPECTOMY  12/08/2019   Procedure: POLYPECTOMY;  Surgeon: Daneil Dolin, MD;  [facility];  Service: Endoscopy;;   RADIOFREQUENCY ABLATION  05/18/2021   Procedure: RADIO FREQUENCY ABLATION;  Surgeon: Clarene Essex, MD;  [facility];  Service: Endoscopy;;   TEE WITHOUT CARDIOVERSION N/A 07/06/2021   Procedure: TRANSESOPHAGEAL ECHOCARDIOGRAM (TEE);  Surgeon: Donato Heinz, MD;  Location: [facility] ENDOSCOPY;  Service: Cardiovascular;  Laterality: N/A;   TOTAL HIP ARTHROPLASTY Right 10/29/2013   Procedure: RIGHT TOTAL HIP ARTHROPLASTY ANTERIOR APPROACH;  Surgeon: Mauri Pole, MD;  Location[facility];  Service: Orthopedics;  Laterality: Right;    Social History:  reports that she has never smoked. She has never used smokeless tobacco. She reports that she does not drink alcohol and does not use drugs. Family History: family history includes Cancer (age of onset: 94) in her sister; Cancer (age of onset: 94) in her mother; Colitis (age of onset: 18) in her sister; Healthy in her daughter, son, son, and son; Heart attack (age of onset: 55) in her brother; Heart disease in her brother; Heart disease (age of onset: 28) in her father; Pulmonary embolism (age of onset: 65) in her brother.   HOME MEDICATIONS: Allergies as of 07/08/2022   No Known Allergies      Medication List        Accurate as of July 08, 2022 10:40 AM. If you have any questions, ask your nurse or doctor.          acetaminophen 325 MG  tablet Commonly known as: TYLENOL Take 2 tablets (650 mg total) by mouth every 6 (six) hours as needed for mild pain or headache (or Fever >/= 101).   allopurinol 100 MG tablet Commonly known as: ZYLOPRIM Take 1 tablet (100 mg total) by mouth daily.   amLODipine 2.5 MG tablet Commonly known as: NORVASC Take 1 tablet (2.5 mg total) by mouth daily.   apixaban 2.5 MG Tabs tablet Commonly known as: ELIQUIS Take 1 tablet (2.5 mg total) by mouth 2 (two) times daily.   atorvastatin 20 MG tablet Commonly known as: LIPITOR Take 1 tablet by mouth once daily   dapagliflozin propanediol 10 MG Tabs tablet Commonly known as: Farxiga Take 1 tablet (10 mg total) by mouth daily.   furosemide 20 MG tablet Commonly known as: Lasix Take 1 tablet (20 mg total) by mouth daily.   GNP CRANBERRY EXTRACT PO Take 500 mg by mouth in the morning.   levothyroxine 50 MCG tablet Commonly known as:  SYNTHROID TAKE 1 TABLET BY MOUTH ONCE DAILY BEFORE BREAKFAST   lisinopril 2.5 MG tablet Commonly known as: Zestril Take 1 tablet (2.5 mg total) by mouth daily.   loratadine 10 MG tablet Commonly known as: CLARITIN Take 10 mg by mouth daily.   oxybutynin 10 MG 24 hr tablet Commonly known as: DITROPAN-XL Take 1 tablet (10 mg total) by mouth at bedtime.   pantoprazole 40 MG tablet Commonly known as: PROTONIX Take 1 tablet (40 mg total) by mouth 2 (two) times daily before a meal.   polyethylene glycol 17 g packet Commonly known as: MIRALAX / GLYCOLAX Take 17 g by mouth daily as needed for mild constipation.   vitamin B-12 50 MCG tablet Commonly known as: CYANOCOBALAMIN Take 50 mcg by mouth daily.   vitamin D3 25 MCG tablet Commonly known as: CHOLECALCIFEROL Take 1,000 Units by mouth daily.          REVIEW OF SYSTEMS: A comprehensive ROS was conducted with the patient and is negative except as per HPI    OBJECTIVE:  VS: There were no vitals taken for this visit.   Wt Readings from  Last 3 Encounters:  06/28/22 201 lb (91.2 kg)  06/01/22 202 lb 3.2 oz (91.7 kg)  05/09/22 203 lb 8 oz (92.3 kg)     EXAM: General: Pt appears well and is in NAD  Neck: General: Supple without adenopathy. Thyroid: Thyroid size normal.  No goiter or nodules appreciated.   Lungs: Clear with good BS bilat with no rales, rhonchi, or wheezes  Heart: Auscultation: RRR.  Abdomen: Normoactive bowel sounds, soft, nontender, without masses or organomegaly palpable  Extremities:  BL LE: No pretibial edema normal ROM and strength.  Mental Status: Judgment, insight: Intact Orientation: Oriented to time, place, and person Mood and affect: No depression, anxiety, or agitation     DATA REVIEWED:  Latest Reference Range & Units 10/04/21 10:19  Sodium 135 - 145 mEq/L 142  Potassium 3.5 - 5.1 mEq/L 4.5  Chloride 96 - 112 mEq/L 107  CO2 19 - 32 mEq/L 25  Glucose 70 - 99 mg/dL 97  BUN 6 - 23 mg/dL 27 (H)  Creatinine 0.40 - 1.20 mg/dL 1.34 (H)  Calcium 8.4 - 10.5 mg/dL 11.6 (H)  Albumin 3.5 - 5.2 g/dL 4.6  GFR >60.00 mL/min 37.55 (L)    Latest Reference Range & Units 10/04/21 10:19  VITD 30.00 - 100.00 ng/mL 70.01    Latest Reference Range & Units 10/04/21 10:19  PTH, Intact 16 - 77 pg/mL 118 (H)    Latest Reference Range & Units Most Recent  Calcium, 24H Urine mg/24 h 255 (H) 10/06/21 08:33  Creatinine, 24H Ur 0.50 - 2.15 g/24 h 1.06 10/06/21 08:33     DXA 10/28/2021  FINDINGS: [facility] LUMBAR SPINE (L1, L2, L4)   Bone Mineral Density (BMD):  1.148 g/cm2   Young Adult T-Score:  -0.3   Z-Score:  0.6   LEFT FOREARM (1/3 RADIUS)   Bone Mineral Density (BMD):  0.676   Young Adult T Score:  -2.4   Z Score:  0.3    ASSESSMENT/PLAN/RECOMMENDATIONS:   Primary hyperparathyroidism:  -She is S/P  right inferior parathyroidectomy in 1999 with recurrence  - Bone density show low bone density 10/2021 - She has elevated urine calcium , this is partly due to hyperpara. as well as being on  furosemide  - Pt DOES meet surgical criteria for another parathyroidectomy due to low GFR, elevated 24 hr urine calcium and  renal stones but due to age and co-morbidities this is though to be a high risk at this time unless cleared by cardiology  - Repeat serum calcium ****     Recommendation Stay hydrated Avoid OTC calcium tablets Consume 2-3 servings of dietary calcium daily Continue  vitamin D3 to 1000 IU daily   Follow-up in 6 months   Signed electronically by: Mack Guise, MD  [facility] Endocrinology  [address] [facility] [address] [place], [place] [address] Phone: [phone] FAX: [phone]   CC: [name], [place] [address] Phone: [phone] Fax: [phone]   Return to Endocrinology clinic as below: Future Appointments  Date Time Provider [place]  07/08/2022  1:40 PM Zayde Stroupe, Melanie Crazier, MD [facility] None  07/12/2022  2:30 PM [facility] CHAIR 1 [facility] None  09/07/2022  1:40 PM Minus Breeding, MD [facility]  09/27/2022  9:00 AM [facility] LAB [facility] None  09/27/2022 10:00 AM Harriett Rush, PA-C [facility] None  11/09/2022 10:00 AM [name], FNP [facility] None  12/27/2022 10:30 AM [facility]-ANNUAL WELLNESS VISIT [facility] None

## 2022-07-09 LAB — PARATHYROID HORMONE, INTACT (NO CA): PTH: 94 pg/mL — ABNORMAL HIGH (ref 16–77)

## 2022-07-12 ENCOUNTER — Inpatient Hospital Stay: Payer: PPO

## 2022-07-12 VITALS — BP 171/62 | HR 76 | Temp 96.9°F | Resp 18

## 2022-07-12 DIAGNOSIS — D500 Iron deficiency anemia secondary to blood loss (chronic): Secondary | ICD-10-CM | POA: Diagnosis not present

## 2022-07-12 MED ORDER — SODIUM CHLORIDE 0.9 % IV SOLN
510.0000 mg | Freq: Once | INTRAVENOUS | Status: AC
  Administered 2022-07-12: 510 mg via INTRAVENOUS
  Filled 2022-07-12: qty 17

## 2022-07-12 MED ORDER — SODIUM CHLORIDE 0.9 % IV SOLN: Freq: Once | INTRAVENOUS | Status: AC

## 2022-07-12 NOTE — Progress Notes (Signed)
Pt presents today for Feraheme IV iron infusion per provider's order. Vital signs stable and pt voiced no new complaints at this time.  Pt took pre-meds at home at home prior to arrival.Peripheral IV started with good blood return pre and post infusion.  Feraheme  given today per MD orders. Tolerated infusion without adverse affects. Vital signs stable. No complaints at this time. Discharged from clinic via wheelchair in stable condition. Alert and oriented x 3. F/U with [facility] as scheduled.

## 2022-07-12 NOTE — Patient Instructions (Signed)
[facility]  Discharge Instructions: Thank you for choosing [facility] to provide your oncology and hematology care.  If you have a lab appointment with the [facility], please come in thru the Main Entrance and check in at the main information desk.  Wear comfortable clothing and clothing appropriate for easy access to any Portacath or PICC line.   We strive to give you quality time with your provider. You may need to reschedule your appointment if you arrive late (15 or more minutes).  Arriving late affects you and other patients whose appointments are after yours.  Also, if you miss three or more appointments without notifying the office, you may be dismissed from the clinic at the provider's discretion.      For prescription refill requests, have your pharmacy contact our office and allow 72 hours for refills to be completed.    Today you received Feraheme IV iron infusion.   BELOW ARE SYMPTOMS THAT SHOULD BE REPORTED IMMEDIATELY: *FEVER GREATER THAN 100.4 F (38 C) OR HIGHER *CHILLS OR SWEATING *NAUSEA AND VOMITING THAT IS NOT CONTROLLED WITH YOUR NAUSEA MEDICATION *UNUSUAL SHORTNESS OF BREATH *UNUSUAL BRUISING OR BLEEDING *URINARY PROBLEMS (pain or burning when urinating, or frequent urination) *BOWEL PROBLEMS (unusual diarrhea, constipation, pain near the anus) TENDERNESS IN MOUTH AND THROAT WITH OR WITHOUT PRESENCE OF ULCERS (sore throat, sores in mouth, or a toothache) UNUSUAL RASH, SWELLING OR PAIN  UNUSUAL VAGINAL DISCHARGE OR ITCHING   Items with * indicate a potential emergency and should be followed up as soon as possible or go to the Emergency Department if any problems should occur.  Please show the CHEMOTHERAPY ALERT CARD or IMMUNOTHERAPY ALERT CARD at check-in to the Emergency Department and triage nurse.  Should you have questions after your visit or need to cancel or reschedule your appointment, please contact [facility] [phone]  and follow the prompts.  Office hours are 8:00 a.m. to 4:30 p.m. Monday - Friday. Please note that voicemails left after 4:00 p.m. may not be returned until the following business day.  We are closed weekends and major holidays. You have access to a nurse at all times for urgent questions. Please call the main number to the clinic [phone] and follow the prompts.  For any non-urgent questions, you may also contact your provider using MyChart. We now offer e-Visits for anyone 35 and older to request care online for non-urgent symptoms. For details visit [email or web address].   Also download the MyChart app! Go to the app store, search "MyChart", open the app, select [facility], and log in with your MyChart username and password.  Masks are optional in the cancer centers. If you would like for your care team to wear a mask while they are taking care of you, please let them know. You may have one support person who is at least 80 years old accompany you for your appointments.

## 2022-07-13 ENCOUNTER — Telehealth: Payer: Self-pay | Admitting: Internal Medicine

## 2022-07-13 NOTE — Telephone Encounter (Signed)
-----   Message from Minus Breeding, MD sent at 07/12/2022  8:49 PM EST ----- She would be a surgical candidate.  I don't think this would be an issue and she could be off of her Elquis as needed for the surgery.   ----- Message ----- From: Cloyd Stagers, MD Sent: 07/11/2022  10:13 AM EST To: Minus Breeding, MD  Clearnce Sorrel,    I am reaching out to your regarding [name] to see what your thoughts are about her having parathyroidectomy given her cardiac history.    She does meet surgical criteria based on  elevated serum calcium > 11.5 mg/dL  ,presence of renal stones and the fact that she has hypercalciuria which increases her risk of worsening renal stones as well as worsening GFR.    I tried to treat her medically for hypercalciuria ( Part of hypercalciuria is furosemide) I put her on HCTZ but she developed severe hypokalemia and it was stopped.    Please let me know your thoughts   Abby     Abby Nena Jordan, MD  [facility] Endocrinology  [facility] [address] [place], [place] [address] Phone: [phone] FAX: [phone]

## 2022-07-13 NOTE — Telephone Encounter (Signed)
Please let the pt know that I have heard from her cardiologist and he believes that she could withstand the parathyroid surgery.    Please see if she would like a referral to the surgeon at this time or does she like to discuss this further on her next visit with me.      Thanks

## 2022-07-20 NOTE — Telephone Encounter (Signed)
Patient is scheduled for 10/28/2022 at 10:30 AM for virtual visit.

## 2022-07-26 ENCOUNTER — Other Ambulatory Visit: Payer: Self-pay | Admitting: Family Medicine

## 2022-07-26 ENCOUNTER — Telehealth (INDEPENDENT_AMBULATORY_CARE_PROVIDER_SITE_OTHER): Payer: PPO | Admitting: Internal Medicine

## 2022-07-26 ENCOUNTER — Encounter: Payer: Self-pay | Admitting: Internal Medicine

## 2022-07-26 VITALS — Ht 63.0 in

## 2022-07-26 DIAGNOSIS — E785 Hyperlipidemia, unspecified: Secondary | ICD-10-CM

## 2022-07-26 DIAGNOSIS — E210 Primary hyperparathyroidism: Secondary | ICD-10-CM

## 2022-07-26 NOTE — Progress Notes (Signed)
Virtual Visit via Video Note  I connected with [name] [name] on 07/26/22 at 11:10 am  by a video enabled telemedicine application and verified that I am speaking with the correct person using two identifiers.   I discussed the limitations of evaluation and management by telemedicine and the availability of in person appointments. The patient expressed understanding and agreed to proceed.  -Location of the patient :home -Location of the provider : Office -The names of all persons participating in the telemedicine service : Pt and myself      Name: [name] [name]  MRN/ DOB: [id number], 20-Dec-1940    Age/ Sex: 82 y.o., female    PCP: [name], FNP   Reason for Endocrinology Evaluation: Elevated PTH     Date of Initial Endocrinology Evaluation: 10/04/2021    HPI: [name] [name] is a 82 y.o. female with a past medical history of Dyslipidemia, Hypothyroidism , CHF and A.Fib . The patient presented for initial endocrinology clinic visit on 10/04/2021 for consultative assistance with her Elevated PTH.   Pt has been noted with elevated PTH during routine labs 05/10/2021 at 103 pg/mL .   Pt has been noted  with hypercalcemia intermittently since 2012 with a max level of 11.9 mg/dL (uncorrected ) 09/2020.   She has experienced symptoms of constipation, polyuria. She denies use of over the counter calcium (including supplements, Tums, Rolaids, or other calcium containing antacids), lithium, HCTZ but she is on Furosemide   She is on  vitamin D 5000 iu daily   She does have history of kidney stones ( 12/2020) , CKD III but no  liver disease, granulomatous disease. She denies osteoporosis (DXA 08/2017 with a T-score of -2.4 at  distal 1/3rd forearm) , nor prior fractures. Daily dietary calcium intake: 1-2 servings. She denies family history of osteoporosis.   Son with  parathyroid disease Mother with questionable thyroid disease.   She is S/P right inferior  parathyroidectomy 1999    DXA T score -2.4 at distal radius 10/2021   She was noted with hypercalciuria at 255 mg 12/2021 , I had started her on HCTZ while on furosemide for CHF but this was discontinued due to hypokalemia    We stopped vitamin D 06/2022 with a vitD 58.76   SUBJECTIVE:    Today (07/26/22): [name] [name] is here for hyperparathyroidism.  She has requested this appointment based on my recommendation for surgical intervention for hyperparathyroidism.   I have explained to the patient that her cardiologist has agreed to proceed with surgical intervention if needed   She is on iron infusions due to anemia.   She continues with polydipsia and polyuria Constipation has been improving No recent falls since her last visit here  No OTC calcium  We had stopped vitamin D last month   HISTORY:  Past Medical History:  Past Medical History:  Diagnosis Date  . Anemia   . Arthritis    Ostearthritis- hips, knees, fingers  . Basal cell carcinoma (BCC) of right hand 10/2021   Dx by [facility]  . DVT (deep venous thrombosis) ([place])   . Dyspnea   . GERD (gastroesophageal reflux disease)   . Headache(784.0)    tx. Valproic acid  . History of diabetes mellitus   . History of kidney stones   . Hypertension   . Hypothyroidism   . Pulmonary embolism [facility])    Past Surgical History:  Past Surgical History:  Procedure Laterality Date  . ABDOMINAL HYSTERECTOMY    .  BILATERAL HIP ARTHROSCOPY Left   . BIOPSY  12/08/2019   Procedure: BIOPSY;  Surgeon: Daneil Dolin, MD;  [facility];  Service: Endoscopy;;  . CATARACT EXTRACTION, BILATERAL    . COLONOSCOPY N/A 12/08/2019   polyps (tubular adenoma), diverticulosis, colonic lipoma, no surveillance due to age  . CYSTOSCOPY W/ URETERAL STENT PLACEMENT Right 12/25/2020   Procedure: CYSTOSCOPY WITH RETROGRADE PYELOGRAM/URETERAL STENT PLACEMENT;  Surgeon: Janith Lima, MD;  Location: [facility];  Service:  Urology;  Laterality: Right;  . CYSTOSCOPY/URETEROSCOPY/HOLMIUM LASER/STENT PLACEMENT Right 01/15/2021   Procedure: CYSTOSCOPY/RETROGRADE/URETEROSCOPY/HOLMIUM LASER/STENT EXCHANGE;  Surgeon: Janith Lima, MD;  [facility];  Service: Urology;  Laterality: Right;  . 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  . ESOPHAGOGASTRODUODENOSCOPY (EGD) WITH PROPOFOL N/A 05/18/2021   Surgeon: Clarene Essex, MD;   Tiny hiatal hernia, gastric antral vascular ectasia s/p ablation, normal duodenum, normal jejunum.  . ESOPHAGOGASTRODUODENOSCOPY (EGD) WITH PROPOFOL N/A 03/10/2022   Procedure: ESOPHAGOGASTRODUODENOSCOPY (EGD) WITH PROPOFOL;  Surgeon: Eloise Harman, DO;  [facility];  Service: Endoscopy;  Laterality: N/A;  10:45am  . GIVENS CAPSULE STUDY N/A 01/13/2020   Procedure: GIVENS CAPSULE STUDY;  Surgeon: Daneil Dolin, MD;  [facility];  Service: Endoscopy;  Laterality: N/A;  7:30am  . GIVENS CAPSULE STUDY N/A 05/04/2021   Procedure: GIVENS CAPSULE STUDY;  Surgeon: Clarene Essex, MD;  [facility];  Service: Endoscopy;  Laterality: N/A;  . MULTIPLE TOOTH EXTRACTIONS     60's  . PARATHYROIDECTOMY    . POLYPECTOMY  12/08/2019   Procedure: POLYPECTOMY;  Surgeon: Daneil Dolin, MD;  [facility];  Service: Endoscopy;;  . RADIOFREQUENCY ABLATION  05/18/2021   Procedure: RADIO FREQUENCY ABLATION;  Surgeon: Clarene Essex, MD;  [facility];  Service: Endoscopy;;  . TEE WITHOUT CARDIOVERSION N/A 07/06/2021   Procedure: TRANSESOPHAGEAL ECHOCARDIOGRAM (TEE);  Surgeon: Donato Heinz, MD;  Location: [facility] ENDOSCOPY;  Service: Cardiovascular;  Laterality: N/A;  . TOTAL HIP ARTHROPLASTY Right 10/29/2013   Procedure: RIGHT TOTAL HIP ARTHROPLASTY ANTERIOR APPROACH;  Surgeon: Mauri Pole, MD;  [facility];  Service: Orthopedics;  Laterality: Right;     Social History:  reports that she has never smoked. She has never used smokeless tobacco. She reports that she does not drink alcohol and does not use drugs. Family History: family history includes Cancer (age of onset: 20) in her sister; Cancer (age of onset: 35) in her mother; Colitis (age of onset: 9) in her sister; Healthy in her daughter, son, son, and son; Heart attack (age of onset: 45) in her brother; Heart disease in her brother; Heart disease (age of onset: 70) in her father; Pulmonary embolism (age of onset: 31) in her brother.   HOME MEDICATIONS: Allergies as of 07/26/2022   No Known Allergies      Medication List        Accurate as of July 26, 2022  8:45 AM. If you have any questions, ask your nurse or doctor.          acetaminophen 325 MG tablet Commonly known as: TYLENOL Take 2 tablets (650 mg total) by mouth every 6 (six) hours as needed for mild pain or headache (or Fever >/= 101).   allopurinol 100 MG tablet Commonly known as: ZYLOPRIM Take 1 tablet (100 mg total) by mouth daily.   amLODipine 2.5 MG tablet Commonly known as: NORVASC Take 1  tablet (2.5 mg total) by mouth daily.   apixaban 2.5 MG Tabs tablet Commonly known as: ELIQUIS Take 1 tablet (2.5 mg total) by mouth 2 (two) times daily.   atorvastatin 20 MG tablet Commonly known as: LIPITOR Take 1 tablet by mouth once daily   dapagliflozin propanediol 10 MG Tabs tablet Commonly known as: Farxiga Take 1 tablet (10 mg total) by mouth daily.   furosemide 20 MG tablet Commonly known as: Lasix Take 1 tablet (20 mg total) by mouth daily.   GNP CRANBERRY EXTRACT PO Take 500 mg by mouth in the morning.   levothyroxine 50 MCG tablet Commonly known as: SYNTHROID TAKE 1 TABLET BY MOUTH ONCE DAILY BEFORE BREAKFAST   lisinopril 2.5 MG tablet Commonly known as: Zestril Take 1 tablet (2.5 mg total) by mouth daily.   loratadine 10 MG tablet Commonly known as: CLARITIN Take 10 mg by mouth  daily.   oxybutynin 10 MG 24 hr tablet Commonly known as: DITROPAN-XL Take 1 tablet (10 mg total) by mouth at bedtime.   pantoprazole 40 MG tablet Commonly known as: PROTONIX Take 1 tablet (40 mg total) by mouth 2 (two) times daily before a meal.   polyethylene glycol 17 g packet Commonly known as: MIRALAX / GLYCOLAX Take 17 g by mouth daily as needed for mild constipation.   vitamin B-12 50 MCG tablet Commonly known as: CYANOCOBALAMIN Take 50 mcg by mouth daily.   vitamin D3 25 MCG tablet Commonly known as: CHOLECALCIFEROL Take 1,000 Units by mouth daily.          REVIEW OF SYSTEMS: A comprehensive ROS was conducted with the patient and is negative except as per HPI    OBJECTIVE:  VS: There were no vitals taken for this visit.    Wt Readings from Last 3 Encounters:  07/08/22 203 lb (92.1 kg)  06/28/22 201 lb (91.2 kg)  06/01/22 202 lb 3.2 oz (91.7 kg)     EXAM: General: Pt appears well and is in NAD  Neck: General: Supple without adenopathy. Thyroid: Thyroid size normal.  No goiter or nodules appreciated.   Lungs: Clear with good BS bilat with no rales, rhonchi, or wheezes  Heart: Auscultation: RRR.  Abdomen: Normoactive bowel sounds, soft, nontender, without masses or organomegaly palpable  Extremities:  BL LE: No pretibial edema normal ROM and strength.  Mental Status: Judgment, insight: Intact Orientation: Oriented to time, place, and person Mood and affect: No depression, anxiety, or agitation     DATA REVIEWED:  Latest Reference Range & Units 07/08/22 14:22  Sodium 135 - 145 mEq/L 141  Potassium 3.5 - 5.1 mEq/L 3.9  Chloride 96 - 112 mEq/L 107  CO2 19 - 32 mEq/L 27  Glucose 70 - 99 mg/dL 105 (H)  BUN 6 - 23 mg/dL 26 (H)  Creatinine 0.40 - 1.20 mg/dL 1.18  Calcium 8.4 - 10.5 mg/dL 12.0 (H)  Albumin 3.5 - 5.2 g/dL 4.3    Latest Reference Range & Units 07/08/22 14:22  GFR >60.00 mL/min 43.51 (L)    Latest Reference Range & Units 07/08/22  14:22  VITD 30.00 - 100.00 ng/mL 58.76    Latest Reference Range & Units 07/08/22 14:22  PTH, Intact 16 - 77 pg/mL 94 (H)      Latest Reference Range & Units Most Recent  Calcium, 24H Urine mg/24 h 255 (H) 10/06/21 08:33  Creatinine, 24H Ur 0.50 - 2.15 g/24 h 1.06 10/06/21 08:33     DXA 10/28/2021  FINDINGS: AP LUMBAR  SPINE (L1, L2, L4)   Bone Mineral Density (BMD):  1.148 g/cm2   Young Adult T-Score:  -0.3   Z-Score:  0.6   LEFT FOREARM (1/3 RADIUS)   Bone Mineral Density (BMD):  0.676   Young Adult T Score:  -2.4   Z Score:  0.3    ASSESSMENT/PLAN/RECOMMENDATIONS:   Primary hyperparathyroidism:  - She is S/P  right inferior parathyroidectomy in 1999 with recurrence  - Bone density show low bone density 10/2021 - She has elevated urine calcium , this is partly due to hyperparathyroidism. as well as being on furosemide -Her most recent serum calcium was elevated at 12 mg/DL (corrected 11.76)  - Pt DOES meet surgical criteria for another parathyroidectomy due to low GFR, elevated 24 hr urine calcium, presence of renal stones and serum calcium >11.5 mg/dL  -Today all questions have been answered and she has agreed to proceed with referral to general surgery -We also explored medical options but given multiple organ damage evident by hyperparathyroidism, it may be best to explore surgical intervention -Her previous surgery was done by Dr. Armandina Gemma, will refer her back     Recommendation Stay hydrated Avoid OTC calcium tablets Consume 2-3 servings of dietary calcium daily    Follow-up in 6 months   Signed electronically by: Mack Guise, MD  [facility]  [facility] [address] [place], [place] [address] Phone: [phone] FAX: [phone]   CC: [name], [place] [address] Phone: [phone] Fax: [phone]   Return to Endocrinology clinic as below: Future  Appointments  Date Time Provider [place]  07/26/2022 11:10 AM Tobyn Osgood, Melanie Crazier, MD [facility] None  09/07/2022  1:40 PM Minus Breeding, MD [facility]  09/27/2022  9:00 AM [facility] None  09/27/2022 10:00 AM Harriett Rush, PA-C [facility] None  11/09/2022 10:00 AM [name], FNP [facility] None  12/27/2022 10:30 AM [facility]NUAL WELLNESS VISIT [facility] None  01/17/2023  1:40 PM Adaora Mchaney, Melanie Crazier, MD [facility] None

## 2022-08-01 ENCOUNTER — Other Ambulatory Visit: Payer: Self-pay | Admitting: *Deleted

## 2022-08-01 MED ORDER — PANTOPRAZOLE SODIUM 40 MG PO TBEC: 40.0000 mg | DELAYED_RELEASE_TABLET | Freq: Two times a day (BID) | ORAL | 0 refills | Status: DC

## 2022-08-01 MED ORDER — OXYBUTYNIN CHLORIDE ER 10 MG PO TB24: 10.0000 mg | ORAL_TABLET | Freq: Every day | ORAL | 0 refills | Status: DC

## 2022-08-22 ENCOUNTER — Other Ambulatory Visit: Payer: Self-pay | Admitting: Family Medicine

## 2022-08-22 DIAGNOSIS — E039 Hypothyroidism, unspecified: Secondary | ICD-10-CM

## 2022-09-04 NOTE — Progress Notes (Unsigned)
Cardiology Office Note   Date:  09/07/2022   ID:  Montrell, Toomes 1941/07/21, MRN [id number]  PCP:  [name], FNP  Cardiologist:   Minus Breeding, MD   Chief Complaint  Patient presents with   Atrial Fibrillation      History of Present Illness: [name] [name] is a 82 y.o. female who presents for management of chronic systolic CHF and persistent atrial fibrillation with CHADS  VASC Score of 6.   Echocardiogram on 02/07/21 revealed 35% to 40%, moderate elevated PASP with estimated RVSP 51.5 mmHg, severely dilated LA, moderate dilated RA, severe MR moderate TR, mild aortic sclerosis.  She did have a repeat echocardiogram which was completed on 04/01/2021.  She was found to have a normal ejection fraction of 55 to 60% with normal LV function.  It was however found that she had severe mitral valve regurgitation with no evidence of mitral valve stenosis, moderate mitral annular calcification.  PI SA ER O0 0.44 cm, MR radius 1.1 cm and MR regurgitant volume of 92 cc.  She was recommended for TEE for further assessment of mitral regurgitation.   She had this in Dec and her MR was thought to be moderate and this was reviewed by the structural team and intervention was not suggested.    She was in the hospital in July 2023 with sepsis.  Since I last saw her she has done okay.  She gets around with a cane.  She has knee problems that limit her for the most part.  She walks with a walker at times. The patient denies any new symptoms such as chest discomfort, neck or arm discomfort. There has been no new shortness of breath, PND or orthopnea. There have been no reported palpitations, presyncope or syncope.    Past Medical History:  Diagnosis Date   Anemia    Arthritis    Ostearthritis- hips, knees, fingers   Basal cell carcinoma (BCC) of right hand 10/2021   Dx by [facility]   DVT (deep venous thrombosis) ([facility])    Dyspnea    GERD (gastroesophageal reflux disease)     Headache(784.0)    tx. Valproic acid   History of diabetes mellitus    History of kidney stones    Hypertension    Hypothyroidism    Pulmonary embolism [facility])     Past Surgical History:  Procedure Laterality Date   ABDOMINAL HYSTERECTOMY     BILATERAL HIP ARTHROSCOPY Left    BIOPSY  12/08/2019   Procedure: BIOPSY;  Surgeon: Daneil Dolin, MD;  [facility];  Service: Endoscopy;;   CATARACT EXTRACTION, BILATERAL     COLONOSCOPY N/A 12/08/2019   polyps (tubular adenoma), diverticulosis, colonic lipoma, no surveillance due to age   [address] [place] Right 12/25/2020   Procedure: CYSTOSCOPY WITH RETROGRADE PYELOGRAM/URETERAL STENT PLACEMENT;  Surgeon: Janith Lima, MD;  [facility];  Service: Urology;  Laterality: Right;   CYSTOSCOPY/URETEROSCOPY/HOLMIUM LASER/STENT PLACEMENT Right 01/15/2021   Procedure: CYSTOSCOPY/RETROGRADE/URETEROSCOPY/HOLMIUM LASER/STENT EXCHANGE;  Surgeon: Janith Lima, MD;  [facility];  Service: Urology;  Laterality: Right;   ESOPHAGOGASTRODUODENOSCOPY N/A 12/08/2019   normal esophagus with 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   ESOPHAGOGASTRODUODENOSCOPY (EGD) WITH PROPOFOL N/A 05/18/2021   Surgeon: Clarene Essex, MD;   Tiny hiatal hernia, gastric antral vascular ectasia s/p ablation, normal duodenum, normal jejunum.   ESOPHAGOGASTRODUODENOSCOPY (EGD) WITH PROPOFOL N/A 03/10/2022   Procedure: ESOPHAGOGASTRODUODENOSCOPY (  EGD) WITH PROPOFOL;  Surgeon: Eloise Harman, DO;  [facility];  Service: Endoscopy;  Laterality: N/A;  10:45am   GIVENS CAPSULE STUDY N/A 01/13/2020   Procedure: GIVENS CAPSULE STUDY;  Surgeon: Daneil Dolin, MD;  [facility];  Service: Endoscopy;  Laterality: N/A;  7:30am   GIVENS CAPSULE STUDY N/A 05/04/2021   Procedure: GIVENS CAPSULE STUDY;  Surgeon: Clarene Essex, MD;  [facility];  Service: Endoscopy;  Laterality: N/A;    MULTIPLE TOOTH EXTRACTIONS     60's   PARATHYROIDECTOMY     POLYPECTOMY  12/08/2019   Procedure: POLYPECTOMY;  Surgeon: Daneil Dolin, MD;  [facility];  Service: Endoscopy;;   RADIOFREQUENCY ABLATION  05/18/2021   Procedure: RADIO FREQUENCY ABLATION;  Surgeon: Clarene Essex, MD;  [facility];  Service: Endoscopy;;   TEE WITHOUT CARDIOVERSION N/A 07/06/2021   Procedure: TRANSESOPHAGEAL ECHOCARDIOGRAM (TEE);  Surgeon: Donato Heinz, MD;  [facility] ENDOSCOPY;  Service: Cardiovascular;  Laterality: N/A;   TOTAL HIP ARTHROPLASTY Right 10/29/2013   Procedure: RIGHT TOTAL HIP ARTHROPLASTY ANTERIOR APPROACH;  Surgeon: Mauri Pole, MD;  [facility];  Service: Orthopedics;  Laterality: Right;     Current Outpatient Medications  Medication Sig Dispense Refill   acetaminophen (TYLENOL) 325 MG tablet Take 2 tablets (650 mg total) by mouth every 6 (six) hours as needed for mild pain or headache (or Fever >/= 101). 12 tablet 0   allopurinol (ZYLOPRIM) 100 MG tablet Take 1 tablet (100 mg total) by mouth daily. 90 tablet 1   amLODipine (NORVASC) 2.5 MG tablet Take 1 tablet (2.5 mg total) by mouth daily. 90 tablet 3   apixaban (ELIQUIS) 5 MG TABS tablet Take 1 tablet (5 mg total) by mouth 2 (two) times daily. 60 tablet 6   atorvastatin (LIPITOR) 20 MG tablet Take 1 tablet by mouth once daily 90 tablet 0   dapagliflozin propanediol (FARXIGA) 10 MG TABS tablet Take 1 tablet (10 mg total) by mouth daily. 90 tablet 11   furosemide (LASIX) 20 MG tablet Take 1 tablet (20 mg total) by mouth daily. 90 tablet 2   GNP CRANBERRY EXTRACT PO Take 500 mg by mouth in the morning.     levothyroxine (SYNTHROID) 50 MCG tablet TAKE 1 TABLET BY MOUTH ONCE DAILY BEFORE BREAKFAST 90 tablet 3   lisinopril (ZESTRIL) 2.5 MG tablet Take 1 tablet (2.5 mg total) by mouth daily. 90 tablet 3   loratadine (CLARITIN) 10 MG tablet Take 10 mg by mouth daily.     oxybutynin (DITROPAN-XL) 10 MG 24  hr tablet Take 1 tablet (10 mg total) by mouth at bedtime. 90 tablet 0   pantoprazole (PROTONIX) 40 MG tablet Take 1 tablet (40 mg total) by mouth 2 (two) times daily before a meal. 180 tablet 0   polyethylene glycol (MIRALAX / GLYCOLAX) 17 g packet Take 17 g by mouth daily as needed for mild constipation. 28 each 1   vitamin B-12 (CYANOCOBALAMIN) 50 MCG tablet Take 50 mcg by mouth daily.     No current facility-administered medications for this visit.    Allergies:   Patient has no known allergies.    ROS:  Please see the history of present illness.   Otherwise, review of systems are positive for none.   All other systems are reviewed and negative.      PHYSICAL EXAM: VS:  BP 124/88   Pulse 66   Ht 5' 3"$  (1.6 m)  Wt 205 lb (93 kg)   BMI 36.31 kg/m  , BMI Body mass index is 36.31 kg/m. GEN:  No distress NECK:  No jugular venous distention at 90 degrees, waveform within normal limits, carotid upstroke brisk and symmetric, no bruits, no thyromegaly LYMPHATICS:  No cervical adenopathy LUNGS:  Clear to auscultation bilaterally BACK:  No CVA tenderness CHEST:  Unremarkable HEART:  S1 and S2 within normal limits, no S3, no clicks, no rubs, no murmurs, irregular ABD:  Positive bowel sounds normal in frequency in pitch, no bruits, no rebound, no guarding, unable to assess midline mass or bruit with the patient seated. EXT:  2 plus pulses throughout, no edema, no cyanosis no clubbing SKIN:  No rashes no nodules NEURO:  Cranial nerves II through XII grossly intact, motor grossly intact throughout PSYCH:  Cognitively intact, oriented to person place and time   EKG:  EKG is  ordered today. The ekg ordered today demonstrates sinus bradycardia, rate 66 , premature atrial contractions, short PR interval, no acute ST-T wave changes.   Recent Labs: 02/10/2022: Magnesium 2.3 05/09/2022: ALT 13; TSH 2.340 06/28/2022: Hemoglobin 11.8; Platelets 212 07/08/2022: BUN 26; Creatinine, Ser 1.18;  Potassium 3.9; Sodium 141    Lipid Panel    Component Value Date/Time   CHOL 161 05/09/2022 1028   TRIG 145 05/09/2022 1028   HDL 50 05/09/2022 1028   CHOLHDL 3.2 05/09/2022 1028   CHOLHDL 3.7 02/07/2021 0528   VLDL 13 02/07/2021 0528   LDLCALC 86 05/09/2022 1028      Wt Readings from Last 3 Encounters:  09/07/22 205 lb (93 kg)  07/08/22 203 lb (92.1 kg)  06/28/22 201 lb (91.2 kg)      Other studies Reviewed: Additional studies/ records that were reviewed today include:   Labs Review of the above records demonstrates:  Please see elsewhere in the note.     ASSESSMENT AND PLAN:  Mitral valve regurgitation with moderate mitral annular calcification:    This was mild in Sept 2023.  No change in therapy.    History of atrial fibrillation with RVR:   She seems to be more in persistent atrial fibrillation.  She does notice some palpitations.  She is had her creatinine go back to 1.18 most recently so she needs to be back on 5 mg Eliquis twice daily.  T  History of systolic heart failure: She seems to be euvolemic.  No change in therapy.  HTN: Her blood pressure is well-controlled.  No change in therapy.  Current medicines are reviewed at length with the patient today.  The patient does not have concerns regarding medicines.  The following changes have been made: None  Labs/ tests ordered today include: None  Orders Placed This Encounter  Procedures   EKG 12-Lead    Disposition:   FU with me in 6 months.     Signed, Minus Breeding, MD  09/07/2022 2:31 PM     [facility]

## 2022-09-07 ENCOUNTER — Ambulatory Visit (INDEPENDENT_AMBULATORY_CARE_PROVIDER_SITE_OTHER): Payer: PPO | Admitting: Cardiology

## 2022-09-07 ENCOUNTER — Encounter: Payer: Self-pay | Admitting: Cardiology

## 2022-09-07 VITALS — BP 124/88 | HR 66 | Ht 63.0 in | Wt 205.0 lb

## 2022-09-07 DIAGNOSIS — I340 Nonrheumatic mitral (valve) insufficiency: Secondary | ICD-10-CM | POA: Diagnosis not present

## 2022-09-07 MED ORDER — APIXABAN 5 MG PO TABS: 5.0000 mg | ORAL_TABLET | Freq: Two times a day (BID) | ORAL | 6 refills | Status: DC

## 2022-09-07 NOTE — Patient Instructions (Signed)
Medication Instructions:  Please increase your Eliquis to 5 mg twice a day. Continue all other medications as listed.  *If you need a refill on your cardiac medications before your next appointment, please call your pharmacy*  Follow-Up: At [facility], you and your health needs are our priority.  As part of our continuing mission to provide you with exceptional heart care, we have created designated Provider Care Teams.  These Care Teams include your primary Cardiologist (physician) and Advanced Practice Providers (APPs -  Physician Assistants and Nurse Practitioners) who all work together to provide you with the care you need, when you need it.  We recommend signing up for the patient portal called "MyChart".  Sign up information is provided on this After Visit Summary.  MyChart is used to connect with patients for Virtual Visits (Telemedicine).  Patients are able to view lab/test results, encounter notes, upcoming appointments, etc.  Non-urgent messages can be sent to your provider as well.   To learn more about what you can do with MyChart, go to [email or web address].    Your next appointment:   6 month(s)  Provider:   Minus Breeding, MD

## 2022-09-12 ENCOUNTER — Other Ambulatory Visit (HOSPITAL_COMMUNITY): Payer: Self-pay

## 2022-09-12 ENCOUNTER — Encounter: Payer: Self-pay | Admitting: Hematology

## 2022-09-13 ENCOUNTER — Telehealth: Payer: Self-pay

## 2022-09-13 NOTE — Telephone Encounter (Signed)
Mailed BMS application to patients home for possible assistance for Eliquis in next few months or so. Most likely doesn't qualify yet (based on 3% oop that needs to be spent on medication).

## 2022-09-15 ENCOUNTER — Other Ambulatory Visit: Payer: Self-pay | Admitting: *Deleted

## 2022-09-15 DIAGNOSIS — M1A9XX0 Chronic gout, unspecified, without tophus (tophi): Secondary | ICD-10-CM

## 2022-09-15 MED ORDER — ALLOPURINOL 100 MG PO TABS: 100.0000 mg | ORAL_TABLET | Freq: Every day | ORAL | 0 refills | Status: DC

## 2022-09-22 ENCOUNTER — Encounter: Payer: Self-pay | Admitting: Radiology

## 2022-09-27 ENCOUNTER — Inpatient Hospital Stay: Payer: PPO | Attending: Physician Assistant | Admitting: Physician Assistant

## 2022-09-27 ENCOUNTER — Inpatient Hospital Stay: Payer: PPO

## 2022-09-27 VITALS — BP 157/60 | HR 44 | Temp 98.0°F | Resp 16 | Wt 202.6 lb

## 2022-09-27 DIAGNOSIS — E039 Hypothyroidism, unspecified: Secondary | ICD-10-CM | POA: Diagnosis not present

## 2022-09-27 DIAGNOSIS — Z7984 Long term (current) use of oral hypoglycemic drugs: Secondary | ICD-10-CM | POA: Diagnosis not present

## 2022-09-27 DIAGNOSIS — K219 Gastro-esophageal reflux disease without esophagitis: Secondary | ICD-10-CM | POA: Insufficient documentation

## 2022-09-27 DIAGNOSIS — Z7901 Long term (current) use of anticoagulants: Secondary | ICD-10-CM | POA: Diagnosis not present

## 2022-09-27 DIAGNOSIS — Z85828 Personal history of other malignant neoplasm of skin: Secondary | ICD-10-CM | POA: Diagnosis not present

## 2022-09-27 DIAGNOSIS — Z86711 Personal history of pulmonary embolism: Secondary | ICD-10-CM | POA: Diagnosis not present

## 2022-09-27 DIAGNOSIS — Z79890 Hormone replacement therapy (postmenopausal): Secondary | ICD-10-CM | POA: Insufficient documentation

## 2022-09-27 DIAGNOSIS — I10 Essential (primary) hypertension: Secondary | ICD-10-CM | POA: Diagnosis not present

## 2022-09-27 DIAGNOSIS — Z79899 Other long term (current) drug therapy: Secondary | ICD-10-CM | POA: Insufficient documentation

## 2022-09-27 DIAGNOSIS — Z86718 Personal history of other venous thrombosis and embolism: Secondary | ICD-10-CM | POA: Diagnosis not present

## 2022-09-27 DIAGNOSIS — K5900 Constipation, unspecified: Secondary | ICD-10-CM | POA: Insufficient documentation

## 2022-09-27 DIAGNOSIS — D500 Iron deficiency anemia secondary to blood loss (chronic): Secondary | ICD-10-CM

## 2022-09-27 DIAGNOSIS — M129 Arthropathy, unspecified: Secondary | ICD-10-CM | POA: Diagnosis not present

## 2022-09-27 DIAGNOSIS — Z87442 Personal history of urinary calculi: Secondary | ICD-10-CM | POA: Diagnosis not present

## 2022-09-27 LAB — CBC
HCT: 37.7 % (ref 36.0–46.0)
Hemoglobin: 11.9 g/dL — ABNORMAL LOW (ref 12.0–15.0)
MCH: 30.7 pg (ref 26.0–34.0)
MCHC: 31.6 g/dL (ref 30.0–36.0)
MCV: 97.4 fL (ref 80.0–100.0)
Platelets: 212 10*3/uL (ref 150–400)
RBC: 3.87 MIL/uL (ref 3.87–5.11)
RDW: 14.4 % (ref 11.5–15.5)
WBC: 4.5 10*3/uL (ref 4.0–10.5)
nRBC: 0 % (ref 0.0–0.2)

## 2022-09-27 LAB — IRON AND TIBC
Iron: 56 ug/dL (ref 28–170)
Saturation Ratios: 16 % (ref 10.4–31.8)
TIBC: 359 ug/dL (ref 250–450)
UIBC: 303 ug/dL

## 2022-09-27 LAB — SAMPLE TO BLOOD BANK

## 2022-09-27 LAB — FERRITIN: Ferritin: 20 ng/mL (ref 11–307)

## 2022-09-27 NOTE — Progress Notes (Signed)
[name] [name], [place] [address]   CLINIC:  Medical Oncology/Hematology  PCP:  [name], [place] [address] [phone]   REASON FOR VISIT:  Follow-up for iron deficiency anemia   PRIOR THERAPY: Blood transfusions and oral iron tablets   CURRENT THERAPY: Intermittent IV iron  INTERVAL HISTORY:   [name] [name] 82 y.o. female returns for routine follow-up of her iron deficiency anemia.  She was last seen by Tarri Abernethy PA-C on 06/28/2022.  At today's visit, she reports feeling well.  No recent hospitalizations, surgeries, or changes in baseline health status.  She most recently received Feraheme x2 on 07/05/2022 and 07/12/2022.  She reports improved energy and strength after her iron infusions, not back to baseline as she is tiring easier than she used to and is requiring assistance with household chores that she previously did independently.  She has not noticed any recent melena.  She does report some worsening dyspnea on exertion for the past 2 weeks.  She reports restless legs.  Denies any pica, headaches, lightheadedness, syncope, chest pain.   She is taking iron tablet once daily with mild constipation.  She remains on Eliquis 5 mg BID for history of unprovoked DVT and PE in 2020.    She has 75% energy and 100% appetite. She endorses that she is maintaining a stable weight.   ASSESSMENT & PLAN:  1.  Iron deficiency anemia secondary to GI blood loss - Normocytic anemia secondary to chronic blood loss, may also have some anemia related to CKD stage IIIb - Hematology work-up (02/24/2022):  Ferritin 11, iron saturation 7%.  Normal folate, B12, MMA, copper. 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  Normal duodenum and jejunum. - Colonoscopy (12/08/2019): Diverticulosis and polyps - She denies any overt melena - She is on Eliquis 5 mg twice daily.  She takes iron tablet once daily.   - Most recent IV Feraheme on 07/05/2022 and 07/12/2022 - Labs today (09/27/2022): Hgb 11.9/MCV 97.4, ferritin 20, iron saturation 16% - PLAN: Recommend additional IV iron with Feraheme x2.  No blood transfusion today. - Continue iron tablet daily. - Repeat CBC and iron panel with RTC in 3 months with phone visit - Continue GI follow-up.   2.   Unprovoked right leg DVT and PE: - Doppler on 04/03/2019: DVT in the femoral vein and calf veins. - CT chest PE protocol (04/03/2019): Moderate size pulmonary emboli in the right lower lobe. - She is on Eliquis 5 mg twice daily, also for Afib   3.  Social/family history: - She lives at home with her older son.  She takes care of the garden and does laundry and her ADLs and IADLs.  But lately her energy levels are low and she is not able to do them.  She was a retired tobacco former.  Never smoked. - Sister had anemia.  Mother had uterine cancer and sister had colon cancer.    PLAN SUMMARY: >> IV Feraheme x 2 >> Labs in 3 months = CBC/D, ferritin, iron/TIBC, BMP >> PHONE visit in 3 months (1 week after labs)     REVIEW OF SYSTEMS:   Review  of Systems  Constitutional:  Positive for fatigue. Negative for appetite change, chills, diaphoresis, fever and unexpected weight change.  HENT:   Negative for lump/mass and nosebleeds.   Eyes:  Negative for eye problems.  Respiratory:  Positive for cough and shortness of breath (With exertion). Negative for hemoptysis.   Cardiovascular:  Positive for palpitations. Negative for chest pain and leg swelling.  Gastrointestinal:  Negative for abdominal pain, blood in stool, constipation, diarrhea, nausea and vomiting.   Genitourinary:  Negative for hematuria.   Skin: Negative.   Neurological:  Negative for dizziness, headaches and light-headedness.  Hematological:  Does not bruise/bleed easily.     PHYSICAL EXAM:  ECOG PERFORMANCE STATUS: 2 - Symptomatic, <50% confined to bed  There were no vitals filed for this visit. There were no vitals filed for this visit. Physical Exam Constitutional:      Appearance: Normal appearance. She is obese.  HENT:     Head: Normocephalic and atraumatic.     Mouth/Throat:     Mouth: Mucous membranes are moist.  Eyes:     Extraocular Movements: Extraocular movements intact.     Pupils: Pupils are equal, round, and reactive to light.  Cardiovascular:     Rate and Rhythm: Normal rate. Rhythm irregular.     Pulses: Normal pulses.     Heart sounds: Normal heart sounds.  Pulmonary:     Effort: Pulmonary effort is normal.     Breath sounds: Normal breath sounds.  Abdominal:     General: Bowel sounds are normal.     Palpations: Abdomen is soft.     Tenderness: There is no abdominal tenderness.  Musculoskeletal:        General: No swelling.     Right lower leg: Edema (trace) present.     Left lower leg: Edema (trace) present.  Lymphadenopathy:     Cervical: No cervical adenopathy.  Skin:    General: Skin is warm and dry.  Neurological:     General: No focal deficit present.     Mental Status: She is alert and oriented to person, place, and time.  Psychiatric:        Mood and Affect: Mood normal.        Behavior: Behavior normal.     PAST MEDICAL/SURGICAL HISTORY:  Past Medical History:  Diagnosis Date   Anemia    Arthritis    Ostearthritis- hips, knees, fingers   Basal cell carcinoma (BCC) of right hand 10/2021   Dx by [facility]   DVT (deep venous thrombosis) (HCC)    Dyspnea    GERD (gastroesophageal reflux disease)    Headache(784.0)    tx. Valproic acid   History of diabetes mellitus    History of kidney stones    Hypertension     Hypothyroidism    Pulmonary embolism [facility])    Past Surgical History:  Procedure Laterality Date   ABDOMINAL HYSTERECTOMY     BILATERAL HIP ARTHROSCOPY Left    BIOPSY  12/08/2019   Procedure: BIOPSY;  Surgeon: Daneil Dolin, MD;  [facility];  Service: Endoscopy;;   CATARACT EXTRACTION, BILATERAL     COLONOSCOPY N/A 12/08/2019   polyps (tubular adenoma), diverticulosis, colonic lipoma, no surveillance due to age   4 W/ [place] Right 12/25/2020   Procedure: CYSTOSCOPY WITH RETROGRADE PYELOGRAM/URETERAL STENT PLACEMENT;  Surgeon: Janith Lima, MD;  Location: [facility];  Service: Urology;  Laterality: Right;   CYSTOSCOPY/URETEROSCOPY/HOLMIUM LASER/STENT PLACEMENT Right 01/15/2021  Procedure: CYSTOSCOPY/RETROGRADE/URETEROSCOPY/HOLMIUM LASER/STENT EXCHANGE;  Surgeon: Janith Lima, MD;  Location: [facility];  Service: Urology;  Laterality: Right;   ESOPHAGOGASTRODUODENOSCOPY N/A 12/08/2019   normal esophagus with 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   ESOPHAGOGASTRODUODENOSCOPY (EGD) WITH PROPOFOL N/A 05/18/2021   Surgeon: Clarene Essex, MD;   Tiny hiatal hernia, gastric antral vascular ectasia s/p ablation, normal duodenum, normal jejunum.   ESOPHAGOGASTRODUODENOSCOPY (EGD) WITH PROPOFOL N/A 03/10/2022   Procedure: ESOPHAGOGASTRODUODENOSCOPY (EGD) WITH PROPOFOL;  Surgeon: Eloise Harman, DO;  [facility];  Service: Endoscopy;  Laterality: N/A;  10:45am   GIVENS CAPSULE STUDY N/A 01/13/2020   Procedure: GIVENS CAPSULE STUDY;  Surgeon: Daneil Dolin, MD;  [facility];  Service: Endoscopy;  Laterality: N/A;  7:30am   GIVENS CAPSULE STUDY N/A 05/04/2021   Procedure: GIVENS CAPSULE STUDY;  Surgeon: Clarene Essex, MD;  [facility];  Service: Endoscopy;  Laterality: N/A;   MULTIPLE TOOTH EXTRACTIONS     60's   PARATHYROIDECTOMY     POLYPECTOMY  12/08/2019   Procedure: POLYPECTOMY;  Surgeon:  Daneil Dolin, MD;  [facility];  Service: Endoscopy;;   RADIOFREQUENCY ABLATION  05/18/2021   Procedure: RADIO FREQUENCY ABLATION;  Surgeon: Clarene Essex, MD;  [facility];  Service: Endoscopy;;   TEE WITHOUT CARDIOVERSION N/A 07/06/2021   Procedure: TRANSESOPHAGEAL ECHOCARDIOGRAM (TEE);  Surgeon: Donato Heinz, MD;  Location: [facility] ENDOSCOPY;  Service: Cardiovascular;  Laterality: N/A;   TOTAL HIP ARTHROPLASTY Right 10/29/2013   Procedure: RIGHT TOTAL HIP ARTHROPLASTY ANTERIOR APPROACH;  Surgeon: Mauri Pole, MD;  Location: [facility];  Service: Orthopedics;  Laterality: Right;    SOCIAL HISTORY:  Social History   Socioeconomic History   Marital status: Widowed    Spouse name: Not on file   Number of children: 4   Years of education: 12   Highest education level: High school graduate  Occupational History   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 She has four adult children and one son lives with her.       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   Social Determinants of Health   Financial Resource Strain: High Risk (03/11/2022)   Overall Financial Resource Strain (CARDIA)    Difficulty of Paying Living Expenses: Very hard  Food Insecurity: No Food Insecurity (04/18/2022)   Hunger Vital Sign    Worried About Running Out of Food in the Last Year: Never true    Ran Out of Food in the Last Year: Never true  Transportation Needs: No Transportation Needs (04/18/2022)   PRAPARE - Hydrologist (Medical): No    Lack of Transportation (Non-Medical): No  Physical  Activity: Insufficiently Active (12/23/2021)   Exercise Vital Sign    Days of Exercise per Week: 7 days    Minutes of Exercise per Session: 20 min  Stress: No Stress Concern Present (03/04/2022)   [place]    Feeling  of Stress : Only a little  Social Connections: Moderately Integrated (12/23/2021)   Social Connection and Isolation Panel [NHANES]    Frequency of Communication with Friends and Family: More than three times a week    Frequency of Social Gatherings with Friends and Family: More than three times a week    Attends Religious Services: More than 4 times per year    Active Member of [employer] or Organizations: Yes    Attends Archivist Meetings: More than 4 times per year    Marital Status: Widowed  Intimate Partner Violence: Not At Risk (03/04/2022)   Humiliation, Afraid, Rape, and Kick questionnaire    Fear of Current or Ex-Partner: No    Emotionally Abused: No    Physically Abused: No    Sexually Abused: No    FAMILY HISTORY:  Family History  Problem Relation Age of Onset   Colitis Sister 75       alive   Cancer Sister 10       colon   Cancer Mother 66       uterine   Heart disease Father 61       heart failure   Heart attack Brother 50   Healthy Daughter    Healthy Son    Pulmonary embolism Brother 46   Heart disease Brother    Healthy Son    Healthy Son     CURRENT MEDICATIONS:  Outpatient Encounter Medications as of 09/27/2022  Medication Sig   acetaminophen (TYLENOL) 325 MG tablet Take 2 tablets (650 mg total) by mouth every 6 (six) hours as needed for mild pain or headache (or Fever >/= 101).   allopurinol (ZYLOPRIM) 100 MG tablet Take 1 tablet (100 mg total) by mouth daily.   amLODipine (NORVASC) 2.5 MG tablet Take 1 tablet (2.5 mg total) by mouth daily.   apixaban (ELIQUIS) 5 MG TABS tablet Take 1 tablet (5 mg total) by mouth 2 (two) times daily.   atorvastatin (LIPITOR) 20 MG tablet  Take 1 tablet by mouth once daily   dapagliflozin propanediol (FARXIGA) 10 MG TABS tablet Take 1 tablet (10 mg total) by mouth daily.   furosemide (LASIX) 20 MG tablet Take 1 tablet (20 mg total) by mouth daily.   GNP CRANBERRY EXTRACT PO Take 500 mg by mouth in the morning.   levothyroxine (SYNTHROID) 50 MCG tablet TAKE 1 TABLET BY MOUTH ONCE DAILY BEFORE BREAKFAST   lisinopril (ZESTRIL) 2.5 MG tablet Take 1 tablet (2.5 mg total) by mouth daily.   loratadine (CLARITIN) 10 MG tablet Take 10 mg by mouth daily.   oxybutynin (DITROPAN-XL) 10 MG 24 hr tablet Take 1 tablet (10 mg total) by mouth at bedtime.   pantoprazole (PROTONIX) 40 MG tablet Take 1 tablet (40 mg total) by mouth 2 (two) times daily before a meal.   polyethylene glycol (MIRALAX / GLYCOLAX) 17 g packet Take 17 g by mouth daily as needed for mild constipation.   vitamin B-12 (CYANOCOBALAMIN) 50 MCG tablet Take 50 mcg by mouth daily.   No facility-administered encounter medications on file as of 09/27/2022.    ALLERGIES:  No Known Allergies  LABORATORY DATA:  I have reviewed the labs as listed.  CBC    Component Value Date/Time   WBC 5.7 06/28/2022 0847   RBC 4.00 06/28/2022 0847   HGB 11.8 (L) 06/28/2022 0847   HGB 11.8 05/09/2022 1028   HCT 36.9 06/28/2022 0847   HCT 38.3 05/09/2022 1028   PLT 212  06/28/2022 0847   PLT 246 05/09/2022 1028   MCV 92.3 06/28/2022 0847   MCV 96 05/09/2022 1028   MCH 29.5 06/28/2022 0847   MCHC 32.0 06/28/2022 0847   RDW 15.1 06/28/2022 0847   RDW 13.6 05/09/2022 1028   LYMPHSABS 1.3 06/28/2022 0847   LYMPHSABS 1.0 05/09/2022 1028   MONOABS 0.7 06/28/2022 0847   EOSABS 0.2 06/28/2022 0847   EOSABS 0.2 05/09/2022 1028   BASOSABS 0.0 06/28/2022 0847   BASOSABS 0.0 05/09/2022 1028      Latest Ref Rng & Units 07/08/2022    2:22 PM 05/09/2022   10:28 AM 02/17/2022   12:15 PM  CMP  Glucose 70 - 99 mg/dL 105  96  109   BUN 6 - 23 mg/dL '26  23  18   '$ Creatinine 0.40 - 1.20 mg/dL 1.18   1.34  1.15   Sodium 135 - 145 mEq/L 141  143  139   Potassium 3.5 - 5.1 mEq/L 3.9  3.9  4.5   Chloride 96 - 112 mEq/L 107  108  104   CO2 19 - 32 mEq/L '27  22  22   '$ Calcium 8.4 - 10.5 mg/dL 12.0  11.2  10.5   Total Protein 6.0 - 8.5 g/dL  6.3    Total Bilirubin 0.0 - 1.2 mg/dL  0.2    Alkaline Phos 44 - 121 IU/L  109    AST 0 - 40 IU/L  14    ALT 0 - 32 IU/L  13      DIAGNOSTIC IMAGING:  I have independently reviewed the relevant imaging and discussed with the patient.   WRAP UP:  All questions were answered. The patient knows to call the clinic with any problems, questions or concerns.  Medical decision making: Moderate  Time spent on visit: I spent 20 minutes counseling the patient face to face. The total time spent in the appointment was 30 minutes and more than 50% was on counseling.  Harriett Rush, PA-C  09/27/2022 11:03 AM

## 2022-09-27 NOTE — Patient Instructions (Signed)
[facility] **   You were seen today by Tarri Abernethy PA-C for your iron deficiency anemia.    IRON DEFICIENCY ANEMIA: This is from your chronic blood loss from your stomach. After today's visit, you we will have received 2 units of IV iron. We will recheck your iron levels and see you for phone visit in 3 months. Continue iron tablet ONCE daily.   ** Thank you for trusting me with your healthcare!  I strive to provide all of my patients with quality care at each visit.  If you receive a survey for this visit, I would be so grateful to you for taking the time to provide feedback.  Thank you in advance!  ~ Eneida Evers                   Dr. Derek Jack   &   Tarri Abernethy, PA-C   - - - - - - - - - - - - - - - - - -    Thank you for choosing [facility] to provide your oncology and hematology care.  To afford each patient quality time with our provider, please arrive at least 15 minutes before your scheduled appointment time.   If you have a lab appointment with the [facility] please come in thru the Main Entrance and check in at the main information desk.  You need to re-schedule your appointment should you arrive 10 or more minutes late.  We strive to give you quality time with our providers, and arriving late affects you and other patients whose appointments are after yours.  Also, if you no show three or more times for appointments you may be dismissed from the clinic at the providers discretion.     Again, thank you for choosing [facility]  Our hope is that these requests will decrease the amount of time that you wait before being seen by our physicians.       _____________________________________________________________  Should you have questions after your visit to [facility], please contact our office at [phone] and  follow the prompts.  Our office hours are 8:00 a.m. and 4:30 p.m. Monday - Friday.  Please note that voicemails left after 4:00 p.m. may not be returned until the following business day.  We are closed weekends and major holidays.  You do have access to a nurse 24-7, just call the main number to the clinic [phone] and do not press any options, hold on the line and a nurse will answer the phone.    For prescription refill requests, have your pharmacy contact our office and allow 72 hours.

## 2022-09-29 ENCOUNTER — Telehealth: Payer: Self-pay

## 2022-09-29 NOTE — Telephone Encounter (Signed)
Patient calling to follow up on referral that was placed to Dr. Harlow Asa in January.  She has not heard anything.

## 2022-10-03 ENCOUNTER — Inpatient Hospital Stay: Payer: PPO

## 2022-10-03 VITALS — BP 179/64 | HR 49 | Temp 97.0°F | Resp 17

## 2022-10-03 DIAGNOSIS — D500 Iron deficiency anemia secondary to blood loss (chronic): Secondary | ICD-10-CM

## 2022-10-03 MED ORDER — SODIUM CHLORIDE 0.9 % IV SOLN
510.0000 mg | Freq: Once | INTRAVENOUS | Status: AC
  Administered 2022-10-03: 510 mg via INTRAVENOUS
  Filled 2022-10-03: qty 510

## 2022-10-03 MED ORDER — SODIUM CHLORIDE 0.9 % IV SOLN: Freq: Once | INTRAVENOUS | Status: AC

## 2022-10-03 NOTE — Patient Instructions (Signed)
[facility]  Discharge Instructions: Thank you for choosing [facility] to provide your oncology and hematology care.  If you have a lab appointment with the Cancer Center, please come in thru the Main Entrance and check in at the main information desk.  Wear comfortable clothing and clothing appropriate for easy access to any Portacath or PICC line.   We strive to give you quality time with your provider. You may need to reschedule your appointment if you arrive late (15 or more minutes).  Arriving late affects you and other patients whose appointments are after yours.  Also, if you miss three or more appointments without notifying the office, you may be dismissed from the clinic at the provider's discretion.      For prescription refill requests, have your pharmacy contact our office and allow 72 hours for refills to be completed.    Today you received the following chemotherapy and/or immunotherapy agents Feraheme. Ferumoxytol Injection What is this medication? FERUMOXYTOL (FER ue MOX i tol) treats low levels of iron in your body (iron deficiency anemia). Iron is a mineral that plays an important role in making red blood cells, which carry oxygen from your lungs to the rest of your body. This medicine may be used for other purposes; ask your health care provider or pharmacist if you have questions. COMMON BRAND NAME(S): Feraheme What should I tell my care team before I take this medication? They need to know if you have any of these conditions: Anemia not caused by low iron levels High levels of iron in the blood Magnetic resonance imaging (MRI) test scheduled An unusual or allergic reaction to iron, other medications, foods, dyes, or preservatives Pregnant or trying to get pregnant Breastfeeding How should I use this medication? This medication is injected into a vein. It is given by your care team in a hospital or clinic setting. Talk to your care  team the use of this medication in children. Special care may be needed. Overdosage: If you think you have taken too much of this medicine contact a poison control center or emergency room at once. NOTE: This medicine is only for you. Do not share this medicine with others. What if I miss a dose? It is important not to miss your dose. Call your care team if you are unable to keep an appointment. What may interact with this medication? Other iron products This list may not describe all possible interactions. Give your health care provider a list of all the medicines, herbs, non-prescription drugs, or dietary supplements you use. Also tell them if you smoke, drink alcohol, or use illegal drugs. Some items may interact with your medicine. What should I watch for while using this medication? Visit your care team regularly. Tell your care team if your symptoms do not start to get better or if they get worse. You may need blood work done while you are taking this medication. You may need to follow a special diet. Talk to your care team. Foods that contain iron include: whole grains/cereals, dried fruits, beans, or peas, leafy green vegetables, and organ meats (liver, kidney). What side effects may I notice from receiving this medication? Side effects that you should report to your care team as soon as possible: Allergic reactions--skin rash, itching, hives, swelling of the face, lips, tongue, or throat Low blood pressure--dizziness, feeling faint or lightheaded, blurry vision Shortness of breath Side effects that usually do not require medical attention (report to your   care team if they continue or are bothersome): Flushing Headache Joint pain Muscle pain Nausea Pain, redness, or irritation at injection site This list may not describe all possible side effects. Call your doctor for medical advice about side effects. You may report side effects to FDA at 1-800-FDA-1088. Where should I keep my  medication? This medication is given in a hospital or clinic and will not be stored at home. NOTE: This sheet is a summary. It may not cover all possible information. If you have questions about this medicine, talk to your doctor, pharmacist, or health care provider.  2023 Elsevier/Gold Standard (2020-12-02 00:00:00)       To help prevent nausea and vomiting after your treatment, we encourage you to take your nausea medication as directed.  BELOW ARE SYMPTOMS THAT SHOULD BE REPORTED IMMEDIATELY: *FEVER GREATER THAN 100.4 F (38 C) OR HIGHER *CHILLS OR SWEATING *NAUSEA AND VOMITING THAT IS NOT CONTROLLED WITH YOUR NAUSEA MEDICATION *UNUSUAL SHORTNESS OF BREATH *UNUSUAL BRUISING OR BLEEDING *URINARY PROBLEMS (pain or burning when urinating, or frequent urination) *BOWEL PROBLEMS (unusual diarrhea, constipation, pain near the anus) TENDERNESS IN MOUTH AND THROAT WITH OR WITHOUT PRESENCE OF ULCERS (sore throat, sores in mouth, or a toothache) UNUSUAL RASH, SWELLING OR PAIN  UNUSUAL VAGINAL DISCHARGE OR ITCHING   Items with * indicate a potential emergency and should be followed up as soon as possible or go to the Emergency Department if any problems should occur.  Please show the CHEMOTHERAPY ALERT CARD or IMMUNOTHERAPY ALERT CARD at check-in to the Emergency Department and triage nurse.  Should you have questions after your visit or need to cancel or reschedule your appointment, please contact [facility] AT  [phone]  and follow the prompts.  Office hours are 8:00 a.m. to 4:30 p.m. Monday - Friday. Please note that voicemails left after 4:00 p.m. may not be returned until the following business day.  We are closed weekends and major holidays. You have access to a nurse at all times for urgent questions. Please call the main number to the clinic [phone] and follow the prompts.  For any non-urgent questions, you may also contact your provider using MyChart. We now  offer e-Visits for anyone 18 and older to request care online for non-urgent symptoms. For details visit [email or web address].   Also download the MyChart app! Go to the app store, search "MyChart", open the app, select [facility], and log in with your MyChart username and password.   

## 2022-10-03 NOTE — Progress Notes (Signed)
Patient presents today for Feraheme infusion. Vital signs stable. Patient has no complaints of any side effects related to iron infusions. Patient states, " I have more energy since the last infusion." Patient took pre-medications prior to arrival at 1300 pm. 10 mg PO Claritin and Tylenol 650 mg PO.   Patient unable to wait for 30 minute post monitoring after Feraheme infusion. Patient teaching performed and understanding verified.   Feraheme given today per MD orders. Tolerated infusion without adverse affects. Blood pressure elevated at discharge. Patient instructed to follow up with PCP. Understanding verbalized.  No complaints at this time. Discharged from clinic ambulatory in stable condition. Alert and oriented x 3. F/U with [facility] as scheduled.

## 2022-10-05 ENCOUNTER — Telehealth: Payer: Self-pay

## 2022-10-05 NOTE — Telephone Encounter (Signed)
Patient calls to report that her blood pressure was elevated at the cancer center today, 179/62.  She also states it has been elevated at home, but not that much.  She has noticed that the Lisinopril is also making her cough.  An appointment was scheduled to see Marjorie Smolder on 10/07/22 at 9:15 am.

## 2022-10-07 ENCOUNTER — Encounter: Payer: Self-pay | Admitting: Family Medicine

## 2022-10-07 ENCOUNTER — Ambulatory Visit (INDEPENDENT_AMBULATORY_CARE_PROVIDER_SITE_OTHER): Payer: PPO | Admitting: Family Medicine

## 2022-10-07 VITALS — BP 157/60 | HR 61 | Temp 98.2°F | Ht 63.0 in | Wt 201.0 lb

## 2022-10-07 DIAGNOSIS — N1831 Chronic kidney disease, stage 3a: Secondary | ICD-10-CM

## 2022-10-07 DIAGNOSIS — I129 Hypertensive chronic kidney disease with stage 1 through stage 4 chronic kidney disease, or unspecified chronic kidney disease: Secondary | ICD-10-CM | POA: Diagnosis not present

## 2022-10-07 DIAGNOSIS — I10 Essential (primary) hypertension: Secondary | ICD-10-CM

## 2022-10-07 MED ORDER — LOSARTAN POTASSIUM 25 MG PO TABS: 25.0000 mg | ORAL_TABLET | Freq: Every day | ORAL | 0 refills | Status: DC

## 2022-10-07 NOTE — Progress Notes (Signed)
Established Patient Office Visit  Subjective   Patient ID: [name] [name], female    DOB: 25-Nov-1940  Age: 82 y.o. MRN: [id number]  Chief Complaint  Patient presents with   Hypertension    HPI  HTN Complaint with meds - Yes Current Medications - lisinopril 2.5 mg, amlodipine 2.5 mg Checking BP at home ranging - home cuff is inaccurate Pertinent ROS:  Fatigue - No Visual Disturbances - No Chest pain - No Dyspnea - chronic with activity, baseline Palpitations - No LE edema - No  She reports that lisinopril causes a dry cough. She was started on this for kidney protection. She has been on losartan previously and did well with that.   BP Readings from Last 3 Encounters:  10/07/22 (!) 157/60  10/03/22 (!) 179/64  09/27/22 (!) 157/60      Latest Ref Rng & Units 07/08/2022    2:22 PM 05/09/2022   10:28 AM 02/17/2022   12:15 PM  CMP  Glucose 70 - 99 mg/dL 105  96  109   BUN 6 - 23 mg/dL 26  23  18    Creatinine 0.40 - 1.20 mg/dL 1.18  1.34  1.15   Sodium 135 - 145 mEq/L 141  143  139   Potassium 3.5 - 5.1 mEq/L 3.9  3.9  4.5   Chloride 96 - 112 mEq/L 107  108  104   CO2 19 - 32 mEq/L 27  22  22    Calcium 8.4 - 10.5 mg/dL 12.0  11.2  10.5   Total Protein 6.0 - 8.5 g/dL  6.3    Total Bilirubin 0.0 - 1.2 mg/dL  0.2    Alkaline Phos 44 - 121 IU/L  109    AST 0 - 40 IU/L  14    ALT 0 - 32 IU/L  13        Past Medical History:  Diagnosis Date   Anemia    Arthritis    Ostearthritis- hips, knees, fingers   Basal cell carcinoma (BCC) of right hand 10/2021   Dx by [facility]   DVT (deep venous thrombosis) (HCC)    Dyspnea    GERD (gastroesophageal reflux disease)    Headache(784.0)    tx. Valproic acid   History of diabetes mellitus    History of kidney stones    Hypertension    Hypothyroidism    Pulmonary embolism (HCC)       ROS As per HPI.   Objective:     BP (!) 157/60   Pulse 61   Temp 98.2 F (36.8 C) (Temporal)   Ht 5\' 3"  (1.6 m)   Wt  201 lb (91.2 kg)   SpO2 98%   BMI 35.61 kg/m    Physical Exam Vitals and nursing note reviewed.  Constitutional:      General: She is not in acute distress.    Appearance: She is obese. She is not ill-appearing, toxic-appearing or diaphoretic.  Cardiovascular:     Rate and Rhythm: Normal rate. Rhythm irregular.     Heart sounds: Normal heart sounds. No murmur heard. Pulmonary:     Effort: Pulmonary effort is normal. No respiratory distress.     Breath sounds: Normal breath sounds.  Musculoskeletal:     Cervical back: Neck supple. No rigidity.     Right lower leg: No edema.     Left lower leg: No edema.  Skin:    General: Skin is warm.  Neurological:  General: No focal deficit present.     Mental Status: She is alert and oriented to person, place, and time.  Psychiatric:        Mood and Affect: Mood normal.        Behavior: Behavior normal.      No results found for any visits on 10/07/22.    The ASCVD Risk score (Arnett DK, et al., 2019) failed to calculate for the following reasons:   The 2019 ASCVD risk score is only valid for ages 68 to 59    Assessment & Plan:   Valentyna was seen today for hypertension.  Diagnoses and all orders for this visit:  Primary hypertension Not at goal. Will switch from lisinopril to losartan due to cough. Will follow up in 2 weeks for recheck and labs.  -     losartan (COZAAR) 25 MG tablet; Take 1 tablet (25 mg total) by mouth daily.  Stage 3a chronic kidney disease (HCC) -     losartan (COZAAR) 25 MG tablet; Take 1 tablet (25 mg total) by mouth daily.  Keep chronic follow up appt. Return sooner for new or worsening symptoms.   The patient indicates understanding of these issues and agrees with the plan.  [name], FNP

## 2022-10-10 ENCOUNTER — Inpatient Hospital Stay: Payer: PPO

## 2022-10-10 ENCOUNTER — Other Ambulatory Visit: Payer: Self-pay | Admitting: Cardiology

## 2022-10-10 VITALS — BP 147/51 | HR 41 | Temp 96.9°F | Resp 18

## 2022-10-10 DIAGNOSIS — D500 Iron deficiency anemia secondary to blood loss (chronic): Secondary | ICD-10-CM | POA: Diagnosis not present

## 2022-10-10 MED ORDER — SODIUM CHLORIDE 0.9 % IV SOLN
510.0000 mg | Freq: Once | INTRAVENOUS | Status: AC
  Administered 2022-10-10: 510 mg via INTRAVENOUS
  Filled 2022-10-10: qty 510

## 2022-10-10 MED ORDER — SODIUM CHLORIDE 0.9 % IV SOLN: Freq: Once | INTRAVENOUS | Status: AC

## 2022-10-10 NOTE — Progress Notes (Signed)
Pt presents today for Feraheme IV iron infusion. Vital signs stable and pt voiced no new complaints at this time.  Pt took pre-meds at home prior to arrival. Peripheral IV started with good blood return pre and post infusion.  Feraheme given today per MD orders. Tolerated infusion without adverse affects. Vital signs stable. No complaints at this time. Discharged from clinic via wheelchair  in stable condition. Alert and oriented x 3. F/U with [facility] as scheduled.

## 2022-10-10 NOTE — Patient Instructions (Signed)
[facility] [place]  Discharge Instructions: Thank you for choosing [facility] to provide your oncology and hematology care.  If you have a lab appointment with the Cancer Center, please come in thru the Main Entrance and check in at the main information desk.  Wear comfortable clothing and clothing appropriate for easy access to any Portacath or PICC line.   We strive to give you quality time with your provider. You may need to reschedule your appointment if you arrive late (15 or more minutes).  Arriving late affects you and other patients whose appointments are after yours.  Also, if you miss three or more appointments without notifying the office, you may be dismissed from the clinic at the provider's discretion.      For prescription refill requests, have your pharmacy contact our office and allow 72 hours for refills to be completed.    Today you received Feraheme IV iron infusion.     BELOW ARE SYMPTOMS THAT SHOULD BE REPORTED IMMEDIATELY: *FEVER GREATER THAN 100.4 F (38 C) OR HIGHER *CHILLS OR SWEATING *NAUSEA AND VOMITING THAT IS NOT CONTROLLED WITH YOUR NAUSEA MEDICATION *UNUSUAL SHORTNESS OF BREATH *UNUSUAL BRUISING OR BLEEDING *URINARY PROBLEMS (pain or burning when urinating, or frequent urination) *BOWEL PROBLEMS (unusual diarrhea, constipation, pain near the anus) TENDERNESS IN MOUTH AND THROAT WITH OR WITHOUT PRESENCE OF ULCERS (sore throat, sores in mouth, or a toothache) UNUSUAL RASH, SWELLING OR PAIN  UNUSUAL VAGINAL DISCHARGE OR ITCHING   Items with * indicate a potential emergency and should be followed up as soon as possible or go to the Emergency Department if any problems should occur.  Please show the CHEMOTHERAPY ALERT CARD or IMMUNOTHERAPY ALERT CARD at check-in to the Emergency Department and triage nurse.  Should you have questions after your visit or need to cancel or reschedule your appointment, please contact [facility] [phone]  and follow the prompts.  Office hours are 8:00 a.m. to 4:30 p.m. Monday - Friday. Please note that voicemails left after 4:00 p.m. may not be returned until the following business day.  We are closed weekends and major holidays. You have access to a nurse at all times for urgent questions. Please call the main number to the clinic [phone] and follow the prompts.  For any non-urgent questions, you may also contact your provider using MyChart. We now offer e-Visits for anyone 18 and older to request care online for non-urgent symptoms. For details visit [email or web address].   Also download the MyChart app! Go to the app store, search "MyChart", open the app, select [place], and log in with your MyChart username and password.   

## 2022-10-20 ENCOUNTER — Ambulatory Visit (INDEPENDENT_AMBULATORY_CARE_PROVIDER_SITE_OTHER): Payer: PPO | Admitting: Family Medicine

## 2022-10-20 ENCOUNTER — Encounter: Payer: Self-pay | Admitting: Family Medicine

## 2022-10-20 VITALS — BP 125/85 | HR 68 | Temp 97.9°F | Ht 63.0 in | Wt 201.1 lb

## 2022-10-20 DIAGNOSIS — I482 Chronic atrial fibrillation, unspecified: Secondary | ICD-10-CM | POA: Diagnosis not present

## 2022-10-20 DIAGNOSIS — R7303 Prediabetes: Secondary | ICD-10-CM | POA: Diagnosis not present

## 2022-10-20 DIAGNOSIS — I5020 Unspecified systolic (congestive) heart failure: Secondary | ICD-10-CM

## 2022-10-20 DIAGNOSIS — E785 Hyperlipidemia, unspecified: Secondary | ICD-10-CM | POA: Insufficient documentation

## 2022-10-20 DIAGNOSIS — N1831 Chronic kidney disease, stage 3a: Secondary | ICD-10-CM

## 2022-10-20 DIAGNOSIS — I10 Essential (primary) hypertension: Secondary | ICD-10-CM

## 2022-10-20 DIAGNOSIS — E210 Primary hyperparathyroidism: Secondary | ICD-10-CM

## 2022-10-20 DIAGNOSIS — I130 Hypertensive heart and chronic kidney disease with heart failure and stage 1 through stage 4 chronic kidney disease, or unspecified chronic kidney disease: Secondary | ICD-10-CM

## 2022-10-20 DIAGNOSIS — E1169 Type 2 diabetes mellitus with other specified complication: Secondary | ICD-10-CM | POA: Insufficient documentation

## 2022-10-20 DIAGNOSIS — D500 Iron deficiency anemia secondary to blood loss (chronic): Secondary | ICD-10-CM | POA: Diagnosis not present

## 2022-10-20 DIAGNOSIS — E039 Hypothyroidism, unspecified: Secondary | ICD-10-CM | POA: Diagnosis not present

## 2022-10-20 DIAGNOSIS — E782 Mixed hyperlipidemia: Secondary | ICD-10-CM

## 2022-10-20 DIAGNOSIS — Z6835 Body mass index (BMI) 35.0-35.9, adult: Secondary | ICD-10-CM

## 2022-10-20 DIAGNOSIS — K219 Gastro-esophageal reflux disease without esophagitis: Secondary | ICD-10-CM | POA: Diagnosis not present

## 2022-10-20 LAB — CMP14+EGFR
ALT: 12 IU/L (ref 0–32)
AST: 11 IU/L (ref 0–40)
Albumin/Globulin Ratio: 2.3 — ABNORMAL HIGH (ref 1.2–2.2)
Albumin: 4.3 g/dL (ref 3.7–4.7)
Alkaline Phosphatase: 101 IU/L (ref 44–121)
BUN/Creatinine Ratio: 29 — ABNORMAL HIGH (ref 12–28)
BUN: 44 mg/dL — ABNORMAL HIGH (ref 8–27)
Bilirubin Total: 0.2 mg/dL (ref 0.0–1.2)
CO2: 23 mmol/L (ref 20–29)
Calcium: 11.7 mg/dL — ABNORMAL HIGH (ref 8.7–10.3)
Chloride: 107 mmol/L — ABNORMAL HIGH (ref 96–106)
Creatinine, Ser: 1.52 mg/dL — ABNORMAL HIGH (ref 0.57–1.00)
Globulin, Total: 1.9 g/dL (ref 1.5–4.5)
Glucose: 99 mg/dL (ref 70–99)
Potassium: 4.5 mmol/L (ref 3.5–5.2)
Sodium: 142 mmol/L (ref 134–144)
Total Protein: 6.2 g/dL (ref 6.0–8.5)
eGFR: 34 mL/min/{1.73_m2} — ABNORMAL LOW (ref >59)

## 2022-10-20 LAB — BAYER DCA HB A1C WAIVED: HB A1C (BAYER DCA - WAIVED): 4.8 % (ref 4.8–5.6)

## 2022-10-20 MED ORDER — LOSARTAN POTASSIUM 25 MG PO TABS: 25.0000 mg | ORAL_TABLET | Freq: Every day | ORAL | 3 refills | Status: DC

## 2022-10-20 NOTE — Progress Notes (Signed)
Established Patient Office Visit  Subjective   Patient ID: [name] [name], female    DOB: May 15, 1941  Age: 82 y.o. MRN: [id number]  Chief Complaint  Patient presents with   Medical Management of Chronic Issues   Hypertension   Prediabetes    HPI  HTN Complaint with meds - Yes Current Medications - amlodipine 2.5, losartan 25 mg, lasix 20 mg Checking BP at home ranging 120-130s/60-80s Pertinent ROS:  Headache - No Fatigue - No Visual Disturbances - No Chest pain - No Dyspnea - No Palpitations - No LE edema - baseline  Cough has pretty much resolved with change from lisinopril.   2. HLD She is on atorvastatin. She is not fasting today. Last LDL was 86.  3. GERD Compliant with medications - Yes Current medications - protonix 40 mg Cough - No Sore throat - No Voice change - No Hemoptysis - No Dysphagia or dyspepsia - No Water brash - No Red Flags (weight loss, hematochezia, melena, weight loss, early satiety, fevers, odynophagia, or persistent vomiting) - No  4. Thyroid Managed by endo. Has been referred to Dr. Harlow Asa for surgery for elevated parathyroid.   5. Anemia Recently had 2 iron infusions. She improvement with this. Denies dizziness, lightheadedness, syncope, blood in stool.    Past Medical History:  Diagnosis Date   Anemia    Arthritis    Ostearthritis- hips, knees, fingers   Basal cell carcinoma (BCC) of right hand 10/2021   Dx by [facility]   DVT (deep venous thrombosis) (HCC)    Dyspnea    GERD (gastroesophageal reflux disease)    Headache(784.0)    tx. Valproic acid   History of diabetes mellitus    History of kidney stones    Hypertension    Hypothyroidism    Pulmonary embolism (HCC)       ROS As per HPI.    Objective:     BP 125/85 Comment: at home reading per pt  Pulse 68   Temp 97.9 F (36.6 C) (Temporal)   Ht 5\' 3"  (1.6 m)   Wt 201 lb 2 oz (91.2 kg)   SpO2 100%   BMI 35.63 kg/m    Physical Exam Vitals and  nursing note reviewed.  Constitutional:      General: She is not in acute distress.    Appearance: She is obese. She is not ill-appearing, toxic-appearing or diaphoretic.  Neck:     Vascular: No carotid bruit.  Cardiovascular:     Rate and Rhythm: Normal rate and regular rhythm.     Heart sounds: Normal heart sounds. No murmur heard. Pulmonary:     Effort: Pulmonary effort is normal. No respiratory distress.     Breath sounds: Normal breath sounds. No wheezing or rhonchi.  Abdominal:     General: Bowel sounds are normal. There is no distension.     Palpations: Abdomen is soft.     Tenderness: There is no abdominal tenderness. There is no guarding or rebound.  Musculoskeletal:     Cervical back: Neck supple. No rigidity.     Right lower leg: No edema.     Left lower leg: No edema.  Skin:    General: Skin is warm and dry.  Neurological:     General: No focal deficit present.     Mental Status: She is alert and oriented to person, place, and time.  Psychiatric:        Mood and Affect: Mood normal.  Behavior: Behavior normal.        Thought Content: Thought content normal.        Judgment: Judgment normal.      No results found for any visits on 10/20/22.    The ASCVD Risk score (Arnett DK, et al., 2019) failed to calculate for the following reasons:   The 2019 ASCVD risk score is only valid for ages 34 to 74    Assessment & Plan:   Kairo was seen today for medical management of chronic issues, hypertension and prediabetes.  Diagnoses and all orders for this visit:  Primary hypertension Well controlled on current regimen. Labs pending. Cough has resolved with switch from ACE to ARB.  -     CMP14+EGFR -     losartan (COZAAR) 25 MG tablet; Take 1 tablet (25 mg total) by mouth daily.  Severe systolic congestive heart failure (HCC) Chronic a-fib ([place]) Managed by cardiology. Stable.   Prediabetes A1c pending.  -     Bayer DCA Hb A1c Waived  Mixed  hyperlipidemia Well controlled on current regimen.  Continue statin.   Stage 3a chronic kidney disease ([place]) Labs pending. Continue farxiga and ARB.  -     CMP14+EGFR -     losartan (COZAAR) 25 MG tablet; Take 1 tablet (25 mg total) by mouth daily.  Morbid obesity ([place]) Diet and exercise as able.   Primary hyperparathyroidism ([place]) Acquired hypothyroidism Managed by endo.   Iron deficiency anemia due to chronic blood loss Managed by hematology.   Gastroesophageal reflux disease without esophagitis Well controlled on current regimen. Continue protonix.    Return in about 6 months (around 04/22/2023) for chronic follow up.   The patient indicates understanding of these issues and agrees with the plan.  [name], FNP

## 2022-10-21 ENCOUNTER — Other Ambulatory Visit: Payer: Self-pay | Admitting: Family Medicine

## 2022-10-21 DIAGNOSIS — E785 Hyperlipidemia, unspecified: Secondary | ICD-10-CM

## 2022-10-28 ENCOUNTER — Telehealth: Payer: PPO | Admitting: Internal Medicine

## 2022-10-28 ENCOUNTER — Other Ambulatory Visit: Payer: Self-pay | Admitting: Family Medicine

## 2022-10-28 DIAGNOSIS — N1831 Chronic kidney disease, stage 3a: Secondary | ICD-10-CM

## 2022-11-04 ENCOUNTER — Ambulatory Visit (INDEPENDENT_AMBULATORY_CARE_PROVIDER_SITE_OTHER): Payer: PPO | Admitting: Family Medicine

## 2022-11-04 ENCOUNTER — Telehealth: Payer: Self-pay | Admitting: Family Medicine

## 2022-11-04 ENCOUNTER — Encounter: Payer: Self-pay | Admitting: Family Medicine

## 2022-11-04 VITALS — BP 149/74 | HR 64 | Temp 97.8°F | Ht 63.0 in | Wt 201.6 lb

## 2022-11-04 DIAGNOSIS — R310 Gross hematuria: Secondary | ICD-10-CM

## 2022-11-04 DIAGNOSIS — N3001 Acute cystitis with hematuria: Secondary | ICD-10-CM

## 2022-11-04 DIAGNOSIS — R319 Hematuria, unspecified: Secondary | ICD-10-CM | POA: Diagnosis not present

## 2022-11-04 LAB — MICROSCOPIC EXAMINATION
Epithelial Cells (non renal): NONE SEEN /hpf (ref 0–10)
Renal Epithel, UA: NONE SEEN /hpf

## 2022-11-04 LAB — URINALYSIS, ROUTINE W REFLEX MICROSCOPIC
Bilirubin, UA: NEGATIVE
Ketones, UA: NEGATIVE
Nitrite, UA: NEGATIVE
Specific Gravity, UA: 1.01 (ref 1.005–1.030)
Urobilinogen, Ur: 0.2 mg/dL (ref 0.2–1.0)
pH, UA: 5.5 (ref 5.0–7.5)

## 2022-11-04 MED ORDER — SULFAMETHOXAZOLE-TRIMETHOPRIM 800-160 MG PO TABS: 1.0000 | ORAL_TABLET | Freq: Two times a day (BID) | ORAL | 0 refills | Status: AC

## 2022-11-04 NOTE — Progress Notes (Signed)
Subjective:  Patient ID: [name] [name], female    DOB: Nov 02, 1940, 82 y.o.   MRN: [id number]  Patient Care Team: Gabriel Earing, FNP as PCP - General (Family Medicine) Rollene Rotunda, MD as PCP - Cardiology (Cardiology) Gelene Mink, NP (Gastroenterology) Rollene Rotunda, MD as Consulting Physician (Cardiology) Delora Fuel, OD (Optometry) Cresenciano Genre Lilla Shook, [facility] as Pharmacist (Family Medicine) Jodelle Gross, NP as Nurse Practitioner (Cardiology) [facility], Konrad Dolores, MD as Attending Physician (Endocrinology) Vida Rigger, MD as Consulting Physician (Gastroenterology) Jannifer Hick, MD as Consulting Physician (Urology) Clinton Gallant, RN as [facility] Management Doreatha Massed, MD as Consulting Physician (Hematology)   Chief Complaint:  Hematuria (Patient states she noticed blood in urine at 3:30 am this morning. )   HPI: [name] [name] is a 82 y.o. female presenting on 11/04/2022 for Hematuria (Patient states she noticed blood in urine at 3:30 am this morning. )   Pt presents toady she woke up around 0300 to go to the restroom. States when she voided there was blood in her urine. She did have slight dysuria but that has resolved. No other symptoms.   Hematuria This is a new problem. The current episode started today. She describes the hematuria as gross hematuria. She reports no clotting in her urine stream. She is experiencing no pain. Irritative symptoms do not include frequency, nocturia or urgency. Obstructive symptoms do not include dribbling, incomplete emptying, an intermittent stream, a slower stream, straining or a weak stream. Associated symptoms include dysuria. Pertinent negatives include no abdominal pain, bladder pain, bone pain, chills, facial swelling, fever, flank pain, genital pain, hematospermia, hesitancy, inability to urinate, nausea, urinary retention, vomiting or weight loss. She is not sexually active.      Relevant past medical, surgical, family, and social history reviewed and updated as indicated.  Allergies and medications reviewed and updated. Data reviewed: Chart in Epic.   Past Medical History:  Diagnosis Date   Anemia    Arthritis    Ostearthritis- hips, knees, fingers   Basal cell carcinoma (BCC) of right hand 10/2021   Dx by [facility]   DVT (deep venous thrombosis)    Dyspnea    GERD (gastroesophageal reflux disease)    Headache(784.0)    tx. Valproic acid   History of diabetes mellitus    History of kidney stones    Hypertension    Hypothyroidism    Pulmonary embolism     Past Surgical History:  Procedure Laterality Date   ABDOMINAL HYSTERECTOMY     BILATERAL HIP ARTHROSCOPY Left    BIOPSY  12/08/2019   Procedure: BIOPSY;  Surgeon: Corbin Ade, MD;  [facility];  Service: Endoscopy;;   CATARACT EXTRACTION, BILATERAL     COLONOSCOPY N/A 12/08/2019   polyps (tubular adenoma), diverticulosis, colonic lipoma, no surveillance due to age   CYSTOSCOPY W/ URETERAL STENT PLACEMENT Right 12/25/2020   Procedure: CYSTOSCOPY WITH RETROGRADE PYELOGRAM/URETERAL STENT PLACEMENT;  Surgeon: Jannifer Hick, MD;  Location: [facility];  Service: Urology;  Laterality: Right;   CYSTOSCOPY/URETEROSCOPY/HOLMIUM LASER/STENT PLACEMENT Right 01/15/2021   Procedure: CYSTOSCOPY/RETROGRADE/URETEROSCOPY/HOLMIUM LASER/STENT EXCHANGE;  Surgeon: Jannifer Hick, MD;  Location: [facility];  Service: Urology;  Laterality: Right;   ESOPHAGOGASTRODUODENOSCOPY N/A 12/08/2019   normal esophagus with 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   ESOPHAGOGASTRODUODENOSCOPY (EGD) WITH PROPOFOL N/A 05/18/2021   Surgeon: Vida Rigger, MD;   Tiny hiatal hernia, gastric antral vascular ectasia s/p  ablation, normal duodenum, normal jejunum.   ESOPHAGOGASTRODUODENOSCOPY (EGD) WITH PROPOFOL N/A 03/10/2022   Procedure: ESOPHAGOGASTRODUODENOSCOPY (EGD) WITH PROPOFOL;   Surgeon: Lanelle Bal, DO;  [facility];  Service: Endoscopy;  Laterality: N/A;  10:45am   GIVENS CAPSULE STUDY N/A 01/13/2020   Procedure: GIVENS CAPSULE STUDY;  Surgeon: Corbin Ade, MD;  [facility];  Service: Endoscopy;  Laterality: N/A;  7:30am   GIVENS CAPSULE STUDY N/A 05/04/2021   Procedure: GIVENS CAPSULE STUDY;  Surgeon: Vida Rigger, MD;  [facility];  Service: Endoscopy;  Laterality: N/A;   MULTIPLE TOOTH EXTRACTIONS     60's   PARATHYROIDECTOMY     POLYPECTOMY  12/08/2019   Procedure: POLYPECTOMY;  Surgeon: Corbin Ade, MD;  [facility];  Service: Endoscopy;;   RADIOFREQUENCY ABLATION  05/18/2021   Procedure: RADIO FREQUENCY ABLATION;  Surgeon: Vida Rigger, MD;  [facility];  Service: Endoscopy;;   TEE WITHOUT CARDIOVERSION N/A 07/06/2021   Procedure: TRANSESOPHAGEAL ECHOCARDIOGRAM (TEE);  Surgeon: Little Ishikawa, MD;  Location: [facility] ENDOSCOPY;  Service: Cardiovascular;  Laterality: N/A;   TOTAL HIP ARTHROPLASTY Right 10/29/2013   Procedure: RIGHT TOTAL HIP ARTHROPLASTY ANTERIOR APPROACH;  Surgeon: Shelda Pal, MD;  Location: [facility];  Service: Orthopedics;  Laterality: Right;    Social History   Socioeconomic History   Marital status: Widowed    Spouse name: Not on file   Number of children: 4   Years of education: 97   Highest education level: 12th grade  Occupational History   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 She has four adult children and one son lives with her.       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    Social Determinants of Health   Financial Resource Strain: High Risk (03/11/2022)   Overall Financial Resource Strain (CARDIA)    Difficulty of Paying Living Expenses: Very hard  Food Insecurity: No Food Insecurity (10/18/2022)   Hunger Vital Sign    Worried About Running Out of Food in the Last Year: Never true    Ran Out of Food in the Last Year: Never true  Transportation Needs: No Transportation Needs (10/18/2022)   PRAPARE - Administrator, Civil Service (Medical): No    Lack of Transportation (Non-Medical): No  Physical Activity: Insufficiently Active (10/18/2022)   Exercise Vital Sign    Days of Exercise per Week: 2 days    Minutes of Exercise per Session: 10 min  Stress: No Stress Concern Present (10/18/2022)   [employer] of Occupational Health - Occupational Stress Questionnaire    Feeling of Stress : Not at all  Social Connections: Unknown (10/18/2022)   Social Connection and Isolation Panel [NHANES]    Frequency of Communication with Friends and Family: More than three times a week    Frequency of Social Gatherings with Friends and Family: More than three times a week    Attends Religious Services: Patient declined    Active Member of [employer]  or Organizations: Patient declined    Attends Banker Meetings: More than 4 times per year    Marital Status: Widowed  Intimate Partner Violence: Not At Risk (03/04/2022)   Humiliation, Afraid, Rape, and Kick questionnaire    Fear of Current or Ex-Partner: No    Emotionally Abused: No    Physically Abused: No    Sexually Abused: No    Outpatient Encounter Medications as of 11/04/2022  Medication Sig   acetaminophen (TYLENOL) 325 MG tablet Take 2 tablets (650 mg total) by mouth every 6 (six) hours as needed for mild pain or headache (or Fever >/= 101).   allopurinol (ZYLOPRIM) 100 MG tablet Take 1 tablet (100 mg total) by mouth daily.   amLODipine (NORVASC) 2.5 MG tablet Take 1 tablet (2.5 mg total) by  mouth daily.   apixaban (ELIQUIS) 5 MG TABS tablet Take 1 tablet (5 mg total) by mouth 2 (two) times daily.   atorvastatin (LIPITOR) 20 MG tablet Take 1 tablet by mouth once daily   dapagliflozin propanediol (FARXIGA) 10 MG TABS tablet Take 1 tablet (10 mg total) by mouth daily.   Ferrous Sulfate (IRON PO) Take by mouth.   furosemide (LASIX) 20 MG tablet Take 1 tablet by mouth once daily   GNP CRANBERRY EXTRACT PO Take 500 mg by mouth in the morning.   levothyroxine (SYNTHROID) 50 MCG tablet TAKE 1 TABLET BY MOUTH ONCE DAILY BEFORE BREAKFAST   loratadine (CLARITIN) 10 MG tablet Take 10 mg by mouth daily.   losartan (COZAAR) 25 MG tablet Take 1 tablet (25 mg total) by mouth daily.   oxybutynin (DITROPAN-XL) 10 MG 24 hr tablet Take 1 tablet (10 mg total) by mouth at bedtime.   pantoprazole (PROTONIX) 40 MG tablet Take 1 tablet (40 mg total) by mouth 2 (two) times daily before a meal.   sulfamethoxazole-trimethoprim (BACTRIM DS) 800-160 MG tablet Take 1 tablet by mouth 2 (two) times daily for 7 days.   vitamin B-12 (CYANOCOBALAMIN) 50 MCG tablet Take 50 mcg by mouth daily.   No facility-administered encounter medications on file as of 11/04/2022.    No Known Allergies  Review of Systems  Constitutional:  Negative for activity change, appetite change, chills, diaphoresis, fatigue, fever, unexpected weight change and weight loss.  HENT:  Negative for facial swelling.   Gastrointestinal:  Negative for abdominal distention, abdominal pain, anal bleeding, blood in stool, constipation, diarrhea, nausea, rectal pain and vomiting.  Genitourinary:  Positive for dysuria and hematuria. Negative for decreased urine volume, difficulty urinating, enuresis, flank pain, frequency, hesitancy, incomplete emptying, nocturia, pelvic pain, urgency, vaginal bleeding, vaginal discharge and vaginal pain.  Musculoskeletal:  Negative for back pain.  Neurological:  Negative for dizziness, weakness and light-headedness.   Psychiatric/Behavioral:  Negative for confusion.   All other systems reviewed and are negative.       Objective:  BP (!) 149/74   Pulse 64   Temp 97.8 F (36.6 C) (Temporal)   Ht 5\' 3"  (1.6 m)   Wt 201 lb 9.6 oz (91.4 kg)   SpO2 96%   BMI 35.71 kg/m    Wt Readings from Last 3 Encounters:  11/04/22 201 lb 9.6 oz (91.4 kg)  10/20/22 201 lb 2 oz (91.2 kg)  10/07/22 201 lb (91.2 kg)    Physical Exam Vitals and nursing note reviewed.  Constitutional:      General: She is not in acute distress.    Appearance: Normal appearance. She is obese. She is not  ill-appearing, toxic-appearing or diaphoretic.  HENT:     Head: Normocephalic and atraumatic.     Mouth/Throat:     Mouth: Mucous membranes are moist.  Eyes:     Pupils: Pupils are equal, round, and reactive to light.  Cardiovascular:     Rate and Rhythm: Normal rate. Rhythm irregularly irregular.     Heart sounds: Normal heart sounds.  Skin:    General: Skin is warm and dry.  Neurological:     General: No focal deficit present.     Mental Status: She is alert and oriented to person, place, and time.     Gait: Gait abnormal (using cane).  Psychiatric:        Mood and Affect: Mood normal.        Behavior: Behavior normal.        Thought Content: Thought content normal.        Judgment: Judgment normal.     Results for orders placed or performed in visit on 10/20/22  Bayer DCA Hb A1c Waived  Result Value Ref Range   HB A1C (BAYER DCA - WAIVED) 4.8 4.8 - 5.6 %  CMP14+EGFR  Result Value Ref Range   Glucose 99 70 - 99 mg/dL   BUN 44 (H) 8 - 27 mg/dL   Creatinine, Ser 6.57 (H) 0.57 - 1.00 mg/dL   eGFR 34 (L) >84 ON/GEX/5.28   BUN/Creatinine Ratio 29 (H) 12 - 28   Sodium 142 134 - 144 mmol/L   Potassium 4.5 3.5 - 5.2 mmol/L   Chloride 107 (H) 96 - 106 mmol/L   CO2 23 20 - 29 mmol/L   Calcium 11.7 (H) 8.7 - 10.3 mg/dL   Total Protein 6.2 6.0 - 8.5 g/dL   Albumin 4.3 3.7 - 4.7 g/dL   Globulin, Total 1.9 1.5 - 4.5  g/dL   Albumin/Globulin Ratio 2.3 (H) 1.2 - 2.2   Bilirubin Total 0.2 0.0 - 1.2 mg/dL   Alkaline Phosphatase 101 44 - 121 IU/L   AST 11 0 - 40 IU/L   ALT 12 0 - 32 IU/L       Pertinent labs & imaging results that were available during my care of the patient were reviewed by me and considered in my medical decision making.  Assessment & Plan:  [name] [name] was seen today for hematuria.  Diagnoses and all orders for this visit:  Gross hematuria Urinalysis with leukocytes, protein, glucose, and blood. Culture pending.  -     Urinalysis, Routine w reflex microscopic; Future -     Urinalysis, Routine w reflex microscopic  Acute cystitis with hematuria Previous culture reviewed and antibiotic selection based off of results. Pt to come back for reevaluation in 2 weeks, sooner if worsening symptoms. May need to hold Eliquis and refer to urology for further evaluation.  -     sulfamethoxazole-trimethoprim (BACTRIM DS) 800-160 MG tablet; Take 1 tablet by mouth 2 (two) times daily for 7 days. -     Urine Culture     Continue all other maintenance medications.  Follow up plan: Return in about 2 weeks (around 11/18/2022), or if symptoms worsen or fail to improve, for urine recheck / ? referral to urology.   Continue healthy lifestyle choices, including diet (rich in fruits, vegetables, and lean proteins, and low in salt and simple carbohydrates) and exercise (at least 30 minutes of moderate physical activity daily).  Educational handout given for UTI  The above assessment and management plan was discussed with the  patient. The patient verbalized understanding of and has agreed to the management plan. Patient is aware to call the clinic if they develop any new symptoms or if symptoms persist or worsen. Patient is aware when to return to the clinic for a follow-up visit. Patient educated on when it is appropriate to go to the emergency department.   Kari Baars, FNP-C [facility] [phone]

## 2022-11-04 NOTE — Telephone Encounter (Signed)
Spoke with patient, rescheduled appointment to today at 2:50 pm with Kari Baars.

## 2022-11-07 ENCOUNTER — Ambulatory Visit: Payer: PPO

## 2022-11-07 ENCOUNTER — Ambulatory Visit: Payer: PPO | Admitting: Family Medicine

## 2022-11-07 LAB — URINE CULTURE

## 2022-11-09 ENCOUNTER — Ambulatory Visit: Payer: PPO | Admitting: Family Medicine

## 2022-11-09 ENCOUNTER — Telehealth: Payer: Self-pay | Admitting: *Deleted

## 2022-11-09 DIAGNOSIS — E213 Hyperparathyroidism, unspecified: Secondary | ICD-10-CM | POA: Diagnosis not present

## 2022-11-09 NOTE — Telephone Encounter (Signed)
   Pre-operative Risk Assessment    Patient Name: [name] [name]  DOB: 1941/01/26 MRN: [id number]      Request for Surgical Clearance    Procedure:   PARATHYROID SURGERY  Date of Surgery:  Clearance TBD                                 Surgeon:  Darnell Level, MD Surgeon's Group or Practice Name:  [facility] Phone number:  [phone] Fax number:  [phone]   Type of Clearance Requested:   - Medical  - Pharmacy:  Hold Apixaban (Eliquis) NOT INDICATED HOW LONG   Type of Anesthesia:  General    Additional requests/questions:    [name]   11/09/2022, 12:30 PM

## 2022-11-10 ENCOUNTER — Telehealth: Payer: Self-pay | Admitting: *Deleted

## 2022-11-10 NOTE — Telephone Encounter (Signed)
Primary Cardiologist:James Hochrein, MD   Preoperative team, please contact this patient and set up a phone call appointment for further preoperative risk assessment. Please obtain consent and complete medication review. Thank you for your help.   I confirm that guidance regarding antiplatelet and oral anticoagulation therapy has been completed and, if necessary, noted below.  Levi Aland, NP-C  11/10/2022, 11:51 AM [address] Office [phone] Fax [phone]

## 2022-11-10 NOTE — Telephone Encounter (Signed)
Patient with diagnosis of afib on Eliquis for anticoagulation.    Procedure: parathyroid surgery Date of procedure: TBD  CHA2DS2-VASc Score = 6  This indicates a 9.7% annual risk of stroke. The patient's score is based upon: CHF History: 1 HTN History: 1 Diabetes History: 1 Stroke History: 0 Vascular Disease History: 0 Age Score: 2 Gender Score: 1  Unprovoked DVT and PE in 03/2019  CrCl 71mL/min using adj body weight Platelet count 212K  Per office protocol, patient can hold Eliquis for 2 days prior to procedure.    **This guidance is not considered finalized until pre-operative APP has relayed final recommendations.**

## 2022-11-10 NOTE — Telephone Encounter (Signed)
Pt scheduled for tele pre op appt 11/14/22 @ 2 pm. Med rec and consent are done.     Patient Consent for Virtual Visit        [name] has provided verbal consent on 11/10/2022 for a virtual visit (video or telephone).   CONSENT FOR VIRTUAL VISIT FOR:  [name] [name]  By participating in this virtual visit I agree to the following:  I hereby voluntarily request, consent and authorize [facility] and its employed or contracted physicians, physician assistants, nurse practitioners or other licensed health care professionals (the Practitioner), to provide me with telemedicine health care services (the "Services") as deemed necessary by the treating Practitioner. I acknowledge and consent to receive the Services by the Practitioner via telemedicine. I understand that the telemedicine visit will involve communicating with the Practitioner through live audiovisual communication technology and the disclosure of certain medical information by electronic transmission. I acknowledge that I have been given the opportunity to request an in-person assessment or other available alternative prior to the telemedicine visit and am voluntarily participating in the telemedicine visit.  I understand that I have the right to withhold or withdraw my consent to the use of telemedicine in the course of my care at any time, without affecting my right to future care or treatment, and that the Practitioner or I may terminate the telemedicine visit at any time. I understand that I have the right to inspect all information obtained and/or recorded in the course of the telemedicine visit and may receive copies of available information for a reasonable fee.  I understand that some of the potential risks of receiving the Services via telemedicine include:  Delay or interruption in medical evaluation due to technological equipment failure or disruption; Information transmitted may not be sufficient (e.g.  poor resolution of images) to allow for appropriate medical decision making by the Practitioner; and/or  In rare instances, security protocols could fail, causing a breach of personal health information.  Furthermore, I acknowledge that it is my responsibility to provide information about my medical history, conditions and care that is complete and accurate to the best of my ability. I acknowledge that Practitioner's advice, recommendations, and/or decision may be based on factors not within their control, such as incomplete or inaccurate data provided by me or distortions of diagnostic images or specimens that may result from electronic transmissions. I understand that the practice of medicine is not an exact science and that Practitioner makes no warranties or guarantees regarding treatment outcomes. I acknowledge that a copy of this consent can be made available to me via my patient portal [facility] MyChart), or I can request a printed copy by calling the office of [facility].    I understand that my insurance will be billed for this visit.   I have read or had this consent read to me. I understand the contents of this consent, which adequately explains the benefits and risks of the Services being provided via telemedicine.  I have been provided ample opportunity to ask questions regarding this consent and the Services and have had my questions answered to my satisfaction. I give my informed consent for the services to be provided through the use of telemedicine in my medical care

## 2022-11-10 NOTE — Telephone Encounter (Signed)
Pt scheduled for tele pre op appt 11/14/22 @ 2 pm. Med rec and consent are done.

## 2022-11-14 NOTE — Progress Notes (Unsigned)
Virtual Visit via Telephone Note   Because of [name] [name]'s co-morbid illnesses, she is at least at moderate risk for complications without adequate follow up.  This format is felt to be most appropriate for this patient at this time.  The patient did not have access to video technology/had technical difficulties with video requiring transitioning to audio format only (telephone).  All issues noted in this document were discussed and addressed.  No physical exam could be performed with this format.  Please refer to the patient's chart for her consent to telehealth for [facility].  Evaluation Performed:  Preoperative cardiovascular risk assessment _____________   Date:  11/15/2022   Patient ID:  [name] [name], DOB 1940/09/15, MRN [id number] Patient Location:  Home Provider location:   Office  Primary Care Provider:  Gabriel Earing, FNP Primary Cardiologist:  Rollene Rotunda, MD  Chief Complaint / Patient Profile   82 y.o. y/o female with a h/o chronic HFrEF, moderate MR, moderate TR unprovoked DVT/PE 2020, atrial fibrillation, chronic anticoagulation, HTN who is pending parathyroid surgery and presents today for telephonic preoperative cardiovascular risk assessment.  History of Present Illness    [name] [name] is a 82 y.o. female who presents via audio/video conferencing for a telehealth visit today.  Pt was last seen in cardiology clinic on 09/07/22 by Dr. Antoine Poche.  At that time [name] [name] was doing well.  The patient is now pending procedure as outlined above. Since her last visit, she denies chest pain, lower extremity edema, fatigue, palpitations, melena, hematuria, hemoptysis, diaphoresis, weakness, presyncope, syncope. She reports chronic shortness of breath that she feels is stable. No edema, orthopnea, or PND. Weight is stable. She uses a walker and a cane at times due to chronic back and knee pain but is able to achieve > 4 METS activity  without concerning cardiac symptoms by working in her flower and vegetable garden and doing her housework.   Past Medical History    Past Medical History:  Diagnosis Date   Anemia    Arthritis    Ostearthritis- hips, knees, fingers   Basal cell carcinoma (BCC) of right hand 10/2021   Dx by [facility]   DVT (deep venous thrombosis)    Dyspnea    GERD (gastroesophageal reflux disease)    Headache(784.0)    tx. Valproic acid   History of diabetes mellitus    History of kidney stones    Hypertension    Hypothyroidism    Pulmonary embolism    Past Surgical History:  Procedure Laterality Date   ABDOMINAL HYSTERECTOMY     BILATERAL HIP ARTHROSCOPY Left    BIOPSY  12/08/2019   Procedure: BIOPSY;  Surgeon: Corbin Ade, MD;  [facility];  Service: Endoscopy;;   CATARACT EXTRACTION, BILATERAL     COLONOSCOPY N/A 12/08/2019   polyps (tubular adenoma), diverticulosis, colonic lipoma, no surveillance due to age   CYSTOSCOPY W/ URETERAL STENT PLACEMENT Right 12/25/2020   Procedure: CYSTOSCOPY WITH RETROGRADE PYELOGRAM/URETERAL STENT PLACEMENT;  Surgeon: Jannifer Hick, MD;  Location: [facility];  Service: Urology;  Laterality: Right;   CYSTOSCOPY/URETEROSCOPY/HOLMIUM LASER/STENT PLACEMENT Right 01/15/2021   Procedure: CYSTOSCOPY/RETROGRADE/URETEROSCOPY/HOLMIUM LASER/STENT EXCHANGE;  Surgeon: Jannifer Hick, MD;  Location: [facility];  Service: Urology;  Laterality: Right;   ESOPHAGOGASTRODUODENOSCOPY N/A 12/08/2019   normal esophagus with 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   ESOPHAGOGASTRODUODENOSCOPY (EGD) WITH PROPOFOL N/A 05/18/2021   Surgeon: Vida Rigger, MD;  Tiny hiatal hernia, gastric antral vascular ectasia s/p ablation, normal duodenum, normal jejunum.   ESOPHAGOGASTRODUODENOSCOPY (EGD) WITH PROPOFOL N/A 03/10/2022   Procedure: ESOPHAGOGASTRODUODENOSCOPY (EGD) WITH PROPOFOL;  Surgeon: Lanelle Bal, DO;  [facility];   Service: Endoscopy;  Laterality: N/A;  10:45am   GIVENS CAPSULE STUDY N/A 01/13/2020   Procedure: GIVENS CAPSULE STUDY;  Surgeon: Corbin Ade, MD;  [facility];  Service: Endoscopy;  Laterality: N/A;  7:30am   GIVENS CAPSULE STUDY N/A 05/04/2021   Procedure: GIVENS CAPSULE STUDY;  Surgeon: Vida Rigger, MD;  [facility];  Service: Endoscopy;  Laterality: N/A;   MULTIPLE TOOTH EXTRACTIONS     60's   PARATHYROIDECTOMY     POLYPECTOMY  12/08/2019   Procedure: POLYPECTOMY;  Surgeon: Corbin Ade, MD;  [facility];  Service: Endoscopy;;   RADIOFREQUENCY ABLATION  05/18/2021   Procedure: RADIO FREQUENCY ABLATION;  Surgeon: Vida Rigger, MD;  [facility];  Service: Endoscopy;;   TEE WITHOUT CARDIOVERSION N/A 07/06/2021   Procedure: TRANSESOPHAGEAL ECHOCARDIOGRAM (TEE);  Surgeon: Little Ishikawa, MD;  [facility];  Service: Cardiovascular;  Laterality: N/A;   TOTAL HIP ARTHROPLASTY Right 10/29/2013   Procedure: RIGHT TOTAL HIP ARTHROPLASTY ANTERIOR APPROACH;  Surgeon: Shelda Pal, MD;  Location: [facility];  Service: Orthopedics;  Laterality: Right;    Allergies  No Known Allergies  Home Medications    Prior to Admission medications   Medication Sig Start Date End Date Taking? Authorizing Provider  acetaminophen (TYLENOL) 325 MG tablet Take 2 tablets (650 mg total) by mouth every 6 (six) hours as needed for mild pain or headache (or Fever >/= 101). 04/04/19   Shon Hale, MD  allopurinol (ZYLOPRIM) 100 MG tablet Take 1 tablet (100 mg total) by mouth daily. 09/15/22   Gabriel Earing, FNP  amLODipine (NORVASC) 2.5 MG tablet Take 1 tablet (2.5 mg total) by mouth daily. 05/09/22   Gabriel Earing, FNP  apixaban (ELIQUIS) 5 MG TABS tablet Take 1 tablet (5 mg total) by mouth 2 (two) times daily. 09/07/22   Rollene Rotunda, MD  atorvastatin (LIPITOR) 20 MG tablet Take 1 tablet by mouth once daily 10/24/22   Gabriel Earing,  FNP  dapagliflozin propanediol (FARXIGA) 10 MG TABS tablet Take 1 tablet (10 mg total) by mouth daily. 03/23/22   Gabriel Earing, FNP  Ferrous Sulfate (IRON PO) Take by mouth.    [provider]  furosemide (LASIX) 20 MG tablet Take 1 tablet by mouth once daily 10/11/22   Rollene Rotunda, MD  [facility] CRANBERRY EXTRACT PO Take 500 mg by mouth in the morning.    [provider]  levothyroxine (SYNTHROID) 50 MCG tablet TAKE 1 TABLET BY MOUTH ONCE DAILY BEFORE BREAKFAST 08/23/22   Gabriel Earing, FNP  loratadine (CLARITIN) 10 MG tablet Take 10 mg by mouth daily.    [provider]  losartan (COZAAR) 25 MG tablet Take 1 tablet (25 mg total) by mouth daily. 10/20/22   Gabriel Earing, FNP  oxybutynin (DITROPAN-XL) 10 MG 24 hr tablet Take 1 tablet (10 mg total) by mouth at bedtime. 08/01/22   Gabriel Earing, FNP  pantoprazole (PROTONIX) 40 MG tablet Take 1 tablet (40 mg total) by mouth 2 (two) times daily before a meal. 08/01/22   Gabriel Earing, FNP  vitamin B-12 (CYANOCOBALAMIN) 50 MCG tablet Take 50 mcg by mouth daily.    [provider]    Physical Exam  Vital Signs:  [name] [name] does not have vital signs available for review today.  Given telephonic nature of communication, physical exam is limited. AAOx3. NAD. Normal affect.  Speech and respirations are unlabored.  Accessory Clinical Findings    None  Assessment & Plan    1.  Preoperative Cardiovascular Risk Assessment: According to the Revised Cardiac Risk Index (RCRI), her Perioperative Risk of Major Cardiac Event is (%): 0.9. Her Functional Capacity in METs is: 4.95 according to the Duke Activity Status Index (DASI). The patient is doing well from a cardiac perspective. Therefore, based on ACC/AHA guidelines, the patient would be at acceptable risk for the planned procedure without further cardiovascular testing.   The patient was advised that if she develops new symptoms prior to surgery  to contact our office to arrange for a follow-up visit, and she verbalized understanding.  Per office protocol, patient can hold Eliquis for 2 days prior to procedure   A copy of this note will be routed to requesting surgeon.  Time:   Today, I have spent 10 minutes with the patient with telehealth technology discussing medical history, symptoms, and management plan.    Levi Aland, NP-C  11/15/2022, 2:04 PM [address], [address] Office [phone] Fax [phone]

## 2022-11-15 ENCOUNTER — Encounter: Payer: Self-pay | Admitting: Nurse Practitioner

## 2022-11-15 ENCOUNTER — Ambulatory Visit: Payer: PPO | Attending: Interventional Cardiology | Admitting: Nurse Practitioner

## 2022-11-15 DIAGNOSIS — Z01810 Encounter for preprocedural cardiovascular examination: Secondary | ICD-10-CM | POA: Diagnosis not present

## 2022-11-21 ENCOUNTER — Encounter: Payer: Self-pay | Admitting: Family Medicine

## 2022-11-21 ENCOUNTER — Ambulatory Visit (INDEPENDENT_AMBULATORY_CARE_PROVIDER_SITE_OTHER): Payer: PPO | Admitting: Family Medicine

## 2022-11-21 VITALS — BP 114/64 | HR 68 | Temp 98.0°F | Ht 63.0 in | Wt 199.1 lb

## 2022-11-21 DIAGNOSIS — R310 Gross hematuria: Secondary | ICD-10-CM | POA: Diagnosis not present

## 2022-11-21 DIAGNOSIS — R319 Hematuria, unspecified: Secondary | ICD-10-CM | POA: Diagnosis not present

## 2022-11-21 LAB — MICROSCOPIC EXAMINATION
Bacteria, UA: NONE SEEN
Epithelial Cells (non renal): NONE SEEN /hpf (ref 0–10)
RBC, Urine: NONE SEEN /hpf (ref 0–2)
Renal Epithel, UA: NONE SEEN /hpf

## 2022-11-21 LAB — URINALYSIS, ROUTINE W REFLEX MICROSCOPIC
Bilirubin, UA: NEGATIVE
Ketones, UA: NEGATIVE
Nitrite, UA: NEGATIVE
Protein,UA: NEGATIVE
RBC, UA: NEGATIVE
Specific Gravity, UA: 1.01 (ref 1.005–1.030)
Urobilinogen, Ur: 0.2 mg/dL (ref 0.2–1.0)
pH, UA: 5 (ref 5.0–7.5)

## 2022-11-21 NOTE — Progress Notes (Signed)
   Acute Office Visit  Subjective:     Patient ID: [name] [name], female    DOB: Jul 10, 1941, 82 y.o.   MRN: [id number]  Chief Complaint  Patient presents with   Hematuria    HPI Patient is in today for follow up of hematuria. She was evaluated for this 2 weeks ago after acute onset of gross hematuria without other symptoms. She was started on bactrim for possible UTI. This was discontinued when culture came back negative. She reports that hematuria quickly resolved. She denies other urinary symptoms.   ROS As per HPI.       Objective:    BP 114/64   Pulse 68   Temp 98 F (36.7 C) (Temporal)   Ht 5\' 3"  (1.6 m)   Wt 199 lb 2 oz (90.3 kg)   SpO2 98%   BMI 35.27 kg/m    Physical Exam Vitals and nursing note reviewed.  Constitutional:      General: She is not in acute distress.    Appearance: She is not ill-appearing, toxic-appearing or diaphoretic.  Cardiovascular:     Rate and Rhythm: Normal rate and regular rhythm.     Heart sounds: Normal heart sounds. No murmur heard. Pulmonary:     Effort: Pulmonary effort is normal.     Breath sounds: Normal breath sounds.  Abdominal:     General: Bowel sounds are normal.     Palpations: Abdomen is soft.  Musculoskeletal:     Cervical back: Neck supple. No rigidity.     Right lower leg: No edema.     Left lower leg: No edema.  Skin:    General: Skin is warm and dry.  Neurological:     General: No focal deficit present.     Mental Status: She is alert and oriented to person, place, and time.  Psychiatric:        Mood and Affect: Mood normal.        Behavior: Behavior normal.     No results found for any visits on 11/21/22.      Assessment & Plan:   Krysta was seen today for hematuria.  Diagnoses and all orders for this visit:  Gross hematuria Now resolved.  -     Urinalysis, Routine w reflex microscopic -     Microscopic Examination   Return if symptoms worsen or fail to improve.  The patient  indicates understanding of these issues and agrees with the plan.   Gabriel Earing, FNP

## 2022-12-02 ENCOUNTER — Ambulatory Visit (INDEPENDENT_AMBULATORY_CARE_PROVIDER_SITE_OTHER): Payer: PPO | Admitting: Family

## 2022-12-02 ENCOUNTER — Encounter: Payer: Self-pay | Admitting: Family

## 2022-12-02 VITALS — BP 107/63 | HR 60 | Temp 97.6°F | Ht 63.0 in | Wt 200.8 lb

## 2022-12-02 DIAGNOSIS — R531 Weakness: Secondary | ICD-10-CM | POA: Diagnosis not present

## 2022-12-02 DIAGNOSIS — R310 Gross hematuria: Secondary | ICD-10-CM

## 2022-12-02 DIAGNOSIS — B3731 Acute candidiasis of vulva and vagina: Secondary | ICD-10-CM | POA: Diagnosis not present

## 2022-12-02 DIAGNOSIS — N3001 Acute cystitis with hematuria: Secondary | ICD-10-CM | POA: Diagnosis not present

## 2022-12-02 LAB — URINALYSIS, COMPLETE
Bilirubin, UA: NEGATIVE
Nitrite, UA: POSITIVE — AB
Specific Gravity, UA: 1.01 (ref 1.005–1.030)
Urobilinogen, Ur: 1 mg/dL (ref 0.2–1.0)
pH, UA: 5 (ref 5.0–7.5)

## 2022-12-02 LAB — CBC WITH DIFFERENTIAL/PLATELET
Basophils Absolute: 0 10*3/uL (ref 0.0–0.2)
Basos: 1 %
EOS (ABSOLUTE): 0.1 10*3/uL (ref 0.0–0.4)
Eos: 2 %
Hematocrit: 29.6 % — ABNORMAL LOW (ref 34.0–46.6)
Hemoglobin: 9.4 g/dL — ABNORMAL LOW (ref 11.1–15.9)
Immature Grans (Abs): 0 10*3/uL (ref 0.0–0.1)
Immature Granulocytes: 1 %
Lymphocytes Absolute: 0.9 10*3/uL (ref 0.7–3.1)
Lymphs: 23 %
MCH: 31.5 pg (ref 26.6–33.0)
MCHC: 31.8 g/dL (ref 31.5–35.7)
MCV: 99 fL — ABNORMAL HIGH (ref 79–97)
Monocytes Absolute: 0.6 10*3/uL (ref 0.1–0.9)
Monocytes: 15 %
Neutrophils Absolute: 2.3 10*3/uL (ref 1.4–7.0)
Neutrophils: 58 %
Platelets: 206 10*3/uL (ref 150–450)
RBC: 2.98 x10E6/uL — ABNORMAL LOW (ref 3.77–5.28)
RDW: 12.7 % (ref 11.7–15.4)
WBC: 3.9 10*3/uL (ref 3.4–10.8)

## 2022-12-02 LAB — MICROSCOPIC EXAMINATION
Epithelial Cells (non renal): NONE SEEN /hpf (ref 0–10)
RBC, Urine: >30 /hpf — AB (ref 0–2)
Renal Epithel, UA: NONE SEEN /hpf

## 2022-12-02 LAB — HEMOGLOBIN, FINGERSTICK: Hemoglobin: 9.4 g/dL — ABNORMAL LOW (ref 11.1–15.9)

## 2022-12-02 MED ORDER — FLUCONAZOLE 150 MG PO TABS
150.0000 mg | ORAL_TABLET | ORAL | 0 refills | Status: DC | PRN
Start: 2022-12-02 — End: 2023-01-13

## 2022-12-02 MED ORDER — CEPHALEXIN 500 MG PO CAPS
500.0000 mg | ORAL_CAPSULE | Freq: Two times a day (BID) | ORAL | 0 refills | Status: DC
Start: 2022-12-02 — End: 2022-12-06

## 2022-12-02 NOTE — Addendum Note (Signed)
Addended by: Jannifer Rodney A on: 12/02/2022 09:41 AM   Modules accepted: Orders, Level of Service

## 2022-12-02 NOTE — Patient Instructions (Signed)
Urinary Tract Infection, Adult  A urinary tract infection (UTI) is an infection of any part of the urinary tract. The urinary tract includes the kidneys, ureters, bladder, and urethra. These organs make, store, and get rid of urine in the body. An upper UTI affects the ureters and kidneys. A lower UTI affects the bladder and urethra. What are the causes? Most urinary tract infections are caused by bacteria in your genital area around your urethra, where urine leaves your body. These bacteria grow and cause inflammation of your urinary tract. What increases the risk? You are more likely to develop this condition if: You have a urinary catheter that stays in place. You are not able to control when you urinate or have a bowel movement (incontinence). You are female and you: Use a spermicide or diaphragm for birth control. Have low estrogen levels. Are pregnant. You have certain genes that increase your risk. You are sexually active. You take antibiotic medicines. You have a condition that causes your flow of urine to slow down, such as: An enlarged prostate, if you are female. Blockage in your urethra. A kidney stone. A nerve condition that affects your bladder control (neurogenic bladder). Not getting enough to drink, or not urinating often. You have certain medical conditions, such as: Diabetes. A weak disease-fighting system (immunesystem). Sickle cell disease. Gout. Spinal cord injury. What are the signs or symptoms? Symptoms of this condition include: Needing to urinate right away (urgency). Frequent urination. This may include small amounts of urine each time you urinate. Pain or burning with urination. Blood in the urine. Urine that smells bad or unusual. Trouble urinating. Cloudy urine. Vaginal discharge, if you are female. Pain in the abdomen or the lower back. You may also have: Vomiting or a decreased appetite. Confusion. Irritability or tiredness. A fever or  chills. Diarrhea. The first symptom in older adults may be confusion. In some cases, they may not have any symptoms until the infection has worsened. How is this diagnosed? This condition is diagnosed based on your medical history and a physical exam. You may also have other tests, including: Urine tests. Blood tests. Tests for STIs (sexually transmitted infections). If you have had more than one UTI, a cystoscopy or imaging studies may be done to determine the cause of the infections. How is this treated? Treatment for this condition includes: Antibiotic medicine. Over-the-counter medicines to treat discomfort. Drinking enough water to stay hydrated. If you have frequent infections or have other conditions such as a kidney stone, you may need to see a health care provider who specializes in the urinary tract (urologist). In rare cases, urinary tract infections can cause sepsis. Sepsis is a life-threatening condition that occurs when the body responds to an infection. Sepsis is treated in the hospital with IV antibiotics, fluids, and other medicines. Follow these instructions at home:  Medicines Take over-the-counter and prescription medicines only as told by your health care provider. If you were prescribed an antibiotic medicine, take it as told by your health care provider. Do not stop using the antibiotic even if you start to feel better. General instructions Make sure you: Empty your bladder often and completely. Do not hold urine for long periods of time. Empty your bladder after sex. Wipe from front to back after urinating or having a bowel movement if you are female. Use each tissue only one time when you wipe. Drink enough fluid to keep your urine pale yellow. Keep all follow-up visits. This is important. Contact a health   care provider if: Your symptoms do not get better after 1-2 days. Your symptoms go away and then return. Get help right away if: You have severe pain in  your back or your lower abdomen. You have a fever or chills. You have nausea or vomiting. Summary A urinary tract infection (UTI) is an infection of any part of the urinary tract, which includes the kidneys, ureters, bladder, and urethra. Most urinary tract infections are caused by bacteria in your genital area. Treatment for this condition often includes antibiotic medicines. If you were prescribed an antibiotic medicine, take it as told by your health care provider. Do not stop using the antibiotic even if you start to feel better. Keep all follow-up visits. This is important. This information is not intended to replace advice given to you by your health care provider. Make sure you discuss any questions you have with your health care provider. Document Revised: 02/21/2020 Document Reviewed: 02/21/2020 Elsevier Patient Education  2023 Elsevier Inc.  

## 2022-12-02 NOTE — Progress Notes (Addendum)
Subjective:    Patient ID: [name] [name], female    DOB: [birth year], 82 y.o.   MRN: [id number]  Chief Complaint  Patient presents with   Hematuria    Stared yesterday morning.    Fatigue    Started yesterday morning.   PT presents to the office today with hematuria that started yesterday.   She is complaining of weakness and requesting we check her Hgb today.  Hematuria This is a new problem. The current episode started yesterday. The problem is unchanged. Her pain is at a severity of 5/10. The pain is mild. Irritative symptoms include frequency and urgency. Associated symptoms include abdominal pain, bladder pain and flank pain. Pertinent negatives include no chills, dysuria, nausea or vomiting.  Urinary Frequency  This is a new problem. The current episode started yesterday. The problem occurs intermittently. The problem has been unchanged. Associated symptoms include flank pain, frequency, hematuria and urgency. Pertinent negatives include no chills, nausea or vomiting. She has tried increased fluids for the symptoms. The treatment provided mild relief.      Review of Systems  Constitutional:  Negative for chills.  Gastrointestinal:  Positive for abdominal pain. Negative for nausea and vomiting.  Genitourinary:  Positive for flank pain, frequency, hematuria and urgency. Negative for dysuria.  All other systems reviewed and are negative.      Objective:   Physical Exam Vitals reviewed.  Constitutional:      General: She is not in acute distress.    Appearance: She is well-developed. She is obese.  HENT:     Head: Normocephalic and atraumatic.  Eyes:     Pupils: Pupils are equal, round, and reactive to light.  Neck:     Thyroid: No thyromegaly.  Cardiovascular:     Rate and Rhythm: Normal rate and regular rhythm.     Heart sounds: Normal heart sounds. No murmur heard. Pulmonary:     Effort: Pulmonary effort is normal. No respiratory distress.     Breath sounds:  Normal breath sounds. No wheezing.  Abdominal:     General: Bowel sounds are normal. There is no distension.     Palpations: Abdomen is soft.     Tenderness: There is no abdominal tenderness.  Musculoskeletal:        General: No tenderness. Normal range of motion.     Cervical back: Normal range of motion and neck supple.  Skin:    General: Skin is warm and dry.  Neurological:     Mental Status: She is alert and oriented to person, place, and time.     Cranial Nerves: No cranial nerve deficit.     Deep Tendon Reflexes: Reflexes are normal and symmetric.  Psychiatric:        Behavior: Behavior normal.        Thought Content: Thought content normal.        Judgment: Judgment normal.     BP 107/63   Pulse 60   Temp 97.6 F (36.4 C) (Temporal)   Ht 5\' 3"  (1.6 m)   Wt 200 lb 12.8 oz (91.1 kg)   SpO2 100%   BMI 35.57 kg/m        Assessment & Plan:  [name] [name] comes in today with chief complaint of Hematuria (Stared yesterday morning. ) and Fatigue (Started yesterday morning.)   Diagnosis and orders addressed:  1. Gross hematuria - Urinalysis, Complete - Urine Culture  2. Acute cystitis with hematuria Force fluids AZO over the counter X2  days RTO prn Culture pending - cephALEXin (KEFLEX) 500 MG capsule; Take 1 capsule (500 mg total) by mouth 2 (two) times daily.  Dispense: 14 capsule; Refill: 0  3. Vagina, candidiasis -start Diflucan  - fluconazole (DIFLUCAN) 150 MG tablet; Take 1 tablet (150 mg total) by mouth every three (3) days as needed.  Dispense: 3 tablet; Refill: 0  4. Weakness  - Hemoglobin, fingerstick - CBC with Differential/Platelet  Jannifer Rodney, FNP

## 2022-12-06 ENCOUNTER — Other Ambulatory Visit: Payer: Self-pay | Admitting: Family

## 2022-12-06 LAB — URINE CULTURE

## 2022-12-06 MED ORDER — AMOXICILLIN-POT CLAVULANATE 875-125 MG PO TABS: 1.0000 | ORAL_TABLET | Freq: Two times a day (BID) | ORAL | 0 refills | Status: DC

## 2022-12-13 ENCOUNTER — Other Ambulatory Visit: Payer: Self-pay | Admitting: *Deleted

## 2022-12-13 ENCOUNTER — Encounter: Payer: Self-pay | Admitting: Family Medicine

## 2022-12-13 ENCOUNTER — Telehealth: Payer: Self-pay | Admitting: *Deleted

## 2022-12-13 DIAGNOSIS — D500 Iron deficiency anemia secondary to blood loss (chronic): Secondary | ICD-10-CM

## 2022-12-13 NOTE — Telephone Encounter (Signed)
Patient called with complaints of profound fatigue and feels like she did when she required IV iron in the past.  Will bring her in for lab work in the morning and address with Rojelio Brenner, PA-C once resulted with plan.

## 2022-12-14 ENCOUNTER — Telehealth: Payer: Self-pay

## 2022-12-14 ENCOUNTER — Inpatient Hospital Stay: Payer: PPO | Attending: Physician Assistant | Admitting: Physician Assistant

## 2022-12-14 ENCOUNTER — Other Ambulatory Visit: Payer: Self-pay | Admitting: Physician Assistant

## 2022-12-14 DIAGNOSIS — D500 Iron deficiency anemia secondary to blood loss (chronic): Secondary | ICD-10-CM | POA: Diagnosis not present

## 2022-12-14 DIAGNOSIS — Z79899 Other long term (current) drug therapy: Secondary | ICD-10-CM | POA: Diagnosis not present

## 2022-12-14 DIAGNOSIS — R5383 Other fatigue: Secondary | ICD-10-CM

## 2022-12-14 DIAGNOSIS — D649 Anemia, unspecified: Secondary | ICD-10-CM

## 2022-12-14 LAB — TYPE AND SCREEN
Unit division: 0
Unit division: 0

## 2022-12-14 LAB — FERRITIN: Ferritin: 14 ng/mL (ref 11–307)

## 2022-12-14 LAB — CBC WITH DIFFERENTIAL/PLATELET
Abs Immature Granulocytes: 0.02 10*3/uL (ref 0.00–0.07)
Basophils Absolute: 0 10*3/uL (ref 0.0–0.1)
Basophils Relative: 1 %
Eosinophils Absolute: 0.1 10*3/uL (ref 0.0–0.5)
Eosinophils Relative: 3 %
HCT: 26.6 % — ABNORMAL LOW (ref 36.0–46.0)
Hemoglobin: 7.8 g/dL — ABNORMAL LOW (ref 12.0–15.0)
Immature Granulocytes: 1 %
Lymphocytes Relative: 29 %
Lymphs Abs: 1.1 10*3/uL (ref 0.7–4.0)
MCH: 30.2 pg (ref 26.0–34.0)
MCHC: 29.3 g/dL — ABNORMAL LOW (ref 30.0–36.0)
MCV: 103.1 fL — ABNORMAL HIGH (ref 80.0–100.0)
Monocytes Absolute: 0.5 10*3/uL (ref 0.1–1.0)
Monocytes Relative: 12 %
Neutro Abs: 2.1 10*3/uL (ref 1.7–7.7)
Neutrophils Relative %: 54 %
Platelets: 255 10*3/uL (ref 150–400)
RBC: 2.58 MIL/uL — ABNORMAL LOW (ref 3.87–5.11)
RDW: 13.8 % (ref 11.5–15.5)
WBC: 3.8 10*3/uL — ABNORMAL LOW (ref 4.0–10.5)
nRBC: 0 % (ref 0.0–0.2)

## 2022-12-14 LAB — PREPARE RBC (CROSSMATCH)

## 2022-12-14 LAB — IRON AND TIBC
Iron: 17 ug/dL — ABNORMAL LOW (ref 28–170)
Saturation Ratios: 5 % — ABNORMAL LOW (ref 10.4–31.8)
TIBC: 374 ug/dL (ref 250–450)
UIBC: 357 ug/dL

## 2022-12-14 LAB — BPAM RBC: Blood Product Expiration Date: 202406212359

## 2022-12-14 NOTE — Progress Notes (Signed)
Patient called triage nurse yesterday to report profound fatigue.  Labs obtained today showed severe iron deficiency anemia.  Patient to be scheduled for PRBC x 1 unit as well as IV Feraheme x 3.  Carnella Guadalajara, PA-C 12/14/22 12:19 PM

## 2022-12-14 NOTE — Telephone Encounter (Signed)
Patient called and aware of blood transfusion on 12/15/22 at 0930.  Blood bank aware and orders entered.

## 2022-12-15 ENCOUNTER — Inpatient Hospital Stay: Payer: PPO

## 2022-12-15 ENCOUNTER — Encounter: Payer: Self-pay | Admitting: Hematology

## 2022-12-15 DIAGNOSIS — R5383 Other fatigue: Secondary | ICD-10-CM

## 2022-12-15 DIAGNOSIS — D500 Iron deficiency anemia secondary to blood loss (chronic): Secondary | ICD-10-CM

## 2022-12-15 DIAGNOSIS — D649 Anemia, unspecified: Secondary | ICD-10-CM

## 2022-12-15 LAB — TYPE AND SCREEN
ABO/RH(D): O POS
PT AG Type: NEGATIVE

## 2022-12-15 LAB — BPAM RBC: ISSUE DATE / TIME: 202405201621

## 2022-12-15 MED ORDER — SODIUM CHLORIDE 0.9% IV SOLUTION
250.0000 mL | Freq: Once | INTRAVENOUS | Status: AC
  Administered 2022-12-15: 250 mL via INTRAVENOUS

## 2022-12-15 NOTE — Progress Notes (Signed)
Patient presents today for 1 unit of blood per provider's order. Vital signs stable and pt voiced no new complaints at  this time.   Pt took pre-meds Tylenol and Zyrtec at home  prior to arrival. Peripheral IV started with good blood return per post infusion.  1 unit of blood given today per MD orders. Tolerated infusion without adverse affects. Vital signs stable. No complaints at this time. Discharged from clinic via wheelchair in stable condition. Alert and oriented x 3. F/U with [facility] as scheduled.

## 2022-12-15 NOTE — Progress Notes (Signed)
LAB ENCOUNTER ONLY

## 2022-12-15 NOTE — Patient Instructions (Signed)
[facility]  Discharge Instructions: Thank you for choosing [facility] to provide your oncology and hematology care.  If you have a lab appointment with the Cancer Center - please note that after April 8th, 2024, all labs will be drawn in the cancer center.  You do not have to check in or register with the main entrance as you have in the past but will complete your check-in in the cancer center.  Wear comfortable clothing and clothing appropriate for easy access to any Portacath or PICC line.   We strive to give you quality time with your provider. You may need to reschedule your appointment if you arrive late (15 or more minutes).  Arriving late affects you and other patients whose appointments are after yours.  Also, if you miss three or more appointments without notifying the office, you may be dismissed from the clinic at the provider's discretion.      For prescription refill requests, have your pharmacy contact our office and allow 72 hours for refills to be completed.    Today you received 1 unit of blood.     BELOW ARE SYMPTOMS THAT SHOULD BE REPORTED IMMEDIATELY: *FEVER GREATER THAN 100.4 F (38 C) OR HIGHER *CHILLS OR SWEATING *NAUSEA AND VOMITING THAT IS NOT CONTROLLED WITH YOUR NAUSEA MEDICATION *UNUSUAL SHORTNESS OF BREATH *UNUSUAL BRUISING OR BLEEDING *URINARY PROBLEMS (pain or burning when urinating, or frequent urination) *BOWEL PROBLEMS (unusual diarrhea, constipation, pain near the anus) TENDERNESS IN MOUTH AND THROAT WITH OR WITHOUT PRESENCE OF ULCERS (sore throat, sores in mouth, or a toothache) UNUSUAL RASH, SWELLING OR PAIN  UNUSUAL VAGINAL DISCHARGE OR ITCHING   Items with * indicate a potential emergency and should be followed up as soon as possible or go to the Emergency Department if any problems should occur.  Please show the CHEMOTHERAPY ALERT CARD or IMMUNOTHERAPY ALERT CARD at check-in to the Emergency Department and  triage nurse.  Should you have questions after your visit or need to cancel or reschedule your appointment, please contact [facility] [phone]  and follow the prompts.  Office hours are 8:00 a.m. to 4:30 p.m. Monday - Friday. Please note that voicemails left after 4:00 p.m. may not be returned until the following business day.  We are closed weekends and major holidays. You have access to a nurse at all times for urgent questions. Please call the main number to the clinic [phone] and follow the prompts.  For any non-urgent questions, you may also contact your provider using MyChart. We now offer e-Visits for anyone 18 and older to request care online for non-urgent symptoms. For details visit [email or web address].   Also download the MyChart app! Go to the app store, search "MyChart", open the app, select [facility], and log in with your MyChart username and password.   

## 2022-12-16 LAB — TYPE AND SCREEN
Donor AG Type: NEGATIVE
Donor AG Type: NEGATIVE

## 2022-12-16 LAB — BPAM RBC: Unit Type and Rh: 5100

## 2022-12-18 LAB — TYPE AND SCREEN: Antibody Screen: POSITIVE

## 2022-12-18 LAB — BPAM RBC
Blood Product Expiration Date: 202406212359
ISSUE DATE / TIME: 202405230949

## 2022-12-23 ENCOUNTER — Inpatient Hospital Stay: Payer: PPO

## 2022-12-23 VITALS — BP 144/52 | HR 52 | Temp 97.0°F | Resp 18

## 2022-12-23 DIAGNOSIS — D500 Iron deficiency anemia secondary to blood loss (chronic): Secondary | ICD-10-CM

## 2022-12-23 MED ORDER — SODIUM CHLORIDE 0.9 % IV SOLN: Freq: Once | INTRAVENOUS | Status: AC

## 2022-12-23 MED ORDER — SODIUM CHLORIDE 0.9 % IV SOLN
510.0000 mg | Freq: Once | INTRAVENOUS | Status: AC
  Administered 2022-12-23: 510 mg via INTRAVENOUS
  Filled 2022-12-23: qty 510

## 2022-12-23 NOTE — Patient Instructions (Signed)
[facility]  Discharge Instructions: Thank you for choosing [facility] to provide your oncology and hematology care.  If you have a lab appointment with the Cancer Center - please note that after April 8th, 2024, all labs will be drawn in the cancer center.  You do not have to check in or register with the main entrance as you have in the past but will complete your check-in in the cancer center.  Wear comfortable clothing and clothing appropriate for easy access to any Portacath or PICC line.   We strive to give you quality time with your provider. You may need to reschedule your appointment if you arrive late (15 or more minutes).  Arriving late affects you and other patients whose appointments are after yours.  Also, if you miss three or more appointments without notifying the office, you may be dismissed from the clinic at the provider's discretion.      For prescription refill requests, have your pharmacy contact our office and allow 72 hours for refills to be completed.    Today you received the following Feraheme, return as scheduled.   To help prevent nausea and vomiting after your treatment, we encourage you to take your nausea medication as directed.  BELOW ARE SYMPTOMS THAT SHOULD BE REPORTED IMMEDIATELY: *FEVER GREATER THAN 100.4 F (38 C) OR HIGHER *CHILLS OR SWEATING *NAUSEA AND VOMITING THAT IS NOT CONTROLLED WITH YOUR NAUSEA MEDICATION *UNUSUAL SHORTNESS OF BREATH *UNUSUAL BRUISING OR BLEEDING *URINARY PROBLEMS (pain or burning when urinating, or frequent urination) *BOWEL PROBLEMS (unusual diarrhea, constipation, pain near the anus) TENDERNESS IN MOUTH AND THROAT WITH OR WITHOUT PRESENCE OF ULCERS (sore throat, sores in mouth, or a toothache) UNUSUAL RASH, SWELLING OR PAIN  UNUSUAL VAGINAL DISCHARGE OR ITCHING   Items with * indicate a potential emergency and should be followed up as soon as possible or go to the Emergency Department if  any problems should occur.  Please show the CHEMOTHERAPY ALERT CARD or IMMUNOTHERAPY ALERT CARD at check-in to the Emergency Department and triage nurse.  Should you have questions after your visit or need to cancel or reschedule your appointment, please contact [facility] [phone]  and follow the prompts.  Office hours are 8:00 a.m. to 4:30 p.m. Monday - Friday. Please note that voicemails left after 4:00 p.m. may not be returned until the following business day.  We are closed weekends and major holidays. You have access to a nurse at all times for urgent questions. Please call the main number to the clinic [phone] and follow the prompts.  For any non-urgent questions, you may also contact your provider using MyChart. We now offer e-Visits for anyone 18 and older to request care online for non-urgent symptoms. For details visit [email or web address].   Also download the MyChart app! Go to the app store, search "MyChart", open the app, select [facility], and log in with your MyChart username and password.   

## 2022-12-23 NOTE — Progress Notes (Signed)
Patient presents today for feraheme, patient reports taking pre-meds at home. Patient tolerated iron infusion with no complaints voiced. Patient refused to wait 30 minute wait time, patient educated however still wishes to leave. Peripheral IV site clean and dry with good blood return noted before and after infusion. Band aid applied. VSS with discharge and left in satisfactory condition with no s/s of distress noted.

## 2022-12-27 ENCOUNTER — Ambulatory Visit (INDEPENDENT_AMBULATORY_CARE_PROVIDER_SITE_OTHER): Payer: PPO

## 2022-12-27 ENCOUNTER — Other Ambulatory Visit: Payer: Self-pay | Admitting: *Deleted

## 2022-12-27 VITALS — Ht 63.0 in | Wt 198.0 lb

## 2022-12-27 DIAGNOSIS — Z0000 Encounter for general adult medical examination without abnormal findings: Secondary | ICD-10-CM

## 2022-12-27 MED ORDER — OXYBUTYNIN CHLORIDE ER 10 MG PO TB24: 10.0000 mg | ORAL_TABLET | Freq: Every day | ORAL | 1 refills | Status: DC

## 2022-12-27 MED ORDER — PANTOPRAZOLE SODIUM 40 MG PO TBEC: 40.0000 mg | DELAYED_RELEASE_TABLET | Freq: Two times a day (BID) | ORAL | 1 refills | Status: DC

## 2022-12-27 NOTE — Patient Instructions (Signed)
[name] [name] , Thank you for taking time to come for your Medicare Wellness Visit. I appreciate your ongoing commitment to your health goals. Please review the following plan we discussed and let me know if I can assist you in the future.   These are the goals we discussed:  Goals       ATRIAL FIBRILLATION (pt-stated)      Current Barriers:  Unable to independently afford treatment regimen Does not adhere to prescribed medication regimen-DUE TO COST  Pharmacist Clinical Goal(s):  Over the next 90 days, patient will verbalize ability to afford treatment regimen adhere to prescribed medication regimen as evidenced by TAKING MEDICATIONS AS PRESCRIBED through collaboration with PharmD and provider.    Interventions: 1:1 collaboration with Gwenlyn Fudge, FNP regarding development and update of comprehensive plan of care as evidenced by provider attestation and co-signature Inter-disciplinary care team collaboration (see longitudinal plan of care) Comprehensive medication review performed; medication list updated in electronic medical record  Atrial Fibrillation: Controlled; current rate/rhythm control: METOP; anticoagulant treatment: ELIQUIS PATIENT DENIES SIGNS & SYMPTOMS OF BLEEDING--TOLERATING WELL UNABLE TO AFFORD HIGH COPAY OF ELIQUIS CBC    Component Value Date/Time   WBC 4.7 03/14/2022 1051   RBC 2.78 (L) 03/14/2022 1051   HGB 8.0 (L) 03/14/2022 1051   HGB 7.3 (LL) 02/18/2022 1111   HCT 27.6 (L) 03/14/2022 1051   HCT 23.3 (L) 02/18/2022 1111   PLT 265 03/14/2022 1051   PLT 430 02/18/2022 1111   MCV 99.3 03/14/2022 1051   MCV 90 02/18/2022 1111   MCH 28.8 03/14/2022 1051   MCHC 29.0 (L) 03/14/2022 1051   RDW 17.4 (H) 03/14/2022 1051   RDW 13.7 02/18/2022 1111   LYMPHSABS 1.3 03/07/2022 1310   LYMPHSABS 0.8 02/18/2022 1111   MONOABS 0.5 03/07/2022 1310   EOSABS 0.1 03/07/2022 1310   EOSABS 0.1 02/18/2022 1111   BASOSABS 0.0 03/07/2022 1310   BASOSABS 0.0 02/18/2022  1111   CHADS2VASc score: 6 Home blood pressure, heart rate readings: 120-130s/70-80s Educated on ELIQUIS-PURPOSE AND SIDE EFFECTS Assessed patient finances. Will attempt to get eliquis covered under patient assistance via [employer]; patient to provide income documents & out of pocket spend today--samples left up front for patient  CKD -GFR48-CKD stage 3a, uacr 79 last fall-will repeat -currently on farxiga 10mg  daily -will apply for PAP due to cost VIA AZ&ME PATIENT ASSISTANCE PROGRAM  Patient Goals/Self-Care Activities Over the next 90 days, patient will:  - take medications as prescribed  Follow Up Plan: Telephone follow up appointment with care management team member scheduled for: 1 month       DIET - INCREASE WATER INTAKE      Exercise 150 min/wk Moderate Activity      manage atrial fibrillation ([facility]) (pt-stated)      Care Coordination Interventions: 03/11/22 Reviewed importance of adherence to anticoagulant exactly as prescribed Counseled on importance of regular laboratory monitoring as prescribed Afib action plan reviewed Assessed social determinant of health barriers Discussed pharmacy updates for Eliquis and Farxiga.  Monitoring for Eliquis medicine assist from [facility] pharmacy staff. Encouraged patient to outreach also if [employer] does not outreach to her Will outreach to cardiologist as needed       manage iron deficiency anemia [facility]) (pt-stated)      Start date 03/04/22  Care Coordination Interventions: Counseled on bleeding risk associated with iron deficiency anemia and importance of self-monitoring for signs/symptoms of bleeding Counseled on importance of regular laboratory monitoring as directed  by provider Advised to call provider or 911 if active bleeding or signs and symptoms of active bleeding occur Screening for signs and symptoms of depression related to chronic disease state Assessed social determinant of health barriers   Assessed for  worsening symptoms of respiratory, gastric, cardiac and anemia symptoms. Confirms she feels better without any worsening symptoms, more energetic and her hemoglobin is now 9. Commended her on her improvements and home monitoring      Patient Stated      12/23/2021 AWV Goal: Exercise for General Health  Patient will verbalize understanding of the benefits of increased physical activity: Exercising regularly is important. It will improve your overall fitness, flexibility, and endurance. Regular exercise also will improve your overall health. It can help you control your weight, reduce stress, and improve your bone density. Over the next year, patient will increase physical activity as tolerated with a goal of at least 150 minutes of moderate physical activity per week.  You can tell that you are exercising at a moderate intensity if your heart starts beating faster and you start breathing faster but can still hold a conversation. Moderate-intensity exercise ideas include: Walking 1 mile (1.6 km) in about 15 minutes Biking Hiking Golfing Dancing Water aerobics Patient will verbalize understanding of everyday activities that increase physical activity by providing examples like the following: Yard work, such as: Insurance underwriter Gardening Washing windows or floors Patient will be able to explain general safety guidelines for exercising:  Before you start a new exercise program, talk with your health care provider. Do not exercise so much that you hurt yourself, feel dizzy, or get very short of breath. Wear comfortable clothes and wear shoes with good support. Drink plenty of water while you exercise to prevent dehydration or heat stroke. Work out until your breathing and your heartbeat get faster.         This is a list of the screening recommended for you and due dates:  Health Maintenance  Topic Date  Due   DTaP/Tdap/Td vaccine (1 - Tdap) Never done   COVID-19 Vaccine (3 - Moderna risk series) 10/23/2023*   Flu Shot  02/23/2023   Yearly kidney health urinalysis for diabetes  05/10/2023   Yearly kidney function blood test for diabetes  10/20/2023   Medicare Annual Wellness Visit  12/27/2023   DEXA scan (bone density measurement)  10/28/2024   Pneumonia Vaccine  Completed   Zoster (Shingles) Vaccine  Completed   HPV Vaccine  Aged Out  *Topic was postponed. The date shown is not the original due date.    Advanced directives: In Chart  Conditions/risks identified: Aim for 30 minutes of exercise or brisk walking, 6-8 glasses of water, and 5 servings of fruits and vegetables each day.   Next appointment: Follow up in one year for your annual wellness visit    Preventive Care 65 Years and Older, Female Preventive care refers to lifestyle choices and visits with your health care provider that can promote health and wellness. What does preventive care include? A yearly physical exam. This is also called an annual well check. Dental exams once or twice a year. Routine eye exams. Ask your health care provider how often you should have your eyes checked. Personal lifestyle choices, including: Daily care of your teeth and gums. Regular physical activity. Eating a healthy diet. Avoiding tobacco and drug use. Limiting alcohol use. Practicing safe sex. Taking  low-dose aspirin every day. Taking vitamin and mineral supplements as recommended by your health care provider. What happens during an annual well check? The services and screenings done by your health care provider during your annual well check will depend on your age, overall health, lifestyle risk factors, and family history of disease. Counseling  Your health care provider may ask you questions about your: Alcohol use. Tobacco use. Drug use. Emotional well-being. Home and relationship well-being. Sexual activity. Eating  habits. History of falls. Memory and ability to understand (cognition). Work and work Astronomer. Reproductive health. Screening  You may have the following tests or measurements: Height, weight, and BMI. Blood pressure. Lipid and cholesterol levels. These may be checked every 5 years, or more frequently if you are over 38 years old. Skin check. Lung cancer screening. You may have this screening every year starting at age 60 if you have a 30-pack-year history of smoking and currently smoke or have quit within the past 15 years. Fecal occult blood test (FOBT) of the stool. You may have this test every year starting at age 2. Flexible sigmoidoscopy or colonoscopy. You may have a sigmoidoscopy every 5 years or a colonoscopy every 10 years starting at age 57. Hepatitis C blood test. Hepatitis B blood test. Sexually transmitted disease (STD) testing. Diabetes screening. This is done by checking your blood sugar (glucose) after you have not eaten for a while (fasting). You may have this done every 1-3 years. Bone density scan. This is done to screen for osteoporosis. You may have this done starting at age 63. Mammogram. This may be done every 1-2 years. Talk to your health care provider about how often you should have regular mammograms. Talk with your health care provider about your test results, treatment options, and if necessary, the need for more tests. Vaccines  Your health care provider may recommend certain vaccines, such as: Influenza vaccine. This is recommended every year. Tetanus, diphtheria, and acellular pertussis (Tdap, Td) vaccine. You may need a Td booster every 10 years. Zoster vaccine. You may need this after age 73. Pneumococcal 13-valent conjugate (PCV13) vaccine. One dose is recommended after age 26. Pneumococcal polysaccharide (PPSV23) vaccine. One dose is recommended after age 35. Talk to your health care provider about which screenings and vaccines you need and how  often you need them. This information is not intended to replace advice given to you by your health care provider. Make sure you discuss any questions you have with your health care provider. Document Released: 08/07/2015 Document Revised: 03/30/2016 Document Reviewed: 05/12/2015 Elsevier Interactive Patient Education  2017 [employer].  Fall Prevention in the Home Falls can cause injuries. They can happen to people of all ages. There are many things you can do to make your home safe and to help prevent falls. What can I do on the outside of my home? Regularly fix the edges of walkways and driveways and fix any cracks. Remove anything that might make you trip as you walk through a door, such as a raised step or threshold. Trim any bushes or trees on the path to your home. Use bright outdoor lighting. Clear any walking paths of anything that might make someone trip, such as rocks or tools. Regularly check to see if handrails are loose or broken. Make sure that both sides of any steps have handrails. Any raised decks and porches should have guardrails on the edges. Have any leaves, snow, or ice cleared regularly. Use sand or salt on walking paths  during winter. Clean up any spills in your garage right away. This includes oil or grease spills. What can I do in the bathroom? Use night lights. Install grab bars by the toilet and in the tub and shower. Do not use towel bars as grab bars. Use non-skid mats or decals in the tub or shower. If you need to sit down in the shower, use a plastic, non-slip stool. Keep the floor dry. Clean up any water that spills on the floor as soon as it happens. Remove soap buildup in the tub or shower regularly. Attach bath mats securely with double-sided non-slip rug tape. Do not have throw rugs and other things on the floor that can make you trip. What can I do in the bedroom? Use night lights. Make sure that you have a light by your bed that is easy to  reach. Do not use any sheets or blankets that are too big for your bed. They should not hang down onto the floor. Have a firm chair that has side arms. You can use this for support while you get dressed. Do not have throw rugs and other things on the floor that can make you trip. What can I do in the kitchen? Clean up any spills right away. Avoid walking on wet floors. Keep items that you use a lot in easy-to-reach places. If you need to reach something above you, use a strong step stool that has a grab bar. Keep electrical cords out of the way. Do not use floor polish or wax that makes floors slippery. If you must use wax, use non-skid floor wax. Do not have throw rugs and other things on the floor that can make you trip. What can I do with my stairs? Do not leave any items on the stairs. Make sure that there are handrails on both sides of the stairs and use them. Fix handrails that are broken or loose. Make sure that handrails are as long as the stairways. Check any carpeting to make sure that it is firmly attached to the stairs. Fix any carpet that is loose or worn. Avoid having throw rugs at the top or bottom of the stairs. If you do have throw rugs, attach them to the floor with carpet tape. Make sure that you have a light switch at the top of the stairs and the bottom of the stairs. If you do not have them, ask someone to add them for you. What else can I do to help prevent falls? Wear shoes that: Do not have high heels. Have rubber bottoms. Are comfortable and fit you well. Are closed at the toe. Do not wear sandals. If you use a stepladder: Make sure that it is fully opened. Do not climb a closed stepladder. Make sure that both sides of the stepladder are locked into place. Ask someone to hold it for you, if possible. Clearly mark and make sure that you can see: Any grab bars or handrails. First and last steps. Where the edge of each step is. Use tools that help you move  around (mobility aids) if they are needed. These include: Canes. Walkers. Scooters. Crutches. Turn on the lights when you go into a dark area. Replace any light bulbs as soon as they burn out. Set up your furniture so you have a clear path. Avoid moving your furniture around. If any of your floors are uneven, fix them. If there are any pets around you, be aware of where they are. Review  your medicines with your doctor. Some medicines can make you feel dizzy. This can increase your chance of falling. Ask your doctor what other things that you can do to help prevent falls. This information is not intended to replace advice given to you by your health care provider. Make sure you discuss any questions you have with your health care provider. Document Released: 05/07/2009 Document Revised: 12/17/2015 Document Reviewed: 08/15/2014 Elsevier Interactive Patient Education  2017 [employer].

## 2022-12-27 NOTE — Progress Notes (Signed)
Subjective:   [name] [name] is a 82 y.o. female who presents for Medicare Annual (Subsequent) preventive examination. I connected with  Dola Factor on 12/27/22 by a audio enabled telemedicine application and verified that I am speaking with the correct person using two identifiers.  Patient Location: Home  Provider Location: Home Office  I discussed the limitations of evaluation and management by telemedicine. The patient expressed understanding and agreed to proceed.  Review of Systems     Cardiac Risk Factors include: advanced age (>79men, >77 women);dyslipidemia;hypertension     Objective:    Today's Vitals   12/27/22 1033  Weight: 198 lb (89.8 kg)  Height: 5\' 3"  (1.6 m)   Body mass index is 35.07 kg/m.     12/27/2022   10:36 AM 12/23/2022    8:13 AM 10/10/2022    3:32 PM 10/03/2022    2:46 PM 09/27/2022    9:59 AM 07/12/2022    2:52 PM 06/28/2022    9:25 AM  Advanced Directives  Does Patient Have a Medical Advance Directive? Yes Yes Yes Yes Yes Yes Yes  Type of Estate agent of [place];Living will Living will;Healthcare Power of Attorney Living will;Healthcare Power of [employer] Power of [place];Living will Healthcare Power of [place];Living will Living will;Healthcare Power of [employer] Power of [place];Living will  Does patient want to make changes to medical advance directive? No - Patient declined No - Patient declined No - Patient declined No - Patient declined No - Patient declined No - Patient declined No - Patient declined  Copy of Healthcare Power of Attorney in Chart? Yes - validated most recent copy scanned in chart (See row information) No - copy requested  No - copy requested No - copy requested  No - copy requested  Would patient like information on creating a medical advance directive? No - Patient declined No - Patient declined No - Patient declined No - Patient declined No - Patient declined No - Patient  declined No - Patient declined    Current Medications (verified) Outpatient Encounter Medications as of 12/27/2022  Medication Sig   acetaminophen (TYLENOL) 325 MG tablet Take 2 tablets (650 mg total) by mouth every 6 (six) hours as needed for mild pain or headache (or Fever >/= 101).   allopurinol (ZYLOPRIM) 100 MG tablet Take 1 tablet (100 mg total) by mouth daily.   amLODipine (NORVASC) 2.5 MG tablet Take 1 tablet (2.5 mg total) by mouth daily.   apixaban (ELIQUIS) 5 MG TABS tablet Take 1 tablet (5 mg total) by mouth 2 (two) times daily.   atorvastatin (LIPITOR) 20 MG tablet Take 1 tablet by mouth once daily   dapagliflozin propanediol (FARXIGA) 10 MG TABS tablet Take 1 tablet (10 mg total) by mouth daily.   Ferrous Sulfate (IRON PO) Take by mouth.   fluconazole (DIFLUCAN) 150 MG tablet Take 1 tablet (150 mg total) by mouth every three (3) days as needed.   furosemide (LASIX) 20 MG tablet Take 1 tablet by mouth once daily   GNP CRANBERRY EXTRACT PO Take 500 mg by mouth in the morning.   levothyroxine (SYNTHROID) 50 MCG tablet TAKE 1 TABLET BY MOUTH ONCE DAILY BEFORE BREAKFAST   loratadine (CLARITIN) 10 MG tablet Take 10 mg by mouth daily.   losartan (COZAAR) 25 MG tablet Take 1 tablet (25 mg total) by mouth daily.   oxybutynin (DITROPAN-XL) 10 MG 24 hr tablet Take 1 tablet (10 mg total) by mouth at bedtime.  pantoprazole (PROTONIX) 40 MG tablet Take 1 tablet (40 mg total) by mouth 2 (two) times daily before a meal.   vitamin B-12 (CYANOCOBALAMIN) 50 MCG tablet Take 50 mcg by mouth daily.   No facility-administered encounter medications on file as of 12/27/2022.    Allergies (verified) Patient has no known allergies.   History: Past Medical History:  Diagnosis Date   Anemia    Arthritis    Ostearthritis- hips, knees, fingers   Basal cell carcinoma (BCC) of right hand 10/2021   Dx by [facility]   DVT (deep venous thrombosis) (HCC)    Dyspnea    GERD (gastroesophageal reflux  disease)    Headache(784.0)    tx. Valproic acid   History of diabetes mellitus    History of kidney stones    Hypertension    Hypothyroidism    Pulmonary embolism [facility])    Past Surgical History:  Procedure Laterality Date   ABDOMINAL HYSTERECTOMY     BILATERAL HIP ARTHROSCOPY Left    BIOPSY  12/08/2019   Procedure: BIOPSY;  Surgeon: Corbin Ade, MD;  [facility];  Service: Endoscopy;;   CATARACT EXTRACTION, BILATERAL     COLONOSCOPY N/A 12/08/2019   polyps (tubular adenoma), diverticulosis, colonic lipoma, no surveillance due to age   CYSTOSCOPY W/ URETERAL STENT PLACEMENT Right 12/25/2020   Procedure: CYSTOSCOPY WITH RETROGRADE PYELOGRAM/URETERAL STENT PLACEMENT;  Surgeon: Jannifer Hick, MD;  Location: [facility];  Service: Urology;  Laterality: Right;   CYSTOSCOPY/URETEROSCOPY/HOLMIUM LASER/STENT PLACEMENT Right 01/15/2021   Procedure: CYSTOSCOPY/RETROGRADE/URETEROSCOPY/HOLMIUM LASER/STENT EXCHANGE;  Surgeon: Jannifer Hick, MD;  [facility]: [facility];  Service: Urology;  Laterality: Right;   ESOPHAGOGASTRODUODENOSCOPY N/A 12/08/2019   normal esophagus with 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   ESOPHAGOGASTRODUODENOSCOPY (EGD) WITH PROPOFOL N/A 05/18/2021   Surgeon: Vida Rigger, MD;   Tiny hiatal hernia, gastric antral vascular ectasia s/p ablation, normal duodenum, normal jejunum.   ESOPHAGOGASTRODUODENOSCOPY (EGD) WITH PROPOFOL N/A 03/10/2022   Procedure: ESOPHAGOGASTRODUODENOSCOPY (EGD) WITH PROPOFOL;  Surgeon: Lanelle Bal, DO;  [facility];  Service: Endoscopy;  Laterality: N/A;  10:45am   GIVENS CAPSULE STUDY N/A 01/13/2020   Procedure: GIVENS CAPSULE STUDY;  Surgeon: Corbin Ade, MD;  [facility];  Service: Endoscopy;  Laterality: N/A;  7:30am   GIVENS CAPSULE STUDY N/A 05/04/2021   Procedure: GIVENS CAPSULE STUDY;  Surgeon: Vida Rigger, MD;  [facility];  Service: Endoscopy;  Laterality:  N/A;   MULTIPLE TOOTH EXTRACTIONS     60's   PARATHYROIDECTOMY     POLYPECTOMY  12/08/2019   Procedure: POLYPECTOMY;  Surgeon: Corbin Ade, MD;  [facility];  Service: Endoscopy;;   RADIOFREQUENCY ABLATION  05/18/2021   Procedure: RADIO FREQUENCY ABLATION;  Surgeon: Vida Rigger, MD;  [facility];  Service: Endoscopy;;   TEE WITHOUT CARDIOVERSION N/A 07/06/2021   Procedure: TRANSESOPHAGEAL ECHOCARDIOGRAM (TEE);  Surgeon: Little Ishikawa, MD;  Location: [facility] ENDOSCOPY;  Service: Cardiovascular;  Laterality: N/A;   TOTAL HIP ARTHROPLASTY Right 10/29/2013   Procedure: RIGHT TOTAL HIP ARTHROPLASTY ANTERIOR APPROACH;  Surgeon: Shelda Pal, MD;  [facility];  Service: Orthopedics;  Laterality: Right;   Family History  Problem Relation Age of Onset   Colitis Sister 27       alive   Cancer Sister 73       colon   Cancer Mother 6       uterine   Heart disease  Father 34       heart failure   Heart attack Brother 91   Healthy Daughter    Healthy Son    Pulmonary embolism Brother 5   Heart disease Brother    Healthy Son    Healthy Son    Social History   Socioeconomic History   Marital status: Widowed    Spouse name: Not on file   Number of children: 4   Years of education: 50   Highest education level: 12th grade  Occupational History   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 She has four adult children and one son lives with her.       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   Social Determinants of Health   Financial Resource Strain: Low Risk  (12/27/2022)   Overall  Financial Resource Strain (CARDIA)    Difficulty of Paying Living Expenses: Not hard at all  Food Insecurity: No Food Insecurity (12/27/2022)   Hunger Vital Sign    Worried About Running Out of Food in the Last Year: Never true    Ran Out of Food in the Last Year: Never true  Transportation Needs: No Transportation Needs (12/27/2022)   PRAPARE - Administrator, Civil Service (Medical): No    Lack of Transportation (Non-Medical): No  Physical Activity: Insufficiently Active (12/27/2022)   Exercise Vital Sign    Days of Exercise per Week: 3 days    Minutes of Exercise per Session: 30 min  Stress: No Stress Concern Present (12/27/2022)   [employer] of Occupational Health - Occupational Stress Questionnaire    Feeling of Stress : Not at all  Social Connections: Moderately Isolated (12/27/2022)   Social Connection and Isolation Panel [NHANES]    Frequency of Communication with Friends and Family: More than three times a week    Frequency of Social Gatherings with Friends and Family: More than three times a week    Attends Religious Services: More than 4 times per year    Active Member of [employer] or Organizations: No    Attends Banker Meetings: Never    Marital Status: Widowed    Tobacco Counseling Counseling given: Not Answered   Clinical Intake:  Pre-visit preparation completed: Yes  Pain : No/denies pain     Nutritional Risks: None Diabetes: No  How often do you need to have someone help you when you read instructions, pamphlets, or other written materials from your doctor or pharmacy?: 1 - Never  Diabetic?no   Interpreter Needed?: No  Information entered by :: Renie Ora, LPN   Activities of Daily Living    12/27/2022   10:36 AM 12/26/2022   10:58 AM  In your present state of health, do you have any difficulty performing the following activities:  Hearing? 0 0  Vision? 0 0  Difficulty concentrating or making decisions? 0 0  Walking or  climbing stairs? 0 1  Dressing or bathing? 0 0  Doing errands, shopping? 0 1  Preparing Food and  eating ? N N  Using the Toilet? N N  In the past six months, have you accidently leaked urine? N Y  Do you have problems with loss of bowel control? N N  Managing your Medications? N N  Managing your Finances? N N  Housekeeping or managing your Housekeeping? N N    Patient Care Team: Gabriel Earing, FNP as PCP - General (Family Medicine) Rollene Rotunda, MD as PCP - Cardiology (Cardiology) Gelene Mink, NP (Gastroenterology) Rollene Rotunda, MD as Consulting Physician (Cardiology) Delora Fuel, OD (Optometry) Cresenciano Genre Lilla Shook, [facility] as Pharmacist (Family Medicine) Jodelle Gross, NP as Nurse Practitioner (Cardiology) [facility], Konrad Dolores, MD as Attending Physician (Endocrinology) Vida Rigger, MD as Consulting Physician (Gastroenterology) Jannifer Hick, MD as Consulting Physician (Urology) Clinton Gallant, RN as [facility] Care Management Doreatha Massed, MD as Consulting Physician (Hematology)  Indicate any recent Medical Services you may have received from other than [facility] providers in the past year (date may be approximate).     Assessment:   This is a routine wellness examination for [facility].  Hearing/Vision screen Vision Screening - Comments:: Wears rx glasses - up to date with routine eye exams with  Dr.Johnson   Dietary issues and exercise activities discussed: Current Exercise Habits: Home exercise routine, Type of exercise: walking, Time (Minutes): 30, Frequency (Times/Week): 3, Weekly Exercise (Minutes/Week): 90, Intensity: Mild, Exercise limited by: orthopedic condition(s)   Goals Addressed             This Visit's Progress    DIET - INCREASE WATER INTAKE         Depression Screen    12/27/2022   10:35 AM 12/02/2022    9:10 AM 11/21/2022    4:06 PM 11/04/2022    2:52 PM 10/20/2022    9:26 AM 10/07/2022    9:22 AM 05/09/2022    10:22 AM  PHQ 2/9 Scores  PHQ - 2 Score 0 0 0 0 0 0 0  PHQ- 9 Score 0 0 0  0 0 0    Fall Risk    12/27/2022   10:34 AM 12/26/2022   10:58 AM 12/02/2022    9:10 AM 11/21/2022    4:06 PM 11/04/2022    2:52 PM  Fall Risk   Falls in the past year? 0 0 0 0 0  Number falls in past yr: 0      Injury with Fall? 0 0     Risk for fall due to : No Fall Risks  No Fall Risks    Follow up Falls prevention discussed        FALL RISK PREVENTION PERTAINING TO THE HOME:  Any stairs in or around the home? No  If so, are there any without handrails? No  Home free of loose throw rugs in walkways, pet beds, electrical cords, etc? Yes  Adequate lighting in your home to reduce risk of falls? Yes   ASSISTIVE DEVICES UTILIZED TO PREVENT FALLS:  Life alert? No  Use of a cane, walker or w/c? Yes  Grab bars in the bathroom? Yes  Shower chair or bench in shower? Yes  Elevated toilet seat or a handicapped toilet? Yes       08/01/2017   10:52 AM  MMSE - Mini Mental State Exam  Orientation to time 5  Orientation to Place 5  Registration 3  Attention/ Calculation 5  Recall 2  Language- name 2 objects 2  Language- repeat 1  Language-  follow 3 step command 3  Language- read & follow direction 1  Write a sentence 1  Copy design 1  Total score 29        12/27/2022   10:36 AM 12/23/2021   11:26 AM 11/21/2019   10:44 AM  6CIT Screen  What Year? 0 points 0 points 0 points  What month? 0 points 3 points 0 points  What time? 0 points 0 points 0 points  Count back from 20 0 points 0 points 0 points  Months in reverse 0 points 2 points 0 points  Repeat phrase 0 points 2 points 0 points  Total Score 0 points 7 points 0 points    Immunizations Immunization History  Administered Date(s) Administered   Fluad Quad(high Dose 65+) 04/04/2019, 06/04/2020, 05/10/2021, 05/09/2022   Influenza, High Dose Seasonal PF 05/16/2016, 08/01/2017, 08/28/2018   Moderna SARS-COV2 Booster Vaccination 07/02/2020   Moderna  Sars-Covid-2 Vaccination 09/18/2019, 10/16/2019   PNEUMOCOCCAL CONJUGATE-20 07/30/2021   Pneumococcal Conjugate-13 07/02/2020   Zoster Recombinat (Shingrix) 12/03/2019, 10/20/2020    TDAP status: Due, Education has been provided regarding the importance of this vaccine. Advised may receive this vaccine at local pharmacy or Health Dept. Aware to provide a copy of the vaccination record if obtained from local pharmacy or Health Dept. Verbalized acceptance and understanding.  Flu Vaccine status: Up to date  Pneumococcal vaccine status: Up to date  Covid-19 vaccine status: Completed vaccines  Qualifies for Shingles Vaccine? Yes   Zostavax completed Yes   Shingrix Completed?: Yes  Screening Tests Health Maintenance  Topic Date Due   DTaP/Tdap/Td (1 - Tdap) Never done   COVID-19 Vaccine (3 - Moderna risk series) 10/23/2023 (Originally 07/30/2020)   INFLUENZA VACCINE  02/23/2023   Diabetic kidney evaluation - Urine ACR  05/10/2023   Diabetic kidney evaluation - eGFR measurement  10/20/2023   Medicare Annual Wellness (AWV)  12/27/2023   DEXA SCAN  10/28/2024   Pneumonia Vaccine 14+ Years old  Completed   Zoster Vaccines- Shingrix  Completed   HPV VACCINES  Aged Out    Health Maintenance  Health Maintenance Due  Topic Date Due   DTaP/Tdap/Td (1 - Tdap) Never done    Colorectal cancer screening: No longer required.   Mammogram status: No longer required due to age .  Bone Density status: Completed 10/28/2021. Results reflect: Bone density results: OSTEOPOROSIS. Repeat every 3 years.  Lung Cancer Screening: (Low Dose CT Chest recommended if Age 40-80 years, 30 pack-year currently smoking OR have quit w/in 15years.) does not qualify.   Lung Cancer Screening Referral: n/a  Additional Screening:  Hepatitis C Screening: does not qualify;   Vision Screening: Recommended annual ophthalmology exams for early detection of glaucoma and other disorders of the eye. Is the patient up to  date with their annual eye exam?  Yes  Who is the provider or what is the name of the office in which the patient attends annual eye exams? Dr.Johnson  If pt is not established with a provider, would they like to be referred to a provider to establish care? No .   Dental Screening: Recommended annual dental exams for proper oral hygiene  Community Resource Referral / Chronic Care Management: CRR required this visit?  No   CCM required this visit?  No      Plan:     I have personally reviewed and noted the following in the patient's chart:   Medical and social history Use of alcohol, tobacco or illicit drugs  Current medications and supplements including opioid prescriptions. Patient is not currently taking opioid prescriptions. Functional ability and status Nutritional status Physical activity Advanced directives List of other physicians Hospitalizations, surgeries, and ER visits in previous 12 months Vitals Screenings to include cognitive, depression, and falls Referrals and appointments  In addition, I have reviewed and discussed with patient certain preventive protocols, quality metrics, and best practice recommendations. A written personalized care plan for preventive services as well as general preventive health recommendations were provided to patient.     Lorrene Reid, LPN   4/0/9811   Nurse Notes: Due TDAP Vaccine

## 2022-12-29 MED FILL — Ferumoxytol Inj 510 MG/17ML (30 MG/ML) (Elemental Fe): INTRAVENOUS | Qty: 17 | Status: AC

## 2022-12-30 ENCOUNTER — Inpatient Hospital Stay: Payer: PPO | Attending: Physician Assistant

## 2022-12-30 ENCOUNTER — Other Ambulatory Visit: Payer: Self-pay | Admitting: *Deleted

## 2022-12-30 VITALS — BP 123/59 | HR 58 | Temp 97.9°F | Resp 18

## 2022-12-30 DIAGNOSIS — Z79899 Other long term (current) drug therapy: Secondary | ICD-10-CM | POA: Insufficient documentation

## 2022-12-30 DIAGNOSIS — D500 Iron deficiency anemia secondary to blood loss (chronic): Secondary | ICD-10-CM | POA: Diagnosis not present

## 2022-12-30 DIAGNOSIS — M1A9XX0 Chronic gout, unspecified, without tophus (tophi): Secondary | ICD-10-CM

## 2022-12-30 MED ORDER — ALLOPURINOL 100 MG PO TABS
100.0000 mg | ORAL_TABLET | Freq: Every day | ORAL | 0 refills | Status: DC
Start: 2022-12-30 — End: 2023-03-08

## 2022-12-30 MED ORDER — SODIUM CHLORIDE 0.9 % IV SOLN
510.0000 mg | Freq: Once | INTRAVENOUS | Status: AC
  Administered 2022-12-30: 510 mg via INTRAVENOUS
  Filled 2022-12-30: qty 510

## 2022-12-30 MED ORDER — SODIUM CHLORIDE 0.9 % IV SOLN: Freq: Once | INTRAVENOUS | Status: AC

## 2022-12-30 NOTE — Progress Notes (Signed)
Patient presents today for iron infusion.  Patient is in satisfactory condition with no new complaints voiced.  Vital signs are stable.  IV placed in left AC. IV flushed well with good blood return noted.  Zyrtec and Tylenol were taken at home prior to visit, so we will hold pre-medications here.  We will proceed with infusion per provider orders.    Patient tolerated infusion well with no complaints voiced.  Patient left via wheelchair with daughter in stable condition.  Vital signs stable at discharge.  Follow up as scheduled.

## 2022-12-30 NOTE — Patient Instructions (Signed)
[facility]  Discharge Instructions: Thank you for choosing [facility] to provide your oncology and hematology care.  If you have a lab appointment with the Cancer Center - please note that after April 8th, 2024, all labs will be drawn in the cancer center.  You do not have to check in or register with the main entrance as you have in the past but will complete your check-in in the cancer center.  Wear comfortable clothing and clothing appropriate for easy access to any Portacath or PICC line.   We strive to give you quality time with your provider. You may need to reschedule your appointment if you arrive late (15 or more minutes).  Arriving late affects you and other patients whose appointments are after yours.  Also, if you miss three or more appointments without notifying the office, you may be dismissed from the clinic at the provider's discretion.      For prescription refill requests, have your pharmacy contact our office and allow 72 hours for refills to be completed.    Ferumoxytol Injection What is this medication? FERUMOXYTOL (FER ue MOX i tol) treats low levels of iron in your body (iron deficiency anemia). Iron is a mineral that plays an important role in making red blood cells, which carry oxygen from your lungs to the rest of your body. This medicine may be used for other purposes; ask your health care provider or pharmacist if you have questions. COMMON BRAND NAME(S): Feraheme What should I tell my care team before I take this medication? They need to know if you have any of these conditions: Anemia not caused by low iron levels High levels of iron in the blood Magnetic resonance imaging (MRI) test scheduled An unusual or allergic reaction to iron, other medications, foods, dyes, or preservatives Pregnant or trying to get pregnant Breastfeeding How should I use this medication? This medication is injected into a vein. It is given by your  care team in a hospital or clinic setting. Talk to your care team the use of this medication in children. Special care may be needed. Overdosage: If you think you have taken too much of this medicine contact a poison control center or emergency room at once. NOTE: This medicine is only for you. Do not share this medicine with others. What if I miss a dose? It is important not to miss your dose. Call your care team if you are unable to keep an appointment. What may interact with this medication? Other iron products This list may not describe all possible interactions. Give your health care provider a list of all the medicines, herbs, non-prescription drugs, or dietary supplements you use. Also tell them if you smoke, drink alcohol, or use illegal drugs. Some items may interact with your medicine. What should I watch for while using this medication? Visit your care team regularly. Tell your care team if your symptoms do not start to get better or if they get worse. You may need blood work done while you are taking this medication. You may need to follow a special diet. Talk to your care team. Foods that contain iron include: whole grains/cereals, dried fruits, beans, or peas, leafy green vegetables, and organ meats (liver, kidney). What side effects may I notice from receiving this medication? Side effects that you should report to your care team as soon as possible: Allergic reactions--skin rash, itching, hives, swelling of the face, lips, tongue, or throat Low blood pressure--dizziness,   feeling faint or lightheaded, blurry vision Shortness of breath Side effects that usually do not require medical attention (report to your care team if they continue or are bothersome): Flushing Headache Joint pain Muscle pain Nausea Pain, redness, or irritation at injection site This list may not describe all possible side effects. Call your doctor for medical advice about side effects. You may report side  effects to FDA at 1-800-FDA-1088. Where should I keep my medication? This medication is given in a hospital or clinic and will not be stored at home. NOTE: This sheet is a summary. It may not cover all possible information. If you have questions about this medicine, talk to your doctor, pharmacist, or health care provider.  2024 Elsevier/Gold Standard (2022-01-17 00:00:00)    To help prevent nausea and vomiting after your treatment, we encourage you to take your nausea medication as directed.  BELOW ARE SYMPTOMS THAT SHOULD BE REPORTED IMMEDIATELY: *FEVER GREATER THAN 100.4 F (38 C) OR HIGHER *CHILLS OR SWEATING *NAUSEA AND VOMITING THAT IS NOT CONTROLLED WITH YOUR NAUSEA MEDICATION *UNUSUAL SHORTNESS OF BREATH *UNUSUAL BRUISING OR BLEEDING *URINARY PROBLEMS (pain or burning when urinating, or frequent urination) *BOWEL PROBLEMS (unusual diarrhea, constipation, pain near the anus) TENDERNESS IN MOUTH AND THROAT WITH OR WITHOUT PRESENCE OF ULCERS (sore throat, sores in mouth, or a toothache) UNUSUAL RASH, SWELLING OR PAIN  UNUSUAL VAGINAL DISCHARGE OR ITCHING   Items with * indicate a potential emergency and should be followed up as soon as possible or go to the Emergency Department if any problems should occur.  Please show the CHEMOTHERAPY ALERT CARD or IMMUNOTHERAPY ALERT CARD at check-in to the Emergency Department and triage nurse.  Should you have questions after your visit or need to cancel or reschedule your appointment, please contact [facility] [phone]  and follow the prompts.  Office hours are 8:00 a.m. to 4:30 p.m. Monday - Friday. Please note that voicemails left after 4:00 p.m. may not be returned until the following business day.  We are closed weekends and major holidays. You have access to a nurse at all times for urgent questions. Please call the main number to the clinic [phone] and follow the prompts.  For any non-urgent questions, you  may also contact your provider using MyChart. We now offer e-Visits for anyone 18 and older to request care online for non-urgent symptoms. For details visit [email or web address].   Also download the MyChart app! Go to the app store, search "MyChart", open the app, select , and log in with your MyChart username and password.   

## 2023-01-06 ENCOUNTER — Inpatient Hospital Stay: Payer: PPO

## 2023-01-06 VITALS — BP 130/48 | HR 45 | Temp 97.6°F | Resp 16

## 2023-01-06 DIAGNOSIS — D500 Iron deficiency anemia secondary to blood loss (chronic): Secondary | ICD-10-CM

## 2023-01-06 MED ORDER — SODIUM CHLORIDE 0.9 % IV SOLN
510.0000 mg | Freq: Once | INTRAVENOUS | Status: AC
  Administered 2023-01-06: 510 mg via INTRAVENOUS
  Filled 2023-01-06: qty 510

## 2023-01-06 MED ORDER — SODIUM CHLORIDE 0.9 % IV SOLN: Freq: Once | INTRAVENOUS | Status: AC

## 2023-01-06 MED ORDER — SODIUM CHLORIDE 0.9% FLUSH: 10.0000 mL | Freq: Once | INTRAVENOUS | Status: DC | PRN

## 2023-01-06 NOTE — Patient Instructions (Signed)
[facility] [place]  Discharge Instructions: Thank you for choosing [facility] to provide your oncology and hematology care.  If you have a lab appointment with the Cancer Center - please note that after April 8th, 2024, all labs will be drawn in the cancer center.  You do not have to check in or register with the main entrance as you have in the past but will complete your check-in in the cancer center.  Wear comfortable clothing and clothing appropriate for easy access to any Portacath or PICC line.   We strive to give you quality time with your provider. You may need to reschedule your appointment if you arrive late (15 or more minutes).  Arriving late affects you and other patients whose appointments are after yours.  Also, if you miss three or more appointments without notifying the office, you may be dismissed from the clinic at the provider's discretion.      For prescription refill requests, have your pharmacy contact our office and allow 72 hours for refills to be completed.    Today you received the following Feraheme.  Ferumoxytol Injection What is this medication? FERUMOXYTOL (FER ue MOX i tol) treats low levels of iron in your body (iron deficiency anemia). Iron is a mineral that plays an important role in making red blood cells, which carry oxygen from your lungs to the rest of your body. This medicine may be used for other purposes; ask your health care provider or pharmacist if you have questions. COMMON BRAND NAME(S): Feraheme What should I tell my care team before I take this medication? They need to know if you have any of these conditions: Anemia not caused by low iron levels High levels of iron in the blood Magnetic resonance imaging (MRI) test scheduled An unusual or allergic reaction to iron, other medications, foods, dyes, or preservatives Pregnant or trying to get pregnant Breastfeeding How should I use this medication? This medication  is injected into a vein. It is given by your care team in a hospital or clinic setting. Talk to your care team the use of this medication in children. Special care may be needed. Overdosage: If you think you have taken too much of this medicine contact a poison control center or emergency room at once. NOTE: This medicine is only for you. Do not share this medicine with others. What if I miss a dose? It is important not to miss your dose. Call your care team if you are unable to keep an appointment. What may interact with this medication? Other iron products This list may not describe all possible interactions. Give your health care provider a list of all the medicines, herbs, non-prescription drugs, or dietary supplements you use. Also tell them if you smoke, drink alcohol, or use illegal drugs. Some items may interact with your medicine. What should I watch for while using this medication? Visit your care team regularly. Tell your care team if your symptoms do not start to get better or if they get worse. You may need blood work done while you are taking this medication. You may need to follow a special diet. Talk to your care team. Foods that contain iron include: whole grains/cereals, dried fruits, beans, or peas, leafy green vegetables, and organ meats (liver, kidney). What side effects may I notice from receiving this medication? Side effects that you should report to your care team as soon as possible: Allergic reactions--skin rash, itching, hives, swelling of the face,   lips, tongue, or throat Low blood pressure--dizziness, feeling faint or lightheaded, blurry vision Shortness of breath Side effects that usually do not require medical attention (report to your care team if they continue or are bothersome): Flushing Headache Joint pain Muscle pain Nausea Pain, redness, or irritation at injection site This list may not describe all possible side effects. Call your doctor for medical  advice about side effects. You may report side effects to FDA at 1-800-FDA-1088. Where should I keep my medication? This medication is given in a hospital or clinic and will not be stored at home. NOTE: This sheet is a summary. It may not cover all possible information. If you have questions about this medicine, talk to your doctor, pharmacist, or health care provider.  2024 Elsevier/Gold Standard (2022-01-17 00:00:00)    To help prevent nausea and vomiting after your treatment, we encourage you to take your nausea medication as directed.  BELOW ARE SYMPTOMS THAT SHOULD BE REPORTED IMMEDIATELY: *FEVER GREATER THAN 100.4 F (38 C) OR HIGHER *CHILLS OR SWEATING *NAUSEA AND VOMITING THAT IS NOT CONTROLLED WITH YOUR NAUSEA MEDICATION *UNUSUAL SHORTNESS OF BREATH *UNUSUAL BRUISING OR BLEEDING *URINARY PROBLEMS (pain or burning when urinating, or frequent urination) *BOWEL PROBLEMS (unusual diarrhea, constipation, pain near the anus) TENDERNESS IN MOUTH AND THROAT WITH OR WITHOUT PRESENCE OF ULCERS (sore throat, sores in mouth, or a toothache) UNUSUAL RASH, SWELLING OR PAIN  UNUSUAL VAGINAL DISCHARGE OR ITCHING   Items with * indicate a potential emergency and should be followed up as soon as possible or go to the Emergency Department if any problems should occur.  Please show the CHEMOTHERAPY ALERT CARD or IMMUNOTHERAPY ALERT CARD at check-in to the Emergency Department and triage nurse.  Should you have questions after your visit or need to cancel or reschedule your appointment, please contact [facility] [place] [place] [phone]  and follow the prompts.  Office hours are 8:00 a.m. to 4:30 p.m. Monday - Friday. Please note that voicemails left after 4:00 p.m. may not be returned until the following business day.  We are closed weekends and major holidays. You have access to a nurse at all times for urgent questions. Please call the main number to the clinic [phone] and follow the  prompts.  For any non-urgent questions, you may also contact your provider using MyChart. We now offer e-Visits for anyone 18 and older to request care online for non-urgent symptoms. For details visit [email or web address].   Also download the MyChart app! Go to the app store, search "MyChart", open the app, select [facility], and log in with your MyChart username and password.   

## 2023-01-06 NOTE — Progress Notes (Signed)
Patient tolerated treatment well with no complaints voiced. Patient refuses to stay 30 minute wait time. Patient advise on possible complications of leaving before wait time, patient verbalized understanding.. Patient left via wheelchair in stable condition with family.  Vital signs stable, HR 45- in which patient states is a normal HR for her,at discharge.  Follow up as scheduled.

## 2023-01-06 NOTE — Progress Notes (Signed)
Patient is here today for [facility] per provider orders.  She reports overall feeling well.  She did take her claritin this morning with her morning medications and she took tylenol 650mg  around 1145 today. We will proceed with her treatment.

## 2023-01-09 ENCOUNTER — Inpatient Hospital Stay: Payer: PPO

## 2023-01-09 DIAGNOSIS — D500 Iron deficiency anemia secondary to blood loss (chronic): Secondary | ICD-10-CM

## 2023-01-09 LAB — CBC WITH DIFFERENTIAL/PLATELET
Abs Immature Granulocytes: 0.03 10*3/uL (ref 0.00–0.07)
Basophils Absolute: 0 10*3/uL (ref 0.0–0.1)
Basophils Relative: 0 %
Eosinophils Absolute: 0.1 10*3/uL (ref 0.0–0.5)
Eosinophils Relative: 2 %
HCT: 30.3 % — ABNORMAL LOW (ref 36.0–46.0)
Hemoglobin: 9.1 g/dL — ABNORMAL LOW (ref 12.0–15.0)
Immature Granulocytes: 1 %
Lymphocytes Relative: 20 %
Lymphs Abs: 0.9 10*3/uL (ref 0.7–4.0)
MCH: 31.1 pg (ref 26.0–34.0)
MCHC: 30 g/dL (ref 30.0–36.0)
MCV: 103.4 fL — ABNORMAL HIGH (ref 80.0–100.0)
Monocytes Absolute: 0.5 10*3/uL (ref 0.1–1.0)
Monocytes Relative: 11 %
Neutro Abs: 3 10*3/uL (ref 1.7–7.7)
Neutrophils Relative %: 66 %
Platelets: 209 10*3/uL (ref 150–400)
RBC: 2.93 MIL/uL — ABNORMAL LOW (ref 3.87–5.11)
RDW: 16.9 % — ABNORMAL HIGH (ref 11.5–15.5)
WBC: 4.5 10*3/uL (ref 4.0–10.5)
nRBC: 0 % (ref 0.0–0.2)

## 2023-01-09 LAB — BASIC METABOLIC PANEL
Anion gap: 7 (ref 5–15)
BUN: 53 mg/dL — ABNORMAL HIGH (ref 8–23)
CO2: 22 mmol/L (ref 22–32)
Calcium: 10.7 mg/dL — ABNORMAL HIGH (ref 8.9–10.3)
Chloride: 108 mmol/L (ref 98–111)
Creatinine, Ser: 1.57 mg/dL — ABNORMAL HIGH (ref 0.44–1.00)
GFR, Estimated: 33 mL/min — ABNORMAL LOW (ref >60)
Glucose, Bld: 116 mg/dL — ABNORMAL HIGH (ref 70–99)
Potassium: 4.6 mmol/L (ref 3.5–5.1)
Sodium: 137 mmol/L (ref 135–145)

## 2023-01-09 LAB — FERRITIN: Ferritin: 426 ng/mL — ABNORMAL HIGH (ref 11–307)

## 2023-01-09 LAB — IRON AND TIBC
Iron: 110 ug/dL (ref 28–170)
Saturation Ratios: 33 % — ABNORMAL HIGH (ref 10.4–31.8)
TIBC: 333 ug/dL (ref 250–450)
UIBC: 223 ug/dL

## 2023-01-12 NOTE — Progress Notes (Signed)
VIRTUAL VISIT via TELEPHONE NOTE [facility]   I connected with [name] [name]  on 01/13/23 at  1:16 PM by telephone and verified that I am speaking with the correct person using two identifiers.  Location: Patient: Home Provider: [facility]   I discussed the limitations, risks, security and privacy concerns of performing an evaluation and management service by telephone and the availability of in person appointments. I also discussed with the patient that there may be a patient responsible charge related to this service. The patient expressed understanding and agreed to proceed.  REASON FOR VISIT:  Follow-up for iron deficiency anemia   PRIOR THERAPY: Blood transfusions and oral iron tablets   CURRENT THERAPY: Intermittent IV iron  INTERVAL HISTORY:  [name] is contacted today for follow-up of iron deficiency anemia related to chronic GI blood loss.  She was last seen by Rojelio Brenner PA-C on 09/27/2022.  She contacted clinic in May 2024 to report profound fatigue, and labs at that time showed worsening anemia with Hgb 7.8, ferritin 14, and iron saturation 5%.  She received PRBC transfusion on 12/15/2022.  She has required 5 doses of IV Feraheme since her last visit, with most recent given on 01/06/2023.  At today's visit, she reports feeling fair. She reports improved energy and strength after her blood transfusion and iron infusions, but still having significant fatigue.  She is also having frequent UTIs, just started her third antibiotic in the past three months.  She had gross hematuria with her UTI in May 2024, which cleared up after she received antibiotics.   She has had some recurrent "black and sticky" bowel movements intermittently over the past week.  She does report some worsening dyspnea on exertion for the past 2 weeks.   She reports dyspnea on exertion, headaches, and restless legs.   Denies any pica, lightheadedness,  syncope, chest pain.   She is taking iron tablet once daily with mild constipation.  She remains on Eliquis 5 mg BID for history of unprovoked DVT and PE in 2020.    She reports 35% energy and 75% appetite.  She reports that she is maintaining a stable weight.   REVIEW OF SYSTEMS:   Review of Systems  Constitutional:  Positive for malaise/fatigue. Negative for chills, diaphoresis, fever and weight loss.  Respiratory:  Positive for cough and shortness of breath.   Cardiovascular:  Negative for chest pain and palpitations.  Gastrointestinal:  Positive for constipation and melena. Negative for abdominal pain, blood in stool, nausea and vomiting.  Genitourinary:  Positive for dysuria (UTI, on antibiotics).  Neurological:  Positive for headaches. Negative for dizziness.  Psychiatric/Behavioral:  The patient has insomnia.      PHYSICAL EXAM: (per limitations of virtual telephone visit)  The patient is alert and oriented x 3, exhibiting adequate mentation, good mood, and ability to speak in full sentences and execute sound judgement.  ASSESSMENT & PLAN:  1.  Iron deficiency anemia secondary to GI blood loss - Normocytic anemia secondary to chronic blood loss, may also have some anemia related to CKD stage IIIb - Hematology work-up (02/24/2022):  Ferritin 11, iron saturation 7%.  Normal folate, B12, MMA, copper. 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  Normal duodenum and jejunum. - Colonoscopy (12/08/2019): Diverticulosis and polyps - She reports melanotic stool over the past week.  She had 3 days of gross hematuria in May 2024, associated with UTI. - She is on Eliquis 5 mg twice daily.  She  takes iron tablet once daily. - Required 5 doses of IV Feraheme since her last visit, with most recent given on 01/06/2023. - Most recent labs (01/09/2023): Hgb 9.1/MCV 103.4, ferritin 426, iron saturation 33 % (NOTE: Received IV iron 3 days prior to labs being drawn) - PLAN: We will watch closely with CBC + BB sample every 2 weeks. -RTC for office visit and repeat CBC/D and iron panel in 6 weeks - Continue iron tablet daily. - Patient requires multidisciplinary follow-up of her anemia: Advised patient to reach out to Dr. Marletta Lor regarding her recurrent melena.  (She is aware of "alarm symptoms" that would prompt immediate ED evaluation.) Advised patient to reach out to her urologist regarding recurrent UTIs and possible hemorrhagic cystitis. If patient continues to have significant bleeding, we may need to involve cardiology and discussion about continuing Eliquis versus referring for Watchman device instead.  (If anticoagulation were discontinued, she would still be at risk for recurrent VTE though given her history of unprovoked DVT/PE)   2.   Unprovoked right leg DVT and PE: - Doppler on 04/03/2019: DVT in the femoral vein and calf veins. - CT chest PE protocol (04/03/2019): Moderate size pulmonary emboli in the right lower lobe. - She is on Eliquis 5 mg twice daily, also for Afib.   -- She sees Dr. Antoine Poche for cardiology.    3.  Social/family history: - She lives at home with her older son.  She takes care of the garden and does laundry and her ADLs and IADLs.  But lately her energy levels are low and she is not able to do them.  She was a retired tobacco former.  Never smoked. - Sister had anemia.  Mother had uterine cancer and sister had colon cancer.  PLAN SUMMARY: >> CBC/D+ BB sample every 2 weeks >> Labs in 6 weeks = CBC/D, ferritin, iron/TIBC >> OFFICE visit in 6 weeks - same day as labs     I discussed the assessment and treatment plan with the patient. The patient was provided an  opportunity to ask questions and all were answered. The patient agreed with the plan and demonstrated an understanding of the instructions.   The patient was advised to call back or seek an in-person evaluation if the symptoms worsen or if the condition fails to improve as anticipated.  I provided 22 minutes of non-face-to-face time during this encounter.  Carnella Guadalajara, PA-C 01/13/23 1:43 PM

## 2023-01-13 ENCOUNTER — Encounter: Payer: Self-pay | Admitting: Family Medicine

## 2023-01-13 ENCOUNTER — Telehealth (INDEPENDENT_AMBULATORY_CARE_PROVIDER_SITE_OTHER): Payer: PPO | Admitting: Family Medicine

## 2023-01-13 ENCOUNTER — Encounter: Payer: Self-pay | Admitting: Physician Assistant

## 2023-01-13 ENCOUNTER — Inpatient Hospital Stay (HOSPITAL_BASED_OUTPATIENT_CLINIC_OR_DEPARTMENT_OTHER): Payer: PPO | Admitting: Physician Assistant

## 2023-01-13 DIAGNOSIS — Z86711 Personal history of pulmonary embolism: Secondary | ICD-10-CM

## 2023-01-13 DIAGNOSIS — N3001 Acute cystitis with hematuria: Secondary | ICD-10-CM

## 2023-01-13 DIAGNOSIS — R300 Dysuria: Secondary | ICD-10-CM | POA: Diagnosis not present

## 2023-01-13 DIAGNOSIS — D500 Iron deficiency anemia secondary to blood loss (chronic): Secondary | ICD-10-CM

## 2023-01-13 LAB — URINALYSIS, ROUTINE W REFLEX MICROSCOPIC
Bilirubin, UA: NEGATIVE
Ketones, UA: NEGATIVE
Nitrite, UA: NEGATIVE
Specific Gravity, UA: 1.015 (ref 1.005–1.030)
Urobilinogen, Ur: 0.2 mg/dL (ref 0.2–1.0)
pH, UA: 5.5 (ref 5.0–7.5)

## 2023-01-13 LAB — MICROSCOPIC EXAMINATION
Epithelial Cells (non renal): NONE SEEN /hpf (ref 0–10)
RBC, Urine: >30 /hpf — AB (ref 0–2)
Renal Epithel, UA: NONE SEEN /hpf
WBC, UA: >30 /hpf — AB (ref 0–5)

## 2023-01-13 MED ORDER — AMOXICILLIN-POT CLAVULANATE 875-125 MG PO TABS
1.0000 | ORAL_TABLET | Freq: Two times a day (BID) | ORAL | 0 refills | Status: DC
Start: 2023-01-13 — End: 2023-01-23

## 2023-01-13 MED ORDER — FLUCONAZOLE 150 MG PO TABS
150.0000 mg | ORAL_TABLET | Freq: Once | ORAL | 0 refills | Status: AC
Start: 2023-01-13 — End: 2023-01-13

## 2023-01-13 NOTE — Progress Notes (Signed)
MyChart Video visit  Subjective: CC:UTI PCP: Gabriel Earing, FNP ZOX:[id number] [name] [name] is a 82 y.o. female. Patient provides verbal consent for consult held via video.  Due to COVID-19 pandemic this visit was conducted virtually. This visit type was conducted due to national recommendations for restrictions regarding the COVID-19 Pandemic (e.g. social distancing, sheltering in place) in an effort to limit this patient's exposure and mitigate transmission in our community. All issues noted in this document were discussed and addressed.  A physical exam was not performed with this format.   Location of patient: home Location of provider: [facility] Others present for call: none  1. UTI She reports onset of 4 days ago.  She started leftover keflex which she took BID x2.5 days.  She was treated with Keflex about 1 month ago, which was changed to Augmentin due to culture results.  Denies hematuria, nausea, vomiting, fevers or flank pain.  She reports dysuria, frequency and urgency. She sees Dr Galen Daft in [facility] for urology.  This is her 3rd UTI in last 3 months.   ROS: Per HPI  No Known Allergies Past Medical History:  Diagnosis Date   Anemia    Arthritis    Ostearthritis- hips, knees, fingers   Basal cell carcinoma (BCC) of right hand 10/2021   Dx by [facility]   DVT (deep venous thrombosis) (HCC)    Dyspnea    GERD (gastroesophageal reflux disease)    Headache(784.0)    tx. Valproic acid   History of diabetes mellitus    History of kidney stones    Hypertension    Hypothyroidism    Pulmonary embolism (HCC)     Current Outpatient Medications:    acetaminophen (TYLENOL) 325 MG tablet, Take 2 tablets (650 mg total) by mouth every 6 (six) hours as needed for mild pain or headache (or Fever >/= 101)., Disp: 12 tablet, Rfl: 0   allopurinol (ZYLOPRIM) 100 MG tablet, Take 1 tablet (100 mg total) by mouth daily., Disp: 90 tablet, Rfl: 0   amLODipine (NORVASC) 2.5 MG tablet, Take 1 tablet  (2.5 mg total) by mouth daily., Disp: 90 tablet, Rfl: 3   apixaban (ELIQUIS) 5 MG TABS tablet, Take 1 tablet (5 mg total) by mouth 2 (two) times daily., Disp: 60 tablet, Rfl: 6   atorvastatin (LIPITOR) 20 MG tablet, Take 1 tablet by mouth once daily, Disp: 90 tablet, Rfl: 1   dapagliflozin propanediol (FARXIGA) 10 MG TABS tablet, Take 1 tablet (10 mg total) by mouth daily., Disp: 90 tablet, Rfl: 11   Ferrous Sulfate (IRON PO), Take by mouth., Disp: , Rfl:    fluconazole (DIFLUCAN) 150 MG tablet, Take 1 tablet (150 mg total) by mouth every three (3) days as needed., Disp: 3 tablet, Rfl: 0   furosemide (LASIX) 20 MG tablet, Take 1 tablet by mouth once daily, Disp: 90 tablet, Rfl: 3   GNP CRANBERRY EXTRACT PO, Take 500 mg by mouth in the morning., Disp: , Rfl:    levothyroxine (SYNTHROID) 50 MCG tablet, TAKE 1 TABLET BY MOUTH ONCE DAILY BEFORE BREAKFAST, Disp: 90 tablet, Rfl: 3   loratadine (CLARITIN) 10 MG tablet, Take 10 mg by mouth daily., Disp: , Rfl:    losartan (COZAAR) 25 MG tablet, Take 1 tablet (25 mg total) by mouth daily., Disp: 90 tablet, Rfl: 3   oxybutynin (DITROPAN-XL) 10 MG 24 hr tablet, Take 1 tablet (10 mg total) by mouth at bedtime., Disp: 90 tablet, Rfl: 1   pantoprazole (PROTONIX) 40 MG  tablet, Take 1 tablet (40 mg total) by mouth 2 (two) times daily before a meal., Disp: 180 tablet, Rfl: 1   vitamin B-12 (CYANOCOBALAMIN) 50 MCG tablet, Take 50 mcg by mouth daily., Disp: , Rfl:   Gen: Well-appearing female in no acute distress  Assessment/ Plan: 82 y.o. female   Acute cystitis with hematuria - Plan: Urinalysis, Routine w reflex microscopic, Urine Culture, amoxicillin-clavulanate (AUGMENTIN) 875-125 MG tablet, fluconazole (DIFLUCAN) 150 MG tablet  Last urine also reviewed.  Encouraged her to follow-up with urology given recurrence of UTI.  Question need for prophylaxis.  Augmentin sent.  No renal dosing needed at this time.  Diflucan sent for as needed use.  Follow-up as  needed  Start time: 9:43a End time: 9:49a  Total time spent on patient care (including video visit/ documentation): 6 minutes  Hartlyn Reigel Hulen Skains, DO [facility] [phone]

## 2023-01-16 ENCOUNTER — Telehealth: Payer: Self-pay | Admitting: Gastroenterology

## 2023-01-16 NOTE — Telephone Encounter (Signed)
Noted  

## 2023-01-16 NOTE — Telephone Encounter (Signed)
Received notification from Rojelio Brenner, PA-C the patient has had recurrent melena, worsening anemia requiring transfusion and high doses of IV iron.  Will need to arrange follow-up.  Would be okay to schedule with me, Dr. Marletta Lor, or other available APP.  Looks like I have some openings on Thursday, 6/27.  Would be ok to use one of my new patient spots if this works for her.

## 2023-01-16 NOTE — Telephone Encounter (Signed)
Patient aware of her OV with [facility] on July 1st

## 2023-01-17 ENCOUNTER — Ambulatory Visit: Payer: PPO | Admitting: Internal Medicine

## 2023-01-18 ENCOUNTER — Ambulatory Visit: Payer: PPO | Admitting: Gastroenterology

## 2023-01-18 LAB — URINE CULTURE

## 2023-01-21 NOTE — Progress Notes (Unsigned)
Referring Provider: Gabriel Earing, FNP Primary Care Physician:  Gabriel Earing, FNP Primary GI Physician: Dr. Jena Gauss  No chief complaint on file.   HPI:   [name] [name] is a 81 y.o. 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   Prior GI evaluation:  Colonoscopy 12/08/19: Polyps (tubular adenoma), diverticulosis, colonic lipoma EGD 12/08/19: Normal esophagus with 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 Given's Capsule 01/13/20: Scattered small bowel erosions, abnormal small bowel with edema, inflammation/erosion.  *CTA with SMA > 50% stenosis at origin, IMA and celiac patent.  Given's Capsule 05/04/21: ?questionable small bowel AVM per indication on EGD 05/18/21 , unable to locate givens report in Epic.  EGD 05/18/21:  Tiny hiatal hernia, gastric antral vascular ectasia s/p ablation, normal duodenum, normal jejunum. EGD 03/10/22: Small hiatal hernia, GAVE s/p APC, otherwise normal exam.  Following last endoscopy, hemoglobin improved to the 11 range, but has been declining since May 2024.  Hemoglobin as well as 7.8 on 12/14/2022 with low iron as well.  She received 1 unit PRBCs on 12/15/2022.  Also received Feraheme infusion x 3 with last infusion on 01/06/2023.  Most recent labs 01/09/2023 with hemoglobin improved to 9.1, iron levels improved with ferritin 426.  Today:   Past Medical History:  Diagnosis Date   Anemia    Arthritis    Ostearthritis- hips, knees, fingers   Basal cell carcinoma (BCC) of right hand 10/2021   Dx by [facility]   DVT (deep venous thrombosis) (HCC)    Dyspnea    GERD (gastroesophageal reflux disease)    Headache(784.0)    tx.  Valproic acid   History of diabetes mellitus    History of kidney stones    Hypertension    Hypothyroidism    Pulmonary embolism [facility])     Past Surgical History:  Procedure Laterality Date   ABDOMINAL HYSTERECTOMY     BILATERAL HIP ARTHROSCOPY Left    BIOPSY  12/08/2019   Procedure: BIOPSY;  Surgeon: Corbin Ade, MD;  [facility];  Service: Endoscopy;;   CATARACT EXTRACTION, BILATERAL     COLONOSCOPY N/A 12/08/2019   polyps (tubular adenoma), diverticulosis, colonic lipoma, no surveillance due to age   CYSTOSCOPY W/ URETERAL STENT PLACEMENT Right 12/25/2020   Procedure: CYSTOSCOPY WITH RETROGRADE PYELOGRAM/URETERAL STENT PLACEMENT;  Surgeon: Jannifer Hick, MD;  Location: [facility];  Service: Urology;  Laterality: Right;   CYSTOSCOPY/URETEROSCOPY/HOLMIUM LASER/STENT PLACEMENT Right 01/15/2021   Procedure: CYSTOSCOPY/RETROGRADE/URETEROSCOPY/HOLMIUM LASER/STENT EXCHANGE;  Surgeon: Jannifer Hick, MD;  Location: [facility];  Service: Urology;  Laterality: Right;   ESOPHAGOGASTRODUODENOSCOPY N/A 12/08/2019   normal esophagus with 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   ESOPHAGOGASTRODUODENOSCOPY (EGD) WITH PROPOFOL N/A 05/18/2021   Surgeon: Vida Rigger, MD;   Tiny hiatal hernia, gastric antral vascular ectasia s/p ablation, normal duodenum, normal jejunum.   ESOPHAGOGASTRODUODENOSCOPY (EGD) WITH PROPOFOL N/A 03/10/2022   Procedure: ESOPHAGOGASTRODUODENOSCOPY (EGD) WITH PROPOFOL;  Surgeon: Lanelle Bal, DO;  [facility];  Service: Endoscopy;  Laterality: N/A;  10:45am   GIVENS CAPSULE STUDY N/A 01/13/2020   Procedure: GIVENS CAPSULE STUDY;  Surgeon: Corbin Ade, MD;  [facility];  Service: Endoscopy;  Laterality:  N/A;  7:30am   GIVENS CAPSULE STUDY N/A 05/04/2021   Procedure: GIVENS CAPSULE STUDY;  Surgeon: Vida Rigger, MD;  [facility];  Service: Endoscopy;  Laterality: N/A;   MULTIPLE TOOTH EXTRACTIONS      60's   PARATHYROIDECTOMY     POLYPECTOMY  12/08/2019   Procedure: POLYPECTOMY;  Surgeon: Corbin Ade, MD;  [facility];  Service: Endoscopy;;   RADIOFREQUENCY ABLATION  05/18/2021   Procedure: RADIO FREQUENCY ABLATION;  Surgeon: Vida Rigger, MD;  [facility];  Service: Endoscopy;;   TEE WITHOUT CARDIOVERSION N/A 07/06/2021   Procedure: TRANSESOPHAGEAL ECHOCARDIOGRAM (TEE);  Surgeon: Little Ishikawa, MD;  Location[facility] END[facility];  Service: Cardiovascular;  Laterality: N/A;   TOTAL HIP ARTHROPLASTY Right 10/29/2013   Procedure: RIGHT TOTAL HIP ARTHROPLASTY ANTERIOR APPROACH;  Surgeon: Shelda Pal, MD;  Location: [facility];  Service: Orthopedics;  Laterality: Right;    Current Outpatient Medications  Medication Sig Dispense Refill   acetaminophen (TYLENOL) 325 MG tablet Take 2 tablets (650 mg total) by mouth every 6 (six) hours as needed for mild pain or headache (or Fever >/= 101). 12 tablet 0   allopurinol (ZYLOPRIM) 100 MG tablet Take 1 tablet (100 mg total) by mouth daily. 90 tablet 0   amLODipine (NORVASC) 2.5 MG tablet Take 1 tablet (2.5 mg total) by mouth daily. 90 tablet 3   amoxicillin-clavulanate (AUGMENTIN) 875-125 MG tablet Take 1 tablet by mouth 2 (two) times daily. 20 tablet 0   apixaban (ELIQUIS) 5 MG TABS tablet Take 1 tablet (5 mg total) by mouth 2 (two) times daily. 60 tablet 6   atorvastatin (LIPITOR) 20 MG tablet Take 1 tablet by mouth once daily 90 tablet 1   dapagliflozin propanediol (FARXIGA) 10 MG TABS tablet Take 1 tablet (10 mg total) by mouth daily. 90 tablet 11   Ferrous Sulfate (IRON PO) Take by mouth.     furosemide (LASIX) 20 MG tablet Take 1 tablet by mouth once daily 90 tablet 3   GNP CRANBERRY EXTRACT PO Take 500 mg by mouth in the morning.     levothyroxine (SYNTHROID) 50 MCG tablet TAKE 1 TABLET BY MOUTH ONCE DAILY BEFORE BREAKFAST 90 tablet 3   loratadine (CLARITIN) 10 MG tablet Take 10 mg by mouth daily.     losartan  (COZAAR) 25 MG tablet Take 1 tablet (25 mg total) by mouth daily. 90 tablet 3   oxybutynin (DITROPAN-XL) 10 MG 24 hr tablet Take 1 tablet (10 mg total) by mouth at bedtime. 90 tablet 1   pantoprazole (PROTONIX) 40 MG tablet Take 1 tablet (40 mg total) by mouth 2 (two) times daily before a meal. 180 tablet 1   vitamin B-12 (CYANOCOBALAMIN) 50 MCG tablet Take 50 mcg by mouth daily.     No current facility-administered medications for this visit.    Allergies as of 01/23/2023   (No Known Allergies)    Family History  Problem Relation Age of Onset   Colitis Sister 14       alive   Cancer Sister 25       colon   Cancer Mother 69       uterine   Heart disease Father 68       heart failure   Heart attack Brother 46   Healthy Daughter    Healthy Son    Pulmonary embolism Brother 46   Heart disease Brother    Healthy Son    Healthy Son  Social History   Socioeconomic History   Marital status: Widowed    Spouse name: Not on file   Number of children: 4   Years of education: 52   Highest education level: 12th grade  Occupational History   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 She has four adult children and one son lives with her.       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   Social Determinants of Health   Financial Resource Strain: Low Risk  (12/27/2022)   Overall Financial Resource Strain (CARDIA)    Difficulty of Paying Living Expenses: Not hard at all  Food Insecurity: No Food Insecurity (12/27/2022)   Hunger Vital Sign    Worried About Running Out of Food in the Last Year: Never true    Ran Out of  Food in the Last Year: Never true  Transportation Needs: No Transportation Needs (12/27/2022)   PRAPARE - Administrator, Civil Service (Medical): No    Lack of Transportation (Non-Medical): No  Physical Activity: Insufficiently Active (12/27/2022)   Exercise Vital Sign    Days of Exercise per Week: 3 days    Minutes of Exercise per Session: 30 min  Stress: No Stress Concern Present (12/27/2022)   [employer] of Occupational Health - Occupational Stress Questionnaire    Feeling of Stress : Not at all  Social Connections: Moderately Isolated (12/27/2022)   Social Connection and Isolation Panel [NHANES]    Frequency of Communication with Friends and Family: More than three times a week    Frequency of Social Gatherings with Friends and Family: More than three times a week    Attends Religious Services: More than 4 times per year    Active Member of [employer] or Organizations: No    Attends Banker Meetings: Never    Marital Status: Widowed    Review of Systems: Gen: Denies fever, chills, anorexia. Denies fatigue, weakness, weight loss.  CV: Denies chest pain, palpitations, syncope, peripheral edema, and claudication. Resp: Denies dyspnea at rest, cough, wheezing, coughing up blood, and pleurisy. GI: Denies vomiting blood, jaundice, and fecal incontinence.   Denies dysphagia or odynophagia. Derm: Denies rash, itching, dry skin Psych: Denies depression, anxiety, memory loss, confusion. No homicidal or suicidal ideation.  Heme: Denies bruising, bleeding, and enlarged lymph nodes.  Physical Exam: There were no vitals taken for this visit. General:   Alert and oriented. No distress noted. Pleasant and cooperative.  Head:  Normocephalic and atraumatic. Eyes:  Conjuctiva clear without scleral icterus. Heart:  S1, S2 present without murmurs appreciated. Lungs:  Clear to auscultation bilaterally. No wheezes, rales, or rhonchi. No distress.  Abdomen:  +BS, soft, non-tender  and non-distended. No rebound or guarding. No HSM or masses noted. Msk:  Symmetrical without gross deformities. Normal posture. Extremities:  Without edema. Neurologic:  Alert and  oriented x4 Psych:  Normal mood and affect.    Assessment:     Plan:  ***   Ermalinda Memos, PA-C [facility] 01/23/2023

## 2023-01-23 ENCOUNTER — Encounter: Payer: Self-pay | Admitting: *Deleted

## 2023-01-23 ENCOUNTER — Ambulatory Visit (INDEPENDENT_AMBULATORY_CARE_PROVIDER_SITE_OTHER): Payer: PPO | Admitting: Gastroenterology

## 2023-01-23 ENCOUNTER — Telehealth: Payer: Self-pay | Admitting: *Deleted

## 2023-01-23 ENCOUNTER — Encounter: Payer: Self-pay | Admitting: Gastroenterology

## 2023-01-23 VITALS — BP 113/65 | HR 77 | Temp 97.7°F | Ht 63.0 in | Wt 201.2 lb

## 2023-01-23 DIAGNOSIS — K31819 Angiodysplasia of stomach and duodenum without bleeding: Secondary | ICD-10-CM | POA: Insufficient documentation

## 2023-01-23 DIAGNOSIS — D500 Iron deficiency anemia secondary to blood loss (chronic): Secondary | ICD-10-CM

## 2023-01-23 DIAGNOSIS — K921 Melena: Secondary | ICD-10-CM | POA: Diagnosis not present

## 2023-01-23 NOTE — Patient Instructions (Signed)
We will arrange to have an upper endoscopy with Dr. Marletta Lor ASAP.  Keep your upcoming appointment for blood work as ordered by Elizebeth Koller, PA-C on 7/5.  Continue pantoprazole 40 mg twice daily.  If any worsening symptoms of lightheadedness, dizziness, feeling like he would pass out, you will need to proceed to the emergency room.  It was good to see you today!  Will plan to see you back in the office after your upper endoscopy.  Ermalinda Memos, PA-C [facility]

## 2023-01-23 NOTE — Telephone Encounter (Signed)
Pt informed of pre-op on 01/31/23, arrive at 2:15 pm at main entrance of [facility], stop at registration and will be showed where to go.

## 2023-01-27 ENCOUNTER — Inpatient Hospital Stay: Payer: PPO | Attending: Physician Assistant | Admitting: Hematology

## 2023-01-27 ENCOUNTER — Other Ambulatory Visit: Payer: Self-pay

## 2023-01-27 ENCOUNTER — Emergency Department (HOSPITAL_COMMUNITY)
Admission: EM | Admit: 2023-01-27 | Discharge: 2023-01-27 | Disposition: A | Discharge status: Home / Self Care | Payer: PPO | Attending: Emergency Medicine | Admitting: Emergency Medicine

## 2023-01-27 ENCOUNTER — Inpatient Hospital Stay: Payer: PPO

## 2023-01-27 DIAGNOSIS — I4891 Unspecified atrial fibrillation: Secondary | ICD-10-CM | POA: Insufficient documentation

## 2023-01-27 DIAGNOSIS — Z7901 Long term (current) use of anticoagulants: Secondary | ICD-10-CM | POA: Insufficient documentation

## 2023-01-27 DIAGNOSIS — D500 Iron deficiency anemia secondary to blood loss (chronic): Secondary | ICD-10-CM | POA: Diagnosis not present

## 2023-01-27 DIAGNOSIS — Z86718 Personal history of other venous thrombosis and embolism: Secondary | ICD-10-CM | POA: Insufficient documentation

## 2023-01-27 DIAGNOSIS — Z9229 Personal history of other drug therapy: Secondary | ICD-10-CM

## 2023-01-27 DIAGNOSIS — K922 Gastrointestinal hemorrhage, unspecified: Secondary | ICD-10-CM | POA: Insufficient documentation

## 2023-01-27 DIAGNOSIS — D649 Anemia, unspecified: Secondary | ICD-10-CM | POA: Diagnosis not present

## 2023-01-27 DIAGNOSIS — Z79899 Other long term (current) drug therapy: Secondary | ICD-10-CM | POA: Insufficient documentation

## 2023-01-27 DIAGNOSIS — I10 Essential (primary) hypertension: Secondary | ICD-10-CM | POA: Insufficient documentation

## 2023-01-27 LAB — SAMPLE TO BLOOD BANK

## 2023-01-27 LAB — CBC
HCT: 22.8 % — ABNORMAL LOW (ref 36.0–46.0)
Hemoglobin: 6.7 g/dL — CL (ref 12.0–15.0)
MCH: 32.8 pg (ref 26.0–34.0)
MCHC: 29.4 g/dL — ABNORMAL LOW (ref 30.0–36.0)
MCV: 111.8 fL — ABNORMAL HIGH (ref 80.0–100.0)
Platelets: 210 10*3/uL (ref 150–400)
RBC: 2.04 MIL/uL — ABNORMAL LOW (ref 3.87–5.11)
RDW: 16.3 % — ABNORMAL HIGH (ref 11.5–15.5)
WBC: 4.4 10*3/uL (ref 4.0–10.5)
nRBC: 0 % (ref 0.0–0.2)

## 2023-01-27 LAB — BASIC METABOLIC PANEL
Anion gap: 5 (ref 5–15)
BUN: 44 mg/dL — ABNORMAL HIGH (ref 8–23)
CO2: 22 mmol/L (ref 22–32)
Calcium: 10.1 mg/dL (ref 8.9–10.3)
Chloride: 110 mmol/L (ref 98–111)
Creatinine, Ser: 1.32 mg/dL — ABNORMAL HIGH (ref 0.44–1.00)
GFR, Estimated: 41 mL/min — ABNORMAL LOW (ref >60)
Glucose, Bld: 114 mg/dL — ABNORMAL HIGH (ref 70–99)
Potassium: 4.3 mmol/L (ref 3.5–5.1)
Sodium: 137 mmol/L (ref 135–145)

## 2023-01-27 LAB — I-STAT CHEM 8, ED
BUN: 36 mg/dL — ABNORMAL HIGH (ref 8–23)
Calcium, Ion: 1.47 mmol/L — ABNORMAL HIGH (ref 1.15–1.40)
Chloride: 112 mmol/L — ABNORMAL HIGH (ref 98–111)
Creatinine, Ser: 1.4 mg/dL — ABNORMAL HIGH (ref 0.44–1.00)
Glucose, Bld: 125 mg/dL — ABNORMAL HIGH (ref 70–99)
HCT: 22 % — ABNORMAL LOW (ref 36.0–46.0)
Hemoglobin: 7.5 g/dL — ABNORMAL LOW (ref 12.0–15.0)
Potassium: 3.7 mmol/L (ref 3.5–5.1)
Sodium: 143 mmol/L (ref 135–145)
TCO2: 22 mmol/L (ref 22–32)

## 2023-01-27 LAB — TYPE AND SCREEN: ABO/RH(D): O POS

## 2023-01-27 LAB — PREPARE RBC (CROSSMATCH)

## 2023-01-27 LAB — BPAM RBC: Unit Type and Rh: 5100

## 2023-01-27 MED ORDER — SODIUM CHLORIDE 0.9% IV SOLUTION: Freq: Once | INTRAVENOUS | Status: DC

## 2023-01-27 MED ORDER — APIXABAN 2.5 MG PO TABS: 2.5000 mg | ORAL_TABLET | Freq: Two times a day (BID) | ORAL | 1 refills | Status: DC

## 2023-01-27 MED ORDER — PANTOPRAZOLE SODIUM 40 MG IV SOLR: 40.0000 mg | Freq: Two times a day (BID) | INTRAVENOUS | Status: DC

## 2023-01-27 MED ORDER — PANTOPRAZOLE 80MG IVPB - SIMPLE MED
80.0000 mg | Freq: Once | INTRAVENOUS | Status: DC
  Filled 2023-01-27: qty 100

## 2023-01-27 NOTE — ED Provider Notes (Signed)
[place] EMERGENCY DEPARTMENT AT [facility] Provider Note   CSN: [id number] Arrival date & time: 01/27/23  4098     History  No chief complaint on file.   [name] [name] is a 82 y.o. female.  82 year old female with DVT/PE and AF on eliquis, hypertension, and chronic anemia who presents to the emergency department with low hemoglobin.  Since 2021 patient has had low blood counts.  This is thought to be due to iron deficiency anemia from a GI source (gastric antral vascular ectasia).  Has been getting occasional transfusions and over the years has had approximately 4-5 transfusions.  Went in for routine transfusion today and they were unable to perform it due to antibodies that she has developed.  Has been having some black stools but is also on iron.  Does take Eliquis for her DVT/PE.  Does not believe they have talked about an IVC filter.  Says that she is having some dyspnea on exertion.  Denies any fatigue or chest pain.       Home Medications Prior to Admission medications   Medication Sig Start Date End Date Taking? Authorizing Provider  acetaminophen (TYLENOL) 325 MG tablet Take 2 tablets (650 mg total) by mouth every 6 (six) hours as needed for mild pain or headache (or Fever >/= 101). 04/04/19  Yes Emokpae, Courage, MD  allopurinol (ZYLOPRIM) 100 MG tablet Take 1 tablet (100 mg total) by mouth daily. 12/30/22  Yes Gabriel Earing, FNP  amLODipine (NORVASC) 2.5 MG tablet Take 1 tablet (2.5 mg total) by mouth daily. 05/09/22  Yes Gabriel Earing, FNP  atorvastatin (LIPITOR) 20 MG tablet Take 1 tablet by mouth once daily 10/24/22  Yes Gabriel Earing, FNP  dapagliflozin propanediol (FARXIGA) 10 MG TABS tablet Take 1 tablet (10 mg total) by mouth daily. 03/23/22  Yes Gabriel Earing, FNP  Ferrous Sulfate (IRON PO) Take 1 tablet by mouth daily.   Yes [provider]  furosemide (LASIX) 20 MG tablet Take 1 tablet by mouth once daily 10/11/22  Yes Hochrein,  Fayrene Fearing, MD  GNP CRANBERRY EXTRACT PO Take 500 mg by mouth in the morning.   Yes [provider]  levothyroxine (SYNTHROID) 50 MCG tablet TAKE 1 TABLET BY MOUTH ONCE DAILY BEFORE BREAKFAST 08/23/22  Yes Gabriel Earing, FNP  loratadine (CLARITIN) 10 MG tablet Take 10 mg by mouth daily.   Yes [provider]  losartan (COZAAR) 25 MG tablet Take 1 tablet (25 mg total) by mouth daily. 10/20/22  Yes Gabriel Earing, FNP  oxybutynin (DITROPAN-XL) 10 MG 24 hr tablet Take 1 tablet (10 mg total) by mouth at bedtime. 12/27/22  Yes Gabriel Earing, FNP  pantoprazole (PROTONIX) 40 MG tablet Take 1 tablet (40 mg total) by mouth 2 (two) times daily before a meal. 12/27/22  Yes Gabriel Earing, FNP  vitamin B-12 (CYANOCOBALAMIN) 50 MCG tablet Take 50 mcg by mouth daily.   Yes [provider]  apixaban (ELIQUIS) 2.5 MG TABS tablet Take 1 tablet (2.5 mg total) by mouth 2 (two) times daily. 01/27/23   Rondel Baton, MD      Allergies    Patient has no known allergies.    Review of Systems   Review of Systems  Physical Exam Updated Vital Signs BP (!) 122/49 (BP Location: Right Arm)   Pulse (!) 54   Temp 98.4 F (36.9 C)   Resp 18   Ht 5\' 3"  (1.6 m)  Wt 89.8 kg   SpO2 100%   BMI 35.07 kg/m  Physical Exam Vitals and nursing note reviewed.  Constitutional:      General: She is not in acute distress.    Appearance: She is well-developed.  HENT:     Head: Normocephalic and atraumatic.     Right Ear: External ear normal.     Left Ear: External ear normal.     Nose: Nose normal.  Eyes:     Extraocular Movements: Extraocular movements intact.     Conjunctiva/sclera: Conjunctivae normal.     Pupils: Pupils are equal, round, and reactive to light.  Cardiovascular:     Rate and Rhythm: Normal rate and regular rhythm.     Heart sounds: No murmur heard. Pulmonary:     Effort: Pulmonary effort is normal. No respiratory distress.     Breath sounds: Normal breath sounds.   Abdominal:     General: Abdomen is flat. There is no distension.     Palpations: Abdomen is soft. There is no mass.     Tenderness: There is no abdominal tenderness. There is no guarding.  Musculoskeletal:     Cervical back: Normal range of motion and neck supple.     Right lower leg: No edema.     Left lower leg: No edema.  Skin:    General: Skin is warm and dry.  Neurological:     Mental Status: She is alert and oriented to person, place, and time. Mental status is at baseline.  Psychiatric:        Mood and Affect: Mood normal.     ED Results / Procedures / Treatments   Labs (all labs ordered are listed, but only abnormal results are displayed) Labs Reviewed  BASIC METABOLIC PANEL - Abnormal; Notable for the following components:      Result Value   Glucose, Bld 114 (*)    BUN 44 (*)    Creatinine, Ser 1.32 (*)    GFR, Estimated 41 (*)    All other components within normal limits    EKG None  Radiology No results found.  Procedures Procedures    Medications Ordered in ED Medications  0.9 %  sodium chloride infusion (Manually program via Guardrails IV Fluids) (has no administration in time range)    ED Course/ Medical Decision Making/ A&P Clinical Course as of 01/27/23 1903  [name] [name] Jan 27, 2023  1336 Dr Morrison Old from hospitalist consulted.  Dr Kendell Bane from GI consulted. [RP]  1620 Signed out to Dr Suezanne Jacquet [RP]  1610 Received sign out from Dr. Eloise Harman presenting with stable GI bleed, having symptomatic anemia. Oncologist referred her for transfusion. Patient has history of DVT and PE. Hgb was 6.7. Medicine and GI consulted. Plan for outpatient endoscopy and medicine did not feel admission was warranted for chronic process. Will re-assess and obtain post-transfusion CBC. [WS]    Clinical Course User Index [RP] Rondel Baton, MD [WS] Lonell Grandchild, MD                             Medical Decision Making Amount and/or Complexity of Data Reviewed Labs:  ordered.  Risk Prescription drug management.   [name] [name] is a 82 y.o. female with comorbidities that complicate the patient evaluation including DVT/PE and AF on eliquis, hypertension, and chronic anemia who presents to the emergency department with low hemoglobin.   Initial Ddx:  GI bleed, symptomatic anemia, anemia,  MI  MDM/Course:  The patient is likely having symptomatic anemia from GI bleed given her lower hemoglobin.  Transfuse to goal 7 such does not have a history of stroke or MI.  Does have significant antibodies so may have some delays in obtaining blood.  Did initially feel that patient would benefit from observation given the fact that she is having a GI bleed and is on Eliquis.  Discussed this with GI who stated that she has a planned EGD this week that would likely not scope her this hospitalization.  Did also discussed with medicine who felt that admission is not indicated at this time.  They have personally evaluated the patient and read the note.  Upon re-evaluation patient remained stable.  She was consented and ordered for blood.  Awaiting blood products at this time it was signed out to the oncoming physician awaiting transfusion and reassessment.  Medicine did recommend that her Eliquis be reduced to 2.5 mg twice daily with last dose Saturday night before her EGD.  This patient presents to the ED for concern of complaints listed in HPI, this involves an extensive number of treatment options, and is a complaint that carries with it a high risk of complications and morbidity. Disposition including potential need for admission considered.   Dispo: DC Home. Return precautions discussed including, but not limited to, those listed in the AVS. Allowed pt time to ask questions which were answered fully prior to dc.  Additional history obtained from family Records reviewed Outpatient Clinic Notes The following labs were independently interpreted: CBC and show acute  anemia I personally reviewed and interpreted cardiac monitoring: normal sinus rhythm  I have reviewed the patients home medications and made adjustments as needed Consults: Gastroenterology and Hospitalist Social Determinants of health:  Elderly         Final Clinical Impression(s) / ED Diagnoses Final diagnoses:  Gastrointestinal hemorrhage, unspecified gastrointestinal hemorrhage type  Symptomatic anemia  Hx of long term use of blood thinners    Rx / DC Orders ED Discharge Orders          Ordered    apixaban (ELIQUIS) 2.5 MG TABS tablet  2 times daily        01/27/23 1900           CRITICAL CARE Performed by: Rondel Baton   Total critical care time: 30 minutes  Critical care time was exclusive of separately billable procedures and treating other patients.  Critical care was necessary to treat or prevent imminent or life-threatening deterioration.  Critical care was time spent personally by me on the following activities: development of treatment plan with patient and/or surrogate as well as nursing, discussions with consultants, evaluation of patient's response to treatment, examination of patient, obtaining history from patient or surrogate, ordering and performing treatments and interventions, ordering and review of laboratory studies, ordering and review of radiographic studies, pulse oximetry and re-evaluation of patient's condition.    Rondel Baton, MD 01/27/23 [phone]

## 2023-01-27 NOTE — Progress Notes (Signed)
Received notification from hematology/oncology PA Rebekah Pennington this morning that patient was being sent to the ED for blood transfusion as a result of a hemoglobin of 6.7 and their inability to give blood products as outpatient today due to antibodies.  Due to her antibodies blood products would not be available to be given until later this evening so she proceeded to the ED.  This lady is an 82-year-old female with a history of DVT/PE and A-fib who has been maintained on Eliquis as well as a history of hypertension and chronic anemia.  She was recently seen by our office on 01/23/2023 for evaluation of her worsening anemia despite transfusions and IV iron as well as recurrent melena.  She has had multiple prior endoscopic evaluations including a colonoscopy in 2021, multiple EGDs since 2021 as well as 2 capsule studies which have revealed GAVE and she has underwent therapeutic treatment previously with ablation and APC therapy.  During her office visit she had reported a 3-week history of melena and has been having some dyspnea on exertion as well as fatigue.  This is all likely in the setting of her chronic anticoagulation.  Patient has been scheduled for an outpatient EGD which she is scheduled to undergo 02/01/2023.  We have recommended that she continue with blood transfusion in the ED and to keep her outpatient EGD scheduled.  [name] Mcmorris, MSN, APRN, FNP-BC, AGACNP-BC [facility] [address]  

## 2023-01-27 NOTE — Discharge Instructions (Addendum)
You were seen for your anemia in the emergency department.   At home, please decrease your Eliquis dose to 2.5 mg twice a day to prevent worsening bleeding.  Hold your blood thinner from Sunday until after your EGD and ask the GI doctors when it is safe to resume it.  Check your MyChart online for the results of any tests that had not resulted by the time you left the emergency department.   Follow-up with your primary doctor in 2-3 days regarding your visit.  Talk to them to see if you need an IVC filter for your blood clots.  Please also talk to Dr. Antoine Poche from cardiology to see if you may be a candidate for a Watchman procedure.  If these procedures are done it may be possible to stop there blood thinners.  Return immediately to the emergency department if you experience any of the following: Worsening shortness of breath, fatigue, chest pain, rectal bleeding, or any other concerning symptoms.    Thank you for visiting our Emergency Department. It was a pleasure taking care of you today.

## 2023-01-27 NOTE — ED Notes (Signed)
Nurse contacted lab to check on how [name] [name] it would be before pt would get blood. Lab stated they have not heard anything and are unsure of how [name] [name].

## 2023-01-27 NOTE — H&P (Signed)
Consult Note   Patient: [name] [name] WUJ:[id number] DOB: Jun 04, 1941 DOA: 01/27/2023 DOS: the patient was seen and examined on 01/27/2023 PCP: Gabriel Earing, FNP  Requesting Physician: Dr. Eloise Harman  Chief Complaint: Need for transfusion  HPI: [name] [name] is an 82 y.o. female with a history of DVT/PE in 2020, PAF Dx thereafter on eliquis, chronic GI bleeding due to GAVE dependent on frequent IV iron infusions and occasional RBC transfusions who was sent to the ED today from the cancer center where she was scheduled to have RBC transfusion. She was sent because there wouldn't be compatible blood products available (due to pt's antibodies) by 4:30pm. She reports 3 weeks of dark stools which have been relatively unchanged during that time, no more frequent of late. Sought GI evaluation Monday and had EGD scheduled for 7/10. She's had this bleeding before, though it historically has gotten much better when her eliquis dose has been reduced to 2.5mg  BID. She denies any chest pain, palpitations, dyspnea at rest, but has had decreased exertional capacity for the past week. She's had hematuria during episodes of UTI most recently last month that is currently resolved. No other bleeding.  On arrival to the ED, she's in sinus bradycardia, normotensive, with hgb not repeated, was 6.7g/dl this AM with normal platelets. BUN 44, down from 53, Cr 1.32, down from 1.57 at last check 6/17. EDP contacted GI, Dr. Jena Gauss, who recommended keeping the plan for EGD on Wednesday, no inpatient procedure would be recommended.  Review of Systems: As mentioned in the history of present illness. All other systems reviewed and are negative. Past Medical History:  Diagnosis Date   Anemia    Arthritis    Ostearthritis- hips, knees, fingers   Basal cell carcinoma (BCC) of right hand 10/2021   Dx by [facility]   DVT (deep venous thrombosis) (HCC)    Dyspnea    GERD (gastroesophageal reflux disease)     Headache(784.0)    tx. Valproic acid   History of diabetes mellitus    History of kidney stones    Hypertension    Hypothyroidism    Pulmonary embolism [facility])    Past Surgical History:  Procedure Laterality Date   ABDOMINAL HYSTERECTOMY     BILATERAL HIP ARTHROSCOPY Left    BIOPSY  12/08/2019   Procedure: BIOPSY;  Surgeon: Corbin Ade, MD;  Location[facility];  Service: Endoscopy;;   CATARACT EXTRACTION, BILATERAL     COLONOSCOPY N/A 12/08/2019   polyps (tubular adenoma), diverticulosis, colonic lipoma, no surveillance due to age   CYSTOSCOPY W/ URETERAL STENT PLACEMENT Right 12/25/2020   Procedure: CYSTOSCOPY WITH RETROGRADE PYELOGRAM/URETERAL STENT PLACEMENT;  Surgeon: Jannifer Hick, MD;  Location: [facility];  Service: Urology;  Laterality: Right;   CYSTOSCOPY/URETEROSCOPY/HOLMIUM LASER/STENT PLACEMENT Right 01/15/2021   Procedure: CYSTOSCOPY/RETROGRADE/URETEROSCOPY/HOLMIUM LASER/STENT EXCHANGE;  Surgeon: Jannifer Hick, MD;  Location: [facility];  Service: Urology;  Laterality: Right;   ESOPHAGOGASTRODUODENOSCOPY N/A 12/08/2019   normal esophagus with 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   ESOPHAGOGASTRODUODENOSCOPY (EGD) WITH PROPOFOL N/A 05/18/2021   Surgeon: Vida Rigger, MD;   Tiny hiatal hernia, gastric antral vascular ectasia s/p ablation, normal duodenum, normal jejunum.   ESOPHAGOGASTRODUODENOSCOPY (EGD) WITH PROPOFOL N/A 03/10/2022   Procedure: ESOPHAGOGASTRODUODENOSCOPY (EGD) WITH PROPOFOL;  Surgeon: Lanelle Bal, DO;  [facility];  Service: Endoscopy;  Laterality: N/A;  10:45am   GIVENS CAPSULE STUDY N/A 01/13/2020   Procedure: GIVENS CAPSULE STUDY;  Surgeon: Jena Gauss,  Gerrit Friends, MD;  [facility];  Service: Endoscopy;  Laterality: N/A;  7:30am   GIVENS CAPSULE STUDY N/A 05/04/2021   Procedure: GIVENS CAPSULE STUDY;  Surgeon: Vida Rigger, MD;  [facility];  Service: Endoscopy;  Laterality: N/A;    MULTIPLE TOOTH EXTRACTIONS     60's   PARATHYROIDECTOMY     POLYPECTOMY  12/08/2019   Procedure: POLYPECTOMY;  Surgeon: Corbin Ade, MD;  [facility];  Service: Endoscopy;;   RADIOFREQUENCY ABLATION  05/18/2021   Procedure: RADIO FREQUENCY ABLATION;  Surgeon: Vida Rigger, MD;  [facility];  Service: Endoscopy;;   TEE WITHOUT CARDIOVERSION N/A 07/06/2021   Procedure: TRANSESOPHAGEAL ECHOCARDIOGRAM (TEE);  Surgeon: Little Ishikawa, MD;  Location[facility] ENDOSCOPY;  Service: Cardiovascular;  Laterality: N/A;   TOTAL HIP ARTHROPLASTY Right 10/29/2013   Procedure: RIGHT TOTAL HIP ARTHROPLASTY ANTERIOR APPROACH;  Surgeon: Shelda Pal, MD;  Location: [facility];  Service: Orthopedics;  Laterality: Right;   Social History:  reports that she has never smoked. She has never used smokeless tobacco. She reports that she does not drink alcohol and does not use drugs.  No Known Allergies  Family History  Problem Relation Age of Onset   Colitis Sister 36       alive   Cancer Sister 39       colon   Cancer Mother 11       uterine   Heart disease Father 64       heart failure   Heart attack Brother 34   Healthy Daughter    Healthy Son    Pulmonary embolism Brother 46   Heart disease Brother    Healthy Son    Healthy Son     Prior to Admission medications   Medication Sig Start Date End Date Taking? Authorizing Provider  acetaminophen (TYLENOL) 325 MG tablet Take 2 tablets (650 mg total) by mouth every 6 (six) hours as needed for mild pain or headache (or Fever >/= 101). 04/04/19  Yes Emokpae, Courage, MD  allopurinol (ZYLOPRIM) 100 MG tablet Take 1 tablet (100 mg total) by mouth daily. 12/30/22  Yes Gabriel Earing, FNP  amLODipine (NORVASC) 2.5 MG tablet Take 1 tablet (2.5 mg total) by mouth daily. 05/09/22  Yes Gabriel Earing, FNP  apixaban (ELIQUIS) 5 MG TABS tablet Take 1 tablet (5 mg total) by mouth 2 (two) times daily. 09/07/22  Yes Rollene Rotunda, MD   atorvastatin (LIPITOR) 20 MG tablet Take 1 tablet by mouth once daily 10/24/22  Yes Gabriel Earing, FNP  dapagliflozin propanediol (FARXIGA) 10 MG TABS tablet Take 1 tablet (10 mg total) by mouth daily. 03/23/22  Yes Gabriel Earing, FNP  Ferrous Sulfate (IRON PO) Take 1 tablet by mouth daily.   Yes [provider]  furosemide (LASIX) 20 MG tablet Take 1 tablet by mouth once daily 10/11/22  Yes Hochrein, Fayrene Fearing, MD  GNP CRANBERRY EXTRACT PO Take 500 mg by mouth in the morning.   Yes [provider]  levothyroxine (SYNTHROID) 50 MCG tablet TAKE 1 TABLET BY MOUTH ONCE DAILY BEFORE BREAKFAST 08/23/22  Yes Gabriel Earing, FNP  loratadine (CLARITIN) 10 MG tablet Take 10 mg by mouth daily.   Yes [provider]  losartan (COZAAR) 25 MG tablet Take 1 tablet (25 mg total) by mouth daily. 10/20/22  Yes Gabriel Earing, FNP  oxybutynin (DITROPAN-XL) 10 MG 24 hr tablet Take 1 tablet (10 mg total) by mouth at  bedtime. 12/27/22  Yes Gabriel Earing, FNP  pantoprazole (PROTONIX) 40 MG tablet Take 1 tablet (40 mg total) by mouth 2 (two) times daily before a meal. 12/27/22  Yes Gabriel Earing, FNP  vitamin B-12 (CYANOCOBALAMIN) 50 MCG tablet Take 50 mcg by mouth daily.   Yes [provider]    Physical Exam: Vitals:   01/27/23 1020 01/27/23 1022  BP:  (!) 131/45  Pulse:  (!) 45  Resp:  16  Temp:  98 F (36.7 C)  TempSrc:  Oral  SpO2:  97%  Weight: 89.8 kg   Height: 5\' 3"  (1.6 m)   Gen: Older female, WDWN, sitting in bed in no distress Pulm: Clear, nonlabored  CV: Regular bradycardia without MRG or pitting edema GI: Soft, NT, ND, +BS Neuro: Alert and oriented. No new focal deficits. Ext: Warm, no deformities. Normal muscle bulk and tone. Skin: No rashes, lesions or ulcers on visualized skin   Assessment and Plan: Aichatou Mowry is an exceedingly pleasant 82yo female who presents accompanied by her daughter for transfusion to the ED, referred from cancer  center due to blood antibodies. She has chronic blood loss anemia which has progressed with hgb down to 6.7g/dl. This is mildly symptomatic, though her stable vital signs and history (including no increase in her bleeding symptoms of late) would suggest this is not hyperacute. GI was contacted, recommending keeping plan for EGD 7/10. EDP rightfully concerned for symptomatic anemia in anticoagulated patient known to be bleeding.   This is an interesting situation. I told Mrs. Erman and her daughter that I don't think I've ever NOT admitted an actively bleeding patient with symptomatic, transfusion-dependent anemia, particularly one on anticoagulation and in their 28's. I did offer to place her in observation. However, she appears very well, vital signs show compensation. If she has no increased bleeding and symptoms are better after transfusion, since GI has no plan to perform endoscopy ahead of schedule next week, I believe it is reasonable to send her home. The patient would prefer going home to admission, has many family members who live around her, and seems to grasp strict return precautions.   The question of what to do both short and long term with anticoagulation is complicated. Since she had a single episode of DVT/PE 4 years ago without known recurrence, without strong family history of clots, this is not an absolute indication for anticoagulation. Her CHA2DS2-VASc score is 63 (age x2, sex, CHF, HTN, DM), so anticoagulation is indicated lifelong, though perhaps in a patient with recurrent GI bleeding that is transfusion-dependent, perhaps revisiting risk/benefit of anticoagulation and/or referral to consider Watchman would be helpful.  I recommend:  - Continuing PPI BID - Reducing eliquis to 2.5mg  po BID. She's 82, her Cr recently was >1.5 and fluctuates. In the setting of known oozing GI bleed and indication for anticoagulation being AFib (not active clot), as well as her history reported of having  much less bleeding on that dose, I believe it is a reasonable decision to keep her on a reduced dose. Given her creatinine technically < 1.5 currently, this decision is not completely supported by guidelines, it is a clearcut patient-centered recommendation that comes after shared decision making with the patient.  - Stopping eliquis pre-procedure just as planned previously.  - Following up with cardiology, Dr. Antoine Poche, to further discuss Watchman LAA closure.  - If vitals remain stable post-transfusion, patient would prefer to go home from the ED, so admission deferred. Discussed with  Dr. Kathline Magic, MD 01/27/2023 2:24 PM  For on call review [email or web address].

## 2023-01-27 NOTE — ED Triage Notes (Signed)
Pt referred by the cancer center to come and get blood d/t antibodies in blood.

## 2023-01-27 NOTE — Progress Notes (Signed)
HGB 6.7. Orders received to give 2 units of PRBC's per R. Pennington PA. Blood bank called and patient has positive antibodies. Per [facility]  blood bank will not be able to test blood and get 2 units to our clinic by 4:30 pm today.   Orders received from R. Pennington PA for patient to present to the ED for blood transfusion. ER charge nurse called and report given. ER physician R. Patterson notified by R. Penninton PA.

## 2023-01-27 NOTE — ED Provider Notes (Signed)
   Procedures  .Critical Care  Performed by: Lonell Grandchild, MD Authorized by: Lonell Grandchild, MD   Critical care provider statement:    Critical care time (minutes):  30   Critical care was necessary to treat or prevent imminent or life-threatening deterioration of the following conditions:  Circulatory failure   Critical care was time spent personally by me on the following activities:  Development of treatment plan with patient or surrogate, discussions with consultants, evaluation of patient's response to treatment, examination of patient, ordering and review of laboratory studies, ordering and review of radiographic studies, ordering and performing treatments and interventions, pulse oximetry, re-evaluation of patient's condition and review of old charts   ED Course / MDM   Clinical Course as of 01/27/23 2318  Fri Jan 27, 2023  1336 Dr Morrison Old from hospitalist consulted.  Dr Kendell Bane from GI consulted. [RP]  1620 Signed out to Dr Suezanne Jacquet [RP]  1610 Received sign out from Dr. Eloise Harman presenting with stable GI bleed, having symptomatic anemia. Oncologist referred her for transfusion. Patient has history of DVT and PE. Hgb was 6.7. Medicine and GI consulted. Plan for outpatient endoscopy and medicine did not feel admission was warranted for chronic process. Will re-assess and obtain post-transfusion CBC. [WS]  2316 Repeat hemoglobin 7.5.  Patient reports she feels much better.  Discussed she should be very vigilant for any worsening symptoms.  Her daughter is with her.  They report that they will be keeping a close eye out for any worsening of symptoms and return immediately.  She is decreasing her Eliquis dosage to 2.5 mg. Will discharge patient to home. All questions answered. Patient comfortable with plan of discharge. Return precautions discussed with patient and specified on the after visit summary.  [WS]    Clinical Course User Index [RP] Rondel Baton, MD [WS] Lonell Grandchild, MD   Medical Decision Making Amount and/or Complexity of Data Reviewed Labs: ordered.  Risk Prescription drug management.         Lonell Grandchild, MD 01/27/23 [phone]

## 2023-01-27 NOTE — H&P (View-Only) (Signed)
Received notification from hematology/oncology PA Rojelio Brenner this morning that patient was being sent to the ED for blood transfusion as a result of a hemoglobin of 6.7 and their inability to give blood products as outpatient today due to antibodies.  Due to her antibodies blood products would not be available to be given until later this evening so she proceeded to the ED.  This lady is an 82 year old female with a history of DVT/PE and A-fib who has been maintained on Eliquis as well as a history of hypertension and chronic anemia.  She was recently seen by our office on 01/23/2023 for evaluation of her worsening anemia despite transfusions and IV iron as well as recurrent melena.  She has had multiple prior endoscopic evaluations including a colonoscopy in 2021, multiple EGDs since 2021 as well as 2 capsule studies which have revealed GAVE and she has underwent therapeutic treatment previously with ablation and APC therapy.  During her office visit she had reported a 3-week history of melena and has been having some dyspnea on exertion as well as fatigue.  This is all likely in the setting of her chronic anticoagulation.  Patient has been scheduled for an outpatient EGD which she is scheduled to undergo 02/01/2023.  We have recommended that she continue with blood transfusion in the ED and to keep her outpatient EGD scheduled.  Brooke Bonito, MSN, APRN, FNP-BC, AGACNP-BC [facility]

## 2023-01-27 NOTE — Progress Notes (Signed)
CRITICAL VALUE ALERT Critical value received:  HGB 6.7. Date of notification:  01-27-2023. Time of notification: 08:30 am.  Critical value read back:  Yes.   Nurse who received alert:  B.Annalea Alguire RN .  MD notified time and response:  R. Pennington PA @ 08:35 am. Patient will receive 2 units of blood today. Scheduling and patient aware.

## 2023-01-28 LAB — BPAM RBC
Blood Product Expiration Date: 202408062359
Blood Product Expiration Date: 202408062359
Unit Type and Rh: 5100

## 2023-01-28 LAB — TYPE AND SCREEN
Donor AG Type: NEGATIVE
Donor AG Type: NEGATIVE
Unit division: 0

## 2023-01-30 NOTE — Patient Instructions (Signed)
[name] [name]  01/30/2023     @PREFPERIOPPHARMACY @   Your procedure is scheduled on  02/01/2023.   [facility] to [facility] at  0700  A.M.   Call this number if you have problems the morning of surgery:  [phone]  If you experience any cold or flu symptoms such as cough, fever, chills, shortness of breath, etc. between now and your scheduled surgery, please notify us at the above number.   Remember:  Follow the diet instructions given to you by the office.      Your last dose farxiga should be on 01/28/2023 and your last dose of eliquis should be 01/29/2023.     Take these medicines the morning of surgery with A SIP OF WATER       allopurinol, amlodipine, levothyroxine, claritin, ditropan, pantoprazole.     Do not wear jewelry, make-up or nail polish, including gel polish,  artificial nails, or any other type of covering on natural nails (fingers and  toes).  Do not wear lotions, powders, or perfumes, or deodorant.  Do not shave 48 hours prior to surgery.  Men may shave face and neck.  Do not bring valuables to the hospital.  [facility] is not responsible for any belongings or valuables.  Contacts, dentures or bridgework may not be worn into surgery.  Leave your suitcase in the car.  After surgery it may be brought to your room.  For patients admitted to the hospital, discharge time will be determined by your treatment team.  Patients discharged the day of surgery will not be allowed to drive home and must have someone with them for 24 hours.    Special instructions:   DO NOT smoke tobacco or vape for 24 hours before your procedure.  Please read over the following fact sheets that you were given. Anesthesia Post-op Instructions and Care and Recovery After Surgery      Upper Endoscopy, Adult, Care After After the procedure, it is common to have a sore throat. It is also common to have: Mild stomach pain or discomfort. Bloating. Nausea. Follow these  instructions at home: The instructions below may help you care for yourself at home. Your health care provider may give you more instructions. If you have questions, ask your health care provider. If you were given a sedative during the procedure, it can affect you for several hours. Do not drive or operate machinery until your health care provider says that it is safe. If you will be going home right after the procedure, plan to have a responsible adult: Take you home from the hospital or clinic. You will not be allowed to drive. Care for you for the time you are told. Follow instructions from your health care provider about what you may eat and drink. Return to your normal activities as told by your health care provider. Ask your health care provider what activities are safe for you. Take over-the-counter and prescription medicines only as told by your health care provider. Contact a health care provider if you: Have a sore throat that lasts longer than one day. Have trouble swallowing. Have a fever. Get help right away if you: Vomit blood or your vomit looks like coffee grounds. Have bloody, black, or tarry stools. Have a very bad sore throat or you cannot swallow. Have difficulty breathing or very bad pain in your chest or abdomen. These symptoms may be an emergency. Get help right away. Call 911. Do not wait to  see if the symptoms will go away. Do not drive yourself to the hospital. Summary After the procedure, it is common to have a sore throat, mild stomach discomfort, bloating, and nausea. If you were given a sedative during the procedure, it can affect you for several hours. Do not drive until your health care provider says that it is safe. Follow instructions from your health care provider about what you may eat and drink. Return to your normal activities as told by your health care provider. This information is not intended to replace advice given to you by your health care  provider. Make sure you discuss any questions you have with your health care provider. Document Revised: 10/20/2021 Document Reviewed: 10/20/2021 Elsevier Patient Education  2024 [employer] Monitored Anesthesia Care, Care After The following information offers guidance on how to care for yourself after your procedure. Your health care provider may also give you more specific instructions. If you have problems or questions, contact your health care provider. What can I expect after the procedure? After the procedure, it is common to have: Tiredness. Little or no memory about what happened during or after the procedure. Impaired judgment when it comes to making decisions. Nausea or vomiting. Some trouble with balance. Follow these instructions at home: For the time period you were told by your health care provider:  Rest. Do not participate in activities where you could fall or become injured. Do not drive or use machinery. Do not drink alcohol. Do not take sleeping pills or medicines that cause drowsiness. Do not make important decisions or sign legal documents. Do not take care of children on your own. Medicines Take over-the-counter and prescription medicines only as told by your health care provider. If you were prescribed antibiotics, take them as told by your health care provider. Do not stop using the antibiotic even if you start to feel better. Eating and drinking Follow instructions from your health care provider about what you may eat and drink. Drink enough fluid to keep your urine pale yellow. If you vomit: Drink clear fluids slowly and in small amounts as you are able. Clear fluids include water, ice chips, low-calorie sports drinks, and fruit juice that has water added to it (diluted fruit juice). Eat light and bland foods in small amounts as you are able. These foods include bananas, applesauce, rice, lean meats, toast, and crackers. General instructions  Have a  responsible adult stay with you for the time you are told. It is important to have someone help care for you until you are awake and alert. If you have sleep apnea, surgery and some medicines can increase your risk for breathing problems. Follow instructions from your health care provider about wearing your sleep device: When you are sleeping. This includes during daytime naps. While taking prescription pain medicines, sleeping medicines, or medicines that make you drowsy. Do not use any products that contain nicotine or tobacco. These products include cigarettes, chewing tobacco, and vaping devices, such as e-cigarettes. If you need help quitting, ask your health care provider. Contact a health care provider if: You feel nauseous or vomit every time you eat or drink. You feel light-headed. You are still sleepy or having trouble with balance after 24 hours. You get a rash. You have a fever. You have redness or swelling around the IV site. Get help right away if: You have trouble breathing. You have new confusion after you get home. These symptoms may be an emergency. Get help right away.  Call 911. Do not wait to see if the symptoms will go away. Do not drive yourself to the hospital. This information is not intended to replace advice given to you by your health care provider. Make sure you discuss any questions you have with your health care provider. Document Revised: 12/06/2021 Document Reviewed: 12/06/2021 Elsevier Patient Education  2024 [employer].

## 2023-01-31 ENCOUNTER — Encounter (HOSPITAL_COMMUNITY)
Admission: RE | Admit: 2023-01-31 | Discharge: 2023-01-31 | Disposition: A | Discharge status: Home / Self Care | Payer: PPO | Source: Ambulatory Visit | Attending: Internal Medicine | Admitting: Internal Medicine

## 2023-01-31 ENCOUNTER — Encounter (HOSPITAL_COMMUNITY): Payer: Self-pay

## 2023-01-31 VITALS — BP 137/46 | HR 50 | Temp 98.3°F | Resp 18 | Ht 63.0 in | Wt 198.0 lb

## 2023-01-31 DIAGNOSIS — R7303 Prediabetes: Secondary | ICD-10-CM

## 2023-01-31 DIAGNOSIS — D500 Iron deficiency anemia secondary to blood loss (chronic): Secondary | ICD-10-CM | POA: Insufficient documentation

## 2023-01-31 DIAGNOSIS — Z01812 Encounter for preprocedural laboratory examination: Secondary | ICD-10-CM | POA: Insufficient documentation

## 2023-01-31 DIAGNOSIS — N1831 Chronic kidney disease, stage 3a: Secondary | ICD-10-CM | POA: Insufficient documentation

## 2023-01-31 HISTORY — DX: Unspecified atrial fibrillation: I48.91

## 2023-01-31 HISTORY — DX: Cardiac arrhythmia, unspecified: I49.9

## 2023-01-31 LAB — CBC WITH DIFFERENTIAL/PLATELET
Abs Immature Granulocytes: 0.01 10*3/uL (ref 0.00–0.07)
Basophils Absolute: 0 10*3/uL (ref 0.0–0.1)
Basophils Relative: 1 %
Eosinophils Absolute: 0.1 10*3/uL (ref 0.0–0.5)
Eosinophils Relative: 2 %
HCT: 26.7 % — ABNORMAL LOW (ref 36.0–46.0)
Hemoglobin: 8 g/dL — ABNORMAL LOW (ref 12.0–15.0)
Immature Granulocytes: 0 %
Lymphocytes Relative: 24 %
Lymphs Abs: 1 10*3/uL (ref 0.7–4.0)
MCH: 31.4 pg (ref 26.0–34.0)
MCHC: 30 g/dL (ref 30.0–36.0)
MCV: 104.7 fL — ABNORMAL HIGH (ref 80.0–100.0)
Monocytes Absolute: 0.5 10*3/uL (ref 0.1–1.0)
Monocytes Relative: 13 %
Neutro Abs: 2.5 10*3/uL (ref 1.7–7.7)
Neutrophils Relative %: 60 %
Platelets: 250 10*3/uL (ref 150–400)
RBC: 2.55 MIL/uL — ABNORMAL LOW (ref 3.87–5.11)
RDW: 15.9 % — ABNORMAL HIGH (ref 11.5–15.5)
WBC: 4.2 10*3/uL (ref 4.0–10.5)
nRBC: 0 % (ref 0.0–0.2)

## 2023-01-31 LAB — BASIC METABOLIC PANEL
Anion gap: 6 (ref 5–15)
BUN: 34 mg/dL — ABNORMAL HIGH (ref 8–23)
CO2: 21 mmol/L — ABNORMAL LOW (ref 22–32)
Calcium: 10.2 mg/dL (ref 8.9–10.3)
Chloride: 110 mmol/L (ref 98–111)
Creatinine, Ser: 1.28 mg/dL — ABNORMAL HIGH (ref 0.44–1.00)
GFR, Estimated: 42 mL/min — ABNORMAL LOW (ref >60)
Glucose, Bld: 106 mg/dL — ABNORMAL HIGH (ref 70–99)
Potassium: 4.4 mmol/L (ref 3.5–5.1)
Sodium: 137 mmol/L (ref 135–145)

## 2023-01-31 LAB — BPAM RBC: Blood Product Expiration Date: 202408072359

## 2023-01-31 LAB — TYPE AND SCREEN
Antibody Screen: POSITIVE
Unit division: 0

## 2023-02-01 ENCOUNTER — Ambulatory Visit (HOSPITAL_COMMUNITY)
Admission: RE | Admit: 2023-02-01 | Discharge: 2023-02-01 | Disposition: A | Discharge status: Home / Self Care | Payer: PPO | Attending: Internal Medicine | Admitting: Internal Medicine

## 2023-02-01 ENCOUNTER — Encounter (HOSPITAL_COMMUNITY): Payer: Self-pay

## 2023-02-01 ENCOUNTER — Ambulatory Visit (HOSPITAL_BASED_OUTPATIENT_CLINIC_OR_DEPARTMENT_OTHER): Payer: PPO | Admitting: Certified Registered Nurse Anesthetist

## 2023-02-01 ENCOUNTER — Encounter (HOSPITAL_COMMUNITY)
Admission: RE | Disposition: A | Discharge status: Home / Self Care | Payer: Self-pay | Source: Home / Self Care | Attending: Internal Medicine

## 2023-02-01 ENCOUNTER — Ambulatory Visit (HOSPITAL_COMMUNITY): Payer: PPO | Admitting: Certified Registered Nurse Anesthetist

## 2023-02-01 DIAGNOSIS — Z09 Encounter for follow-up examination after completed treatment for conditions other than malignant neoplasm: Secondary | ICD-10-CM | POA: Insufficient documentation

## 2023-02-01 DIAGNOSIS — Z7984 Long term (current) use of oral hypoglycemic drugs: Secondary | ICD-10-CM | POA: Diagnosis not present

## 2023-02-01 DIAGNOSIS — N1831 Chronic kidney disease, stage 3a: Secondary | ICD-10-CM | POA: Diagnosis not present

## 2023-02-01 DIAGNOSIS — I130 Hypertensive heart and chronic kidney disease with heart failure and stage 1 through stage 4 chronic kidney disease, or unspecified chronic kidney disease: Secondary | ICD-10-CM | POA: Diagnosis not present

## 2023-02-01 DIAGNOSIS — I509 Heart failure, unspecified: Secondary | ICD-10-CM | POA: Insufficient documentation

## 2023-02-01 DIAGNOSIS — K317 Polyp of stomach and duodenum: Secondary | ICD-10-CM | POA: Diagnosis not present

## 2023-02-01 DIAGNOSIS — Z7901 Long term (current) use of anticoagulants: Secondary | ICD-10-CM | POA: Diagnosis not present

## 2023-02-01 DIAGNOSIS — D509 Iron deficiency anemia, unspecified: Secondary | ICD-10-CM | POA: Diagnosis not present

## 2023-02-01 DIAGNOSIS — I110 Hypertensive heart disease with heart failure: Secondary | ICD-10-CM | POA: Diagnosis not present

## 2023-02-01 DIAGNOSIS — E119 Type 2 diabetes mellitus without complications: Secondary | ICD-10-CM | POA: Insufficient documentation

## 2023-02-01 DIAGNOSIS — I4891 Unspecified atrial fibrillation: Secondary | ICD-10-CM | POA: Insufficient documentation

## 2023-02-01 DIAGNOSIS — Z86711 Personal history of pulmonary embolism: Secondary | ICD-10-CM | POA: Diagnosis not present

## 2023-02-01 DIAGNOSIS — K449 Diaphragmatic hernia without obstruction or gangrene: Secondary | ICD-10-CM | POA: Insufficient documentation

## 2023-02-01 DIAGNOSIS — D500 Iron deficiency anemia secondary to blood loss (chronic): Secondary | ICD-10-CM

## 2023-02-01 DIAGNOSIS — K31811 Angiodysplasia of stomach and duodenum with bleeding: Secondary | ICD-10-CM | POA: Diagnosis not present

## 2023-02-01 DIAGNOSIS — K31819 Angiodysplasia of stomach and duodenum without bleeding: Secondary | ICD-10-CM

## 2023-02-01 DIAGNOSIS — K219 Gastro-esophageal reflux disease without esophagitis: Secondary | ICD-10-CM | POA: Insufficient documentation

## 2023-02-01 DIAGNOSIS — I5020 Unspecified systolic (congestive) heart failure: Secondary | ICD-10-CM

## 2023-02-01 DIAGNOSIS — K921 Melena: Secondary | ICD-10-CM

## 2023-02-01 DIAGNOSIS — Z86718 Personal history of other venous thrombosis and embolism: Secondary | ICD-10-CM | POA: Insufficient documentation

## 2023-02-01 DIAGNOSIS — E039 Hypothyroidism, unspecified: Secondary | ICD-10-CM | POA: Diagnosis not present

## 2023-02-01 DIAGNOSIS — Z79899 Other long term (current) drug therapy: Secondary | ICD-10-CM | POA: Diagnosis not present

## 2023-02-01 HISTORY — PX: POLYPECTOMY: SHX5525

## 2023-02-01 HISTORY — PX: ESOPHAGOGASTRODUODENOSCOPY (EGD) WITH PROPOFOL: SHX5813

## 2023-02-01 LAB — GLUCOSE, CAPILLARY: Glucose-Capillary: 98 mg/dL (ref 70–99)

## 2023-02-01 SURGERY — ESOPHAGOGASTRODUODENOSCOPY (EGD) WITH PROPOFOL
Anesthesia: General

## 2023-02-01 MED ORDER — LIDOCAINE HCL (PF) 2 % IJ SOLN
INTRAMUSCULAR | Status: AC
  Filled 2023-02-01: qty 5

## 2023-02-01 MED ORDER — PROPOFOL 10 MG/ML IV BOLUS
INTRAVENOUS | Status: DC | PRN
  Administered 2023-02-01: 80 mg via INTRAVENOUS

## 2023-02-01 MED ORDER — PROPOFOL 500 MG/50ML IV EMUL
INTRAVENOUS | Status: DC | PRN
  Administered 2023-02-01: 125 ug/kg/min via INTRAVENOUS

## 2023-02-01 MED ORDER — LACTATED RINGERS IV SOLN: INTRAVENOUS | Status: DC

## 2023-02-01 MED ORDER — LIDOCAINE HCL (PF) 2 % IJ SOLN
INTRAMUSCULAR | Status: DC | PRN
  Administered 2023-02-01: 50 mg via INTRADERMAL

## 2023-02-01 MED ORDER — PROPOFOL 500 MG/50ML IV EMUL
INTRAVENOUS | Status: AC
  Filled 2023-02-01: qty 50

## 2023-02-01 NOTE — Discharge Instructions (Addendum)
EGD Discharge instructions Please read the instructions outlined below and refer to this sheet in the next few weeks. These discharge instructions provide you with general information on caring for yourself after you leave the hospital. Your doctor may also give you specific instructions. While your treatment has been planned according to the most current medical practices available, unavoidable complications occasionally occur. If you have any problems or questions after discharge, please call your doctor. ACTIVITY You may resume your regular activity but move at a slower pace for the next 24 hours.  Take frequent rest periods for the next 24 hours.  Walking will help expel (get rid of) the air and reduce the bloated feeling in your abdomen.  No driving for 24 hours (because of the anesthesia (medicine) used during the test).  You may shower.  Do not sign any important legal documents or operate any machinery for 24 hours (because of the anesthesia used during the test).  NUTRITION Drink plenty of fluids.  You may resume your normal diet.  Begin with a light meal and progress to your normal diet.  Avoid alcoholic beverages for 24 hours or as instructed by your caregiver.  MEDICATIONS You may resume your normal medications unless your caregiver tells you otherwise.  WHAT YOU CAN EXPECT TODAY You may experience abdominal discomfort such as a feeling of fullness or "gas" pains.  FOLLOW-UP Your doctor will discuss the results of your test with you.  SEEK IMMEDIATE MEDICAL ATTENTION IF ANY OF THE FOLLOWING OCCUR: Excessive nausea (feeling sick to your stomach) and/or vomiting.  Severe abdominal pain and distention (swelling).  Trouble swallowing.  Temperature over 101 F (37.8 C).  Rectal bleeding or vomiting of blood.   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  I treated this again with coagulation today.  Hopefully this helps with your anemia.  You also  had 1 small polyp in your duodenum which I also removed.  Await pathology results, office will contact you.  Continue follow-up with hematology.  Follow-up in GI office in 3 months.   I hope you have a great rest of your week!  Hennie Duos. Marletta Lor, D.O. Gastroenterology and Hepatology [facility]

## 2023-02-01 NOTE — Interval H&P Note (Signed)
History and Physical Interval Note:  02/01/2023 8:10 AM  [name] [name]  has presented today for surgery, with the diagnosis of IDA,melena.  The various methods of treatment have been discussed with the patient and family. After consideration of risks, benefits and other options for treatment, the patient has consented to  Procedure(s) with comments: ESOPHAGOGASTRODUODENOSCOPY (EGD) WITH PROPOFOL (N/A) - 9:00 am, asa 3 as a surgical intervention.  The patient's history has been reviewed, patient examined, no change in status, stable for surgery.  I have reviewed the patient's chart and labs.  Questions were answered to the patient's satisfaction.     Lanelle Bal

## 2023-02-01 NOTE — Anesthesia Postprocedure Evaluation (Signed)
Anesthesia Post Note  Patient: [name] [name]  Procedure(s) Performed: ESOPHAGOGASTRODUODENOSCOPY (EGD) WITH PROPOFOL POLYPECTOMY  Patient location during evaluation: Phase II Anesthesia Type: General Level of consciousness: awake and alert and oriented Pain management: pain level controlled Vital Signs Assessment: post-procedure vital signs reviewed and stable Respiratory status: spontaneous breathing, nonlabored ventilation and respiratory function stable Cardiovascular status: blood pressure returned to baseline and stable Postop Assessment: no apparent nausea or vomiting Anesthetic complications: no  No notable events documented.   Last Vitals:  Vitals:   02/01/23 0731 02/01/23 0841  BP: (!) 180/82 (!) 154/72  Pulse: 73 (!) 55  Resp: (!) 21 (!) 30  Temp: 37 C 36.8 C  SpO2: 100% 95%    Last Pain:  Vitals:   02/01/23 0841  TempSrc: Oral  PainSc: 0-No pain                 Mabelle Mungin C Mykaylah Ballman

## 2023-02-01 NOTE — Op Note (Signed)
[facility] Patient Name: [name] [name] Procedure Date: 02/01/2023 8:15 AM MRN: [id number] Date of Birth: 12/17/1940 Attending MD: Hennie Duos. Marletta Lor , [place], [id number] CSN: [id number] Age: 82 Admit Type: Outpatient Procedure:                Upper GI endoscopy Indications:              Iron deficiency anemia secondary to chronic blood                            loss Providers:                Hennie Duos. Marletta Lor, DO, Tammy Vaught, RN, Zena Amos Referring MD:              Medicines:                See the Anesthesia note for documentation of the                            administered medications Complications:            No immediate complications. Estimated Blood Loss:     Estimated blood loss was minimal. Procedure:                Pre-Anesthesia Assessment:                           - The anesthesia plan was to use monitored                            anesthesia care (MAC).                           After obtaining informed consent, the endoscope was                            passed under direct vision. Throughout the                            procedure, the patient's blood pressure, pulse, and                            oxygen saturations were monitored continuously. The                            [id number]-H190 ([id number]) scope was introduced through the                            mouth, and advanced to the second part of duodenum.                            The upper GI endoscopy was accomplished without                            difficulty. The patient tolerated the procedure  well. Scope In: 8:24:25 AM Scope Out: 8:34:52 AM Total Procedure Duration: 0 hours 10 minutes 27 seconds  Findings:      A small hiatal hernia was present.      The Z-line was regular.      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 Coagulation used to "seal" lesion for bleeding        prevention using argon plasma was successful.      A single 5 mm sessile polyp was found in the duodenal bulb. The polyp       was removed with a cold snare. Resection and retrieval were complete. Impression:               - Small hiatal hernia.                           - Z-line regular.                           - Gastric antral vascular ectasia without bleeding.                            Treated with argon plasma coagulation (APC).                           - A single duodenal polyp. Resected and retrieved. Moderate Sedation:      Per Anesthesia Care Recommendation:           - Patient has a contact number available for                            emergencies. The signs and symptoms of potential                            delayed complications were discussed with the                            patient. Return to normal activities tomorrow.                            Written discharge instructions were provided to the                            patient.                           - Resume previous diet.                           - Continue present medications.                           - Repeat upper endoscopy PRN for retreatment.                           - Return to GI clinic in 3 months.                           -  Hold Eliquis x2 more days Procedure Code(s):        --- Professional ---                           [phone], 59, Esophagogastroduodenoscopy, flexible,                            transoral; with control of bleeding, any method                           43251, Esophagogastroduodenoscopy, flexible,                            transoral; with removal of tumor(s), polyp(s), or                            other lesion(s) by snare technique Diagnosis Code(s):        --- Professional ---                           K44.9, Diaphragmatic hernia without obstruction or                            gangrene                           K31.819, Angiodysplasia of stomach and duodenum                             without bleeding                           K31.7, Polyp of stomach and duodenum                           D50.0, Iron deficiency anemia secondary to blood                            loss (chronic) CPT copyright 2022 [employer] Association. All rights reserved. The codes documented in this report are preliminary and upon coder review may  be revised to meet current compliance requirements. Hennie Duos. Marletta Lor, DO Hennie Duos. Marletta Lor, DO 02/01/2023 8:46:47 AM This report has been signed electronically. Number of Addenda: 0

## 2023-02-01 NOTE — Anesthesia Preprocedure Evaluation (Addendum)
Anesthesia Evaluation  Patient identified by MRN, date of birth, ID band Patient awake    Reviewed: Allergy & Precautions, H&P , NPO status , Patient's Chart, lab work & pertinent test results  History of Anesthesia Complications (+) history of anesthetic complications (severe burning sensation in forearm with propofol)  Airway Mallampati: II  TM Distance: >3 FB Neck ROM: Full    Dental  (+) Edentulous Upper, Edentulous Lower   Pulmonary shortness of breath and with exertion, PE   Pulmonary exam normal breath sounds clear to auscultation       Cardiovascular Exercise Tolerance: Good hypertension, Pt. on medications +CHF and + DOE  + dysrhythmias Atrial Fibrillation + Valvular Problems/Murmurs (Nonrheumatic mitral valve regurgitation Nonrheumatic tricuspid valve regurgitation)  Rhythm:Irregular Rate:Normal  1. Left ventricular ejection fraction, by estimation, is 50 to 55%. Left  ventricular ejection fraction by 3D volume is 53 %. The left ventricle has  low normal function. Left ventricular endocardial border not optimally  defined to evaluate regional wall  motion. There is moderate left ventricular hypertrophy. Left ventricular  diastolic parameters are consistent with Grade I diastolic dysfunction  (impaired relaxation).   2. Right ventricular systolic function is normal. The right ventricular  size is mildly enlarged. There is normal pulmonary artery systolic  pressure. The estimated right ventricular systolic pressure is 30.7 mmHg.   3. Left atrial size was severely dilated.   4. Right atrial size was mildly dilated.   5. The mitral valve is degenerative. Mild mitral valve regurgitation. No  evidence of mitral stenosis. Moderate mitral annular calcification.   6. The aortic valve is grossly normal. There is mild calcification of the  aortic valve. Aortic valve regurgitation is not visualized. No aortic  stenosis is  present.   7. The inferior vena cava is normal in size with greater than 50%  respiratory variability, suggesting right atrial pressure of 3 mmHg.     Neuro/Psych  Headaches  Neuromuscular disease  negative psych ROS   GI/Hepatic Neg liver ROS,GERD  Medicated and Controlled,,  Endo/Other  diabetes, Well Controlled, Type 2, Oral Hypoglycemic AgentsHypothyroidism    Renal/GU Renal disease  negative genitourinary   Musculoskeletal  (+) Arthritis , Osteoarthritis,    Abdominal   Peds negative pediatric ROS (+)  Hematology  (+) Blood dyscrasia, anemia   Anesthesia Other Findings   Reproductive/Obstetrics negative OB ROS                             Anesthesia Physical Anesthesia Plan  ASA: 3  Anesthesia Plan: General   Post-op Pain Management: Minimal or no pain anticipated   Induction: Intravenous  PONV Risk Score and Plan: 0 and Propofol infusion  Airway Management Planned: Nasal Cannula and Natural Airway  Additional Equipment:   Intra-op Plan:   Post-operative Plan:   Informed Consent: I have reviewed the patients History and Physical, chart, labs and discussed the procedure including the risks, benefits and alternatives for the proposed anesthesia with the patient or authorized representative who has indicated his/her understanding and acceptance.       Plan Discussed with: CRNA and Surgeon  Anesthesia Plan Comments:         Anesthesia Quick Evaluation

## 2023-02-01 NOTE — Transfer of Care (Signed)
Immediate Anesthesia Transfer of Care Note  Patient: [name] [name]  Procedure(s) Performed: ESOPHAGOGASTRODUODENOSCOPY (EGD) WITH PROPOFOL POLYPECTOMY  Patient Location: Short Stay  Anesthesia Type:General  Level of Consciousness: awake, alert , and oriented  Airway & Oxygen Therapy: Patient Spontanous Breathing  Post-op Assessment: Report given to RN, Post -op Vital signs reviewed and stable, Patient moving all extremities X 4, and Patient able to stick tongue midline  Post vital signs: Reviewed  Last Vitals:  Vitals Value Taken Time  BP 154/72   Temp 98.3   Pulse 65   Resp 28   SpO2 96     Last Pain:  Vitals:   02/01/23 0731  TempSrc: Oral  PainSc: 0-No pain         Complications: No notable events documented.

## 2023-02-02 LAB — SURGICAL PATHOLOGY

## 2023-02-07 ENCOUNTER — Encounter (HOSPITAL_COMMUNITY): Payer: Self-pay | Admitting: Internal Medicine

## 2023-02-09 ENCOUNTER — Inpatient Hospital Stay: Payer: PPO

## 2023-02-09 DIAGNOSIS — D500 Iron deficiency anemia secondary to blood loss (chronic): Secondary | ICD-10-CM | POA: Diagnosis not present

## 2023-02-09 LAB — CBC
HCT: 31 % — ABNORMAL LOW (ref 36.0–46.0)
Hemoglobin: 9.1 g/dL — ABNORMAL LOW (ref 12.0–15.0)
MCH: 29.4 pg (ref 26.0–34.0)
MCHC: 29.4 g/dL — ABNORMAL LOW (ref 30.0–36.0)
MCV: 100 fL (ref 80.0–100.0)
Platelets: 258 10*3/uL (ref 150–400)
RBC: 3.1 MIL/uL — ABNORMAL LOW (ref 3.87–5.11)
RDW: 14.3 % (ref 11.5–15.5)
WBC: 3.8 10*3/uL — ABNORMAL LOW (ref 4.0–10.5)
nRBC: 0 % (ref 0.0–0.2)

## 2023-02-09 LAB — SAMPLE TO BLOOD BANK

## 2023-02-23 ENCOUNTER — Inpatient Hospital Stay (HOSPITAL_BASED_OUTPATIENT_CLINIC_OR_DEPARTMENT_OTHER): Payer: PPO | Admitting: Oncology

## 2023-02-23 ENCOUNTER — Inpatient Hospital Stay: Payer: PPO | Attending: Physician Assistant

## 2023-02-23 VITALS — BP 172/62 | HR 42 | Temp 97.8°F | Resp 16 | Wt 200.4 lb

## 2023-02-23 DIAGNOSIS — Z79890 Hormone replacement therapy (postmenopausal): Secondary | ICD-10-CM | POA: Insufficient documentation

## 2023-02-23 DIAGNOSIS — K449 Diaphragmatic hernia without obstruction or gangrene: Secondary | ICD-10-CM | POA: Insufficient documentation

## 2023-02-23 DIAGNOSIS — K31819 Angiodysplasia of stomach and duodenum without bleeding: Secondary | ICD-10-CM | POA: Insufficient documentation

## 2023-02-23 DIAGNOSIS — K219 Gastro-esophageal reflux disease without esophagitis: Secondary | ICD-10-CM | POA: Diagnosis not present

## 2023-02-23 DIAGNOSIS — Z7901 Long term (current) use of anticoagulants: Secondary | ICD-10-CM | POA: Diagnosis not present

## 2023-02-23 DIAGNOSIS — Z87442 Personal history of urinary calculi: Secondary | ICD-10-CM | POA: Diagnosis not present

## 2023-02-23 DIAGNOSIS — E1122 Type 2 diabetes mellitus with diabetic chronic kidney disease: Secondary | ICD-10-CM | POA: Insufficient documentation

## 2023-02-23 DIAGNOSIS — I4891 Unspecified atrial fibrillation: Secondary | ICD-10-CM | POA: Insufficient documentation

## 2023-02-23 DIAGNOSIS — Z86718 Personal history of other venous thrombosis and embolism: Secondary | ICD-10-CM | POA: Diagnosis not present

## 2023-02-23 DIAGNOSIS — Z79899 Other long term (current) drug therapy: Secondary | ICD-10-CM | POA: Insufficient documentation

## 2023-02-23 DIAGNOSIS — D500 Iron deficiency anemia secondary to blood loss (chronic): Secondary | ICD-10-CM | POA: Insufficient documentation

## 2023-02-23 DIAGNOSIS — Z800 Family history of malignant neoplasm of digestive organs: Secondary | ICD-10-CM | POA: Insufficient documentation

## 2023-02-23 DIAGNOSIS — K5900 Constipation, unspecified: Secondary | ICD-10-CM | POA: Diagnosis not present

## 2023-02-23 DIAGNOSIS — I129 Hypertensive chronic kidney disease with stage 1 through stage 4 chronic kidney disease, or unspecified chronic kidney disease: Secondary | ICD-10-CM | POA: Diagnosis not present

## 2023-02-23 DIAGNOSIS — Z85828 Personal history of other malignant neoplasm of skin: Secondary | ICD-10-CM | POA: Diagnosis not present

## 2023-02-23 DIAGNOSIS — E039 Hypothyroidism, unspecified: Secondary | ICD-10-CM | POA: Insufficient documentation

## 2023-02-23 DIAGNOSIS — Z86711 Personal history of pulmonary embolism: Secondary | ICD-10-CM | POA: Insufficient documentation

## 2023-02-23 DIAGNOSIS — Z7984 Long term (current) use of oral hypoglycemic drugs: Secondary | ICD-10-CM | POA: Diagnosis not present

## 2023-02-23 DIAGNOSIS — I2699 Other pulmonary embolism without acute cor pulmonale: Secondary | ICD-10-CM | POA: Diagnosis not present

## 2023-02-23 LAB — CBC WITH DIFFERENTIAL/PLATELET
Abs Immature Granulocytes: 0.02 10*3/uL (ref 0.00–0.07)
Basophils Absolute: 0 10*3/uL (ref 0.0–0.1)
Basophils Relative: 1 %
Eosinophils Absolute: 0.1 10*3/uL (ref 0.0–0.5)
Eosinophils Relative: 3 %
HCT: 31.6 % — ABNORMAL LOW (ref 36.0–46.0)
Hemoglobin: 9.2 g/dL — ABNORMAL LOW (ref 12.0–15.0)
Immature Granulocytes: 1 %
Lymphocytes Relative: 31 %
Lymphs Abs: 1.1 10*3/uL (ref 0.7–4.0)
MCH: 28.8 pg (ref 26.0–34.0)
MCHC: 29.1 g/dL — ABNORMAL LOW (ref 30.0–36.0)
MCV: 99.1 fL (ref 80.0–100.0)
Monocytes Absolute: 0.5 10*3/uL (ref 0.1–1.0)
Monocytes Relative: 15 %
Neutro Abs: 1.7 10*3/uL (ref 1.7–7.7)
Neutrophils Relative %: 49 %
Platelets: 225 10*3/uL (ref 150–400)
RBC: 3.19 MIL/uL — ABNORMAL LOW (ref 3.87–5.11)
RDW: 13.8 % (ref 11.5–15.5)
WBC: 3.4 10*3/uL — ABNORMAL LOW (ref 4.0–10.5)
nRBC: 0 % (ref 0.0–0.2)

## 2023-02-23 LAB — FERRITIN: Ferritin: 24 ng/mL (ref 11–307)

## 2023-02-23 LAB — IRON AND TIBC
Iron: 24 ug/dL — ABNORMAL LOW (ref 28–170)
Saturation Ratios: 7 % — ABNORMAL LOW (ref 10.4–31.8)
TIBC: 348 ug/dL (ref 250–450)
UIBC: 324 ug/dL

## 2023-02-23 NOTE — Progress Notes (Signed)
[facility] [address], [place] [address]   CLINIC:  Medical Oncology/Hematology  PCP:  Gabriel Earing, FNP [address] [place] [address] [phone]   REASON FOR VISIT:  Follow-up for iron deficiency anemia   PRIOR THERAPY: Blood transfusions and oral iron tablets   CURRENT THERAPY: Intermittent IV iron  INTERVAL HISTORY:   [name] [name] 82 y.o. female returns for routine follow-up of her iron deficiency anemia.  She was last seen by Rojelio Brenner PA-C on 01/13/23.  She was hospitalized on 01/27/23 for hemoglobin of 6.7 and given a unit of blood in ED. She had an EGD on 02/01/23 which showed a small hiatal hernia, gastric antral vascular ectasia without bleeding that was treated with argon plasma coagulation (ACP) and a single duodenal polyp that was resected and retrieved. Eliquis was held for several days.   She received feraheme intermittently last on 12/23/22, 12/30/22 and 01/16/23.   At today's visit, she reports feeling well. Taking iron supplements. No changes in stool. Energy is fair. Appetite is great.  She feels like she is slowly starting to get back to her baseline.  Has intermittent constipation and headaches.  [place].  She denies any pica, headaches, lightheadedness, syncope, chest pain.   She is taking iron tablet once daily with mild constipation.  She remains on Eliquis 5 mg BID for history of unprovoked DVT and PE in 2020.     ASSESSMENT & PLAN:  1.  Iron deficiency anemia secondary to GI blood loss - Normocytic anemia secondary to chronic blood loss, may also have some anemia related to CKD stage IIIb - Hematology work-up (02/24/2022):  Ferritin 11, iron saturation 7%.  Normal folate, B12, MMA, copper. Reticulocytes 3.9% (consistent with acute blood loss), LDH normal, haptoglobin normal, DAT negative SPEP negative for monoclonal protein, but showed elevation in acute phase proteins - She has required  intermittent blood transfusions, most recently on 02/24/2022 (Hgb 6.7) and again on 03/03/2022 (Hgb 6.6).  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  Normal duodenum and jejunum. - Colonoscopy (12/08/2019): Diverticulosis and polyps -Had EGD on 02/01/23 which showed small hiatal hernia, gastric antral vascular ectasia without bleeding that was treated with argon plasma coagulation (ACP) and a single duodenal polyp that was resected and retrieved - She denies any overt melena - She is on Eliquis 5 mg twice daily.  She takes iron tablet once daily.  - Most recent IV Feraheme on 12/23/22, 12/30/22 and 01/16/23 -She received a blood transfusion in ED on 01/27/23 for hemoglobin of 6.7.  - Labs today (02/23/23): Hgb 9.2/MCV 99.4, ferritin 24, iron saturation 7% - PLAN: Recommend additional IV iron with Feraheme x2.  No blood transfusion today. - Continue iron tablet daily. - Repeat CBC and iron panel with RTC in 2 months with phone visit - Continue GI follow-up.   2.   Unprovoked right leg DVT and PE: - Doppler on 04/03/2019: DVT in the femoral vein and calf veins. - CT chest PE protocol (04/03/2019): Moderate size pulmonary emboli in the right lower lobe. - She is on Eliquis 5 mg twice daily, also for Afib   3.  Social/family history: - She lives at home with her older son.  She takes care of the garden and does laundry and her ADLs and IADLs. She was a retired tobacco former.  Never smoked. - Sister had anemia.  Mother had uterine  cancer and sister had colon cancer.    PLAN SUMMARY: >> IV Feraheme x 2 >> Labs in 2 months = CBC/D, ferritin, iron/TIBC, BMP >> PHONE visit in 2 months (1 week after labs)     REVIEW OF SYSTEMS:   Review of Systems  Constitutional:  Positive for fatigue.  Respiratory:  Positive for shortness of breath.   Gastrointestinal:  Positive for constipation.  Neurological:  Positive for headaches.     PHYSICAL EXAM:  ECOG PERFORMANCE STATUS: 2 -  Symptomatic, <50% confined to bed  There were no vitals filed for this visit. There were no vitals filed for this visit. Physical Exam Constitutional:      Appearance: Normal appearance.  Cardiovascular:     Rate and Rhythm: Normal rate and regular rhythm.  Pulmonary:     Effort: Pulmonary effort is normal.     Breath sounds: Normal breath sounds.  Abdominal:     General: Bowel sounds are normal.     Palpations: Abdomen is soft.  Musculoskeletal:        General: No swelling. Normal range of motion.  Neurological:     Mental Status: She is alert and oriented to person, place, and time. Mental status is at baseline.     PAST MEDICAL/SURGICAL HISTORY:  Past Medical History:  Diagnosis Date   Anemia    Arthritis    Ostearthritis- hips, knees, fingers   Atrial fibrillation (HCC)    Basal cell carcinoma (BCC) of right hand 10/2021   Dx by [facility]   DVT (deep venous thrombosis) (HCC)    Dyspnea    Dysrhythmia    GERD (gastroesophageal reflux disease)    Headache(784.0)    tx. Valproic acid   History of diabetes mellitus    History of kidney stones    Hypertension    Hypothyroidism    Pulmonary embolism [facility])    Past Surgical History:  Procedure Laterality Date   ABDOMINAL HYSTERECTOMY     BILATERAL HIP ARTHROSCOPY Left    BIOPSY  12/08/2019   Procedure: BIOPSY;  Surgeon: Corbin Ade, MD;  [facility];  Service: Endoscopy;;   CATARACT EXTRACTION, BILATERAL     COLONOSCOPY N/A 12/08/2019   polyps (tubular adenoma), diverticulosis, colonic lipoma, no surveillance due to age   CYSTOSCOPY W/ URETERAL STENT PLACEMENT Right 12/25/2020   Procedure: CYSTOSCOPY WITH RETROGRADE PYELOGRAM/URETERAL STENT PLACEMENT;  Surgeon: Jannifer Hick, MD;  Location: [facility];  Service: Urology;  Laterality: Right;   CYSTOSCOPY/URETEROSCOPY/HOLMIUM LASER/STENT PLACEMENT Right 01/15/2021   Procedure: CYSTOSCOPY/RETROGRADE/URETEROSCOPY/HOLMIUM LASER/STENT EXCHANGE;  Surgeon:  Jannifer Hick, MD;  Location: [facility];  Service: Urology;  Laterality: Right;   ESOPHAGOGASTRODUODENOSCOPY N/A 12/08/2019   normal esophagus with 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   ESOPHAGOGASTRODUODENOSCOPY (EGD) WITH PROPOFOL N/A 05/18/2021   Surgeon: Vida Rigger, MD;   Tiny hiatal hernia, gastric antral vascular ectasia s/p ablation, normal duodenum, normal jejunum.   ESOPHAGOGASTRODUODENOSCOPY (EGD) WITH PROPOFOL N/A 03/10/2022   Procedure: ESOPHAGOGASTRODUODENOSCOPY (EGD) WITH PROPOFOL;  Surgeon: Lanelle Bal, DO;  [facility];  Service: Endoscopy;  Laterality: N/A;  10:45am   ESOPHAGOGASTRODUODENOSCOPY (EGD) WITH PROPOFOL N/A 02/01/2023   Procedure: ESOPHAGOGASTRODUODENOSCOPY (EGD) WITH PROPOFOL;  Surgeon: Lanelle Bal, DO;  [facility];  Service: Endoscopy;  Laterality: N/A;  9:00 am, asa 3   GIVENS CAPSULE STUDY N/A 01/13/2020   Procedure: GIVENS CAPSULE STUDY;  Surgeon: Corbin Ade, MD;  [facility];  Service: Endoscopy;  Laterality: N/A;  7:30am   GIVENS CAPSULE STUDY N/A 05/04/2021   Procedure: GIVENS CAPSULE STUDY;  Surgeon: Vida Rigger, MD;  [facility];  Service: Endoscopy;  Laterality: N/A;   MULTIPLE TOOTH EXTRACTIONS     60's   PARATHYROIDECTOMY     POLYPECTOMY  12/08/2019   Procedure: POLYPECTOMY;  Surgeon: Corbin Ade, MD;  [facility];  Service: Endoscopy;;   POLYPECTOMY  02/01/2023   Procedure: POLYPECTOMY;  Surgeon: Lanelle Bal, DO;  [facility];  Service: Endoscopy;;   RADIOFREQUENCY ABLATION  05/18/2021   Procedure: RADIO FREQUENCY ABLATION;  Surgeon: Vida Rigger, MD;  [facility];  Service: Endoscopy;;   TEE WITHOUT CARDIOVERSION N/A 07/06/2021   Procedure: TRANSESOPHAGEAL ECHOCARDIOGRAM (TEE);  Surgeon: Little Ishikawa, MD;  Location: [facility] ENDOSCOPY;  Service: Cardiovascular;  Laterality: N/A;   TOTAL HIP ARTHROPLASTY  Right 10/29/2013   Procedure: RIGHT TOTAL HIP ARTHROPLASTY ANTERIOR APPROACH;  Surgeon: Shelda Pal, MD;  Location: [facility];  Service: Orthopedics;  Laterality: Right;    SOCIAL HISTORY:  Social History   Socioeconomic History   Marital status: Widowed    Spouse name: Not on file   Number of children: 4   Years of education: 29   Highest education level: 12th grade  Occupational History   Occupation: Retired    Comment: Tobacco Farming  Tobacco Use   Smoking status: Never   Smokeless tobacco: Never  Vaping Use   Vaping status: Never Used  Substance and Sexual Activity   Alcohol use: No    Alcohol/week: 0.0 standard drinks of alcohol   Drug use: No   Sexual activity: Not Currently  Other Topics Concern   Not on file  Social History Narrative   Patient is widowed and lives in a one story home. She has four adult children and one son lives with her.       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   Social Determinants of Health   Financial Resource Strain: Low Risk  (12/27/2022)   Overall Financial Resource Strain (CARDIA)    Difficulty of Paying Living Expenses: Not hard at all  Food Insecurity: No Food Insecurity (12/27/2022)   Hunger Vital Sign    Worried About Running Out of Food in the Last Year: Never true    Ran Out of Food in the Last Year: Never true  Transportation Needs: No Transportation Needs (12/27/2022)   PRAPARE - Administrator, Civil Service (Medical): No    Lack of Transportation (Non-Medical): No  Physical Activity: Insufficiently Active (12/27/2022)   Exercise Vital Sign    Days of Exercise per Week: 3 days    Minutes of Exercise per Session: 30 min  Stress: No Stress Concern Present (12/27/2022)   [employer] of Occupational Health - Occupational Stress Questionnaire    Feeling of Stress : Not at all  Social Connections: Moderately Isolated (12/27/2022)   Social Connection and Isolation Panel [NHANES]    Frequency of Communication with Friends  and Family: More than three times a week    Frequency of Social Gatherings with Friends and Family: More than three times a week    Attends Religious Services: More than 4 times per year    Active Member of [employer] or Organizations: No    Attends Banker Meetings: Never    Marital Status: Widowed  Intimate Partner Violence: Not At Risk (12/27/2022)   Humiliation, Afraid,  Rape, and Kick questionnaire    Fear of Current or Ex-Partner: No    Emotionally Abused: No    Physically Abused: No    Sexually Abused: No    FAMILY HISTORY:  Family History  Problem Relation Age of Onset   Colitis Sister 69       alive   Cancer Sister 5       colon   Cancer Mother 42       uterine   Heart disease Father 75       heart failure   Heart attack Brother 33   Healthy Daughter    Healthy Son    Pulmonary embolism Brother 46   Heart disease Brother    Healthy Son    Healthy Son     CURRENT MEDICATIONS:  Outpatient Encounter Medications as of 02/23/2023  Medication Sig   acetaminophen (TYLENOL) 325 MG tablet Take 2 tablets (650 mg total) by mouth every 6 (six) hours as needed for mild pain or headache (or Fever >/= 101).   allopurinol (ZYLOPRIM) 100 MG tablet Take 1 tablet (100 mg total) by mouth daily.   amLODipine (NORVASC) 2.5 MG tablet Take 1 tablet (2.5 mg total) by mouth daily.   apixaban (ELIQUIS) 2.5 MG TABS tablet Take 1 tablet (2.5 mg total) by mouth 2 (two) times daily.   atorvastatin (LIPITOR) 20 MG tablet Take 1 tablet by mouth once daily   dapagliflozin propanediol (FARXIGA) 10 MG TABS tablet Take 1 tablet (10 mg total) by mouth daily.   Ferrous Sulfate (IRON PO) Take 1 tablet by mouth daily.   furosemide (LASIX) 20 MG tablet Take 1 tablet by mouth once daily   GNP CRANBERRY EXTRACT PO Take 500 mg by mouth in the morning.   levothyroxine (SYNTHROID) 50 MCG tablet TAKE 1 TABLET BY MOUTH ONCE DAILY BEFORE BREAKFAST   loratadine (CLARITIN) 10 MG tablet Take 10 mg by mouth  daily.   losartan (COZAAR) 25 MG tablet Take 1 tablet (25 mg total) by mouth daily.   oxybutynin (DITROPAN-XL) 10 MG 24 hr tablet Take 1 tablet (10 mg total) by mouth at bedtime.   pantoprazole (PROTONIX) 40 MG tablet Take 1 tablet (40 mg total) by mouth 2 (two) times daily before a meal.   vitamin B-12 (CYANOCOBALAMIN) 50 MCG tablet Take 50 mcg by mouth daily.   No facility-administered encounter medications on file as of 02/23/2023.    ALLERGIES:  No Known Allergies  LABORATORY DATA:  I have reviewed the labs as listed.  CBC    Component Value Date/Time   WBC 3.8 (L) 02/09/2023 1028   RBC 3.10 (L) 02/09/2023 1028   HGB 9.1 (L) 02/09/2023 1028   HGB 9.4 (L) 12/02/2022 0957   HCT 31.0 (L) 02/09/2023 1028   HCT 29.6 (L) 12/02/2022 0957   PLT 258 02/09/2023 1028   PLT 206 12/02/2022 0957   MCV 100.0 02/09/2023 1028   MCV 99 (H) 12/02/2022 0957   MCH 29.4 02/09/2023 1028   MCHC 29.4 (L) 02/09/2023 1028   RDW 14.3 02/09/2023 1028   RDW 12.7 12/02/2022 0957   LYMPHSABS 1.0 01/31/2023 1426   LYMPHSABS 0.9 12/02/2022 0957   MONOABS 0.5 01/31/2023 1426   EOSABS 0.1 01/31/2023 1426   EOSABS 0.1 12/02/2022 0957   BASOSABS 0.0 01/31/2023 1426   BASOSABS 0.0 12/02/2022 0957      Latest Ref Rng & Units 01/31/2023    2:26 PM 01/27/2023   11:04 PM 01/27/2023   10:56  AM  CMP  Glucose 70 - 99 mg/dL 329  518  841   BUN 8 - 23 mg/dL 34  36  44   Creatinine 0.44 - 1.00 mg/dL 6.60  6.30  1.60   Sodium 135 - 145 mmol/L 137  143  137   Potassium 3.5 - 5.1 mmol/L 4.4  3.7  4.3   Chloride 98 - 111 mmol/L 110  112  110   CO2 22 - 32 mmol/L 21   22   Calcium 8.9 - 10.3 mg/dL 10.9   32.3     DIAGNOSTIC IMAGING:  I have independently reviewed the relevant imaging and discussed with the patient.   WRAP UP:  All questions were answered. The patient knows to call the clinic with any problems, questions or concerns.  Medical decision making: Moderate  Time spent on visit: I spent 20 minutes  dedicated to the care of this patient (face-to-face and non-face-to-face) on the date of the encounter to include what is described in the assessment and plan.   Mauro Kaufmann, NP  09/27/2022 11:03 AM

## 2023-03-03 ENCOUNTER — Inpatient Hospital Stay: Payer: PPO

## 2023-03-03 VITALS — BP 121/53 | HR 53 | Temp 97.4°F | Resp 18

## 2023-03-03 DIAGNOSIS — D500 Iron deficiency anemia secondary to blood loss (chronic): Secondary | ICD-10-CM

## 2023-03-03 MED ORDER — SODIUM CHLORIDE 0.9 % IV SOLN
510.0000 mg | Freq: Once | INTRAVENOUS | Status: AC
  Administered 2023-03-03: 510 mg via INTRAVENOUS
  Filled 2023-03-03: qty 510

## 2023-03-03 MED ORDER — SODIUM CHLORIDE 0.9 % IV SOLN: Freq: Once | INTRAVENOUS | Status: AC

## 2023-03-03 NOTE — Progress Notes (Signed)
Patient presents today for Feraheme infusion per providers order.  Vital signs WNL.  Peripheral IV started and blood return noted pre and post infusion.  Stable during infusion without adverse affects.  Vital signs stable.  No complaints at this time.  Discharge from clinic ambulatory in stable condition.  Alert and oriented X 3.  Follow up with [facility] as scheduled.

## 2023-03-03 NOTE — Patient Instructions (Signed)
[facility]  Discharge Instructions: Thank you for choosing [facility] to provide your oncology and hematology care.  If you have a lab appointment with the Cancer Center - please note that after April 8th, 2024, all labs will be drawn in the cancer center.  You do not have to check in or register with the main entrance as you have in the past but will complete your check-in in the cancer center.  Wear comfortable clothing and clothing appropriate for easy access to any Portacath or PICC line.   We strive to give you quality time with your provider. You may need to reschedule your appointment if you arrive late (15 or more minutes).  Arriving late affects you and other patients whose appointments are after yours.  Also, if you miss three or more appointments without notifying the office, you may be dismissed from the clinic at the provider's discretion.      For prescription refill requests, have your pharmacy contact our office and allow 72 hours for refills to be completed.    Today you received the following chemotherapy and/or immunotherapy agents Feraheme      To help prevent nausea and vomiting after your treatment, we encourage you to take your nausea medication as directed.  BELOW ARE SYMPTOMS THAT SHOULD BE REPORTED IMMEDIATELY: *FEVER GREATER THAN 100.4 F (38 C) OR HIGHER *CHILLS OR SWEATING *NAUSEA AND VOMITING THAT IS NOT CONTROLLED WITH YOUR NAUSEA MEDICATION *UNUSUAL SHORTNESS OF BREATH *UNUSUAL BRUISING OR BLEEDING *URINARY PROBLEMS (pain or burning when urinating, or frequent urination) *BOWEL PROBLEMS (unusual diarrhea, constipation, pain near the anus) TENDERNESS IN MOUTH AND THROAT WITH OR WITHOUT PRESENCE OF ULCERS (sore throat, sores in mouth, or a toothache) UNUSUAL RASH, SWELLING OR PAIN  UNUSUAL VAGINAL DISCHARGE OR ITCHING   Items with * indicate a potential emergency and should be followed up as soon as possible or go to the  Emergency Department if any problems should occur.  Please show the CHEMOTHERAPY ALERT CARD or IMMUNOTHERAPY ALERT CARD at check-in to the Emergency Department and triage nurse.  Should you have questions after your visit or need to cancel or reschedule your appointment, please contact [facility] [phone]  and follow the prompts.  Office hours are 8:00 a.m. to 4:30 p.m. Monday - Friday. Please note that voicemails left after 4:00 p.m. may not be returned until the following business day.  We are closed weekends and major holidays. You have access to a nurse at all times for urgent questions. Please call the main number to the clinic [phone] and follow the prompts.  For any non-urgent questions, you may also contact your provider using MyChart. We now offer e-Visits for anyone 18 and older to request care online for non-urgent symptoms. For details visit [email or web address].   Also download the MyChart app! Go to the app store, search "MyChart", open the app, select [facility], and log in with your MyChart username and password.   

## 2023-03-07 DIAGNOSIS — I5042 Chronic combined systolic (congestive) and diastolic (congestive) heart failure: Secondary | ICD-10-CM | POA: Insufficient documentation

## 2023-03-07 DIAGNOSIS — I5022 Chronic systolic (congestive) heart failure: Secondary | ICD-10-CM | POA: Insufficient documentation

## 2023-03-07 NOTE — Progress Notes (Unsigned)
Cardiology Office Note:   Date:  03/08/2023  ID:  [name] [name], [name] [name] 02-05-1941, MRN [id number] PCP: Gabriel Earing, FNP   HeartCare Providers Cardiologist:  Rollene Rotunda, MD Cardiology APP:  Jodelle Gross, NP {  History of Present Illness:   [name] [name] is a 82 y.o. female  who presents for management of chronic systolic CHF and persistent atrial fibrillation with CHADS  VASC Score of 6.   Echocardiogram on 02/07/21 revealed 35% to 40%, moderate elevated PASP with estimated RVSP 51.5 mmHg, severely dilated LA, moderate dilated RA, severe MR moderate TR, mild aortic sclerosis.  She did have a repeat echocardiogram which was completed on 04/01/2021.  She was found to have a normal ejection fraction of 55 to 60% with normal LV function.  It was however found that she had severe mitral valve regurgitation with no evidence of mitral valve stenosis, moderate mitral annular calcification.  PI SA ER O0 0.44 cm, MR radius 1.1 cm and MR regurgitant volume of 92 cc.  She was recommended for TEE for further assessment of mitral regurgitation.   She had this in Dec 2022 and her MR was thought to be moderate and this was reviewed by the structural team and intervention was not suggested.  The most recent echocardiogram in September of last year demonstrated mild mitral regurgitation.  Since I last saw her she gets around slowly because of knee problems.  She uses a cane.  She had to have transfusions again because of bleeding and she has had procedures to coagulate vascular ectasia.  The patient denies any new symptoms such as chest discomfort, neck or arm discomfort. There has been no new shortness of breath, PND or orthopnea. There have been no reported palpitations, presyncope or syncope.  Of note since I saw her she has had her dose of Eliquis reduced because of recurrent bleeding.    ROS: As stated in the HPI and negative for all other systems.  Studies Reviewed:    EKG:    EKG Interpretation Date/Time:  Wednesday March 08 2023 13:20:55 EDT Ventricular Rate:  60 PR Interval:  158 QRS Duration:  92 QT Interval:  438 QTC Calculation: 438 R Axis:   -8  Text Interpretation: Normal sinus rhythm with sinus arrhythmia Nonspecific ST and T wave abnormality When compared with ECG of 09-Feb-2022 20:16, rate is slower Confirmed by Rollene Rotunda ([id number]) on 03/08/2023 1:34:59 PM  Risk Assessment/Calculations:    CHA2DS2-VASc Score = 6   This indicates a 9.7% annual risk of stroke. The patient's score is based upon: CHF History: 1 HTN History: 1 Diabetes History: 1 Stroke History: 0 Vascular Disease History: 0 Age Score: 2 Gender Score: 1    Physical Exam:   VS:  BP (!) 152/80   Pulse 60   Ht 5\' 3"  (1.6 m)   Wt 201 lb (91.2 kg)   BMI 35.61 kg/m    Wt Readings from Last 3 Encounters:  03/08/23 201 lb (91.2 kg)  02/23/23 200 lb 6.4 oz (90.9 kg)  02/01/23 197 lb 15.6 oz (89.8 kg)     GEN: Well nourished, well developed in no acute distress NECK: No JVD; No carotid bruits CARDIAC: RRR, no murmurs, rubs, gallops RESPIRATORY:  Clear to auscultation without rales, wheezing or rhonchi  ABDOMEN: Soft, non-tender, non-distended EXTREMITIES:  No edema; No deformity   ASSESSMENT AND PLAN:   Mitral valve regurgitation with moderate mitral annular calcification:     She had mild regurgitation  on echo in September 2023.  I will repeat a transthoracic echo.   History of atrial fibrillation with RVR:  I have seen [name] [name] is a 82 y.o. female in the office today who is being considered for a Watchman left atrial appendage closure device.  She has a history of paroxysmal atrial fibrillation.  This patients CHA2DS2-VASc Score and unadjusted Ischemic Stroke Rate (% per year) is equal to 9.7 % stroke rate/year from a score of 6 which necessitates long term oral anticoagulation to prevent stroke.  Unfortunately, She is not felt to be a long term long-term  anticoagulation candidate secondary to persistent GI bleeding.  The patients chart has been reviewed and I feel that they would be a candidate for short term oral anticoagulation.  Procedural risks for the Watchman implant have been reviewed with the patient including a 1% risk of stroke, 2% risk of perforation, 0.1% risk of device embolization.  Given the patient's poor candidacy for long-term oral anticoagulation and ability to tolerate short term oral anticoagulation I have recommended the watchman left atrial appendage closure system.  She should be on a higher dose of anticoagulation but she had to have it reduced because of recurrent bleeding.  I will leave it where it is for now.    History of systolic heart failure:   She seems to be euvolemic.  No change in therapy.   HTN: Her blood pressure is elevated but she says it is well-controlled at home and it has been in recent checks.  She will keep a blood pressure diary.       Follow up me in one year.   Signed, Rollene Rotunda, MD

## 2023-03-08 ENCOUNTER — Other Ambulatory Visit: Payer: Self-pay | Admitting: *Deleted

## 2023-03-08 ENCOUNTER — Ambulatory Visit: Payer: PPO | Admitting: Cardiology

## 2023-03-08 ENCOUNTER — Encounter: Payer: Self-pay | Admitting: Cardiology

## 2023-03-08 VITALS — BP 152/80 | HR 60 | Ht 63.0 in | Wt 201.0 lb

## 2023-03-08 DIAGNOSIS — I10 Essential (primary) hypertension: Secondary | ICD-10-CM

## 2023-03-08 DIAGNOSIS — I340 Nonrheumatic mitral (valve) insufficiency: Secondary | ICD-10-CM | POA: Diagnosis not present

## 2023-03-08 DIAGNOSIS — M1A9XX0 Chronic gout, unspecified, without tophus (tophi): Secondary | ICD-10-CM

## 2023-03-08 DIAGNOSIS — I4891 Unspecified atrial fibrillation: Secondary | ICD-10-CM | POA: Diagnosis not present

## 2023-03-08 DIAGNOSIS — I5022 Chronic systolic (congestive) heart failure: Secondary | ICD-10-CM

## 2023-03-08 MED ORDER — ALLOPURINOL 100 MG PO TABS
100.0000 mg | ORAL_TABLET | Freq: Every day | ORAL | 0 refills | Status: DC
Start: 2023-03-08 — End: 2023-04-29

## 2023-03-08 NOTE — Patient Instructions (Signed)
Medication Instructions:  The current medical regimen is effective;  continue present plan and medications.  *If you need a refill on your cardiac medications before your next appointment, please call your pharmacy*  You have been referred to the electrophysiologist to discuss Watchman procedure.  You will be contacted to be scheduled.   Testing/Procedures: Your physician has requested that you have an echocardiogram at [facility]. Echocardiography is a painless test that uses sound waves to create images of your heart. It provides your doctor with information about the size and shape of your heart and how well your heart's chambers and valves are working. This procedure takes approximately one hour. There are no restrictions for this procedure. Please do NOT wear cologne, perfume, aftershave, or lotions (deodorant is allowed). Please arrive 15 minutes prior to your appointment time. You will be contacted to be scheduled.  Follow-Up: At [facility], you and your health needs are our priority.  As part of our continuing mission to provide you with exceptional heart care, we have created designated Provider Care Teams.  These Care Teams include your primary Cardiologist (physician) and Advanced Practice Providers (APPs -  Physician Assistants and Nurse Practitioners) who all work together to provide you with the care you need, when you need it.  We recommend signing up for the patient portal called "MyChart".  Sign up information is provided on this After Visit Summary.  MyChart is used to connect with patients for Virtual Visits (Telemedicine).  Patients are able to view lab/test results, encounter notes, upcoming appointments, etc.  Non-urgent messages can be sent to your provider as well.   To learn more about what you can do with MyChart, go to [email or web address].    Your next appointment:   1 year(s)  Provider:   Rollene Rotunda, MD

## 2023-03-10 ENCOUNTER — Inpatient Hospital Stay: Payer: PPO

## 2023-03-10 VITALS — BP 164/56 | HR 44 | Temp 97.1°F | Resp 18

## 2023-03-10 DIAGNOSIS — D500 Iron deficiency anemia secondary to blood loss (chronic): Secondary | ICD-10-CM | POA: Diagnosis not present

## 2023-03-10 MED ORDER — SODIUM CHLORIDE 0.9 % IV SOLN
510.0000 mg | Freq: Once | INTRAVENOUS | Status: AC
  Administered 2023-03-10: 510 mg via INTRAVENOUS
  Filled 2023-03-10: qty 510

## 2023-03-10 MED ORDER — SODIUM CHLORIDE 0.9 % IV SOLN: Freq: Once | INTRAVENOUS | Status: AC

## 2023-03-10 NOTE — Patient Instructions (Signed)
[facility]  Discharge Instructions: Thank you for choosing [facility] to provide your oncology and hematology care.  If you have a lab appointment with the Cancer Center - please note that after April 8th, 2024, all labs will be drawn in the cancer center.  You do not have to check in or register with the main entrance as you have in the past but will complete your check-in in the cancer center.  Wear comfortable clothing and clothing appropriate for easy access to any Portacath or PICC line.   We strive to give you quality time with your provider. You may need to reschedule your appointment if you arrive late (15 or more minutes).  Arriving late affects you and other patients whose appointments are after yours.  Also, if you miss three or more appointments without notifying the office, you may be dismissed from the clinic at the provider's discretion.      For prescription refill requests, have your pharmacy contact our office and allow 72 hours for refills to be completed.    Today you received the following chemotherapy and/or immunotherapy agents feraheme      To help prevent nausea and vomiting after your treatment, we encourage you to take your nausea medication as directed.  BELOW ARE SYMPTOMS THAT SHOULD BE REPORTED IMMEDIATELY: *FEVER GREATER THAN 100.4 F (38 C) OR HIGHER *CHILLS OR SWEATING *NAUSEA AND VOMITING THAT IS NOT CONTROLLED WITH YOUR NAUSEA MEDICATION *UNUSUAL SHORTNESS OF BREATH *UNUSUAL BRUISING OR BLEEDING *URINARY PROBLEMS (pain or burning when urinating, or frequent urination) *BOWEL PROBLEMS (unusual diarrhea, constipation, pain near the anus) TENDERNESS IN MOUTH AND THROAT WITH OR WITHOUT PRESENCE OF ULCERS (sore throat, sores in mouth, or a toothache) UNUSUAL RASH, SWELLING OR PAIN  UNUSUAL VAGINAL DISCHARGE OR ITCHING   Items with * indicate a potential emergency and should be followed up as soon as possible or go to the  Emergency Department if any problems should occur.  Please show the CHEMOTHERAPY ALERT CARD or IMMUNOTHERAPY ALERT CARD at check-in to the Emergency Department and triage nurse.  Should you have questions after your visit or need to cancel or reschedule your appointment, please contact [facility] CENTER AT [facility] [phone]  and follow the prompts.  Office hours are 8:00 a.m. to 4:30 p.m. Monday - Friday. Please note that voicemails left after 4:00 p.m. may not be returned until the following business day.  We are closed weekends and major holidays. You have access to a nurse at all times for urgent questions. Please call the main number to the clinic [phone] and follow the prompts.  For any non-urgent questions, you may also contact your provider using MyChart. We now offer e-Visits for anyone 93 and older to request care online for non-urgent symptoms. For details visit [email or web address].   Also download the MyChart app! Go to the app store, search "MyChart", open the app, select [place], and log in with your MyChart username and password.

## 2023-03-10 NOTE — Progress Notes (Signed)
Stable during infusion without adverse affects.  Vital signs stable.  No complaints at this time.  Discharge from clinic ambulatory in stable condition.  Alert and oriented X 3.  Follow up with [facility] as scheduled.  

## 2023-03-10 NOTE — Progress Notes (Signed)
Patient presents today for Feraheme infusion.Vital signs stable. Patient took pre-medications at home prior to arrival. Tylenol 650 PO and Claritin 10 mg PO at 0830.

## 2023-03-16 ENCOUNTER — Ambulatory Visit (HOSPITAL_COMMUNITY)
Admission: RE | Admit: 2023-03-16 | Discharge: 2023-03-16 | Disposition: A | Discharge status: Home / Self Care | Payer: PPO | Source: Ambulatory Visit | Attending: Cardiology | Admitting: Cardiology

## 2023-03-16 DIAGNOSIS — I517 Cardiomegaly: Secondary | ICD-10-CM | POA: Insufficient documentation

## 2023-03-16 DIAGNOSIS — I5022 Chronic systolic (congestive) heart failure: Secondary | ICD-10-CM

## 2023-03-16 DIAGNOSIS — I4891 Unspecified atrial fibrillation: Secondary | ICD-10-CM | POA: Diagnosis not present

## 2023-03-16 DIAGNOSIS — I340 Nonrheumatic mitral (valve) insufficiency: Secondary | ICD-10-CM

## 2023-03-16 LAB — ECHOCARDIOGRAM COMPLETE
Area-P 1/2: 3.12 cm2
Calc EF: 59.7 %
MV M vel: 4.88 m/s
MV Peak grad: 95.3 mmHg
Radius: 0.6 cm
S' Lateral: 3.65 cm
Single Plane A2C EF: 62 %
Single Plane A4C EF: 59.5 %

## 2023-03-16 NOTE — Progress Notes (Signed)
  Echocardiogram 2D Echocardiogram has been performed.  [name] [name] 03/16/2023, 1:52 PM

## 2023-03-17 ENCOUNTER — Telehealth: Payer: Self-pay | Admitting: Pharmacist

## 2023-03-17 DIAGNOSIS — N1831 Chronic kidney disease, stage 3a: Secondary | ICD-10-CM

## 2023-03-17 MED ORDER — DAPAGLIFLOZIN PROPANEDIOL 10 MG PO TABS: 10.0000 mg | ORAL_TABLET | Freq: Every day | ORAL | 5 refills | Status: DC

## 2023-03-17 NOTE — Telephone Encounter (Signed)
    03/17/2023 Name: [name] [name] MRN: [id number] DOB: [birth year]   [place] the [place] for Farxiga.  Updated RX escribed to [facility] mail order (pharmacy for [place] patient [place]).  Patient is stable on current regimen.  She will need to re-enroll for 2025 in November.    A/P: CKD3b--GFR 42 UACR 183 (05/09/22)  Continue current regimen All other meds appropriate per renal function Follow up to re-enroll at the end of 2024 for Farxiga PAP next year   Kieth Brightly, PharmD, BCACP Clinical Pharmacist, [facility]

## 2023-03-22 ENCOUNTER — Encounter: Payer: Self-pay | Admitting: Cardiology

## 2023-03-22 ENCOUNTER — Ambulatory Visit: Payer: PPO | Attending: Cardiology | Admitting: Cardiology

## 2023-03-22 VITALS — BP 152/68 | HR 108 | Ht 63.0 in | Wt 200.0 lb

## 2023-03-22 DIAGNOSIS — I480 Paroxysmal atrial fibrillation: Secondary | ICD-10-CM

## 2023-03-22 DIAGNOSIS — Z8719 Personal history of other diseases of the digestive system: Secondary | ICD-10-CM | POA: Diagnosis not present

## 2023-03-22 DIAGNOSIS — I5022 Chronic systolic (congestive) heart failure: Secondary | ICD-10-CM | POA: Diagnosis not present

## 2023-03-22 NOTE — Patient Instructions (Signed)
 Medication Instructions:  Your physician recommends that you continue on your current medications as directed. Please refer to the Current Medication list given to you today.  *If you need a refill on your cardiac medications before your next appointment, please call your pharmacy*  Testing/Procedures: Your physician has requested that you have Left atrial appendage (LAA) closure device implantation is a procedure to put a small device in the LAA of the heart. The LAA is a small sac in the wall of the heart's left upper chamber. Blood clots can form in this area. The device, Watchman closes the LAA to help prevent a blood clot and stroke.   Follow-Up: At [facility], you and your health needs are our priority.  As part of our continuing mission to provide you with exceptional heart care, we have created designated Provider Care Teams.  These Care Teams include your primary Cardiologist (physician) and Advanced Practice Providers (APPs -  Physician Assistants and Nurse Practitioners) who all work together to provide you with the care you need, when you need it.  Your next appointment:   You will be contacted by Nurse Navigator, Karsten Fells to schedule your pre-procedure visit and procedure date. If you have any questions she can be reached at [phone].

## 2023-03-22 NOTE — Progress Notes (Signed)
Electrophysiology Office Note:    Date:  03/22/2023   ID:  [name] [name], [name] [name] 03/25/1941, MRN [id number]  [facility] Cardiologist:  Rollene Rotunda, MD  [facility] Electrophysiologist:  None   Referring MD: Gabriel Earing, FNP   Chief Complaint: Atrial fibrillation  History of Present Illness:    [name] [name] is a 82 y.o. femalewho I am seeing today for an evaluation of atrial fibrillation at the request of Dr. Antoine Poche.  The patient was last seen by Dr. Antoine Poche on March 08, 2023.  The patient has a medical history that includes chronic systolic heart failure, at least moderate mitral regurgitation, GI bleeding.  At the last appointment with Dr. Antoine Poche, left atrial appendage occlusion was discussed with the patient as a mechanism to avoid long-term exposure to anticoagulation given her history of GI bleeding.          Their past medical, social and family history was reveiwed.   ROS:   Please see the history of present illness.    All other systems reviewed and are negative.  EKGs/Labs/Other Studies Reviewed:    The following studies were reviewed today:  March 16, 2023 echo EF 60-65 RV normal Severely dilated left and right atrium Mild to moderate MR  March 08, 2023 EKG shows sinus rhythm  April 03, 2019 chest CT reviewed Difficult to assess left atrial appendage dimensions given nongated CT.      Physical Exam:    VS:  BP (!) 152/68   Pulse (!) 108   Ht 5\' 3"  (1.6 m)   Wt 200 lb (90.7 kg)   SpO2 96%   BMI 35.43 kg/m     Wt Readings from Last 3 Encounters:  03/22/23 200 lb (90.7 kg)  03/08/23 201 lb (91.2 kg)  02/23/23 200 lb 6.4 oz (90.9 kg)     GEN:  Well nourished, well developed in no acute distress CARDIAC: RRR, no murmurs, rubs, gallops RESPIRATORY:  Clear to auscultation without rales, wheezing or rhonchi       ASSESSMENT AND PLAN:    1. Chronic systolic congestive heart failure (HCC)   2. Paroxysmal  atrial fibrillation (HCC)   3. History of GI bleed     #Paroxysmal atrial fibrillation #GI bleeding The patient largely maintained sinus rhythm.  She is referred to discuss left atrial appendage occlusion as a mechanism to reduce her stroke risk given her history of GI bleeding.  I discussed the procedure in detail including the risks and likelihood of success.  She would like to proceed with the evaluation.  I think she is an acceptable candidate.  --------------  I have seen [name] [name] in the office today who is being considered for a Watchman left atrial appendage closure device. I believe they will benefit from this procedure given their history of atrial fibrillation, CHA2DS2-VASc score of 6. Unfortunately, the patient is not felt to be a long term anticoagulation candidate secondary to GI bleeding. The patient's chart has been reviewed and I feel that they would be a candidate for short term oral anticoagulation after Watchman implant.   It is my belief that after undergoing a LAA closure procedure, [name] [name] will not need long term anticoagulation which eliminates anticoagulation side effects and major bleeding risk.   Procedural risks for the Watchman implant have been reviewed with the patient including a 0.5% risk of stroke, <1% risk of perforation and <1% risk of device embolization. Other risks include bleeding, vascular damage, tamponade,  worsening renal function, and death. The patient understands these risk and wishes to proceed.     The published clinical data on the safety and effectiveness of WATCHMAN include but are not limited to the following: - Holmes DR, Everlene Farrier, Sick P et al. for the PROTECT AF Investigators. Percutaneous closure of the left atrial appendage versus warfarin therapy for prevention of stroke in patients with atrial fibrillation: a randomised non-inferiority trial. Lancet 2009; 374: 534-42. Everlene Farrier, Doshi SK, Isa Rankin D et al.  on behalf of the PROTECT AF Investigators. Percutaneous Left Atrial Appendage Closure for Stroke Prophylaxis in Patients With Atrial Fibrillation 2.3-Year Follow-up of the PROTECT AF (Watchman Left Atrial Appendage System for Embolic Protection in Patients With Atrial Fibrillation) Trial. Circulation 2013; 127:720-729. - Alli O, Doshi S,  Kar S, Reddy VY, Sievert H et al. Quality of Life Assessment in the Randomized PROTECT AF (Percutaneous Closure of the Left Atrial Appendage Versus Warfarin Therapy for Prevention of Stroke in Patients With Atrial Fibrillation) Trial of Patients at Risk for Stroke With Nonvalvular Atrial Fibrillation. J Am Coll Cardiol 2013; 61:1790-8. Aline August DR, Mia [place], Price M, Whisenant B, Sievert H, Doshi S, Huber K, Reddy V. Prospective randomized evaluation of the Watchman left atrial appendage Device in patients with atrial fibrillation versus long-term warfarin therapy; the PREVAIL trial. Journal of the [employer] of Cardiology, Vol. 4, No. 1, 2014, 1-11. - Kar S, Doshi SK, Sadhu A, Horton R, Osorio J et al. Primary outcome evaluation of a next-generation left atrial appendage closure device: results from the PINNACLE FLX trial. Circulation 2021;143(18)1754-1762.    After today's visit with the patient which was dedicated solely for shared decision making visit regarding LAA closure device, the patient decided to proceed with the LAA appendage closure procedure scheduled to be done in the near future at [facility].  We would not pursue preprocedural imaging with CT scan given her abnormal kidney function.  HAS-BLED score 3 Hypertension Yes  Abnormal renal and liver function (Dialysis, transplant, Cr >2.26 mg/dL /Cirrhosis or Bilirubin >2x Normal or AST/ALT/AP >3x Normal) No  Stroke No  Bleeding Yes  Labile INR (Unstable/high INR) No  Elderly (>65) Yes  Drugs or alcohol (? 8 drinks/week, anti-plt or NSAID) No   CHA2DS2-VASc Score = 6  The patient's score  is based upon: CHF History: 1 HTN History: 1 Diabetes History: 1 Stroke History: 0 Vascular Disease History: 0 Age Score: 2 Gender Score: 1   Will plan to keep her on Eliquis 2.5 mg by mouth twice daily in the periprocedural period.  Plan to continue 2.5 mg by mouth twice daily with a goal of 6 months post implant therapy.  I discussed how this is a variation from the recommendations from the clinical trials although there are data supporting this regimen in patients with high risk bleeding.      Signed, Rossie Muskrat. Lalla Brothers, MD, [facility], [facility] 03/22/2023 2:59 PM    Electrophysiology [facility]

## 2023-03-23 ENCOUNTER — Other Ambulatory Visit: Payer: Self-pay

## 2023-03-23 DIAGNOSIS — I480 Paroxysmal atrial fibrillation: Secondary | ICD-10-CM

## 2023-03-23 DIAGNOSIS — Z8719 Personal history of other diseases of the digestive system: Secondary | ICD-10-CM

## 2023-04-05 ENCOUNTER — Telehealth: Payer: Self-pay | Admitting: *Deleted

## 2023-04-05 MED ORDER — AMLODIPINE BESYLATE 5 MG PO TABS: 5.0000 mg | ORAL_TABLET | Freq: Every day | ORAL | 3 refills | Status: DC

## 2023-04-05 NOTE — Telephone Encounter (Signed)
Received blood pressure log from pt which Dr Antoine Poche reviewed.  Per his verbal order, pt is aware to increase Amlodipine to 5 mg po daily.  RX sent electronically to [facility] at her request.

## 2023-04-13 ENCOUNTER — Encounter: Payer: Self-pay | Admitting: Gastroenterology

## 2023-04-14 ENCOUNTER — Telehealth: Payer: Self-pay

## 2023-04-17 ENCOUNTER — Other Ambulatory Visit: Payer: Self-pay | Admitting: Family Medicine

## 2023-04-17 DIAGNOSIS — E785 Hyperlipidemia, unspecified: Secondary | ICD-10-CM

## 2023-04-20 ENCOUNTER — Ambulatory Visit: Payer: PPO | Admitting: Family Medicine

## 2023-04-20 ENCOUNTER — Encounter: Payer: Self-pay | Admitting: Family Medicine

## 2023-04-20 VITALS — BP 130/77 | HR 61 | Temp 97.8°F | Ht 63.0 in | Wt 201.0 lb

## 2023-04-20 DIAGNOSIS — R809 Proteinuria, unspecified: Secondary | ICD-10-CM | POA: Diagnosis not present

## 2023-04-20 DIAGNOSIS — N3281 Overactive bladder: Secondary | ICD-10-CM | POA: Diagnosis not present

## 2023-04-20 DIAGNOSIS — I10 Essential (primary) hypertension: Secondary | ICD-10-CM | POA: Diagnosis not present

## 2023-04-20 DIAGNOSIS — N1831 Chronic kidney disease, stage 3a: Secondary | ICD-10-CM

## 2023-04-20 DIAGNOSIS — Z23 Encounter for immunization: Secondary | ICD-10-CM

## 2023-04-20 DIAGNOSIS — E039 Hypothyroidism, unspecified: Secondary | ICD-10-CM | POA: Diagnosis not present

## 2023-04-20 DIAGNOSIS — K219 Gastro-esophageal reflux disease without esophagitis: Secondary | ICD-10-CM | POA: Diagnosis not present

## 2023-04-20 DIAGNOSIS — E782 Mixed hyperlipidemia: Secondary | ICD-10-CM | POA: Diagnosis not present

## 2023-04-20 DIAGNOSIS — R7303 Prediabetes: Secondary | ICD-10-CM | POA: Diagnosis not present

## 2023-04-20 DIAGNOSIS — M1A9XX0 Chronic gout, unspecified, without tophus (tophi): Secondary | ICD-10-CM

## 2023-04-20 DIAGNOSIS — I482 Chronic atrial fibrillation, unspecified: Secondary | ICD-10-CM

## 2023-04-20 LAB — CMP14+EGFR
ALT: 11 IU/L (ref 0–32)
AST: 14 IU/L (ref 0–40)
Albumin: 4.2 g/dL (ref 3.7–4.7)
Alkaline Phosphatase: 115 IU/L (ref 44–121)
BUN/Creatinine Ratio: 20 (ref 12–28)
BUN: 27 mg/dL (ref 8–27)
Bilirubin Total: 0.2 mg/dL (ref 0.0–1.2)
CO2: 20 mmol/L (ref 20–29)
Calcium: 11.1 mg/dL — ABNORMAL HIGH (ref 8.7–10.3)
Chloride: 108 mmol/L — ABNORMAL HIGH (ref 96–106)
Creatinine, Ser: 1.33 mg/dL — ABNORMAL HIGH (ref 0.57–1.00)
Globulin, Total: 1.9 g/dL (ref 1.5–4.5)
Glucose: 104 mg/dL — ABNORMAL HIGH (ref 70–99)
Potassium: 4.2 mmol/L (ref 3.5–5.2)
Sodium: 142 mmol/L (ref 134–144)
Total Protein: 6.1 g/dL (ref 6.0–8.5)
eGFR: 40 mL/min/{1.73_m2} — ABNORMAL LOW (ref >59)

## 2023-04-20 LAB — TSH

## 2023-04-20 LAB — LIPID PANEL
Chol/HDL Ratio: 3.5 ratio (ref 0.0–4.4)
Cholesterol, Total: 154 mg/dL (ref 100–199)
HDL: 44 mg/dL (ref >39)
LDL Chol Calc (NIH): 79 mg/dL (ref 0–99)
Triglycerides: 182 mg/dL — ABNORMAL HIGH (ref 0–149)
VLDL Cholesterol Cal: 31 mg/dL (ref 5–40)

## 2023-04-20 LAB — BAYER DCA HB A1C WAIVED: HB A1C (BAYER DCA - WAIVED): 4.8 % (ref 4.8–5.6)

## 2023-04-20 LAB — T4, FREE

## 2023-04-20 LAB — VITAMIN D 25 HYDROXY (VIT D DEFICIENCY, FRACTURES)

## 2023-04-20 NOTE — Progress Notes (Signed)
Established Patient Office Visit  Subjective   Patient ID: [name] [name], female    DOB: Feb 13, 1941  Age: 82 y.o. MRN: [id number]  Chief Complaint  Patient presents with   Medical Management of Chronic Issues   Hypertension   Hypothyroidism   Prediabetes    HPI  HTN Complaint with meds - Yes Current Medications - amlodipine 5 mg, losartan 25 mg, lasix 20 mg Checking BP at home ranging 120-130s/60-80s Pertinent ROS:  Headache - No Fatigue - No Visual Disturbances - No Chest pain - No Dyspnea - with exertion Palpitations - No LE edema - baseline  Will have watchman device place in November. Still on eliquis 2.5 mg BID. They will plan to discontinue eliquis 6 months post procedure.   2. HLD She is on atorvastatin. She is not fasting this morning. Last LDL was 86.  3. GERD Compliant with medications - Yes Current medications - protonix 40 mg Cough - No Sore throat - No Voice change - No Hemoptysis - No Dysphagia or dyspepsia - No Water brash - No Red Flags (weight loss, hematochezia, melena, weight loss, early satiety, fevers, odynophagia, or persistent vomiting) - No  4. Thyroid Managed by endo. Has been referred to Dr. Gerrit Friends for surgery for elevated parathyroid.   5. Anemia Recently had 2 iron infusion and 2 units of PRBCs after hemoglobin dropped from GI bleed. Denies dizziness, lightheadedness, syncope, blood in stool since. Will follow up next week with hematology.   7. OAB Stable with oxybutynin.   8. Gout Taking allopurinol. No flares.   9. Hypothyroidism Compliant with levothyroxine 50 mcg daily. Denies symptoms.   Past Medical History:  Diagnosis Date   Anemia    Arthritis    Ostearthritis- hips, knees, fingers   Atrial fibrillation (HCC)    Basal cell carcinoma (BCC) of right hand 10/2021   Dx by [facility]   DVT (deep venous thrombosis) (HCC)    Dyspnea    Dysrhythmia    GERD (gastroesophageal reflux disease)    Headache(784.0)     tx. Valproic acid   History of diabetes mellitus    History of kidney stones    Hypertension    Hypothyroidism    Pulmonary embolism (HCC)       ROS As per HPI.    Objective:     BP 130/77   Pulse 61   Temp 97.8 F (36.6 C) (Temporal)   Ht 5\' 3"  (1.6 m)   Wt 201 lb (91.2 kg)   SpO2 99%   BMI 35.61 kg/m   Wt Readings from Last 3 Encounters:  04/20/23 201 lb (91.2 kg)  03/22/23 200 lb (90.7 kg)  03/08/23 201 lb (91.2 kg)     Physical Exam Vitals and nursing note reviewed.  Constitutional:      General: She is not in acute distress.    Appearance: She is obese. She is not ill-appearing, toxic-appearing or diaphoretic.  Neck:     Vascular: No carotid bruit.  Cardiovascular:     Rate and Rhythm: Normal rate and regular rhythm.     Heart sounds: Normal heart sounds. No murmur heard. Pulmonary:     Effort: Pulmonary effort is normal. No respiratory distress.     Breath sounds: Normal breath sounds. No wheezing or rhonchi.  Abdominal:     General: Bowel sounds are normal. There is no distension.     Palpations: Abdomen is soft.     Tenderness: There is no abdominal tenderness.  There is no guarding or rebound.  Musculoskeletal:     Cervical back: Neck supple. No rigidity.     Right lower leg: No edema.     Left lower leg: No edema.  Skin:    General: Skin is warm and dry.  Neurological:     General: No focal deficit present.     Mental Status: She is alert and oriented to person, place, and time.     Gait: Gait abnormal (using cane).  Psychiatric:        Mood and Affect: Mood normal.        Behavior: Behavior normal.        Thought Content: Thought content normal.        Judgment: Judgment normal.    No results found for any visits on 04/20/23.   The ASCVD Risk score (Arnett DK, et al., 2019) failed to calculate for the following reasons:   The 2019 ASCVD risk score is only valid for ages 71 to 81    Assessment & Plan:   [name] [name] was seen today for  medical management of chronic issues, hypertension, hypothyroidism and prediabetes.  Diagnoses and all orders for this visit:  Primary hypertension Well controlled on current regimen.  -     Lipid panel  Prediabetes A1c is 4.8 today.  -     Bayer DCA Hb A1c Waived  Mixed hyperlipidemia Not fasting today. Last LDL 86. On statin.   Morbid obesity (HCC) Stable weight. Diet, exercise as able.   Microalbuminuria On farxiga, ARB. -     Microalbumin / creatinine urine ratio  Chronic a-fib (HCC) Managed by cardiology. On eliquis. Planning watchman device so that she can come off of eliquis given recurrent GI bleeds resulting in significant anemia.   Stage 3a chronic kidney disease (HCC) Labs pending. Avoid NSAIDs.  -     VITAMIN D 25 Hydroxy (Vit-D Deficiency, Fractures) -     CMP14+EGFR  Acquired hypothyroidism On levothyroxine 50 mcg.  -     TSH -     T4, Free  Chronic gout without tophus, unspecified cause, unspecified site Well controlled on current regimen. Continue allopurinol.  -     Uric acid  Gastroesophageal reflux disease without esophagitis Well controlled on current regimen.   Overactive bladder Continue myrbetriq  Need for vaccination Flu vaccine today.   Return in about 6 months (around 10/18/2023) for CPE.   The patient indicates understanding of these issues and agrees with the plan.  Gabriel Earing, FNP

## 2023-04-21 LAB — MICROALBUMIN / CREATININE URINE RATIO
Creatinine, Urine: 37.5 mg/dL
Microalb/Creat Ratio: 180 mg/g{creat} — ABNORMAL HIGH (ref 0–29)
Microalbumin, Urine: 67.5 ug/mL

## 2023-04-21 LAB — URIC ACID: Uric Acid: 6 mg/dL (ref 3.1–7.9)

## 2023-04-24 ENCOUNTER — Ambulatory Visit: Payer: PPO

## 2023-04-26 ENCOUNTER — Other Ambulatory Visit: Payer: Self-pay

## 2023-04-26 DIAGNOSIS — D500 Iron deficiency anemia secondary to blood loss (chronic): Secondary | ICD-10-CM

## 2023-04-27 ENCOUNTER — Inpatient Hospital Stay: Payer: PPO | Attending: Physician Assistant

## 2023-04-27 ENCOUNTER — Other Ambulatory Visit: Payer: PPO

## 2023-04-27 DIAGNOSIS — K5900 Constipation, unspecified: Secondary | ICD-10-CM | POA: Diagnosis not present

## 2023-04-27 DIAGNOSIS — D500 Iron deficiency anemia secondary to blood loss (chronic): Secondary | ICD-10-CM | POA: Diagnosis not present

## 2023-04-27 DIAGNOSIS — E1122 Type 2 diabetes mellitus with diabetic chronic kidney disease: Secondary | ICD-10-CM | POA: Diagnosis not present

## 2023-04-27 DIAGNOSIS — K31811 Angiodysplasia of stomach and duodenum with bleeding: Secondary | ICD-10-CM | POA: Diagnosis not present

## 2023-04-27 DIAGNOSIS — Z800 Family history of malignant neoplasm of digestive organs: Secondary | ICD-10-CM | POA: Insufficient documentation

## 2023-04-27 DIAGNOSIS — I129 Hypertensive chronic kidney disease with stage 1 through stage 4 chronic kidney disease, or unspecified chronic kidney disease: Secondary | ICD-10-CM | POA: Diagnosis not present

## 2023-04-27 DIAGNOSIS — Z7901 Long term (current) use of anticoagulants: Secondary | ICD-10-CM | POA: Diagnosis not present

## 2023-04-27 DIAGNOSIS — K219 Gastro-esophageal reflux disease without esophagitis: Secondary | ICD-10-CM | POA: Insufficient documentation

## 2023-04-27 DIAGNOSIS — R059 Cough, unspecified: Secondary | ICD-10-CM | POA: Insufficient documentation

## 2023-04-27 DIAGNOSIS — Z7984 Long term (current) use of oral hypoglycemic drugs: Secondary | ICD-10-CM | POA: Diagnosis not present

## 2023-04-27 DIAGNOSIS — Z79890 Hormone replacement therapy (postmenopausal): Secondary | ICD-10-CM | POA: Insufficient documentation

## 2023-04-27 DIAGNOSIS — E039 Hypothyroidism, unspecified: Secondary | ICD-10-CM | POA: Insufficient documentation

## 2023-04-27 DIAGNOSIS — Z85828 Personal history of other malignant neoplasm of skin: Secondary | ICD-10-CM | POA: Diagnosis not present

## 2023-04-27 DIAGNOSIS — Z87442 Personal history of urinary calculi: Secondary | ICD-10-CM | POA: Insufficient documentation

## 2023-04-27 DIAGNOSIS — Z79899 Other long term (current) drug therapy: Secondary | ICD-10-CM | POA: Diagnosis not present

## 2023-04-27 DIAGNOSIS — I4891 Unspecified atrial fibrillation: Secondary | ICD-10-CM | POA: Insufficient documentation

## 2023-04-27 DIAGNOSIS — Z86711 Personal history of pulmonary embolism: Secondary | ICD-10-CM | POA: Diagnosis not present

## 2023-04-27 DIAGNOSIS — K449 Diaphragmatic hernia without obstruction or gangrene: Secondary | ICD-10-CM | POA: Insufficient documentation

## 2023-04-27 DIAGNOSIS — N1832 Chronic kidney disease, stage 3b: Secondary | ICD-10-CM | POA: Diagnosis not present

## 2023-04-27 DIAGNOSIS — D631 Anemia in chronic kidney disease: Secondary | ICD-10-CM | POA: Insufficient documentation

## 2023-04-27 DIAGNOSIS — Z86718 Personal history of other venous thrombosis and embolism: Secondary | ICD-10-CM | POA: Diagnosis not present

## 2023-04-27 LAB — CBC
HCT: 38.8 % (ref 36.0–46.0)
Hemoglobin: 12.2 g/dL (ref 12.0–15.0)
MCH: 30.5 pg (ref 26.0–34.0)
MCHC: 31.4 g/dL (ref 30.0–36.0)
MCV: 97 fL (ref 80.0–100.0)
Platelets: 202 10*3/uL (ref 150–400)
RBC: 4 MIL/uL (ref 3.87–5.11)
RDW: 14.9 % (ref 11.5–15.5)
WBC: 4.6 10*3/uL (ref 4.0–10.5)
nRBC: 0 % (ref 0.0–0.2)

## 2023-04-27 LAB — FERRITIN: Ferritin: 21 ng/mL (ref 11–307)

## 2023-04-27 LAB — BASIC METABOLIC PANEL
Anion gap: 10 (ref 5–15)
BUN: 26 mg/dL — ABNORMAL HIGH (ref 8–23)
CO2: 22 mmol/L (ref 22–32)
Calcium: 10.4 mg/dL — ABNORMAL HIGH (ref 8.9–10.3)
Chloride: 106 mmol/L (ref 98–111)
Creatinine, Ser: 1.19 mg/dL — ABNORMAL HIGH (ref 0.44–1.00)
GFR, Estimated: 46 mL/min — ABNORMAL LOW (ref >60)
Glucose, Bld: 103 mg/dL — ABNORMAL HIGH (ref 70–99)
Potassium: 4.1 mmol/L (ref 3.5–5.1)
Sodium: 138 mmol/L (ref 135–145)

## 2023-04-27 LAB — SAMPLE TO BLOOD BANK

## 2023-04-27 LAB — IRON AND TIBC
Iron: 29 ug/dL (ref 28–170)
Saturation Ratios: 9 % — ABNORMAL LOW (ref 10.4–31.8)
TIBC: 340 ug/dL (ref 250–450)
UIBC: 311 ug/dL

## 2023-04-28 ENCOUNTER — Other Ambulatory Visit: Payer: PPO

## 2023-04-29 ENCOUNTER — Other Ambulatory Visit: Payer: Self-pay | Admitting: Family Medicine

## 2023-04-29 DIAGNOSIS — M1A9XX0 Chronic gout, unspecified, without tophus (tophi): Secondary | ICD-10-CM

## 2023-05-04 ENCOUNTER — Ambulatory Visit: Payer: PPO | Admitting: Oncology

## 2023-05-04 MED ORDER — ALLOPURINOL 100 MG PO TABS: 100.0000 mg | ORAL_TABLET | Freq: Every day | ORAL | 0 refills | Status: DC

## 2023-05-05 ENCOUNTER — Inpatient Hospital Stay: Payer: PPO | Admitting: Oncology

## 2023-05-05 VITALS — BP 153/71 | HR 47 | Temp 98.1°F | Resp 16

## 2023-05-05 DIAGNOSIS — D500 Iron deficiency anemia secondary to blood loss (chronic): Secondary | ICD-10-CM | POA: Diagnosis not present

## 2023-05-05 NOTE — Progress Notes (Signed)
[facility] [address], [place] [address]   CLINIC:  Medical Oncology/Hematology  PCP:  Gabriel Earing, FNP [address] [place] [address] [phone]   REASON FOR VISIT:  Follow-up for iron deficiency anemia   PRIOR THERAPY: Blood transfusions and oral iron tablets   CURRENT THERAPY: Intermittent IV iron  INTERVAL HISTORY:   [name] [name] 82 y.o. female returns for routine follow-up of her iron deficiency anemia.  She was last seen by me on 02/23/2023.  She denies any additional hospitalizations, surgeries or changes in her baseline health.  She received 2 doses of IV Feraheme on 03/03/2023 and 03/10/2023.  In the interim, she was evaluated by cardiology for atrial fibrillation and to discuss possible Watchman device in effort to avoid anticoagulation in the setting of GI bleeding.  She will need short term anticoagulation following placement of the device but risk outweighs benefit given she will not have to be on this long-term.  She will be on Eliquis 2.5 mg by mouth twice daily for approximately 6 months following placement.  She is scheduled for placement in mid November.  Had EGD on 02/01/2023 following a significant drop in her hemoglobin which showed a small hiatal hernia, gastric antral vascular ectasia without bleeding which was treated with ACP and single duodenal polyp that was resected.  She continues Eliquis 2.5 mg twice daily for atrial fibrillation and history of unprovoked DVT and PE back in 2020.  She continues daily iron tablets with mild constipation.  At today's visit, she reports feeling well.  Denies any additional bleeding.  Appetite is 100% energy levels are 75%.  Son reports he can notice a huge difference in her energy levels.  Denies any pain.  Has an occasional cough and shortness of breath.  Has an occasional headache.     ASSESSMENT & PLAN:  1.  Iron deficiency anemia secondary to GI blood loss - Normocytic anemia secondary  to chronic blood loss, may also have some anemia related to CKD stage IIIb - Hematology work-up (02/24/2022):  Ferritin 11, iron saturation 7%.  Normal folate, B12, MMA, copper. Reticulocytes 3.9% (consistent with acute blood loss), LDH normal, haptoglobin normal, DAT negative SPEP negative for monoclonal protein, but showed elevation in acute phase proteins - She has required intermittent blood transfusions, most recently on 02/24/2022 (Hgb 6.7) and again on 03/03/2022 (Hgb 6.6).  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  Normal duodenum and jejunum. - Colonoscopy (12/08/2019): Diverticulosis and polyps -Had EGD on 02/01/23 which showed small hiatal hernia, gastric antral vascular ectasia without bleeding that was treated with argon plasma coagulation (ACP) and a single duodenal polyp that was resected and retrieved - She denies any overt melena - She is on Eliquis 2.5 mg twice daily.  She takes iron tablet once daily.  - Most recent IV Feraheme on on 03/03/2023 and 03/10/2023. -She received a blood transfusion in ED on 01/27/23 for hemoglobin of 6.7.   2.   Unprovoked right leg DVT and PE: - Doppler on 04/03/2019: DVT in the femoral vein and calf veins. - CT chest PE protocol (04/03/2019): Moderate size pulmonary emboli in the right lower lobe. - She is on Eliquis 2.5 mg twice daily, also for Afib   3.  Social/family history: - She lives at home with her older son.  She takes care of the garden and does laundry and her ADLs and IADLs. She was a retired tobacco former.  Never smoked. - Sister had anemia.  Mother had uterine cancer and sister had colon cancer.   Plan: Iron deficiency anemia secondary to GI bleed: -Labs from 04/27/2023 show hemoglobin of 12.2 (9.2), ferritin 21 and iron saturation is 9%. -Recommend 2 additional doses of IV Feraheme. -Continue oral iron tablet with stool softener to avoid constipation. -Recommend returning to clinic in approximately 2 months  for follow-up and labs a few days before. -We discussed that although her hemoglobin has improved dramatically, her ferritin is still fairly low.  Would recommend ferritin between 50 and 100 and iron saturations between 20 and 30%.  2.  Atrial fibrillation: -She met with cardiology on 03/22/2023 to discuss placement of Watchman device in effort to avoid long-term anticoagulation in the setting of chronic GI bleeding. -She was deemed to be a good candidate and will have device placed in mid November. -She is currently on 2.5 mg Eliquis twice daily and will remain for approximately 6 months following the procedure.  At that point, she will be able to come off anticoagulation.    PLAN SUMMARY: >> IV Feraheme x 2 >> Labs in 2 months = CBC/D, ferritin, iron/TIBC, BMP >> RTC 1 week later for review of labs and assessment.     REVIEW OF SYSTEMS:   Review of Systems  Respiratory:  Positive for cough and shortness of breath.   Neurological:  Positive for headaches.     PHYSICAL EXAM:  ECOG PERFORMANCE STATUS: 2 - Symptomatic, <50% confined to bed  There were no vitals filed for this visit. There were no vitals filed for this visit. Physical Exam Constitutional:      Appearance: Normal appearance.  Cardiovascular:     Rate and Rhythm: Normal rate and regular rhythm.  Pulmonary:     Effort: Pulmonary effort is normal.     Breath sounds: Normal breath sounds.  Abdominal:     General: Bowel sounds are normal.     Palpations: Abdomen is soft.  Musculoskeletal:        General: No swelling. Normal range of motion.  Neurological:     Mental Status: She is alert and oriented to person, place, and time. Mental status is at baseline.     PAST MEDICAL/SURGICAL HISTORY:  Past Medical History:  Diagnosis Date   Anemia    Arthritis    Ostearthritis- hips, knees, fingers   Atrial fibrillation (HCC)    Basal cell carcinoma (BCC) of right hand 10/2021   Dx by [facility]   DVT (deep venous  thrombosis) (HCC)    Dyspnea    Dysrhythmia    GERD (gastroesophageal reflux disease)    Headache(784.0)    tx. Valproic acid   History of diabetes mellitus    History of kidney stones    Hypertension    Hypothyroidism    Pulmonary embolism [facility])    Past Surgical History:  Procedure Laterality Date   ABDOMINAL HYSTERECTOMY     BILATERAL HIP ARTHROSCOPY Left    BIOPSY  12/08/2019   Procedure: BIOPSY;  Surgeon: Corbin Ade, MD;  [facility];  Service: Endoscopy;;   CATARACT EXTRACTION, BILATERAL     COLONOSCOPY N/A 12/08/2019   polyps (tubular adenoma), diverticulosis, colonic lipoma, no surveillance due to age   CYSTOSCOPY W/ URETERAL STENT PLACEMENT Right 12/25/2020   Procedure: CYSTOSCOPY WITH RETROGRADE PYELOGRAM/URETERAL STENT PLACEMENT;  Surgeon: Jannifer Hick, MD;  Location: [facility];  Service: Urology;  Laterality: Right;   CYSTOSCOPY/URETEROSCOPY/HOLMIUM LASER/STENT PLACEMENT Right  01/15/2021   Procedure: CYSTOSCOPY/RETROGRADE/URETEROSCOPY/HOLMIUM LASER/STENT EXCHANGE;  Surgeon: Jannifer Hick, MD;  Location: [facility];  Service: Urology;  Laterality: Right;   ESOPHAGOGASTRODUODENOSCOPY N/A 12/08/2019   normal esophagus with 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   ESOPHAGOGASTRODUODENOSCOPY (EGD) WITH PROPOFOL N/A 05/18/2021   Surgeon: Vida Rigger, MD;   Tiny hiatal hernia, gastric antral vascular ectasia s/p ablation, normal duodenum, normal jejunum.   ESOPHAGOGASTRODUODENOSCOPY (EGD) WITH PROPOFOL N/A 03/10/2022   Procedure: ESOPHAGOGASTRODUODENOSCOPY (EGD) WITH PROPOFOL;  Surgeon: Lanelle Bal, DO;  [facility];  Service: Endoscopy;  Laterality: N/A;  10:45am   ESOPHAGOGASTRODUODENOSCOPY (EGD) WITH PROPOFOL N/A 02/01/2023   Procedure: ESOPHAGOGASTRODUODENOSCOPY (EGD) WITH PROPOFOL;  Surgeon: Lanelle Bal, DO;  [facility];  Service: Endoscopy;  Laterality: N/A;  9:00 am, asa 3   GIVENS CAPSULE  STUDY N/A 01/13/2020   Procedure: GIVENS CAPSULE STUDY;  Surgeon: Corbin Ade, MD;  [facility];  Service: Endoscopy;  Laterality: N/A;  7:30am   GIVENS CAPSULE STUDY N/A 05/04/2021   Procedure: GIVENS CAPSULE STUDY;  Surgeon: Vida Rigger, MD;  [facility]: [facility];  Service: Endoscopy;  Laterality: N/A;   MULTIPLE TOOTH EXTRACTIONS     60's   PARATHYROIDECTOMY     POLYPECTOMY  12/08/2019   Procedure: POLYPECTOMY;  Surgeon: Corbin Ade, MD;  [facility];  Service: Endoscopy;;   POLYPECTOMY  02/01/2023   Procedure: POLYPECTOMY;  Surgeon: Lanelle Bal, DO;  [facility];  Service: Endoscopy;;   RADIOFREQUENCY ABLATION  05/18/2021   Procedure: RADIO FREQUENCY ABLATION;  Surgeon: Vida Rigger, MD;  [facility]: [facility];  Service: Endoscopy;;   TEE WITHOUT CARDIOVERSION N/A 07/06/2021   Procedure: TRANSESOPHAGEAL ECHOCARDIOGRAM (TEE);  Surgeon: Little Ishikawa, MD;  Location: [facility] ENDOSCOPY;  Service: Cardiovascular;  Laterality: N/A;   TOTAL HIP ARTHROPLASTY Right 10/29/2013   Procedure: RIGHT TOTAL HIP ARTHROPLASTY ANTERIOR APPROACH;  Surgeon: Shelda Pal, MD;  Location: [facility];  Service: Orthopedics;  Laterality: Right;    SOCIAL HISTORY:  Social History   Socioeconomic History   Marital status: Widowed    Spouse name: Not on file   Number of children: 4   Years of education: 34   Highest education level: 12th grade  Occupational History   Occupation: Retired    Comment: Tobacco Farming  Tobacco Use   Smoking status: Never   Smokeless tobacco: Never  Vaping Use   Vaping status: Never Used  Substance and Sexual Activity   Alcohol use: No    Alcohol/week: 0.0 standard drinks of alcohol   Drug use: No   Sexual activity: Not Currently  Other Topics Concern   Not on file  Social History Narrative   Patient is widowed and lives in a one story home. She has four adult children and one son lives with her.       Live on a  farm, grew up on a farm   Social Determinants of Health   Financial Resource Strain: Low Risk  (12/27/2022)   Overall Financial Resource Strain (CARDIA)    Difficulty of Paying Living Expenses: Not hard at all  Food Insecurity: No Food Insecurity (12/27/2022)   Hunger Vital Sign    Worried About Running Out of Food in the Last Year: Never true    Ran Out of Food in the Last Year: Never true  Transportation Needs: No Transportation Needs (12/27/2022)   PRAPARE - Transportation    Lack  of Transportation (Medical): No    Lack of Transportation (Non-Medical): No  Physical Activity: Insufficiently Active (12/27/2022)   Exercise Vital Sign    Days of Exercise per Week: 3 days    Minutes of Exercise per Session: 30 min  Stress: No Stress Concern Present (12/27/2022)   [employer] of Occupational Health - Occupational Stress Questionnaire    Feeling of Stress : Not at all  Social Connections: Moderately Isolated (12/27/2022)   Social Connection and Isolation Panel [NHANES]    Frequency of Communication with Friends and Family: More than three times a week    Frequency of Social Gatherings with Friends and Family: More than three times a week    Attends Religious Services: More than 4 times per year    Active Member of [employer] or Organizations: No    Attends Banker Meetings: Never    Marital Status: Widowed  Intimate Partner Violence: Not At Risk (12/27/2022)   Humiliation, Afraid, Rape, and Kick questionnaire    Fear of Current or Ex-Partner: No    Emotionally Abused: No    Physically Abused: No    Sexually Abused: No    FAMILY HISTORY:  Family History  Problem Relation Age of Onset   Colitis Sister 49       alive   Cancer Sister 47       colon   Cancer Mother 2       uterine   Heart disease Father 8       heart failure   Heart attack Brother 23   Healthy Daughter    Healthy Son    Pulmonary embolism Brother 46   Heart disease Brother    Healthy Son    Healthy Son      CURRENT MEDICATIONS:  Outpatient Encounter Medications as of 05/05/2023  Medication Sig   acetaminophen (TYLENOL) 325 MG tablet Take 2 tablets (650 mg total) by mouth every 6 (six) hours as needed for mild pain or headache (or Fever >/= 101).   allopurinol (ZYLOPRIM) 100 MG tablet Take 1 tablet (100 mg total) by mouth daily.   amLODipine (NORVASC) 5 MG tablet Take 1 tablet (5 mg total) by mouth daily.   apixaban (ELIQUIS) 2.5 MG TABS tablet Take 1 tablet (2.5 mg total) by mouth 2 (two) times daily.   atorvastatin (LIPITOR) 20 MG tablet Take 1 tablet by mouth once daily   dapagliflozin propanediol (FARXIGA) 10 MG TABS tablet Take 1 tablet (10 mg total) by mouth daily.   Ferrous Sulfate (IRON PO) Take 1 tablet by mouth daily.   furosemide (LASIX) 20 MG tablet Take 1 tablet by mouth once daily   GNP CRANBERRY EXTRACT PO Take 500 mg by mouth in the morning.   levothyroxine (SYNTHROID) 50 MCG tablet TAKE 1 TABLET BY MOUTH ONCE DAILY BEFORE BREAKFAST   loratadine (CLARITIN) 10 MG tablet Take 10 mg by mouth daily.   losartan (COZAAR) 25 MG tablet Take 1 tablet (25 mg total) by mouth daily.   oxybutynin (DITROPAN-XL) 10 MG 24 hr tablet Take 1 tablet (10 mg total) by mouth at bedtime.   pantoprazole (PROTONIX) 40 MG tablet Take 1 tablet (40 mg total) by mouth 2 (two) times daily before a meal.   vitamin B-12 (CYANOCOBALAMIN) 50 MCG tablet Take 50 mcg by mouth daily.   No facility-administered encounter medications on file as of 05/05/2023.    ALLERGIES:  No Known Allergies  LABORATORY DATA:  I have reviewed the labs as listed.  CBC    Component Value Date/Time   WBC 4.6 04/27/2023 1044   RBC 4.00 04/27/2023 1044   HGB 12.2 04/27/2023 1044   HGB 9.4 (L) 12/02/2022 0957   HCT 38.8 04/27/2023 1044   HCT 29.6 (L) 12/02/2022 0957   PLT 202 04/27/2023 1044   PLT 206 12/02/2022 0957   MCV 97.0 04/27/2023 1044   MCV 99 (H) 12/02/2022 0957   MCH 30.5 04/27/2023 1044   MCHC 31.4  04/27/2023 1044   RDW 14.9 04/27/2023 1044   RDW 12.7 12/02/2022 0957   LYMPHSABS 1.1 02/23/2023 0748   LYMPHSABS 0.9 12/02/2022 0957   MONOABS 0.5 02/23/2023 0748   EOSABS 0.1 02/23/2023 0748   EOSABS 0.1 12/02/2022 0957   BASOSABS 0.0 02/23/2023 0748   BASOSABS 0.0 12/02/2022 0957      Latest Ref Rng & Units 04/27/2023   10:44 AM 04/20/2023   10:28 AM 01/31/2023    2:26 PM  CMP  Glucose 70 - 99 mg/dL 914  782  956   BUN 8 - 23 mg/dL 26  27  34   Creatinine 0.44 - 1.00 mg/dL 2.13  0.86  5.78   Sodium 135 - 145 mmol/L 138  142  137   Potassium 3.5 - 5.1 mmol/L 4.1  4.2  4.4   Chloride 98 - 111 mmol/L 106  108  110   CO2 22 - 32 mmol/L 22  20  21    Calcium 8.9 - 10.3 mg/dL 46.9  62.9  52.8   Total Protein 6.0 - 8.5 g/dL  6.1    Total Bilirubin 0.0 - 1.2 mg/dL  0.2    Alkaline Phos 44 - 121 IU/L  115    AST 0 - 40 IU/L  14    ALT 0 - 32 IU/L  11      DIAGNOSTIC IMAGING:  I have independently reviewed the relevant imaging and discussed with the patient.   WRAP UP:  All questions were answered. The patient knows to call the clinic with any problems, questions or concerns.  Medical decision making: Moderate  Time spent on visit: I spent 25 minutes dedicated to the care of this patient (face-to-face and non-face-to-face) on the date of the encounter to include what is described in the assessment and plan.   Mauro Kaufmann, NP  09/27/2022 11:03 AM

## 2023-05-09 ENCOUNTER — Inpatient Hospital Stay: Payer: PPO

## 2023-05-09 VITALS — BP 153/64 | HR 41 | Temp 97.7°F | Resp 18

## 2023-05-09 DIAGNOSIS — D500 Iron deficiency anemia secondary to blood loss (chronic): Secondary | ICD-10-CM | POA: Diagnosis not present

## 2023-05-09 MED ORDER — CETIRIZINE HCL 10 MG PO TABS: 10.0000 mg | ORAL_TABLET | Freq: Once | ORAL | Status: DC

## 2023-05-09 MED ORDER — ACETAMINOPHEN 325 MG PO TABS: 650.0000 mg | ORAL_TABLET | Freq: Once | ORAL | Status: DC

## 2023-05-09 MED ORDER — SODIUM CHLORIDE 0.9 % IV SOLN: Freq: Once | INTRAVENOUS | Status: AC

## 2023-05-09 MED ORDER — SODIUM CHLORIDE 0.9 % IV SOLN
510.0000 mg | Freq: Once | INTRAVENOUS | Status: AC
  Administered 2023-05-09: 510 mg via INTRAVENOUS
  Filled 2023-05-09: qty 17

## 2023-05-09 NOTE — Patient Instructions (Signed)
Ferumoxytol Injection What is this medication? FERUMOXYTOL (FER ue MOX i tol) treats low levels of iron in your body (iron deficiency anemia). Iron is a mineral that plays an important role in making red blood cells, which carry oxygen from your lungs to the rest of your body. This medicine may be used for other purposes; ask your health care provider or pharmacist if you have questions. COMMON BRAND NAME(S): Feraheme What should I tell my care team before I take this medication? They need to know if you have any of these conditions: Anemia not caused by low iron levels High levels of iron in the blood Magnetic resonance imaging (MRI) test scheduled An unusual or allergic reaction to iron, other medications, foods, dyes, or preservatives Pregnant or trying to get pregnant Breastfeeding How should I use this medication? This medication is injected into a vein. It is given by your care team in a hospital or clinic setting. Talk to your care team the use of this medication in children. Special care may be needed. Overdosage: If you think you have taken too much of this medicine contact a poison control center or emergency room at once. NOTE: This medicine is only for you. Do not share this medicine with others. What if I miss a dose? It is important not to miss your dose. Call your care team if you are unable to keep an appointment. What may interact with this medication? Other iron products This list may not describe all possible interactions. Give your health care provider a list of all the medicines, herbs, non-prescription drugs, or dietary supplements you use. Also tell them if you smoke, drink alcohol, or use illegal drugs. Some items may interact with your medicine. What should I watch for while using this medication? Visit your care team regularly. Tell your care team if your symptoms do not start to get better or if they get worse. You may need blood work done while you are taking this  medication. You may need to follow a special diet. Talk to your care team. Foods that contain iron include: whole grains/cereals, dried fruits, beans, or peas, leafy green vegetables, and organ meats (liver, kidney). What side effects may I notice from receiving this medication? Side effects that you should report to your care team as soon as possible: Allergic reactions--skin rash, itching, hives, swelling of the face, lips, tongue, or throat Low blood pressure--dizziness, feeling faint or lightheaded, blurry vision Shortness of breath Side effects that usually do not require medical attention (report to your care team if they continue or are bothersome): Flushing Headache Joint pain Muscle pain Nausea Pain, redness, or irritation at injection site This list may not describe all possible side effects. Call your doctor for medical advice about side effects. You may report side effects to FDA at 1-800-FDA-1088. Where should I keep my medication? This medication is given in a hospital or clinic. It will not be stored at home. NOTE: This sheet is a summary. It may not cover all possible information. If you have questions about this medicine, talk to your doctor, pharmacist, or health care provider.  2024 Elsevier/Gold Standard (2022-12-16 00:00:00)

## 2023-05-09 NOTE — Progress Notes (Signed)
Patient took own Loratadine and tylenol from home.

## 2023-05-09 NOTE — Progress Notes (Signed)
Patient tolerated iron infusion with no complaints voiced.  Peripheral IV site clean and dry with good blood return noted before and after infusion.  VSS with discharge and left in satisfactory condition with no s/s of distress noted. Pt refused to wait 30 minutes after iron infusion. Pt educated pt on the importance of notifying the clinic if complications occur, pt verbalized understanding. Pt escorted out via wheelchair by son. Pt stable at discharge.   Luccia Reinheimer [employer]

## 2023-05-17 NOTE — Telephone Encounter (Signed)
Mailed az&me renewal application to patient home.

## 2023-05-18 ENCOUNTER — Inpatient Hospital Stay: Payer: PPO

## 2023-05-18 VITALS — BP 155/53 | HR 41 | Temp 97.2°F | Resp 18 | Wt 200.0 lb

## 2023-05-18 DIAGNOSIS — D500 Iron deficiency anemia secondary to blood loss (chronic): Secondary | ICD-10-CM

## 2023-05-18 MED ORDER — SODIUM CHLORIDE 0.9 % IV SOLN
Freq: Once | INTRAVENOUS | Status: AC
Start: 2023-05-18 — End: 2023-05-18

## 2023-05-18 MED ORDER — SODIUM CHLORIDE 0.9 % IV SOLN
510.0000 mg | Freq: Once | INTRAVENOUS | Status: AC
  Administered 2023-05-18: 510 mg via INTRAVENOUS
  Filled 2023-05-18: qty 510

## 2023-05-18 MED ORDER — CETIRIZINE HCL 10 MG PO TABS
10.0000 mg | ORAL_TABLET | Freq: Once | ORAL | Status: DC
Start: 2023-05-18 — End: 2023-05-18

## 2023-05-18 MED ORDER — ACETAMINOPHEN 325 MG PO TABS
650.0000 mg | ORAL_TABLET | Freq: Once | ORAL | Status: DC
Start: 2023-05-18 — End: 2023-05-18

## 2023-05-18 NOTE — Patient Instructions (Signed)
 Ferumoxytol Injection What is this medication? FERUMOXYTOL (FER ue MOX i tol) treats low levels of iron in your body (iron deficiency anemia). Iron is a mineral that plays an important role in making red blood cells, which carry oxygen from your lungs to the rest of your body. This medicine may be used for other purposes; ask your health care provider or pharmacist if you have questions. COMMON BRAND NAME(S): Feraheme What should I tell my care team before I take this medication? They need to know if you have any of these conditions: Anemia not caused by low iron levels High levels of iron in the blood Magnetic resonance imaging (MRI) test scheduled An unusual or allergic reaction to iron, other medications, foods, dyes, or preservatives Pregnant or trying to get pregnant Breastfeeding How should I use this medication? This medication is injected into a vein. It is given by your care team in a hospital or clinic setting. Talk to your care team the use of this medication in children. Special care may be needed. Overdosage: If you think you have taken too much of this medicine contact a poison control center or emergency room at once. NOTE: This medicine is only for you. Do not share this medicine with others. What if I miss a dose? It is important not to miss your dose. Call your care team if you are unable to keep an appointment. What may interact with this medication? Other iron products This list may not describe all possible interactions. Give your health care provider a list of all the medicines, herbs, non-prescription drugs, or dietary supplements you use. Also tell them if you smoke, drink alcohol, or use illegal drugs. Some items may interact with your medicine. What should I watch for while using this medication? Visit your care team regularly. Tell your care team if your symptoms do not start to get better or if they get worse. You may need blood work done while you are taking this  medication. You may need to follow a special diet. Talk to your care team. Foods that contain iron include: whole grains/cereals, dried fruits, beans, or peas, leafy green vegetables, and organ meats (liver, kidney). What side effects may I notice from receiving this medication? Side effects that you should report to your care team as soon as possible: Allergic reactions--skin rash, itching, hives, swelling of the face, lips, tongue, or throat Low blood pressure--dizziness, feeling faint or lightheaded, blurry vision Shortness of breath Side effects that usually do not require medical attention (report to your care team if they continue or are bothersome): Flushing Headache Joint pain Muscle pain Nausea Pain, redness, or irritation at injection site This list may not describe all possible side effects. Call your doctor for medical advice about side effects. You may report side effects to FDA at 1-800-FDA-1088. Where should I keep my medication? This medication is given in a hospital or clinic. It will not be stored at home. NOTE: This sheet is a summary. It may not cover all possible information. If you have questions about this medicine, talk to your doctor, pharmacist, or health care provider.  2024 Elsevier/Gold Standard (2022-12-16 00:00:00)

## 2023-05-18 NOTE — Progress Notes (Signed)
Patient tolerated iron infusion with no complaints voiced.  Peripheral IV site clean and dry with good blood return noted before and after infusion.  VSS with discharge and left in satisfactory condition with no s/s of distress noted. Pt refused to wait 30 minutes after iron infusion. Pt educated pt on the importance of notifying the clinic if complications occur, pt verbalized understanding. Pt escorted out via wheelchair by daughter. Pt stable at discharge   Sharyon Medicus

## 2023-05-26 IMAGING — DX DG CHEST 2V
2 series · 2 of 2 positions shown · non-contrast
Comparison: 04/03/2019

CLINICAL DATA: Shortness of breath, chest pain

EXAM:
CHEST - 2 VIEW

[chest lat]
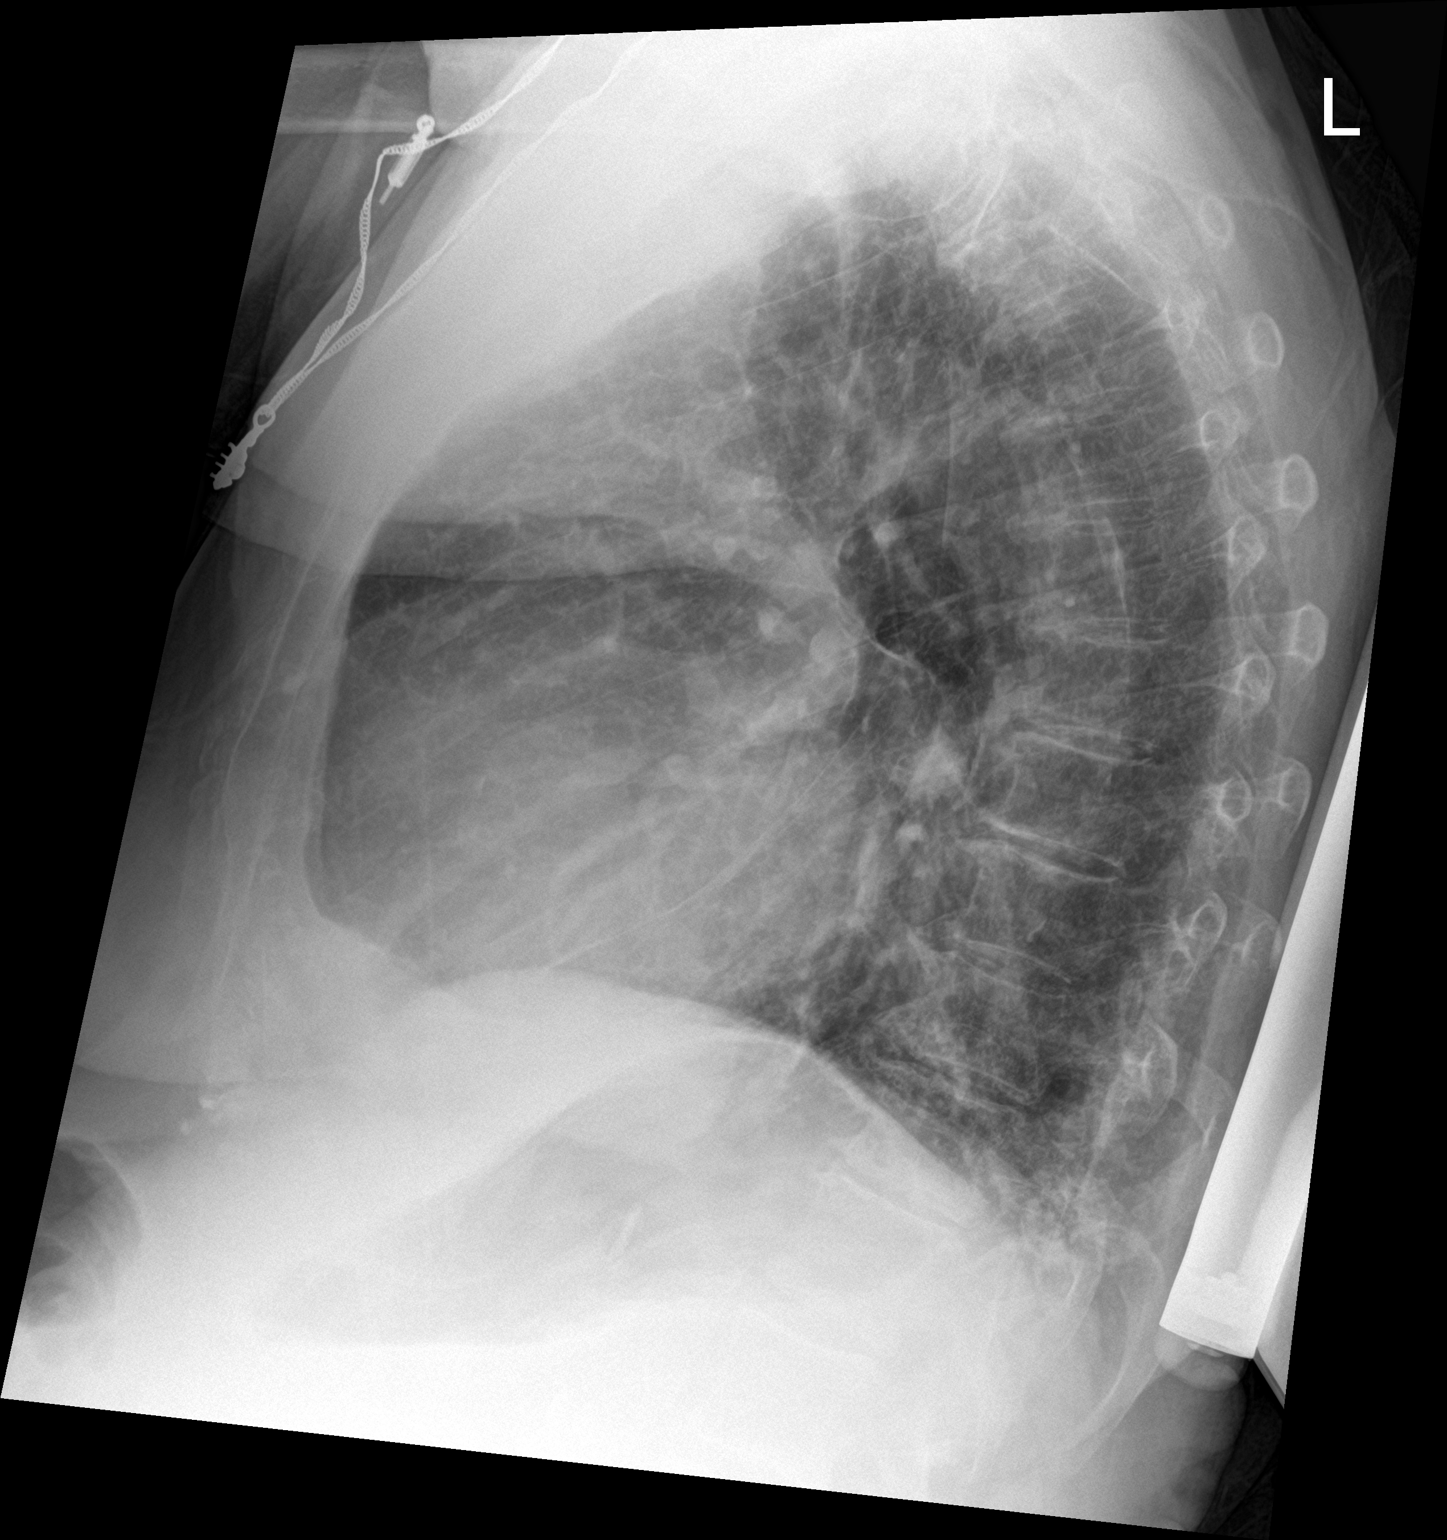

[chest ap]
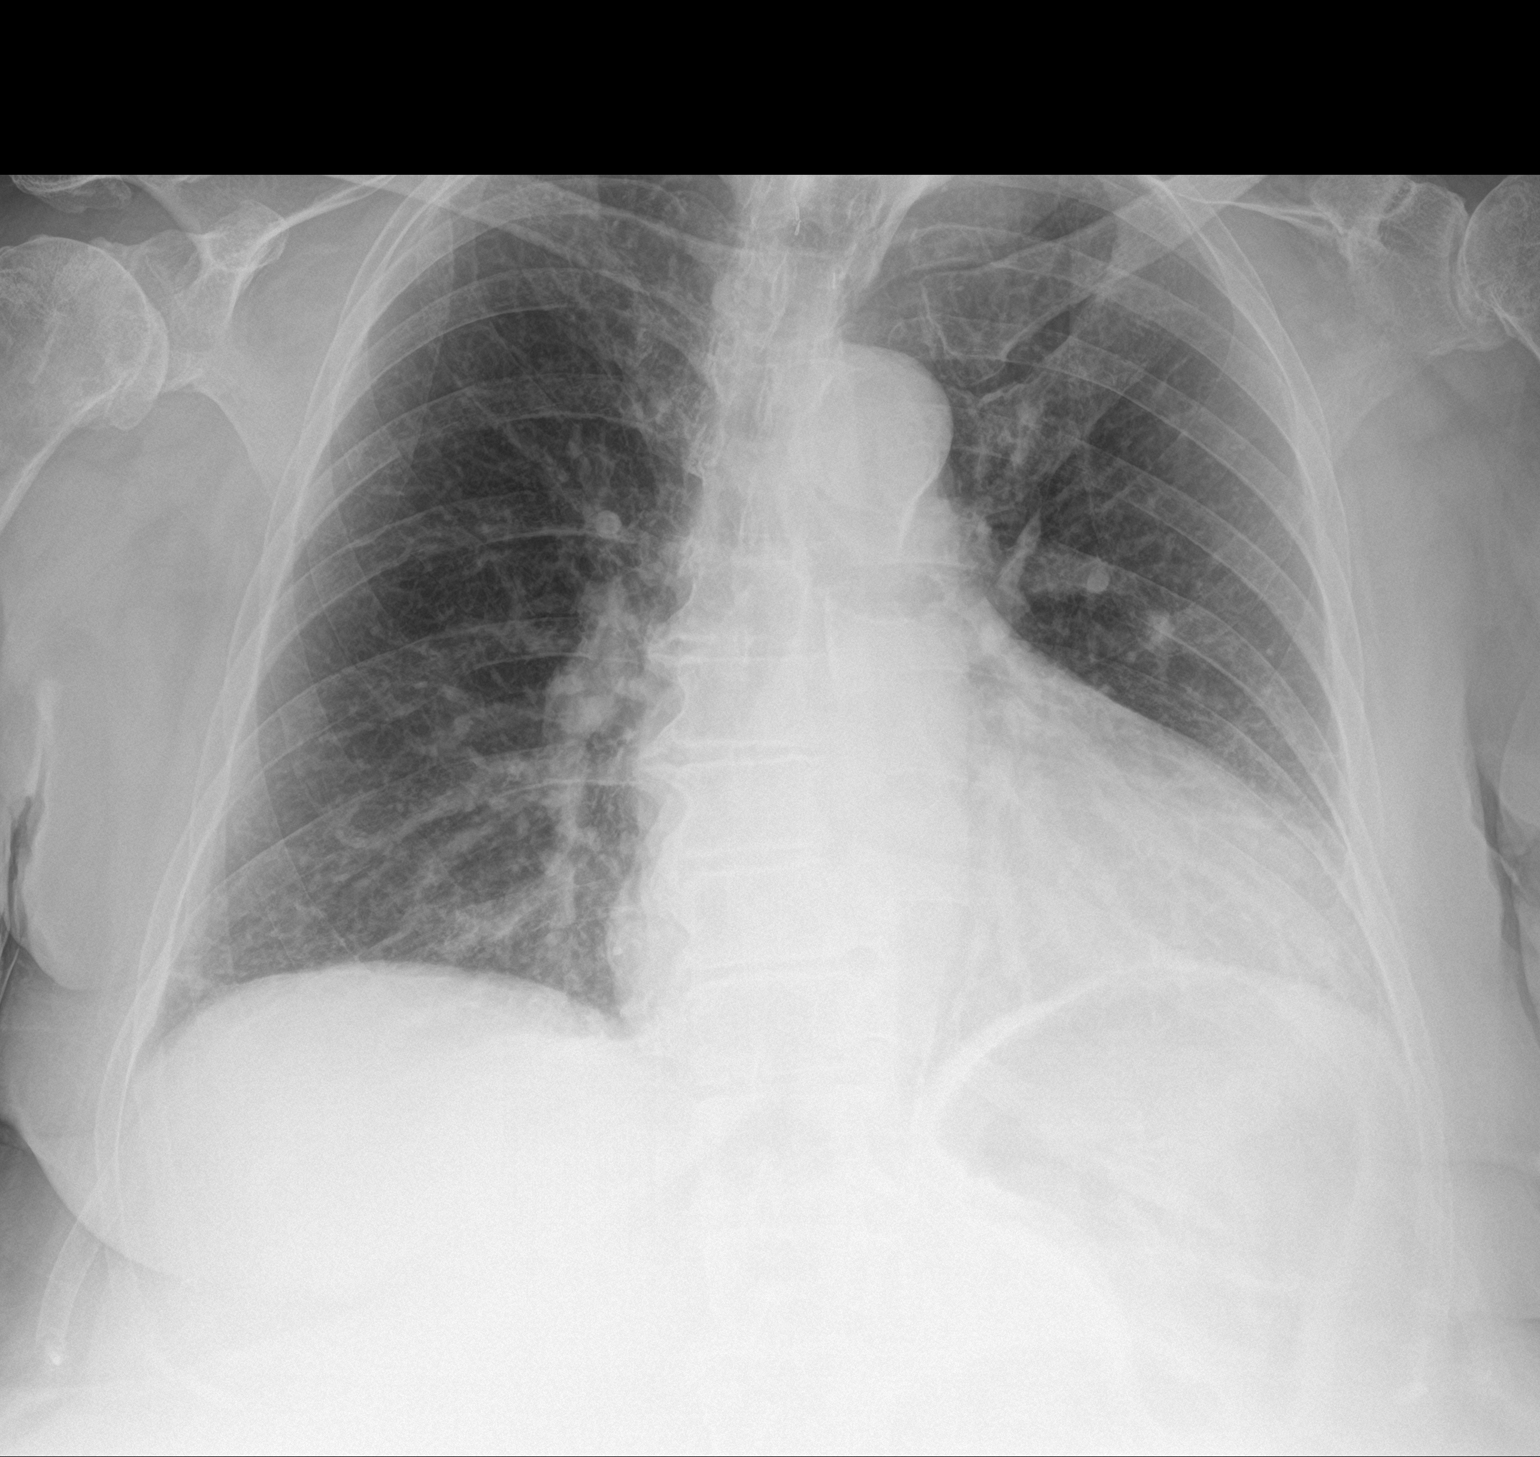

[2 of 2 positions shown; findings below may reference images not displayed]

FINDINGS: Cardiomegaly. Aortic atherosclerosis. No confluent opacities or
effusions. No acute bony abnormality.
IMPRESSION: Mild cardiomegaly.  No active disease.

## 2023-05-27 IMAGING — US US RENAL
1 series · 15 of 25 positions shown · non-contrast
Comparison: CT 12/24/2020, sonogram 01/08/2020

CLINICAL DATA: Acute renal failure

EXAM:
RENAL / URINARY TRACT ULTRASOUND COMPLETE

[Series 1: us renal mc & wl · 15 of 53 slices shown]
[im 1/53]
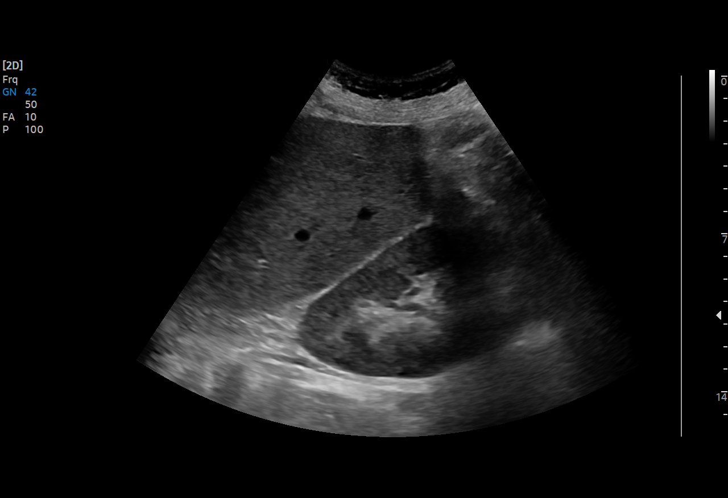
[im 5/53]
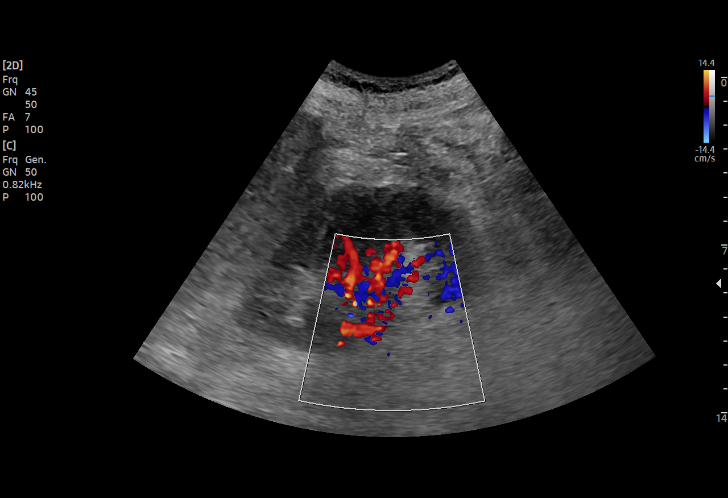
[im 9/53]
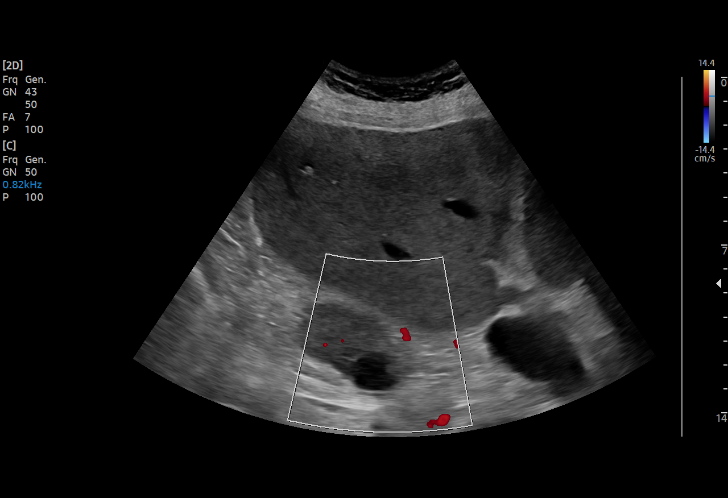
[im 11/53]
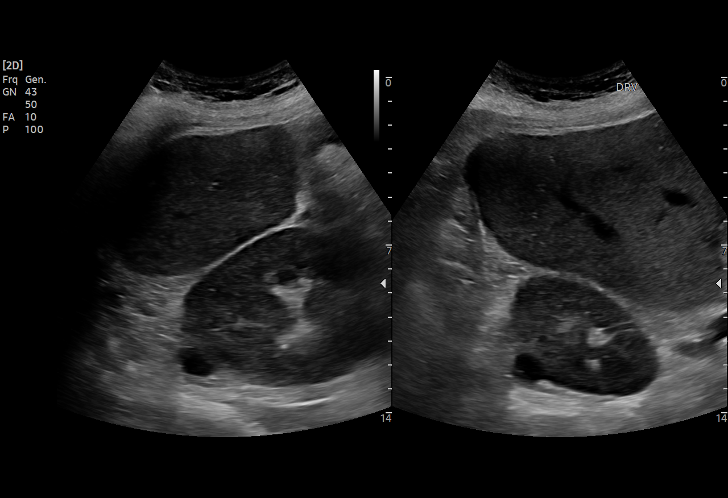
[im 16/53]
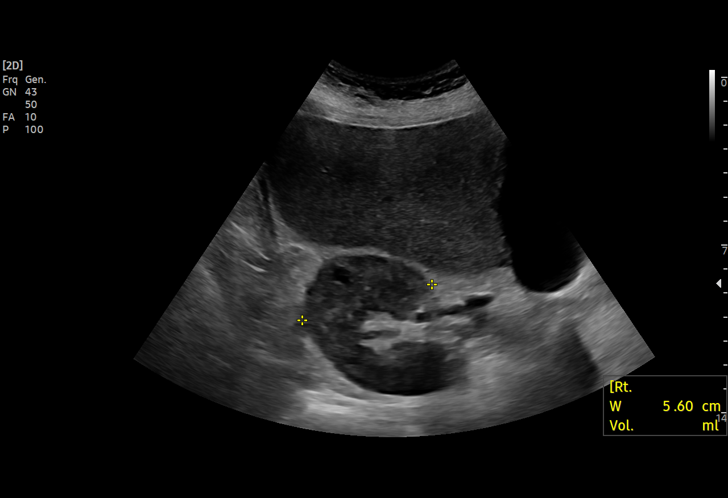
[im 20/53]
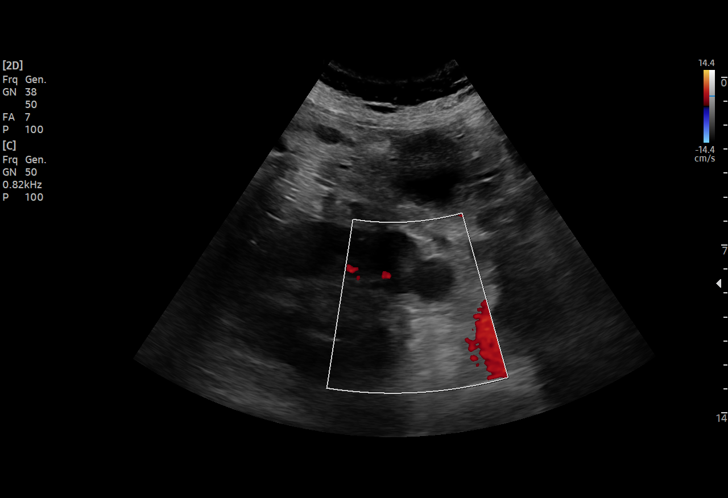
[im 22/53]
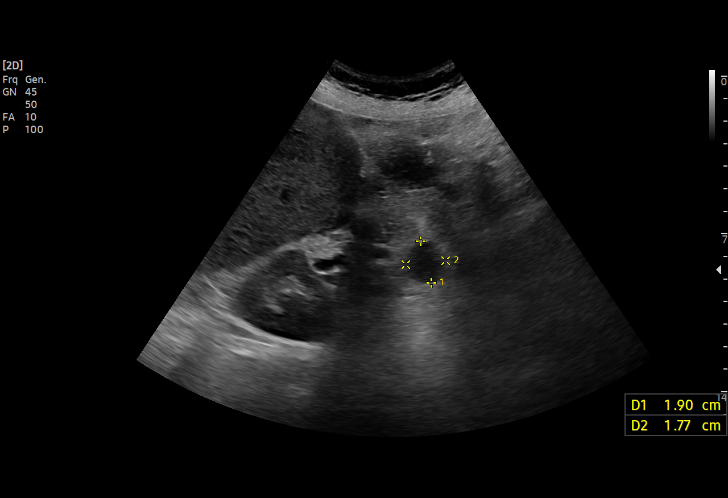
[im 27/53]
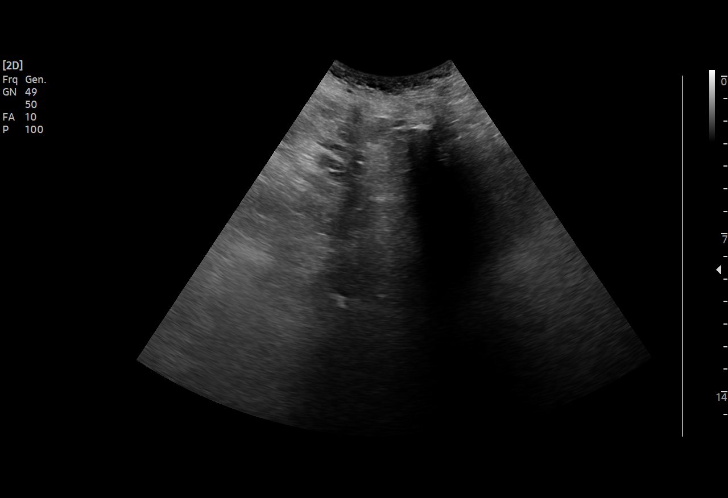
[im 31/53]
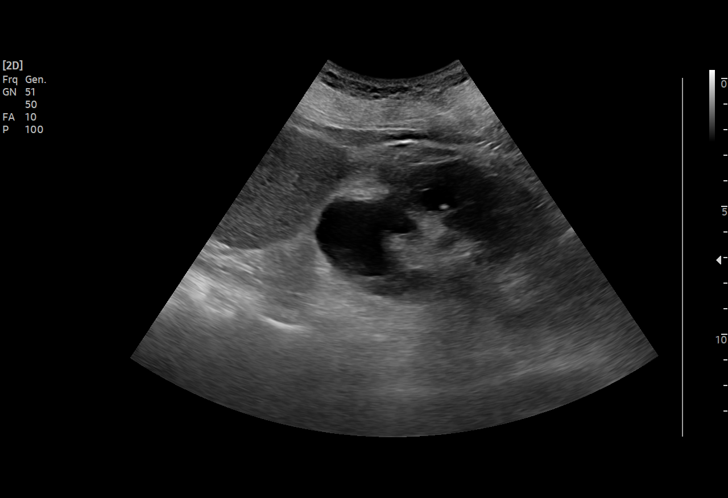
[im 33/53]
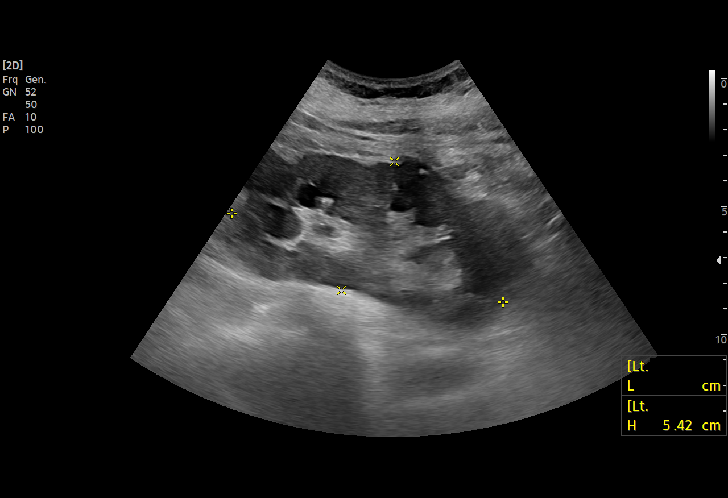
[im 37/53]
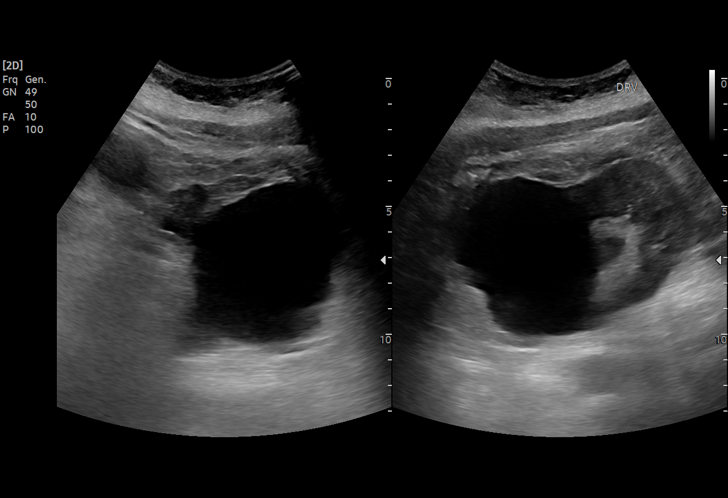
[im 42/53]
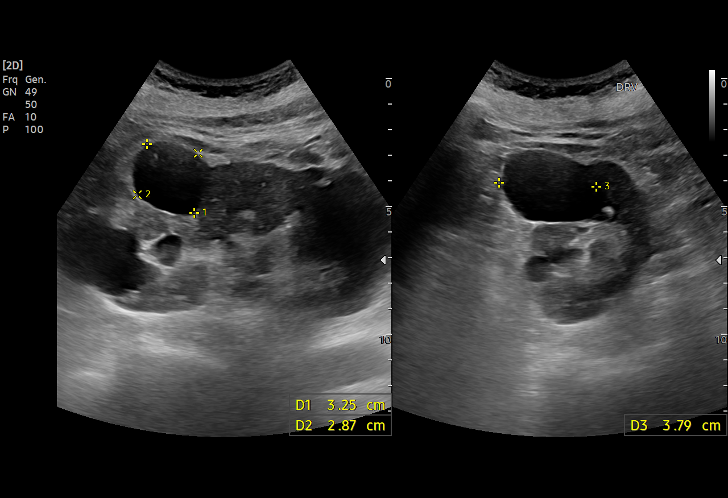
[im 44/53]
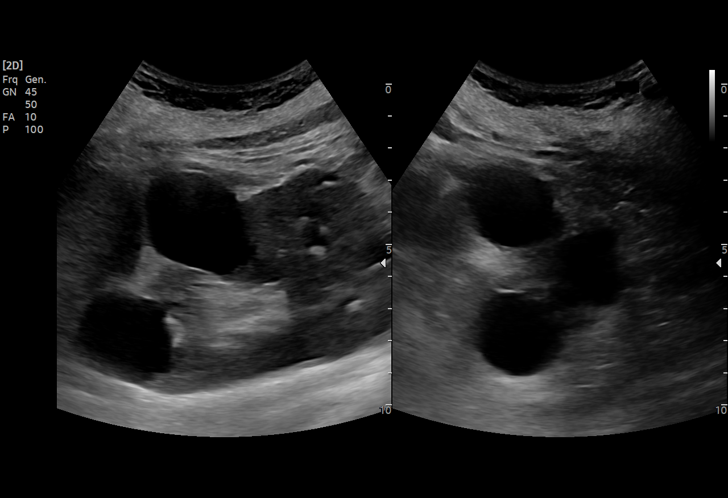
[im 48/53]
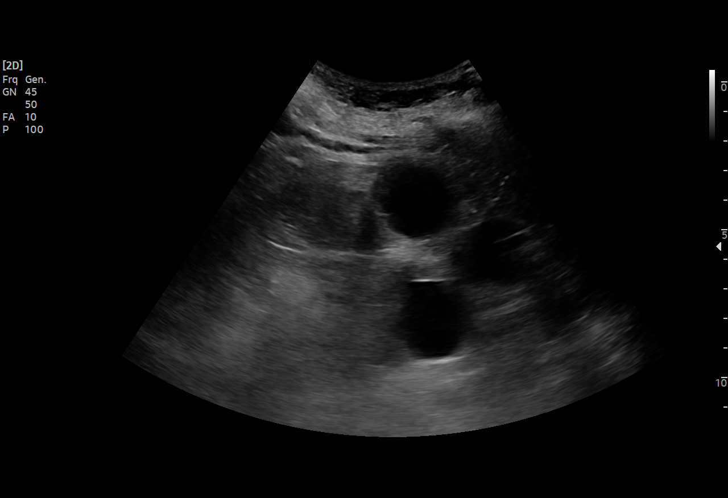
[im 53/53]
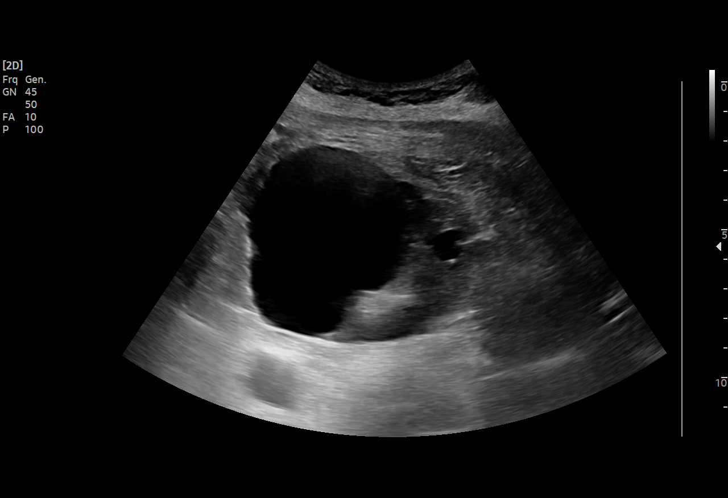

[15 of 25 positions shown; findings below may reference images not displayed]

FINDINGS: Right Kidney:

Renal measurements: 11.3 x 6.7 x 5.6 cm = volume: 219 mL. Renal
cortical thickness is preserved and cortical echogenicity is within
normal limits. Previously noted hydronephrosis has resolved. No
intrarenal masses or calcifications are seen. Multiple simple
cortical cysts are identified measuring up to 18 mm with interpolar
region.

Left Kidney:

Renal measurements: 11.1 x 5.4 x 5.1 cm = volume: 161 mL. Renal
cortical thickness is preserved and cortical echogenicity is within
normal limits. No intrarenal masses or calcifications are seen.
Multiple simple cortical cysts are identified measuring up to 6.7 cm
in greatest dimension within the lower pole. No hydronephrosis.

Bladder:

The bladder is decompressed with a Foley catheter balloon seen
within its lumen.

Other:

None.
IMPRESSION: Interval resolution of previously noted hydronephrosis. Multiple
simple cortical cysts bilaterally, benign. Otherwise unremarkable
renal sonogram.

## 2023-06-01 ENCOUNTER — Other Ambulatory Visit: Payer: PPO

## 2023-06-01 IMAGING — DX DG CHEST 1V PORT
1 series · 1 of 1 positions shown · non-contrast
Comparison: 12/24/2020.

CLINICAL DATA: Back pain.  History of septic shock.

EXAM:
PORTABLE CHEST 1 VIEW

[chest ap]
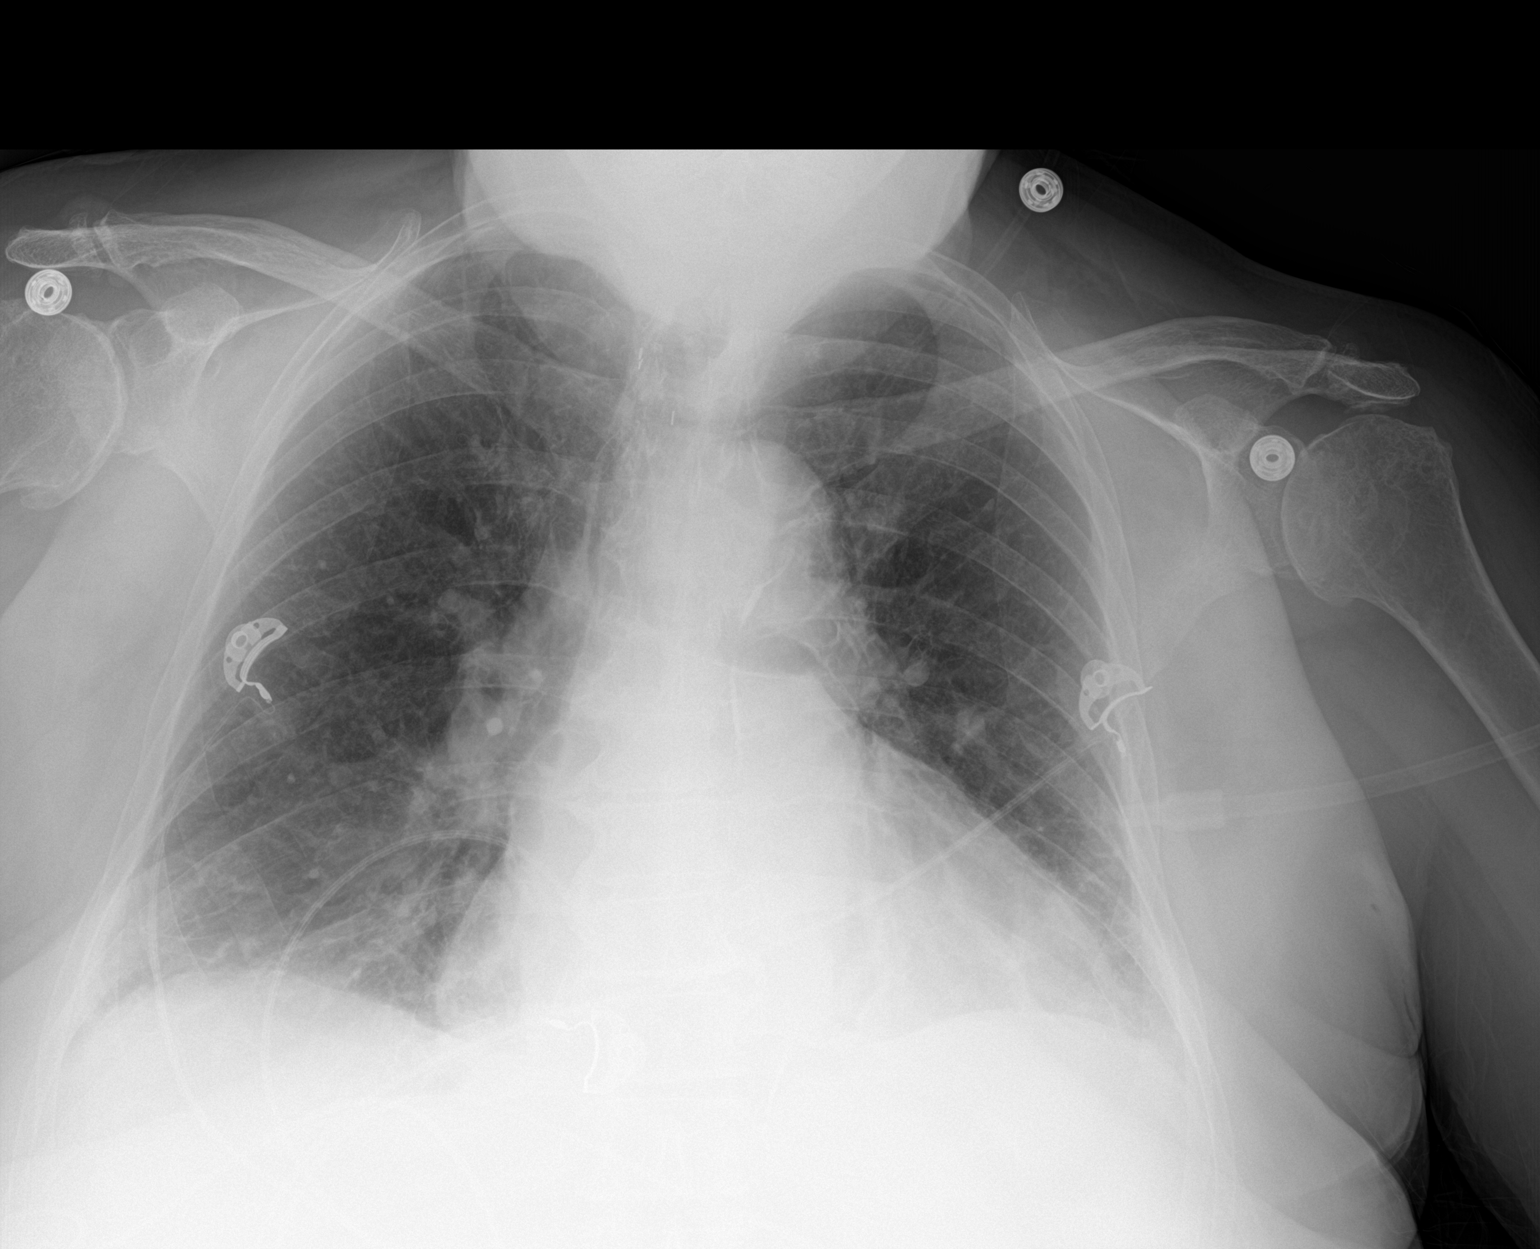

[1 of 1 positions shown; findings below may reference images not displayed]

FINDINGS: Stable cardiomegaly. No pulmonary venous congestion. Low lung
volumes with persistent bibasilar atelectasis. No pleural effusion
or pneumothorax. Degenerative change thoracic spine. No acute bony
abnormality.
IMPRESSION: 1.  Stable cardiomegaly.  No pulmonary venous congestion.

2.  Low lung volumes with persistent bibasilar atelectasis.

## 2023-06-02 NOTE — Progress Notes (Unsigned)
[facility]                                     Cardiology Office Note:    Date:  06/02/2023   ID:  [name] [name], DOB Dec 11, 1940, MRN [id number]  PCP:  Gabriel Earing, FNP  [facility] Cardiologist:  Rollene Rotunda, MD  [facility] Electrophysiologist:  None   Referring MD: Gabriel Earing, FNP   No chief complaint on file. ***  History of Present Illness:    [name] [name] is a 82 y.o. female with a hx of chronic systolic CHF with LV normalization, mild/mod MR with mod MAC by recent echo, HTN, iron deficiency anemia, CKD stage IIIa, and persistent atrial fibrillation referred for LAAO due to hx of GI bleeding.   [name] [name] is followed by Dr. Antoine Poche for her cardiology care. She was last seen 03/08/23. At that time, she was not felt to be a good long term anticoagulation candidate and was referred to Dr. Lalla Brothers for consideration of LAAO with [facility].   She was seen with a plan to proceed with implant. CT deferred given hx of CKD stage IIIa.   Today she is here   Persistent atrial fibrillation:  Chronic systolic HF: Last echocardiogram with EF normalization. Patient with NYHA class I symptoms and no evidence of volume overload on exam today. Continue   Mild/mod MR with MAC: Continue to follow with surveillance imaging  HTN:   IDA:   CKD stage IIIa: .      Past Medical History:  Diagnosis Date   Anemia    Arthritis    Ostearthritis- hips, knees, fingers   Atrial fibrillation (HCC)    Basal cell carcinoma (BCC) of right hand 10/2021   Dx by [facility]   DVT (deep venous thrombosis) (HCC)    Dyspnea    Dysrhythmia    GERD (gastroesophageal reflux disease)    Headache(784.0)    tx. Valproic acid   History of diabetes mellitus    History of kidney stones    Hypertension    Hypothyroidism    Pulmonary embolism [facility])     Past Surgical History:  Procedure Laterality Date   ABDOMINAL HYSTERECTOMY     BILATERAL HIP  ARTHROSCOPY Left    BIOPSY  12/08/2019   Procedure: BIOPSY;  Surgeon: Corbin Ade, MD;  [facility];  Service: Endoscopy;;   CATARACT EXTRACTION, BILATERAL     COLONOSCOPY N/A 12/08/2019   polyps (tubular adenoma), diverticulosis, colonic lipoma, no surveillance due to age   CYSTOSCOPY W/ URETERAL STENT PLACEMENT Right 12/25/2020   Procedure: CYSTOSCOPY WITH RETROGRADE PYELOGRAM/URETERAL STENT PLACEMENT;  Surgeon: Jannifer Hick, MD;  Location: [facility];  Service: Urology;  Laterality: Right;   CYSTOSCOPY/URETEROSCOPY/HOLMIUM LASER/STENT PLACEMENT Right 01/15/2021   Procedure: CYSTOSCOPY/RETROGRADE/URETEROSCOPY/HOLMIUM LASER/STENT EXCHANGE;  Surgeon: Jannifer Hick, MD;  Location: [facility];  Service: Urology;  Laterality: Right;   ESOPHAGOGASTRODUODENOSCOPY N/A 12/08/2019   normal esophagus with 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   ESOPHAGOGASTRODUODENOSCOPY (EGD) WITH PROPOFOL N/A 05/18/2021   Surgeon: Vida Rigger, MD;   Tiny hiatal hernia, gastric antral vascular ectasia s/p ablation, normal duodenum, normal jejunum.   ESOPHAGOGASTRODUODENOSCOPY (EGD) WITH PROPOFOL N/A 03/10/2022   Procedure: ESOPHAGOGASTRODUODENOSCOPY (EGD) WITH PROPOFOL;  Surgeon: Lanelle Bal, DO;  [facility];  Service: Endoscopy;  Laterality: N/A;  10:45am  ESOPHAGOGASTRODUODENOSCOPY (EGD) WITH PROPOFOL N/A 02/01/2023   Procedure: ESOPHAGOGASTRODUODENOSCOPY (EGD) WITH PROPOFOL;  Surgeon: Lanelle Bal, DO;  [facility];  Service: Endoscopy;  Laterality: N/A;  9:00 am, asa 3   GIVENS CAPSULE STUDY N/A 01/13/2020   Procedure: GIVENS CAPSULE STUDY;  Surgeon: Corbin Ade, MD;  [facility];  Service: Endoscopy;  Laterality: N/A;  7:30am   GIVENS CAPSULE STUDY N/A 05/04/2021   Procedure: GIVENS CAPSULE STUDY;  Surgeon: Vida Rigger, MD;  [facility];  Service: Endoscopy;  Laterality: N/A;   MULTIPLE TOOTH EXTRACTIONS      60's   PARATHYROIDECTOMY     POLYPECTOMY  12/08/2019   Procedure: POLYPECTOMY;  Surgeon: Corbin Ade, MD;  [facility];  Service: Endoscopy;;   POLYPECTOMY  02/01/2023   Procedure: POLYPECTOMY;  Surgeon: Lanelle Bal, DO;  [facility];  Service: Endoscopy;;   RADIOFREQUENCY ABLATION  05/18/2021   Procedure: RADIO FREQUENCY ABLATION;  Surgeon: Vida Rigger, MD;  [facility];  Service: Endoscopy;;   TEE WITHOUT CARDIOVERSION N/A 07/06/2021   Procedure: TRANSESOPHAGEAL ECHOCARDIOGRAM (TEE);  Surgeon: Little Ishikawa, MD;  Location: [facility] ENDOSCOPY;  Service: Cardiovascular;  Laterality: N/A;   TOTAL HIP ARTHROPLASTY Right 10/29/2013   Procedure: RIGHT TOTAL HIP ARTHROPLASTY ANTERIOR APPROACH;  Surgeon: Shelda Pal, MD;  Location: [facility];  Service: Orthopedics;  Laterality: Right;    Current Medications: No outpatient medications have been marked as taking for the 06/05/23 encounter (Appointment) with [facility].     Allergies:   Patient has no known allergies.   Social History   Socioeconomic History   Marital status: Widowed    Spouse name: Not on file   Number of children: 4   Years of education: 40   Highest education level: 12th grade  Occupational History   Occupation: Retired    Comment: [employer]  Tobacco Use   Smoking status: Never   Smokeless tobacco: Never  Vaping Use   Vaping status: Never Used  Substance and Sexual Activity   Alcohol use: No    Alcohol/week: 0.0 standard drinks of alcohol   Drug use: No   Sexual activity: Not Currently  Other Topics Concern   Not on file  Social History Narrative   Patient is widowed and lives in a one story home. She has four adult children and one son lives with her.       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   Social Determinants of Health   Financial Resource Strain: Low Risk  (12/27/2022)   Overall Financial Resource Strain (CARDIA)     Difficulty of Paying Living Expenses: Not hard at all  Food Insecurity: No Food Insecurity (12/27/2022)   Hunger Vital Sign    Worried About Running Out of Food in the Last Year: Never true    Ran Out of Food in the Last Year: Never true  Transportation Needs: No Transportation Needs (12/27/2022)   PRAPARE - Administrator, Civil Service (Medical): No    Lack of Transportation (Non-Medical): No  Physical Activity: Insufficiently Active (12/27/2022)   Exercise Vital Sign    Days of Exercise per Week: 3 days    Minutes of Exercise per Session: 30 min  Stress: No Stress Concern Present (12/27/2022)   [employer] of Occupational Health - Occupational Stress Questionnaire    Feeling of Stress : Not at all  Social Connections: Moderately Isolated (  12/27/2022)   Social Connection and Isolation Panel [NHANES]    Frequency of Communication with Friends and Family: More than three times a week    Frequency of Social Gatherings with Friends and Family: More than three times a week    Attends Religious Services: More than 4 times per year    Active Member of [employer] or Organizations: No    Attends Banker Meetings: Never    Marital Status: Widowed     Family History: The patient's ***family history includes Cancer (age of onset: 15) in her sister; Cancer (age of onset: 93) in her mother; Colitis (age of onset: 6) in her sister; Healthy in her daughter, son, son, and son; Heart attack (age of onset: 31) in her brother; Heart disease in her brother; Heart disease (age of onset: 75) in her father; Pulmonary embolism (age of onset: 66) in her brother.  ROS:   Please see the history of present illness.    All other systems reviewed and are negative.  EKGs/Labs/Other Studies Reviewed:    The following studies were reviewed today:   Cardiac Studies & Procedures       ECHOCARDIOGRAM  ECHOCARDIOGRAM COMPLETE 03/16/2023  Narrative ECHOCARDIOGRAM REPORT    Patient Name:    ALONNIE TRUEMAN Date of Exam: 03/16/2023 Medical Rec #:  [id number]           Height:       63.0 in Accession #:    [id number]          Weight:       201.0 lb Date of Birth:  08-28-1940           BSA:          1.938 m Patient Age:    82 years            BP:           143/74 mmHg Patient Gender: F                   HR:           41 bpm. Exam Location:  Outpatient  Procedure: 2D Echo, 3D Echo, Cardiac Doppler, Color Doppler and Strain Analysis  Indications:    I48.91* Unspeicified atrial fibrillation  History:        Patient has prior history of Echocardiogram examinations, most recent 03/29/2022. CHF, Abnormal ECG, Arrythmias:Bradycardia; Risk Factors:Hypertension and Dyslipidemia.  Sonographer:    Sheralyn Boatman RDCS Referring Phys: [id number] JAMES HOCHREIN  IMPRESSIONS   1. Left ventricular ejection fraction, by estimation, is 60 to 65%. The left ventricle has normal function. The left ventricle has no regional wall motion abnormalities. Left ventricular diastolic parameters are indeterminate. The average left ventricular global longitudinal strain is -18.6 %. The global longitudinal strain is normal. 2. Right ventricular systolic function is normal. The right ventricular size is moderately enlarged. 3. Left atrial size was severely dilated. 4. Right atrial size was severely dilated. 5. The mitral valve is degenerative. Mild to moderate mitral valve regurgitation. No evidence of mitral stenosis. There is mild prolapse of of the mitral valve. 6. The aortic valve is tricuspid. There is mild calcification of the aortic valve. Aortic valve regurgitation is not visualized. Aortic valve sclerosis is present, with no evidence of aortic valve stenosis. 7. The inferior vena cava is normal in size with greater than 50% respiratory variability, suggesting right atrial pressure of 3 mmHg.  Comparison(s): LV is more vigorous both visually and with  strain. MR has increased from prior.  FINDINGS Left  Ventricle: False tendon present in LV apex. Left ventricular ejection fraction, by estimation, is 60 to 65%. The left ventricle has normal function. The left ventricle has no regional wall motion abnormalities. The average left ventricular global longitudinal strain is -18.6 %. The global longitudinal strain is normal. The left ventricular internal cavity size was normal in size. There is no left ventricular hypertrophy. Left ventricular diastolic parameters are indeterminate.  Right Ventricle: The right ventricular size is moderately enlarged. No increase in right ventricular wall thickness. Right ventricular systolic function is normal.  Left Atrium: Left atrial size was severely dilated.  Right Atrium: Right atrial size was severely dilated.  Pericardium: There is no evidence of pericardial effusion.  Mitral Valve: The mitral valve is degenerative in appearance. There is mild prolapse of of the mitral valve. Mild to moderate mitral annular calcification. Mild to moderate mitral valve regurgitation. No evidence of mitral valve stenosis.  Tricuspid Valve: The tricuspid valve is normal in structure. Tricuspid valve regurgitation is trivial. No evidence of tricuspid stenosis.  Aortic Valve: The aortic valve is tricuspid. There is mild calcification of the aortic valve. Aortic valve regurgitation is not visualized. Aortic valve sclerosis is present, with no evidence of aortic valve stenosis.  Pulmonic Valve: The pulmonic valve was normal in structure. Pulmonic valve regurgitation is trivial. No evidence of pulmonic stenosis.  Aorta: The aortic root and ascending aorta are structurally normal, with no evidence of dilitation.  Venous: The inferior vena cava is normal in size with greater than 50% respiratory variability, suggesting right atrial pressure of 3 mmHg.  IAS/Shunts: The interatrial septum appears to be lipomatous. No atrial level shunt detected by color flow Doppler.   LEFT  VENTRICLE PLAX 2D LVIDd:         5.50 cm      Diastology LVIDs:         3.65 cm      LV e' medial:    5.44 cm/s LV PW:         1.10 cm      LV E/e' medial:  14.8 LV IVS:        1.00 cm      LV e' lateral:   6.20 cm/s LVOT diam:     2.40 cm      LV E/e' lateral: 13.0 LV SV:         120 LV SV Index:   62           2D Longitudinal Strain LVOT Area:     4.52 cm     2D Strain GLS Avg:     -18.6 %  LV Volumes (MOD) LV vol d, MOD A2C: 125.0 ml 3D Volume EF: LV vol d, MOD A4C: 120.0 ml 3D EF:        59 % LV vol s, MOD A2C: 47.5 ml  LV EDV:       164 ml LV vol s, MOD A4C: 48.6 ml  LV ESV:       66 ml LV SV MOD A2C:     77.5 ml  LV SV:        97 ml LV SV MOD A4C:     120.0 ml LV SV MOD BP:      76.4 ml  RIGHT VENTRICLE             IVC RV S prime:     11.10 cm/s  IVC diam: 1.90  cm TAPSE (M-mode): 2.3 cm  LEFT ATRIUM              Index        RIGHT ATRIUM           Index LA diam:        4.50 cm  2.32 cm/m   RA Area:     20.20 cm LA Vol (A2C):   112.0 ml 57.80 ml/m  RA Volume:   50.20 ml  25.91 ml/m LA Vol (A4C):   79.1 ml  40.82 ml/m LA Biplane Vol: 95.8 ml  49.44 ml/m AORTIC VALVE             PULMONIC VALVE LVOT Vmax:   107.00 cm/s PR End Diast Vel: 1.15 msec LVOT Vmean:  65.100 cm/s LVOT VTI:    0.266 m  AORTA Ao Root diam: 3.50 cm Ao Asc diam:  3.60 cm  MITRAL VALVE                  TRICUSPID VALVE MV Area (PHT): 3.12 cm       TR Peak grad:   26.0 mmHg MV Decel Time: 243 msec       TR Vmax:        255.00 cm/s MR Peak grad:    95.3 mmHg MR Mean grad:    58.0 mmHg    SHUNTS MR Vmax:         488.00 cm/s  Systemic VTI:  0.27 m MR Vmean:        353.0 cm/s   Systemic Diam: 2.40 cm MR PISA:         2.26 cm MR PISA Eff ROA: 18 mm MR PISA Radius:  0.60 cm MV E velocity: 80.60 cm/s MV A velocity: 60.80 cm/s MV E/A ratio:  1.33  Riley Lam MD Electronically signed by Riley Lam MD Signature Date/Time: 03/16/2023/5:43:06 PM    Final   TEE  ECHO  TEE 07/06/2021  Narrative TRANSESOPHOGEAL ECHO REPORT    Patient Name:   JAKIA NETTLETON Date of Exam: 07/06/2021 Medical Rec #:  [id number]           Height:       63.0 in Accession #:    [id number]          Weight:       196.0 lb Date of Birth:  [birth year]           BSA:          1.917 m Patient Age:    80 years            BP:           180/73 mmHg Patient Gender: F                   HR:           50 bpm. Exam Location:  Outpatient  Procedure: 3D Echo, Transesophageal Echo, Cardiac Doppler and Color Doppler  Indications:     Mitral Valve Disorder  History:         Patient has prior history of Echocardiogram examinations, most recent 04/01/2021. Risk Factors:Hypertension and Diabetes. Hypothyroidism.  Sonographer:     Leta Jungling RDCS Referring Phys:  [phone] MIHAI CROITORU Diagnosing Phys: Epifanio Lesches MD  PROCEDURE: After discussion of the risks and benefits of a TEE, an informed consent was obtained from the patient. TEE procedure time was 20 minutes. The transesophogeal probe was passed without difficulty through  the esophogus of the patient. Imaged were obtained with the patient in a left lateral decubitus position. Local oropharyngeal anesthetic was provided with Cetacaine. Sedation performed by different physician. Image quality was good. The patient's vital signs; including heart rate, blood pressure, and oxygen saturation; remained stable throughout the procedure. The patient developed no complications during the procedure.  IMPRESSIONS   1. Left ventricular ejection fraction, by estimation, is 50 to 55%. The left ventricle has low normal function. 2. Right ventricular systolic function is normal. The right ventricular size is moderately enlarged. 3. Left atrial size was severely dilated. No left atrial/left atrial appendage thrombus was detected. 4. Right atrial size was mildly dilated. 5. The aortic valve is tricuspid. Aortic valve regurgitation is not  visualized. 6. Tricuspid valve regurgitation is mild to moderate. 7. 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 The mitral valve is abnormal. Leaflet malcoaptation causing 2 central directed MR jets at A1/P1 and A2/P2, suspect atrial functional MR in setting of severe left atrial dilatation. Moderate mitral valve regurgitation. 2D ERO 0.19 cm^2, RV 46cc, 3D VCA 0.3 cm^2, RV 35cc/RF 36% by continuity equation, low E wave velocity, no pulmonary vein systolic reversal; consistent with moderate MR  FINDINGS Left Ventricle: Left ventricular ejection fraction, by estimation, is 50 to 55%. The left ventricle has low normal function. The left ventricular internal cavity size was normal in size. The ratio of pulmonic flow to systemic flow (Qp/Qs ratio) is 1.00.  Right Ventricle: The right ventricular size is moderately enlarged. No increase in right ventricular wall thickness. Right ventricular systolic function is normal.  Left Atrium: Left atrial size was severely dilated. No left atrial/left atrial appendage thrombus was detected.  Right Atrium: Right atrial size was mildly dilated.  Pericardium: There is no evidence of pericardial effusion.  Mitral Valve: The mitral valve is abnormal. Moderate mitral valve regurgitation. Mitral valve area by planimetry is 7.12 cm, and by the pressure half-time method is calculated to be 2.99 cm. The mean transmitral gradient is 1.0 mmHg. MV peak gradient, 4.0 mmHg. The mean mitral valve gradient is 1.0 mmHg.  Tricuspid Valve: The tricuspid valve is normal in structure. Tricuspid valve regurgitation is mild to moderate.  Aortic Valve: The aortic valve is tricuspid. Aortic valve regurgitation is not visualized.  Pulmonic Valve: The pulmonic valve was grossly normal. Pulmonic valve regurgitation is trivial.  Aorta: The aortic root is normal in size and structure.  IAS/Shunts: No atrial level shunt detected by color flow Doppler. The ratio  of pulmonic flow to systemic flow (Qp/Qs ratio) is 1.00.   LEFT VENTRICLE PLAX 2D LVOT diam:     2.20 cm LV SV:         63 LV SV Index:   33 LVOT Area:     3.80 cm   RIGHT VENTRICLE RVOT diam:      2.50 cm  AORTIC VALVE             PULMONIC VALVE LVOT Vmax:   83.60 cm/s  RVOT Peak grad: 4 mmHg LVOT Vmean:  51.800 cm/s LVOT VTI:    0.167 m  MITRAL VALVE                  TRICUSPID VALVE MV Area (PHT):  2.99 cm      TR Peak grad:   10.2 mmHg MV Area (plan): 7.12 cm      TR Vmax:        160.00 cm/s MV Area  VTI:    2.77 cm MV Peak grad:   4.0 mmHg      SHUNTS MV Mean grad:   1.0 mmHg      Systemic VTI:  0.17 m MV Vmax:        1.00 m/s      Systemic Diam: 2.20 cm MV Vmean:       45.3 cm/s     Pulmonic VTI:  0.130 m MV Decel Time:  254 msec      Pulmonic Diam: 2.50 cm MR Peak grad:    178.0 mmHg   Qp/Qs:         1.01 MR Mean grad:    105.0 mmHg MR Vmax:         667.00 cm/s MR Vmean:        484.0 cm/s MR PISA:         2.65 cm MR PISA Eff ROA: 16 mm MR PISA Radius:  0.65 cm MV E velocity: 69.50 cm/s MV A velocity: 65.50 cm/s MV E/A ratio:  1.06  Epifanio Lesches MD Electronically signed by Epifanio Lesches MD Signature Date/Time: 07/06/2021/9:35:15 PM    Final   MONITORS  LONG TERM MONITOR (3-14 DAYS) 02/21/2020  Narrative NSR Occasional supraventricular tachycardia with the longest run being 11 beats. Occasional isolated PVCs No sustained arrhythmias.            EKG:  EKG is *** ordered today.  The ekg ordered today demonstrates ***  Recent Labs: 04/20/2023: ALT 11; TSH 2.650 04/27/2023: BUN 26; Creatinine, Ser 1.19; Hemoglobin 12.2; Platelets 202; Potassium 4.1; Sodium 138  Recent Lipid Panel    Component Value Date/Time   CHOL 154 04/20/2023 1028   TRIG 182 (H) 04/20/2023 1028   HDL 44 04/20/2023 1028   CHOLHDL 3.5 04/20/2023 1028   CHOLHDL 3.7 02/07/2021 0528   VLDL 13 02/07/2021 0528   LDLCALC 79 04/20/2023 1028     Risk  Assessment/Calculations:   {Does this patient have ATRIAL FIBRILLATION?:[phone]}   CHA2DS2-VASc Score = 6 [CHF History: 1, HTN History: 1, Diabetes History: 1, Stroke History: 0, Vascular Disease History: 0, Age Score: 2, Gender Score: 1].  Therefore, the patient's annual risk of stroke is 9.7 %.        HAS-BLED score *** Hypertension (Uncontrolled in 30 days)  {YES/NO:21197} Abnormal renal and liver function (Dialysis, transplant, Cr >2.26 mg/dL /Cirrhosis or Bilirubin >2x Normal or AST/ALT/AP >3x Normal) {YES/NO:21197} Stroke {YES/NO:21197} Bleeding {YES/NO:21197} Labile INR (Unstable/high INR) {YES/NO:21197} Elderly (>65) {YES/NO:21197} Drugs or alcohol (>= 8 drinks/week, anti-plt or NSAID) {YES/NO:21197}   Physical Exam:    VS:  There were no vitals taken for this visit.    Wt Readings from Last 3 Encounters:  05/18/23 200 lb (90.7 kg)  04/20/23 201 lb (91.2 kg)  03/22/23 200 lb (90.7 kg)     GEN: *** Well nourished, well developed in no acute distress HEENT: Normal NECK: No JVD; No carotid bruits LYMPHATICS: No lymphadenopathy CARDIAC: ***RRR, no murmurs, rubs, gallops RESPIRATORY:  Clear to auscultation without rales, wheezing or rhonchi  ABDOMEN: Soft, non-tender, non-distended MUSCULOSKELETAL:  No edema; No deformity  SKIN: Warm and dry NEUROLOGIC:  Alert and oriented x 3 PSYCHIATRIC:  Normal affect   ASSESSMENT:    No diagnosis found. PLAN:    In order of problems listed above:       {Are you ordering a CV Procedure (e.g. stress test, cath, DCCV, TEE, etc)?   Press F2        :  [id number]}    Medication Adjustments/Labs and Tests Ordered: Current medicines are reviewed at length with the patient today.  Concerns regarding medicines are outlined above.  No orders of the defined types were placed in this encounter.  No orders of the defined types were placed in this encounter.   There are no Patient Instructions on file for this visit.    Signed, Georgie Chard, NP  06/02/2023 2:40 PM    [facility]

## 2023-06-05 ENCOUNTER — Ambulatory Visit: Payer: PPO | Attending: Cardiology | Admitting: Cardiology

## 2023-06-05 VITALS — BP 138/58 | HR 40 | Ht 63.0 in | Wt 202.8 lb

## 2023-06-05 DIAGNOSIS — Z8719 Personal history of other diseases of the digestive system: Secondary | ICD-10-CM

## 2023-06-05 DIAGNOSIS — Z01812 Encounter for preprocedural laboratory examination: Secondary | ICD-10-CM | POA: Diagnosis not present

## 2023-06-05 DIAGNOSIS — I10 Essential (primary) hypertension: Secondary | ICD-10-CM

## 2023-06-05 DIAGNOSIS — I482 Chronic atrial fibrillation, unspecified: Secondary | ICD-10-CM | POA: Diagnosis not present

## 2023-06-05 DIAGNOSIS — I5022 Chronic systolic (congestive) heart failure: Secondary | ICD-10-CM | POA: Diagnosis not present

## 2023-06-05 DIAGNOSIS — I480 Paroxysmal atrial fibrillation: Secondary | ICD-10-CM

## 2023-06-05 DIAGNOSIS — Z01810 Encounter for preprocedural cardiovascular examination: Secondary | ICD-10-CM

## 2023-06-05 NOTE — Patient Instructions (Signed)
Medication Instructions:  No changes *If you need a refill on your cardiac medications before your next appointment, please call your pharmacy*   Lab Work: Today: cbc, bmet   Testing/Procedures: none   Follow-Up: Per Structural Heart Team

## 2023-06-06 LAB — CBC
Hematocrit: 35.1 % (ref 34.0–46.6)
Hemoglobin: 11 g/dL — ABNORMAL LOW (ref 11.1–15.9)
MCH: 31.1 pg (ref 26.6–33.0)
MCHC: 31.3 g/dL — ABNORMAL LOW (ref 31.5–35.7)
MCV: 99 fL — ABNORMAL HIGH (ref 79–97)
Platelets: 216 x10E3/uL (ref 150–450)
RBC: 3.54 x10E6/uL — ABNORMAL LOW (ref 3.77–5.28)
RDW: 14.4 % (ref 11.7–15.4)
WBC: 4.8 x10E3/uL (ref 3.4–10.8)

## 2023-06-06 LAB — BASIC METABOLIC PANEL
BUN/Creatinine Ratio: 21 (ref 12–28)
BUN: 26 mg/dL (ref 8–27)
CO2: 23 mmol/L (ref 20–29)
Calcium: 11.1 mg/dL — ABNORMAL HIGH (ref 8.7–10.3)
Chloride: 110 mmol/L — ABNORMAL HIGH (ref 96–106)
Creatinine, Ser: 1.22 mg/dL — ABNORMAL HIGH (ref 0.57–1.00)
Glucose: 108 mg/dL — ABNORMAL HIGH (ref 70–99)
Potassium: 4.4 mmol/L (ref 3.5–5.2)
Sodium: 144 mmol/L (ref 134–144)
eGFR: 45 mL/min/{1.73_m2} — ABNORMAL LOW (ref >59)

## 2023-06-08 ENCOUNTER — Ambulatory Visit: Payer: PPO | Admitting: Oncology

## 2023-06-13 ENCOUNTER — Telehealth: Payer: Self-pay

## 2023-06-13 DIAGNOSIS — N1831 Chronic kidney disease, stage 3a: Secondary | ICD-10-CM

## 2023-06-13 NOTE — Progress Notes (Unsigned)
Pharmacy Medication Assistance Program Note    06/13/2023  Patient ID: [name] [name], female   DOB: 10-31-1940, 82 y.o.   MRN: [id number]     06/13/2023  Outreach Medication One  Initial Outreach Date (Medication One) 05/08/2023  Manufacturer Medication One [employer]  [employer] [facility] Farxiga  Dose of Farxiga 10MG   Type of Assistance Manufacturer Assistance  Date Application Sent to Patient 05/17/2023  Application Items Requested Application  Name of Prescriber Harlow Mares  Date Application Received From Patient 06/07/2023  Application Items Received From Patient Application     Faxing renewal application to [place].  Could a new rx be e-scribed to [facility]?

## 2023-06-14 ENCOUNTER — Telehealth: Payer: Self-pay

## 2023-06-14 MED ORDER — DAPAGLIFLOZIN PROPANEDIOL 10 MG PO TABS
10.0000 mg | ORAL_TABLET | Freq: Every day | ORAL | 5 refills | Status: DC
Start: 2023-06-14 — End: 2023-09-22

## 2023-06-14 NOTE — Telephone Encounter (Signed)
Rx sent as requested.

## 2023-06-14 NOTE — Telephone Encounter (Signed)
Confirmed procedure date of 06/15/2023. Confirmed NEW arrival time of 0900 for procedure time at 1130. Reviewed pre-procedure instructions with patient. Confirmed she stopped [place] as directed. The patient understands to call if questions/concerns arise prior to procedure.  She was grateful for call and agreed with plan.

## 2023-06-15 ENCOUNTER — Inpatient Hospital Stay (HOSPITAL_COMMUNITY)
Admission: RE | Admit: 2023-06-15 | Discharge: 2023-06-15 | DRG: 274 | Disposition: A | Discharge status: Home / Self Care | Payer: PPO | Attending: Cardiology | Admitting: Cardiology

## 2023-06-15 ENCOUNTER — Other Ambulatory Visit: Payer: Self-pay

## 2023-06-15 ENCOUNTER — Inpatient Hospital Stay (HOSPITAL_COMMUNITY): Payer: PPO

## 2023-06-15 ENCOUNTER — Encounter (HOSPITAL_COMMUNITY)
Admission: RE | Disposition: A | Discharge status: Home / Self Care | Payer: PPO | Source: Home / Self Care | Attending: Cardiology

## 2023-06-15 ENCOUNTER — Encounter: Payer: Self-pay | Admitting: Cardiology

## 2023-06-15 ENCOUNTER — Encounter (HOSPITAL_COMMUNITY): Payer: Self-pay | Admitting: Cardiology

## 2023-06-15 DIAGNOSIS — Z006 Encounter for examination for normal comparison and control in clinical research program: Secondary | ICD-10-CM | POA: Diagnosis not present

## 2023-06-15 DIAGNOSIS — Z95818 Presence of other cardiac implants and grafts: Principal | ICD-10-CM

## 2023-06-15 DIAGNOSIS — Z01818 Encounter for other preprocedural examination: Secondary | ICD-10-CM | POA: Diagnosis not present

## 2023-06-15 DIAGNOSIS — I5042 Chronic combined systolic (congestive) and diastolic (congestive) heart failure: Secondary | ICD-10-CM | POA: Diagnosis present

## 2023-06-15 DIAGNOSIS — D500 Iron deficiency anemia secondary to blood loss (chronic): Secondary | ICD-10-CM | POA: Diagnosis present

## 2023-06-15 DIAGNOSIS — E785 Hyperlipidemia, unspecified: Secondary | ICD-10-CM | POA: Diagnosis present

## 2023-06-15 DIAGNOSIS — I4891 Unspecified atrial fibrillation: Secondary | ICD-10-CM

## 2023-06-15 DIAGNOSIS — I480 Paroxysmal atrial fibrillation: Secondary | ICD-10-CM

## 2023-06-15 DIAGNOSIS — Z7901 Long term (current) use of anticoagulants: Secondary | ICD-10-CM

## 2023-06-15 DIAGNOSIS — E1122 Type 2 diabetes mellitus with diabetic chronic kidney disease: Secondary | ICD-10-CM | POA: Diagnosis not present

## 2023-06-15 DIAGNOSIS — I5022 Chronic systolic (congestive) heart failure: Secondary | ICD-10-CM | POA: Diagnosis not present

## 2023-06-15 DIAGNOSIS — I4819 Other persistent atrial fibrillation: Secondary | ICD-10-CM | POA: Diagnosis not present

## 2023-06-15 DIAGNOSIS — I517 Cardiomegaly: Secondary | ICD-10-CM | POA: Diagnosis not present

## 2023-06-15 DIAGNOSIS — Z7902 Long term (current) use of antithrombotics/antiplatelets: Secondary | ICD-10-CM

## 2023-06-15 DIAGNOSIS — N1831 Chronic kidney disease, stage 3a: Secondary | ICD-10-CM | POA: Diagnosis present

## 2023-06-15 DIAGNOSIS — I10 Essential (primary) hypertension: Secondary | ICD-10-CM | POA: Diagnosis present

## 2023-06-15 DIAGNOSIS — I110 Hypertensive heart disease with heart failure: Secondary | ICD-10-CM | POA: Diagnosis not present

## 2023-06-15 DIAGNOSIS — I340 Nonrheumatic mitral (valve) insufficiency: Secondary | ICD-10-CM | POA: Diagnosis not present

## 2023-06-15 DIAGNOSIS — R0989 Other specified symptoms and signs involving the circulatory and respiratory systems: Secondary | ICD-10-CM | POA: Diagnosis not present

## 2023-06-15 DIAGNOSIS — I152 Hypertension secondary to endocrine disorders: Secondary | ICD-10-CM | POA: Diagnosis present

## 2023-06-15 DIAGNOSIS — I130 Hypertensive heart and chronic kidney disease with heart failure and stage 1 through stage 4 chronic kidney disease, or unspecified chronic kidney disease: Secondary | ICD-10-CM | POA: Diagnosis not present

## 2023-06-15 DIAGNOSIS — Z8719 Personal history of other diseases of the digestive system: Secondary | ICD-10-CM

## 2023-06-15 DIAGNOSIS — I700 Atherosclerosis of aorta: Secondary | ICD-10-CM | POA: Diagnosis not present

## 2023-06-15 HISTORY — PX: TEE WITHOUT CARDIOVERSION: SHX5443

## 2023-06-15 HISTORY — PX: LEFT ATRIAL APPENDAGE OCCLUSION: EP1229

## 2023-06-15 HISTORY — DX: Presence of other cardiac implants and grafts: Z95.818

## 2023-06-15 LAB — SURGICAL PCR SCREEN
MRSA, PCR: NEGATIVE
Staphylococcus aureus: POSITIVE — AB

## 2023-06-15 LAB — ECHO TEE

## 2023-06-15 LAB — POCT ACTIVATED CLOTTING TIME: Activated Clotting Time: 239 s

## 2023-06-15 LAB — GLUCOSE, CAPILLARY: Glucose-Capillary: 109 mg/dL — ABNORMAL HIGH (ref 70–99)

## 2023-06-15 SURGERY — LEFT ATRIAL APPENDAGE OCCLUSION
Anesthesia: General

## 2023-06-15 MED ORDER — FENTANYL CITRATE (PF) 100 MCG/2ML IJ SOLN
INTRAMUSCULAR | Status: AC
  Filled 2023-06-15: qty 2

## 2023-06-15 MED ORDER — FENTANYL CITRATE (PF) 250 MCG/5ML IJ SOLN
INTRAMUSCULAR | Status: DC | PRN
  Administered 2023-06-15: 100 ug via INTRAVENOUS

## 2023-06-15 MED ORDER — DEXAMETHASONE SODIUM PHOSPHATE 10 MG/ML IJ SOLN
INTRAMUSCULAR | Status: DC | PRN
  Administered 2023-06-15: 10 mg via INTRAVENOUS

## 2023-06-15 MED ORDER — MUPIROCIN 2 % EX OINT: 1.0000 | TOPICAL_OINTMENT | Freq: Two times a day (BID) | CUTANEOUS | Status: DC

## 2023-06-15 MED ORDER — CHLORHEXIDINE GLUCONATE 4 % EX SOLN
Freq: Once | CUTANEOUS | Status: DC
Start: 2023-06-15 — End: 2023-06-15

## 2023-06-15 MED ORDER — ROCURONIUM BROMIDE 10 MG/ML (PF) SYRINGE
PREFILLED_SYRINGE | INTRAVENOUS | Status: DC | PRN
  Administered 2023-06-15: 100 mg via INTRAVENOUS

## 2023-06-15 MED ORDER — SODIUM CHLORIDE 0.9 % IV SOLN: INTRAVENOUS | Status: DC

## 2023-06-15 MED ORDER — SUGAMMADEX SODIUM 200 MG/2ML IV SOLN
INTRAVENOUS | Status: DC | PRN
  Administered 2023-06-15: 200 mg via INTRAVENOUS

## 2023-06-15 MED ORDER — LIDOCAINE 2% (20 MG/ML) 5 ML SYRINGE
INTRAMUSCULAR | Status: DC | PRN
  Administered 2023-06-15: 100 mg via INTRAVENOUS

## 2023-06-15 MED ORDER — CHLORHEXIDINE GLUCONATE 0.12 % MT SOLN
15.0000 mL | Freq: Once | OROMUCOSAL | Status: AC
  Administered 2023-06-15: 15 mL via OROMUCOSAL
  Filled 2023-06-15: qty 15

## 2023-06-15 MED ORDER — LACTATED RINGERS IV SOLN: INTRAVENOUS | Status: DC

## 2023-06-15 MED ORDER — ACETAMINOPHEN 325 MG PO TABS
ORAL_TABLET | ORAL | Status: AC
  Filled 2023-06-15: qty 2

## 2023-06-15 MED ORDER — HEPARIN SODIUM (PORCINE) 1000 UNIT/ML IJ SOLN
INTRAMUSCULAR | Status: DC | PRN
  Administered 2023-06-15: 7000 [IU] via INTRAVENOUS
  Administered 2023-06-15: 14000 [IU] via INTRAVENOUS

## 2023-06-15 MED ORDER — LACTATED RINGERS IV SOLN: INTRAVENOUS | Status: DC | PRN

## 2023-06-15 MED ORDER — ONDANSETRON HCL 4 MG/2ML IJ SOLN: 4.0000 mg | Freq: Four times a day (QID) | INTRAMUSCULAR | Status: DC | PRN

## 2023-06-15 MED ORDER — ACETAMINOPHEN 325 MG PO TABS
650.0000 mg | ORAL_TABLET | ORAL | Status: DC | PRN
  Administered 2023-06-15: 650 mg via ORAL

## 2023-06-15 MED ORDER — HEPARIN (PORCINE) IN NACL 2000-0.9 UNIT/L-% IV SOLN
INTRAVENOUS | Status: DC | PRN
  Administered 2023-06-15: 1000 mL

## 2023-06-15 MED ORDER — PROPOFOL 10 MG/ML IV BOLUS
INTRAVENOUS | Status: DC | PRN
  Administered 2023-06-15: 100 mg via INTRAVENOUS

## 2023-06-15 MED ORDER — CHLORHEXIDINE GLUCONATE 0.12 % MT SOLN
OROMUCOSAL | Status: AC
  Filled 2023-06-15: qty 15

## 2023-06-15 MED ORDER — GLYCOPYRROLATE 0.2 MG/ML IJ SOLN
INTRAMUSCULAR | Status: DC | PRN
  Administered 2023-06-15: .2 mg via INTRAVENOUS

## 2023-06-15 MED ORDER — PROTAMINE SULFATE 10 MG/ML IV SOLN
INTRAVENOUS | Status: DC | PRN
  Administered 2023-06-15: 30 mg via INTRAVENOUS

## 2023-06-15 MED ORDER — IOHEXOL 350 MG/ML SOLN
INTRAVENOUS | Status: DC | PRN
  Administered 2023-06-15: 10 mL
  Administered 2023-06-15: 5 mL

## 2023-06-15 MED ORDER — CEFAZOLIN SODIUM-DEXTROSE 2-4 GM/100ML-% IV SOLN
2.0000 g | INTRAVENOUS | Status: AC
Start: 2023-06-15 — End: 2023-06-15
  Administered 2023-06-15: 2 g via INTRAVENOUS
  Filled 2023-06-15: qty 100

## 2023-06-15 MED ORDER — ONDANSETRON HCL 4 MG/2ML IJ SOLN
INTRAMUSCULAR | Status: DC | PRN
  Administered 2023-06-15: 4 mg via INTRAVENOUS

## 2023-06-15 MED ORDER — CHLORHEXIDINE GLUCONATE CLOTH 2 % EX PADS: 6.0000 | MEDICATED_PAD | Freq: Every day | CUTANEOUS | Status: DC

## 2023-06-15 SURGICAL SUPPLY — 15 items
BLANKET WARM UNDERBOD FULL ACC (MISCELLANEOUS) ×1
CATH INFINITI 5 FR STR PIGTAIL (CATHETERS) ×1
CLOSURE PERCLOSE PROSTYLE (VASCULAR PRODUCTS) ×2
DILATOR VESSEL 38 20CM 12FR (INTRODUCER) ×1
KIT SHEA VERSACROSS LAAC CONNE (KITS) ×1
PACK CARDIAC CATHETERIZATION (CUSTOM PROCEDURE TRAY) ×1
PAD DEFIB RADIO PHYSIO CONN (PAD) ×1
SHEATH PERFORMER 16FR 30 (SHEATH) ×1
SHEATH PINNACLE 8F 10CM (SHEATH) ×1
SHEATH PROBE COVER 6X72 (BAG) ×1
SYS WATCHMAN FXD DBL (SHEATH) ×1
TRANSDUCER W/STOPCOCK (MISCELLANEOUS) ×1
WATCHMAN FLX PRO 24 (Prosthesis & Implant Heart) ×1 IMPLANT
WATCHMAN FLX PRO PROCEDURE (KITS) ×1
WATCHMAN FXD CRV SYS PROCEDURE (KITS) ×1

## 2023-06-15 NOTE — Transfer of Care (Signed)
Immediate Anesthesia Transfer of Care Note  Patient: [name] [name]  Procedure(s) Performed: LEFT ATRIAL APPENDAGE OCCLUSION TRANSESOPHAGEAL ECHOCARDIOGRAM  Patient Location: Cath Lab  Anesthesia Type:General  Level of Consciousness: awake, alert , and oriented  Airway & Oxygen Therapy: Patient Spontanous Breathing and Patient connected to nasal cannula oxygen  Post-op Assessment: Report given to RN and Post -op Vital signs reviewed and stable  Post vital signs: Reviewed and stable  Last Vitals:  Vitals Value Taken Time  BP 145/73 06/15/23 1215  Temp 97.4   Pulse 76 06/15/23 1216  Resp 19 06/15/23 1216  SpO2 96 % 06/15/23 1216  Vitals shown include unfiled device data.  Last Pain:  Vitals:   06/15/23 0907  TempSrc: Oral         Complications: There were no known notable events for this encounter.

## 2023-06-15 NOTE — H&P (Signed)
Electrophysiology Office Note:     Date:  06/15/2023    ID:  Madisyn, Ryder October 01, 1940, MRN [id number]   [facility] Cardiologist:  Rollene Rotunda, MD  [facility] Electrophysiologist:  None    Referring MD: Gabriel Earing, FNP    Chief Complaint: Atrial fibrillation   History of Present Illness:     [name] [name] is a 82 y.o. femalewho I am seeing today for an evaluation of atrial fibrillation at the request of Dr. Antoine Poche.   The patient was last seen by Dr. Antoine Poche on March 08, 2023.   The patient has a medical history that includes chronic systolic heart failure, at least moderate mitral regurgitation, GI bleeding.  At the last appointment with Dr. Antoine Poche, left atrial appendage occlusion was discussed with the patient as a mechanism to avoid long-term exposure to anticoagulation given her history of GI bleeding.      Presents for LAAO today. Procedure reviewed.         Objective Their past medical, social and family history was reveiwed.     ROS:   Please see the history of present illness.    All other systems reviewed and are negative.   EKGs/Labs/Other Studies Reviewed:     The following studies were reviewed today:   March 16, 2023 echo EF 60-65 RV normal Severely dilated left and right atrium Mild to moderate MR   March 08, 2023 EKG shows sinus rhythm   April 03, 2019 chest CT reviewed Difficult to assess left atrial appendage dimensions given nongated CT.        Physical Exam:     VS:  BP 138/61   Pulse 54   Ht 5\' 3"  (1.6 m)   Wt 200 lb (90.7 kg)   SpO2 96%   BMI 35.43 kg/m         Wt Readings from Last 3 Encounters:  03/22/23 200 lb (90.7 kg)  03/08/23 201 lb (91.2 kg)  02/23/23 200 lb 6.4 oz (90.9 kg)      GEN:  Well nourished, well developed in no acute distress CARDIAC: RRR, no murmurs, rubs, gallops RESPIRATORY:  Clear to auscultation without rales, wheezing or rhonchi           Assessment ASSESSMENT AND PLAN:     1. Chronic systolic congestive heart failure (HCC)   2. Paroxysmal atrial fibrillation (HCC)   3. History of GI bleed       #Paroxysmal atrial fibrillation #GI bleeding The patient largely maintained sinus rhythm.  She is referred to discuss left atrial appendage occlusion as a mechanism to reduce her stroke risk given her history of GI bleeding.  I discussed the procedure in detail including the risks and likelihood of success.  She would like to proceed with the evaluation.  I think she is an acceptable candidate.   --------------   I have seen [name] [name] in the office today who is being considered for a Watchman left atrial appendage closure device. I believe they will benefit from this procedure given their history of atrial fibrillation, CHA2DS2-VASc score of 6. Unfortunately, the patient is not felt to be a long term anticoagulation candidate secondary to GI bleeding. The patient's chart has been reviewed and I feel that they would be a candidate for short term oral anticoagulation after Watchman implant.    It is my belief that after undergoing a LAA closure procedure, [name] [name] will not need long term anticoagulation which eliminates anticoagulation  side effects and major bleeding risk.    Procedural risks for the Watchman implant have been reviewed with the patient including a 0.5% risk of stroke, <1% risk of perforation and <1% risk of device embolization. Other risks include bleeding, vascular damage, tamponade, worsening renal function, and death. The patient understands these risk and wishes to proceed.       The published clinical data on the safety and effectiveness of WATCHMAN include but are not limited to the following: - Holmes DR, Everlene Farrier, Sick P et al. for the PROTECT AF Investigators. Percutaneous closure of the left atrial appendage versus warfarin therapy for prevention of stroke in patients with atrial  fibrillation: a randomised non-inferiority trial. Lancet 2009; 374: 534-42. Everlene Farrier, Doshi SK, Isa Rankin D et al. on behalf of the PROTECT AF Investigators. Percutaneous Left Atrial Appendage Closure for Stroke Prophylaxis in Patients With Atrial Fibrillation 2.3-Year Follow-up of the PROTECT AF (Watchman Left Atrial Appendage System for Embolic Protection in Patients With Atrial Fibrillation) Trial. Circulation 2013; 127:720-729. - Alli O, Doshi S,  Kar S, Reddy VY, Sievert H et al. Quality of Life Assessment in the Randomized PROTECT AF (Percutaneous Closure of the Left Atrial Appendage Versus Warfarin Therapy for Prevention of Stroke in Patients With Atrial Fibrillation) Trial of Patients at Risk for Stroke With Nonvalvular Atrial Fibrillation. J Am Coll Cardiol 2013; 61:1790-8. Aline August DR, Mia [place], Price M, Whisenant B, Sievert H, Doshi S, Huber K, Reddy V. Prospective randomized evaluation of the Watchman left atrial appendage Device in patients with atrial fibrillation versus long-term warfarin therapy; the PREVAIL trial. Journal of the [employer] of Cardiology, Vol. 4, No. 1, 2014, 1-11. - Kar S, Doshi SK, Sadhu A, Horton R, Osorio J et al. Primary outcome evaluation of a next-generation left atrial appendage closure device: results from the PINNACLE FLX trial. Circulation 2021;143(18)1754-1762.      After today's visit with the patient which was dedicated solely for shared decision making visit regarding LAA closure device, the patient decided to proceed with the LAA appendage closure procedure scheduled to be done in the near future at [facility].   We would not pursue preprocedural imaging with CT scan given her abnormal kidney function.   HAS-BLED score 3 Hypertension Yes  Abnormal renal and liver function (Dialysis, transplant, Cr >2.26 mg/dL /Cirrhosis or Bilirubin >2x Normal or AST/ALT/AP >3x Normal) No  Stroke No  Bleeding Yes  Labile INR (Unstable/high INR) No   Elderly (>65) Yes  Drugs or alcohol (>= 8 drinks/week, anti-plt or NSAID) No    CHA2DS2-VASc Score = 6  The patient's score is based upon: CHF History: 1 HTN History: 1 Diabetes History: 1 Stroke History: 0 Vascular Disease History: 0 Age Score: 2 Gender Score: 1     Will plan to keep her on Eliquis 2.5 mg by mouth twice daily in the periprocedural period.  Plan to continue 2.5 mg by mouth twice daily with a goal of 6 months post implant therapy.  I discussed how this is a variation from the recommendations from the clinical trials although there are data supporting this regimen in patients with high risk bleeding.     Presents for LAAO today. Procedure reviewed.     Signed, Rossie Muskrat. [name] Brothers, MD, [facility], [facility] 06/15/2023 Electrophysiology [facility]

## 2023-06-15 NOTE — Progress Notes (Signed)
Called 2C to give report. RN and Consulting civil engineer unable to take report at this time. They state that they will call me back when able to take report.

## 2023-06-15 NOTE — Discharge Summary (Signed)
[facility]    Patient ID: [name] [name],  MRN: [id number], DOB/AGE: 08-31-1940 82 y.o.  Admit date: 06/15/2023 Discharge date: 06/15/2023  Primary Care Physician: [name] Earing, FNP  Primary Cardiologist: [name] Rotunda, MD  Electrophysiologist: None  Primary Discharge Diagnosis:  Persistent Atrial Fibrillation Poor candidacy for long term anticoagulation due to h/o GI bleeding  Procedures This Admission:  Transeptal Puncture Intra-procedural TEE which showed no LAA thrombus Left atrial appendage occlusive device placement on 06/15/2023 by Dr. Lalla [name].   This study demonstrated:  CONCLUSIONS:  1.Successful implantation of a WATCHMAN left atrial appendage occlusive device    2. TEE demonstrating no LAA thrombus 3. No early apparent complications.    Post Implant Anticoagulation Strategy: Continue Eliquis 2.5mg  by mouth twice daily for 45 days after implant. After 45 days, stop Eliquis and start Plavix 75mg  by mouth once daily to complete 6 months of post implant therapy. Plan for TEE 60 days after implant to assess appendage patency and Watchman position.  Brief HPI: [name] [name] is a 82 y.o. female with a history of chronic systolic CHF with LV normalization, mild/mod MR with mod MAC by recent echo, HTN, iron deficiency anemia, CKD stage IIIa, and persistent atrial fibrillation who presented today for LAAO with Dr. Lalla [name].   [name] [name] is followed by Dr. Antoine [name] for her cardiology care. She was last seen 03/08/23. At that time, she was not felt to be a good long term anticoagulation candidate and was referred to Dr. Lalla [name] for consideration of LAAO with Watchman. CT deferred given hx of CKD stage IIIa and procedure scheduled for 06/15/2023.  Marland Kitchen  Hospital Course:  The patient was admitted and underwent left atrial appendage occlusive device placement with 24mm Watchman FLX Pro device.  She was monitored in the post procedure setting and has  done very well with no concerns. Given this, she is being considered for same day discharge later today. Groin site has been stable without evidence of hematoma or bleeding. Wound care and restrictions were reviewed with the patient. The patient has been scheduled for post procedure follow up with [name] Chard, NP in approximately 1 month. She will restart reduced dose Eliquis this evening and continue for 45 days then stop. At that time she will transition to Plavix 75mg  daily to complete 6 months of therapy. She will require dental SBE during this time and should refrain from dental work or cleanings for at least 30 days post implant. SBE to be RXd at follow up. Given poor renal function plan for post implant TEE at 60 days.   Physical Exam: Vitals:   06/15/23 1330 06/15/23 1345 06/15/23 1400 06/15/23 1441  BP: (!) 152/65 (!) 159/77 (!) 161/52 (!) 143/93  Pulse: (!) 39 (!) 49 (!) 41 (!) 46  Resp: 15 14 18 16   Temp:    97.8 F (36.6 C)  TempSrc:    Oral  SpO2: 95% 99% 96% 90%  Weight:      Height:       Labs:   Lab Results  Component Value Date   WBC 4.8 06/05/2023   HGB 11.0 (L) 06/05/2023   HCT 35.1 06/05/2023   MCV 99 (H) 06/05/2023   PLT 216 06/05/2023   No results for input(s): "NA", "K", "CL", "CO2", "BUN", "CREATININE", "CALCIUM", "PROT", "BILITOT", "ALKPHOS", "ALT", "AST", "GLUCOSE" in the last 168 hours.  Invalid input(s): "LABALBU"   Discharge Medications:  Allergies as of 06/15/2023   No Known  Allergies      Medication List     TAKE these medications    acetaminophen 325 MG tablet Commonly known as: TYLENOL Take 2 tablets (650 mg total) by mouth every 6 (six) hours as needed for mild pain or headache (or Fever >/= 101).   allopurinol 100 MG tablet Commonly known as: ZYLOPRIM Take 1 tablet (100 mg total) by mouth daily.   amLODipine 5 MG tablet Commonly known as: NORVASC Take 1 tablet (5 mg total) by mouth daily.   apixaban 2.5 MG Tabs tablet Commonly  known as: ELIQUIS Take 1 tablet (2.5 mg total) by mouth 2 (two) times daily. Notes to patient: Restart Eliquis THIS EVENING 11/21   atorvastatin 20 MG tablet Commonly known as: LIPITOR Take 1 tablet by mouth once daily   B-12 PO Take 1 capsule by mouth daily.   dapagliflozin propanediol 10 MG Tabs tablet Commonly known as: Farxiga Take 1 tablet (10 mg total) by mouth daily.   ferrous sulfate 325 (65 FE) MG EC tablet Take 325 mg by mouth daily.   furosemide 20 MG tablet Commonly known as: LASIX Take 1 tablet by mouth once daily   GNP CRANBERRY EXTRACT PO Take 1 tablet by mouth in the morning.   levothyroxine 50 MCG tablet Commonly known as: SYNTHROID TAKE 1 TABLET BY MOUTH ONCE DAILY BEFORE BREAKFAST   loratadine 10 MG tablet Commonly known as: CLARITIN Take 10 mg by mouth daily.   losartan 25 MG tablet Commonly known as: COZAAR Take 1 tablet (25 mg total) by mouth daily.   oxybutynin 10 MG 24 hr tablet Commonly known as: DITROPAN-XL Take 1 tablet (10 mg total) by mouth at bedtime.   pantoprazole 40 MG tablet Commonly known as: PROTONIX Take 1 tablet (40 mg total) by mouth 2 (two) times daily before a meal.        Disposition:  Home  Discharge Instructions     Call MD for:  redness, tenderness, or signs of infection (pain, swelling, redness, odor or green/yellow discharge around incision site)   Complete by: As directed    Call MD for:  severe uncontrolled pain   Complete by: As directed    Call MD for:  temperature >100.4   Complete by: As directed    Diet - low sodium heart healthy   Complete by: As directed    Discharge instructions   Complete by: As directed    [facility] Procedure, Care After  Procedure MD: Dr. Isidoro [name] Clinical Coordinator: [name] Fells, RN  This sheet gives you information about how to care for yourself after your procedure. Your health care provider may also give you more specific instructions. If you have problems or  questions, contact your health care provider.  What can I expect after the procedure? After the procedure, it is common to have: Bruising around your puncture site. Tenderness around your puncture site. Tiredness (fatigue).  Medication instructions It is very important to continue to take your blood thinner as directed by your doctor after the Watchman procedure. Call your procedure doctor's office with question or concerns. If you are on Coumadin (warfarin), you will have your INR checked the week after your procedure, with a goal INR of 2.0 - 3.0. Please follow your medication instructions on your discharge summary. Only take the medications listed on your discharge paperwork.  Follow up You will be seen in 1 month after your procedure You will have a repeat CT scan approximately 8 weeks after your procedure  mark to check your device You will follow up the MD/APP who performed your procedure 6 months after your procedure The Watchman Clinical Coordinator will check in with you from time to time, including 1 and 2 years after your procedure.    Follow these instructions at home: Puncture site care  Follow instructions from your health care provider about how to take care of your puncture site. Make sure you: If present, leave stitches (sutures), skin glue, or adhesive strips in place.  If a large square bandage is present, this may be removed 24 hours after surgery.  Check your puncture site every day for signs of infection. Check for: Redness, swelling, or pain. Fluid or blood. If your puncture site starts to bleed, lie down on your back, apply firm pressure to the area, and contact your health care provider. Warmth. Pus or a bad smell. Driving Do not drive yourself home if you received sedation Do not drive for at least 4 days after your procedure or however long your health care provider recommends. (Do not resume driving if you have previously been instructed not to drive for  other health reasons.) Do not spend greater than 1 hour at a time in a car for the first 3 days. Stop and take a break with a 5 minute walk at least every hour.  Do not drive or use heavy machinery while taking prescription pain medicine.  Activity Avoid activities that take a lot of effort, including exercise, for at least 7 days after your procedure. For the first 3 days, avoid sitting for longer than one hour at a time.  Avoid alcoholic beverages, signing paperwork, or participating in legal proceedings for 24 hours after receiving sedation Do not lift anything that is heavier than 10 lb (4.5 kg) for one week.  No sexual activity for 1 week.  Return to your normal activities as told by your health care provider. Ask your health care provider what activities are safe for you. General instructions Take over-the-counter and prescription medicines only as told by your health care provider. Do not use any products that contain nicotine or tobacco, such as cigarettes and e-cigarettes. If you need help quitting, ask your health care provider. You may shower after 24 hours, but Do not take baths, swim, or use a hot tub for 1 week.  Do not drink alcohol for 24 hours after your procedure. Keep all follow-up visits as told by your health care provider. This is important. Dental Work: You will require antibiotics prior to any dental work, including cleanings, for 6 months after your Watchman implantation to help protect you from infection. After 6 months, antibiotics are no longer required. Contact a health care provider if: You have redness, mild swelling, or pain around your puncture site. You have soreness in your throat or at your puncture site that does not improve after several days You have fluid or blood coming from your puncture site that stops after applying firm pressure to the area. Your puncture site feels warm to the touch. You have pus or a bad smell coming from your puncture site. You  have a fever. You have chest pain or discomfort that spreads to your neck, jaw, or arm. You are sweating a lot. You feel nauseous. You have a fast or irregular heartbeat. You have shortness of breath. You are dizzy or light-headed and feel the need to lie down. You have pain or numbness in the arm or leg closest to your puncture site. Get  help right away if: Your puncture site suddenly swells. Your puncture site is bleeding and the bleeding does not stop after applying firm pressure to the area. These symptoms may represent a serious problem that is an emergency. Do not wait to see if the symptoms will go away. Get medical help right away. Call your local emergency services (911 in the [place] Do not drive yourself to the hospital. Summary After the procedure, it is normal to have bruising and tenderness at the puncture site in your groin, neck, or forearm. Check your puncture site every day for signs of infection. Get help right away if your puncture site is bleeding and the bleeding does not stop after applying firm pressure to the area. This is a medical emergency.  This information is not intended to replace advice given to you by your health care provider. Make sure you discuss any questions you have with your health care provider.   Increase activity slowly   Complete by: As directed        Follow-up Information     Filbert Schilder, NP Follow up on 07/28/2023.   Specialty: Cardiology Why: at 10:15AM. Please arrive by [employer] information: [address] [place] [address] [phone]                 Duration of Discharge Encounter: Greater than 30 minutes including physician time.  Signed, [name] Chard, NP  06/15/2023 3:34 PM

## 2023-06-15 NOTE — Anesthesia Preprocedure Evaluation (Signed)
Anesthesia Evaluation  Patient identified by MRN, date of birth, ID band Patient awake    Reviewed: Allergy & Precautions, H&P , NPO status , Patient's Chart, lab work & pertinent test results  History of Anesthesia Complications History of anesthetic complications: severe burning sensation in forearm with propofol.  Airway Mallampati: II  TM Distance: >3 FB Neck ROM: Full    Dental  (+) Edentulous Upper, Edentulous Lower   Pulmonary shortness of breath and with exertion, PE   Pulmonary exam normal breath sounds clear to auscultation       Cardiovascular Exercise Tolerance: Good hypertension, Pt. on medications +CHF and + DOE  + dysrhythmias Atrial Fibrillation + Valvular Problems/Murmurs (Nonrheumatic mitral valve regurgitation Nonrheumatic tricuspid valve regurgitation)  Rhythm:Irregular Rate:Normal  1. Left ventricular ejection fraction, by estimation, is 50 to 55%. Left  ventricular ejection fraction by 3D volume is 53 %. The left ventricle has  low normal function. Left ventricular endocardial border not optimally  defined to evaluate regional wall  motion. There is moderate left ventricular hypertrophy. Left ventricular  diastolic parameters are consistent with Grade I diastolic dysfunction  (impaired relaxation).   2. Right ventricular systolic function is normal. The right ventricular  size is mildly enlarged. There is normal pulmonary artery systolic  pressure. The estimated right ventricular systolic pressure is 30.7 mmHg.   3. Left atrial size was severely dilated.   4. Right atrial size was mildly dilated.   5. The mitral valve is degenerative. Mild mitral valve regurgitation. No  evidence of mitral stenosis. Moderate mitral annular calcification.   6. The aortic valve is grossly normal. There is mild calcification of the  aortic valve. Aortic valve regurgitation is not visualized. No aortic  stenosis is present.    7. The inferior vena cava is normal in size with greater than 50%  respiratory variability, suggesting right atrial pressure of 3 mmHg.     Neuro/Psych  Headaches  Neuromuscular disease  negative psych ROS   GI/Hepatic Neg liver ROS,GERD  Medicated and Controlled,,  Endo/Other  diabetes, Well Controlled, Type 2, Oral Hypoglycemic AgentsHypothyroidism    Renal/GU CRFRenal disease  negative genitourinary   Musculoskeletal  (+) Arthritis , Osteoarthritis,    Abdominal   Peds negative pediatric ROS (+)  Hematology  (+) Blood dyscrasia, anemia   Anesthesia Other Findings   Reproductive/Obstetrics negative OB ROS                              Anesthesia Physical Anesthesia Plan  ASA: 3  Anesthesia Plan: General   Post-op Pain Management: Minimal or no pain anticipated   Induction: Intravenous  PONV Risk Score and Plan: 3 and Ondansetron, Dexamethasone and Treatment may vary due to age or medical condition  Airway Management Planned: Oral ETT  Additional Equipment: ClearSight  Intra-op Plan:   Post-operative Plan: Extubation in OR  Informed Consent: I have reviewed the patients History and Physical, chart, labs and discussed the procedure including the risks, benefits and alternatives for the proposed anesthesia with the patient or authorized representative who has indicated his/her understanding and acceptance.       Plan Discussed with: CRNA and Surgeon  Anesthesia Plan Comments:          Anesthesia Quick Evaluation

## 2023-06-15 NOTE — Anesthesia Postprocedure Evaluation (Signed)
Anesthesia Post Note  Patient: [name] [name]  Procedure(s) Performed: LEFT ATRIAL APPENDAGE OCCLUSION TRANSESOPHAGEAL ECHOCARDIOGRAM     Patient location during evaluation: PACU Anesthesia Type: General Level of consciousness: awake and alert Pain management: pain level controlled Vital Signs Assessment: post-procedure vital signs reviewed and stable Respiratory status: spontaneous breathing, nonlabored ventilation, respiratory function stable and patient connected to nasal cannula oxygen Cardiovascular status: blood pressure returned to baseline and stable Postop Assessment: no apparent nausea or vomiting Anesthetic complications: no   There were no known notable events for this encounter.  Last Vitals:  Vitals:   06/15/23 1245 06/15/23 1250  BP: (!) 174/66 (!) 174/64  Pulse: (!) 47 (!) 46  Resp: 19 14  Temp:    SpO2: 98% 94%    Last Pain:  Vitals:   06/15/23 1216  TempSrc: Tympanic  PainSc: 0-No pain                 Idalou Nation

## 2023-06-15 NOTE — Anesthesia Procedure Notes (Addendum)
Procedure Name: Intubation Date/Time: 06/15/2023 10:49 AM  Performed by: Pincus Large, CRNAPre-anesthesia Checklist: Patient identified, Emergency Drugs available, Suction available and Patient being monitored Patient Re-evaluated:Patient Re-evaluated prior to induction Oxygen Delivery Method: Circle System Utilized Preoxygenation: Pre-oxygenation with 100% oxygen Induction Type: IV induction Ventilation: Mask ventilation without difficulty Laryngoscope Size: Miller and 2 Grade View: Grade II Tube type: Oral Tube size: 7.0 mm Number of attempts: 1 Airway Equipment and Method: Stylet and Oral airway Placement Confirmation: ETT inserted through vocal cords under direct vision, positive ETCO2 and breath sounds checked- equal and bilateral Secured at: 21 cm Tube secured with: Tape Dental Injury: Teeth and Oropharynx as per pre-operative assessment

## 2023-06-15 NOTE — Progress Notes (Addendum)
Georgie Chard, NP at bedside. Made her aware that patient has low heart rate at time as low as 37 and is in and out of AFIB with some PVC's. Patient is stable and state "my heart rate is usually low." Noreene Larsson acknowledged. No new orders.

## 2023-06-16 ENCOUNTER — Telehealth: Payer: Self-pay | Admitting: Cardiology

## 2023-06-16 ENCOUNTER — Encounter (HOSPITAL_COMMUNITY): Payer: Self-pay | Admitting: Cardiology

## 2023-06-16 ENCOUNTER — Other Ambulatory Visit: Payer: Self-pay

## 2023-06-16 DIAGNOSIS — Z95818 Presence of other cardiac implants and grafts: Secondary | ICD-10-CM

## 2023-06-16 DIAGNOSIS — I480 Paroxysmal atrial fibrillation: Secondary | ICD-10-CM

## 2023-06-16 MED FILL — Fentanyl Citrate Preservative Free (PF) Inj 100 MCG/2ML: INTRAMUSCULAR | Qty: 2 | Status: AC

## 2023-06-16 NOTE — Telephone Encounter (Signed)
  [facility]   Watchman Team  Contacted the patient regarding discharge from [facility] on 06/15/23. Patient reports she is doing very well with no complaints today. Restarted Eliquis and reports groin site has no bleeding.   The patient understands to follow up with Georgie Chard NP on 07/28/23  The patient understands discharge instructions? Yes  The patient understands medications and regimen? Yes   The patient understands to call with any questions or concerns prior to scheduled visit.

## 2023-06-17 LAB — TYPE AND SCREEN
ABO/RH(D): O POS
Antibody Screen: NEGATIVE
Donor AG Type: NEGATIVE
Donor AG Type: NEGATIVE
Unit division: 0
Unit division: 0

## 2023-06-17 LAB — BPAM RBC
Blood Product Expiration Date: 202412232359
Blood Product Expiration Date: 202412272359
Unit Type and Rh: 5100
Unit Type and Rh: 5100

## 2023-06-19 ENCOUNTER — Other Ambulatory Visit: Payer: Self-pay

## 2023-06-19 DIAGNOSIS — D500 Iron deficiency anemia secondary to blood loss (chronic): Secondary | ICD-10-CM

## 2023-06-20 ENCOUNTER — Inpatient Hospital Stay: Payer: PPO | Attending: Physician Assistant

## 2023-06-20 DIAGNOSIS — I4891 Unspecified atrial fibrillation: Secondary | ICD-10-CM | POA: Diagnosis not present

## 2023-06-20 DIAGNOSIS — E559 Vitamin D deficiency, unspecified: Secondary | ICD-10-CM | POA: Diagnosis not present

## 2023-06-20 DIAGNOSIS — Z86718 Personal history of other venous thrombosis and embolism: Secondary | ICD-10-CM | POA: Diagnosis not present

## 2023-06-20 DIAGNOSIS — Z7901 Long term (current) use of anticoagulants: Secondary | ICD-10-CM | POA: Diagnosis not present

## 2023-06-20 DIAGNOSIS — D500 Iron deficiency anemia secondary to blood loss (chronic): Secondary | ICD-10-CM | POA: Diagnosis not present

## 2023-06-20 DIAGNOSIS — Z86711 Personal history of pulmonary embolism: Secondary | ICD-10-CM | POA: Diagnosis not present

## 2023-06-20 DIAGNOSIS — Z79899 Other long term (current) drug therapy: Secondary | ICD-10-CM | POA: Insufficient documentation

## 2023-06-20 LAB — CBC WITH DIFFERENTIAL/PLATELET
Abs Immature Granulocytes: 0.02 10*3/uL (ref 0.00–0.07)
Basophils Absolute: 0 10*3/uL (ref 0.0–0.1)
Basophils Relative: 0 %
Eosinophils Absolute: 0.1 10*3/uL (ref 0.0–0.5)
Eosinophils Relative: 3 %
HCT: 32.9 % — ABNORMAL LOW (ref 36.0–46.0)
Hemoglobin: 10.4 g/dL — ABNORMAL LOW (ref 12.0–15.0)
Immature Granulocytes: 0 %
Lymphocytes Relative: 21 %
Lymphs Abs: 1 10*3/uL (ref 0.7–4.0)
MCH: 31.9 pg (ref 26.0–34.0)
MCHC: 31.6 g/dL (ref 30.0–36.0)
MCV: 100.9 fL — ABNORMAL HIGH (ref 80.0–100.0)
Monocytes Absolute: 0.5 10*3/uL (ref 0.1–1.0)
Monocytes Relative: 11 %
Neutro Abs: 2.9 10*3/uL (ref 1.7–7.7)
Neutrophils Relative %: 65 %
Platelets: 227 10*3/uL (ref 150–400)
RBC: 3.26 MIL/uL — ABNORMAL LOW (ref 3.87–5.11)
RDW: 15.5 % (ref 11.5–15.5)
WBC: 4.6 10*3/uL (ref 4.0–10.5)
nRBC: 0 % (ref 0.0–0.2)

## 2023-06-20 LAB — BASIC METABOLIC PANEL
Anion gap: 5 (ref 5–15)
BUN: 25 mg/dL — ABNORMAL HIGH (ref 8–23)
CO2: 25 mmol/L (ref 22–32)
Calcium: 10.2 mg/dL (ref 8.9–10.3)
Chloride: 109 mmol/L (ref 98–111)
Creatinine, Ser: 1.14 mg/dL — ABNORMAL HIGH (ref 0.44–1.00)
GFR, Estimated: 48 mL/min — ABNORMAL LOW (ref >60)
Glucose, Bld: 147 mg/dL — ABNORMAL HIGH (ref 70–99)
Potassium: 3.7 mmol/L (ref 3.5–5.1)
Sodium: 139 mmol/L (ref 135–145)

## 2023-06-20 LAB — IRON AND TIBC
Iron: 28 ug/dL (ref 28–170)
Saturation Ratios: 8 % — ABNORMAL LOW (ref 10.4–31.8)
TIBC: 347 ug/dL (ref 250–450)
UIBC: 319 ug/dL

## 2023-06-20 LAB — SAMPLE TO BLOOD BANK

## 2023-06-20 LAB — FERRITIN: Ferritin: 33 ng/mL (ref 11–307)

## 2023-06-28 ENCOUNTER — Institutional Professional Consult (permissible substitution): Payer: PPO | Admitting: Cardiology

## 2023-06-30 ENCOUNTER — Inpatient Hospital Stay: Payer: PPO | Attending: Physician Assistant | Admitting: Oncology

## 2023-06-30 VITALS — BP 132/53 | HR 63 | Temp 98.7°F | Resp 16 | Wt 206.2 lb

## 2023-06-30 DIAGNOSIS — E039 Hypothyroidism, unspecified: Secondary | ICD-10-CM | POA: Insufficient documentation

## 2023-06-30 DIAGNOSIS — E559 Vitamin D deficiency, unspecified: Secondary | ICD-10-CM | POA: Insufficient documentation

## 2023-06-30 DIAGNOSIS — K219 Gastro-esophageal reflux disease without esophagitis: Secondary | ICD-10-CM | POA: Insufficient documentation

## 2023-06-30 DIAGNOSIS — Z86718 Personal history of other venous thrombosis and embolism: Secondary | ICD-10-CM | POA: Insufficient documentation

## 2023-06-30 DIAGNOSIS — D631 Anemia in chronic kidney disease: Secondary | ICD-10-CM | POA: Diagnosis not present

## 2023-06-30 DIAGNOSIS — I129 Hypertensive chronic kidney disease with stage 1 through stage 4 chronic kidney disease, or unspecified chronic kidney disease: Secondary | ICD-10-CM | POA: Insufficient documentation

## 2023-06-30 DIAGNOSIS — Z87442 Personal history of urinary calculi: Secondary | ICD-10-CM | POA: Insufficient documentation

## 2023-06-30 DIAGNOSIS — Z86711 Personal history of pulmonary embolism: Secondary | ICD-10-CM | POA: Diagnosis not present

## 2023-06-30 DIAGNOSIS — K5900 Constipation, unspecified: Secondary | ICD-10-CM | POA: Insufficient documentation

## 2023-06-30 DIAGNOSIS — E1122 Type 2 diabetes mellitus with diabetic chronic kidney disease: Secondary | ICD-10-CM | POA: Insufficient documentation

## 2023-06-30 DIAGNOSIS — Z800 Family history of malignant neoplasm of digestive organs: Secondary | ICD-10-CM | POA: Insufficient documentation

## 2023-06-30 DIAGNOSIS — R519 Headache, unspecified: Secondary | ICD-10-CM | POA: Diagnosis not present

## 2023-06-30 DIAGNOSIS — Z7901 Long term (current) use of anticoagulants: Secondary | ICD-10-CM | POA: Diagnosis not present

## 2023-06-30 DIAGNOSIS — K449 Diaphragmatic hernia without obstruction or gangrene: Secondary | ICD-10-CM | POA: Insufficient documentation

## 2023-06-30 DIAGNOSIS — Z79899 Other long term (current) drug therapy: Secondary | ICD-10-CM | POA: Diagnosis not present

## 2023-06-30 DIAGNOSIS — N1832 Chronic kidney disease, stage 3b: Secondary | ICD-10-CM | POA: Diagnosis not present

## 2023-06-30 DIAGNOSIS — I4891 Unspecified atrial fibrillation: Secondary | ICD-10-CM | POA: Insufficient documentation

## 2023-06-30 DIAGNOSIS — D500 Iron deficiency anemia secondary to blood loss (chronic): Secondary | ICD-10-CM | POA: Diagnosis not present

## 2023-06-30 DIAGNOSIS — Z79890 Hormone replacement therapy (postmenopausal): Secondary | ICD-10-CM | POA: Diagnosis not present

## 2023-06-30 DIAGNOSIS — K31811 Angiodysplasia of stomach and duodenum with bleeding: Secondary | ICD-10-CM | POA: Insufficient documentation

## 2023-06-30 DIAGNOSIS — Z85828 Personal history of other malignant neoplasm of skin: Secondary | ICD-10-CM | POA: Diagnosis not present

## 2023-06-30 DIAGNOSIS — Z7984 Long term (current) use of oral hypoglycemic drugs: Secondary | ICD-10-CM | POA: Diagnosis not present

## 2023-06-30 NOTE — Progress Notes (Addendum)
[facility] [address], [place] [address]   CLINIC:  Medical Oncology/Hematology  PCP:  Gabriel Earing, FNP [address] [place] [address] [phone]   REASON FOR VISIT:  Follow-up for iron deficiency anemia   PRIOR THERAPY: Blood transfusions and oral iron tablets   CURRENT THERAPY: Intermittent IV iron  INTERVAL HISTORY:   [name] [name] 82 y.o. female returns for routine follow-up of her iron deficiency anemia.  She was last seen by me on 05/05/23.   In the interim, she had Watchman device placed on 06/15/2023 for atrial fibrillation he was felt to be a poor candidate for long-term anticoagulation.  Procedure went well.  She was instructed to reduce Eliquis to 5 mg daily for 45 days and then stop.  She will then transition to Plavix 75 mg daily for 6 months.  She is felt well post Watchman device placement.  Appetite is 100% energy levels are 50%.  Denies any pain.  Has cough occasionally with constipation.  Has intermittent headaches.  She received 2 doses of IV Feraheme on 05/09/2023 and 05/18/2023.  She continues to deny any noticeable bleeding per rectum, melena or hematochezia.  She is taking iron 325 mg supplements daily without any GI upset.  Reports her PCP took her off her vitamin D supplements which she had been on for years.  She is curious as to what her levels are.  Had EGD on 02/01/2023 following a significant drop in her hemoglobin which showed a small hiatal hernia, gastric antral vascular ectasia without bleeding which was treated with ACP and single duodenal polyp that was resected.   ASSESSMENT & PLAN:  1.  Iron deficiency anemia secondary to GI blood loss - Normocytic anemia secondary to chronic blood loss, may also have some anemia related to CKD stage IIIb - Hematology work-up (02/24/2022):  Ferritin 11, iron saturation 7%.  Normal folate, B12, MMA, copper. Reticulocytes 3.9% (consistent with acute blood loss), LDH normal,  haptoglobin normal, DAT negative SPEP negative for monoclonal protein, but showed elevation in acute phase proteins - She has required intermittent blood transfusions, most recently on 02/24/2022 (Hgb 6.7) and again on 03/03/2022 (Hgb 6.6).  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  Normal duodenum and jejunum. - Colonoscopy (12/08/2019): Diverticulosis and polyps -Had EGD on 02/01/23 which showed small hiatal hernia, gastric antral vascular ectasia without bleeding that was treated with argon plasma coagulation (ACP) and a single duodenal polyp that was resected and retrieved - She denies any overt melena - She is on Eliquis 2.5 mg twice daily.  She takes iron tablet once daily.  - Most recent IV Feraheme on on 05/09/2023 and 05/21/2023. -She received a blood transfusion in ED on 01/27/23 for hemoglobin of 6.7.   2.   Unprovoked right leg DVT and PE: - Doppler on 04/03/2019: DVT in the femoral vein and calf veins. - CT chest PE protocol (04/03/2019): Moderate size pulmonary emboli in the right lower lobe. - She is on Eliquis 2.5 mg twice daily, also for Afib   3.  Social/family history: - She lives at home with her older son.  She takes care of the garden and does laundry and her ADLs and IADLs. She was a retired tobacco former.  Never smoked. - Sister had anemia.  Mother had uterine cancer and sister had colon cancer.   Plan: Iron deficiency anemia secondary to GI bleed: -Labs from 06/20/2023 show hemoglobin of 10.4 (12.2),  ferritin 33 (21) and iron saturation 8%. -Recommend 2 additional doses of IV Feraheme. - She would like to try to increase her iron tablet to twice daily and see if she is able to tolerate an effort to avoid frequent IV iron infusions. -We did discuss that unfortunately sometimes as we get older we do not absorb iron as well as we did and this may not help but we can certainly try.  We also discussed iron rich foods. -Recommend returning to clinic  in approximately 3 months for follow-up and labs a few days before. -We discussed that although her hemoglobin has improved dramatically, her ferritin is still fairly low.  Would recommend ferritin between 50 and 100 and iron saturations between 20 and 30%.  2.  Atrial fibrillation: - Had Watchman device placed on 06/15/2023 with good tolerance.  She is currently on 5 mg Eliquis daily for 45 days and then will switch to Plavix 75 mg for 6 months. At that point, she will be able to come off anticoagulation.  3.  Vitamin D deficiency: -She is currently not taking supplements. -Last vitamin D level was checked on 04/20/2023 which was normal at 34.2.  We will recheck at next visit.    PLAN SUMMARY: >> IV Feraheme x 2 >> Labs in 3 months = CBC/D, ferritin, iron/TIBC, BMP >> RTC 1 week later for review of labs and assessment.     REVIEW OF SYSTEMS:   Review of Systems  Respiratory:  Positive for cough and shortness of breath.   Gastrointestinal:  Positive for constipation.  Neurological:  Positive for headaches.     PHYSICAL EXAM:  ECOG PERFORMANCE STATUS: 2 - Symptomatic, <50% confined to bed  Vitals:   06/30/23 0954  BP: (!) 132/53  Pulse: 63  Resp: 16  Temp: 98.7 F (37.1 C)  SpO2: 100%   Filed Weights   06/30/23 0954  Weight: 206 lb 3.2 oz (93.5 kg)   Physical Exam Constitutional:      Appearance: Normal appearance.  Cardiovascular:     Rate and Rhythm: Normal rate and regular rhythm.  Pulmonary:     Effort: Pulmonary effort is normal.     Breath sounds: Normal breath sounds.  Abdominal:     General: Bowel sounds are normal.     Palpations: Abdomen is soft.  Musculoskeletal:        General: No swelling. Normal range of motion.  Neurological:     Mental Status: She is alert and oriented to person, place, and time. Mental status is at baseline.     PAST MEDICAL/SURGICAL HISTORY:  Past Medical History:  Diagnosis Date   Anemia    Arthritis    Ostearthritis-  hips, knees, fingers   Atrial fibrillation (HCC)    Basal cell carcinoma (BCC) of right hand 10/2021   Dx by [facility]   DVT (deep venous thrombosis) (HCC)    Dyspnea    Dysrhythmia    GERD (gastroesophageal reflux disease)    Headache(784.0)    tx. Valproic acid   History of diabetes mellitus    History of kidney stones    Hypertension    Hypothyroidism    Presence of Watchman left atrial appendage closure device 06/15/2023   24mm Watchman FLX Pro device placed by Dr. Lalla Brothers   Pulmonary embolism [facility])    Past Surgical History:  Procedure Laterality Date   ABDOMINAL HYSTERECTOMY     BILATERAL HIP ARTHROSCOPY Left    BIOPSY  12/08/2019  Procedure: BIOPSY;  Surgeon: Corbin Ade, MD;  [facility];  Service: Endoscopy;;   CATARACT EXTRACTION, BILATERAL     COLONOSCOPY N/A 12/08/2019   polyps (tubular adenoma), diverticulosis, colonic lipoma, no surveillance due to age   CYSTOSCOPY W/ URETERAL STENT PLACEMENT Right 12/25/2020   Procedure: CYSTOSCOPY WITH RETROGRADE PYELOGRAM/URETERAL STENT PLACEMENT;  Surgeon: Jannifer Hick, MD;  Location: [facility];  Service: Urology;  Laterality: Right;   CYSTOSCOPY/URETEROSCOPY/HOLMIUM LASER/STENT PLACEMENT Right 01/15/2021   Procedure: CYSTOSCOPY/RETROGRADE/URETEROSCOPY/HOLMIUM LASER/STENT EXCHANGE;  Surgeon: Jannifer Hick, MD;  Location: [facility];  Service: Urology;  Laterality: Right;   ESOPHAGOGASTRODUODENOSCOPY N/A 12/08/2019   normal esophagus with 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   ESOPHAGOGASTRODUODENOSCOPY (EGD) WITH PROPOFOL N/A 05/18/2021   Surgeon: Vida Rigger, MD;   Tiny hiatal hernia, gastric antral vascular ectasia s/p ablation, normal duodenum, normal jejunum.   ESOPHAGOGASTRODUODENOSCOPY (EGD) WITH PROPOFOL N/A 03/10/2022   Procedure: ESOPHAGOGASTRODUODENOSCOPY (EGD) WITH PROPOFOL;  Surgeon: Lanelle Bal, DO;  [facility];  Service: Endoscopy;  Laterality:  N/A;  10:45am   ESOPHAGOGASTRODUODENOSCOPY (EGD) WITH PROPOFOL N/A 02/01/2023   Procedure: ESOPHAGOGASTRODUODENOSCOPY (EGD) WITH PROPOFOL;  Surgeon: Lanelle Bal, DO;  [facility];  Service: Endoscopy;  Laterality: N/A;  9:00 am, asa 3   GIVENS CAPSULE STUDY N/A 01/13/2020   Procedure: GIVENS CAPSULE STUDY;  Surgeon: Corbin Ade, MD;  [facility];  Service: Endoscopy;  Laterality: N/A;  7:30am   GIVENS CAPSULE STUDY N/A 05/04/2021   Procedure: GIVENS CAPSULE STUDY;  Surgeon: Vida Rigger, MD;  [facility];  Service: Endoscopy;  Laterality: N/A;   LEFT ATRIAL APPENDAGE OCCLUSION N/A 06/15/2023   Procedure: LEFT ATRIAL APPENDAGE OCCLUSION;  Surgeon: Lanier Prude, MD;  [facility]: [facility];  Service: Cardiovascular;  Laterality: N/A;   MULTIPLE TOOTH EXTRACTIONS     60's   PARATHYROIDECTOMY     POLYPECTOMY  12/08/2019   Procedure: POLYPECTOMY;  Surgeon: Corbin Ade, MD;  [facility];  Service: Endoscopy;;   POLYPECTOMY  02/01/2023   Procedure: POLYPECTOMY;  Surgeon: Lanelle Bal, DO;  [facility];  Service: Endoscopy;;   RADIOFREQUENCY ABLATION  05/18/2021   Procedure: RADIO FREQUENCY ABLATION;  Surgeon: Vida Rigger, MD;  [facility];  Service: Endoscopy;;   TEE WITHOUT CARDIOVERSION N/A 07/06/2021   Procedure: TRANSESOPHAGEAL ECHOCARDIOGRAM (TEE);  Surgeon: Little Ishikawa, MD;  Location: [facility] ENDOSCOPY;  Service: Cardiovascular;  Laterality: N/A;   TEE WITHOUT CARDIOVERSION N/A 06/15/2023   Procedure: TRANSESOPHAGEAL ECHOCARDIOGRAM;  Surgeon: Lanier Prude, MD;  Location: [facility] INVASIVE CV LAB;  Service: Cardiovascular;  Laterality: N/A;   TOTAL HIP ARTHROPLASTY Right 10/29/2013   Procedure: RIGHT TOTAL HIP ARTHROPLASTY ANTERIOR APPROACH;  Surgeon: Shelda Pal, MD;  [facility];  Service: Orthopedics;  Laterality: Right;    SOCIAL HISTORY:  Social History   Socioeconomic  History   Marital status: Widowed    Spouse name: Not on file   Number of children: 4   Years of education: 64   Highest education level: 12th grade  Occupational History   Occupation: Retired    Comment: Tobacco Farming  Tobacco Use   Smoking status: Never   Smokeless tobacco: Never  Vaping Use   Vaping status: Never Used  Substance and Sexual Activity   Alcohol use: No    Alcohol/week: 0.0 standard drinks of alcohol   Drug use: No   Sexual  activity: Not Currently  Other Topics Concern   Not on file  Social History Narrative   Patient is widowed and lives in a one story home. She has four adult children and one son lives with her.       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   Social Determinants of Health   Financial Resource Strain: Low Risk  (12/27/2022)   Overall Financial Resource Strain (CARDIA)    Difficulty of Paying Living Expenses: Not hard at all  Food Insecurity: No Food Insecurity (06/15/2023)   Hunger Vital Sign    Worried About Running Out of Food in the Last Year: Never true    Ran Out of Food in the Last Year: Never true  Transportation Needs: No Transportation Needs (06/15/2023)   PRAPARE - Administrator, Civil Service (Medical): No    Lack of Transportation (Non-Medical): No  Physical Activity: Insufficiently Active (12/27/2022)   Exercise Vital Sign    Days of Exercise per Week: 3 days    Minutes of Exercise per Session: 30 min  Stress: No Stress Concern Present (12/27/2022)   [employer] of Occupational Health - Occupational Stress Questionnaire    Feeling of Stress : Not at all  Social Connections: Moderately Isolated (12/27/2022)   Social Connection and Isolation Panel [NHANES]    Frequency of Communication with Friends and Family: More than three times a week    Frequency of Social Gatherings with Friends and Family: More than three times a week    Attends Religious Services: More than 4 times per year    Active Member of [employer] or  Organizations: No    Attends Banker Meetings: Never    Marital Status: Widowed  Intimate Partner Violence: Not At Risk (06/15/2023)   Humiliation, Afraid, Rape, and Kick questionnaire    Fear of Current or Ex-Partner: No    Emotionally Abused: No    Physically Abused: No    Sexually Abused: No    FAMILY HISTORY:  Family History  Problem Relation Age of Onset   Colitis Sister 57       alive   Cancer Sister 10       colon   Cancer Mother 60       uterine   Heart disease Father 60       heart failure   Heart attack Brother 46   Healthy Daughter    Healthy Son    Pulmonary embolism Brother 46   Heart disease Brother    Healthy Son    Healthy Son     CURRENT MEDICATIONS:  Outpatient Encounter Medications as of 06/30/2023  Medication Sig   acetaminophen (TYLENOL) 325 MG tablet Take 2 tablets (650 mg total) by mouth every 6 (six) hours as needed for mild pain or headache (or Fever >/= 101).   allopurinol (ZYLOPRIM) 100 MG tablet Take 1 tablet (100 mg total) by mouth daily.   amLODipine (NORVASC) 5 MG tablet Take 1 tablet (5 mg total) by mouth daily.   apixaban (ELIQUIS) 2.5 MG TABS tablet Take 1 tablet (2.5 mg total) by mouth 2 (two) times daily.   atorvastatin (LIPITOR) 20 MG tablet Take 1 tablet by mouth once daily   Cyanocobalamin (B-12 PO) Take 1 capsule by mouth daily.   dapagliflozin propanediol (FARXIGA) 10 MG TABS tablet Take 1 tablet (10 mg total) by mouth daily.   ferrous sulfate 325 (65 FE) MG EC tablet Take 325 mg by mouth  daily.   furosemide (LASIX) 20 MG tablet Take 1 tablet by mouth once daily   GNP CRANBERRY EXTRACT PO Take 1 tablet by mouth in the morning.   levothyroxine (SYNTHROID) 50 MCG tablet TAKE 1 TABLET BY MOUTH ONCE DAILY BEFORE BREAKFAST   loratadine (CLARITIN) 10 MG tablet Take 10 mg by mouth daily.   losartan (COZAAR) 25 MG tablet Take 1 tablet (25 mg total) by mouth daily.   oxybutynin (DITROPAN-XL) 10 MG 24 hr tablet Take 1 tablet  (10 mg total) by mouth at bedtime.   pantoprazole (PROTONIX) 40 MG tablet Take 1 tablet (40 mg total) by mouth 2 (two) times daily before a meal.   No facility-administered encounter medications on file as of 06/30/2023.    ALLERGIES:  No Known Allergies  LABORATORY DATA:  I have reviewed the labs as listed.  CBC    Component Value Date/Time   WBC 4.6 06/20/2023 1128   RBC 3.26 (L) 06/20/2023 1128   HGB 10.4 (L) 06/20/2023 1128   HGB 11.0 (L) 06/05/2023 0959   HCT 32.9 (L) 06/20/2023 1128   HCT 35.1 06/05/2023 0959   PLT 227 06/20/2023 1128   PLT 216 06/05/2023 0959   MCV 100.9 (H) 06/20/2023 1128   MCV 99 (H) 06/05/2023 0959   MCH 31.9 06/20/2023 1128   MCHC 31.6 06/20/2023 1128   RDW 15.5 06/20/2023 1128   RDW 14.4 06/05/2023 0959   LYMPHSABS 1.0 06/20/2023 1128   LYMPHSABS 0.9 12/02/2022 0957   MONOABS 0.5 06/20/2023 1128   EOSABS 0.1 06/20/2023 1128   EOSABS 0.1 12/02/2022 0957   BASOSABS 0.0 06/20/2023 1128   BASOSABS 0.0 12/02/2022 0957      Latest Ref Rng & Units 06/20/2023   11:28 AM 06/05/2023    9:59 AM 04/27/2023   10:44 AM  CMP  Glucose 70 - 99 mg/dL 409  811  914   BUN 8 - 23 mg/dL 25  26  26    Creatinine 0.44 - 1.00 mg/dL 7.82  9.56  2.13   Sodium 135 - 145 mmol/L 139  144  138   Potassium 3.5 - 5.1 mmol/L 3.7  4.4  4.1   Chloride 98 - 111 mmol/L 109  110  106   CO2 22 - 32 mmol/L 25  23  22    Calcium 8.9 - 10.3 mg/dL 08.6  57.8  46.9     DIAGNOSTIC IMAGING:  I have independently reviewed the relevant imaging and discussed with the patient.   WRAP UP:  All questions were answered. The patient knows to call the clinic with any problems, questions or concerns.  Medical decision making: Moderate  Time spent on visit: I spent 25 minutes dedicated to the care of this patient (face-to-face and non-face-to-face) on the date of the encounter to include what is described in the assessment and plan.   Mauro Kaufmann, NP  09/27/2022 11:03 AM

## 2023-07-04 ENCOUNTER — Other Ambulatory Visit: Payer: Self-pay | Admitting: *Deleted

## 2023-07-04 MED ORDER — PANTOPRAZOLE SODIUM 40 MG PO TBEC: 40.0000 mg | DELAYED_RELEASE_TABLET | Freq: Two times a day (BID) | ORAL | 0 refills | Status: DC

## 2023-07-04 MED ORDER — OXYBUTYNIN CHLORIDE ER 10 MG PO TB24: 10.0000 mg | ORAL_TABLET | Freq: Every day | ORAL | 0 refills | Status: DC

## 2023-07-06 ENCOUNTER — Inpatient Hospital Stay: Payer: PPO

## 2023-07-06 VITALS — BP 163/60 | HR 50 | Temp 96.8°F | Resp 18

## 2023-07-06 DIAGNOSIS — D500 Iron deficiency anemia secondary to blood loss (chronic): Secondary | ICD-10-CM | POA: Diagnosis not present

## 2023-07-06 MED ORDER — SODIUM CHLORIDE 0.9 % IV SOLN
510.0000 mg | Freq: Once | INTRAVENOUS | Status: AC
  Administered 2023-07-06: 510 mg via INTRAVENOUS
  Filled 2023-07-06: qty 510

## 2023-07-06 MED ORDER — SODIUM CHLORIDE 0.9 % IV SOLN
Freq: Once | INTRAVENOUS | Status: AC
Start: 2023-07-06 — End: 2023-07-06

## 2023-07-06 MED ORDER — ACETAMINOPHEN 325 MG PO TABS: 650.0000 mg | ORAL_TABLET | Freq: Once | ORAL | Status: DC

## 2023-07-06 MED ORDER — CETIRIZINE HCL 10 MG PO TABS: 10.0000 mg | ORAL_TABLET | Freq: Once | ORAL | Status: DC

## 2023-07-06 NOTE — Progress Notes (Signed)
Patient tolerated iron infusion with no complaints voiced.  Peripheral IV site clean and dry with good blood return noted before and after infusion.  Band aid applied. Pt refuse to wait the 30 minute post observation.  VSS with discharge and left in satisfactory condition with no s/s of distress noted. Pt escorted via wheelchair to be driven home by son.   [name] [name] [employer]

## 2023-07-06 NOTE — Patient Instructions (Signed)
 Ferumoxytol Injection What is this medication? FERUMOXYTOL (FER ue MOX i tol) treats low levels of iron in your body (iron deficiency anemia). Iron is a mineral that plays an important role in making red blood cells, which carry oxygen from your lungs to the rest of your body. This medicine may be used for other purposes; ask your health care provider or pharmacist if you have questions. COMMON BRAND NAME(S): Feraheme What should I tell my care team before I take this medication? They need to know if you have any of these conditions: Anemia not caused by low iron levels High levels of iron in the blood Magnetic resonance imaging (MRI) test scheduled An unusual or allergic reaction to iron, other medications, foods, dyes, or preservatives Pregnant or trying to get pregnant Breastfeeding How should I use this medication? This medication is injected into a vein. It is given by your care team in a hospital or clinic setting. Talk to your care team the use of this medication in children. Special care may be needed. Overdosage: If you think you have taken too much of this medicine contact a poison control center or emergency room at once. NOTE: This medicine is only for you. Do not share this medicine with others. What if I miss a dose? It is important not to miss your dose. Call your care team if you are unable to keep an appointment. What may interact with this medication? Other iron products This list may not describe all possible interactions. Give your health care provider a list of all the medicines, herbs, non-prescription drugs, or dietary supplements you use. Also tell them if you smoke, drink alcohol, or use illegal drugs. Some items may interact with your medicine. What should I watch for while using this medication? Visit your care team regularly. Tell your care team if your symptoms do not start to get better or if they get worse. You may need blood work done while you are taking this  medication. You may need to follow a special diet. Talk to your care team. Foods that contain iron include: whole grains/cereals, dried fruits, beans, or peas, leafy green vegetables, and organ meats (liver, kidney). What side effects may I notice from receiving this medication? Side effects that you should report to your care team as soon as possible: Allergic reactions--skin rash, itching, hives, swelling of the face, lips, tongue, or throat Low blood pressure--dizziness, feeling faint or lightheaded, blurry vision Shortness of breath Side effects that usually do not require medical attention (report to your care team if they continue or are bothersome): Flushing Headache Joint pain Muscle pain Nausea Pain, redness, or irritation at injection site This list may not describe all possible side effects. Call your doctor for medical advice about side effects. You may report side effects to FDA at 1-800-FDA-1088. Where should I keep my medication? This medication is given in a hospital or clinic. It will not be stored at home. NOTE: This sheet is a summary. It may not cover all possible information. If you have questions about this medicine, talk to your doctor, pharmacist, or health care provider.  2024 Elsevier/Gold Standard (2022-12-16 00:00:00)

## 2023-07-10 ENCOUNTER — Other Ambulatory Visit: Payer: Self-pay | Admitting: *Deleted

## 2023-07-10 DIAGNOSIS — M1A9XX0 Chronic gout, unspecified, without tophus (tophi): Secondary | ICD-10-CM

## 2023-07-10 MED ORDER — ALLOPURINOL 100 MG PO TABS: 100.0000 mg | ORAL_TABLET | Freq: Every day | ORAL | 0 refills | Status: DC

## 2023-07-13 ENCOUNTER — Inpatient Hospital Stay: Payer: PPO

## 2023-07-13 VITALS — BP 173/54 | HR 35 | Temp 96.5°F | Resp 16

## 2023-07-13 DIAGNOSIS — D500 Iron deficiency anemia secondary to blood loss (chronic): Secondary | ICD-10-CM

## 2023-07-13 MED ORDER — SODIUM CHLORIDE 0.9 % IV SOLN
510.0000 mg | Freq: Once | INTRAVENOUS | Status: AC
Start: 2023-07-13 — End: 2023-07-13
  Administered 2023-07-13: 510 mg via INTRAVENOUS
  Filled 2023-07-13: qty 17

## 2023-07-13 MED ORDER — SODIUM CHLORIDE 0.9 % IV SOLN: Freq: Once | INTRAVENOUS | Status: AC

## 2023-07-13 NOTE — Progress Notes (Signed)
Patient tolerated iron infusion with no complaints voiced. Patient refused to wait 30 minute wait time. Peripheral IV site clean and dry with good blood return noted before and after infusion. Band aid applied. VSS with discharge and left in satisfactory condition with no s/s of distress noted.

## 2023-07-13 NOTE — Patient Instructions (Signed)
[facility] [place] - A DEPT OF [facility]  Discharge Instructions: Thank you for choosing [facility] to provide your oncology and hematology care.  If you have a lab appointment with the Cancer Center - please note that after April 8th, 2024, all labs will be drawn in the cancer center.  You do not have to check in or register with the main entrance as you have in the past but will complete your check-in in the cancer center.  Wear comfortable clothing and clothing appropriate for easy access to any Portacath or PICC line.   We strive to give you quality time with your provider. You may need to reschedule your appointment if you arrive late (15 or more minutes).  Arriving late affects you and other patients whose appointments are after yours.  Also, if you miss three or more appointments without notifying the office, you may be dismissed from the clinic at the provider's discretion.      For prescription refill requests, have your pharmacy contact our office and allow 72 hours for refills to be completed.    Today you received the following Feraheme, return as scheduled.   To help prevent nausea and vomiting after your treatment, we encourage you to take your nausea medication as directed.  BELOW ARE SYMPTOMS THAT SHOULD BE REPORTED IMMEDIATELY: *FEVER GREATER THAN 100.4 F (38 C) OR HIGHER *CHILLS OR SWEATING *NAUSEA AND VOMITING THAT IS NOT CONTROLLED WITH YOUR NAUSEA MEDICATION *UNUSUAL SHORTNESS OF BREATH *UNUSUAL BRUISING OR BLEEDING *URINARY PROBLEMS (pain or burning when urinating, or frequent urination) *BOWEL PROBLEMS (unusual diarrhea, constipation, pain near the anus) TENDERNESS IN MOUTH AND THROAT WITH OR WITHOUT PRESENCE OF ULCERS (sore throat, sores in mouth, or a toothache) UNUSUAL RASH, SWELLING OR PAIN  UNUSUAL VAGINAL DISCHARGE OR ITCHING   Items with * indicate a potential emergency and should be followed up as soon as possible or  go to the Emergency Department if any problems should occur.  Please show the CHEMOTHERAPY ALERT CARD or IMMUNOTHERAPY ALERT CARD at check-in to the Emergency Department and triage nurse.  Should you have questions after your visit or need to cancel or reschedule your appointment, please contact [facility] [place] - A DEPT OF Eligha Brideg[facility] [phone]  and follow the prompts.  Office hours are 8:00 a.m. to 4:30 p.m. Monday - Friday. Please note that voicemails left after 4:00 p.m. may not be returned until the following business day.  We are closed weekends and major holidays. You have access to a nurse at all times for urgent questions. Please call the main number to the clinic [phone] and follow the prompts.  For any non-urgent questions, you may also contact your provider using MyChart. We now offer e-Visits for anyone 71 and older to request care online for non-urgent symptoms. For details visit [email or web address].   Also download the MyChart app! Go to the app store, search "MyChart", open the app, select [place], and log in with your MyChart username and password.

## 2023-07-21 DIAGNOSIS — Z95818 Presence of other cardiac implants and grafts: Secondary | ICD-10-CM | POA: Diagnosis not present

## 2023-07-21 DIAGNOSIS — I480 Paroxysmal atrial fibrillation: Secondary | ICD-10-CM | POA: Diagnosis not present

## 2023-07-21 DIAGNOSIS — N1831 Chronic kidney disease, stage 3a: Secondary | ICD-10-CM | POA: Diagnosis not present

## 2023-07-21 DIAGNOSIS — Z9889 Other specified postprocedural states: Secondary | ICD-10-CM | POA: Diagnosis not present

## 2023-07-25 NOTE — Progress Notes (Addendum)
 [facility]   MULTIDISCIPLINARY HEART VALVE CLINIC                                     Cardiology Office Note:    Date:  07/28/2023   ID:  [name] [name], DOB 10-11-1940, MRN [id number]  PCP:  Albertha Huger, FNP  [facility] Cardiologist:  Eilleen Grates, MD  [facility] Electrophysiologist:  None   Referring MD: Albertha Huger, FNP   1 month s/p LAAO   History of Present Illness:    [name] [name] is a 82 y.o. female with a hx of HFimpEF, mitral valve disease, HTN, CKD stage IIIa, iron deficiency anemia, GI bleeding, aortic atherosclerosis (noted on 12/2020 CT) and persistent atrial fibrillation s/p LAAO with Watchman (06/15/23) who presents to clinic for follow up.   She was felt to be a poor long term candidate for oral anticoagulation due to iron deficiency anemia and GI bleeding. She underwent left atrial appendage occlusion with a 24mm Watchman FLX Pro device on 06/15/23 by Dr. Marven Slimmer. Medication plan was to continue Eliquis  2.5mg  BID for 45 days after implant. After 45 days, stop Eliquis  and start Plavix  75mg  daily to complete 6 months of post implant therapy. Plan TEE 60 days after implant to assess appendage patency and Watchman position. This has been set up for 08/18/23.  Today the patient presents to clinic for follow up. Here with her daughter in law. She is doing quite well. No CP or SOB. No LE edema, orthopnea or PND. No dizziness or syncope. No blood in stool or urine. No palpitations.    Past Medical History:  Diagnosis Date   Anemia    Arthritis    Ostearthritis- hips, knees, fingers   Atrial fibrillation (HCC)    Basal cell carcinoma (BCC) of right hand 10/2021   Dx by [facility]   DVT (deep venous thrombosis) (HCC)    Dyspnea    Dysrhythmia    GERD (gastroesophageal reflux disease)    Headache(784.0)    tx. Valproic  acid   History of diabetes mellitus    History of kidney stones    Hypertension    Hypothyroidism     Presence of Watchman left atrial appendage closure device 06/15/2023   24mm Watchman FLX Pro device placed by Dr. Marven Slimmer   Pulmonary embolism [facility])      Current Medications: Current Meds  Medication Sig   acetaminophen  (TYLENOL ) 325 MG tablet Take 2 tablets (650 mg total) by mouth every 6 (six) hours as needed for mild pain or headache (or Fever >/= 101).   allopurinol  (ZYLOPRIM ) 100 MG tablet Take 1 tablet (100 mg total) by mouth daily.   amLODipine  (NORVASC ) 5 MG tablet Take 1 tablet (5 mg total) by mouth daily.   atorvastatin  (LIPITOR) 20 MG tablet Take 1 tablet by mouth once daily   Cyanocobalamin  (B-12 PO) Take 1 capsule by mouth daily.   dapagliflozin  propanediol (FARXIGA ) 10 MG TABS tablet Take 1 tablet (10 mg total) by mouth daily.   ferrous sulfate  325 (65 FE) MG EC tablet Take 325 mg by mouth daily.   furosemide  (LASIX ) 20 MG tablet Take 1 tablet by mouth once daily   GNP CRANBERRY EXTRACT PO Take 1 tablet by mouth in the morning.   levothyroxine  (SYNTHROID ) 50 MCG tablet TAKE 1 TABLET BY MOUTH ONCE DAILY BEFORE BREAKFAST   loratadine  (CLARITIN )  10 MG tablet Take 10 mg by mouth daily.   losartan  (COZAAR ) 25 MG tablet Take 1 tablet (25 mg total) by mouth daily.   oxybutynin  (DITROPAN -XL) 10 MG 24 hr tablet Take 1 tablet (10 mg total) by mouth at bedtime.   pantoprazole  (PROTONIX ) 40 MG tablet Take 1 tablet (40 mg total) by mouth 2 (two) times daily before a meal.   [DISCONTINUED] apixaban  (ELIQUIS ) 2.5 MG TABS tablet Take 1 tablet (2.5 mg total) by mouth 2 (two) times daily.   [DISCONTINUED] clopidogrel  (PLAVIX ) 75 MG tablet Take 1 tablet (75 mg total) by mouth daily.      ROS:   Please see the history of present illness.    All other systems reviewed and are negative.  EKGs       Risk Assessment/Calculations:    CHA2DS2-VASc Score = 6   This indicates a 9.7% annual risk of stroke. The patient's score is based upon: CHF History: 1 HTN History: 1 Diabetes History:  1 Stroke History: 0 Vascular Disease History: 0 Age Score: 2 Gender Score: 1          Physical Exam:    VS:  BP 110/60   Pulse (!) 46   Ht 5' 3.5" (1.613 m)   Wt 204 lb (92.5 kg)   SpO2 96%   BMI 35.57 kg/m     Wt Readings from Last 3 Encounters:  07/28/23 204 lb (92.5 kg)  06/30/23 206 lb 3.2 oz (93.5 kg)  06/15/23 200 lb (90.7 kg)     GEN: Well nourished, well developed in no acute distress NECK: No JVD CARDIAC: RR, brady, no murmurs, rubs, gallops RESPIRATORY:  Clear to auscultation without rales, wheezing or rhonchi  ABDOMEN: Soft, non-tender, non-distended EXTREMITIES:  No edema; No deformity.  ASSESSMENT:    1. Presence of Watchman left atrial appendage closure device   2. Chronic a-fib (HCC)   3. History of GI bleed   4. Iron deficiency anemia due to chronic blood loss   5. Primary hypertension   6. Heart failure with improved ejection fraction (HFimpEF) (HCC)   7. Aortic atherosclerosis (HCC)     PLAN:    In order of problems listed above:  Persistent atrial fibirllation s/p LAAO with Watchman: she is rate controlled on no AV nodal blocking medications. HRs run in 40-50s in sinus and afib. Plan to STOP Eliquis  today and start Plavix  75mg  daily to complete 6 months of post implant therapy. SBE prophylaxis discussed; the patient is edentulous and does not go to the dentist. TEE set up for 08/18/23 to assess appendage patency and Watchman position. BMET/CBC today.   The risks [esophageal damage, perforation (1:10,000 risk), bleeding, pharyngeal hematoma as well as other potential complications associated with conscious sedation including aspiration, arrhythmia, respiratory failure and death], benefits (treatment guidance and diagnostic support) and alternatives of a transesophageal echocardiogram were discussed in detail with [name] [name] and she is willing to proceed.   Hx of GI bleeding: no s/s of active bleeding. Check CBC today  IDA: Hg 10.4 last month.  Check CBC today.   HTN: BP initially elevated at 146/70 but 110/60 on my personal recheck. Continue Losartan  25mg  daily and Norvasc  5mg  daily.   HFimpEF: appears euvolemic. Continue Lasix  20mg  daily and Farixga 10mg  daily.    Medication Adjustments/Labs and Tests Ordered: Current medicines are reviewed at length with the patient today.  Concerns regarding medicines are outlined above.  Orders Placed This Encounter  Procedures   Basic metabolic  panel   CBC   Meds ordered this encounter  Medications   DISCONTD: clopidogrel  (PLAVIX ) 75 MG tablet    Sig: Take 1 tablet (75 mg total) by mouth daily.    Dispense:  90 tablet    Refill:  3   clopidogrel  (PLAVIX ) 75 MG tablet    Sig: Take 1 tablet (75 mg total) by mouth daily.    Dispense:  90 tablet    Refill:  1    Patient Instructions  Medication Instructions:  Your physician has recommended you make the following change in your medication:  STOP: apixaban  (Eliquis ) START: clopidogrel  (Plavix ) 75 mg by mouth once daily  *If you need a refill on your cardiac medications before your next appointment, please call your pharmacy*   Lab Work: BMP, CBC  If you have labs (blood work) drawn today and your tests are completely normal, you will receive your results only by: MyChart Message (if you have MyChart) OR A paper copy in the mail If you have any lab test that is abnormal or we need to change your treatment, we will call you to review the results.   Testing/Procedures: NONE   Follow-Up:As scheduled  At [facility], you and your health needs are our priority.  As part of our continuing mission to provide you with exceptional heart care, we have created designated Provider Care Teams.  These Care Teams include your primary Cardiologist (physician) and Advanced Practice Providers (APPs -  Physician Assistants and Nurse Practitioners) who all work together to provide you with the care you need, when you need  it.    Provider:   Jeronimo Moors, PA-C            Signed, Abagail Hoar, PA-C  07/28/2023 1:53 PM     [facility]

## 2023-07-25 NOTE — H&P (View-Only) (Signed)
[facility]ENTER   MULTIDISCIPLINARY HEART VALVE CLINIC                                     Cardiology Office Note:    Date:  07/28/2023   ID:  [name] [name], DOB 1941/03/14, MRN [id number]  PCP:  Gabriel Earing, FNP  [facility] Cardiologist:  Rollene Rotunda, MD  [facility] Electrophysiologist:  None   Referring MD: Gabriel Earing, FNP   1 month s/p LAAO   History of Present Illness:    [name] [name] is a 82 y.o. female with a hx of HFimpEF, mitral valve disease, HTN, CKD stage IIIa, iron deficiency anemia, GI bleeding, aortic atherosclerosis (noted on 12/2020 CT) and persistent atrial fibrillation s/p LAAO with Watchman (06/15/23) who presents to clinic for follow up.   She was felt to be a poor long term candidate for oral anticoagulation due to iron deficiency anemia and GI bleeding. She underwent left atrial appendage occlusion with a 24mm Watchman FLX Pro device on 06/15/23 by Dr. Lalla Brothers. Medication plan was to continue Eliquis 2.5mg  BID for 45 days after implant. After 45 days, stop Eliquis and start Plavix 75mg  daily to complete 6 months of post implant therapy. Plan TEE 60 days after implant to assess appendage patency and Watchman position. This has been set up for 08/18/23.  Today the patient presents to clinic for follow up. Here with her daughter in law. She is doing quite well. No CP or SOB. No LE edema, orthopnea or PND. No dizziness or syncope. No blood in stool or urine. No palpitations.    Past Medical History:  Diagnosis Date   Anemia    Arthritis    Ostearthritis- hips, knees, fingers   Atrial fibrillation (HCC)    Basal cell carcinoma (BCC) of right hand 10/2021   Dx by [facility]   DVT (deep venous thrombosis) (HCC)    Dyspnea    Dysrhythmia    GERD (gastroesophageal reflux disease)    Headache(784.0)    tx. Valproic acid   History of diabetes mellitus    History of kidney stones    Hypertension    Hypothyroidism     Presence of Watchman left atrial appendage closure device 06/15/2023   24mm Watchman FLX Pro device placed by Dr. Lalla Brothers   Pulmonary embolism [facility])      Current Medications: Current Meds  Medication Sig   acetaminophen (TYLENOL) 325 MG tablet Take 2 tablets (650 mg total) by mouth every 6 (six) hours as needed for mild pain or headache (or Fever >/= 101).   allopurinol (ZYLOPRIM) 100 MG tablet Take 1 tablet (100 mg total) by mouth daily.   amLODipine (NORVASC) 5 MG tablet Take 1 tablet (5 mg total) by mouth daily.   atorvastatin (LIPITOR) 20 MG tablet Take 1 tablet by mouth once daily   Cyanocobalamin (B-12 PO) Take 1 capsule by mouth daily.   dapagliflozin propanediol (FARXIGA) 10 MG TABS tablet Take 1 tablet (10 mg total) by mouth daily.   ferrous sulfate 325 (65 FE) MG EC tablet Take 325 mg by mouth daily.   furosemide (LASIX) 20 MG tablet Take 1 tablet by mouth once daily   GNP CRANBERRY EXTRACT PO Take 1 tablet by mouth in the morning.   levothyroxine (SYNTHROID) 50 MCG tablet TAKE 1 TABLET BY MOUTH ONCE DAILY BEFORE BREAKFAST   loratadine (CLARITIN)  10 MG tablet Take 10 mg by mouth daily.   losartan (COZAAR) 25 MG tablet Take 1 tablet (25 mg total) by mouth daily.   oxybutynin (DITROPAN-XL) 10 MG 24 hr tablet Take 1 tablet (10 mg total) by mouth at bedtime.   pantoprazole (PROTONIX) 40 MG tablet Take 1 tablet (40 mg total) by mouth 2 (two) times daily before a meal.   [DISCONTINUED] apixaban (ELIQUIS) 2.5 MG TABS tablet Take 1 tablet (2.5 mg total) by mouth 2 (two) times daily.   [DISCONTINUED] clopidogrel (PLAVIX) 75 MG tablet Take 1 tablet (75 mg total) by mouth daily.      ROS:   Please see the history of present illness.    All other systems reviewed and are negative.  EKGs       Risk Assessment/Calculations:    CHA2DS2-VASc Score = 6   This indicates a 9.7% annual risk of stroke. The patient's score is based upon: CHF History: 1 HTN History: 1 Diabetes History:  1 Stroke History: 0 Vascular Disease History: 0 Age Score: 2 Gender Score: 1          Physical Exam:    VS:  BP 110/60   Pulse (!) 46   Ht 5' 3.5" (1.613 m)   Wt 204 lb (92.5 kg)   SpO2 96%   BMI 35.57 kg/m     Wt Readings from Last 3 Encounters:  07/28/23 204 lb (92.5 kg)  06/30/23 206 lb 3.2 oz (93.5 kg)  06/15/23 200 lb (90.7 kg)     GEN: Well nourished, well developed in no acute distress NECK: No JVD CARDIAC: RRR, no murmurs, rubs, gallops RESPIRATORY:  Clear to auscultation without rales, wheezing or rhonchi  ABDOMEN: Soft, non-tender, non-distended EXTREMITIES:  No edema; No deformity.  ASSESSMENT:    1. Presence of Watchman left atrial appendage closure device   2. Chronic a-fib (HCC)   3. History of GI bleed   4. Iron deficiency anemia due to chronic blood loss   5. Primary hypertension   6. Heart failure with improved ejection fraction (HFimpEF) (HCC)   7. Aortic atherosclerosis (HCC)     PLAN:    In order of problems listed above:  Persistent atrial fibirllation s/p LAAO with Watchman: plan to STOP Eliquis today and start Plavix 75mg  daily to complete 6 months of post implant therapy. SBE prophylaxis discussed; the patient is edentulous and does not go to the dentist. TEE set up for 08/18/23 to assess appendage patency and Watchman position. BMET/CBC today.   The risks [esophageal damage, perforation (1:10,000 risk), bleeding, pharyngeal hematoma as well as other potential complications associated with conscious sedation including aspiration, arrhythmia, respiratory failure and death], benefits (treatment guidance and diagnostic support) and alternatives of a transesophageal echocardiogram were discussed in detail with [name] [name] and she is willing to proceed.   Hx of GI bleeding: no s/s of active bleeding. Check CBC today  IDA: Hg 10.4 last month. Check CBC today.   HTN: BP initially elevated at 146/70 but 110/60 on my personal recheck. Continue  Losartan 25mg  daily and Norvasc 5mg  daily.   HFimpEF: appears euvolemic. Continue Lasix 20mg  daily and Farixga 10mg  daily.   Aortic atherosclerosis: (noted on 12/2020 CT). Anticipate destination therapy with aspirin 81mg  daily once transitioned off Plavix.    Medication Adjustments/Labs and Tests Ordered: Current medicines are reviewed at length with the patient today.  Concerns regarding medicines are outlined above.  Orders Placed This Encounter  Procedures   Basic metabolic  panel   CBC   Meds ordered this encounter  Medications   DISCONTD: clopidogrel (PLAVIX) 75 MG tablet    Sig: Take 1 tablet (75 mg total) by mouth daily.    Dispense:  90 tablet    Refill:  3   clopidogrel (PLAVIX) 75 MG tablet    Sig: Take 1 tablet (75 mg total) by mouth daily.    Dispense:  90 tablet    Refill:  1    Patient Instructions  Medication Instructions:  Your physician has recommended you make the following change in your medication:  STOP: apixaban (Eliquis) START: clopidogrel (Plavix) 75 mg by mouth once daily  *If you need a refill on your cardiac medications before your next appointment, please call your pharmacy*   Lab Work: BMP, CBC  If you have labs (blood work) drawn today and your tests are completely normal, you will receive your results only by: MyChart Message (if you have MyChart) OR A paper copy in the mail If you have any lab test that is abnormal or we need to change your treatment, we will call you to review the results.   Testing/Procedures: NONE   Follow-Up:As scheduled  At [facility], you and your health needs are our priority.  As part of our continuing mission to provide you with exceptional heart care, we have created designated Provider Care Teams.  These Care Teams include your primary Cardiologist (physician) and Advanced Practice Providers (APPs -  Physician Assistants and Nurse Practitioners) who all work together to provide you with the care you  need, when you need it.    Provider:   Carlean Jews, PA-C            Signed, Cline Crock, PA-C  07/28/2023 1:53 PM    [facility]

## 2023-07-28 ENCOUNTER — Ambulatory Visit: Payer: PPO | Attending: Physician Assistant | Admitting: Physician Assistant

## 2023-07-28 VITALS — BP 110/60 | HR 46 | Ht 63.5 in | Wt 204.0 lb

## 2023-07-28 DIAGNOSIS — D500 Iron deficiency anemia secondary to blood loss (chronic): Secondary | ICD-10-CM

## 2023-07-28 DIAGNOSIS — I340 Nonrheumatic mitral (valve) insufficiency: Secondary | ICD-10-CM

## 2023-07-28 DIAGNOSIS — I5032 Chronic diastolic (congestive) heart failure: Secondary | ICD-10-CM | POA: Diagnosis not present

## 2023-07-28 DIAGNOSIS — I10 Essential (primary) hypertension: Secondary | ICD-10-CM

## 2023-07-28 DIAGNOSIS — I700 Atherosclerosis of aorta: Secondary | ICD-10-CM | POA: Diagnosis not present

## 2023-07-28 DIAGNOSIS — Z95818 Presence of other cardiac implants and grafts: Secondary | ICD-10-CM | POA: Diagnosis not present

## 2023-07-28 DIAGNOSIS — Z8719 Personal history of other diseases of the digestive system: Secondary | ICD-10-CM | POA: Diagnosis not present

## 2023-07-28 DIAGNOSIS — I482 Chronic atrial fibrillation, unspecified: Secondary | ICD-10-CM

## 2023-07-28 LAB — CBC

## 2023-07-28 MED ORDER — CLOPIDOGREL BISULFATE 75 MG PO TABS: 75.0000 mg | ORAL_TABLET | Freq: Every day | ORAL | 1 refills | Status: DC

## 2023-07-28 MED ORDER — CLOPIDOGREL BISULFATE 75 MG PO TABS: 75.0000 mg | ORAL_TABLET | Freq: Every day | ORAL | 3 refills | Status: DC

## 2023-07-28 NOTE — Patient Instructions (Signed)
 Medication Instructions:  Your physician has recommended you make the following change in your medication:  STOP: apixaban  (Eliquis ) START: clopidogrel  (Plavix ) 75 mg by mouth once daily  *If you need a refill on your cardiac medications before your next appointment, please call your pharmacy*   Lab Work: BMP, CBC  If you have labs (blood work) drawn today and your tests are completely normal, you will receive your results only by: MyChart Message (if you have MyChart) OR A paper copy in the mail If you have any lab test that is abnormal or we need to change your treatment, we will call you to review the results.   Testing/Procedures: NONE   Follow-Up:As scheduled  At [facility], you and your health needs are our priority.  As part of our continuing mission to provide you with exceptional heart care, we have created designated Provider Care Teams.  These Care Teams include your primary Cardiologist (physician) and Advanced Practice Providers (APPs -  Physician Assistants and Nurse Practitioners) who all work together to provide you with the care you need, when you need it.    Provider:   Izetta Hummer, PA-C

## 2023-07-29 ENCOUNTER — Other Ambulatory Visit: Payer: Self-pay | Admitting: Family Medicine

## 2023-07-29 DIAGNOSIS — E785 Hyperlipidemia, unspecified: Secondary | ICD-10-CM

## 2023-07-29 LAB — CBC
Hematocrit: 36.7 % (ref 34.0–46.6)
Hemoglobin: 11.6 g/dL (ref 11.1–15.9)
MCH: 31 pg (ref 26.6–33.0)
MCHC: 31.6 g/dL (ref 31.5–35.7)
MCV: 98 fL — ABNORMAL HIGH (ref 79–97)
Platelets: 210 10*3/uL (ref 150–450)
RBC: 3.74 x10E6/uL — ABNORMAL LOW (ref 3.77–5.28)
RDW: 13 % (ref 11.7–15.4)
WBC: 5.3 10*3/uL (ref 3.4–10.8)

## 2023-07-29 LAB — BASIC METABOLIC PANEL
BUN/Creatinine Ratio: 24 (ref 12–28)
BUN: 24 mg/dL (ref 8–27)
CO2: 21 mmol/L (ref 20–29)
Calcium: 11.5 mg/dL — ABNORMAL HIGH (ref 8.7–10.3)
Chloride: 108 mmol/L — ABNORMAL HIGH (ref 96–106)
Creatinine, Ser: 0.99 mg/dL (ref 0.57–1.00)
Glucose: 107 mg/dL — ABNORMAL HIGH (ref 70–99)
Potassium: 4.9 mmol/L (ref 3.5–5.2)
Sodium: 142 mmol/L (ref 134–144)
eGFR: 57 mL/min/{1.73_m2} — ABNORMAL LOW (ref >59)

## 2023-08-05 ENCOUNTER — Other Ambulatory Visit: Payer: Self-pay | Admitting: Family Medicine

## 2023-08-05 DIAGNOSIS — E039 Hypothyroidism, unspecified: Secondary | ICD-10-CM

## 2023-08-15 ENCOUNTER — Other Ambulatory Visit (HOSPITAL_COMMUNITY): Payer: PPO

## 2023-08-17 NOTE — Progress Notes (Signed)
Instructed patient to arrive at 0830am, NPO after MN, medications to take, no jewelry, and to have a drive and a person to stay with them for 24 hours at home after the procedure.

## 2023-08-18 ENCOUNTER — Other Ambulatory Visit: Payer: Self-pay

## 2023-08-18 ENCOUNTER — Ambulatory Visit (HOSPITAL_COMMUNITY): Payer: PPO

## 2023-08-18 ENCOUNTER — Telehealth: Payer: Self-pay | Admitting: Physician Assistant

## 2023-08-18 ENCOUNTER — Ambulatory Visit (HOSPITAL_COMMUNITY)
Admission: RE | Admit: 2023-08-18 | Discharge: 2023-08-18 | Disposition: A | Discharge status: Home / Self Care | Payer: PPO | Attending: Cardiovascular Disease | Admitting: Cardiovascular Disease

## 2023-08-18 ENCOUNTER — Encounter (HOSPITAL_COMMUNITY)
Admission: RE | Disposition: A | Discharge status: Home / Self Care | Payer: PPO | Source: Home / Self Care | Attending: Cardiovascular Disease

## 2023-08-18 ENCOUNTER — Ambulatory Visit (HOSPITAL_BASED_OUTPATIENT_CLINIC_OR_DEPARTMENT_OTHER): Payer: PPO

## 2023-08-18 ENCOUNTER — Ambulatory Visit (HOSPITAL_BASED_OUTPATIENT_CLINIC_OR_DEPARTMENT_OTHER)
Admission: RE | Admit: 2023-08-18 | Discharge: 2023-08-18 | Disposition: A | Discharge status: Home / Self Care | Payer: PPO | Source: Ambulatory Visit | Attending: Cardiology | Admitting: Cardiology

## 2023-08-18 DIAGNOSIS — Z7901 Long term (current) use of anticoagulants: Secondary | ICD-10-CM | POA: Insufficient documentation

## 2023-08-18 DIAGNOSIS — I130 Hypertensive heart and chronic kidney disease with heart failure and stage 1 through stage 4 chronic kidney disease, or unspecified chronic kidney disease: Secondary | ICD-10-CM | POA: Insufficient documentation

## 2023-08-18 DIAGNOSIS — I480 Paroxysmal atrial fibrillation: Secondary | ICD-10-CM

## 2023-08-18 DIAGNOSIS — I4891 Unspecified atrial fibrillation: Secondary | ICD-10-CM | POA: Diagnosis not present

## 2023-08-18 DIAGNOSIS — E039 Hypothyroidism, unspecified: Secondary | ICD-10-CM | POA: Diagnosis not present

## 2023-08-18 DIAGNOSIS — I700 Atherosclerosis of aorta: Secondary | ICD-10-CM | POA: Insufficient documentation

## 2023-08-18 DIAGNOSIS — N1831 Chronic kidney disease, stage 3a: Secondary | ICD-10-CM | POA: Diagnosis not present

## 2023-08-18 DIAGNOSIS — I482 Chronic atrial fibrillation, unspecified: Secondary | ICD-10-CM | POA: Insufficient documentation

## 2023-08-18 DIAGNOSIS — I110 Hypertensive heart disease with heart failure: Secondary | ICD-10-CM

## 2023-08-18 DIAGNOSIS — I071 Rheumatic tricuspid insufficiency: Secondary | ICD-10-CM | POA: Diagnosis not present

## 2023-08-18 DIAGNOSIS — Z8719 Personal history of other diseases of the digestive system: Secondary | ICD-10-CM | POA: Insufficient documentation

## 2023-08-18 DIAGNOSIS — Z79899 Other long term (current) drug therapy: Secondary | ICD-10-CM | POA: Insufficient documentation

## 2023-08-18 DIAGNOSIS — I5022 Chronic systolic (congestive) heart failure: Secondary | ICD-10-CM | POA: Diagnosis not present

## 2023-08-18 DIAGNOSIS — I5032 Chronic diastolic (congestive) heart failure: Secondary | ICD-10-CM | POA: Diagnosis not present

## 2023-08-18 DIAGNOSIS — Z95818 Presence of other cardiac implants and grafts: Secondary | ICD-10-CM

## 2023-08-18 DIAGNOSIS — Z7902 Long term (current) use of antithrombotics/antiplatelets: Secondary | ICD-10-CM | POA: Insufficient documentation

## 2023-08-18 DIAGNOSIS — E1122 Type 2 diabetes mellitus with diabetic chronic kidney disease: Secondary | ICD-10-CM | POA: Insufficient documentation

## 2023-08-18 DIAGNOSIS — D500 Iron deficiency anemia secondary to blood loss (chronic): Secondary | ICD-10-CM | POA: Diagnosis not present

## 2023-08-18 HISTORY — PX: TRANSESOPHAGEAL ECHOCARDIOGRAM (CATH LAB): EP1270

## 2023-08-18 LAB — ECHO TEE

## 2023-08-18 SURGERY — TRANSESOPHAGEAL ECHOCARDIOGRAM (TEE) (CATHLAB)
Anesthesia: Monitor Anesthesia Care

## 2023-08-18 MED ORDER — PROPOFOL 500 MG/50ML IV EMUL
INTRAVENOUS | Status: DC | PRN
  Administered 2023-08-18: 100 ug/kg/min via INTRAVENOUS

## 2023-08-18 MED ORDER — SODIUM CHLORIDE 0.9% FLUSH: 10.0000 mL | Freq: Two times a day (BID) | INTRAVENOUS | Status: DC

## 2023-08-18 MED ORDER — LIDOCAINE 2% (20 MG/ML) 5 ML SYRINGE
INTRAMUSCULAR | Status: DC | PRN
  Administered 2023-08-18: 60 mg via INTRAVENOUS

## 2023-08-18 MED ORDER — SODIUM CHLORIDE 0.9 % IV SOLN: INTRAVENOUS | Status: DC | PRN

## 2023-08-18 MED ORDER — PROPOFOL 10 MG/ML IV BOLUS
INTRAVENOUS | Status: DC | PRN
  Administered 2023-08-18 (×2): 20 mg via INTRAVENOUS
  Administered 2023-08-18: 40 mg via INTRAVENOUS

## 2023-08-18 MED ORDER — METOPROLOL SUCCINATE ER 25 MG PO TB24: 12.5000 mg | ORAL_TABLET | Freq: Every day | ORAL | 1 refills | Status: DC

## 2023-08-18 NOTE — Discharge Instructions (Signed)
TEE   YOU HAD AN CARDIAC PROCEDURE TODAY: Refer to the procedure report and other information in the discharge instructions given to you for any specific questions about what was found during the examination. If this information does not answer your questions, please call Dr. Verl Dicker office at [phone] to clarify.   DIET: Your first meal following the procedure should be a light meal and then it is ok to progress to your normal diet. A half-sandwich or bowl of soup is an example of a good first meal. Heavy or fried foods are harder to digest and may make you feel nauseous or bloated. Drink plenty of fluids but you should avoid alcoholic beverages for 24 hours. If you had a esophageal dilation, please see attached instructions for diet.   ACTIVITY: Your care partner should take you home directly after the procedure. You should plan to take it easy, moving slowly for the rest of the day. You can resume normal activity the day after the procedure however YOU SHOULD NOT DRIVE, use power tools, machinery or perform tasks that involve climbing or major physical exertion for 24 hours (because of the sedation medicines used during the test).   SYMPTOMS TO REPORT IMMEDIATELY: A cardiologist can be reached at any hour. Please call [phone] for any of the following symptoms:  Vomiting of blood or coffee ground material  New, significant abdominal pain  New, significant chest pain or pain under the shoulder blades  Painful or persistently difficult swallowing  New shortness of breath  Black, tarry-looking or red, bloody stools  FOLLOW UP:  Please also call with any specific questions about appointments or follow up tests.

## 2023-08-18 NOTE — Op Note (Addendum)
INDICATIONS: Watchman device evaluation  PROCEDURE:   Informed consent was obtained prior to the procedure. The risks, benefits and alternatives for the procedure were discussed and the patient comprehended these risks.  Risks include, but are not limited to, cough, sore throat, vomiting, nausea, somnolence, esophageal and stomach trauma or perforation, bleeding, low blood pressure, aspiration, pneumonia, infection, trauma to the teeth and death.    After a procedural time-out, the oropharynx was anesthetized with 20% benzocaine spray.   During this procedure the patient was administered IV propofol by Anesthesiology, Dr. Elwyn Reach.  The transesophageal probe was inserted in the esophagus and stomach without difficulty and multiple views were obtained.  The patient was kept under observation until the patient left the procedure room.  The patient left the procedure room in stable condition.   Agitated microbubble saline contrast was not administered.  COMPLICATIONS:    There were no immediate complications.  FINDINGS:  Well seated Watchman device without leak. Mild-moderate MR. In atrial fibrillation with RVR at the time of the procedure. Consider rate control meds.  RECOMMENDATIONS:     OK to discontinue anticoagulants for atrial fibrillation.   Time Spent Directly with the Patient:  45 minutes   [name] [name] 08/18/2023, 10:07 AM

## 2023-08-18 NOTE — Telephone Encounter (Signed)
  [facility]ENTER   MULTIDISCIPLINARY HEART VALVE TEAM  Pt seen for post watchman TEE today. Watchman placement looks good. She was noted to be in afib with elevated rates ~110 bpm. She is not on any AV nodal blocking agents. Per review of old ECGs and last office note, when she is in sinus her HRs go into the 40s (ECG 11/11). Others show afib with slow VR. I will cautiously start her on Toprol XL 12.5mg  and bring her into the office next week for close follow up. Apt made on 1/27 @ 8:15am.   Carlean Jews PA-C

## 2023-08-18 NOTE — Transfer of Care (Signed)
Immediate Anesthesia Transfer of Care Note  Patient: [name] [name]  Procedure(s) Performed: TRANSESOPHAGEAL ECHOCARDIOGRAM  Patient Location: PACU and Cath Lab  Anesthesia Type:MAC  Level of Consciousness: awake and alert   Airway & Oxygen Therapy: Patient Spontanous Breathing and Patient connected to nasal cannula oxygen  Post-op Assessment: Report given to RN and Post -op Vital signs reviewed and stable  Post vital signs: Reviewed and stable  Last Vitals:  Vitals Value Taken Time  BP 110/72 (84) 08/18/23 1008  Temp    Pulse 111 08/18/23 1007  Resp 21 08/18/23 1007  SpO2 96 % 08/18/23 1007  Vitals shown include unfiled device data.  Last Pain:  Vitals:   08/18/23 0844  TempSrc:   PainSc: 0-No pain         Complications: There were no known notable events for this encounter.

## 2023-08-18 NOTE — Interval H&P Note (Signed)
History and Physical Interval Note:  08/18/2023 8:20 AM  [name] [name]  has presented today for surgery, with the diagnosis of POST WATCHMAN PLACEMENT.  The various methods of treatment have been discussed with the patient and family. After consideration of risks, benefits and other options for treatment, the patient has consented to  Procedure(s): TRANSESOPHAGEAL ECHOCARDIOGRAM (N/A) as a surgical intervention.  The patient's history has been reviewed, patient examined, no change in status, stable for surgery.  I have reviewed the patient's chart and labs.  Questions were answered to the patient's satisfaction.     Brantleigh Mifflin

## 2023-08-18 NOTE — Anesthesia Postprocedure Evaluation (Signed)
Anesthesia Post Note  Patient: [name] [name]  Procedure(s) Performed: TRANSESOPHAGEAL ECHOCARDIOGRAM     Patient location during evaluation: PACU Anesthesia Type: MAC Level of consciousness: awake and alert Pain management: pain level controlled Vital Signs Assessment: post-procedure vital signs reviewed and stable Respiratory status: spontaneous breathing, nonlabored ventilation, respiratory function stable and patient connected to nasal cannula oxygen Cardiovascular status: stable and blood pressure returned to baseline Postop Assessment: no apparent nausea or vomiting Anesthetic complications: no   There were no known notable events for this encounter.  Last Vitals:  Vitals:   08/18/23 1020 08/18/23 1030  BP: 110/60 111/67  Pulse: 96 (!) 103  Resp: (!) 21 17  Temp:    SpO2: (!) 89% 92%    Last Pain:  Vitals:   08/18/23 1008  TempSrc: Temporal  PainSc: 0-No pain                 Earl Lites P Robbie Nangle

## 2023-08-18 NOTE — Anesthesia Preprocedure Evaluation (Signed)
Anesthesia Evaluation  Patient identified by MRN, date of birth, ID band Patient awake    Reviewed: Allergy & Precautions, NPO status , Patient's Chart, lab work & pertinent test results  Airway Mallampati: II  TM Distance: >3 FB Neck ROM: Full    Dental no notable dental hx.    Pulmonary PE   Pulmonary exam normal        Cardiovascular hypertension, Pt. on medications +CHF and + DVT  + dysrhythmias  Rhythm:Regular Rate:Normal     Neuro/Psych  Headaches  negative psych ROS   GI/Hepatic Neg liver ROS,GERD  Medicated,,  Endo/Other  Hypothyroidism    Renal/GU   negative genitourinary   Musculoskeletal  (+) Arthritis , Osteoarthritis,    Abdominal Normal abdominal exam  (+)   Peds  Hematology  (+) Blood dyscrasia, anemia Lab Results      Component                Value               Date                      WBC                      5.3                 07/28/2023                HGB                      11.6                07/28/2023                HCT                      36.7                07/28/2023                MCV                      98 (H)              07/28/2023                PLT                      210                 07/28/2023              Anesthesia Other Findings   Reproductive/Obstetrics                             Anesthesia Physical Anesthesia Plan  ASA: 3  Anesthesia Plan: MAC   Post-op Pain Management:    Induction:   PONV Risk Score and Plan: 2 and Propofol infusion and Treatment may vary due to age or medical condition  Airway Management Planned: Simple Face Mask and Nasal Cannula  Additional Equipment: None  Intra-op Plan:   Post-operative Plan:   Informed Consent: I have reviewed the patients History and Physical, chart, labs and discussed the procedure including the risks, benefits and alternatives for the proposed anesthesia with the patient or  authorized representative who has indicated his/her understanding and acceptance.     Dental advisory given  Plan Discussed with: CRNA  Anesthesia Plan Comments:        Anesthesia Quick Evaluation

## 2023-08-20 ENCOUNTER — Encounter: Payer: Self-pay | Admitting: Cardiology

## 2023-08-21 ENCOUNTER — Ambulatory Visit: Payer: PPO | Attending: Cardiology | Admitting: Cardiology

## 2023-08-21 ENCOUNTER — Ambulatory Visit (INDEPENDENT_AMBULATORY_CARE_PROVIDER_SITE_OTHER): Payer: PPO

## 2023-08-21 ENCOUNTER — Encounter (HOSPITAL_COMMUNITY): Payer: Self-pay | Admitting: Cardiovascular Disease

## 2023-08-21 VITALS — BP 120/62 | HR 74 | Ht 63.0 in | Wt 201.2 lb

## 2023-08-21 DIAGNOSIS — I480 Paroxysmal atrial fibrillation: Secondary | ICD-10-CM

## 2023-08-21 DIAGNOSIS — Z8719 Personal history of other diseases of the digestive system: Secondary | ICD-10-CM | POA: Diagnosis not present

## 2023-08-21 DIAGNOSIS — Z95818 Presence of other cardiac implants and grafts: Secondary | ICD-10-CM | POA: Diagnosis not present

## 2023-08-21 DIAGNOSIS — I10 Essential (primary) hypertension: Secondary | ICD-10-CM | POA: Diagnosis not present

## 2023-08-21 DIAGNOSIS — I482 Chronic atrial fibrillation, unspecified: Secondary | ICD-10-CM | POA: Diagnosis not present

## 2023-08-21 NOTE — Progress Notes (Unsigned)
Enrolled for [employer] to mail a ZIO XT long term holter monitor to the patients address on file.  ? ?Dr. Antoine Poche to read. ?

## 2023-08-21 NOTE — Patient Instructions (Signed)
Medication Instructions:  Your physician recommends that you continue on your current medications as directed. Please refer to the Current Medication list given to you today.  *If you need a refill on your cardiac medications before your next appointment, please call your pharmacy*   Lab Work: None ordered  If you have labs (blood work) drawn today and your tests are completely normal, you will receive your results only by: MyChart Message (if you have MyChart) OR A paper copy in the mail If you have any lab test that is abnormal or we need to change your treatment, we will call you to review the results.   Testing/Procedures: [name] [name]- Long Term Monitor Instructions  Your physician has requested you wear a ZIO patch monitor for 14 days.  This is a single patch monitor. [employer] supplies one patch monitor per enrollment. Additional stickers are not available. Please do not apply patch if you will be having a Nuclear Stress Test,  Echocardiogram, Cardiac CT, MRI, or Chest Xray during the period you would be wearing the  monitor. The patch cannot be worn during these tests. You cannot remove and re-apply the  ZIO XT patch monitor.  Your ZIO patch monitor will be mailed 3 day USPS to your address on file. It may take 3-5 days  to receive your monitor after you have been enrolled.  Once you have received your monitor, please review the enclosed instructions. Your monitor  has already been registered assigning a specific monitor serial # to you.  Billing and Patient Assistance Program Information  We have supplied [employer] with any of your insurance information on file for billing purposes. [employer] offers a sliding scale Patient Assistance Program for patients that do not have  insurance, or whose insurance does not completely cover the cost of the ZIO monitor.  You must apply for the Patient Assistance Program to qualify for this discounted rate.  To apply, please call [employer] at  [phone], select option 4, select option 2, ask to apply for  Patient Assistance Program. Meredeth Ide will ask your household income, and how many people  are in your household. They will quote your out-of-pocket cost based on that information.  [employer] will also be able to set up a 48-month, interest-free payment plan if needed.  Applying the monitor   Shave hair from upper left chest.  Hold abrader disc by orange tab. Rub abrader in 40 strokes over the upper left chest as  indicated in your monitor instructions.  Clean area with 4 enclosed alcohol pads. Let dry.  Apply patch as indicated in monitor instructions. Patch will be placed under collarbone on left  side of chest with arrow pointing upward.  Rub patch adhesive wings for 2 minutes. Remove white label marked "1". Remove the white  label marked "2". Rub patch adhesive wings for 2 additional minutes.  While looking in a mirror, press and release button in center of patch. A small green light will  flash 3-4 times. This will be your only indicator that the monitor has been turned on.  Do not shower for the first 24 hours. You may shower after the first 24 hours.  Press the button if you feel a symptom. You will hear a small click. Record Date, Time and  Symptom in the Patient Logbook.  When you are ready to remove the patch, follow instructions on the last 2 pages of Patient  Logbook. Stick patch monitor onto the last page of Patient Logbook.  Place Patient  Logbook in the blue and white box. Use locking tab on box and tape box closed  securely. The blue and white box has prepaid postage on it. Please place it in the mailbox as  soon as possible. Your physician should have your test results approximately 7 days after the  monitor has been mailed back to [facility].  Call [facility] Customer Care at [phone] if you have questions regarding  your ZIO XT patch monitor. Call them immediately if you see an orange light  blinking on your  monitor.  If your monitor falls off in less than 4 days, contact our Monitor department at [phone].  If your monitor becomes loose or falls off after 4 days call [employer] at [phone] for  suggestions on securing your monitor    Follow-Up: At [facility], you and your health needs are our priority.  As part of our continuing mission to provide you with exceptional heart care, we have created designated Provider Care Teams.  These Care Teams include your primary Cardiologist (physician) and Advanced Practice Providers (APPs -  Physician Assistants and Nurse Practitioners) who all work together to provide you with the care you need, when you need it.  We recommend signing up for the patient portal called "MyChart".  Sign up information is provided on this After Visit Summary.  MyChart is used to connect with patients for Virtual Visits (Telemedicine).  Patients are able to view lab/test results, encounter notes, upcoming appointments, etc.  Non-urgent messages can be sent to your provider as well.   To learn more about what you can do with MyChart, go to [email or web address].    Your next appointment:   Will call with an appointment   Provider:   Georgie Chard, NP or Carlean Jews, PA-C    Other Instructions   1st Floor: - Lobby - Registration  - Pharmacy  - Lab - Cafe  2nd Floor: - PV Lab - Diagnostic Testing (echo, CT, nuclear med)  3rd Floor: - Vacant  4th Floor: - TCTS (cardiothoracic surgery) - AFib Clinic - Structural Heart Clinic - Vascular Surgery  - Vascular Ultrasound  5th Floor: - HeartCare Cardiology (general and EP) - Clinical Pharmacy for coumadin, hypertension, lipid, weight-loss medications, and med management appointments    Valet parking services will be available as well.

## 2023-08-21 NOTE — Progress Notes (Signed)
[facility]                                     Cardiology Office Note:    Date:  08/21/2023   ID:  [name] [name], [name] [name] 04/21/1941, MRN [id number]  PCP:  Gabriel Earing, FNP  [facility] Cardiologist:  Rollene Rotunda, MD  [facility] Electrophysiologist:  None   Referring MD: Gabriel Earing, FNP   Chief Complaint  Patient presents with   Follow-up   History of Present Illness:    [name] [name] is a 83 y.o. female with a hx of HFimpEF, mitral valve disease, HTN, CKD stage IIIa, iron deficiency anemia, GI bleeding, aortic atherosclerosis (noted on 12/2020 CT) and persistent atrial fibrillation s/p LAAO with Watchman (06/15/23) who presents for follow up after beta blocker initiation   [name] [name] was felt to be a poor long term candidate for oral anticoagulation due to iron deficiency anemia and GI bleeding. She underwent left atrial appendage occlusion with a 24mm Watchman FLX Pro device on 06/15/23 by Dr. Lalla Brothers. She was continued on Eliquis 2.5mg  BID however this was stopped 45 days after implant and Plavix was initiated. She underwent TEE 08/18/23 which showed excellent device position with no leak or thrombus. Patients HR was noted to be elevated but also review of prior EKGs with HRs in the 40-50's. Beta blocker cautiously added with plan for follow up.   Today she is here and reports she is feeling great with no palpitations, chest pain, dizziness, SOB, LE edema, orthopnea, or syncope. EKG today with moderately rate controlled AF.   Past Medical History:  Diagnosis Date   Anemia    Arthritis    Ostearthritis- hips, knees, fingers   Atrial fibrillation (HCC)    Basal cell carcinoma (BCC) of right hand 10/2021   Dx by [facility]   DVT (deep venous thrombosis) (HCC)    Dyspnea    Dysrhythmia    GERD (gastroesophageal reflux disease)    Headache(784.0)    tx. Valproic acid   History of diabetes mellitus    History of kidney stones     Hypertension    Hypothyroidism    Presence of Watchman left atrial appendage closure device 06/15/2023   24mm Watchman FLX Pro device placed by Dr. Lalla Brothers   Pulmonary embolism [facility])     Past Surgical History:  Procedure Laterality Date   ABDOMINAL HYSTERECTOMY     BILATERAL HIP ARTHROSCOPY Left    BIOPSY  12/08/2019   Procedure: BIOPSY;  Surgeon: Corbin Ade, MD;  [facility]: [facility];  Service: Endoscopy;;   CATARACT EXTRACTION, BILATERAL     COLONOSCOPY N/A 12/08/2019   polyps (tubular adenoma), diverticulosis, colonic lipoma, no surveillance due to age   CYSTOSCOPY W/ URETERAL STENT PLACEMENT Right 12/25/2020   Procedure: CYSTOSCOPY WITH RETROGRADE PYELOGRAM/URETERAL STENT PLACEMENT;  Surgeon: Jannifer Hick, MD;  Location: [facility] OR[facility];  Service: Urology;  Laterality: Right;   CYSTOSCOPY/URETEROSCOPY/HOLMIUM LASER/STENT PLACEMENT Right 01/15/2021   Procedure: CYSTOSCOPY/RETROGRADE/URETEROSCOPY/HOLMIUM LASER/STENT EXCHANGE;  Surgeon: Jannifer Hick, MD;  Location: [facility];  Service: Urology;  Laterality: Right;   ESOPHAGOGASTRODUODENOSCOPY N/A 12/08/2019   normal esophagus with 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   ESOPHAGOGASTRODUODENOSCOPY (EGD) WITH PROPOFOL N/A 05/18/2021   Surgeon: Vida Rigger, MD;   Tiny hiatal hernia, gastric antral vascular ectasia s/p ablation, normal duodenum, normal jejunum.  ESOPHAGOGASTRODUODENOSCOPY (EGD) WITH PROPOFOL N/A 03/10/2022   Procedure: ESOPHAGOGASTRODUODENOSCOPY (EGD) WITH PROPOFOL;  Surgeon: Lanelle Bal, DO;  [facility];  Service: Endoscopy;  Laterality: N/A;  10:45am   ESOPHAGOGASTRODUODENOSCOPY (EGD) WITH PROPOFOL N/A 02/01/2023   Procedure: ESOPHAGOGASTRODUODENOSCOPY (EGD) WITH PROPOFOL;  Surgeon: Lanelle Bal, DO;  [facility];  Service: Endoscopy;  Laterality: N/A;  9:00 am, asa 3   GIVENS CAPSULE STUDY N/A 01/13/2020   Procedure: GIVENS CAPSULE STUDY;  Surgeon:  Corbin Ade, MD;  [facility];  Service: Endoscopy;  Laterality: N/A;  7:30am   GIVENS CAPSULE STUDY N/A 05/04/2021   Procedure: GIVENS CAPSULE STUDY;  Surgeon: Vida Rigger, MD;  [facility];  Service: Endoscopy;  Laterality: N/A;   LEFT ATRIAL APPENDAGE OCCLUSION N/A 06/15/2023   Procedure: LEFT ATRIAL APPENDAGE OCCLUSION;  Surgeon: Lanier Prude, MD;  [facility]: [facility];  Service: Cardiovascular;  Laterality: N/A;   MULTIPLE TOOTH EXTRACTIONS     60's   PARATHYROIDECTOMY     POLYPECTOMY  12/08/2019   Procedure: POLYPECTOMY;  Surgeon: Corbin Ade, MD;  [facility];  Service: Endoscopy;;   POLYPECTOMY  02/01/2023   Procedure: POLYPECTOMY;  Surgeon: Lanelle Bal, DO;  [facility];  Service: Endoscopy;;   RADIOFREQUENCY ABLATION  05/18/2021   Procedure: RADIO FREQUENCY ABLATION;  Surgeon: Vida Rigger, MD;  [facility];  Service: Endoscopy;;   TEE WITHOUT CARDIOVERSION N/A 07/06/2021   Procedure: TRANSESOPHAGEAL ECHOCARDIOGRAM (TEE);  Surgeon: Little Ishikawa, MD;  Location: [facility] ENDOSCOPY;  Service: Cardiovascular;  Laterality: N/A;   TEE WITHOUT CARDIOVERSION N/A 06/15/2023   Procedure: TRANSESOPHAGEAL ECHOCARDIOGRAM;  Surgeon: Lanier Prude, MD;  Location: [facility] CV LAB;  Service: Cardiovascular;  Laterality: N/A;   TOTAL HIP ARTHROPLASTY Right 10/29/2013   Procedure: RIGHT TOTAL HIP ARTHROPLASTY ANTERIOR APPROACH;  Surgeon: Shelda Pal, MD;  Location: [facility];  Service: Orthopedics;  Laterality: Right;   TRANSESOPHAGEAL ECHOCARDIOGRAM (CATH LAB) N/A 08/18/2023   Procedure: TRANSESOPHAGEAL ECHOCARDIOGRAM;  Surgeon: Thurmon Fair, MD;  [facility];  Service: Cardiovascular;  Laterality: N/A;    Current Medications: Current Meds  Medication Sig   acetaminophen (TYLENOL) 325 MG tablet Take 2 tablets (650 mg total) by mouth every 6 (six) hours as needed for mild pain or  headache (or Fever >/= 101).   allopurinol (ZYLOPRIM) 100 MG tablet Take 1 tablet (100 mg total) by mouth daily.   amLODipine (NORVASC) 5 MG tablet Take 1 tablet (5 mg total) by mouth daily.   atorvastatin (LIPITOR) 20 MG tablet Take 1 tablet by mouth once daily   cetirizine (ZYRTEC) 10 MG tablet Take 10 mg by mouth daily.   clopidogrel (PLAVIX) 75 MG tablet Take 1 tablet (75 mg total) by mouth daily.   cyanocobalamin (VITAMIN B12) 1000 MCG tablet Take 1,000 mcg by mouth daily.   dapagliflozin propanediol (FARXIGA) 10 MG TABS tablet Take 1 tablet (10 mg total) by mouth daily.   ferrous sulfate 325 (65 FE) MG EC tablet Take 325 mg by mouth daily.   furosemide (LASIX) 20 MG tablet Take 1 tablet by mouth once daily   GNP CRANBERRY EXTRACT PO Take 1 tablet by mouth in the morning.   levothyroxine (SYNTHROID) 50 MCG tablet TAKE 1 TABLET BY MOUTH ONCE DAILY BEFORE BREAKFAST   losartan (COZAAR) 25 MG tablet Take 1 tablet (25 mg total) by mouth daily.   metoprolol succinate (TOPROL XL) 25  MG 24 hr tablet Take 0.5 tablets (12.5 mg total) by mouth daily.   oxybutynin (DITROPAN-XL) 10 MG 24 hr tablet Take 1 tablet (10 mg total) by mouth at bedtime.   pantoprazole (PROTONIX) 40 MG tablet Take 1 tablet (40 mg total) by mouth 2 (two) times daily before a meal.     Allergies:   Patient has no known allergies.   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 She has four adult children and one son lives with her.       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   Social Drivers of Corporate investment banker Strain: Low Risk  (12/27/2022)   Overall Financial Resource Strain (CARDIA)    Difficulty of Paying Living Expenses: Not hard at all  Food Insecurity: No Food Insecurity (06/15/2023)   Hunger Vital Sign    Worried About Running Out of Food in the Last Year: Never true    Ran Out of Food in the Last Year: Never true  Transportation Needs: No Transportation Needs (06/15/2023)   PRAPARE - Administrator, Civil Service (Medical): No    Lack of Transportation (Non-Medical): No  Physical Activity: Insufficiently Active (12/27/2022)   Exercise Vital Sign    Days of Exercise per Week: 3 days    Minutes of Exercise per Session: 30 min  Stress: No Stress Concern Present (12/27/2022)   [employer] of Occupational Health - Occupational Stress Questionnaire    Feeling of Stress : Not at all  Social Connections: Moderately Isolated (12/27/2022)   Social Connection and Isolation Panel [NHANES]    Frequency of Communication with Friends and Family: More than three times a week    Frequency of Social Gatherings with Friends and Family: More than three times a week    Attends Religious Services: More than 4 times per year    Active Member of [employer] or Organizations: No    Attends Banker Meetings: Never    Marital Status: Widowed     Family History: The patient's family history includes Cancer (age of onset: 66) in her sister; Cancer (age of onset: 72) in her mother; Colitis (age of onset: 59) in her sister; Healthy in her daughter, son, son, and son; Heart attack (age of onset: 46) in her brother; Heart disease in her brother; Heart disease (age of onset: 39) in her father; Pulmonary embolism (age of onset: 12) in her brother.  ROS:   Please see the history of present illness.    All other systems reviewed  and are negative.  EKGs/Labs/Other Studies Reviewed:    The following studies were reviewed today:   Cardiac Studies & Procedures      ECHOCARDIOGRAM  ECHOCARDIOGRAM COMPLETE 03/16/2023  Narrative ECHOCARDIOGRAM REPORT  Patient Name:   JOZALYNN NOYCE [facility] Date of Exam: 03/16/2023 Medical Rec #:  [id number]           Height:       63.0 in Accession #:    [id number]          Weight:       201.0 lb Date of Birth:  [birth year]           BSA:          1.938 m Patient Age:    82 years            BP:           143/74 mmHg Patient Gender: F                   HR:           41 bpm. Exam Location:  Outpatient  Procedure: 2D Echo, 3D Echo, Cardiac Doppler, Color Doppler and Strain Analysis  Indications:    I48.91* Unspeicified atrial fibrillation  History:        Patient has prior history of Echocardiogram examinations, most recent 03/29/2022. CHF, Abnormal ECG, Arrythmias:Bradycardia; Risk Factors:Hypertension and Dyslipidemia.  Sonographer:    Sheralyn Boatman RDCS Referring Phys: [id number] JAMES HOCHREIN  IMPRESSIONS   1. Left ventricular ejection fraction, by estimation, is 60 to 65%. The left ventricle has normal function. The left ventricle has no regional wall motion abnormalities. Left ventricular diastolic parameters are indeterminate. The average left ventricular global longitudinal strain is -18.6 %. The global longitudinal strain is normal. 2. Right ventricular systolic function is normal. The right ventricular size is moderately enlarged. 3. Left atrial size was severely dilated. 4. Right atrial size was severely dilated. 5. The mitral valve is degenerative. Mild to moderate mitral valve regurgitation. No evidence of mitral stenosis. There is mild prolapse of of the mitral valve. 6. The aortic valve is tricuspid. There is mild calcification of the aortic valve. Aortic valve regurgitation is not visualized. Aortic valve sclerosis is present, with no evidence of aortic valve  stenosis. 7. The inferior vena cava is normal in size with greater than 50% respiratory variability, suggesting right atrial pressure of 3 mmHg.  Comparison(s): LV is more vigorous both visually and with strain. MR has increased from prior.  FINDINGS Left Ventricle: False tendon present in LV apex. Left ventricular ejection fraction, by estimation, is 60 to 65%. The left ventricle has normal function. The left ventricle has no regional wall motion abnormalities. The average left ventricular global longitudinal strain is -18.6 %. The global longitudinal strain is normal. The left ventricular internal cavity size was normal in size. There is no left ventricular hypertrophy. Left ventricular diastolic parameters are indeterminate.  Right Ventricle: The right ventricular size is moderately enlarged. No increase in right ventricular wall thickness. Right ventricular systolic function is normal.  Left Atrium: Left atrial size was severely dilated.  Right Atrium: Right atrial size was severely dilated.  Pericardium: There is no evidence of pericardial effusion.  Mitral Valve: The mitral valve is degenerative in appearance. There is mild prolapse of of the mitral valve. Mild to moderate mitral annular calcification. Mild to moderate mitral valve regurgitation. No evidence of mitral valve stenosis.  Tricuspid Valve: The tricuspid valve is normal in structure. Tricuspid valve regurgitation is trivial. No evidence of tricuspid stenosis.  Aortic Valve: The aortic valve is tricuspid. There is mild calcification of the aortic valve. Aortic valve regurgitation is not visualized. Aortic valve sclerosis is  present, with no evidence of aortic valve stenosis.  Pulmonic Valve: The pulmonic valve was normal in structure. Pulmonic valve regurgitation is trivial. No evidence of pulmonic stenosis.  Aorta: The aortic root and ascending aorta are structurally normal, with no evidence of dilitation.  Venous: The  inferior vena cava is normal in size with greater than 50% respiratory variability, suggesting right atrial pressure of 3 mmHg.  IAS/Shunts: The interatrial septum appears to be lipomatous. No atrial level shunt detected by color flow Doppler.   LEFT VENTRICLE PLAX 2D LVIDd:         5.50 cm      Diastology LVIDs:         3.65 cm      LV e' medial:    5.44 cm/s LV PW:         1.10 cm      LV E/e' medial:  14.8 LV IVS:        1.00 cm      LV e' lateral:   6.20 cm/s LVOT diam:     2.40 cm      LV E/e' lateral: 13.0 LV SV:         120 LV SV Index:   62           2D Longitudinal Strain LVOT Area:     4.52 cm     2D Strain GLS Avg:     -18.6 %  LV Volumes (MOD) LV vol d, MOD A2C: 125.0 ml 3D Volume EF: LV vol d, MOD A4C: 120.0 ml 3D EF:        59 % LV vol s, MOD A2C: 47.5 ml  LV EDV:       164 ml LV vol s, MOD A4C: 48.6 ml  LV ESV:       66 ml LV SV MOD A2C:     77.5 ml  LV SV:        97 ml LV SV MOD A4C:     120.0 ml LV SV MOD BP:      76.4 ml  RIGHT VENTRICLE             IVC RV S prime:     11.10 cm/s  IVC diam: 1.90 cm TAPSE (M-mode): 2.3 cm  LEFT ATRIUM              Index        RIGHT ATRIUM           Index LA diam:        4.50 cm  2.32 cm/m   RA Area:     20.20 cm LA Vol (A2C):   112.0 ml 57.80 ml/m  RA Volume:   50.20 ml  25.91 ml/m LA Vol (A4C):   79.1 ml  40.82 ml/m LA Biplane Vol: 95.8 ml  49.44 ml/m AORTIC VALVE             PULMONIC VALVE LVOT Vmax:   107.00 cm/s PR End Diast Vel: 1.15 msec LVOT Vmean:  65.100 cm/s LVOT VTI:    0.266 m  AORTA Ao Root diam: 3.50 cm Ao Asc diam:  3.60 cm  MITRAL VALVE                  TRICUSPID VALVE MV Area (PHT): 3.12 cm       TR Peak grad:   26.0 mmHg MV Decel Time: 243 msec       TR Vmax:  255.00 cm/s MR Peak grad:    95.3 mmHg MR Mean grad:    58.0 mmHg    SHUNTS MR Vmax:         488.00 cm/s  Systemic VTI:  0.27 m MR Vmean:        353.0 cm/s   Systemic Diam: 2.40 cm MR PISA:         2.26 cm MR PISA Eff ROA: 18  mm MR PISA Radius:  0.60 cm MV E velocity: 80.60 cm/s MV A velocity: 60.80 cm/s MV E/A ratio:  1.33  Riley Lam MD Electronically signed by Riley Lam MD Signature Date/Time: 03/16/2023/5:43:06 PM    Final  TEE  ECHO TEE 08/18/2023  Narrative TRANSESOPHOGEAL ECHO REPORT    Patient Name:   ADDY MCMANNIS Date of Exam: 08/18/2023 Medical Rec #:  [id number]           Height:       63.0 in Accession #:    [id number]          Weight:       202.0 lb Date of Birth:  07-05-1941           BSA:          1.942 m Patient Age:    83 years            BP:           137/93 mmHg Patient Gender: F                   HR:           112 bpm. Exam Location:  Inpatient  Procedure: Cardiac Doppler, Color Doppler, Intracardiac Opacification Agent, Transesophageal Echo and 3D Echo  Indications:     Watchman Eval  History:         Patient has prior history of Echocardiogram examinations, most recent 02/07/2021.  Sonographer:     Harriette Bouillon RDCS Referring Phys:  [id number] Lanier Prude Diagnosing Phys: Thurmon Fair MD  PROCEDURE: The transesophogeal probe was passed without difficulty through the esophogus of the patient. Sedation performed by different physician. The patient's vital signs; including heart rate, blood pressure, and oxygen saturation; remained stable throughout the procedure. The patient developed no complications during the procedure.  IMPRESSIONS   1. Left ventricular ejection fraction, by estimation, is 50 to 55%. The left ventricle has low normal function. The left ventricle has no regional wall motion abnormalities. 2. Right ventricular systolic function is normal. The right ventricular size is normal. There is normal pulmonary artery systolic pressure. The estimated right ventricular systolic pressure is 22.4 mmHg. 3. There is a well seated Watchman device in the left atrial appendage with excellent apposition and no evidence of leak. Left atrial  size was moderately dilated. No left atrial/left atrial appendage thrombus was detected. 4. The mitral valve is normal in structure. No evidence of mitral valve regurgitation. No evidence of mitral stenosis. 5. The aortic valve is normal in structure. Aortic valve regurgitation is not visualized. No aortic stenosis is present. 6. The inferior vena cava is normal in size with greater than 50% respiratory variability, suggesting right atrial pressure of 3 mmHg. 7. Rhythm strip during this exam demonstrates atrial fibrillation. There is rapid ventricular response (max 151 bpm, average 115 bpm).  FINDINGS Left Ventricle: Left ventricular ejection fraction, by estimation, is 50 to 55%. The left ventricle has low normal function. The left ventricle has no regional wall motion abnormalities. The left  ventricular internal cavity size was normal in size. There is no left ventricular hypertrophy.  Right Ventricle: The right ventricular size is normal. No increase in right ventricular wall thickness. Right ventricular systolic function is normal. There is normal pulmonary artery systolic pressure. The tricuspid regurgitant velocity is 2.20 m/s, and with an assumed right atrial pressure of 3 mmHg, the estimated right ventricular systolic pressure is 22.4 mmHg.  Left Atrium: There is a well seated Watchman device in the left atrial appendage with excellent apposition and no evidence of leak. Left atrial size was moderately dilated. No left atrial/left atrial appendage thrombus was detected.  Right Atrium: Right atrial size was normal in size.  Pericardium: There is no evidence of pericardial effusion.  Mitral Valve: The mitral valve is normal in structure. No evidence of mitral valve regurgitation. No evidence of mitral valve stenosis.  Tricuspid Valve: The tricuspid valve is normal in structure. Tricuspid valve regurgitation is mild . No evidence of tricuspid stenosis.  Aortic Valve: The aortic valve is  normal in structure. Aortic valve regurgitation is not visualized. No aortic stenosis is present.  Pulmonic Valve: The pulmonic valve was normal in structure. Pulmonic valve regurgitation is trivial. No evidence of pulmonic stenosis.  Aorta: The aortic root is normal in size and structure.  Venous: The inferior vena cava is normal in size with greater than 50% respiratory variability, suggesting right atrial pressure of 3 mmHg.  IAS/Shunts: No atrial level shunt detected by color flow Doppler.  EKG: Rhythm strip during this exam demonstrates atrial fibrillation.   TRICUSPID VALVE TR Peak grad:   19.4 mmHg TR Vmax:        220.00 cm/s  Rachelle Hora Croitoru MD Electronically signed by Thurmon Fair MD Signature Date/Time: 08/18/2023/2:11:19 PM    Final  MONITORS  LONG TERM MONITOR (3-14 DAYS) 02/21/2020  Narrative NSR Occasional supraventricular tachycardia with the longest run being 11 beats. Occasional isolated PVCs No sustained arrhythmias.           EKG:  EKG is ordered today.  The ekg ordered today demonstrates AF with HR 96bpm  Recent Labs: 04/20/2023: ALT 11; TSH 2.650 07/28/2023: BUN 24; Creatinine, Ser 0.99; Hemoglobin 11.6; Platelets 210; Potassium 4.9; Sodium 142   Recent Lipid Panel    Component Value Date/Time   CHOL 154 04/20/2023 1028   TRIG 182 (H) 04/20/2023 1028   HDL 44 04/20/2023 1028   CHOLHDL 3.5 04/20/2023 1028   CHOLHDL 3.7 02/07/2021 0528   VLDL 13 02/07/2021 0528   LDLCALC 79 04/20/2023 1028    Risk Assessment/Calculations:    CHA2DS2-VASc Score = 6 [CHF History: 1, HTN History: 1, Diabetes History: 1, Stroke History: 0, Vascular Disease History: 0, Age Score: 2, Gender Score: 1].  Therefore, the patient's annual risk of stroke is 9.7 %.      Physical Exam:    VS:  BP 120/62   Pulse 74   Ht 5\' 3"  (1.6 m)   Wt 201 lb 3.2 oz (91.3 kg)   SpO2 98%   BMI 35.64 kg/m     Wt Readings from Last 3 Encounters:  08/21/23 201 lb 3.2 oz (91.3 kg)   08/18/23 202 lb (91.6 kg)  07/28/23 204 lb (92.5 kg)    General: Well developed, well nourished, NAD Lungs:Clear to ausculation bilaterally. No wheezes, rales, or rhonchi. Breathing is unlabored. Cardiovascular: Irregularly irregular. No murmurs Extremities: No edema.  Neuro: Alert and oriented. No focal deficits. No facial asymmetry. MAE spontaneously. Psych: Responds to questions  appropriately with normal affect.    ASSESSMENT/PLAN:    Persistent atrial fibirllation s/p LAAO with Watchman: Toprol 12.5mg  daily added to regimen last OV. EKG shows AF today with HR 96bpm. PLace 2-week ZIO today to follow for bradycardia. If monitor stable, plan to increase Toprol to 25mg  daily. TEE discussed today which showed no device leak or thrombus. Plan follow up depending on ZIO results otherwise continue with 6 month up.   Hx of GI bleeding: No reports of bleeding.    IDA: Hg 10.4 last month.    HTN: STable today. Continue current regimen.    HFimpEF: appears euvolemic. Continue Lasix 20mg  daily and Farixga 10mg  daily.    Aortic atherosclerosis: (noted on 12/2020 CT). Anticipate destination therapy with aspirin 81mg  daily once transitioned off Plavix.     I spent 15 minutes caring for this patient today including face-to-face discussions, ordering and reviewing labs, reviewing records from [facility] and other outside facilities, documenting in the record, and arranging for follow up.     Medication Adjustments/Labs and Tests Ordered: Current medicines are reviewed at length with the patient today.  Concerns regarding medicines are outlined above.  Orders Placed This Encounter  Procedures   LONG TERM MONITOR (3-14 DAYS)   EKG 12-Lead   No orders of the defined types were placed in this encounter.   There are no Patient Instructions on file for this visit.   Signed, Georgie Chard, NP  08/21/2023 8:43 AM    [facility]

## 2023-08-23 DIAGNOSIS — I480 Paroxysmal atrial fibrillation: Secondary | ICD-10-CM | POA: Diagnosis not present

## 2023-09-04 NOTE — Progress Notes (Signed)
 Pharmacy Medication Assistance Program Note    09/04/2023  Patient ID: [name] [name], female   DOB: Jul 16, 1941, 83 y.o.   MRN: [id number]     06/13/2023  Outreach Medication One  Initial Outreach Date (Medication One) 05/08/2023  Manufacturer Medication One [employer]  [employer] [facility] Farxiga   Dose of Farxiga  10MG   Type of Assistance Manufacturer Assistance  Date Application Sent to Patient 05/17/2023  Application Items Requested Application  Name of Prescriber Lugenia Said  Date Application Received From Patient 06/07/2023  Application Items Received From Patient Application  Date Application Submitted to Manufacturer 06/13/2023  Method Application Sent to Manufacturer Fax  Patient Assistance Determination Approved  Approval End Date 07/24/2024

## 2023-09-05 ENCOUNTER — Other Ambulatory Visit: Payer: Self-pay | Admitting: *Deleted

## 2023-09-05 MED ORDER — PANTOPRAZOLE SODIUM 40 MG PO TBEC: 40.0000 mg | DELAYED_RELEASE_TABLET | Freq: Two times a day (BID) | ORAL | 0 refills | Status: DC

## 2023-09-05 MED ORDER — OXYBUTYNIN CHLORIDE ER 10 MG PO TB24
10.0000 mg | ORAL_TABLET | Freq: Every day | ORAL | 0 refills | Status: DC
Start: 2023-09-05 — End: 2023-11-15

## 2023-09-05 NOTE — Addendum Note (Signed)
Addended by: Julious Payer D on: 09/05/2023 02:39 PM   Modules accepted: Orders

## 2023-09-05 NOTE — Telephone Encounter (Signed)
Tiffany NTBS in March for 6 mos CPE RF sent to pharmacy

## 2023-09-13 ENCOUNTER — Other Ambulatory Visit: Payer: Self-pay | Admitting: Cardiology

## 2023-09-13 DIAGNOSIS — I480 Paroxysmal atrial fibrillation: Secondary | ICD-10-CM | POA: Diagnosis not present

## 2023-09-22 ENCOUNTER — Telehealth: Payer: Self-pay

## 2023-09-22 DIAGNOSIS — N1831 Chronic kidney disease, stage 3a: Secondary | ICD-10-CM

## 2023-09-22 MED ORDER — DAPAGLIFLOZIN PROPANEDIOL 10 MG PO TABS: 10.0000 mg | ORAL_TABLET | Freq: Every day | ORAL | 5 refills | Status: DC

## 2023-09-22 NOTE — Telephone Encounter (Signed)
 Refills sent

## 2023-09-22 NOTE — Telephone Encounter (Signed)
 Rec's fax from [facility] requesting refills for patients Farxiga.    Please send refills to [facility] pharmacy.

## 2023-09-26 ENCOUNTER — Other Ambulatory Visit: Payer: Self-pay

## 2023-09-26 MED ORDER — METOPROLOL SUCCINATE ER 25 MG PO TB24: 25.0000 mg | ORAL_TABLET | Freq: Every day | ORAL | 3 refills | Status: DC

## 2023-09-28 ENCOUNTER — Other Ambulatory Visit: Payer: Self-pay

## 2023-09-28 DIAGNOSIS — D649 Anemia, unspecified: Secondary | ICD-10-CM

## 2023-09-28 DIAGNOSIS — D500 Iron deficiency anemia secondary to blood loss (chronic): Secondary | ICD-10-CM

## 2023-09-29 ENCOUNTER — Inpatient Hospital Stay: Payer: PPO | Attending: Physician Assistant

## 2023-09-29 DIAGNOSIS — I129 Hypertensive chronic kidney disease with stage 1 through stage 4 chronic kidney disease, or unspecified chronic kidney disease: Secondary | ICD-10-CM | POA: Insufficient documentation

## 2023-09-29 DIAGNOSIS — D500 Iron deficiency anemia secondary to blood loss (chronic): Secondary | ICD-10-CM | POA: Diagnosis not present

## 2023-09-29 DIAGNOSIS — Z800 Family history of malignant neoplasm of digestive organs: Secondary | ICD-10-CM | POA: Diagnosis not present

## 2023-09-29 DIAGNOSIS — E559 Vitamin D deficiency, unspecified: Secondary | ICD-10-CM | POA: Diagnosis not present

## 2023-09-29 DIAGNOSIS — Z79890 Hormone replacement therapy (postmenopausal): Secondary | ICD-10-CM | POA: Diagnosis not present

## 2023-09-29 DIAGNOSIS — K5900 Constipation, unspecified: Secondary | ICD-10-CM | POA: Diagnosis not present

## 2023-09-29 DIAGNOSIS — Z7902 Long term (current) use of antithrombotics/antiplatelets: Secondary | ICD-10-CM | POA: Diagnosis not present

## 2023-09-29 DIAGNOSIS — Z7901 Long term (current) use of anticoagulants: Secondary | ICD-10-CM | POA: Insufficient documentation

## 2023-09-29 DIAGNOSIS — E039 Hypothyroidism, unspecified: Secondary | ICD-10-CM | POA: Insufficient documentation

## 2023-09-29 DIAGNOSIS — Z79899 Other long term (current) drug therapy: Secondary | ICD-10-CM | POA: Diagnosis not present

## 2023-09-29 DIAGNOSIS — D649 Anemia, unspecified: Secondary | ICD-10-CM

## 2023-09-29 DIAGNOSIS — I4891 Unspecified atrial fibrillation: Secondary | ICD-10-CM | POA: Diagnosis not present

## 2023-09-29 DIAGNOSIS — Z87442 Personal history of urinary calculi: Secondary | ICD-10-CM | POA: Diagnosis not present

## 2023-09-29 DIAGNOSIS — Z86711 Personal history of pulmonary embolism: Secondary | ICD-10-CM | POA: Diagnosis not present

## 2023-09-29 DIAGNOSIS — K31811 Angiodysplasia of stomach and duodenum with bleeding: Secondary | ICD-10-CM | POA: Diagnosis not present

## 2023-09-29 DIAGNOSIS — E1122 Type 2 diabetes mellitus with diabetic chronic kidney disease: Secondary | ICD-10-CM | POA: Insufficient documentation

## 2023-09-29 DIAGNOSIS — I2699 Other pulmonary embolism without acute cor pulmonale: Secondary | ICD-10-CM | POA: Diagnosis not present

## 2023-09-29 DIAGNOSIS — Z85828 Personal history of other malignant neoplasm of skin: Secondary | ICD-10-CM | POA: Insufficient documentation

## 2023-09-29 DIAGNOSIS — R059 Cough, unspecified: Secondary | ICD-10-CM | POA: Diagnosis not present

## 2023-09-29 DIAGNOSIS — Z86718 Personal history of other venous thrombosis and embolism: Secondary | ICD-10-CM | POA: Insufficient documentation

## 2023-09-29 DIAGNOSIS — K5730 Diverticulosis of large intestine without perforation or abscess without bleeding: Secondary | ICD-10-CM | POA: Diagnosis not present

## 2023-09-29 LAB — CBC WITH DIFFERENTIAL/PLATELET
Abs Immature Granulocytes: 0.03 10*3/uL (ref 0.00–0.07)
Basophils Absolute: 0 10*3/uL (ref 0.0–0.1)
Basophils Relative: 1 %
Eosinophils Absolute: 0.1 10*3/uL (ref 0.0–0.5)
Eosinophils Relative: 2 %
HCT: 36.8 % (ref 36.0–46.0)
Hemoglobin: 11.2 g/dL — ABNORMAL LOW (ref 12.0–15.0)
Immature Granulocytes: 1 %
Lymphocytes Relative: 17 %
Lymphs Abs: 1.1 10*3/uL (ref 0.7–4.0)
MCH: 29.7 pg (ref 26.0–34.0)
MCHC: 30.4 g/dL (ref 30.0–36.0)
MCV: 97.6 fL (ref 80.0–100.0)
Monocytes Absolute: 0.8 10*3/uL (ref 0.1–1.0)
Monocytes Relative: 12 %
Neutro Abs: 4.2 10*3/uL (ref 1.7–7.7)
Neutrophils Relative %: 67 %
Platelets: 214 10*3/uL (ref 150–400)
RBC: 3.77 MIL/uL — ABNORMAL LOW (ref 3.87–5.11)
RDW: 14.2 % (ref 11.5–15.5)
WBC: 6.2 10*3/uL (ref 4.0–10.5)
nRBC: 0 % (ref 0.0–0.2)

## 2023-09-29 LAB — COMPREHENSIVE METABOLIC PANEL
ALT: 11 U/L (ref 0–44)
AST: 11 U/L — ABNORMAL LOW (ref 15–41)
Albumin: 3.8 g/dL (ref 3.5–5.0)
Alkaline Phosphatase: 100 U/L (ref 38–126)
Anion gap: 8 (ref 5–15)
BUN: 34 mg/dL — ABNORMAL HIGH (ref 8–23)
CO2: 23 mmol/L (ref 22–32)
Calcium: 11.2 mg/dL — ABNORMAL HIGH (ref 8.9–10.3)
Chloride: 107 mmol/L (ref 98–111)
Creatinine, Ser: 1.34 mg/dL — ABNORMAL HIGH (ref 0.44–1.00)
GFR, Estimated: 40 mL/min — ABNORMAL LOW (ref >60)
Glucose, Bld: 101 mg/dL — ABNORMAL HIGH (ref 70–99)
Potassium: 4.3 mmol/L (ref 3.5–5.1)
Sodium: 138 mmol/L (ref 135–145)
Total Bilirubin: 0.2 mg/dL (ref 0.0–1.2)
Total Protein: 6.4 g/dL — ABNORMAL LOW (ref 6.5–8.1)

## 2023-09-29 LAB — VITAMIN B12: Vitamin B-12: 1536 pg/mL — ABNORMAL HIGH (ref 180–914)

## 2023-09-29 LAB — FERRITIN: Ferritin: 20 ng/mL (ref 11–307)

## 2023-09-29 LAB — IRON AND TIBC
Iron: 24 ug/dL — ABNORMAL LOW (ref 28–170)
Saturation Ratios: 7 % — ABNORMAL LOW (ref 10.4–31.8)
TIBC: 362 ug/dL (ref 250–450)
UIBC: 338 ug/dL

## 2023-09-29 LAB — VITAMIN D 25 HYDROXY (VIT D DEFICIENCY, FRACTURES): Vit D, 25-Hydroxy: 40.3 ng/mL (ref 30–100)

## 2023-10-06 ENCOUNTER — Inpatient Hospital Stay: Payer: PPO | Admitting: Oncology

## 2023-10-06 VITALS — BP 126/79 | HR 72 | Temp 98.1°F | Resp 18

## 2023-10-06 DIAGNOSIS — D649 Anemia, unspecified: Secondary | ICD-10-CM | POA: Diagnosis not present

## 2023-10-06 DIAGNOSIS — D500 Iron deficiency anemia secondary to blood loss (chronic): Secondary | ICD-10-CM | POA: Diagnosis not present

## 2023-10-06 NOTE — Progress Notes (Signed)
 [facility] [address], [place] [address]   CLINIC:  Medical Oncology/Hematology  PCP:  Gabriel Earing, FNP [address] [place] [address] [phone]   REASON FOR VISIT:  Follow-up for iron deficiency anemia   PRIOR THERAPY: Blood transfusions and oral iron tablets   CURRENT THERAPY: Intermittent IV iron  INTERVAL HISTORY:   [name] [name] 83 y.o. female returns for routine follow-up of her iron deficiency anemia.  She was last seen by me on 06/30/2023.  She had Watchman device placed on 06/15/2023 for atrial fibrillation as she was felt to be a poor candidate for long-term anticoagulation.  Procedure went well.  She was instructed to reduce Eliquis to 5 mg daily for 45 days and then stop.  She was this started on Plavix 75 mg daily for 6 months.  On 08/18/2023 she had post Watchman placement and esophageal echocardiogram which showed good placement but was found to be in atrial fibrillation with heart rate around 110.  She was started on Toprol 12.5 mg which has since been increased to 25mg .  Appetite is 100% energy levels are 50%.  Denies any pain.  Has occasional cough, constipation and headaches.  She received 2 doses of IV on 07/06/2023 and 07/13/2023. She continues to deny any noticeable bleeding per rectum, melena or hematochezia.  She is taking iron 325 mg supplements daily without any GI upset.  Reports her PCP took her off her vitamin D supplements which she had been on for years.  She is curious as to what her levels are.  Had EGD on 02/01/2023 following a significant drop in her hemoglobin which showed a small hiatal hernia, gastric antral vascular ectasia without bleeding which was treated with ACP and single duodenal polyp that was resected.   ASSESSMENT & PLAN:  1.  Iron deficiency anemia secondary to GI blood loss - Normocytic anemia secondary to chronic blood loss, may also have some anemia related to CKD stage IIIb - Hematology work-up  (02/24/2022):  Ferritin 11, iron saturation 7%.  Normal folate, B12, MMA, copper. Reticulocytes 3.9% (consistent with acute blood loss), LDH normal, haptoglobin normal, DAT negative SPEP negative for monoclonal protein, but showed elevation in acute phase proteins - She has required intermittent blood transfusions, most recently on 02/24/2022 (Hgb 6.7) and again on 03/03/2022 (Hgb 6.6).  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  Normal duodenum and jejunum. - Colonoscopy (12/08/2019): Diverticulosis and polyps -Had EGD on 02/01/23 which showed small hiatal hernia, gastric antral vascular ectasia without bleeding that was treated with argon plasma coagulation (ACP) and a single duodenal polyp that was resected and retrieved - She denies any overt melena - She is on Plavix.  - Most recent IV Feraheme on 07/06/23 and 07/13/23. -She received a blood transfusion in ED on 01/27/23 for hemoglobin of 6.7.   2.   Unprovoked right leg DVT and PE: - Doppler on 04/03/2019: DVT in the femoral vein and calf veins. - CT chest PE protocol (04/03/2019): Moderate size pulmonary emboli in the right lower lobe. - She is on Plavix daily, also for Afib   3.  Social/family history: - She lives at home with her older son.  She takes care of the garden and does laundry and her ADLs and IADLs. She was a retired tobacco former.  Never smoked. - Sister had anemia.  Mother had uterine cancer and sister had colon cancer.   Plan: Iron deficiency anemia secondary  to GI bleed: -Labs from 09/29/2023 show hemoglobin of 11.2, ferritin 20 and iron saturation 7%.  TIBC is normal at 362.   - We discussed additional IV iron.  Recommend 2-3 doses of IV Feraheme given over the next few weeks. -Continue oral iron supplement as well. -We did discuss that unfortunately sometimes as we get older we do not absorb iron as well.  Recommend she take her PPI at least 6 hours from her iron supplement.  Take iron with orange  juice or vitamin C supplement. -Recommend returning to clinic in approximately 3 months for follow-up and labs a few days before. -Goal hemoglobin is above 12 with a ferritin between 50 and 100.  Iron saturations between 20 and 30%.  2.  Atrial fibrillation: - Had Watchman device placed on 06/15/2023 with good tolerance.  She has been taken off of Eliquis and is now on Plavix 75 mg daily for 6 months.  She had a follow-up TEE on 08/18/2023 which showed A-fib heart rate 110 and she was started on Toprol XL 12.5 mg which has since been increased to 25 mg with good tolerance. -Continue follow-up with cardiology.  3.  Vitamin D deficiency: -She is currently not taking supplements. -Last vitamin D level was checked on 04/20/2023 which was normal at 34.2.  We will recheck at next visit.    PLAN SUMMARY: >> IV Feraheme x 2-3 >> Labs in 3 months = CBC/D, ferritin, iron/TIBC, BMP >> RTC 1 week later for review of labs and assessment.     REVIEW OF SYSTEMS:   Review of Systems  Constitutional:  Positive for fatigue.  Respiratory:  Positive for shortness of breath.   Cardiovascular:  Positive for palpitations.  Gastrointestinal:  Negative for diarrhea and nausea.  Neurological:  Positive for headaches.     PHYSICAL EXAM:  ECOG PERFORMANCE STATUS: 2 - Symptomatic, <50% confined to bed  Vitals:   10/06/23 1131  BP: 126/79  Pulse: 72  Resp: 18  Temp: 98.1 F (36.7 C)  SpO2: 96%   There were no vitals filed for this visit.  Physical Exam Constitutional:      Appearance: Normal appearance.  Cardiovascular:     Rate and Rhythm: Normal rate and regular rhythm.  Pulmonary:     Effort: Pulmonary effort is normal.     Breath sounds: Normal breath sounds.  Abdominal:     General: Bowel sounds are normal.     Palpations: Abdomen is soft.  Musculoskeletal:        General: No swelling. Normal range of motion.  Neurological:     Mental Status: She is alert and oriented to person, place,  and time. Mental status is at baseline.     PAST MEDICAL/SURGICAL HISTORY:  Past Medical History:  Diagnosis Date   Anemia    Arthritis    Ostearthritis- hips, knees, fingers   Atrial fibrillation (HCC)    Basal cell carcinoma (BCC) of right hand 10/2021   Dx by [facility]   DVT (deep venous thrombosis) (HCC)    Dyspnea    Dysrhythmia    GERD (gastroesophageal reflux disease)    Headache(784.0)    tx. Valproic acid   History of diabetes mellitus    History of kidney stones    Hypertension    Hypothyroidism    Presence of Watchman left atrial appendage closure device 06/15/2023   24mm Watchman FLX Pro device placed by Dr. Lalla Brothers   Pulmonary embolism [facility])    Past Surgical  History:  Procedure Laterality Date   ABDOMINAL HYSTERECTOMY     BILATERAL HIP ARTHROSCOPY Left    BIOPSY  12/08/2019   Procedure: BIOPSY;  Surgeon: Corbin Ade, MD;  [facility];  Service: Endoscopy;;   CATARACT EXTRACTION, BILATERAL     COLONOSCOPY N/A 12/08/2019   polyps (tubular adenoma), diverticulosis, colonic lipoma, no surveillance due to age   CYSTOSCOPY W/ URETERAL STENT PLACEMENT Right 12/25/2020   Procedure: CYSTOSCOPY WITH RETROGRADE PYELOGRAM/URETERAL STENT PLACEMENT;  Surgeon: Jannifer Hick, MD;  Location: [facility];  Service: Urology;  Laterality: Right;   CYSTOSCOPY/URETEROSCOPY/HOLMIUM LASER/STENT PLACEMENT Right 01/15/2021   Procedure: CYSTOSCOPY/RETROGRADE/URETEROSCOPY/HOLMIUM LASER/STENT EXCHANGE;  Surgeon: Jannifer Hick, MD;  Location: [facility];  Service: Urology;  Laterality: Right;   ESOPHAGOGASTRODUODENOSCOPY N/A 12/08/2019   normal esophagus with 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   ESOPHAGOGASTRODUODENOSCOPY (EGD) WITH PROPOFOL N/A 05/18/2021   Surgeon: Vida Rigger, MD;   Tiny hiatal hernia, gastric antral vascular ectasia s/p ablation, normal duodenum, normal jejunum.   ESOPHAGOGASTRODUODENOSCOPY (EGD) WITH PROPOFOL N/A  03/10/2022   Procedure: ESOPHAGOGASTRODUODENOSCOPY (EGD) WITH PROPOFOL;  Surgeon: Lanelle Bal, DO;  [facility];  Service: Endoscopy;  Laterality: N/A;  10:45am   ESOPHAGOGASTRODUODENOSCOPY (EGD) WITH PROPOFOL N/A 02/01/2023   Procedure: ESOPHAGOGASTRODUODENOSCOPY (EGD) WITH PROPOFOL;  Surgeon: Lanelle Bal, DO;  [facility];  Service: Endoscopy;  Laterality: N/A;  9:00 am, asa 3   GIVENS CAPSULE STUDY N/A 01/13/2020   Procedure: GIVENS CAPSULE STUDY;  Surgeon: Corbin Ade, MD;  [facility];  Service: Endoscopy;  Laterality: N/A;  7:30am   GIVENS CAPSULE STUDY N/A 05/04/2021   Procedure: GIVENS CAPSULE STUDY;  Surgeon: Vida Rigger, MD;  [facility];  Service: Endoscopy;  Laterality: N/A;   LEFT ATRIAL APPENDAGE OCCLUSION N/A 06/15/2023   Procedure: LEFT ATRIAL APPENDAGE OCCLUSION;  Surgeon: Lanier Prude, MD;  [facility];  Service: Cardiovascular;  Laterality: N/A;   MULTIPLE TOOTH EXTRACTIONS     60's   PARATHYROIDECTOMY     POLYPECTOMY  12/08/2019   Procedure: POLYPECTOMY;  Surgeon: Corbin Ade, MD;  [facility];  Service: Endoscopy;;   POLYPECTOMY  02/01/2023   Procedure: POLYPECTOMY;  Surgeon: Lanelle Bal, DO;  [facility];  Service: Endoscopy;;   RADIOFREQUENCY ABLATION  05/18/2021   Procedure: RADIO FREQUENCY ABLATION;  Surgeon: Vida Rigger, MD;  [facility];  Service: Endoscopy;;   TEE WITHOUT CARDIOVERSION N/A 07/06/2021   Procedure: TRANSESOPHAGEAL ECHOCARDIOGRAM (TEE);  Surgeon: Little Ishikawa, MD;  Location: [facility];  Service: Cardiovascular;  Laterality: N/A;   TEE WITHOUT CARDIOVERSION N/A 06/15/2023   Procedure: TRANSESOPHAGEAL ECHOCARDIOGRAM;  Surgeon: Lanier Prude, MD;  Location: [facility] INVASIVE CV LAB;  Service: Cardiovascular;  Laterality: N/A;   TOTAL HIP ARTHROPLASTY Right 10/29/2013   Procedure: RIGHT TOTAL HIP ARTHROPLASTY  ANTERIOR APPROACH;  Surgeon: Shelda Pal, MD;  [facility];  Service: Orthopedics;  Laterality: Right;   TRANSESOPHAGEAL ECHOCARDIOGRAM (CATH LAB) N/A 08/18/2023   Procedure: TRANSESOPHAGEAL ECHOCARDIOGRAM;  Surgeon: Thurmon Fair, MD;  [facility];  Service: Cardiovascular;  Laterality: N/A;    SOCIAL HISTORY:  Social History   Socioeconomic History   Marital status: Widowed    Spouse name: Not on file   Number of children: 4   Years of education: 22   Highest education level: 12th grade  Occupational History   Occupation: Retired  Comment: Tobacco Farming  Tobacco Use   Smoking status: Never   Smokeless tobacco: Never  Vaping Use   Vaping status: Never Used  Substance and Sexual Activity   Alcohol use: No    Alcohol/week: 0.0 standard drinks of alcohol   Drug use: No   Sexual activity: Not Currently  Other Topics Concern   Not on file  Social History Narrative   Patient is widowed and lives in a one story home. She has four adult children and one son lives with her.       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   Social Drivers of Corporate investment banker Strain: Low Risk  (12/27/2022)   Overall Financial Resource Strain (CARDIA)    Difficulty of Paying Living Expenses: Not hard at all  Food Insecurity: No Food Insecurity (06/15/2023)   Hunger Vital Sign    Worried About Running Out of Food in the Last Year: Never true    Ran Out of Food in the Last Year: Never true  Transportation Needs: No Transportation Needs (06/15/2023)   PRAPARE - Administrator, Civil Service (Medical): No    Lack of Transportation (Non-Medical): No  Physical Activity: Insufficiently Active (12/27/2022)   Exercise Vital Sign    Days of Exercise per Week: 3 days    Minutes of Exercise per Session: 30 min  Stress: No Stress Concern Present (12/27/2022)   [employer] of Occupational Health - Occupational Stress Questionnaire    Feeling of Stress : Not at  all  Social Connections: Moderately Isolated (12/27/2022)   Social Connection and Isolation Panel [NHANES]    Frequency of Communication with Friends and Family: More than three times a week    Frequency of Social Gatherings with Friends and Family: More than three times a week    Attends Religious Services: More than 4 times per year    Active Member of [employer] or Organizations: No    Attends Banker Meetings: Never    Marital Status: Widowed  Intimate Partner Violence: Not At Risk (06/15/2023)   Humiliation, Afraid, Rape, and Kick questionnaire    Fear of Current or Ex-Partner: No    Emotionally Abused: No    Physically Abused: No    Sexually Abused: No    FAMILY HISTORY:  Family History  Problem Relation Age of Onset   Colitis Sister 54       alive   Cancer Sister 75       colon   Cancer Mother 43       uterine   Heart disease Father 21       heart failure   Heart attack Brother 19   Healthy Daughter    Healthy Son    Pulmonary embolism Brother 46   Heart disease Brother    Healthy Son    Healthy Son     CURRENT MEDICATIONS:  Outpatient Encounter Medications as of 10/06/2023  Medication Sig   acetaminophen (TYLENOL) 325 MG tablet Take 2 tablets (650 mg total) by mouth every 6 (six) hours as needed for mild pain or headache (or Fever >/= 101).   allopurinol (ZYLOPRIM) 100 MG tablet Take 1 tablet (100 mg total) by mouth daily.   amLODipine (NORVASC) 5 MG tablet Take 1 tablet (5 mg total) by mouth daily.   atorvastatin (LIPITOR) 20 MG tablet Take 1 tablet by mouth once daily   cetirizine (ZYRTEC) 10 MG tablet Take 10 mg by mouth  daily.   clopidogrel (PLAVIX) 75 MG tablet Take 1 tablet (75 mg total) by mouth daily.   dapagliflozin propanediol (FARXIGA) 10 MG TABS tablet Take 1 tablet (10 mg total) by mouth daily.   ferrous sulfate 325 (65 FE) MG EC tablet Take 325 mg by mouth daily.   furosemide (LASIX) 20 MG tablet Take 1 tablet by mouth once daily   GNP  CRANBERRY EXTRACT PO Take 1 tablet by mouth in the morning.   levothyroxine (SYNTHROID) 50 MCG tablet TAKE 1 TABLET BY MOUTH ONCE DAILY BEFORE BREAKFAST   losartan (COZAAR) 25 MG tablet Take 1 tablet (25 mg total) by mouth daily.   metoprolol succinate (TOPROL XL) 25 MG 24 hr tablet Take 1 tablet (25 mg total) by mouth daily.   oxybutynin (DITROPAN-XL) 10 MG 24 hr tablet Take 1 tablet (10 mg total) by mouth at bedtime.   pantoprazole (PROTONIX) 40 MG tablet Take 1 tablet (40 mg total) by mouth 2 (two) times daily before a meal.   [DISCONTINUED] cholecalciferol (VITAMIN D3) 25 MCG (1000 UNIT) tablet Take 1,000 Units by mouth daily.   [DISCONTINUED] cyanocobalamin (VITAMIN B12) 1000 MCG tablet Take 1,000 mcg by mouth daily.   No facility-administered encounter medications on file as of 10/06/2023.    ALLERGIES:  No Known Allergies  LABORATORY DATA:  I have reviewed the labs as listed.  CBC    Component Value Date/Time   WBC 6.2 09/29/2023 1029   RBC 3.77 (L) 09/29/2023 1029   HGB 11.2 (L) 09/29/2023 1029   HGB 11.6 07/28/2023 1110   HCT 36.8 09/29/2023 1029   HCT 36.7 07/28/2023 1110   PLT 214 09/29/2023 1029   PLT 210 07/28/2023 1110   MCV 97.6 09/29/2023 1029   MCV 98 (H) 07/28/2023 1110   MCH 29.7 09/29/2023 1029   MCHC 30.4 09/29/2023 1029   RDW 14.2 09/29/2023 1029   RDW 13.0 07/28/2023 1110   LYMPHSABS 1.1 09/29/2023 1029   LYMPHSABS 0.9 12/02/2022 0957   MONOABS 0.8 09/29/2023 1029   EOSABS 0.1 09/29/2023 1029   EOSABS 0.1 12/02/2022 0957   BASOSABS 0.0 09/29/2023 1029   BASOSABS 0.0 12/02/2022 0957      Latest Ref Rng & Units 09/29/2023   10:29 AM 07/28/2023   11:10 AM 06/20/2023   11:28 AM  CMP  Glucose 70 - 99 mg/dL 161  096  045   BUN 8 - 23 mg/dL 34  24  25   Creatinine 0.44 - 1.00 mg/dL 4.09  8.11  9.14   Sodium 135 - 145 mmol/L 138  142  139   Potassium 3.5 - 5.1 mmol/L 4.3  4.9  3.7   Chloride 98 - 111 mmol/L 107  108  109   CO2 22 - 32 mmol/L 23  21  25     Calcium 8.9 - 10.3 mg/dL 78.2  95.6  21.3   Total Protein 6.5 - 8.1 g/dL 6.4     Total Bilirubin 0.0 - 1.2 mg/dL 0.2     Alkaline Phos 38 - 126 U/L 100     AST 15 - 41 U/L 11     ALT 0 - 44 U/L 11       DIAGNOSTIC IMAGING:  I have independently reviewed the relevant imaging and discussed with the patient.   WRAP UP:  All questions were answered. The patient knows to call the clinic with any problems, questions or concerns.  Medical decision making: Moderate  Time spent on visit: I  spent 20 minutes dedicated to the care of this patient (face-to-face and non-face-to-face) on the date of the encounter to include what is described in the assessment and plan.  Mauro Kaufmann, NP  10/09/23

## 2023-10-09 ENCOUNTER — Encounter: Payer: Self-pay | Admitting: Hematology

## 2023-10-12 ENCOUNTER — Inpatient Hospital Stay

## 2023-10-12 VITALS — BP 119/78 | HR 93 | Temp 98.3°F | Resp 19

## 2023-10-12 DIAGNOSIS — D500 Iron deficiency anemia secondary to blood loss (chronic): Secondary | ICD-10-CM

## 2023-10-12 MED ORDER — ACETAMINOPHEN 325 MG PO TABS: 650.0000 mg | ORAL_TABLET | Freq: Once | ORAL | Status: DC

## 2023-10-12 MED ORDER — CETIRIZINE HCL 10 MG PO TABS: 10.0000 mg | ORAL_TABLET | Freq: Once | ORAL | Status: DC

## 2023-10-12 MED ORDER — SODIUM CHLORIDE 0.9 % IV SOLN: Freq: Once | INTRAVENOUS | Status: AC

## 2023-10-12 MED ORDER — SODIUM CHLORIDE 0.9 % IV SOLN
510.0000 mg | Freq: Once | INTRAVENOUS | Status: AC
  Administered 2023-10-12: 510 mg via INTRAVENOUS
  Filled 2023-10-12: qty 510

## 2023-10-12 NOTE — Patient Instructions (Signed)

## 2023-10-12 NOTE — Progress Notes (Signed)
 Patient tolerated iron infusion with no complaints voiced.  Peripheral IV site clean and dry with good blood return noted before and after infusion.  Band aid applied.  Pt refused to wait 30 minutes post feraheme infusion. VSS with discharge and left in satisfactory condition with no s/s of distress noted. All follow ups as scheduled.   [name] [name] [employer]

## 2023-10-12 NOTE — Progress Notes (Signed)
Patient took own premeds from home.

## 2023-10-19 ENCOUNTER — Other Ambulatory Visit: Payer: Self-pay | Admitting: *Deleted

## 2023-10-19 ENCOUNTER — Other Ambulatory Visit: Payer: Self-pay | Admitting: Family Medicine

## 2023-10-19 DIAGNOSIS — E785 Hyperlipidemia, unspecified: Secondary | ICD-10-CM

## 2023-10-19 DIAGNOSIS — M1A9XX0 Chronic gout, unspecified, without tophus (tophi): Secondary | ICD-10-CM

## 2023-10-19 MED ORDER — ALLOPURINOL 100 MG PO TABS: 100.0000 mg | ORAL_TABLET | Freq: Every day | ORAL | 0 refills | Status: DC

## 2023-10-20 ENCOUNTER — Inpatient Hospital Stay

## 2023-10-20 VITALS — BP 114/75 | HR 88 | Temp 98.7°F | Resp 20

## 2023-10-20 DIAGNOSIS — D500 Iron deficiency anemia secondary to blood loss (chronic): Secondary | ICD-10-CM

## 2023-10-20 MED ORDER — SODIUM CHLORIDE 0.9 % IV SOLN: Freq: Once | INTRAVENOUS | Status: AC

## 2023-10-20 MED ORDER — SODIUM CHLORIDE 0.9 % IV SOLN
510.0000 mg | Freq: Once | INTRAVENOUS | Status: AC
  Administered 2023-10-20: 510 mg via INTRAVENOUS
  Filled 2023-10-20: qty 510

## 2023-10-20 NOTE — Patient Instructions (Signed)

## 2023-10-20 NOTE — Progress Notes (Signed)
 Patient took own tylenol and zyrtec from home.   Patient declined staying post iron infusion wait time.  Reviewed side effects and when to report to the ER with understanding verbalized.   Patient tolerated iron infusion with no complaints voiced.  Peripheral IV site clean and dry with good blood return noted before and after infusion.  Band aid applied.  VSS with discharge and left in satisfactory condition with no s/s of distress noted.

## 2023-10-27 ENCOUNTER — Inpatient Hospital Stay: Attending: Physician Assistant

## 2023-10-27 VITALS — BP 123/75 | HR 71 | Temp 97.1°F | Resp 19

## 2023-10-27 DIAGNOSIS — D500 Iron deficiency anemia secondary to blood loss (chronic): Secondary | ICD-10-CM | POA: Diagnosis not present

## 2023-10-27 DIAGNOSIS — Z79899 Other long term (current) drug therapy: Secondary | ICD-10-CM | POA: Insufficient documentation

## 2023-10-27 MED ORDER — SODIUM CHLORIDE 0.9 % IV SOLN
510.0000 mg | Freq: Once | INTRAVENOUS | Status: AC
  Administered 2023-10-27: 510 mg via INTRAVENOUS
  Filled 2023-10-27: qty 510

## 2023-10-27 MED ORDER — SODIUM CHLORIDE 0.9 % IV SOLN: Freq: Once | INTRAVENOUS | Status: AC

## 2023-10-27 NOTE — Progress Notes (Signed)
 Patient presents today for iron infusion.  Patient is in satisfactory condition with no new complaints voiced.  Vital signs are stable.  We will proceed with infusion per provider orders.    Pt took pre-meds Tylenol and Zyrtec at 1000 a.m. at home prior to arrival. Peripheral IV started with good blood return pre and post infusion.  Feraheme 510 mg  given today per MD orders. Tolerated infusion without adverse affects. Vital signs stable. No complaints at this time. Discharged from clinic ambulatory in stable condition. Alert and oriented x 3. F/U with [facility] as scheduled.

## 2023-10-27 NOTE — Patient Instructions (Signed)
 [facility] [place] - A DEPT OF [facility]  Discharge Instructions: Thank you for choosing [facility] to provide your oncology and hematology care.  If you have a lab appointment with the Cancer Center - please note that after April 8th, 2024, all labs will be drawn in the cancer center.  You do not have to check in or register with the main entrance as you have in the past but will complete your check-in in the cancer center.  Wear comfortable clothing and clothing appropriate for easy access to any Portacath or PICC line.   We strive to give you quality time with your provider. You may need to reschedule your appointment if you arrive late (15 or more minutes).  Arriving late affects you and other patients whose appointments are after yours.  Also, if you miss three or more appointments without notifying the office, you may be dismissed from the clinic at the provider's discretion.      For prescription refill requests, have your pharmacy contact our office and allow 72 hours for refills to be completed.    Today you received Feraheme IV iron infusion   BELOW ARE SYMPTOMS THAT SHOULD BE REPORTED IMMEDIATELY: *FEVER GREATER THAN 100.4 F (38 C) OR HIGHER *CHILLS OR SWEATING *NAUSEA AND VOMITING THAT IS NOT CONTROLLED WITH YOUR NAUSEA MEDICATION *UNUSUAL SHORTNESS OF BREATH *UNUSUAL BRUISING OR BLEEDING *URINARY PROBLEMS (pain or burning when urinating, or frequent urination) *BOWEL PROBLEMS (unusual diarrhea, constipation, pain near the anus) TENDERNESS IN MOUTH AND THROAT WITH OR WITHOUT PRESENCE OF ULCERS (sore throat, sores in mouth, or a toothache) UNUSUAL RASH, SWELLING OR PAIN  UNUSUAL VAGINAL DISCHARGE OR ITCHING   Items with * indicate a potential emergency and should be followed up as soon as possible or go to the Emergency Department if any problems should occur.  Please show the CHEMOTHERAPY ALERT CARD or IMMUNOTHERAPY ALERT CARD at  check-in to the Emergency Department and triage nurse.  Should you have questions after your visit or need to cancel or reschedule your appointment, please contact [facility] [place] - A DEPT OF Eligha Bridegroom [facility] [phone]  and follow the prompts.  Office hours are 8:00 a.m. to 4:30 p.m. Monday - Friday. Please note that voicemails left after 4:00 p.m. may not be returned until the following business day.  We are closed weekends and major holidays. You have access to a nurse at all times for urgent questions. Please call the main number to the clinic [phone] and follow the prompts.  For any non-urgent questions, you may also contact your provider using MyChart. We now offer e-Visits for anyone 69 and older to request care online for non-urgent symptoms. For details visit [email or web address].   Also download the MyChart app! Go to the app store, search "MyChart", open the app, select [place], and log in with your MyChart username and password.

## 2023-11-14 ENCOUNTER — Other Ambulatory Visit: Payer: Self-pay | Admitting: *Deleted

## 2023-11-14 DIAGNOSIS — M1A9XX0 Chronic gout, unspecified, without tophus (tophi): Secondary | ICD-10-CM

## 2023-11-14 MED ORDER — PANTOPRAZOLE SODIUM 40 MG PO TBEC: 40.0000 mg | DELAYED_RELEASE_TABLET | Freq: Two times a day (BID) | ORAL | 0 refills | Status: DC

## 2023-11-14 MED ORDER — ALLOPURINOL 100 MG PO TABS: 100.0000 mg | ORAL_TABLET | Freq: Every day | ORAL | 0 refills | Status: DC

## 2023-11-14 NOTE — Addendum Note (Signed)
 Addended by: Ramesha Poster D on: 11/14/2023 01:48 PM   Modules accepted: Orders

## 2023-11-14 NOTE — Telephone Encounter (Signed)
 [name] [name] NTBS for 6 mos FU from Sept OV RF NOT sent to mail order pharmacy

## 2023-11-14 NOTE — Telephone Encounter (Signed)
 Patient has appt 11-24-2023 with Tiffany.

## 2023-11-15 ENCOUNTER — Other Ambulatory Visit: Payer: Self-pay | Admitting: *Deleted

## 2023-11-15 MED ORDER — OXYBUTYNIN CHLORIDE ER 10 MG PO TB24: 10.0000 mg | ORAL_TABLET | Freq: Every day | ORAL | 0 refills | Status: DC

## 2023-11-23 ENCOUNTER — Other Ambulatory Visit: Payer: Self-pay | Admitting: Family Medicine

## 2023-11-23 DIAGNOSIS — E785 Hyperlipidemia, unspecified: Secondary | ICD-10-CM

## 2023-11-24 ENCOUNTER — Encounter: Payer: Self-pay | Admitting: Family Medicine

## 2023-11-24 ENCOUNTER — Ambulatory Visit (INDEPENDENT_AMBULATORY_CARE_PROVIDER_SITE_OTHER): Admitting: Family Medicine

## 2023-11-24 VITALS — BP 124/78 | HR 74 | Temp 97.5°F | Ht 63.0 in | Wt 206.0 lb

## 2023-11-24 DIAGNOSIS — E210 Primary hyperparathyroidism: Secondary | ICD-10-CM

## 2023-11-24 DIAGNOSIS — I4811 Longstanding persistent atrial fibrillation: Secondary | ICD-10-CM | POA: Diagnosis not present

## 2023-11-24 DIAGNOSIS — R7303 Prediabetes: Secondary | ICD-10-CM

## 2023-11-24 DIAGNOSIS — N1831 Chronic kidney disease, stage 3a: Secondary | ICD-10-CM | POA: Diagnosis not present

## 2023-11-24 DIAGNOSIS — I10 Essential (primary) hypertension: Secondary | ICD-10-CM

## 2023-11-24 DIAGNOSIS — E782 Mixed hyperlipidemia: Secondary | ICD-10-CM | POA: Diagnosis not present

## 2023-11-24 DIAGNOSIS — E785 Hyperlipidemia, unspecified: Secondary | ICD-10-CM

## 2023-11-24 DIAGNOSIS — N3001 Acute cystitis with hematuria: Secondary | ICD-10-CM | POA: Diagnosis not present

## 2023-11-24 DIAGNOSIS — M1A9XX0 Chronic gout, unspecified, without tophus (tophi): Secondary | ICD-10-CM

## 2023-11-24 DIAGNOSIS — N3281 Overactive bladder: Secondary | ICD-10-CM

## 2023-11-24 DIAGNOSIS — E039 Hypothyroidism, unspecified: Secondary | ICD-10-CM

## 2023-11-24 DIAGNOSIS — K219 Gastro-esophageal reflux disease without esophagitis: Secondary | ICD-10-CM

## 2023-11-24 DIAGNOSIS — R300 Dysuria: Secondary | ICD-10-CM | POA: Diagnosis not present

## 2023-11-24 DIAGNOSIS — I5020 Unspecified systolic (congestive) heart failure: Secondary | ICD-10-CM

## 2023-11-24 LAB — MICROSCOPIC EXAMINATION: Renal Epithel, UA: NONE SEEN /HPF

## 2023-11-24 LAB — URINALYSIS, ROUTINE W REFLEX MICROSCOPIC
Bilirubin, UA: NEGATIVE
Ketones, UA: NEGATIVE
Nitrite, UA: NEGATIVE
Protein,UA: NEGATIVE
Specific Gravity, UA: <1.005 — ABNORMAL LOW (ref 1.005–1.030)
Urobilinogen, Ur: 0.2 mg/dL (ref 0.2–1.0)
pH, UA: 5 (ref 5.0–7.5)

## 2023-11-24 MED ORDER — ALLOPURINOL 100 MG PO TABS: 100.0000 mg | ORAL_TABLET | Freq: Every day | ORAL | 0 refills | Status: DC

## 2023-11-24 MED ORDER — OXYBUTYNIN CHLORIDE ER 10 MG PO TB24: 10.0000 mg | ORAL_TABLET | Freq: Every day | ORAL | 0 refills | Status: DC

## 2023-11-24 MED ORDER — CEPHALEXIN 500 MG PO CAPS: 500.0000 mg | ORAL_CAPSULE | Freq: Two times a day (BID) | ORAL | 0 refills | Status: AC

## 2023-11-24 MED ORDER — ATORVASTATIN CALCIUM 20 MG PO TABS: 20.0000 mg | ORAL_TABLET | Freq: Every day | ORAL | 3 refills | Status: AC

## 2023-11-24 MED ORDER — PANTOPRAZOLE SODIUM 40 MG PO TBEC: 40.0000 mg | DELAYED_RELEASE_TABLET | Freq: Two times a day (BID) | ORAL | 0 refills | Status: DC

## 2023-11-24 MED ORDER — LEVOTHYROXINE SODIUM 50 MCG PO TABS: 50.0000 ug | ORAL_TABLET | Freq: Every day | ORAL | 2 refills | Status: AC

## 2023-11-24 MED ORDER — LOSARTAN POTASSIUM 25 MG PO TABS: 25.0000 mg | ORAL_TABLET | Freq: Every day | ORAL | 3 refills | Status: DC

## 2023-11-24 NOTE — Progress Notes (Unsigned)
 Established Patient Office Visit  Subjective   Patient ID: [name] [name], female    DOB: 10-Jun-1941  Age: 83 y.o. MRN: [id number]  Chief Complaint  Patient presents with   Medical Management of Chronic Issues    HPI  HTN Complaint with meds - Yes Current Medications - amlodipine  5 mg, losartan  25 mg, lasix  20 mg Checking BP at home ranging 120-130s/60-80s Pertinent ROS:  Headache - No Fatigue - No Visual Disturbances - No Chest pain - No Dyspnea - with exertion Palpitations - No LE edema - baseline  Had watchman device placed. Eliquis  was d/c. Will discuss d/c of plavix  later this month.   2. HLD She is on atorvastatin . Last LDL at 79.  3. GERD Compliant with medications - Yes Current medications - protonix  40 mg Cough - No Sore throat - No Voice change - No Hemoptysis - No Dysphagia or dyspepsia - No Water  brash - No Red Flags (weight loss, hematochezia, melena, weight loss, early satiety, fevers, odynophagia, or persistent vomiting) - No  4. Thyroid  Compliant with levothyroxine . Denies symptoms. Sees endo for hyperparathyroidism.    5. Anemia Recently had an iron infusion. Feeling better since then.   6. Urinary symptoms Dysuria for the last few days, getting worse. Frequency, urgency, with hesitancy, darker in color. No fever, hematuria, flank pain.   7. Gout Taking allopurinol . No flares.    Past Medical History:  Diagnosis Date   Anemia    Arthritis    Ostearthritis- hips, knees, fingers   Atrial fibrillation (HCC)    Basal cell carcinoma (BCC) of right hand 10/2021   Dx by [facility]   DVT (deep venous thrombosis) (HCC)    Dyspnea    Dysrhythmia    GERD (gastroesophageal reflux disease)    Headache(784.0)    tx. Valproic  acid   History of diabetes mellitus    History of kidney stones    Hypertension    Hypothyroidism    Presence of Watchman left atrial appendage closure device 06/15/2023   24mm Watchman FLX Pro device placed by  Dr. Marven Slimmer   Pulmonary embolism (HCC)       ROS As per HPI.    Objective:     BP 124/78   Pulse 74   Temp (!) 97.5 F (36.4 C)   Ht 5\' 3"  (1.6 m)   Wt 206 lb (93.4 kg)   BMI 36.49 kg/m   Wt Readings from Last 3 Encounters:  08/21/23 201 lb 3.2 oz (91.3 kg)  08/18/23 202 lb (91.6 kg)  07/28/23 204 lb (92.5 kg)     Physical Exam Vitals and nursing note reviewed.  Constitutional:      General: She is not in acute distress.    Appearance: She is obese. She is not ill-appearing, toxic-appearing or diaphoretic.  Neck:     Vascular: No carotid bruit.  Cardiovascular:     Rate and Rhythm: Normal rate. Rhythm irregularly irregular.     Heart sounds: Normal heart sounds. No murmur heard. Pulmonary:     Effort: Pulmonary effort is normal. No respiratory distress.     Breath sounds: Normal breath sounds. No wheezing or rhonchi.  Abdominal:     General: Bowel sounds are normal. There is no distension.     Palpations: Abdomen is soft.     Tenderness: There is no abdominal tenderness. There is no guarding or rebound.  Musculoskeletal:     Cervical back: Neck supple. No rigidity.  Right lower leg: No edema.     Left lower leg: No edema.  Skin:    General: Skin is warm and dry.  Neurological:     General: No focal deficit present.     Mental Status: She is alert and oriented to person, place, and time.     Gait: Gait abnormal (using cane).  Psychiatric:        Mood and Affect: Mood normal.        Behavior: Behavior normal.        Thought Content: Thought content normal.        Judgment: Judgment normal.    No results found for any visits on 11/24/23.   The ASCVD Risk score (Arnett DK, et al., 2019) failed to calculate for the following reasons:   The 2019 ASCVD risk score is only valid for ages 29 to 75    Assessment & Plan:     No follow-ups on file.   The patient indicates understanding of these issues and agrees with the plan.  Albertha Huger,  FNP

## 2023-11-27 ENCOUNTER — Telehealth: Payer: Self-pay | Admitting: Physician Assistant

## 2023-11-27 NOTE — Telephone Encounter (Signed)
 Called to check in with patient, who had LAAO on 06/15/23. Plan to stop Plavix  now as it has been ~6 months since Watchman. I do not feel strongly about starting her on a baby aspirin  (only has aortic atherosclerosis) The patient reports doing well with no issues.  The patient understands to call with questions or concerns.

## 2023-11-28 LAB — URINE CULTURE

## 2023-11-29 ENCOUNTER — Other Ambulatory Visit: Payer: Self-pay | Admitting: *Deleted

## 2023-11-29 DIAGNOSIS — N3001 Acute cystitis with hematuria: Secondary | ICD-10-CM

## 2023-12-11 ENCOUNTER — Ambulatory Visit: Admitting: Physician Assistant

## 2023-12-15 ENCOUNTER — Ambulatory Visit: Payer: PPO

## 2023-12-22 ENCOUNTER — Other Ambulatory Visit

## 2023-12-22 DIAGNOSIS — N3001 Acute cystitis with hematuria: Secondary | ICD-10-CM

## 2023-12-24 LAB — URINE CULTURE

## 2023-12-25 ENCOUNTER — Ambulatory Visit: Payer: Self-pay | Admitting: Family Medicine

## 2023-12-28 ENCOUNTER — Ambulatory Visit: Payer: PPO

## 2023-12-28 VITALS — BP 124/78 | HR 74 | Ht 63.0 in | Wt 206.0 lb

## 2023-12-28 DIAGNOSIS — Z0000 Encounter for general adult medical examination without abnormal findings: Secondary | ICD-10-CM

## 2023-12-28 NOTE — Patient Instructions (Signed)
 [name] [name] , Thank you for taking time out of your busy schedule to complete your Annual Wellness Visit with me. I enjoyed our conversation and look forward to speaking with you again next year. I, as well as your care team,  appreciate your ongoing commitment to your health goals. Please review the following plan we discussed and let me know if I can assist you in the future. Your Game plan/ To Do List    Follow up Visits: Next Medicare AWV with our clinical staff: 12/30/24 at 8:40a.m.   Next Office Visit with your provider: n/a  Clinician Recommendations:  Aim for 30 minutes of exercise or brisk walking, 6-8 glasses of water , and 5 servings of fruits and vegetables each day.       This is a list of the screening recommended for you and due dates:  Health Maintenance  Topic Date Due   DTaP/Tdap/Td vaccine (1 - Tdap) Never done   COVID-19 Vaccine (3 - Moderna risk series) 01/12/2025*   Flu Shot  02/23/2024   DEXA scan (bone density measurement)  10/28/2024   Medicare Annual Wellness Visit  12/27/2024   Pneumonia Vaccine  Completed   Zoster (Shingles) Vaccine  Completed   HPV Vaccine  Aged Out   Meningitis B Vaccine  Aged Out  *Topic was postponed. The date shown is not the original due date.    Advanced directives: (In Chart) A copy of your advanced directives are scanned into your chart should your provider ever need it. Advance Care Planning is important because it:  [x]  Makes sure you receive the medical care that is consistent with your values, goals, and preferences  [x]  It provides guidance to your family and loved ones and reduces their decisional burden about whether or not they are making the right decisions based on your wishes.  Follow the link provided in your after visit summary or read over the paperwork we have mailed to you to help you started getting your Advance Directives in place. If you need assistance in completing these, please reach out to us  so that we can help  you!  See attachments for Preventive Care and Fall Prevention Tips.

## 2023-12-28 NOTE — Progress Notes (Signed)
 Subjective:   [name] [name] is a 83 y.o. who presents for a Medicare Wellness preventive visit.  As a reminder, Annual Wellness Visits don't include a physical exam, and some assessments may be limited, especially if this visit is performed virtually. We may recommend an in-person follow-up visit with your provider if needed.  Visit Complete: Virtual I connected with  Shahara Rierson Senn on 12/28/23 by a audio enabled telemedicine application and verified that I am speaking with the correct person using two identifiers.  Patient Location: Home  Provider Location: Home Office  I discussed the limitations of evaluation and management by telemedicine. The patient expressed understanding and agreed to proceed.  Vital Signs: Because this visit was a virtual/telehealth visit, some criteria may be missing or patient reported. Any vitals not documented were not able to be obtained and vitals that have been documented are patient reported.  VideoDeclined- This patient declined Librarian, academic. Therefore the visit was completed with audio only.  Persons Participating in Visit: Patient.  AWV Questionnaire: Yes: Patient Medicare AWV questionnaire was completed by the patient on 12/27/23; I have confirmed that all information answered by patient is correct and no changes since this date.  Cardiac Risk Factors include: advanced age (>18men, >24 women);dyslipidemia;hypertension;obesity (BMI >30kg/m2)     Objective:     Today's Vitals   12/28/23 0955  BP: 124/78  Pulse: 74  Weight: 206 lb (93.4 kg)  Height: 5\' 3"  (1.6 m)   Body mass index is 36.49 kg/m.     12/28/2023    9:48 AM 10/27/2023   11:30 AM 10/06/2023   11:30 AM 07/13/2023   12:06 PM 06/30/2023    9:54 AM 06/15/2023    9:43 AM 05/05/2023    8:30 AM  Advanced Directives  Does Patient Have a Medical Advance Directive? Yes No Yes No No No Yes  Type of Estate agent of  [place];Living will  Healthcare Power of [place];Living will Healthcare Power of [place];Living will Healthcare Power of [place];Living will  Living will;Healthcare Power of Attorney  Does patient want to make changes to medical advance directive?  No - Patient declined  No - Patient declined No - Patient declined  No - Patient declined  Copy of Healthcare Power of Attorney in Chart? Yes - validated most recent copy scanned in chart (See row information)  Yes - validated most recent copy scanned in chart (See row information) No - copy requested Yes - validated most recent copy scanned in chart (See row information)  Yes - validated most recent copy scanned in chart (See row information)  Would patient like information on creating a medical advance directive?  No - Patient declined No - Patient declined  No - Patient declined No - Patient declined No - Patient declined  Pre-existing out of facility DNR order (yellow form or pink MOST form)       Yellow form placed in chart (order not valid for inpatient use)    Current Medications (verified) Outpatient Encounter Medications as of 12/28/2023  Medication Sig   acetaminophen  (TYLENOL ) 325 MG tablet Take 2 tablets (650 mg total) by mouth every 6 (six) hours as needed for mild pain or headache (or Fever >/= 101).   allopurinol  (ZYLOPRIM ) 100 MG tablet Take 1 tablet (100 mg total) by mouth daily.   amLODipine  (NORVASC ) 5 MG tablet Take 1 tablet (5 mg total) by mouth daily.   atorvastatin  (LIPITOR) 20 MG tablet Take 1 tablet (20  mg total) by mouth daily.   cetirizine  (ZYRTEC ) 10 MG tablet Take 10 mg by mouth daily.   dapagliflozin  propanediol (FARXIGA ) 10 MG TABS tablet Take 1 tablet (10 mg total) by mouth daily.   ferrous sulfate  325 (65 FE) MG EC tablet Take 325 mg by mouth daily.   furosemide  (LASIX ) 20 MG tablet Take 1 tablet by mouth once daily   GNP CRANBERRY EXTRACT PO Take 1 tablet by mouth in the morning.   levothyroxine  (SYNTHROID ) 50 MCG  tablet Take 1 tablet (50 mcg total) by mouth daily before breakfast.   losartan  (COZAAR ) 25 MG tablet Take 1 tablet (25 mg total) by mouth daily.   metoprolol  succinate (TOPROL  XL) 25 MG 24 hr tablet Take 1 tablet (25 mg total) by mouth daily.   oxybutynin  (DITROPAN -XL) 10 MG 24 hr tablet Take 1 tablet (10 mg total) by mouth at bedtime.   pantoprazole  (PROTONIX ) 40 MG tablet Take 1 tablet (40 mg total) by mouth 2 (two) times daily before a meal.   No facility-administered encounter medications on file as of 12/28/2023.    Allergies (verified) Patient has no known allergies.   History: Past Medical History:  Diagnosis Date   Anemia    Arthritis    Ostearthritis- hips, knees, fingers   Atrial fibrillation (HCC)    Basal cell carcinoma (BCC) of right hand 10/2021   Dx by [facility]   Blood transfusion without reported diagnosis 2021   Chronic kidney disease    DVT (deep venous thrombosis) (HCC)    Dyspnea    Dysrhythmia    GERD (gastroesophageal reflux disease)    Headache(784.0)    tx. Valproic  acid   History of diabetes mellitus    History of kidney stones    Hypertension    Hypothyroidism    Presence of Watchman left atrial appendage closure device 06/15/2023   24mm Watchman FLX Pro device placed by Dr. Marven Slimmer   Pulmonary embolism [facility])    Past Surgical History:  Procedure Laterality Date   ABDOMINAL HYSTERECTOMY     BILATERAL HIP ARTHROSCOPY Left    BIOPSY  12/08/2019   Procedure: BIOPSY;  Surgeon: Suzette Espy, MD;  [facility];  Service: Endoscopy;;   CATARACT EXTRACTION, BILATERAL     COLONOSCOPY N/A 12/08/2019   polyps (tubular adenoma), diverticulosis, colonic lipoma, no surveillance due to age   CYSTOSCOPY W/ URETERAL STENT PLACEMENT Right 12/25/2020   Procedure: CYSTOSCOPY WITH RETROGRADE PYELOGRAM/URETERAL STENT PLACEMENT;  Surgeon: Lahoma Pigg, MD;  Location: [facility];  Service: Urology;  Laterality: Right;   CYSTOSCOPY/URETEROSCOPY/HOLMIUM  LASER/STENT PLACEMENT Right 01/15/2021   Procedure: CYSTOSCOPY/RETROGRADE/URETEROSCOPY/HOLMIUM LASER/STENT EXCHANGE;  Surgeon: Lahoma Pigg, MD;  [facility];  Service: Urology;  Laterality: Right;   ESOPHAGOGASTRODUODENOSCOPY N/A 12/08/2019   normal esophagus with 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   ESOPHAGOGASTRODUODENOSCOPY (EGD) WITH PROPOFOL  N/A 05/18/2021   Surgeon: Magod, Marc, MD;   Tiny hiatal hernia, gastric antral vascular ectasia s/p ablation, normal duodenum, normal jejunum.   ESOPHAGOGASTRODUODENOSCOPY (EGD) WITH PROPOFOL  N/A 03/10/2022   Procedure: ESOPHAGOGASTRODUODENOSCOPY (EGD) WITH PROPOFOL ;  Surgeon: Vinetta Greening, DO;  [facility];  Service: Endoscopy;  Laterality: N/A;  10:45am   ESOPHAGOGASTRODUODENOSCOPY (EGD) WITH PROPOFOL  N/A 02/01/2023   Procedure: ESOPHAGOGASTRODUODENOSCOPY (EGD) WITH PROPOFOL ;  Surgeon: Vinetta Greening, DO;  [facility];  Service: Endoscopy;  Laterality: N/A;  9:00 am, asa 3   EYE SURGERY  2015   Cataracts  GIVENS CAPSULE STUDY N/A 01/13/2020   Procedure: GIVENS CAPSULE STUDY;  Surgeon: Suzette Espy, MD;  [facility];  Service: Endoscopy;  Laterality: N/A;  7:30am   GIVENS CAPSULE STUDY N/A 05/04/2021   Procedure: GIVENS CAPSULE STUDY;  Surgeon: Ozell Blunt, MD;  [facility];  Service: Endoscopy;  Laterality: N/A;   JOINT REPLACEMENT  2012 2015   Hips   LEFT ATRIAL APPENDAGE OCCLUSION N/A 06/15/2023   Procedure: LEFT ATRIAL APPENDAGE OCCLUSION;  Surgeon: Boyce Byes, MD;  [facility];  Service: Cardiovascular;  Laterality: N/A;   MULTIPLE TOOTH EXTRACTIONS     60's   PARATHYROIDECTOMY     POLYPECTOMY  12/08/2019   Procedure: POLYPECTOMY;  Surgeon: Suzette Espy, MD;  [facility];  Service: Endoscopy;;   POLYPECTOMY  02/01/2023   Procedure: POLYPECTOMY;  Surgeon: Vinetta Greening, DO;  [facility];  Service: Endoscopy;;   RADIOFREQUENCY ABLATION  05/18/2021   Procedure: RADIO FREQUENCY ABLATION;  Surgeon: Ozell Blunt, MD;  [facility];  Service: Endoscopy;;   TEE WITHOUT CARDIOVERSION N/A 07/06/2021   Procedure: TRANSESOPHAGEAL ECHOCARDIOGRAM (TEE);  Surgeon: Wendie Hamburg, MD;  Location: [facility] ENDOSCOPY;  Service: Cardiovascular;  Laterality: N/A;   TEE WITHOUT CARDIOVERSION N/A 06/15/2023   Procedure: TRANSESOPHAGEAL ECHOCARDIOGRAM;  Surgeon: Boyce Byes, MD;  [facility] INVASIVE CV LAB;  Service: Cardiovascular;  Laterality: N/A;   TOTAL HIP ARTHROPLASTY Right 10/29/2013   Procedure: RIGHT TOTAL HIP ARTHROPLASTY ANTERIOR APPROACH;  Surgeon: Bevin Bucks, MD;  [facility];  Service: Orthopedics;  Laterality: Right;   TRANSESOPHAGEAL ECHOCARDIOGRAM (CATH LAB) N/A 08/18/2023   Procedure: TRANSESOPHAGEAL ECHOCARDIOGRAM;  Surgeon: Luana Rumple, MD;  [facility];  Service: Cardiovascular;  Laterality: N/A;   TUBAL LIGATION  1972   Family History  Problem Relation Age of Onset   Colitis Sister 54       alive   Cancer Sister 82       colon   Cancer Mother 18       uterine   Heart disease Father 85       heart failure   Heart attack Brother 18   Healthy Daughter    Healthy Son    Pulmonary embolism Brother 46   Heart disease Brother    Healthy Son    Healthy Son    Social History   Socioeconomic History   Marital status: Widowed    Spouse name: Not on file   Number of children: 4   Years of education: 28   Highest education level: 12th grade  Occupational History   Occupation: Retired    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 She has four adult children and one son lives with her.       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   Social Drivers of Corporate investment banker Strain: Low Risk  (11/23/2023)   Overall Financial Resource Strain (CARDIA)    Difficulty of Paying Living Expenses: Not very hard  Food Insecurity: No Food Insecurity (11/23/2023)   Hunger Vital Sign    Worried About Running Out of Food in the Last Year: Never true    Ran Out of Food in the Last Year: Never true  Transportation Needs: No Transportation Needs (11/23/2023)   PRAPARE - Administrator, Civil Service (Medical): No    Lack of Transportation (Non-Medical): No  Physical Activity: Insufficiently Active (11/23/2023)   Exercise Vital Sign    Days of Exercise per Week: 1 day    Minutes of Exercise per Session: 10 min  Stress: No Stress Concern Present (11/23/2023)   [employer] of Occupational Health - Occupational Stress Questionnaire    Feeling of Stress : Not at all  Social Connections: Unknown (11/23/2023)   Social Connection and Isolation Panel [NHANES]    Frequency of Communication with Friends and Family: More than three times a week    Frequency of Social Gatherings with Friends and Family: Twice a week    Attends Religious Services: Patient declined    Database administrator or Organizations: No    Attends Engineer, structural: Not on file    Marital Status: Widowed    Tobacco Counseling Counseling given: Yes    Clinical Intake:  Pre-visit preparation completed: Yes  Pain : No/denies pain     BMI - recorded: 36.49 Nutritional Status: BMI > 30  Obese Nutritional Risks: None Diabetes: No  Lab Results  Component Value Date   HGBA1C 4.8 04/20/2023   HGBA1C 4.8 10/20/2022   HGBA1C 4.6 (L) 05/09/2022     How often do you need to have someone help you when you read instructions, pamphlets, or other written materials from your doctor or  pharmacy?: 1 - Never  Interpreter Needed?: No  Information entered by :: Alia t/cma   Activities of Daily Living     12/27/2023    6:19 AM 08/18/2023    8:43 AM  In your present state of health, do you have any difficulty performing the following activities:  Hearing? 0 0  Vision? 0 0  Difficulty concentrating or making decisions? 0 0  Walking or climbing stairs? 1   Dressing or bathing? 0   Doing errands, shopping? 0   Preparing Food and eating ? N   Using the Toilet? N   In the past six months, have you accidently leaked urine? Y   Do you have problems with loss of bowel control? N   Managing your Medications? N   Managing your Finances? N   Housekeeping or managing your Housekeeping? N     Patient Care Team: Albertha Huger, FNP as PCP - General (Family Medicine) Eilleen Grates, MD as PCP - Cardiology (Cardiology) Delman Ferns, NP (Gastroenterology) Eilleen Grates, MD as Consulting Physician (Cardiology) Alexia Idler, OD (Optometry) Delilah Fend, [facility] as Pharmacist (Family Medicine) Tania Familia, NP as Nurse Practitioner (Cardiology) [facility], Julian Obey, MD as Attending Physician (Endocrinology) Ozell Blunt, MD as Consulting Physician (Gastroenterology) Lahoma Pigg, MD as Consulting Physician (Urology) Arlyce Berger, RN as [facility] Care Management Paulett Boros, MD as Consulting Physician (Hematology)  I have updated your Care Teams any recent Medical Services you may have received from other providers in the past year.     Assessment:    This is a routine wellness examination for [facility].  Hearing/Vision screen Hearing Screening - Comments:: Pt denies hearing dif  Vision Screening - Comments:: Pt denies vision dif. Only  wear readers/pt goes to [facility] in [place], [place]/last ov 20yrs ago. Suggest going to get eyes checked, pt agreed   Goals Addressed               This Visit's Progress     Patient Stated  (pt-stated)        Pt would like to get up and walk more       Depression Screen     12/28/2023    9:49 AM 11/24/2023    1:19 PM 04/20/2023   10:27 AM 12/27/2022   10:35 AM 12/02/2022    9:10 AM 11/21/2022    4:06 PM 11/04/2022    2:52 PM  PHQ 2/9 Scores  PHQ - 2 Score 0 0 0 0 0 0 0  PHQ- 9 Score 0 0 0 0 0 0     Fall Risk     12/27/2023    6:19 AM 11/24/2023    1:19 PM 04/20/2023   10:26 AM 12/27/2022   10:34 AM 12/26/2022   10:58 AM  Fall Risk   Falls in the past year? 0 0 0 0 0  Number falls in past yr:  0  0   Injury with Fall?  0  0 0  Risk for fall due to :  No Fall Risks  No Fall Risks   Follow up  Falls evaluation completed  Falls prevention discussed     MEDICARE RISK AT HOME:  Medicare Risk at Home Any stairs in or around the home?: (Patient-Rptd) No Home free of loose throw rugs in walkways, pet beds, electrical cords, etc?: (Patient-Rptd) Yes Adequate lighting in your home to reduce risk of falls?: (Patient-Rptd) Yes Life alert?: (Patient-Rptd) No Use of a cane, walker or w/c?: (Patient-Rptd) Yes Grab bars in the bathroom?: (Patient-Rptd) Yes Shower chair or bench in shower?: (Patient-Rptd) Yes Elevated toilet seat or a handicapped toilet?: (Patient-Rptd) Yes  TIMED UP AND GO:  Was the test performed?  no  Cognitive Function: 6CIT completed    08/01/2017   10:52 AM  MMSE - Mini Mental State Exam  Orientation to time 5  Orientation to Place 5  Registration 3  Attention/ Calculation 5  Recall 2  Language- name 2 objects 2  Language- repeat 1  Language- follow 3 step command 3  Language- read & follow direction 1  Write a sentence 1  Copy design 1  Total score 29        12/28/2023    9:50 AM 12/27/2022   10:36 AM 12/23/2021   11:26 AM 11/21/2019   10:44 AM  6CIT Screen  What Year? 0 points 0 points 0 points 0 points  What month? 0 points 0 points 3 points 0 points  What time? 0 points 0 points 0 points 0 points  Count back from 20 0 points 0 points 0 points  0 points  Months in reverse 0 points 0 points 2 points 0 points  Repeat phrase 0 points 0 points 2 points 0 points  Total Score 0 points 0 points 7 points 0 points    Immunizations Immunization History  Administered Date(s) Administered   Fluad Quad(high Dose 65+) 04/04/2019, 06/04/2020, 05/10/2021, 05/09/2022   Fluad Trivalent(High Dose 65+) 04/20/2023   Influenza, High Dose Seasonal PF 05/16/2016, 08/01/2017, 08/28/2018   Moderna SARS-COV2 Booster Vaccination 07/02/2020   Moderna Sars-Covid-2 Vaccination 09/18/2019, 10/16/2019   PNEUMOCOCCAL CONJUGATE-20 07/30/2021   Pneumococcal Conjugate-13 07/02/2020   Zoster Recombinant(Shingrix) 12/03/2019, 10/20/2020    Screening Tests  Health Maintenance  Topic Date Due   DTaP/Tdap/Td (1 - Tdap) Never done   COVID-19 Vaccine (3 - Moderna risk series) 01/12/2025 (Originally 07/30/2020)   INFLUENZA VACCINE  02/23/2024   DEXA SCAN  10/28/2024   Medicare Annual Wellness (AWV)  12/27/2024   Pneumonia Vaccine 31+ Years old  Completed   Zoster Vaccines- Shingrix  Completed   HPV VACCINES  Aged Out   Meningococcal B Vaccine  Aged Out    Health Maintenance  Health Maintenance Due  Topic Date Due   DTaP/Tdap/Td (1 - Tdap) Never done   Health Maintenance Items Addressed: See Nurse Notes at the end of this note  Additional Screening:  Vision Screening: Recommended annual ophthalmology exams for early detection of glaucoma and other disorders of the eye. Would you like a referral to an eye doctor? No    Dental Screening: Recommended annual dental exams for proper oral hygiene  Community Resource Referral / Chronic Care Management: CRR required this visit?  No   CCM required this visit?  No   Plan:    I have personally reviewed and noted the following in the patient's chart:   Medical and social history Use of alcohol , tobacco or illicit drugs  Current medications and supplements including opioid prescriptions. Patient is not  currently taking opioid prescriptions. Functional ability and status Nutritional status Physical activity Advanced directives List of other physicians Hospitalizations, surgeries, and ER visits in previous 12 months Vitals Screenings to include cognitive, depression, and falls Referrals and appointments  In addition, I have reviewed and discussed with patient certain preventive protocols, quality metrics, and best practice recommendations. A written personalized care plan for preventive services as well as general preventive health recommendations were provided to patient.   Michaelle Adolphus, CMA   12/28/2023   After Visit Summary: (MyChart) Due to this being a telephonic visit, the after visit summary with patients personalized plan was offered to patient via MyChart   Notes: Pt is aware and encouraged to get DTAP at next office visit w/privider. Pt voiced understanding.

## 2024-01-12 ENCOUNTER — Inpatient Hospital Stay

## 2024-01-12 DIAGNOSIS — R0602 Shortness of breath: Secondary | ICD-10-CM | POA: Diagnosis not present

## 2024-01-12 DIAGNOSIS — D649 Anemia, unspecified: Secondary | ICD-10-CM

## 2024-01-12 DIAGNOSIS — R0789 Other chest pain: Secondary | ICD-10-CM | POA: Diagnosis not present

## 2024-01-12 DIAGNOSIS — I2699 Other pulmonary embolism without acute cor pulmonale: Secondary | ICD-10-CM | POA: Diagnosis not present

## 2024-01-12 DIAGNOSIS — D500 Iron deficiency anemia secondary to blood loss (chronic): Secondary | ICD-10-CM | POA: Insufficient documentation

## 2024-01-12 DIAGNOSIS — I4891 Unspecified atrial fibrillation: Secondary | ICD-10-CM | POA: Diagnosis not present

## 2024-01-12 DIAGNOSIS — R0902 Hypoxemia: Secondary | ICD-10-CM | POA: Diagnosis not present

## 2024-01-12 LAB — CBC WITH DIFFERENTIAL/PLATELET
Abs Immature Granulocytes: 0.02 10*3/uL (ref 0.00–0.07)
Basophils Absolute: 0 10*3/uL (ref 0.0–0.1)
Basophils Relative: 1 %
Eosinophils Absolute: 0.1 10*3/uL (ref 0.0–0.5)
Eosinophils Relative: 2 %
HCT: 37.2 % (ref 36.0–46.0)
Hemoglobin: 12.1 g/dL (ref 12.0–15.0)
Immature Granulocytes: 0 %
Lymphocytes Relative: 15 %
Lymphs Abs: 1 10*3/uL (ref 0.7–4.0)
MCH: 31.1 pg (ref 26.0–34.0)
MCHC: 32.5 g/dL (ref 30.0–36.0)
MCV: 95.6 fL (ref 80.0–100.0)
Monocytes Absolute: 0.8 10*3/uL (ref 0.1–1.0)
Monocytes Relative: 12 %
Neutro Abs: 4.7 10*3/uL (ref 1.7–7.7)
Neutrophils Relative %: 70 %
Platelets: 172 10*3/uL (ref 150–400)
RBC: 3.89 MIL/uL (ref 3.87–5.11)
RDW: 13.8 % (ref 11.5–15.5)
WBC: 6.7 10*3/uL (ref 4.0–10.5)
nRBC: 0 % (ref 0.0–0.2)

## 2024-01-12 LAB — COMPREHENSIVE METABOLIC PANEL WITH GFR
ALT: 12 U/L (ref 0–44)
AST: 13 U/L — ABNORMAL LOW (ref 15–41)
Albumin: 3.6 g/dL (ref 3.5–5.0)
Alkaline Phosphatase: 98 U/L (ref 38–126)
Anion gap: 9 (ref 5–15)
BUN: 40 mg/dL — ABNORMAL HIGH (ref 8–23)
CO2: 21 mmol/L — ABNORMAL LOW (ref 22–32)
Calcium: 11 mg/dL — ABNORMAL HIGH (ref 8.9–10.3)
Chloride: 110 mmol/L (ref 98–111)
Creatinine, Ser: 1.24 mg/dL — ABNORMAL HIGH (ref 0.44–1.00)
GFR, Estimated: 43 mL/min — ABNORMAL LOW (ref >60)
Glucose, Bld: 114 mg/dL — ABNORMAL HIGH (ref 70–99)
Potassium: 3.9 mmol/L (ref 3.5–5.1)
Sodium: 140 mmol/L (ref 135–145)
Total Bilirubin: 0.4 mg/dL (ref 0.0–1.2)
Total Protein: 6.3 g/dL — ABNORMAL LOW (ref 6.5–8.1)

## 2024-01-12 LAB — IRON AND TIBC
Iron: 27 ug/dL — ABNORMAL LOW (ref 28–170)
Saturation Ratios: 8 % — ABNORMAL LOW (ref 10.4–31.8)
TIBC: 336 ug/dL (ref 250–450)
UIBC: 309 ug/dL

## 2024-01-12 LAB — VITAMIN B12: Vitamin B-12: 254 pg/mL (ref 180–914)

## 2024-01-12 LAB — FERRITIN: Ferritin: 22 ng/mL (ref 11–307)

## 2024-01-12 LAB — VITAMIN D 25 HYDROXY (VIT D DEFICIENCY, FRACTURES): Vit D, 25-Hydroxy: 32.52 ng/mL (ref 30–100)

## 2024-01-14 ENCOUNTER — Other Ambulatory Visit: Payer: Self-pay

## 2024-01-14 ENCOUNTER — Emergency Department (HOSPITAL_BASED_OUTPATIENT_CLINIC_OR_DEPARTMENT_OTHER)

## 2024-01-14 ENCOUNTER — Inpatient Hospital Stay (HOSPITAL_BASED_OUTPATIENT_CLINIC_OR_DEPARTMENT_OTHER)
Admission: EM | Admit: 2024-01-14 | Discharge: 2024-01-19 | DRG: 175 | Disposition: A | Discharge status: Home / Self Care | Attending: Internal Medicine | Admitting: Internal Medicine

## 2024-01-14 ENCOUNTER — Encounter (HOSPITAL_BASED_OUTPATIENT_CLINIC_OR_DEPARTMENT_OTHER): Payer: Self-pay | Admitting: Emergency Medicine

## 2024-01-14 DIAGNOSIS — I130 Hypertensive heart and chronic kidney disease with heart failure and stage 1 through stage 4 chronic kidney disease, or unspecified chronic kidney disease: Secondary | ICD-10-CM | POA: Diagnosis present

## 2024-01-14 DIAGNOSIS — I4891 Unspecified atrial fibrillation: Secondary | ICD-10-CM | POA: Diagnosis present

## 2024-01-14 DIAGNOSIS — I10 Essential (primary) hypertension: Secondary | ICD-10-CM | POA: Diagnosis not present

## 2024-01-14 DIAGNOSIS — N179 Acute kidney failure, unspecified: Secondary | ICD-10-CM | POA: Diagnosis present

## 2024-01-14 DIAGNOSIS — Q233 Congenital mitral insufficiency: Secondary | ICD-10-CM | POA: Diagnosis not present

## 2024-01-14 DIAGNOSIS — J90 Pleural effusion, not elsewhere classified: Secondary | ICD-10-CM | POA: Diagnosis not present

## 2024-01-14 DIAGNOSIS — J9601 Acute respiratory failure with hypoxia: Secondary | ICD-10-CM | POA: Diagnosis not present

## 2024-01-14 DIAGNOSIS — R0789 Other chest pain: Secondary | ICD-10-CM | POA: Diagnosis not present

## 2024-01-14 DIAGNOSIS — E785 Hyperlipidemia, unspecified: Secondary | ICD-10-CM | POA: Diagnosis not present

## 2024-01-14 DIAGNOSIS — Z90710 Acquired absence of both cervix and uterus: Secondary | ICD-10-CM

## 2024-01-14 DIAGNOSIS — Z96643 Presence of artificial hip joint, bilateral: Secondary | ICD-10-CM | POA: Diagnosis present

## 2024-01-14 DIAGNOSIS — Z95818 Presence of other cardiac implants and grafts: Secondary | ICD-10-CM | POA: Diagnosis not present

## 2024-01-14 DIAGNOSIS — N1831 Chronic kidney disease, stage 3a: Secondary | ICD-10-CM | POA: Diagnosis not present

## 2024-01-14 DIAGNOSIS — I700 Atherosclerosis of aorta: Secondary | ICD-10-CM | POA: Diagnosis present

## 2024-01-14 DIAGNOSIS — R0902 Hypoxemia: Secondary | ICD-10-CM | POA: Diagnosis not present

## 2024-01-14 DIAGNOSIS — I4819 Other persistent atrial fibrillation: Secondary | ICD-10-CM | POA: Diagnosis present

## 2024-01-14 DIAGNOSIS — Z8249 Family history of ischemic heart disease and other diseases of the circulatory system: Secondary | ICD-10-CM

## 2024-01-14 DIAGNOSIS — Z6836 Body mass index (BMI) 36.0-36.9, adult: Secondary | ICD-10-CM

## 2024-01-14 DIAGNOSIS — Z7901 Long term (current) use of anticoagulants: Secondary | ICD-10-CM

## 2024-01-14 DIAGNOSIS — E119 Type 2 diabetes mellitus without complications: Secondary | ICD-10-CM

## 2024-01-14 DIAGNOSIS — Z79899 Other long term (current) drug therapy: Secondary | ICD-10-CM

## 2024-01-14 DIAGNOSIS — I82411 Acute embolism and thrombosis of right femoral vein: Secondary | ICD-10-CM | POA: Diagnosis present

## 2024-01-14 DIAGNOSIS — D631 Anemia in chronic kidney disease: Secondary | ICD-10-CM | POA: Diagnosis present

## 2024-01-14 DIAGNOSIS — K31819 Angiodysplasia of stomach and duodenum without bleeding: Secondary | ICD-10-CM | POA: Diagnosis present

## 2024-01-14 DIAGNOSIS — R079 Chest pain, unspecified: Secondary | ICD-10-CM | POA: Diagnosis not present

## 2024-01-14 DIAGNOSIS — Z85828 Personal history of other malignant neoplasm of skin: Secondary | ICD-10-CM

## 2024-01-14 DIAGNOSIS — Z86711 Personal history of pulmonary embolism: Secondary | ICD-10-CM

## 2024-01-14 DIAGNOSIS — D649 Anemia, unspecified: Secondary | ICD-10-CM | POA: Diagnosis present

## 2024-01-14 DIAGNOSIS — E1122 Type 2 diabetes mellitus with diabetic chronic kidney disease: Secondary | ICD-10-CM | POA: Diagnosis present

## 2024-01-14 DIAGNOSIS — I152 Hypertension secondary to endocrine disorders: Secondary | ICD-10-CM | POA: Diagnosis present

## 2024-01-14 DIAGNOSIS — I2699 Other pulmonary embolism without acute cor pulmonale: Principal | ICD-10-CM | POA: Diagnosis present

## 2024-01-14 DIAGNOSIS — I480 Paroxysmal atrial fibrillation: Secondary | ICD-10-CM | POA: Diagnosis not present

## 2024-01-14 DIAGNOSIS — D500 Iron deficiency anemia secondary to blood loss (chronic): Secondary | ICD-10-CM | POA: Diagnosis present

## 2024-01-14 DIAGNOSIS — E039 Hypothyroidism, unspecified: Secondary | ICD-10-CM | POA: Diagnosis not present

## 2024-01-14 DIAGNOSIS — J9811 Atelectasis: Secondary | ICD-10-CM | POA: Diagnosis not present

## 2024-01-14 DIAGNOSIS — I2609 Other pulmonary embolism with acute cor pulmonale: Secondary | ICD-10-CM | POA: Diagnosis not present

## 2024-01-14 DIAGNOSIS — Z66 Do not resuscitate: Secondary | ICD-10-CM | POA: Diagnosis present

## 2024-01-14 DIAGNOSIS — K219 Gastro-esophageal reflux disease without esophagitis: Secondary | ICD-10-CM | POA: Diagnosis present

## 2024-01-14 DIAGNOSIS — M170 Bilateral primary osteoarthritis of knee: Secondary | ICD-10-CM | POA: Diagnosis present

## 2024-01-14 DIAGNOSIS — Z79890 Hormone replacement therapy (postmenopausal): Secondary | ICD-10-CM

## 2024-01-14 DIAGNOSIS — I5042 Chronic combined systolic (congestive) and diastolic (congestive) heart failure: Secondary | ICD-10-CM | POA: Diagnosis present

## 2024-01-14 DIAGNOSIS — I82413 Acute embolism and thrombosis of femoral vein, bilateral: Secondary | ICD-10-CM | POA: Diagnosis present

## 2024-01-14 DIAGNOSIS — I82432 Acute embolism and thrombosis of left popliteal vein: Secondary | ICD-10-CM | POA: Diagnosis present

## 2024-01-14 DIAGNOSIS — I2510 Atherosclerotic heart disease of native coronary artery without angina pectoris: Secondary | ICD-10-CM | POA: Diagnosis present

## 2024-01-14 DIAGNOSIS — Z87442 Personal history of urinary calculi: Secondary | ICD-10-CM

## 2024-01-14 DIAGNOSIS — I5021 Acute systolic (congestive) heart failure: Secondary | ICD-10-CM | POA: Diagnosis not present

## 2024-01-14 DIAGNOSIS — M19041 Primary osteoarthritis, right hand: Secondary | ICD-10-CM | POA: Diagnosis present

## 2024-01-14 DIAGNOSIS — Z86718 Personal history of other venous thrombosis and embolism: Secondary | ICD-10-CM

## 2024-01-14 DIAGNOSIS — Z800 Family history of malignant neoplasm of digestive organs: Secondary | ICD-10-CM

## 2024-01-14 DIAGNOSIS — I5043 Acute on chronic combined systolic (congestive) and diastolic (congestive) heart failure: Secondary | ICD-10-CM | POA: Diagnosis not present

## 2024-01-14 DIAGNOSIS — R072 Precordial pain: Principal | ICD-10-CM

## 2024-01-14 DIAGNOSIS — E1169 Type 2 diabetes mellitus with other specified complication: Secondary | ICD-10-CM | POA: Diagnosis present

## 2024-01-14 DIAGNOSIS — I482 Chronic atrial fibrillation, unspecified: Secondary | ICD-10-CM | POA: Diagnosis not present

## 2024-01-14 DIAGNOSIS — E890 Postprocedural hypothyroidism: Secondary | ICD-10-CM | POA: Diagnosis present

## 2024-01-14 DIAGNOSIS — Z8049 Family history of malignant neoplasm of other genital organs: Secondary | ICD-10-CM

## 2024-01-14 DIAGNOSIS — N1832 Chronic kidney disease, stage 3b: Secondary | ICD-10-CM

## 2024-01-14 DIAGNOSIS — M19042 Primary osteoarthritis, left hand: Secondary | ICD-10-CM | POA: Diagnosis present

## 2024-01-14 LAB — D-DIMER, QUANTITATIVE: D-Dimer, Quant: 6.73 ug{FEU}/mL — ABNORMAL HIGH (ref 0.00–0.50)

## 2024-01-14 LAB — TROPONIN I (HIGH SENSITIVITY)
Troponin I (High Sensitivity): 9 ng/L (ref <18)
Troponin I (High Sensitivity): 10 ng/L (ref <18)

## 2024-01-14 LAB — PHOSPHORUS: Phosphorus: 2.7 mg/dL (ref 2.5–4.6)

## 2024-01-14 LAB — URINALYSIS, ROUTINE W REFLEX MICROSCOPIC
Bacteria, UA: NONE SEEN
Bilirubin Urine: NEGATIVE
Glucose, UA: >1000 mg/dL — AB
Hgb urine dipstick: NEGATIVE
Ketones, ur: NEGATIVE mg/dL
Nitrite: NEGATIVE
Specific Gravity, Urine: 1.016 (ref 1.005–1.030)
pH: 5.5 (ref 5.0–8.0)

## 2024-01-14 LAB — CBC
HCT: 37.4 % (ref 36.0–46.0)
Hemoglobin: 12.1 g/dL (ref 12.0–15.0)
MCH: 30.7 pg (ref 26.0–34.0)
MCHC: 32.4 g/dL (ref 30.0–36.0)
MCV: 94.9 fL (ref 80.0–100.0)
Platelets: 156 10*3/uL (ref 150–400)
RBC: 3.94 MIL/uL (ref 3.87–5.11)
RDW: 13.9 % (ref 11.5–15.5)
WBC: 7.9 10*3/uL (ref 4.0–10.5)
nRBC: 0 % (ref 0.0–0.2)

## 2024-01-14 LAB — BASIC METABOLIC PANEL WITH GFR
Anion gap: 14 (ref 5–15)
BUN: 34 mg/dL — ABNORMAL HIGH (ref 8–23)
CO2: 22 mmol/L (ref 22–32)
Calcium: 11.8 mg/dL — ABNORMAL HIGH (ref 8.9–10.3)
Chloride: 106 mmol/L (ref 98–111)
Creatinine, Ser: 1.43 mg/dL — ABNORMAL HIGH (ref 0.44–1.00)
GFR, Estimated: 36 mL/min — ABNORMAL LOW (ref >60)
Glucose, Bld: 110 mg/dL — ABNORMAL HIGH (ref 70–99)
Potassium: 4.3 mmol/L (ref 3.5–5.1)
Sodium: 141 mmol/L (ref 135–145)

## 2024-01-14 LAB — TSH: TSH: 2.297 u[IU]/mL (ref 0.350–4.500)

## 2024-01-14 LAB — BRAIN NATRIURETIC PEPTIDE: B Natriuretic Peptide: 458.3 pg/mL — ABNORMAL HIGH (ref 0.0–100.0)

## 2024-01-14 LAB — TROPONIN T, HIGH SENSITIVITY: Troponin T High Sensitivity: <15 ng/L (ref <19)

## 2024-01-14 LAB — MAGNESIUM: Magnesium: 2.1 mg/dL (ref 1.7–2.4)

## 2024-01-14 MED ORDER — SODIUM CHLORIDE 0.9 % IV BOLUS
500.0000 mL | Freq: Once | INTRAVENOUS | Status: AC
  Administered 2024-01-14: 500 mL via INTRAVENOUS

## 2024-01-14 MED ORDER — METOPROLOL SUCCINATE ER 25 MG PO TB24: 25.0000 mg | ORAL_TABLET | Freq: Every day | ORAL | Status: DC

## 2024-01-14 MED ORDER — ATORVASTATIN CALCIUM 10 MG PO TABS
20.0000 mg | ORAL_TABLET | Freq: Every day | ORAL | Status: DC
  Administered 2024-01-15 – 2024-01-19 (×5): 20 mg via ORAL
  Filled 2024-01-14 (×5): qty 2

## 2024-01-14 MED ORDER — ONDANSETRON HCL 4 MG PO TABS
4.0000 mg | ORAL_TABLET | Freq: Four times a day (QID) | ORAL | Status: DC | PRN
Start: 2024-01-14 — End: 2024-01-19

## 2024-01-14 MED ORDER — LEVOTHYROXINE SODIUM 50 MCG PO TABS
50.0000 ug | ORAL_TABLET | Freq: Every day | ORAL | Status: DC
  Administered 2024-01-15 – 2024-01-19 (×5): 50 ug via ORAL
  Filled 2024-01-14 (×5): qty 1

## 2024-01-14 MED ORDER — SODIUM CHLORIDE 0.9 % IV SOLN: INTRAVENOUS | Status: DC

## 2024-01-14 MED ORDER — HEPARIN (PORCINE) 25000 UT/250ML-% IV SOLN
1400.0000 [IU]/h | INTRAVENOUS | Status: DC
  Administered 2024-01-14: 1300 [IU]/h via INTRAVENOUS
  Administered 2024-01-15 – 2024-01-16 (×2): 1400 [IU]/h via INTRAVENOUS
  Filled 2024-01-14 (×3): qty 250

## 2024-01-14 MED ORDER — METOPROLOL TARTRATE 12.5 MG HALF TABLET
12.5000 mg | ORAL_TABLET | Freq: Three times a day (TID) | ORAL | Status: DC
  Administered 2024-01-14 – 2024-01-15 (×2): 12.5 mg via ORAL
  Filled 2024-01-14 (×2): qty 1

## 2024-01-14 MED ORDER — FENTANYL CITRATE PF 50 MCG/ML IJ SOSY: 12.5000 ug | PREFILLED_SYRINGE | INTRAMUSCULAR | Status: DC | PRN

## 2024-01-14 MED ORDER — OXYBUTYNIN CHLORIDE ER 10 MG PO TB24
10.0000 mg | ORAL_TABLET | Freq: Every day | ORAL | Status: DC
  Administered 2024-01-14 – 2024-01-18 (×5): 10 mg via ORAL
  Filled 2024-01-14 (×6): qty 1

## 2024-01-14 MED ORDER — ONDANSETRON HCL 4 MG/2ML IJ SOLN: 4.0000 mg | Freq: Four times a day (QID) | INTRAMUSCULAR | Status: DC | PRN

## 2024-01-14 MED ORDER — ACETAMINOPHEN 325 MG PO TABS
650.0000 mg | ORAL_TABLET | Freq: Four times a day (QID) | ORAL | Status: DC | PRN
Start: 2024-01-14 — End: 2024-01-19
  Administered 2024-01-15 – 2024-01-18 (×6): 650 mg via ORAL
  Filled 2024-01-14 (×6): qty 2

## 2024-01-14 MED ORDER — IOHEXOL 350 MG/ML SOLN
75.0000 mL | Freq: Once | INTRAVENOUS | Status: AC | PRN
  Administered 2024-01-14: 50 mL via INTRAVENOUS

## 2024-01-14 MED ORDER — ORAL CARE MOUTH RINSE: 15.0000 mL | OROMUCOSAL | Status: DC | PRN

## 2024-01-14 MED ORDER — HYDROCODONE-ACETAMINOPHEN 5-325 MG PO TABS
1.0000 | ORAL_TABLET | ORAL | Status: DC | PRN
  Administered 2024-01-14 – 2024-01-15 (×3): 1 via ORAL
  Filled 2024-01-14 (×3): qty 1

## 2024-01-14 MED ORDER — PANTOPRAZOLE SODIUM 40 MG PO TBEC: 40.0000 mg | DELAYED_RELEASE_TABLET | Freq: Two times a day (BID) | ORAL | Status: DC

## 2024-01-14 MED ORDER — PANTOPRAZOLE SODIUM 40 MG PO TBEC
40.0000 mg | DELAYED_RELEASE_TABLET | Freq: Two times a day (BID) | ORAL | Status: DC
  Administered 2024-01-14 – 2024-01-19 (×10): 40 mg via ORAL
  Filled 2024-01-14 (×10): qty 1

## 2024-01-14 MED ORDER — HEPARIN BOLUS VIA INFUSION
4500.0000 [IU] | Freq: Once | INTRAVENOUS | Status: AC
  Administered 2024-01-14: 4500 [IU] via INTRAVENOUS

## 2024-01-14 MED ORDER — ACETAMINOPHEN 650 MG RE SUPP: 650.0000 mg | Freq: Four times a day (QID) | RECTAL | Status: DC | PRN

## 2024-01-14 NOTE — ED Notes (Signed)
 Called Karen at [facility] for transport

## 2024-01-14 NOTE — ED Provider Notes (Addendum)
 [place] EMERGENCY DEPARTMENT AT [facility] Provider Note   CSN: [id number] Arrival date & time: 01/14/24  9041     Patient presents with: No chief complaint on file.   [name] [name] is a 83 y.o. female.   Patient last evening probably around 2000 developed right anterior sharp chest discomfort.  Hurts more to take a deep breath feels a bit short of breath with it.  Back in 2020 patient did have a pulmonary embolism and was on that side.  Patient does have a history of atrial fibrillation but is no longer on Eliquis  or Plavix  because she was having GI bleed problems.  No fall or injury.  EKG here shows atrial fibs with a little bit of RVR.  Temp is 98 pulse 70 heart rate 113 respirations 25 the patient was on 2 L of oxygen  while in the room.  Satting about 96%.  Off oxygen  she drops down to 89%.  So there is an oxygen  requirement.  Patient denies any leg swelling.  Patient did say she did have some exertional shortness of breath while walking around today.  Patient is on Toprol  XL.  Past medical history sniffer hypertension hypothyroidism pulmonary embolism mention.  History of diabetes history of atrial fibrillation does have a watchman left atrial area.  And has a history of chronic kidney disease past surgical history significant for abdominal hysterectomy.  And parathyroidectomy.  Patient is never used tobacco products.       Prior to Admission medications   Medication Sig Start Date End Date Taking? Authorizing Provider  acetaminophen  (TYLENOL ) 325 MG tablet Take 2 tablets (650 mg total) by mouth every 6 (six) hours as needed for mild pain or headache (or Fever >/= 101). 04/04/19   Pearlean Manus, MD  allopurinol  (ZYLOPRIM ) 100 MG tablet Take 1 tablet (100 mg total) by mouth daily. 11/24/23   Joesph Annabella HERO, FNP  amLODipine  (NORVASC ) 5 MG tablet Take 1 tablet (5 mg total) by mouth daily. 04/05/23   Lavona Agent, MD  atorvastatin  (LIPITOR) 20 MG tablet Take 1 tablet  (20 mg total) by mouth daily. 11/24/23   Joesph Annabella HERO, FNP  cetirizine  (ZYRTEC ) 10 MG tablet Take 10 mg by mouth daily.    [provider]  dapagliflozin  propanediol (FARXIGA ) 10 MG TABS tablet Take 1 tablet (10 mg total) by mouth daily. 09/22/23   Joesph Annabella HERO, FNP  ferrous sulfate  325 (65 FE) MG EC tablet Take 325 mg by mouth daily.    [provider]  furosemide  (LASIX ) 20 MG tablet Take 1 tablet by mouth once daily 09/14/23   Lavona Agent, MD  [facility] CRANBERRY EXTRACT PO Take 1 tablet by mouth in the morning.    [provider]  levothyroxine  (SYNTHROID ) 50 MCG tablet Take 1 tablet (50 mcg total) by mouth daily before breakfast. 11/24/23   Joesph Annabella HERO, FNP  losartan  (COZAAR ) 25 MG tablet Take 1 tablet (25 mg total) by mouth daily. 11/24/23   Joesph Annabella HERO, FNP  metoprolol  succinate (TOPROL  XL) 25 MG 24 hr tablet Take 1 tablet (25 mg total) by mouth daily. 09/26/23   Sebastian Lamarr SAUNDERS, PA-C  oxybutynin  (DITROPAN -XL) 10 MG 24 hr tablet Take 1 tablet (10 mg total) by mouth at bedtime. 11/24/23   Joesph Annabella HERO, FNP  pantoprazole  (PROTONIX ) 40 MG tablet Take 1 tablet (40 mg total) by mouth 2 (two) times daily before a meal. 11/24/23   Joesph Annabella HERO, FNP  Allergies: Patient has no known allergies.    Review of Systems  Constitutional:  Negative for chills and fever.  HENT:  Negative for ear pain and sore throat.   Eyes:  Negative for pain and visual disturbance.  Respiratory:  Positive for shortness of breath. Negative for cough.   Cardiovascular:  Positive for chest pain. Negative for palpitations.  Gastrointestinal:  Negative for abdominal pain and vomiting.  Genitourinary:  Negative for dysuria and hematuria.  Musculoskeletal:  Negative for arthralgias and back pain.  Skin:  Negative for color change and rash.  Neurological:  Negative for seizures and syncope.  All other systems reviewed and are negative.   Updated Vital Signs BP (!) 143/96    Pulse (!) 108   Temp 98.2 F (36.8 C)   Resp (!) 31   Wt 90.7 kg   SpO2 94%   BMI 35.43 kg/m   Physical Exam Vitals and nursing note reviewed.  Constitutional:      General: She is not in acute distress.    Appearance: Normal appearance. She is well-developed.  HENT:     Head: Normocephalic and atraumatic.   Eyes:     Conjunctiva/sclera: Conjunctivae normal.    Cardiovascular:     Rate and Rhythm: Tachycardia present. Rhythm irregular.     Heart sounds: No murmur heard. Pulmonary:     Effort: Pulmonary effort is normal. No respiratory distress.     Breath sounds: Normal breath sounds.  Abdominal:     Palpations: Abdomen is soft.     Tenderness: There is no abdominal tenderness.   Musculoskeletal:        General: No swelling.     Cervical back: Neck supple.   Skin:    General: Skin is warm and dry.     Capillary Refill: Capillary refill takes less than 2 seconds.   Neurological:     Mental Status: She is alert.   Psychiatric:        Mood and Affect: Mood normal.     (all labs ordered are listed, but only abnormal results are displayed) Labs Reviewed  URINALYSIS, ROUTINE W REFLEX MICROSCOPIC - Abnormal; Notable for the following components:      Result Value   Glucose, UA >1,000 (*)    Protein, ur TRACE (*)    Leukocytes,Ua MODERATE (*)    Non Squamous Epithelial 0-5 (*)    All other components within normal limits  BASIC METABOLIC PANEL WITH GFR - Abnormal; Notable for the following components:   Glucose, Bld 110 (*)    BUN 34 (*)    Creatinine, Ser 1.43 (*)    Calcium  11.8 (*)    GFR, Estimated 36 (*)    All other components within normal limits  D-DIMER, QUANTITATIVE - Abnormal; Notable for the following components:   D-Dimer, Quant 6.73 (*)    All other components within normal limits  URINE CULTURE  CBC  TROPONIN T, HIGH SENSITIVITY  TROPONIN T, HIGH SENSITIVITY    EKG: None  Radiology: [facility] Chest Port 1 View Result Date:  01/14/2024 CLINICAL DATA:  RIGHT anterior chest pain. EXAM: PORTABLE CHEST 1 VIEW COMPARISON:  None Available. FINDINGS: The heart size and mediastinal contours are within normal limits. Both lungs are clear. The visualized skeletal structures are unremarkable. Atrial appendage closure device noted IMPRESSION: Insert no acute Electronically Signed   By: Jackquline Boxer M.D.   On: 01/14/2024 14:14     Procedures   Medications Ordered in the ED  sodium chloride  0.9 % bolus 500 mL (0 mLs Intravenous Stopped 01/14/24 1436)                                    Medical Decision Making Amount and/or Complexity of Data Reviewed Labs: ordered. Radiology: ordered.  Risk Decision regarding hospitalization.   EKG consistent with atrial fibs with rate around 112.  Basic metabolic panel GFR 36 for creatinine 1.43.  Patient's GFR's are normally in the low 40s.  CBC white count 7.9 hemoglobin 12.1 platelets 156.  Initial troponin was less than 15 will need delta troponin.  Urinalysis did have moderate leukocytes 21-50 white blood cells no bacteria seen and nitrite was negative.  Could be contamination.  Will get chest x-ray will need delta troponin.  Will get D-dimer.  Because not ideal renal function for CT angio to rule out PE.  However patient does have a hypoxia issue.  So will probably require admission for that because she does not have oxygen  at home.  If D-dimer is positive will see if they will do CTA low-dose.  If not patient will need VQ scan.  D-dimer is markedly elevated.  D-dimer is 6.73.  Patient's initial troponin was less than 15 which is reassuring.  Will see if we can do the CT angio here.  Patient has had GI bleed problems in the past with anticoagulation.  If unable to the CT scan here due to her renal function she will need to be admitted for VQ scan  Final diagnoses:  Precordial pain  Atrial fibrillation with RVR [facility])  Hypoxia    ED Discharge Orders     None           Geraldene Hamilton, MD 01/14/24 8490    Geraldene Hamilton, MD 01/14/24 1510

## 2024-01-14 NOTE — H&P (Addendum)
 [name] [name] FMW:[id number] DOB: 05/09/1941 DOA: 01/14/2024     PCP: Joesph Annabella HERO, FNP   Outpatient Specialists:   CARDS:  Dr. Lynwood Schilling, MD    Oncology    Geofm Delon BRAVO, NP   GI Dr.  Gwen, ) Shirlean Therisa ORN, NP Rosalie Kitchens, MD    Patient arrived to ER on 01/14/24 at [phone] Referred by Attending No att. providers found   Patient coming from:    home Lives   With family      Chief Complaint: Right-sided chest pain   HPI: [name] [name] is a 83 y.o. female with medical history significant of A-fib status post watchman, iron deficiency anemia, history of PE, HFimpEF, mitral valve disease, HTN, CKD stage IIIa,  GI bleeding, aortic atherosclerosis     Presented with   right-sided chest pain and shortness of breath Started to have sudden onset of sharp right-sided chest pain intermittent worse with deep breaths reminiscent of prior history of PE.  She used to be on anticoagulation but this had to be stopped in January.  Patient has known history of A-fib status post Watchman procedure November 2024 At baseline she is able to move around but not very active.  Denies any history of malignancy. Reports her legs have been swelling in particular left worse than right. [place] recent travel. Reports associated shortness of breath.  At baseline not on oxygen  At [facility] EKG showed A-fib with RVR on room air satting 89% started on O2 and advanced to 4 L. Reports in the past she was diabetic but has not required any of her medications for some time.    Denies significant ETOH intake   Does not smoke       Regarding pertinent Chronic problems:    Hyperlipidemia -  on statins Lipitor (atorvastatin )  Lipid Panel     Component Value Date/Time   CHOL 154 04/20/2023 1028   TRIG 182 (H) 04/20/2023 1028   HDL 44 04/20/2023 1028   CHOLHDL 3.5 04/20/2023 1028   CHOLHDL 3.7 02/07/2021 0528   VLDL 13 02/07/2021 0528   LDLCALC 79 04/20/2023 1028   LABVLDL 31  04/20/2023 1028     HTN on Norvasc , cozaar , toprol    chronic CHF systolic  - on lasix  last echo  Recent Results (from the past 56199 hours)  ECHO TEE   Collection Time: 08/18/23 10:10 AM  Result Value   Est EF 50 - 55%   Narrative      TRANSESOPHOGEAL ECHO REPORT         IMPRESSIONS    1. Left ventricular ejection fraction, by estimation, is 50 to 55%. The left ventricle has low normal function. The left ventricle has no regional wall motion abnormalities.  2. Right ventricular systolic function is normal. The right ventricular size is normal. There is normal pulmonary artery systolic pressure. The estimated right ventricular systolic pressure is 22.4 mmHg.  3. There is a well seated Watchman device in the left atrial appendage with excellent apposition and no evidence of leak. Left atrial size was moderately dilated. No left atrial/left atrial appendage thrombus was detected.  4. The mitral valve is normal in structure. No evidence of mitral valve regurgitation. No evidence of mitral stenosis.  5. The aortic valve is normal in structure. Aortic valve regurgitation is not visualized. No aortic stenosis is present.  6. The inferior vena cava is normal in size with greater than 50% respiratory variability, suggesting right atrial pressure of  3 mmHg.  7. Rhythm strip during this exam demonstrates atrial fibrillation. There is rapid ventricular response (max 151 bpm, average 115 bpm).        Gout - on Allopurinol       DM 2 - no-longer an active issue Lab Results  Component Value Date   HGBA1C 4.8 04/20/2023      Hypothyroidism:   Lab Results  Component Value Date   TSH 2.650 04/20/2023   T4TOTAL 8.5 06/04/2020   on synthroid       A. Fib -   atrial fibrillation CHA2DS2 vas score    8     Not on anticoagulation secondary to   recurrent bleeding         -  Rate control:  Currently controlled with  Toprolol,       Hx of DVT/PE   off anticoagulation     CKD stage IIIb    baseline Cr 1.3 Estimated Creatinine Clearance: 32.8 mL/min (A) (by C-G formula based on SCr of 1.43 mg/dL (H)).  Lab Results  Component Value Date   CREATININE 1.43 (H) 01/14/2024   CREATININE 1.24 (H) 01/12/2024   CREATININE 1.34 (H) 09/29/2023   Lab Results  Component Value Date   NA 141 01/14/2024   CL 106 01/14/2024   K 4.3 01/14/2024   CO2 22 01/14/2024   BUN 34 (H) 01/14/2024   CREATININE 1.43 (H) 01/14/2024   GFRNONAA 36 (L) 01/14/2024   CALCIUM  11.8 (H) 01/14/2024   PHOS 3.0 12/30/2020   ALBUMIN  3.6 01/12/2024   GLUCOSE 110 (H) 01/14/2024    Chronic anemia - baseline hg Hemoglobin & Hematocrit  Recent Labs    09/29/23 1029 01/12/24 1100 01/14/24 1116  HGB 11.2* 12.1 12.1   Iron/TIBC/Ferritin/ %Sat    Component Value Date/Time   IRON 27 (L) 01/12/2024 1101   IRON 360 (HH) 02/24/2021 1144   TIBC 336 01/12/2024 1101   TIBC <377 02/24/2021 1144   FERRITIN 22 01/12/2024 1101   FERRITIN 41 02/24/2021 1144   IRONPCTSAT 8 (L) 01/12/2024 1101   IRONPCTSAT >95 (HH) 02/24/2021 1144      While in ER: Clinical Course as of 01/14/24 2013  Sun Jan 14, 2024  1545 Received signout pending CTA.  CTA is completed and I personally reviewed images and on my independent interpretation it does appear that she has a large right-sided PE segmental and subsegmental.  Will start heparin  and plan for admission.  [TY]  1553 Radiology called and confirmed PE. Does have bilateral PE, but right greater than left. No evidence of right heart strain.  [TY]  1554 CT Angio Chest PE W/Cm &/Or Wo Cm MPRESSION: 1. Bilateral pulmonary emboli, moderate volume. No evidence of right heart strain. 2. Small left pleural effusion and trace right pleural effusion with atelectasis. 3. Distended pulmonary trunk suggesting underlying pulmonary artery hypertension. 4. Cardiomegaly with coronary artery calcifications. 5. Aortic atherosclerosis.   Electronically Signed   By: Leita Birmingham M.D.    On: 01/14/2024 15:49   [TY]    Clinical Course User Index [TY] Neysa Caron PARAS, DO         Lab Orders         Urine Culture         Urinalysis, Routine w reflex microscopic -Urine, Clean Catch         Basic metabolic panel         CBC         D-dimer, quantitative  Heparin  level (unfractionated)         Heparin  level (unfractionated)        CXR -  NON acute    CTA chest -   PE,  1. Bilateral pulmonary emboli, moderate volume. No evidence of right heart strain. 2. Small left pleural effusion and trace right pleural effusion with atelectasis. 3. Distended pulmonary trunk suggesting underlying pulmonary artery hypertension. 4. Cardiomegaly with coronary artery calcifications. 5. Aortic atherosclerosis.   Following Medications were ordered in ER: Medications  heparin  ADULT infusion 100 units/mL (25000 units/250mL) (1,300 Units/hr Intravenous Infusion Verify 01/14/24 2000)  sodium chloride  0.9 % bolus 500 mL (0 mLs Intravenous Stopped 01/14/24 1436)  iohexol  (OMNIPAQUE ) 350 MG/ML injection 75 mL (50 mLs Intravenous Contrast Given 01/14/24 1523)  heparin  bolus via infusion 4,500 Units (4,500 Units Intravenous Bolus from Bag 01/14/24 1616)        ED Triage Vitals  Encounter Vitals Group     BP 01/14/24 1017 134/82     Girls Systolic BP Percentile --      Girls Diastolic BP Percentile --      Boys Systolic BP Percentile --      Boys Diastolic BP Percentile --      Pulse Rate 01/14/24 1017 70     Resp 01/14/24 1017 (!) 25     Temp 01/14/24 1017 98 F (36.7 C)     Temp Source 01/14/24 1915 Oral     SpO2 01/14/24 1017 96 %     Weight 01/14/24 1015 200 lb (90.7 kg)     Height 01/14/24 1602 5' 3 (1.6 m)     Head Circumference --      Peak Flow --      Pain Score 01/14/24 1015 0     Pain Loc --      Pain Education --      Exclude from Growth Chart --   UFJK(75)@     _________________________________________ Significant initial  Findings: Abnormal Labs Reviewed   URINALYSIS, ROUTINE W REFLEX MICROSCOPIC - Abnormal; Notable for the following components:      Result Value   Glucose, UA >1,000 (*)    Protein, ur TRACE (*)    Leukocytes,Ua MODERATE (*)    Non Squamous Epithelial 0-5 (*)    All other components within normal limits  BASIC METABOLIC PANEL WITH GFR - Abnormal; Notable for the following components:   Glucose, Bld 110 (*)    BUN 34 (*)    Creatinine, Ser 1.43 (*)    Calcium  11.8 (*)    GFR, Estimated 36 (*)    All other components within normal limits  D-DIMER, QUANTITATIVE - Abnormal; Notable for the following components:   D-Dimer, Quant 6.73 (*)    All other components within normal limits      _________________________ Troponin  ordered    ECG: Ordered Personally reviewed and interpreted by me showing: HR : 112 Rhythm: Atrial fibrillation Borderline left axis deviation Anterior infarct, old Nonspecific repol abnormality, lateral leads QTC 451  BNP (last 3 results) No results for input(s): BNP in the last 8760 hours.    Recent Labs    01/12/24 1101 01/14/24 1116  DDIMER  --  6.73*  FERRITIN 22  --   ____________________ This patient meets SIRS Criteria and may be septic.  The recent clinical data is shown below. Vitals:   01/14/24 1805 01/14/24 1915 01/14/24 1935 01/14/24 2000  BP:  (!) 142/94 (!) 133/107 109/72  Pulse:  ROLLEN)  125 (!) 114 (!) 108  Resp: (!) 27 (!) 28 (!) 32 (!) 27  Temp:  99 F (37.2 C)    TempSrc:  Oral    SpO2:  94% 93% 92%  Weight:  92.9 kg    Height:  5' 3 (1.6 m)       WBC     Component Value Date/Time   WBC 7.9 01/14/2024 1116   LYMPHSABS 1.0 01/12/2024 1100   LYMPHSABS 0.9 12/02/2022 0957   MONOABS 0.8 01/12/2024 1100   EOSABS 0.1 01/12/2024 1100   EOSABS 0.1 12/02/2022 0957   BASOSABS 0.0 01/12/2024 1100   BASOSABS 0.0 12/02/2022 0957    UA  no evidence of UTI     Urine analysis:    Component Value Date/Time   COLORURINE YELLOW 01/14/2024 1018   APPEARANCEUR  CLEAR 01/14/2024 1018   APPEARANCEUR Clear 11/24/2023 1336   LABSPEC 1.016 01/14/2024 1018   PHURINE 5.5 01/14/2024 1018   GLUCOSEU >1,000 (A) 01/14/2024 1018   HGBUR NEGATIVE 01/14/2024 1018   BILIRUBINUR NEGATIVE 01/14/2024 1018   BILIRUBINUR Negative 11/24/2023 1336   KETONESUR NEGATIVE 01/14/2024 1018   PROTEINUR TRACE (A) 01/14/2024 1018   UROBILINOGEN 0.2 10/22/2013 1127   NITRITE NEGATIVE 01/14/2024 1018   LEUKOCYTESUR MODERATE (A) 01/14/2024 1018    Results for orders placed or performed in visit on 12/22/23  Urine Culture     Status: None   Collection Time: 12/22/23 10:51 AM   Specimen: Urine   UR  Result Value Ref Range Status   Urine Culture, Routine Final report  Final   Organism ID, Bacteria Comment  Final    Comment: Mixed urogenital flora 10,000-25,000 colony forming units per mL   ________________________________________________________________  Recent Labs  Lab 01/12/24 1100 01/14/24 1116  NA 140 141  K 3.9 4.3  CO2 21* 22  GLUCOSE 114* 110*  BUN 40* 34*  CREATININE 1.24* 1.43*  CALCIUM  11.0* 11.8*    Cr   Up from baseline see below Lab Results  Component Value Date   CREATININE 1.43 (H) 01/14/2024   CREATININE 1.24 (H) 01/12/2024   CREATININE 1.34 (H) 09/29/2023    Recent Labs  Lab 01/12/24 1100  AST 13*  ALT 12  ALKPHOS 98  BILITOT 0.4  PROT 6.3*  ALBUMIN  3.6   Lab Results  Component Value Date   CALCIUM  11.8 (H) 01/14/2024   PHOS 3.0 12/30/2020   Plt: Lab Results  Component Value Date   PLT 156 01/14/2024     Recent Labs  Lab 01/12/24 1100 01/14/24 1116  WBC 6.7 7.9  NEUTROABS 4.7  --   HGB 12.1 12.1  HCT 37.2 37.4  MCV 95.6 94.9  PLT 172 156    HG/HCT  stable,      Component Value Date/Time   HGB 12.1 01/14/2024 1116   HGB 11.6 07/28/2023 1110   HCT 37.4 01/14/2024 1116   HCT 36.7 07/28/2023 1110   MCV 94.9 01/14/2024 1116   MCV 98 (H) 07/28/2023 1110    _______________________________________________ Hospitalist was called for admission for moderate PE Atrial fibrillation with RVR (HCC)  The following Work up has been ordered so far:  Orders Placed This Encounter  Procedures   Urine Culture   DG Chest Port 1 View   CT Angio Chest PE W/Cm &/Or Wo Cm   Urinalysis, Routine w reflex microscopic -Urine, Clean Catch   Basic metabolic panel   CBC   D-dimer, quantitative   Heparin  level (unfractionated)  Heparin  level (unfractionated)   Maintain IV access, Saline Lock IV (if fever)   ED Cardiac monitoring   Cardiac Monitoring - Continuous Indefinite   heparin  per pharmacy consult   Consult to hospitalist   ED EKG   EKG   EKG   Place in observation (patient's expected length of stay will be less than 2 midnights)     OTHER Significant initial  Findings:  labs showing:   DM  labs:  HbA1C: Recent Labs    04/20/23 1026  HGBA1C 4.8       CBG (last 3)  No results for input(s): GLUCAP in the last 72 hours.        Cultures:    Component Value Date/Time   SDES  02/10/2022 0220    URINE, CLEAN CATCH Performed at [facility], [address], [place], [place] [address]    [facility]  02/10/2022 0220    NONE Performed at [facility], [address], [place], [place] [address]    CULT  02/10/2022 0220    NO GROWTH Performed at [facility], [address] [place], [place] [address]    REPTSTATUS 02/11/2022 FINAL 02/10/2022 0220     Radiological Exams on Admission: CT Angio Chest PE W/Cm &/Or Wo Cm Addendum Date: 01/14/2024 ADDENDUM REPORT: 01/14/2024 15:54 ADDENDUM: Critical Value/emergent results were called by telephone at the time of interpretation on 01/14/2024 at 3:53 pm to provider Dr. Neysa, who verbally acknowledged these results. Electronically Signed   By: Leita Birmingham M.D.   On: 01/14/2024 15:54   Result Date: 01/14/2024 CLINICAL DATA:  Pulmonary embolism suspected, low to intermediate  probability. Positive D-dimer. EXAM: CT ANGIOGRAPHY CHEST WITH CONTRAST TECHNIQUE: Multidetector CT imaging of the chest was performed using the standard protocol during bolus administration of intravenous contrast. Multiplanar CT image reconstructions and MIPs were obtained to evaluate the vascular anatomy. RADIATION DOSE REDUCTION: This exam was performed according to the departmental dose-optimization program which includes automated exposure control, adjustment of the mA and/or kV according to patient size and/or use of iterative reconstruction technique. CONTRAST:  50mL OMNIPAQUE  IOHEXOL  350 MG/ML SOLN COMPARISON:  04/03/2019. FINDINGS: Cardiovascular: The heart is enlarged and there is a trace pericardial effusion. Scattered coronary artery calcifications are present. An occlusion device is present in the left atrial appendage. There is atherosclerotic calcification of the aorta without evidence of aneurysm. The pulmonary trunk is distended suggesting underlying pulmonary artery hypertension. Pulmonary artery filling defects are present in the right pulmonary artery extending into the lobar, segmental, of subsegmental arteries in the right upper, middle, and lower lobes. There are segmental and subsegmental pulmonary emboli in the left upper and lower lobes. There is no evidence of right heart strain. Mediastinum/Nodes: No mediastinal or axillary lymphadenopathy. A nonspecific prominent lymph nodes present at the right hilum. There is a stable hypodense lesion in the left lobe of the thyroid  gland measuring 1.9 cm. The trachea and esophagus are within normal limits. Lungs/Pleura: There is a small pleural effusion on the right a trace pleural effusion on the left. No pneumothorax is seen. Atelectasis is present bilaterally. Upper Abdomen: No acute abnormality. Cystic structures are present in the left upper quadrant, which may be renal in origin and unchanged from the prior exam. Musculoskeletal: Degenerative  changes are present in the thoracic spine. No acute osseous abnormality is seen. Review of the MIP images confirms the above findings. IMPRESSION: 1. Bilateral pulmonary emboli, moderate volume. No evidence of right heart strain. 2. Small left  pleural effusion and trace right pleural effusion with atelectasis. 3. Distended pulmonary trunk suggesting underlying pulmonary artery hypertension. 4. Cardiomegaly with coronary artery calcifications. 5. Aortic atherosclerosis. Electronically Signed: By: Leita Birmingham M.D. On: 01/14/2024 15:49   DG Chest Port 1 View Result Date: 01/14/2024 CLINICAL DATA:  RIGHT anterior chest pain. EXAM: PORTABLE CHEST 1 VIEW COMPARISON:  None Available. FINDINGS: The heart size and mediastinal contours are within normal limits. Both lungs are clear. The visualized skeletal structures are unremarkable. Atrial appendage closure device noted IMPRESSION: Insert no acute Electronically Signed   By: Jackquline Boxer M.D.   On: 01/14/2024 14:14   _______________________________________________________________________________________________________ Latest  Blood pressure 109/72, pulse (!) 108, temperature 99 F (37.2 C), temperature source Oral, resp. rate (!) 27, height 5' 3 (1.6 m), weight 92.9 kg, SpO2 92%.   Vitals  labs and radiology finding personally reviewed  Review of Systems:    Pertinent positives include:  chest pain,   lower extremity swelling  Constitutional:  No weight loss, night sweats, Fevers, chills, fatigue, weight loss  HEENT:  No headaches, Difficulty swallowing,Tooth/dental problems,Sore throat,  No sneezing, itching, ear ache, nasal congestion, post nasal drip,  Cardio-vascular:  No Orthopnea, PND, anasarca, dizziness, palpitations.no Bilateral GI:  No heartburn, indigestion, abdominal pain, nausea, vomiting, diarrhea, change in bowel habits, loss of appetite, melena, blood in stool, hematemesis Resp:  no shortness of breath at rest. No dyspnea on  exertion, No excess mucus, no productive cough, No non-productive cough, No coughing up of blood.No change in color of mucus.No wheezing. Skin:  no rash or lesions. No jaundice GU:  no dysuria, change in color of urine, no urgency or frequency. No straining to urinate.  No flank pain.  Musculoskeletal:  No joint pain or no joint swelling. No decreased range of motion. No back pain.  Psych:  No change in mood or affect. No depression or anxiety. No memory loss.  Neuro: no localizing neurological complaints, no tingling, no weakness, no double vision, no gait abnormality, no slurred speech, no confusion  All systems reviewed and apart from HOPI all are negative _______________________________________________________________________________________________ Past Medical History:   Past Medical History:  Diagnosis Date   Anemia    Arthritis    Ostearthritis- hips, knees, fingers   Atrial fibrillation (HCC)    Basal cell carcinoma (BCC) of right hand 10/2021   Dx by [facility]   Blood transfusion without reported diagnosis 2021   Chronic kidney disease    DVT (deep venous thrombosis) (HCC)    Dyspnea    Dysrhythmia    GERD (gastroesophageal reflux disease)    Headache(784.0)    tx. Valproic  acid   History of diabetes mellitus    History of kidney stones    Hypertension    Hypothyroidism    Presence of Watchman left atrial appendage closure device 06/15/2023   24mm Watchman FLX Pro device placed by Dr. Cindie   Pulmonary embolism [facility])       Past Surgical History:  Procedure Laterality Date   ABDOMINAL HYSTERECTOMY     BILATERAL HIP ARTHROSCOPY Left    BIOPSY  12/08/2019   Procedure: BIOPSY;  Surgeon: Shaaron Lamar HERO, MD;  Location: [facility];  Service: Endoscopy;;   CATARACT EXTRACTION, BILATERAL     COLONOSCOPY N/A 12/08/2019   polyps (tubular adenoma), diverticulosis, colonic lipoma, no surveillance due to age   CYSTOSCOPY W/ URETERAL STENT PLACEMENT Right  12/25/2020   Procedure: CYSTOSCOPY WITH RETROGRADE PYELOGRAM/URETERAL STENT PLACEMENT;  Surgeon: Selma,  Donnice SAUNDERS, MD;  Location: [facility];  Service: Urology;  Laterality: Right;   CYSTOSCOPY/URETEROSCOPY/HOLMIUM LASER/STENT PLACEMENT Right 01/15/2021   Procedure: CYSTOSCOPY/RETROGRADE/URETEROSCOPY/HOLMIUM LASER/STENT EXCHANGE;  Surgeon: Selma Donnice SAUNDERS, MD;  Location: [facility];  Service: Urology;  Laterality: Right;   ESOPHAGOGASTRODUODENOSCOPY N/A 12/08/2019   normal esophagus with 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   ESOPHAGOGASTRODUODENOSCOPY (EGD) WITH PROPOFOL  N/A 05/18/2021   Surgeon: Magod, Marc, MD;   Tiny hiatal hernia, gastric antral vascular ectasia s/p ablation, normal duodenum, normal jejunum.   ESOPHAGOGASTRODUODENOSCOPY (EGD) WITH PROPOFOL  N/A 03/10/2022   Procedure: ESOPHAGOGASTRODUODENOSCOPY (EGD) WITH PROPOFOL ;  Surgeon: Cindie Carlin POUR, DO;  [facility];  Service: Endoscopy;  Laterality: N/A;  10:45am   ESOPHAGOGASTRODUODENOSCOPY (EGD) WITH PROPOFOL  N/A 02/01/2023   Procedure: ESOPHAGOGASTRODUODENOSCOPY (EGD) WITH PROPOFOL ;  Surgeon: Cindie Carlin POUR, DO;  [facility];  Service: Endoscopy;  Laterality: N/A;  9:00 am, asa 3   EYE SURGERY  2015   Cataracts   GIVENS CAPSULE STUDY N/A 01/13/2020   Procedure: GIVENS CAPSULE STUDY;  Surgeon: Shaaron Lamar HERO, MD;  [facility];  Service: Endoscopy;  Laterality: N/A;  7:30am   GIVENS CAPSULE STUDY N/A 05/04/2021   Procedure: GIVENS CAPSULE STUDY;  Surgeon: Rosalie Kitchens, MD;  Location: [facility];  Service: Endoscopy;  Laterality: N/A;   JOINT REPLACEMENT  2012 2015   Hips   LEFT ATRIAL APPENDAGE OCCLUSION N/A 06/15/2023   Procedure: LEFT ATRIAL APPENDAGE OCCLUSION;  Surgeon: Cindie Ole DASEN, MD;  Location: [facility];  Service: Cardiovascular;  Laterality: N/A;   MULTIPLE TOOTH EXTRACTIONS     60's   PARATHYROIDECTOMY     POLYPECTOMY  12/08/2019    Procedure: POLYPECTOMY;  Surgeon: Shaaron Lamar HERO, MD;  [facility];  Service: Endoscopy;;   POLYPECTOMY  02/01/2023   Procedure: POLYPECTOMY;  Surgeon: Cindie Carlin POUR, DO;  [facility];  Service: Endoscopy;;   RADIOFREQUENCY ABLATION  05/18/2021   Procedure: RADIO FREQUENCY ABLATION;  Surgeon: Rosalie Kitchens, MD;  [facility]: [facility];  Service: Endoscopy;;   TEE WITHOUT CARDIOVERSION N/A 07/06/2021   Procedure: TRANSESOPHAGEAL ECHOCARDIOGRAM (TEE);  Surgeon: Kate Lonni CROME, MD;  Location: [facility] ENDOSCOPY;  Service: Cardiovascular;  Laterality: N/A;   TEE WITHOUT CARDIOVERSION N/A 06/15/2023   Procedure: TRANSESOPHAGEAL ECHOCARDIOGRAM;  Surgeon: Cindie Ole DASEN, MD;  Location: [facility] CV LAB;  Service: Cardiovascular;  Laterality: N/A;   TOTAL HIP ARTHROPLASTY Right 10/29/2013   Procedure: RIGHT TOTAL HIP ARTHROPLASTY ANTERIOR APPROACH;  Surgeon: Donnice JONETTA Car, MD;  Location: [facility];  Service: Orthopedics;  Laterality: Right;   TRANSESOPHAGEAL ECHOCARDIOGRAM (CATH LAB) N/A 08/18/2023   Procedure: TRANSESOPHAGEAL ECHOCARDIOGRAM;  Surgeon: Francyne Headland, MD;  Location: [facility];  Service: Cardiovascular;  Laterality: N/A;   TUBAL LIGATION  1972    Social History:  Ambulatory  walker      reports that she has never smoked. She has never used smokeless tobacco. She reports that she does not drink alcohol  and does not use drugs.   Family History:   Family History  Problem Relation Age of Onset   Colitis Sister 40       alive   Cancer Sister 24       colon   Cancer Mother 49       uterine   Heart disease Father 48       heart failure   Heart attack Brother 45   Healthy Daughter  Healthy Son    Pulmonary embolism Brother 48   Heart disease Brother    Healthy Son    Healthy Son    ______________________________________________________________________________________________ Allergies: No Known Allergies   Prior to Admission  medications   Medication Sig Start Date End Date Taking? Authorizing Provider  acetaminophen  (TYLENOL ) 325 MG tablet Take 2 tablets (650 mg total) by mouth every 6 (six) hours as needed for mild pain or headache (or Fever >/= 101). 04/04/19   Pearlean Manus, MD  allopurinol  (ZYLOPRIM ) 100 MG tablet Take 1 tablet (100 mg total) by mouth daily. 11/24/23   Joesph Annabella HERO, FNP  amLODipine  (NORVASC ) 5 MG tablet Take 1 tablet (5 mg total) by mouth daily. 04/05/23   Lavona Agent, MD  atorvastatin  (LIPITOR) 20 MG tablet Take 1 tablet (20 mg total) by mouth daily. 11/24/23   Joesph Annabella HERO, FNP  cetirizine  (ZYRTEC ) 10 MG tablet Take 10 mg by mouth daily.    [provider]  dapagliflozin  propanediol (FARXIGA ) 10 MG TABS tablet Take 1 tablet (10 mg total) by mouth daily. 09/22/23   Joesph Annabella HERO, FNP  ferrous sulfate  325 (65 FE) MG EC tablet Take 325 mg by mouth daily.    [provider]  furosemide  (LASIX ) 20 MG tablet Take 1 tablet by mouth once daily 09/14/23   Lavona Agent, MD  [facility] CRANBERRY EXTRACT PO Take 1 tablet by mouth in the morning.    [provider]  levothyroxine  (SYNTHROID ) 50 MCG tablet Take 1 tablet (50 mcg total) by mouth daily before breakfast. 11/24/23   Joesph Annabella HERO, FNP  losartan  (COZAAR ) 25 MG tablet Take 1 tablet (25 mg total) by mouth daily. 11/24/23   Joesph Annabella HERO, FNP  metoprolol  succinate (TOPROL  XL) 25 MG 24 hr tablet Take 1 tablet (25 mg total) by mouth daily. 09/26/23   Sebastian Lamarr SAUNDERS, PA-C  oxybutynin  (DITROPAN -XL) 10 MG 24 hr tablet Take 1 tablet (10 mg total) by mouth at bedtime. 11/24/23   Joesph Annabella HERO, FNP  pantoprazole  (PROTONIX ) 40 MG tablet Take 1 tablet (40 mg total) by mouth 2 (two) times daily before a meal. 11/24/23   Joesph Annabella HERO, FNP    ___________________________________________________________________________________________________ Physical Exam:    01/14/2024    8:00 PM 01/14/2024    7:35 PM 01/14/2024     7:15 PM  Vitals with BMI  Height   5' 3  Weight   204 lbs 14 oz  BMI   36.31  Systolic 109 133 857  Diastolic 72 107 94  Pulse 108 885 125     1. General:  in No  Acute distress    Chronically ill  -appearing 2. Psychological: Alert and   Oriented 3. Head/ENT:   Dry Mucous Membranes                          Head Non traumatic, neck supple                         Poor Dentition 4. SKIN: normal   Skin turgor,  Skin clean Dry and intact no rash    5. Heart: Regular rate and rhythm no  Murmur, no Rub or gallop 6. Lungs no wheezes or crackles   7. Abdomen: Soft,  non-tender, Non distended   obese  bowel sounds present 8. Lower extremities: no clubbing, cyanosis, no  edema 9. Neurologically Grossly intact, moving all 4  extremities equally   10. MSK: Normal range of motion    Chart has been reviewed  ______________________________________________________________________________________________  Assessment/Plan 83 y.o. female with medical history significant of A-fib status post watchman, iron deficiency anemia, history of PE, HFimpEF, mitral valve disease, HTN, CKD stage IIIa,  GI bleeding, aortic atherosclerosis   Admitted for  moderate PE Atrial fibrillation with RVR (HCC) acute respiratory failure with hypoxia     Present on Admission:  Acute pulmonary embolism (HCC)  Acquired hypothyroidism  Anemia  Paroxysmal atrial fibrillation with RVR (HCC)  Presence of Watchman left atrial appendage closure device  Primary hypertension  Stage 3a chronic kidney disease (HCC)  Hyperlipidemia  Morbid obesity (HCC)  Chronic combined systolic and diastolic CHF (congestive heart failure) (HCC)  Acute respiratory failure with hypoxia (HCC)   Acquired hypothyroidism - Check TSH continue home medications Synthroid  at 50mcg po q day   Acute pulmonary embolism (HCC)  Admit to progressive Initiate heparin  drip  Would likely benefit from case manager consult for long term  anticoagulation Hold home blood pressure medications avoid hypotension Given A-fib with RVR discussed with cardiology will change Toprol  to metoprolol  with holding parameters 12.5 3 times daily Cycle cardiac enzymes Order echogram and lower extremity Dopplers  Most likely risk factors for hypercoagulable state being obesity   sedentary lifestyle    Given large clot burden appreciate pulmonology consult in a.m. discussed case with pulmonology tonight   Anemia Obtain anemia panel  Transfuse for Hg <7 , rapidly dropping or  if symptomatic   Paroxysmal atrial fibrillation with RVR (HCC) History of paroxysmal atrial fibrillation.  Patient no longer on anticoagulation status post watchman had history of GI bleeds. Currently on heparin  for PE Noted A-fib of RVR Discussed possibility of using amiodarone  versus increasing metoprolol  with cardiology.  Recommend that this stick  to metoprolol  changed to short lasting metoprolol  12.5 mg every 8 hours with holding parameters Amiodarone  still would have a very low risk of potential stroke and IV amiodarone  have a risk of drop in blood pressure appreciate cardiology consult will email for patient to be added to the list in a.m. if needed may need stat consult tonight if patient decompensates  Presence of Watchman left atrial appendage closure device Chronic stable appreciate cardiology input  Primary hypertension Hold home medications for tonight  Stage 3a chronic kidney disease (HCC)  -chronic avoid nephrotoxic medications such as NSAIDs, Vanco Zosyn  combo,  avoid hypotension, continue to follow renal function   Type 2 diabetes mellitus without complication, without long-term current use of insulin  (HCC) Patient denies last hemoglobin A1c 4.8  Hyperlipidemia Continue Lipitor 20 mg daily  Morbid obesity (HCC) Contributing to comorbidity and complicating medical management  Body mass index is 36.3 kg/m.  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) Appreciate cardiology input  Chronic combined systolic and diastolic CHF (congestive heart failure) (HCC) Hold Lasix  for tonight if anything may need gentle rehydration in the setting of PE to increase preload monitor fluid status  Acute respiratory failure with hypoxia (HCC)  this patient has acute respiratory failure with Hypoxia  as documented by the presence of following: O2 saturatio< 90% on RA   Likely due to: PE Provide O2 therapy and titrate as needed  Continuous pulse ox   check Pulse ox with ambulation prior to discharge   may need  TC consult for home O2 set up      Other plan as per  orders.  DVT prophylaxis: Heparin    Code Status:    DNR/DNI   as per patient   I had personally discussed CODE STATUS with patient   ACp  has been reviewed     Family Communication:   Family not at  Bedside    Diet diabetic   Disposition Plan:    To home once workup is complete and patient is stable   Following barriers for discharge:                             Chest pain work up is complete                                                         Anemia   stable                             Pain controlled with PO medications                                                          Will need consultants to evaluate patient prior to discharge                                Consult Orders  (From admission, onward)           Start     Ordered   01/14/24 1555  Consult to hospitalist  Once       Provider:  (Not yet assigned)  Question Answer Comment  Place call to: [facility]   Reason for Consult Admit      01/14/24 1554                               Consults called: Pulmonology aware will see in a.m. Cardiology discussed with night fellow will email for cardiology to see in a.m.   Admission status:  ED Disposition     ED Disposition  Admit   Condition  --   Comment  Hospital Area: [facility] [[id number]]  Level of Care: Progressive [102]  Admit to Progressive based on following criteria: RESPIRATORY PROBLEMS hypoxemic/hypercapnic respiratory failure that is responsive to NIPPV (BiPAP) or High Flow Nasal Cannula (6-80 lpm). Frequent assessment/intervention, no > Q2 hrs < Q4 hrs, to maintain oxygenation and pulmonary hygiene.  Interfacility transfer: Yes  May place patient in observation at [facility] or [facility] if equivalent level of care is available:: Yes  Covid Evaluation: Asymptomatic - no recent exposure (last 10 days) testing not required  Diagnosis: Acute pulmonary embolism [facility]) [[id number]]  Admitting Physician: MELVIN, ALEXANDER B [[id number]]  Attending Physician: MELVIN, ALEXANDER B [[id number]]           Obs      Level of care      progressive    Blease Quiver 01/14/2024, 9:19 PM    [facility]     after 2 AM  please page floor coverage   If 7AM-7PM, please contact the day team taking care of the patient using [email or web address]

## 2024-01-14 NOTE — Assessment & Plan Note (Signed)
 Chronic stable appreciate cardiology input

## 2024-01-14 NOTE — ED Notes (Signed)
 Purple man green.

## 2024-01-14 NOTE — Assessment & Plan Note (Signed)
 Continue Lipitor 20 mg daily.

## 2024-01-14 NOTE — ED Notes (Signed)
 [facility] called for hospitalist consult.

## 2024-01-14 NOTE — Assessment & Plan Note (Signed)
 Hold Lasix  for tonight if anything may need gentle rehydration in the setting of PE to increase preload monitor fluid status

## 2024-01-14 NOTE — Assessment & Plan Note (Signed)
-   Check TSH continue home medications Synthroid at 50 mcg po q day

## 2024-01-14 NOTE — Progress Notes (Signed)
 ANTICOAGULATION CONSULT NOTE  Pharmacy Consult for Heparin  Indication: pulmonary embolus  No Known Allergies  Patient Measurements: Weight: 90.7 kg (200 lb) Heparin  Dosing Weight: 73.1 kg  Vital Signs: Temp: 98.2 F (36.8 C) (06/22 1400) BP: 143/80 (06/22 1500) Pulse Rate: 105 (06/22 1500)  Labs: Recent Labs    01/12/24 1100 01/14/24 1116  HGB 12.1 12.1  HCT 37.2 37.4  PLT 172 156  CREATININE 1.24* 1.43*    Estimated Creatinine Clearance: 32.4 mL/min (A) (by C-G formula based on SCr of 1.43 mg/dL (H)).   Medical History: Past Medical History:  Diagnosis Date   Anemia    Arthritis    Ostearthritis- hips, knees, fingers   Atrial fibrillation (HCC)    Basal cell carcinoma (BCC) of right hand 10/2021   Dx by [facility]   Blood transfusion without reported diagnosis 2021   Chronic kidney disease    DVT (deep venous thrombosis) (HCC)    Dyspnea    Dysrhythmia    GERD (gastroesophageal reflux disease)    Headache(784.0)    tx. Valproic  acid   History of diabetes mellitus    History of kidney stones    Hypertension    Hypothyroidism    Presence of Watchman left atrial appendage closure device 06/15/2023   24mm Watchman FLX Pro device placed by Dr. Cindie   Pulmonary embolism [facility])     Medications:  (Not in a hospital admission)  Scheduled:  Infusions:  PRN:   Assessment: 89 yof presenting with chest discomfort. History of PE no longer on eliquis  2/2 GIB. Heparin  per pharmacy consult placed for pulmonary embolus.  CTA PE w/ bilateral PE no evidence of RHS  Patient is not on anticoagulation prior to arrival.  Hgb 12.1; plt 156  Goal of Therapy:  Heparin  level 0.3-0.7 units/ml Monitor platelets by anticoagulation protocol: Yes   Plan:  Give IV heparin  4500 units bolus x 1 Start heparin  infusion at 1300 units/hr Check anti-Xa level in 8 hours and daily while on heparin  Continue to monitor H&H and platelets  Dorn Buttner, PharmD,  BCPS 01/14/2024 4:02 PM ED Clinical Pharmacist -  [phone]

## 2024-01-14 NOTE — Assessment & Plan Note (Signed)
 Contributing to comorbidity and complicating medical management  Body mass index is 36.3 kg/m.  Nutritional follow up as an out pt would be recommended

## 2024-01-14 NOTE — Assessment & Plan Note (Signed)
 Patient denies last hemoglobin A1c 4.8

## 2024-01-14 NOTE — Assessment & Plan Note (Signed)
 Hold home medications for tonight

## 2024-01-14 NOTE — ED Triage Notes (Signed)
 Last night sudden sharp pain under right breast,intermittent and short of breath as well. She has hx of PE and DVT in 2020 and was on anticoagulation til this January.  Had watchman placed in Nov 2024

## 2024-01-14 NOTE — ED Provider Notes (Signed)
 Clinical Course as of 01/14/24 1659  Sun Jan 14, 2024  1545 Received signout pending CTA.  CTA is completed and I personally reviewed images and on my independent interpretation it does appear that she has a large right-sided PE segmental and subsegmental.  Will start heparin  and plan for admission.  [TY]  1553 Radiology called and confirmed PE. Does have bilateral PE, but right greater than left. No evidence of right heart strain.  [TY]  1554 CT Angio Chest PE W/Cm &/Or Wo Cm MPRESSION: 1. Bilateral pulmonary emboli, moderate volume. No evidence of right heart strain. 2. Small left pleural effusion and trace right pleural effusion with atelectasis. 3. Distended pulmonary trunk suggesting underlying pulmonary artery hypertension. 4. Cardiomegaly with coronary artery calcifications. 5. Aortic atherosclerosis.   Electronically Signed   By: Leita Birmingham M.D.   On: 01/14/2024 15:49   [TY]    Clinical Course User Index [TY] Neysa Caron PARAS, DO   Case discussed with hospitalist; will admit patient.    Neysa Caron PARAS, DO 01/14/24 1659

## 2024-01-14 NOTE — Assessment & Plan Note (Signed)
 Obtain anemia panel  Transfuse for Hg <7 , rapidly dropping or  if symptomatic

## 2024-01-14 NOTE — Assessment & Plan Note (Addendum)
 History of paroxysmal atrial fibrillation.  Patient no longer on anticoagulation status post watchman had history of GI bleeds. Currently on heparin  for PE Noted A-fib of RVR Discussed possibility of using amiodarone  versus increasing metoprolol  with cardiology.  Recommend that this stick  to metoprolol  changed to short lasting metoprolol  12.5 mg every 8 hours with holding parameters Amiodarone  still would have a very low risk of potential stroke and IV amiodarone  have a risk of drop in blood pressure appreciate cardiology consult will email for patient to be added to the list in a.m. if needed may need stat consult tonight if patient decompensates

## 2024-01-14 NOTE — Assessment & Plan Note (Signed)
Appreciate cardiology input. 

## 2024-01-14 NOTE — Assessment & Plan Note (Signed)
 this patient has acute respiratory failure with Hypoxia  as documented by the presence of following: ?O2 saturatio< 90% on RA  ?Likely due to:PE ?Provide O2 therapy and titrate as needed ? Continuous pulse ox ?  check Pulse ox with ambulation prior to discharge ?  may need  TC consult for home O2 set up ?  ?

## 2024-01-14 NOTE — Assessment & Plan Note (Signed)
 Admit to progressive Initiate heparin  drip  Would likely benefit from case manager consult for long term anticoagulation Hold home blood pressure medications avoid hypotension Given A-fib with RVR discussed with cardiology will change Toprol  to metoprolol  with holding parameters 12.5 3 times daily Cycle cardiac enzymes Order echogram and lower extremity Dopplers  Most likely risk factors for hypercoagulable state being obesity   sedentary lifestyle    Given large clot burden appreciate pulmonology consult in a.m. discussed case with pulmonology tonight

## 2024-01-14 NOTE — Subjective & Objective (Signed)
 Started to have sudden onset of sharp right-sided chest pain intermittent worse with deep breaths reminiscent of prior history of PE.  She used to be on anticoagulation but this had to be stopped in January.  Patient has known history of A-fib status post Watchman procedure November 2024 At baseline she is able to move around but not very active.  Denies any history of malignancy. Reports her legs have been swelling in particular left worse than right. [place] recent travel. Reports associated shortness of breath.  At baseline not on oxygen  At [facility] EKG showed A-fib with RVR on room air satting 89% started on O2 and advanced to 4 L. Reports in the past she was diabetic but has not required any of her medications for some time.

## 2024-01-14 NOTE — Assessment & Plan Note (Signed)
-  chronic avoid nephrotoxic medications such as NSAIDs, Vanco Zosyn combo,  avoid hypotension, continue to follow renal function

## 2024-01-15 ENCOUNTER — Observation Stay (HOSPITAL_COMMUNITY)

## 2024-01-15 ENCOUNTER — Telehealth (HOSPITAL_COMMUNITY): Payer: Self-pay | Admitting: Pharmacy Technician

## 2024-01-15 ENCOUNTER — Other Ambulatory Visit (HOSPITAL_COMMUNITY): Payer: Self-pay

## 2024-01-15 DIAGNOSIS — I4891 Unspecified atrial fibrillation: Secondary | ICD-10-CM | POA: Diagnosis not present

## 2024-01-15 DIAGNOSIS — I2699 Other pulmonary embolism without acute cor pulmonale: Secondary | ICD-10-CM | POA: Diagnosis not present

## 2024-01-15 DIAGNOSIS — D631 Anemia in chronic kidney disease: Secondary | ICD-10-CM | POA: Diagnosis not present

## 2024-01-15 DIAGNOSIS — R0602 Shortness of breath: Secondary | ICD-10-CM | POA: Diagnosis present

## 2024-01-15 DIAGNOSIS — I482 Chronic atrial fibrillation, unspecified: Secondary | ICD-10-CM | POA: Diagnosis not present

## 2024-01-15 DIAGNOSIS — E785 Hyperlipidemia, unspecified: Secondary | ICD-10-CM | POA: Diagnosis not present

## 2024-01-15 DIAGNOSIS — N179 Acute kidney failure, unspecified: Secondary | ICD-10-CM | POA: Diagnosis not present

## 2024-01-15 DIAGNOSIS — Z66 Do not resuscitate: Secondary | ICD-10-CM | POA: Diagnosis not present

## 2024-01-15 DIAGNOSIS — Z7901 Long term (current) use of anticoagulants: Secondary | ICD-10-CM | POA: Diagnosis not present

## 2024-01-15 DIAGNOSIS — I5021 Acute systolic (congestive) heart failure: Secondary | ICD-10-CM | POA: Diagnosis not present

## 2024-01-15 DIAGNOSIS — Z79899 Other long term (current) drug therapy: Secondary | ICD-10-CM | POA: Diagnosis not present

## 2024-01-15 DIAGNOSIS — E890 Postprocedural hypothyroidism: Secondary | ICD-10-CM | POA: Diagnosis not present

## 2024-01-15 DIAGNOSIS — I700 Atherosclerosis of aorta: Secondary | ICD-10-CM | POA: Diagnosis not present

## 2024-01-15 DIAGNOSIS — N1831 Chronic kidney disease, stage 3a: Secondary | ICD-10-CM | POA: Diagnosis not present

## 2024-01-15 DIAGNOSIS — Z86711 Personal history of pulmonary embolism: Secondary | ICD-10-CM

## 2024-01-15 DIAGNOSIS — I2609 Other pulmonary embolism with acute cor pulmonale: Secondary | ICD-10-CM | POA: Diagnosis not present

## 2024-01-15 DIAGNOSIS — Q233 Congenital mitral insufficiency: Secondary | ICD-10-CM | POA: Diagnosis not present

## 2024-01-15 DIAGNOSIS — D500 Iron deficiency anemia secondary to blood loss (chronic): Secondary | ICD-10-CM | POA: Diagnosis not present

## 2024-01-15 DIAGNOSIS — I130 Hypertensive heart and chronic kidney disease with heart failure and stage 1 through stage 4 chronic kidney disease, or unspecified chronic kidney disease: Secondary | ICD-10-CM | POA: Diagnosis not present

## 2024-01-15 DIAGNOSIS — Z8249 Family history of ischemic heart disease and other diseases of the circulatory system: Secondary | ICD-10-CM | POA: Diagnosis not present

## 2024-01-15 DIAGNOSIS — I5043 Acute on chronic combined systolic (congestive) and diastolic (congestive) heart failure: Secondary | ICD-10-CM | POA: Diagnosis not present

## 2024-01-15 DIAGNOSIS — J9601 Acute respiratory failure with hypoxia: Secondary | ICD-10-CM | POA: Diagnosis not present

## 2024-01-15 DIAGNOSIS — I82413 Acute embolism and thrombosis of femoral vein, bilateral: Secondary | ICD-10-CM | POA: Diagnosis not present

## 2024-01-15 DIAGNOSIS — Z85828 Personal history of other malignant neoplasm of skin: Secondary | ICD-10-CM | POA: Diagnosis not present

## 2024-01-15 DIAGNOSIS — I82432 Acute embolism and thrombosis of left popliteal vein: Secondary | ICD-10-CM | POA: Diagnosis not present

## 2024-01-15 DIAGNOSIS — Z79890 Hormone replacement therapy (postmenopausal): Secondary | ICD-10-CM | POA: Diagnosis not present

## 2024-01-15 DIAGNOSIS — I2510 Atherosclerotic heart disease of native coronary artery without angina pectoris: Secondary | ICD-10-CM | POA: Diagnosis not present

## 2024-01-15 DIAGNOSIS — E1122 Type 2 diabetes mellitus with diabetic chronic kidney disease: Secondary | ICD-10-CM | POA: Diagnosis not present

## 2024-01-15 DIAGNOSIS — I4819 Other persistent atrial fibrillation: Secondary | ICD-10-CM | POA: Diagnosis not present

## 2024-01-15 LAB — COMPREHENSIVE METABOLIC PANEL WITH GFR
ALT: 10 U/L (ref 0–44)
AST: 9 U/L — ABNORMAL LOW (ref 15–41)
Albumin: 3.2 g/dL — ABNORMAL LOW (ref 3.5–5.0)
Alkaline Phosphatase: 84 U/L (ref 38–126)
Anion gap: 6 (ref 5–15)
BUN: 22 mg/dL (ref 8–23)
CO2: 21 mmol/L — ABNORMAL LOW (ref 22–32)
Calcium: 10.4 mg/dL — ABNORMAL HIGH (ref 8.9–10.3)
Chloride: 112 mmol/L — ABNORMAL HIGH (ref 98–111)
Creatinine, Ser: 1.07 mg/dL — ABNORMAL HIGH (ref 0.44–1.00)
GFR, Estimated: 52 mL/min — ABNORMAL LOW (ref >60)
Glucose, Bld: 126 mg/dL — ABNORMAL HIGH (ref 70–99)
Potassium: 4 mmol/L (ref 3.5–5.1)
Sodium: 139 mmol/L (ref 135–145)
Total Bilirubin: 0.7 mg/dL (ref 0.0–1.2)
Total Protein: 5.7 g/dL — ABNORMAL LOW (ref 6.5–8.1)

## 2024-01-15 LAB — CBC
HCT: 35.1 % — ABNORMAL LOW (ref 36.0–46.0)
Hemoglobin: 11 g/dL — ABNORMAL LOW (ref 12.0–15.0)
MCH: 30.2 pg (ref 26.0–34.0)
MCHC: 31.3 g/dL (ref 30.0–36.0)
MCV: 96.4 fL (ref 80.0–100.0)
Platelets: 156 10*3/uL (ref 150–400)
RBC: 3.64 MIL/uL — ABNORMAL LOW (ref 3.87–5.11)
RDW: 13.8 % (ref 11.5–15.5)
WBC: 7.7 10*3/uL (ref 4.0–10.5)
nRBC: 0 % (ref 0.0–0.2)

## 2024-01-15 LAB — ECHOCARDIOGRAM COMPLETE
Area-P 1/2: 6.71 cm2
Calc EF: 46 %
Height: 63 in
S' Lateral: 4.4 cm
Single Plane A2C EF: 39 %
Single Plane A4C EF: 50.5 %
Weight: 3278.4 [oz_av]

## 2024-01-15 LAB — PHOSPHORUS: Phosphorus: 3.1 mg/dL (ref 2.5–4.6)

## 2024-01-15 LAB — HEPARIN LEVEL (UNFRACTIONATED)
Heparin Unfractionated: 0.43 [IU]/mL (ref 0.30–0.70)
Heparin Unfractionated: 0.43 [IU]/mL (ref 0.30–0.70)

## 2024-01-15 LAB — MAGNESIUM: Magnesium: 2.2 mg/dL (ref 1.7–2.4)

## 2024-01-15 LAB — URINE CULTURE: Culture: <10000 — AB

## 2024-01-15 MED ORDER — METOPROLOL TARTRATE 25 MG PO TABS: 25.0000 mg | ORAL_TABLET | Freq: Four times a day (QID) | ORAL | Status: DC

## 2024-01-15 MED ORDER — PERFLUTREN LIPID MICROSPHERE
1.0000 mL | INTRAVENOUS | Status: AC | PRN
  Administered 2024-01-15: 2 mL via INTRAVENOUS

## 2024-01-15 MED ORDER — FENTANYL CITRATE PF 50 MCG/ML IJ SOSY: 12.5000 ug | PREFILLED_SYRINGE | INTRAMUSCULAR | Status: DC | PRN

## 2024-01-15 MED ORDER — METOPROLOL TARTRATE 25 MG PO TABS
25.0000 mg | ORAL_TABLET | Freq: Four times a day (QID) | ORAL | Status: DC
Start: 2024-01-15 — End: 2024-01-16
  Administered 2024-01-15 (×3): 25 mg via ORAL
  Filled 2024-01-15 (×4): qty 1

## 2024-01-15 MED ORDER — PHENOL 1.4 % MT LIQD
1.0000 | OROMUCOSAL | Status: DC | PRN
  Administered 2024-01-15: 1 via OROMUCOSAL
  Filled 2024-01-15: qty 177

## 2024-01-15 MED ORDER — FUROSEMIDE 10 MG/ML IJ SOLN
40.0000 mg | Freq: Once | INTRAMUSCULAR | Status: AC
  Administered 2024-01-15: 40 mg via INTRAVENOUS
  Filled 2024-01-15: qty 4

## 2024-01-15 MED ORDER — METOPROLOL TARTRATE 25 MG PO TABS: 25.0000 mg | ORAL_TABLET | Freq: Two times a day (BID) | ORAL | Status: DC

## 2024-01-15 NOTE — Progress Notes (Signed)
 ANTICOAGULATION CONSULT NOTE  Pharmacy Consult for Heparin  Indication: pulmonary embolus  No Known Allergies  Patient Measurements: Height: 5' 3 (160 cm) Weight: 92.9 kg (204 lb 14.4 oz) IBW/kg (Calculated) : 52.4 Heparin  Dosing Weight: 73.1 kg  Vital Signs: Temp: 98.2 F (36.8 C) (06/23 1053) Temp Source: Oral (06/23 1053) BP: 131/79 (06/23 1053) Pulse Rate: 99 (06/23 1053)  Labs: Recent Labs    01/14/24 1116 01/14/24 2108 01/14/24 2233 01/15/24 0018 01/15/24 0311 01/15/24 1038  HGB 12.1  --   --   --  11.0*  --   HCT 37.4  --   --   --  35.1*  --   PLT 156  --   --   --  156  --   HEPARINUNFRC  --   --   --  0.43  --  0.43  CREATININE 1.43*  --   --   --  1.07*  --   TROPONINIHS  --  9 10  --   --   --     Estimated Creatinine Clearance: 43.9 mL/min (A) (by C-G formula based on SCr of 1.07 mg/dL (H)).  Assessment: 8 yof presenting with chest discomfort. History of PE no longer on eliquis  2/2 GIB. Heparin  per pharmacy consult placed for pulmonary embolus. CTA PE w/ bilateral PE no evidence of RHS. Patient is not on anticoagulation prior to arrival.  Heparin  level this afternoon remains within goal on 1300 units/hr. Per RN, no signs/symptoms of bleeding. Last CBC stable  Goal of Therapy:  Heparin  level 0.3-0.7 units/ml Monitor platelets by anticoagulation protocol: Yes   Plan:  Continue IV heparin  infusion at 1400 units/hr Check heparin  level daily while on heparin  Continue to monitor H&H and platelets  Harlene Barlow, Berdine BIRCH, BCPS, BCCP Clinical Pharmacist  01/15/2024 12:44 PM   [facility] pharmacy phone numbers are listed on [email or web address]

## 2024-01-15 NOTE — Progress Notes (Signed)
 PROGRESS NOTE    [name] [name]  FMW:[id number] DOB: 1941/05/25 DOA: 01/14/2024 PCP: Joesph Annabella HERO, FNP  83/F with history of chronic systolic CHF with recovered EF, A-fib with watchman's device, history of iron deficiency anemia, GAVEs' disease, history of PE, CKD 3 AA, hypertension Was taken off Eliquis  in January following her Watchman procedure in November 2020 for.  Presented to the ED with swelling in both legs worse on the left, also developed right-sided chest pain yesterday.  In the ED noted to be in A-fib RVR, hypoxic placed on 4 L O2, CTA chest noted bilateral pulmonary emboli of moderate volume, small bilateral pleural effusions - Admitted, started on IV heparin   Subjective: Feels better, some dyspnea but improving, chest pain has improved  Assessment and Plan:  Acute bilateral PE Bilateral DVT Acute hypoxic respiratory failure -After coming off anticoagulation in January 2025 - Back on IV heparin , transition to Eliquis  tomorrow if stable -Pulmonary was consulted per admitting MD for clot burden - Follow-up 2D echo to evaluate RV  A-fib RVR - History of Watchman device - Continue metoprolol  today, heart rate improved - Back on anticoagulation for PE/DVT  Acute on chronic systolic, diastolic CHF - Recovered EF - Last echo 1/25 with EF 50-55%, normal RV - IVF discontinued, Lasix  40 Mg x 1 - Restart Farxiga   Mild AKI - Likely hemodynamically mediated, improving  Chronic iron deficiency anemia from GI blood loss GAVE's disease - On periodic iron infusions, hemoglobin relatively stable at this time  2 diabetes mellitus - Restarting Farxiga , monitor SSI  Obesity  DVT prophylaxis:IV heparin  Code Status: DNR Family Communication: None present Disposition Plan: Home in 48h if stable  Consultants: Pulm   Procedures:   Antimicrobials:    Objective: Vitals:   01/15/24 0200 01/15/24 0300 01/15/24 0400 01/15/24 0722  BP: 119/76 125/80 122/70 118/66   Pulse: (!) 133 100 97 92  Resp: (!) 21 (!) 25 19 20   Temp:   98.8 F (37.1 C) 98.5 F (36.9 C)  TempSrc:   Oral Oral  SpO2: 93% 91% 91% 94%  Weight:      Height:        Intake/Output Summary (Last 24 hours) at 01/15/2024 1004 Last data filed at 01/15/2024 0830 Gross per 24 hour  Intake 1952.28 ml  Output --  Net 1952.28 ml   Filed Weights   01/14/24 1015 01/14/24 1602 01/14/24 1915  Weight: 90.7 kg 90.7 kg 92.9 kg    Examination:  General exam: Obese appears calm and comfortable, AAO x 3 no distress Respiratory system: Basilar rales Cardiovascular system: S1 & S2 heard, irregular Abd: nondistended, soft and nontender.Normal bowel sounds heard. Central nervous system: Alert and oriented. No focal neurological deficits. Extremities: 1+ edema Skin: No rashes Psychiatry:  Mood & affect appropriate.     Data Reviewed:   CBC: Recent Labs  Lab 01/12/24 1100 01/14/24 1116 01/15/24 0311  WBC 6.7 7.9 7.7  NEUTROABS 4.7  --   --   HGB 12.1 12.1 11.0*  HCT 37.2 37.4 35.1*  MCV 95.6 94.9 96.4  PLT 172 156 156   Basic Metabolic Panel: Recent Labs  Lab 01/12/24 1100 01/14/24 1116 01/14/24 2055 01/15/24 0311  NA 140 141  --  139  K 3.9 4.3  --  4.0  CL 110 106  --  112*  CO2 21* 22  --  21*  GLUCOSE 114* 110*  --  126*  BUN 40* 34*  --  22  CREATININE 1.24*  1.43*  --  1.07*  CALCIUM  11.0* 11.8*  --  10.4*  MG  --   --  2.1 2.2  PHOS  --   --  2.7 3.1   GFR: Estimated Creatinine Clearance: 43.9 mL/min (A) (by C-G formula based on SCr of 1.07 mg/dL (H)). Liver Function Tests: Recent Labs  Lab 01/12/24 1100 01/15/24 0311  AST 13* 9*  ALT 12 10  ALKPHOS 98 84  BILITOT 0.4 0.7  PROT 6.3* 5.7*  ALBUMIN  3.6 3.2*   No results for input(s): LIPASE, AMYLASE in the last 168 hours. No results for input(s): AMMONIA in the last 168 hours. Coagulation Profile: No results for input(s): INR, PROTIME in the last 168 hours. Cardiac Enzymes: No results  for input(s): CKTOTAL, CKMB, CKMBINDEX, TROPONINI in the last 168 hours. BNP (last 3 results) No results for input(s): PROBNP in the last 8760 hours. HbA1C: No results for input(s): HGBA1C in the last 72 hours. CBG: No results for input(s): GLUCAP in the last 168 hours. Lipid Profile: No results for input(s): CHOL, HDL, LDLCALC, TRIG, CHOLHDL, LDLDIRECT in the last 72 hours. Thyroid  Function Tests: Recent Labs    01/14/24 2055  TSH 2.297   Anemia Panel: Recent Labs    01/12/24 1101  VITAMINB12 254  FERRITIN 22  TIBC 336  IRON 27*   Urine analysis:    Component Value Date/Time   COLORURINE YELLOW 01/14/2024 1018   APPEARANCEUR CLEAR 01/14/2024 1018   APPEARANCEUR Clear 11/24/2023 1336   LABSPEC 1.016 01/14/2024 1018   PHURINE 5.5 01/14/2024 1018   GLUCOSEU >1,000 (A) 01/14/2024 1018   HGBUR NEGATIVE 01/14/2024 1018   BILIRUBINUR NEGATIVE 01/14/2024 1018   BILIRUBINUR Negative 11/24/2023 1336   KETONESUR NEGATIVE 01/14/2024 1018   PROTEINUR TRACE (A) 01/14/2024 1018   UROBILINOGEN 0.2 10/22/2013 1127   NITRITE NEGATIVE 01/14/2024 1018   LEUKOCYTESUR MODERATE (A) 01/14/2024 1018   Sepsis Labs: @LABRCNTIP (procalcitonin:4,lacticidven:4)  )No results found for this or any previous visit (from the past 240 hours).   Radiology Studies: CT Angio Chest PE W/Cm &/Or Wo Cm Addendum Date: 01/14/2024 ADDENDUM REPORT: 01/14/2024 15:54 ADDENDUM: Critical Value/emergent results were called by telephone at the time of interpretation on 01/14/2024 at 3:53 pm to provider Dr. Neysa, who verbally acknowledged these results. Electronically Signed   By: Leita Birmingham M.D.   On: 01/14/2024 15:54   Result Date: 01/14/2024 CLINICAL DATA:  Pulmonary embolism suspected, low to intermediate probability. Positive D-dimer. EXAM: CT ANGIOGRAPHY CHEST WITH CONTRAST TECHNIQUE: Multidetector CT imaging of the chest was performed using the standard protocol during bolus  administration of intravenous contrast. Multiplanar CT image reconstructions and MIPs were obtained to evaluate the vascular anatomy. RADIATION DOSE REDUCTION: This exam was performed according to the departmental dose-optimization program which includes automated exposure control, adjustment of the mA and/or kV according to patient size and/or use of iterative reconstruction technique. CONTRAST:  50mL OMNIPAQUE  IOHEXOL  350 MG/ML SOLN COMPARISON:  04/03/2019. FINDINGS: Cardiovascular: The heart is enlarged and there is a trace pericardial effusion. Scattered coronary artery calcifications are present. An occlusion device is present in the left atrial appendage. There is atherosclerotic calcification of the aorta without evidence of aneurysm. The pulmonary trunk is distended suggesting underlying pulmonary artery hypertension. Pulmonary artery filling defects are present in the right pulmonary artery extending into the lobar, segmental, of subsegmental arteries in the right upper, middle, and lower lobes. There are segmental and subsegmental pulmonary emboli in the left upper and lower lobes. There is no  evidence of right heart strain. Mediastinum/Nodes: No mediastinal or axillary lymphadenopathy. A nonspecific prominent lymph nodes present at the right hilum. There is a stable hypodense lesion in the left lobe of the thyroid  gland measuring 1.9 cm. The trachea and esophagus are within normal limits. Lungs/Pleura: There is a small pleural effusion on the right a trace pleural effusion on the left. No pneumothorax is seen. Atelectasis is present bilaterally. Upper Abdomen: No acute abnormality. Cystic structures are present in the left upper quadrant, which may be renal in origin and unchanged from the prior exam. Musculoskeletal: Degenerative changes are present in the thoracic spine. No acute osseous abnormality is seen. Review of the MIP images confirms the above findings. IMPRESSION: 1. Bilateral pulmonary emboli,  moderate volume. No evidence of right heart strain. 2. Small left pleural effusion and trace right pleural effusion with atelectasis. 3. Distended pulmonary trunk suggesting underlying pulmonary artery hypertension. 4. Cardiomegaly with coronary artery calcifications. 5. Aortic atherosclerosis. Electronically Signed: By: Leita Birmingham M.D. On: 01/14/2024 15:49   DG Chest Port 1 View Result Date: 01/14/2024 CLINICAL DATA:  RIGHT anterior chest pain. EXAM: PORTABLE CHEST 1 VIEW COMPARISON:  None Available. FINDINGS: The heart size and mediastinal contours are within normal limits. Both lungs are clear. The visualized skeletal structures are unremarkable. Atrial appendage closure device noted IMPRESSION: Insert no acute Electronically Signed   By: Jackquline Boxer M.D.   On: 01/14/2024 14:14     Scheduled Meds:  atorvastatin   20 mg Oral Daily   levothyroxine   50 mcg Oral QAC breakfast   metoprolol  tartrate  12.5 mg Oral Q8H   oxybutynin   10 mg Oral QHS   pantoprazole   40 mg Oral BID AC   Continuous Infusions:  heparin  1,400 Units/hr (01/15/24 0800)     LOS: 0 days    Time spent:    Sigurd Pac, MD [facility]   01/15/2024, 10:04 AM

## 2024-01-15 NOTE — Telephone Encounter (Signed)

## 2024-01-15 NOTE — Progress Notes (Signed)
 ANTICOAGULATION CONSULT NOTE  Pharmacy Consult for Heparin  Indication: pulmonary embolus  No Known Allergies  Patient Measurements: Height: 5' 3 (160 cm) Weight: 92.9 kg (204 lb 14.4 oz) IBW/kg (Calculated) : 52.4 Heparin  Dosing Weight: 73.1 kg  Vital Signs: Temp: 99 F (37.2 C) (06/22 1915) Temp Source: Oral (06/23 0000) BP: 125/79 (06/23 0000) Pulse Rate: 115 (06/23 0000)  Labs: Recent Labs    01/12/24 1100 01/14/24 1116 01/14/24 2108 01/14/24 2233 01/15/24 0018  HGB 12.1 12.1  --   --   --   HCT 37.2 37.4  --   --   --   PLT 172 156  --   --   --   HEPARINUNFRC  --   --   --   --  0.43  CREATININE 1.24* 1.43*  --   --   --   TROPONINIHS  --   --  9 10  --     Estimated Creatinine Clearance: 32.8 mL/min (A) (by C-G formula based on SCr of 1.43 mg/dL (H)).  Assessment: 17 yof presenting with chest discomfort. History of PE no longer on eliquis  2/2 GIB. Heparin  per pharmacy consult placed for pulmonary embolus. CTA PE w/ bilateral PE no evidence of RHS. Patient is not on anticoagulation prior to arrival.  AM: heparin  level within goal on 1300 units/hr. Per RN, no signs/symptoms of bleeding. Last CBC stable  Goal of Therapy:  Heparin  level 0.3-0.7 units/ml Monitor platelets by anticoagulation protocol: Yes   Plan:  Increase heparin  infusion to 1400 units/hr to keep at goal Check confirmatory anti-Xa level in 8 hours and daily while on heparin  Continue to monitor H&H and platelets  Lynwood Poplar, PharmD, BCPS Clinical Pharmacist 01/15/2024 1:07 AM

## 2024-01-15 NOTE — Progress Notes (Signed)
 Heart Failure Navigator Progress Note  Assessed for Heart & Vascular TOC clinic readiness.  Patient does not meet criteria due to Last EF 50-55%, has a scheduled [facility] appointment on 02/07/2024. No HF TOC per Dr. Fairy. .   Navigator will sign off at this time.   Stephane Haddock, BSN, Scientist, clinical (histocompatibility and immunogenetics) Only

## 2024-01-15 NOTE — Consult Note (Signed)
 NAME:  [name] [name], MRN:  [id number], DOB:  Dec 04, 1940, LOS: 0 ADMISSION DATE:  01/14/2024, CONSULTATION DATE:  01/15/24 REFERRING MD:  TRH, CHIEF COMPLAINT:  chest pain SOB   History of Present Illness:  83 year old woman history of VTE in the past, history of atrial fibrillation status post watchman stop anticoagulation 07/2023 whom we are seeing for evaluation of possible submassive PE.  Presented with right-sided chest pain shortness of breath.  Relatively sudden onset.  Pleuritic in nature.  She was found to be in A-fib with RVR site a percent on room air.  Placed on oxygen .  CT PE protocol noted PE without evidence of RV strain.  BNP is elevated.  Troponin within normal limits.  TTE shows new reduced EF, worsened dilated left atrium, new RV dysfunction and newly elevated PASP.  We discussed at length given second VTE recommendation for lifelong anticoagulation.  We discussed treatment options.  Unclear if this is really submassive versus RV changes are chronic and developed over the last few months in the setting of new reduced EF and severe left atrial dilation.  Notably TEE 07/2023 showed normal RV function and normal estimated PASP/RVSP.  Pertinent  Medical History  Atrial fibrillation status post Watchman, history of iron deficiency anemia related to GI bleeding without clear source  Significant Hospital Events: Including procedures, antibiotic start and stop dates in addition to other pertinent events     Interim History / Subjective:    Objective    Blood pressure (!) 140/86, pulse (!) 104, temperature 98.5 F (36.9 C), temperature source Oral, resp. rate (!) 22, height 5' 3 (1.6 m), weight 92.9 kg, SpO2 91%.        Intake/Output Summary (Last 24 hours) at 01/15/2024 1610 Last data filed at 01/15/2024 1604 Gross per 24 hour  Intake 2192.28 ml  Output 950 ml  Net 1242.28 ml   Filed Weights   01/14/24 1015 01/14/24 1602 01/14/24 1915  Weight: 90.7 kg 90.7 kg 92.9 kg     Examination: General: Elderly, lying in bed HENT: Atraumatic normocephalic no scleral icterus Lungs: Normal work of breathing, on nasal cannula Cardiovascular: Tachycardic, irregularly irregular Abdomen: Nondistended Neuro: No deficits noted   Resolved problem list   Assessment and Plan   Submassive PE, presumed: With TTE findings of RV dysfunction elevated PASP new since 07/2023.  Curiously, new reduced EF and dilated left atrium, query whether this is developed over the last few months and not related to PE.  Unprovoked.  Second VTE.  Recommend lifelong anticoagulation. -- Recommend heparin  48-72 hours and if stable can transition to Eliquis  -- Recommend lifelong AC -- Recommend repeat TTE in 3 months to assess RV function -- If clinically worsens (worsened tachycardia or O2 requirement) then consider IR consult for advanced procedures  PCCM will sign off  Best Practice (right click and Reselect all SmartList Selections daily)   Per Primary  Labs   CBC: Recent Labs  Lab 01/12/24 1100 01/14/24 1116 01/15/24 0311  WBC 6.7 7.9 7.7  NEUTROABS 4.7  --   --   HGB 12.1 12.1 11.0*  HCT 37.2 37.4 35.1*  MCV 95.6 94.9 96.4  PLT 172 156 156    Basic Metabolic Panel: Recent Labs  Lab 01/12/24 1100 01/14/24 1116 01/14/24 2055 01/15/24 0311  NA 140 141  --  139  K 3.9 4.3  --  4.0  CL 110 106  --  112*  CO2 21* 22  --  21*  GLUCOSE 114*  110*  --  126*  BUN 40* 34*  --  22  CREATININE 1.24* 1.43*  --  1.07*  CALCIUM  11.0* 11.8*  --  10.4*  MG  --   --  2.1 2.2  PHOS  --   --  2.7 3.1   GFR: Estimated Creatinine Clearance: 43.9 mL/min (A) (by C-G formula based on SCr of 1.07 mg/dL (H)). Recent Labs  Lab 01/12/24 1100 01/14/24 1116 01/15/24 0311  WBC 6.7 7.9 7.7    Liver Function Tests: Recent Labs  Lab 01/12/24 1100 01/15/24 0311  AST 13* 9*  ALT 12 10  ALKPHOS 98 84  BILITOT 0.4 0.7  PROT 6.3* 5.7*  ALBUMIN  3.6 3.2*   No results for input(s):  LIPASE, AMYLASE in the last 168 hours. No results for input(s): AMMONIA in the last 168 hours.  ABG    Component Value Date/Time   PHART 7.150 (LL) 12/25/2020 0231   PCO2ART 47.8 12/25/2020 0231   PO2ART 149 (H) 12/25/2020 0231   HCO3 15.9 (L) 12/25/2020 0231   TCO2 22 01/27/2023 2304   ACIDBASEDEF 12.4 (H) 12/25/2020 0231   O2SAT 98.3 12/25/2020 0231     Coagulation Profile: No results for input(s): INR, PROTIME in the last 168 hours.  Cardiac Enzymes: No results for input(s): CKTOTAL, CKMB, CKMBINDEX, TROPONINI in the last 168 hours.  HbA1C: HB A1C (BAYER DCA - WAIVED)  Date/Time Value Ref Range Status  04/20/2023 10:26 AM 4.8 4.8 - 5.6 % Final    Comment:             Prediabetes: 5.7 - 6.4          Diabetes: >6.4          Glycemic control for adults with diabetes: <7.0   10/20/2022 09:39 AM 4.8 4.8 - 5.6 % Final    Comment:             Prediabetes: 5.7 - 6.4          Diabetes: >6.4          Glycemic control for adults with diabetes: <7.0     CBG: No results for input(s): GLUCAP in the last 168 hours.  Review of Systems:   No orthopnea or PND. Comprehensive ROS otherwise negative.   Past Medical History:  She,  has a past medical history of Anemia, Arthritis, Atrial fibrillation (HCC), Basal cell carcinoma (BCC) of right hand (10/2021), Blood transfusion without reported diagnosis (2021), Chronic kidney disease, DVT (deep venous thrombosis) (HCC), Dyspnea, Dysrhythmia, GERD (gastroesophageal reflux disease), Headache(784.0), History of diabetes mellitus, History of kidney stones, Hypertension, Hypothyroidism, Presence of Watchman left atrial appendage closure device (06/15/2023), and Pulmonary embolism (HCC).   Surgical History:   Past Surgical History:  Procedure Laterality Date   ABDOMINAL HYSTERECTOMY     BILATERAL HIP ARTHROSCOPY Left    BIOPSY  12/08/2019   Procedure: BIOPSY;  Surgeon: Shaaron Lamar HERO, MD;  Location: [facility];   Service: Endoscopy;;   CATARACT EXTRACTION, BILATERAL     COLONOSCOPY N/A 12/08/2019   polyps (tubular adenoma), diverticulosis, colonic lipoma, no surveillance due to age   CYSTOSCOPY W/ URETERAL STENT PLACEMENT Right 12/25/2020   Procedure: CYSTOSCOPY WITH RETROGRADE PYELOGRAM/URETERAL STENT PLACEMENT;  Surgeon: Selma Donnice SAUNDERS, MD;  Location: [facility];  Service: Urology;  Laterality: Right;   CYSTOSCOPY/URETEROSCOPY/HOLMIUM LASER/STENT PLACEMENT Right 01/15/2021   Procedure: CYSTOSCOPY/RETROGRADE/URETEROSCOPY/HOLMIUM LASER/STENT EXCHANGE;  Surgeon: Selma Donnice SAUNDERS, MD;  Location: [facility];  Service: Urology;  Laterality: Right;  ESOPHAGOGASTRODUODENOSCOPY N/A 12/08/2019   normal esophagus with 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   ESOPHAGOGASTRODUODENOSCOPY (EGD) WITH PROPOFOL  N/A 05/18/2021   Surgeon: Rosalie Kitchens, MD;   Tiny hiatal hernia, gastric antral vascular ectasia s/p ablation, normal duodenum, normal jejunum.   ESOPHAGOGASTRODUODENOSCOPY (EGD) WITH PROPOFOL  N/A 03/10/2022   Procedure: ESOPHAGOGASTRODUODENOSCOPY (EGD) WITH PROPOFOL ;  Surgeon: Cindie Carlin POUR, DO;  [facility]: [facility];  Service: Endoscopy;  Laterality: N/A;  10:45am   ESOPHAGOGASTRODUODENOSCOPY (EGD) WITH PROPOFOL  N/A 02/01/2023   Procedure: ESOPHAGOGASTRODUODENOSCOPY (EGD) WITH PROPOFOL ;  Surgeon: Cindie Carlin POUR, DO;  [facility]: [facility];  Service: Endoscopy;  Laterality: N/A;  9:00 am, asa 3   EYE SURGERY  2015   Cataracts   GIVENS CAPSULE STUDY N/A 01/13/2020   Procedure: GIVENS CAPSULE STUDY;  Surgeon: Shaaron Lamar HERO, MD;  [facility]: [facility];  Service: Endoscopy;  Laterality: N/A;  7:30am   GIVENS CAPSULE STUDY N/A 05/04/2021   Procedure: GIVENS CAPSULE STUDY;  Surgeon: Rosalie Kitchens, MD;  Location: [facility];  Service: Endoscopy;  Laterality: N/A;   JOINT REPLACEMENT  2012 2015   Hips   LEFT ATRIAL APPENDAGE OCCLUSION N/A 06/15/2023   Procedure: LEFT ATRIAL  APPENDAGE OCCLUSION;  Surgeon: Cindie Ole DASEN, MD;  Location: [facility];  Service: Cardiovascular;  Laterality: N/A;   MULTIPLE TOOTH EXTRACTIONS     60's   PARATHYROIDECTOMY     POLYPECTOMY  12/08/2019   Procedure: POLYPECTOMY;  Surgeon: Shaaron Lamar HERO, MD;  [facility]: [facility];  Service: Endoscopy;;   POLYPECTOMY  02/01/2023   Procedure: POLYPECTOMY;  Surgeon: Cindie Carlin POUR, DO;  [facility]: [facility];  Service: Endoscopy;;   RADIOFREQUENCY ABLATION  05/18/2021   Procedure: RADIO FREQUENCY ABLATION;  Surgeon: Rosalie Kitchens, MD;  Location: [facility];  Service: Endoscopy;;   TEE WITHOUT CARDIOVERSION N/A 07/06/2021   Procedure: TRANSESOPHAGEAL ECHOCARDIOGRAM (TEE);  Surgeon: Kate Lonni CROME, MD;  Location[facility];  Service: Cardiovascular;  Laterality: N/A;   TEE WITHOUT CARDIOVERSION N/A 06/15/2023   Procedure: TRANSESOPHAGEAL ECHOCARDIOGRAM;  Surgeon: Cindie Ole DASEN, MD;  Location: [facility] INVAS[facility] CV LAB;  Service: Cardiovascular;  Laterality: N/A;   TOTAL HIP ARTHROPLASTY Right 10/29/2013   Procedure: RIGHT TOTAL HIP ARTHROPLASTY ANTERIOR APPROACH;  Surgeon: Donnice JONETTA Car, MD;  Location: [facility];  Service: Orthopedics;  Laterality: Right;   TRANSESOPHAGEAL ECHOCARDIOGRAM (CATH LAB) N/A 08/18/2023   Procedure: TRANSESOPHAGEAL ECHOCARDIOGRAM;  Surgeon: Francyne Headland, MD;  Location: [facility];  Service: Cardiovascular;  Laterality: N/A;   TUBAL LIGATION  1972     Social History:   reports that she has never smoked. She has never used smokeless tobacco. She reports that she does not drink alcohol  and does not use drugs.   Family History:  Her family history includes Cancer (age of onset: 57) in her sister; Cancer (age of onset: 34) in her mother; Colitis (age of onset: 71) in her sister; Healthy in her daughter, son, son, and son; Heart attack (age of onset: 12) in her brother; Heart disease in her brother; Heart disease (age of onset: 78) in  her father; Pulmonary embolism (age of onset: 67) in her brother.   Allergies No Known Allergies   Home Medications  Prior to Admission medications   Medication Sig Start Date End Date Taking? Authorizing Provider  acetaminophen  (TYLENOL ) 325 MG tablet Take 2 tablets (650 mg total) by mouth every 6 (six) hours as needed for mild pain or headache (  or Fever >/= 101). 04/04/19  Yes Emokpae, Courage, MD  allopurinol  (ZYLOPRIM ) 100 MG tablet Take 1 tablet (100 mg total) by mouth daily. 11/24/23  Yes Joesph Annabella HERO, FNP  amLODipine  (NORVASC ) 5 MG tablet Take 1 tablet (5 mg total) by mouth daily. 04/05/23  Yes Lavona Agent, MD  atorvastatin  (LIPITOR) 20 MG tablet Take 1 tablet (20 mg total) by mouth daily. 11/24/23  Yes Joesph Annabella HERO, FNP  cetirizine  (ZYRTEC ) 10 MG tablet Take 10 mg by mouth daily.   Yes [provider]  dapagliflozin  propanediol (FARXIGA ) 10 MG TABS tablet Take 1 tablet (10 mg total) by mouth daily. 09/22/23  Yes Joesph Annabella HERO, FNP  ferrous sulfate  325 (65 FE) MG EC tablet Take 325 mg by mouth daily.   Yes [provider]  furosemide  (LASIX ) 20 MG tablet Take 1 tablet by mouth once daily 09/14/23  Yes Hochrein, Agent, MD  [facility] CRANBERRY EXTRACT PO Take 1 tablet by mouth in the morning.   Yes [provider]  levothyroxine  (SYNTHROID ) 50 MCG tablet Take 1 tablet (50 mcg total) by mouth daily before breakfast. 11/24/23  Yes Joesph Annabella HERO, FNP  losartan  (COZAAR ) 25 MG tablet Take 1 tablet (25 mg total) by mouth daily. Patient taking differently: Take 25 mg by mouth every evening. 11/24/23  Yes Joesph Annabella HERO, FNP  metoprolol  succinate (TOPROL  XL) 25 MG 24 hr tablet Take 1 tablet (25 mg total) by mouth daily. 09/26/23  Yes Sebastian Lamarr SAUNDERS, PA-C  oxybutynin  (DITROPAN -XL) 10 MG 24 hr tablet Take 1 tablet (10 mg total) by mouth at bedtime. 11/24/23  Yes Joesph Annabella HERO, FNP  pantoprazole  (PROTONIX ) 40 MG tablet Take 1 tablet (40 mg total) by mouth 2  (two) times daily before a meal. 11/24/23  Yes Joesph Annabella HERO, FNP     Critical care time: n/a    Donnice SAUNDERS Beals, MD See Tracey

## 2024-01-15 NOTE — Consult Note (Addendum)
 Cardiology Consultation   Patient ID: [name] [name] MRN: [id number]; DOB: 1941-02-15  Admit date: 01/14/2024 Date of Consult: 01/15/2024  PCP:  Joesph Annabella HERO, FNP    [facility] Providers Cardiologist:  Lynwood Schilling, MD  Cardiology APP:  Jerilynn Lamarr HERO, NP       Patient Profile: [name] [name] is a 83 y.o. female with a hx of HFimpEF, mitral valve disease, hypertension, DVT/PE 7/21, history of GI bleed, iron deficiency anemia, CKD stage III and history of persistent atrial fibrillation s/p Watchman device 06/15/2023 who is being seen 01/15/2024 for the evaluation of afib with RVR at the request of Dr. Fairy.  History of Present Illness: [name] [name] is a 83 year old female with past medical history of HFimpEF, mitral valve disease, hypertension, DVT/PE 03/2019, history of GI bleed, iron deficiency anemia, CKD stage III and history of persistent atrial fibrillation s/p Watchman device 06/15/2023.  Echocardiogram in July 2022 showed EF 35 to 40%, however ejection fraction quickly recovered back to 55 to 60% on repeat echocardiogram 2 months later.  Due to history of GI bleed and iron deficiency anemia, she ultimately underwent watchman implantation on 06/15/2023.  Her Eliquis  2.5 mg twice a day dosing was stopped 45 days after the watchman implant and she was switched to Plavix .  She underwent repeat TEE on 08/18/2023 that showed excellent device position with no leak or thrombus.  She was noted to be back in persistent atrial fibrillation in January, the last time she was in sinus rhythm was in November 2024.  Rate control strategy was pursued and a low-dose metoprolol  was added to her medical regimen.  She presented back to the hospital yesterday with complaint of right-sided chest discomfort reminiscent of the previous PE.  She was noted to be in A-fib with RVR with heart rate in the 120-150s on arrival.  O2 saturation 89%.  BNP 458.  Serial troponin 9--> 10. D-dimer  elevated at 6.7.  Urinalysis showed greater than 1000 glucose, moderate leukocyte, trace protein.  CTA of the chest showed bilateral pulmonary emboli, moderate volume, no evidence of right heart strain.  Small left pleural effusion, trace right pleural effusion.  Dilated pulmonary trunk. Lower extremity venous Doppler is currently pending.  Cardiology service consulted for persistent atrial fibrillation with RVR.  Her long-acting metoprolol  succinate 25 mg daily was switched to short acting metoprolol  tartrate 12.5 mg every 8 hours.  Heart rate is now coming down to the 90s.   Past Medical History:  Diagnosis Date   Anemia    Arthritis    Ostearthritis- hips, knees, fingers   Atrial fibrillation (HCC)    Basal cell carcinoma (BCC) of right hand 10/2021   Dx by [facility]   Blood transfusion without reported diagnosis 2021   Chronic kidney disease    DVT (deep venous thrombosis) (HCC)    Dyspnea    Dysrhythmia    GERD (gastroesophageal reflux disease)    Headache(784.0)    tx. Valproic  acid   History of diabetes mellitus    History of kidney stones    Hypertension    Hypothyroidism    Presence of Watchman left atrial appendage closure device 06/15/2023   24mm Watchman FLX Pro device placed by Dr. Cindie   Pulmonary embolism [facility])     Past Surgical History:  Procedure Laterality Date   ABDOMINAL HYSTERECTOMY     BILATERAL HIP ARTHROSCOPY Left    BIOPSY  12/08/2019   Procedure: BIOPSY;  Surgeon: Shaaron Lamar HERO, MD;  [facility];  Service: Endoscopy;;   CATARACT EXTRACTION, BILATERAL     COLONOSCOPY N/A 12/08/2019   polyps (tubular adenoma), diverticulosis, colonic lipoma, no surveillance due to age   CYSTOSCOPY W/ URETERAL STENT PLACEMENT Right 12/25/2020   Procedure: CYSTOSCOPY WITH RETROGRADE PYELOGRAM/URETERAL STENT PLACEMENT;  Surgeon: Selma Donnice SAUNDERS, MD;  Location: [facility];  Service: Urology;  Laterality: Right;   CYSTOSCOPY/URETEROSCOPY/HOLMIUM LASER/STENT  PLACEMENT Right 01/15/2021   Procedure: CYSTOSCOPY/RETROGRADE/URETEROSCOPY/HOLMIUM LASER/STENT EXCHANGE;  Surgeon: Selma Donnice SAUNDERS, MD;  Location: [facility];  Service: Urology;  Laterality: Right;   ESOPHAGOGASTRODUODENOSCOPY N/A 12/08/2019   normal esophagus with 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   ESOPHAGOGASTRODUODENOSCOPY (EGD) WITH PROPOFOL  N/A 05/18/2021   Surgeon: Rosalie Kitchens, MD;   Tiny hiatal hernia, gastric antral vascular ectasia s/p ablation, normal duodenum, normal jejunum.   ESOPHAGOGASTRODUODENOSCOPY (EGD) WITH PROPOFOL  N/A 03/10/2022   Procedure: ESOPHAGOGASTRODUODENOSCOPY (EGD) WITH PROPOFOL ;  Surgeon: Cindie Carlin POUR, DO;  [facility];  Service: Endoscopy;  Laterality: N/A;  10:45am   ESOPHAGOGASTRODUODENOSCOPY (EGD) WITH PROPOFOL  N/A 02/01/2023   Procedure: ESOPHAGOGASTRODUODENOSCOPY (EGD) WITH PROPOFOL ;  Surgeon: Cindie Carlin POUR, DO;  [facility];  Service: Endoscopy;  Laterality: N/A;  9:00 am, asa 3   EYE SURGERY  2015   Cataracts   GIVENS CAPSULE STUDY N/A 01/13/2020   Procedure: GIVENS CAPSULE STUDY;  Surgeon: Shaaron Lamar HERO, MD;  [facility];  Service: Endoscopy;  Laterality: N/A;  7:30am   GIVENS CAPSULE STUDY N/A 05/04/2021   Procedure: GIVENS CAPSULE STUDY;  Surgeon: Rosalie Kitchens, MD;  [facility];  Service: Endoscopy;  Laterality: N/A;   JOINT REPLACEMENT  2012 2015   Hips   LEFT ATRIAL APPENDAGE OCCLUSION N/A 06/15/2023   Procedure: LEFT ATRIAL APPENDAGE OCCLUSION;  Surgeon: Cindie Ole DASEN, MD;  [facility]: [facility];  Service: Cardiovascular;  Laterality: N/A;   MULTIPLE TOOTH EXTRACTIONS     60's   PARATHYROIDECTOMY     POLYPECTOMY  12/08/2019   Procedure: POLYPECTOMY;  Surgeon: Shaaron Lamar HERO, MD;  [facility];  Service: Endoscopy;;   POLYPECTOMY  02/01/2023   Procedure: POLYPECTOMY;  Surgeon: Cindie Carlin POUR, DO;  [facility];   Service: Endoscopy;;   RADIOFREQUENCY ABLATION  05/18/2021   Procedure: RADIO FREQUENCY ABLATION;  Surgeon: Rosalie Kitchens, MD;  [facility];  Service: Endoscopy;;   TEE WITHOUT CARDIOVERSION N/A 07/06/2021   Procedure: TRANSESOPHAGEAL ECHOCARDIOGRAM (TEE);  Surgeon: Kate Lonni CROME, MD;  Location[facility] ENDOSCOPY;  Service: Cardiovascular;  Laterality: N/A;   TEE WITHOUT CARDIOVERSION N/A 06/15/2023   Procedure: TRANSESOPHAGEAL ECHOCARDIOGRAM;  Surgeon: Cindie Ole DASEN, MD;  Location: [facility] INVASIVE CV LAB;  Service: Cardiovascular;  Laterality: N/A;   TOTAL HIP ARTHROPLASTY Right 10/29/2013   Procedure: RIGHT TOTAL HIP ARTHROPLASTY ANTERIOR APPROACH;  Surgeon: Donnice JONETTA Car, MD;  Location: [facility];  Service: Orthopedics;  Laterality: Right;   TRANSESOPHAGEAL ECHOCARDIOGRAM (CATH LAB) N/A 08/18/2023   Procedure: TRANSESOPHAGEAL ECHOCARDIOGRAM;  Surgeon: Francyne Headland, MD;  [facility];  Service: Cardiovascular;  Laterality: N/A;   TUBAL LIGATION  1972     Home Medications:  Prior to Admission medications   Medication Sig Start Date End Date Taking? Authorizing Provider  acetaminophen  (TYLENOL ) 325 MG tablet Take 2 tablets (650 mg total) by mouth every 6 (six) hours as needed for mild pain or headache (or Fever >/= 101). 04/04/19  Yes Pearlean Manus, MD  allopurinol  (ZYLOPRIM )  100 MG tablet Take 1 tablet (100 mg total) by mouth daily. 11/24/23  Yes Joesph Annabella HERO, FNP  amLODipine  (NORVASC ) 5 MG tablet Take 1 tablet (5 mg total) by mouth daily. 04/05/23  Yes Lavona Agent, MD  atorvastatin  (LIPITOR) 20 MG tablet Take 1 tablet (20 mg total) by mouth daily. 11/24/23  Yes Joesph Annabella HERO, FNP  cetirizine  (ZYRTEC ) 10 MG tablet Take 10 mg by mouth daily.   Yes [provider]  dapagliflozin  propanediol (FARXIGA ) 10 MG TABS tablet Take 1 tablet (10 mg total) by mouth daily. 09/22/23  Yes Joesph Annabella HERO, FNP  ferrous sulfate  325 (65 FE) MG EC tablet Take  325 mg by mouth daily.   Yes [provider]  furosemide  (LASIX ) 20 MG tablet Take 1 tablet by mouth once daily 09/14/23  Yes Layli Capshaw, Agent, MD  [facility] CRANBERRY EXTRACT PO Take 1 tablet by mouth in the morning.   Yes [provider]  levothyroxine  (SYNTHROID ) 50 MCG tablet Take 1 tablet (50 mcg total) by mouth daily before breakfast. 11/24/23  Yes Joesph Annabella HERO, FNP  losartan  (COZAAR ) 25 MG tablet Take 1 tablet (25 mg total) by mouth daily. Patient taking differently: Take 25 mg by mouth every evening. 11/24/23  Yes Joesph Annabella HERO, FNP  metoprolol  succinate (TOPROL  XL) 25 MG 24 hr tablet Take 1 tablet (25 mg total) by mouth daily. 09/26/23  Yes Sebastian Lamarr SAUNDERS, PA-C  oxybutynin  (DITROPAN -XL) 10 MG 24 hr tablet Take 1 tablet (10 mg total) by mouth at bedtime. 11/24/23  Yes Joesph Annabella HERO, FNP  pantoprazole  (PROTONIX ) 40 MG tablet Take 1 tablet (40 mg total) by mouth 2 (two) times daily before a meal. 11/24/23  Yes Joesph Annabella HERO, FNP    Scheduled Meds:  atorvastatin   20 mg Oral Daily   furosemide   40 mg Intravenous Once   levothyroxine   50 mcg Oral QAC breakfast   metoprolol  tartrate  12.5 mg Oral Q8H   oxybutynin   10 mg Oral QHS   pantoprazole   40 mg Oral BID AC   Continuous Infusions:  heparin  1,400 Units/hr (01/15/24 0800)   PRN Meds: acetaminophen  **OR** acetaminophen , fentaNYL  (SUBLIMAZE ) injection, HYDROcodone -acetaminophen , ondansetron  **OR** ondansetron  (ZOFRAN ) IV, mouth rinse  Allergies:   No Known Allergies  Social History:   Social History   Socioeconomic History   Marital status: Widowed    Spouse name: Not on file   Number of children: 4   Years of education: 74   Highest education level: 12th grade  Occupational History   Occupation: Retired    Comment: Tobacco Farming  Tobacco Use   Smoking status: Never   Smokeless tobacco: Never  Vaping Use   Vaping status: Never Used  Substance and Sexual Activity   Alcohol  use: No   Drug use: No    Sexual activity: Not Currently    Birth control/protection: None  Other Topics Concern   Not on file  Social History Narrative   Patient is widowed and lives in a one story home. She has four adult children and one son lives with her.       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   Social Drivers of Corporate investment banker Strain: Low Risk  (11/23/2023)   Overall Financial Resource Strain (CARDIA)    Difficulty of Paying Living Expenses: Not very hard  Food Insecurity: No Food Insecurity (11/23/2023)   Hunger Vital Sign    Worried About Running Out of Food  in the Last Year: Never true    Ran Out of Food in the Last Year: Never true  Transportation Needs: No Transportation Needs (11/23/2023)   PRAPARE - Administrator, Civil Service (Medical): No    Lack of Transportation (Non-Medical): No  Physical Activity: Insufficiently Active (11/23/2023)   Exercise Vital Sign    Days of Exercise per Week: 1 day    Minutes of Exercise per Session: 10 min  Stress: No Stress Concern Present (11/23/2023)   [employer] of Occupational Health - Occupational Stress Questionnaire    Feeling of Stress : Not at all  Social Connections: Unknown (11/23/2023)   Social Connection and Isolation Panel    Frequency of Communication with Friends and Family: More than three times a week    Frequency of Social Gatherings with Friends and Family: Twice a week    Attends Religious Services: Patient declined    Database administrator or Organizations: No    Attends Engineer, structural: Not on file    Marital Status: Widowed  Intimate Partner Violence: Not At Risk (12/28/2023)   Humiliation, Afraid, Rape, and Kick questionnaire    Fear of Current or Ex-Partner: No    Emotionally Abused: No    Physically Abused: No    Sexually Abused: No    Family History:    Family History  Problem Relation Age of Onset   Colitis Sister 44       alive   Cancer Sister 35       colon   Cancer Mother 43        uterine   Heart disease Father 79       heart failure   Heart attack Brother 46   Healthy Daughter    Healthy Son    Pulmonary embolism Brother 46   Heart disease Brother    Healthy Son    Healthy Son      ROS:  Please see the history of present illness.   All other ROS reviewed and negative.     Physical Exam/Data: Vitals:   01/15/24 0200 01/15/24 0300 01/15/24 0400 01/15/24 0722  BP: 119/76 125/80 122/70 118/66  Pulse: (!) 133 100 97 92  Resp: (!) 21 (!) 25 19 20   Temp:   98.8 F (37.1 C) 98.5 F (36.9 C)  TempSrc:   Oral Oral  SpO2: 93% 91% 91% 94%  Weight:      Height:        Intake/Output Summary (Last 24 hours) at 01/15/2024 1014 Last data filed at 01/15/2024 0830 Gross per 24 hour  Intake 1952.28 ml  Output --  Net 1952.28 ml      01/14/2024    7:15 PM 01/14/2024    4:02 PM 01/14/2024   10:15 AM  Last 3 Weights  Weight (lbs) 204 lb 14.4 oz 199 lb 15.3 oz 200 lb  Weight (kg) 92.942 kg 90.7 kg 90.719 kg     Body mass index is 36.3 kg/m.  General:  Well nourished, well developed, in no acute distress HEENT: normal Neck: no JVD Vascular: No carotid bruits; Distal pulses 2+ bilaterally Cardiac:  irregularly irregular; no murmur  Lungs:  clear to auscultation bilaterally, no wheezing, rhonchi or rales  Abd: soft, nontender, no hepatomegaly  Ext: no edema Musculoskeletal:  No deformities, BUE and BLE strength normal and equal Skin: warm and dry  Neuro:  CNs 2-12 intact, no focal abnormalities noted Psych:  Normal affect   EKG:  The EKG was personally reviewed and demonstrates: Atrial fibrillation, no significant ST-T wave changes. Telemetry:  Telemetry was personally reviewed and demonstrates: Atrial fibrillation, heart rate was in the 120-150s on initial arrival, has since settled down to the 80s and 90s.  Relevant CV Studies:  Echo 08/18/2023  1. Left ventricular ejection fraction, by estimation, is 50 to 55%. The  left ventricle has low normal  function. The left ventricle has no regional  wall motion abnormalities.   2. Right ventricular systolic function is normal. The right ventricular  size is normal. There is normal pulmonary artery systolic pressure. The  estimated right ventricular systolic pressure is 22.4 mmHg.   3. There is a well seated Watchman device in the left atrial appendage  with excellent apposition and no evidence of leak. Left atrial size was  moderately dilated. No left atrial/left atrial appendage thrombus was  detected.   4. The mitral valve is normal in structure. No evidence of mitral valve  regurgitation. No evidence of mitral stenosis.   5. The aortic valve is normal in structure. Aortic valve regurgitation is  not visualized. No aortic stenosis is present.   6. The inferior vena cava is normal in size with greater than 50%  respiratory variability, suggesting right atrial pressure of 3 mmHg.   7. Rhythm strip during this exam demonstrates atrial fibrillation. There  is rapid ventricular response (max 151 bpm, average 115 bpm).   Laboratory Data: High Sensitivity Troponin:   Recent Labs  Lab 01/14/24 2108 01/14/24 2233  TROPONINIHS 9 10     Chemistry Recent Labs  Lab 01/12/24 1100 01/14/24 1116 01/14/24 2055 01/15/24 0311  NA 140 141  --  139  K 3.9 4.3  --  4.0  CL 110 106  --  112*  CO2 21* 22  --  21*  GLUCOSE 114* 110*  --  126*  BUN 40* 34*  --  22  CREATININE 1.24* 1.43*  --  1.07*  CALCIUM  11.0* 11.8*  --  10.4*  MG  --   --  2.1 2.2  GFRNONAA 43* 36*  --  52*  ANIONGAP 9 14  --  6    Recent Labs  Lab 01/12/24 1100 01/15/24 0311  PROT 6.3* 5.7*  ALBUMIN  3.6 3.2*  AST 13* 9*  ALT 12 10  ALKPHOS 98 84  BILITOT 0.4 0.7   Lipids No results for input(s): CHOL, TRIG, HDL, LABVLDL, LDLCALC, CHOLHDL in the last 168 hours.  Hematology Recent Labs  Lab 01/12/24 1100 01/14/24 1116 01/15/24 0311  WBC 6.7 7.9 7.7  RBC 3.89 3.94 3.64*  HGB 12.1 12.1 11.0*   HCT 37.2 37.4 35.1*  MCV 95.6 94.9 96.4  MCH 31.1 30.7 30.2  MCHC 32.5 32.4 31.3  RDW 13.8 13.9 13.8  PLT 172 156 156   Thyroid   Recent Labs  Lab 01/14/24 2055  TSH 2.297    BNP Recent Labs  Lab 01/14/24 2108  BNP 458.3*    DDimer  Recent Labs  Lab 01/14/24 1116  DDIMER 6.73*    Radiology/Studies:  VAS US  LOWER EXTREMITY VENOUS (DVT) Result Date: 01/15/2024  Lower Venous DVT Study Patient Name:  LUGENE HITT  Date of Exam:   01/15/2024 Medical Rec #: [id number]            Accession #:    [id number] Date of Birth: Jun 20, 1941            Patient Gender: F Patient Age:   24  years Exam Location:  [facility] Procedure:      VAS US  LOWER EXTREMITY VENOUS (DVT) Referring Phys: ANASTASSIA DOUTOVA --------------------------------------------------------------------------------  Indications: Pulmonary embolism.  Risk Factors: DVT Hx of BLE DVT in 2020. Performing Technologist: Elmarie Crump,RVT  Examination Guidelines: A complete evaluation includes B-mode imaging, spectral Doppler, color Doppler, and power Doppler as needed of all accessible portions of each vessel. Bilateral testing is considered an integral part of a complete examination. Limited examinations for reoccurring indications may be performed as noted. The reflux portion of the exam is performed with the patient in reverse Trendelenburg.  +---------+---------------+---------+-----------+----------+-------------------+ RIGHT    CompressibilityPhasicitySpontaneityPropertiesThrombus Aging      +---------+---------------+---------+-----------+----------+-------------------+ CFV      Partial        Yes      Yes                                      +---------+---------------+---------+-----------+----------+-------------------+ SFJ      Full                                                             +---------+---------------+---------+-----------+----------+-------------------+ FV Prox  Full                                                              +---------+---------------+---------+-----------+----------+-------------------+ FV Mid   Full                                                             +---------+---------------+---------+-----------+----------+-------------------+ FV DistalFull                                                             +---------+---------------+---------+-----------+----------+-------------------+ PFV      Full                                                             +---------+---------------+---------+-----------+----------+-------------------+ POP      Full           Yes      Yes                                      +---------+---------------+---------+-----------+----------+-------------------+ PTV      Full                                                             +---------+---------------+---------+-----------+----------+-------------------+  PERO                                                  Not well visualized +---------+---------------+---------+-----------+----------+-------------------+ There is free floating thrombus that appears to be attached to a valve in the CFV. The peroneal veins are not seen well.  +---------+---------------+---------+-----------+----------+-------------------+ LEFT     CompressibilityPhasicitySpontaneityPropertiesThrombus Aging      +---------+---------------+---------+-----------+----------+-------------------+ CFV      Full           Yes      Yes                                      +---------+---------------+---------+-----------+----------+-------------------+ SFJ      Full                                                             +---------+---------------+---------+-----------+----------+-------------------+ FV Prox  Full                                                              +---------+---------------+---------+-----------+----------+-------------------+ FV Mid   Partial                                                          +---------+---------------+---------+-----------+----------+-------------------+ FV Distal                                             Not well visualized +---------+---------------+---------+-----------+----------+-------------------+ PFV      Full                                                             +---------+---------------+---------+-----------+----------+-------------------+ POP      None                                                             +---------+---------------+---------+-----------+----------+-------------------+ PTV      Full                                                             +---------+---------------+---------+-----------+----------+-------------------+  PERO                                                  Not well visualized +---------+---------------+---------+-----------+----------+-------------------+ Acute appearing, partially compressible thrombus in the mid femoral vein and noncompresssible thrombus in the popliteal vein. The distal femoral and peroneal veins are not seen well.   Summary: RIGHT: - Findings consistent with acute deep vein thrombosis involving the right common femoral vein.  - No cystic structure found in the popliteal fossa.  LEFT: - Findings consistent with acute deep vein thrombosis involving the left femoral vein, and left popliteal vein.  - The positive findings were reported to Dr. Blease Quiver, MD  *See table(s) above for measurements and observations.    Preliminary    CT Angio Chest PE W/Cm &/Or Wo Cm Addendum Date: 01/14/2024 ADDENDUM REPORT: 01/14/2024 15:54 ADDENDUM: Critical Value/emergent results were called by telephone at the time of interpretation on 01/14/2024 at 3:53 pm to provider Dr. Neysa, who verbally acknowledged these results.  Electronically Signed   By: Leita Birmingham M.D.   On: 01/14/2024 15:54   Result Date: 01/14/2024 CLINICAL DATA:  Pulmonary embolism suspected, low to intermediate probability. Positive D-dimer. EXAM: CT ANGIOGRAPHY CHEST WITH CONTRAST TECHNIQUE: Multidetector CT imaging of the chest was performed using the standard protocol during bolus administration of intravenous contrast. Multiplanar CT image reconstructions and MIPs were obtained to evaluate the vascular anatomy. RADIATION DOSE REDUCTION: This exam was performed according to the departmental dose-optimization program which includes automated exposure control, adjustment of the mA and/or kV according to patient size and/or use of iterative reconstruction technique. CONTRAST:  50mL OMNIPAQUE  IOHEXOL  350 MG/ML SOLN COMPARISON:  04/03/2019. FINDINGS: Cardiovascular: The heart is enlarged and there is a trace pericardial effusion. Scattered coronary artery calcifications are present. An occlusion device is present in the left atrial appendage. There is atherosclerotic calcification of the aorta without evidence of aneurysm. The pulmonary trunk is distended suggesting underlying pulmonary artery hypertension. Pulmonary artery filling defects are present in the right pulmonary artery extending into the lobar, segmental, of subsegmental arteries in the right upper, middle, and lower lobes. There are segmental and subsegmental pulmonary emboli in the left upper and lower lobes. There is no evidence of right heart strain. Mediastinum/Nodes: No mediastinal or axillary lymphadenopathy. A nonspecific prominent lymph nodes present at the right hilum. There is a stable hypodense lesion in the left lobe of the thyroid  gland measuring 1.9 cm. The trachea and esophagus are within normal limits. Lungs/Pleura: There is a small pleural effusion on the right a trace pleural effusion on the left. No pneumothorax is seen. Atelectasis is present bilaterally. Upper Abdomen: No acute  abnormality. Cystic structures are present in the left upper quadrant, which may be renal in origin and unchanged from the prior exam. Musculoskeletal: Degenerative changes are present in the thoracic spine. No acute osseous abnormality is seen. Review of the MIP images confirms the above findings. IMPRESSION: 1. Bilateral pulmonary emboli, moderate volume. No evidence of right heart strain. 2. Small left pleural effusion and trace right pleural effusion with atelectasis. 3. Distended pulmonary trunk suggesting underlying pulmonary artery hypertension. 4. Cardiomegaly with coronary artery calcifications. 5. Aortic atherosclerosis. Electronically Signed: By: Leita Birmingham M.D. On: 01/14/2024 15:49   DG Chest Port 1 View Result Date: 01/14/2024 CLINICAL DATA:  RIGHT anterior chest pain. EXAM: PORTABLE  CHEST 1 VIEW COMPARISON:  None Available. FINDINGS: The heart size and mediastinal contours are within normal limits. Both lungs are clear. The visualized skeletal structures are unremarkable. Atrial appendage closure device noted IMPRESSION: Insert no acute Electronically Signed   By: Jackquline Boxer M.D.   On: 01/14/2024 14:14     Assessment and Plan: Persistent atrial fibrillation with RVR   - Likely reactive to PE.  Previously was in rate controlled atrial fibrillation on metoprolol  succinate 25 mg daily.  Switch to 12.5 mg 3 times daily metoprolol  tartrate, will increase metoprolol  tartrate to 25 mg twice a day. HR goal between 80-110s. Given prior Watchman procedure, no current plan for cardioversion.  Acute PE: This is her second DVT/PE since 03/2019.  CT showed moderate clot burden, per verbal report, she had LLE DVT on US  earlier, awaiting final report. Unfortunately, that means despite her Watchman procedure, she will need lifelong anticoagulation therapy.  She will need DVT/PE dosing of Eliquis  for 1 week before transitioning to 5 mg twice a day. 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  Repeat echocardiogram for right heart strain. Euvolemic on exam.   History of GI bleed/iron deficiency anemia  Hypertension  CKD stage III: Renal function stable.   Risk Assessment/Risk Scores:     CHA2DS2-VASc Score = 4   This indicates a 4.8% annual risk of stroke. The patient's score is based upon: CHF History: 0 HTN History: 1 Diabetes History: 0 Stroke History: 0 Vascular Disease History: 0 Age Score: 2 Gender Score: 1    For questions or updates, please contact [facility] Please consult [email or web address] for contact info under   Signed, Scot Ford, PA  01/15/2024 10:14 AM  History and all data above reviewed.  Patient examined.  I agree with the findings as above. The patient gets around her house with a walker.  She had been doing fine but did develop some left leg pain last week.  She had acute shortness of breath this weekend.  She had otherwise been doing okay.  She says her heart rate apically at home is a little bit below 100.  She does not really feel tachypalpitations at home.  She became acutely short of breath with sharp discomfort in her right lower ribs.  She has not had any cough fevers or chills.  She has not had any chest pressure, neck or arm discomfort.  Echo with EF 40 to 45%.  There is no evidence of RV strain.  He patient exam reveals RNM:Pmmzhlojm, distant heart sounds  ,  Lungs: Clear  ,  Abd: Positive bowel sounds, no rebound no guarding, Ext No edema  .  All available labs, radiology testing, previous records reviewed. Agree with documented assessment and plan.  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  She has not had any recent GI bleeding but we will follow this closely.   Cardiomyopathy: The ejection fraction had been low previously and then improved and now seems to be slightly reduced.  Again manages medically.  She has not been having any acute heart failure symptoms.  I will likely suggest titration of her Cozaar  and reduction of her amlodipine .  However, both on hold for  now.  Will titrate beta blocker for rate control.   I will titrate today and consolidate prior to discharge.  Lynwood Arlyce Circle  12:35 PM  01/15/2024

## 2024-01-15 NOTE — Plan of Care (Signed)

## 2024-01-16 ENCOUNTER — Encounter: Payer: Self-pay | Admitting: Hematology

## 2024-01-16 DIAGNOSIS — I4891 Unspecified atrial fibrillation: Secondary | ICD-10-CM | POA: Diagnosis not present

## 2024-01-16 DIAGNOSIS — I5021 Acute systolic (congestive) heart failure: Secondary | ICD-10-CM | POA: Diagnosis not present

## 2024-01-16 LAB — BASIC METABOLIC PANEL WITH GFR
Anion gap: 8 (ref 5–15)
BUN: 22 mg/dL (ref 8–23)
CO2: 22 mmol/L (ref 22–32)
Calcium: 10.5 mg/dL — ABNORMAL HIGH (ref 8.9–10.3)
Chloride: 109 mmol/L (ref 98–111)
Creatinine, Ser: 1.24 mg/dL — ABNORMAL HIGH (ref 0.44–1.00)
GFR, Estimated: 43 mL/min — ABNORMAL LOW (ref >60)
Glucose, Bld: 111 mg/dL — ABNORMAL HIGH (ref 70–99)
Potassium: 3.8 mmol/L (ref 3.5–5.1)
Sodium: 139 mmol/L (ref 135–145)

## 2024-01-16 LAB — CBC
HCT: 34.7 % — ABNORMAL LOW (ref 36.0–46.0)
Hemoglobin: 11.4 g/dL — ABNORMAL LOW (ref 12.0–15.0)
MCH: 31.4 pg (ref 26.0–34.0)
MCHC: 32.9 g/dL (ref 30.0–36.0)
MCV: 95.6 fL (ref 80.0–100.0)
Platelets: 169 10*3/uL (ref 150–400)
RBC: 3.63 MIL/uL — ABNORMAL LOW (ref 3.87–5.11)
RDW: 13.4 % (ref 11.5–15.5)
WBC: 8.2 10*3/uL (ref 4.0–10.5)
nRBC: 0 % (ref 0.0–0.2)

## 2024-01-16 LAB — HEPARIN LEVEL (UNFRACTIONATED): Heparin Unfractionated: 0.36 [IU]/mL (ref 0.30–0.70)

## 2024-01-16 MED ORDER — APIXABAN 5 MG PO TABS: 5.0000 mg | ORAL_TABLET | Freq: Two times a day (BID) | ORAL | Status: DC

## 2024-01-16 MED ORDER — DAPAGLIFLOZIN PROPANEDIOL 10 MG PO TABS
10.0000 mg | ORAL_TABLET | Freq: Every day | ORAL | Status: DC
  Administered 2024-01-16 – 2024-01-19 (×4): 10 mg via ORAL
  Filled 2024-01-16 (×4): qty 1

## 2024-01-16 MED ORDER — POTASSIUM CHLORIDE 20 MEQ PO PACK
40.0000 meq | PACK | Freq: Once | ORAL | Status: AC
  Administered 2024-01-16: 40 meq via ORAL
  Filled 2024-01-16: qty 2

## 2024-01-16 MED ORDER — METOPROLOL SUCCINATE ER 100 MG PO TB24
100.0000 mg | ORAL_TABLET | Freq: Every day | ORAL | Status: DC
  Administered 2024-01-16: 100 mg via ORAL
  Filled 2024-01-16: qty 1

## 2024-01-16 MED ORDER — APIXABAN 5 MG PO TABS
10.0000 mg | ORAL_TABLET | Freq: Two times a day (BID) | ORAL | Status: DC
  Administered 2024-01-16 – 2024-01-19 (×7): 10 mg via ORAL
  Filled 2024-01-16 (×7): qty 2

## 2024-01-16 MED ORDER — FUROSEMIDE 10 MG/ML IJ SOLN
40.0000 mg | Freq: Once | INTRAMUSCULAR | Status: AC
  Administered 2024-01-16: 40 mg via INTRAVENOUS
  Filled 2024-01-16: qty 4

## 2024-01-16 MED ORDER — POLYETHYLENE GLYCOL 3350 17 G PO PACK
17.0000 g | PACK | Freq: Every day | ORAL | Status: DC
  Administered 2024-01-16 – 2024-01-18 (×3): 17 g via ORAL
  Filled 2024-01-16 (×4): qty 1

## 2024-01-16 NOTE — Discharge Instructions (Addendum)
 Information on my medicine - ELIQUIS  (apixaban )  This medication education was reviewed with me or my healthcare representative as part of my discharge preparation.  The pharmacist that spoke with me during my hospital stay was:  Nyelle Wolfson, Harlene BROCKS, [facility]  Why was Eliquis  prescribed for you? Eliquis  was prescribed to treat blood clots that may have been found in the veins of your legs (deep vein thrombosis) or in your lungs (pulmonary embolism) and to reduce the risk of them occurring again.  What do You need to know about Eliquis  ? The starting dose is 10 mg (two 5 mg tablets) taken TWICE daily for the FIRST SEVEN (7) DAYS, then on (enter date)  01/23/24  the dose is reduced to ONE 5 mg tablet taken TWICE daily.  Eliquis  may be taken with or without food.   Try to take the dose about the same time in the morning and in the evening. If you have difficulty swallowing the tablet whole please discuss with your pharmacist how to take the medication safely.  Take Eliquis  exactly as prescribed and DO NOT stop taking Eliquis  without talking to the doctor who prescribed the medication.  Stopping may increase your risk of developing a new blood clot.  Refill your prescription before you run out.  After discharge, you should have regular check-up appointments with your healthcare provider that is prescribing your Eliquis .    What do you do if you miss a dose? If a dose of ELIQUIS  is not taken at the scheduled time, take it as soon as possible on the same day and twice-daily administration should be resumed. The dose should not be doubled to make up for a missed dose.  Important Safety Information A possible side effect of Eliquis  is bleeding. You should call your healthcare provider right away if you experience any of the following: Bleeding from an injury or your nose that does not stop. Unusual colored urine (red or dark brown) or unusual colored stools (red or black). Unusual bruising for  unknown reasons. A serious fall or if you hit your head (even if there is no bleeding).  Some medicines may interact with Eliquis  and might increase your risk of bleeding or clotting while on Eliquis . To help avoid this, consult your healthcare provider or pharmacist prior to using any new prescription or non-prescription medications, including herbals, vitamins, non-steroidal anti-inflammatory drugs (NSAIDs) and supplements.  This website has more information on Eliquis  (apixaban ): http://[email or web address] .com/eliquis dena

## 2024-01-16 NOTE — Progress Notes (Signed)
 Rounding Note   Patient Name: [name] [name] Date of Encounter: 01/16/2024  [facility] Cardiologist: Lynwood Schilling, MD   Subjective Still with some mild chest pain with inspiration.   Scheduled Meds:  atorvastatin   20 mg Oral Daily   levothyroxine   50 mcg Oral QAC breakfast   metoprolol  tartrate  25 mg Oral QID   oxybutynin   10 mg Oral QHS   pantoprazole   40 mg Oral BID AC   Continuous Infusions:  heparin  1,400 Units/hr (01/16/24 0358)   PRN Meds: acetaminophen  **OR** acetaminophen , fentaNYL  (SUBLIMAZE ) injection, HYDROcodone -acetaminophen , ondansetron  **OR** ondansetron  (ZOFRAN ) IV, mouth rinse, phenol   Vital Signs  Vitals:   01/15/24 1946 01/15/24 2304 01/16/24 0412 01/16/24 0732  BP: (!) 150/88 (!) 144/90 (!) 153/82 (!) 151/90  Pulse: (!) 105 (!) 105 91 97  Resp: 20 (!) 25 20 19   Temp: 99.3 F (37.4 C) 98.8 F (37.1 C) 98.1 F (36.7 C) 97.9 F (36.6 C)  TempSrc: Oral Oral Oral Oral  SpO2: 92% 93% 94% 92%  Weight:   91.9 kg   Height:        Intake/Output Summary (Last 24 hours) at 01/16/2024 0811 Last data filed at 01/16/2024 0415 Gross per 24 hour  Intake 997.73 ml  Output 2950 ml  Net -1952.27 ml      01/16/2024    4:12 AM 01/14/2024    7:15 PM 01/14/2024    4:02 PM  Last 3 Weights  Weight (lbs) 202 lb 9.6 oz 204 lb 14.4 oz 199 lb 15.3 oz  Weight (kg) 91.9 kg 92.942 kg 90.7 kg      Telemetry Atrial fib with RVR - Personally Reviewed  ECG  NA - Personally Reviewed  Physical Exam  GEN: No  acute distress.   Neck: No  JVD Cardiac: Irregular  RR, distant heart sounds, rubs, or gallops.  Respiratory:    Decreased breath sounds at the bases right greater than left.  GI: Soft, nontender, non-distended, normal bowel sounds  MS:  No edema; No deformity. Neuro:   Nonfocal  Psych: Oriented and appropriate    Labs High Sensitivity Troponin:   Recent Labs  Lab 01/14/24 2108 01/14/24 2233  TROPONINIHS 9 10     Chemistry Recent  Labs  Lab 01/12/24 1100 01/14/24 1116 01/14/24 2055 01/15/24 0311 01/16/24 0237  NA 140 141  --  139 139  K 3.9 4.3  --  4.0 3.8  CL 110 106  --  112* 109  CO2 21* 22  --  21* 22  GLUCOSE 114* 110*  --  126* 111*  BUN 40* 34*  --  22 22  CREATININE 1.24* 1.43*  --  1.07* 1.24*  CALCIUM  11.0* 11.8*  --  10.4* 10.5*  MG  --   --  2.1 2.2  --   PROT 6.3*  --   --  5.7*  --   ALBUMIN  3.6  --   --  3.2*  --   AST 13*  --   --  9*  --   ALT 12  --   --  10  --   ALKPHOS 98  --   --  84  --   BILITOT 0.4  --   --  0.7  --   GFRNONAA 43* 36*  --  52* 43*  ANIONGAP 9 14  --  6 8    Lipids No results for input(s): CHOL, TRIG, HDL, LABVLDL, LDLCALC, CHOLHDL in the last 168 hours.  Hematology Recent Labs  Lab 01/14/24 1116 01/15/24 0311 01/16/24 0237  WBC 7.9 7.7 8.2  RBC 3.94 3.64* 3.63*  HGB 12.1 11.0* 11.4*  HCT 37.4 35.1* 34.7*  MCV 94.9 96.4 95.6  MCH 30.7 30.2 31.4  MCHC 32.4 31.3 32.9  RDW 13.9 13.8 13.4  PLT 156 156 169   Thyroid   Recent Labs  Lab 01/14/24 2055  TSH 2.297    BNP Recent Labs  Lab 01/14/24 2108  BNP 458.3*    DDimer  Recent Labs  Lab 01/14/24 1116  DDIMER 6.73*     Radiology  VAS US  LOWER EXTREMITY VENOUS (DVT) Result Date: 01/15/2024  Lower Venous DVT Study Patient Name:  SANTINA TRILLO  Date of Exam:   01/15/2024 Medical Rec #: [id number]            Accession #:    [id number] Date of Birth: 22-Oct-1940            Patient Gender: F Patient Age:   53 years Exam Location:  [facility] Procedure:      VAS US  LOWER EXTREMITY VENOUS (DVT) Referring Phys: BLEASE DOUTOVA --------------------------------------------------------------------------------  Indications: Pulmonary embolism.  Risk Factors: DVT Hx of BLE DVT in 2020. Performing Technologist: Elmarie Crump,RVT  Examination Guidelines: A complete evaluation includes B-mode imaging, spectral Doppler, color Doppler, and power Doppler as needed of all accessible portions  of each vessel. Bilateral testing is considered an integral part of a complete examination. Limited examinations for reoccurring indications may be performed as noted. The reflux portion of the exam is performed with the patient in reverse Trendelenburg.  +---------+---------------+---------+-----------+----------+-------------------+ RIGHT    CompressibilityPhasicitySpontaneityPropertiesThrombus Aging      +---------+---------------+---------+-----------+----------+-------------------+ CFV      Partial        Yes      Yes                                      +---------+---------------+---------+-----------+----------+-------------------+ SFJ      Full                                                             +---------+---------------+---------+-----------+----------+-------------------+ FV Prox  Full                                                             +---------+---------------+---------+-----------+----------+-------------------+ FV Mid   Full                                                             +---------+---------------+---------+-----------+----------+-------------------+ FV DistalFull                                                             +---------+---------------+---------+-----------+----------+-------------------+  PFV      Full                                                             +---------+---------------+---------+-----------+----------+-------------------+ POP      Full           Yes      Yes                                      +---------+---------------+---------+-----------+----------+-------------------+ PTV      Full                                                             +---------+---------------+---------+-----------+----------+-------------------+ PERO                                                  Not well visualized  +---------+---------------+---------+-----------+----------+-------------------+ There is free floating thrombus that appears to be attached to a valve in the CFV. The peroneal veins are not seen well.  +---------+---------------+---------+-----------+----------+-------------------+ LEFT     CompressibilityPhasicitySpontaneityPropertiesThrombus Aging      +---------+---------------+---------+-----------+----------+-------------------+ CFV      Full           Yes      Yes                                      +---------+---------------+---------+-----------+----------+-------------------+ SFJ      Full                                                             +---------+---------------+---------+-----------+----------+-------------------+ FV Prox  Full                                                             +---------+---------------+---------+-----------+----------+-------------------+ FV Mid   Partial                                                          +---------+---------------+---------+-----------+----------+-------------------+ FV Distal                                             Not well visualized +---------+---------------+---------+-----------+----------+-------------------+ PFV  Full                                                             +---------+---------------+---------+-----------+----------+-------------------+ POP      None                                                             +---------+---------------+---------+-----------+----------+-------------------+ PTV      Full                                                             +---------+---------------+---------+-----------+----------+-------------------+ PERO                                                  Not well visualized +---------+---------------+---------+-----------+----------+-------------------+ Acute appearing, partially compressible  thrombus in the mid femoral vein and noncompresssible thrombus in the popliteal vein. The distal femoral and peroneal veins are not seen well.    Summary: RIGHT: - Findings consistent with acute deep vein thrombosis involving the right common femoral vein.  - No cystic structure found in the popliteal fossa.  LEFT: - Findings consistent with acute deep vein thrombosis involving the left femoral vein, and left popliteal vein.  - The positive findings were reported to Dr. Blease Quiver, MD  *See table(s) above for measurements and observations. Electronically signed by Lonni Gaskins MD on 01/15/2024 at 2:45:55 PM.    Final    ECHOCARDIOGRAM COMPLETE Result Date: 01/15/2024    ECHOCARDIOGRAM REPORT   Patient Name:   DAAIYAH BAUMERT Date of Exam: 01/15/2024 Medical Rec #:  [id number]           Height:       63.0 in Accession #:    [id number]          Weight:       204.9 lb Date of Birth:  [birth year]           BSA:          1.954 m Patient Age:    83 years            BP:           118/66 mmHg Patient Gender: F                   HR:           86 bpm. Exam Location:  Inpatient Procedure: 2D Echo, Cardiac Doppler, Color Doppler and Intracardiac            Opacification Agent (Both Spectral and Color Flow Doppler were            utilized during procedure). Indications:    Pulmonary Embolus I26.09  History:        Patient has prior history of Echocardiogram examinations, most  recent 08/18/2023.  Sonographer:    Tinnie Gosling RDCS Referring Phys: [id number] ANASTASSIA DOUTOVA IMPRESSIONS  1. Left ventricular ejection fraction, by estimation, is 40 to 45%. The left ventricle has mildly decreased function. The left ventricle demonstrates global hypokinesis. There is mild left ventricular hypertrophy. Left ventricular diastolic parameters are consistent with Grade I diastolic dysfunction (impaired relaxation).  2. Right ventricular systolic function is mildly reduced. The right ventricular size is normal.  Mildly increased right ventricular wall thickness.  3. Left atrial size was severely dilated.  4. Right atrial size was mildly dilated.  5. The mitral valve is normal in structure. Moderate mitral valve regurgitation. Moderate to severe mitral stenosis.  6. The aortic valve is tricuspid. Aortic valve regurgitation is not visualized. Aortic valve sclerosis is present, with no evidence of aortic valve stenosis. FINDINGS  Left Ventricle: Left ventricular ejection fraction, by estimation, is 40 to 45%. The left ventricle has mildly decreased function. The left ventricle demonstrates global hypokinesis. The left ventricular internal cavity size was normal in size. There is  mild left ventricular hypertrophy. Left ventricular diastolic parameters are consistent with Grade I diastolic dysfunction (impaired relaxation). Right Ventricle: The right ventricular size is normal. Mildly increased right ventricular wall thickness. Right ventricular systolic function is mildly reduced. Left Atrium: Left atrial size was severely dilated. Right Atrium: Right atrial size was mildly dilated. Pericardium: There is no evidence of pericardial effusion. Mitral Valve: Velocity not optimized for PISA method but MR is worse than previous and is likely in the moderate to severe range. The mitral valve is normal in structure. Mildly decreased mobility of the mitral valve leaflets. Moderate mitral valve regurgitation. Moderate to severe mitral valve stenosis. Tricuspid Valve: The tricuspid valve is normal in structure. Tricuspid valve regurgitation is mild. Aortic Valve: The aortic valve is tricuspid. Aortic valve regurgitation is not visualized. Aortic valve sclerosis is present, with no evidence of aortic valve stenosis. Pulmonic Valve: The pulmonic valve was normal in structure. Pulmonic valve regurgitation is mild. Aorta: The aortic root and ascending aorta are structurally normal, with no evidence of dilitation. IAS/Shunts: No atrial level  shunt detected by color flow Doppler.  LEFT VENTRICLE PLAX 2D LVIDd:         5.30 cm LVIDs:         4.40 cm LV PW:         1.00 cm LV IVS:        1.00 cm LVOT diam:     2.20 cm LV SV:         39 LV SV Index:   20 LVOT Area:     3.80 cm  LV Volumes (MOD) LV vol d, MOD A2C: 103.0 ml LV vol d, MOD A4C: 145.0 ml LV vol s, MOD A2C: 62.8 ml LV vol s, MOD A4C: 71.8 ml LV SV MOD A2C:     40.2 ml LV SV MOD A4C:     145.0 ml LV SV MOD BP:      58.4 ml RIGHT VENTRICLE         IVC TAPSE (M-mode): 1.4 cm  IVC diam: 2.50 cm LEFT ATRIUM              Index        RIGHT ATRIUM           Index LA diam:        3.50 cm  1.79 cm/m   RA Area:     22.10 cm LA Vol (A2C):  141.0 ml 72.17 ml/m  RA Volume:   59.80 ml  30.61 ml/m LA Vol (A4C):   150.0 ml 76.78 ml/m LA Biplane Vol: 152.0 ml 77.80 ml/m  AORTIC VALVE LVOT Vmax:   70.30 cm/s LVOT Vmean:  43.300 cm/s LVOT VTI:    0.103 m  AORTA Ao Root diam: 3.40 cm Ao Asc diam:  3.80 cm MITRAL VALVE MV Area (PHT): 6.71 cm     SHUNTS MV Decel Time: 113 msec     Systemic VTI:  0.10 m MV E velocity: 104.00 cm/s  Systemic Diam: 2.20 cm MV A velocity: 41.40 cm/s MV E/A ratio:  2.51 Morene Brownie Electronically signed by Morene Brownie Signature Date/Time: 01/15/2024/11:15:51 AM    Final    CT Angio Chest PE W/Cm &/Or Wo Cm Addendum Date: 01/14/2024 ADDENDUM REPORT: 01/14/2024 15:54 ADDENDUM: Critical Value/emergent results were called by telephone at the time of interpretation on 01/14/2024 at 3:53 pm to provider Dr. Neysa, who verbally acknowledged these results. Electronically Signed   By: Leita Birmingham M.D.   On: 01/14/2024 15:54   Result Date: 01/14/2024 CLINICAL DATA:  Pulmonary embolism suspected, low to intermediate probability. Positive D-dimer. EXAM: CT ANGIOGRAPHY CHEST WITH CONTRAST TECHNIQUE: Multidetector CT imaging of the chest was performed using the standard protocol during bolus administration of intravenous contrast. Multiplanar CT image reconstructions and MIPs were  obtained to evaluate the vascular anatomy. RADIATION DOSE REDUCTION: This exam was performed according to the departmental dose-optimization program which includes automated exposure control, adjustment of the mA and/or kV according to patient size and/or use of iterative reconstruction technique. CONTRAST:  50mL OMNIPAQUE  IOHEXOL  350 MG/ML SOLN COMPARISON:  04/03/2019. FINDINGS: Cardiovascular: The heart is enlarged and there is a trace pericardial effusion. Scattered coronary artery calcifications are present. An occlusion device is present in the left atrial appendage. There is atherosclerotic calcification of the aorta without evidence of aneurysm. The pulmonary trunk is distended suggesting underlying pulmonary artery hypertension. Pulmonary artery filling defects are present in the right pulmonary artery extending into the lobar, segmental, of subsegmental arteries in the right upper, middle, and lower lobes. There are segmental and subsegmental pulmonary emboli in the left upper and lower lobes. There is no evidence of right heart strain. Mediastinum/Nodes: No mediastinal or axillary lymphadenopathy. A nonspecific prominent lymph nodes present at the right hilum. There is a stable hypodense lesion in the left lobe of the thyroid  gland measuring 1.9 cm. The trachea and esophagus are within normal limits. Lungs/Pleura: There is a small pleural effusion on the right a trace pleural effusion on the left. No pneumothorax is seen. Atelectasis is present bilaterally. Upper Abdomen: No acute abnormality. Cystic structures are present in the left upper quadrant, which may be renal in origin and unchanged from the prior exam. Musculoskeletal: Degenerative changes are present in the thoracic spine. No acute osseous abnormality is seen. Review of the MIP images confirms the above findings. IMPRESSION: 1. Bilateral pulmonary emboli, moderate volume. No evidence of right heart strain. 2. Small left pleural effusion and  trace right pleural effusion with atelectasis. 3. Distended pulmonary trunk suggesting underlying pulmonary artery hypertension. 4. Cardiomegaly with coronary artery calcifications. 5. Aortic atherosclerosis. Electronically Signed: By: Leita Birmingham M.D. On: 01/14/2024 15:49   DG Chest Port 1 View Result Date: 01/14/2024 CLINICAL DATA:  RIGHT anterior chest pain. EXAM: PORTABLE CHEST 1 VIEW COMPARISON:  None Available. FINDINGS: The heart size and mediastinal contours are within normal limits. Both lungs are clear. The visualized skeletal structures are  unremarkable. Atrial appendage closure device noted IMPRESSION: Insert no acute Electronically Signed   By: Jackquline Boxer M.D.   On: 01/14/2024 14:14    Cardiac Studies See echo above.   Patient Profile   83 y.o. female with a hx of HFimpEF, mitral valve disease, hypertension, DVT/PE 7/21, history of GI bleed, iron deficiency anemia, CKD stage III and history of persistent atrial fibrillation s/p Watchman device 06/15/2023 who is being seen 01/15/2024 for the evaluation of afib with RVR at the request of Dr. Fairy.   Assessment & Plan  Persistent atrial fibrillation with RVR :  Increased beta blocker yesterday.   Will change to Toprol  XL today.  Now back on anticoagulation.  Transition to DOAC per primary team.  Acute PE: This is her second DVT/PE since 03/2019.  Lifetime anticoagulation.     HFimpEF: EF 40 - 45%.    I will stop her Norvasc  (held thus far) and restart her Cozaar .  Increase beta blocker as above.   Net negative 1.9 liters this admission.   Resume Farxiga .  No indication for resumed Lasix  at this time.     History of GI bleed/iron deficiency anemia:  No acute bleeding.  Hgb stable this admission.    Hypertension:  This is being managed in the context of treating his CHF.      CKD stage III: Renal function stable creat 1.24 today.  Follow creat closely.      For questions or updates, please contact [facility] Please consult [email or web address] for contact info under     Signed, Lynwood Schilling, MD  01/16/2024, 8:11 AM

## 2024-01-16 NOTE — Progress Notes (Addendum)
 PROGRESS NOTE    [name] [name]  FMW:[id number] DOB: 1940-11-23 DOA: 01/14/2024 PCP: Joesph Annabella HERO, FNP  83/F with history of chronic systolic CHF with recovered EF, A-fib with watchman's device, history of iron deficiency anemia, GAVEs' disease, history of PE, CKD 3 AA, hypertension Was taken off Eliquis  in January following her Watchman procedure in November 2020 for.  Presented to the ED with swelling in both legs worse on the left, also developed right-sided chest pain yesterday.  In the ED noted to be in A-fib RVR, hypoxic placed on 4 L O2, CTA chest noted bilateral pulmonary emboli of moderate volume, small bilateral pleural effusions - Admitted, started on IV heparin , metoprolol  dose increased, Lasix   Subjective: Feels better overall, breathing continues to improve, right-sided chest pain improving  Assessment and Plan:  Acute bilateral PE Bilateral DVT Acute hypoxic respiratory failure -After coming off anticoagulation in January 2025 - Completes 48 hours of IV heparin , transition to Eliquis  today -Ideally needs long-term anticoagulation now - 2D echo with EF 40-45%, grade 1 DD, mildly reduced RV, moderate MR  A-fib RVR - History of Watchman device - Continue metoprolol  today, heart rate improved - Back on anticoagulation for PE/DVT  Acute on chronic combined CHF - Previously with recovered EF - Last echo 1/25 with EF 50-55%, repeat echo now with EF down to 40-45%, grade 1 DD, mildly reduced RV, moderate MR - Repeat Lasix  again today, continue Farxiga   Mild AKI - Likely hemodynamically mediated, improving  Chronic iron deficiency anemia from GI blood loss GAVE's disease - On periodic iron infusions, hemoglobin relatively stable at this time  Type 2 diabetes mellitus - Farxiga , SSI  Obesity  DVT prophylaxis:IV heparin  Code Status: DNR Family Communication: Grand daughter at bedside Disposition Plan: Home in 1 to 2 days  Consultants:  Pulm   Procedures:   Antimicrobials:    Objective: Vitals:   01/16/24 0732 01/16/24 0939 01/16/24 0945 01/16/24 1021  BP: (!) 151/90 125/71  121/73  Pulse: 97 (!) 118  (!) 103  Resp: 19   20  Temp: 97.9 F (36.6 C)   98.5 F (36.9 C)  TempSrc: Oral   Oral  SpO2: 92%  95% 93%  Weight:      Height:        Intake/Output Summary (Last 24 hours) at 01/16/2024 1205 Last data filed at 01/16/2024 1140 Gross per 24 hour  Intake 637.73 ml  Output 2650 ml  Net -2012.27 ml   Filed Weights   01/14/24 1602 01/14/24 1915 01/16/24 0412  Weight: 90.7 kg 92.9 kg 91.9 kg    Examination:  General exam: Pleasant female sitting up in the recliner, AAO x 3, no distress HEENT: Neck obese unable to assess JVD CVS: S1-S2, irregular rhythm Lungs: Rare basilar rales Abdomen: Soft, nontender, bowel sounds present Remedies: Trace edema bilaterally  Skin: No rashes Psychiatry:  Mood & affect appropriate.     Data Reviewed:   CBC: Recent Labs  Lab 01/12/24 1100 01/14/24 1116 01/15/24 0311 01/16/24 0237  WBC 6.7 7.9 7.7 8.2  NEUTROABS 4.7  --   --   --   HGB 12.1 12.1 11.0* 11.4*  HCT 37.2 37.4 35.1* 34.7*  MCV 95.6 94.9 96.4 95.6  PLT 172 156 156 169   Basic Metabolic Panel: Recent Labs  Lab 01/12/24 1100 01/14/24 1116 01/14/24 2055 01/15/24 0311 01/16/24 0237  NA 140 141  --  139 139  K 3.9 4.3  --  4.0 3.8  CL 110  106  --  112* 109  CO2 21* 22  --  21* 22  GLUCOSE 114* 110*  --  126* 111*  BUN 40* 34*  --  22 22  CREATININE 1.24* 1.43*  --  1.07* 1.24*  CALCIUM  11.0* 11.8*  --  10.4* 10.5*  MG  --   --  2.1 2.2  --   PHOS  --   --  2.7 3.1  --    GFR: Estimated Creatinine Clearance: 37.7 mL/min (A) (by C-G formula based on SCr of 1.24 mg/dL (H)). Liver Function Tests: Recent Labs  Lab 01/12/24 1100 01/15/24 0311  AST 13* 9*  ALT 12 10  ALKPHOS 98 84  BILITOT 0.4 0.7  PROT 6.3* 5.7*  ALBUMIN  3.6 3.2*   No results for input(s): LIPASE, AMYLASE in the  last 168 hours. No results for input(s): AMMONIA in the last 168 hours. Coagulation Profile: No results for input(s): INR, PROTIME in the last 168 hours. Cardiac Enzymes: No results for input(s): CKTOTAL, CKMB, CKMBINDEX, TROPONINI in the last 168 hours. BNP (last 3 results) No results for input(s): PROBNP in the last 8760 hours. HbA1C: No results for input(s): HGBA1C in the last 72 hours. CBG: No results for input(s): GLUCAP in the last 168 hours. Lipid Profile: No results for input(s): CHOL, HDL, LDLCALC, TRIG, CHOLHDL, LDLDIRECT in the last 72 hours. Thyroid  Function Tests: Recent Labs    01/14/24 2055  TSH 2.297   Anemia Panel: No results for input(s): VITAMINB12, FOLATE, FERRITIN, TIBC, IRON, RETICCTPCT in the last 72 hours.  Urine analysis:    Component Value Date/Time   COLORURINE YELLOW 01/14/2024 1018   APPEARANCEUR CLEAR 01/14/2024 1018   APPEARANCEUR Clear 11/24/2023 1336   LABSPEC 1.016 01/14/2024 1018   PHURINE 5.5 01/14/2024 1018   GLUCOSEU >1,000 (A) 01/14/2024 1018   HGBUR NEGATIVE 01/14/2024 1018   BILIRUBINUR NEGATIVE 01/14/2024 1018   BILIRUBINUR Negative 11/24/2023 1336   KETONESUR NEGATIVE 01/14/2024 1018   PROTEINUR TRACE (A) 01/14/2024 1018   UROBILINOGEN 0.2 10/22/2013 1127   NITRITE NEGATIVE 01/14/2024 1018   LEUKOCYTESUR MODERATE (A) 01/14/2024 1018   Sepsis Labs: @LABRCNTIP (procalcitonin:4,lacticidven:4)  ) Recent Results (from the past 240 hours)  Urine Culture     Status: Abnormal   Collection Time: 01/14/24 10:18 AM   Specimen: Urine, Clean Catch  Result Value Ref Range Status   Specimen Description   Final    URINE, CLEAN CATCH Performed at [facility], [address], [place], [place] [address]    Special Requests   Final    NONE Performed at [facility], [address] [place], [place] [address]    Culture (A)  Final    <10,000 COLONIES/mL  INSIGNIFICANT GROWTH Performed at [facility], [address], [place], [place] [address]    Report Status 01/15/2024 FINAL  Final     Radiology Studies: VAS US  LOWER EXTREMITY VENOUS (DVT) Result Date: 01/15/2024  Lower Venous DVT Study Patient Name:  [name] [name]  Date of Exam:   01/15/2024 Medical Rec #: [id number]            Accession #:    [id number] Date of Birth: 11-19-1940            Patient Gender: F Patient Age:   8 years Exam Location:  [facility] Procedure:      VAS US  LOWER EXTREMITY VENOUS (DVT) Referring Phys: ANASTASSIA DOUTOVA --------------------------------------------------------------------------------  Indications: Pulmonary embolism.  Risk Factors:  DVT Hx of BLE DVT in 2020. Performing Technologist: Elmarie Crump,RVT  Examination Guidelines: A complete evaluation includes B-mode imaging, spectral Doppler, color Doppler, and power Doppler as needed of all accessible portions of each vessel. Bilateral testing is considered an integral part of a complete examination. Limited examinations for reoccurring indications may be performed as noted. The reflux portion of the exam is performed with the patient in reverse Trendelenburg.  +---------+---------------+---------+-----------+----------+-------------------+ RIGHT    CompressibilityPhasicitySpontaneityPropertiesThrombus Aging      +---------+---------------+---------+-----------+----------+-------------------+ CFV      Partial        Yes      Yes                                      +---------+---------------+---------+-----------+----------+-------------------+ SFJ      Full                                                             +---------+---------------+---------+-----------+----------+-------------------+ FV Prox  Full                                                             +---------+---------------+---------+-----------+----------+-------------------+ FV Mid   Full                                                              +---------+---------------+---------+-----------+----------+-------------------+ FV DistalFull                                                             +---------+---------------+---------+-----------+----------+-------------------+ PFV      Full                                                             +---------+---------------+---------+-----------+----------+-------------------+ POP      Full           Yes      Yes                                      +---------+---------------+---------+-----------+----------+-------------------+ PTV      Full                                                             +---------+---------------+---------+-----------+----------+-------------------+  PERO                                                  Not well visualized +---------+---------------+---------+-----------+----------+-------------------+ There is free floating thrombus that appears to be attached to a valve in the CFV. The peroneal veins are not seen well.  +---------+---------------+---------+-----------+----------+-------------------+ LEFT     CompressibilityPhasicitySpontaneityPropertiesThrombus Aging      +---------+---------------+---------+-----------+----------+-------------------+ CFV      Full           Yes      Yes                                      +---------+---------------+---------+-----------+----------+-------------------+ SFJ      Full                                                             +---------+---------------+---------+-----------+----------+-------------------+ FV Prox  Full                                                             +---------+---------------+---------+-----------+----------+-------------------+ FV Mid   Partial                                                           +---------+---------------+---------+-----------+----------+-------------------+ FV Distal                                             Not well visualized +---------+---------------+---------+-----------+----------+-------------------+ PFV      Full                                                             +---------+---------------+---------+-----------+----------+-------------------+ POP      None                                                             +---------+---------------+---------+-----------+----------+-------------------+ PTV      Full                                                             +---------+---------------+---------+-----------+----------+-------------------+  PERO                                                  Not well visualized +---------+---------------+---------+-----------+----------+-------------------+ Acute appearing, partially compressible thrombus in the mid femoral vein and noncompresssible thrombus in the popliteal vein. The distal femoral and peroneal veins are not seen well.    Summary: RIGHT: - Findings consistent with acute deep vein thrombosis involving the right common femoral vein.  - No cystic structure found in the popliteal fossa.  LEFT: - Findings consistent with acute deep vein thrombosis involving the left femoral vein, and left popliteal vein.  - The positive findings were reported to Dr. Blease Quiver, MD  *See table(s) above for measurements and observations. Electronically signed by Lonni Gaskins MD on 01/15/2024 at 2:45:55 PM.    Final    ECHOCARDIOGRAM COMPLETE Result Date: 01/15/2024    ECHOCARDIOGRAM REPORT   Patient Name:   [name] [name] Date of Exam: 01/15/2024 Medical Rec #:  [id number]           Height:       63.0 in Accession #:    [id number]          Weight:       204.9 lb Date of Birth:  [birth year]           BSA:          1.954 m Patient Age:    83 years            BP:           118/66 mmHg  Patient Gender: F                   HR:           86 bpm. Exam Location:  Inpatient Procedure: 2D Echo, Cardiac Doppler, Color Doppler and Intracardiac            Opacification Agent (Both Spectral and Color Flow Doppler were            utilized during procedure). Indications:    Pulmonary Embolus I26.09  History:        Patient has prior history of Echocardiogram examinations, most                 recent 08/18/2023.  Sonographer:    Tinnie Gosling RDCS Referring Phys: [id number] ANASTASSIA DOUTOVA IMPRESSIONS  1. Left ventricular ejection fraction, by estimation, is 40 to 45%. The left ventricle has mildly decreased function. The left ventricle demonstrates global hypokinesis. There is mild left ventricular hypertrophy. Left ventricular diastolic parameters are consistent with Grade I diastolic dysfunction (impaired relaxation).  2. Right ventricular systolic function is mildly reduced. The right ventricular size is normal. Mildly increased right ventricular wall thickness.  3. Left atrial size was severely dilated.  4. Right atrial size was mildly dilated.  5. The mitral valve is normal in structure. Moderate mitral valve regurgitation. Moderate to severe mitral stenosis.  6. The aortic valve is tricuspid. Aortic valve regurgitation is not visualized. Aortic valve sclerosis is present, with no evidence of aortic valve stenosis. FINDINGS  Left Ventricle: Left ventricular ejection fraction, by estimation, is 40 to 45%. The left ventricle has mildly decreased function. The left ventricle demonstrates global hypokinesis. The left ventricular internal cavity size was normal in size. There is  mild left  ventricular hypertrophy. Left ventricular diastolic parameters are consistent with Grade I diastolic dysfunction (impaired relaxation). Right Ventricle: The right ventricular size is normal. Mildly increased right ventricular wall thickness. Right ventricular systolic function is mildly reduced. Left Atrium: Left atrial size was  severely dilated. Right Atrium: Right atrial size was mildly dilated. Pericardium: There is no evidence of pericardial effusion. Mitral Valve: Velocity not optimized for PISA method but MR is worse than previous and is likely in the moderate to severe range. The mitral valve is normal in structure. Mildly decreased mobility of the mitral valve leaflets. Moderate mitral valve regurgitation. Moderate to severe mitral valve stenosis. Tricuspid Valve: The tricuspid valve is normal in structure. Tricuspid valve regurgitation is mild. Aortic Valve: The aortic valve is tricuspid. Aortic valve regurgitation is not visualized. Aortic valve sclerosis is present, with no evidence of aortic valve stenosis. Pulmonic Valve: The pulmonic valve was normal in structure. Pulmonic valve regurgitation is mild. Aorta: The aortic root and ascending aorta are structurally normal, with no evidence of dilitation. IAS/Shunts: No atrial level shunt detected by color flow Doppler.  LEFT VENTRICLE PLAX 2D LVIDd:         5.30 cm LVIDs:         4.40 cm LV PW:         1.00 cm LV IVS:        1.00 cm LVOT diam:     2.20 cm LV SV:         39 LV SV Index:   20 LVOT Area:     3.80 cm  LV Volumes (MOD) LV vol d, MOD A2C: 103.0 ml LV vol d, MOD A4C: 145.0 ml LV vol s, MOD A2C: 62.8 ml LV vol s, MOD A4C: 71.8 ml LV SV MOD A2C:     40.2 ml LV SV MOD A4C:     145.0 ml LV SV MOD BP:      58.4 ml RIGHT VENTRICLE         IVC TAPSE (M-mode): 1.4 cm  IVC diam: 2.50 cm LEFT ATRIUM              Index        RIGHT ATRIUM           Index LA diam:        3.50 cm  1.79 cm/m   RA Area:     22.10 cm LA Vol (A2C):   141.0 ml 72.17 ml/m  RA Volume:   59.80 ml  30.61 ml/m LA Vol (A4C):   150.0 ml 76.78 ml/m LA Biplane Vol: 152.0 ml 77.80 ml/m  AORTIC VALVE LVOT Vmax:   70.30 cm/s LVOT Vmean:  43.300 cm/s LVOT VTI:    0.103 m  AORTA Ao Root diam: 3.40 cm Ao Asc diam:  3.80 cm MITRAL VALVE MV Area (PHT): 6.71 cm     SHUNTS MV Decel Time: 113 msec     Systemic VTI:   0.10 m MV E velocity: 104.00 cm/s  Systemic Diam: 2.20 cm MV A velocity: 41.40 cm/s MV E/A ratio:  2.51 Morene Brownie Electronically signed by Morene Brownie Signature Date/Time: 01/15/2024/11:15:51 AM    Final    CT Angio Chest PE W/Cm &/Or Wo Cm Addendum Date: 01/14/2024 ADDENDUM REPORT: 01/14/2024 15:54 ADDENDUM: Critical Value/emergent results were called by telephone at the time of interpretation on 01/14/2024 at 3:53 pm to provider Dr. Neysa, who verbally acknowledged these results. Electronically Signed   By: Leita Waddell HERO.D.  On: 01/14/2024 15:54   Result Date: 01/14/2024 CLINICAL DATA:  Pulmonary embolism suspected, low to intermediate probability. Positive D-dimer. EXAM: CT ANGIOGRAPHY CHEST WITH CONTRAST TECHNIQUE: Multidetector CT imaging of the chest was performed using the standard protocol during bolus administration of intravenous contrast. Multiplanar CT image reconstructions and MIPs were obtained to evaluate the vascular anatomy. RADIATION DOSE REDUCTION: This exam was performed according to the departmental dose-optimization program which includes automated exposure control, adjustment of the mA and/or kV according to patient size and/or use of iterative reconstruction technique. CONTRAST:  50mL OMNIPAQUE  IOHEXOL  350 MG/ML SOLN COMPARISON:  04/03/2019. FINDINGS: Cardiovascular: The heart is enlarged and there is a trace pericardial effusion. Scattered coronary artery calcifications are present. An occlusion device is present in the left atrial appendage. There is atherosclerotic calcification of the aorta without evidence of aneurysm. The pulmonary trunk is distended suggesting underlying pulmonary artery hypertension. Pulmonary artery filling defects are present in the right pulmonary artery extending into the lobar, segmental, of subsegmental arteries in the right upper, middle, and lower lobes. There are segmental and subsegmental pulmonary emboli in the left upper and lower lobes.  There is no evidence of right heart strain. Mediastinum/Nodes: No mediastinal or axillary lymphadenopathy. A nonspecific prominent lymph nodes present at the right hilum. There is a stable hypodense lesion in the left lobe of the thyroid  gland measuring 1.9 cm. The trachea and esophagus are within normal limits. Lungs/Pleura: There is a small pleural effusion on the right a trace pleural effusion on the left. No pneumothorax is seen. Atelectasis is present bilaterally. Upper Abdomen: No acute abnormality. Cystic structures are present in the left upper quadrant, which may be renal in origin and unchanged from the prior exam. Musculoskeletal: Degenerative changes are present in the thoracic spine. No acute osseous abnormality is seen. Review of the MIP images confirms the above findings. IMPRESSION: 1. Bilateral pulmonary emboli, moderate volume. No evidence of right heart strain. 2. Small left pleural effusion and trace right pleural effusion with atelectasis. 3. Distended pulmonary trunk suggesting underlying pulmonary artery hypertension. 4. Cardiomegaly with coronary artery calcifications. 5. Aortic atherosclerosis. Electronically Signed: By: Leita Birmingham M.D. On: 01/14/2024 15:49   DG Chest Port 1 View Result Date: 01/14/2024 CLINICAL DATA:  RIGHT anterior chest pain. EXAM: PORTABLE CHEST 1 VIEW COMPARISON:  None Available. FINDINGS: The heart size and mediastinal contours are within normal limits. Both lungs are clear. The visualized skeletal structures are unremarkable. Atrial appendage closure device noted IMPRESSION: Insert no acute Electronically Signed   By: Jackquline Boxer M.D.   On: 01/14/2024 14:14     Scheduled Meds:  atorvastatin   20 mg Oral Daily   dapagliflozin  propanediol  10 mg Oral Daily   levothyroxine   50 mcg Oral QAC breakfast   metoprolol  succinate  100 mg Oral Daily   oxybutynin   10 mg Oral QHS   pantoprazole   40 mg Oral BID AC   Continuous Infusions:  heparin  1,400  Units/hr (01/16/24 0358)     LOS: 1 day    Time spent:    Sigurd Pac, MD [facility]   01/16/2024, 12:05 PM

## 2024-01-16 NOTE — Progress Notes (Signed)
 ANTICOAGULATION CONSULT NOTE  Pharmacy Consult for Heparin  Indication: pulmonary embolus  No Known Allergies  Patient Measurements: Height: 5' 3 (160 cm) Weight: 91.9 kg (202 lb 9.6 oz) IBW/kg (Calculated) : 52.4 Heparin  Dosing Weight: 73.1 kg  Vital Signs: Temp: 98.5 F (36.9 C) (06/24 1021) Temp Source: Oral (06/24 1021) BP: 121/73 (06/24 1021) Pulse Rate: 103 (06/24 1021)  Labs: Recent Labs    01/14/24 1116 01/14/24 2108 01/14/24 2233 01/15/24 0018 01/15/24 0311 01/15/24 1038 01/16/24 0237  HGB 12.1  --   --   --  11.0*  --  11.4*  HCT 37.4  --   --   --  35.1*  --  34.7*  PLT 156  --   --   --  156  --  169  HEPARINUNFRC  --   --   --  0.43  --  0.43 0.36  CREATININE 1.43*  --   --   --  1.07*  --  1.24*  TROPONINIHS  --  9 10  --   --   --   --     Estimated Creatinine Clearance: 37.7 mL/min (A) (by C-G formula based on SCr of 1.24 mg/dL (H)).  Assessment: 52 yof presenting with chest discomfort. History of PE no longer on eliquis  2/2 GIB. Heparin  per pharmacy consult placed for pulmonary embolus. CTA PE w/ bilateral PE no evidence of RHS. Patient is not on anticoagulation prior to arrival.  Heparin  level this morning remains within goal. Per RN, no signs/symptoms of bleeding. Last CBC stable  Pharmacy asked to transition to po Eliquis .  Goal of Therapy:  Monitor platelets by anticoagulation protocol: Yes   Plan:  Stop IV heparin , and start Eliquis  10 mg po BID x 7 days followed by 5 mg BID for VTE dosing.  Harlene Barlow, [employer], BCCP Clinical Pharmacist  01/16/2024 1:19 PM   [facility] pharmacy phone numbers are listed on [email or web address]

## 2024-01-16 NOTE — Plan of Care (Signed)

## 2024-01-16 NOTE — TOC CM/SW Note (Signed)
 Transition of Care [facility]) - Inpatient Brief Assessment   Patient Details  Name: [name] [name] MRN: [id number] Date of Birth: [birth year]  Transition of Care [facility]) CM/SW Contact:    Waddell Barnie Rama, RN Phone Number: 01/16/2024, 4:07 PM   Clinical Narrative: From home with son, has PCP and insurance on file, states has no HH services in place at this time , has walker and cane at home.  States family member will transport them home at [employer] and family is support system, states gets medications from [facility] in [place].  Pta self ambulatory.    Transition of Care Asessment: Insurance and Status: Insurance coverage has been reviewed Patient has primary care physician: Yes Home environment has been reviewed: home with son Prior level of function:: ambulatory with walker Prior/Current Home Services: Current home services (walker/cane) Social Drivers of Health Review: SDOH reviewed no interventions necessary Readmission risk has been reviewed: Yes Transition of care needs: no transition of care needs at this time

## 2024-01-17 DIAGNOSIS — I4891 Unspecified atrial fibrillation: Secondary | ICD-10-CM | POA: Diagnosis not present

## 2024-01-17 LAB — CBC
HCT: 36.8 % (ref 36.0–46.0)
Hemoglobin: 11.7 g/dL — ABNORMAL LOW (ref 12.0–15.0)
MCH: 30.2 pg (ref 26.0–34.0)
MCHC: 31.8 g/dL (ref 30.0–36.0)
MCV: 95.1 fL (ref 80.0–100.0)
Platelets: 203 10*3/uL (ref 150–400)
RBC: 3.87 MIL/uL (ref 3.87–5.11)
RDW: 13.3 % (ref 11.5–15.5)
WBC: 6.5 10*3/uL (ref 4.0–10.5)
nRBC: 0 % (ref 0.0–0.2)

## 2024-01-17 LAB — BASIC METABOLIC PANEL WITH GFR
Anion gap: 10 (ref 5–15)
BUN: 30 mg/dL — ABNORMAL HIGH (ref 8–23)
CO2: 22 mmol/L (ref 22–32)
Calcium: 11.3 mg/dL — ABNORMAL HIGH (ref 8.9–10.3)
Chloride: 107 mmol/L (ref 98–111)
Creatinine, Ser: 1.36 mg/dL — ABNORMAL HIGH (ref 0.44–1.00)
GFR, Estimated: 39 mL/min — ABNORMAL LOW (ref >60)
Glucose, Bld: 110 mg/dL — ABNORMAL HIGH (ref 70–99)
Potassium: 4.1 mmol/L (ref 3.5–5.1)
Sodium: 139 mmol/L (ref 135–145)

## 2024-01-17 MED ORDER — FUROSEMIDE 40 MG PO TABS
40.0000 mg | ORAL_TABLET | Freq: Every day | ORAL | Status: DC
  Administered 2024-01-17 – 2024-01-18 (×2): 40 mg via ORAL
  Filled 2024-01-17 (×2): qty 1

## 2024-01-17 MED ORDER — METOPROLOL SUCCINATE ER 25 MG PO TB24
125.0000 mg | ORAL_TABLET | Freq: Every day | ORAL | Status: DC
  Administered 2024-01-17 – 2024-01-18 (×2): 125 mg via ORAL
  Filled 2024-01-17 (×2): qty 1

## 2024-01-17 NOTE — Progress Notes (Signed)
 SATURATION QUALIFICATIONS: (This note is used to comply with regulatory documentation for home oxygen )  Patient Saturations on Room Air at Rest = 96%  Patient Saturations on Room Air while Ambulating = 82%  Patient Saturations on 2 Liters of oxygen  while Ambulating = 93%  Please briefly explain why patient needs home oxygen : Desaturation below 88% while ambulating on room air.

## 2024-01-17 NOTE — Progress Notes (Signed)
 1130: Notified by Tele and charge RN that pt's heart rate increased and sustaining in 140's/150's while ambulating, reaching as high as 170's. Patient reported at this time she felt her heart beating faster, and had some shortness of breath, but no other symptoms reported; Denies chest pain.  Cardiologist aware and at bedside. No new orders at this time.

## 2024-01-17 NOTE — Plan of Care (Signed)

## 2024-01-17 NOTE — Progress Notes (Signed)
 Mobility Specialist Progress Note:   01/17/24 1516  Mobility  Activity Ambulated with assistance in hallway  Level of Assistance Contact guard assist, steadying assist  Assistive Device Front wheel walker  Distance Ambulated (ft) 150 ft  Activity Response Tolerated well  Mobility Referral Yes  Mobility visit 1 Mobility  Mobility Specialist Start Time (ACUTE ONLY) 1516  Mobility Specialist Stop Time (ACUTE ONLY) 1536  Mobility Specialist Time Calculation (min) (ACUTE ONLY) 20 min   Pt agreeable to session. No c/o any symptoms. Increase in HR during walk but cleared by RN but pt felt fine nor noticed it. Pt left in recliner vitals stable and all needs met.  Pre Mobility: Recliner HR 88, SpO2 94 RA During Mobility: HR 156-160, SpO2 94 [place] Mobility: Recliner HR 90, SpO2 94 RA  Therisa Rana Mobility Specialist Please contact via Special educational needs teacher or  Rehab office at [phone]

## 2024-01-17 NOTE — Progress Notes (Signed)
 PROGRESS NOTE    [name] [name]  FMW:[id number] DOB: 22-Aug-1940 DOA: 01/14/2024 PCP: Joesph Annabella HERO, FNP  83/F with history of chronic systolic CHF with recovered EF, A-fib with watchman's device, history of iron deficiency anemia, GAVEs' disease, history of PE, CKD 3 AA, hypertension Was taken off Eliquis  in January following her Watchman procedure in November 2020 for.  Presented to the ED with swelling in both legs worse on the left, also developed right-sided chest pain yesterday.  In the ED noted to be in A-fib RVR, hypoxic placed on 4 L O2, CTA chest noted bilateral pulmonary emboli of moderate volume, small bilateral pleural effusions - Admitted, started on IV heparin , metoprolol  dose increased, Lasix   Subjective: Feels better overall, breathing continues to improve, down to 1 L O2 this morning  Assessment and Plan:  Acute bilateral PE Bilateral DVT Acute hypoxic respiratory failure -After coming off anticoagulation in January 2025 - Completed 48 hours of IV heparin , switched to oral Eliquis  yesterday -Ideally needs long-term anticoagulation now - 2D echo with EF 40-45%, grade 1 DD, mildly reduced RV, moderate MR  A-fib RVR - History of Watchman device - Heart rate remains elevated, cards following, Toprol  dose increased to 125 Mg daily - Back on anticoagulation for PE/DVT  Acute on chronic combined CHF - Previously with recovered EF - Last echo 1/25 with EF 50-55%, repeat echo now with EF down to 40-45%, grade 1 DD, mildly reduced RV, moderate MR - Proved with brief diuresis as well, switch to oral Lasix  today, continue Farxiga   Mild AKI - Likely hemodynamically mediated, improving  Chronic iron deficiency anemia from GI blood loss GAVE's disease - On periodic iron infusions, hemoglobin relatively stable at this time  Type 2 diabetes mellitus - Farxiga , SSI  Obesity  DVT prophylaxis: Eliquis  Code Status: DNR Family Communication: Grand daughter at  bedside Disposition Plan: Home in 1 to 2 days  Consultants: Pulm   Procedures:   Antimicrobials:    Objective: Vitals:   01/17/24 0823 01/17/24 0953 01/17/24 1120 01/17/24 1125  BP:  134/80    Pulse:  (!) 101  (!) 51  Resp:   (!) 25 (!) 24  Temp:      TempSrc:      SpO2: 96%   98%  Weight:      Height:        Intake/Output Summary (Last 24 hours) at 01/17/2024 1136 Last data filed at 01/17/2024 9074 Gross per 24 hour  Intake 600 ml  Output 2150 ml  Net -1550 ml   Filed Weights   01/14/24 1915 01/16/24 0412 01/17/24 0047  Weight: 92.9 kg 91.9 kg 91.7 kg    Examination:  General exam: Obese pleasant female sitting up in bed, AAO x 3 HEENT: Neck obese unable to assess JVD CVS: S1-S2, irregular rhythm Lungs: Few basilar rales Abdomen: Soft, nontender, bowel sounds present Extremities: Trace edema Skin: No rashes Psychiatry:  Mood & affect appropriate.     Data Reviewed:   CBC: Recent Labs  Lab 01/12/24 1100 01/14/24 1116 01/15/24 0311 01/16/24 0237 01/17/24 0240  WBC 6.7 7.9 7.7 8.2 6.5  NEUTROABS 4.7  --   --   --   --   HGB 12.1 12.1 11.0* 11.4* 11.7*  HCT 37.2 37.4 35.1* 34.7* 36.8  MCV 95.6 94.9 96.4 95.6 95.1  PLT 172 156 156 169 203   Basic Metabolic Panel: Recent Labs  Lab 01/12/24 1100 01/14/24 1116 01/14/24 2055 01/15/24 0311 01/16/24 0237 01/17/24 0240  NA 140 141  --  139 139 139  K 3.9 4.3  --  4.0 3.8 4.1  CL 110 106  --  112* 109 107  CO2 21* 22  --  21* 22 22  GLUCOSE 114* 110*  --  126* 111* 110*  BUN 40* 34*  --  22 22 30*  CREATININE 1.24* 1.43*  --  1.07* 1.24* 1.36*  CALCIUM  11.0* 11.8*  --  10.4* 10.5* 11.3*  MG  --   --  2.1 2.2  --   --   PHOS  --   --  2.7 3.1  --   --    GFR: Estimated Creatinine Clearance: 34.3 mL/min (A) (by C-G formula based on SCr of 1.36 mg/dL (H)). Liver Function Tests: Recent Labs  Lab 01/12/24 1100 01/15/24 0311  AST 13* 9*  ALT 12 10  ALKPHOS 98 84  BILITOT 0.4 0.7  PROT 6.3*  5.7*  ALBUMIN  3.6 3.2*   No results for input(s): LIPASE, AMYLASE in the last 168 hours. No results for input(s): AMMONIA in the last 168 hours. Coagulation Profile: No results for input(s): INR, PROTIME in the last 168 hours. Cardiac Enzymes: No results for input(s): CKTOTAL, CKMB, CKMBINDEX, TROPONINI in the last 168 hours. BNP (last 3 results) No results for input(s): PROBNP in the last 8760 hours. HbA1C: No results for input(s): HGBA1C in the last 72 hours. CBG: No results for input(s): GLUCAP in the last 168 hours. Lipid Profile: No results for input(s): CHOL, HDL, LDLCALC, TRIG, CHOLHDL, LDLDIRECT in the last 72 hours. Thyroid  Function Tests: Recent Labs    01/14/24 2055  TSH 2.297   Anemia Panel: No results for input(s): VITAMINB12, FOLATE, FERRITIN, TIBC, IRON, RETICCTPCT in the last 72 hours.  Urine analysis:    Component Value Date/Time   COLORURINE YELLOW 01/14/2024 1018   APPEARANCEUR CLEAR 01/14/2024 1018   APPEARANCEUR Clear 11/24/2023 1336   LABSPEC 1.016 01/14/2024 1018   PHURINE 5.5 01/14/2024 1018   GLUCOSEU >1,000 (A) 01/14/2024 1018   HGBUR NEGATIVE 01/14/2024 1018   BILIRUBINUR NEGATIVE 01/14/2024 1018   BILIRUBINUR Negative 11/24/2023 1336   KETONESUR NEGATIVE 01/14/2024 1018   PROTEINUR TRACE (A) 01/14/2024 1018   UROBILINOGEN 0.2 10/22/2013 1127   NITRITE NEGATIVE 01/14/2024 1018   LEUKOCYTESUR MODERATE (A) 01/14/2024 1018   Sepsis Labs: @LABRCNTIP (procalcitonin:4,lacticidven:4)  ) Recent Results (from the past 240 hours)  Urine Culture     Status: Abnormal   Collection Time: 01/14/24 10:18 AM   Specimen: Urine, Clean Catch  Result Value Ref Range Status   Specimen Description   Final    URINE, CLEAN CATCH Performed at [facility], [address] [place], [place] [address]    Special Requests   Final    NONE Performed at [facility], [address], [place], [place] [address]    Culture (A)  Final    <10,000 COLONIES/mL INSIGNIFICANT GROWTH Performed at [facility], [address] [place], [place] [address]    Report Status 01/15/2024 FINAL  Final     Radiology Studies: No results found.    Scheduled Meds:  apixaban   10 mg Oral BID   Followed by   NOREEN ON 01/23/2024] apixaban   5 mg Oral BID   atorvastatin   20 mg Oral Daily   dapagliflozin  propanediol  10 mg Oral Daily   furosemide   40 mg Oral Daily   levothyroxine   50 mcg Oral QAC breakfast   metoprolol  succinate  125 mg Oral Daily   oxybutynin   10 mg Oral QHS   pantoprazole   40 mg Oral BID AC   polyethylene glycol  17 g Oral Daily   Continuous Infusions:     LOS: 2 days    Time spent:    Sigurd Pac, MD [facility]   01/17/2024, 11:36 AM

## 2024-01-17 NOTE — Progress Notes (Signed)
 Rounding Note   Patient Name: [name] [name] Date of Encounter: 01/17/2024  [facility] Cardiologist: Lynwood Schilling, MD   Subjective Ambulated in the hallway.  No pain.  She was SOB and required O2 2 liters.  HR increased but came right back down  Scheduled Meds:  apixaban   10 mg Oral BID   Followed by   NOREEN ON 01/23/2024] apixaban   5 mg Oral BID   atorvastatin   20 mg Oral Daily   dapagliflozin  propanediol  10 mg Oral Daily   levothyroxine   50 mcg Oral QAC breakfast   metoprolol  succinate  100 mg Oral Daily   oxybutynin   10 mg Oral QHS   pantoprazole   40 mg Oral BID AC   polyethylene glycol  17 g Oral Daily   Continuous Infusions:   PRN Meds: acetaminophen  **OR** acetaminophen , fentaNYL  (SUBLIMAZE ) injection, HYDROcodone -acetaminophen , ondansetron  **OR** ondansetron  (ZOFRAN ) IV, mouth rinse, phenol   Vital Signs  Vitals:   01/16/24 2000 01/17/24 0047 01/17/24 0415 01/17/24 0725  BP: 137/79 (!) 137/95 132/84 135/76  Pulse: 97 94 88 90  Resp: 20 20 (!) 21 20  Temp: 98.7 F (37.1 C) 98.5 F (36.9 C) 98.6 F (37 C) 98.5 F (36.9 C)  TempSrc: Oral Oral Oral Oral  SpO2: 93% 93% 93% 94%  Weight:  91.7 kg    Height:        Intake/Output Summary (Last 24 hours) at 01/17/2024 0805 Last data filed at 01/17/2024 0417 Gross per 24 hour  Intake 360 ml  Output 2150 ml  Net -1790 ml      01/17/2024   12:47 AM 01/16/2024    4:12 AM 01/14/2024    7:15 PM  Last 3 Weights  Weight (lbs) 202 lb 1.6 oz 202 lb 9.6 oz 204 lb 14.4 oz  Weight (kg) 91.672 kg 91.9 kg 92.942 kg      Telemetry Atrial fib with rapid rate  ECG  NA - Personally Reviewed  Physical Exam  GEN: No  acute distress.   Neck: No  JVD Cardiac: IrregularRR, no murmurs, rubs, or gallops.  Respiratory:    Clear.  GI: Soft, nontender, non-distended, normal bowel sounds  MS:  No edema; No deformity. Neuro:   Nonfocal  Psych: Oriented and appropriate   Labs High Sensitivity Troponin:    Recent Labs  Lab 01/14/24 2108 01/14/24 2233  TROPONINIHS 9 10     Chemistry Recent Labs  Lab 01/12/24 1100 01/14/24 1116 01/14/24 2055 01/15/24 0311 01/16/24 0237 01/17/24 0240  NA 140   < >  --  139 139 139  K 3.9   < >  --  4.0 3.8 4.1  CL 110   < >  --  112* 109 107  CO2 21*   < >  --  21* 22 22  GLUCOSE 114*   < >  --  126* 111* 110*  BUN 40*   < >  --  22 22 30*  CREATININE 1.24*   < >  --  1.07* 1.24* 1.36*  CALCIUM  11.0*   < >  --  10.4* 10.5* 11.3*  MG  --   --  2.1 2.2  --   --   PROT 6.3*  --   --  5.7*  --   --   ALBUMIN  3.6  --   --  3.2*  --   --   AST 13*  --   --  9*  --   --  ALT 12  --   --  10  --   --   ALKPHOS 98  --   --  84  --   --   BILITOT 0.4  --   --  0.7  --   --   GFRNONAA 43*   < >  --  52* 43* 39*  ANIONGAP 9   < >  --  6 8 10    < > = values in this interval not displayed.    Lipids No results for input(s): CHOL, TRIG, HDL, LABVLDL, LDLCALC, CHOLHDL in the last 168 hours.  Hematology Recent Labs  Lab 01/15/24 0311 01/16/24 0237 01/17/24 0240  WBC 7.7 8.2 6.5  RBC 3.64* 3.63* 3.87  HGB 11.0* 11.4* 11.7*  HCT 35.1* 34.7* 36.8  MCV 96.4 95.6 95.1  MCH 30.2 31.4 30.2  MCHC 31.3 32.9 31.8  RDW 13.8 13.4 13.3  PLT 156 169 203   Thyroid   Recent Labs  Lab 01/14/24 2055  TSH 2.297    BNP Recent Labs  Lab 01/14/24 2108  BNP 458.3*    DDimer  Recent Labs  Lab 01/14/24 1116  DDIMER 6.73*     Radiology  VAS US  LOWER EXTREMITY VENOUS (DVT) Result Date: 01/15/2024  Lower Venous DVT Study Patient Name:  [name] [name]  Date of Exam:   01/15/2024 Medical Rec #: [id number]            Accession #:    [id number] Date of Birth: [birth year]            Patient Gender: F Patient Age:   83 years Exam Location:  [facility] Procedure:      VAS US  LOWER EXTREMITY VENOUS (DVT) Referring Phys: BLEASE DOUTOVA --------------------------------------------------------------------------------  Indications: Pulmonary  embolism.  Risk Factors: DVT Hx of BLE DVT in 2020. Performing Technologist: Elmarie Crump,RVT  Examination Guidelines: A complete evaluation includes B-mode imaging, spectral Doppler, color Doppler, and power Doppler as needed of all accessible portions of each vessel. Bilateral testing is considered an integral part of a complete examination. Limited examinations for reoccurring indications may be performed as noted. The reflux portion of the exam is performed with the patient in reverse Trendelenburg.  +---------+---------------+---------+-----------+----------+-------------------+ RIGHT    CompressibilityPhasicitySpontaneityPropertiesThrombus Aging      +---------+---------------+---------+-----------+----------+-------------------+ CFV      Partial        Yes      Yes                                      +---------+---------------+---------+-----------+----------+-------------------+ SFJ      Full                                                             +---------+---------------+---------+-----------+----------+-------------------+ FV Prox  Full                                                             +---------+---------------+---------+-----------+----------+-------------------+ FV Mid   Full                                                             +---------+---------------+---------+-----------+----------+-------------------+  FV DistalFull                                                             +---------+---------------+---------+-----------+----------+-------------------+ PFV      Full                                                             +---------+---------------+---------+-----------+----------+-------------------+ POP      Full           Yes      Yes                                      +---------+---------------+---------+-----------+----------+-------------------+ PTV      Full                                                              +---------+---------------+---------+-----------+----------+-------------------+ PERO                                                  Not well visualized +---------+---------------+---------+-----------+----------+-------------------+ There is free floating thrombus that appears to be attached to a valve in the CFV. The peroneal veins are not seen well.  +---------+---------------+---------+-----------+----------+-------------------+ LEFT     CompressibilityPhasicitySpontaneityPropertiesThrombus Aging      +---------+---------------+---------+-----------+----------+-------------------+ CFV      Full           Yes      Yes                                      +---------+---------------+---------+-----------+----------+-------------------+ SFJ      Full                                                             +---------+---------------+---------+-----------+----------+-------------------+ FV Prox  Full                                                             +---------+---------------+---------+-----------+----------+-------------------+ FV Mid   Partial                                                          +---------+---------------+---------+-----------+----------+-------------------+  FV Distal                                             Not well visualized +---------+---------------+---------+-----------+----------+-------------------+ PFV      Full                                                             +---------+---------------+---------+-----------+----------+-------------------+ POP      None                                                             +---------+---------------+---------+-----------+----------+-------------------+ PTV      Full                                                             +---------+---------------+---------+-----------+----------+-------------------+ PERO                                                   Not well visualized +---------+---------------+---------+-----------+----------+-------------------+ Acute appearing, partially compressible thrombus in the mid femoral vein and noncompresssible thrombus in the popliteal vein. The distal femoral and peroneal veins are not seen well.    Summary: RIGHT: - Findings consistent with acute deep vein thrombosis involving the right common femoral vein.  - No cystic structure found in the popliteal fossa.  LEFT: - Findings consistent with acute deep vein thrombosis involving the left femoral vein, and left popliteal vein.  - The positive findings were reported to Dr. Blease Quiver, MD  *See table(s) above for measurements and observations. Electronically signed by Lonni Gaskins MD on 01/15/2024 at 2:45:55 PM.    Final    ECHOCARDIOGRAM COMPLETE Result Date: 01/15/2024    ECHOCARDIOGRAM REPORT   Patient Name:   AUSTEN WYGANT Date of Exam: 01/15/2024 Medical Rec #:  [id number]           Height:       63.0 in Accession #:    [id number]          Weight:       204.9 lb Date of Birth:  Jan 31, 1941           BSA:          1.954 m Patient Age:    83 years            BP:           118/66 mmHg Patient Gender: F                   HR:           86 bpm. Exam Location:  Inpatient Procedure: 2D Echo, Cardiac Doppler, Color Doppler and Intracardiac  Opacification Agent (Both Spectral and Color Flow Doppler were            utilized during procedure). Indications:    Pulmonary Embolus I26.09  History:        Patient has prior history of Echocardiogram examinations, most                 recent 08/18/2023.  Sonographer:    Tinnie Gosling RDCS Referring Phys: [id number] ANASTASSIA DOUTOVA IMPRESSIONS  1. Left ventricular ejection fraction, by estimation, is 40 to 45%. The left ventricle has mildly decreased function. The left ventricle demonstrates global hypokinesis. There is mild left ventricular hypertrophy. Left ventricular diastolic  parameters are consistent with Grade I diastolic dysfunction (impaired relaxation).  2. Right ventricular systolic function is mildly reduced. The right ventricular size is normal. Mildly increased right ventricular wall thickness.  3. Left atrial size was severely dilated.  4. Right atrial size was mildly dilated.  5. The mitral valve is normal in structure. Moderate mitral valve regurgitation. Moderate to severe mitral stenosis.  6. The aortic valve is tricuspid. Aortic valve regurgitation is not visualized. Aortic valve sclerosis is present, with no evidence of aortic valve stenosis. FINDINGS  Left Ventricle: Left ventricular ejection fraction, by estimation, is 40 to 45%. The left ventricle has mildly decreased function. The left ventricle demonstrates global hypokinesis. The left ventricular internal cavity size was normal in size. There is  mild left ventricular hypertrophy. Left ventricular diastolic parameters are consistent with Grade I diastolic dysfunction (impaired relaxation). Right Ventricle: The right ventricular size is normal. Mildly increased right ventricular wall thickness. Right ventricular systolic function is mildly reduced. Left Atrium: Left atrial size was severely dilated. Right Atrium: Right atrial size was mildly dilated. Pericardium: There is no evidence of pericardial effusion. Mitral Valve: Velocity not optimized for PISA method but MR is worse than previous and is likely in the moderate to severe range. The mitral valve is normal in structure. Mildly decreased mobility of the mitral valve leaflets. Moderate mitral valve regurgitation. Moderate to severe mitral valve stenosis. Tricuspid Valve: The tricuspid valve is normal in structure. Tricuspid valve regurgitation is mild. Aortic Valve: The aortic valve is tricuspid. Aortic valve regurgitation is not visualized. Aortic valve sclerosis is present, with no evidence of aortic valve stenosis. Pulmonic Valve: The pulmonic valve was normal  in structure. Pulmonic valve regurgitation is mild. Aorta: The aortic root and ascending aorta are structurally normal, with no evidence of dilitation. IAS/Shunts: No atrial level shunt detected by color flow Doppler.  LEFT VENTRICLE PLAX 2D LVIDd:         5.30 cm LVIDs:         4.40 cm LV PW:         1.00 cm LV IVS:        1.00 cm LVOT diam:     2.20 cm LV SV:         39 LV SV Index:   20 LVOT Area:     3.80 cm  LV Volumes (MOD) LV vol d, MOD A2C: 103.0 ml LV vol d, MOD A4C: 145.0 ml LV vol s, MOD A2C: 62.8 ml LV vol s, MOD A4C: 71.8 ml LV SV MOD A2C:     40.2 ml LV SV MOD A4C:     145.0 ml LV SV MOD BP:      58.4 ml RIGHT VENTRICLE         IVC TAPSE (M-mode): 1.4 cm  IVC diam: 2.50 cm LEFT  ATRIUM              Index        RIGHT ATRIUM           Index LA diam:        3.50 cm  1.79 cm/m   RA Area:     22.10 cm LA Vol (A2C):   141.0 ml 72.17 ml/m  RA Volume:   59.80 ml  30.61 ml/m LA Vol (A4C):   150.0 ml 76.78 ml/m LA Biplane Vol: 152.0 ml 77.80 ml/m  AORTIC VALVE LVOT Vmax:   70.30 cm/s LVOT Vmean:  43.300 cm/s LVOT VTI:    0.103 m  AORTA Ao Root diam: 3.40 cm Ao Asc diam:  3.80 cm MITRAL VALVE MV Area (PHT): 6.71 cm     SHUNTS MV Decel Time: 113 msec     Systemic VTI:  0.10 m MV E velocity: 104.00 cm/s  Systemic Diam: 2.20 cm MV A velocity: 41.40 cm/s MV E/A ratio:  2.51 Morene Brownie Electronically signed by Morene Brownie Signature Date/Time: 01/15/2024/11:15:51 AM    Final     Cardiac Studies See echo above.   Patient Profile   83 y.o. female with a hx of HFimpEF, mitral valve disease, hypertension, DVT/PE 7/21, history of GI bleed, iron deficiency anemia, CKD stage III and history of persistent atrial fibrillation s/p Watchman device 06/15/2023 who is being seen 01/15/2024 for the evaluation of afib with RVR at the request of Dr. Fairy.   Assessment & Plan  Persistent atrial fibrillation with RVR :   Changed to PO Toprol  XL yesterday.  Rate is somewhat improved. I will increase the  metoprolol  dose slightly today.    Acute PE:   Recurrent event.  Plan lifetime anticoagulation.    HFimpEF: EF 40 - 45%.    Net negative 2.2 liters.  Resumed Farxiga .  Restarted Cozaar .  Given IV Lasix  once yesterday. Ordered again for today.     History of GI bleed/iron deficiency anemia:  Hgb stable.     Hypertension:  This is being managed in the context of treating his CHF   CKD stage III: Renal function stable creat up slightly.  Continue to follow.        For questions or updates, please contact [facility] Please consult [email or web address] for contact info under     Signed, Lynwood Schilling, MD  01/17/2024, 8:05 AM

## 2024-01-17 NOTE — Plan of Care (Signed)

## 2024-01-17 NOTE — TOC Transition Note (Signed)
 Transition of Care [facility]) - Discharge Note   Patient Details  Name: [name] [name] MRN: [id number] Date of Birth: August 10, 1940  [facility]) CM/SW Contact:  Waddell Barnie Rama, RN Phone Number: 01/17/2024, 10:04 AM   Clinical Narrative:    For possible dc today, has transport home will be on eliquis . Copay is 47.00 for refills, patient is aware.         Patient Goals and CMS Choice            Discharge Placement                       Discharge Plan and Services Additional resources added to the After Visit Summary for                                       Social Drivers of Health (SDOH) Interventions SDOH Screenings   Food Insecurity: No Food Insecurity (01/15/2024)  Housing: Low Risk  (01/15/2024)  Transportation Needs: No Transportation Needs (01/15/2024)  Utilities: Not At Risk (01/15/2024)  Alcohol  Screen: Low Risk  (12/27/2022)  Depression (PHQ2-9): Low Risk  (12/28/2023)  Financial Resource Strain: Low Risk  (11/23/2023)  Physical Activity: Insufficiently Active (11/23/2023)  Social Connections: Socially Isolated (01/15/2024)  Stress: No Stress Concern Present (11/23/2023)  Tobacco Use: Low Risk  (01/14/2024)  Health Literacy: Adequate Health Literacy (12/28/2023)     Readmission Risk Interventions    01/16/2024    4:05 PM  Readmission Risk Prevention Plan  Transportation Screening Complete  PCP or Specialist Appt within 5-7 Days Complete  Home Care Screening Complete  Medication Review (RN CM) Complete

## 2024-01-18 DIAGNOSIS — I2699 Other pulmonary embolism without acute cor pulmonale: Secondary | ICD-10-CM | POA: Diagnosis not present

## 2024-01-18 DIAGNOSIS — I482 Chronic atrial fibrillation, unspecified: Secondary | ICD-10-CM | POA: Diagnosis not present

## 2024-01-18 LAB — BASIC METABOLIC PANEL WITH GFR
Anion gap: 5 (ref 5–15)
BUN: 44 mg/dL — ABNORMAL HIGH (ref 8–23)
CO2: 23 mmol/L (ref 22–32)
Calcium: 11.3 mg/dL — ABNORMAL HIGH (ref 8.9–10.3)
Chloride: 107 mmol/L (ref 98–111)
Creatinine, Ser: 1.61 mg/dL — ABNORMAL HIGH (ref 0.44–1.00)
GFR, Estimated: 32 mL/min — ABNORMAL LOW (ref >60)
Glucose, Bld: 134 mg/dL — ABNORMAL HIGH (ref 70–99)
Potassium: 3.9 mmol/L (ref 3.5–5.1)
Sodium: 135 mmol/L (ref 135–145)

## 2024-01-18 LAB — CBC
HCT: 35.3 % — ABNORMAL LOW (ref 36.0–46.0)
Hemoglobin: 11.5 g/dL — ABNORMAL LOW (ref 12.0–15.0)
MCH: 30.6 pg (ref 26.0–34.0)
MCHC: 32.6 g/dL (ref 30.0–36.0)
MCV: 93.9 fL (ref 80.0–100.0)
Platelets: 228 10*3/uL (ref 150–400)
RBC: 3.76 MIL/uL — ABNORMAL LOW (ref 3.87–5.11)
RDW: 13.4 % (ref 11.5–15.5)
WBC: 7.1 10*3/uL (ref 4.0–10.5)
nRBC: 0 % (ref 0.0–0.2)

## 2024-01-18 MED ORDER — METOPROLOL SUCCINATE ER 50 MG PO TB24
150.0000 mg | ORAL_TABLET | Freq: Every day | ORAL | Status: DC
  Administered 2024-01-19: 150 mg via ORAL
  Filled 2024-01-18: qty 1

## 2024-01-18 NOTE — Progress Notes (Signed)
 Rounding Note   Patient Name: [name] [name] Date of Encounter: 01/18/2024  [facility] Cardiologist: Lynwood Schilling, MD   Subjective Mildly SOB with activity on 2 liters continuous .  No pain.   Scheduled Meds:  apixaban   10 mg Oral BID   Followed by   NOREEN ON 01/23/2024] apixaban   5 mg Oral BID   atorvastatin   20 mg Oral Daily   dapagliflozin  propanediol  10 mg Oral Daily   furosemide   40 mg Oral Daily   levothyroxine   50 mcg Oral QAC breakfast   metoprolol  succinate  125 mg Oral Daily   oxybutynin   10 mg Oral QHS   pantoprazole   40 mg Oral BID AC   polyethylene glycol  17 g Oral Daily   Continuous Infusions:   PRN Meds: acetaminophen  **OR** acetaminophen , fentaNYL  (SUBLIMAZE ) injection, HYDROcodone -acetaminophen , ondansetron  **OR** ondansetron  (ZOFRAN ) IV, mouth rinse, phenol   Vital Signs  Vitals:   01/18/24 0111 01/18/24 0404 01/18/24 0743 01/18/24 1057  BP: 137/78 128/78 (!) 141/82 (!) 141/82  Pulse: 98 81 86 86  Resp: 18 (!) 21 20   Temp: 97.6 F (36.4 C) 98 F (36.7 C) 98 F (36.7 C)   TempSrc: Oral Oral Oral   SpO2: 99% 95% 97%   Weight: 90.7 kg     Height:        Intake/Output Summary (Last 24 hours) at 01/18/2024 1148 Last data filed at 01/18/2024 0900 Gross per 24 hour  Intake 1256 ml  Output 1200 ml  Net 56 ml      01/18/2024    1:11 AM 01/17/2024   12:47 AM 01/16/2024    4:12 AM  Last 3 Weights  Weight (lbs) 200 lb 202 lb 1.6 oz 202 lb 9.6 oz  Weight (kg) 90.719 kg 91.672 kg 91.9 kg      Telemetry Atrial fib with rapid rate  ECG  NA - Personally Reviewed  Physical Exam  GEN: No  acute distress.   Neck: No  JVD Cardiac:   Irregular RR, no murmurs, rubs, or gallops.  Respiratory: Clear   to auscultation bilaterally. GI: Soft, nontender, non-distended, normal bowel sounds  MS:  No edema; No deformity. Neuro:   Nonfocal  Psych: Oriented and appropriate    Labs High Sensitivity Troponin:   Recent Labs  Lab  01/14/24 2108 01/14/24 2233  TROPONINIHS 9 10     Chemistry Recent Labs  Lab 01/12/24 1100 01/14/24 1116 01/14/24 2055 01/15/24 0311 01/16/24 0237 01/17/24 0240 01/18/24 0228  NA 140   < >  --  139 139 139 135  K 3.9   < >  --  4.0 3.8 4.1 3.9  CL 110   < >  --  112* 109 107 107  CO2 21*   < >  --  21* 22 22 23   GLUCOSE 114*   < >  --  126* 111* 110* 134*  BUN 40*   < >  --  22 22 30* 44*  CREATININE 1.24*   < >  --  1.07* 1.24* 1.36* 1.61*  CALCIUM  11.0*   < >  --  10.4* 10.5* 11.3* 11.3*  MG  --   --  2.1 2.2  --   --   --   PROT 6.3*  --   --  5.7*  --   --   --   ALBUMIN  3.6  --   --  3.2*  --   --   --  AST 13*  --   --  9*  --   --   --   ALT 12  --   --  10  --   --   --   ALKPHOS 98  --   --  84  --   --   --   BILITOT 0.4  --   --  0.7  --   --   --   GFRNONAA 43*   < >  --  52* 43* 39* 32*  ANIONGAP 9   < >  --  6 8 10 5    < > = values in this interval not displayed.    Lipids No results for input(s): CHOL, TRIG, HDL, LABVLDL, LDLCALC, CHOLHDL in the last 168 hours.  Hematology Recent Labs  Lab 01/16/24 0237 01/17/24 0240 01/18/24 0228  WBC 8.2 6.5 7.1  RBC 3.63* 3.87 3.76*  HGB 11.4* 11.7* 11.5*  HCT 34.7* 36.8 35.3*  MCV 95.6 95.1 93.9  MCH 31.4 30.2 30.6  MCHC 32.9 31.8 32.6  RDW 13.4 13.3 13.4  PLT 169 203 228   Thyroid   Recent Labs  Lab 01/14/24 2055  TSH 2.297    BNP Recent Labs  Lab 01/14/24 2108  BNP 458.3*    DDimer  Recent Labs  Lab 01/14/24 1116  DDIMER 6.73*     Radiology  No results found.   Cardiac Studies See echo above.   Patient Profile   83 y.o. female with a hx of HFimpEF, mitral valve disease, hypertension, DVT/PE 7/21, history of GI bleed, iron deficiency anemia, CKD stage III and history of persistent atrial fibrillation s/p Watchman device 06/15/2023 who is being seen 01/15/2024 for the evaluation of afib with RVR at the request of Dr. Fairy.   Assessment & Plan  Persistent atrial  fibrillation with RVR :   Changed to PO Toprol  XL .  Increased dose.   I would suggest 150 mg Toprol  XL at discharge.     Acute PE:   Recurrent event. Lifetime anticoag.     HFimpEF: EF 40 - 45%.    Net negative 1.9 liters.   Creat is mildly elevated.  Stop diuretic.      History of GI bleed/iron deficiency anemia:  Hgb stable   Hypertension:  This is being managed in the context of treating her CHF   CKD stage III: Renal function stable creat up slightly.  Stop Lasix  as above.      For questions or updates, please contact [facility] Please consult [email or web address] for contact info under     Signed, Lynwood Schilling, MD  01/18/2024, 11:48 AM

## 2024-01-18 NOTE — Evaluation (Signed)
 Patient was ambulated with mobility, CCMD called about increased HR to 180. Patient had no chest pain, but was tired and had increased heart palpitations. She said, I did feel my HR was high. I was not consulted prior to ambulation.  We put the patient in the wheelchair, and mobility got her back to her room and in the chair.

## 2024-01-18 NOTE — Care Management Important Message (Signed)
 Important Message  Patient Details  Name: [name] [name] MRN: [id number] Date of Birth: 11/03/1940   Important Message Given:  Yes - Medicare IM     Vonzell Arrie Sharps 01/18/2024, 10:43 AM

## 2024-01-18 NOTE — Plan of Care (Signed)

## 2024-01-18 NOTE — Progress Notes (Signed)
 Mobility Specialist Progress Note:   01/18/24 1009  Mobility  Activity Ambulated with assistance in hallway  Level of Assistance Contact guard assist, steadying assist  Assistive Device Front wheel walker  Distance Ambulated (ft) 200 ft  Activity Response Tolerated well  Mobility Referral Yes  Mobility visit 1 Mobility  Mobility Specialist Start Time (ACUTE ONLY) 1009  Mobility Specialist Stop Time (ACUTE ONLY) 1032  Mobility Specialist Time Calculation (min) (ACUTE ONLY) 23 min   Pt agreeable to session. No c/o of symptoms during mobility. HR suddenly increased to 180 during walk. Place in chair and rolled back to the room to be placed in recliner. Pt upset that walk was cut short because she was feeling fine. Vitals quickly returned to normal upon sitting in recliner. Pt left in room comfortably w/ needs met  Pre Mobility: HR laying 85/ SpO2 98 2LNC During Mobility:HR ambulating 140's - 180/ SpO2 92 2LNC Post Mobility:HR recliner 90's - 104's/ SpO2 99 2LNC  [name] [name] Mobility Specialist Please contact via Special educational needs teacher or  Rehab office at [phone]

## 2024-01-18 NOTE — Progress Notes (Signed)
 PROGRESS NOTE    [name] [name]  FMW:[id number] DOB: 21-Mar-1941 DOA: 01/14/2024 PCP: Joesph Annabella HERO, FNP   Brief Narrative:  83 year old female with history of chronic systolic CHF with recovered EF, A-fib with watchman's device currently not on Eliquis  following her Watchman procedure, iron deficiency anemia, GAVEs' disease, history of PE, CKD IIIa, hypertension presented with worsening lower extremity swelling and right-sided chest pain.  She was found to be in A-fib with RVR, hypoxic requiring supplemental oxygen .  CTA chest showed bilateral pulmonary embolism of moderate volume, small bilateral pleural effusions.  She was started on IV heparin  and IV Lasix .  Cardiology was consulted.  Assessment & Plan:   Acute hypoxic respiratory failure Acute bilateral pulmonary embolism -After coming off anticoagulation in January 2025 - Completed 48 hours of IV heparin , switched to oral Eliquis  already -Ideally needs long-term anticoagulation now - 2D echo with EF 40-45%, grade 1 DD, mildly reduced RV, moderate MR -Respiratory status improving.  Currently on room air.   Persistent A-fib with RVR - History of Watchman device - Heart rate remains elevated, cards following, Toprol  dose increased to 150 mg daily by cardiology. - Back on anticoagulation for PE/DVT   Acute on chronic combined CHF - Previously with recovered EF - Last echo 1/25 with EF 50-55%, repeat echo now with EF down to 40-45%, grade 1 DD, mildly reduced RV, moderate MR - Diuretics as per cardiology.  Creatinine trending upwards.  Continue Farxiga .   Mild AKI - Creatinine trending upwards.  Monitor.   Chronic iron deficiency anemia from GI blood loss GAVE's disease - On periodic iron infusions, hemoglobin relatively stable at this time   Type 2 diabetes mellitus - Farxiga , SSI.  Carb modified diet   Obesity -Outpatient follow-up  Physical deconditioning -PT eval   DVT prophylaxis: Eliquis  Code Status:  DNR Family Communication: None at bedside Disposition Plan: Status is: Inpatient Remains inpatient appropriate because: Of severity of illness    Consultants: Cardiology/pulmonary  Procedures: As above  Antimicrobials: None   Subjective: Patient seen and examined at bedside.  Denies worsening shortness of breath, chest pain or vomiting.  No fever reported.  Objective: Vitals:   01/18/24 0111 01/18/24 0404 01/18/24 0743 01/18/24 1057  BP: 137/78 128/78 (!) 141/82 (!) 141/82  Pulse: 98 81 86 86  Resp: 18 (!) 21 20   Temp: 97.6 F (36.4 C) 98 F (36.7 C) 98 F (36.7 C)   TempSrc: Oral Oral Oral   SpO2: 99% 95% 97%   Weight: 90.7 kg     Height:        Intake/Output Summary (Last 24 hours) at 01/18/2024 1208 Last data filed at 01/18/2024 0900 Gross per 24 hour  Intake 1016 ml  Output 1200 ml  Net -184 ml   Filed Weights   01/16/24 0412 01/17/24 0047 01/18/24 0111  Weight: 91.9 kg 91.7 kg 90.7 kg    Examination:  General exam: Appears calm and comfortable.  Looks chronically ill and deconditioned. Respiratory system: Bilateral decreased breath sounds at bases with scattered crackles Cardiovascular system: S1 & S2 heard, intermittently tachycardic gastrointestinal system: Abdomen is obese, nondistended, soft and nontender. Normal bowel sounds heard. Extremities: No cyanosis, clubbing; mild lower extremity edema Central nervous system: Alert and oriented. No focal neurological deficits. Moving extremities Skin: No rashes, lesions or ulcers Psychiatry: Flat affect.  Not agitated.    Data Reviewed: I have personally reviewed following labs and imaging studies  CBC: Recent Labs  Lab 01/12/24 1100 01/14/24 1116  01/15/24 0311 01/16/24 0237 01/17/24 0240 01/18/24 0228  WBC 6.7 7.9 7.7 8.2 6.5 7.1  NEUTROABS 4.7  --   --   --   --   --   HGB 12.1 12.1 11.0* 11.4* 11.7* 11.5*  HCT 37.2 37.4 35.1* 34.7* 36.8 35.3*  MCV 95.6 94.9 96.4 95.6 95.1 93.9  PLT 172 156  156 169 203 228   Basic Metabolic Panel: Recent Labs  Lab 01/14/24 1116 01/14/24 2055 01/15/24 0311 01/16/24 0237 01/17/24 0240 01/18/24 0228  NA 141  --  139 139 139 135  K 4.3  --  4.0 3.8 4.1 3.9  CL 106  --  112* 109 107 107  CO2 22  --  21* 22 22 23   GLUCOSE 110*  --  126* 111* 110* 134*  BUN 34*  --  22 22 30* 44*  CREATININE 1.43*  --  1.07* 1.24* 1.36* 1.61*  CALCIUM  11.8*  --  10.4* 10.5* 11.3* 11.3*  MG  --  2.1 2.2  --   --   --   PHOS  --  2.7 3.1  --   --   --    GFR: Estimated Creatinine Clearance: 28.8 mL/min (A) (by C-G formula based on SCr of 1.61 mg/dL (H)). Liver Function Tests: Recent Labs  Lab 01/12/24 1100 01/15/24 0311  AST 13* 9*  ALT 12 10  ALKPHOS 98 84  BILITOT 0.4 0.7  PROT 6.3* 5.7*  ALBUMIN  3.6 3.2*   No results for input(s): LIPASE, AMYLASE in the last 168 hours. No results for input(s): AMMONIA in the last 168 hours. Coagulation Profile: No results for input(s): INR, PROTIME in the last 168 hours. Cardiac Enzymes: No results for input(s): CKTOTAL, CKMB, CKMBINDEX, TROPONINI in the last 168 hours. BNP (last 3 results) No results for input(s): PROBNP in the last 8760 hours. HbA1C: No results for input(s): HGBA1C in the last 72 hours. CBG: No results for input(s): GLUCAP in the last 168 hours. Lipid Profile: No results for input(s): CHOL, HDL, LDLCALC, TRIG, CHOLHDL, LDLDIRECT in the last 72 hours. Thyroid  Function Tests: No results for input(s): TSH, T4TOTAL, FREET4, T3FREE, THYROIDAB in the last 72 hours. Anemia Panel: No results for input(s): VITAMINB12, FOLATE, FERRITIN, TIBC, IRON, RETICCTPCT in the last 72 hours. Sepsis Labs: No results for input(s): PROCALCITON, LATICACIDVEN in the last 168 hours.  Recent Results (from the past 240 hours)  Urine Culture     Status: Abnormal   Collection Time: 01/14/24 10:18 AM   Specimen: Urine, Clean Catch  Result Value Ref  Range Status   Specimen Description   Final    URINE, CLEAN CATCH Performed at [facility], [address] [place], [place] [address]    Special Requests   Final    NONE Performed at [facility], [address], [place], [place] [address]    Culture (A)  Final    <10,000 COLONIES/mL INSIGNIFICANT GROWTH Performed at [facility], [address] [place], [place] [address]    Report Status 01/15/2024 FINAL  Final         Radiology Studies: No results found.      Scheduled Meds:  apixaban   10 mg Oral BID   Followed by   NOREEN ON 01/23/2024] apixaban   5 mg Oral BID   atorvastatin   20 mg Oral Daily   dapagliflozin  propanediol  10 mg Oral Daily   levothyroxine   50 mcg Oral QAC breakfast   [START ON 01/19/2024]  metoprolol  succinate  150 mg Oral Daily   oxybutynin   10 mg Oral QHS   pantoprazole   40 mg Oral BID AC   polyethylene glycol  17 g Oral Daily   Continuous Infusions:        Sophie Mao, MD [facility] 01/18/2024, 12:08 PM

## 2024-01-19 ENCOUNTER — Inpatient Hospital Stay: Admitting: Oncology

## 2024-01-19 ENCOUNTER — Other Ambulatory Visit (HOSPITAL_COMMUNITY): Payer: Self-pay

## 2024-01-19 ENCOUNTER — Encounter: Payer: Self-pay | Admitting: Hematology

## 2024-01-19 DIAGNOSIS — I2699 Other pulmonary embolism without acute cor pulmonale: Secondary | ICD-10-CM | POA: Diagnosis not present

## 2024-01-19 DIAGNOSIS — I4891 Unspecified atrial fibrillation: Secondary | ICD-10-CM | POA: Diagnosis not present

## 2024-01-19 LAB — CBC
HCT: 33.9 % — ABNORMAL LOW (ref 36.0–46.0)
Hemoglobin: 11 g/dL — ABNORMAL LOW (ref 12.0–15.0)
MCH: 30.6 pg (ref 26.0–34.0)
MCHC: 32.4 g/dL (ref 30.0–36.0)
MCV: 94.4 fL (ref 80.0–100.0)
Platelets: 243 10*3/uL (ref 150–400)
RBC: 3.59 MIL/uL — ABNORMAL LOW (ref 3.87–5.11)
RDW: 13.3 % (ref 11.5–15.5)
WBC: 6.1 10*3/uL (ref 4.0–10.5)
nRBC: 0 % (ref 0.0–0.2)

## 2024-01-19 LAB — BASIC METABOLIC PANEL WITH GFR
Anion gap: 9 (ref 5–15)
BUN: 49 mg/dL — ABNORMAL HIGH (ref 8–23)
CO2: 22 mmol/L (ref 22–32)
Calcium: 11.7 mg/dL — ABNORMAL HIGH (ref 8.9–10.3)
Chloride: 106 mmol/L (ref 98–111)
Creatinine, Ser: 1.43 mg/dL — ABNORMAL HIGH (ref 0.44–1.00)
GFR, Estimated: 37 mL/min — ABNORMAL LOW (ref >60)
Glucose, Bld: 108 mg/dL — ABNORMAL HIGH (ref 70–99)
Potassium: 4.3 mmol/L (ref 3.5–5.1)
Sodium: 137 mmol/L (ref 135–145)

## 2024-01-19 LAB — MAGNESIUM: Magnesium: 2.3 mg/dL (ref 1.7–2.4)

## 2024-01-19 MED ORDER — METOPROLOL SUCCINATE ER 50 MG PO TB24
150.0000 mg | ORAL_TABLET | Freq: Every day | ORAL | 0 refills | Status: DC
  Filled 2024-01-19: qty 90, 30d supply, fill #0

## 2024-01-19 MED ORDER — FUROSEMIDE 20 MG PO TABS: 20.0000 mg | ORAL_TABLET | Freq: Every day | ORAL | Status: AC | PRN

## 2024-01-19 MED ORDER — POLYETHYLENE GLYCOL 3350 17 GM/SCOOP PO POWD
17.0000 g | Freq: Every day | ORAL | 0 refills | Status: AC | PRN
  Filled 2024-01-19: qty 238, 14d supply, fill #0

## 2024-01-19 MED ORDER — APIXABAN 5 MG PO TABS
ORAL_TABLET | ORAL | 0 refills | Status: DC
  Filled 2024-01-19: qty 90, 30d supply, fill #0

## 2024-01-19 NOTE — Progress Notes (Signed)
 Discharge instructions verbally reviewed with patient and she verbalized understanding, no further questions noted. PIV removed per policy. Son is en route to pick up patient.

## 2024-01-19 NOTE — TOC Transition Note (Signed)
 Transition of Care [facility]) - Discharge Note   Patient Details  Name: [name] [name] MRN: [id number] Date of Birth: 04-22-1941  [facility] of [facility]) CM/SW Contact:  Waddell Barnie Rama, RN Phone Number: 01/19/2024, 10:54 AM   Clinical Narrative:    For possible dc today, has transport home will be on eliquis . Copay is 47.00 for refills, patient is aware.           Patient Goals and CMS Choice            Discharge Placement                       Discharge Plan and Services Additional resources added to the After Visit Summary for                                       Social Drivers of Health (SDOH) Interventions SDOH Screenings   Food Insecurity: No Food Insecurity (01/15/2024)  Housing: Low Risk  (01/15/2024)  Transportation Needs: No Transportation Needs (01/15/2024)  Utilities: Not At Risk (01/15/2024)  Alcohol  Screen: Low Risk  (12/27/2022)  Depression (PHQ2-9): Low Risk  (12/28/2023)  Financial Resource Strain: Low Risk  (11/23/2023)  Physical Activity: Insufficiently Active (11/23/2023)  Social Connections: Socially Isolated (01/15/2024)  Stress: No Stress Concern Present (11/23/2023)  Tobacco Use: Low Risk  (01/14/2024)  Health Literacy: Adequate Health Literacy (12/28/2023)     Readmission Risk Interventions    01/16/2024    4:05 PM  Readmission Risk Prevention Plan  Transportation Screening Complete  PCP or Specialist Appt within 5-7 Days Complete  Home Care Screening Complete  Medication Review (RN CM) Complete

## 2024-01-19 NOTE — Discharge Summary (Signed)
 Physician Discharge Summary  [name] [name] FMW:[id number] DOB: 1941-06-09 DOA: 01/14/2024  PCP: Joesph Annabella HERO, FNP  Admit date: 01/14/2024 Discharge date: 01/19/2024  Admitted From: Home Disposition: Home  Recommendations for Outpatient Follow-up:  Follow up with PCP in 1 week with repeat CBC/BMP Outpatient follow-up with cardiology Follow up in ED if symptoms worsen or new appear   Home Health: No Equipment/Devices: None  Discharge Condition: Stable CODE STATUS: DNR Diet recommendation: Heart healthy/fluid restriction of up to 1200 cc a day  Brief/Interim Summary: 83 year old female with history of chronic systolic CHF with recovered EF, A-fib with watchman's device currently not on Eliquis  following her Watchman procedure, iron deficiency anemia, GAVEs' disease, history of PE, CKD IIIa, hypertension presented with worsening lower extremity swelling and right-sided chest pain.  She was found to be in A-fib with RVR, hypoxic requiring supplemental oxygen .  CTA chest showed bilateral pulmonary embolism of moderate volume, small bilateral pleural effusions.  She was started on IV heparin  and IV Lasix .  Cardiology was consulted.  During the hospitalization, her condition is improved.  She is currently on high dose of oral Toprol  XL with stable heart rate.  She required IV heparin  which has already been switched to oral Eliquis .  She also was treated with IV Lasix  with good diuresis.  Subsequently, diuretics have been discontinued by cardiology.  Cardiology has cleared the patient for discharge home today.  She will be discharged home today with outpatient follow-up with cardiology.    Discharge Diagnoses:   Acute hypoxic respiratory failure Acute bilateral pulmonary embolism -After coming off anticoagulation in January 2025 - Completed 48 hours of IV heparin , switched to oral Eliquis  already -Ideally needs long-term anticoagulation now - 2D echo with EF 40-45%, grade 1 DD, mildly  reduced RV, moderate MR -Respiratory status improving.  Currently on room air.   Persistent A-fib with RVR - History of Watchman device - Toprol  dose increased to 150 mg daily by cardiology.  Heart rate has stabilized. - Back on anticoagulation for PE/DVT - Cardiology has cleared the patient for discharge home today.    Acute on chronic combined CHF - Previously with recovered EF - Last echo 1/25 with EF 50-55%, repeat echo now with EF down to 40-45%, grade 1 DD, mildly reduced RV, moderate MR - She also was treated with IV Lasix  with good diuresis.  Subsequently, diuretics have been discontinued by cardiology.  Cardiology has cleared the patient for discharge home today.  She will be discharged home today with outpatient follow-up with cardiology. Continue Farxiga .  Cardiology recommends Lasix  20 mg daily as needed.     Mild AKI - Creatinine improving.  Outpatient follow-up.  Chronic iron deficiency anemia from GI blood loss GAVE's disease - On periodic iron infusions, hemoglobin relatively stable at this time   Type 2 diabetes mellitus - Continue Farxiga .  Outpatient follow-up.  Carb modified diet   Obesity -Outpatient follow-up   Discharge Instructions  Discharge Instructions     Diet - low sodium heart healthy   Complete by: As directed    Increase activity slowly   Complete by: As directed    No wound care   Complete by: As directed       Allergies as of 01/19/2024   No Known Allergies      Medication List     STOP taking these medications    amLODipine  5 MG tablet Commonly known as: NORVASC    losartan  25 MG tablet Commonly known as: COZAAR   TAKE these medications    acetaminophen  325 MG tablet Commonly known as: TYLENOL  Take 2 tablets (650 mg total) by mouth every 6 (six) hours as needed for mild pain or headache (or Fever >/= 101).   allopurinol  100 MG tablet Commonly known as: ZYLOPRIM  Take 1 tablet (100 mg total) by mouth daily.    apixaban  5 MG Tabs tablet Commonly known as: ELIQUIS  Take 2 tablets (10 mg total) by mouth 2 (two) times daily for 4 days, THEN 1 tablet (5 mg total) 2 (two) times daily. Start taking on: January 19, 2024   atorvastatin  20 MG tablet Commonly known as: LIPITOR Take 1 tablet (20 mg total) by mouth daily.   cetirizine  10 MG tablet Commonly known as: ZYRTEC  Take 10 mg by mouth daily.   dapagliflozin  propanediol 10 MG Tabs tablet Commonly known as: Farxiga  Take 1 tablet (10 mg total) by mouth daily.   ferrous sulfate  325 (65 FE) MG EC tablet Take 325 mg by mouth daily.   furosemide  20 MG tablet Commonly known as: LASIX  Take 1 tablet (20 mg total) by mouth daily as needed for fluid or edema. What changed:  when to take this reasons to take this   GNP CRANBERRY EXTRACT PO Take 1 tablet by mouth in the morning.   levothyroxine  50 MCG tablet Commonly known as: SYNTHROID  Take 1 tablet (50 mcg total) by mouth daily before breakfast.   metoprolol  succinate 50 MG 24 hr tablet Commonly known as: TOPROL -XL Take 3 tablets (150 mg total) by mouth daily. Start taking on: January 20, 2024 What changed:  medication strength how much to take   oxybutynin  10 MG 24 hr tablet Commonly known as: DITROPAN -XL Take 1 tablet (10 mg total) by mouth at bedtime.   pantoprazole  40 MG tablet Commonly known as: PROTONIX  Take 1 tablet (40 mg total) by mouth 2 (two) times daily before a meal.   polyethylene glycol 17 g packet Commonly known as: MIRALAX  / GLYCOLAX  Take 17 g by mouth daily as needed for moderate constipation.        Follow-up Information     Joesph Annabella HERO, FNP Follow up.   Specialty: Family Medicine Contact information: [address] [place] [address] [phone]         Joesph Annabella HERO, FNP. Schedule an appointment as soon as possible for a visit in 1 week(s).   Specialty: Family Medicine Contact information: [address] [place]  [address] [phone]                No Known Allergies  Consultations: Cardiology   Procedures/Studies: VAS US  LOWER EXTREMITY VENOUS (DVT) Result Date: 01/15/2024  Lower Venous DVT Study Patient Name:  [name] [name] [facility]  Date of Exam:   01/15/2024 Medical Rec #: [id number]            Accession #:    [id number] Date of Birth: [birth year]            Patient Gender: F Patient Age:   41 years Exam Location:  [facility] Procedure:      VAS US  LOWER EXTREMITY VENOUS (DVT) Referring Phys: ANASTASSIA DOUTOVA --------------------------------------------------------------------------------  Indications: Pulmonary embolism.  Risk Factors: DVT Hx of BLE DVT in 2020. Performing Technologist: Elmarie Crump,RVT  Examination Guidelines: A complete evaluation includes B-mode imaging, spectral Doppler, color Doppler, and power Doppler as needed of all accessible portions of each vessel. Bilateral testing is considered an integral part of a complete examination. Limited examinations  for reoccurring indications may be performed as noted. The reflux portion of the exam is performed with the patient in reverse Trendelenburg.  +---------+---------------+---------+-----------+----------+-------------------+ RIGHT    CompressibilityPhasicitySpontaneityPropertiesThrombus Aging      +---------+---------------+---------+-----------+----------+-------------------+ CFV      Partial        Yes      Yes                                      +---------+---------------+---------+-----------+----------+-------------------+ SFJ      Full                                                             +---------+---------------+---------+-----------+----------+-------------------+ FV Prox  Full                                                             +---------+---------------+---------+-----------+----------+-------------------+ FV Mid   Full                                                              +---------+---------------+---------+-----------+----------+-------------------+ FV DistalFull                                                             +---------+---------------+---------+-----------+----------+-------------------+ PFV      Full                                                             +---------+---------------+---------+-----------+----------+-------------------+ POP      Full           Yes      Yes                                      +---------+---------------+---------+-----------+----------+-------------------+ PTV      Full                                                             +---------+---------------+---------+-----------+----------+-------------------+ PERO  Not well visualized +---------+---------------+---------+-----------+----------+-------------------+ There is free floating thrombus that appears to be attached to a valve in the CFV. The peroneal veins are not seen well.  +---------+---------------+---------+-----------+----------+-------------------+ LEFT     CompressibilityPhasicitySpontaneityPropertiesThrombus Aging      +---------+---------------+---------+-----------+----------+-------------------+ CFV      Full           Yes      Yes                                      +---------+---------------+---------+-----------+----------+-------------------+ SFJ      Full                                                             +---------+---------------+---------+-----------+----------+-------------------+ FV Prox  Full                                                             +---------+---------------+---------+-----------+----------+-------------------+ FV Mid   Partial                                                          +---------+---------------+---------+-----------+----------+-------------------+ FV Distal                                              Not well visualized +---------+---------------+---------+-----------+----------+-------------------+ PFV      Full                                                             +---------+---------------+---------+-----------+----------+-------------------+ POP      None                                                             +---------+---------------+---------+-----------+----------+-------------------+ PTV      Full                                                             +---------+---------------+---------+-----------+----------+-------------------+ PERO  Not well visualized +---------+---------------+---------+-----------+----------+-------------------+ Acute appearing, partially compressible thrombus in the mid femoral vein and noncompresssible thrombus in the popliteal vein. The distal femoral and peroneal veins are not seen well.    Summary: RIGHT: - Findings consistent with acute deep vein thrombosis involving the right common femoral vein.  - No cystic structure found in the popliteal fossa.  LEFT: - Findings consistent with acute deep vein thrombosis involving the left femoral vein, and left popliteal vein.  - The positive findings were reported to Dr. Blease Quiver, MD  *See table(s) above for measurements and observations. Electronically signed by Lonni Gaskins MD on 01/15/2024 at 2:45:55 PM.    Final    ECHOCARDIOGRAM COMPLETE Result Date: 01/15/2024    ECHOCARDIOGRAM REPORT   Patient Name:   [name] [name] Date of Exam: 01/15/2024 Medical Rec #:  [id number]           Height:       63.0 in Accession #:    [id number]          Weight:       204.9 lb Date of Birth:  06-17-1941           BSA:          1.954 m Patient Age:    83 years            BP:           118/66 mmHg Patient Gender: F                   HR:           86 bpm. Exam Location:  Inpatient Procedure: 2D Echo, Cardiac Doppler,  Color Doppler and Intracardiac            Opacification Agent (Both Spectral and Color Flow Doppler were            utilized during procedure). Indications:    Pulmonary Embolus I26.09  History:        Patient has prior history of Echocardiogram examinations, most                 recent 08/18/2023.  Sonographer:    Tinnie Gosling RDCS Referring Phys: [id number] ANASTASSIA DOUTOVA IMPRESSIONS  1. Left ventricular ejection fraction, by estimation, is 40 to 45%. The left ventricle has mildly decreased function. The left ventricle demonstrates global hypokinesis. There is mild left ventricular hypertrophy. Left ventricular diastolic parameters are consistent with Grade I diastolic dysfunction (impaired relaxation).  2. Right ventricular systolic function is mildly reduced. The right ventricular size is normal. Mildly increased right ventricular wall thickness.  3. Left atrial size was severely dilated.  4. Right atrial size was mildly dilated.  5. The mitral valve is normal in structure. Moderate mitral valve regurgitation. Moderate to severe mitral stenosis.  6. The aortic valve is tricuspid. Aortic valve regurgitation is not visualized. Aortic valve sclerosis is present, with no evidence of aortic valve stenosis. FINDINGS  Left Ventricle: Left ventricular ejection fraction, by estimation, is 40 to 45%. The left ventricle has mildly decreased function. The left ventricle demonstrates global hypokinesis. The left ventricular internal cavity size was normal in size. There is  mild left ventricular hypertrophy. Left ventricular diastolic parameters are consistent with Grade I diastolic dysfunction (impaired relaxation). Right Ventricle: The right ventricular size is normal. Mildly increased right ventricular wall thickness. Right ventricular systolic function is mildly reduced. Left Atrium: Left atrial size was severely dilated. Right Atrium: Right atrial size was  mildly dilated. Pericardium: There is no evidence of pericardial  effusion. Mitral Valve: Velocity not optimized for PISA method but MR is worse than previous and is likely in the moderate to severe range. The mitral valve is normal in structure. Mildly decreased mobility of the mitral valve leaflets. Moderate mitral valve regurgitation. Moderate to severe mitral valve stenosis. Tricuspid Valve: The tricuspid valve is normal in structure. Tricuspid valve regurgitation is mild. Aortic Valve: The aortic valve is tricuspid. Aortic valve regurgitation is not visualized. Aortic valve sclerosis is present, with no evidence of aortic valve stenosis. Pulmonic Valve: The pulmonic valve was normal in structure. Pulmonic valve regurgitation is mild. Aorta: The aortic root and ascending aorta are structurally normal, with no evidence of dilitation. IAS/Shunts: No atrial level shunt detected by color flow Doppler.  LEFT VENTRICLE PLAX 2D LVIDd:         5.30 cm LVIDs:         4.40 cm LV PW:         1.00 cm LV IVS:        1.00 cm LVOT diam:     2.20 cm LV SV:         39 LV SV Index:   20 LVOT Area:     3.80 cm  LV Volumes (MOD) LV vol d, MOD A2C: 103.0 ml LV vol d, MOD A4C: 145.0 ml LV vol s, MOD A2C: 62.8 ml LV vol s, MOD A4C: 71.8 ml LV SV MOD A2C:     40.2 ml LV SV MOD A4C:     145.0 ml LV SV MOD BP:      58.4 ml RIGHT VENTRICLE         IVC TAPSE (M-mode): 1.4 cm  IVC diam: 2.50 cm LEFT ATRIUM              Index        RIGHT ATRIUM           Index LA diam:        3.50 cm  1.79 cm/m   RA Area:     22.10 cm LA Vol (A2C):   141.0 ml 72.17 ml/m  RA Volume:   59.80 ml  30.61 ml/m LA Vol (A4C):   150.0 ml 76.78 ml/m LA Biplane Vol: 152.0 ml 77.80 ml/m  AORTIC VALVE LVOT Vmax:   70.30 cm/s LVOT Vmean:  43.300 cm/s LVOT VTI:    0.103 m  AORTA Ao Root diam: 3.40 cm Ao Asc diam:  3.80 cm MITRAL VALVE MV Area (PHT): 6.71 cm     SHUNTS MV Decel Time: 113 msec     Systemic VTI:  0.10 m MV E velocity: 104.00 cm/s  Systemic Diam: 2.20 cm MV A velocity: 41.40 cm/s MV E/A ratio:  2.51 [name] [name]  Electronically signed by [name] [name] Signature Date/Time: 01/15/2024/11:15:51 AM    Final    CT Angio Chest PE W/Cm &/Or Wo Cm Addendum Date: 01/14/2024 ADDENDUM REPORT: 01/14/2024 15:54 ADDENDUM: Critical Value/emergent results were called by telephone at the time of interpretation on 01/14/2024 at 3:53 pm to provider Dr. Neysa, who verbally acknowledged these results. Electronically Signed   By: Leita Birmingham M.D.   On: 01/14/2024 15:54   Result Date: 01/14/2024 CLINICAL DATA:  Pulmonary embolism suspected, low to intermediate probability. Positive D-dimer. EXAM: CT ANGIOGRAPHY CHEST WITH CONTRAST TECHNIQUE: Multidetector CT imaging of the chest was performed using the standard protocol during bolus administration of intravenous contrast. Multiplanar CT image reconstructions and  MIPs were obtained to evaluate the vascular anatomy. RADIATION DOSE REDUCTION: This exam was performed according to the departmental dose-optimization program which includes automated exposure control, adjustment of the mA and/or kV according to patient size and/or use of iterative reconstruction technique. CONTRAST:  50mL OMNIPAQUE  IOHEXOL  350 MG/ML SOLN COMPARISON:  04/03/2019. FINDINGS: Cardiovascular: The heart is enlarged and there is a trace pericardial effusion. Scattered coronary artery calcifications are present. An occlusion device is present in the left atrial appendage. There is atherosclerotic calcification of the aorta without evidence of aneurysm. The pulmonary trunk is distended suggesting underlying pulmonary artery hypertension. Pulmonary artery filling defects are present in the right pulmonary artery extending into the lobar, segmental, of subsegmental arteries in the right upper, middle, and lower lobes. There are segmental and subsegmental pulmonary emboli in the left upper and lower lobes. There is no evidence of right heart strain. Mediastinum/Nodes: No mediastinal or axillary lymphadenopathy. A nonspecific  prominent lymph nodes present at the right hilum. There is a stable hypodense lesion in the left lobe of the thyroid  gland measuring 1.9 cm. The trachea and esophagus are within normal limits. Lungs/Pleura: There is a small pleural effusion on the right a trace pleural effusion on the left. No pneumothorax is seen. Atelectasis is present bilaterally. Upper Abdomen: No acute abnormality. Cystic structures are present in the left upper quadrant, which may be renal in origin and unchanged from the prior exam. Musculoskeletal: Degenerative changes are present in the thoracic spine. No acute osseous abnormality is seen. Review of the MIP images confirms the above findings. IMPRESSION: 1. Bilateral pulmonary emboli, moderate volume. No evidence of right heart strain. 2. Small left pleural effusion and trace right pleural effusion with atelectasis. 3. Distended pulmonary trunk suggesting underlying pulmonary artery hypertension. 4. Cardiomegaly with coronary artery calcifications. 5. Aortic atherosclerosis. Electronically Signed: By: Leita Birmingham M.D. On: 01/14/2024 15:49   DG Chest Port 1 View Result Date: 01/14/2024 CLINICAL DATA:  RIGHT anterior chest pain. EXAM: PORTABLE CHEST 1 VIEW COMPARISON:  None Available. FINDINGS: The heart size and mediastinal contours are within normal limits. Both lungs are clear. The visualized skeletal structures are unremarkable. Atrial appendage closure device noted IMPRESSION: Insert no acute Electronically Signed   By: Jackquline Boxer M.D.   On: 01/14/2024 14:14      Subjective: Patient seen and examined at bedside.  Feels better and wants to go home today.  No fever, vomiting, chest pain or shortness of breath reported.  Discharge Exam: Vitals:   01/19/24 0737 01/19/24 0950  BP: 118/76 113/65  Pulse: 88 81  Resp: 16 20  Temp: 98.3 F (36.8 C)   SpO2: 90% 92%    General: Pt is alert, awake, not in acute distress on room air. Cardiovascular: rate controlled  currently, S1/S2 + Respiratory: bilateral decreased breath sounds at bases with some scattered crackles Abdominal: Soft, obese, NT, ND, bowel sounds + Extremities: Trace lower extremity edema; no cyanosis    The results of significant diagnostics from this hospitalization (including imaging, microbiology, ancillary and laboratory) are listed below for reference.     Microbiology: Recent Results (from the past 240 hours)  Urine Culture     Status: Abnormal   Collection Time: 01/14/24 10:18 AM   Specimen: Urine, Clean Catch  Result Value Ref Range Status   Specimen Description   Final    URINE, CLEAN CATCH Performed at [facility], [address] [place], [place] [address]    Special Requests   Final  NONE Performed at [facility], [address], [place], [place] [address]    Culture (A)  Final    <10,000 COLONIES/mL INSIGNIFICANT GROWTH Performed at [facility], [address] [place], [place] [address]    Report Status 01/15/2024 FINAL  Final     Labs: BNP (last 3 results) Recent Labs    01/14/24 2108  BNP 458.3*   Basic Metabolic Panel: Recent Labs  Lab 01/14/24 2055 01/15/24 0311 01/16/24 0237 01/17/24 0240 01/18/24 0228 01/19/24 0316  NA  --  139 139 139 135 137  K  --  4.0 3.8 4.1 3.9 4.3  CL  --  112* 109 107 107 106  CO2  --  21* 22 22 23 22   GLUCOSE  --  126* 111* 110* 134* 108*  BUN  --  22 22 30* 44* 49*  CREATININE  --  1.07* 1.24* 1.36* 1.61* 1.43*  CALCIUM   --  10.4* 10.5* 11.3* 11.3* 11.7*  MG 2.1 2.2  --   --   --  2.3  PHOS 2.7 3.1  --   --   --   --    Liver Function Tests: Recent Labs  Lab 01/12/24 1100 01/15/24 0311  AST 13* 9*  ALT 12 10  ALKPHOS 98 84  BILITOT 0.4 0.7  PROT 6.3* 5.7*  ALBUMIN  3.6 3.2*   No results for input(s): LIPASE, AMYLASE in the last 168 hours. No results for input(s): AMMONIA in the last 168 hours. CBC: Recent Labs  Lab 01/12/24 1100  01/14/24 1116 01/15/24 0311 01/16/24 0237 01/17/24 0240 01/18/24 0228 01/19/24 0316  WBC 6.7   < > 7.7 8.2 6.5 7.1 6.1  NEUTROABS 4.7  --   --   --   --   --   --   HGB 12.1   < > 11.0* 11.4* 11.7* 11.5* 11.0*  HCT 37.2   < > 35.1* 34.7* 36.8 35.3* 33.9*  MCV 95.6   < > 96.4 95.6 95.1 93.9 94.4  PLT 172   < > 156 169 203 228 243   < > = values in this interval not displayed.   Cardiac Enzymes: No results for input(s): CKTOTAL, CKMB, CKMBINDEX, TROPONINI in the last 168 hours. BNP: Invalid input(s): POCBNP CBG: No results for input(s): GLUCAP in the last 168 hours. D-Dimer No results for input(s): DDIMER in the last 72 hours. Hgb A1c No results for input(s): HGBA1C in the last 72 hours. Lipid Profile No results for input(s): CHOL, HDL, LDLCALC, TRIG, CHOLHDL, LDLDIRECT in the last 72 hours. Thyroid  function studies No results for input(s): TSH, T4TOTAL, T3FREE, THYROIDAB in the last 72 hours.  Invalid input(s): FREET3 Anemia work up No results for input(s): VITAMINB12, FOLATE, FERRITIN, TIBC, IRON, RETICCTPCT in the last 72 hours. Urinalysis    Component Value Date/Time   COLORURINE YELLOW 01/14/2024 1018   APPEARANCEUR CLEAR 01/14/2024 1018   APPEARANCEUR Clear 11/24/2023 1336   LABSPEC 1.016 01/14/2024 1018   PHURINE 5.5 01/14/2024 1018   GLUCOSEU >1,000 (A) 01/14/2024 1018   HGBUR NEGATIVE 01/14/2024 1018   BILIRUBINUR NEGATIVE 01/14/2024 1018   BILIRUBINUR Negative 11/24/2023 1336   KETONESUR NEGATIVE 01/14/2024 1018   PROTEINUR TRACE (A) 01/14/2024 1018   UROBILINOGEN 0.2 10/22/2013 1127   NITRITE NEGATIVE 01/14/2024 1018   LEUKOCYTESUR MODERATE (A) 01/14/2024 1018   Sepsis Labs Recent Labs  Lab 01/16/24 0237 01/17/24 0240 01/18/24 0228 01/19/24 0316  WBC 8.2 6.5 7.1 6.1   Microbiology  Recent Results (from the past 240 hours)  Urine Culture     Status: Abnormal   Collection Time: 01/14/24 10:18 AM    Specimen: Urine, Clean Catch  Result Value Ref Range Status   Specimen Description   Final    URINE, CLEAN CATCH Performed at [facility], [address] [place], [place] [address]    Special Requests   Final    NONE Performed at [facility], [address], [place], [place] [address]    Culture (A)  Final    <10,000 COLONIES/mL INSIGNIFICANT GROWTH Performed at [facility], [address], [place], [place] [address]    Report Status 01/15/2024 FINAL  Final     Time coordinating discharge: 35 minutes  SIGNED:   Sophie Mao, MD  [facility] 01/19/2024, 10:34 AM

## 2024-01-19 NOTE — Progress Notes (Signed)
 Rounding Note   Patient Name: [name] [name] Date of Encounter: 01/19/2024  [facility] Cardiologist: Lynwood Schilling, MD   Subjective Breathing better.  Sats 92% or RA in bed.  Denies pain.    Scheduled Meds:  apixaban   10 mg Oral BID   Followed by   NOREEN ON 01/23/2024] apixaban   5 mg Oral BID   atorvastatin   20 mg Oral Daily   dapagliflozin  propanediol  10 mg Oral Daily   levothyroxine   50 mcg Oral QAC breakfast   metoprolol  succinate  150 mg Oral Daily   oxybutynin   10 mg Oral QHS   pantoprazole   40 mg Oral BID AC   polyethylene glycol  17 g Oral Daily   Continuous Infusions:   PRN Meds: acetaminophen  **OR** acetaminophen , fentaNYL  (SUBLIMAZE ) injection, HYDROcodone -acetaminophen , ondansetron  **OR** ondansetron  (ZOFRAN ) IV, mouth rinse, phenol   Vital Signs  Vitals:   01/18/24 2018 01/19/24 0021 01/19/24 0444 01/19/24 0737  BP: 96/63 106/67 122/75 118/76  Pulse: 91 81 72 88  Resp: 18 19 20 16   Temp: 97.7 F (36.5 C) 98 F (36.7 C) 97.8 F (36.6 C) 98.3 F (36.8 C)  TempSrc: Oral Axillary Oral Oral  SpO2: 98% 92% 94% 90%  Weight:   91.2 kg   Height:        Intake/Output Summary (Last 24 hours) at 01/19/2024 0842 Last data filed at 01/19/2024 0446 Gross per 24 hour  Intake 476 ml  Output 1300 ml  Net -824 ml      01/19/2024    4:44 AM 01/18/2024    1:11 AM 01/17/2024   12:47 AM  Last 3 Weights  Weight (lbs) 201 lb 1 oz 200 lb 202 lb 1.6 oz  Weight (kg) 91.2 kg 90.719 kg 91.672 kg      Telemetry Atrial fib with controlled ventricular rate  ECG  NA - Personally Reviewed  Physical Exam  GEN: No  acute distress.   Neck: No  JVD Cardiac: Irregular RR, no murmurs, rubs, or gallops.  Respiratory: Clear   to auscultation bilaterally. GI: Soft, nontender, non-distended, normal bowel sounds  MS:  No edema; No deformity. Neuro:   Nonfocal  Psych: Oriented and appropriate    Labs High Sensitivity Troponin:   Recent Labs  Lab  01/14/24 2108 01/14/24 2233  TROPONINIHS 9 10     Chemistry Recent Labs  Lab 01/12/24 1100 01/14/24 1116 01/14/24 2055 01/15/24 0311 01/16/24 0237 01/17/24 0240 01/18/24 0228 01/19/24 0316  NA 140   < >  --  139   < > 139 135 137  K 3.9   < >  --  4.0   < > 4.1 3.9 4.3  CL 110   < >  --  112*   < > 107 107 106  CO2 21*   < >  --  21*   < > 22 23 22   GLUCOSE 114*   < >  --  126*   < > 110* 134* 108*  BUN 40*   < >  --  22   < > 30* 44* 49*  CREATININE 1.24*   < >  --  1.07*   < > 1.36* 1.61* 1.43*  CALCIUM  11.0*   < >  --  10.4*   < > 11.3* 11.3* 11.7*  MG  --   --  2.1 2.2  --   --   --  2.3  PROT 6.3*  --   --  5.7*  --   --   --   --  ALBUMIN  3.6  --   --  3.2*  --   --   --   --   AST 13*  --   --  9*  --   --   --   --   ALT 12  --   --  10  --   --   --   --   ALKPHOS 98  --   --  84  --   --   --   --   BILITOT 0.4  --   --  0.7  --   --   --   --   GFRNONAA 43*   < >  --  52*   < > 39* 32* 37*  ANIONGAP 9   < >  --  6   < > 10 5 9    < > = values in this interval not displayed.    Lipids No results for input(s): CHOL, TRIG, HDL, LABVLDL, LDLCALC, CHOLHDL in the last 168 hours.  Hematology Recent Labs  Lab 01/17/24 0240 01/18/24 0228 01/19/24 0316  WBC 6.5 7.1 6.1  RBC 3.87 3.76* 3.59*  HGB 11.7* 11.5* 11.0*  HCT 36.8 35.3* 33.9*  MCV 95.1 93.9 94.4  MCH 30.2 30.6 30.6  MCHC 31.8 32.6 32.4  RDW 13.3 13.4 13.3  PLT 203 228 243   Thyroid   Recent Labs  Lab 01/14/24 2055  TSH 2.297    BNP Recent Labs  Lab 01/14/24 2108  BNP 458.3*    DDimer  Recent Labs  Lab 01/14/24 1116  DDIMER 6.73*     Radiology  No results found.   Cardiac Studies See echo above.   Patient Profile   83 y.o. female with a hx of HFimpEF, mitral valve disease, hypertension, DVT/PE 7/21, history of GI bleed, iron deficiency anemia, CKD stage III and history of persistent atrial fibrillation s/p Watchman device 06/15/2023 who is being seen 01/15/2024 for the  evaluation of afib with RVR at the request of Dr. Fairy.   Assessment & Plan  Persistent atrial fibrillation with RVR :    She will get an increased dose of Toprol  XL today.    Acute PE:   Recurrent event. Lifetime anticoag.     Currently on 10 mg bid Eliquis  x 14 doses then 5 bid. Will go home on home O2.    HFimpEF: EF 40 - 45%.    Net negative 2.4 liters.   Creat is mildly elevated but down today..  Stopped diuretic yesterday.    Likely home on Lasix  20 mg PRN swelling and Farxiga  daily.     I would not restart Cozaar  at this time and I will follow as an outpatient and consider this in the future.   History of GI bleed/iron deficiency anemia:  Hgb stable   Hypertension:   This is being managed in the context of treating his CHF   CKD stage III: Renal function stable  She has follow up scheduled with me in mid July.    For questions or updates, please contact [facility] Please consult [email or web address] for contact info under     Signed, Lynwood Schilling, MD  01/19/2024, 8:42 AM

## 2024-01-22 ENCOUNTER — Telehealth: Payer: Self-pay | Admitting: *Deleted

## 2024-01-22 NOTE — Transitions of Care (Post Inpatient/ED Visit) (Signed)
 01/22/2024  Name: [name] [name] MRN: [id number] DOB: [birth year]  Today's TOC FU Call Status: Today's TOC FU Call Status:: Successful TOC FU Call Completed TOC FU Call Complete Date: 01/19/24 Patient's Name and Date of Birth confirmed.  Transition Care Management Follow-up Telephone Call Date of Discharge: 01/19/24 Discharge Facility: [facility]) Type of Discharge: Inpatient Admission Primary Inpatient Discharge Diagnosis:: Acute pulmonary embolism How have you been since you were released from the hospital?:  (eating , drinking well, no issues with bowel/ bladder, using walker for ambulation, has good family support) Any questions or concerns?: No  Items Reviewed: Did you receive and understand the discharge instructions provided?: Yes Medications obtained,verified, and reconciled?: Yes (Medications Reviewed) Any new allergies since your discharge?: No Dietary orders reviewed?: Yes Type of Diet Ordered:: low sodium,  heart healthy Do you have support at home?: Yes People in Home [RPT]: child(ren), adult Name of Support/Comfort Primary Source: adult son lives with pt,   pt has other family members involved in her care Reviewed signs/ symptoms of pulmonary embolus, importance of seeking care immediately for any symptoms Reviewed signs/ symptoms bleeding and to report immediately Reviewed HF action plan, pt weighs daily and records Reviewed safety precautions Primary care provider office will call patient back with a post hospital follow up appointment for one week out, pt verbalizes understanding and will call primary care provider office or RN CM later today if she does not hear from anyone Provided RN CM contact information   Medications Reviewed Today: Medications Reviewed Today     Reviewed by Aura Mliss LABOR, RN (Registered Nurse) on 01/22/24 at 1305  Med List Status: <None>   Medication Order Taking? Sig Documenting Provider Last Dose Status Informant   acetaminophen  (TYLENOL ) 325 MG tablet [id number] Yes Take 2 tablets (650 mg total) by mouth every 6 (six) hours as needed for mild pain or headache (or Fever >/= 101). Pearlean Manus, MD  Active Self  allopurinol  (ZYLOPRIM ) 100 MG tablet [id number] Yes Take 1 tablet (100 mg total) by mouth daily. Joesph Annabella HERO, FNP  Active Self  apixaban  (ELIQUIS ) 5 MG TABS tablet [id number] Yes Take 2 tablets (10 mg total) by mouth 2 (two) times daily for 4 days, THEN 1 tablet (5 mg total) 2 (two) times daily. Cheryle Page, MD  Active   atorvastatin  (LIPITOR) 20 MG tablet [id number] Yes Take 1 tablet (20 mg total) by mouth daily. Joesph Annabella HERO, FNP  Active Self  cetirizine  (ZYRTEC ) 10 MG tablet [id number] Yes Take 10 mg by mouth daily. [provider]  Active Self  dapagliflozin  propanediol (FARXIGA ) 10 MG TABS tablet [id number] Yes Take 1 tablet (10 mg total) by mouth daily. Joesph Annabella HERO, FNP  Active Self  ferrous sulfate  325 (65 FE) MG EC tablet [id number] Yes Take 325 mg by mouth daily. [provider]  Active Self  furosemide  (LASIX ) 20 MG tablet [id number] Yes Take 1 tablet (20 mg total) by mouth daily as needed for fluid or edema. Cheryle Page, MD  Active   [facility] CRANBERRY EXTRACT PO [id number] Yes Take 1 tablet by mouth in the morning. [provider]  Active Self  levothyroxine  (SYNTHROID ) 50 MCG tablet [id number] Yes Take 1 tablet (50 mcg total) by mouth daily before breakfast. Joesph Annabella HERO, FNP  Active Self  metoprolol  succinate (TOPROL -XL) 50 MG 24 hr tablet [id number] Yes Take 3 tablets (150 mg total) by mouth daily. Cheryle Page, MD  Active   oxybutynin  (DITROPAN -XL) 10 MG 24  hr tablet [id number] Yes Take 1 tablet (10 mg total) by mouth at bedtime. Joesph Annabella HERO, FNP  Active Self  pantoprazole  (PROTONIX ) 40 MG tablet [id number] Yes Take 1 tablet (40 mg total) by mouth 2 (two) times daily before a meal. Joesph Annabella HERO, FNP  Active Self  polyethylene glycol  powder (GLYCOLAX /MIRALAX ) 17 GM/SCOOP powder [id number] Yes Take 1 capful (17 g) by mouth daily as needed for moderate constipation. Cheryle Page, MD  Active             Home Care and Equipment/Supplies: Were Home Health Services Ordered?: No Any new equipment or medical supplies ordered?: No  Functional Questionnaire: Do you need assistance with bathing/showering or dressing?: Yes (shower chair) Do you need assistance with meal preparation?: No Do you need assistance with eating?: No Do you have difficulty maintaining continence: No Do you need assistance with getting out of bed/getting out of a chair/moving?: Yes (walker) Do you have difficulty managing or taking your medications?: No  Follow up appointments reviewed: PCP Follow-up appointment confirmed?: No (Collaborated with care guide, there are no appointments before 7/16 (pt to see cardiologist on 7/16), care guide messaged practice to see if they can secure sooner appointment and they will call pt back, pt verbalizes understanding) MD Provider Line [phone] Given: No Specialist Hospital Follow-up appointment confirmed?: Yes Date of Specialist follow-up appointment?: 02/07/24 Follow-Up Specialty Provider:: Dr. Lavona- cardiology-  @ 1020 am Do you need transportation to your follow-up appointment?: No Do you understand care options if your condition(s) worsen?: Yes-patient verbalized understanding  SDOH Interventions Today    Flowsheet Row Most Recent Value  SDOH Interventions   Food Insecurity Interventions Intervention Not Indicated  Housing Interventions Intervention Not Indicated  Transportation Interventions Intervention Not Indicated  Utilities Interventions Intervention Not Indicated    [name], BSN RN Care Manager/ Transition of Care [place]/ [facility] [phone]

## 2024-01-24 ENCOUNTER — Encounter: Payer: Self-pay | Admitting: Family Medicine

## 2024-01-24 ENCOUNTER — Ambulatory Visit: Admitting: Family Medicine

## 2024-01-24 VITALS — BP 126/79 | HR 80 | Temp 98.4°F | Ht 63.0 in | Wt 199.6 lb

## 2024-01-24 DIAGNOSIS — R195 Other fecal abnormalities: Secondary | ICD-10-CM

## 2024-01-24 DIAGNOSIS — E1165 Type 2 diabetes mellitus with hyperglycemia: Secondary | ICD-10-CM | POA: Insufficient documentation

## 2024-01-24 DIAGNOSIS — N1831 Chronic kidney disease, stage 3a: Secondary | ICD-10-CM

## 2024-01-24 DIAGNOSIS — D500 Iron deficiency anemia secondary to blood loss (chronic): Secondary | ICD-10-CM

## 2024-01-24 DIAGNOSIS — Z95818 Presence of other cardiac implants and grafts: Secondary | ICD-10-CM | POA: Diagnosis not present

## 2024-01-24 DIAGNOSIS — J9601 Acute respiratory failure with hypoxia: Secondary | ICD-10-CM | POA: Diagnosis not present

## 2024-01-24 DIAGNOSIS — I2699 Other pulmonary embolism without acute cor pulmonale: Secondary | ICD-10-CM | POA: Diagnosis not present

## 2024-01-24 DIAGNOSIS — I480 Paroxysmal atrial fibrillation: Secondary | ICD-10-CM

## 2024-01-24 DIAGNOSIS — I5042 Chronic combined systolic (congestive) and diastolic (congestive) heart failure: Secondary | ICD-10-CM | POA: Diagnosis not present

## 2024-01-24 NOTE — Progress Notes (Signed)
 Established Patient Office Visit  Subjective   Patient ID: [name] [name], female    DOB: 27-Feb-1941  Age: 83 y.o. MRN: [id number]  Chief Complaint  Patient presents with   Transitions Of Care    HPI  Today's visit was for Transitional Care Management.  The patient was discharged from [facility] on 01/19/24 with a primary diagnosis of Acute pulmonary embolism.   Contact with the patient and/or caregiver, by a clinical staff member, was made on 01/22/24 and was documented as a telephone encounter within the EMR.  Through chart review and discussion with the patient I have determined that management of their condition is of high complexity.   Per hospital discharge summary:  Admit date: 01/14/2024 Discharge date: 01/19/2024   Admitted From: Home Disposition: Home   Recommendations for Outpatient Follow-up:  Follow up with PCP in 1 week with repeat CBC/BMP Outpatient follow-up with cardiology Follow up in ED if symptoms worsen or new appear     Home Health: No Equipment/Devices: None   Discharge Condition: Stable CODE STATUS: DNR Diet recommendation: Heart healthy/fluid restriction of up to 1200 cc a day   Brief/Interim Summary: 83 year old female with history of chronic systolic CHF with recovered EF, A-fib with watchman's device currently not on Eliquis  following her Watchman procedure, iron deficiency anemia, GAVEs' disease, history of PE, CKD IIIa, hypertension presented with worsening lower extremity swelling and right-sided chest pain.  She was found to be in A-fib with RVR, hypoxic requiring supplemental oxygen .  CTA chest showed bilateral pulmonary embolism of moderate volume, small bilateral pleural effusions.  She was started on IV heparin  and IV Lasix .  Cardiology was consulted.  During the hospitalization, her condition is improved.  She is currently on high dose of oral Toprol  XL with stable heart rate.  She required IV heparin  which has already been switched  to oral Eliquis .  She also was treated with IV Lasix  with good diuresis.  Subsequently, diuretics have been discontinued by cardiology.  Cardiology has cleared the patient for discharge home today.  She will be discharged home today with outpatient follow-up with cardiology.     Discharge Diagnoses:    Acute hypoxic respiratory failure Acute bilateral pulmonary embolism -After coming off anticoagulation in January 2025 - Completed 48 hours of IV heparin , switched to oral Eliquis  already -Ideally needs long-term anticoagulation now - 2D echo with EF 40-45%, grade 1 DD, mildly reduced RV, moderate MR -Respiratory status improving.  Currently on room air.   Persistent A-fib with RVR - History of Watchman device - Toprol  dose increased to 150 mg daily by cardiology.  Heart rate has stabilized. - Back on anticoagulation for PE/DVT - Cardiology has cleared the patient for discharge home today.    Acute on chronic combined CHF - Previously with recovered EF - Last echo 1/25 with EF 50-55%, repeat echo now with EF down to 40-45%, grade 1 DD, mildly reduced RV, moderate MR - She also was treated with IV Lasix  with good diuresis.  Subsequently, diuretics have been discontinued by cardiology.  Cardiology has cleared the patient for discharge home today.  She will be discharged home today with outpatient follow-up with cardiology. Continue Farxiga .  Cardiology recommends Lasix  20 mg daily as needed.     Mild AKI - Creatinine improving.  Outpatient follow-up.   Chronic iron deficiency anemia from GI blood loss GAVE's disease - On periodic iron infusions, hemoglobin relatively stable at this time   Type 2 diabetes mellitus - Continue  Farxiga .  Outpatient follow-up.  Carb modified diet   Obesity -Outpatient follow-up  She has been compliant with eliquis . She is having dark stools since discharge however. She continues to have some shortness of breath with exertion but this has been improving.  HR has been less than 100 generally. No chest pain, fever, dizziness, lightheadedness, palpitations. Edema has been improving. She has monitoring her weight and has not had any weight gain. She has an appt with cardiology for 02/07/24.   Past Medical History:  Diagnosis Date   Anemia    Arthritis    Ostearthritis- hips, knees, fingers   Atrial fibrillation (HCC)    Basal cell carcinoma (BCC) of right hand 10/2021   Dx by [facility]   Blood transfusion without reported diagnosis 2021   Chronic kidney disease    DVT (deep venous thrombosis) (HCC)    Dyspnea    Dysrhythmia    GERD (gastroesophageal reflux disease)    Headache(784.0)    tx. Valproic  acid   History of diabetes mellitus    History of kidney stones    Hypertension    Hypothyroidism    Presence of Watchman left atrial appendage closure device 06/15/2023   24mm Watchman FLX Pro device placed by Dr. Cindie   Pulmonary embolism (HCC)       ROS As per HPI.    Objective:     BP 126/79   Pulse 80   Temp 98.4 F (36.9 C) (Temporal)   Ht 5' 3 (1.6 m)   Wt 199 lb 9.6 oz (90.5 kg)   SpO2 97%   BMI 35.36 kg/m    Physical Exam Vitals and nursing note reviewed.  Constitutional:      General: She is not in acute distress.    Appearance: She is obese. She is not ill-appearing, toxic-appearing or diaphoretic.  Neck:     Vascular: No carotid bruit.  Cardiovascular:     Rate and Rhythm: Normal rate. Rhythm irregularly irregular.     Heart sounds: Normal heart sounds. No murmur heard. Pulmonary:     Effort: Pulmonary effort is normal. No respiratory distress.     Breath sounds: Normal breath sounds. No wheezing or rhonchi.  Abdominal:     General: Bowel sounds are normal. There is no distension.     Palpations: Abdomen is soft.     Tenderness: There is no abdominal tenderness. There is no right CVA tenderness, left CVA tenderness, guarding or rebound.  Musculoskeletal:     Cervical back: Neck supple. No  rigidity.     Right lower leg: No edema.     Left lower leg: No edema.  Skin:    General: Skin is warm and dry.  Neurological:     General: No focal deficit present.     Mental Status: She is alert and oriented to person, place, and time.     Gait: Gait abnormal (using rolling walker).  Psychiatric:        Mood and Affect: Mood normal.        Behavior: Behavior normal.        Thought Content: Thought content normal.        Judgment: Judgment normal.      No results found for any visits on 01/24/24.    The ASCVD Risk score (Arnett DK, et al., 2019) failed to calculate for the following reasons:   The 2019 ASCVD risk score is only valid for ages 42 to 51    Assessment &  Plan:   Eris was seen today for transitions of care.  Diagnoses and all orders for this visit:  Acute respiratory failure with hypoxia (HCC) Resolved.  -     CBC with Differential/Platelet -     BMP8+EGFR  Other acute pulmonary embolism without acute cor pulmonale (HCC) Compliant with eliquis . Shortness of breath has been improving.  -     CBC with Differential/Platelet -     BMP8+EGFR  Paroxysmal atrial fibrillation with RVR (HCC) Presence of Watchman left atrial appendage closure device Normal rate today. Continue metoprolol . Denies symptoms.   Chronic combined systolic and diastolic CHF (congestive heart failure) (HCC) Euvolemic on exam.   Stage 3a chronic kidney disease (HCC) Labs pending. On farxiga .  -     CBC with Differential/Platelet -     BMP8+EGFR  Type 2 diabetes mellitus with hyperglycemia, without long-term current use of insulin  (HCC) Last A1c 4.8. On farxiga .   Iron deficiency anemia due to chronic blood loss CBC pending. Established with hematology.  -     CBC with Differential/Platelet  Dark stools ? GI bleed since restarting eliqus. FOBT pending. Established with GI and she will notify them.  -     Fecal occult blood, imunochemical; Future  Will determine follow up  pending labs.   The patient indicates understanding of these issues and agrees with the plan.   Annabella CHRISTELLA Search, FNP

## 2024-01-25 ENCOUNTER — Ambulatory Visit: Payer: Self-pay | Admitting: Family Medicine

## 2024-01-25 LAB — CBC WITH DIFFERENTIAL/PLATELET
Basophils Absolute: 0 10*3/uL (ref 0.0–0.2)
Basos: 1 %
EOS (ABSOLUTE): 0.1 10*3/uL (ref 0.0–0.4)
Eos: 2 %
Hematocrit: 36 % (ref 34.0–46.6)
Hemoglobin: 11.3 g/dL (ref 11.1–15.9)
Immature Grans (Abs): 0.1 10*3/uL (ref 0.0–0.1)
Immature Granulocytes: 1 %
Lymphocytes Absolute: 1.3 10*3/uL (ref 0.7–3.1)
Lymphs: 23 %
MCH: 30.5 pg (ref 26.6–33.0)
MCHC: 31.4 g/dL — ABNORMAL LOW (ref 31.5–35.7)
MCV: 97 fL (ref 79–97)
Monocytes Absolute: 0.8 10*3/uL (ref 0.1–0.9)
Monocytes: 15 %
Neutrophils Absolute: 3.3 10*3/uL (ref 1.4–7.0)
Neutrophils: 58 %
Platelets: 334 10*3/uL (ref 150–450)
RBC: 3.71 x10E6/uL — ABNORMAL LOW (ref 3.77–5.28)
RDW: 13.1 % (ref 11.7–15.4)
WBC: 5.7 10*3/uL (ref 3.4–10.8)

## 2024-01-25 LAB — BMP8+EGFR
BUN/Creatinine Ratio: 25 (ref 12–28)
BUN: 38 mg/dL — ABNORMAL HIGH (ref 8–27)
CO2: 20 mmol/L (ref 20–29)
Calcium: 11.8 mg/dL — ABNORMAL HIGH (ref 8.7–10.3)
Chloride: 107 mmol/L — ABNORMAL HIGH (ref 96–106)
Creatinine, Ser: 1.5 mg/dL — ABNORMAL HIGH (ref 0.57–1.00)
Glucose: 109 mg/dL — ABNORMAL HIGH (ref 70–99)
Potassium: 5.3 mmol/L — ABNORMAL HIGH (ref 3.5–5.2)
Sodium: 141 mmol/L (ref 134–144)
eGFR: 35 mL/min/{1.73_m2} — ABNORMAL LOW (ref >59)

## 2024-01-29 ENCOUNTER — Other Ambulatory Visit

## 2024-01-29 DIAGNOSIS — R195 Other fecal abnormalities: Secondary | ICD-10-CM

## 2024-01-30 LAB — FECAL OCCULT BLOOD, IMMUNOCHEMICAL: Fecal Occult Bld: NEGATIVE

## 2024-02-04 DIAGNOSIS — N1832 Chronic kidney disease, stage 3b: Secondary | ICD-10-CM | POA: Insufficient documentation

## 2024-02-04 NOTE — Progress Notes (Unsigned)
 Cardiology Office Note:   Date:  02/07/2024  ID:  [name] [name], [name] [name] February 10, 1941, MRN [id number] PCP: Joesph Annabella HERO, FNP  [facility] Providers Cardiologist:  Lynwood Schilling, MD Cardiology APP:  Jerilynn Lamarr HERO, NP {  History of Present Illness:   [name] [name] is a 83 y.o. female   who presents for management of chronic systolic CHF and persistent atrial fibrillation with CHADS  VASC Score of 6.   Echocardiogram on 02/07/21 revealed 35% to 40%, moderate elevated PASP with estimated RVSP 51.5 mmHg, severely dilated LA, moderate dilated RA, severe MR moderate TR, mild aortic sclerosis.  She did have a repeat echocardiogram which was completed on 04/01/2021.  She was found to have a normal ejection fraction of 55 to 60% with normal LV function.  It was however found that she had severe mitral valve regurgitation with no evidence of mitral valve stenosis, moderate mitral annular calcification.  PI SA ER O0 0.44 cm, MR radius 1.1 cm and MR regurgitant volume of 92 cc.  She was recommended for TEE for further assessment of mitral regurgitation.   She had this in Dec 2022 and her MR was thought to be moderate and this was reviewed by the structural team and intervention was not suggested.  The most recent echocardiogram in September of last year demonstrated mild mitral regurgitation.   She was in the hospital in June with acute respiratory failure and PE.  I managed her at that time.  She had rapid A-fib related to some volume overload or respiratory failure and pulmonary emboli.  She was diuresed.  She had her rate controlled.  She was started on anticoagulation.  She has done better since then.  She walks with a walker which has been chronic.  She did not need oxygen  at discharge.  I had started Farxiga  and she is doing well with this.  She has been sent home on as needed Lasix  but she has not needed this.  I held off on spironolactone because of some renal insufficiency.  She has  had her blood work checked and her creatinine was 1.5.  She was not anemic.  She had stool guaiac given a past history of back and she was negative.  She overall thinks she has done pretty well.  She says her heart rates in the 60s typically in fibrillation.  She follows this.  ROS: As stated in the HPI and negative for all other systems.  Studies Reviewed:    EKG:     NA  Risk Assessment/Calculations:    CHA2DS2-VASc Score = 4   This indicates a 4.8% annual risk of stroke. The patient's score is based upon: CHF History: 0 HTN History: 1 Diabetes History: 0 Stroke History: 0 Vascular Disease History: 0 Age Score: 2 Gender Score: 1    Physical Exam:   VS:  BP 118/64   Pulse 88   Ht 5' 3 (1.6 m)   Wt 200 lb (90.7 kg)   BMI 35.43 kg/m    Wt Readings from Last 3 Encounters:  02/07/24 200 lb (90.7 kg)  01/24/24 199 lb 9.6 oz (90.5 kg)  01/22/24 200 lb (90.7 kg)     GEN: Well nourished, well developed in no acute distress NECK: No JVD; No carotid bruits CARDIAC: Irregular RR, no murmurs, rubs, gallops RESPIRATORY:  Clear to auscultation without rales, wheezing or rhonchi  ABDOMEN: Soft, non-tender, non-distended EXTREMITIES:  No edema; No deformity   ASSESSMENT AND PLAN:  Persistent atrial fibrillation with RVR :    Her rate is controlled.  Tolerates anticoagulation.  She is right on the cusp of needing a lower dose but given the acute PE and gena keep her on the higher dose for now and follow-up would be made in about a month.  Respiratory failure: She has recovered from this.  No change in therapy.   Acute PE:   She has had recurrent PE and needs lifetime PE.  As above.  HFimpEF: EF 40 - 45%.   She seems to be euvolemic.  I will continue meds as listed.  I will hold off on ARB given the renal insufficiency.  Blood pressure is a little bit marginal.   N  History of GI bleed/iron deficiency anemia:   This is improved.  Hypertension:   This is being managed in the  context of treating his CHF   CKD stage III: As above this has been followed and I will check to be met in 1 month.  Follow up with me in 4 months.   Signed, Lynwood Schilling, MD

## 2024-02-06 ENCOUNTER — Inpatient Hospital Stay: Attending: Physician Assistant | Admitting: Oncology

## 2024-02-06 VITALS — BP 118/66 | HR 101 | Temp 98.6°F | Resp 18

## 2024-02-06 DIAGNOSIS — I2699 Other pulmonary embolism without acute cor pulmonale: Secondary | ICD-10-CM

## 2024-02-06 DIAGNOSIS — D649 Anemia, unspecified: Secondary | ICD-10-CM

## 2024-02-06 DIAGNOSIS — D500 Iron deficiency anemia secondary to blood loss (chronic): Secondary | ICD-10-CM | POA: Diagnosis not present

## 2024-02-06 NOTE — Progress Notes (Unsigned)
 [facility] [address], [place] [address]   CLINIC:  Medical Oncology/Hematology  PCP:  Joesph Annabella HERO, FNP [address] [place] [address] [phone]   REASON FOR VISIT:  Follow-up for iron deficiency anemia   PRIOR THERAPY: Blood transfusions and oral iron tablets   CURRENT THERAPY: Intermittent IV iron  INTERVAL HISTORY:   [name] [name] 83 y.o. female returns for routine follow-up of her iron deficiency anemia.  She was last seen by me on 10/06/2023.   She was admitted from 01/14/2024 through 01/19/2024 for A-fib with RVR and hypoxia.  CT chest showed bilateral PE and small bilateral pleural effusions.  She was taken off of Eliquis  because she had recently had the Watchman device placed on 06/15/2023.  She was on Plavix .  She required IV heparin  which was switched to oral Eliquis  at discharge.  She received 3 doses of IV on 10/12/2023, 10/20/2023 and 10/27/2018. She continues to deny any noticeable bleeding per rectum, melena or hematochezia.  Reports history of slow GI bleed which is why she was taken off Eliquis  to begin with.  She is taking iron 325 mg supplements daily without any GI upset.  Reports her PCP took her off her vitamin D  supplements which she had been on for years.    Had EGD on 02/01/2023 following a significant drop in her hemoglobin which showed a small hiatal hernia, gastric antral vascular ectasia without bleeding which was treated with ACP and single duodenal polyp that was resected.  She has some shortness of breath still from hospitalization.  Has occasional constipation and headache at times.  Reports a few episodes of darker stools.  PCP recommended stool occult which was negative for blood.  Reports her energy levels are low.  Denies any pain.  Appetite is 70%.  She is here today with her son.   ASSESSMENT & PLAN:  1.  Iron deficiency anemia secondary to GI blood loss - Normocytic anemia secondary to chronic blood loss, may also  have some anemia related to CKD stage IIIb - Hematology work-up (02/24/2022):  Ferritin 11, iron saturation 7%.  Normal folate, B12, MMA, copper . Reticulocytes 3.9% (consistent with acute blood loss), LDH normal, haptoglobin normal, DAT negative SPEP negative for monoclonal protein, but showed elevation in acute phase proteins - She has required intermittent blood transfusions, on 02/24/2022 (Hgb 6.7) and again on 03/03/2022 (Hgb 6.6).  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  Normal duodenum and jejunum. - Colonoscopy (12/08/2019): Diverticulosis and polyps -Had EGD on 02/01/23 which showed small hiatal hernia, gastric antral vascular ectasia without bleeding that was treated with argon plasma coagulation (ACP) and a single duodenal polyp that was resected and retrieved - She denies any overt melena - She is on Plavix .  - Most recent IV Feraheme  on 07/06/23 and 07/13/23. -She received a blood transfusion in ED on 01/27/23 for hemoglobin of 6.7.   2.   Unprovoked right leg DVT and PE: - Doppler on 04/03/2019: DVT in the femoral vein and calf veins. - CT chest PE protocol (04/03/2019): Moderate size pulmonary emboli in the right lower lobe. - She is on Plavix  daily, also for Afib   3.  Social/family history: - She lives at home with her older son.  She takes care of the garden and does laundry and her ADLs and IADLs. She was a retired tobacco former.  Never smoked. - Sister had anemia.  Mother had uterine cancer and  sister had colon cancer.   Plan: Iron deficiency anemia secondary to GI bleed: -Labs from 01/24/2024 show hemoglobin of 11.3, ferritin 22 and iron saturation 8%.  TIBC is normal at 336.   - We discussed additional IV iron.  Recommend 2-3 doses of IV Feraheme  given over the next few weeks. -Continue oral iron supplement as well. -We did discuss that unfortunately sometimes as we get older we do not absorb iron as well.  Recommend she take her PPI at least 6  hours from her iron supplement.  Take iron with orange juice or vitamin C supplement. -Recommend returning to clinic in approximately 3 months for follow-up and labs a few days before. -Goal hemoglobin is above 12 with a ferritin between 50 and 100.  Iron saturations between 20 and 30%. -She did have some small areas after her most recent EGD that were treated with ACP.  She has not noticed any additional bleeding recommend she follow-up with gastroenterology given she is now back on blood thinners.  2.  Atrial fibrillation: - Had Watchman device placed on 06/15/2023 with good tolerance.  She was taken off of Eliquis  and placed on Plavix  but given recent blood clots but she was put back on Eliquis  which she will likely remain on indefinitely.  She had a follow-up TEE on 08/18/2023 which showed A-fib heart rate 110 and she was started on Toprol  XL 12.5 mg which has since been increased to 25 mg with good tolerance. -Recently diagnosed with bilateral PEs and was restarted on Eliquis  5 mg twice daily.  Cardiology has recommended she is on anticoagulation indefinitely. -Continue follow-up with cardiology.  3.  Vitamin D  deficiency: -She is currently not taking supplements. -Last vitamin D  level was checked on 04/20/2023 which was normal at 34.2.  We will recheck at next visit.  4.  Pulmonary embolus/DVT: -Recently diagnosed and was placed back on Eliquis  5 mg twice daily. -Will discuss with Dr. Mardell if hypercoagulable workup is necessary.   PLAN SUMMARY: >> IV Feraheme  x 2-3 >> Labs in 3 months = CBC/D, ferritin, iron/TIBC, BMP >> RTC 1 week later for review of labs and assessment.     REVIEW OF SYSTEMS:   Review of Systems  Constitutional:  Positive for fatigue.  Respiratory:  Positive for shortness of breath.   Cardiovascular:  Positive for palpitations.  Gastrointestinal:  Negative for diarrhea and nausea.  Neurological:  Positive for headaches.     PHYSICAL EXAM:  ECOG PERFORMANCE  STATUS: 2 - Symptomatic, <50% confined to bed  Vitals:   02/06/24 1045  BP: 118/66  Pulse: (!) 101  Resp: 18  Temp: 98.6 F (37 C)  SpO2: 100%   There were no vitals filed for this visit.  Physical Exam Constitutional:      Appearance: Normal appearance.  Cardiovascular:     Rate and Rhythm: Normal rate and regular rhythm.  Pulmonary:     Effort: Pulmonary effort is normal.     Breath sounds: Normal breath sounds.  Abdominal:     General: Bowel sounds are normal.     Palpations: Abdomen is soft.  Musculoskeletal:        General: No swelling. Normal range of motion.  Neurological:     Mental Status: She is alert and oriented to person, place, and time. Mental status is at baseline.     PAST MEDICAL/SURGICAL HISTORY:  Past Medical History:  Diagnosis Date   Anemia    Arthritis    Ostearthritis- hips, knees,  fingers   Atrial fibrillation (HCC)    Basal cell carcinoma (BCC) of right hand 10/2021   Dx by [facility]   Blood transfusion without reported diagnosis 2021   Chronic kidney disease    DVT (deep venous thrombosis) (HCC)    Dyspnea    Dysrhythmia    GERD (gastroesophageal reflux disease)    Headache(784.0)    tx. Valproic  acid   History of diabetes mellitus    History of kidney stones    Hypertension    Hypothyroidism    Presence of Watchman left atrial appendage closure device 06/15/2023   24mm Watchman FLX Pro device placed by Dr. Cindie   Pulmonary embolism [facility])    Past Surgical History:  Procedure Laterality Date   ABDOMINAL HYSTERECTOMY     BILATERAL HIP ARTHROSCOPY Left    BIOPSY  12/08/2019   Procedure: BIOPSY;  Surgeon: Shaaron Lamar HERO, MD;  [facility]: [facility];  Service: Endoscopy;;   CATARACT EXTRACTION, BILATERAL     COLONOSCOPY N/A 12/08/2019   polyps (tubular adenoma), diverticulosis, colonic lipoma, no surveillance due to age   CYSTOSCOPY W/ URETERAL STENT PLACEMENT Right 12/25/2020   Procedure: CYSTOSCOPY WITH RETROGRADE  PYELOGRAM/URETERAL STENT PLACEMENT;  Surgeon: Selma Donnice SAUNDERS, MD;  Location: [facility];  Service: Urology;  Laterality: Right;   CYSTOSCOPY/URETEROSCOPY/HOLMIUM LASER/STENT PLACEMENT Right 01/15/2021   Procedure: CYSTOSCOPY/RETROGRADE/URETEROSCOPY/HOLMIUM LASER/STENT EXCHANGE;  Surgeon: Selma Donnice SAUNDERS, MD;  Location: [facility];  Service: Urology;  Laterality: Right;   ESOPHAGOGASTRODUODENOSCOPY N/A 12/08/2019   normal esophagus with 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   ESOPHAGOGASTRODUODENOSCOPY (EGD) WITH PROPOFOL  N/A 05/18/2021   Surgeon: Magod, Marc, MD;   Tiny hiatal hernia, gastric antral vascular ectasia s/p ablation, normal duodenum, normal jejunum.   ESOPHAGOGASTRODUODENOSCOPY (EGD) WITH PROPOFOL  N/A 03/10/2022   Procedure: ESOPHAGOGASTRODUODENOSCOPY (EGD) WITH PROPOFOL ;  Surgeon: Cindie Carlin POUR, DO;  [facility]: [facility];  Service: Endoscopy;  Laterality: N/A;  10:45am   ESOPHAGOGASTRODUODENOSCOPY (EGD) WITH PROPOFOL  N/A 02/01/2023   Procedure: ESOPHAGOGASTRODUODENOSCOPY (EGD) WITH PROPOFOL ;  Surgeon: Cindie Carlin POUR, DO;  [facility];  Service: Endoscopy;  Laterality: N/A;  9:00 am, asa 3   EYE SURGERY  2015   Cataracts   GIVENS CAPSULE STUDY N/A 01/13/2020   Procedure: GIVENS CAPSULE STUDY;  Surgeon: Shaaron Lamar HERO, MD;  [facility];  Service: Endoscopy;  Laterality: N/A;  7:30am   GIVENS CAPSULE STUDY N/A 05/04/2021   Procedure: GIVENS CAPSULE STUDY;  Surgeon: Rosalie Kitchens, MD;  [facility]: [facility];  Service: Endoscopy;  Laterality: N/A;   JOINT REPLACEMENT  2012 2015   Hips   LEFT ATRIAL APPENDAGE OCCLUSION N/A 06/15/2023   Procedure: LEFT ATRIAL APPENDAGE OCCLUSION;  Surgeon: Cindie Ole DASEN, MD;  [facility]: [facility];  Service: Cardiovascular;  Laterality: N/A;   MULTIPLE TOOTH EXTRACTIONS     60's   PARATHYROIDECTOMY     POLYPECTOMY  12/08/2019   Procedure: POLYPECTOMY;  Surgeon: Shaaron Lamar HERO,  MD;  [facility];  Service: Endoscopy;;   POLYPECTOMY  02/01/2023   Procedure: POLYPECTOMY;  Surgeon: Cindie Carlin POUR, DO;  [facility];  Service: Endoscopy;;   RADIOFREQUENCY ABLATION  05/18/2021   Procedure: RADIO FREQUENCY ABLATION;  Surgeon: Rosalie Kitchens, MD;  [facility];  Service: Endoscopy;;   TEE WITHOUT CARDIOVERSION N/A 07/06/2021   Procedure: TRANSESOPHAGEAL ECHOCARDIOGRAM (TEE);  Surgeon: Kate Lonni CROME, MD;  Location: [facility];  Service: Cardiovascular;  Laterality: N/A;  TEE WITHOUT CARDIOVERSION N/A 06/15/2023   Procedure: TRANSESOPHAGEAL ECHOCARDIOGRAM;  Surgeon: Cindie Ole DASEN, MD;  Location: [facility] INVASIVE CV LAB;  Service: Cardiovascular;  Laterality: N/A;   TOTAL HIP ARTHROPLASTY Right 10/29/2013   Procedure: RIGHT TOTAL HIP ARTHROPLASTY ANTERIOR APPROACH;  Surgeon: Donnice JONETTA Car, MD;  Location: [facility];  Service: Orthopedics;  Laterality: Right;   TRANSESOPHAGEAL ECHOCARDIOGRAM (CATH LAB) N/A 08/18/2023   Procedure: TRANSESOPHAGEAL ECHOCARDIOGRAM;  Surgeon: Francyne Headland, MD;  [facility]: [facility];  Service: Cardiovascular;  Laterality: N/A;   TUBAL LIGATION  1972    SOCIAL HISTORY:  Social History   Socioeconomic History   Marital status: Widowed    Spouse name: Not on file   Number of children: 4   Years of education: 13   Highest education level: 12th grade  Occupational History   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 She has four adult children and one son lives with her.       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   Social Drivers of  Corporate investment banker Strain: Low Risk  (01/23/2024)   Overall Financial Resource Strain (CARDIA)    Difficulty of Paying Living Expenses: Not very hard  Food Insecurity: No Food Insecurity (01/23/2024)   Hunger Vital Sign    Worried About Running Out of Food in the Last Year: Never true    Ran Out of Food in the Last Year: Never true  Transportation Needs: No Transportation Needs (01/23/2024)   PRAPARE - Administrator, Civil Service (Medical): No    Lack of Transportation (Non-Medical): No  Physical Activity: Insufficiently Active (01/23/2024)   Exercise Vital Sign    Days of Exercise per Week: 1 day    Minutes of Exercise per Session: 10 min  Stress: No Stress Concern Present (11/23/2023)   [name] [name] of Occupational Health - Occupational Stress Questionnaire    Feeling of Stress : Not at all  Social Connections: Moderately Isolated (01/23/2024)   Social Connection and Isolation Panel    Frequency of Communication with Friends and Family: More than three times a week    Frequency of Social Gatherings with Friends and Family: Twice a week    Attends Religious Services: More than 4 times per year    Active Member of [employer] or Organizations: No    Attends Banker Meetings: Not on file    Marital Status: Widowed  Intimate Partner Violence: Not At Risk (01/22/2024)   Humiliation, Afraid, Rape, and Kick questionnaire    Fear of Current or Ex-Partner: No    Emotionally Abused: No    Physically Abused: No    Sexually Abused: No    FAMILY HISTORY:  Family History  Problem Relation Age of Onset   Colitis Sister 88       alive   Cancer Sister 107       colon   Cancer Mother 110       uterine   Heart disease Father 4  heart failure   Heart attack Brother 80   Healthy Daughter    Healthy Son    Pulmonary embolism Brother 36   Heart disease Brother    Healthy Son    Healthy Son     CURRENT MEDICATIONS:  Outpatient Encounter Medications as of  02/06/2024  Medication Sig   acetaminophen  (TYLENOL ) 325 MG tablet Take 2 tablets (650 mg total) by mouth every 6 (six) hours as needed for mild pain or headache (or Fever >/= 101).   allopurinol  (ZYLOPRIM ) 100 MG tablet Take 1 tablet (100 mg total) by mouth daily.   apixaban  (ELIQUIS ) 5 MG TABS tablet Take 2 tablets (10 mg total) by mouth 2 (two) times daily for 4 days, THEN 1 tablet (5 mg total) 2 (two) times daily.   atorvastatin  (LIPITOR) 20 MG tablet Take 1 tablet (20 mg total) by mouth daily.   cetirizine  (ZYRTEC ) 10 MG tablet Take 10 mg by mouth daily.   dapagliflozin  propanediol (FARXIGA ) 10 MG TABS tablet Take 1 tablet (10 mg total) by mouth daily.   ferrous sulfate  325 (65 FE) MG EC tablet Take 325 mg by mouth daily.   furosemide  (LASIX ) 20 MG tablet Take 1 tablet (20 mg total) by mouth daily as needed for fluid or edema.   GNP CRANBERRY EXTRACT PO Take 1 tablet by mouth in the morning.   levothyroxine  (SYNTHROID ) 50 MCG tablet Take 1 tablet (50 mcg total) by mouth daily before breakfast.   metoprolol  succinate (TOPROL -XL) 50 MG 24 hr tablet Take 3 tablets (150 mg total) by mouth daily.   oxybutynin  (DITROPAN -XL) 10 MG 24 hr tablet Take 1 tablet (10 mg total) by mouth at bedtime.   pantoprazole  (PROTONIX ) 40 MG tablet Take 1 tablet (40 mg total) by mouth 2 (two) times daily before a meal.   polyethylene glycol powder (GLYCOLAX /MIRALAX ) 17 GM/SCOOP powder Take 1 capful (17 g) by mouth daily as needed for moderate constipation.   No facility-administered encounter medications on file as of 02/06/2024.    ALLERGIES:  No Known Allergies  LABORATORY DATA:  I have reviewed the labs as listed.  CBC    Component Value Date/Time   WBC 5.7 01/24/2024 1218   WBC 6.1 01/19/2024 0316   RBC 3.71 (L) 01/24/2024 1218   RBC 3.59 (L) 01/19/2024 0316   HGB 11.3 01/24/2024 1218   HCT 36.0 01/24/2024 1218   PLT 334 01/24/2024 1218   MCV 97 01/24/2024 1218   MCH 30.5 01/24/2024 1218   MCH 30.6  01/19/2024 0316   MCHC 31.4 (L) 01/24/2024 1218   MCHC 32.4 01/19/2024 0316   RDW 13.1 01/24/2024 1218   LYMPHSABS 1.3 01/24/2024 1218   MONOABS 0.8 01/12/2024 1100   EOSABS 0.1 01/24/2024 1218   BASOSABS 0.0 01/24/2024 1218      Latest Ref Rng & Units 01/24/2024   12:18 PM 01/19/2024    3:16 AM 01/18/2024    2:28 AM  CMP  Glucose 70 - 99 mg/dL 890  891  865   BUN 8 - 27 mg/dL 38  49  44   Creatinine 0.57 - 1.00 mg/dL 8.49  8.56  8.38   Sodium 134 - 144 mmol/L 141  137  135   Potassium 3.5 - 5.2 mmol/L 5.3  4.3  3.9   Chloride 96 - 106 mmol/L 107  106  107   CO2 20 - 29 mmol/L 20  22  23    Calcium  8.7 - 10.3 mg/dL 88.1  11.7  11.3     DIAGNOSTIC IMAGING:  I have independently reviewed the relevant imaging and discussed with the patient.   WRAP UP:  All questions were answered. The patient knows to call the clinic with any problems, questions or concerns.  Medical decision making: Moderate  Time spent on visit: I spent 20 minutes dedicated to the care of this patient (face-to-face and non-face-to-face) on the date of the encounter to include what is described in the assessment and plan.  Delon Hope, NP 02/08/2024 7:43 AM

## 2024-02-07 ENCOUNTER — Ambulatory Visit: Admitting: Cardiology

## 2024-02-07 ENCOUNTER — Encounter: Payer: Self-pay | Admitting: Cardiology

## 2024-02-07 VITALS — BP 118/64 | HR 88 | Ht 63.0 in | Wt 200.0 lb

## 2024-02-07 DIAGNOSIS — I420 Dilated cardiomyopathy: Secondary | ICD-10-CM | POA: Diagnosis not present

## 2024-02-07 DIAGNOSIS — I4891 Unspecified atrial fibrillation: Secondary | ICD-10-CM | POA: Diagnosis not present

## 2024-02-07 DIAGNOSIS — N1832 Chronic kidney disease, stage 3b: Secondary | ICD-10-CM | POA: Diagnosis not present

## 2024-02-07 DIAGNOSIS — I10 Essential (primary) hypertension: Secondary | ICD-10-CM | POA: Diagnosis not present

## 2024-02-07 DIAGNOSIS — I5032 Chronic diastolic (congestive) heart failure: Secondary | ICD-10-CM | POA: Diagnosis not present

## 2024-02-07 NOTE — Patient Instructions (Signed)
 Medication Instructions:  Your physician recommends that you continue on your current medications as directed. Please refer to the Current Medication list given to you today.   Labwork: BMET in 1 month   (Mid August)  Testing/Procedures: None today  Follow-Up: 4 months with Dr.Hochrein  Any Other Special Instructions Will Be Listed Below (If Applicable).  If you need a refill on your cardiac medications before your next appointment, please call your pharmacy.

## 2024-02-08 ENCOUNTER — Encounter: Payer: Self-pay | Admitting: Hematology

## 2024-02-09 ENCOUNTER — Inpatient Hospital Stay

## 2024-02-09 VITALS — BP 129/68 | HR 87 | Temp 97.8°F | Resp 18

## 2024-02-09 DIAGNOSIS — D500 Iron deficiency anemia secondary to blood loss (chronic): Secondary | ICD-10-CM

## 2024-02-09 MED ORDER — SODIUM CHLORIDE 0.9 % IV SOLN
510.0000 mg | Freq: Once | INTRAVENOUS | Status: AC
  Administered 2024-02-09: 510 mg via INTRAVENOUS
  Filled 2024-02-09: qty 510

## 2024-02-09 MED ORDER — SODIUM CHLORIDE 0.9 % IV SOLN: Freq: Once | INTRAVENOUS | Status: AC

## 2024-02-09 NOTE — Progress Notes (Signed)
 Patient presents today for Feraheme , patient reports taking pre-medications at home. Patient tolerated iron infusion with no complaints voiced. Peripheral IV site clean and dry with good blood return noted before and after infusion. Patient refused to wait 30 minute wait time. Band aid applied. VSS with discharge and left in satisfactory condition with no s/s of distress noted.

## 2024-02-09 NOTE — Patient Instructions (Signed)

## 2024-02-14 ENCOUNTER — Other Ambulatory Visit

## 2024-02-14 ENCOUNTER — Ambulatory Visit: Payer: Self-pay

## 2024-02-14 ENCOUNTER — Other Ambulatory Visit: Payer: Self-pay | Admitting: *Deleted

## 2024-02-14 DIAGNOSIS — N3000 Acute cystitis without hematuria: Secondary | ICD-10-CM

## 2024-02-14 DIAGNOSIS — R300 Dysuria: Secondary | ICD-10-CM | POA: Diagnosis not present

## 2024-02-14 LAB — URINALYSIS, ROUTINE W REFLEX MICROSCOPIC
Bilirubin, UA: NEGATIVE
Ketones, UA: NEGATIVE
Nitrite, UA: POSITIVE — AB
Specific Gravity, UA: <1.005 — ABNORMAL LOW (ref 1.005–1.030)
Urobilinogen, Ur: 0.2 mg/dL (ref 0.2–1.0)
pH, UA: 5.5 (ref 5.0–7.5)

## 2024-02-14 LAB — MICROSCOPIC EXAMINATION
Epithelial Cells (non renal): NONE SEEN /HPF (ref 0–10)
Renal Epithel, UA: NONE SEEN /HPF
WBC, UA: >30 /HPF — AB (ref 0–5)

## 2024-02-14 MED ORDER — APIXABAN 5 MG PO TABS: 5.0000 mg | ORAL_TABLET | Freq: Two times a day (BID) | ORAL | 3 refills | Status: DC

## 2024-02-14 MED ORDER — CEPHALEXIN 500 MG PO CAPS: 500.0000 mg | ORAL_CAPSULE | Freq: Two times a day (BID) | ORAL | 0 refills | Status: AC

## 2024-02-14 MED ORDER — APIXABAN 5 MG PO TABS
5.0000 mg | ORAL_TABLET | Freq: Two times a day (BID) | ORAL | 3 refills | Status: DC
Start: 2024-02-14 — End: 2024-02-14

## 2024-02-14 MED ORDER — CEPHALEXIN 500 MG PO CAPS: 500.0000 mg | ORAL_CAPSULE | Freq: Two times a day (BID) | ORAL | 0 refills | Status: DC

## 2024-02-14 NOTE — Addendum Note (Signed)
 Addended by: RANDINE ARNULFO MATSU on: 02/14/2024 03:29 PM   Modules accepted: Orders

## 2024-02-14 NOTE — Telephone Encounter (Signed)
 Pt aware Kelfex sent to pharmacy for UTI and pt requested Tiffany send in refill of her Eliquis  so se doesn't have to drive to [facility] to get medication. Tiffany ok'd refill so sent into pharmacy as requested.

## 2024-02-14 NOTE — Telephone Encounter (Signed)
 Pt is having some dysuria and per Tiffany ok this time to just bring a urine to office for us  to check. Pt will see if someone can drop one off and put I put future order in.

## 2024-02-14 NOTE — Telephone Encounter (Signed)
 UA consistent with UTI. Keflex  sent. Culture ordered.

## 2024-02-14 NOTE — Telephone Encounter (Signed)
 Pt offered appt for today, but states that she does not drive and does not have a ride. States that she could see if anyone was coming that way and possibly send a urine sample. Please advise.

## 2024-02-14 NOTE — Addendum Note (Signed)
 Addended by: RANDINE ARNULFO MATSU on: 02/14/2024 10:22 AM   Modules accepted: Orders

## 2024-02-14 NOTE — Telephone Encounter (Signed)
 FYI Only or Action Required?: Action required by provider: update on patient condition and wanting medication for UTI.   Patient was last seen in primary care on 01/24/2024 by [name] Annabella HERO, FNP.  Called Nurse Triage reporting Dysuria.  Symptoms began several days ago.  Interventions attempted: OTC medications: AZO and Rest, hydration, or home remedies.  Symptoms are: unchanged.  Triage Disposition: See Physician Within 24 Hours  Patient/caregiver understands and will follow disposition?: No, wishes to speak with PCP       Copied from CRM #[id number]. Topic: Clinical - Red Word Triage >> Feb 14, 2024  8:15 AM [name] [name] wrote: [facility] that prompted transfer to Nurse Triage: UTI? Urine burning and foul smell she says Reason for Disposition  All other patients with painful urination  (Exception: [1] EITHER frequency or urgency AND [2] has on-call doctor.)  Answer Assessment - Initial Assessment Questions 1. SEVERITY: How bad is the pain?  (e.[name]., Scale 1-10; mild, moderate, or severe)     7/10 2. FREQUENCY: How many times have you had painful urination today?      Yes frequency 3. PATTERN: Is pain present every time you urinate or just sometimes?      Every time 4. ONSET: When did the painful urination start?      Sunday  5. FEVER: Do you have a fever? If Yes, ask: What is your temperature, how was it measured, and when did it start?     no 6. PAST UTI: Have you had a urine infection before? If Yes, ask: When was the last time? and What happened that time?  Yes several with in last year 7. CAUSE: What do you think is causing the painful urination?  (e.[name]., UTI, scratch, Herpes sore)     uti 8. OTHER SYMPTOMS: Do you have any other symptoms? (e.[name]., blood in urine, flank pain, genital sores, urgency, vaginal discharge)     Odor, dark urine  Protocols used: Urination Pain - Female-A-AH

## 2024-02-14 NOTE — Addendum Note (Signed)
 Addended by: JOESPH ANNABELLA HERO on: 02/14/2024 12:50 PM   Modules accepted: Orders

## 2024-02-16 ENCOUNTER — Encounter: Payer: Self-pay | Admitting: Family Medicine

## 2024-02-16 ENCOUNTER — Inpatient Hospital Stay

## 2024-02-16 ENCOUNTER — Other Ambulatory Visit: Payer: Self-pay | Admitting: Family Medicine

## 2024-02-16 VITALS — BP 121/67 | HR 77 | Temp 97.8°F | Resp 18

## 2024-02-16 DIAGNOSIS — D500 Iron deficiency anemia secondary to blood loss (chronic): Secondary | ICD-10-CM | POA: Diagnosis not present

## 2024-02-16 DIAGNOSIS — N3281 Overactive bladder: Secondary | ICD-10-CM

## 2024-02-16 MED ORDER — METOPROLOL SUCCINATE ER 50 MG PO TB24: 150.0000 mg | ORAL_TABLET | Freq: Every day | ORAL | 3 refills | Status: DC

## 2024-02-16 MED ORDER — SODIUM CHLORIDE 0.9 % IV SOLN
510.0000 mg | Freq: Once | INTRAVENOUS | Status: AC
  Administered 2024-02-16: 510 mg via INTRAVENOUS
  Filled 2024-02-16: qty 510

## 2024-02-16 MED ORDER — SODIUM CHLORIDE 0.9 % IV SOLN: Freq: Once | INTRAVENOUS | Status: AC

## 2024-02-16 NOTE — Patient Instructions (Signed)
 [facility] [place] - [facility]  Discharge Instructions: Thank you for choosing [facility] to provide your oncology and hematology care.  If you have a lab appointment with the Cancer Center - please note that after April 8th, 2024, all labs will be drawn in the cancer center.  You do not have to check in or register with the main entrance as you have in the past but will complete your check-in in the cancer center.  Wear comfortable clothing and clothing appropriate for easy access to any Portacath or PICC line.   We strive to give you quality time with your provider. You may need to reschedule your appointment if you arrive late (15 or more minutes).  Arriving late affects you and other patients whose appointments are after yours.  Also, if you miss three or more appointments without notifying the office, you may be dismissed from the clinic at the provider's discretion.      For prescription refill requests, have your pharmacy contact our office and allow 72 hours for refills to be completed.    Today you received the following chemotherapy and/or immunotherapy agents feraheme. Ferumoxytol Injection What is this medication? FERUMOXYTOL (FER ue MOX i tol) treats low levels of iron in your body (iron deficiency anemia). Iron is a mineral that plays an important role in making red blood cells, which carry oxygen from your lungs to the rest of your body. This medicine may be used for other purposes; ask your health care provider or pharmacist if you have questions. COMMON BRAND NAME(S): Feraheme What should I tell my care team before I take this medication? They need to know if you have any of these conditions: Anemia not caused by low iron levels High levels of iron in the blood Magnetic resonance imaging (MRI) test scheduled An unusual or allergic reaction to iron, other medications, foods, dyes, or preservatives Pregnant or trying to get  pregnant Breastfeeding How should I use this medication? This medication is injected into a vein. It is given by your care team in a hospital or clinic setting. Talk to your care team the use of this medication in children. Special care may be needed. Overdosage: If you think you have taken too much of this medicine contact a poison control center or emergency room at once. NOTE: This medicine is only for you. Do not share this medicine with others. What if I miss a dose? It is important not to miss your dose. Call your care team if you are unable to keep an appointment. What may interact with this medication? Other iron products This list may not describe all possible interactions. Give your health care provider a list of all the medicines, herbs, non-prescription drugs, or dietary supplements you use. Also tell them if you smoke, drink alcohol, or use illegal drugs. Some items may interact with your medicine. What should I watch for while using this medication? Visit your care team for regular checks on your progress. Tell your care team if your symptoms do not start to get better or if they get worse. You may need blood work done while you are taking this medication. You may need to eat more foods that contain iron. Talk to your care team. Foods that contain iron include whole grains or cereals, dried fruits, beans, peas, leafy green vegetables, and organ meats (liver, kidney). What side effects may I notice from receiving this medication? Side effects that  you should report to your care team as soon as possible: Allergic reactions--skin rash, itching, hives, swelling of the face, lips, tongue, or throat Low blood pressure--dizziness, feeling faint or lightheaded, blurry vision Shortness of breath Side effects that usually do not require medical attention (report to your care team if they continue or are bothersome): Flushing Headache Joint pain Muscle pain Nausea Pain, redness, or  irritation at injection site This list may not describe all possible side effects. Call your doctor for medical advice about side effects. You may report side effects to FDA at 1-800-FDA-1088. Where should I keep my medication? This medication is given in a hospital or clinic. It will not be stored at home. NOTE: This sheet is a summary. It may not cover all possible information. If you have questions about this medicine, talk to your doctor, pharmacist, or health care provider.  2024 Elsevier/Gold Standard (2023-03-01 00:00:00)      To help prevent nausea and vomiting after your treatment, we encourage you to take your nausea medication as directed.  BELOW ARE SYMPTOMS THAT SHOULD BE REPORTED IMMEDIATELY: *FEVER GREATER THAN 100.4 F (38 C) OR HIGHER *CHILLS OR SWEATING *NAUSEA AND VOMITING THAT IS NOT CONTROLLED WITH YOUR NAUSEA MEDICATION *UNUSUAL SHORTNESS OF BREATH *UNUSUAL BRUISING OR BLEEDING *URINARY PROBLEMS (pain or burning when urinating, or frequent urination) *BOWEL PROBLEMS (unusual diarrhea, constipation, pain near the anus) TENDERNESS IN MOUTH AND THROAT WITH OR WITHOUT PRESENCE OF ULCERS (sore throat, sores in mouth, or a toothache) UNUSUAL RASH, SWELLING OR PAIN  UNUSUAL VAGINAL DISCHARGE OR ITCHING   Items with * indicate a potential emergency and should be followed up as soon as possible or go to the Emergency Department if any problems should occur.  Please show the CHEMOTHERAPY ALERT CARD or IMMUNOTHERAPY ALERT CARD at check-in to the Emergency Department and triage nurse.  Should you have questions after your visit or need to cancel or reschedule your appointment, please contact [facility] [place] - A DEPT OF Eligh[facility] Bridegroom [facility] [phone]  and follow the prompts.  Office hours are 8:00 a.m. to 4:30 p.m. Monday - Friday. Please note that voicemails left after 4:00 p.m. may not be returned until the following business day.  We are closed weekends  and major holidays. You have access to a nurse at all times for urgent questions. Please call the main number to the clinic [phone] and follow the prompts.  For any non-urgent questions, you may also contact your provider using MyChart. We now offer e-Visits for anyone 38 and older to request care online for non-urgent symptoms. For details visit [email or web address].   Also download the MyChart app! Go to the app store, search "MyChart", open the app, select [place], and log in with your MyChart username and password.

## 2024-02-16 NOTE — Telephone Encounter (Signed)
 Copied from CRM [id number]. Topic: Clinical - Medication Refill >> Feb 16, 2024  1:24 PM Ivette P wrote: Medication: metoprolol  succinate (TOPROL -XL) 50 MG 24 hr tablet   Has the patient contacted their pharmacy? No (Agent: If no, request that the patient contact the pharmacy for the refill. If patient does not wish to contact the pharmacy document the reason why and proceed with request.) (Agent: If yes, when and what did the pharmacy advise?)  This is the patient's preferred pharmacy:  [id number] - [place], [place] - [address] [place] [address] Phone: [phone] Fax: [phone]  [facility] [address] [place] [address] Phone: [phone] Fax: [phone]  Is this the correct pharmacy for this prescription? Yes If no, delete pharmacy and type the correct one.   Has the prescription been filled recently? Yes  Is the patient out of the medication? Yes  Has the patient been seen for an appointment in the last year OR does the patient have an upcoming appointment? Yes  Can we respond through MyChart? Yes  Agent: Please be advised that Rx refills may take up to 3 business days. We ask that you follow-up with your pharmacy.

## 2024-02-16 NOTE — Telephone Encounter (Signed)
 Routed rx request encounter to provider

## 2024-02-16 NOTE — Progress Notes (Signed)
 Patient presents today for Feraheme  infusion. Vital signs stable. Patient denies any side effects related to last iron infusion. Pre-medications taken prior to arrival @ 08:30 am.   Feraheme  given today per MD orders. Tolerated infusion without adverse affects. Vital signs stable. No complaints at this time. Discharged from clinic by wheel chair in stable condition. Alert and oriented x 3. F/U with [facility] as scheduled.

## 2024-02-23 ENCOUNTER — Inpatient Hospital Stay: Attending: Physician Assistant

## 2024-02-23 VITALS — BP 104/60 | HR 86 | Temp 96.4°F | Resp 18

## 2024-02-23 DIAGNOSIS — D500 Iron deficiency anemia secondary to blood loss (chronic): Secondary | ICD-10-CM | POA: Insufficient documentation

## 2024-02-23 DIAGNOSIS — Z79899 Other long term (current) drug therapy: Secondary | ICD-10-CM | POA: Insufficient documentation

## 2024-02-23 MED ORDER — SODIUM CHLORIDE 0.9 % IV SOLN
510.0000 mg | Freq: Once | INTRAVENOUS | Status: AC
  Administered 2024-02-23: 510 mg via INTRAVENOUS
  Filled 2024-02-23: qty 510

## 2024-02-23 MED ORDER — SODIUM CHLORIDE 0.9 % IV SOLN: Freq: Once | INTRAVENOUS | Status: AC

## 2024-02-23 NOTE — Progress Notes (Signed)
 Patient presents today for iron infusion.  Patient is in satisfactory condition with no new complaints voiced.  Vital signs are stable.  IV placed in L arm.  IV flushed well with good blood return noted.  Tylenol  and Zyrtec  taken at 0730 prior to visit.  We will proceed with infusion per provider orders.    Patient tolerated infusion well with no complaints voiced.  Patient left via wheelchair with son in stable condition.  Vital signs stable at discharge.  Follow up as scheduled.

## 2024-02-23 NOTE — Patient Instructions (Signed)
 [facility] [place] [facility]  Discharge Instructions: Thank you for choosing [facility] to provide your oncology and hematology care.  If you have a lab appointment with the Cancer Center - please note that after April 8th, 2024, all labs will be drawn in the cancer center.  You do not have to check in or register with the main entrance as you have in the past but will complete your check-in in the cancer center.  Wear comfortable clothing and clothing appropriate for easy access to any Portacath or PICC line.   We strive to give you quality time with your provider. You may need to reschedule your appointment if you arrive late (15 or more minutes).  Arriving late affects you and other patients whose appointments are after yours.  Also, if you miss three or more appointments without notifying the office, you may be dismissed from the clinic at the provider's discretion.      For prescription refill requests, have your pharmacy contact our office and allow 72 hours for refills to be completed.    Today you received the following:  Feraheme.  Ferumoxytol Injection What is this medication? FERUMOXYTOL (FER ue MOX i tol) treats low levels of iron in your body (iron deficiency anemia). Iron is a mineral that plays an important role in making red blood cells, which carry oxygen from your lungs to the rest of your body. This medicine may be used for other purposes; ask your health care provider or pharmacist if you have questions. COMMON BRAND NAME(S): Feraheme What should I tell my care team before I take this medication? They need to know if you have any of these conditions: Anemia not caused by low iron levels High levels of iron in the blood Magnetic resonance imaging (MRI) test scheduled An unusual or allergic reaction to iron, other medications, foods, dyes, or preservatives Pregnant or trying to get pregnant Breastfeeding How should I  use this medication? This medication is injected into a vein. It is given by your care team in a hospital or clinic setting. Talk to your care team the use of this medication in children. Special care may be needed. Overdosage: If you think you have taken too much of this medicine contact a poison control center or emergency room at once. NOTE: This medicine is only for you. Do not share this medicine with others. What if I miss a dose? It is important not to miss your dose. Call your care team if you are unable to keep an appointment. What may interact with this medication? Other iron products This list may not describe all possible interactions. Give your health care provider a list of all the medicines, herbs, non-prescription drugs, or dietary supplements you use. Also tell them if you smoke, drink alcohol, or use illegal drugs. Some items may interact with your medicine. What should I watch for while using this medication? Visit your care team for regular checks on your progress. Tell your care team if your symptoms do not start to get better or if they get worse. You may need blood work done while you are taking this medication. You may need to eat more foods that contain iron. Talk to your care team. Foods that contain iron include whole grains or cereals, dried fruits, beans, peas, leafy green vegetables, and organ meats (liver, kidney). What side effects may I notice from receiving this medication? Side effects that you should  report to your care team as soon as possible: Allergic reactions--skin rash, itching, hives, swelling of the face, lips, tongue, or throat Low blood pressure--dizziness, feeling faint or lightheaded, blurry vision Shortness of breath Side effects that usually do not require medical attention (report to your care team if they continue or are bothersome): Flushing Headache Joint pain Muscle pain Nausea Pain, redness, or irritation at injection site This list  may not describe all possible side effects. Call your doctor for medical advice about side effects. You may report side effects to FDA at 1-800-FDA-1088. Where should I keep my medication? This medication is given in a hospital or clinic. It will not be stored at home. NOTE: This sheet is a summary. It may not cover all possible information. If you have questions about this medicine, talk to your doctor, pharmacist, or health care provider.  2024 Elsevier/Gold Standard (2023-03-01 00:00:00)    To help prevent nausea and vomiting after your treatment, we encourage you to take your nausea medication as directed.  BELOW ARE SYMPTOMS THAT SHOULD BE REPORTED IMMEDIATELY: *FEVER GREATER THAN 100.4 F (38 C) OR HIGHER *CHILLS OR SWEATING *NAUSEA AND VOMITING THAT IS NOT CONTROLLED WITH YOUR NAUSEA MEDICATION *UNUSUAL SHORTNESS OF BREATH *UNUSUAL BRUISING OR BLEEDING *URINARY PROBLEMS (pain or burning when urinating, or frequent urination) *BOWEL PROBLEMS (unusual diarrhea, constipation, pain near the anus) TENDERNESS IN MOUTH AND THROAT WITH OR WITHOUT PRESENCE OF ULCERS (sore throat, sores in mouth, or a toothache) UNUSUAL RASH, SWELLING OR PAIN  UNUSUAL VAGINAL DISCHARGE OR ITCHING   Items with * indicate a potential emergency and should be followed up as soon as possible or go to the Emergency Department if any problems should occur.  Please show the CHEMOTHERAPY ALERT CARD or IMMUNOTHERAPY ALERT CARD at check-in to the Emergency Department and triage nurse.  Should you have questions after your visit or need to cancel or reschedule your appointment, please contact [facility] [place] - A DEPT OF Eligha Bridegroom [facility] [phone]  and follow the prompts.  Office hours are 8:00 a.m. to 4:30 p.m. Monday - Friday. Please note that voicemails left after 4:00 p.m. may not be returned until the following business day.  We are closed weekends and major holidays. You have access to a  nurse at all times for urgent questions. Please call the main number to the clinic [phone] and follow the prompts.  For any non-urgent questions, you may also contact your provider using MyChart. We now offer e-Visits for anyone 39 and older to request care online for non-urgent symptoms. For details visit [email or web address].   Also download the MyChart app! Go to the app store, search "MyChart", open the app, select [place], and log in with your MyChart username and password.

## 2024-03-21 ENCOUNTER — Other Ambulatory Visit: Payer: Self-pay | Admitting: Cardiology

## 2024-03-29 ENCOUNTER — Other Ambulatory Visit: Payer: Self-pay | Admitting: Family Medicine

## 2024-03-29 DIAGNOSIS — M1A9XX0 Chronic gout, unspecified, without tophus (tophi): Secondary | ICD-10-CM

## 2024-04-05 MED ORDER — OXYBUTYNIN CHLORIDE ER 10 MG PO TB24: 10.0000 mg | ORAL_TABLET | Freq: Every day | ORAL | 0 refills | Status: DC

## 2024-04-15 ENCOUNTER — Telehealth: Payer: Self-pay | Admitting: Cardiology

## 2024-04-15 DIAGNOSIS — R0602 Shortness of breath: Secondary | ICD-10-CM

## 2024-04-15 NOTE — Telephone Encounter (Signed)
 Spoke with pt regarding her shortness of breath. Pt stated she has noticed over the last week and a half that she is becoming more short of breath with activity such as walking around the house. Pt stated she has been checking her O2 and it has stayed between 95 and 99. She has been checking her O2 with a pulse ox. Pt denied an chest pain, lightheadedness or dizziness. Pt stated she does feel some fluttering in her chest that comes and goes. Pt is taking her Eliquis . Pt was told that Dr. Lavona would be notified. Pt verbalized understanding. All questions if any were answered.

## 2024-04-15 NOTE — Telephone Encounter (Signed)
 Pt c/o Shortness Of Breath: STAT if SOB developed within the last 24 hours or pt is noticeably SOB on the phone  1. Are you currently SOB (can you hear that pt is SOB on the phone)?  No   2. How long have you been experiencing SOB?  About 1.5 weeks, gradually becoming worse. O2 is fine, stays around 95-99.   3. Are you SOB when sitting or when up moving around?  When up and moving around   4. Are you currently experiencing any other symptoms?  No

## 2024-04-17 ENCOUNTER — Ambulatory Visit (HOSPITAL_COMMUNITY)
Admission: RE | Admit: 2024-04-17 | Discharge: 2024-04-17 | Disposition: A | Discharge status: Home / Self Care | Source: Ambulatory Visit | Attending: Cardiology | Admitting: Cardiology

## 2024-04-17 DIAGNOSIS — R0602 Shortness of breath: Secondary | ICD-10-CM | POA: Insufficient documentation

## 2024-04-17 DIAGNOSIS — I700 Atherosclerosis of aorta: Secondary | ICD-10-CM | POA: Diagnosis not present

## 2024-04-17 DIAGNOSIS — K921 Melena: Secondary | ICD-10-CM | POA: Diagnosis not present

## 2024-04-17 DIAGNOSIS — K922 Gastrointestinal hemorrhage, unspecified: Secondary | ICD-10-CM | POA: Diagnosis not present

## 2024-04-17 DIAGNOSIS — I517 Cardiomegaly: Secondary | ICD-10-CM | POA: Diagnosis not present

## 2024-04-17 DIAGNOSIS — D649 Anemia, unspecified: Secondary | ICD-10-CM | POA: Diagnosis not present

## 2024-04-17 DIAGNOSIS — K31811 Angiodysplasia of stomach and duodenum with bleeding: Secondary | ICD-10-CM | POA: Diagnosis not present

## 2024-04-17 NOTE — Progress Notes (Signed)
 Cardiology Office Note    Date:  04/19/2024  ID:  [name] [name], [name] [name] 11/10/1940, MRN [id number] PCP:  Joesph Annabella HERO, FNP  Cardiologist:  Lynwood Schilling, MD  Electrophysiologist:  None   Chief Complaint: Fatigue and shortness of breath   History of Present Illness: .    [name] [name] is a 83 y.o. female with visit-pertinent history of chronic HFrEF, mitral valve disease, hypertension, CKD stage IIIa, iron deficiency anemia, GI bleeding, aortic atherosclerosis and persistent atrial fibrillation s/p LAAO with Watchman on 06/15/2023.  Echocardiogram on 02/07/2021 revealed EF 35 to 40%, moderate LA elevated PASP with estimated RVSP 51.5 mmHg, severely dilated LA, moderately dilated RA, severe MR, moderate TR, mild aortic sclerosis.  On repeat echo in 03/2021 was found to have a normal ejection fraction of 55 to 60% with normal LV function.  It was found that she had severe mitral valve regurgitation with no evidence of mitral valve stenosis, moderate annular calcification.  TEE in December 2022 indicated her MR was moderate, reviewed by structural team and intervention was not suggested.  On 06/15/2023 patient underwent left atrial appendage occlusion with a 24 mm Watchman FLX Pro device, she was continued on Eliquis  2.5 mg twice daily however this was stopped 45 days after implant and Plavix  initiated.  TEE on 08/18/2023 indicated LVEF 50 to 55%, no RWMA, RV systolic function and size was normal, well-seated Watchman device in the left atrial appendage with excellent apposition and no evidence of leak.  There was no evidence of mitral valve regurgitation or stenosis.  Patient was admitted in June 2025 with acute respiratory failure and PE.  She had a rapid atrial fibrillation related to volume overload and respiratory failure and pulmonary emboli.  She underwent diuresis and had rate control and was restarted on anticoagulation.  She was started on Farxiga .  Echocardiogram on 01/15/2024  indicated LVEF 40 to 45%, mild LVH, G1 DD, RV systolic function was mildly reduced mildly increased right ventricular wall thickness, LA was severely dilated, RA was mildly dilated, moderate mitral valve regurgitation, moderate to severe mitral stenosis and aortic valve sclerosis present without evidence of stenosis.  Patient was seen in clinic by Dr. Schilling on 02/07/2024 for follow-up.  She was doing well at that time.  Reported that she was only using Lasix  as needed however had not required it since her hospitalization.  Today she presents regarding increased shortness of breath.  Patient reports that in recent weeks she has started noticing increased shortness of breath, fatigue and decreased activity tolerance.  She reports that she has been having to use her bedside commode as she feels significantly fatigued walking into her bathroom.  Patient reports this is how she felt previously when anemic.  She does not feel as though she is holding more fluid although notes that she did try taking Lasix  last 2 days with some noted improvement in her shortness of breath.  Patient denies noting any blood in her stool or in her urine however notes that she previously did not notice this either.  She does feel as though she is having increased palpitations and as if her heart is beating faster and atrial fibrillation when she is exerting herself.  She denies any significant chest pain, lower extremity edema, orthopnea or PND.  She notes that she does feel slightly lightheaded with position changes.  Patient's weights have remained stable. ROS: .   Today she denies lower extremity edema, fatigue, melena, hematuria, hemoptysis, diaphoresis,  syncope,  orthopnea, and PND.  All other systems are reviewed and otherwise negative. Studies Reviewed: SABRA   EKG:  EKG is ordered today, personally reviewed, demonstrating  EKG Interpretation Date/Time:  Friday April 19 2024 14:42:18 EDT Ventricular Rate:  97 PR  Interval:    QRS Duration:  102 QT Interval:  358 QTC Calculation: 454 R Axis:   0  Text Interpretation: Atrial fibrillation Nonspecific ST and T wave abnormality When compared with ECG of 15-Jan-2024 06:05, No significant change was found Confirmed by Burrell Hodapp [phone]) on 04/19/2024 3:39:42 PM   CV Studies: Cardiac studies reviewed are outlined and summarized above. Otherwise please see EMR for full report. Cardiac Studies & Procedures   ______________________________________________________________________________________________     ECHOCARDIOGRAM  ECHOCARDIOGRAM COMPLETE 01/15/2024  Narrative ECHOCARDIOGRAM REPORT    Patient Name:   [name] [name] Date of Exam: 01/15/2024 Medical Rec #:  [id number]           Height:       63.0 in Accession #:    [id number]          Weight:       204.9 lb Date of Birth:  08/04/1940           BSA:          1.954 m Patient Age:    83 years            BP:           118/66 mmHg Patient Gender: F                   HR:           86 bpm. Exam Location:  Inpatient  Procedure: 2D Echo, Cardiac Doppler, Color Doppler and Intracardiac Opacification Agent (Both Spectral and Color Flow Doppler were utilized during procedure).  Indications:    Pulmonary Embolus I26.09  History:        Patient has prior history of Echocardiogram examinations, most recent 08/18/2023.  Sonographer:    Tinnie Gosling RDCS Referring Phys: [id number] ANASTASSIA DOUTOVA  IMPRESSIONS   1. Left ventricular ejection fraction, by estimation, is 40 to 45%. The left ventricle has mildly decreased function. The left ventricle demonstrates global hypokinesis. There is mild left ventricular hypertrophy. Left ventricular diastolic parameters are consistent with Grade I diastolic dysfunction (impaired relaxation). 2. Right ventricular systolic function is mildly reduced. The right ventricular size is normal. Mildly increased right ventricular wall thickness. 3. Left atrial size was  severely dilated. 4. Right atrial size was mildly dilated. 5. The mitral valve is normal in structure. Moderate mitral valve regurgitation. Moderate to severe mitral stenosis. 6. The aortic valve is tricuspid. Aortic valve regurgitation is not visualized. Aortic valve sclerosis is present, with no evidence of aortic valve stenosis.  FINDINGS Left Ventricle: Left ventricular ejection fraction, by estimation, is 40 to 45%. The left ventricle has mildly decreased function. The left ventricle demonstrates global hypokinesis. The left ventricular internal cavity size was normal in size. There is mild left ventricular hypertrophy. Left ventricular diastolic parameters are consistent with Grade I diastolic dysfunction (impaired relaxation).  Right Ventricle: The right ventricular size is normal. Mildly increased right ventricular wall thickness. Right ventricular systolic function is mildly reduced.  Left Atrium: Left atrial size was severely dilated.  Right Atrium: Right atrial size was mildly dilated.  Pericardium: There is no evidence of pericardial effusion.  Mitral Valve: Velocity not optimized for PISA method but MR is worse than previous and is likely  in the moderate to severe range. The mitral valve is normal in structure. Mildly decreased mobility of the mitral valve leaflets. Moderate mitral valve regurgitation. Moderate to severe mitral valve stenosis.  Tricuspid Valve: The tricuspid valve is normal in structure. Tricuspid valve regurgitation is mild.  Aortic Valve: The aortic valve is tricuspid. Aortic valve regurgitation is not visualized. Aortic valve sclerosis is present, with no evidence of aortic valve stenosis.  Pulmonic Valve: The pulmonic valve was normal in structure. Pulmonic valve regurgitation is mild.  Aorta: The aortic root and ascending aorta are structurally normal, with no evidence of dilitation.  IAS/Shunts: No atrial level shunt detected by color flow  Doppler.   LEFT VENTRICLE PLAX 2D LVIDd:         5.30 cm LVIDs:         4.40 cm LV PW:         1.00 cm LV IVS:        1.00 cm LVOT diam:     2.20 cm LV SV:         39 LV SV Index:   20 LVOT Area:     3.80 cm  LV Volumes (MOD) LV vol d, MOD A2C: 103.0 ml LV vol d, MOD A4C: 145.0 ml LV vol s, MOD A2C: 62.8 ml LV vol s, MOD A4C: 71.8 ml LV SV MOD A2C:     40.2 ml LV SV MOD A4C:     145.0 ml LV SV MOD BP:      58.4 ml  RIGHT VENTRICLE         IVC TAPSE (M-mode): 1.4 cm  IVC diam: 2.50 cm  LEFT ATRIUM              Index        RIGHT ATRIUM           Index LA diam:        3.50 cm  1.79 cm/m   RA Area:     22.10 cm LA Vol (A2C):   141.0 ml 72.17 ml/m  RA Volume:   59.80 ml  30.61 ml/m LA Vol (A4C):   150.0 ml 76.78 ml/m LA Biplane Vol: 152.0 ml 77.80 ml/m AORTIC VALVE LVOT Vmax:   70.30 cm/s LVOT Vmean:  43.300 cm/s LVOT VTI:    0.103 m  AORTA Ao Root diam: 3.40 cm Ao Asc diam:  3.80 cm  MITRAL VALVE MV Area (PHT): 6.71 cm     SHUNTS MV Decel Time: 113 msec     Systemic VTI:  0.10 m MV E velocity: 104.00 cm/s  Systemic Diam: 2.20 cm MV A velocity: 41.40 cm/s MV E/A ratio:  2.51  Morene Brownie Electronically signed by Morene Brownie Signature Date/Time: 01/15/2024/11:15:51 AM    Final   TEE  ECHO TEE 08/18/2023  Narrative TRANSESOPHOGEAL ECHO REPORT    Patient Name:   [name] [name] Date of Exam: 08/18/2023 Medical Rec #:  [id number]           Height:       63.0 in Accession #:    [id number]          Weight:       202.0 lb Date of Birth:  13-Dec-1940           BSA:          1.942 m Patient Age:    83 years            BP:  137/93 mmHg Patient Gender: F                   HR:           112 bpm. Exam Location:  Inpatient  Procedure: Cardiac Doppler, Color Doppler, Intracardiac Opacification Agent, Transesophageal Echo and 3D Echo  Indications:     Watchman Eval  History:         Patient has prior history of Echocardiogram  examinations, most recent 02/07/2021.  Sonographer:     Tinnie Gosling RDCS Referring Phys:  [id number] OLE ONEIDA HOLTS Diagnosing Phys: Jerel Balding MD  PROCEDURE: The transesophogeal probe was passed without difficulty through the esophogus of the patient. Sedation performed by different physician. The patient's vital signs; including heart rate, blood pressure, and oxygen  saturation; remained stable throughout the procedure. The patient developed no complications during the procedure.  IMPRESSIONS   1. Left ventricular ejection fraction, by estimation, is 50 to 55%. The left ventricle has low normal function. The left ventricle has no regional wall motion abnormalities. 2. Right ventricular systolic function is normal. The right ventricular size is normal. There is normal pulmonary artery systolic pressure. The estimated right ventricular systolic pressure is 22.4 mmHg. 3. There is a well seated Watchman device in the left atrial appendage with excellent apposition and no evidence of leak. Left atrial size was moderately dilated. No left atrial/left atrial appendage thrombus was detected. 4. The mitral valve is normal in structure. No evidence of mitral valve regurgitation. No evidence of mitral stenosis. 5. The aortic valve is normal in structure. Aortic valve regurgitation is not visualized. No aortic stenosis is present. 6. The inferior vena cava is normal in size with greater than 50% respiratory variability, suggesting right atrial pressure of 3 mmHg. 7. Rhythm strip during this exam demonstrates atrial fibrillation. There is rapid ventricular response (max 151 bpm, average 115 bpm).  FINDINGS Left Ventricle: Left ventricular ejection fraction, by estimation, is 50 to 55%. The left ventricle has low normal function. The left ventricle has no regional wall motion abnormalities. The left ventricular internal cavity size was normal in size. There is no left ventricular  hypertrophy.  Right Ventricle: The right ventricular size is normal. No increase in right ventricular wall thickness. Right ventricular systolic function is normal. There is normal pulmonary artery systolic pressure. The tricuspid regurgitant velocity is 2.20 m/s, and with an assumed right atrial pressure of 3 mmHg, the estimated right ventricular systolic pressure is 22.4 mmHg.  Left Atrium: There is a well seated Watchman device in the left atrial appendage with excellent apposition and no evidence of leak. Left atrial size was moderately dilated. No left atrial/left atrial appendage thrombus was detected.  Right Atrium: Right atrial size was normal in size.  Pericardium: There is no evidence of pericardial effusion.  Mitral Valve: The mitral valve is normal in structure. No evidence of mitral valve regurgitation. No evidence of mitral valve stenosis.  Tricuspid Valve: The tricuspid valve is normal in structure. Tricuspid valve regurgitation is mild . No evidence of tricuspid stenosis.  Aortic Valve: The aortic valve is normal in structure. Aortic valve regurgitation is not visualized. No aortic stenosis is present.  Pulmonic Valve: The pulmonic valve was normal in structure. Pulmonic valve regurgitation is trivial. No evidence of pulmonic stenosis.  Aorta: The aortic root is normal in size and structure.  Venous: The inferior vena cava is normal in size with greater than 50% respiratory variability, suggesting right atrial pressure of 3 mmHg.  IAS/Shunts: No atrial level shunt detected by color flow Doppler.  EKG: Rhythm strip during this exam demonstrates atrial fibrillation.   TRICUSPID VALVE TR Peak grad:   19.4 mmHg TR Vmax:        220.00 cm/s  Jerel Croitoru MD Electronically signed by Jerel Balding MD Signature Date/Time: 08/18/2023/2:11:19 PM    Final  MONITORS  LONG TERM MONITOR (3-14 DAYS) 09/14/2023  Narrative Rhythm was atrial fib. Ventricular rate was  mildly increased on average and very rapid at times. Occasional ventricular ectopy was not sustained.       ______________________________________________________________________________________________       Current Reported Medications:.    Current Meds  Medication Sig   acetaminophen  (TYLENOL ) 325 MG tablet Take 2 tablets (650 mg total) by mouth every 6 (six) hours as needed for mild pain or headache (or Fever >/= 101).   allopurinol  (ZYLOPRIM ) 100 MG tablet Take 1 tablet by mouth once daily   apixaban  (ELIQUIS ) 5 MG TABS tablet Take 1 tablet (5 mg total) by mouth 2 (two) times daily.   atorvastatin  (LIPITOR) 20 MG tablet Take 1 tablet (20 mg total) by mouth daily.   cetirizine  (ZYRTEC ) 10 MG tablet Take 10 mg by mouth daily.   dapagliflozin  propanediol (FARXIGA ) 10 MG TABS tablet Take 1 tablet (10 mg total) by mouth daily.   ferrous sulfate  325 (65 FE) MG EC tablet Take 325 mg by mouth daily.   furosemide  (LASIX ) 20 MG tablet Take 1 tablet (20 mg total) by mouth daily as needed for fluid or edema.   GNP CRANBERRY EXTRACT PO Take 1 tablet by mouth in the morning.   levothyroxine  (SYNTHROID ) 50 MCG tablet Take 1 tablet (50 mcg total) by mouth daily before breakfast.   metoprolol  succinate (TOPROL -XL) 50 MG 24 hr tablet Take 3 tablets (150 mg total) by mouth daily.   oxybutynin  (DITROPAN -XL) 10 MG 24 hr tablet Take 1 tablet (10 mg total) by mouth at bedtime.   pantoprazole  (PROTONIX ) 40 MG tablet Take 1 tablet (40 mg total) by mouth 2 (two) times daily before a meal.   polyethylene glycol powder (GLYCOLAX /MIRALAX ) 17 GM/SCOOP powder Take 1 capful (17 g) by mouth daily as needed for moderate constipation.    Physical Exam:   VS:  BP 126/64   Pulse (!) 102   Ht 5' 3 (1.6 m)   Wt 193 lb (87.5 kg)   SpO2 96%   BMI 34.19 kg/m    Wt Readings from Last 3 Encounters:  04/19/24 193 lb (87.5 kg)  02/07/24 200 lb (90.7 kg)  01/24/24 199 lb 9.6 oz (90.5 kg)    GEN: Well nourished,  well developed in no acute distress NECK: No JVD; No carotid bruits CARDIAC: IRIR, no murmurs, rubs, gallops RESPIRATORY:  Clear to auscultation without rales, wheezing or rhonchi  ABDOMEN: Soft, non-tender, non-distended EXTREMITIES:  No edema; No acute deformity     Asessement and Plan:.    Persistent atrial fibrillation: Patient with history of persistent atrial fibrillation, now with rate control strategy.  Patient feels as though her heart rate does significantly elevate at home with exertion, reports can range from 70 to 140 bpm.  Will have patient wear a 3-day monitor to assess rate control.  Continue Eliquis  5 mg twice daily and metoprolol  succinate 150 mg daily.  PE: Patient with PE in June, restarted on Eliquis .  Patient does note increased shortness of breath, does not feel similar to her previous noted PE.  She reports compliance  with her Eliquis , denies missing any doses.  Question if patient is anemic as noted below.  HFrEF: Echocardiogram on 01/15/2024 indicated LVEF 40 to 45%, mild LVH, G1 DD, RV systolic function was mildly reduced mildly increased right ventricular wall thickness, LA was severely dilated, RA was mildly dilated, moderate mitral valve regurgitation, moderate to severe mitral stenosis and aortic valve sclerosis present without evidence of stenosis.  Patient reports increased shortness of breath, denies any significant weight change or increased lower extremity edema.  She does report she did try to take Lasix  the last 2 days with some noted improvement in her breathing however notes this persist and she has ongoing fatigue.  She appears euvolemic and well compensated on exam. Check CBC and BNP today.   Fatigue/History of GI bleed/iron deficiency anemia: Patient with significant history for GI bleeding and iron deficiency anemia resulting in watchman placement however in June patient had a pulmonary embolism and was restarted on Eliquis .  Patient reports increased  shortness of breath, fatigue and decreased activity tolerance in recent weeks, reports this feels similar to her previously noted anemia.  She denies noticing any increased bleeding however notes that she previously was unable to identify this. Reviewed ED precautions. Check CBC and BNP today.   Hypertension: Blood pressure today 126/64.  Continue current antihypertensive regimen.  CKD stage III: Last creatinine 1.29 on 04/17/2024.    Disposition: F/u with Dr. Lavona in 05/2024 as scheduled.   Signed, Mirissa Lopresti D Yesenia Fontenette, NP

## 2024-04-17 NOTE — Telephone Encounter (Signed)
 Lavona Agent, MD to [facility] Triage (Selected Message)     04/16/24  8:09 PM Please get a BMET and CXR.  She should have follow up in the office after both of those tests.   S/w patient. Went over the information above. Orders placed for bmp and cxr. Informed that she can come to this office, gave address to have lab work on 1st floor, then go to the second floor to have chest x ray afterwards. She plans to come today to have these completed. Also scheduled an appt with an APP 04/19/24. She verbalized understanding of all information.   Orders placed for bmp and chest x ray

## 2024-04-18 LAB — BASIC METABOLIC PANEL WITH GFR
BUN/Creatinine Ratio: 23 (ref 12–28)
BUN: 30 mg/dL — ABNORMAL HIGH (ref 8–27)
CO2: 15 mmol/L — ABNORMAL LOW (ref 20–29)
Calcium: 11 mg/dL — ABNORMAL HIGH (ref 8.7–10.3)
Chloride: 110 mmol/L — ABNORMAL HIGH (ref 96–106)
Creatinine, Ser: 1.29 mg/dL — ABNORMAL HIGH (ref 0.57–1.00)
Glucose: 97 mg/dL (ref 70–99)
Potassium: 4.7 mmol/L (ref 3.5–5.2)
Sodium: 142 mmol/L (ref 134–144)
eGFR: 41 mL/min/1.73 — ABNORMAL LOW (ref >59)

## 2024-04-19 ENCOUNTER — Ambulatory Visit: Attending: Cardiology | Admitting: Cardiology

## 2024-04-19 ENCOUNTER — Encounter (HOSPITAL_COMMUNITY): Payer: Self-pay | Admitting: *Deleted

## 2024-04-19 ENCOUNTER — Inpatient Hospital Stay (HOSPITAL_COMMUNITY)
Admission: EM | Admit: 2024-04-19 | Discharge: 2024-04-22 | DRG: 378 | Disposition: A | Source: Ambulatory Visit | Attending: Internal Medicine | Admitting: Internal Medicine

## 2024-04-19 ENCOUNTER — Emergency Department (HOSPITAL_COMMUNITY)

## 2024-04-19 ENCOUNTER — Telehealth: Payer: Self-pay | Admitting: Cardiology

## 2024-04-19 ENCOUNTER — Ambulatory Visit (INDEPENDENT_AMBULATORY_CARE_PROVIDER_SITE_OTHER)

## 2024-04-19 ENCOUNTER — Other Ambulatory Visit: Payer: Self-pay

## 2024-04-19 ENCOUNTER — Encounter: Payer: Self-pay | Admitting: Cardiology

## 2024-04-19 VITALS — BP 126/64 | HR 102 | Ht 63.0 in | Wt 193.0 lb

## 2024-04-19 DIAGNOSIS — Z86711 Personal history of pulmonary embolism: Secondary | ICD-10-CM | POA: Insufficient documentation

## 2024-04-19 DIAGNOSIS — K5730 Diverticulosis of large intestine without perforation or abscess without bleeding: Secondary | ICD-10-CM | POA: Diagnosis not present

## 2024-04-19 DIAGNOSIS — Z96641 Presence of right artificial hip joint: Secondary | ICD-10-CM | POA: Diagnosis present

## 2024-04-19 DIAGNOSIS — K5909 Other constipation: Secondary | ICD-10-CM | POA: Diagnosis present

## 2024-04-19 DIAGNOSIS — I5022 Chronic systolic (congestive) heart failure: Secondary | ICD-10-CM

## 2024-04-19 DIAGNOSIS — K921 Melena: Secondary | ICD-10-CM | POA: Diagnosis not present

## 2024-04-19 DIAGNOSIS — N1832 Chronic kidney disease, stage 3b: Secondary | ICD-10-CM | POA: Diagnosis not present

## 2024-04-19 DIAGNOSIS — K31819 Angiodysplasia of stomach and duodenum without bleeding: Secondary | ICD-10-CM | POA: Diagnosis not present

## 2024-04-19 DIAGNOSIS — K31811 Angiodysplasia of stomach and duodenum with bleeding: Principal | ICD-10-CM | POA: Diagnosis present

## 2024-04-19 DIAGNOSIS — Z8719 Personal history of other diseases of the digestive system: Secondary | ICD-10-CM | POA: Diagnosis not present

## 2024-04-19 DIAGNOSIS — N200 Calculus of kidney: Secondary | ICD-10-CM | POA: Diagnosis not present

## 2024-04-19 DIAGNOSIS — R0602 Shortness of breath: Secondary | ICD-10-CM

## 2024-04-19 DIAGNOSIS — D6832 Hemorrhagic disorder due to extrinsic circulating anticoagulants: Secondary | ICD-10-CM | POA: Diagnosis present

## 2024-04-19 DIAGNOSIS — I5020 Unspecified systolic (congestive) heart failure: Secondary | ICD-10-CM | POA: Diagnosis present

## 2024-04-19 DIAGNOSIS — Z800 Family history of malignant neoplasm of digestive organs: Secondary | ICD-10-CM

## 2024-04-19 DIAGNOSIS — I4891 Unspecified atrial fibrillation: Secondary | ICD-10-CM | POA: Diagnosis not present

## 2024-04-19 DIAGNOSIS — R195 Other fecal abnormalities: Secondary | ICD-10-CM | POA: Diagnosis present

## 2024-04-19 DIAGNOSIS — N1831 Chronic kidney disease, stage 3a: Secondary | ICD-10-CM | POA: Diagnosis present

## 2024-04-19 DIAGNOSIS — Z6834 Body mass index (BMI) 34.0-34.9, adult: Secondary | ICD-10-CM

## 2024-04-19 DIAGNOSIS — T45515A Adverse effect of anticoagulants, initial encounter: Secondary | ICD-10-CM | POA: Diagnosis present

## 2024-04-19 DIAGNOSIS — Z7901 Long term (current) use of anticoagulants: Secondary | ICD-10-CM

## 2024-04-19 DIAGNOSIS — K449 Diaphragmatic hernia without obstruction or gangrene: Secondary | ICD-10-CM | POA: Diagnosis present

## 2024-04-19 DIAGNOSIS — D500 Iron deficiency anemia secondary to blood loss (chronic): Secondary | ICD-10-CM | POA: Diagnosis not present

## 2024-04-19 DIAGNOSIS — E66811 Obesity, class 1: Secondary | ICD-10-CM | POA: Diagnosis present

## 2024-04-19 DIAGNOSIS — I10 Essential (primary) hypertension: Secondary | ICD-10-CM | POA: Diagnosis present

## 2024-04-19 DIAGNOSIS — M19041 Primary osteoarthritis, right hand: Secondary | ICD-10-CM | POA: Diagnosis present

## 2024-04-19 DIAGNOSIS — D508 Other iron deficiency anemias: Secondary | ICD-10-CM | POA: Diagnosis not present

## 2024-04-19 DIAGNOSIS — K219 Gastro-esophageal reflux disease without esophagitis: Secondary | ICD-10-CM | POA: Diagnosis present

## 2024-04-19 DIAGNOSIS — K922 Gastrointestinal hemorrhage, unspecified: Secondary | ICD-10-CM | POA: Diagnosis not present

## 2024-04-19 DIAGNOSIS — I132 Hypertensive heart and chronic kidney disease with heart failure and with stage 5 chronic kidney disease, or end stage renal disease: Secondary | ICD-10-CM | POA: Diagnosis not present

## 2024-04-19 DIAGNOSIS — I5042 Chronic combined systolic (congestive) and diastolic (congestive) heart failure: Secondary | ICD-10-CM | POA: Diagnosis present

## 2024-04-19 DIAGNOSIS — D649 Anemia, unspecified: Principal | ICD-10-CM

## 2024-04-19 DIAGNOSIS — M19042 Primary osteoarthritis, left hand: Secondary | ICD-10-CM | POA: Diagnosis present

## 2024-04-19 DIAGNOSIS — Z79899 Other long term (current) drug therapy: Secondary | ICD-10-CM

## 2024-04-19 DIAGNOSIS — Z7984 Long term (current) use of oral hypoglycemic drugs: Secondary | ICD-10-CM

## 2024-04-19 DIAGNOSIS — I700 Atherosclerosis of aorta: Secondary | ICD-10-CM | POA: Diagnosis not present

## 2024-04-19 DIAGNOSIS — Z86718 Personal history of other venous thrombosis and embolism: Secondary | ICD-10-CM

## 2024-04-19 DIAGNOSIS — I4819 Other persistent atrial fibrillation: Secondary | ICD-10-CM | POA: Diagnosis not present

## 2024-04-19 DIAGNOSIS — Z79890 Hormone replacement therapy (postmenopausal): Secondary | ICD-10-CM

## 2024-04-19 DIAGNOSIS — Z90710 Acquired absence of both cervix and uterus: Secondary | ICD-10-CM

## 2024-04-19 DIAGNOSIS — Z8049 Family history of malignant neoplasm of other genital organs: Secondary | ICD-10-CM

## 2024-04-19 DIAGNOSIS — E039 Hypothyroidism, unspecified: Secondary | ICD-10-CM | POA: Diagnosis present

## 2024-04-19 DIAGNOSIS — M170 Bilateral primary osteoarthritis of knee: Secondary | ICD-10-CM | POA: Diagnosis present

## 2024-04-19 DIAGNOSIS — I152 Hypertension secondary to endocrine disorders: Secondary | ICD-10-CM | POA: Diagnosis present

## 2024-04-19 DIAGNOSIS — N281 Cyst of kidney, acquired: Secondary | ICD-10-CM | POA: Diagnosis not present

## 2024-04-19 DIAGNOSIS — Z9842 Cataract extraction status, left eye: Secondary | ICD-10-CM

## 2024-04-19 DIAGNOSIS — Z95818 Presence of other cardiac implants and grafts: Secondary | ICD-10-CM

## 2024-04-19 DIAGNOSIS — Z9841 Cataract extraction status, right eye: Secondary | ICD-10-CM

## 2024-04-19 DIAGNOSIS — E1122 Type 2 diabetes mellitus with diabetic chronic kidney disease: Secondary | ICD-10-CM | POA: Diagnosis present

## 2024-04-19 DIAGNOSIS — I130 Hypertensive heart and chronic kidney disease with heart failure and stage 1 through stage 4 chronic kidney disease, or unspecified chronic kidney disease: Secondary | ICD-10-CM | POA: Diagnosis present

## 2024-04-19 DIAGNOSIS — Z85828 Personal history of other malignant neoplasm of skin: Secondary | ICD-10-CM

## 2024-04-19 DIAGNOSIS — Z87442 Personal history of urinary calculi: Secondary | ICD-10-CM

## 2024-04-19 DIAGNOSIS — Z8249 Family history of ischemic heart disease and other diseases of the circulatory system: Secondary | ICD-10-CM

## 2024-04-19 DIAGNOSIS — D509 Iron deficiency anemia, unspecified: Secondary | ICD-10-CM | POA: Diagnosis present

## 2024-04-19 LAB — I-STAT CHEM 8, ED
BUN: 39 mg/dL — ABNORMAL HIGH (ref 8–23)
Calcium, Ion: 1.44 mmol/L — ABNORMAL HIGH (ref 1.15–1.40)
Chloride: 110 mmol/L (ref 98–111)
Creatinine, Ser: 1.6 mg/dL — ABNORMAL HIGH (ref 0.44–1.00)
Glucose, Bld: 169 mg/dL — ABNORMAL HIGH (ref 70–99)
HCT: 16 % — ABNORMAL LOW (ref 36.0–46.0)
Hemoglobin: 5.4 g/dL — CL (ref 12.0–15.0)
Potassium: 4 mmol/L (ref 3.5–5.1)
Sodium: 140 mmol/L (ref 135–145)
TCO2: 18 mmol/L — ABNORMAL LOW (ref 22–32)

## 2024-04-19 LAB — CBC WITH DIFFERENTIAL/PLATELET
Abs Immature Granulocytes: 0.03 K/uL (ref 0.00–0.07)
Basophils Absolute: 0 K/uL (ref 0.0–0.1)
Basophils Relative: 0 %
Eosinophils Absolute: 0 K/uL (ref 0.0–0.5)
Eosinophils Relative: 1 %
HCT: 16.9 % — ABNORMAL LOW (ref 36.0–46.0)
Hemoglobin: 4.3 g/dL — CL (ref 12.0–15.0)
Immature Granulocytes: 1 %
Lymphocytes Relative: 20 %
Lymphs Abs: 1.1 K/uL (ref 0.7–4.0)
MCH: 21.9 pg — ABNORMAL LOW (ref 26.0–34.0)
MCHC: 25.4 g/dL — ABNORMAL LOW (ref 30.0–36.0)
MCV: 86.2 fL (ref 80.0–100.0)
Monocytes Absolute: 0.7 K/uL (ref 0.1–1.0)
Monocytes Relative: 12 %
Neutro Abs: 3.5 K/uL (ref 1.7–7.7)
Neutrophils Relative %: 66 %
Platelets: 301 K/uL (ref 150–400)
RBC: 1.96 MIL/uL — ABNORMAL LOW (ref 3.87–5.11)
RDW: 18.7 % — ABNORMAL HIGH (ref 11.5–15.5)
WBC: 5.2 K/uL (ref 4.0–10.5)
nRBC: 0.6 % — ABNORMAL HIGH (ref 0.0–0.2)

## 2024-04-19 LAB — COMPREHENSIVE METABOLIC PANEL WITH GFR
ALT: 9 U/L (ref 0–44)
AST: 12 U/L — ABNORMAL LOW (ref 15–41)
Albumin: 3.3 g/dL — ABNORMAL LOW (ref 3.5–5.0)
Alkaline Phosphatase: 72 U/L (ref 38–126)
Anion gap: 10 (ref 5–15)
BUN: 43 mg/dL — ABNORMAL HIGH (ref 8–23)
CO2: 18 mmol/L — ABNORMAL LOW (ref 22–32)
Calcium: 10.8 mg/dL — ABNORMAL HIGH (ref 8.9–10.3)
Chloride: 110 mmol/L (ref 98–111)
Creatinine, Ser: 1.48 mg/dL — ABNORMAL HIGH (ref 0.44–1.00)
GFR, Estimated: 35 mL/min — ABNORMAL LOW (ref >60)
Glucose, Bld: 172 mg/dL — ABNORMAL HIGH (ref 70–99)
Potassium: 4 mmol/L (ref 3.5–5.1)
Sodium: 138 mmol/L (ref 135–145)
Total Bilirubin: 0.5 mg/dL (ref 0.0–1.2)
Total Protein: 5.3 g/dL — ABNORMAL LOW (ref 6.5–8.1)

## 2024-04-19 LAB — POC OCCULT BLOOD, ED: Fecal Occult Bld: POSITIVE — AB

## 2024-04-19 LAB — PREPARE RBC (CROSSMATCH)

## 2024-04-19 MED ORDER — PANTOPRAZOLE SODIUM 40 MG IV SOLR
40.0000 mg | Freq: Once | INTRAVENOUS | Status: AC
  Administered 2024-04-19: 40 mg via INTRAVENOUS
  Filled 2024-04-19: qty 10

## 2024-04-19 MED ORDER — SODIUM CHLORIDE 0.9% IV SOLUTION: Freq: Once | INTRAVENOUS | Status: AC

## 2024-04-19 NOTE — ED Provider Triage Note (Signed)
 Emergency Medicine Provider Triage Evaluation Note  [name] [name] , a 83 y.o. female  was evaluated in triage.  Pt reports to the ED sent by her cardiologist for low hemoglobin of 4.6. She reports worsening shortness of breath worsening over the last 2 weeks. She endorses nausea, no vomiting. Generalized abdominal pain. She denies chest pain. No peripheral edema. She has a history of low iron requiring IV iron supplementation sometimes requiring blood transfusions.  Review of Systems  Positive:  Negative:   Physical Exam  BP (!) 102/45 (BP Location: Left Arm)   Pulse 79   Temp 98.1 F (36.7 C)   Resp 17   Ht 5' 3 (1.6 m)   Wt 87.5 kg   SpO2 100%   BMI 34.17 kg/m  Gen:   Awake, no distress   Resp:  Normal effort, lungs clear bilaterally MSK:   Moves extremities without difficulty  Other:  Jaundice on appearance and pale  Medical Decision Making  Medically screening exam initiated at 9:45 PM.  Appropriate orders placed.  [name] [name] was informed that the remainder of the evaluation will be completed by another provider, this initial triage assessment does not replace that evaluation, and the importance of remaining in the ED until their evaluation is complete.    [name] [name], [place] 04/19/24 [id number]

## 2024-04-19 NOTE — ED Notes (Signed)
Patient signed consent form for blood transfusion . 

## 2024-04-19 NOTE — ED Triage Notes (Signed)
 The pt went to her doctor earlier today and had blood drawn  she was called tonight and was told that she had a low hgb and needed a transfusion  her hgb was 4.9  she has weakness and some sob and some chest pain  she has had this same difficulty in the past

## 2024-04-19 NOTE — ED Provider Notes (Signed)
 [place] EMERGENCY DEPARTMENT AT [facility] Provider Note   CSN: [id number] Arrival date & time: 04/19/24  2124     Patient presents with: low hgb   [name] [name] is a 83 y.o. female.  Patient with past medical history significant for type II DM, atrial fibrillation, history of pulmonary embolism on chronic anticoagulation with Eliquis , hypertension, stage IIIa CKD, Watchman device, chronic combined systolic and diastolic CHF presents to the emergency room complaining of shortness of breath and abnormally low hemoglobin.  She states she had an appointment with cardiology earlier today due to concerns about significant increase in shortness of breath over the past few days.  She tells me that she has a history of multiple transfusions in the past that she has on Eliquis  and occasionally will have a GI bleed.  Hemoglobin drawn at cardiology was critically low with a value of 4.6.  The patient was sent to the emergency department for transfusion and further evaluation.  She endorses pale skin and shortness of breath.  She denies chest pain, abdominal pain, nausea, vomiting.  She states she has chronic constipation but has noticed no dark stools, no obvious blood in her stool.   HPI     Prior to Admission medications   Medication Sig Start Date End Date Taking? Authorizing Provider  acetaminophen  (TYLENOL ) 325 MG tablet Take 2 tablets (650 mg total) by mouth every 6 (six) hours as needed for mild pain or headache (or Fever >/= 101). 04/04/19   Pearlean Manus, MD  allopurinol  (ZYLOPRIM ) 100 MG tablet Take 1 tablet by mouth once daily 03/29/24   Joesph Annabella HERO, FNP  apixaban  (ELIQUIS ) 5 MG TABS tablet Take 1 tablet (5 mg total) by mouth 2 (two) times daily. 02/14/24   Joesph Annabella HERO, FNP  atorvastatin  (LIPITOR) 20 MG tablet Take 1 tablet (20 mg total) by mouth daily. 11/24/23   Joesph Annabella HERO, FNP  cetirizine  (ZYRTEC ) 10 MG tablet Take 10 mg by mouth daily.    [provider]  dapagliflozin  propanediol (FARXIGA ) 10 MG TABS tablet Take 1 tablet (10 mg total) by mouth daily. 09/22/23   Joesph Annabella HERO, FNP  ferrous sulfate  325 (65 FE) MG EC tablet Take 325 mg by mouth daily.    [provider]  furosemide  (LASIX ) 20 MG tablet Take 1 tablet (20 mg total) by mouth daily as needed for fluid or edema. 01/19/24   Cheryle Page, MD  GNP CRANBERRY EXTRACT PO Take 1 tablet by mouth in the morning.    [provider]  levothyroxine  (SYNTHROID ) 50 MCG tablet Take 1 tablet (50 mcg total) by mouth daily before breakfast. 11/24/23   Joesph Annabella HERO, FNP  metoprolol  succinate (TOPROL -XL) 50 MG 24 hr tablet Take 3 tablets (150 mg total) by mouth daily. 02/16/24   Joesph Annabella HERO, FNP  oxybutynin  (DITROPAN -XL) 10 MG 24 hr tablet Take 1 tablet (10 mg total) by mouth at bedtime. 04/05/24   Joesph Annabella HERO, FNP  pantoprazole  (PROTONIX ) 40 MG tablet Take 1 tablet (40 mg total) by mouth 2 (two) times daily before a meal. 11/24/23   Joesph Annabella HERO, FNP  polyethylene glycol powder (GLYCOLAX /MIRALAX ) 17 GM/SCOOP powder Take 1 capful (17 g) by mouth daily as needed for moderate constipation. 01/19/24   Cheryle Page, MD    Allergies: Patient has no known allergies.    Review of Systems  Updated Vital Signs BP (!) 105/55   Pulse 97   Temp 98.1 F (  36.7 C) (Oral)   Resp 19   Ht 5' 3 (1.6 m)   Wt 87.5 kg   SpO2 100%   BMI 34.17 kg/m   Physical Exam Vitals and nursing note reviewed.  Constitutional:      General: She is not in acute distress.    Appearance: She is well-developed.  HENT:     Head: Normocephalic and atraumatic.  Eyes:     Conjunctiva/sclera: Conjunctivae normal.  Cardiovascular:     Rate and Rhythm: Normal rate and regular rhythm.     Heart sounds: No murmur heard. Pulmonary:     Effort: Pulmonary effort is normal. No respiratory distress.     Breath sounds: Normal breath sounds.  Abdominal:     Palpations: Abdomen is  soft.     Tenderness: There is no abdominal tenderness.  Genitourinary:    Rectum: Guaiac result positive.  Musculoskeletal:        General: No swelling.     Cervical back: Neck supple.     Right lower leg: No edema.     Left lower leg: No edema.  Skin:    General: Skin is warm and dry.     Capillary Refill: Capillary refill takes less than 2 seconds.     Coloration: Skin is pale.  Neurological:     Mental Status: She is alert.  Psychiatric:        Mood and Affect: Mood normal.     (all labs ordered are listed, but only abnormal results are displayed) Labs Reviewed  CBC WITH DIFFERENTIAL/PLATELET - Abnormal; Notable for the following components:      Result Value   RBC 1.96 (*)    Hemoglobin 4.3 (*)    HCT 16.9 (*)    MCH 21.9 (*)    MCHC 25.4 (*)    RDW 18.7 (*)    nRBC 0.6 (*)    All other components within normal limits  COMPREHENSIVE METABOLIC PANEL WITH GFR - Abnormal; Notable for the following components:   CO2 18 (*)    Glucose, Bld 172 (*)    BUN 43 (*)    Creatinine, Ser 1.48 (*)    Calcium  10.8 (*)    Total Protein 5.3 (*)    Albumin  3.3 (*)    AST 12 (*)    GFR, Estimated 35 (*)    All other components within normal limits  I-STAT CHEM 8, ED - Abnormal; Notable for the following components:   BUN 39 (*)    Creatinine, Ser 1.60 (*)    Glucose, Bld 169 (*)    Calcium , Ion 1.44 (*)    TCO2 18 (*)    Hemoglobin 5.4 (*)    HCT 16.0 (*)    All other components within normal limits  POC OCCULT BLOOD, ED - Abnormal; Notable for the following components:   Fecal Occult Bld POSITIVE (*)    All other components within normal limits  TYPE AND SCREEN  PREPARE RBC (CROSSMATCH)  PREPARE RBC (CROSSMATCH)    EKG: None  Radiology: DG Chest Portable 1 View Result Date: 04/19/2024 EXAM: 1 VIEW(S) XRAY OF THE CHEST 04/19/2024 09:53:51 PM COMPARISON: Comparison with radiograph of 04/17/2024. CLINICAL HISTORY: SHOB. FINDINGS: LUNGS AND PLEURA: No focal pulmonary  opacity. No pulmonary edema. No pleural effusion. No pneumothorax. HEART AND MEDIASTINUM: Left atrial appendage closure device. Stable enlargement of the cardiomediastinal silhouette. Aortic atherosclerotic calcification. BONES AND SOFT TISSUES: No acute osseous abnormality. IMPRESSION: 1. No acute cardiopulmonary abnormality. Electronically signed  by: Norman Gatlin MD 04/19/2024 10:01 PM EDT RP Workstation: [id number]     .Critical Care  Performed by: Logan Ubaldo NOVAK, PA-C Authorized by: Logan Ubaldo NOVAK, PA-C   Critical care provider statement:    Critical care time (minutes):  30   Critical care time was exclusive of:  Separately billable procedures and treating other patients   Critical care was necessary to treat or prevent imminent or life-threatening deterioration of the following conditions:  Circulatory failure (Symptomatic anemia, hemoglobin 4.3)   Critical care was time spent personally by me on the following activities:  Development of treatment plan with patient or surrogate, discussions with consultants, evaluation of patient's response to treatment, examination of patient, ordering and review of laboratory studies, ordering and review of radiographic studies, ordering and performing treatments and interventions, pulse oximetry, re-evaluation of patient's condition and review of old charts   Care discussed with: admitting provider      Medications Ordered in the ED  0.9 %  sodium chloride  infusion (Manually program via Guardrails IV Fluids) ( Intravenous Not Given 04/20/24 0112)  0.9 %  sodium chloride  infusion (Manually program via Guardrails IV Fluids) (0 mLs Intravenous Hold 04/20/24 0111)  pantoprazole  (PROTONIX ) injection 40 mg (40 mg Intravenous Given 04/19/24 2350)                                    Medical Decision Making Amount and/or Complexity of Data Reviewed Labs: ordered.  Risk Prescription drug management. Decision regarding hospitalization.   This  patient presents to the ED for concern of low hemoglobin, this involves an extensive number of treatment options, and is a complaint that carries with it a high risk of complications and morbidity.  The differential diagnosis includes GI bleed, other bleed, anemia of chronic disease, iron deficiency anemia, others   Co morbidities / Chronic conditions that complicate the patient evaluation  Chronic Eliquis  usage   Additional history obtained:  Additional history obtained from EMR External records from outside source obtained and reviewed including cardiology notes   Lab Tests:  I Ordered, and personally interpreted labs.  The pertinent results include: Hemoglobin 4.3, positive fecal occult blood test   Imaging Studies ordered:  I ordered imaging studies including chest x-ray I independently visualized and interpreted imaging which showed unremarkable chest x-ray I agree with the radiologist interpretation   Cardiac Monitoring: / EKG:  The patient was maintained on a cardiac monitor.  I personally viewed and interpreted the cardiac monitored which showed an underlying rhythm of: Atrial fibrillation   Problem List / ED Course / Critical interventions / Medication management   I ordered medication including PRBC, Protonix  Reevaluation of the patient after these medicines showed that the patient stayed the same I have reviewed the patients home medicines and have made adjustments as needed   Consultations Obtained:  I requested consultation with the hospitalist, Dr. Lee and discussed lab and imaging findings as well as pertinent plan - they recommend: admission  I sent a secure chat to Dr. Legrand with gastroenterology to have patient added to morning rounding list.   Test / Admission - Considered:  Patient with significant symptomatic anemia with hemoglobin of 4.3.  2 months ago the hemoglobin was 11.3.  Patient has a positive fecal occult blood test.  Patient currently  getting 2 units of PRBC.  Patient would benefit from admission for serial hemoglobin checks, possible further transfusion, and  GI consultation.      Final diagnoses:  Symptomatic anemia  Positive fecal occult blood test    ED Discharge Orders     None          Logan Ubaldo KATHEE DEVONNA 04/20/24 0114    Theadore Ozell HERO, MD 04/20/24 [phone]

## 2024-04-19 NOTE — Telephone Encounter (Signed)
 [facility] called urgent lab results.  Hb 4.6, HCT 17.7 Labs were drawn after Cardiology visit today due to fatigue.  Patient reports she is taking eliquis , but not actively bleeding.  Instructed her to immediately go to the ER- at [facility]- admit for IV blood transfusion and further work up. She will have family drive her to the ER for admission.

## 2024-04-19 NOTE — Patient Instructions (Addendum)
 Medication Instructions:  Your physician recommends that you continue on your current medications as directed. Please refer to the Current Medication list given to you today. *If you need a refill on your cardiac medications before your next appointment, please call your pharmacy*  Lab Work: TODAY-BNP & CBC If you have labs (blood work) drawn today and your tests are completely normal, you will receive your results only by: MyChart Message (if you have MyChart) OR A paper copy in the mail If you have any lab test that is abnormal or we need to change your treatment, we will call you to review the results.  Testing/Procedures: GEOFFRY HEWS- Long Term Monitor Instructions  Your physician has requested you wear a ZIO patch monitor for 3 days.  This is a single patch monitor. [employer] supplies one patch monitor per enrollment. Additional stickers are not available. Please do not apply patch if you will be having a Nuclear Stress Test,  Echocardiogram, Cardiac CT, MRI, or Chest Xray during the period you would be wearing the  monitor. The patch cannot be worn during these tests. You cannot remove and re-apply the  ZIO XT patch monitor.  Your ZIO patch monitor will be mailed 3 day USPS to your address on file. It may take 3-5 days  to receive your monitor after you have been enrolled.  Once you have received your monitor, please review the enclosed instructions. Your monitor  has already been registered assigning a specific monitor serial # to you.  Billing and Patient Assistance Program Information  We have supplied [employer] with any of your insurance information on file for billing purposes. [employer] offers a sliding scale Patient Assistance Program for patients that do not have  insurance, or whose insurance does not completely cover the cost of the ZIO monitor.  You must apply for the Patient Assistance Program to qualify for this discounted rate.  To apply, please call [employer] at [phone],  select option 4, select option 2, ask to apply for  Patient Assistance Program. Meredeth will ask your household income, and how many people  are in your household. They will quote your out-of-pocket cost based on that information.  [employer] will also be able to set up a 62-month, interest-free payment plan if needed.  Applying the monitor   Shave hair from upper left chest.  Hold abrader disc by orange tab. Rub abrader in 40 strokes over the upper left chest as  indicated in your monitor instructions.  Clean area with 4 enclosed alcohol  pads. Let dry.  Apply patch as indicated in monitor instructions. Patch will be placed under collarbone on left  side of chest with arrow pointing upward.  Rub patch adhesive wings for 2 minutes. Remove white label marked 1. Remove the white  label marked 2. Rub patch adhesive wings for 2 additional minutes.  While looking in a mirror, press and release button in center of patch. A small green light will  flash 3-4 times. This will be your only indicator that the monitor has been turned on.  Do not shower for the first 24 hours. You may shower after the first 24 hours.  Press the button if you feel a symptom. You will hear a small click. Record Date, Time and  Symptom in the Patient Logbook.  When you are ready to remove the patch, follow instructions on the last 2 pages of Patient  Logbook. Stick patch monitor onto the last page of Patient Logbook.  Place Patient Logbook in the  blue and white box. Use locking tab on box and tape box closed  securely. The blue and white box has prepaid postage on it. Please place it in the mailbox as  soon as possible. Your physician should have your test results approximately 7 days after the  monitor has been mailed back to [facility].  Call [facility] Customer Care at [phone] if you have questions regarding  your ZIO XT patch monitor. Call them immediately if you see an orange light blinking on your   monitor.  If your monitor falls off in less than 4 days, contact our Monitor department at [phone].  If your monitor becomes loose or falls off after 4 days call [employer] at [phone] for  suggestions on securing your monitor   Follow-Up: At [facility], you and your health needs are our priority.  As part of our continuing mission to provide you with exceptional heart care, our providers are all part of one team.  This team includes your primary Cardiologist (physician) and Advanced Practice Providers or APPs (Physician Assistants and Nurse Practitioners) who all work together to provide you with the care you need, when you need it.  Your next appointment:   FOLLOW UP AS SCHEDULED   Provider:   Lynwood Schilling, MD    We recommend signing up for the patient portal called MyChart.  Sign up information is provided on this After Visit Summary.  MyChart is used to connect with patients for Virtual Visits (Telemedicine).  Patients are able to view lab/test results, encounter notes, upcoming appointments, etc.  Non-urgent messages can be sent to your provider as well.   To learn more about what you can do with MyChart, go to [email or web address].   Other Instructions

## 2024-04-19 NOTE — ED Provider Notes (Incomplete)
 [place] EMERGENCY DEPARTMENT AT [facility] Provider Note   CSN: [id number] Arrival date & time: 04/19/24  2124     Patient presents with: low hgb   [name] [name] is a 83 y.o. female.  Patient with past medical history significant for type II DM, atrial fibrillation, history of pulmonary embolism on chronic anticoagulation with Eliquis , hypertension, stage IIIa CKD, Watchman device, chronic combined systolic and diastolic CHF presents to the emergency room complaining of shortness of breath and abnormally low hemoglobin.  She states she had an appointment with cardiology earlier today due to concerns about significant increase in shortness of breath over the past few days.  She tells me that she has a history of multiple transfusions in the past that she has on Eliquis  and occasionally will have a GI bleed.  Hemoglobin drawn at cardiology was critically low with a value of 4.6.  The patient was sent to the emergency department for transfusion and further evaluation.  She endorses pale skin and shortness of breath.  She denies chest pain, abdominal pain, nausea, vomiting.  She states she has chronic constipation but has noticed no dark stools, no obvious blood in her stool.  {Add pertinent medical, surgical, social history, OB history to HPI:32947} HPI     Prior to Admission medications   Medication Sig Start Date End Date Taking? Authorizing Provider  acetaminophen  (TYLENOL ) 325 MG tablet Take 2 tablets (650 mg total) by mouth every 6 (six) hours as needed for mild pain or headache (or Fever >/= 101). 04/04/19   Pearlean Manus, MD  allopurinol  (ZYLOPRIM ) 100 MG tablet Take 1 tablet by mouth once daily 03/29/24   Joesph Annabella HERO, FNP  apixaban  (ELIQUIS ) 5 MG TABS tablet Take 1 tablet (5 mg total) by mouth 2 (two) times daily. 02/14/24   Joesph Annabella HERO, FNP  atorvastatin  (LIPITOR) 20 MG tablet Take 1 tablet (20 mg total) by mouth daily. 11/24/23   Joesph Annabella HERO, FNP   cetirizine  (ZYRTEC ) 10 MG tablet Take 10 mg by mouth daily.    [provider]  dapagliflozin  propanediol (FARXIGA ) 10 MG TABS tablet Take 1 tablet (10 mg total) by mouth daily. 09/22/23   Joesph Annabella HERO, FNP  ferrous sulfate  325 (65 FE) MG EC tablet Take 325 mg by mouth daily.    [provider]  furosemide  (LASIX ) 20 MG tablet Take 1 tablet (20 mg total) by mouth daily as needed for fluid or edema. 01/19/24   Cheryle Page, MD  GNP CRANBERRY EXTRACT PO Take 1 tablet by mouth in the morning.    [provider]  levothyroxine  (SYNTHROID ) 50 MCG tablet Take 1 tablet (50 mcg total) by mouth daily before breakfast. 11/24/23   Joesph Annabella HERO, FNP  metoprolol  succinate (TOPROL -XL) 50 MG 24 hr tablet Take 3 tablets (150 mg total) by mouth daily. 02/16/24   Joesph Annabella HERO, FNP  oxybutynin  (DITROPAN -XL) 10 MG 24 hr tablet Take 1 tablet (10 mg total) by mouth at bedtime. 04/05/24   Joesph Annabella HERO, FNP  pantoprazole  (PROTONIX ) 40 MG tablet Take 1 tablet (40 mg total) by mouth 2 (two) times daily before a meal. 11/24/23   Joesph Annabella HERO, FNP  polyethylene glycol powder (GLYCOLAX /MIRALAX ) 17 GM/SCOOP powder Take 1 capful (17 g) by mouth daily as needed for moderate constipation. 01/19/24   Cheryle Page, MD    Allergies: Patient has no known allergies.    Review of Systems  Updated Vital Signs BP (!) 103/50  Pulse 95   Temp 98.1 F (36.7 C)   Resp (!) 22   Ht 5' 3 (1.6 m)   Wt 87.5 kg   SpO2 96%   BMI 34.17 kg/m   Physical Exam Vitals and nursing note reviewed.  Constitutional:      General: She is not in acute distress.    Appearance: She is well-developed.  HENT:     Head: Normocephalic and atraumatic.  Eyes:     Conjunctiva/sclera: Conjunctivae normal.  Cardiovascular:     Rate and Rhythm: Normal rate and regular rhythm.     Heart sounds: No murmur heard. Pulmonary:     Effort: Pulmonary effort is normal. No respiratory distress.     Breath  sounds: Normal breath sounds.  Abdominal:     Palpations: Abdomen is soft.     Tenderness: There is no abdominal tenderness.  Genitourinary:    Rectum: Guaiac result positive.  Musculoskeletal:        General: No swelling.     Cervical back: Neck supple.  Skin:    General: Skin is warm and dry.     Capillary Refill: Capillary refill takes less than 2 seconds.  Neurological:     Mental Status: She is alert.  Psychiatric:        Mood and Affect: Mood normal.     (all labs ordered are listed, but only abnormal results are displayed) Labs Reviewed  COMPREHENSIVE METABOLIC PANEL WITH GFR - Abnormal; Notable for the following components:      Result Value   CO2 18 (*)    Glucose, Bld 172 (*)    BUN 43 (*)    Creatinine, Ser 1.48 (*)    Calcium  10.8 (*)    Total Protein 5.3 (*)    Albumin  3.3 (*)    AST 12 (*)    GFR, Estimated 35 (*)    All other components within normal limits  I-STAT CHEM 8, ED - Abnormal; Notable for the following components:   BUN 39 (*)    Creatinine, Ser 1.60 (*)    Glucose, Bld 169 (*)    Calcium , Ion 1.44 (*)    TCO2 18 (*)    Hemoglobin 5.4 (*)    HCT 16.0 (*)    All other components within normal limits  POC OCCULT BLOOD, ED - Abnormal; Notable for the following components:   Fecal Occult Bld POSITIVE (*)    All other components within normal limits  CBC WITH DIFFERENTIAL/PLATELET  TYPE AND SCREEN  PREPARE RBC (CROSSMATCH)    EKG: None  Radiology: DG Chest Portable 1 View Result Date: 04/19/2024 EXAM: 1 VIEW(S) XRAY OF THE CHEST 04/19/2024 09:53:51 PM COMPARISON: Comparison with radiograph of 04/17/2024. CLINICAL HISTORY: SHOB. FINDINGS: LUNGS AND PLEURA: No focal pulmonary opacity. No pulmonary edema. No pleural effusion. No pneumothorax. HEART AND MEDIASTINUM: Left atrial appendage closure device. Stable enlargement of the cardiomediastinal silhouette. Aortic atherosclerotic calcification. BONES AND SOFT TISSUES: No acute osseous  abnormality. IMPRESSION: 1. No acute cardiopulmonary abnormality. Electronically signed by: Norman Gatlin MD 04/19/2024 10:01 PM EDT RP Workstation: [id number]    {Document cardiac monitor, telemetry assessment procedure when appropriate:32947} Procedures   Medications Ordered in the ED  0.9 %  sodium chloride  infusion (Manually program via Guardrails IV Fluids) (has no administration in time range)      {Click here for ABCD2, HEART and other calculators REFRESH Note before signing:1}  Medical Decision Making Amount and/or Complexity of Data Reviewed Labs: ordered.  Risk Prescription drug management.   ***  {Document critical care time when appropriate  Document review of labs and clinical decision tools ie CHADS2VASC2, etc  Document your independent review of radiology images and any outside records  Document your discussion with family members, caretakers and with consultants  Document social determinants of health affecting pt's care  Document your decision making why or why not admission, treatments were needed:32947:::1}   Final diagnoses:  None    ED Discharge Orders     None

## 2024-04-19 NOTE — Progress Notes (Unsigned)
Enrolled patient for a 3 day Zio XT monitor to be mailed to patients home  Hochrein to read

## 2024-04-20 ENCOUNTER — Ambulatory Visit: Payer: Self-pay | Admitting: Cardiology

## 2024-04-20 ENCOUNTER — Encounter (HOSPITAL_COMMUNITY): Payer: Self-pay | Admitting: Internal Medicine

## 2024-04-20 ENCOUNTER — Encounter: Payer: Self-pay | Admitting: Cardiology

## 2024-04-20 DIAGNOSIS — E039 Hypothyroidism, unspecified: Secondary | ICD-10-CM | POA: Diagnosis not present

## 2024-04-20 DIAGNOSIS — I4819 Other persistent atrial fibrillation: Secondary | ICD-10-CM | POA: Diagnosis not present

## 2024-04-20 DIAGNOSIS — N1831 Chronic kidney disease, stage 3a: Secondary | ICD-10-CM | POA: Diagnosis not present

## 2024-04-20 DIAGNOSIS — D649 Anemia, unspecified: Principal | ICD-10-CM

## 2024-04-20 DIAGNOSIS — Z79890 Hormone replacement therapy (postmenopausal): Secondary | ICD-10-CM | POA: Diagnosis not present

## 2024-04-20 DIAGNOSIS — I5020 Unspecified systolic (congestive) heart failure: Secondary | ICD-10-CM

## 2024-04-20 DIAGNOSIS — Z6834 Body mass index (BMI) 34.0-34.9, adult: Secondary | ICD-10-CM | POA: Diagnosis not present

## 2024-04-20 DIAGNOSIS — I10 Essential (primary) hypertension: Secondary | ICD-10-CM

## 2024-04-20 DIAGNOSIS — E1122 Type 2 diabetes mellitus with diabetic chronic kidney disease: Secondary | ICD-10-CM | POA: Diagnosis not present

## 2024-04-20 DIAGNOSIS — D500 Iron deficiency anemia secondary to blood loss (chronic): Secondary | ICD-10-CM | POA: Diagnosis not present

## 2024-04-20 DIAGNOSIS — Z95818 Presence of other cardiac implants and grafts: Secondary | ICD-10-CM | POA: Diagnosis not present

## 2024-04-20 DIAGNOSIS — M170 Bilateral primary osteoarthritis of knee: Secondary | ICD-10-CM | POA: Diagnosis not present

## 2024-04-20 DIAGNOSIS — R0602 Shortness of breath: Secondary | ICD-10-CM | POA: Diagnosis present

## 2024-04-20 DIAGNOSIS — M19041 Primary osteoarthritis, right hand: Secondary | ICD-10-CM | POA: Diagnosis not present

## 2024-04-20 DIAGNOSIS — D508 Other iron deficiency anemias: Secondary | ICD-10-CM

## 2024-04-20 DIAGNOSIS — E66811 Obesity, class 1: Secondary | ICD-10-CM | POA: Diagnosis not present

## 2024-04-20 DIAGNOSIS — M19042 Primary osteoarthritis, left hand: Secondary | ICD-10-CM | POA: Diagnosis not present

## 2024-04-20 DIAGNOSIS — K449 Diaphragmatic hernia without obstruction or gangrene: Secondary | ICD-10-CM | POA: Diagnosis not present

## 2024-04-20 DIAGNOSIS — D6832 Hemorrhagic disorder due to extrinsic circulating anticoagulants: Secondary | ICD-10-CM | POA: Diagnosis not present

## 2024-04-20 DIAGNOSIS — K31811 Angiodysplasia of stomach and duodenum with bleeding: Secondary | ICD-10-CM | POA: Diagnosis not present

## 2024-04-20 DIAGNOSIS — Z86711 Personal history of pulmonary embolism: Secondary | ICD-10-CM | POA: Diagnosis not present

## 2024-04-20 DIAGNOSIS — K5909 Other constipation: Secondary | ICD-10-CM | POA: Diagnosis not present

## 2024-04-20 DIAGNOSIS — T45515A Adverse effect of anticoagulants, initial encounter: Secondary | ICD-10-CM | POA: Diagnosis not present

## 2024-04-20 DIAGNOSIS — K31819 Angiodysplasia of stomach and duodenum without bleeding: Secondary | ICD-10-CM | POA: Diagnosis not present

## 2024-04-20 DIAGNOSIS — K5730 Diverticulosis of large intestine without perforation or abscess without bleeding: Secondary | ICD-10-CM | POA: Diagnosis not present

## 2024-04-20 DIAGNOSIS — Z7901 Long term (current) use of anticoagulants: Secondary | ICD-10-CM | POA: Diagnosis not present

## 2024-04-20 DIAGNOSIS — R195 Other fecal abnormalities: Secondary | ICD-10-CM

## 2024-04-20 DIAGNOSIS — I5042 Chronic combined systolic (congestive) and diastolic (congestive) heart failure: Secondary | ICD-10-CM | POA: Diagnosis not present

## 2024-04-20 DIAGNOSIS — I130 Hypertensive heart and chronic kidney disease with heart failure and stage 1 through stage 4 chronic kidney disease, or unspecified chronic kidney disease: Secondary | ICD-10-CM | POA: Diagnosis not present

## 2024-04-20 DIAGNOSIS — K922 Gastrointestinal hemorrhage, unspecified: Secondary | ICD-10-CM | POA: Diagnosis present

## 2024-04-20 DIAGNOSIS — I132 Hypertensive heart and chronic kidney disease with heart failure and with stage 5 chronic kidney disease, or end stage renal disease: Secondary | ICD-10-CM | POA: Diagnosis not present

## 2024-04-20 DIAGNOSIS — K219 Gastro-esophageal reflux disease without esophagitis: Secondary | ICD-10-CM | POA: Diagnosis not present

## 2024-04-20 DIAGNOSIS — Z86718 Personal history of other venous thrombosis and embolism: Secondary | ICD-10-CM | POA: Diagnosis not present

## 2024-04-20 LAB — CBC
HCT: 26.9 % — ABNORMAL LOW (ref 36.0–46.0)
HCT: 26.9 % — ABNORMAL LOW (ref 36.0–46.0)
HCT: 28.2 % — ABNORMAL LOW (ref 36.0–46.0)
Hematocrit: 17.7 % — CL (ref 34.0–46.6)
Hemoglobin: 4.6 g/dL — CL (ref 11.1–15.9)
Hemoglobin: 8 g/dL — ABNORMAL LOW (ref 12.0–15.0)
Hemoglobin: 8.1 g/dL — ABNORMAL LOW (ref 12.0–15.0)
Hemoglobin: 8.3 g/dL — ABNORMAL LOW (ref 12.0–15.0)
MCH: 21.8 pg — ABNORMAL LOW (ref 26.6–33.0)
MCH: 26.1 pg (ref 26.0–34.0)
MCH: 26.4 pg (ref 26.0–34.0)
MCH: 25.8 pg — ABNORMAL LOW (ref 26.0–34.0)
MCHC: 26 g/dL — ABNORMAL LOW (ref 31.5–35.7)
MCHC: 29.7 g/dL — ABNORMAL LOW (ref 30.0–36.0)
MCHC: 30.1 g/dL (ref 30.0–36.0)
MCHC: 29.4 g/dL — ABNORMAL LOW (ref 30.0–36.0)
MCV: 84 fL (ref 79–97)
MCV: 87.6 fL (ref 80.0–100.0)
MCV: 87.6 fL (ref 80.0–100.0)
MCV: 87.6 fL (ref 80.0–100.0)
NRBC: 1 % — ABNORMAL HIGH (ref 0–0)
Platelets: 332 x10E3/uL (ref 150–450)
Platelets: 265 K/uL (ref 150–400)
Platelets: 268 K/uL (ref 150–400)
Platelets: 296 K/uL (ref 150–400)
RBC: 2.11 x10E6/uL — CL (ref 3.77–5.28)
RBC: 3.07 MIL/uL — ABNORMAL LOW (ref 3.87–5.11)
RBC: 3.07 MIL/uL — ABNORMAL LOW (ref 3.87–5.11)
RBC: 3.22 MIL/uL — ABNORMAL LOW (ref 3.87–5.11)
RDW: 17.9 % — ABNORMAL HIGH (ref 11.7–15.4)
RDW: 16.9 % — ABNORMAL HIGH (ref 11.5–15.5)
RDW: 17 % — ABNORMAL HIGH (ref 11.5–15.5)
RDW: 16.9 % — ABNORMAL HIGH (ref 11.5–15.5)
WBC: 5.5 x10E3/uL (ref 3.4–10.8)
WBC: 5.7 K/uL (ref 4.0–10.5)
WBC: 5.9 K/uL (ref 4.0–10.5)
WBC: 6.2 K/uL (ref 4.0–10.5)
nRBC: 0.4 % — ABNORMAL HIGH (ref 0.0–0.2)
nRBC: 0.3 % — ABNORMAL HIGH (ref 0.0–0.2)
nRBC: 0.3 % — ABNORMAL HIGH (ref 0.0–0.2)

## 2024-04-20 LAB — RETICULOCYTES
Immature Retic Fract: 8.5 % (ref 2.3–15.9)
RBC.: 2.95 MIL/uL — ABNORMAL LOW (ref 3.87–5.11)
Retic Count, Absolute: 62 K/uL (ref 19.0–186.0)
Retic Ct Pct: 2.1 % (ref 0.4–3.1)

## 2024-04-20 LAB — COMPREHENSIVE METABOLIC PANEL WITH GFR
ALT: 8 U/L (ref 0–44)
AST: 14 U/L — ABNORMAL LOW (ref 15–41)
Albumin: 3.2 g/dL — ABNORMAL LOW (ref 3.5–5.0)
Alkaline Phosphatase: 67 U/L (ref 38–126)
Anion gap: 11 (ref 5–15)
BUN: 36 mg/dL — ABNORMAL HIGH (ref 8–23)
CO2: 20 mmol/L — ABNORMAL LOW (ref 22–32)
Calcium: 10.7 mg/dL — ABNORMAL HIGH (ref 8.9–10.3)
Chloride: 109 mmol/L (ref 98–111)
Creatinine, Ser: 1.4 mg/dL — ABNORMAL HIGH (ref 0.44–1.00)
GFR, Estimated: 38 mL/min — ABNORMAL LOW (ref >60)
Glucose, Bld: 105 mg/dL — ABNORMAL HIGH (ref 70–99)
Potassium: 3.5 mmol/L (ref 3.5–5.1)
Sodium: 140 mmol/L (ref 135–145)
Total Bilirubin: 1.4 mg/dL — ABNORMAL HIGH (ref 0.0–1.2)
Total Protein: 5.2 g/dL — ABNORMAL LOW (ref 6.5–8.1)

## 2024-04-20 LAB — FERRITIN: Ferritin: 14 ng/mL (ref 11–307)

## 2024-04-20 LAB — PRO B NATRIURETIC PEPTIDE: NT-Pro BNP: 3865 pg/mL — ABNORMAL HIGH (ref 0–738)

## 2024-04-20 LAB — IRON AND TIBC
Iron: <10 ug/dL — ABNORMAL LOW (ref 28–170)
TIBC: 412 ug/dL (ref 250–450)

## 2024-04-20 LAB — PREPARE RBC (CROSSMATCH)

## 2024-04-20 LAB — GLUCOSE, CAPILLARY: Glucose-Capillary: 125 mg/dL — ABNORMAL HIGH (ref 70–99)

## 2024-04-20 MED ORDER — ACETAMINOPHEN 325 MG PO TABS
650.0000 mg | ORAL_TABLET | Freq: Four times a day (QID) | ORAL | Status: DC | PRN
  Administered 2024-04-21 (×2): 650 mg via ORAL
  Filled 2024-04-20 (×2): qty 2

## 2024-04-20 MED ORDER — ONDANSETRON HCL 4 MG PO TABS: 4.0000 mg | ORAL_TABLET | Freq: Four times a day (QID) | ORAL | Status: DC | PRN

## 2024-04-20 MED ORDER — ACETAMINOPHEN 650 MG RE SUPP: 650.0000 mg | Freq: Four times a day (QID) | RECTAL | Status: DC | PRN

## 2024-04-20 MED ORDER — SODIUM CHLORIDE 0.9% FLUSH: 3.0000 mL | INTRAVENOUS | Status: DC | PRN

## 2024-04-20 MED ORDER — SODIUM CHLORIDE 0.9% FLUSH
3.0000 mL | Freq: Two times a day (BID) | INTRAVENOUS | Status: DC
  Administered 2024-04-20 – 2024-04-21 (×3): 3 mL via INTRAVENOUS

## 2024-04-20 MED ORDER — METOPROLOL SUCCINATE ER 25 MG PO TB24
50.0000 mg | ORAL_TABLET | Freq: Every day | ORAL | Status: DC
Start: 2024-04-20 — End: 2024-04-20

## 2024-04-20 MED ORDER — PANTOPRAZOLE SODIUM 40 MG IV SOLR
40.0000 mg | Freq: Two times a day (BID) | INTRAVENOUS | Status: DC
  Administered 2024-04-20 – 2024-04-21 (×3): 40 mg via INTRAVENOUS
  Filled 2024-04-20 (×4): qty 10

## 2024-04-20 MED ORDER — DEXTROSE IN LACTATED RINGERS 5 % IV SOLN
INTRAVENOUS | Status: DC
Start: 2024-04-20 — End: 2024-04-20

## 2024-04-20 MED ORDER — ONDANSETRON HCL 4 MG/2ML IJ SOLN: 4.0000 mg | Freq: Four times a day (QID) | INTRAMUSCULAR | Status: DC | PRN

## 2024-04-20 MED ORDER — METOPROLOL SUCCINATE ER 50 MG PO TB24
150.0000 mg | ORAL_TABLET | Freq: Every day | ORAL | Status: DC
Start: 2024-04-20 — End: 2024-04-22
  Administered 2024-04-20: 150 mg via ORAL
  Filled 2024-04-20: qty 6

## 2024-04-20 MED ORDER — LEVOTHYROXINE SODIUM 50 MCG PO TABS
50.0000 ug | ORAL_TABLET | Freq: Every day | ORAL | Status: DC
  Administered 2024-04-20 – 2024-04-22 (×3): 50 ug via ORAL
  Filled 2024-04-20 (×2): qty 1
  Filled 2024-04-20: qty 2

## 2024-04-20 MED ORDER — ATORVASTATIN CALCIUM 10 MG PO TABS: 20.0000 mg | ORAL_TABLET | Freq: Every day | ORAL | Status: DC

## 2024-04-20 MED ORDER — SODIUM CHLORIDE 0.9% IV SOLUTION: Freq: Once | INTRAVENOUS | Status: DC

## 2024-04-20 MED ORDER — SODIUM CHLORIDE 0.9 % IV SOLN: 250.0000 mL | INTRAVENOUS | Status: AC | PRN

## 2024-04-20 MED ORDER — METOPROLOL SUCCINATE ER 25 MG PO TB24: 150.0000 mg | ORAL_TABLET | Freq: Every day | ORAL | Status: DC

## 2024-04-20 MED ORDER — OXYBUTYNIN CHLORIDE ER 10 MG PO TB24
10.0000 mg | ORAL_TABLET | Freq: Every day | ORAL | Status: DC
  Administered 2024-04-20 – 2024-04-21 (×3): 10 mg via ORAL
  Filled 2024-04-20 (×3): qty 1

## 2024-04-20 NOTE — ED Notes (Signed)
 1st unit PRBC completed with no adverse effect . VSS /respirations unlabored , afebrile .

## 2024-04-20 NOTE — Consult Note (Signed)
 Consultation  Referring Provider: [facility]/Ortiz Primary Care Physician:  Joesph Annabella HERO, FNP Primary Gastroenterologist: [facility]/Dr. Cindie  Reason for Consultation: Profound anemia, heme positive stool in setting of Eliquis  and patient with history of GAVE  HPI: [name] [name] is a 83 y.o. female with history of atrial fibrillation status post Watchman with persistent atrial fibrillation, recent history of bilateral PEs June 2025 on Eliquis , diabetes mellitus, hypertension, chronic kidney disease stage III, and congestive heart failure.  Patient also has previous history of documented GAVE and has had several procedures over the past few years for treatment of GAVE. She presents now after seeing cardiology yesterday with complaints of progressive shortness of breath over the past week or so and was found to have a hemoglobin of 4.6 and directed to the emergency room.  MCV is normal at 84 platelets 322 BNP 3865 BUN 43/creatinine 1.48 LFTs within normal limits.  Patient documented heme positive stool in the ER.  She says she has not been noticing any melena or hematochezia and really has not noticed any change in his stools over the past couple of months.  She has no complaint of abdominal pain, no heartburn or indigestion, no nausea or vomiting.  Denies any recent chest pain. Her last dose of Eliquis  was yesterday morning. Viewing her labs her hemoglobin was 11.3 ,2 months ago.  EGD had been done in July 2024 by Dr. Cindie at [facility] for iron deficiency anemia.  She was found to have a small hiatal hernia, moderate gastric antral vascular ectasia without bleeding treated with APC and a 5 mm sessile polyp was noted in the duodenal bulb removed with cold snare.  She was to follow-up in 3 months and plan was for repeat EGDs on a as needed basis.  She also had EGD in August 2023-with similar findings of small hiatal hernia and GAVE treated with APC Apstil endoscopy had been done  in October 2022 with finding of GAVE, there was question of a tiny AVM in the mid to distal small bowel/proximal colon  Last colonoscopy May 2021/Dr. Rourk with removal of 3 small polyps and noted to have diverticulosis.  Chest x-ray last p.m. no acute abnormality  Patient has been transfused 3 units of packed RBCs and hemoglobin is up to 8.0 this morning.  She is tired as she was unable to sleep last night but otherwise says she feels better has no complaint of shortness of breath this morning   Past Medical History:  Diagnosis Date   Anemia    Arthritis    Ostearthritis- hips, knees, fingers   Atrial fibrillation (HCC)    Basal cell carcinoma (BCC) of right hand 10/2021   Dx by [facility]   Blood transfusion without reported diagnosis 2021   Chronic kidney disease    DVT (deep venous thrombosis) (HCC)    Dyspnea    Dysrhythmia    GERD (gastroesophageal reflux disease)    Headache(784.0)    tx. Valproic  acid   History of diabetes mellitus    History of kidney stones    Hypertension    Hypothyroidism    Presence of Watchman left atrial appendage closure device 06/15/2023   24mm Watchman FLX Pro device placed by Dr. Cindie   Pulmonary embolism [facility])     Past Surgical History:  Procedure Laterality Date   ABDOMINAL HYSTERECTOMY     BILATERAL HIP ARTHROSCOPY Left    BIOPSY  12/08/2019   Procedure: BIOPSY;  Surgeon: Shaaron Charleston  M, MD;  [facility];  Service: Endoscopy;;   CATARACT EXTRACTION, BILATERAL     COLONOSCOPY N/A 12/08/2019   polyps (tubular adenoma), diverticulosis, colonic lipoma, no surveillance due to age   CYSTOSCOPY W/ URETERAL STENT PLACEMENT Right 12/25/2020   Procedure: CYSTOSCOPY WITH RETROGRADE PYELOGRAM/URETERAL STENT PLACEMENT;  Surgeon: Selma Donnice SAUNDERS, MD;  Location: [facility];  Service: Urology;  Laterality: Right;   CYSTOSCOPY/URETEROSCOPY/HOLMIUM LASER/STENT PLACEMENT Right 01/15/2021   Procedure:  CYSTOSCOPY/RETROGRADE/URETEROSCOPY/HOLMIUM LASER/STENT EXCHANGE;  Surgeon: Selma Donnice SAUNDERS, MD;  Location: [facility];  Service: Urology;  Laterality: Right;   ESOPHAGOGASTRODUODENOSCOPY N/A 12/08/2019   normal esophagus with 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   ESOPHAGOGASTRODUODENOSCOPY (EGD) WITH PROPOFOL  N/A 05/18/2021   Surgeon: Magod, Marc, MD;   Tiny hiatal hernia, gastric antral vascular ectasia s/p ablation, normal duodenum, normal jejunum.   ESOPHAGOGASTRODUODENOSCOPY (EGD) WITH PROPOFOL  N/A 03/10/2022   Procedure: ESOPHAGOGASTRODUODENOSCOPY (EGD) WITH PROPOFOL ;  Surgeon: Cindie Carlin POUR, DO;  [facility];  Service: Endoscopy;  Laterality: N/A;  10:45am   ESOPHAGOGASTRODUODENOSCOPY (EGD) WITH PROPOFOL  N/A 02/01/2023   Procedure: ESOPHAGOGASTRODUODENOSCOPY (EGD) WITH PROPOFOL ;  Surgeon: Cindie Carlin POUR, DO;  [facility];  Service: Endoscopy;  Laterality: N/A;  9:00 am, asa 3   EYE SURGERY  2015   Cataracts   GIVENS CAPSULE STUDY N/A 01/13/2020   Procedure: GIVENS CAPSULE STUDY;  Surgeon: Shaaron Lamar HERO, MD;  [facility];  Service: Endoscopy;  Laterality: N/A;  7:30am   GIVENS CAPSULE STUDY N/A 05/04/2021   Procedure: GIVENS CAPSULE STUDY;  Surgeon: Rosalie Kitchens, MD;  [facility]: [facility];  Service: Endoscopy;  Laterality: N/A;   JOINT REPLACEMENT  2012 2015   Hips   LEFT ATRIAL APPENDAGE OCCLUSION N/A 06/15/2023   Procedure: LEFT ATRIAL APPENDAGE OCCLUSION;  Surgeon: Cindie Ole DASEN, MD;  Location: [facility];  Service: Cardiovascular;  Laterality: N/A;   MULTIPLE TOOTH EXTRACTIONS     60's   PARATHYROIDECTOMY     POLYPECTOMY  12/08/2019   Procedure: POLYPECTOMY;  Surgeon: Shaaron Lamar HERO, MD;  [facility];  Service: Endoscopy;;   POLYPECTOMY  02/01/2023   Procedure: POLYPECTOMY;  Surgeon: Cindie Carlin POUR, DO;  [facility];  Service: Endoscopy;;   RADIOFREQUENCY ABLATION   05/18/2021   Procedure: RADIO FREQUENCY ABLATION;  Surgeon: Rosalie Kitchens, MD;  [facility];  Service: Endoscopy;;   TEE WITHOUT CARDIOVERSION N/A 07/06/2021   Procedure: TRANSESOPHAGEAL ECHOCARDIOGRAM (TEE);  Surgeon: Kate Lonni CROME, MD;  Location[facility] ENDOSCOPY;  Service: Cardiovascular;  Laterality: N/A;   TEE WITHOUT CARDIOVERSION N/A 06/15/2023   Procedure: TRANSESOPHAGEAL ECHOCARDIOGRAM;  Surgeon: Cindie Ole DASEN, MD;  Location: [facility] INVASIVE CV LAB;  Service: Cardiovascular;  Laterality: N/A;   TOTAL HIP ARTHROPLASTY Right 10/29/2013   Procedure: RIGHT TOTAL HIP ARTHROPLASTY ANTERIOR APPROACH;  Surgeon: Donnice JONETTA Car, MD;  Location: [facility];  Service: Orthopedics;  Laterality: Right;   TRANSESOPHAGEAL ECHOCARDIOGRAM (CATH LAB) N/A 08/18/2023   Procedure: TRANSESOPHAGEAL ECHOCARDIOGRAM;  Surgeon: Francyne Headland, MD;  Location: [facility];  Service: Cardiovascular;  Laterality: N/A;   TUBAL LIGATION  1972    Prior to Admission medications   Medication Sig Start Date End Date Taking? Authorizing Provider  acetaminophen  (TYLENOL ) 325 MG tablet Take 2 tablets (650 mg total) by mouth every 6 (six) hours as needed for mild pain or headache (or Fever >/= 101). 04/04/19  Yes Pearlean Manus, MD  allopurinol  (ZYLOPRIM ) 100  MG tablet Take 1 tablet by mouth once daily 03/29/24  Yes Joesph Annabella HERO, FNP  apixaban  (ELIQUIS ) 5 MG TABS tablet Take 1 tablet (5 mg total) by mouth 2 (two) times daily. 02/14/24  Yes Joesph Annabella HERO, FNP  atorvastatin  (LIPITOR) 20 MG tablet Take 1 tablet (20 mg total) by mouth daily. 11/24/23  Yes Joesph Annabella HERO, FNP  cetirizine  (ZYRTEC ) 10 MG tablet Take 10 mg by mouth daily.   Yes [provider]  dapagliflozin  propanediol (FARXIGA ) 10 MG TABS tablet Take 1 tablet (10 mg total) by mouth daily. 09/22/23  Yes Joesph Annabella HERO, FNP  ferrous sulfate  325 (65 FE) MG EC tablet Take 325 mg by mouth daily.   Yes [provider]   furosemide  (LASIX ) 20 MG tablet Take 1 tablet (20 mg total) by mouth daily as needed for fluid or edema. 01/19/24  Yes Cheryle Page, MD  GNP CRANBERRY EXTRACT PO Take 1 tablet by mouth in the morning.   Yes [provider]  levothyroxine  (SYNTHROID ) 50 MCG tablet Take 1 tablet (50 mcg total) by mouth daily before breakfast. 11/24/23  Yes Joesph Annabella HERO, FNP  metoprolol  succinate (TOPROL -XL) 50 MG 24 hr tablet Take 3 tablets (150 mg total) by mouth daily. 02/16/24  Yes Joesph Annabella HERO, FNP  oxybutynin  (DITROPAN -XL) 10 MG 24 hr tablet Take 1 tablet (10 mg total) by mouth at bedtime. 04/05/24  Yes Joesph Annabella HERO, FNP  pantoprazole  (PROTONIX ) 40 MG tablet Take 1 tablet (40 mg total) by mouth 2 (two) times daily before a meal. 11/24/23  Yes Joesph Annabella HERO, FNP  polyethylene glycol powder (GLYCOLAX /MIRALAX ) 17 GM/SCOOP powder Take 1 capful (17 g) by mouth daily as needed for moderate constipation. 01/19/24  Yes Cheryle Page, MD    Current Facility-Administered Medications  Medication Dose Route Frequency Provider Last Rate Last Admin   0.9 %  sodium chloride  infusion  250 mL Intravenous PRN Sundil, Subrina, MD       acetaminophen  (TYLENOL ) tablet 650 mg  650 mg Oral Q6H PRN Sundil, Subrina, MD       Or   acetaminophen  (TYLENOL ) suppository 650 mg  650 mg Rectal Q6H PRN Sundil, Subrina, MD       levothyroxine  (SYNTHROID ) tablet 50 mcg  50 mcg Oral QAC breakfast Sundil, Subrina, MD   50 mcg at 04/20/24 0507   metoprolol  succinate (TOPROL -XL) 24 hr tablet 150 mg  150 mg Oral Daily Odell Celinda Balo, MD   150 mg at 04/20/24 0932   ondansetron  (ZOFRAN ) tablet 4 mg  4 mg Oral Q6H PRN Sundil, Subrina, MD       Or   ondansetron  (ZOFRAN ) injection 4 mg  4 mg Intravenous Q6H PRN Sundil, Subrina, MD       oxybutynin  (DITROPAN -XL) 24 hr tablet 10 mg  10 mg Oral QHS Sundil, Subrina, MD   10 mg at 04/20/24 0141   pantoprazole  (PROTONIX ) injection 40 mg  40 mg Intravenous Q12H Sundil, Subrina,  MD   40 mg at 04/20/24 9387   sodium chloride  flush (NS) 0.9 % injection 3 mL  3 mL Intravenous Q12H Sundil, Subrina, MD   3 mL at 04/20/24 9066   sodium chloride  flush (NS) 0.9 % injection 3 mL  3 mL Intravenous PRN Sundil, Subrina, MD       Current Outpatient Medications  Medication Sig Dispense Refill   acetaminophen  (TYLENOL ) 325 MG tablet Take 2 tablets (650 mg total) by mouth every 6 (six) hours as  needed for mild pain or headache (or Fever >/= 101). 12 tablet 0   allopurinol  (ZYLOPRIM ) 100 MG tablet Take 1 tablet by mouth once daily 90 tablet 0   apixaban  (ELIQUIS ) 5 MG TABS tablet Take 1 tablet (5 mg total) by mouth 2 (two) times daily. 60 tablet 3   atorvastatin  (LIPITOR) 20 MG tablet Take 1 tablet (20 mg total) by mouth daily. 90 tablet 3   cetirizine  (ZYRTEC ) 10 MG tablet Take 10 mg by mouth daily.     dapagliflozin  propanediol (FARXIGA ) 10 MG TABS tablet Take 1 tablet (10 mg total) by mouth daily. 90 tablet 5   ferrous sulfate  325 (65 FE) MG EC tablet Take 325 mg by mouth daily.     furosemide  (LASIX ) 20 MG tablet Take 1 tablet (20 mg total) by mouth daily as needed for fluid or edema.     GNP CRANBERRY EXTRACT PO Take 1 tablet by mouth in the morning.     levothyroxine  (SYNTHROID ) 50 MCG tablet Take 1 tablet (50 mcg total) by mouth daily before breakfast. 90 tablet 2   metoprolol  succinate (TOPROL -XL) 50 MG 24 hr tablet Take 3 tablets (150 mg total) by mouth daily. 270 tablet 3   oxybutynin  (DITROPAN -XL) 10 MG 24 hr tablet Take 1 tablet (10 mg total) by mouth at bedtime. 90 tablet 0   pantoprazole  (PROTONIX ) 40 MG tablet Take 1 tablet (40 mg total) by mouth 2 (two) times daily before a meal. 180 tablet 0   polyethylene glycol powder (GLYCOLAX /MIRALAX ) 17 GM/SCOOP powder Take 1 capful (17 g) by mouth daily as needed for moderate constipation. 238 g 0    Allergies as of 04/19/2024   (No Known Allergies)    Family History  Problem Relation Age of Onset   Colitis Sister 90        alive   Cancer Sister 53       colon   Cancer Mother 81       uterine   Heart disease Father 60       heart failure   Heart attack Brother 67   Healthy Daughter    Healthy Son    Pulmonary embolism Brother 46   Heart disease Brother    Healthy Son    Healthy Son     Social History   Socioeconomic History   Marital status: Widowed    Spouse name: Not on file   Number of children: 4   Years of education: 76   Highest education level: 12th grade  Occupational History   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 She has four adult children and one son lives with her.       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   Social Drivers of Corporate investment banker Strain: Low Risk  (01/23/2024)   Overall Financial Resource Strain (CARDIA)    Difficulty of Paying Living Expenses: Not very hard  Food Insecurity: No Food Insecurity (01/23/2024)   Hunger Vital Sign    Worried About Running Out of  Food in the Last Year: Never true    Ran Out of Food in the Last Year: Never true  Transportation Needs: No Transportation Needs (01/23/2024)   PRAPARE - Administrator, Civil Service (Medical): No    Lack of Transportation (Non-Medical): No  Physical Activity: Insufficiently Active (01/23/2024)   Exercise Vital Sign    Days of Exercise per Week: 1 day    Minutes of Exercise per Session: 10 min  Stress: No Stress Concern Present (11/23/2023)   [employer] of Occupational Health - Occupational Stress Questionnaire    Feeling of Stress : Not at all  Social Connections: Moderately Isolated (01/23/2024)   Social Connection and  Isolation Panel    Frequency of Communication with Friends and Family: More than three times a week    Frequency of Social Gatherings with Friends and Family: Twice a week    Attends Religious Services: More than 4 times per year    Active Member of [employer] or Organizations: No    Attends Banker Meetings: Not on file    Marital Status: Widowed  Intimate Partner Violence: Not At Risk (01/22/2024)   Humiliation, Afraid, Rape, and Kick questionnaire    Fear of Current or Ex-Partner: No    Emotionally Abused: No    Physically Abused: No    Sexually Abused: No    Review of Systems: Pertinent positive and negative review of systems were noted in the above HPI section.  All other review of systems was otherwise negative.   Physical Exam: Vital signs in last 24 hours: Temp:  [97.6 F (36.4 C)-98.2 F (36.8 C)] 98.2 F (36.8 C) (09/27 0723) Pulse Rate:  [45-150] 97 (09/27 0934) Resp:  [16-27] 16 (09/27 0934) BP: (102-137)/(45-95) 130/79 (09/27 0934) SpO2:  [93 %-100 %] 99 % (09/27 0934) Weight:  [87.5 kg] 87.5 kg (09/26 2138)   General:   Alert,  Well-developed, well-nourished, elderly white female pleasant and cooperative in NAD, Head:  Normocephalic and atraumatic. Eyes:  Sclera clear, no icterus.   Conjunctiva pink. Ears:  Normal auditory acuity. Nose:  No deformity, discharge,  or lesions. Mouth:  No deformity or lesions.   Neck:  Supple; no masses or thyromegaly. Lungs:  Clear throughout to auscultation.   No wheezes, crackles, or rhonchi . Heart:  Regular rate and rhythm; no murmurs, clicks, rubs,  or gallops. Abdomen:  Soft,nontender, BS active,nonpalp mass or hsm.   Rectal: Not done, stool documented Hemoccult positive Msk:  Symmetrical without gross deformities. . Pulses:  Normal pulses noted. Extremities:  Without clubbing or edema. Neurologic:  Alert and  oriented x4;  grossly normal neurologically. Skin:  Intact without significant lesions or  rashes.. Psych:  Alert and cooperative. Normal mood and affect.  Intake/Output from previous day: 09/26 0701 - 09/27 0700 In: 668 [Blood:668] Out: -  Intake/Output this shift: No intake/output data recorded.  Lab Results: Recent Labs    04/19/24 1632 04/19/24 2149 04/19/24 2151 04/20/24 0722  WBC 5.5 5.2  --  5.7  HGB 4.6* 4.3* 5.4* 8.0*  HCT 17.7* 16.9* 16.0* 26.9*  PLT 332 301  --  265   BMET Recent Labs    04/17/24 1205 04/19/24 2149 04/19/24 2151 04/20/24 0722  NA 142 138 140 140  K 4.7 4.0 4.0 3.5  CL 110* 110 110 109  CO2 15* 18*  --  20*  GLUCOSE 97 172* 169* 105*  BUN 30* 43* 39* 36*  CREATININE 1.29* 1.48* 1.60* 1.40*  CALCIUM  11.0* 10.8*  --  10.7*   LFT Recent Labs    04/20/24 0722  PROT 5.2*  ALBUMIN  3.2*  AST 14*  ALT 8  ALKPHOS 67  BILITOT 1.4*   PT/INR No results for input(s): LABPROT, INR in the last 72 hours. Hepatitis Panel No results for input(s): HEPBSAG, HCVAB, HEPAIGM, HEPBIGM in the last 72 hours.   IMPRESSION:  #90 83 year old white female directed to the ER after outpatient labs done by cardiology after she was seen with complaints of progressive shortness of breath yesterday with finding of hemoglobin of 4.6.  Hemoglobin was 11.3 about 2 months ago. Stool documented Hemoccult positive, patient has no history of melena or hematochezia and has not noticed any changes in her bowels over the past couple of months. Had noticed increased fatigue and then shortness of breath over the past couple of weeks. Patient has known GAVE (gastric antral vascular ectasia) which has required treatment with APC on several occasions in the past.  Last EGD was done in July 2024 with treatment for GAVE.  Very likely she has had recurrent subacute GI blood loss secondary to GAVE in setting of chronic anticoagulation with Eliquis   #2 recent bilateral PEs June 2025-on Eliquis  #3.  History of atrial fibrillation status post Watchman with  persistent atrial fibrillation #4 diabetes mellitus #5.  Congestive heart failure #6.  Chronic kidney disease stage III #7.  Diabetes mellitus #8 history of adenomatous colon polyps-status post colonoscopy 2021  PLAN: Regular diet today, n.p.o. after midnight Continue to hold Eliquis  Continue to trend hemoglobin and transfuse as indicated Will check iron studies Will be scheduled for EGD tomorrow morning with Dr. Albertus 04/21/2024 with treatment of GAVE.  Procedure was discussed in detail with the patient including indications risk and benefits and she is agreeable to proceed. GI will follow with you, given recent PEs, we should be able to resume Eliquis  quickly post procedure   Yosiah Jasmin PA-C 04/20/2024, 10:15 AM

## 2024-04-20 NOTE — ED Notes (Signed)
 Breakfast tray ordered

## 2024-04-20 NOTE — H&P (Signed)
 History and Physical    [name] [name] FMW:[id number] DOB: Jul 24, 1941 DOA: 04/19/2024  PCP: Joesph Annabella HERO, FNP   Patient coming from: Home   Chief Complaint:  Chief Complaint  Patient presents with   low hgb   ED TRIAGE note: The pt went to her doctor earlier today and had blood drawn  she was called tonight and was told that she had a low hgb and needed a transfusion  her hgb was 4.9  she has weakness and some sob and some chest pain  she has had this same difficulty in the past      HPI:  [name] [name] is a 83 y.o. female with medical history significant of HFrEF 35 to 40%, mitral valve disease, essential hypertension, CKD 3A, iron deficiency anemia, previous history of GI bleed, aortic atherosclerosis, persistent atrial fibrillation status post LAAO Watchman device 06/14/2023 and on Eliquis , history of PE on Eliquis , essential hypertension, GAVE disease and iron deficiency anemia presented to emergency department referred from cardiology clinic found to have low hemoglobin 4.6. Patient denies any bleeding.  Patient reported feeling short of breath and dizziness.  Denies any chest pain, abdominal pain, nausea, vomiting, hematemesis and melena.  Patient reported she has history of chronic constipation and denies any dark-colored stool and fresh blood per rectum.   ED Course:  At presentation to ED patient found borderline hypotensive otherwise hemodynamically stable. CBC showing low hemoglobin 4.3 (baseline hemoglobin around 11) low hematocrit 17.7 normal MCV.  Normal WBC and platelet count. CMP showing low bicarb 18, blood glucose 172, elevated troponin 43, creatinine 1.48, elevated calcium  10.8, and GFR 35.  Renal function at baseline. Positive for BT.  In the ED 2 unit blood transfusion has been ordered IV Protonix  40 mg.  EDP reported that during FOBT check stool sample look black color concern for melena. ED physician sent secure chat message to [facility] for  further evaluation.  Hospitalist has been consulted for further evaluation management of acute symptomatic anemia.     Significant labs in the ED: Lab Orders         CBC with Differential         Comprehensive metabolic panel         CBC         Comprehensive metabolic panel         I-stat chem 8, ED (not at [facility], [facility] or [facility])         POC occult blood, ED       Review of Systems:  Review of Systems  Constitutional:  Negative for chills, fever and weight loss.  Respiratory:  Positive for shortness of breath. Negative for cough.   Cardiovascular:  Negative for chest pain, palpitations, claudication and leg swelling.  Gastrointestinal:  Negative for abdominal pain, blood in stool, diarrhea, heartburn, melena, nausea and vomiting.  Musculoskeletal:  Negative for myalgias.  Neurological:  Positive for dizziness. Negative for loss of consciousness, weakness and headaches.  Psychiatric/Behavioral:  The patient does not have insomnia.     Past Medical History:  Diagnosis Date   Anemia    Arthritis    Ostearthritis- hips, knees, fingers   Atrial fibrillation (HCC)    Basal cell carcinoma (BCC) of right hand 10/2021   Dx by [facility]   Blood transfusion without reported diagnosis 2021   Chronic kidney disease    DVT (deep venous thrombosis) (HCC)    Dyspnea    Dysrhythmia    GERD (gastroesophageal  reflux disease)    Headache(784.0)    tx. Valproic  acid   History of diabetes mellitus    History of kidney stones    Hypertension    Hypothyroidism    Presence of Watchman left atrial appendage closure device 06/15/2023   24mm Watchman FLX Pro device placed by Dr. Cindie   Pulmonary embolism [facility])     Past Surgical History:  Procedure Laterality Date   ABDOMINAL HYSTERECTOMY     BILATERAL HIP ARTHROSCOPY Left    BIOPSY  12/08/2019   Procedure: BIOPSY;  Surgeon: Shaaron Lamar HERO, MD;  [facility];  Service: Endoscopy;;   CATARACT EXTRACTION, BILATERAL      COLONOSCOPY N/A 12/08/2019   polyps (tubular adenoma), diverticulosis, colonic lipoma, no surveillance due to age   CYSTOSCOPY W/ URETERAL STENT PLACEMENT Right 12/25/2020   Procedure: CYSTOSCOPY WITH RETROGRADE PYELOGRAM/URETERAL STENT PLACEMENT;  Surgeon: Selma Donnice SAUNDERS, MD;  Location: [facility];  Service: Urology;  Laterality: Right;   CYSTOSCOPY/URETEROSCOPY/HOLMIUM LASER/STENT PLACEMENT Right 01/15/2021   Procedure: CYSTOSCOPY/RETROGRADE/URETEROSCOPY/HOLMIUM LASER/STENT EXCHANGE;  Surgeon: Selma Donnice SAUNDERS, MD;  Location: [facility];  Service: Urology;  Laterality: Right;   ESOPHAGOGASTRODUODENOSCOPY N/A 12/08/2019   normal esophagus with 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   ESOPHAGOGASTRODUODENOSCOPY (EGD) WITH PROPOFOL  N/A 05/18/2021   Surgeon: Rosalie Kitchens, MD;   Tiny hiatal hernia, gastric antral vascular ectasia s/p ablation, normal duodenum, normal jejunum.   ESOPHAGOGASTRODUODENOSCOPY (EGD) WITH PROPOFOL  N/A 03/10/2022   Procedure: ESOPHAGOGASTRODUODENOSCOPY (EGD) WITH PROPOFOL ;  Surgeon: Cindie Carlin POUR, DO;  [facility];  Service: Endoscopy;  Laterality: N/A;  10:45am   ESOPHAGOGASTRODUODENOSCOPY (EGD) WITH PROPOFOL  N/A 02/01/2023   Procedure: ESOPHAGOGASTRODUODENOSCOPY (EGD) WITH PROPOFOL ;  Surgeon: Cindie Carlin POUR, DO;  [facility];  Service: Endoscopy;  Laterality: N/A;  9:00 am, asa 3   EYE SURGERY  2015   Cataracts   GIVENS CAPSULE STUDY N/A 01/13/2020   Procedure: GIVENS CAPSULE STUDY;  Surgeon: Shaaron Lamar HERO, MD;  [facility];  Service: Endoscopy;  Laterality: N/A;  7:30am   GIVENS CAPSULE STUDY N/A 05/04/2021   Procedure: GIVENS CAPSULE STUDY;  Surgeon: Rosalie Kitchens, MD;  [facility];  Service: Endoscopy;  Laterality: N/A;   JOINT REPLACEMENT  2012 2015   Hips   LEFT ATRIAL APPENDAGE OCCLUSION N/A 06/15/2023   Procedure: LEFT ATRIAL APPENDAGE OCCLUSION;  Surgeon: Cindie Ole DASEN, MD;   [facility]: [facility];  Service: Cardiovascular;  Laterality: N/A;   MULTIPLE TOOTH EXTRACTIONS     60's   PARATHYROIDECTOMY     POLYPECTOMY  12/08/2019   Procedure: POLYPECTOMY;  Surgeon: Shaaron Lamar HERO, MD;  [facility];  Service: Endoscopy;;   POLYPECTOMY  02/01/2023   Procedure: POLYPECTOMY;  Surgeon: Cindie Carlin POUR, DO;  [facility];  Service: Endoscopy;;   RADIOFREQUENCY ABLATION  05/18/2021   Procedure: RADIO FREQUENCY ABLATION;  Surgeon: Rosalie Kitchens, MD;  [facility];  Service: Endoscopy;;   TEE WITHOUT CARDIOVERSION N/A 07/06/2021   Procedure: TRANSESOPHAGEAL ECHOCARDIOGRAM (TEE);  Surgeon: Kate Lonni CROME, MD;  Location: [facility] ENDOSCOPY;  Service: Cardiovascular;  Laterality: N/A;   TEE WITHOUT CARDIOVERSION N/A 06/15/2023   Procedure: TRANSESOPHAGEAL ECHOCARDIOGRAM;  Surgeon: Cindie Ole DASEN, MD;  [facility]: [facility] INVASIVE CV LAB;  Service: Cardiovascular;  Laterality: N/A;   TOTAL HIP ARTHROPLASTY Right 10/29/2013   Procedure: RIGHT TOTAL HIP ARTHROPLASTY ANTERIOR APPROACH;  Surgeon: Donnice JONETTA Car, MD;  Location: [facility];  Service: Orthopedics;  Laterality: Right;   TRANSESOPHAGEAL ECHOCARDIOGRAM (CATH LAB) N/A 08/18/2023   Procedure: TRANSESOPHAGEAL ECHOCARDIOGRAM;  Surgeon: Francyne Headland, MD;  [facility]: [facility];  Service: Cardiovascular;  Laterality: N/A;   TUBAL LIGATION  1972     reports that she has never smoked. She has never used smokeless tobacco. She reports that she does not drink alcohol  and does not use drugs.  No Known Allergies  Family History  Problem Relation Age of Onset   Colitis Sister 78       alive   Cancer Sister 61       colon   Cancer Mother 87       uterine   Heart disease Father 47       heart failure   Heart attack Brother 52   Healthy Daughter    Healthy Son    Pulmonary embolism Brother 46   Heart disease Brother    Healthy Son    Healthy Son     Prior to Admission  medications   Medication Sig Start Date End Date Taking? Authorizing Provider  acetaminophen  (TYLENOL ) 325 MG tablet Take 2 tablets (650 mg total) by mouth every 6 (six) hours as needed for mild pain or headache (or Fever >/= 101). 04/04/19   Pearlean Manus, MD  allopurinol  (ZYLOPRIM ) 100 MG tablet Take 1 tablet by mouth once daily 03/29/24   Joesph Annabella HERO, FNP  apixaban  (ELIQUIS ) 5 MG TABS tablet Take 1 tablet (5 mg total) by mouth 2 (two) times daily. 02/14/24   Joesph Annabella HERO, FNP  atorvastatin  (LIPITOR) 20 MG tablet Take 1 tablet (20 mg total) by mouth daily. 11/24/23   Joesph Annabella HERO, FNP  cetirizine  (ZYRTEC ) 10 MG tablet Take 10 mg by mouth daily.    [provider]  dapagliflozin  propanediol (FARXIGA ) 10 MG TABS tablet Take 1 tablet (10 mg total) by mouth daily. 09/22/23   Joesph Annabella HERO, FNP  ferrous sulfate  325 (65 FE) MG EC tablet Take 325 mg by mouth daily.    [provider]  furosemide  (LASIX ) 20 MG tablet Take 1 tablet (20 mg total) by mouth daily as needed for fluid or edema. 01/19/24   Cheryle Page, MD  GNP CRANBERRY EXTRACT PO Take 1 tablet by mouth in the morning.    [provider]  levothyroxine  (SYNTHROID ) 50 MCG tablet Take 1 tablet (50 mcg total) by mouth daily before breakfast. 11/24/23   Joesph Annabella HERO, FNP  metoprolol  succinate (TOPROL -XL) 50 MG 24 hr tablet Take 3 tablets (150 mg total) by mouth daily. 02/16/24   Joesph Annabella HERO, FNP  oxybutynin  (DITROPAN -XL) 10 MG 24 hr tablet Take 1 tablet (10 mg total) by mouth at bedtime. 04/05/24   Joesph Annabella HERO, FNP  pantoprazole  (PROTONIX ) 40 MG tablet Take 1 tablet (40 mg total) by mouth 2 (two) times daily before a meal. 11/24/23   Joesph Annabella HERO, FNP  polyethylene glycol powder (GLYCOLAX /MIRALAX ) 17 GM/SCOOP powder Take 1 capful (17 g) by mouth daily as needed for moderate constipation. 01/19/24   Cheryle Page, MD     Physical Exam: Vitals:   04/20/24 0022 04/20/24 0030 04/20/24 0045  04/20/24 0100  BP: 103/64 (!) 103/57 (!) 105/57 (!) 105/55  Pulse: 94 95 92 97  Resp: 20 (!) 22 20 19   Temp: 98.1 F (36.7 C)     TempSrc: Oral     SpO2: 98% 98% 97% 100%  Weight:      Height:  Physical Exam Vitals and nursing note reviewed.  Constitutional:      Appearance: She is not ill-appearing.  HENT:     Head: Normocephalic.     Mouth/Throat:     Mouth: Mucous membranes are moist.  Eyes:     Pupils: Pupils are equal, round, and reactive to light.  Cardiovascular:     Rate and Rhythm: Normal rate. Rhythm irregular.     Pulses: Normal pulses.     Heart sounds: Normal heart sounds.  Pulmonary:     Effort: Pulmonary effort is normal.     Breath sounds: Normal breath sounds.  Abdominal:     Palpations: Abdomen is soft.     Tenderness: There is no abdominal tenderness. There is no guarding.  Musculoskeletal:     Cervical back: Neck supple. Tenderness present.     Right lower leg: No edema.     Left lower leg: No edema.  Skin:    Capillary Refill: Capillary refill takes less than 2 seconds.  Neurological:     Mental Status: She is alert and oriented to person, place, and time.  Psychiatric:        Mood and Affect: Mood normal.      Labs on Admission: I have personally reviewed following labs and imaging studies  CBC: Recent Labs  Lab 04/19/24 1632 04/19/24 2149 04/19/24 2151  WBC 5.5 5.2  --   NEUTROABS  --  3.5  --   HGB 4.6* 4.3* 5.4*  HCT 17.7* 16.9* 16.0*  MCV 84 86.2  --   PLT 332 301  --    Basic Metabolic Panel: Recent Labs  Lab 04/17/24 1205 04/19/24 2149 04/19/24 2151  NA 142 138 140  K 4.7 4.0 4.0  CL 110* 110 110  CO2 15* 18*  --   GLUCOSE 97 172* 169*  BUN 30* 43* 39*  CREATININE 1.29* 1.48* 1.60*  CALCIUM  11.0* 10.8*  --    GFR: Estimated Creatinine Clearance: 28.4 mL/min (A) (by C-G formula based on SCr of 1.6 mg/dL (H)). Liver Function Tests: Recent Labs  Lab 04/19/24 2149  AST 12*  ALT 9  ALKPHOS 72  BILITOT 0.5   PROT 5.3*  ALBUMIN  3.3*   No results for input(s): LIPASE, AMYLASE in the last 168 hours. No results for input(s): AMMONIA in the last 168 hours. Coagulation Profile: No results for input(s): INR, PROTIME in the last 168 hours. Cardiac Enzymes: No results for input(s): CKTOTAL, CKMB, CKMBINDEX, TROPONINI, TROPONINIHS in the last 168 hours. BNP (last 3 results) Recent Labs    01/14/24 2108  BNP 458.3*   HbA1C: No results for input(s): HGBA1C in the last 72 hours. CBG: No results for input(s): GLUCAP in the last 168 hours. Lipid Profile: No results for input(s): CHOL, HDL, LDLCALC, TRIG, CHOLHDL, LDLDIRECT in the last 72 hours. Thyroid  Function Tests: No results for input(s): TSH, T4TOTAL, FREET4, T3FREE, THYROIDAB in the last 72 hours. Anemia Panel: No results for input(s): VITAMINB12, FOLATE, FERRITIN, TIBC, IRON, RETICCTPCT in the last 72 hours. Urine analysis:    Component Value Date/Time   COLORURINE YELLOW 01/14/2024 1018   APPEARANCEUR Cloudy (A) 02/14/2024 1215   LABSPEC 1.016 01/14/2024 1018   PHURINE 5.5 01/14/2024 1018   GLUCOSEU Trace (A) 02/14/2024 1215   HGBUR NEGATIVE 01/14/2024 1018   BILIRUBINUR Negative 02/14/2024 1215   KETONESUR NEGATIVE 01/14/2024 1018   PROTEINUR 1+ (A) 02/14/2024 1215   PROTEINUR TRACE (A) 01/14/2024 1018   UROBILINOGEN 0.2 10/22/2013 1127  NITRITE Positive (A) 02/14/2024 1215   NITRITE NEGATIVE 01/14/2024 1018   LEUKOCYTESUR 3+ (A) 02/14/2024 1215   LEUKOCYTESUR MODERATE (A) 01/14/2024 1018    Radiological Exams on Admission: I have personally reviewed images DG Chest Portable 1 View Result Date: 04/19/2024 EXAM: 1 VIEW(S) XRAY OF THE CHEST 04/19/2024 09:53:51 PM COMPARISON: Comparison with radiograph of 04/17/2024. CLINICAL HISTORY: SHOB. FINDINGS: LUNGS AND PLEURA: No focal pulmonary opacity. No pulmonary edema. No pleural effusion. No pneumothorax. HEART AND  MEDIASTINUM: Left atrial appendage closure device. Stable enlargement of the cardiomediastinal silhouette. Aortic atherosclerotic calcification. BONES AND SOFT TISSUES: No acute osseous abnormality. IMPRESSION: 1. No acute cardiopulmonary abnormality. Electronically signed by: Norman Gatlin MD 04/19/2024 10:01 PM EDT RP Workstation: [id number]     EKG: My personal interpretation of EKG shows: Atrial fibrillation heart rate 100, nonspecific history of abnormality.    Assessment/Plan: Principal Problem:   Symptomatic anemia Active Problems:   Occult GI bleeding   Essential hypertension   Hypothyroidism   Iron deficiency anemia   HFrEF (heart failure with reduced ejection fraction) (HCC)   History of GAVE (gastric antral vascular ectasia)   Persistent atrial fibrillation (HCC)   History of pulmonary embolism   GI bleeding    Assessment and Plan: Symptomatic anemia GI bleed-concern for melena History of GAVE disease -Presenting to emergency department referred from cardiology clinic found to have low hemoglobin 4.6.  Patient complaining about dizziness and shortness of breath.  Baseline hemoglobin around 10. -At presentation to ED patient found hemodynamically stable.  CBC showing low hemoglobin 4.6.  FOBT positive. - Per chart review patient follows [facility] gastroenterology have previous EGD and colonoscopy which showed evidence of GAVE disease. - Most recent endoscopy 01/2023 showed gastric antral vascular ectasia without bleeding treated with argon plasma coagulation.  There was another polyp resected.  Small hiatal hernia. -Rectal exam showed maroon-colored stool in the rectal vault. - Continue IV Protonix  40 mg twice daily. - Transfusing total 3 units of blood (blood transfusion started at 1 AM).  Continue to monitor CBC and transfuse as needed goal to keep hemoglobin above 8. -EDP consulted [facility] for further evaluation and management. -Starting maintenance fluid D5 LR 50  cc/h (advanced heart failure EF 40 to 45% ).   Essential hypertension HFrEF 40 to 45% -Holding blood pressure regimen given patient is borderline hypotensive except continue Toprol -XL 50 mg daily.  At home patient takes Toprol -XL 150 mg daily however given patient is hypotensive decreasing dose to 50 mg. -Holding Lasix  in the setting of hypotension and GI bleed.   History of persistent atrial fibrillation s/p Watchman device 06/2023 -Holding Eliquis  in the setting of GI bleed. -Continue Toprol -XL 50 mg daily  History of pulm embolism - Holding Eliquis  in the setting of GI bleed  Hypothyroidism -Continue levothyroxine   CKD 3 A -Stable renal function.  Continue to monitor.   DVT prophylaxis:  SCDs Code Status:  Full Code Diet: Currently n.p.o. for possible EGD or colonoscopy Family Communication:   Family was present at bedside, at the time of interview. Opportunity was given to ask question and all questions were answered satisfactorily.  Disposition Plan: Continue to monitor H&H and transfuse as needed.  Goal hemoglobin to keep above 8. Consults: Gastroenterology Admission status:   Inpatient, Step Down Unit  Severity of Illness: The appropriate patient status for this patient is INPATIENT. Inpatient status is judged to be reasonable and necessary in order to provide the required intensity of service to ensure the patient's safety.  The patient's presenting symptoms, physical exam findings, and initial radiographic and laboratory data in the context of their chronic comorbidities is felt to place them at high risk for further clinical deterioration. Furthermore, it is not anticipated that the patient will be medically stable for discharge from the hospital within 2 midnights of admission.   * I certify that at the point of admission it is my clinical judgment that the patient will require inpatient hospital care spanning beyond 2 midnights from the point of admission due to high  intensity of service, high risk for further deterioration and high frequency of surveillance required.DEWAINE    Ahri Olson, MD [facility]  How to contact the [facility] Attending or Consulting provider 7A - 7P or covering provider during after hours 7P -7A, for this patient.  Check the care team in [facility] and look for a) attending/consulting [facility] provider listed and b) the [facility] team listed Log into [email or web address] and use [facility] universal password to access. If you do not have the password, please contact the hospital operator. Locate the [facility] provider you are looking for under [facility] and page to a number that you can be directly reached. If you still have difficulty reaching the provider, please page the [facility] (Director on Call) for the Hospitalists listed on amion for assistance.  04/20/2024, 1:41 AM

## 2024-04-20 NOTE — Progress Notes (Signed)
 [facility] PROGRESS NOTE    Progress Note  [name] [name]  FMW:[id number] DOB: 25-Apr-1941 DOA: 04/19/2024 PCP: Joesph Annabella HERO, FNP     Brief Narrative:   [name] [name] is an 83 y.o. female past medical history significant for HFrEF with an EF of 30% mitral valve disease, essential hypertension, chronic kidney disease stage IIIa, iron deficiency anemia history of GI bleed persistent atrial fibrillation status post Watchman procedure in 2024 on Eliquis , history of PE, GAVE disease iron deficiency anemia comes into the ED with hemoglobin of 4.6  Assessment/Plan:   Symptomatic anemia/Occult GI bleeding Sent by the cardiologist office for hemoglobin of 4.4 she was complaining of shortness of breath.  Repeated in the ED's confirm FOBT positive.  He had an EGD and colonoscopy with her [facility] gastroenterologist which showed GAVE disease.  Most recent EGD in 2024 showed gastric and vascular ectasis without bleeding. Rectal exam done by the ED showed maroon stool. Start on Protonix  twice a day. She will be transfused 3 units packed red blood cells. GI has been consulted for further evaluation.  Essential hypertension Blood pressure is borderline holding all antihypertensive medication with metoprolol . Will probably need Lasix  between transfusions.  Persistent atrial fibrillation status post Watchman device on 2024: Holding Eliquis  in setting of GI bleed. Continue metoprolol  150 mg daily  Chronic disease stage IIIa: With a baseline creatinine 1.2-1.4.   Remains at baseline.  History of PE: Holding Eliquis .  Hypothyroidism: Continue Synthroid .  Chronic kidney stage IIIa: Her creatinine remained at baseline.   DVT prophylaxis: scd Family Communication:none Status is: Inpatient Remains inpatient appropriate because: Acute GI bleed    Code Status:     Code Status Orders  (From admission, onward)           Start     Ordered   04/20/24 0116  Full  code  Continuous       Question:  By:  Answer:  Consent: discussion documented in EHR   04/20/24 0116           Code Status History     Date Active Date Inactive Code Status Order ID Comments User Context   01/14/2024 2016 01/19/2024 1746 Limited: Do not attempt resuscitation (DNR) -DNR-LIMITED -Do Not Intubate/DNI  [id number]  Silvester Ales, MD Inpatient   06/15/2023 1442 06/15/2023 2202 Full Code [id number]  Arvil Kate BIRCH, NP Inpatient   02/10/2022 0339 02/12/2022 2318 DNR [id number]  Zierle-Ghosh, Asia B, DO ED   02/10/2022 0026 02/10/2022 0339 Full Code [id number]  Zierle-Ghosh, Asia B, DO ED   05/01/2021 2020 05/04/2021 2137 Full Code [id number]  Sim Emery CROME, MD ED   02/07/2021 0111 02/10/2021 1932 Full Code [id number]  Sim Emery CROME, MD Inpatient   12/25/2020 0011 12/31/2020 0159 Full Code [id number]  Meade Verdon RAMAN, MD ED   12/06/2019 1933 12/09/2019 1946 Full Code [id number]  Elgergawy, Brayton RAMAN, MD Inpatient   04/03/2019 0946 04/04/2019 1818 Full Code [id number]  Zierle-Ghosh, [place] B, DO Inpatient   10/29/2013 1104 10/30/2013 1850 Full Code [id number]  Steffani Garnette PARAS, PA-C Inpatient      Advance Directive Documentation    Flowsheet Row Most Recent Value  Type of Advance Directive Living will, Healthcare Power of Attorney  Pre-existing out of facility DNR order (yellow form or pink MOST form) --  MOST Form in Place? --      IV Access:   Peripheral IV   Procedures and diagnostic studies:   DG Chest Portable 1 View Result  Date: 04/19/2024 EXAM: 1 VIEW(S) XRAY OF THE CHEST 04/19/2024 09:53:51 PM COMPARISON: Comparison with radiograph of 04/17/2024. CLINICAL HISTORY: SHOB. FINDINGS: LUNGS AND PLEURA: No focal pulmonary opacity. No pulmonary edema. No pleural effusion. No pneumothorax. HEART AND MEDIASTINUM: Left atrial appendage closure device. Stable enlargement of the cardiomediastinal silhouette. Aortic atherosclerotic calcification. BONES AND SOFT TISSUES: No acute osseous  abnormality. IMPRESSION: 1. No acute cardiopulmonary abnormality. Electronically signed by: Norman Gatlin MD 04/19/2024 10:01 PM EDT RP Workstation: [id number]     Medical Consultants:   None.   Subjective:    [name] [name] denies any abdominal pain no melanotic stools  Objective:    Vitals:   04/20/24 0430 04/20/24 0445 04/20/24 0447 04/20/24 0500  BP: 137/76 133/89 132/79 112/82  Pulse: (!) 103 (!) 111 87 94  Resp: (!) 21 (!) 25 18 (!) 26  Temp:   98.1 F (36.7 C)   TempSrc:   Oral   SpO2: 97% 100%  98%  Weight:      Height:       SpO2: 98 %   Intake/Output Summary (Last 24 hours) at 04/20/2024 0601 Last data filed at 04/20/2024 0228 Gross per 24 hour  Intake 378 ml  Output --  Net 378 ml   Filed Weights   04/19/24 2138  Weight: 87.5 kg    Exam: General exam: In no acute distress. Respiratory system: Good air movement and clear to auscultation. Cardiovascular system: S1 & S2 heard, RRR. No JVD. Gastrointestinal system: Abdomen is nondistended, soft and nontender.  Extremities: No pedal edema. Skin: No rashes, lesions or ulcers Psychiatry: Judgement and insight appear normal. Mood & affect appropriate.    Data Reviewed:    Labs: Basic Metabolic Panel: Recent Labs  Lab 04/17/24 1205 04/19/24 2149 04/19/24 2151  NA 142 138 140  K 4.7 4.0 4.0  CL 110* 110 110  CO2 15* 18*  --   GLUCOSE 97 172* 169*  BUN 30* 43* 39*  CREATININE 1.29* 1.48* 1.60*  CALCIUM  11.0* 10.8*  --    GFR Estimated Creatinine Clearance: 28.4 mL/min (A) (by C-G formula based on SCr of 1.6 mg/dL (H)). Liver Function Tests: Recent Labs  Lab 04/19/24 2149  AST 12*  ALT 9  ALKPHOS 72  BILITOT 0.5  PROT 5.3*  ALBUMIN  3.3*   No results for input(s): LIPASE, AMYLASE in the last 168 hours. No results for input(s): AMMONIA in the last 168 hours. Coagulation profile No results for input(s): INR, PROTIME in the last 168 hours. COVID-19 Labs  No results  for input(s): DDIMER, FERRITIN, LDH, CRP in the last 72 hours.  Lab Results  Component Value Date   SARSCOV2NAA NEGATIVE 02/09/2022   SARSCOV2NAA NEGATIVE 05/01/2021   SARSCOV2NAA NEGATIVE 02/07/2021   SARSCOV2NAA NEGATIVE 12/24/2020    CBC: Recent Labs  Lab 04/19/24 1632 04/19/24 2149 04/19/24 2151  WBC 5.5 5.2  --   NEUTROABS  --  3.5  --   HGB 4.6* 4.3* 5.4*  HCT 17.7* 16.9* 16.0*  MCV 84 86.2  --   PLT 332 301  --    Cardiac Enzymes: No results for input(s): CKTOTAL, CKMB, CKMBINDEX, TROPONINI in the last 168 hours. BNP (last 3 results) Recent Labs    04/19/24 1632  PROBNP 3,865*   CBG: No results for input(s): GLUCAP in the last 168 hours. D-Dimer: No results for input(s): DDIMER in the last 72 hours. Hgb A1c: No results for input(s): HGBA1C in the last 72 hours. Lipid Profile: No  results for input(s): CHOL, HDL, LDLCALC, TRIG, CHOLHDL, LDLDIRECT in the last 72 hours. Thyroid  function studies: No results for input(s): TSH, T4TOTAL, T3FREE, THYROIDAB in the last 72 hours.  Invalid input(s): FREET3 Anemia work up: No results for input(s): VITAMINB12, FOLATE, FERRITIN, TIBC, IRON, RETICCTPCT in the last 72 hours. Sepsis Labs: Recent Labs  Lab 04/19/24 1632 04/19/24 2149  WBC 5.5 5.2   Microbiology No results found for this or any previous visit (from the past 240 hours).   Medications:    sodium chloride    Intravenous Once   levothyroxine   50 mcg Oral QAC breakfast   metoprolol  succinate  50 mg Oral Daily   oxybutynin   10 mg Oral QHS   pantoprazole  (PROTONIX ) IV  40 mg Intravenous Q12H   sodium chloride  flush  3 mL Intravenous Q12H   Continuous Infusions:  sodium chloride      dextrose  5% lactated ringers  Stopped (04/20/24 0158)      LOS: 0 days   [name] [name]  [facility]  04/20/2024, 6:01 AM

## 2024-04-20 NOTE — ED Notes (Signed)
 Admitting MD discontinued IV fluids orders.

## 2024-04-20 NOTE — ED Notes (Signed)
 2nd unit PRBC started at 120 ml/hr , verified by 2nd RN , no adverse reaction , respirations unlabored , IV site unremarkable.

## 2024-04-20 NOTE — ED Notes (Signed)
 3rd unit PRBC infusing with no adverse effect , respirations unlabored , VSS/afebrile , IV site unremarkable .

## 2024-04-20 NOTE — ED Notes (Signed)
 3rd unit PRBC started at 120 ml/hr, IV site intact , VSS/afebrile , respirations unlabored .

## 2024-04-20 NOTE — H&P (View-Only) (Signed)
 Consultation  Referring Provider: [facility]/Ortiz Primary Care Physician:  Joesph Annabella HERO, FNP Primary Gastroenterologist: [facility]/Dr. Cindie  Reason for Consultation: Profound anemia, heme positive stool in setting of Eliquis  and patient with history of GAVE  HPI: [name] [name] is a 83 y.o. female with history of atrial fibrillation status post Watchman with persistent atrial fibrillation, recent history of bilateral PEs June 2025 on Eliquis , diabetes mellitus, hypertension, chronic kidney disease stage III, and congestive heart failure.  Patient also has previous history of documented GAVE and has had several procedures over the past few years for treatment of GAVE. She presents now after seeing cardiology yesterday with complaints of progressive shortness of breath over the past week or so and was found to have a hemoglobin of 4.6 and directed to the emergency room.  MCV is normal at 84 platelets 322 BNP 3865 BUN 43/creatinine 1.48 LFTs within normal limits.  Patient documented heme positive stool in the ER.  She says she has not been noticing any melena or hematochezia and really has not noticed any change in his stools over the past couple of months.  She has no complaint of abdominal pain, no heartburn or indigestion, no nausea or vomiting.  Denies any recent chest pain. Her last dose of Eliquis  was yesterday morning. Viewing her labs her hemoglobin was 11.3 ,2 months ago.  EGD had been done in July 2024 by Dr. Cindie at [facility] for iron deficiency anemia.  She was found to have a small hiatal hernia, moderate gastric antral vascular ectasia without bleeding treated with APC and a 5 mm sessile polyp was noted in the duodenal bulb removed with cold snare.  She was to follow-up in 3 months and plan was for repeat EGDs on a as needed basis.  She also had EGD in August 2023-with similar findings of small hiatal hernia and GAVE treated with APC Apstil endoscopy had been done  in October 2022 with finding of GAVE, there was question of a tiny AVM in the mid to distal small bowel/proximal colon  Last colonoscopy May 2021/Dr. Rourk with removal of 3 small polyps and noted to have diverticulosis.  Chest x-ray last p.m. no acute abnormality  Patient has been transfused 3 units of packed RBCs and hemoglobin is up to 8.0 this morning.  She is tired as she was unable to sleep last night but otherwise says she feels better has no complaint of shortness of breath this morning   Past Medical History:  Diagnosis Date   Anemia    Arthritis    Ostearthritis- hips, knees, fingers   Atrial fibrillation (HCC)    Basal cell carcinoma (BCC) of right hand 10/2021   Dx by [facility]   Blood transfusion without reported diagnosis 2021   Chronic kidney disease    DVT (deep venous thrombosis) (HCC)    Dyspnea    Dysrhythmia    GERD (gastroesophageal reflux disease)    Headache(784.0)    tx. Valproic  acid   History of diabetes mellitus    History of kidney stones    Hypertension    Hypothyroidism    Presence of Watchman left atrial appendage closure device 06/15/2023   24mm Watchman FLX Pro device placed by Dr. Cindie   Pulmonary embolism [facility])     Past Surgical History:  Procedure Laterality Date   ABDOMINAL HYSTERECTOMY     BILATERAL HIP ARTHROSCOPY Left    BIOPSY  12/08/2019   Procedure: BIOPSY;  Surgeon: Shaaron Charleston  M, MD;  [facility];  Service: Endoscopy;;   CATARACT EXTRACTION, BILATERAL     COLONOSCOPY N/A 12/08/2019   polyps (tubular adenoma), diverticulosis, colonic lipoma, no surveillance due to age   CYSTOSCOPY W/ URETERAL STENT PLACEMENT Right 12/25/2020   Procedure: CYSTOSCOPY WITH RETROGRADE PYELOGRAM/URETERAL STENT PLACEMENT;  Surgeon: Selma Donnice SAUNDERS, MD;  Location: [facility];  Service: Urology;  Laterality: Right;   CYSTOSCOPY/URETEROSCOPY/HOLMIUM LASER/STENT PLACEMENT Right 01/15/2021   Procedure:  CYSTOSCOPY/RETROGRADE/URETEROSCOPY/HOLMIUM LASER/STENT EXCHANGE;  Surgeon: Selma Donnice SAUNDERS, MD;  Location: [facility];  Service: Urology;  Laterality: Right;   ESOPHAGOGASTRODUODENOSCOPY N/A 12/08/2019   normal esophagus with 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   ESOPHAGOGASTRODUODENOSCOPY (EGD) WITH PROPOFOL  N/A 05/18/2021   Surgeon: Magod, Marc, MD;   Tiny hiatal hernia, gastric antral vascular ectasia s/p ablation, normal duodenum, normal jejunum.   ESOPHAGOGASTRODUODENOSCOPY (EGD) WITH PROPOFOL  N/A 03/10/2022   Procedure: ESOPHAGOGASTRODUODENOSCOPY (EGD) WITH PROPOFOL ;  Surgeon: Cindie Carlin POUR, DO;  [facility];  Service: Endoscopy;  Laterality: N/A;  10:45am   ESOPHAGOGASTRODUODENOSCOPY (EGD) WITH PROPOFOL  N/A 02/01/2023   Procedure: ESOPHAGOGASTRODUODENOSCOPY (EGD) WITH PROPOFOL ;  Surgeon: Cindie Carlin POUR, DO;  [facility];  Service: Endoscopy;  Laterality: N/A;  9:00 am, asa 3   EYE SURGERY  2015   Cataracts   GIVENS CAPSULE STUDY N/A 01/13/2020   Procedure: GIVENS CAPSULE STUDY;  Surgeon: Shaaron Lamar HERO, MD;  [facility];  Service: Endoscopy;  Laterality: N/A;  7:30am   GIVENS CAPSULE STUDY N/A 05/04/2021   Procedure: GIVENS CAPSULE STUDY;  Surgeon: Rosalie Kitchens, MD;  [facility]: [facility];  Service: Endoscopy;  Laterality: N/A;   JOINT REPLACEMENT  2012 2015   Hips   LEFT ATRIAL APPENDAGE OCCLUSION N/A 06/15/2023   Procedure: LEFT ATRIAL APPENDAGE OCCLUSION;  Surgeon: Cindie Ole DASEN, MD;  Location: [facility];  Service: Cardiovascular;  Laterality: N/A;   MULTIPLE TOOTH EXTRACTIONS     60's   PARATHYROIDECTOMY     POLYPECTOMY  12/08/2019   Procedure: POLYPECTOMY;  Surgeon: Shaaron Lamar HERO, MD;  [facility];  Service: Endoscopy;;   POLYPECTOMY  02/01/2023   Procedure: POLYPECTOMY;  Surgeon: Cindie Carlin POUR, DO;  [facility];  Service: Endoscopy;;   RADIOFREQUENCY ABLATION   05/18/2021   Procedure: RADIO FREQUENCY ABLATION;  Surgeon: Rosalie Kitchens, MD;  [facility];  Service: Endoscopy;;   TEE WITHOUT CARDIOVERSION N/A 07/06/2021   Procedure: TRANSESOPHAGEAL ECHOCARDIOGRAM (TEE);  Surgeon: Kate Lonni CROME, MD;  Location[facility] ENDOSCOPY;  Service: Cardiovascular;  Laterality: N/A;   TEE WITHOUT CARDIOVERSION N/A 06/15/2023   Procedure: TRANSESOPHAGEAL ECHOCARDIOGRAM;  Surgeon: Cindie Ole DASEN, MD;  Location: [facility] INVASIVE CV LAB;  Service: Cardiovascular;  Laterality: N/A;   TOTAL HIP ARTHROPLASTY Right 10/29/2013   Procedure: RIGHT TOTAL HIP ARTHROPLASTY ANTERIOR APPROACH;  Surgeon: Donnice JONETTA Car, MD;  Location: [facility];  Service: Orthopedics;  Laterality: Right;   TRANSESOPHAGEAL ECHOCARDIOGRAM (CATH LAB) N/A 08/18/2023   Procedure: TRANSESOPHAGEAL ECHOCARDIOGRAM;  Surgeon: Francyne Headland, MD;  Location: [facility];  Service: Cardiovascular;  Laterality: N/A;   TUBAL LIGATION  1972    Prior to Admission medications   Medication Sig Start Date End Date Taking? Authorizing Provider  acetaminophen  (TYLENOL ) 325 MG tablet Take 2 tablets (650 mg total) by mouth every 6 (six) hours as needed for mild pain or headache (or Fever >/= 101). 04/04/19  Yes Pearlean Manus, MD  allopurinol  (ZYLOPRIM ) 100  MG tablet Take 1 tablet by mouth once daily 03/29/24  Yes Joesph Annabella HERO, FNP  apixaban  (ELIQUIS ) 5 MG TABS tablet Take 1 tablet (5 mg total) by mouth 2 (two) times daily. 02/14/24  Yes Joesph Annabella HERO, FNP  atorvastatin  (LIPITOR) 20 MG tablet Take 1 tablet (20 mg total) by mouth daily. 11/24/23  Yes Joesph Annabella HERO, FNP  cetirizine  (ZYRTEC ) 10 MG tablet Take 10 mg by mouth daily.   Yes [provider]  dapagliflozin  propanediol (FARXIGA ) 10 MG TABS tablet Take 1 tablet (10 mg total) by mouth daily. 09/22/23  Yes Joesph Annabella HERO, FNP  ferrous sulfate  325 (65 FE) MG EC tablet Take 325 mg by mouth daily.   Yes [provider]   furosemide  (LASIX ) 20 MG tablet Take 1 tablet (20 mg total) by mouth daily as needed for fluid or edema. 01/19/24  Yes Cheryle Page, MD  GNP CRANBERRY EXTRACT PO Take 1 tablet by mouth in the morning.   Yes [provider]  levothyroxine  (SYNTHROID ) 50 MCG tablet Take 1 tablet (50 mcg total) by mouth daily before breakfast. 11/24/23  Yes Joesph Annabella HERO, FNP  metoprolol  succinate (TOPROL -XL) 50 MG 24 hr tablet Take 3 tablets (150 mg total) by mouth daily. 02/16/24  Yes Joesph Annabella HERO, FNP  oxybutynin  (DITROPAN -XL) 10 MG 24 hr tablet Take 1 tablet (10 mg total) by mouth at bedtime. 04/05/24  Yes Joesph Annabella HERO, FNP  pantoprazole  (PROTONIX ) 40 MG tablet Take 1 tablet (40 mg total) by mouth 2 (two) times daily before a meal. 11/24/23  Yes Joesph Annabella HERO, FNP  polyethylene glycol powder (GLYCOLAX /MIRALAX ) 17 GM/SCOOP powder Take 1 capful (17 g) by mouth daily as needed for moderate constipation. 01/19/24  Yes Cheryle Page, MD    Current Facility-Administered Medications  Medication Dose Route Frequency Provider Last Rate Last Admin   0.9 %  sodium chloride  infusion  250 mL Intravenous PRN Sundil, Subrina, MD       acetaminophen  (TYLENOL ) tablet 650 mg  650 mg Oral Q6H PRN Sundil, Subrina, MD       Or   acetaminophen  (TYLENOL ) suppository 650 mg  650 mg Rectal Q6H PRN Sundil, Subrina, MD       levothyroxine  (SYNTHROID ) tablet 50 mcg  50 mcg Oral QAC breakfast Sundil, Subrina, MD   50 mcg at 04/20/24 0507   metoprolol  succinate (TOPROL -XL) 24 hr tablet 150 mg  150 mg Oral Daily Odell Celinda Balo, MD   150 mg at 04/20/24 0932   ondansetron  (ZOFRAN ) tablet 4 mg  4 mg Oral Q6H PRN Sundil, Subrina, MD       Or   ondansetron  (ZOFRAN ) injection 4 mg  4 mg Intravenous Q6H PRN Sundil, Subrina, MD       oxybutynin  (DITROPAN -XL) 24 hr tablet 10 mg  10 mg Oral QHS Sundil, Subrina, MD   10 mg at 04/20/24 0141   pantoprazole  (PROTONIX ) injection 40 mg  40 mg Intravenous Q12H Sundil, Subrina,  MD   40 mg at 04/20/24 9387   sodium chloride  flush (NS) 0.9 % injection 3 mL  3 mL Intravenous Q12H Sundil, Subrina, MD   3 mL at 04/20/24 9066   sodium chloride  flush (NS) 0.9 % injection 3 mL  3 mL Intravenous PRN Sundil, Subrina, MD       Current Outpatient Medications  Medication Sig Dispense Refill   acetaminophen  (TYLENOL ) 325 MG tablet Take 2 tablets (650 mg total) by mouth every 6 (six) hours as  needed for mild pain or headache (or Fever >/= 101). 12 tablet 0   allopurinol  (ZYLOPRIM ) 100 MG tablet Take 1 tablet by mouth once daily 90 tablet 0   apixaban  (ELIQUIS ) 5 MG TABS tablet Take 1 tablet (5 mg total) by mouth 2 (two) times daily. 60 tablet 3   atorvastatin  (LIPITOR) 20 MG tablet Take 1 tablet (20 mg total) by mouth daily. 90 tablet 3   cetirizine  (ZYRTEC ) 10 MG tablet Take 10 mg by mouth daily.     dapagliflozin  propanediol (FARXIGA ) 10 MG TABS tablet Take 1 tablet (10 mg total) by mouth daily. 90 tablet 5   ferrous sulfate  325 (65 FE) MG EC tablet Take 325 mg by mouth daily.     furosemide  (LASIX ) 20 MG tablet Take 1 tablet (20 mg total) by mouth daily as needed for fluid or edema.     GNP CRANBERRY EXTRACT PO Take 1 tablet by mouth in the morning.     levothyroxine  (SYNTHROID ) 50 MCG tablet Take 1 tablet (50 mcg total) by mouth daily before breakfast. 90 tablet 2   metoprolol  succinate (TOPROL -XL) 50 MG 24 hr tablet Take 3 tablets (150 mg total) by mouth daily. 270 tablet 3   oxybutynin  (DITROPAN -XL) 10 MG 24 hr tablet Take 1 tablet (10 mg total) by mouth at bedtime. 90 tablet 0   pantoprazole  (PROTONIX ) 40 MG tablet Take 1 tablet (40 mg total) by mouth 2 (two) times daily before a meal. 180 tablet 0   polyethylene glycol powder (GLYCOLAX /MIRALAX ) 17 GM/SCOOP powder Take 1 capful (17 g) by mouth daily as needed for moderate constipation. 238 g 0    Allergies as of 04/19/2024   (No Known Allergies)    Family History  Problem Relation Age of Onset   Colitis Sister 90        alive   Cancer Sister 53       colon   Cancer Mother 81       uterine   Heart disease Father 60       heart failure   Heart attack Brother 67   Healthy Daughter    Healthy Son    Pulmonary embolism Brother 46   Heart disease Brother    Healthy Son    Healthy Son     Social History   Socioeconomic History   Marital status: Widowed    Spouse name: Not on file   Number of children: 4   Years of education: 76   Highest education level: 12th grade  Occupational History   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 She has four adult children and one son lives with her.       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   Social Drivers of Corporate investment banker Strain: Low Risk  (01/23/2024)   Overall Financial Resource Strain (CARDIA)    Difficulty of Paying Living Expenses: Not very hard  Food Insecurity: No Food Insecurity (01/23/2024)   Hunger Vital Sign    Worried About Running Out of  Food in the Last Year: Never true    Ran Out of Food in the Last Year: Never true  Transportation Needs: No Transportation Needs (01/23/2024)   PRAPARE - Administrator, Civil Service (Medical): No    Lack of Transportation (Non-Medical): No  Physical Activity: Insufficiently Active (01/23/2024)   Exercise Vital Sign    Days of Exercise per Week: 1 day    Minutes of Exercise per Session: 10 min  Stress: No Stress Concern Present (11/23/2023)   [employer] of Occupational Health - Occupational Stress Questionnaire    Feeling of Stress : Not at all  Social Connections: Moderately Isolated (01/23/2024)   Social Connection and  Isolation Panel    Frequency of Communication with Friends and Family: More than three times a week    Frequency of Social Gatherings with Friends and Family: Twice a week    Attends Religious Services: More than 4 times per year    Active Member of [employer] or Organizations: No    Attends Banker Meetings: Not on file    Marital Status: Widowed  Intimate Partner Violence: Not At Risk (01/22/2024)   Humiliation, Afraid, Rape, and Kick questionnaire    Fear of Current or Ex-Partner: No    Emotionally Abused: No    Physically Abused: No    Sexually Abused: No    Review of Systems: Pertinent positive and negative review of systems were noted in the above HPI section.  All other review of systems was otherwise negative.   Physical Exam: Vital signs in last 24 hours: Temp:  [97.6 F (36.4 C)-98.2 F (36.8 C)] 98.2 F (36.8 C) (09/27 0723) Pulse Rate:  [45-150] 97 (09/27 0934) Resp:  [16-27] 16 (09/27 0934) BP: (102-137)/(45-95) 130/79 (09/27 0934) SpO2:  [93 %-100 %] 99 % (09/27 0934) Weight:  [87.5 kg] 87.5 kg (09/26 2138)   General:   Alert,  Well-developed, well-nourished, elderly white female pleasant and cooperative in NAD, Head:  Normocephalic and atraumatic. Eyes:  Sclera clear, no icterus.   Conjunctiva pink. Ears:  Normal auditory acuity. Nose:  No deformity, discharge,  or lesions. Mouth:  No deformity or lesions.   Neck:  Supple; no masses or thyromegaly. Lungs:  Clear throughout to auscultation.   No wheezes, crackles, or rhonchi . Heart:  Regular rate and rhythm; no murmurs, clicks, rubs,  or gallops. Abdomen:  Soft,nontender, BS active,nonpalp mass or hsm.   Rectal: Not done, stool documented Hemoccult positive Msk:  Symmetrical without gross deformities. . Pulses:  Normal pulses noted. Extremities:  Without clubbing or edema. Neurologic:  Alert and  oriented x4;  grossly normal neurologically. Skin:  Intact without significant lesions or  rashes.. Psych:  Alert and cooperative. Normal mood and affect.  Intake/Output from previous day: 09/26 0701 - 09/27 0700 In: 668 [Blood:668] Out: -  Intake/Output this shift: No intake/output data recorded.  Lab Results: Recent Labs    04/19/24 1632 04/19/24 2149 04/19/24 2151 04/20/24 0722  WBC 5.5 5.2  --  5.7  HGB 4.6* 4.3* 5.4* 8.0*  HCT 17.7* 16.9* 16.0* 26.9*  PLT 332 301  --  265   BMET Recent Labs    04/17/24 1205 04/19/24 2149 04/19/24 2151 04/20/24 0722  NA 142 138 140 140  K 4.7 4.0 4.0 3.5  CL 110* 110 110 109  CO2 15* 18*  --  20*  GLUCOSE 97 172* 169* 105*  BUN 30* 43* 39* 36*  CREATININE 1.29* 1.48* 1.60* 1.40*  CALCIUM  11.0* 10.8*  --  10.7*   LFT Recent Labs    04/20/24 0722  PROT 5.2*  ALBUMIN  3.2*  AST 14*  ALT 8  ALKPHOS 67  BILITOT 1.4*   PT/INR No results for input(s): LABPROT, INR in the last 72 hours. Hepatitis Panel No results for input(s): HEPBSAG, HCVAB, HEPAIGM, HEPBIGM in the last 72 hours.   IMPRESSION:  #90 83 year old white female directed to the ER after outpatient labs done by cardiology after she was seen with complaints of progressive shortness of breath yesterday with finding of hemoglobin of 4.6.  Hemoglobin was 11.3 about 2 months ago. Stool documented Hemoccult positive, patient has no history of melena or hematochezia and has not noticed any changes in her bowels over the past couple of months. Had noticed increased fatigue and then shortness of breath over the past couple of weeks. Patient has known GAVE (gastric antral vascular ectasia) which has required treatment with APC on several occasions in the past.  Last EGD was done in July 2024 with treatment for GAVE.  Very likely she has had recurrent subacute GI blood loss secondary to GAVE in setting of chronic anticoagulation with Eliquis   #2 recent bilateral PEs June 2025-on Eliquis  #3.  History of atrial fibrillation status post Watchman with  persistent atrial fibrillation #4 diabetes mellitus #5.  Congestive heart failure #6.  Chronic kidney disease stage III #7.  Diabetes mellitus #8 history of adenomatous colon polyps-status post colonoscopy 2021  PLAN: Regular diet today, n.p.o. after midnight Continue to hold Eliquis  Continue to trend hemoglobin and transfuse as indicated Will check iron studies Will be scheduled for EGD tomorrow morning with Dr. Albertus 04/21/2024 with treatment of GAVE.  Procedure was discussed in detail with the patient including indications risk and benefits and she is agreeable to proceed. GI will follow with you, given recent PEs, we should be able to resume Eliquis  quickly post procedure   Yosiah Jasmin PA-C 04/20/2024, 10:15 AM

## 2024-04-20 NOTE — ED Notes (Signed)
 Rolling call given to [facility].

## 2024-04-20 NOTE — ED Notes (Signed)
 Patient completed 3rd unit PRBC with no adverse effect.

## 2024-04-20 NOTE — ED Notes (Signed)
 2nd unit PRBC infusing with no adverse effect , VSS/respirations unlabored , afebrile , IV site unremarkable , waiting for in-patient bed assignment.

## 2024-04-20 NOTE — Plan of Care (Signed)

## 2024-04-20 NOTE — ED Notes (Addendum)
 2nd unit PRBC completed with no adverse effect , respirations unlabored , IV site intact . VSS/Afebrile.

## 2024-04-20 NOTE — ED Notes (Signed)
 Pt transferred onto hospital bed for comfort.

## 2024-04-21 ENCOUNTER — Ambulatory Visit: Payer: Self-pay | Admitting: Cardiology

## 2024-04-21 ENCOUNTER — Encounter (HOSPITAL_COMMUNITY): Payer: Self-pay | Admitting: Internal Medicine

## 2024-04-21 ENCOUNTER — Inpatient Hospital Stay (HOSPITAL_COMMUNITY): Admitting: Anesthesiology

## 2024-04-21 ENCOUNTER — Encounter (HOSPITAL_COMMUNITY)
Admission: EM | Disposition: A | Discharge status: Home / Self Care | Payer: Self-pay | Source: Ambulatory Visit | Attending: Internal Medicine

## 2024-04-21 DIAGNOSIS — I10 Essential (primary) hypertension: Secondary | ICD-10-CM | POA: Diagnosis not present

## 2024-04-21 DIAGNOSIS — D500 Iron deficiency anemia secondary to blood loss (chronic): Secondary | ICD-10-CM | POA: Diagnosis not present

## 2024-04-21 DIAGNOSIS — I132 Hypertensive heart and chronic kidney disease with heart failure and with stage 5 chronic kidney disease, or end stage renal disease: Secondary | ICD-10-CM | POA: Diagnosis not present

## 2024-04-21 DIAGNOSIS — N1831 Chronic kidney disease, stage 3a: Secondary | ICD-10-CM

## 2024-04-21 DIAGNOSIS — I5020 Unspecified systolic (congestive) heart failure: Secondary | ICD-10-CM | POA: Diagnosis not present

## 2024-04-21 DIAGNOSIS — K31811 Angiodysplasia of stomach and duodenum with bleeding: Secondary | ICD-10-CM

## 2024-04-21 DIAGNOSIS — R195 Other fecal abnormalities: Secondary | ICD-10-CM | POA: Diagnosis not present

## 2024-04-21 DIAGNOSIS — D649 Anemia, unspecified: Secondary | ICD-10-CM | POA: Diagnosis not present

## 2024-04-21 HISTORY — PX: ESOPHAGOGASTRODUODENOSCOPY: SHX5428

## 2024-04-21 LAB — CBC
HCT: 26.9 % — ABNORMAL LOW (ref 36.0–46.0)
HCT: 27.4 % — ABNORMAL LOW (ref 36.0–46.0)
Hemoglobin: 7.9 g/dL — ABNORMAL LOW (ref 12.0–15.0)
Hemoglobin: 7.9 g/dL — ABNORMAL LOW (ref 12.0–15.0)
MCH: 25.9 pg — ABNORMAL LOW (ref 26.0–34.0)
MCH: 25.6 pg — ABNORMAL LOW (ref 26.0–34.0)
MCHC: 29.4 g/dL — ABNORMAL LOW (ref 30.0–36.0)
MCHC: 28.8 g/dL — ABNORMAL LOW (ref 30.0–36.0)
MCV: 88.2 fL (ref 80.0–100.0)
MCV: 88.7 fL (ref 80.0–100.0)
Platelets: 255 K/uL (ref 150–400)
Platelets: 252 K/uL (ref 150–400)
RBC: 3.05 MIL/uL — ABNORMAL LOW (ref 3.87–5.11)
RBC: 3.09 MIL/uL — ABNORMAL LOW (ref 3.87–5.11)
RDW: 17.2 % — ABNORMAL HIGH (ref 11.5–15.5)
RDW: 17.4 % — ABNORMAL HIGH (ref 11.5–15.5)
WBC: 5.7 K/uL (ref 4.0–10.5)
WBC: 6.3 K/uL (ref 4.0–10.5)
nRBC: 0 % (ref 0.0–0.2)
nRBC: 0 % (ref 0.0–0.2)

## 2024-04-21 LAB — BASIC METABOLIC PANEL WITH GFR
Anion gap: 10 (ref 5–15)
BUN: 34 mg/dL — ABNORMAL HIGH (ref 8–23)
CO2: 18 mmol/L — ABNORMAL LOW (ref 22–32)
Calcium: 10.5 mg/dL — ABNORMAL HIGH (ref 8.9–10.3)
Chloride: 113 mmol/L — ABNORMAL HIGH (ref 98–111)
Creatinine, Ser: 1.46 mg/dL — ABNORMAL HIGH (ref 0.44–1.00)
GFR, Estimated: 36 mL/min — ABNORMAL LOW (ref >60)
Glucose, Bld: 102 mg/dL — ABNORMAL HIGH (ref 70–99)
Potassium: 3.9 mmol/L (ref 3.5–5.1)
Sodium: 141 mmol/L (ref 135–145)

## 2024-04-21 SURGERY — EGD (ESOPHAGOGASTRODUODENOSCOPY)
Anesthesia: Monitor Anesthesia Care

## 2024-04-21 MED ORDER — SODIUM CHLORIDE 0.9 % IV SOLN: INTRAVENOUS | Status: DC | PRN

## 2024-04-21 MED ORDER — SODIUM CHLORIDE 0.9 % IV SOLN
INTRAVENOUS | Status: AC | PRN
  Administered 2024-04-21: 500 mL via INTRAMUSCULAR

## 2024-04-21 MED ORDER — PHENYLEPHRINE HCL (PRESSORS) 10 MG/ML IV SOLN
INTRAVENOUS | Status: DC | PRN
  Administered 2024-04-21 (×2): 80 ug via INTRAVENOUS

## 2024-04-21 MED ORDER — SODIUM CHLORIDE 0.9 % IV SOLN
200.0000 mg | Freq: Once | INTRAVENOUS | Status: AC
  Administered 2024-04-21: 200 mg via INTRAVENOUS
  Filled 2024-04-21: qty 10

## 2024-04-21 MED ORDER — IRON SUCROSE 200 MG IVPB - SIMPLE MED
200.0000 mg | Freq: Once | Status: DC
  Filled 2024-04-21: qty 110

## 2024-04-21 MED ORDER — METOPROLOL TARTRATE 5 MG/5ML IV SOLN
5.0000 mg | Freq: Once | INTRAVENOUS | Status: AC
  Administered 2024-04-21: 5 mg via INTRAVENOUS
  Filled 2024-04-21: qty 5

## 2024-04-21 MED ORDER — PROPOFOL 500 MG/50ML IV EMUL
INTRAVENOUS | Status: DC | PRN
  Administered 2024-04-21: 100 ug/kg/min via INTRAVENOUS

## 2024-04-21 MED ORDER — PROPOFOL 10 MG/ML IV BOLUS
INTRAVENOUS | Status: DC | PRN
  Administered 2024-04-21: 20 mg via INTRAVENOUS

## 2024-04-21 MED ORDER — LIDOCAINE HCL (PF) 2 % IJ SOLN
INTRAMUSCULAR | Status: DC | PRN
Start: 2024-04-21 — End: 2024-04-21
  Administered 2024-04-21: 40 mg via INTRADERMAL

## 2024-04-21 NOTE — Plan of Care (Signed)

## 2024-04-21 NOTE — Anesthesia Postprocedure Evaluation (Signed)
 Anesthesia Post Note  Patient: [name] [name]  Procedure(s) Performed: EGD (ESOPHAGOGASTRODUODENOSCOPY)     Patient location during evaluation: PACU Anesthesia Type: MAC Level of consciousness: awake and alert Pain management: pain level controlled Vital Signs Assessment: post-procedure vital signs reviewed and stable Respiratory status: spontaneous breathing, nonlabored ventilation, respiratory function stable and patient connected to nasal cannula oxygen  Cardiovascular status: stable and blood pressure returned to baseline Postop Assessment: no apparent nausea or vomiting Anesthetic complications: no   No notable events documented.  Last Vitals:  Vitals:   04/21/24 0930 04/21/24 0940  BP: 104/73 119/69  Pulse: 96 90  Resp: 14 19  Temp:    SpO2: 92% 95%    Last Pain:  Vitals:   04/21/24 0940  TempSrc:   PainSc: 0-No pain                 Lynwood MARLA Cornea

## 2024-04-21 NOTE — Op Note (Signed)
 [facility] Patient Name: [name] [name] Procedure Date : 04/21/2024 MRN: [id number] Attending MD: Gordy CHRISTELLA Starch , MD, [id number] Date of Birth: 1941/07/01 CSN: [id number] Age: 83 Admit Type: Inpatient Procedure:                Upper GI endoscopy Indications:              Iron deficiency anemia secondary to chronic blood                            loss, Heme positive stool, Watermelon stomach (GAVE                            syndrome) - last treated with APC in July 2024.                            chronic DOAC (due to hx of PE, previously placed                            Watchman device in place for afib) Providers:                Gordy CHRISTELLA. Starch, MD, Jasmine Petiford, Technician,                            Danielle D,CRNA, Robie Breed, RN Referring MD:             [facility] Medicines:                Monitored Anesthesia Care Complications:            No immediate complications. Estimated Blood Loss:     Estimated blood loss: none. Procedure:                Pre-Anesthesia Assessment:                           - Prior to the procedure, a History and Physical                            was performed, and patient medications and                            allergies were reviewed. The patient's tolerance of                            previous anesthesia was also reviewed. The risks                            and benefits of the procedure and the sedation                            options and risks were discussed with the patient.                            All questions were answered, and informed consent  was obtained. Prior Anticoagulants: The patient has                            taken Eliquis  (apixaban ), last dose was 2 days                            prior to procedure. ASA Grade Assessment: III - A                            patient with severe systemic disease. After                            reviewing the risks and  benefits, the patient was                            deemed in satisfactory condition to undergo the                            procedure.                           After obtaining informed consent, the endoscope was                            passed under direct vision. Throughout the                            procedure, the patient's blood pressure, pulse, and                            oxygen  saturations were monitored continuously. The                            [id number]-H190 ([id number]) Olympus endoscope was introduced                            through the mouth, and advanced to the second part                            of duodenum. The upper GI endoscopy was                            accomplished without difficulty. The patient                            tolerated the procedure well. Scope In: Scope Out: Findings:      The examined esophagus was normal.      Moderate gastric antral vascular ectasia with bleeding was present in       the gastric antrum. The GAVE is nodular in nature. Fulguration to stop       the bleeding and ablate ectasias by argon plasma at 1 liter/minute and       20 watts was successful.      The examined duodenum was normal. Impression:               -  Normal esophagus.                           - Nodular gastric antral vascular ectasia with                            bleeding. Treated with argon plasma coagulation                            (APC).                           - Normal examined duodenum.                           - No specimens collected. Moderate Sedation:      N/A Recommendation:           - Return patient to hospital ward for ongoing care.                           - Advance diet as tolerated.                           - Continue present medications. Would hold DOAC 48                            hours.                           - BID PPI.                           - IV iron dose x 1 ordered here. I placed consult                             for completion of IV iron therapy for IDA secondary                            to GAVE causing chronic GI bleeding.                           - Follow-up with Dr. Shaaron at [facility]. Hgb                            and iron studies should be followed serially. If                            refractory anemia or iron deficiency then consult                            to Dr. Wilhelmenia ([facility]) for possible band                            ligation of nodular GAVE is recommended.                           -  GI will sign off. Call if questions. I will                            message [facility] for follow-up. Procedure Code(s):        --- Professional ---                           [id number], Esophagogastroduodenoscopy, flexible,                            transoral; with control of bleeding, any method Diagnosis Code(s):        --- Professional ---                           K31.811, Angiodysplasia of stomach and duodenum                            with bleeding                           D50.0, Iron deficiency anemia secondary to blood                            loss (chronic)                           R19.5, Other fecal abnormalities CPT copyright 2022 American [employer] Association. All rights reserved. The codes documented in this report are preliminary and upon coder review may  be revised to meet current compliance requirements. Gordy CHRISTELLA Starch, MD 04/21/2024 9:26:58 AM This report has been signed electronically. Number of Addenda: 0

## 2024-04-21 NOTE — Progress Notes (Signed)
 PROGRESS NOTE        PATIENT DETAILS Name: [name] [name] Age: 83 y.o. Sex: female Date of Birth: [birth year] Admit Date: 04/19/2024 Admitting Physician Micaela Speaker, MD ERE:[id number], Annabella HERO, FNP  Brief Summary: Patient is a 83 y.o.  female with history of A-fib-s/p watchman's device November 2024, second episode of VTE (PE/DVT) in June 2025-on Eliquis , prior history of GI bleed secondary to GAVE-presented with fatigue/exertional dyspnea-found to have profound anemia.  Significant events: 9/27>> admit to [facility]  Significant studies: 9/26>> CXR: No PNA.  Significant microbiology data: None  Procedures: None  Consults: GI  Subjective: Lying comfortably in bed-denies any chest pain or shortness of breath.  Has not had a bowel movement since hospitalization.  Dark but not black stools at home.  Objective: Vitals: Blood pressure 126/86, pulse 85, temperature 97.7 F (36.5 C), temperature source Temporal, resp. rate (!) 22, height 5' 3 (1.6 m), weight 87.5 kg, SpO2 96%.   Exam: Gen Exam:Alert awake-not in any distress HEENT:atraumatic, normocephalic Chest: B/L clear to auscultation anteriorly CVS:S1S2 regular Abdomen:soft non tender, non distended Extremities:no edema Neurology: Non focal Skin: no rash  Pertinent Labs/Radiology:    Latest Ref Rng & Units 04/21/2024    6:54 AM 04/20/2024    5:41 PM 04/20/2024    3:04 PM  CBC  WBC 4.0 - 10.5 K/uL 5.7  6.2  5.9   Hemoglobin 12.0 - 15.0 g/dL 7.9  8.3  8.1   Hematocrit 36.0 - 46.0 % 26.9  28.2  26.9   Platelets 150 - 400 K/uL 255  296  268     Lab Results  Component Value Date   NA 141 04/21/2024   K 3.9 04/21/2024   CL 113 (H) 04/21/2024   CO2 18 (L) 04/21/2024      Assessment/Plan: Severe symptomatic anemia-likely due to chronic blood loss-probably slow upper GI bleed in the setting of known GAVE-and being on Eliquis . Hb stable after 2 units of PRBC Remains on PPI GI  following-EGD later today.  History of persistent atrial fibrillation-s/p watchman's device November 2024 Continue beta-blocker Telemetry monitoring  History of recurrent VTE (second episode of DVT/PE June 2025) Ideally needs to be on lifelong anticoagulation-but given history of GAVE/GI bleeding-this may need to be reconsidered Eliquis  on hold (started in June 2025) Await EGD results  Chronic HFmrEF Euvolemic on exam Diuretics on hold  HTN BP stable Toprol   CKD stage IIIa Close to baseline  Hypothyroidism Synthroid .  Class 1 Obesity: Estimated body mass index is 34.17 kg/m as calculated from the following:   Height as of this encounter: 5' 3 (1.6 m).   Weight as of this encounter: 87.5 kg.   Code status:   Code Status: Full Code   DVT Prophylaxis: SCDs Start: 04/20/24 0118 Place TED hose Start: 04/20/24 0118   Family Communication: None at bedside   Disposition Plan: Status is: Inpatient Remains inpatient appropriate because: Severity of illness   Planned Discharge Destination:Home   Diet: Diet Order             Diet NPO time specified Except for: Sips with Meds  Diet effective midnight                     Antimicrobial agents: Anti-infectives (From admission, onward)    None        MEDICATIONS:  Scheduled Meds:  [MAR Hold] levothyroxine   50 mcg Oral QAC breakfast   [MAR Hold] metoprolol  succinate  150 mg Oral Daily   [MAR Hold] oxybutynin   10 mg Oral QHS   [MAR Hold] pantoprazole  (PROTONIX ) IV  40 mg Intravenous Q12H   [MAR Hold] sodium chloride  flush  3 mL Intravenous Q12H   Continuous Infusions:  sodium chloride  500 mL (04/21/24 0832)   PRN Meds:.sodium chloride , [MAR Hold] acetaminophen  **OR** [MAR Hold] acetaminophen , [MAR Hold] ondansetron  **OR** [MAR Hold] ondansetron  (ZOFRAN ) IV, [MAR Hold] sodium chloride  flush   I have personally reviewed following labs and imaging studies  LABORATORY DATA: CBC: Recent Labs  Lab  04/19/24 2149 04/19/24 2151 04/20/24 0722 04/20/24 1504 04/20/24 1741 04/21/24 0654  WBC 5.2  --  5.7 5.9 6.2 5.7  NEUTROABS 3.5  --   --   --   --   --   HGB 4.3* 5.4* 8.0* 8.1* 8.3* 7.9*  HCT 16.9* 16.0* 26.9* 26.9* 28.2* 26.9*  MCV 86.2  --  87.6 87.6 87.6 88.2  PLT 301  --  265 268 296 255    Basic Metabolic Panel: Recent Labs  Lab 04/17/24 1205 04/19/24 2149 04/19/24 2151 04/20/24 0722 04/21/24 0654  NA 142 138 140 140 141  K 4.7 4.0 4.0 3.5 3.9  CL 110* 110 110 109 113*  CO2 15* 18*  --  20* 18*  GLUCOSE 97 172* 169* 105* 102*  BUN 30* 43* 39* 36* 34*  CREATININE 1.29* 1.48* 1.60* 1.40* 1.46*  CALCIUM  11.0* 10.8*  --  10.7* 10.5*    GFR: Estimated Creatinine Clearance: 31.1 mL/min (A) (by C-G formula based on SCr of 1.46 mg/dL (H)).  Liver Function Tests: Recent Labs  Lab 04/19/24 2149 04/20/24 0722  AST 12* 14*  ALT 9 8  ALKPHOS 72 67  BILITOT 0.5 1.4*  PROT 5.3* 5.2*  ALBUMIN  3.3* 3.2*   No results for input(s): LIPASE, AMYLASE in the last 168 hours. No results for input(s): AMMONIA in the last 168 hours.  Coagulation Profile: No results for input(s): INR, PROTIME in the last 168 hours.  Cardiac Enzymes: No results for input(s): CKTOTAL, CKMB, CKMBINDEX, TROPONINI in the last 168 hours.  BNP (last 3 results) Recent Labs    04/19/24 1632  PROBNP 3,865*    Lipid Profile: No results for input(s): CHOL, HDL, LDLCALC, TRIG, CHOLHDL, LDLDIRECT in the last 72 hours.  Thyroid  Function Tests: No results for input(s): TSH, T4TOTAL, FREET4, T3FREE, THYROIDAB in the last 72 hours.  Anemia Panel: Recent Labs    04/20/24 1504  FERRITIN 14  TIBC 412  IRON <10*  RETICCTPCT 2.1    Urine analysis:    Component Value Date/Time   COLORURINE YELLOW 01/14/2024 1018   APPEARANCEUR Cloudy (A) 02/14/2024 1215   LABSPEC 1.016 01/14/2024 1018   PHURINE 5.5 01/14/2024 1018   GLUCOSEU Trace (A) 02/14/2024 1215    HGBUR NEGATIVE 01/14/2024 1018   BILIRUBINUR Negative 02/14/2024 1215   KETONESUR NEGATIVE 01/14/2024 1018   PROTEINUR 1+ (A) 02/14/2024 1215   PROTEINUR TRACE (A) 01/14/2024 1018   UROBILINOGEN 0.2 10/22/2013 1127   NITRITE Positive (A) 02/14/2024 1215   NITRITE NEGATIVE 01/14/2024 1018   LEUKOCYTESUR 3+ (A) 02/14/2024 1215   LEUKOCYTESUR MODERATE (A) 01/14/2024 1018    Sepsis Labs: Lactic Acid, Venous    Component Value Date/Time   LATICACIDVEN 1.1 02/09/2022 2301    MICROBIOLOGY: No results found for this or any previous visit (from the past 240  hours).  RADIOLOGY STUDIES/RESULTS: DG Chest Portable 1 View Result Date: 04/19/2024 EXAM: 1 VIEW(S) XRAY OF THE CHEST 04/19/2024 09:53:51 PM COMPARISON: Comparison with radiograph of 04/17/2024. CLINICAL HISTORY: SHOB. FINDINGS: LUNGS AND PLEURA: No focal pulmonary opacity. No pulmonary edema. No pleural effusion. No pneumothorax. HEART AND MEDIASTINUM: Left atrial appendage closure device. Stable enlargement of the cardiomediastinal silhouette. Aortic atherosclerotic calcification. BONES AND SOFT TISSUES: No acute osseous abnormality. IMPRESSION: 1. No acute cardiopulmonary abnormality. Electronically signed by: Norman Gatlin MD 04/19/2024 10:01 PM EDT RP Workstation: [id number]     LOS: 1 day   Donalda Applebaum, MD  [facility]    To contact the attending provider between 7A-7P or the covering provider during after hours 7P-7A, please log into the web site [email or web address] and access using universal [place] password for that web site. If you do not have the password, please call the hospital operator.  04/21/2024, 9:00 AM

## 2024-04-21 NOTE — Transfer of Care (Signed)
 Immediate Anesthesia Transfer of Care Note  Patient: [name] [name]  Procedure(s) Performed: EGD (ESOPHAGOGASTRODUODENOSCOPY)  Patient Location: Endoscopy Unit  Anesthesia Type:MAC  Level of Consciousness: drowsy and patient cooperative  Airway & Oxygen  Therapy: Patient Spontanous Breathing and Patient connected to face mask oxygen   Post-op Assessment: Report given to RN, Post -op Vital signs reviewed and stable, Patient moving all extremities, and Patient moving all extremities X 4  Post vital signs: Reviewed and stable  Last Vitals:  Vitals Value Taken Time  BP 107/93 04/21/24 09:12  Temp 36.5 C 04/21/24 09:12  Pulse 111 04/21/24 09:12  Resp 26 04/21/24 09:12  SpO2 100 % 04/21/24 09:12  Vitals shown include unfiled device data.  Last Pain:  Vitals:   04/21/24 0912  TempSrc: Temporal  PainSc: Asleep         Complications: No notable events documented.

## 2024-04-21 NOTE — Interval H&P Note (Signed)
 History and Physical Interval Note: For EGD this morning to evaluate symptomatic anemia with heme positive stool and her personal history of GAVE Hemoglobin improved after transfusion Eliquis  has been on hold appropriately The nature of the procedure, as well as the risks, benefits, and alternatives were carefully and thoroughly reviewed with the patient. Ample time for discussion and questions allowed. The patient understood, was satisfied, and agreed to proceed.      Latest Ref Rng & Units 04/21/2024    6:54 AM 04/20/2024    5:41 PM 04/20/2024    3:04 PM  CBC  WBC 4.0 - 10.5 K/uL 5.7  6.2  5.9   Hemoglobin 12.0 - 15.0 g/dL 7.9  8.3  8.1   Hematocrit 36.0 - 46.0 % 26.9  28.2  26.9   Platelets 150 - 400 K/uL 255  296  268      04/21/2024 8:40 AM  [name] [name]  has presented today for surgery, with the diagnosis of GAVE, severe anemia.  The various methods of treatment have been discussed with the patient and family. After consideration of risks, benefits and other options for treatment, the patient has consented to  Procedure(s): EGD (ESOPHAGOGASTRODUODENOSCOPY) (N/A) as a surgical intervention.  The patient's history has been reviewed, patient examined, no change in status, stable for surgery.  I have reviewed the patient's chart and labs.  Questions were answered to the patient's satisfaction.     Gordy HERO Linzee Depaul

## 2024-04-21 NOTE — Anesthesia Preprocedure Evaluation (Addendum)
 Anesthesia Evaluation  Patient identified by MRN, date of birth, ID band Patient awake    Reviewed: Allergy & Precautions, NPO status , Patient's Chart, lab work & pertinent test results  History of Anesthesia Complications Negative for: history of anesthetic complications  Airway Mallampati: II  TM Distance: >3 FB     Dental  (+) Edentulous Upper, Edentulous Lower   Pulmonary shortness of breath, neg COPD, PE (on eliquis )   breath sounds clear to auscultation       Cardiovascular hypertension, +CHF  (-) CAD, (-) Past MI and (-) Cardiac Stents + dysrhythmias Atrial Fibrillation + Valvular Problems/Murmurs MR  Rhythm:Irregular Rate:Normal + Diastolic murmurs Mod-severe MR, mod-severe MS  IMPRESSIONS     1. Left ventricular ejection fraction, by estimation, is 40 to 45%. The  left ventricle has mildly decreased function. The left ventricle  demonstrates global hypokinesis. There is mild left ventricular  hypertrophy. Left ventricular diastolic parameters  are consistent with Grade I diastolic dysfunction (impaired relaxation).   2. Right ventricular systolic function is mildly reduced. The right  ventricular size is normal. Mildly increased right ventricular wall  thickness.   3. Left atrial size was severely dilated.   4. Right atrial size was mildly dilated.   5. The mitral valve is normal in structure. Moderate mitral valve  regurgitation. Moderate to severe mitral stenosis.   6. The aortic valve is tricuspid. Aortic valve regurgitation is not  visualized. Aortic valve sclerosis is present, with no evidence of aortic  valve stenosis.     Neuro/Psych  Headaches, neg Seizures  Neuromuscular disease    GI/Hepatic ,GERD  ,,(+) neg Cirrhosis        Endo/Other  diabetes, Type 2Hypothyroidism    Renal/GU CRFRenal disease     Musculoskeletal  (+) Arthritis ,    Abdominal   Peds  Hematology  (+) Blood dyscrasia,  anemia   Anesthesia Other Findings   Reproductive/Obstetrics                              Anesthesia Physical Anesthesia Plan  ASA: 3  Anesthesia Plan: MAC   Post-op Pain Management:    Induction: Intravenous  PONV Risk Score and Plan: 2  Airway Management Planned: Natural Airway and Nasal Cannula  Additional Equipment:   Intra-op Plan:   Post-operative Plan:   Informed Consent: I have reviewed the patients History and Physical, chart, labs and discussed the procedure including the risks, benefits and alternatives for the proposed anesthesia with the patient or authorized representative who has indicated his/her understanding and acceptance.       Plan Discussed with: CRNA  Anesthesia Plan Comments:         Anesthesia Quick Evaluation

## 2024-04-22 ENCOUNTER — Inpatient Hospital Stay (HOSPITAL_COMMUNITY)

## 2024-04-22 ENCOUNTER — Encounter (HOSPITAL_COMMUNITY): Payer: Self-pay | Admitting: Internal Medicine

## 2024-04-22 DIAGNOSIS — D6832 Hemorrhagic disorder due to extrinsic circulating anticoagulants: Secondary | ICD-10-CM | POA: Diagnosis not present

## 2024-04-22 DIAGNOSIS — D649 Anemia, unspecified: Secondary | ICD-10-CM | POA: Diagnosis not present

## 2024-04-22 DIAGNOSIS — I130 Hypertensive heart and chronic kidney disease with heart failure and stage 1 through stage 4 chronic kidney disease, or unspecified chronic kidney disease: Secondary | ICD-10-CM | POA: Diagnosis not present

## 2024-04-22 DIAGNOSIS — I5020 Unspecified systolic (congestive) heart failure: Secondary | ICD-10-CM | POA: Diagnosis not present

## 2024-04-22 DIAGNOSIS — E039 Hypothyroidism, unspecified: Secondary | ICD-10-CM | POA: Diagnosis not present

## 2024-04-22 DIAGNOSIS — I10 Essential (primary) hypertension: Secondary | ICD-10-CM | POA: Diagnosis not present

## 2024-04-22 DIAGNOSIS — K31811 Angiodysplasia of stomach and duodenum with bleeding: Secondary | ICD-10-CM | POA: Diagnosis not present

## 2024-04-22 DIAGNOSIS — R195 Other fecal abnormalities: Secondary | ICD-10-CM | POA: Diagnosis not present

## 2024-04-22 LAB — CBC
HCT: 27.8 % — ABNORMAL LOW (ref 36.0–46.0)
Hemoglobin: 8 g/dL — ABNORMAL LOW (ref 12.0–15.0)
MCH: 26 pg (ref 26.0–34.0)
MCHC: 28.8 g/dL — ABNORMAL LOW (ref 30.0–36.0)
MCV: 90.3 fL (ref 80.0–100.0)
Platelets: 266 K/uL (ref 150–400)
RBC: 3.08 MIL/uL — ABNORMAL LOW (ref 3.87–5.11)
RDW: 17.6 % — ABNORMAL HIGH (ref 11.5–15.5)
WBC: 7.5 K/uL (ref 4.0–10.5)
nRBC: 0 % (ref 0.0–0.2)

## 2024-04-22 MED ORDER — FENTANYL CITRATE PF 50 MCG/ML IJ SOSY: 6.2500 ug | PREFILLED_SYRINGE | Freq: Once | INTRAMUSCULAR | Status: AC

## 2024-04-22 MED ORDER — APIXABAN 5 MG PO TABS: 5.0000 mg | ORAL_TABLET | Freq: Two times a day (BID) | ORAL | Status: DC

## 2024-04-22 MED ORDER — METOPROLOL TARTRATE 5 MG/5ML IV SOLN
5.0000 mg | Freq: Once | INTRAVENOUS | Status: AC
  Administered 2024-04-22: 5 mg via INTRAVENOUS
  Filled 2024-04-22: qty 5

## 2024-04-22 MED ORDER — METOPROLOL TARTRATE 25 MG PO TABS
25.0000 mg | ORAL_TABLET | ORAL | Status: AC
  Administered 2024-04-22: 25 mg via ORAL
  Filled 2024-04-22: qty 1

## 2024-04-22 MED ORDER — METOPROLOL SUCCINATE ER 200 MG PO TB24: 200.0000 mg | ORAL_TABLET | Freq: Every day | ORAL | 1 refills | Status: DC

## 2024-04-22 MED ORDER — FENTANYL CITRATE PF 50 MCG/ML IJ SOSY
PREFILLED_SYRINGE | INTRAMUSCULAR | Status: AC
  Administered 2024-04-22: 6.5 ug via INTRAVENOUS
  Filled 2024-04-22: qty 1

## 2024-04-22 MED ORDER — METOPROLOL SUCCINATE ER 100 MG PO TB24
200.0000 mg | ORAL_TABLET | Freq: Every day | ORAL | Status: DC
  Administered 2024-04-22: 200 mg via ORAL
  Filled 2024-04-22: qty 2

## 2024-04-22 NOTE — Progress Notes (Signed)
 Daily Progress Note  DOA: 04/19/2024 Hospital Day: 4  Cc: Iron deficiency anemia and FOBT +  ASSESSMENT    Recurrent GI bleed / anemia / GAVE.  83 yo female with a history of IDA and GAVE admitted with recurrent , severe anemia ( hgb 4.6) and FOBT+ on Eliquis . EGD this admission with bleeding GAVE s/p APC.  TODAY: Hgb improved to 8 post 2u RBCs and IV iron  AFIB s/p Watchman device Recent PE on Eliquis   CKD  CHF  HTN  Diabetes  Principal Problem:   Symptomatic anemia Active Problems:   Essential hypertension   Hypothyroidism   Occult GI bleeding   Iron deficiency anemia   HFrEF (heart failure with reduced ejection fraction) (HCC)   History of GAVE (gastric antral vascular ectasia)   Persistent atrial fibrillation (HCC)   History of pulmonary embolism   GI bleeding    PLAN   --Primary GI - Dr. Shaaron can monitor H/H and iron studies. If has refractory anemia or IDA then consider referral to Dr. Wilhelmenia for possible band ligation of GAVE  --Received dose of IV FE x 1 and arranging for additional IV iron outpatient.  --Continue BID PPI --Eliquis  on hold for another 24 hours   Subjective   No physical complaints   Objective   GI Studies:   04/21/24 EGD --Normal esophagus --Normal gastric antral vascular ectasia with bleeding treated with APC --Normal duodenum  Recent Labs    04/21/24 0654 04/21/24 2227 04/22/24 0259  WBC 5.7 6.3 7.5  HGB 7.9* 7.9* 8.0*  HCT 26.9* 27.4* 27.8*  MCV 88.2 88.7 90.3  PLT 255 252 266   Recent Labs    04/20/24 1504  FERRITIN 14  TIBC 412  IRONPCTSAT NOT CALCULATED   Recent Labs    04/19/24 2149 04/19/24 2151 04/20/24 0722 04/21/24 0654  NA 138 140 140 141  K 4.0 4.0 3.5 3.9  CL 110 110 109 113*  CO2 18*  --  20* 18*  GLUCOSE 172* 169* 105* 102*  BUN 43* 39* 36* 34*  CREATININE 1.48* 1.60* 1.40* 1.46*  CALCIUM  10.8*  --  10.7* 10.5*   Recent Labs    04/19/24 2149 04/20/24 0722  PROT 5.3*  5.2*  ALBUMIN  3.3* 3.2*  AST 12* 14*  ALT 9 8  ALKPHOS 72 67  BILITOT 0.5 1.4*      Imaging:  CT RENAL STONE STUDY CLINICAL DATA:  Abdominal pain, flank pain  EXAM: CT ABDOMEN AND PELVIS WITHOUT CONTRAST  TECHNIQUE: Multidetector CT imaging of the abdomen and pelvis was performed following the standard protocol without IV contrast.  RADIATION DOSE REDUCTION: This exam was performed according to the departmental dose-optimization program which includes automated exposure control, adjustment of the mA and/or kV according to patient size and/or use of iterative reconstruction technique.  COMPARISON:  12/24/2020  FINDINGS: Lower chest: Cardiomegaly.  No acute findings.  Hepatobiliary: No focal hepatic abnormality. Gallbladder unremarkable.  Pancreas: No focal abnormality or ductal dilatation.  Spleen: No focal abnormality.  Normal size.  Adrenals/Urinary Tract: Adrenal glands normal. Numerous bilateral renal cysts. No follow-up imaging recommended. Bilateral nephrolithiasis. No ureteral stones or hydronephrosis. Urinary bladder unremarkable.  Stomach/Bowel: Sigmoid diverticulosis. No active diverticulitis. Stomach and small bowel decompressed, unremarkable.  Vascular/Lymphatic: Aortic atherosclerosis. No evidence of aneurysm or adenopathy.  Reproductive: Prior hysterectomy.  No adnexal masses.  Other: No free fluid or free air.  Musculoskeletal: Bilateral hip replacements. No acute bony abnormality.  IMPRESSION: Bilateral nephrolithiasis.  No hydronephrosis.  Aortic atherosclerosis.  Cardiomegaly.  Sigmoid diverticulosis.  No acute findings.  Electronically Signed   By: Franky Crease M.D.   On: 04/22/2024 00:30     Scheduled inpatient medications:   levothyroxine   50 mcg Oral QAC breakfast   metoprolol  succinate  200 mg Oral Daily   oxybutynin   10 mg Oral QHS   pantoprazole  (PROTONIX ) IV  40 mg Intravenous Q12H   sodium chloride  flush  3 mL  Intravenous Q12H   Continuous inpatient infusions:  PRN inpatient medications: acetaminophen  **OR** acetaminophen , ondansetron  **OR** ondansetron  (ZOFRAN ) IV, sodium chloride  flush  Vital signs in last 24 hours: Temp:  [97.2 F (36.2 C)-98.7 F (37.1 C)] 98.6 F (37 C) (09/28 2342) Pulse Rate:  [91-144] 108 (09/29 0121) Resp:  [16-29] 17 (09/29 0121) BP: (108-151)/(50-101) 132/71 (09/29 0121) SpO2:  [92 %-97 %] 97 % (09/29 0121) Last BM Date : 04/19/24  Intake/Output Summary (Last 24 hours) at 04/22/2024 1033 Last data filed at 04/22/2024 0033 Gross per 24 hour  Intake 110 ml  Output --  Net 110 ml    Intake/Output from previous day: 09/28 0701 - 09/29 0700 In: 310 [I.V.:200; IV Piggyback:110] Out: -  Intake/Output this shift: No intake/output data recorded.   Physical Exam:  General: Alert female in NAD Heart:  Regular rate and rhythm.  Pulmonary: Normal respiratory effort Abdomen: Soft, nondistended, nontender. Normal bowel sounds. Extremities: No lower extremity edema  Neurologic: Alert and oriented Psych: Pleasant. Cooperative     LOS: 2 days   Vina Dasen ,NP 04/22/2024, 10:33 AM

## 2024-04-22 NOTE — TOC Transition Note (Signed)
 Transition of Care [facility]) - Discharge Note   Patient Details  Name: [name] [name] MRN: [id number] Date of Birth: 1941/07/15  [facility]) CM/SW Contact:  Tom-Johnson, Harvest Muskrat, RN Phone Number: 04/22/2024, 9:40 AM   Clinical Narrative:     Patient is scheduled for discharge today.  Readmission Risk Assessment done. Outpatient f/u, hospital f/u and discharge instructions on AVS. No ICM needs or recommendations noted. CM spoke with daughter, Ginnie [phone]) about discharge transportation. Ginnie states herself or one of her brothers will transport at discharge.  No further ICM needs noted.       Final next level of care: Home/Self Care Barriers to Discharge: Barriers Resolved   Patient Goals and CMS Choice Patient states their goals for this hospitalization and ongoing recovery are:: To return home CMS Medicare.gov Compare Post Acute Care list provided to:: Patient Choice offered to / list presented to : NA      Discharge Placement                Patient to be transferred to facility by: Family      Discharge Plan and Services Additional resources added to the After Visit Summary for                  DME Arranged: N/A DME Agency: NA       HH Arranged: NA HH Agency: NA        Social Drivers of Health (SDOH) Interventions SDOH Screenings   Food Insecurity: No Food Insecurity (04/20/2024)  Housing: Low Risk  (04/20/2024)  Transportation Needs: No Transportation Needs (04/20/2024)  Utilities: Not At Risk (04/20/2024)  Alcohol  Screen: Low Risk  (12/27/2022)  Depression (PHQ2-9): Low Risk  (02/23/2024)  Financial Resource Strain: Low Risk  (01/23/2024)  Physical Activity: Insufficiently Active (01/23/2024)  Social Connections: Moderately Isolated (04/20/2024)  Stress: No Stress Concern Present (11/23/2023)  Tobacco Use: Low Risk  (04/21/2024)  Health Literacy: Adequate Health Literacy (12/28/2023)     Readmission Risk Interventions     01/16/2024    4:05 PM  Readmission Risk Prevention Plan  Transportation Screening Complete  PCP or Specialist Appt within 5-7 Days Complete  Home Care Screening Complete  Medication Review (RN CM) Complete

## 2024-04-22 NOTE — Plan of Care (Signed)

## 2024-04-22 NOTE — Discharge Summary (Signed)
 PATIENT DETAILS Name: [name] [name] Age: 83 y.o. Sex: female Date of Birth: November 12, 1940 MRN: [id number]. Admitting Physician: Subrina Sundil, MD ERE:[id number], Annabella HERO, FNP  Admit Date: 04/19/2024 Discharge date: 04/22/2024  Recommendations for Outpatient Follow-up:  Follow up with PCP in 1-2 weeks Please obtain CMP/CBC in one week PCP to assess if long-term anticoagulation is warranted-history of recurrent GI bleeding due to GAVE.   Admitted From:  Home  Disposition: Home   Discharge Condition: good  CODE STATUS:   Code Status: Full Code   Diet recommendation:  Diet Order             Diet - low sodium heart healthy           DIET SOFT Fluid consistency: Thin  Diet effective now                    Brief Summary: Patient is a 83 y.o.  female with history of A-fib-s/p watchman's device November 2024, second episode of VTE (PE/DVT) in June 2025-on Eliquis , prior history of GI bleed secondary to GAVE-presented with fatigue/exertional dyspnea-found to have profound anemia.   Significant events: 9/27>> admit to [facility]   Significant studies: 9/26>> CXR: No PNA.   Significant microbiology data: None   Procedures: None   Consults: GI  Brief Hospital Course: Severe symptomatic anemia-likely due to chronic blood loss-probably slow upper GI bleed in the setting of known GAVE-and being on Eliquis . Hb stable after 2 units of PRBC-received IV iron yesterday as well S/p EGD on 9/28--GAVE-treated with APC. Continue PPI on discharge. PCP to follow CBC in 1 week to ensure stability Follow-up with Dr. Shaaron at [facility].   History of persistent atrial fibrillation-s/p watchman's device November 2024 Slight RVR overnight-given IV Lopressor -heart rate reasonable this morning-increased oral Lopressor  from 150 mg to 200 mg on discharge.   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   Chronic HFmrEF Euvolemic on exam   HTN BP stable Toprol    CKD stage IIIa Close to baseline   Hypothyroidism Synthroid .   Class 1 Obesity: Estimated body mass index is 34.17 kg/m as calculated from the following:   Height as of this encounter: 5' 3 (1.6 m).   Weight as of this encounter: 87.5 kg.   Discharge Diagnoses:  Principal Problem:   Symptomatic anemia Active Problems:   Occult GI bleeding   Essential hypertension   Hypothyroidism   Iron deficiency anemia   HFrEF (heart failure with reduced ejection fraction) (HCC)   History of GAVE (gastric antral vascular ectasia)   Persistent atrial fibrillation (HCC)   History of pulmonary embolism   GI bleeding   Discharge Instructions:  Activity:  As tolerated with Full fall precautions use walker/cane & assistance as needed  Discharge Instructions     Call MD for:   Complete by: As directed    Black stools/bloody stools/coffee-ground vomitus   Diet - low sodium heart healthy   Complete by: As directed    Discharge instructions   Complete by: As directed    Follow with Primary MD  Joesph Annabella HERO, FNP in 1-2 weeks  Please get a complete blood count and chemistry panel checked  by your Primary MD at your next visit, and again as instructed by your Primary MD.  Get Medicines reviewed and adjusted: Please take all your medications with you for your next visit with your Primary MD  Laboratory/radiological data: Please request your Primary MD to go over all hospital tests and procedure/radiological results at the follow up, please ask your Primary MD to get all Hospital records sent to his/her office.  In some cases, they will be blood  work, cultures and biopsy results pending at the time of your discharge. Please request that your primary care M.D. follows up on these results.  Also Note the following: If you experience worsening of your admission symptoms, develop shortness of breath, life threatening emergency, suicidal or homicidal thoughts you must seek medical attention immediately by calling 911 or calling your MD immediately  if symptoms less severe.  You must read complete instructions/literature along with all the possible adverse reactions/side effects for all the Medicines you take and that have been prescribed to you. Take any new Medicines after you have completely understood and accpet all the possible adverse reactions/side effects.   Do not drive when taking Pain medications or sleeping medications (Benzodaizepines)  Do not take more than prescribed Pain, Sleep and Anxiety Medications. It is not advisable to combine anxiety,sleep and pain medications without talking with your primary care practitioner  Special Instructions: If you have smoked or chewed Tobacco  in the last 2 yrs please stop smoking, stop any regular Alcohol   and or any Recreational drug use.  Wear Seat belts while driving.  Please note: You were cared for by a hospitalist during your hospital stay. Once you are discharged, your primary care physician will handle any further medical issues. Please note that NO REFILLS for any discharge medications will be authorized once you are discharged, as it is imperative that you return to your primary care physician (or establish a relationship with a primary care physician if you do not have one) for your post hospital discharge needs so that they can reassess your need for medications and monitor your lab values.   Increase activity slowly   Complete by: As directed       Allergies as of 04/22/2024   No Known Allergies      Medication List     TAKE these medications    acetaminophen  325 MG  tablet Commonly known as: TYLENOL  Take 2 tablets (650 mg total) by mouth every 6 (six) hours as needed for mild pain or headache (or Fever >/= 101).   allopurinol  100 MG tablet Commonly known as: ZYLOPRIM  Take 1 tablet by mouth once daily   apixaban  5 MG Tabs tablet Commonly known as: ELIQUIS  Take 1 tablet (5 mg total) by mouth 2 (two) times daily. Start taking on: April 24, 2024 What changed: These instructions start on April 24, 2024. If you are unsure what to do until then, ask your doctor or other care provider.   atorvastatin  20 MG tablet Commonly known as: LIPITOR Take 1 tablet (20 mg total) by mouth daily.   cetirizine  10 MG tablet Commonly known as: ZYRTEC  Take 10 mg by mouth daily.   dapagliflozin  propanediol 10 MG Tabs tablet Commonly known as: Farxiga  Take 1 tablet (10 mg total) by mouth daily.   ferrous sulfate  325 (65 FE) MG EC tablet Take 325 mg by mouth daily.   furosemide  20 MG tablet Commonly known as: LASIX  Take 1 tablet (20 mg total) by mouth daily as needed  for fluid or edema.   GNP CRANBERRY EXTRACT PO Take 1 tablet by mouth in the morning.   levothyroxine  50 MCG tablet Commonly known as: SYNTHROID  Take 1 tablet (50 mcg total) by mouth daily before breakfast.   metoprolol  200 MG 24 hr tablet Commonly known as: TOPROL -XL Take 1 tablet (200 mg total) by mouth daily. What changed:  medication strength how much to take   oxybutynin  10 MG 24 hr tablet Commonly known as: DITROPAN -XL Take 1 tablet (10 mg total) by mouth at bedtime.   pantoprazole  40 MG tablet Commonly known as: PROTONIX  Take 1 tablet (40 mg total) by mouth 2 (two) times daily before a meal.   polyethylene glycol powder 17 GM/SCOOP powder Commonly known as: GLYCOLAX /MIRALAX  Take 1 capful (17 g) by mouth daily as needed for moderate constipation.        Follow-up Information     Joesph Annabella HERO, FNP. Schedule an appointment as soon as possible for a visit in 1 week(s).    Specialty: Family Medicine Contact information: [address] [place] [address] [phone]                No Known Allergies   Other Procedures/Studies: CT RENAL STONE STUDY Result Date: 04/22/2024 CLINICAL DATA:  Abdominal pain, flank pain EXAM: CT ABDOMEN AND PELVIS WITHOUT CONTRAST TECHNIQUE: Multidetector CT imaging of the abdomen and pelvis was performed following the standard protocol without IV contrast. RADIATION DOSE REDUCTION: This exam was performed according to the departmental dose-optimization program which includes automated exposure control, adjustment of the mA and/or kV according to patient size and/or use of iterative reconstruction technique. COMPARISON:  12/24/2020 FINDINGS: Lower chest: Cardiomegaly.  No acute findings. Hepatobiliary: No focal hepatic abnormality. Gallbladder unremarkable. Pancreas: No focal abnormality or ductal dilatation. Spleen: No focal abnormality.  Normal size. Adrenals/Urinary Tract: Adrenal glands normal. Numerous bilateral renal cysts. No follow-up imaging recommended. Bilateral nephrolithiasis. No ureteral stones or hydronephrosis. Urinary bladder unremarkable. Stomach/Bowel: Sigmoid diverticulosis. No active diverticulitis. Stomach and small bowel decompressed, unremarkable. Vascular/Lymphatic: Aortic atherosclerosis. No evidence of aneurysm or adenopathy. Reproductive: Prior hysterectomy.  No adnexal masses. Other: No free fluid or free air. Musculoskeletal: Bilateral hip replacements. No acute bony abnormality. IMPRESSION: Bilateral nephrolithiasis.  No hydronephrosis. Aortic atherosclerosis.  Cardiomegaly. Sigmoid diverticulosis. No acute findings. Electronically Signed   By: Franky Crease M.D.   On: 04/22/2024 00:30   DG Chest 2 View Result Date: 04/19/2024 EXAM: 2 VIEW(S) XRAY OF THE CHEST 04/17/2024 11:44:35 AM COMPARISON: 01/14/2024 CLINICAL HISTORY: shortness of breath. FINDINGS: LUNGS AND PLEURA: No focal pulmonary opacity. No  pulmonary edema. No pleural effusion. No pneumothorax. HEART AND MEDIASTINUM: Stable cardiomegaly. Left atrial appendage occlusion device noted. Aortic atherosclerosis. BONES AND SOFT TISSUES: Multilevel degenerative disc disease of the spine. No acute osseous abnormality. IMPRESSION: 1. No acute findings. 2. Stable cardiomegaly. Electronically signed by: Norman Gatlin MD 04/19/2024 10:02 PM EDT RP Workstation: [id number]   DG Chest Portable 1 View Result Date: 04/19/2024 EXAM: 1 VIEW(S) XRAY OF THE CHEST 04/19/2024 09:53:51 PM COMPARISON: Comparison with radiograph of 04/17/2024. CLINICAL HISTORY: SHOB. FINDINGS: LUNGS AND PLEURA: No focal pulmonary opacity. No pulmonary edema. No pleural effusion. No pneumothorax. HEART AND MEDIASTINUM: Left atrial appendage closure device. Stable enlargement of the cardiomediastinal silhouette. Aortic atherosclerotic calcification. BONES AND SOFT TISSUES: No acute osseous abnormality. IMPRESSION: 1. No acute cardiopulmonary abnormality. Electronically signed by: Norman Gatlin MD 04/19/2024 10:01 PM EDT RP Workstation: [id number]     TODAY-DAY OF DISCHARGE:  Subjective:  [name] [name] today has no headache,no chest abdominal pain,no new weakness tingling or numbness, feels much better wants to go home today.   Objective:   Blood pressure 132/71, pulse (!) 108, temperature 98.6 F (37 C), temperature source Oral, resp. rate 17, height 5' 3 (1.6 m), weight 87.5 kg, SpO2 97%.  Intake/Output Summary (Last 24 hours) at 04/22/2024 0931 Last data filed at 04/22/2024 0033 Gross per 24 hour  Intake 110 ml  Output --  Net 110 ml   Filed Weights   04/19/24 2138  Weight: 87.5 kg    Exam: Awake Alert, Oriented *3, No new F.N deficits, Normal affect Rogers.AT,PERRAL Supple Neck,No JVD, No cervical lymphadenopathy appriciated.  Symmetrical Chest wall movement, Good air movement bilaterally, CTAB RRR,No Gallops,Rubs or new Murmurs, No Parasternal Heave +ve  B.Sounds, Abd Soft, Non tender, No organomegaly appriciated, No rebound -guarding or rigidity. No Cyanosis, Clubbing or edema, No new Rash or bruise   PERTINENT RADIOLOGIC STUDIES: CT RENAL STONE STUDY Result Date: 04/22/2024 CLINICAL DATA:  Abdominal pain, flank pain EXAM: CT ABDOMEN AND PELVIS WITHOUT CONTRAST TECHNIQUE: Multidetector CT imaging of the abdomen and pelvis was performed following the standard protocol without IV contrast. RADIATION DOSE REDUCTION: This exam was performed according to the departmental dose-optimization program which includes automated exposure control, adjustment of the mA and/or kV according to patient size and/or use of iterative reconstruction technique. COMPARISON:  12/24/2020 FINDINGS: Lower chest: Cardiomegaly.  No acute findings. Hepatobiliary: No focal hepatic abnormality. Gallbladder unremarkable. Pancreas: No focal abnormality or ductal dilatation. Spleen: No focal abnormality.  Normal size. Adrenals/Urinary Tract: Adrenal glands normal. Numerous bilateral renal cysts. No follow-up imaging recommended. Bilateral nephrolithiasis. No ureteral stones or hydronephrosis. Urinary bladder unremarkable. Stomach/Bowel: Sigmoid diverticulosis. No active diverticulitis. Stomach and small bowel decompressed, unremarkable. Vascular/Lymphatic: Aortic atherosclerosis. No evidence of aneurysm or adenopathy. Reproductive: Prior hysterectomy.  No adnexal masses. Other: No free fluid or free air. Musculoskeletal: Bilateral hip replacements. No acute bony abnormality. IMPRESSION: Bilateral nephrolithiasis.  No hydronephrosis. Aortic atherosclerosis.  Cardiomegaly. Sigmoid diverticulosis. No acute findings. Electronically Signed   By: Franky Crease M.D.   On: 04/22/2024 00:30     PERTINENT LAB RESULTS: CBC: Recent Labs    04/21/24 2227 04/22/24 0259  WBC 6.3 7.5  HGB 7.9* 8.0*  HCT 27.4* 27.8*  PLT 252 266   CMET CMP     Component Value Date/Time   NA 141 04/21/2024 0654    NA 142 04/17/2024 1205   K 3.9 04/21/2024 0654   CL 113 (H) 04/21/2024 0654   CO2 18 (L) 04/21/2024 0654   GLUCOSE 102 (H) 04/21/2024 0654   BUN 34 (H) 04/21/2024 0654   BUN 30 (H) 04/17/2024 1205   CREATININE 1.46 (H) 04/21/2024 0654   CALCIUM  10.5 (H) 04/21/2024 0654   PROT 5.2 (L) 04/20/2024 0722   PROT 6.1 04/20/2023 1028   ALBUMIN  3.2 (L) 04/20/2024 0722   ALBUMIN  4.2 04/20/2023 1028   AST 14 (L) 04/20/2024 0722   ALT 8 04/20/2024 0722   ALKPHOS 67 04/20/2024 0722   BILITOT 1.4 (H) 04/20/2024 0722   BILITOT 0.2 04/20/2023 1028   GFR 43.51 (L) 07/08/2022 1422   EGFR 41 (L) 04/17/2024 1205   GFRNONAA 36 (L) 04/21/2024 0654    GFR Estimated Creatinine Clearance: 31.1 mL/min (A) (by C-G formula based on SCr of 1.46 mg/dL (H)). No results for input(s): LIPASE, AMYLASE in the last 72 hours. No results for input(s): CKTOTAL, CKMB, CKMBINDEX, TROPONINI in the last 72  hours. Invalid input(s): POCBNP No results for input(s): DDIMER in the last 72 hours. No results for input(s): HGBA1C in the last 72 hours. No results for input(s): CHOL, HDL, LDLCALC, TRIG, CHOLHDL, LDLDIRECT in the last 72 hours. No results for input(s): TSH, T4TOTAL, T3FREE, THYROIDAB in the last 72 hours.  Invalid input(s): FREET3 Recent Labs    04/20/24 1504  FERRITIN 14  TIBC 412  IRON <10*  RETICCTPCT 2.1   Coags: No results for input(s): INR in the last 72 hours.  Invalid input(s): PT Microbiology: No results found for this or any previous visit (from the past 240 hours).  FURTHER DISCHARGE INSTRUCTIONS:  Get Medicines reviewed and adjusted: Please take all your medications with you for your next visit with your Primary MD  Laboratory/radiological data: Please request your Primary MD to go over all hospital tests and procedure/radiological results at the follow up, please ask your Primary MD to get all Hospital records sent to his/her office.  In  some cases, they will be blood work, cultures and biopsy results pending at the time of your discharge. Please request that your primary care M.D. goes through all the records of your hospital data and follows up on these results.  Also Note the following: If you experience worsening of your admission symptoms, develop shortness of breath, life threatening emergency, suicidal or homicidal thoughts you must seek medical attention immediately by calling 911 or calling your MD immediately  if symptoms less severe.  You must read complete instructions/literature along with all the possible adverse reactions/side effects for all the Medicines you take and that have been prescribed to you. Take any new Medicines after you have completely understood and accpet all the possible adverse reactions/side effects.   Do not drive when taking Pain medications or sleeping medications (Benzodaizepines)  Do not take more than prescribed Pain, Sleep and Anxiety Medications. It is not advisable to combine anxiety,sleep and pain medications without talking with your primary care practitioner  Special Instructions: If you have smoked or chewed Tobacco  in the last 2 yrs please stop smoking, stop any regular Alcohol   and or any Recreational drug use.  Wear Seat belts while driving.  Please note: You were cared for by a hospitalist during your hospital stay. Once you are discharged, your primary care physician will handle any further medical issues. Please note that NO REFILLS for any discharge medications will be authorized once you are discharged, as it is imperative that you return to your primary care physician (or establish a relationship with a primary care physician if you do not have one) for your post hospital discharge needs so that they can reassess your need for medications and monitor your lab values.  Total Time spent coordinating discharge including counseling, education and face to face time equals greater  than 30 minutes.  SignedBETHA Donalda Applebaum 04/22/2024 9:31 AM

## 2024-04-22 NOTE — Progress Notes (Signed)
Explained discharge instructions to patient. Reviewed follow up appointment and next medication administration times. Also reviewed education. Patient verbalized having an understanding for instructions given. All belongings are in the patient's possession. IV and telemetry were removed. CCMD was notified. No other needs verbalized. Transported downstairs for discharge. 

## 2024-04-23 ENCOUNTER — Telehealth: Payer: Self-pay | Admitting: *Deleted

## 2024-04-23 LAB — BPAM RBC
Blood Product Expiration Date: 202511032359
Blood Product Expiration Date: 202511032359
Blood Product Expiration Date: 202511032359
Blood Product Expiration Date: 202511032359
ISSUE DATE / TIME: 202509262351
ISSUE DATE / TIME: 202509270223
ISSUE DATE / TIME: 202509270421
Unit Type and Rh: 5100
Unit Type and Rh: 5100
Unit Type and Rh: 5100
Unit Type and Rh: 5100

## 2024-04-23 LAB — TYPE AND SCREEN
ABO/RH(D): O POS
Antibody Screen: NEGATIVE
Donor AG Type: NEGATIVE
Donor AG Type: NEGATIVE
Donor AG Type: NEGATIVE
Unit division: 0
Unit division: 0
Unit division: 0
Unit division: 0

## 2024-04-23 NOTE — Transitions of Care (Post Inpatient/ED Visit) (Signed)
 04/23/2024  Name: [name] [name] MRN: [id number] DOB: September 17, 1940  Today's TOC FU Call Status: Today's TOC FU Call Status:: Successful TOC FU Call Completed TOC FU Call Complete Date: 04/23/24 Patient's Name and Date of Birth confirmed.  Transition Care Management Follow-up Telephone Call Date of Discharge: 04/22/24 Discharge Facility: [facility]) Type of Discharge: Inpatient Admission Primary Inpatient Discharge Diagnosis:: symptomatic anemia How have you been since you were released from the hospital?:  (eating, drinking well,  states  feel so much better, denies any GI bleeding, using walker as needed) Any questions or concerns?: No  Items Reviewed: Did you receive and understand the discharge instructions provided?: Yes Medications obtained,verified, and reconciled?: Yes (Medications Reviewed) Any new allergies since your discharge?: No Dietary orders reviewed?: Yes Type of Diet Ordered:: low sodium, heart healthy Do you have support at home?: Yes People in Home [RPT]: child(ren), adult Name of Support/Comfort Primary Source: adult son lives wtih pt, has other adult children that assist as needed  Medications Reviewed Today: Medications Reviewed Today     Reviewed by Aura Mliss LABOR, RN (Registered Nurse) on 04/23/24 at 1210  Med List Status: <None>   Medication Order Taking? Sig Documenting Provider Last Dose Status Informant  acetaminophen  (TYLENOL ) 325 MG tablet [id number] Yes Take 2 tablets (650 mg total) by mouth every 6 (six) hours as needed for mild pain or headache (or Fever >/= 101). Pearlean Manus, MD  Active Self, Pharmacy Records  allopurinol  (ZYLOPRIM ) 100 MG tablet [id number] Yes Take 1 tablet by mouth once daily Joesph Annabella HERO, FNP  Active Self, Pharmacy Records  apixaban  (ELIQUIS ) 5 MG TABS tablet [id number]  Take 1 tablet (5 mg total) by mouth 2 (two) times daily.  Patient not taking: Reported on 04/23/2024   Raenelle Donalda HERO, MD  Active    atorvastatin  (LIPITOR) 20 MG tablet [id number] Yes Take 1 tablet (20 mg total) by mouth daily. Joesph Annabella HERO, FNP  Active Self, Pharmacy Records  cetirizine  (ZYRTEC ) 10 MG tablet [id number] Yes Take 10 mg by mouth daily. [provider]  Active Self, Pharmacy Records  dapagliflozin  propanediol (FARXIGA ) 10 MG TABS tablet [id number] Yes Take 1 tablet (10 mg total) by mouth daily. Joesph Annabella HERO, FNP  Active Self, Pharmacy Records  ferrous sulfate  325 [phone]) MG EC tablet [id number] Yes Take 325 mg by mouth daily. [provider]  Active Self, Pharmacy Records  furosemide  (LASIX ) 20 MG tablet [id number] Yes Take 1 tablet (20 mg total) by mouth daily as needed for fluid or edema. Cheryle Page, MD  Active Self, Pharmacy Records  [facility] EXTRACT PO [id number] Yes Take 1 tablet by mouth in the morning. [provider]  Active Self, Pharmacy Records  levothyroxine  (SYNTHROID ) 50 MCG tablet [id number] Yes Take 1 tablet (50 mcg total) by mouth daily before breakfast. Joesph Annabella HERO, FNP  Active Self, Pharmacy Records  metoprolol  succinate (TOPROL -XL) 200 MG 24 hr tablet [id number] Yes Take 1 tablet (200 mg total) by mouth daily. Ghimire, Donalda HERO, MD  Active   oxybutynin  (DITROPAN -XL) 10 MG 24 hr tablet [id number] Yes Take 1 tablet (10 mg total) by mouth at bedtime. Joesph Annabella HERO, FNP  Active Self, Pharmacy Records  pantoprazole  (PROTONIX ) 40 MG tablet [id number] Yes Take 1 tablet (40 mg total) by mouth 2 (two) times daily before a meal. Joesph Annabella HERO, FNP  Active Self, Pharmacy Records  polyethylene glycol powder (GLYCOLAX /MIRALAX ) 17 GM/SCOOP powder [id number] Yes Take 1 capful (17 g) by  mouth daily as needed for moderate constipation. Cheryle Page, MD  Active Self, Pharmacy Records            Home Care and Equipment/Supplies: Were Home Health Services Ordered?: No Any new equipment or medical supplies ordered?: No  Functional Questionnaire: Do you need  assistance with bathing/showering or dressing?: No Do you need assistance with meal preparation?: No Do you need assistance with eating?: No Do you have difficulty maintaining continence: No Do you need assistance with getting out of bed/getting out of a chair/moving?: Yes (walker) Do you have difficulty managing or taking your medications?: No  Follow up appointments reviewed: PCP Follow-up appointment confirmed?: No (pt declines for RN CM to schedule post hospital follow up appointment, pt states she will follow up with specialists and make her own appointment) MD Provider Line Number:[phone] Given: No (pt has lab appointment on 05/06/24) Specialist Hospital Follow-up appointment confirmed?: Yes Date of Specialist follow-up appointment?: 04/13/24 Follow-Up Specialty Provider:: hematology  [facility]        10/31  GI Dr. Tillman Do you need transportation to your follow-up appointment?: No Do you understand care options if your condition(s) worsen?: Yes-patient verbalized understanding   SDOH Interventions Today    Flowsheet Row Most Recent Value  SDOH Interventions   Food Insecurity Interventions Intervention Not Indicated  Housing Interventions Intervention Not Indicated  Transportation Interventions Intervention Not Indicated  Utilities Interventions Intervention Not Indicated    Goals Addressed             This Visit's Progress    VBCI Transitions of Care (TOC) Care Plan       Problems:  Recent Hospitalization for treatment of symptomatic anemia No Hospital Follow Up Provider appointment pt declines for RN CM to schedule post hospital follow up, pt has scheduled specialist appointments  Pt weighs daily 194.6 pounds today Adult son lives with pt  Goal:  Over the next 30 days, the patient will not experience hospital readmission  Interventions:  Evaluation of current treatment plan related to symptomatic anemia,  self-management and patient's adherence to  plan as established by provider. Discussed plans with patient for ongoing care management follow up and provided patient with direct contact information for care management team Evaluation of current treatment plan related to symptomatic anemia and patient's adherence to plan as established by provider Reviewed medications with patient and discussed importance of taking as prescribed Reviewed scheduled/upcoming provider appointments including hematologist 05/13/24, GI 05/24/24,  labwork 05/06/24 Screening for signs and symptoms of depression related to chronic disease state  Assessed social determinant of health barriers Reviewed signs/ symptoms GI bleeding, action plan, importance of reporting immediately  Patient Self Care Activities:  Attend all scheduled provider appointments Attend church or other social activities Call pharmacy for medication refills 3-7 days in advance of running out of medications Notify RN Care Manager of TOC call rescheduling needs Participate in Transition of Care Program/Attend TOC scheduled calls Perform all self care activities independently  Take medications as prescribed    Plan:  Telephone follow up appointment with care management team member scheduled for:  04/30/24 @ 115 pm The patient has been provided with contact information for the care management team and has been advised to call with any health related questions or concerns.         Mliss Creed [facility], BSN RN Care Manager/ Transition of Care [place]/ [facility] [phone]

## 2024-04-30 ENCOUNTER — Other Ambulatory Visit: Payer: Self-pay | Admitting: *Deleted

## 2024-04-30 NOTE — Patient Instructions (Signed)
 Visit Information  Thank you for taking time to visit with me today. Please don't hesitate to contact me if I can be of assistance to you before our next scheduled telephone appointment.  Our next appointment is by telephone on 05/07/24 @ 115 pm  Following is a copy of your care plan:   Goals Addressed             This Visit's Progress    VBCI Transitions of Care (TOC) Care Plan       Problems:  Recent Hospitalization for treatment of symptomatic anemia No Hospital Follow Up Provider appointment pt declines for RN CM to schedule post hospital follow up, pt has scheduled specialist appointments  Pt weighs daily 194 pounds today, no new concerns reported Adult son lives with pt  Goal:  Over the next 30 days, the patient will not experience hospital readmission  Interventions:  Evaluation of current treatment plan related to symptomatic anemia,  self-management and patient's adherence to plan as established by provider. Discussed plans with patient for ongoing care management follow up and provided patient with direct contact information for care management team Evaluation of current treatment plan related to symptomatic anemia and patient's adherence to plan as established by provider Reviewed medications with patient and discussed importance of taking as prescribed Reviewed scheduled/upcoming provider appointments including hematologist 05/13/24, GI 05/24/24,  labwork 05/06/24 Reinforced signs/ symptoms GI bleeding, action plan, importance of reporting immediately Reviewed HF action plan  Patient Self Care Activities:  Attend all scheduled provider appointments Attend church or other social activities Call pharmacy for medication refills 3-7 days in advance of running out of medications Notify RN Care Manager of TOC call rescheduling needs Participate in Transition of Care Program/Attend TOC scheduled calls Perform all self care activities independently  Take medications as  prescribed   Continue to weigh daily and keep a log  Plan:  Telephone follow up appointment with care management team member scheduled for:  05/07/24 @ 115 pm The patient has been provided with contact information for the care management team and has been advised to call with any health related questions or concerns.         Patient verbalizes understanding of instructions and care plan provided today and agrees to view in MyChart. Active MyChart status and patient understanding of how to access instructions and care plan via MyChart confirmed with patient.     Telephone follow up appointment with care management team member scheduled for: 05/07/24 @ 115 pm  Please call the care guide team at [phone] if you need to cancel or reschedule your appointment.   Please call the Suicide and Crisis Lifeline: 988 call the [place]  National Suicide Prevention Lifeline: 308-501-5282 or TTY: 337 872 4699 TTY 619 419 7678) to talk to a trained counselor call 1-800-273-TALK (toll free, 24 hour hotline) go to [facility] [address] [place] [phone]) call the [facility] Crisis Line: [phone] call 911 if you are experiencing a Mental Health or Behavioral Health Crisis or need someone to talk to.  Mliss Creed [facility], BSN RN Care Manager/ Transition of Care [place]/ [facility] [phone]

## 2024-04-30 NOTE — Patient Outreach (Signed)
 Transition of Care week 2  Visit Note  04/30/2024  Name: [name] [name] MRN: [id number]          DOB: [birth year]  Situation: Patient enrolled in [facility] 30-day program. Visit completed with patient by telephone.   Background:    Past Medical History:  Diagnosis Date   Anemia    Arthritis    Ostearthritis- hips, knees, fingers   Atrial fibrillation (HCC)    Basal cell carcinoma (BCC) of right hand 10/2021   Dx by [facility]   Blood transfusion without reported diagnosis 2021   Chronic kidney disease    DVT (deep venous thrombosis) (HCC)    Dyspnea    Dysrhythmia    GERD (gastroesophageal reflux disease)    Headache(784.0)    tx. Valproic  acid   History of diabetes mellitus    History of kidney stones    Hypertension    Hypothyroidism    Presence of Watchman left atrial appendage closure device 06/15/2023   24mm Watchman FLX Pro device placed by Dr. Cindie   Pulmonary embolism [facility])     Assessment: Patient Reported Symptoms: Cognitive Cognitive Status: No symptoms reported, Alert and oriented to person, place, and time, Normal speech and language skills, Able to follow simple commands      Neurological Neurological Review of Symptoms: No symptoms reported    HEENT HEENT Symptoms Reported: No symptoms reported      Cardiovascular Cardiovascular Symptoms Reported: No symptoms reported Weight: 194 lb (88 kg)  Respiratory Respiratory Symptoms Reported: No symptoms reported    Endocrine Endocrine Symptoms Reported: No symptoms reported Is patient diabetic?: No    Gastrointestinal Gastrointestinal Symptoms Reported: No symptoms reported Additional Gastrointestinal Details: pt denies any GI bleeding Gastrointestinal Management Strategies: Adequate rest Gastrointestinal Self-Management Outcome: 3 (uncertain) Gastrointestinal Comment: to follow up with Dr. Shaaron  GI on 10/31, reinforced signs/ symptoms GI bleeding    Genitourinary Genitourinary Symptoms Reported:  No symptoms reported    Integumentary Integumentary Symptoms Reported: No symptoms reported    Musculoskeletal Musculoskelatal Symptoms Reviewed: Limited mobility Additional Musculoskeletal Details: walker as needed Musculoskeletal Management Strategies: Medical device, Adequate rest Musculoskeletal Self-Management Outcome: 4 (good) Musculoskeletal Comment: reinforced safety precautions      Psychosocial Psychosocial Symptoms Reported: No symptoms reported         There were no vitals filed for this visit.  Medications Reviewed Today     Reviewed by Aura Mliss LABOR, RN (Registered Nurse) on 04/30/24 at 1343  Med List Status: <None>   Medication Order Taking? Sig Documenting Provider Last Dose Status Informant  acetaminophen  (TYLENOL ) 325 MG tablet [id number]  Take 2 tablets (650 mg total) by mouth every 6 (six) hours as needed for mild pain or headache (or Fever >/= 101). Pearlean Manus, MD  Active Self, Pharmacy Records  allopurinol  (ZYLOPRIM ) 100 MG tablet [id number]  Take 1 tablet by mouth once daily Joesph Annabella HERO, FNP  Active Self, Pharmacy Records  apixaban  (ELIQUIS ) 5 MG TABS tablet [id number]  Take 1 tablet (5 mg total) by mouth 2 (two) times daily.  Patient not taking: Reported on 04/23/2024   Raenelle Donalda HERO, MD  Active   atorvastatin  (LIPITOR) 20 MG tablet [id number]  Take 1 tablet (20 mg total) by mouth daily. Joesph Annabella HERO, FNP  Active Self, Pharmacy Records  cetirizine  (ZYRTEC ) 10 MG tablet [id number]  Take 10 mg by mouth daily. [provider]  Active Self, Pharmacy Records  dapagliflozin  propanediol (FARXIGA ) 10 MG TABS tablet [id number]  Take 1 tablet (10 mg total) by mouth daily. Joesph Annabella HERO, FNP  Active Self, Pharmacy Records  ferrous sulfate  325 (65 FE) MG EC tablet [id number]  Take 325 mg by mouth daily. [provider]  Active Self, Pharmacy Records  furosemide  (LASIX ) 20 MG tablet [id number]  Take 1 tablet (20 mg total) by mouth daily  as needed for fluid or edema. Cheryle Page, MD  Active Self, Pharmacy Records  [facility] EXTRACT PO [id number]  Take 1 tablet by mouth in the morning. [provider]  Active Self, Pharmacy Records  levothyroxine  (SYNTHROID ) 50 MCG tablet [id number]  Take 1 tablet (50 mcg total) by mouth daily before breakfast. Joesph Annabella HERO, FNP  Active Self, Pharmacy Records  metoprolol  succinate (TOPROL -XL) 200 MG 24 hr tablet [id number]  Take 1 tablet (200 mg total) by mouth daily. Ghimire, Donalda HERO, MD  Active   oxybutynin  (DITROPAN -XL) 10 MG 24 hr tablet [id number]  Take 1 tablet (10 mg total) by mouth at bedtime. Joesph Annabella HERO, FNP  Active Self, Pharmacy Records  pantoprazole  (PROTONIX ) 40 MG tablet [id number]  Take 1 tablet (40 mg total) by mouth 2 (two) times daily before a meal. Joesph Annabella HERO, FNP  Active Self, Pharmacy Records  polyethylene glycol powder (GLYCOLAX /MIRALAX ) 17 GM/SCOOP powder [id number]  Take 1 capful (17 g) by mouth daily as needed for moderate constipation. Cheryle Page, MD  Active Self, Pharmacy Records            Recommendation:   PCP Follow-up  Follow Up Plan:   Telephone follow-up 05/07/24 @ 115 pm  Mliss Creed [facility], BSN RN Care Manager/ Transition of Care [place]/ [facility] [phone]

## 2024-05-01 DIAGNOSIS — I4891 Unspecified atrial fibrillation: Secondary | ICD-10-CM | POA: Diagnosis not present

## 2024-05-01 DIAGNOSIS — R0602 Shortness of breath: Secondary | ICD-10-CM | POA: Diagnosis not present

## 2024-05-02 DIAGNOSIS — R0602 Shortness of breath: Secondary | ICD-10-CM

## 2024-05-02 DIAGNOSIS — I4891 Unspecified atrial fibrillation: Secondary | ICD-10-CM

## 2024-05-06 ENCOUNTER — Telehealth: Payer: Self-pay

## 2024-05-06 ENCOUNTER — Inpatient Hospital Stay: Attending: Physician Assistant

## 2024-05-06 DIAGNOSIS — Z8049 Family history of malignant neoplasm of other genital organs: Secondary | ICD-10-CM | POA: Diagnosis not present

## 2024-05-06 DIAGNOSIS — Z7901 Long term (current) use of anticoagulants: Secondary | ICD-10-CM | POA: Insufficient documentation

## 2024-05-06 DIAGNOSIS — D509 Iron deficiency anemia, unspecified: Secondary | ICD-10-CM | POA: Diagnosis not present

## 2024-05-06 DIAGNOSIS — N1832 Chronic kidney disease, stage 3b: Secondary | ICD-10-CM | POA: Diagnosis not present

## 2024-05-06 DIAGNOSIS — K5900 Constipation, unspecified: Secondary | ICD-10-CM | POA: Insufficient documentation

## 2024-05-06 DIAGNOSIS — Z86718 Personal history of other venous thrombosis and embolism: Secondary | ICD-10-CM | POA: Diagnosis not present

## 2024-05-06 DIAGNOSIS — Z85828 Personal history of other malignant neoplasm of skin: Secondary | ICD-10-CM | POA: Diagnosis not present

## 2024-05-06 DIAGNOSIS — Z86711 Personal history of pulmonary embolism: Secondary | ICD-10-CM | POA: Insufficient documentation

## 2024-05-06 DIAGNOSIS — Z7984 Long term (current) use of oral hypoglycemic drugs: Secondary | ICD-10-CM | POA: Diagnosis not present

## 2024-05-06 DIAGNOSIS — F5089 Other specified eating disorder: Secondary | ICD-10-CM | POA: Diagnosis not present

## 2024-05-06 DIAGNOSIS — D500 Iron deficiency anemia secondary to blood loss (chronic): Secondary | ICD-10-CM

## 2024-05-06 DIAGNOSIS — K31811 Angiodysplasia of stomach and duodenum with bleeding: Secondary | ICD-10-CM | POA: Insufficient documentation

## 2024-05-06 DIAGNOSIS — R5383 Other fatigue: Secondary | ICD-10-CM | POA: Insufficient documentation

## 2024-05-06 DIAGNOSIS — D649 Anemia, unspecified: Secondary | ICD-10-CM

## 2024-05-06 DIAGNOSIS — Z800 Family history of malignant neoplasm of digestive organs: Secondary | ICD-10-CM | POA: Diagnosis not present

## 2024-05-06 DIAGNOSIS — Z79899 Other long term (current) drug therapy: Secondary | ICD-10-CM | POA: Diagnosis not present

## 2024-05-06 DIAGNOSIS — I4891 Unspecified atrial fibrillation: Secondary | ICD-10-CM | POA: Insufficient documentation

## 2024-05-06 LAB — CBC WITH DIFFERENTIAL/PLATELET
Abs Immature Granulocytes: 0.01 K/uL (ref 0.00–0.07)
Basophils Absolute: 0.1 K/uL (ref 0.0–0.1)
Basophils Relative: 1 %
Eosinophils Absolute: 0.1 K/uL (ref 0.0–0.5)
Eosinophils Relative: 2 %
HCT: 28.9 % — ABNORMAL LOW (ref 36.0–46.0)
Hemoglobin: 8.2 g/dL — ABNORMAL LOW (ref 12.0–15.0)
Immature Granulocytes: 0 %
Lymphocytes Relative: 19 %
Lymphs Abs: 0.9 K/uL (ref 0.7–4.0)
MCH: 27 pg (ref 26.0–34.0)
MCHC: 28.4 g/dL — ABNORMAL LOW (ref 30.0–36.0)
MCV: 95.1 fL (ref 80.0–100.0)
Monocytes Absolute: 0.6 K/uL (ref 0.1–1.0)
Monocytes Relative: 13 %
Neutro Abs: 2.9 K/uL (ref 1.7–7.7)
Neutrophils Relative %: 65 %
Platelets: 314 K/uL (ref 150–400)
RBC: 3.04 MIL/uL — ABNORMAL LOW (ref 3.87–5.11)
RDW: 19.7 % — ABNORMAL HIGH (ref 11.5–15.5)
WBC: 4.5 K/uL (ref 4.0–10.5)
nRBC: 0 % (ref 0.0–0.2)

## 2024-05-06 LAB — COMPREHENSIVE METABOLIC PANEL WITH GFR
ALT: 9 U/L (ref 0–44)
AST: 12 U/L — ABNORMAL LOW (ref 15–41)
Albumin: 3.8 g/dL (ref 3.5–5.0)
Alkaline Phosphatase: 94 U/L (ref 38–126)
Anion gap: 8 (ref 5–15)
BUN: 30 mg/dL — ABNORMAL HIGH (ref 8–23)
CO2: 25 mmol/L (ref 22–32)
Calcium: 11.2 mg/dL — ABNORMAL HIGH (ref 8.9–10.3)
Chloride: 107 mmol/L (ref 98–111)
Creatinine, Ser: 1.23 mg/dL — ABNORMAL HIGH (ref 0.44–1.00)
GFR, Estimated: 44 mL/min — ABNORMAL LOW (ref >60)
Glucose, Bld: 101 mg/dL — ABNORMAL HIGH (ref 70–99)
Potassium: 4.7 mmol/L (ref 3.5–5.1)
Sodium: 140 mmol/L (ref 135–145)
Total Bilirubin: 0.3 mg/dL (ref 0.0–1.2)
Total Protein: 5.7 g/dL — ABNORMAL LOW (ref 6.5–8.1)

## 2024-05-06 LAB — IRON AND TIBC
Iron: 111 ug/dL (ref 28–170)
Saturation Ratios: 29 % (ref 10.4–31.8)
TIBC: 384 ug/dL (ref 250–450)
UIBC: 273 ug/dL

## 2024-05-06 LAB — VITAMIN B12: Vitamin B-12: 380 pg/mL (ref 180–914)

## 2024-05-06 LAB — FERRITIN: Ferritin: 55 ng/mL (ref 11–307)

## 2024-05-07 ENCOUNTER — Other Ambulatory Visit: Payer: Self-pay | Admitting: *Deleted

## 2024-05-07 NOTE — Patient Instructions (Signed)
 Visit Information  Thank you for taking time to visit with me today. Please don't hesitate to contact me if I can be of assistance to you before our next scheduled telephone appointment.  Our next appointment is by telephone on 05/15/24 @ 115 pm  Following is a copy of your care plan:   Goals Addressed             This Visit's Progress    VBCI Transitions of Care (TOC) Care Plan       Problems:  Recent Hospitalization for treatment of symptomatic anemia No Hospital Follow Up Provider appointment pt declines for RN CM to schedule post hospital follow up, pt has scheduled specialist appointments  Pt weighs daily 195 pounds today, no new concerns reported Adult son lives with pt  Goal:  Over the next 30 days, the patient will not experience hospital readmission  Interventions:  Evaluation of current treatment plan related to symptomatic anemia,  self-management and patient's adherence to plan as established by provider. Discussed plans with patient for ongoing care management follow up and provided patient with direct contact information for care management team Evaluation of current treatment plan related to symptomatic anemia and patient's adherence to plan as established by provider Reviewed medications with patient and discussed importance of taking as prescribed Reviewed scheduled/upcoming provider appointments including hematologist 05/13/24, GI 05/24/24 Reviewed signs/ symptoms GI bleeding, action plan, importance of reporting immediately Reinforced HF action plan  Patient Self Care Activities:  Attend all scheduled provider appointments Attend church or other social activities Call pharmacy for medication refills 3-7 days in advance of running out of medications Notify RN Care Manager of TOC call rescheduling needs Participate in Transition of Care Program/Attend TOC scheduled calls Perform all self care activities independently  Take medications as prescribed   Continue  to weigh daily and keep a log  Plan:  Telephone follow up appointment with care management team member scheduled for:  05/15/24 @ 115 pm The patient has been provided with contact information for the care management team and has been advised to call with any health related questions or concerns.         Patient verbalizes understanding of instructions and care plan provided today and agrees to view in MyChart. Active MyChart status and patient understanding of how to access instructions and care plan via MyChart confirmed with patient.     Telephone follow up appointment with care management team member scheduled for: 05/15/24 @ 115 pm  Please call the care guide team at [phone] if you need to cancel or reschedule your appointment.   Please call the Suicide and Crisis Lifeline: 988 call the [place]  National Suicide Prevention Lifeline: 631-714-8786 or TTY: (704)556-1140 TTY 636-750-4622) to talk to a trained counselor call 1-800-273-TALK (toll free, 24 hour hotline) go to [facility] [address], [place] [phone]) call the [facility] Crisis Line: [phone] call 911 if you are experiencing a Mental Health or Behavioral Health Crisis or need someone to talk to.  Mliss Creed [facility], BSN RN Care Manager/ Transition of Care [place]/ [facility] [phone]

## 2024-05-07 NOTE — Patient Outreach (Signed)
 Transition of Care week 3  Visit Note  05/07/2024  Name: [name] [name] MRN: [id number]          DOB: 1941-01-21  Situation: Patient enrolled in [facility] 30-day program. Visit completed with patient by telephone.   Background:   Initial Transition Care Management Follow-up Telephone Call Discharge Date and Diagnosis: 04/22/24, symptomatic anemia   Past Medical History:  Diagnosis Date   Anemia    Arthritis    Ostearthritis- hips, knees, fingers   Atrial fibrillation (HCC)    Basal cell carcinoma (BCC) of right hand 10/2021   Dx by [facility]   Blood transfusion without reported diagnosis 2021   Chronic kidney disease    DVT (deep venous thrombosis) (HCC)    Dyspnea    Dysrhythmia    GERD (gastroesophageal reflux disease)    Headache(784.0)    tx. Valproic  acid   History of diabetes mellitus    History of kidney stones    Hypertension    Hypothyroidism    Presence of Watchman left atrial appendage closure device 06/15/2023   24mm Watchman FLX Pro device placed by Dr. Cindie   Pulmonary embolism [facility])     Assessment: Patient Reported Symptoms: Cognitive Cognitive Status: No symptoms reported, Alert and oriented to person, place, and time, Able to follow simple commands, Normal speech and language skills      Neurological Neurological Review of Symptoms: No symptoms reported    HEENT HEENT Symptoms Reported: No symptoms reported      Cardiovascular Cardiovascular Symptoms Reported: No symptoms reported Weight: 195 lb (88.5 kg) Cardiovascular Self-Management Outcome: 4 (good)  Respiratory Respiratory Symptoms Reported: No symptoms reported    Endocrine Endocrine Symptoms Reported: No symptoms reported Is patient diabetic?: No    Gastrointestinal Gastrointestinal Symptoms Reported: No symptoms reported Additional Gastrointestinal Details: pt denies any GI bleeding Gastrointestinal Management Strategies: Adequate rest Gastrointestinal Self-Management  Outcome: 3 (uncertain) Gastrointestinal Comment: to follow up with Dr. Cindie 10/30, reviewed signs/ symptoms GI Bleeding    Genitourinary Genitourinary Symptoms Reported: No symptoms reported    Integumentary Integumentary Symptoms Reported: No symptoms reported    Musculoskeletal Musculoskelatal Symptoms Reviewed: Limited mobility Additional Musculoskeletal Details: uses walker as needed Musculoskeletal Management Strategies: Adequate rest, Medical device Musculoskeletal Self-Management Outcome: 4 (good) Musculoskeletal Comment: reviewed safety precautions      Psychosocial Psychosocial Symptoms Reported: No symptoms reported         There were no vitals filed for this visit.  Medications Reviewed Today     Reviewed by Aura Mliss LABOR, RN (Registered Nurse) on 05/07/24 at 1316  Med List Status: <None>   Medication Order Taking? Sig Documenting Provider Last Dose Status Informant  acetaminophen  (TYLENOL ) 325 MG tablet [id number]  Take 2 tablets (650 mg total) by mouth every 6 (six) hours as needed for mild pain or headache (or Fever >/= 101). Pearlean Manus, MD  Active Self, Pharmacy Records  allopurinol  (ZYLOPRIM ) 100 MG tablet [id number]  Take 1 tablet by mouth once daily Joesph Annabella HERO, FNP  Active Self, Pharmacy Records  apixaban  (ELIQUIS ) 5 MG TABS tablet [id number]  Take 1 tablet (5 mg total) by mouth 2 (two) times daily.  Patient not taking: Reported on 04/23/2024   Raenelle Donalda HERO, MD  Active   atorvastatin  (LIPITOR) 20 MG tablet [id number]  Take 1 tablet (20 mg total) by mouth daily. Joesph Annabella HERO, FNP  Active Self, Pharmacy Records  cetirizine  (ZYRTEC ) 10 MG tablet [id number]  Take 10 mg by mouth daily.  [provider]  Active Self, Pharmacy Records  dapagliflozin  propanediol (FARXIGA ) 10 MG TABS tablet [id number]  Take 1 tablet (10 mg total) by mouth daily. Joesph Annabella HERO, FNP  Active Self, Pharmacy Records  ferrous sulfate  325 (65 FE) MG EC tablet  [id number]  Take 325 mg by mouth daily. [provider]  Active Self, Pharmacy Records  furosemide  (LASIX ) 20 MG tablet [id number]  Take 1 tablet (20 mg total) by mouth daily as needed for fluid or edema. Cheryle Page, MD  Active Self, Pharmacy Records  [facility] CRANBERRY EXTRACT PO [id number]  Take 1 tablet by mouth in the morning. [provider]  Active Self, Pharmacy Records  levothyroxine  (SYNTHROID ) 50 MCG tablet [id number]  Take 1 tablet (50 mcg total) by mouth daily before breakfast. Joesph Annabella HERO, FNP  Active Self, Pharmacy Records  metoprolol  succinate (TOPROL -XL) 200 MG 24 hr tablet [id number]  Take 1 tablet (200 mg total) by mouth daily. Ghimire, Donalda HERO, MD  Active   oxybutynin  (DITROPAN -XL) 10 MG 24 hr tablet [id number]  Take 1 tablet (10 mg total) by mouth at bedtime. Joesph Annabella HERO, FNP  Active Self, Pharmacy Records  pantoprazole  (PROTONIX ) 40 MG tablet [id number]  Take 1 tablet (40 mg total) by mouth 2 (two) times daily before a meal. Joesph Annabella HERO, FNP  Active Self, Pharmacy Records  polyethylene glycol powder (GLYCOLAX /MIRALAX ) 17 GM/SCOOP powder [id number]  Take 1 capful (17 g) by mouth daily as needed for moderate constipation. Cheryle Page, MD  Active Self, Pharmacy Records            Goals Addressed             This Visit's Progress    VBCI Transitions of Care (TOC) Care Plan       Problems:  Recent Hospitalization for treatment of symptomatic anemia No Hospital Follow Up Provider appointment pt declines for RN CM to schedule post hospital follow up, pt has scheduled specialist appointments  Pt weighs daily 195 pounds today, no new concerns reported Adult son lives with pt  Goal:  Over the next 30 days, the patient will not experience hospital readmission  Interventions:  Evaluation of current treatment plan related to symptomatic anemia,  self-management and patient's adherence to plan as established by provider. Discussed plans with  patient for ongoing care management follow up and provided patient with direct contact information for care management team Evaluation of current treatment plan related to symptomatic anemia and patient's adherence to plan as established by provider Reviewed medications with patient and discussed importance of taking as prescribed Reviewed scheduled/upcoming provider appointments including hematologist 05/13/24, GI 05/24/24 Reviewed signs/ symptoms GI bleeding, action plan, importance of reporting immediately Reinforced HF action plan  Patient Self Care Activities:  Attend all scheduled provider appointments Attend church or other social activities Call pharmacy for medication refills 3-7 days in advance of running out of medications Notify RN Care Manager of TOC call rescheduling needs Participate in Transition of Care Program/Attend TOC scheduled calls Perform all self care activities independently  Take medications as prescribed   Continue to weigh daily and keep a log  Plan:  Telephone follow up appointment with care management team member scheduled for:  05/15/24 @ 115 pm The patient has been provided with contact information for the care management team and has been advised to call with any health related questions or concerns.          Recommendation:   PCP Follow-up  Follow Up Plan:  Telephone follow-up 05/15/24 @ 115 pm  Mliss Creed [facility], BSN RN Care Manager/ Transition of Care [place] [phone]

## 2024-05-08 LAB — MISC LABCORP TEST (SEND OUT): Labcorp test code: 81950

## 2024-05-11 NOTE — Progress Notes (Unsigned)
 [facility] [address] [place] [address]   CLINIC:  Medical Oncology/Hematology  PCP:  Joesph Annabella HERO, FNP [address] [place] [address] [phone]   REASON FOR VISIT:  Follow-up for iron deficiency anemia   PRIOR THERAPY: Blood transfusions and oral iron tablets   CURRENT THERAPY: Intermittent IV iron   *** RESUME PREP BELOW ***    INTERVAL HISTORY:  [name] is contacted today for follow-up of iron deficiency anemia related to chronic GI blood loss.  She was last seen by Pleasant Barefoot PA-C on 09/27/2022.  She contacted clinic in May 2024 to report profound fatigue, and labs at that time showed worsening anemia with Hgb 7.8, ferritin 14, and iron saturation 5%.  She received PRBC transfusion on 12/15/2022.  She has required 5 doses of IV Feraheme  since her last visit, with most recent given on 01/06/2023.  At today's visit, she reports feeling fair. She reports improved energy and strength after her blood transfusion and iron infusions, but still having significant fatigue.  She is also having frequent UTIs, just started her third antibiotic in the past three months.  She had gross hematuria with her UTI in May 2024, which cleared up after she received antibiotics.   She has had some recurrent black and sticky bowel movements intermittently over the past week.  She does report some worsening dyspnea on exertion for the past 2 weeks.   She reports dyspnea on exertion, headaches, and restless legs.   Denies any pica, lightheadedness, syncope, chest pain.   She is taking iron tablet once daily with mild constipation.  She remains on Eliquis  5 mg BID for history of unprovoked DVT and PE in 2020.    She reports 35% energy and 75% appetite.  She reports that she is maintaining a stable weight.   ASSESSMENT & PLAN:  1.  Iron deficiency anemia secondary to GI blood loss - Normocytic anemia secondary to chronic blood loss, may also  have some anemia related to CKD stage IIIb - Hematology work-up (02/24/2022):  Ferritin 11, iron saturation 7%.  Normal folate, B12, MMA, copper . 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  Normal duodenum and jejunum. - Colonoscopy (12/08/2019): Diverticulosis and polyps - She reports melanotic stool over the past week.  She had 3 days of gross hematuria in May 2024, associated with UTI. - She is on Eliquis  5 mg twice daily.  She takes iron tablet once daily. - Required 5 doses of IV Feraheme  since her last visit, with most recent given on 01/06/2023. - Most recent labs (01/09/2023): Hgb 9.1/MCV 103.4, ferritin 426, iron saturation 33 % (NOTE: Received IV iron 3 days prior to labs being drawn) - PLAN: We will watch closely with CBC + BB sample every 2 weeks. -RTC for office visit and repeat CBC/D and iron panel in 6 weeks - Continue iron tablet daily. - Patient requires multidisciplinary follow-up of her anemia: Advised patient to reach out to Dr. Cindie regarding her recurrent melena.  (She is aware of alarm symptoms that would prompt immediate ED evaluation.) Advised patient to reach out to her urologist regarding recurrent UTIs and possible hemorrhagic cystitis. If patient continues to have significant bleeding, we  may need to involve cardiology and discussion about continuing Eliquis  versus referring for Watchman device instead.  (If anticoagulation were discontinued, she would still be at risk for recurrent VTE though given her history of unprovoked DVT/PE)   2.   Unprovoked right leg DVT and PE: - Doppler on 04/03/2019: DVT in the femoral vein and calf veins. - CT chest PE protocol (04/03/2019):  Moderate size pulmonary emboli in the right lower lobe. - She is on Eliquis  5 mg twice daily, also for Afib.   -- She sees Dr. Lavona for cardiology.    3.  Social/family history: - She lives at home with her older son.  She takes care of the garden and does laundry and her ADLs and IADLs.  But lately her energy levels are low and she is not able to do them.  She was a retired tobacco former.  Never smoked. - Sister had anemia.  Mother had uterine cancer and sister had colon cancer.  PLAN SUMMARY: >> CBC/D+ BB sample every 2 weeks >> Labs in 6 weeks = CBC/D, ferritin, iron/TIBC >> OFFICE visit in 6 weeks - same day as labs     REVIEW OF SYSTEMS: ***  Review of Systems - Oncology   PHYSICAL EXAM:  ECOG PERFORMANCE STATUS: {CHL ONC ECOG ED:[id number]} *** There were no vitals filed for this visit. There were no vitals filed for this visit. Physical Exam  PAST MEDICAL/SURGICAL HISTORY:  Past Medical History:  Diagnosis Date   Anemia    Arthritis    Ostearthritis- hips, knees, fingers   Atrial fibrillation (HCC)    Basal cell carcinoma (BCC) of right hand 10/2021   Dx by [facility]   Blood transfusion without reported diagnosis 2021   Chronic kidney disease    DVT (deep venous thrombosis) (HCC)    Dyspnea    Dysrhythmia    GERD (gastroesophageal reflux disease)    Headache(784.0)    tx. Valproic  acid   History of diabetes mellitus    History of kidney stones    Hypertension    Hypothyroidism    Presence of Watchman left atrial appendage closure device 06/15/2023   24mm Watchman FLX Pro device placed by Dr. Cindie   Pulmonary embolism [facility])    Past Surgical History:  Procedure Laterality Date   ABDOMINAL HYSTERECTOMY     BILATERAL HIP ARTHROSCOPY Left    BIOPSY  12/08/2019   Procedure: BIOPSY;  Surgeon: Shaaron Lamar HERO, MD;  [facility];  Service: Endoscopy;;   CATARACT EXTRACTION, BILATERAL     COLONOSCOPY N/A 12/08/2019   polyps (tubular  adenoma), diverticulosis, colonic lipoma, no surveillance due to age   CYSTOSCOPY W/ URETERAL STENT PLACEMENT Right 12/25/2020   Procedure: CYSTOSCOPY WITH RETROGRADE PYELOGRAM/URETERAL STENT PLACEMENT;  Surgeon: Selma Donnice SAUNDERS, MD;  Location: [facility];  Service: Urology;  Laterality: Right;   CYSTOSCOPY/URETEROSCOPY/HOLMIUM LASER/STENT PLACEMENT Right 01/15/2021   Procedure: CYSTOSCOPY/RETROGRADE/URETEROSCOPY/HOLMIUM LASER/STENT EXCHANGE;  Surgeon: Selma Donnice SAUNDERS, MD;  Location: [facility];  Service: Urology;  Laterality: Right;   ESOPHAGOGASTRODUODENOSCOPY N/A 12/08/2019   normal esophagus with 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   ESOPHAGOGASTRODUODENOSCOPY N/A 04/21/2024   Procedure: EGD (ESOPHAGOGASTRODUODENOSCOPY);  Surgeon: Albertus Gordy HERO, MD;  Location[facility] ENDOSCOPY;  Service: Gastroenterology;  Laterality: N/A;   ESOPHAGOGASTRODUODENOSCOPY (EGD) WITH PROPOFOL  N/A 05/18/2021   Surgeon: Magod, Marc, MD;   Tiny hiatal hernia, gastric antral vascular ectasia s/p ablation, normal duodenum, normal jejunum.   ESOPHAGOGASTRODUODENOSCOPY (EGD) WITH PROPOFOL   N/A 03/10/2022   Procedure: ESOPHAGOGASTRODUODENOSCOPY (EGD) WITH PROPOFOL ;  Surgeon: Cindie Carlin POUR, DO;  [facility];  Service: Endoscopy;  Laterality: N/A;  10:45am   ESOPHAGOGASTRODUODENOSCOPY (EGD) WITH PROPOFOL  N/A 02/01/2023   Procedure: ESOPHAGOGASTRODUODENOSCOPY (EGD) WITH PROPOFOL ;  Surgeon: Cindie Carlin POUR, DO;  [facility];  Service: Endoscopy;  Laterality: N/A;  9:00 am, asa 3   EYE SURGERY  2015   Cataracts   GIVENS CAPSULE STUDY N/A 01/13/2020   Procedure: GIVENS CAPSULE STUDY;  Surgeon: Shaaron Lamar HERO, MD;  [facility];  Service: Endoscopy;  Laterality: N/A;  7:30am   GIVENS CAPSULE STUDY N/A 05/04/2021   Procedure: GIVENS CAPSULE STUDY;  Surgeon: Rosalie Kitchens, MD;  [facility];  Service: Endoscopy;  Laterality: N/A;   JOINT REPLACEMENT  2012 2015    Hips   LEFT ATRIAL APPENDAGE OCCLUSION N/A 06/15/2023   Procedure: LEFT ATRIAL APPENDAGE OCCLUSION;  Surgeon: Cindie Ole DASEN, MD;  Location: [facility] INVASIVE CV LAB;  Service: Cardiovascular;  Laterality: N/A;   MULTIPLE TOOTH EXTRACTIONS     60's   PARATHYROIDECTOMY     POLYPECTOMY  12/08/2019   Procedure: POLYPECTOMY;  Surgeon: Shaaron Lamar HERO, MD;  [facility];  Service: Endoscopy;;   POLYPECTOMY  02/01/2023   Procedure: POLYPECTOMY;  Surgeon: Cindie Carlin POUR, DO;  [facility];  Service: Endoscopy;;   RADIOFREQUENCY ABLATION  05/18/2021   Procedure: RADIO FREQUENCY ABLATION;  Surgeon: Rosalie Kitchens, MD;  [facility];  Service: Endoscopy;;   TEE WITHOUT CARDIOVERSION N/A 07/06/2021   Procedure: TRANSESOPHAGEAL ECHOCARDIOGRAM (TEE);  Surgeon: Kate Lonni CROME, MD;  Location: [facility] ENDOSCOPY;  Service: Cardiovascular;  Laterality: N/A;   TEE WITHOUT CARDIOVERSION N/A 06/15/2023   Procedure: TRANSESOPHAGEAL ECHOCARDIOGRAM;  Surgeon: Cindie Ole DASEN, MD;  Location: [facility] INVASIVE CV LAB;  Service: Cardiovascular;  Laterality: N/A;   TOTAL HIP ARTHROPLASTY Right 10/29/2013   Procedure: RIGHT TOTAL HIP ARTHROPLASTY ANTERIOR APPROACH;  Surgeon: Donnice JONETTA Car, MD;  Location: [facility];  Service: Orthopedics;  Laterality: Right;   TRANSESOPHAGEAL ECHOCARDIOGRAM (CATH LAB) N/A 08/18/2023   Procedure: TRANSESOPHAGEAL ECHOCARDIOGRAM;  Surgeon: Francyne Headland, MD;  [facility]: [facility] CV [facility];  Service: Cardiovascular;  Laterality: N/A;   TUBAL LIGATION  1972    SOCIAL HISTORY:  Social History   Socioeconomic History   Marital status: Widowed    Spouse name: Not on file   Number of children: 4   Years of education: 82   Highest education level: 12th grade  Occupational History   Occupation: Retired    Comment: Tobacco Farming  Tobacco Use   Smoking status: Never   Smokeless tobacco: Never  Vaping Use   Vaping status: Never Used  Substance and  Sexual Activity   Alcohol  use: No   Drug use: No   Sexual activity: Not Currently    Birth control/protection: None  Other Topics Concern   Not on file  Social History Narrative   Patient is widowed and lives in a one story home. She has four adult children and one son lives with her.       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   Social Drivers of Corporate investment banker Strain: Low Risk  (01/23/2024)   Overall Financial Resource Strain (CARDIA)    Difficulty of Paying Living Expenses: Not very hard  Food Insecurity: No Food Insecurity (04/23/2024)   Hunger Vital Sign    Worried About Running Out  of Food in the Last Year: Never true    Ran Out of Food in the Last Year: Never true  Transportation Needs: No Transportation Needs (04/23/2024)   PRAPARE - Administrator, Civil Service (Medical): No    Lack of Transportation (Non-Medical): No  Physical Activity: Insufficiently Active (01/23/2024)   Exercise Vital Sign    Days of Exercise per Week: 1 day    Minutes of Exercise per Session: 10 min  Stress: No Stress Concern Present (11/23/2023)   [employer] of Occupational Health - Occupational Stress Questionnaire    Feeling of Stress : Not at all  Social Connections: Moderately Isolated (04/20/2024)   Social Connection and Isolation Panel    Frequency of Communication with Friends and Family: Patient unable to answer    Frequency of Social Gatherings with Friends and Family: More than three times a week    Attends Religious Services: 1 to 4 times per year    Active Member of [employer] or Organizations: No    Attends Banker Meetings: Never    Marital Status: Widowed  Intimate Partner Violence: Not At Risk (04/23/2024)   Humiliation, Afraid, Rape, and Kick questionnaire    Fear of Current or Ex-Partner: No    Emotionally Abused: No    Physically Abused: No    Sexually Abused: No    FAMILY HISTORY:  Family History  Problem Relation Age of Onset   Colitis  Sister 12       alive   Cancer Sister 75       colon   Cancer Mother 72       uterine   Heart disease Father 38       heart failure   Heart attack Brother 24   Healthy Daughter    Healthy Son    Pulmonary embolism Brother 46   Heart disease Brother    Healthy Son    Healthy Son     CURRENT MEDICATIONS:  Outpatient Encounter Medications as of 05/13/2024  Medication Sig   acetaminophen  (TYLENOL ) 325 MG tablet Take 2 tablets (650 mg total) by mouth every 6 (six) hours as needed for mild pain or headache (or Fever >/= 101).   allopurinol  (ZYLOPRIM ) 100 MG tablet Take 1 tablet by mouth once daily   apixaban  (ELIQUIS ) 5 MG TABS tablet Take 1 tablet (5 mg total) by mouth 2 (two) times daily. (Patient not taking: Reported on 04/23/2024)   atorvastatin  (LIPITOR) 20 MG tablet Take 1 tablet (20 mg total) by mouth daily.   cetirizine  (ZYRTEC ) 10 MG tablet Take 10 mg by mouth daily.   dapagliflozin  propanediol (FARXIGA ) 10 MG TABS tablet Take 1 tablet (10 mg total) by mouth daily.   ferrous sulfate  325 (65 FE) MG EC tablet Take 325 mg by mouth daily.   furosemide  (LASIX ) 20 MG tablet Take 1 tablet (20 mg total) by mouth daily as needed for fluid or edema.   [facility] CRANBERRY EXTRACT PO Take 1 tablet by mouth in the morning.   levothyroxine  (SYNTHROID ) 50 MCG tablet Take 1 tablet (50 mcg total) by mouth daily before breakfast.   metoprolol  succinate (TOPROL -XL) 200 MG 24 hr tablet Take 1 tablet (200 mg total) by mouth daily.   oxybutynin  (DITROPAN -XL) 10 MG 24 hr tablet Take 1 tablet (10 mg total) by mouth at bedtime.   pantoprazole  (PROTONIX ) 40 MG tablet Take 1 tablet (40 mg total) by mouth 2 (two) times daily before a meal.   polyethylene  glycol powder (GLYCOLAX /MIRALAX ) 17 GM/SCOOP powder Take 1 capful (17 g) by mouth daily as needed for moderate constipation.   No facility-administered encounter medications on file as of 05/13/2024.    ALLERGIES:  No Known Allergies  LABORATORY DATA:  I  have reviewed the labs as listed.  CBC    Component Value Date/Time   WBC 4.5 05/06/2024 1049   RBC 3.04 (L) 05/06/2024 1049   HGB 8.2 (L) 05/06/2024 1049   HGB 4.6 (LL) 04/19/2024 1632   HCT 28.9 (L) 05/06/2024 1049   HCT 17.7 (LL) 04/19/2024 1632   PLT 314 05/06/2024 1049   PLT 332 04/19/2024 1632   MCV 95.1 05/06/2024 1049   MCV 84 04/19/2024 1632   MCH 27.0 05/06/2024 1049   MCHC 28.4 (L) 05/06/2024 1049   RDW 19.7 (H) 05/06/2024 1049   RDW 17.9 (H) 04/19/2024 1632   LYMPHSABS 0.9 05/06/2024 1049   LYMPHSABS 1.3 01/24/2024 1218   MONOABS 0.6 05/06/2024 1049   EOSABS 0.1 05/06/2024 1049   EOSABS 0.1 01/24/2024 1218   BASOSABS 0.1 05/06/2024 1049   BASOSABS 0.0 01/24/2024 1218      Latest Ref Rng & Units 05/06/2024   10:49 AM 04/21/2024    6:54 AM 04/20/2024    7:22 AM  CMP  Glucose 70 - 99 mg/dL 898  897  894   BUN 8 - 23 mg/dL 30  34  36   Creatinine 0.44 - 1.00 mg/dL 8.76  8.53  8.59   Sodium 135 - 145 mmol/L 140  141  140   Potassium 3.5 - 5.1 mmol/L 4.7  3.9  3.5   Chloride 98 - 111 mmol/L 107  113  109   CO2 22 - 32 mmol/L 25  18  20    Calcium  8.9 - 10.3 mg/dL 88.7  89.4  89.2   Total Protein 6.5 - 8.1 g/dL 5.7   5.2   Total Bilirubin 0.0 - 1.2 mg/dL 0.3   1.4   Alkaline Phos 38 - 126 U/L 94   67   AST 15 - 41 U/L 12   14   ALT 0 - 44 U/L 9   8     DIAGNOSTIC IMAGING:  I have independently reviewed the relevant imaging and discussed with the patient.   WRAP UP:  All questions were answered. The patient knows to call the clinic with any problems, questions or concerns.  Medical decision making: ***  Time spent on visit: I spent *** minutes counseling the patient face to face. The total time spent in the appointment was *** minutes and more than 50% was on counseling.  Pleasant CHRISTELLA Barefoot, PA-C  ***

## 2024-05-13 ENCOUNTER — Inpatient Hospital Stay: Admitting: Physician Assistant

## 2024-05-13 VITALS — BP 134/79 | HR 89 | Temp 98.4°F | Resp 18

## 2024-05-13 DIAGNOSIS — D509 Iron deficiency anemia, unspecified: Secondary | ICD-10-CM | POA: Diagnosis not present

## 2024-05-13 DIAGNOSIS — Z86711 Personal history of pulmonary embolism: Secondary | ICD-10-CM

## 2024-05-13 DIAGNOSIS — D500 Iron deficiency anemia secondary to blood loss (chronic): Secondary | ICD-10-CM

## 2024-05-13 DIAGNOSIS — E538 Deficiency of other specified B group vitamins: Secondary | ICD-10-CM | POA: Diagnosis not present

## 2024-05-13 DIAGNOSIS — Z7901 Long term (current) use of anticoagulants: Secondary | ICD-10-CM

## 2024-05-13 MED ORDER — CYANOCOBALAMIN 500 MCG PO TABS: 500.0000 ug | ORAL_TABLET | Freq: Every day | ORAL | 3 refills | Status: AC

## 2024-05-13 NOTE — Patient Instructions (Signed)
 [facility] at [facility] **VISIT SUMMARY & IMPORTANT INSTRUCTIONS **   You were seen today by Pleasant Barefoot PA-C for your follow-up visit.    ANEMIA: The majority of your anemia is likely due to chronic blood loss from your stomach, which causes iron deficiency.  You may also have some aspects of anemia from chronic kidney disease, as illustrated below.  We will schedule you for IV Feraheme  x 2. Continue to take iron tablet daily. START taking vitamin B12 500 mcg daily. Due to your high risk for ongoing bleeding, we will check your blood count (CBC every 2 weeks).  You will receive blood transfusion if your hemoglobin is <8.0. We will see you back for follow-up in about 2 months.  If you continue to have hemoglobin <10 despite adequate iron and B12, we would consider starting you on injections to help treat your anemia related to chronic kidney disease.  HISTORY OF BLOOD CLOTS: Due to your recurrent pulmonary embolisms, you are at very high risk of having life-threatening blood clots in the future, unless you remain on blood thinners.  Therefore, we recommend lifelong blood thinners, although this does increase your risk of bleeding from your stomach and intestines.  OTHER CONCERNS Please speak with your cardiologist regarding the pain between your shoulder blades that is worse when your blood count is low or when you exert yourself.  This could be an atypical presentation of heart pain. Seek immediate medical attention if you have any black tarry bowel movements, bright red blood in your stool, severe shortness of breath with exertion, or chest pain.  FOLLOW-UP APPOINTMENT: 2 months  ** Thank you for trusting me with your healthcare!  I strive to provide all of my patients with quality care at each visit.  If you receive a survey for this visit, I would be so grateful to you for taking the time to provide feedback.  Thank you in advance!  ~ Marilene Vath                                         Dr. Mickiel Davonna Pleasant Barefoot, PA-C          Delon Hope, NP   - - - - - - - - - - - - - - - - - -    Thank you for choosing [facility] at [facility] to provide your oncology and hematology care.  To afford each patient quality time with our provider, please arrive at least 15 minutes before your scheduled appointment time.   If you have a lab appointment with the Cancer Center please come in thru the Main Entrance and check in at the main information desk.  You need to re-schedule your appointment should you arrive 10 or more minutes late.  We strive to give you quality time with our providers, and arriving late affects you and other patients whose appointments are after yours.  Also, if you no show three or more times for appointments you may be dismissed from the clinic at the providers discretion.     Again, thank you for choosing [facility].  Our hope is that these requests will decrease the amount of time that you wait before being seen by our physicians.       _____________________________________________________________  Should you  have questions after your visit to [facility], please contact our office at [phone] and follow the prompts.  Our office hours are 8:00 a.m. and 4:30 p.m. Monday - Friday.  Please note that voicemails left after 4:00 p.m. may not be returned until the following business day.  We are closed weekends and major holidays.  You do have access to a nurse 24-7, just call the main number to the clinic [phone] and do not press any options, hold on the line and a nurse will answer the phone.    For prescription refill requests, have your pharmacy contact our office and allow 72 hours.

## 2024-05-15 ENCOUNTER — Other Ambulatory Visit: Payer: Self-pay | Admitting: *Deleted

## 2024-05-15 NOTE — Patient Instructions (Signed)
 Visit Information  Thank you for taking time to visit with me today. Please don't hesitate to contact me if I can be of assistance to you before our next scheduled telephone appointment.  Our next appointment is by telephone on 05/22/24 @ 1045 am  Following is a copy of your care plan:   Goals Addressed             This Visit's Progress    VBCI Transitions of Care (TOC) Care Plan       Problems:  Recent Hospitalization for treatment of symptomatic anemia No Hospital Follow Up Provider appointment pt declines for RN CM to schedule post hospital follow up, pt has scheduled specialist appointments  Pt weighs daily 196 pounds today, has been eating more, no new concerns reported, saw hematologist 05/15/24, scheduled for iron infusion 10/23, will have CBC checked q 2 weeks Adult son lives with pt  Goal:  Over the next 30 days, the patient will not experience hospital readmission  Interventions:  Evaluation of current treatment plan related to symptomatic anemia,  self-management and patient's adherence to plan as established by provider. Discussed plans with patient for ongoing care management follow up and provided patient with direct contact information for care management team Reviewed all upcoming scheduled appointments including iron infusion 05/16/24 Reviewed signs/ symptoms GI bleeding, action plan, importance of reporting immediately Reinforced HF action plan  Patient Self Care Activities:  Attend all scheduled provider appointments Attend church or other social activities Call pharmacy for medication refills 3-7 days in advance of running out of medications Notify RN Care Manager of TOC call rescheduling needs Participate in Transition of Care Program/Attend TOC scheduled calls Perform all self care activities independently  Take medications as prescribed   Continue to weigh daily and keep a log  Plan:  Telephone follow up appointment with care management team member  scheduled for:  05/22/24 @ 1045 am The patient has been provided with contact information for the care management team and has been advised to call with any health related questions or concerns.         Patient verbalizes understanding of instructions and care plan provided today and agrees to view in MyChart. Active MyChart status and patient understanding of how to access instructions and care plan via MyChart confirmed with patient.     Telephone follow up appointment with care management team member scheduled for: 05/22/24 @ 1045 am  Please call the care guide team at [phone] if you need to cancel or reschedule your appointment.   Please call the Suicide and Crisis Lifeline: 988 call the [place]  National Suicide Prevention Lifeline: (605)809-5667 or TTY: (475)607-8589 TTY 903-355-9956) to talk to a trained counselor call 1-800-273-TALK (toll free, 24 hour hotline) go to [facility] [address] [phone]) call the [facility] Crisis Line: [phone] call 911 if you are experiencing a Mental Health or Behavioral Health Crisis or need someone to talk to.  Mliss Creed [facility], BSN RN Care Manager/ Transition of Care [place]/ [facility] [phone]

## 2024-05-15 NOTE — Patient Outreach (Signed)
 Transition of Care week 4  Visit Note  05/15/2024  Name: [name] [name] MRN: [id number]          DOB: 06-02-1941  Situation: Patient enrolled in [facility] 30-day program. Visit completed with patient by telephone.   Background:  Discharge Date and Diagnosis: 04/22/24, symptomatic anemia   Past Medical History:  Diagnosis Date   Anemia    Arthritis    Ostearthritis- hips, knees, fingers   Atrial fibrillation (HCC)    Basal cell carcinoma (BCC) of right hand 10/2021   Dx by [facility]   Blood transfusion without reported diagnosis 2021   Chronic kidney disease    DVT (deep venous thrombosis) (HCC)    Dyspnea    Dysrhythmia    GERD (gastroesophageal reflux disease)    Headache(784.0)    tx. Valproic  acid   History of diabetes mellitus    History of kidney stones    Hypertension    Hypothyroidism    Presence of Watchman left atrial appendage closure device 06/15/2023   24mm Watchman FLX Pro device placed by Dr. Cindie   Pulmonary embolism [facility])     Assessment: Patient Reported Symptoms: Cognitive Cognitive Status: No symptoms reported, Alert and oriented to person, place, and time, Normal speech and language skills, Able to follow simple commands      Neurological Neurological Review of Symptoms: No symptoms reported    HEENT HEENT Symptoms Reported: No symptoms reported      Cardiovascular Cardiovascular Symptoms Reported: No symptoms reported    Respiratory Respiratory Symptoms Reported: No symptoms reported    Endocrine Endocrine Symptoms Reported: No symptoms reported Is patient diabetic?: No    Gastrointestinal Gastrointestinal Symptoms Reported: No symptoms reported Additional Gastrointestinal Details: pt denies any GI bleeding, pt saw hematologist on 10/20 and will be receiving iron infusion tomorrow 10/23 Gastrointestinal Management Strategies: Adequate rest Gastrointestinal Self-Management Outcome: 3 (uncertain) Gastrointestinal Comment: will see  Dr. Cindie  GI on 10/30, reinforced signs/ symptoms GI bleeding    Genitourinary Genitourinary Symptoms Reported: No symptoms reported    Integumentary Integumentary Symptoms Reported: No symptoms reported    Musculoskeletal Musculoskelatal Symptoms Reviewed: Limited mobility Additional Musculoskeletal Details: walker as needed Musculoskeletal Management Strategies: Adequate rest, Medical device Musculoskeletal Self-Management Outcome: 4 (good) Musculoskeletal Comment: reinforced safety precautions      Psychosocial Psychosocial Symptoms Reported: No symptoms reported         There were no vitals filed for this visit.  Medications Reviewed Today     Reviewed by Aura Mliss LABOR, RN (Registered Nurse) on 05/15/24 at 1332  Med List Status: <None>   Medication Order Taking? Sig Documenting Provider Last Dose Status Informant  acetaminophen  (TYLENOL ) 325 MG tablet [id number]  Take 2 tablets (650 mg total) by mouth every 6 (six) hours as needed for mild pain or headache (or Fever >/= 101). Pearlean Manus, MD  Active Self, Pharmacy Records  allopurinol  (ZYLOPRIM ) 100 MG tablet [id number]  Take 1 tablet by mouth once daily Joesph Annabella HERO, FNP  Active Self, Pharmacy Records  apixaban  (ELIQUIS ) 5 MG TABS tablet [id number]  Take 1 tablet (5 mg total) by mouth 2 (two) times daily. Raenelle Donalda HERO, MD  Active   atorvastatin  (LIPITOR) 20 MG tablet [id number]  Take 1 tablet (20 mg total) by mouth daily. Joesph Annabella HERO, FNP  Active Self, Pharmacy Records  cetirizine  (ZYRTEC ) 10 MG tablet [id number]  Take 10 mg by mouth daily. [provider]  Active Self, Pharmacy Records  cyanocobalamin  (V-R VITAMIN  B-12) 500 MCG tablet [id number]  Take 1 tablet (500 mcg total) by mouth daily. Lamon Herter M, PA-C  Active   dapagliflozin  propanediol (FARXIGA ) 10 MG TABS tablet [id number]  Take 1 tablet (10 mg total) by mouth daily. Joesph Annabella HERO, FNP  Active Self, Pharmacy Records  ferrous  sulfate 325 (65 FE) MG EC tablet [id number]  Take 325 mg by mouth daily. [provider]  Active Self, Pharmacy Records  furosemide  (LASIX ) 20 MG tablet [id number]  Take 1 tablet (20 mg total) by mouth daily as needed for fluid or edema. Cheryle Page, MD  Active Self, Pharmacy Records  [facility] CRANBERRY EXTRACT PO [id number]  Take 1 tablet by mouth in the morning. [provider]  Active Self, Pharmacy Records  levothyroxine  (SYNTHROID ) 50 MCG tablet [id number]  Take 1 tablet (50 mcg total) by mouth daily before breakfast. Joesph Annabella HERO, FNP  Active Self, Pharmacy Records  metoprolol  succinate (TOPROL -XL) 200 MG 24 hr tablet [id number]  Take 1 tablet (200 mg total) by mouth daily. Ghimire, Donalda HERO, MD  Active   oxybutynin  (DITROPAN -XL) 10 MG 24 hr tablet [id number]  Take 1 tablet (10 mg total) by mouth at bedtime. Joesph Annabella HERO, FNP  Active Self, Pharmacy Records  pantoprazole  (PROTONIX ) 40 MG tablet [id number]  Take 1 tablet (40 mg total) by mouth 2 (two) times daily before a meal. Joesph Annabella HERO, FNP  Active Self, Pharmacy Records  polyethylene glycol powder (GLYCOLAX /MIRALAX ) 17 GM/SCOOP powder [id number]  Take 1 capful (17 g) by mouth daily as needed for moderate constipation. Cheryle Page, MD  Active Self, Pharmacy Records            Goals Addressed             This Visit's Progress    VBCI Transitions of Care (TOC) Care Plan       Problems:  Recent Hospitalization for treatment of symptomatic anemia No Hospital Follow Up Provider appointment pt declines for RN CM to schedule post hospital follow up, pt has scheduled specialist appointments  Pt weighs daily 196 pounds today, has been eating more, no new concerns reported, saw hematologist 05/15/24, scheduled for iron infusion 10/23, will have CBC checked q 2 weeks Adult son lives with pt  Goal:  Over the next 30 days, the patient will not experience hospital readmission  Interventions:  Evaluation of  current treatment plan related to symptomatic anemia,  self-management and patient's adherence to plan as established by provider. Discussed plans with patient for ongoing care management follow up and provided patient with direct contact information for care management team Reviewed all upcoming scheduled appointments including iron infusion 05/16/24 Reviewed signs/ symptoms GI bleeding, action plan, importance of reporting immediately Reinforced HF action plan  Patient Self Care Activities:  Attend all scheduled provider appointments Attend church or other social activities Call pharmacy for medication refills 3-7 days in advance of running out of medications Notify RN Care Manager of TOC call rescheduling needs Participate in Transition of Care Program/Attend TOC scheduled calls Perform all self care activities independently  Take medications as prescribed   Continue to weigh daily and keep a log  Plan:  Telephone follow up appointment with care management team member scheduled for:  05/22/24 @ 1045 am The patient has been provided with contact information for the care management team and has been advised to call with any health related questions or concerns.          Recommendation:   PCP Follow-up  Follow Up Plan:   Telephone follow-up 05/22/24 @ 1045 am  Mliss Creed [facility], BSN RN Care Manager/ Transition of Care [place]/ [facility] [phone]

## 2024-05-15 NOTE — Telephone Encounter (Signed)
 MAILED 05/06/24

## 2024-05-16 ENCOUNTER — Inpatient Hospital Stay

## 2024-05-16 VITALS — BP 112/75 | HR 92 | Temp 97.1°F | Resp 18

## 2024-05-16 DIAGNOSIS — D509 Iron deficiency anemia, unspecified: Secondary | ICD-10-CM | POA: Diagnosis not present

## 2024-05-16 DIAGNOSIS — D500 Iron deficiency anemia secondary to blood loss (chronic): Secondary | ICD-10-CM

## 2024-05-16 MED ORDER — SODIUM CHLORIDE 0.9 % IV SOLN: INTRAVENOUS | Status: DC

## 2024-05-16 MED ORDER — SODIUM CHLORIDE 0.9 % IV SOLN
510.0000 mg | Freq: Once | INTRAVENOUS | Status: AC
  Administered 2024-05-16: 510 mg via INTRAVENOUS
  Filled 2024-05-16: qty 510

## 2024-05-16 NOTE — Progress Notes (Signed)
 Pt took tylenol  and zyrtec  today at home.   Feraheme  iron infusion given per orders. Patient tolerated it well without problems. Vitals stable and discharged home from clinic via wheelchair. Follow up as scheduled.

## 2024-05-16 NOTE — Patient Instructions (Signed)

## 2024-05-22 ENCOUNTER — Other Ambulatory Visit: Payer: Self-pay | Admitting: *Deleted

## 2024-05-22 NOTE — Patient Outreach (Signed)
 Transition of Care week 5  Visit Note  05/22/2024  Name: [name] [name] MRN: [id number]          DOB: [birth year]  Situation: Patient enrolled in [facility] 30-day program. Visit completed with patient by telephone.   Background:  Discharge Date and Diagnosis: 04/22/24, symptomatic anemia   Past Medical History:  Diagnosis Date   Anemia    Arthritis    Ostearthritis- hips, knees, fingers   Atrial fibrillation (HCC)    Basal cell carcinoma (BCC) of right hand 10/2021   Dx by [facility]   Blood transfusion without reported diagnosis 2021   Chronic kidney disease    DVT (deep venous thrombosis) (HCC)    Dyspnea    Dysrhythmia    GERD (gastroesophageal reflux disease)    Headache(784.0)    tx. Valproic  acid   History of diabetes mellitus    History of kidney stones    Hypertension    Hypothyroidism    Presence of Watchman left atrial appendage closure device 06/15/2023   24mm Watchman FLX Pro device placed by Dr. Cindie   Pulmonary embolism [facility])     Assessment: Patient Reported Symptoms: Cognitive Cognitive Status: No symptoms reported, Able to follow simple commands, Alert and oriented to person, place, and time, Normal speech and language skills      Neurological Neurological Review of Symptoms: No symptoms reported    HEENT HEENT Symptoms Reported: No symptoms reported      Cardiovascular Cardiovascular Symptoms Reported: No symptoms reported Weight: 195 lb (88.5 kg) Cardiovascular Self-Management Outcome: 4 (good)  Respiratory Respiratory Symptoms Reported: No symptoms reported    Endocrine Endocrine Symptoms Reported: No symptoms reported Is patient diabetic?: No    Gastrointestinal Gastrointestinal Symptoms Reported: No symptoms reported Additional Gastrointestinal Details: pt denies any GI bleeding, pt continues following up with hematologist and receiving iron infusion Gastrointestinal Management Strategies: Adequate rest Gastrointestinal  Self-Management Outcome: 3 (uncertain) Gastrointestinal Comment: reviewed signs/ symptoms GI bleeding    Genitourinary Genitourinary Symptoms Reported: No symptoms reported    Integumentary Integumentary Symptoms Reported: No symptoms reported    Musculoskeletal Musculoskelatal Symptoms Reviewed: Limited mobility Additional Musculoskeletal Details: walker as needed Musculoskeletal Management Strategies: Adequate rest, Medical device Musculoskeletal Self-Management Outcome: 4 (good) Musculoskeletal Comment: reviewed safety precautions      Psychosocial Psychosocial Symptoms Reported: No symptoms reported         There were no vitals filed for this visit.  Medications Reviewed Today     Reviewed by Aura Mliss LABOR, RN (Registered Nurse) on 05/22/24 at 1050  Med List Status: <None>   Medication Order Taking? Sig Documenting Provider Last Dose Status Informant  acetaminophen  (TYLENOL ) 325 MG tablet [id number]  Take 2 tablets (650 mg total) by mouth every 6 (six) hours as needed for mild pain or headache (or Fever >/= 101). Pearlean Manus, MD  Active Self, Pharmacy Records  allopurinol  (ZYLOPRIM ) 100 MG tablet [id number]  Take 1 tablet by mouth once daily Joesph Annabella HERO, FNP  Active Self, Pharmacy Records  apixaban  (ELIQUIS ) 5 MG TABS tablet [id number]  Take 1 tablet (5 mg total) by mouth 2 (two) times daily. Raenelle Donalda HERO, MD  Active   atorvastatin  (LIPITOR) 20 MG tablet [id number]  Take 1 tablet (20 mg total) by mouth daily. Joesph Annabella HERO, FNP  Active Self, Pharmacy Records  cetirizine  (ZYRTEC ) 10 MG tablet [id number]  Take 10 mg by mouth daily. [provider]  Active Self, Pharmacy Records  cyanocobalamin  (V-R VITAMIN B-12) 500 MCG  tablet [id number]  Take 1 tablet (500 mcg total) by mouth daily. Lamon Herter M, PA-C  Active   dapagliflozin  propanediol (FARXIGA ) 10 MG TABS tablet [id number]  Take 1 tablet (10 mg total) by mouth daily. Joesph Annabella HERO, FNP   Active Self, Pharmacy Records  ferrous sulfate  325 [phone] FE) MG EC tablet [id number]  Take 325 mg by mouth daily. [provider]  Active Self, Pharmacy Records  furosemide  (LASIX ) 20 MG tablet [id number]  Take 1 tablet (20 mg total) by mouth daily as needed for fluid or edema. Cheryle Page, MD  Active Self, Pharmacy Records  [facility] CRANBERRY EXTRACT PO [id number]  Take 1 tablet by mouth in the morning. [provider]  Active Self, Pharmacy Records  levothyroxine  (SYNTHROID ) 50 MCG tablet [id number]  Take 1 tablet (50 mcg total) by mouth daily before breakfast. Joesph Annabella HERO, FNP  Active Self, Pharmacy Records  metoprolol  succinate (TOPROL -XL) 200 MG 24 hr tablet [id number]  Take 1 tablet (200 mg total) by mouth daily. Ghimire, Donalda HERO, MD  Active   oxybutynin  (DITROPAN -XL) 10 MG 24 hr tablet [id number]  Take 1 tablet (10 mg total) by mouth at bedtime. Joesph Annabella HERO, FNP  Active Self, Pharmacy Records  pantoprazole  (PROTONIX ) 40 MG tablet [id number]  Take 1 tablet (40 mg total) by mouth 2 (two) times daily before a meal. Joesph Annabella HERO, FNP  Active Self, Pharmacy Records  polyethylene glycol powder (GLYCOLAX /MIRALAX ) 17 GM/SCOOP powder [id number]  Take 1 capful (17 g) by mouth daily as needed for moderate constipation. Cheryle Page, MD  Active Self, Pharmacy Records            Goals Addressed             This Visit's Progress    COMPLETED: VBCI Transitions of Care (TOC) Care Plan       Problems:  Recent Hospitalization for treatment of symptomatic anemia No Hospital Follow Up Provider appointment pt declines for RN CM to schedule post hospital follow up, pt has scheduled specialist appointments  Pt weight ranges 194-196 pounds, has been eating more, no new concerns reported, saw hematologist 05/15/24, had iron infusion 10/23, will have CBC checked q 2 weeks Adult son lives with pt  Goal:  Over the next 30 days, the patient will not experience hospital  readmission  Interventions:  Evaluation of current treatment plan related to symptomatic anemia,  self-management and patient's adherence to plan as established by provider. Discussed plans with patient for ongoing care management follow up and provided patient with direct contact information for care management team Reviewed all upcoming scheduled appointments Reviewed signs/ symptoms GI bleeding, action plan, importance of reporting immediately Reviewed HF action plan Reviewed plan of care with pt including TOC case closure, pt declines any further follow up  Patient Self Care Activities:  Attend all scheduled provider appointments Attend church or other social activities Call pharmacy for medication refills 3-7 days in advance of running out of medications Notify RN Care Manager of TOC call rescheduling needs Participate in Transition of Care Program/Attend TOC scheduled calls Perform all self care activities independently  Take medications as prescribed   Continue to weigh daily and keep a log  Plan:  No further follow up required: case closure The patient has been provided with contact information for the care management team and has been advised to call with any health related questions or concerns.         Recommendation:   PCP Follow-up  Follow  Up Plan:   Closing From:  Transitions of Care Program  Mliss Creed [facility], BSN RN Care Manager/ Transition of Care [place]/ [facility] [phone]

## 2024-05-22 NOTE — Patient Instructions (Signed)
 Visit Information  Thank you for taking time to visit with me today. Please don't hesitate to contact me if I can be of assistance to you before our next scheduled telephone appointment.  Our next appointment is no further scheduled appointments.    Following is a copy of your care plan:   Goals Addressed             This Visit's Progress    COMPLETED: VBCI Transitions of Care (TOC) Care Plan       Problems:  Recent Hospitalization for treatment of symptomatic anemia No Hospital Follow Up Provider appointment pt declines for RN CM to schedule post hospital follow up, pt has scheduled specialist appointments  Pt weight ranges 194-196 pounds, has been eating more, no new concerns reported, saw hematologist 05/15/24, had iron infusion 10/23, will have CBC checked q 2 weeks Adult son lives with pt  Goal:  Over the next 30 days, the patient will not experience hospital readmission  Interventions:  Evaluation of current treatment plan related to symptomatic anemia,  self-management and patient's adherence to plan as established by provider. Discussed plans with patient for ongoing care management follow up and provided patient with direct contact information for care management team Reviewed all upcoming scheduled appointments Reviewed signs/ symptoms GI bleeding, action plan, importance of reporting immediately Reviewed HF action plan Reviewed plan of care with pt including TOC case closure, pt declines any further follow up  Patient Self Care Activities:  Attend all scheduled provider appointments Attend church or other social activities Call pharmacy for medication refills 3-7 days in advance of running out of medications Notify RN Care Manager of TOC call rescheduling needs Participate in Transition of Care Program/Attend TOC scheduled calls Perform all self care activities independently  Take medications as prescribed   Continue to weigh daily and keep a log  Plan:  No  further follow up required: case closure The patient has been provided with contact information for the care management team and has been advised to call with any health related questions or concerns.         Care plan and visit instructions communicated with the patient verbally today. Patient agrees to receive a copy in MyChart. Active MyChart status and patient understanding of how to access instructions and care plan via MyChart confirmed with patient.     No further follow up required: case closure  Please call the care guide team at [phone] if you need to cancel or reschedule your appointment.   Please call the Suicide and Crisis Lifeline: 988 call the [place]  National Suicide Prevention Lifeline: 402-535-9699 or TTY: (647)415-3442 TTY 251-164-3534) to talk to a trained counselor call 1-800-273-TALK (toll free, 24 hour hotline) go to [facility] [address], [place] [phone]) call the [facility] Crisis Line: [phone] call 911 if you are experiencing a Mental Health or Behavioral Health Crisis or need someone to talk to.  Mliss Creed [facility], BSN RN Care Manager/ Transition of Care [place]/ [facility] [phone]

## 2024-05-23 ENCOUNTER — Ambulatory Visit: Admitting: Internal Medicine

## 2024-05-23 VITALS — BP 123/73 | HR 84 | Temp 98.4°F | Ht 63.0 in | Wt 199.0 lb

## 2024-05-23 DIAGNOSIS — Z7901 Long term (current) use of anticoagulants: Secondary | ICD-10-CM | POA: Diagnosis not present

## 2024-05-23 DIAGNOSIS — K31819 Angiodysplasia of stomach and duodenum without bleeding: Secondary | ICD-10-CM

## 2024-05-23 DIAGNOSIS — D500 Iron deficiency anemia secondary to blood loss (chronic): Secondary | ICD-10-CM | POA: Diagnosis not present

## 2024-05-23 NOTE — Progress Notes (Signed)
 Referring Provider: Joesph Annabella HERO, FNP Primary Care Physician:  Joesph Annabella HERO, FNP Primary GI:  Dr. Cindie  Chief Complaint  Patient presents with   Follow-up    Follow up from hospital---upper GI bleed    HPI:   Norva Bowe is a 83 y.o. female who presents to clinic today for hospital follow-up.  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  Was admitted to [facility] 04/20/2024 due to acute on chronic anemia.  EGD 04/21/2024 with normal-appearing esophagus, moderate GAVE with bleeding, nodular in nature.  Treated with APC.  Hemoglobin 8.0 on day of discharge.  Repeat blood work 05/06/2024 with hemoglobin 8.2, iron 111, ferritin 55.  Today, states she is doing well.  Denies any melena hematochezia.  Prior GI evaluation:  Colonoscopy 12/08/19: Polyps (tubular adenoma), diverticulosis, colonic lipoma EGD 12/08/19: Normal esophagus with 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 Given's Capsule 01/13/20: Scattered small bowel erosions, abnormal small bowel with edema, inflammation/erosion.  *CTA with SMA > 50% stenosis at origin, IMA and celiac patent.  Given's Capsule 05/04/21: ?questionable small bowel AVM per indication on EGD 05/18/21 , unable to locate givens report in Epic.  EGD 05/18/21:  Tiny hiatal hernia, gastric antral vascular ectasia s/p ablation, normal duodenum, normal jejunum. EGD 03/10/22: Small hiatal hernia, GAVE s/p APC, otherwise normal exam. EGD 02/01/2023 small hiatal hernia, GAVE status post APC, normal duodenum EGD 04/21/2024 normal esophagus, nodular GAVE oozing blood status post APC, duodenum.    Past Medical History:  Diagnosis Date   Anemia    Arthritis    Ostearthritis- hips, knees, fingers   Atrial fibrillation (HCC)    Basal cell carcinoma (BCC) of right hand 10/2021    Dx by [facility]   Blood transfusion without reported diagnosis 2021   Chronic kidney disease    DVT (deep venous thrombosis) (HCC)    Dyspnea    Dysrhythmia    GERD (gastroesophageal reflux disease)    Headache(784.0)    tx. Valproic  acid   History of diabetes mellitus    History of kidney stones    Hypertension    Hypothyroidism    Presence of Watchman left atrial appendage closure device 06/15/2023   24mm Watchman FLX Pro device placed by Dr. Cindie   Pulmonary embolism [facility])     Past Surgical History:  Procedure Laterality Date   ABDOMINAL HYSTERECTOMY     BILATERAL HIP ARTHROSCOPY Left    BIOPSY  12/08/2019   Procedure: BIOPSY;  Surgeon: Shaaron Lamar HERO, MD;  [facility];  Service: Endoscopy;;   CATARACT EXTRACTION, BILATERAL     COLONOSCOPY N/A 12/08/2019   polyps (tubular adenoma), diverticulosis, colonic lipoma, no surveillance due to age   CYSTOSCOPY W/ URETERAL STENT PLACEMENT Right 12/25/2020   Procedure: CYSTOSCOPY WITH RETROGRADE PYELOGRAM/URETERAL STENT PLACEMENT;  Surgeon: Selma Donnice SAUNDERS, MD;  Location: [facility];  Service: Urology;  Laterality: Right;   CYSTOSCOPY/URETEROSCOPY/HOLMIUM LASER/STENT PLACEMENT Right 01/15/2021   Procedure: CYSTOSCOPY/RETROGRADE/URETEROSCOPY/HOLMIUM LASER/STENT EXCHANGE;  Surgeon: Selma Donnice SAUNDERS, MD;  Location: [facility];  Service: Urology;  Laterality: Right;   ESOPHAGOGASTRODUODENOSCOPY N/A 12/08/2019   normal esophagus with 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   ESOPHAGOGASTRODUODENOSCOPY N/A 04/21/2024   Procedure: EGD (ESOPHAGOGASTRODUODENOSCOPY);  Surgeon: Albertus Gordy HERO, MD;  Location: [facility] ENDOSCOPY;  Service: Gastroenterology;  Laterality: N/A;   ESOPHAGOGASTRODUODENOSCOPY (  EGD) WITH PROPOFOL  N/A 05/18/2021   Surgeon: Rosalie Kitchens, MD;   Tiny hiatal hernia, gastric antral vascular ectasia s/p ablation, normal duodenum, normal jejunum.   ESOPHAGOGASTRODUODENOSCOPY (EGD) WITH PROPOFOL   N/A 03/10/2022   Procedure: ESOPHAGOGASTRODUODENOSCOPY (EGD) WITH PROPOFOL ;  Surgeon: Cindie Carlin POUR, DO;  [facility];  Service: Endoscopy;  Laterality: N/A;  10:45am   ESOPHAGOGASTRODUODENOSCOPY (EGD) WITH PROPOFOL  N/A 02/01/2023   Procedure: ESOPHAGOGASTRODUODENOSCOPY (EGD) WITH PROPOFOL ;  Surgeon: Cindie Carlin POUR, DO;  [facility];  Service: Endoscopy;  Laterality: N/A;  9:00 am, asa 3   EYE SURGERY  2015   Cataracts   GIVENS CAPSULE STUDY N/A 01/13/2020   Procedure: GIVENS CAPSULE STUDY;  Surgeon: Shaaron Lamar HERO, MD;  [facility];  Service: Endoscopy;  Laterality: N/A;  7:30am   GIVENS CAPSULE STUDY N/A 05/04/2021   Procedure: GIVENS CAPSULE STUDY;  Surgeon: Rosalie Kitchens, MD;  [facility];  Service: Endoscopy;  Laterality: N/A;   JOINT REPLACEMENT  2012 2015   Hips   LEFT ATRIAL APPENDAGE OCCLUSION N/A 06/15/2023   Procedure: LEFT ATRIAL APPENDAGE OCCLUSION;  Surgeon: Cindie Ole DASEN, MD;  [facility];  Service: Cardiovascular;  Laterality: N/A;   MULTIPLE TOOTH EXTRACTIONS     60's   PARATHYROIDECTOMY     POLYPECTOMY  12/08/2019   Procedure: POLYPECTOMY;  Surgeon: Shaaron Lamar HERO, MD;  [facility];  Service: Endoscopy;;   POLYPECTOMY  02/01/2023   Procedure: POLYPECTOMY;  Surgeon: Cindie Carlin POUR, DO;  [facility];  Service: Endoscopy;;   RADIOFREQUENCY ABLATION  05/18/2021   Procedure: RADIO FREQUENCY ABLATION;  Surgeon: Rosalie Kitchens, MD;  [facility];  Service: Endoscopy;;   TEE WITHOUT CARDIOVERSION N/A 07/06/2021   Procedure: TRANSESOPHAGEAL ECHOCARDIOGRAM (TEE);  Surgeon: Kate Lonni CROME, MD;  Location: [facility] ENDOSCOPY;  Service: Cardiovascular;  Laterality: N/A;   TEE WITHOUT CARDIOVERSION N/A 06/15/2023   Procedure: TRANSESOPHAGEAL ECHOCARDIOGRAM;  Surgeon: Cindie Ole DASEN, MD;  Location: [facility] INVASIVE CV LAB;  Service: Cardiovascular;  Laterality: N/A;   TOTAL HIP  ARTHROPLASTY Right 10/29/2013   Procedure: RIGHT TOTAL HIP ARTHROPLASTY ANTERIOR APPROACH;  Surgeon: Donnice JONETTA Car, MD;  Location: [facility];  Service: Orthopedics;  Laterality: Right;   TRANSESOPHAGEAL ECHOCARDIOGRAM (CATH LAB) N/A 08/18/2023   Procedure: TRANSESOPHAGEAL ECHOCARDIOGRAM;  Surgeon: Francyne Headland, MD;  [facility];  Service: Cardiovascular;  Laterality: N/A;   TUBAL LIGATION  1972    Current Outpatient Medications  Medication Sig Dispense Refill   acetaminophen  (TYLENOL ) 325 MG tablet Take 2 tablets (650 mg total) by mouth every 6 (six) hours as needed for mild pain or headache (or Fever >/= 101). 12 tablet 0   allopurinol  (ZYLOPRIM ) 100 MG tablet Take 1 tablet by mouth once daily 90 tablet 0   apixaban  (ELIQUIS ) 5 MG TABS tablet Take 1 tablet (5 mg total) by mouth 2 (two) times daily.     atorvastatin  (LIPITOR) 20 MG tablet Take 1 tablet (20 mg total) by mouth daily. 90 tablet 3   cetirizine  (ZYRTEC ) 10 MG tablet Take 10 mg by mouth daily.     cyanocobalamin  (V-R VITAMIN B-12) 500 MCG tablet Take 1 tablet (500 mcg total) by mouth daily. 90 tablet 3   dapagliflozin  propanediol (FARXIGA ) 10 MG TABS tablet Take 1 tablet (10 mg total) by mouth daily. 90 tablet 5   ferrous sulfate  325 (65 FE) MG EC tablet Take 325 mg by mouth daily.  furosemide  (LASIX ) 20 MG tablet Take 1 tablet (20 mg total) by mouth daily as needed for fluid or edema.     GNP CRANBERRY EXTRACT PO Take 1 tablet by mouth in the morning.     levothyroxine  (SYNTHROID ) 50 MCG tablet Take 1 tablet (50 mcg total) by mouth daily before breakfast. 90 tablet 2   metoprolol  succinate (TOPROL -XL) 200 MG 24 hr tablet Take 1 tablet (200 mg total) by mouth daily. 30 tablet 1   oxybutynin  (DITROPAN -XL) 10 MG 24 hr tablet Take 1 tablet (10 mg total) by mouth at bedtime. 90 tablet 0   pantoprazole  (PROTONIX ) 40 MG tablet Take 1 tablet (40 mg total) by mouth 2 (two) times daily before a meal. 180 tablet 0    polyethylene glycol powder (GLYCOLAX /MIRALAX ) 17 GM/SCOOP powder Take 1 capful (17 g) by mouth daily as needed for moderate constipation. 238 g 0   No current facility-administered medications for this visit.    Allergies as of 05/23/2024   (No Known Allergies)    Family History  Problem Relation Age of Onset   Colitis Sister 81       alive   Cancer Sister 74       colon   Cancer Mother 24       uterine   Heart disease Father 16       heart failure   Heart attack Brother 54   Healthy Daughter    Healthy Son    Pulmonary embolism Brother 46   Heart disease Brother    Healthy Son    Healthy Son     Social History   Socioeconomic History   Marital status: Widowed    Spouse name: Not on file   Number of children: 4   Years of education: 74   Highest education level: 12th grade  Occupational History   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 She has four adult children and one son lives with her.       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   Social Drivers of Corporate Investment Banker Strain: Low Risk  (01/23/2024)   Overall Financial Resource Strain (CARDIA)    Difficulty of Paying Living Expenses: Not very hard  Food Insecurity: No Food Insecurity (04/23/2024)   Hunger Vital Sign    Worried About Running Out of Food in the Last Year: Never true    Ran Out of Food in the Last Year: Never true  Transportation Needs: No Transportation Needs (04/23/2024)   PRAPARE - Administrator, Civil Service (Medical): No    Lack of Transportation (Non-Medical): No  Physical Activity: Insufficiently Active  (01/23/2024)   Exercise Vital Sign    Days of Exercise per Week: 1 day    Minutes of Exercise per Session: 10 min  Stress: No Stress Concern Present (11/23/2023)   [employer] of Occupational Health - Occupational Stress Questionnaire    Feeling of Stress : Not at all  Social Connections: Moderately Isolated (04/20/2024)   Social Connection and  Isolation Panel    Frequency of Communication with Friends and Family: Patient unable to answer    Frequency of Social Gatherings with Friends and Family: More than three times a week    Attends Religious Services: 1 to 4 times per year    Active Member of [employer] or Organizations: No    Attends Banker Meetings: Never    Marital Status: Widowed    Subjective: Review of Systems  Constitutional:  Negative for chills and fever.  HENT:  Negative for congestion and hearing loss.   Eyes:  Negative for blurred vision and double vision.  Respiratory:  Negative for cough and shortness of breath.   Cardiovascular:  Negative for chest pain and palpitations.  Gastrointestinal:  Negative for abdominal pain, blood in stool, constipation, diarrhea, heartburn, melena and vomiting.  Genitourinary:  Negative for dysuria and urgency.  Musculoskeletal:  Negative for joint pain and myalgias.  Skin:  Negative for itching and rash.  Neurological:  Negative for dizziness and headaches.  Psychiatric/Behavioral:  Negative for depression. The patient is not nervous/anxious.      Objective: BP 123/73   Pulse 84   Temp 98.4 F (36.9 C)   Ht 5' 3 (1.6 m)   Wt 199 lb (90.3 kg)   BMI 35.25 kg/m  Physical Exam Constitutional:      Appearance: Normal appearance.  HENT:     Head: Normocephalic and atraumatic.  Eyes:     Extraocular Movements: Extraocular movements intact.     Conjunctiva/sclera: Conjunctivae normal.  Cardiovascular:     Rate and Rhythm: Normal rate and regular rhythm.  Pulmonary:     Effort: Pulmonary effort is normal.      Breath sounds: Normal breath sounds.  Abdominal:     General: Bowel sounds are normal.     Palpations: Abdomen is soft.  Musculoskeletal:        General: No swelling. Normal range of motion.     Cervical back: Normal range of motion and neck supple.  Skin:    General: Skin is warm and dry.     Coloration: Skin is not jaundiced.  Neurological:     General: No focal deficit present.     Mental Status: She is alert and oriented to person, place, and time.  Psychiatric:        Mood and Affect: Mood normal.        Behavior: Behavior normal.      Assessment: *Gastric antral vascular ectasia *IDA due to chronic blood loss from above *Chronic anticoagulation with Eliquis   Plan: Discussed in depth with patient today.  Hemoglobin stable.  Getting regular checks every 2 weeks with hematology.  Appreciate their help.  Continue iron infusions as needed.  Continue twice daily PPI.  Discussed role of banding her GAVE given multiple APC treatments prior as well as nodular component of her disease.  She would like to hold off for now.  She will discuss further with her family, if she decides, she will call office and I will refer to Dr. Wilhelmenia.   Otherwise follow-up in 8 weeks.  05/23/2024 3:59 PM

## 2024-05-23 NOTE — Patient Instructions (Addendum)
 Your most recent blood counts are stable.  Continue close follow-up with hematology.  Continue on pantoprazole  twice daily.  If you decide you would like to meet with Dr. Wilhelmenia to discuss banding of your GAVE then let us  know and I can send in a referral.  Otherwise follow-up in 8 weeks.  It was very nice seeing you both today.  Dr. Cindie

## 2024-05-24 ENCOUNTER — Inpatient Hospital Stay

## 2024-05-24 ENCOUNTER — Ambulatory Visit: Admitting: Gastroenterology

## 2024-05-24 VITALS — BP 131/80 | HR 80 | Temp 98.0°F | Resp 18

## 2024-05-24 DIAGNOSIS — D500 Iron deficiency anemia secondary to blood loss (chronic): Secondary | ICD-10-CM

## 2024-05-24 DIAGNOSIS — D509 Iron deficiency anemia, unspecified: Secondary | ICD-10-CM | POA: Diagnosis not present

## 2024-05-24 MED ORDER — SODIUM CHLORIDE 0.9 % IV SOLN: INTRAVENOUS | Status: DC

## 2024-05-24 MED ORDER — SODIUM CHLORIDE 0.9 % IV SOLN
510.0000 mg | Freq: Once | INTRAVENOUS | Status: AC
  Administered 2024-05-24: 510 mg via INTRAVENOUS
  Filled 2024-05-24: qty 510

## 2024-05-24 NOTE — Progress Notes (Signed)
 Patient reports taking pre-meds at home. Patient tolerated iron infusion with no complaints voiced. Patient refused to wait 30 minute wait time. Peripheral IV site clean and dry with good blood return noted before and after infusion. Band aid applied. VSS with discharge and left in satisfactory condition with no s/s of distress noted.

## 2024-05-24 NOTE — Patient Instructions (Signed)

## 2024-05-27 ENCOUNTER — Inpatient Hospital Stay: Attending: Physician Assistant

## 2024-05-27 DIAGNOSIS — K922 Gastrointestinal hemorrhage, unspecified: Secondary | ICD-10-CM | POA: Diagnosis not present

## 2024-05-27 DIAGNOSIS — D500 Iron deficiency anemia secondary to blood loss (chronic): Secondary | ICD-10-CM

## 2024-05-27 DIAGNOSIS — Z86718 Personal history of other venous thrombosis and embolism: Secondary | ICD-10-CM | POA: Diagnosis not present

## 2024-05-27 DIAGNOSIS — Z86711 Personal history of pulmonary embolism: Secondary | ICD-10-CM | POA: Diagnosis not present

## 2024-05-27 LAB — SAMPLE TO BLOOD BANK

## 2024-05-27 LAB — CBC
HCT: 31.5 % — ABNORMAL LOW (ref 36.0–46.0)
Hemoglobin: 8.8 g/dL — ABNORMAL LOW (ref 12.0–15.0)
MCH: 28.7 pg (ref 26.0–34.0)
MCHC: 27.9 g/dL — ABNORMAL LOW (ref 30.0–36.0)
MCV: 102.6 fL — ABNORMAL HIGH (ref 80.0–100.0)
Platelets: 213 K/uL (ref 150–400)
RBC: 3.07 MIL/uL — ABNORMAL LOW (ref 3.87–5.11)
RDW: 18.9 % — ABNORMAL HIGH (ref 11.5–15.5)
WBC: 4.5 K/uL (ref 4.0–10.5)
nRBC: 0 % (ref 0.0–0.2)

## 2024-05-28 NOTE — Telephone Encounter (Signed)
 PAP: re-enrollment application for Farxiga  has been submitted to [employer] ([employer]), via fax

## 2024-05-30 NOTE — Telephone Encounter (Signed)
 Received a refill request fax from [employer] patient assistance company for patients FARXIGA  medication.   Please send a 90 day supply (with refills) to [facility] if applicable. Thanks!

## 2024-05-31 ENCOUNTER — Telehealth: Payer: Self-pay | Admitting: Pharmacist

## 2024-05-31 ENCOUNTER — Other Ambulatory Visit: Payer: Self-pay | Admitting: *Deleted

## 2024-05-31 DIAGNOSIS — N1831 Chronic kidney disease, stage 3a: Secondary | ICD-10-CM

## 2024-05-31 MED ORDER — DAPAGLIFLOZIN PROPANEDIOL 10 MG PO TABS
10.0000 mg | ORAL_TABLET | Freq: Every day | ORAL | 5 refills | Status: AC
Start: 2024-05-31 — End: ?

## 2024-05-31 NOTE — Telephone Encounter (Signed)
 Farxiga  orders sent to [facility] on behalf of the [employer] patient assistance program   Mliss Tarry [place], PharmD, BCACP, CPP Clinical Pharmacist, [facility]

## 2024-06-10 ENCOUNTER — Inpatient Hospital Stay

## 2024-06-10 DIAGNOSIS — D500 Iron deficiency anemia secondary to blood loss (chronic): Secondary | ICD-10-CM

## 2024-06-10 LAB — CBC
HCT: 30.1 % — ABNORMAL LOW (ref 36.0–46.0)
Hemoglobin: 8.8 g/dL — ABNORMAL LOW (ref 12.0–15.0)
MCH: 30.3 pg (ref 26.0–34.0)
MCHC: 29.2 g/dL — ABNORMAL LOW (ref 30.0–36.0)
MCV: 103.8 fL — ABNORMAL HIGH (ref 80.0–100.0)
Platelets: 202 K/uL (ref 150–400)
RBC: 2.9 MIL/uL — ABNORMAL LOW (ref 3.87–5.11)
RDW: 16.6 % — ABNORMAL HIGH (ref 11.5–15.5)
WBC: 4 K/uL (ref 4.0–10.5)
nRBC: 0 % (ref 0.0–0.2)

## 2024-06-10 LAB — SAMPLE TO BLOOD BANK

## 2024-06-11 NOTE — Progress Notes (Unsigned)
 Cardiology Office Note:   Date:  06/12/2024  ID:  [name] [name], [name] [name] January 27, 1941, MRN [id number] PCP: Joesph Annabella HERO, FNP  [facility] Providers Cardiologist:  Lynwood Schilling, MD Cardiology APP:  Jerilynn Lamarr HERO, NP {  History of Present Illness:   [name] [name] is a 83 y.o. female who presents for management of chronic systolic CHF and persistent atrial fibrillation with CHADS  VASC Score of 6.   Echocardiogram on 02/07/21 revealed 35% to 40%, moderate elevated PASP with estimated RVSP 51.5 mmHg, severely dilated LA, moderate dilated RA, severe MR moderate TR, mild aortic sclerosis.  She did have a repeat echocardiogram which was completed on 04/01/2021.  She was found to have a normal ejection fraction of 55 to 60% with normal LV function.  It was however found that she had severe mitral valve regurgitation with no evidence of mitral valve stenosis, moderate mitral annular calcification.  PI SA ER O0 0.44 cm, MR radius 1.1 cm and MR regurgitant volume of 92 cc.  She was recommended for TEE for further assessment of mitral regurgitation.   She had this in Dec 2022 and her MR was thought to be moderate and this was reviewed by the structural team and intervention was not suggested.  The most recent echocardiogram in September of last year demonstrated mild mitral regurgitation.   She was in the hospital in June 2024 with acute respiratory failure and PE.  I managed her at that time.  She had rapid A-fib related to some volume overload or respiratory failure and pulmonary emboli.  She was diuresed.  She had her rate controlled.  She was started on anticoagulation.   She was last in the hospital in Sept 2025.  She had severe symptomatic anemia in the setting of GAVE.  Eliquis  was held x 48 hours.    Since she was in the hospital she has done OK.  She lives with a son and gets around on her walker.  The patient denies any new symptoms such as chest discomfort, neck or arm  discomfort. There has been no new shortness of breath, PND or orthopnea. There have been no reported palpitations, presyncope or syncope.  She really notices her fibrillation.  She has had no bleeding and is tolerating her anticoagulation.  She is not experiencing any presyncope or syncope.  She has had no PND or orthopnea.  She is chronically slept in a recliner.   ROS: As stated in the HPI and negative for all other systems.  Studies Reviewed:    EKG:     NA    Risk Assessment/Calculations:    CHA2DS2-VASc Score = 4   This indicates a 4.8% annual risk of stroke. The patient's score is based upon: CHF History: 0 HTN History: 1 Diabetes History: 0 Stroke History: 0 Vascular Disease History: 0 Age Score: 2 Gender Score: 1       Physical Exam:   VS:  BP (!) 140/80   Pulse 80   Ht 5' 3 (1.6 m)   Wt 200 lb (90.7 kg)   BMI 35.43 kg/m    Wt Readings from Last 3 Encounters:  06/12/24 200 lb (90.7 kg)  05/23/24 199 lb (90.3 kg)  05/22/24 195 lb (88.5 kg)     GEN: Well nourished, well developed in no acute distress NECK: No JVD; No carotid bruits CARDIAC: Irregular RR, no murmurs, rubs, gallops RESPIRATORY:  Clear to auscultation without rales, wheezing or rhonchi  ABDOMEN: Soft, non-tender, non-distended  EXTREMITIES: Mild bilateral leg edema; No deformity   ASSESSMENT AND PLAN:   Persistent atrial fibrillation with RVR : The patient tolerates anticoagulation now.  She would be at high risk for DVT PE without this.  She does have a watchman.  No change in therapy.   Respiratory failure: She recovered from this.  No change in therapy.   Acute PE:   She has had recurrent PE she is on lifelong anticoagulation.    HFimpEF: EF 40 - 45%.   She seems to be euvolemic.  She has renal insufficiency precluding use of ARB and ARNI.  She has had lower blood pressures so I will not titrate meds further.  History of GI bleed/iron deficiency anemia: Hemoglobin was 8.8 and stable.  She  has gotten iron infusions.  She has been followed every 2 weeks.   Hypertension:   This is being managed in the context of treating his CHF  CKD stage III:   Creatinine was stable at 1.23.  Continue to avoid overdiuresis and nephrotoxins.   Edema:   The patient does have some lower extremity swelling.  I am gena manage this conservatively by telling her to keep her feet up and I have suggested compression stockings.   Follow up with me in 6 months  Signed, Lynwood Schilling, MD

## 2024-06-12 ENCOUNTER — Encounter: Payer: Self-pay | Admitting: Cardiology

## 2024-06-12 ENCOUNTER — Ambulatory Visit: Admitting: Cardiology

## 2024-06-12 ENCOUNTER — Ambulatory Visit (INDEPENDENT_AMBULATORY_CARE_PROVIDER_SITE_OTHER): Admitting: Cardiology

## 2024-06-12 VITALS — BP 140/80 | HR 80 | Ht 63.0 in | Wt 200.0 lb

## 2024-06-12 DIAGNOSIS — I4891 Unspecified atrial fibrillation: Secondary | ICD-10-CM | POA: Diagnosis not present

## 2024-06-12 DIAGNOSIS — I5022 Chronic systolic (congestive) heart failure: Secondary | ICD-10-CM

## 2024-06-12 DIAGNOSIS — I10 Essential (primary) hypertension: Secondary | ICD-10-CM | POA: Diagnosis not present

## 2024-06-12 DIAGNOSIS — N1832 Chronic kidney disease, stage 3b: Secondary | ICD-10-CM

## 2024-06-12 NOTE — Patient Instructions (Addendum)

## 2024-06-18 ENCOUNTER — Encounter: Payer: Self-pay | Admitting: Internal Medicine

## 2024-06-24 ENCOUNTER — Inpatient Hospital Stay: Attending: Physician Assistant

## 2024-06-24 DIAGNOSIS — E538 Deficiency of other specified B group vitamins: Secondary | ICD-10-CM

## 2024-06-24 DIAGNOSIS — Z7901 Long term (current) use of anticoagulants: Secondary | ICD-10-CM | POA: Diagnosis not present

## 2024-06-24 DIAGNOSIS — E039 Hypothyroidism, unspecified: Secondary | ICD-10-CM | POA: Diagnosis not present

## 2024-06-24 DIAGNOSIS — R079 Chest pain, unspecified: Secondary | ICD-10-CM | POA: Insufficient documentation

## 2024-06-24 DIAGNOSIS — N1832 Chronic kidney disease, stage 3b: Secondary | ICD-10-CM | POA: Insufficient documentation

## 2024-06-24 DIAGNOSIS — R5383 Other fatigue: Secondary | ICD-10-CM | POA: Diagnosis not present

## 2024-06-24 DIAGNOSIS — Z86718 Personal history of other venous thrombosis and embolism: Secondary | ICD-10-CM | POA: Diagnosis not present

## 2024-06-24 DIAGNOSIS — D500 Iron deficiency anemia secondary to blood loss (chronic): Secondary | ICD-10-CM | POA: Insufficient documentation

## 2024-06-24 DIAGNOSIS — Z79899 Other long term (current) drug therapy: Secondary | ICD-10-CM | POA: Insufficient documentation

## 2024-06-24 DIAGNOSIS — K5900 Constipation, unspecified: Secondary | ICD-10-CM | POA: Diagnosis not present

## 2024-06-24 DIAGNOSIS — I129 Hypertensive chronic kidney disease with stage 1 through stage 4 chronic kidney disease, or unspecified chronic kidney disease: Secondary | ICD-10-CM | POA: Diagnosis not present

## 2024-06-24 DIAGNOSIS — F5089 Other specified eating disorder: Secondary | ICD-10-CM | POA: Insufficient documentation

## 2024-06-24 DIAGNOSIS — Z86711 Personal history of pulmonary embolism: Secondary | ICD-10-CM | POA: Diagnosis not present

## 2024-06-24 DIAGNOSIS — Z800 Family history of malignant neoplasm of digestive organs: Secondary | ICD-10-CM | POA: Insufficient documentation

## 2024-06-24 DIAGNOSIS — Z79890 Hormone replacement therapy (postmenopausal): Secondary | ICD-10-CM | POA: Insufficient documentation

## 2024-06-24 DIAGNOSIS — Z85828 Personal history of other malignant neoplasm of skin: Secondary | ICD-10-CM | POA: Insufficient documentation

## 2024-06-24 DIAGNOSIS — E1122 Type 2 diabetes mellitus with diabetic chronic kidney disease: Secondary | ICD-10-CM | POA: Diagnosis not present

## 2024-06-24 DIAGNOSIS — M549 Dorsalgia, unspecified: Secondary | ICD-10-CM | POA: Diagnosis not present

## 2024-06-24 DIAGNOSIS — Z7984 Long term (current) use of oral hypoglycemic drugs: Secondary | ICD-10-CM | POA: Diagnosis not present

## 2024-06-24 DIAGNOSIS — Z8049 Family history of malignant neoplasm of other genital organs: Secondary | ICD-10-CM | POA: Diagnosis not present

## 2024-06-24 DIAGNOSIS — Z87442 Personal history of urinary calculi: Secondary | ICD-10-CM | POA: Insufficient documentation

## 2024-06-24 DIAGNOSIS — I4891 Unspecified atrial fibrillation: Secondary | ICD-10-CM | POA: Diagnosis not present

## 2024-06-24 DIAGNOSIS — Z8041 Family history of malignant neoplasm of ovary: Secondary | ICD-10-CM | POA: Insufficient documentation

## 2024-06-24 DIAGNOSIS — R519 Headache, unspecified: Secondary | ICD-10-CM | POA: Diagnosis not present

## 2024-06-24 LAB — VITAMIN B12: Vitamin B-12: 554 pg/mL (ref 180–914)

## 2024-06-24 LAB — COMPREHENSIVE METABOLIC PANEL WITH GFR
ALT: 9 U/L (ref 0–44)
AST: 12 U/L — ABNORMAL LOW (ref 15–41)
Albumin: 3.9 g/dL (ref 3.5–5.0)
Alkaline Phosphatase: 109 U/L (ref 38–126)
Anion gap: 8 (ref 5–15)
BUN: 24 mg/dL — ABNORMAL HIGH (ref 8–23)
CO2: 22 mmol/L (ref 22–32)
Calcium: 10.9 mg/dL — ABNORMAL HIGH (ref 8.9–10.3)
Chloride: 112 mmol/L — ABNORMAL HIGH (ref 98–111)
Creatinine, Ser: 1.12 mg/dL — ABNORMAL HIGH (ref 0.44–1.00)
GFR, Estimated: 49 mL/min — ABNORMAL LOW (ref >60)
Glucose, Bld: 127 mg/dL — ABNORMAL HIGH (ref 70–99)
Potassium: 4 mmol/L (ref 3.5–5.1)
Sodium: 142 mmol/L (ref 135–145)
Total Bilirubin: 0.2 mg/dL (ref 0.0–1.2)
Total Protein: 5.8 g/dL — ABNORMAL LOW (ref 6.5–8.1)

## 2024-06-24 LAB — CBC WITH DIFFERENTIAL/PLATELET
Abs Immature Granulocytes: 0.02 K/uL (ref 0.00–0.07)
Basophils Absolute: 0 K/uL (ref 0.0–0.1)
Basophils Relative: 1 %
Eosinophils Absolute: 0.1 K/uL (ref 0.0–0.5)
Eosinophils Relative: 3 %
HCT: 30.5 % — ABNORMAL LOW (ref 36.0–46.0)
Hemoglobin: 8.9 g/dL — ABNORMAL LOW (ref 12.0–15.0)
Immature Granulocytes: 0 %
Lymphocytes Relative: 17 %
Lymphs Abs: 0.8 K/uL (ref 0.7–4.0)
MCH: 29.6 pg (ref 26.0–34.0)
MCHC: 29.2 g/dL — ABNORMAL LOW (ref 30.0–36.0)
MCV: 101.3 fL — ABNORMAL HIGH (ref 80.0–100.0)
Monocytes Absolute: 0.6 K/uL (ref 0.1–1.0)
Monocytes Relative: 12 %
Neutro Abs: 3.2 K/uL (ref 1.7–7.7)
Neutrophils Relative %: 67 %
Platelets: 194 K/uL (ref 150–400)
RBC: 3.01 MIL/uL — ABNORMAL LOW (ref 3.87–5.11)
RDW: 15.1 % (ref 11.5–15.5)
WBC: 4.7 K/uL (ref 4.0–10.5)
nRBC: 0 % (ref 0.0–0.2)

## 2024-06-24 LAB — FERRITIN: Ferritin: 67 ng/mL (ref 11–307)

## 2024-06-24 LAB — IRON AND TIBC
Iron: 17 ug/dL — ABNORMAL LOW (ref 28–170)
Saturation Ratios: 5 % — ABNORMAL LOW (ref 10.4–31.8)
TIBC: 353 ug/dL (ref 250–450)
UIBC: 336 ug/dL

## 2024-06-24 LAB — SAMPLE TO BLOOD BANK

## 2024-06-28 ENCOUNTER — Other Ambulatory Visit: Payer: Self-pay | Admitting: Family Medicine

## 2024-06-28 DIAGNOSIS — K219 Gastro-esophageal reflux disease without esophagitis: Secondary | ICD-10-CM

## 2024-06-29 ENCOUNTER — Other Ambulatory Visit: Payer: Self-pay | Admitting: *Deleted

## 2024-06-29 DIAGNOSIS — N3281 Overactive bladder: Secondary | ICD-10-CM

## 2024-07-01 ENCOUNTER — Telehealth: Payer: Self-pay | Admitting: Family Medicine

## 2024-07-01 NOTE — Telephone Encounter (Signed)
  Prescription Request  07/01/2024  Is this a Controlled Substance medicine? no  Have you seen your PCP in the last 2 weeks? no  If YES, route message to pool  -  If NO, patient needs to be scheduled for appointment.  What is the name of the medication or equipment? metoprolol  succinate (TOPROL -XL) 200 MG 24 hr tablet   Have you contacted your pharmacy to request a refill? yes   Which pharmacy would you like this sent to? [facility] Pt was already taking this medication but when she was seen at [facility] the doctor there increased the dosage and she is about to run out    Patient notified that their request is being sent to the clinical staff for review and that they should receive a response within 2 business days.

## 2024-07-01 NOTE — Telephone Encounter (Signed)
 [name] [name] NTBS in Jan for 6 mos FU. RF sent to pharmacy

## 2024-07-01 NOTE — Telephone Encounter (Signed)
 Appt scheduled for 07/29/2024

## 2024-07-02 ENCOUNTER — Other Ambulatory Visit: Payer: Self-pay

## 2024-07-02 MED ORDER — METOPROLOL SUCCINATE ER 200 MG PO TB24: 200.0000 mg | ORAL_TABLET | Freq: Every day | ORAL | 1 refills | Status: DC

## 2024-07-02 NOTE — Telephone Encounter (Signed)
 Refill sent in

## 2024-07-08 NOTE — Progress Notes (Unsigned)
 [facility] [address] [place], [place] [address]   CLINIC:  Medical Oncology/Hematology  PCP:  Joesph Annabella HERO, FNP [address] [place] [address] [phone]   REASON FOR VISIT:  Follow-up for iron  deficiency anemia   PRIOR THERAPY: Blood transfusions and oral iron  tablets   CURRENT THERAPY: Intermittent IV iron    INTERVAL HISTORY:  [name] is seen today for follow-up of iron  deficiency anemia related to chronic GI blood loss.   She was last seen by Pleasant Barefoot, PA-C on 05/13/2024. Her most recent IV iron  was with Feraheme  x 2 in October 2025. She is accompanied today by her son, Glendia.  She has not had any hospitalizations since her severe ABLA in September 2025. At today's visit, she reports feeling poorly due to back pain and fatigue. She has not had any recurrent melena since hospitalization in September 2025.  No gross hematochezia.  REPORTS: Dyspnea on exertion, mild ice pica, chest pain with exertion (follows with cardiology) and pain between shoulder blades that was worse with severe anemia and exertion (follows with cardiology), occasional headaches.  DENIES: Lightheadedness, syncope   She is taking iron  tablet once daily with mild constipation. She remains on Eliquis  5 mg BID for history of recurrent unprovoked DVT/PE in 2020, with another episode of bilateral PE in June 2025.  She reports 40% energy and 75% appetite.   She reports that she is maintaining a stable weight.  ASSESSMENT & PLAN:  1.  Iron  deficiency anemia secondary to GI blood loss - Normocytic anemia secondary to chronic blood loss, may also have some anemia related to CKD stage IIIb - Hematology work-up otherwise showed negative SPEP.  Normal folate, B12, MMA, copper .  Reticulocytes appropriately elevated to compensate for acute blood loss, both normal LDH, haptoglobin, DAT.  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  She received IV iron  (Venofer  200 mg) while hospitalized, as well as 3 units PRBC.  EGD on 04/21/2024 showed moderate nodular GAVE with bleeding, treated with APC.   - She denies any recent melena or hematochezia - She is on Eliquis  5 mg twice daily (recurrent unprovoked DVT/PE).  She had stopped Eliquis  in January 2025 after Watchman device was placed.  However, she had recurrent unprovoked PE in June 2025 and has been back on Eliquis  since then, coinciding with significant worsening of her anemia. - She takes iron  tablet once daily + vitamin B12 500 mcg daily. - Most recent IV Feraheme  x 2 in October 2025 - Labs today (07/09/2024): Hgb 9.6/MCV 94.4, ferritin 25, iron  saturation 4%.  Vitamin B12 improved at 623.  Baseline CKD stage IIIa/b with creatinine 1.2/GFR 43. - PLAN: Suspect that she has ongoing slow GI blood loss.  - Recommend trial of  IV Feraheme  once a month x 3 months - Continue vitamin B12 500 mcg daily.  Continue iron  tablet daily. - We will watch closely with CBC + BB sample every 2 weeks. Transfuse if Hgb <8.0. - RTC for office visit and repeat CBC/D, CMP, B12, iron  panel in 3 months.  If she continues to have Hgb <10.0 despite adequate ferritin >100 and vitamin B12 >400, we will consider starting her on ESA (would give if  Hgb <10.0, caution due to history of VTE).  - Continue close GI follow-up.     2.   Recurrent unprovoked DVT / PE: - Initial unprovoked right leg DVT and PE in September 2020:  Doppler on 04/03/2019: DVT in the femoral vein and calf veins CT chest PE protocol (04/03/2019): Moderate size pulmonary emboli in the right lower lobe - After initial episode in 2020, she was placed on Eliquis  5 mg twice daily, which was also indicated due to atrial  (07/28/2023) and placed on Plavix . - She had recurrent bilateral PE in June 2025, restarted on Eliquis  at that time CTA chest (01/14/2024): Bilateral pulmonary emboli, moderate volume.  No evidence of right heart strain. - She remains on Eliquis  5 mg twice daily. - PLAN:  Continue indefinite anticoagulation due to high risk of recurrent VTE.  This does place her at increased risk of GI bleeding, which we will follow closely with labs and IV iron  as needed, in addition to close GI follow-up.  Extensive risk/benefit discussion has been completed with patient and family, and they are agreeable to continue anticoagulation.   3.  Hypercalcemia - Longstanding history of hypercalcemia noted in chart - Patient reports partial parathyroidectomy in 1999 - Parathyroid  hormone was last checked in 2023, which was elevated at 94 - Most recent was seen by endocrinology in January 2024, and was referred to thyroid  surgeon but this was canceled due to other medical events going on at the time - PLAN: Suspect hyperparathyroidism as cause of hypercalcemia.  Recommend that she discuss with PCP regarding whether or not to reestablish care with endocrinologist and/or head/neck surgeon.   4.  Social/family history: - She lives at home with her older son.  She takes care of the garden and does laundry and her ADLs and IADLs.  But lately her energy levels are low and she is not able to do them.  She was a retired tobacco former.  Never smoked. - Sister had anemia.  Mother had uterine cancer and sister had colon cancer.  PLAN SUMMARY: >> IV Feraheme  once a month (first dose this week if possible) >> CBC/D+ BB sample every 2 weeks (transfuse if Hgb <8.0) >> Labs in 3 months = CBC/D, ferritin, iron /TIBC, CMP, B12 >> OFFICE visit in 3 months - same day as labs     REVIEW OF SYSTEMS:  Review of Systems  Constitutional:  Positive for fatigue. Negative for appetite change, chills, diaphoresis, fever and unexpected weight  change.  HENT:   Negative for lump/mass, nosebleeds and sore throat.   Eyes:  Negative for eye problems.  Respiratory:  Positive for cough and shortness of breath. Negative for hemoptysis.   Cardiovascular:  Positive for chest pain and palpitations. Negative for leg swelling.  Gastrointestinal:  Negative for abdominal pain, blood in stool, constipation, diarrhea, nausea and vomiting.  Genitourinary:  Negative for hematuria.   Musculoskeletal:  Positive for back pain.  Skin: Negative.   Neurological:  Positive for headaches. Negative for dizziness and light-headedness.  Hematological:  Does not bruise/bleed easily.  Psychiatric/Behavioral:  Positive for sleep disturbance.      PHYSICAL EXAM:  ECOG PERFORMANCE STATUS: 2 - Symptomatic, <50% confined to bed  Vitals:   07/09/24 0927  BP: 125/87  Pulse: 91  Resp: 16  Temp: 97.9 F (36.6 C)  SpO2: 100%    Filed Weights   07/09/24 0927  Weight: 200 lb 9.9 oz (91 kg)   Physical Exam Constitutional:  Appearance: Normal appearance. She is obese.  Cardiovascular:     Rate and Rhythm: Rhythm irregular.     Heart sounds: Normal heart sounds.  Pulmonary:     Breath sounds: Normal breath sounds.  Neurological:     General: No focal deficit present.     Mental Status: Mental status is at baseline.  Psychiatric:        Behavior: Behavior normal. Behavior is cooperative.     PAST MEDICAL/SURGICAL HISTORY:  Past Medical History:  Diagnosis Date   Anemia    Arthritis    Ostearthritis- hips, knees, fingers   Atrial fibrillation (HCC)    Basal cell carcinoma (BCC) of right hand 10/2021   Dx by [facility]   Blood transfusion without reported diagnosis 2021   Chronic kidney disease    DVT (deep venous thrombosis) (HCC)    Dyspnea    Dysrhythmia    GERD (gastroesophageal reflux disease)    Headache(784.0)    tx. Valproic  acid   History of diabetes mellitus    History of kidney stones    Hypertension    Hypothyroidism     Presence of Watchman left atrial appendage closure device 06/15/2023   24mm Watchman FLX Pro device placed by Dr. Cindie   Pulmonary embolism [facility])    Past Surgical History:  Procedure Laterality Date   ABDOMINAL HYSTERECTOMY     BILATERAL HIP ARTHROSCOPY Left    BIOPSY  12/08/2019   Procedure: BIOPSY;  Surgeon: Shaaron Lamar HERO, MD;  [facility];  Service: Endoscopy;;   CATARACT EXTRACTION, BILATERAL     COLONOSCOPY N/A 12/08/2019   polyps (tubular adenoma), diverticulosis, colonic lipoma, no surveillance due to age   CYSTOSCOPY W/ URETERAL STENT PLACEMENT Right 12/25/2020   Procedure: CYSTOSCOPY WITH RETROGRADE PYELOGRAM/URETERAL STENT PLACEMENT;  Surgeon: Selma Donnice SAUNDERS, MD;  Location: [facility];  Service: Urology;  Laterality: Right;   CYSTOSCOPY/URETEROSCOPY/HOLMIUM LASER/STENT PLACEMENT Right 01/15/2021   Procedure: CYSTOSCOPY/RETROGRADE/URETEROSCOPY/HOLMIUM LASER/STENT EXCHANGE;  Surgeon: Selma Donnice SAUNDERS, MD;  [facility]: [facility];  Service: Urology;  Laterality: Right;   ESOPHAGOGASTRODUODENOSCOPY N/A 12/08/2019   normal esophagus with 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   ESOPHAGOGASTRODUODENOSCOPY N/A 04/21/2024   Procedure: EGD (ESOPHAGOGASTRODUODENOSCOPY);  Surgeon: Albertus Gordy HERO, MD;  Location: [facility] ENDOSCOPY;  Service: Gastroenterology;  Laterality: N/A;   ESOPHAGOGASTRODUODENOSCOPY (EGD) WITH PROPOFOL  N/A 05/18/2021   Surgeon: Rosalie Kitchens, MD;   Tiny hiatal hernia, gastric antral vascular ectasia s/p ablation, normal duodenum, normal jejunum.   ESOPHAGOGASTRODUODENOSCOPY (EGD) WITH PROPOFOL  N/A 03/10/2022   Procedure: ESOPHAGOGASTRODUODENOSCOPY (EGD) WITH PROPOFOL ;  Surgeon: Cindie Carlin POUR, DO;  [facility];  Service: Endoscopy;  Laterality: N/A;  10:45am   ESOPHAGOGASTRODUODENOSCOPY (EGD) WITH PROPOFOL  N/A 02/01/2023   Procedure: ESOPHAGOGASTRODUODENOSCOPY (EGD) WITH PROPOFOL ;  Surgeon: Cindie Carlin POUR, DO;  [facility];  Service: Endoscopy;  Laterality: N/A;  9:00 am, asa 3   EYE SURGERY  2015   Cataracts   GIVENS CAPSULE STUDY N/A 01/13/2020   Procedure: GIVENS CAPSULE STUDY;  Surgeon: Shaaron Lamar HERO, MD;  [facility];  Service: Endoscopy;  Laterality: N/A;  7:30am   GIVENS CAPSULE STUDY N/A 05/04/2021   Procedure: GIVENS CAPSULE STUDY;  Surgeon: Rosalie Kitchens, MD;  [facility];  Service: Endoscopy;  Laterality: N/A;   JOINT REPLACEMENT  2012 2015   Hips   LEFT ATRIAL APPENDAGE OCCLUSION N/A 06/15/2023   Procedure: LEFT ATRIAL APPENDAGE OCCLUSION;  Surgeon: Cindie Ole DASEN, MD;  [facility];  Service: Cardiovascular;  Laterality: N/A;   MULTIPLE TOOTH EXTRACTIONS     60's   PARATHYROIDECTOMY     POLYPECTOMY  12/08/2019   Procedure: POLYPECTOMY;  Surgeon: Shaaron Lamar HERO, MD;  [facility];  Service: Endoscopy;;   POLYPECTOMY  02/01/2023   Procedure: POLYPECTOMY;  Surgeon: Cindie Carlin POUR, DO;  [facility];  Service: Endoscopy;;   RADIOFREQUENCY ABLATION  05/18/2021   Procedure: RADIO FREQUENCY ABLATION;  Surgeon: Rosalie Kitchens, MD;  [facility];  Service: Endoscopy;;   TEE WITHOUT CARDIOVERSION N/A 07/06/2021   Procedure: TRANSESOPHAGEAL ECHOCARDIOGRAM (TEE);  Surgeon: Kate Lonni CROME, MD;  Location: [facility] ENDOSCOPY;  Service: Cardiovascular;  Laterality: N/A;   TEE WITHOUT CARDIOVERSION N/A 06/15/2023   Procedure: TRANSESOPHAGEAL ECHOCARDIOGRAM;  Surgeon: Cindie Ole DASEN, MD;  Location: [facility] INVASIVE CV LAB;  Service: Cardiovascular;  Laterality: N/A;   TOTAL HIP ARTHROPLASTY Right 10/29/2013   Procedure: RIGHT TOTAL HIP ARTHROPLASTY ANTERIOR APPROACH;  Surgeon: Donnice JONETTA Car, MD;  [facility]: [facility];  Service: Orthopedics;  Laterality: Right;   TRANSESOPHAGEAL ECHOCARDIOGRAM (CATH LAB) N/A 08/18/2023   Procedure: TRANSESOPHAGEAL ECHOCARDIOGRAM;  Surgeon: Francyne Headland, MD;  [facility];   Service: Cardiovascular;  Laterality: N/A;   TUBAL LIGATION  1972    SOCIAL HISTORY:  Social History   Socioeconomic History   Marital status: Widowed    Spouse name: Not on file   Number of children: 4   Years of education: 24   Highest education level: 12th grade  Occupational History   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 She has four adult children and one son lives with her.       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   Social Drivers of Health   Tobacco Use: Low Risk (06/12/2024)   Patient History    Smoking Tobacco Use: Never    Smokeless Tobacco Use: Never    Passive Exposure: Not on file  Financial Resource Strain: Low Risk (01/23/2024)   Overall Financial Resource Strain (CARDIA)    Difficulty of Paying Living Expenses: Not very hard  Food Insecurity: No Food Insecurity (04/23/2024)   Epic    Worried About Programme Researcher, Broadcasting/film/video in the Last Year: Never true    Ran Out of Food in the Last Year: Never true  Transportation Needs: No Transportation Needs (04/23/2024)   Epic    Lack of Transportation (Medical): No    Lack of Transportation (Non-Medical): No  Physical Activity: Insufficiently Active (01/23/2024)   Exercise Vital Sign    Days of Exercise per Week: 1 day    Minutes of Exercise per Session: 10 min  Stress: No Stress Concern Present (11/23/2023)   [employer] of Occupational Health - Occupational Stress Questionnaire    Feeling of Stress : Not at all  Social Connections: Moderately Isolated (04/20/2024)   Social Connection and Isolation Panel    Frequency of Communication with Friends and Family:  Patient unable to answer    Frequency  of Social Gatherings with Friends and Family: More than three times a week    Attends Religious Services: 1 to 4 times per year    Active Member of [employer] or Organizations: No    Attends Banker Meetings: Never    Marital Status: Widowed  Intimate Partner Violence: Not At Risk (04/23/2024)   Epic    Fear of Current or Ex-Partner: No    Emotionally Abused: No    Physically Abused: No    Sexually Abused: No  Depression (PHQ2-9): Low Risk (07/09/2024)   Depression (PHQ2-9)    PHQ-2 Score: 0  Alcohol  Screen: Low Risk (12/27/2022)   Alcohol  Screen    Last Alcohol  Screening Score (AUDIT): 0  Housing: Unknown (04/23/2024)   Epic    Unable to Pay for Housing in the Last Year: No    Number of Times Moved in the Last Year: Not on file    Homeless in the Last Year: No  Utilities: Not At Risk (04/23/2024)   Epic    Threatened with loss of utilities: No  Health Literacy: Adequate Health Literacy (12/28/2023)   B1300 Health Literacy    Frequency of need for help with medical instructions: Never    FAMILY HISTORY:  Family History  Problem Relation Age of Onset   Colitis Sister 64       alive   Cancer Sister 66       colon   Cancer Mother 33       uterine   Heart disease Father 58       heart failure   Heart attack Brother 74   Healthy Daughter    Healthy Son    Pulmonary embolism Brother 46   Heart disease Brother    Healthy Son    Healthy Son     CURRENT MEDICATIONS:  Outpatient Encounter Medications as of 07/09/2024  Medication Sig   acetaminophen  (TYLENOL ) 325 MG tablet Take 2 tablets (650 mg total) by mouth every 6 (six) hours as needed for mild pain or headache (or Fever >/= 101).   allopurinol  (ZYLOPRIM ) 100 MG tablet Take 1 tablet by mouth once daily   apixaban  (ELIQUIS ) 5 MG TABS tablet Take 1 tablet by mouth twice daily   atorvastatin  (LIPITOR) 20 MG tablet Take 1 tablet (20 mg total) by mouth daily.   cetirizine  (ZYRTEC )  10 MG tablet Take 10 mg by mouth daily.   cyanocobalamin  (V-R VITAMIN B-12) 500 MCG tablet Take 1 tablet (500 mcg total) by mouth daily.   dapagliflozin  propanediol (FARXIGA ) 10 MG TABS tablet Take 1 tablet (10 mg total) by mouth daily.   ferrous sulfate  325 (65 FE) MG EC tablet Take 325 mg by mouth daily.   furosemide  (LASIX ) 20 MG tablet Take 1 tablet (20 mg total) by mouth daily as needed for fluid or edema.   GNP CRANBERRY EXTRACT PO Take 1 tablet by mouth in the morning.   levothyroxine  (SYNTHROID ) 50 MCG tablet Take 1 tablet (50 mcg total) by mouth daily before breakfast.   metoprolol  (TOPROL -XL) 200 MG 24 hr tablet Take 1 tablet (200 mg total) by mouth daily.   oxybutynin  (DITROPAN -XL) 10 MG 24 hr tablet TAKE 1 TABLET BY MOUTH AT BEDTIME   pantoprazole  (PROTONIX ) 40 MG tablet TAKE 1 TABLET BY MOUTH TWICE DAILY BEFORE A MEAL   polyethylene glycol powder (GLYCOLAX /MIRALAX ) 17 GM/SCOOP powder Take 1 capful (17 g) by mouth daily as needed for moderate constipation.   No facility-administered encounter medications on  file as of 07/09/2024.    ALLERGIES:  No Known Allergies  LABORATORY DATA:  I have reviewed the labs as listed.  CBC    Component Value Date/Time   WBC 4.4 07/09/2024 0841   RBC 3.41 (L) 07/09/2024 0841   HGB 9.6 (L) 07/09/2024 0841   HGB 4.6 (LL) 04/19/2024 1632   HCT 32.2 (L) 07/09/2024 0841   HCT 17.7 (LL) 04/19/2024 1632   PLT 213 07/09/2024 0841   PLT 332 04/19/2024 1632   MCV 94.4 07/09/2024 0841   MCV 84 04/19/2024 1632   MCH 28.2 07/09/2024 0841   MCHC 29.8 (L) 07/09/2024 0841   RDW 14.1 07/09/2024 0841   RDW 17.9 (H) 04/19/2024 1632   LYMPHSABS 0.9 07/09/2024 0841   LYMPHSABS 1.3 01/24/2024 1218   MONOABS 0.7 07/09/2024 0841   EOSABS 0.1 07/09/2024 0841   EOSABS 0.1 01/24/2024 1218   BASOSABS 0.0 07/09/2024 0841   BASOSABS 0.0 01/24/2024 1218      Latest Ref Rng & Units 07/09/2024    8:41 AM 06/24/2024    9:30 AM 05/06/2024   10:49 AM  CMP   Glucose 70 - 99 mg/dL 886  872  898   BUN 8 - 23 mg/dL 27  24  30    Creatinine 0.44 - 1.00 mg/dL 8.75  8.87  8.76   Sodium 135 - 145 mmol/L 141  142  140   Potassium 3.5 - 5.1 mmol/L 4.1  4.0  4.7   Chloride 98 - 111 mmol/L 107  112  107   CO2 22 - 32 mmol/L 28  22  25    Calcium  8.9 - 10.3 mg/dL 88.7  89.0  88.7   Total Protein 6.5 - 8.1 g/dL 6.0  5.8  5.7   Total Bilirubin 0.0 - 1.2 mg/dL 0.3  0.2  0.3   Alkaline Phos 38 - 126 U/L 107  109  94   AST 15 - 41 U/L 11  12  12    ALT 0 - 44 U/L 6  9  9      DIAGNOSTIC IMAGING:  I have independently reviewed the relevant imaging and discussed with the patient.   WRAP UP:  All questions were answered. The patient knows to call the clinic with any problems, questions or concerns.  Medical decision making: Moderate  Time spent on visit: I spent 20 minutes counseling the patient face to face. The total time spent in the appointment was 30 minutes and more than 50% was on counseling.  Pleasant CHRISTELLA Barefoot, PA-C  07/09/2024 12:04 PM

## 2024-07-09 ENCOUNTER — Inpatient Hospital Stay: Admitting: Physician Assistant

## 2024-07-09 ENCOUNTER — Other Ambulatory Visit: Payer: Self-pay

## 2024-07-09 ENCOUNTER — Inpatient Hospital Stay

## 2024-07-09 DIAGNOSIS — D500 Iron deficiency anemia secondary to blood loss (chronic): Secondary | ICD-10-CM

## 2024-07-09 LAB — VITAMIN B12: Vitamin B-12: 623 pg/mL (ref 180–914)

## 2024-07-09 LAB — CBC WITH DIFFERENTIAL/PLATELET
Abs Immature Granulocytes: 0.01 K/uL (ref 0.00–0.07)
Basophils Absolute: 0 K/uL (ref 0.0–0.1)
Basophils Relative: 1 %
Eosinophils Absolute: 0.1 K/uL (ref 0.0–0.5)
Eosinophils Relative: 3 %
HCT: 32.2 % — ABNORMAL LOW (ref 36.0–46.0)
Hemoglobin: 9.6 g/dL — ABNORMAL LOW (ref 12.0–15.0)
Immature Granulocytes: 0 %
Lymphocytes Relative: 19 %
Lymphs Abs: 0.9 K/uL (ref 0.7–4.0)
MCH: 28.2 pg (ref 26.0–34.0)
MCHC: 29.8 g/dL — ABNORMAL LOW (ref 30.0–36.0)
MCV: 94.4 fL (ref 80.0–100.0)
Monocytes Absolute: 0.7 K/uL (ref 0.1–1.0)
Monocytes Relative: 15 %
Neutro Abs: 2.8 K/uL (ref 1.7–7.7)
Neutrophils Relative %: 62 %
Platelets: 213 K/uL (ref 150–400)
RBC: 3.41 MIL/uL — ABNORMAL LOW (ref 3.87–5.11)
RDW: 14.1 % (ref 11.5–15.5)
WBC: 4.4 K/uL (ref 4.0–10.5)
nRBC: 0 % (ref 0.0–0.2)

## 2024-07-09 LAB — COMPREHENSIVE METABOLIC PANEL WITH GFR
ALT: 6 U/L (ref 0–44)
AST: 11 U/L — ABNORMAL LOW (ref 15–41)
Albumin: 4.2 g/dL (ref 3.5–5.0)
Alkaline Phosphatase: 107 U/L (ref 38–126)
Anion gap: 6 (ref 5–15)
BUN: 27 mg/dL — ABNORMAL HIGH (ref 8–23)
CO2: 28 mmol/L (ref 22–32)
Calcium: 11.2 mg/dL — ABNORMAL HIGH (ref 8.9–10.3)
Chloride: 107 mmol/L (ref 98–111)
Creatinine, Ser: 1.24 mg/dL — ABNORMAL HIGH (ref 0.44–1.00)
GFR, Estimated: 43 mL/min — ABNORMAL LOW (ref >60)
Glucose, Bld: 113 mg/dL — ABNORMAL HIGH (ref 70–99)
Potassium: 4.1 mmol/L (ref 3.5–5.1)
Sodium: 141 mmol/L (ref 135–145)
Total Bilirubin: 0.3 mg/dL (ref 0.0–1.2)
Total Protein: 6 g/dL — ABNORMAL LOW (ref 6.5–8.1)

## 2024-07-09 LAB — IRON AND TIBC
Iron: 14 ug/dL — ABNORMAL LOW (ref 28–170)
Saturation Ratios: 4 % — ABNORMAL LOW (ref 10.4–31.8)
TIBC: 363 ug/dL (ref 250–450)
UIBC: 349 ug/dL

## 2024-07-09 LAB — FERRITIN: Ferritin: 25 ng/mL (ref 11–307)

## 2024-07-09 LAB — SAMPLE TO BLOOD BANK

## 2024-07-09 NOTE — Patient Instructions (Signed)
 [facility] at [facility] **VISIT SUMMARY & IMPORTANT INSTRUCTIONS **   You were seen today by Pleasant Barefoot PA-C for your follow-up visit.    ANEMIA: The majority of your anemia is likely due to chronic blood loss from your stomach, which causes iron  deficiency.  You may also have some aspects of anemia from chronic kidney disease, as illustrated below.  We will schedule you for IV Feraheme  once a month, due to your suspected ongoing blood loss. Continue to take iron  tablet daily. Continue taking vitamin B12 500 mcg daily. Due to your high risk for ongoing bleeding, we will check your blood count (CBC every 2 weeks).  You will receive blood transfusion if your hemoglobin is <8.0. We will see you back for follow-up in about 2 months.  If you continue to have hemoglobin <10 despite adequate iron  and B12, we would consider starting you on injections to help treat your anemia related to chronic kidney disease.  HISTORY OF BLOOD CLOTS: Due to your recurrent pulmonary embolisms, you are at very high risk of having life-threatening blood clots in the future, unless you remain on blood thinners.  Therefore, we recommend lifelong blood thinners, although this does increase your risk of bleeding from your stomach and intestines.  OTHER CONCERNS Please speak with your cardiologist regarding the pain between your shoulder blades that is worse when your blood count is low or when you exert yourself.  This could be an atypical presentation of heart pain. Seek immediate medical attention if you have any black tarry bowel movements, bright red blood in your stool, severe shortness of breath with exertion, or chest pain. Speak to your primary care provider about your elevated calcium  levels and overactive parathyroid  gland.  They may need to refer you back to endocrinology.  FOLLOW-UP APPOINTMENT: 3 months  ** Thank you for trusting me with your healthcare!  I strive to provide all  of my patients with quality care at each visit.  If you receive a survey for this visit, I would be so grateful to you for taking the time to provide feedback.  Thank you in advance!  ~ Krislynn Gronau                                        Dr. Mickiel Davonna Pleasant Barefoot, PA-C          Delon Hope, NP   - - - - - - - - - - - - - - - - - -    Thank you for choosing [facility] at [facility] to provide your oncology and hematology care.  To afford each patient quality time with our provider, please arrive at least 15 minutes before your scheduled appointment time.   If you have a lab appointment with the Cancer Center please come in thru the Main Entrance and check in at the main information desk.  You need to re-schedule your appointment should you arrive 10 or more minutes late.  We strive to give you quality time with our providers, and arriving late affects you and other patients whose appointments are after yours.  Also, if you no show three or more times for appointments you may be dismissed from the clinic at the providers discretion.     Again, thank you for choosing [facility].  Our hope is that these requests will decrease the amount of time that you wait before being seen by our physicians.       _____________________________________________________________  Should you have questions after your visit to [facility], please contact our office at [phone] and follow the prompts.  Our office hours are 8:00 a.m. and 4:30 p.m. Monday - Friday.  Please note that voicemails left after 4:00 p.m. may not be returned until the following business day.  We are closed weekends and major holidays.  You do have access to a nurse 24-7, just call the main number to the clinic [phone] and do not press any options, hold on the line and a nurse will answer the phone.    For prescription refill requests, have your pharmacy contact our office  and allow 72 hours.

## 2024-07-10 LAB — LAB REPORT - SCANNED
Albumin, Urine POC: 145.8
Creatinine, POC: 114.3 mg/dL
Microalb Creat Ratio: 128

## 2024-07-12 ENCOUNTER — Inpatient Hospital Stay

## 2024-07-12 VITALS — BP 130/91 | HR 82 | Temp 97.2°F | Resp 18

## 2024-07-12 DIAGNOSIS — D500 Iron deficiency anemia secondary to blood loss (chronic): Secondary | ICD-10-CM | POA: Diagnosis not present

## 2024-07-12 MED ORDER — SODIUM CHLORIDE 0.9 % IV SOLN
510.0000 mg | Freq: Once | INTRAVENOUS | Status: AC
  Administered 2024-07-12: 510 mg via INTRAVENOUS
  Filled 2024-07-12: qty 510

## 2024-07-12 MED ORDER — SODIUM CHLORIDE 0.9 % IV SOLN: INTRAVENOUS | Status: DC

## 2024-07-12 NOTE — Patient Instructions (Signed)
 [facility] [place] - A DEPT OF [facility]  Discharge Instructions: Thank you for choosing [facility] to provide your oncology and hematology care.  If you have a lab appointment with the Cancer Center - please note that after April 8th, 2024, all labs will be drawn in the cancer center.  You do not have to check in or register with the main entrance as you have in the past but will complete your check-in in the cancer center.  Wear comfortable clothing and clothing appropriate for easy access to any Portacath or PICC line.   We strive to give you quality time with your provider. You may need to reschedule your appointment if you arrive late (15 or more minutes).  Arriving late affects you and other patients whose appointments are after yours.  Also, if you miss three or more appointments without notifying the office, you may be dismissed from the clinic at the provider's discretion.      For prescription refill requests, have your pharmacy contact our office and allow 72 hours for refills to be completed.    Today you received the following chemotherapy and/or immunotherapy agents feraheme      To help prevent nausea and vomiting after your treatment, we encourage you to take your nausea medication as directed.  BELOW ARE SYMPTOMS THAT SHOULD BE REPORTED IMMEDIATELY: *FEVER GREATER THAN 100.4 F (38 C) OR HIGHER *CHILLS OR SWEATING *NAUSEA AND VOMITING THAT IS NOT CONTROLLED WITH YOUR NAUSEA MEDICATION *UNUSUAL SHORTNESS OF BREATH *UNUSUAL BRUISING OR BLEEDING *URINARY PROBLEMS (pain or burning when urinating, or frequent urination) *BOWEL PROBLEMS (unusual diarrhea, constipation, pain near the anus) TENDERNESS IN MOUTH AND THROAT WITH OR WITHOUT PRESENCE OF ULCERS (sore throat, sores in mouth, or a toothache) UNUSUAL RASH, SWELLING OR PAIN  UNUSUAL VAGINAL DISCHARGE OR ITCHING   Items with * indicate a potential emergency and should be followed up  as soon as possible or go to the Emergency Department if any problems should occur.  Please show the CHEMOTHERAPY ALERT CARD or IMMUNOTHERAPY ALERT CARD at check-in to the Emergency Department and triage nurse.  Should you have questions after your visit or need to cancel or reschedule your appointment, please contact [facility]  - A DEPT OF [facility] [phone]  and follow the prompts.  Office hours are 8:00 a.m. to 4:30 p.m. Monday - Friday. Please note that voicemails left after 4:00 p.m. may not be returned until the following business day.  We are closed weekends and major holidays. You have access to a nurse at all times for urgent questions. Please call the main number to the clinic [phone] and follow the prompts.  For any non-urgent questions, you may also contact your provider using MyChart. We now offer e-Visits for anyone 52 and older to request care online for non-urgent symptoms. For details visit [email or web address].   Also download the MyChart app! Go to the app store, search "MyChart", open the app, select [place], and log in with your MyChart username and password.

## 2024-07-12 NOTE — Progress Notes (Signed)
 Patient presents today for  Feraheme  infusion per providers order.  Vital signs WNL.  Patient has no new complaints at this time.  Premedications were taken at home per patient.  Peripheral IV started and blood return noted pre and post infusion.  Patient refusing her post infusion wait time stating she never waits Patient understands the reason for the wait time and still refuses to wait.  Stable during infusion without adverse affects.  Vital signs stable.  No complaints at this time.  Discharge from clinic ambulatory in stable condition.  Alert and oriented X 3.  Follow up with [facility] as scheduled.

## 2024-07-22 ENCOUNTER — Encounter: Payer: Self-pay | Admitting: *Deleted

## 2024-07-22 ENCOUNTER — Other Ambulatory Visit: Payer: Self-pay | Admitting: Medical Genetics

## 2024-07-23 ENCOUNTER — Inpatient Hospital Stay

## 2024-07-23 DIAGNOSIS — D500 Iron deficiency anemia secondary to blood loss (chronic): Secondary | ICD-10-CM

## 2024-07-23 LAB — CBC
HCT: 34.5 % — ABNORMAL LOW (ref 36.0–46.0)
Hemoglobin: 9.9 g/dL — ABNORMAL LOW (ref 12.0–15.0)
MCH: 28 pg (ref 26.0–34.0)
MCHC: 28.7 g/dL — ABNORMAL LOW (ref 30.0–36.0)
MCV: 97.7 fL (ref 80.0–100.0)
Platelets: 197 K/uL (ref 150–400)
RBC: 3.53 MIL/uL — ABNORMAL LOW (ref 3.87–5.11)
RDW: 18.6 % — ABNORMAL HIGH (ref 11.5–15.5)
WBC: 5.6 K/uL (ref 4.0–10.5)
nRBC: 0 % (ref 0.0–0.2)

## 2024-07-23 LAB — SAMPLE TO BLOOD BANK

## 2024-07-29 ENCOUNTER — Ambulatory Visit

## 2024-07-29 ENCOUNTER — Encounter: Payer: Self-pay | Admitting: Family Medicine

## 2024-07-29 ENCOUNTER — Ambulatory Visit (INDEPENDENT_AMBULATORY_CARE_PROVIDER_SITE_OTHER): Admitting: Family Medicine

## 2024-07-29 ENCOUNTER — Other Ambulatory Visit: Payer: Self-pay | Admitting: Family Medicine

## 2024-07-29 VITALS — BP 117/70 | HR 94 | Temp 97.5°F | Ht 63.0 in | Wt 205.8 lb

## 2024-07-29 DIAGNOSIS — E1169 Type 2 diabetes mellitus with other specified complication: Secondary | ICD-10-CM

## 2024-07-29 DIAGNOSIS — Z13820 Encounter for screening for osteoporosis: Secondary | ICD-10-CM | POA: Diagnosis not present

## 2024-07-29 DIAGNOSIS — E1159 Type 2 diabetes mellitus with other circulatory complications: Secondary | ICD-10-CM

## 2024-07-29 DIAGNOSIS — E1122 Type 2 diabetes mellitus with diabetic chronic kidney disease: Secondary | ICD-10-CM

## 2024-07-29 DIAGNOSIS — D500 Iron deficiency anemia secondary to blood loss (chronic): Secondary | ICD-10-CM | POA: Diagnosis not present

## 2024-07-29 DIAGNOSIS — I4891 Unspecified atrial fibrillation: Secondary | ICD-10-CM

## 2024-07-29 DIAGNOSIS — Z780 Asymptomatic menopausal state: Secondary | ICD-10-CM | POA: Diagnosis not present

## 2024-07-29 DIAGNOSIS — I5020 Unspecified systolic (congestive) heart failure: Secondary | ICD-10-CM | POA: Diagnosis not present

## 2024-07-29 DIAGNOSIS — R052 Subacute cough: Secondary | ICD-10-CM | POA: Diagnosis not present

## 2024-07-29 DIAGNOSIS — E039 Hypothyroidism, unspecified: Secondary | ICD-10-CM

## 2024-07-29 DIAGNOSIS — R0789 Other chest pain: Secondary | ICD-10-CM

## 2024-07-29 DIAGNOSIS — Z23 Encounter for immunization: Secondary | ICD-10-CM

## 2024-07-29 DIAGNOSIS — I152 Hypertension secondary to endocrine disorders: Secondary | ICD-10-CM

## 2024-07-29 DIAGNOSIS — N1832 Chronic kidney disease, stage 3b: Secondary | ICD-10-CM | POA: Diagnosis not present

## 2024-07-29 LAB — TSH: TSH: 3.79 u[IU]/mL (ref 0.450–4.500)

## 2024-07-29 LAB — BAYER DCA HB A1C WAIVED: HB A1C (BAYER DCA - WAIVED): 4.4 % — ABNORMAL LOW (ref 4.8–5.6)

## 2024-07-29 NOTE — Progress Notes (Signed)
 "  Established Patient Office Visit  Subjective   Patient ID: [name] [name], female    DOB: 26-Aug-1940  Age: 84 y.o. MRN: [id number]  Chief Complaint  Patient presents with   Medical Management of Chronic Issues    HPI  History of Present Illness   [name] [name] is an 84 year old female with anemia and atrial fibrillation who presents with shortness of breath and persistent cough.  Dyspnea and peripheral edema - Shortness of breath occurs with exertion, absent at rest. - No chest pain or palpitations. - Heart rate fluctuates, sometimes elevated, occasionally drops to the seventies. - Swelling present in feet and ankles at baseline - No shortness of breath at rest.  Cough and respiratory symptoms - Persistent deep cough for approximately two months, non-productive. - Soreness along ribs with twisting movements. - No nasal congestion, sinus pressure, sore throat, or ear fullness. - Over-the-counter cough syrup provides partial relief. - Cough has been slowly improving  Anemia management - Receiving monthly iron  infusions since September. - Hemoglobin increased from 7.9 to 9.9. - Blood tests performed every other week for monitoring by hematology  Allergic symptoms - Continues Zyrtec  for allergies, initiated a few months ago for itchy, swollen eyes.  Hypothyroidism - No recent changes in appetite, hair, skin, nails, weight, sleep, mood, except for increased appetite with iron  and blood intake.   Immunization status - Received influenza vaccination today, previously missed during hospital stay in September.        ROS As per HPI.   Objective:     BP 117/70 Comment: at home reading per pt  Pulse 94 Comment: at home reading per pt  Temp (!) 97.5 F (36.4 C) (Temporal)   Ht 5' 3 (1.6 m)   Wt 205 lb 12.8 oz (93.4 kg)   SpO2 98%   BMI 36.46 kg/m    Physical Exam Vitals and nursing note reviewed.  Constitutional:      General: She is not in acute  distress.    Appearance: She is obese. She is not ill-appearing, toxic-appearing or diaphoretic.  HENT:     Right Ear: Tympanic membrane, ear canal and external ear normal.     Left Ear: Tympanic membrane, ear canal and external ear normal.     Nose: Nose normal.     Mouth/Throat:     Mouth: Mucous membranes are moist.     Pharynx: Oropharynx is clear.  Eyes:     General:        Right eye: No discharge.        Left eye: No discharge.     Conjunctiva/sclera: Conjunctivae normal.  Neck:     Vascular: No carotid bruit.  Cardiovascular:     Rate and Rhythm: Normal rate. Rhythm irregularly irregular.     Heart sounds: Normal heart sounds. No murmur heard. Pulmonary:     Effort: Pulmonary effort is normal. No respiratory distress.     Breath sounds: Normal breath sounds. No wheezing or rhonchi.  Musculoskeletal:     Cervical back: Neck supple. No rigidity.     Right lower leg: No edema.     Left lower leg: No edema.  Skin:    General: Skin is warm and dry.  Neurological:     General: No focal deficit present.     Mental Status: She is alert and oriented to person, place, and time.     Gait: Gait abnormal (using rolling walker).  Psychiatric:  Mood and Affect: Mood normal.        Behavior: Behavior normal.        Thought Content: Thought content normal.        Judgment: Judgment normal.      No results found for any visits on 07/29/24.    The ASCVD Risk score (Arnett DK, et al., 2019) failed to calculate for the following reasons:   The 2019 ASCVD risk score is only valid for ages 72 to 67   * - Cholesterol units were assumed    Assessment & Plan:   [name] [name] was seen today for medical management of chronic issues.  Diagnoses and all orders for this visit:  Type 2 diabetes mellitus with stage 3b chronic kidney disease, without long-term current use of insulin  (HCC) -     Bayer DCA Hb A1c Waived  Hypertension associated with type 2 diabetes mellitus  (HCC)  Hyperlipidemia associated with type 2 diabetes mellitus (HCC)  Stage 3b chronic kidney disease (HCC)  Morbid obesity (HCC)  Atrial fibrillation with RVR (HCC)  HFrEF (heart failure with reduced ejection fraction) (HCC)  Iron  deficiency anemia due to chronic blood loss  Acquired hypothyroidism -     TSH  Encounter for immunization -     Flu vaccine HIGH DOSE PF(Fluzone Trivalent)   Assessment and Plan    Iron  deficiency anemia due to chronic blood loss Chronic iron  deficiency anemia with hemoglobin improving from 7.9 to 9.9. Undergoing monthly iron  infusions with close monitoring. Hematologist unsure of slow improvement cause. - Continue monthly iron  infusions through March. - Monitor hemoglobin levels every other week.  Type 2 diabetes mellitus with stage 3b chronic kidney disease, hypertension, and hyperlipidemia Type 2 diabetes mellitus with stage 3b chronic kidney disease. Blood pressure and heart rate stable. Cholesterol levels good in September. - Checked A1c level today.  HTN assoc with T2DM BP at goal per home readings.   HLD assoc with T2DM On statin.   Stage 3b CKD Stable. On farxiga .   Atrial fibrillation and heart failure with reduced ejection fraction Atrial fibrillation with fluctuating heart rate. Compliant with eliquis . - Monitor heart rate and report if consistently above 120 bpm. - Report persistent shortness of breath or worsening symptoms.  Acquired hypothyroidism Appetite increases with improved hemoglobin levels. - Checked thyroid  level today.  Subacute cough with musculoskeletal chest wall pain Dry cough improving slowly. Pain likely musculoskeletal due to coughing. Cough may be post-viral, resolving in up to 12 weeks. - Continue current management. - Report if cough worsens, produces sputum, or if fever develops.  HFrEF Euvolemic on exam.   Hypothyroidism Repeat TSH pending. Compliant with levothyroxine .   Flu vaccine today in  office.   Return in about 6 months (around 01/26/2025) for chronic follow up.    [name] CHRISTELLA Search, FNP "

## 2024-07-31 ENCOUNTER — Ambulatory Visit: Payer: Self-pay | Admitting: Family Medicine

## 2024-08-01 NOTE — Progress Notes (Addendum)
 [name] [name]                                          MRN: [id number]   08/01/2024   The [facility]BCI Quality Team Specialist reviewed this patient medical record for the purposes of chart review for care gap closure. The following were reviewed: abstraction for care gap closure-controlling blood pressure and kidney health evaluation for diabetes:eGFR  and uACR.    VBCI Quality Team

## 2024-08-06 ENCOUNTER — Inpatient Hospital Stay

## 2024-08-07 ENCOUNTER — Inpatient Hospital Stay

## 2024-08-07 ENCOUNTER — Inpatient Hospital Stay: Attending: Physician Assistant

## 2024-08-07 ENCOUNTER — Ambulatory Visit: Payer: Self-pay | Admitting: Physician Assistant

## 2024-08-07 VITALS — BP 129/78 | HR 93 | Temp 96.6°F | Resp 18 | Wt 200.0 lb

## 2024-08-07 DIAGNOSIS — D500 Iron deficiency anemia secondary to blood loss (chronic): Secondary | ICD-10-CM

## 2024-08-07 DIAGNOSIS — Z79899 Other long term (current) drug therapy: Secondary | ICD-10-CM | POA: Diagnosis not present

## 2024-08-07 LAB — CBC
HCT: 35.6 % — ABNORMAL LOW (ref 36.0–46.0)
Hemoglobin: 10.5 g/dL — ABNORMAL LOW (ref 12.0–15.0)
MCH: 28.3 pg (ref 26.0–34.0)
MCHC: 29.5 g/dL — ABNORMAL LOW (ref 30.0–36.0)
MCV: 96 fL (ref 80.0–100.0)
Platelets: 192 K/uL (ref 150–400)
RBC: 3.71 MIL/uL — ABNORMAL LOW (ref 3.87–5.11)
RDW: 17.4 % — ABNORMAL HIGH (ref 11.5–15.5)
WBC: 4.4 K/uL (ref 4.0–10.5)
nRBC: 0 % (ref 0.0–0.2)

## 2024-08-07 LAB — SAMPLE TO BLOOD BANK

## 2024-08-07 MED ORDER — SODIUM CHLORIDE 0.9 % IV SOLN: INTRAVENOUS | Status: DC

## 2024-08-07 MED ORDER — SODIUM CHLORIDE 0.9 % IV SOLN
510.0000 mg | Freq: Once | INTRAVENOUS | Status: AC
  Administered 2024-08-07: 510 mg via INTRAVENOUS
  Filled 2024-08-07: qty 510

## 2024-08-07 NOTE — Progress Notes (Signed)
 Patient blood count has been steadily improving, now with Hgb >10.0. We will continue with monthly IV Feraheme , but we will cut back on her CBC so that is only checked once a month (same day as her IV Feraheme ), rather than every 2 weeks.  Pleasant CHRISTELLA Barefoot, PA-C 08/07/2024 11:24 AM

## 2024-08-07 NOTE — Patient Instructions (Signed)

## 2024-08-07 NOTE — Progress Notes (Signed)
 Per pt she took her premedications at home.  Patient tolerated iron  infusion with no complaints voiced.  Peripheral IV site clean and dry with good blood return noted before and after infusion. Pt decline to be observed for 30 minutes post iron  infusion. VSS with discharge and left in satisfactory condition with no s/s of distress noted. Pt escorted via wheelchair to be driven home by daughter. All follow ups as scheduled.   Montgomery Rothlisberger

## 2024-08-20 ENCOUNTER — Inpatient Hospital Stay

## 2024-08-23 ENCOUNTER — Other Ambulatory Visit: Payer: Self-pay | Admitting: Family Medicine

## 2024-08-23 DIAGNOSIS — M1A9XX0 Chronic gout, unspecified, without tophus (tophi): Secondary | ICD-10-CM

## 2024-08-26 ENCOUNTER — Other Ambulatory Visit: Payer: Self-pay

## 2024-08-26 ENCOUNTER — Emergency Department (HOSPITAL_COMMUNITY)

## 2024-08-26 ENCOUNTER — Inpatient Hospital Stay (HOSPITAL_COMMUNITY)
Admission: EM | Admit: 2024-08-26 | Source: Home / Self Care | Attending: Internal Medicine | Admitting: Internal Medicine

## 2024-08-26 ENCOUNTER — Encounter (HOSPITAL_COMMUNITY): Payer: Self-pay | Admitting: Internal Medicine

## 2024-08-26 DIAGNOSIS — K31819 Angiodysplasia of stomach and duodenum without bleeding: Secondary | ICD-10-CM | POA: Diagnosis present

## 2024-08-26 DIAGNOSIS — I509 Heart failure, unspecified: Principal | ICD-10-CM

## 2024-08-26 DIAGNOSIS — N1832 Chronic kidney disease, stage 3b: Secondary | ICD-10-CM | POA: Diagnosis present

## 2024-08-26 DIAGNOSIS — M1A9XX0 Chronic gout, unspecified, without tophus (tophi): Secondary | ICD-10-CM | POA: Diagnosis present

## 2024-08-26 DIAGNOSIS — E039 Hypothyroidism, unspecified: Secondary | ICD-10-CM | POA: Diagnosis present

## 2024-08-26 DIAGNOSIS — E210 Primary hyperparathyroidism: Secondary | ICD-10-CM | POA: Diagnosis present

## 2024-08-26 DIAGNOSIS — E041 Nontoxic single thyroid nodule: Secondary | ICD-10-CM

## 2024-08-26 DIAGNOSIS — I5023 Acute on chronic systolic (congestive) heart failure: Secondary | ICD-10-CM | POA: Diagnosis present

## 2024-08-26 DIAGNOSIS — I5043 Acute on chronic combined systolic (congestive) and diastolic (congestive) heart failure: Secondary | ICD-10-CM | POA: Diagnosis present

## 2024-08-26 DIAGNOSIS — E785 Hyperlipidemia, unspecified: Secondary | ICD-10-CM | POA: Diagnosis present

## 2024-08-26 DIAGNOSIS — I4819 Other persistent atrial fibrillation: Secondary | ICD-10-CM | POA: Diagnosis present

## 2024-08-26 DIAGNOSIS — I4891 Unspecified atrial fibrillation: Secondary | ICD-10-CM

## 2024-08-26 DIAGNOSIS — Z86711 Personal history of pulmonary embolism: Secondary | ICD-10-CM | POA: Diagnosis present

## 2024-08-26 LAB — CBC
HCT: 41.3 % (ref 36.0–46.0)
Hemoglobin: 12.1 g/dL (ref 12.0–15.0)
MCH: 28.7 pg (ref 26.0–34.0)
MCHC: 29.3 g/dL — ABNORMAL LOW (ref 30.0–36.0)
MCV: 97.9 fL (ref 80.0–100.0)
Platelets: 218 10*3/uL (ref 150–400)
RBC: 4.22 MIL/uL (ref 3.87–5.11)
RDW: 18.1 % — ABNORMAL HIGH (ref 11.5–15.5)
WBC: 6.1 10*3/uL (ref 4.0–10.5)
nRBC: 0 % (ref 0.0–0.2)

## 2024-08-26 LAB — BASIC METABOLIC PANEL WITH GFR
Anion gap: 11 (ref 5–15)
BUN: 35 mg/dL — ABNORMAL HIGH (ref 8–23)
CO2: 23 mmol/L (ref 22–32)
Calcium: 12 mg/dL — ABNORMAL HIGH (ref 8.9–10.3)
Chloride: 106 mmol/L (ref 98–111)
Creatinine, Ser: 1.62 mg/dL — ABNORMAL HIGH (ref 0.44–1.00)
GFR, Estimated: 31 mL/min — ABNORMAL LOW
Glucose, Bld: 128 mg/dL — ABNORMAL HIGH (ref 70–99)
Potassium: 4.4 mmol/L (ref 3.5–5.1)
Sodium: 140 mmol/L (ref 135–145)

## 2024-08-26 LAB — DIFFERENTIAL
Abs Immature Granulocytes: 0.01 10*3/uL (ref 0.00–0.07)
Basophils Absolute: 0 10*3/uL (ref 0.0–0.1)
Basophils Relative: 1 %
Eosinophils Absolute: 0.1 10*3/uL (ref 0.0–0.5)
Eosinophils Relative: 2 %
Immature Granulocytes: 0 %
Lymphocytes Relative: 16 %
Lymphs Abs: 0.9 10*3/uL (ref 0.7–4.0)
Monocytes Absolute: 0.7 10*3/uL (ref 0.1–1.0)
Monocytes Relative: 11 %
Neutro Abs: 4.2 10*3/uL (ref 1.7–7.7)
Neutrophils Relative %: 70 %

## 2024-08-26 LAB — COMPREHENSIVE METABOLIC PANEL WITH GFR
ALT: 11 U/L (ref 0–44)
AST: 15 U/L (ref 15–41)
Albumin: 4 g/dL (ref 3.5–5.0)
Alkaline Phosphatase: 126 U/L (ref 38–126)
Anion gap: 11 (ref 5–15)
BUN: 36 mg/dL — ABNORMAL HIGH (ref 8–23)
CO2: 23 mmol/L (ref 22–32)
Calcium: 11.4 mg/dL — ABNORMAL HIGH (ref 8.9–10.3)
Chloride: 108 mmol/L (ref 98–111)
Creatinine, Ser: 1.52 mg/dL — ABNORMAL HIGH (ref 0.44–1.00)
GFR, Estimated: 34 mL/min — ABNORMAL LOW
Glucose, Bld: 126 mg/dL — ABNORMAL HIGH (ref 70–99)
Potassium: 4.4 mmol/L (ref 3.5–5.1)
Sodium: 141 mmol/L (ref 135–145)
Total Bilirubin: 0.5 mg/dL (ref 0.0–1.2)
Total Protein: 6.4 g/dL — ABNORMAL LOW (ref 6.5–8.1)

## 2024-08-26 LAB — PRO BRAIN NATRIURETIC PEPTIDE: Pro Brain Natriuretic Peptide: 8145 pg/mL — ABNORMAL HIGH

## 2024-08-26 LAB — TROPONIN T, HIGH SENSITIVITY
Troponin T High Sensitivity: 20 ng/L — ABNORMAL HIGH (ref 0–19)
Troponin T High Sensitivity: 22 ng/L — ABNORMAL HIGH (ref 0–19)

## 2024-08-26 MED ORDER — ACETAMINOPHEN 650 MG RE SUPP: 650.0000 mg | Freq: Four times a day (QID) | RECTAL | Status: AC | PRN

## 2024-08-26 MED ORDER — IOHEXOL 350 MG/ML SOLN
60.0000 mL | Freq: Once | INTRAVENOUS | Status: AC | PRN
  Administered 2024-08-26: 60 mL via INTRAVENOUS

## 2024-08-26 MED ORDER — FUROSEMIDE 10 MG/ML IJ SOLN
40.0000 mg | Freq: Two times a day (BID) | INTRAMUSCULAR | Status: DC
  Administered 2024-08-27 – 2024-08-28 (×3): 40 mg via INTRAVENOUS
  Filled 2024-08-26 (×3): qty 4

## 2024-08-26 MED ORDER — LEVOTHYROXINE SODIUM 50 MCG PO TABS
50.0000 ug | ORAL_TABLET | Freq: Every day | ORAL | Status: AC
  Administered 2024-08-27 – 2024-08-30 (×4): 50 ug via ORAL
  Filled 2024-08-26 (×4): qty 1

## 2024-08-26 MED ORDER — FERROUS SULFATE 325 (65 FE) MG PO TABS
325.0000 mg | ORAL_TABLET | Freq: Every day | ORAL | Status: AC
  Administered 2024-08-27 – 2024-08-30 (×4): 325 mg via ORAL
  Filled 2024-08-26 (×4): qty 1

## 2024-08-26 MED ORDER — ONDANSETRON HCL 4 MG PO TABS: 4.0000 mg | ORAL_TABLET | Freq: Four times a day (QID) | ORAL | Status: AC | PRN

## 2024-08-26 MED ORDER — DAPAGLIFLOZIN PROPANEDIOL 10 MG PO TABS
10.0000 mg | ORAL_TABLET | Freq: Every day | ORAL | Status: AC
  Administered 2024-08-27 – 2024-08-30 (×4): 10 mg via ORAL
  Filled 2024-08-26 (×4): qty 1

## 2024-08-26 MED ORDER — APIXABAN 5 MG PO TABS
5.0000 mg | ORAL_TABLET | Freq: Two times a day (BID) | ORAL | Status: AC
  Administered 2024-08-26 – 2024-08-30 (×9): 5 mg via ORAL
  Filled 2024-08-26 (×9): qty 1

## 2024-08-26 MED ORDER — OXYBUTYNIN CHLORIDE ER 10 MG PO TB24
10.0000 mg | ORAL_TABLET | Freq: Every day | ORAL | Status: AC
  Administered 2024-08-26 – 2024-08-30 (×5): 10 mg via ORAL
  Filled 2024-08-26 (×6): qty 1

## 2024-08-26 MED ORDER — METOPROLOL TARTRATE 5 MG/5ML IV SOLN: 2.5000 mg | INTRAVENOUS | Status: AC | PRN

## 2024-08-26 MED ORDER — SODIUM CHLORIDE 0.9% FLUSH
3.0000 mL | Freq: Two times a day (BID) | INTRAVENOUS | Status: AC
  Administered 2024-08-26 – 2024-08-30 (×9): 3 mL via INTRAVENOUS

## 2024-08-26 MED ORDER — ACETAMINOPHEN 325 MG PO TABS
650.0000 mg | ORAL_TABLET | Freq: Four times a day (QID) | ORAL | Status: AC | PRN
  Administered 2024-08-26 – 2024-08-27 (×2): 650 mg via ORAL
  Filled 2024-08-26 (×2): qty 2

## 2024-08-26 MED ORDER — ATORVASTATIN CALCIUM 10 MG PO TABS
20.0000 mg | ORAL_TABLET | Freq: Every day | ORAL | Status: AC
  Administered 2024-08-27 – 2024-08-30 (×4): 20 mg via ORAL
  Filled 2024-08-26 (×4): qty 2

## 2024-08-26 MED ORDER — FUROSEMIDE 10 MG/ML IJ SOLN
40.0000 mg | Freq: Once | INTRAMUSCULAR | Status: AC
  Administered 2024-08-26: 40 mg via INTRAVENOUS
  Filled 2024-08-26: qty 4

## 2024-08-26 MED ORDER — METOPROLOL SUCCINATE ER 50 MG PO TB24
200.0000 mg | ORAL_TABLET | Freq: Every day | ORAL | Status: AC
  Administered 2024-08-27 – 2024-08-30 (×4): 200 mg via ORAL
  Filled 2024-08-26 (×4): qty 4

## 2024-08-26 MED ORDER — ALLOPURINOL 100 MG PO TABS
100.0000 mg | ORAL_TABLET | Freq: Every day | ORAL | Status: AC
  Administered 2024-08-27 – 2024-08-30 (×4): 100 mg via ORAL
  Filled 2024-08-26 (×4): qty 1

## 2024-08-26 MED ORDER — SENNOSIDES-DOCUSATE SODIUM 8.6-50 MG PO TABS: 1.0000 | ORAL_TABLET | Freq: Every evening | ORAL | Status: AC | PRN

## 2024-08-26 MED ORDER — PANTOPRAZOLE SODIUM 40 MG PO TBEC
40.0000 mg | DELAYED_RELEASE_TABLET | Freq: Two times a day (BID) | ORAL | Status: AC
  Administered 2024-08-27 – 2024-08-30 (×8): 40 mg via ORAL
  Filled 2024-08-26 (×9): qty 1

## 2024-08-26 MED ORDER — ONDANSETRON HCL 4 MG/2ML IJ SOLN
4.0000 mg | Freq: Four times a day (QID) | INTRAMUSCULAR | Status: AC | PRN
  Administered 2024-08-28: 4 mg via INTRAVENOUS
  Filled 2024-08-26: qty 2

## 2024-08-26 NOTE — ED Triage Notes (Signed)
 Patient complains of shortness of breath, swelling in her lower legs, and left flank pain. Patient states she has a hx of CHF and takes a fluid pill. Patient states her fluid pill isn't working. Patient denies chest pain, but states her chest feels heavy, hurts more when she breaths. Denies cough and congestion. Rates 8/10 when the pain hits.

## 2024-08-26 NOTE — Hospital Course (Signed)
 [name] [name] is a 84 y.o. female with medical history significant for persistent atrial fibrillation on Eliquis , s/p Watchman device 05/2023, HFmrEF, history of recurrent PE/DVT, CKD stage IIIb, GAVE, iron  deficiency anemia due to chronic blood loss, primary hyperparathyroidism with chronic hypercalcemia, HTN, HLD, hypothyroidism, gout who is admitted with acute on chronic HFmrEF.

## 2024-08-27 ENCOUNTER — Encounter (HOSPITAL_COMMUNITY): Payer: Self-pay | Admitting: Internal Medicine

## 2024-08-27 ENCOUNTER — Inpatient Hospital Stay (HOSPITAL_COMMUNITY)

## 2024-08-27 DIAGNOSIS — I5023 Acute on chronic systolic (congestive) heart failure: Secondary | ICD-10-CM

## 2024-08-27 LAB — ECHOCARDIOGRAM COMPLETE
AR max vel: 2.79 cm2
AV Area VTI: 2.87 cm2
AV Area mean vel: 2.68 cm2
AV Mean grad: 1 mmHg
AV Peak grad: 2.5 mmHg
Ao pk vel: 0.78 m/s
Area-P 1/2: 6.47 cm2
Calc EF: 34.4 %
Height: 63 in
MV M vel: 4.69 m/s
MV Peak grad: 87.8 mmHg
MV VTI: 1.35 cm2
Radius: 0.7 cm
S' Lateral: 4.8 cm
Single Plane A2C EF: 34.5 %
Single Plane A4C EF: 33.6 %
Weight: 3388.8 [oz_av]

## 2024-08-27 LAB — BASIC METABOLIC PANEL WITH GFR
Anion gap: 11 (ref 5–15)
BUN: 33 mg/dL — ABNORMAL HIGH (ref 8–23)
CO2: 21 mmol/L — ABNORMAL LOW (ref 22–32)
Calcium: 11.5 mg/dL — ABNORMAL HIGH (ref 8.9–10.3)
Chloride: 107 mmol/L (ref 98–111)
Creatinine, Ser: 1.41 mg/dL — ABNORMAL HIGH (ref 0.44–1.00)
GFR, Estimated: 37 mL/min — ABNORMAL LOW
Glucose, Bld: 111 mg/dL — ABNORMAL HIGH (ref 70–99)
Potassium: 4.2 mmol/L (ref 3.5–5.1)
Sodium: 139 mmol/L (ref 135–145)

## 2024-08-27 LAB — CBC
HCT: 38.4 % (ref 36.0–46.0)
Hemoglobin: 11.4 g/dL — ABNORMAL LOW (ref 12.0–15.0)
MCH: 28.9 pg (ref 26.0–34.0)
MCHC: 29.7 g/dL — ABNORMAL LOW (ref 30.0–36.0)
MCV: 97.2 fL (ref 80.0–100.0)
Platelets: 186 10*3/uL (ref 150–400)
RBC: 3.95 MIL/uL (ref 3.87–5.11)
RDW: 17.9 % — ABNORMAL HIGH (ref 11.5–15.5)
WBC: 5.3 10*3/uL (ref 4.0–10.5)
nRBC: 0 % (ref 0.0–0.2)

## 2024-08-27 LAB — MAGNESIUM: Magnesium: 2 mg/dL (ref 1.7–2.4)

## 2024-08-27 MED ORDER — PERFLUTREN LIPID MICROSPHERE
1.0000 mL | INTRAVENOUS | Status: AC | PRN
  Administered 2024-08-27: 3 mL via INTRAVENOUS

## 2024-08-27 NOTE — Progress Notes (Signed)
 " PROGRESS NOTE    [name] [name]  FMW:[id number] DOB: 04/04/1941 DOA: 08/26/2024 PCP: Joesph Annabella HERO, FNP   Brief Narrative: 84 y.o. female with medical history significant for persistent atrial fibrillation on Eliquis , s/p Watchman device 05/2023, HFmrEF, history of recurrent PE/DVT, CKD stage IIIb, GAVE, iron  deficiency anemia due to chronic blood loss, primary hyperparathyroidism with chronic hypercalcemia, HTN, HLD, hypothyroidism, gout admitted with dyspnea on exertion orthopnea and paroxysmal nocturnal dyspnea.  She has been taking Lasix  20 mg as needed.  She started taking it daily without improvement so she decided to come to the ED.  Her BNP was 8100, troponin T was 20, creatinine 1.52   Ct chest No central pulmonary embolism. Poor opacification in the right posterior lower lobe pulmonary arteries favored to represent mixing artifact as there is gradual tapering to non-opacification however pulmonary embolism cannot be excluded. CHF with pulmonary edema, small right and trace left pleural effusions, elevated right heart pressures, and new small pericardial effusion. 2.5 cm left thyroid  nodule; recommend non-emergent thyroid  ultrasound if not performed previously .     Assessment & Plan:   Principal Problem:   Acute on chronic heart failure with mildly reduced ejection fraction (HFmrEF, 41-49%) (HCC) Active Problems:   Persistent atrial fibrillation (HCC)   Chronic kidney disease, stage 3b (HCC)   Hypothyroidism   Dyslipidemia   Chronic gout without tophus   Hypercalcemia   Primary hyperparathyroidism   History of GAVE (gastric antral vascular ectasia)   History of pulmonary embolism   #1 acute on chronic systolic heart failure exacerbation patient admitted with orthopnea PND lower extremity edema with elevated BNP and findings consistent with pulmonary edema.   Compared to her echo from June 2025 current echo reveals EF of 25 to 30% down from 40 to 45%, severely  decreased function of the left ventricle with global hypokinesis moderately elevated pulmonary artery systolic pressure.  Severely dilated left atrium and right atrium small pericardial effusion.  Moderate MR. I&O indicates she is negative by 410 Creatinine 1.41 from 1.62 on admission Continue Lasix  and beta-blocker and Farxiga . Defer to cardiology consider starting Entresto Will consult cardiology.   #2 chronic atrial fibrillation status post Watchman device in 2024 controlled Eliquis  and Toprol  TSH 3.790  #3 CKD stage IIIb stable will monitor closely on diuresis and possibly adding Entresto  #4 iron  deficiency anemia due to blood loss she gets IV iron  transfusions through cancer center continue oral iron  and Protonix   #5 chronic hypercalcemia secondary to primary hyperparathyroidism status post partial parathyroidectomy  #6 hypothyroidism and thyroid  nodule outpatient thyroid  ultrasound  #7 hyperlipidemia on statin  #8 gout.  Allopurinol    Estimated body mass index is 37.52 kg/m as calculated from the following:   Height as of this encounter: 5' 3 (1.6 m).   Weight as of this encounter: 96.1 kg.  DVT prophylaxis: Eliquis  Code Status: DNR Family Communication: None Disposition Plan:  Status is: Inpatient Remains inpatient appropriate because: Acute illness   Consultants:  Cardiology  Procedures: None Antimicrobials: None Subjective: She is resting in bed she feels improved than yesterday still has some lower extremity edema has been urinating a lot with the Lasix  she remains on room air able to maintain her saturation on room air  Objective: Vitals:   08/26/24 1754 08/26/24 2144 08/27/24 0309 08/27/24 0700  BP:  (!) 143/104 (!) 131/100 (!) 142/101  Pulse:  (!) 109 (!) 105 88  Resp:  18 16 20   Temp: 98 F (36.7 C)  97.9 F (36.6 C) 98.1 F (36.7 C) 98 F (36.7 C)  TempSrc:  Oral  Oral  SpO2:  95% 94% 92%  Weight:  96.1 kg    Height:  5' 3 (1.6 m)       Intake/Output Summary (Last 24 hours) at 08/27/2024 1025 Last data filed at 08/27/2024 0800 Gross per 24 hour  Intake 240 ml  Output 650 ml  Net -410 ml   Filed Weights   08/26/24 2144  Weight: 96.1 kg    Examination:  General exam: Appears in no acute distress  respiratory system: Diminished breath sounds at the bases. Respiratory effort normal. Cardiovascular system: irreg Gastrointestinal system: Abdomen is nondistended, soft and nontender. No organomegaly or masses felt. Normal bowel sounds heard. Central nervous system: Alert and oriented. No focal neurological deficits. Extremities: 1+ edema bilaterally   Data Reviewed: I have personally reviewed following labs and imaging studies  CBC: Recent Labs  Lab 08/26/24 1610 08/27/24 0356  WBC 6.1 5.3  NEUTROABS 4.2  --   HGB 12.1 11.4*  HCT 41.3 38.4  MCV 97.9 97.2  PLT 218 186   Basic Metabolic Panel: Recent Labs  Lab 08/26/24 1610 08/26/24 1633 08/27/24 0356  NA 140 141 139  K 4.4 4.4 4.2  CL 106 108 107  CO2 23 23 21*  GLUCOSE 128* 126* 111*  BUN 35* 36* 33*  CREATININE 1.62* 1.52* 1.41*  CALCIUM  12.0* 11.4* 11.5*  MG  --   --  2.0   GFR: Estimated Creatinine Clearance: 33.4 mL/min (A) (by C-G formula based on SCr of 1.41 mg/dL (H)). Liver Function Tests: Recent Labs  Lab 08/26/24 1633  AST 15  ALT 11  ALKPHOS 126  BILITOT 0.5  PROT 6.4*  ALBUMIN  4.0   No results for input(s): LIPASE, AMYLASE in the last 168 hours. No results for input(s): AMMONIA in the last 168 hours. Coagulation Profile: No results for input(s): INR, PROTIME in the last 168 hours. Cardiac Enzymes: No results for input(s): CKTOTAL, CKMB, CKMBINDEX, TROPONINI in the last 168 hours. BNP (last 3 results) Recent Labs    04/19/24 1632 08/26/24 1633  PROBNP 3,865* 8,145.0*   HbA1C: No results for input(s): HGBA1C in the last 72 hours. CBG: No results for input(s): GLUCAP in the last 168  hours. Lipid Profile: No results for input(s): CHOL, HDL, LDLCALC, TRIG, CHOLHDL, LDLDIRECT in the last 72 hours. Thyroid  Function Tests: No results for input(s): TSH, T4TOTAL, FREET4, T3FREE, THYROIDAB in the last 72 hours. Anemia Panel: No results for input(s): VITAMINB12, FOLATE, FERRITIN, TIBC, IRON , RETICCTPCT in the last 72 hours. Sepsis Labs: No results for input(s): PROCALCITON, LATICACIDVEN in the last 168 hours.  No results found for this or any previous visit (from the past 240 hours).       Radiology Studies: CT Angio Chest PE W and/or Wo Contrast Result Date: 08/26/2024 EXAM: CTA CHEST 08/26/2024 05:38:58 PM TECHNIQUE: CTA of the chest was performed after the administration of intravenous contrast. Multiplanar reformatted images are provided for review. MIP images are provided for review. Automated exposure control, iterative reconstruction, and/or weight based adjustment of the mA/kV was utilized to reduce the radiation dose to as low as reasonably achievable. COMPARISON: Same day x-ray and CT 01/14/2024. CLINICAL HISTORY: Pulmonary embolism (PE) suspected, high prob. Suspected pulmonary embolism (PE), high probability. FINDINGS: PULMONARY ARTERIES: Reduced cardiac output as evidenced by slow pulmonary artery flow and mixing artifact in the segmental and subsegmental branches. There is no central pulmonary embolism.  There is poor opacification in the right posterior lower lobe pulmonary arteries favored to represent mixing artifact as there is gradual tapering to non-opacification however pulmonary embolism cannot be excluded. Main pulmonary artery is dilated at 38 mm. MEDIASTINUM: Severe cardiomegaly. Atrial appendage occlusion device. New small pericardial effusion. Coronary artery and aortic atherosclerotic calcifications. Reflux of contrast into the IVC (inferior vena cava) and hepatic veins compatible with elevated right heart pressures. There  is no acute abnormality of the thoracic aorta. LYMPH NODES: No mediastinal, hilar or axillary lymphadenopathy. LUNGS AND PLEURA: Diffuse interlobular septal thickening and patchy ground glass opacities greatest in the lower lungs. Small right and trace left pleural effusions and compressive atelectasis. No pneumothorax. UPPER ABDOMEN: Limited images of the upper abdomen demonstrate reflux of contrast into the hepatic veins. SOFT TISSUES AND BONES: 2.5 cm left thyroid  nodule. No acute bone or soft tissue abnormality. IMPRESSION: 1. No central pulmonary embolism. Poor opacification in the right posterior lower lobe pulmonary arteries favored to represent mixing artifact as there is gradual tapering to non-opacification however pulmonary embolism cannot be excluded. 2. CHF with pulmonary edema, small right and trace left pleural effusions, elevated right heart pressures, and new small pericardial effusion. 3. 2.5 cm left thyroid  nodule; recommend non-emergent thyroid  ultrasound if not performed previously . Electronically signed by: Norman Gatlin MD 08/26/2024 05:57 PM EST RP Workstation: [id number]   DG Chest 2 View Result Date: 08/26/2024 CLINICAL DATA:  Chest heaviness. EXAM: CHEST - 2 VIEW COMPARISON:  April 19, 2024 FINDINGS: The cardiac silhouette is moderately enlarged and mildly increased in size when compared to the prior study low lung volumes are noted. A radiopaque left atrial appendage closure device is seen. There is mild prominence of the central pulmonary vasculature. Mild atelectasis and/or infiltrate is seen within the bilateral lung bases. A small right pleural effusion is present. No pneumothorax is identified. The visualized skeletal structures are unremarkable. IMPRESSION: 1. Mildly increased cardiomegaly with mild pulmonary vascular congestion. Sequelae associated with a pericardial effusion cannot be excluded. Further evaluation with chest CT is recommended. 2. Mild bibasilar atelectasis  and/or infiltrate. 3. Small right pleural effusion. Electronically Signed   By: Suzen Dials M.D.   On: 08/26/2024 16:42    Scheduled Meds:  allopurinol   100 mg Oral Daily   apixaban   5 mg Oral BID   atorvastatin   20 mg Oral Daily   dapagliflozin  propanediol  10 mg Oral Daily   ferrous sulfate   325 mg Oral Q breakfast   furosemide   40 mg Intravenous Q12H   levothyroxine   50 mcg Oral Q0600   metoprolol   200 mg Oral Daily   oxybutynin   10 mg Oral QHS   pantoprazole   40 mg Oral BID AC   sodium chloride  flush  3 mL Intravenous Q12H   Continuous Infusions:   LOS: 1 day   Almarie KANDICE Hoots, MD  08/27/2024, 10:25 AM   "

## 2024-08-27 NOTE — Plan of Care (Signed)
  Problem: Clinical Measurements: Goal: Ability to maintain clinical measurements within normal limits will improve Outcome: Progressing Goal: Diagnostic test results will improve Outcome: Progressing   

## 2024-08-28 DIAGNOSIS — I5023 Acute on chronic systolic (congestive) heart failure: Secondary | ICD-10-CM | POA: Diagnosis not present

## 2024-08-28 LAB — COMPREHENSIVE METABOLIC PANEL WITH GFR
ALT: 9 U/L (ref 0–44)
AST: 15 U/L (ref 15–41)
Albumin: 3.7 g/dL (ref 3.5–5.0)
Alkaline Phosphatase: 122 U/L (ref 38–126)
Anion gap: 8 (ref 5–15)
BUN: 33 mg/dL — ABNORMAL HIGH (ref 8–23)
CO2: 28 mmol/L (ref 22–32)
Calcium: 11.5 mg/dL — ABNORMAL HIGH (ref 8.9–10.3)
Chloride: 105 mmol/L (ref 98–111)
Creatinine, Ser: 1.56 mg/dL — ABNORMAL HIGH (ref 0.44–1.00)
GFR, Estimated: 33 mL/min — ABNORMAL LOW
Glucose, Bld: 129 mg/dL — ABNORMAL HIGH (ref 70–99)
Potassium: 4.4 mmol/L (ref 3.5–5.1)
Sodium: 141 mmol/L (ref 135–145)
Total Bilirubin: 0.4 mg/dL (ref 0.0–1.2)
Total Protein: 6 g/dL — ABNORMAL LOW (ref 6.5–8.1)

## 2024-08-28 MED ORDER — TRAMADOL HCL 50 MG PO TABS
50.0000 mg | ORAL_TABLET | Freq: Four times a day (QID) | ORAL | Status: AC | PRN
  Administered 2024-08-28: 50 mg via ORAL
  Filled 2024-08-28: qty 1

## 2024-08-28 MED ORDER — FUROSEMIDE 10 MG/ML IJ SOLN
60.0000 mg | Freq: Two times a day (BID) | INTRAMUSCULAR | Status: DC
  Administered 2024-08-28 – 2024-08-30 (×4): 60 mg via INTRAVENOUS
  Filled 2024-08-28 (×4): qty 6

## 2024-08-28 NOTE — Progress Notes (Signed)
 ` PROGRESS NOTE    [name] [name]  FMW:[id number] DOB: 1940/09/22 DOA: 08/26/2024 PCP: Joesph Annabella HERO, FNP   Brief Narrative: 84 y.o. female with medical history significant for persistent atrial fibrillation on Eliquis , s/p Watchman device 05/2023, HFmrEF, history of recurrent PE/DVT, CKD stage IIIb, GAVE, iron  deficiency anemia due to chronic blood loss, primary hyperparathyroidism with chronic hypercalcemia, HTN, HLD, hypothyroidism, gout admitted with dyspnea on exertion orthopnea and paroxysmal nocturnal dyspnea.  She has been taking Lasix  20 mg as needed.  She started taking it daily without improvement so she decided to come to the ED.  Her BNP was 8100, troponin T was 20, creatinine 1.52   Ct chest No central pulmonary embolism. Poor opacification in the right posterior lower lobe pulmonary arteries favored to represent mixing artifact as there is gradual tapering to non-opacification however pulmonary embolism cannot be excluded. CHF with pulmonary edema, small right and trace left pleural effusions, elevated right heart pressures, and new small pericardial effusion. 2.5 cm left thyroid  nodule; recommend non-emergent thyroid  ultrasound if not performed previously .     Assessment & Plan:   Principal Problem:   Acute on chronic heart failure with mildly reduced ejection fraction (HFmrEF, 41-49%) (HCC) Active Problems:   Persistent atrial fibrillation (HCC)   Chronic kidney disease, stage 3b (HCC)   Hypothyroidism   Dyslipidemia   Chronic gout without tophus   Hypercalcemia   Acute on chronic heart failure with reduced ejection fraction (HFrEF, <= 40%) and combined systolic and diastolic dysfunction (HCC)   Primary hyperparathyroidism   History of GAVE (gastric antral vascular ectasia)   History of pulmonary embolism   #1 acute on chronic systolic heart failure exacerbation patient admitted with orthopnea PND lower extremity edema with elevated BNP and findings  consistent with pulmonary edema.   Compared to her echo from June 2025 current echo reveals EF of 25 to 30% down from 40 to 45%, severely decreased function of the left ventricle with global hypokinesis moderately elevated pulmonary artery systolic pressure.  Severely dilated left atrium and right atrium small pericardial effusion.  Moderate MR. I&O indicates she is negative by 990 Creatinine 1.41 from 1.62 on admission LABS PENDING TODAY Continue Lasix  and beta-blocker and Farxiga . Defer to cardiology consider starting Entresto Consult pt   #2 chronic atrial fibrillation status post Watchman device in 2024 controlled Eliquis  and Toprol  TSH 3.790  #3 CKD stage IIIb stable will monitor closely on diuresis and possibly adding Entresto  #4 iron  deficiency anemia due to blood loss she gets IV iron  transfusions through cancer center continue oral iron  and Protonix   #5 chronic hypercalcemia secondary to primary hyperparathyroidism status post partial parathyroidectomy  #6 hypothyroidism and thyroid  nodule outpatient thyroid  ultrasound  #7 hyperlipidemia on statin  #8 gout.  Allopurinol    Estimated body mass index is 36.44 kg/m as calculated from the following:   Height as of this encounter: 5' 3 (1.6 m).   Weight as of this encounter: 93.3 kg.  DVT prophylaxis: Eliquis  Code Status: DNR Family Communication: None Disposition Plan:  Status is: Inpatient Remains inpatient appropriate because: Acute illness   Consultants:  Cardiology  Procedures: None Antimicrobials: None Subjective: STILL can't catch breath felt nauseous last night Feels better now..  Objective: Vitals:   08/27/24 2333 08/28/24 0500 08/28/24 0541 08/28/24 0905  BP: 130/88  126/87 109/71  Pulse: 98  87 84  Resp:   18   Temp: (!) 97.4 F (36.3 C)  97.9 F (36.6 C)  TempSrc: Oral  Oral   SpO2: 93%  94%   Weight:  93.3 kg    Height:        Intake/Output Summary (Last 24 hours) at 08/28/2024 0944 Last  data filed at 08/28/2024 0847 Gross per 24 hour  Intake 460 ml  Output 1450 ml  Net -990 ml   Filed Weights   08/26/24 2144 08/28/24 0500  Weight: 96.1 kg 93.3 kg    Examination:  General exam: Appears in no acute distress  respiratory system: Diminished breath sounds at the bases. Respiratory effort normal. Cardiovascular system: irreg Gastrointestinal system: Abdomen is nondistended, soft and nontender. No organomegaly or masses felt. Normal bowel sounds heard. Central nervous system: Alert and oriented. No focal neurological deficits. Extremities: 1+ edema bilaterally   Data Reviewed: I have personally reviewed following labs and imaging studies  CBC: Recent Labs  Lab 08/26/24 1610 08/27/24 0356  WBC 6.1 5.3  NEUTROABS 4.2  --   HGB 12.1 11.4*  HCT 41.3 38.4  MCV 97.9 97.2  PLT 218 186   Basic Metabolic Panel: Recent Labs  Lab 08/26/24 1610 08/26/24 1633 08/27/24 0356  NA 140 141 139  K 4.4 4.4 4.2  CL 106 108 107  CO2 23 23 21*  GLUCOSE 128* 126* 111*  BUN 35* 36* 33*  CREATININE 1.62* 1.52* 1.41*  CALCIUM  12.0* 11.4* 11.5*  MG  --   --  2.0   GFR: Estimated Creatinine Clearance: 32.8 mL/min (A) (by C-G formula based on SCr of 1.41 mg/dL (H)). Liver Function Tests: Recent Labs  Lab 08/26/24 1633  AST 15  ALT 11  ALKPHOS 126  BILITOT 0.5  PROT 6.4*  ALBUMIN  4.0   No results for input(s): LIPASE, AMYLASE in the last 168 hours. No results for input(s): AMMONIA in the last 168 hours. Coagulation Profile: No results for input(s): INR, PROTIME in the last 168 hours. Cardiac Enzymes: No results for input(s): CKTOTAL, CKMB, CKMBINDEX, TROPONINI in the last 168 hours. BNP (last 3 results) Recent Labs    04/19/24 1632 08/26/24 1633  PROBNP 3,865* 8,145.0*   HbA1C: No results for input(s): HGBA1C in the last 72 hours. CBG: No results for input(s): GLUCAP in the last 168 hours. Lipid Profile: No results for input(s):  CHOL, HDL, LDLCALC, TRIG, CHOLHDL, LDLDIRECT in the last 72 hours. Thyroid  Function Tests: No results for input(s): TSH, T4TOTAL, FREET4, T3FREE, THYROIDAB in the last 72 hours. Anemia Panel: No results for input(s): VITAMINB12, FOLATE, FERRITIN, TIBC, IRON , RETICCTPCT in the last 72 hours. Sepsis Labs: No results for input(s): PROCALCITON, LATICACIDVEN in the last 168 hours.  No results found for this or any previous visit (from the past 240 hours).       Radiology Studies: ECHOCARDIOGRAM COMPLETE Result Date: 08/27/2024    ECHOCARDIOGRAM REPORT   Patient Name:   LANCE GALAS Date of Exam: 08/27/2024 Medical Rec #:  [id number]           Height:       63.0 in Accession #:    [id number]          Weight:       211.9 lb Date of Birth:  05-13-1941           BSA:          1.982 m Patient Age:    84 years            BP:           142/101 mmHg  Patient Gender: F                   HR:           88 bpm. Exam Location:  Inpatient Procedure: 2D Echo, Cardiac Doppler, Color Doppler and Intracardiac            Opacification Agent (Both Spectral and Color Flow Doppler were            utilized during procedure). Indications:    CHF  History:        Patient has prior history of Echocardiogram examinations, most                 recent 01/15/2024. CHF, Arrythmias:Atrial Fibrillation; Risk                 Factors:Diabetes, Dyslipidemia and Hypertension.  Sonographer:    Odella Brewster Referring Phys: [id number] VISHAL R PATEL  Sonographer Comments: Technically difficult study due to poor echo windows. Image acquisition challenging due to patient body habitus and Image acquisition challenging due to respiratory motion. IMPRESSIONS  1. Left ventricular ejection fraction, by estimation, is 25 to 30%. The left ventricle has severely decreased function. The left ventricle demonstrates global hypokinesis. The left ventricular internal cavity size was moderately dilated. There is moderate  eccentric left ventricular hypertrophy. Left ventricular diastolic function could not be evaluated. Elevated left atrial pressure.  2. Right ventricular systolic function is moderately reduced. The right ventricular size is mildly enlarged. There is moderately elevated pulmonary artery systolic pressure.  3. Left atrial size was severely dilated.  4. Right atrial size was severely dilated.  5. A small pericardial effusion is present.  6. The mitral valve is degenerative. Moderate mitral valve regurgitation. No evidence of mitral stenosis.  7. Tricuspid valve regurgitation is mild to moderate.  8. The aortic valve is tricuspid. Aortic valve regurgitation is not visualized. No aortic stenosis is present.  9. The inferior vena cava is dilated in size with <50% respiratory variability, suggesting right atrial pressure of 15 mmHg. Comparison(s): LV function has worsened. FINDINGS  Left Ventricle: Left ventricular ejection fraction, by estimation, is 25 to 30%. The left ventricle has severely decreased function. The left ventricle demonstrates global hypokinesis. Definity  contrast agent was given IV to delineate the left ventricular endocardial borders. The left ventricular internal cavity size was moderately dilated. There is moderate eccentric left ventricular hypertrophy. Left ventricular diastolic function could not be evaluated due to atrial fibrillation. Left ventricular diastolic function could not be evaluated. Elevated left atrial pressure. Right Ventricle: The right ventricular size is mildly enlarged. No increase in right ventricular wall thickness. Right ventricular systolic function is moderately reduced. There is moderately elevated pulmonary artery systolic pressure. The tricuspid regurgitant velocity is 3.18 m/s, and with an assumed right atrial pressure of 15 mmHg, the estimated right ventricular systolic pressure is 55.4 mmHg. Left Atrium: Left atrial size was severely dilated. Right Atrium: Right atrial  size was severely dilated. Pericardium: A small pericardial effusion is present. Mitral Valve: The mitral valve is degenerative in appearance. Mild to moderate mitral annular calcification. Moderate mitral valve regurgitation. No evidence of mitral valve stenosis. MV peak gradient, 6.0 mmHg. The mean mitral valve gradient is 3.0 mmHg. Tricuspid Valve: The tricuspid valve is normal in structure. Tricuspid valve regurgitation is mild to moderate. No evidence of tricuspid stenosis. Aortic Valve: The aortic valve is tricuspid. Aortic valve regurgitation is not visualized. No aortic stenosis is present. Aortic valve mean gradient measures  1.0 mmHg. Aortic valve peak gradient measures 2.5 mmHg. Aortic valve area, by VTI measures 2.87 cm. Pulmonic Valve: The pulmonic valve was normal in structure. Pulmonic valve regurgitation is mild. No evidence of pulmonic stenosis. Aorta: The aortic root, ascending aorta and aortic arch are all structurally normal, with no evidence of dilitation or obstruction. Venous: The inferior vena cava is dilated in size with less than 50% respiratory variability, suggesting right atrial pressure of 15 mmHg. IAS/Shunts: The interatrial septum appears to be lipomatous. No atrial level shunt detected by color flow Doppler. Additional Comments: 3D was performed not requiring image post processing on an independent workstation and was abnormal.  LEFT VENTRICLE PLAX 2D LVIDd:         5.70 cm      Diastology LVIDs:         4.80 cm      LV e' medial:    5.98 cm/s LV PW:         1.20 cm      LV E/e' medial:  20.9 LV IVS:        1.10 cm      LV e' lateral:   8.27 cm/s LVOT diam:     2.20 cm      LV E/e' lateral: 15.1 LV SV:         30 LV SV Index:   15 LVOT Area:     3.80 cm LV IVRT:       92 msec                             3D Volume EF:                             3D EF:        44 % LV Volumes (MOD)            LV EDV:       162 ml LV vol d, MOD A2C: 171.0 ml LV ESV:       90 ml LV vol d, MOD A4C: 127.0  ml LV SV:        72 ml LV vol s, MOD A2C: 112.0 ml LV vol s, MOD A4C: 84.3 ml LV SV MOD A2C:     59.0 ml LV SV MOD A4C:     127.0 ml LV SV MOD BP:      51.2 ml RIGHT VENTRICLE            IVC RV S prime:     6.85 cm/s  IVC diam: 2.70 cm TAPSE (M-mode): 0.9 cm LEFT ATRIUM              Index        RIGHT ATRIUM           Index LA diam:        6.00 cm  3.03 cm/m   RA Area:     36.00 cm LA Vol (A2C):   159.0 ml 80.24 ml/m  RA Volume:   139.00 ml 70.15 ml/m LA Vol (A4C):   183.0 ml 92.35 ml/m LA Biplane Vol: 173.0 ml 87.30 ml/m  AORTIC VALVE                    PULMONIC VALVE AV Area (Vmax):    2.79 cm     PV Vmax:  0.95 m/s AV Area (Vmean):   2.68 cm     PV Peak grad:     3.6 mmHg AV Area (VTI):     2.87 cm     PR End Diast Vel: 7.51 msec AV Vmax:           78.40 cm/s AV Vmean:          56.200 cm/s AV VTI:            0.106 m AV Peak Grad:      2.5 mmHg AV Mean Grad:      1.0 mmHg LVOT Vmax:         57.50 cm/s LVOT Vmean:        39.600 cm/s LVOT VTI:          0.080 m LVOT/AV VTI ratio: 0.75  AORTA Ao Root diam: 3.70 cm Ao Asc diam:  3.70 cm MITRAL VALVE                  TRICUSPID VALVE MV Area (PHT): 6.47 cm       TR Peak grad:   40.4 mmHg MV Area VTI:   1.35 cm       TR Vmax:        318.00 cm/s MV Peak grad:  6.0 mmHg MV Mean grad:  3.0 mmHg       SHUNTS MV Vmax:       1.22 m/s       Systemic VTI:  0.08 m MV Vmean:      75.5 cm/s      Systemic Diam: 2.20 cm MR Peak grad:    87.8 mmHg MR Mean grad:    59.0 mmHg MR Vmax:         468.50 cm/s MR Vmean:        366.0 cm/s MR PISA:         3.08 cm MR PISA Eff ROA: 27 mm MR PISA Radius:  0.70 cm MV E velocity: 125.00 cm/s MV A velocity: 40.50 cm/s MV E/A ratio:  3.09 Morene Brownie Electronically signed by Morene Brownie Signature Date/Time: 08/27/2024/12:22:32 PM    Final    CT Angio Chest PE W and/or Wo Contrast Result Date: 08/26/2024 EXAM: CTA CHEST 08/26/2024 05:38:58 PM TECHNIQUE: CTA of the chest was performed after the administration of intravenous  contrast. Multiplanar reformatted images are provided for review. MIP images are provided for review. Automated exposure control, iterative reconstruction, and/or weight based adjustment of the mA/kV was utilized to reduce the radiation dose to as low as reasonably achievable. COMPARISON: Same day x-ray and CT 01/14/2024. CLINICAL HISTORY: Pulmonary embolism (PE) suspected, high prob. Suspected pulmonary embolism (PE), high probability. FINDINGS: PULMONARY ARTERIES: Reduced cardiac output as evidenced by slow pulmonary artery flow and mixing artifact in the segmental and subsegmental branches. There is no central pulmonary embolism. There is poor opacification in the right posterior lower lobe pulmonary arteries favored to represent mixing artifact as there is gradual tapering to non-opacification however pulmonary embolism cannot be excluded. Main pulmonary artery is dilated at 38 mm. MEDIASTINUM: Severe cardiomegaly. Atrial appendage occlusion device. New small pericardial effusion. Coronary artery and aortic atherosclerotic calcifications. Reflux of contrast into the IVC (inferior vena cava) and hepatic veins compatible with elevated right heart pressures. There is no acute abnormality of the thoracic aorta. LYMPH NODES: No mediastinal, hilar or axillary lymphadenopathy. LUNGS AND PLEURA: Diffuse interlobular septal thickening and patchy ground glass opacities greatest in the lower lungs. Small right  and trace left pleural effusions and compressive atelectasis. No pneumothorax. UPPER ABDOMEN: Limited images of the upper abdomen demonstrate reflux of contrast into the hepatic veins. SOFT TISSUES AND BONES: 2.5 cm left thyroid  nodule. No acute bone or soft tissue abnormality. IMPRESSION: 1. No central pulmonary embolism. Poor opacification in the right posterior lower lobe pulmonary arteries favored to represent mixing artifact as there is gradual tapering to non-opacification however pulmonary embolism cannot be  excluded. 2. CHF with pulmonary edema, small right and trace left pleural effusions, elevated right heart pressures, and new small pericardial effusion. 3. 2.5 cm left thyroid  nodule; recommend non-emergent thyroid  ultrasound if not performed previously . Electronically signed by: Norman Gatlin MD 08/26/2024 05:57 PM EST RP Workstation: [id number]   DG Chest 2 View Result Date: 08/26/2024 CLINICAL DATA:  Chest heaviness. EXAM: CHEST - 2 VIEW COMPARISON:  April 19, 2024 FINDINGS: The cardiac silhouette is moderately enlarged and mildly increased in size when compared to the prior study low lung volumes are noted. A radiopaque left atrial appendage closure device is seen. There is mild prominence of the central pulmonary vasculature. Mild atelectasis and/or infiltrate is seen within the bilateral lung bases. A small right pleural effusion is present. No pneumothorax is identified. The visualized skeletal structures are unremarkable. IMPRESSION: 1. Mildly increased cardiomegaly with mild pulmonary vascular congestion. Sequelae associated with a pericardial effusion cannot be excluded. Further evaluation with chest CT is recommended. 2. Mild bibasilar atelectasis and/or infiltrate. 3. Small right pleural effusion. Electronically Signed   By: Suzen Dials M.D.   On: 08/26/2024 16:42    Scheduled Meds:  allopurinol   100 mg Oral Daily   apixaban   5 mg Oral BID   atorvastatin   20 mg Oral Daily   dapagliflozin  propanediol  10 mg Oral Daily   ferrous sulfate   325 mg Oral Q breakfast   furosemide   40 mg Intravenous Q12H   levothyroxine   50 mcg Oral Q0600   metoprolol   200 mg Oral Daily   oxybutynin   10 mg Oral QHS   pantoprazole   40 mg Oral BID AC   sodium chloride  flush  3 mL Intravenous Q12H   Continuous Infusions:   LOS: 2 days   Almarie KANDICE Hoots, MD  08/28/2024, 9:44 AM

## 2024-08-28 NOTE — Plan of Care (Signed)
   Problem: Education: Goal: Knowledge of General Education information will improve Description: Including pain rating scale, medication(s)/side effects and non-pharmacologic comfort measures Outcome: Progressing   Problem: Clinical Measurements: Goal: Will remain free from infection Outcome: Progressing

## 2024-08-28 NOTE — Consult Note (Addendum)
" °  CLINICAL SUPPORT TEAM - WOUND OSTOMY AND CONTINENCE TEAM  CONSULTATION SERVICES   WOC Nurse-Inpatient Note   WOC Nurse Consult Note: Reason for Consult: Consult requested for left buttock.  Performed remotely after review of progress notes and photos in the EMR.  Pt has very atypical lesion to left buttock; brown wart-like raised nodules in a circular location to the left buttock, raised above skin level, one area larger then others at 3:00 o'clock to 5:00 o'clock, dark red-purple skin in the center. 4X5cm, according to bedside nurses' wound care flow sheet.  It was noted as present on admission and is NOT a pressure injury.  Appearance is consistent with possible carcinoma; Pt has a previous history of Basal cell cancer to another site.  This complex medical condition is beyond the scope of practice for WOC nurses; recommend patient follow-up with a dermatologist after discharge for a biopsy and further plan of care after discharge.      Topical treatment orders provided for bedside nurses to perform as follows: Apply Xeroform gauze to left buttock Q day, then cover with foam dressing.  Change foam dressing Q 3 days or PRN soiling.  Please re-consult if further assistance is needed.  Thank-you,  Stephane Fought MSN, RN, CWOCN, CWCN-AP, CNS Contact Mon-Fri 0700-1500: [phone]  "

## 2024-08-28 NOTE — Evaluation (Signed)
 Physical Therapy Evaluation Patient Details Name: [name] [name] MRN: [id number] DOB: 12-04-1940 Today's Date: 08/28/2024  History of Present Illness  84 yo female presents to therapy following hospital admission on 08/26/2024 due to DOE with B LE edema pt found to have acute on chronic systolic heart failure exacerbation with reduced ejection fraction. Pt PMH includes but is not limited to: A-fib, s/p watchman device (2024), PE, DVT, CKD III, GAVE, anemia, hyperparathyroidism, hypercalcemia, HTN, R THA, HLD, hypothyroidism and gout.  Clinical Impression    Pt admitted with above diagnosis.  Pt currently with functional limitations due to the deficits listed below (see PT Problem List). Pt in bed when PT arrived. Pt agreeable to therapy intervention. Pt reports back and L side flank pain. Pt reports increased frequency of urination. Pt is S for supine to sit, CGA/S for sit to stand to RW with min cues, gait tasks in personal room bed to bathroom and in hallway 60 feet with RW, CGA progressing to S, min cues and wide BOS with lateral sway and limited B knee flexion during swing phase, with exertion pt HR elevated into the 170s. Pt left seated in recliner and all needs in place. Pt will benefit from acute skilled PT to increase their independence and safety with mobility to allow discharge.         If plan is discharge home, recommend the following: A little help with walking and/or transfers;A little help with bathing/dressing/bathroom;Assistance with cooking/housework;Assist for transportation   Can travel by private vehicle        Equipment Recommendations None recommended by PT  Recommendations for Other Services       Functional Status Assessment Patient has had a recent decline in their functional status and demonstrates the ability to make significant improvements in function in a reasonable and predictable amount of time.     Precautions / Restrictions Precautions Precautions:  Fall Restrictions Weight Bearing Restrictions Per Provider Order: No      Mobility  Bed Mobility Overal bed mobility: Needs Assistance Bed Mobility: Supine to Sit     Supine to sit: Supervision, HOB elevated, Used rails     General bed mobility comments: min cues and increased time    Transfers Overall transfer level: Needs assistance Equipment used: Rolling walker (2 wheels) Transfers: Sit to/from Stand Sit to Stand: Supervision           General transfer comment: min cues, CGA for inital sit to stand from EOB and S for subsequent sit to stand from commode    Ambulation/Gait Ambulation/Gait assistance: Supervision Gait Distance (Feet): 60 Feet Assistive device: Rolling walker (2 wheels) Gait Pattern/deviations: Step-to pattern, Decreased step length - right, Decreased step length - left, Wide base of support, Trunk flexed Gait velocity: decreased     General Gait Details: lateral sway, slight trunk flexion with B UE support at RW and pt limiting B knee flexion during swing phase  Stairs            Wheelchair Mobility     Tilt Bed    Modified Rankin (Stroke Patients Only)       Balance Overall balance assessment: Mild deficits observed, not formally tested (static standing no UE support and no overt LOB)                                           Pertinent Vitals/Pain  Pain Assessment Pain Assessment: 0-10 Pain Score: 4  Pain Location: L LBP/flank pain Pain Descriptors / Indicators: Aching, Discomfort, Grimacing, Guarding (increases with trunk rotation and functional mobility tasks) Pain Intervention(s): Monitored during session, Repositioned    Home Living Family/patient expects to be discharged to:: Private residence Living Arrangements: Children Available Help at Discharge: Family Type of Home: House Home Access: Ramped entrance       Home Layout: One level Home Equipment: Agricultural Consultant (2 wheels);Rollator (4  wheels);Wheelchair - manual;Hospital bed      Prior Function Prior Level of Function : Independent/Modified Independent             Mobility Comments: mod I with use of rollator for all ADLs and self care tasks, pt reports she is able to do some meal prep and light household management       Extremity/Trunk Assessment        Lower Extremity Assessment Lower Extremity Assessment: Generalized weakness (B LE edema R > L)    Cervical / Trunk Assessment Cervical / Trunk Assessment: Normal  Communication   Communication Communication: No apparent difficulties    Cognition Arousal: Alert Behavior During Therapy: WFL for tasks assessed/performed   PT - Cognitive impairments: No apparent impairments                         Following commands: Intact       Cueing       General Comments General comments (skin integrity, edema, etc.): wound on R buttock nurse aware and attended to wound during PT eval, B LE edema    Exercises     Assessment/Plan    PT Assessment Patient needs continued PT services  PT Problem List Decreased activity tolerance;Decreased mobility;Cardiopulmonary status limiting activity       PT Treatment Interventions DME instruction;Gait training;Functional mobility training;Therapeutic activities;Therapeutic exercise;Balance training;Neuromuscular re-education;Patient/family education    PT Goals (Current goals can be found in the Care Plan section)  Acute Rehab PT Goals Patient Stated Goal: to be able to do more once I get home PT Goal Formulation: With patient Time For Goal Achievement: 09/11/24 Potential to Achieve Goals: Good    Frequency Min 3X/week     Co-evaluation               AM-PAC PT 6 Clicks Mobility  Outcome Measure Help needed turning from your back to your side while in a flat bed without using bedrails?: None Help needed moving from lying on your back to sitting on the side of a flat bed without using  bedrails?: None Help needed moving to and from a bed to a chair (including a wheelchair)?: A Little Help needed standing up from a chair using your arms (e.g., wheelchair or bedside chair)?: A Little Help needed to walk in hospital room?: A Little Help needed climbing 3-5 steps with a railing? : A Lot 6 Click Score: 19    End of Session Equipment Utilized During Treatment: Gait belt Activity Tolerance: No increased pain;Patient tolerated treatment well Patient left: in chair;with call bell/phone within reach Nurse Communication: Mobility status PT Visit Diagnosis: Unsteadiness on feet (R26.81);Other abnormalities of gait and mobility (R26.89);Muscle weakness (generalized) (M62.81);Pain Pain - Right/Left: Left Pain - part of body:  (back flank)    Time: 8844-8775 PT Time Calculation (min) (ACUTE ONLY): 29 min   Charges:   PT Evaluation $PT Eval Low Complexity: 1 Low PT Treatments $Therapeutic Activity: 8-22 mins PT General Charges $$  ACUTE PT VISIT: 1 Visit         [place], PT Acute Rehab   [place] VEAR Drone 08/28/2024, 1:22 PM

## 2024-08-28 NOTE — Progress Notes (Signed)
 "  Rounding Note   Patient Name: [name] [name] Date of Encounter: 08/28/2024  [facility] Cardiologist: Lynwood Schilling, MD   Subjective - No acute events overnight - Still has some shortness of breath but she feels a little bit better - Did not sleep well last night due to lower back pain and also had some reported nausea.  She still has some lower back pain but the nausea has resolved  Scheduled Meds:  allopurinol   100 mg Oral Daily   apixaban   5 mg Oral BID   atorvastatin   20 mg Oral Daily   dapagliflozin  propanediol  10 mg Oral Daily   ferrous sulfate   325 mg Oral Q breakfast   furosemide   40 mg Intravenous Q12H   levothyroxine   50 mcg Oral Q0600   metoprolol   200 mg Oral Daily   oxybutynin   10 mg Oral QHS   pantoprazole   40 mg Oral BID AC   sodium chloride  flush  3 mL Intravenous Q12H   Continuous Infusions:  PRN Meds: acetaminophen  **OR** acetaminophen , metoprolol  tartrate, ondansetron  **OR** ondansetron  (ZOFRAN ) IV, senna-docusate, traMADol    Vital Signs  Vitals:   08/27/24 2333 08/28/24 0500 08/28/24 0541 08/28/24 0905  BP: 130/88  126/87 109/71  Pulse: 98  87 84  Resp:   18   Temp: (!) 97.4 F (36.3 C)  97.9 F (36.6 C)   TempSrc: Oral  Oral   SpO2: 93%  94%   Weight:  93.3 kg    Height:        Intake/Output Summary (Last 24 hours) at 08/28/2024 1124 Last data filed at 08/28/2024 0847 Gross per 24 hour  Intake 460 ml  Output 950 ml  Net -490 ml      08/28/2024    5:00 AM 08/26/2024    9:44 PM 08/07/2024    8:57 AM  Last 3 Weights  Weight (lbs) 205 lb 11 oz 211 lb 12.8 oz 200 lb  Weight (kg) 93.3 kg 96.072 kg 90.719 kg      Telemetry Rate controlled atrial fibrillation- Personally Reviewed  ECG  No new ECG  Physical Exam  GEN: No acute distress.   Neck: No JVD Cardiac: Regular rate with irregularly irregular rhythm, no murmurs, rubs, or gallops.  Respiratory: Faint bibasilar rales, no wheezes or rhonchi GI: Soft, nontender,  non-distended  MS: 1+ pitting edema bilaterally; No deformity. Neuro:  Nonfocal  Psych: Normal affect   Labs High Sensitivity Troponin:  No results for input(s): TROPONINIHS in the last 720 hours.  Recent Labs  Lab 08/26/24 1633 08/26/24 1858  TRNPT 20* 22*       Chemistry Recent Labs  Lab 08/26/24 1633 08/27/24 0356 08/28/24 1000  NA 141 139 141  K 4.4 4.2 4.4  CL 108 107 105  CO2 23 21* 28  GLUCOSE 126* 111* 129*  BUN 36* 33* 33*  CREATININE 1.52* 1.41* 1.56*  CALCIUM  11.4* 11.5* 11.5*  MG  --  2.0  --   PROT 6.4*  --  6.0*  ALBUMIN  4.0  --  3.7  AST 15  --  15  ALT 11  --  9  ALKPHOS 126  --  122  BILITOT 0.5  --  0.4  GFRNONAA 34* 37* 33*  ANIONGAP 11 11 8     Lipids No results for input(s): CHOL, TRIG, HDL, LABVLDL, LDLCALC, CHOLHDL in the last 168 hours.  Hematology Recent Labs  Lab 08/26/24 1610 08/27/24 0356  WBC 6.1 5.3  RBC  4.22 3.95  HGB 12.1 11.4*  HCT 41.3 38.4  MCV 97.9 97.2  MCH 28.7 28.9  MCHC 29.3* 29.7*  RDW 18.1* 17.9*  PLT 218 186   Thyroid  No results for input(s): TSH, FREET4 in the last 168 hours.  BNP Recent Labs  Lab 08/26/24 1633  PROBNP 8,145.0*    DDimer No results for input(s): DDIMER in the last 168 hours.   Radiology  ECHOCARDIOGRAM COMPLETE Result Date: 08/27/2024    ECHOCARDIOGRAM REPORT   Patient Name:   [name] [name] Date of Exam: 08/27/2024 Medical Rec #:  [id number]           Height:       63.0 in Accession #:    [id number]          Weight:       211.9 lb Date of Birth:  08-Jul-1941           BSA:          1.982 m Patient Age:    84 years            BP:           142/101 mmHg Patient Gender: F                   HR:           88 bpm. Exam Location:  Inpatient Procedure: 2D Echo, Cardiac Doppler, Color Doppler and Intracardiac            Opacification Agent (Both Spectral and Color Flow Doppler were            utilized during procedure). Indications:    CHF  History:        Patient has prior history  of Echocardiogram examinations, most                 recent 01/15/2024. CHF, Arrythmias:Atrial Fibrillation; Risk                 Factors:Diabetes, Dyslipidemia and Hypertension.  Sonographer:    Odella Brewster Referring Phys: [id number] VISHAL R PATEL  Sonographer Comments: Technically difficult study due to poor echo windows. Image acquisition challenging due to patient body habitus and Image acquisition challenging due to respiratory motion. IMPRESSIONS  1. Left ventricular ejection fraction, by estimation, is 25 to 30%. The left ventricle has severely decreased function. The left ventricle demonstrates global hypokinesis. The left ventricular internal cavity size was moderately dilated. There is moderate eccentric left ventricular hypertrophy. Left ventricular diastolic function could not be evaluated. Elevated left atrial pressure.  2. Right ventricular systolic function is moderately reduced. The right ventricular size is mildly enlarged. There is moderately elevated pulmonary artery systolic pressure.  3. Left atrial size was severely dilated.  4. Right atrial size was severely dilated.  5. A small pericardial effusion is present.  6. The mitral valve is degenerative. Moderate mitral valve regurgitation. No evidence of mitral stenosis.  7. Tricuspid valve regurgitation is mild to moderate.  8. The aortic valve is tricuspid. Aortic valve regurgitation is not visualized. No aortic stenosis is present.  9. The inferior vena cava is dilated in size with <50% respiratory variability, suggesting right atrial pressure of 15 mmHg. Comparison(s): LV function has worsened. FINDINGS  Left Ventricle: Left ventricular ejection fraction, by estimation, is 25 to 30%. The left ventricle has severely decreased function. The left ventricle demonstrates global hypokinesis. Definity  contrast agent was given IV to delineate the left ventricular endocardial borders. The  left ventricular internal cavity size was moderately dilated. There  is moderate eccentric left ventricular hypertrophy. Left ventricular diastolic function could not be evaluated due to atrial fibrillation. Left ventricular diastolic function could not be evaluated. Elevated left atrial pressure. Right Ventricle: The right ventricular size is mildly enlarged. No increase in right ventricular wall thickness. Right ventricular systolic function is moderately reduced. There is moderately elevated pulmonary artery systolic pressure. The tricuspid regurgitant velocity is 3.18 m/s, and with an assumed right atrial pressure of 15 mmHg, the estimated right ventricular systolic pressure is 55.4 mmHg. Left Atrium: Left atrial size was severely dilated. Right Atrium: Right atrial size was severely dilated. Pericardium: A small pericardial effusion is present. Mitral Valve: The mitral valve is degenerative in appearance. Mild to moderate mitral annular calcification. Moderate mitral valve regurgitation. No evidence of mitral valve stenosis. MV peak gradient, 6.0 mmHg. The mean mitral valve gradient is 3.0 mmHg. Tricuspid Valve: The tricuspid valve is normal in structure. Tricuspid valve regurgitation is mild to moderate. No evidence of tricuspid stenosis. Aortic Valve: The aortic valve is tricuspid. Aortic valve regurgitation is not visualized. No aortic stenosis is present. Aortic valve mean gradient measures 1.0 mmHg. Aortic valve peak gradient measures 2.5 mmHg. Aortic valve area, by VTI measures 2.87 cm. Pulmonic Valve: The pulmonic valve was normal in structure. Pulmonic valve regurgitation is mild. No evidence of pulmonic stenosis. Aorta: The aortic root, ascending aorta and aortic arch are all structurally normal, with no evidence of dilitation or obstruction. Venous: The inferior vena cava is dilated in size with less than 50% respiratory variability, suggesting right atrial pressure of 15 mmHg. IAS/Shunts: The interatrial septum appears to be lipomatous. No atrial level shunt detected  by color flow Doppler. Additional Comments: 3D was performed not requiring image post processing on an independent workstation and was abnormal.  LEFT VENTRICLE PLAX 2D LVIDd:         5.70 cm      Diastology LVIDs:         4.80 cm      LV e' medial:    5.98 cm/s LV PW:         1.20 cm      LV E/e' medial:  20.9 LV IVS:        1.10 cm      LV e' lateral:   8.27 cm/s LVOT diam:     2.20 cm      LV E/e' lateral: 15.1 LV SV:         30 LV SV Index:   15 LVOT Area:     3.80 cm LV IVRT:       92 msec                             3D Volume EF:                             3D EF:        44 % LV Volumes (MOD)            LV EDV:       162 ml LV vol d, MOD A2C: 171.0 ml LV ESV:       90 ml LV vol d, MOD A4C: 127.0 ml LV SV:        72 ml LV vol s, MOD A2C: 112.0 ml LV vol s, MOD A4C: 84.3  ml LV SV MOD A2C:     59.0 ml LV SV MOD A4C:     127.0 ml LV SV MOD BP:      51.2 ml RIGHT VENTRICLE            IVC RV S prime:     6.85 cm/s  IVC diam: 2.70 cm TAPSE (M-mode): 0.9 cm LEFT ATRIUM              Index        RIGHT ATRIUM           Index LA diam:        6.00 cm  3.03 cm/m   RA Area:     36.00 cm LA Vol (A2C):   159.0 ml 80.24 ml/m  RA Volume:   139.00 ml 70.15 ml/m LA Vol (A4C):   183.0 ml 92.35 ml/m LA Biplane Vol: 173.0 ml 87.30 ml/m  AORTIC VALVE                    PULMONIC VALVE AV Area (Vmax):    2.79 cm     PV Vmax:          0.95 m/s AV Area (Vmean):   2.68 cm     PV Peak grad:     3.6 mmHg AV Area (VTI):     2.87 cm     PR End Diast Vel: 7.51 msec AV Vmax:           78.40 cm/s AV Vmean:          56.200 cm/s AV VTI:            0.106 m AV Peak Grad:      2.5 mmHg AV Mean Grad:      1.0 mmHg LVOT Vmax:         57.50 cm/s LVOT Vmean:        39.600 cm/s LVOT VTI:          0.080 m LVOT/AV VTI ratio: 0.75  AORTA Ao Root diam: 3.70 cm Ao Asc diam:  3.70 cm MITRAL VALVE                  TRICUSPID VALVE MV Area (PHT): 6.47 cm       TR Peak grad:   40.4 mmHg MV Area VTI:   1.35 cm       TR Vmax:        318.00 cm/s MV Peak  grad:  6.0 mmHg MV Mean grad:  3.0 mmHg       SHUNTS MV Vmax:       1.22 m/s       Systemic VTI:  0.08 m MV Vmean:      75.5 cm/s      Systemic Diam: 2.20 cm MR Peak grad:    87.8 mmHg MR Mean grad:    59.0 mmHg MR Vmax:         468.50 cm/s MR Vmean:        366.0 cm/s MR PISA:         3.08 cm MR PISA Eff ROA: 27 mm MR PISA Radius:  0.70 cm MV E velocity: 125.00 cm/s MV A velocity: 40.50 cm/s MV E/A ratio:  3.09 [name] [name] Electronically signed by [name] [name] Signature Date/Time: 08/27/2024/12:22:32 PM    Final    CT Angio Chest PE W and/or Wo Contrast Result Date: 08/26/2024 EXAM: CTA CHEST 08/26/2024 05:38:58 PM TECHNIQUE: CTA of the  chest was performed after the administration of intravenous contrast. Multiplanar reformatted images are provided for review. MIP images are provided for review. Automated exposure control, iterative reconstruction, and/or weight based adjustment of the mA/kV was utilized to reduce the radiation dose to as low as reasonably achievable. COMPARISON: Same day x-ray and CT 01/14/2024. CLINICAL HISTORY: Pulmonary embolism (PE) suspected, high prob. Suspected pulmonary embolism (PE), high probability. FINDINGS: PULMONARY ARTERIES: Reduced cardiac output as evidenced by slow pulmonary artery flow and mixing artifact in the segmental and subsegmental branches. There is no central pulmonary embolism. There is poor opacification in the right posterior lower lobe pulmonary arteries favored to represent mixing artifact as there is gradual tapering to non-opacification however pulmonary embolism cannot be excluded. Main pulmonary artery is dilated at 38 mm. MEDIASTINUM: Severe cardiomegaly. Atrial appendage occlusion device. New small pericardial effusion. Coronary artery and aortic atherosclerotic calcifications. Reflux of contrast into the IVC (inferior vena cava) and hepatic veins compatible with elevated right heart pressures. There is no acute abnormality of the thoracic aorta.  LYMPH NODES: No mediastinal, hilar or axillary lymphadenopathy. LUNGS AND PLEURA: Diffuse interlobular septal thickening and patchy ground glass opacities greatest in the lower lungs. Small right and trace left pleural effusions and compressive atelectasis. No pneumothorax. UPPER ABDOMEN: Limited images of the upper abdomen demonstrate reflux of contrast into the hepatic veins. SOFT TISSUES AND BONES: 2.5 cm left thyroid  nodule. No acute bone or soft tissue abnormality. IMPRESSION: 1. No central pulmonary embolism. Poor opacification in the right posterior lower lobe pulmonary arteries favored to represent mixing artifact as there is gradual tapering to non-opacification however pulmonary embolism cannot be excluded. 2. CHF with pulmonary edema, small right and trace left pleural effusions, elevated right heart pressures, and new small pericardial effusion. 3. 2.5 cm left thyroid  nodule; recommend non-emergent thyroid  ultrasound if not performed previously . Electronically signed by: Norman Gatlin MD 08/26/2024 05:57 PM EST RP Workstation: [id number]   DG Chest 2 View Result Date: 08/26/2024 CLINICAL DATA:  Chest heaviness. EXAM: CHEST - 2 VIEW COMPARISON:  April 19, 2024 FINDINGS: The cardiac silhouette is moderately enlarged and mildly increased in size when compared to the prior study low lung volumes are noted. A radiopaque left atrial appendage closure device is seen. There is mild prominence of the central pulmonary vasculature. Mild atelectasis and/or infiltrate is seen within the bilateral lung bases. A small right pleural effusion is present. No pneumothorax is identified. The visualized skeletal structures are unremarkable. IMPRESSION: 1. Mildly increased cardiomegaly with mild pulmonary vascular congestion. Sequelae associated with a pericardial effusion cannot be excluded. Further evaluation with chest CT is recommended. 2. Mild bibasilar atelectasis and/or infiltrate. 3. Small right pleural  effusion. Electronically Signed   By: Suzen Dials M.D.   On: 08/26/2024 16:42    Cardiac Studies   TTE 08/27/24:  IMPRESSIONS     1. Left ventricular ejection fraction, by estimation, is 25 to 30%. The  left ventricle has severely decreased function. The left ventricle  demonstrates global hypokinesis. The left ventricular internal cavity size  was moderately dilated. There is  moderate eccentric left ventricular hypertrophy. Left ventricular  diastolic function could not be evaluated. Elevated left atrial pressure.   2. Right ventricular systolic function is moderately reduced. The right  ventricular size is mildly enlarged. There is moderately elevated  pulmonary artery systolic pressure.   3. Left atrial size was severely dilated.   4. Right atrial size was severely dilated.   5. A small pericardial effusion  is present.   6. The mitral valve is degenerative. Moderate mitral valve regurgitation.  No evidence of mitral stenosis.   7. Tricuspid valve regurgitation is mild to moderate.   8. The aortic valve is tricuspid. Aortic valve regurgitation is not  visualized. No aortic stenosis is present.   9. The inferior vena cava is dilated in size with <50% respiratory  variability, suggesting right atrial pressure of 15 mmHg.   Patient Profile   [name] [name] is a 84 y.o. female with a hx of chronic HFmrEF, severe MR, moderate TR, persistent atrial fibrillation s/p LAAO with Watchman 05/2023, hypertension, CKD stage IIIb, iron  deficiency anemia, history of GI bleeding in the setting of GAVE, aortic atherosclerosis, recurrent PE/DVT, primary hyperparathyroidism, hypothyroidism, gout, who is being seen 08/27/2024 for the evaluation of acute CHF exacerbation at the request of Dr. Will.   Assessment & Plan   #Acute on Chronic HFrEF Exacerbation - Patient presented with 2 weeks of progressive shortness of breath and lower extremity swelling consistent with a CHF  exacerbation. - TTE demonstrated severe LV and moderate RV systolic dysfunction. - No clear trigger for her reduction in EF other than some dietary indiscretion.  Perhaps tachycardia mediated? - I also do not see where she has had an ischemic evaluation in the past, but denying any ischemic symptoms. - For now we will aggressively diurese and try to optimize her GDMT. - She has not been on any ARNI/ARB/ACEI in the past due to renal insufficiency; however, her renal function is not bad enough at this point to withhold these based on what I can tell.  Will consider adding during this hospitalization depending on what her renal function does. -Still appears volume up.  Will continue diuresis Continue IV Lasix  Continue dapagliflozin  10 mg daily Continue metoprolol  succinate 200 mg daily Consider adding Entresto vs ARB this admission   #Persistent Afib w/ RVR - Patient noted to be in atrial fibrillation with RVR on admission to low 100s. - Per chart review of her ECGs the last time she was in NSR was in 2024. - TTE shows severely dilated LA so rhythm control will be challenging; however, if I am unable to get her rates controlled with metoprolol  may need to consider amiodarone  given that this may be weakening her heart. Continue metoprolol  succinate 200 mg daily Continue Eliquis  5 mg twice daily Will have to consider amiodarone  for rhythm control if remains tachycardic -rates better today      For questions or updates, please contact [facility] Please consult [email or web address] for contact info under       Signed, Georganna Archer, MD  08/28/2024, 11:24 AM    "

## 2024-08-29 LAB — BASIC METABOLIC PANEL WITH GFR
Anion gap: 10 (ref 5–15)
BUN: 33 mg/dL — ABNORMAL HIGH (ref 8–23)
CO2: 27 mmol/L (ref 22–32)
Calcium: 11 mg/dL — ABNORMAL HIGH (ref 8.9–10.3)
Chloride: 103 mmol/L (ref 98–111)
Creatinine, Ser: 1.53 mg/dL — ABNORMAL HIGH (ref 0.44–1.00)
GFR, Estimated: 33 mL/min — ABNORMAL LOW
Glucose, Bld: 113 mg/dL — ABNORMAL HIGH (ref 70–99)
Potassium: 4 mmol/L (ref 3.5–5.1)
Sodium: 140 mmol/L (ref 135–145)

## 2024-08-29 LAB — MAGNESIUM: Magnesium: 2.1 mg/dL (ref 1.7–2.4)

## 2024-08-29 MED ORDER — LOSARTAN POTASSIUM 25 MG PO TABS
12.5000 mg | ORAL_TABLET | Freq: Every day | ORAL | Status: DC
  Administered 2024-08-29: 12.5 mg via ORAL
  Filled 2024-08-29: qty 1

## 2024-08-29 NOTE — Progress Notes (Addendum)
 Physical Therapy Treatment Patient Details Name: [name] [name] MRN: [id number] DOB: June 19, 1941 Today's Date: 08/29/2024   History of Present Illness 84 yo female presents to therapy following hospital admission on 08/26/2024 due to DOE with B LE edema pt found to have acute on chronic systolic heart failure exacerbation with reduced ejection fraction. Pt PMH includes but is not limited to: A-fib, s/p watchman device (2024), PE, DVT, CKD III, GAVE, anemia, hyperparathyroidism, hypercalcemia, HTN, R THA, HLD, hypothyroidism and gout.    PT Comments  AxO x3 pleasant and following all commands. Pt motivated to start PT today with the son and pastor present during session. Upon arrival pt was seen in the chair. During transfer performing sit to stand with supervision and CGA for safety. pt required no VC's shown proper saftey. Assisted pt to the bathroom showing stable balance and hand placement safety. Pt ambulated 80 ft with RW demonstrating slight trunk flexion with B UE support. pt stated  she uses a rollator at home. Pt had to stop walking due to 2/5 dyspnea with conversation. Pt ambulated back in the room sitting on the chair and took vitals: HR 110-120s and room air 94%. Pt left in the chair with call bell with the pastor and son present.   Pt rec discharge plan home health PT.   If plan is discharge home, recommend the following: A little help with walking and/or transfers;A little help with bathing/dressing/bathroom;Assistance with cooking/housework;Assist for transportation   Can travel by private vehicle        Equipment Recommendations  None recommended by PT    Recommendations for Other Services       Precautions / Restrictions Precautions Precautions: Fall Restrictions Weight Bearing Restrictions Per Provider Order: No     Mobility  Bed Mobility                    Transfers Overall transfer level: Needs assistance Equipment used: Rolling walker (2  wheels) Transfers: Sit to/from Stand Sit to Stand: Supervision, Contact guard assist           General transfer comment: during transfer supervison and CGA for safety. pt required no VC's shown proper saftey.    Ambulation/Gait Ambulation/Gait assistance: Supervision, Contact guard assist Gait Distance (Feet): 80 Feet Assistive device: Rolling walker (2 wheels) Gait Pattern/deviations: Wide base of support, Trunk flexed, Step-through pattern Gait velocity: decreased     General Gait Details: during ambulation pt had slight trunk flexion with B UE support. pt stated  she uses a rollator at home.   Stairs             Wheelchair Mobility     Tilt Bed    Modified Rankin (Stroke Patients Only)       Balance                                            Communication Communication Communication: No apparent difficulties  Cognition Arousal: Alert Behavior During Therapy: WFL for tasks assessed/performed   PT - Cognitive impairments: No apparent impairments                       PT - Cognition Comments: AxO x3 pleasant and following all commands. Pt motivated to start PT today. Following commands: Intact      Cueing    Exercises  General Comments        Pertinent Vitals/Pain      Home Living                          Prior Function            PT Goals (current goals can now be found in the care plan section) Progress towards PT goals: Progressing toward goals    Frequency    Min 3X/week      PT Plan      Co-evaluation              AM-PAC PT 6 Clicks Mobility   Outcome Measure  Help needed turning from your back to your side while in a flat bed without using bedrails?: None Help needed moving from lying on your back to sitting on the side of a flat bed without using bedrails?: None Help needed moving to and from a bed to a chair (including a wheelchair)?: A Little Help needed standing  up from a chair using your arms (e.g., wheelchair or bedside chair)?: A Little Help needed to walk in hospital room?: A Little Help needed climbing 3-5 steps with a railing? : A Lot 6 Click Score: 19    End of Session Equipment Utilized During Treatment: Gait belt Activity Tolerance: Patient tolerated treatment well Patient left: in chair;with call bell/phone within reach;with family/visitor present   PT Visit Diagnosis: Unsteadiness on feet (R26.81);Other abnormalities of gait and mobility (R26.89);Muscle weakness (generalized) (M62.81);Pain Pain - Right/Left: Left     Time: 8556-8493 PT Time Calculation (min) (ACUTE ONLY): 23 min  Charges:    $Gait Training: 8-22 mins $Therapeutic Activity: 8-22 mins PT General Charges $$ ACUTE PT VISIT: 1 Visit                     Laneta Edison, SPTA    I agree with the following treatment note.  This session was performed under the supervision of a licensed clinician  Katheryn Leap  PTA Acute  Rehabilitation Services Office M-F          [phone]

## 2024-08-29 NOTE — Progress Notes (Signed)
 Mobility Specialist - Progress Note:   08/29/24 1350  Mobility  Activity Ambulated with assistance  Level of Assistance Standby assist, set-up cues, supervision of patient - no hands on  Assistive Device Front wheel walker  Distance Ambulated (ft) 20 ft  Activity Response Tolerated well  Mobility Referral Yes  Mobility visit 1 Mobility  Mobility Specialist Start Time (ACUTE ONLY) 1304  Mobility Specialist Stop Time (ACUTE ONLY) 1321  Mobility Specialist Time Calculation (min) (ACUTE ONLY) 17 min   Assisted Pt to bathroom from recliner. Hr > 150 upon returning from bathroom, deferred further ambulation at this time. Returned to recliner with all needs met. Call bell in reach.  [employer] - Mobility Specialist - Acute Rehabilitation Can be reached via [employer]

## 2024-08-29 NOTE — Plan of Care (Signed)
  Problem: Education: Goal: Knowledge of General Education information will improve Description: Including pain rating scale, medication(s)/side effects and non-pharmacologic comfort measures Outcome: Progressing   Problem: Clinical Measurements: Goal: Diagnostic test results will improve Outcome: Progressing Goal: Respiratory complications will improve Outcome: Progressing Goal: Cardiovascular complication will be avoided Outcome: Progressing   Problem: Activity: Goal: Risk for activity intolerance will decrease Outcome: Progressing   Problem: Pain Managment: Goal: General experience of comfort will improve and/or be controlled Outcome: Progressing   Problem: Safety: Goal: Ability to remain free from injury will improve Outcome: Progressing

## 2024-08-29 NOTE — Progress Notes (Signed)
 ` PROGRESS NOTE    [name] [name]  FMW:[id number] DOB: [birth year] DOA: 08/26/2024 PCP: Joesph Annabella HERO, FNP   Brief Narrative: 84 y.o. female with medical history significant for persistent atrial fibrillation on Eliquis , s/p Watchman device 05/2023, HFmrEF, history of recurrent PE/DVT, CKD stage IIIb, GAVE, iron  deficiency anemia due to chronic blood loss, primary hyperparathyroidism with chronic hypercalcemia, HTN, HLD, hypothyroidism, gout admitted with dyspnea on exertion orthopnea and paroxysmal nocturnal dyspnea.  She has been taking Lasix  20 mg as needed.  She started taking it daily without improvement so she decided to come to the ED.  Her BNP was 8100, troponin T was 20, creatinine 1.52   Ct chest No central pulmonary embolism. Poor opacification in the right posterior lower lobe pulmonary arteries favored to represent mixing artifact as there is gradual tapering to non-opacification however pulmonary embolism cannot be excluded. CHF with pulmonary edema, small right and trace left pleural effusions, elevated right heart pressures, and new small pericardial effusion. 2.5 cm left thyroid  nodule; recommend non-emergent thyroid  ultrasound if not performed previously .     Assessment & Plan:   Principal Problem:   Acute on chronic heart failure with mildly reduced ejection fraction (HFmrEF, 41-49%) (HCC) Active Problems:   Persistent atrial fibrillation (HCC)   Chronic kidney disease, stage 3b (HCC)   Hypothyroidism   Dyslipidemia   Chronic gout without tophus   Hypercalcemia   Acute on chronic heart failure with reduced ejection fraction (HFrEF, <= 40%) and combined systolic and diastolic dysfunction (HCC)   Primary hyperparathyroidism   History of GAVE (gastric antral vascular ectasia)   History of pulmonary embolism   #1 acute on chronic systolic heart failure exacerbation patient admitted with orthopnea PND lower extremity edema with elevated BNP and findings  consistent with pulmonary edema.   Compared to her echo from June 2025 current echo reveals EF of 25 to 30% down from 40 to 45%, severely decreased function of the left ventricle with global hypokinesis moderately elevated pulmonary artery systolic pressure.  Severely dilated left atrium and right atrium small pericardial effusion.  Moderate MR. I&O indicates she is negative by 820 last 24 hrs Creatinine 1.53 from 1.41 from 1.62 on admission Lasix  dose increased to 60 mg twice daily Cardiology managing diuresis, appreciate. Continue Lasix  and beta-blocker and Farxiga . Defer to cardiology consider starting Entresto Consulted pt patient ambulated in the hallway yesterday with increasing heart rate to the 170s received Lopressor  2.5 mg x 1 dose.   #2 chronic atrial fibrillation status post Watchman device in 2024   Eliquis  and Toprol  TSH 3.790  #3 CKD stage IIIb stable will monitor closely on diuresis and adding Faille of losartan  12.5 mg daily  2/5  #4 iron  deficiency anemia due to blood loss she gets IV iron  transfusions through cancer center continue oral iron  and Protonix   #5 chronic hypercalcemia secondary to primary hyperparathyroidism status post partial parathyroidectomy  #6 hypothyroidism and thyroid  nodule outpatient thyroid  ultrasound  #7 hyperlipidemia on statin  #8 gout.  Allopurinol    Estimated body mass index is 36.55 kg/m as calculated from the following:   Height as of this encounter: 5' 3 (1.6 m).   Weight as of this encounter: 93.6 kg.  DVT prophylaxis: Eliquis  Code Status: DNR Family Communication: None Disposition Plan:  Status is: Inpatient Remains inpatient appropriate because: Acute illness   Consultants:  Cardiology  Procedures: None Antimicrobials: None Subjective: Feels better than yesterday no more nausea Walked in the hallway heart rate jumped up  Received one-time dose of Lopressor  2.5 mg Objective: Vitals:   08/28/24 1958 08/29/24 0443  08/29/24 0500 08/29/24 1215  BP: 122/78 123/72  125/73  Pulse: 88 84  92  Resp: 16 16    Temp: 98.1 F (36.7 C) 97.7 F (36.5 C)  98.5 F (36.9 C)  TempSrc:  Oral  Oral  SpO2: 93% 91%  97%  Weight:   93.6 kg   Height:        Intake/Output Summary (Last 24 hours) at 08/29/2024 1239 Last data filed at 08/29/2024 9062 Gross per 24 hour  Intake 480 ml  Output 1300 ml  Net -820 ml   Filed Weights   08/26/24 2144 08/28/24 0500 08/29/24 0500  Weight: 96.1 kg 93.3 kg 93.6 kg    Examination:  General exam: Appears in no acute distress  respiratory system: Diminished breath sounds at the bases. Respiratory effort normal. Cardiovascular system: irreg Gastrointestinal system: Abdomen is nondistended, soft and nontender. No organomegaly or masses felt. Normal bowel sounds heard. Central nervous system: Alert and oriented. No focal neurological deficits. Extremities: 1+ edema bilaterally   Data Reviewed: I have personally reviewed following labs and imaging studies  CBC: Recent Labs  Lab 08/26/24 1610 08/27/24 0356  WBC 6.1 5.3  NEUTROABS 4.2  --   HGB 12.1 11.4*  HCT 41.3 38.4  MCV 97.9 97.2  PLT 218 186   Basic Metabolic Panel: Recent Labs  Lab 08/26/24 1610 08/26/24 1633 08/27/24 0356 08/28/24 1000 08/29/24 1046  NA 140 141 139 141 140  K 4.4 4.4 4.2 4.4 4.0  CL 106 108 107 105 103  CO2 23 23 21* 28 27  GLUCOSE 128* 126* 111* 129* 113*  BUN 35* 36* 33* 33* 33*  CREATININE 1.62* 1.52* 1.41* 1.56* 1.53*  CALCIUM  12.0* 11.4* 11.5* 11.5* 11.0*  MG  --   --  2.0  --  2.1   GFR: Estimated Creatinine Clearance: 30.3 mL/min (A) (by C-G formula based on SCr of 1.53 mg/dL (H)). Liver Function Tests: Recent Labs  Lab 08/26/24 1633 08/28/24 1000  AST 15 15  ALT 11 9  ALKPHOS 126 122  BILITOT 0.5 0.4  PROT 6.4* 6.0*  ALBUMIN  4.0 3.7   No results for input(s): LIPASE, AMYLASE in the last 168 hours. No results for input(s): AMMONIA in the last 168  hours. Coagulation Profile: No results for input(s): INR, PROTIME in the last 168 hours. Cardiac Enzymes: No results for input(s): CKTOTAL, CKMB, CKMBINDEX, TROPONINI in the last 168 hours. BNP (last 3 results) Recent Labs    04/19/24 1632 08/26/24 1633  PROBNP 3,865* 8,145.0*   HbA1C: No results for input(s): HGBA1C in the last 72 hours. CBG: No results for input(s): GLUCAP in the last 168 hours. Lipid Profile: No results for input(s): CHOL, HDL, LDLCALC, TRIG, CHOLHDL, LDLDIRECT in the last 72 hours. Thyroid  Function Tests: No results for input(s): TSH, T4TOTAL, FREET4, T3FREE, THYROIDAB in the last 72 hours. Anemia Panel: No results for input(s): VITAMINB12, FOLATE, FERRITIN, TIBC, IRON , RETICCTPCT in the last 72 hours. Sepsis Labs: No results for input(s): PROCALCITON, LATICACIDVEN in the last 168 hours.  No results found for this or any previous visit (from the past 240 hours).       Radiology Studies: No results found.   Scheduled Meds:  allopurinol   100 mg Oral Daily   apixaban   5 mg Oral BID   atorvastatin   20 mg Oral Daily   dapagliflozin  propanediol  10 mg Oral Daily  ferrous sulfate   325 mg Oral Q breakfast   furosemide   60 mg Intravenous Q12H   levothyroxine   50 mcg Oral Q0600   metoprolol   200 mg Oral Daily   oxybutynin   10 mg Oral QHS   pantoprazole   40 mg Oral BID AC   sodium chloride  flush  3 mL Intravenous Q12H   Continuous Infusions:   LOS: 3 days   [name] KANDICE Hoots, MD  08/29/2024, 12:39 PM

## 2024-08-29 NOTE — TOC Initial Note (Signed)
 Transition of Care [facility]) - Initial/Assessment Note    Patient Details  Name: [name] [name] MRN: [id number] Date of Birth: [birth year]  [facility] CM/SW Contact:    Tawni CHRISTELLA Eva, LCSW Phone Number: 08/29/2024, 4:31 PM  Clinical Narrative:                  Pt is from home. CSW discussed recommendations for home health services. Pt is agreeable and would like [facility], as she has used this agency in the past. Pt reports having a walker and no other DME needs. Family will provide transportation home upon discharge. Referral sent out to [facility] for review. ICM to follow.  Expected Discharge Plan: Home w Home Health Services Barriers to Discharge: Continued Medical Work up   Patient Goals and CMS Choice Patient states their goals for this hospitalization and ongoing recovery are:: retrun home with home health CMS Medicare.gov Compare Post Acute Care list provided to:: Patient Choice offered to / list presented to : Patient      Expected Discharge Plan and Services       Living arrangements for the past 2 months: Single Family Home                                      Prior Living Arrangements/Services Living arrangements for the past 2 months: Single Family Home Lives with:: Self Patient language and need for interpreter reviewed:: Yes Do you feel safe going back to the place where you live?: Yes      Need for Family Participation in Patient Care: No (Comment) Care giver support system in place?: No (comment) Current home services: DME Criminal Activity/Legal Involvement Pertinent to Current Situation/Hospitalization: No - Comment as needed  Activities of Daily Living   ADL Screening (condition at time of admission) Independently performs ADLs?: No Does the patient have a NEW difficulty with bathing/dressing/toileting/self-feeding that is expected to last >3 days?: No Does the patient have a NEW difficulty with getting in/out of bed,  walking, or climbing stairs that is expected to last >3 days?: Yes (Initiates electronic notice to provider for possible PT consult) Does the patient have a NEW difficulty with communication that is expected to last >3 days?: No Is the patient deaf or have difficulty hearing?: No Does the patient have difficulty seeing, even when wearing glasses/contacts?: No Does the patient have difficulty concentrating, remembering, or making decisions?: No  Permission Sought/Granted                  Emotional Assessment Appearance:: Appears stated age Attitude/Demeanor/Rapport: Gracious Affect (typically observed): Accepting Orientation: : Oriented to Place, Oriented to  Time, Oriented to Situation, Oriented to Self   Psych Involvement: No (comment)  Admission diagnosis:  Atrial fibrillation with RVR (HCC) [I48.91] Acute on chronic congestive heart failure, unspecified heart failure type (HCC) [I50.9] Acute on chronic heart failure with mildly reduced ejection fraction (HFmrEF, 41-49%) (HCC) [I50.23] Patient Active Problem List   Diagnosis Date Noted   Acute on chronic heart failure with mildly reduced ejection fraction (HFmrEF, 41-49%) (HCC) 08/26/2024   Symptomatic anemia 04/20/2024   Persistent atrial fibrillation (HCC) 04/20/2024   History of pulmonary embolism 04/20/2024   GI bleeding 04/20/2024   Chronic kidney disease, stage 3b (HCC) 02/04/2024   Pulmonary embolism (HCC) 01/15/2024   Atrial fibrillation with RVR (HCC) 06/15/2023   Presence of Watchman left atrial appendage closure device  06/15/2023   Microalbuminuria 04/20/2023   Melena 01/23/2023   History of GAVE (gastric antral vascular ectasia) 01/23/2023   Hyperlipidemia associated with type 2 diabetes mellitus (HCC) 10/20/2022   Primary hyperparathyroidism 10/30/2021   MR (congenital mitral regurgitation) 08/01/2021   Nonrheumatic tricuspid valve regurgitation 02/24/2021   Acute on chronic heart failure with reduced  ejection fraction (HFrEF, <= 40%) and combined systolic and diastolic dysfunction (HCC)    Nonrheumatic mitral valve regurgitation    HFrEF (heart failure with reduced ejection fraction) (HCC) 02/07/2021   Hypercalcemia 02/07/2021   Iron  deficiency anemia    Sciatica of right side 10/18/2018   Seborrheic keratosis 10/17/2016   Dyslipidemia 10/17/2016   Overactive bladder 10/17/2016   Gastroesophageal reflux disease without esophagitis 10/17/2016   Primary osteoarthritis involving multiple joints 10/17/2016   Chronic gout without tophus 10/17/2016   Type 2 diabetes mellitus with stage 3b chronic kidney disease, without long-term current use of insulin  (HCC) 05/16/2016   Hypertension associated with type 2 diabetes mellitus (HCC) 05/16/2016   Hypothyroidism 05/16/2016   Anemia 10/30/2013   Morbid obesity (HCC) 10/30/2013   S/P right THA, AA 10/29/2013   PCP:  Joesph Annabella HERO, FNP Pharmacy:   [facility] [address], [place] - [address] [place] [address] Phone: [phone] Fax: [phone]  [facility] [address] [place] [address] Phone: [phone] Fax: [phone]     Social Drivers of Health (SDOH) Social History: SDOH Screenings   Food Insecurity: No Food Insecurity (08/26/2024)  Housing: Low Risk (08/26/2024)  Transportation Needs: No Transportation Needs (08/26/2024)  Utilities: Not At Risk (08/26/2024)  Alcohol  Screen: Low Risk (12/27/2022)  Depression (PHQ2-9): Low Risk (07/29/2024)  Financial Resource Strain: Low Risk (07/26/2024)  Physical Activity: Insufficiently Active (07/26/2024)  Social Connections: Moderately Integrated (08/26/2024)  Stress: No Stress Concern Present (07/26/2024)  Tobacco Use: Low Risk (08/27/2024)  Health Literacy: Adequate Health Literacy (12/28/2023)   SDOH Interventions:     Readmission Risk Interventions    04/22/2024   10:06 AM 01/16/2024    4:05 PM  Readmission Risk  Prevention Plan  Transportation Screening Complete Complete  PCP or Specialist Appt within 5-7 Days Complete Complete  Home Care Screening Complete Complete  Medication Review (RN CM) Referral to Pharmacy Complete

## 2024-08-29 NOTE — Plan of Care (Signed)

## 2024-08-29 NOTE — Progress Notes (Signed)
 "  Rounding Note   Patient Name: [name] [name] Date of Encounter: 08/29/2024  [facility] Cardiologist: Lynwood Schilling, MD   Subjective - Had some fast heart rate overnight and received IV metoprolol  2.5 mg x1 - No complaints or symptoms today.  Overall states that she is feeling better.  Scheduled Meds:  allopurinol   100 mg Oral Daily   apixaban   5 mg Oral BID   atorvastatin   20 mg Oral Daily   dapagliflozin  propanediol  10 mg Oral Daily   ferrous sulfate   325 mg Oral Q breakfast   furosemide   60 mg Intravenous Q12H   levothyroxine   50 mcg Oral Q0600   metoprolol   200 mg Oral Daily   oxybutynin   10 mg Oral QHS   pantoprazole   40 mg Oral BID AC   sodium chloride  flush  3 mL Intravenous Q12H   Continuous Infusions:  PRN Meds: acetaminophen  **OR** acetaminophen , metoprolol  tartrate, ondansetron  **OR** ondansetron  (ZOFRAN ) IV, senna-docusate, traMADol    Vital Signs  Vitals:   08/28/24 1958 08/29/24 0443 08/29/24 0500 08/29/24 1215  BP: 122/78 123/72  125/73  Pulse: 88 84  92  Resp: 16 16    Temp: 98.1 F (36.7 C) 97.7 F (36.5 C)  98.5 F (36.9 C)  TempSrc:  Oral  Oral  SpO2: 93% 91%  97%  Weight:   93.6 kg   Height:        Intake/Output Summary (Last 24 hours) at 08/29/2024 1256 Last data filed at 08/29/2024 1210 Gross per 24 hour  Intake 480 ml  Output 2050 ml  Net -1570 ml      08/29/2024    5:00 AM 08/28/2024    5:00 AM 08/26/2024    9:44 PM  Last 3 Weights  Weight (lbs) 206 lb 5.6 oz 205 lb 11 oz 211 lb 12.8 oz  Weight (kg) 93.6 kg 93.3 kg 96.072 kg      Telemetry Rate controlled atrial fibrillation, occasional PVCs- Personally Reviewed  ECG  Atrial fibrillation with RVR -personally reviewed  Physical Exam  GEN: No acute distress.   Neck: No JVD Cardiac: Regular rate with irregularly irregular rhythm, no murmurs, rubs, or gallops.  Respiratory: Faint bibasilar rales, no wheezes or rhonchi GI: Soft, nontender, non-distended  MS: Trace  pitting edema bilaterally; No deformity. Neuro:  Nonfocal  Psych: Normal affect   Labs High Sensitivity Troponin:  No results for input(s): TROPONINIHS in the last 720 hours.  Recent Labs  Lab 08/26/24 1633 08/26/24 1858  TRNPT 20* 22*       Chemistry Recent Labs  Lab 08/26/24 1633 08/27/24 0356 08/28/24 1000 08/29/24 1046  NA 141 139 141 140  K 4.4 4.2 4.4 4.0  CL 108 107 105 103  CO2 23 21* 28 27  GLUCOSE 126* 111* 129* 113*  BUN 36* 33* 33* 33*  CREATININE 1.52* 1.41* 1.56* 1.53*  CALCIUM  11.4* 11.5* 11.5* 11.0*  MG  --  2.0  --  2.1  PROT 6.4*  --  6.0*  --   ALBUMIN  4.0  --  3.7  --   AST 15  --  15  --   ALT 11  --  9  --   ALKPHOS 126  --  122  --   BILITOT 0.5  --  0.4  --   GFRNONAA 34* 37* 33* 33*  ANIONGAP 11 11 8 10     Lipids No results for input(s): CHOL, TRIG, HDL, LABVLDL, LDLCALC, CHOLHDL in the last  168 hours.  Hematology Recent Labs  Lab 08/26/24 1610 08/27/24 0356  WBC 6.1 5.3  RBC 4.22 3.95  HGB 12.1 11.4*  HCT 41.3 38.4  MCV 97.9 97.2  MCH 28.7 28.9  MCHC 29.3* 29.7*  RDW 18.1* 17.9*  PLT 218 186   Thyroid  No results for input(s): TSH, FREET4 in the last 168 hours.  BNP Recent Labs  Lab 08/26/24 1633  PROBNP 8,145.0*    DDimer No results for input(s): DDIMER in the last 168 hours.   Radiology  No results found.   Cardiac Studies   TTE 08/27/24:  IMPRESSIONS     1. Left ventricular ejection fraction, by estimation, is 25 to 30%. The  left ventricle has severely decreased function. The left ventricle  demonstrates global hypokinesis. The left ventricular internal cavity size  was moderately dilated. There is  moderate eccentric left ventricular hypertrophy. Left ventricular  diastolic function could not be evaluated. Elevated left atrial pressure.   2. Right ventricular systolic function is moderately reduced. The right  ventricular size is mildly enlarged. There is moderately elevated  pulmonary  artery systolic pressure.   3. Left atrial size was severely dilated.   4. Right atrial size was severely dilated.   5. A small pericardial effusion is present.   6. The mitral valve is degenerative. Moderate mitral valve regurgitation.  No evidence of mitral stenosis.   7. Tricuspid valve regurgitation is mild to moderate.   8. The aortic valve is tricuspid. Aortic valve regurgitation is not  visualized. No aortic stenosis is present.   9. The inferior vena cava is dilated in size with <50% respiratory  variability, suggesting right atrial pressure of 15 mmHg.   Patient Profile   [name] [name] is a 84 y.o. female with a hx of chronic HFmrEF, severe MR, moderate TR, persistent atrial fibrillation s/p LAAO with Watchman 05/2023, hypertension, CKD stage IIIb, iron  deficiency anemia, history of GI bleeding in the setting of GAVE, aortic atherosclerosis, recurrent PE/DVT, primary hyperparathyroidism, hypothyroidism, gout, who is being seen 08/27/2024 for the evaluation of acute CHF exacerbation at the request of Dr. Will.   Assessment & Plan   #Acute on Chronic HFrEF Exacerbation - Patient presented with 2 weeks of progressive shortness of breath and lower extremity swelling consistent with a CHF exacerbation. - TTE demonstrated severe LV and moderate RV systolic dysfunction. - No clear trigger for her reduction in EF other than some dietary indiscretion.  Perhaps tachycardia mediated? - I also do not see where she has had an ischemic evaluation in the past, but denying any ischemic symptoms. - For now we will aggressively diurese and try to optimize her GDMT. - She has not been on any ARNI/ARB/ACEI in the past due to renal insufficiency; however, her renal function is not bad enough at this point to withhold these based on what I can tell.  Will consider adding during this hospitalization depending on what her renal function does. -Still appears volume up but is improving  significantly. Continue IV Lasix  -increased dose to 60 mg twice daily Continue dapagliflozin  10 mg daily Continue metoprolol  succinate 200 mg daily -will consider fractionating to 100 twice daily depending on heart rates but currently looks good Creatinines have remained stable.  Will trial losartan  12.5 mg daily   #Persistent Afib w/ RVR - Patient noted to be in atrial fibrillation with RVR on admission to low 100s. - Per chart review of her ECGs the last time she was in NSR was  in 2024. - TTE shows severely dilated LA so rhythm control will be challenging; however, if I am unable to get her rates controlled with metoprolol  may need to consider amiodarone  given that this may be weakening her heart. Continue metoprolol  succinate 200 mg daily Continue Eliquis  5 mg twice daily Will have to consider amiodarone  for rhythm control if remains tachycardic       For questions or updates, please contact [facility] Please consult [email or web address] for contact info under       Signed, Georganna Archer, MD  08/29/2024, 12:56 PM    "

## 2024-08-30 ENCOUNTER — Inpatient Hospital Stay (HOSPITAL_COMMUNITY)

## 2024-08-30 LAB — BASIC METABOLIC PANEL WITH GFR
Anion gap: 11 (ref 5–15)
BUN: 33 mg/dL — ABNORMAL HIGH (ref 8–23)
CO2: 28 mmol/L (ref 22–32)
Calcium: 11.5 mg/dL — ABNORMAL HIGH (ref 8.9–10.3)
Chloride: 103 mmol/L (ref 98–111)
Creatinine, Ser: 1.43 mg/dL — ABNORMAL HIGH (ref 0.44–1.00)
GFR, Estimated: 36 mL/min — ABNORMAL LOW
Glucose, Bld: 104 mg/dL — ABNORMAL HIGH (ref 70–99)
Potassium: 3.6 mmol/L (ref 3.5–5.1)
Sodium: 141 mmol/L (ref 135–145)

## 2024-08-30 LAB — CBC
HCT: 38 % (ref 36.0–46.0)
Hemoglobin: 11.3 g/dL — ABNORMAL LOW (ref 12.0–15.0)
MCH: 28.8 pg (ref 26.0–34.0)
MCHC: 29.7 g/dL — ABNORMAL LOW (ref 30.0–36.0)
MCV: 96.7 fL (ref 80.0–100.0)
Platelets: 175 10*3/uL (ref 150–400)
RBC: 3.93 MIL/uL (ref 3.87–5.11)
RDW: 17.7 % — ABNORMAL HIGH (ref 11.5–15.5)
WBC: 5.1 10*3/uL (ref 4.0–10.5)
nRBC: 0 % (ref 0.0–0.2)

## 2024-08-30 LAB — MAGNESIUM: Magnesium: 2 mg/dL (ref 1.7–2.4)

## 2024-08-30 MED ORDER — BISACODYL 5 MG PO TBEC
10.0000 mg | DELAYED_RELEASE_TABLET | Freq: Every day | ORAL | Status: AC
  Administered 2024-08-30: 10 mg via ORAL
  Filled 2024-08-30: qty 2

## 2024-08-30 MED ORDER — TORSEMIDE 20 MG PO TABS: 40.0000 mg | ORAL_TABLET | Freq: Every day | ORAL | Status: AC

## 2024-08-30 MED ORDER — MELATONIN 3 MG PO TABS
3.0000 mg | ORAL_TABLET | Freq: Every day | ORAL | Status: AC
  Administered 2024-08-30: 3 mg via ORAL
  Filled 2024-08-30: qty 1

## 2024-08-30 MED ORDER — LOSARTAN POTASSIUM 25 MG PO TABS
25.0000 mg | ORAL_TABLET | Freq: Every day | ORAL | Status: AC
  Administered 2024-08-30: 25 mg via ORAL
  Filled 2024-08-30: qty 1

## 2024-08-30 NOTE — Progress Notes (Signed)
 24 hr urine collection started at 12:30 pm on 08/30/24, will end at 12:30 on 2/7

## 2024-08-30 NOTE — Plan of Care (Signed)

## 2024-08-30 NOTE — Progress Notes (Signed)
 ` PROGRESS NOTE    [name] [name]  FMW:[id number] DOB: 1941-07-07 DOA: 08/26/2024 PCP: Joesph Annabella HERO, FNP   Brief Narrative: 84 y.o. female with medical history significant for persistent atrial fibrillation on Eliquis , s/p Watchman device 05/2023, HFmrEF, history of recurrent PE/DVT, CKD stage IIIb, GAVE, iron  deficiency anemia due to chronic blood loss, primary hyperparathyroidism with chronic hypercalcemia, HTN, HLD, hypothyroidism, gout admitted with dyspnea on exertion orthopnea and paroxysmal nocturnal dyspnea.  She has been taking Lasix  20 mg as needed.  She started taking it daily without improvement so she decided to come to the ED.  Her BNP was 8100, troponin T was 20, creatinine 1.52 Ct chest No central pulmonary embolism. Poor opacification in the right posterior lower lobe pulmonary arteries favored to represent mixing artifact as there is gradual tapering to non-opacification however pulmonary embolism cannot be excluded. CHF with pulmonary edema, small right and trace left pleural effusions, elevated right heart pressures, and new small pericardial effusion. 2.5 cm left thyroid  nodule; recommend non-emergent thyroid  ultrasound if not performed previously .     Assessment & Plan:   Principal Problem:   Acute on chronic heart failure with mildly reduced ejection fraction (HFmrEF, 41-49%) (HCC) Active Problems:   Persistent atrial fibrillation (HCC)   Chronic kidney disease, stage 3b (HCC)   Hypothyroidism   Dyslipidemia   Chronic gout without tophus   Hypercalcemia   Acute on chronic heart failure with reduced ejection fraction (HFrEF, <= 40%) and combined systolic and diastolic dysfunction (HCC)   Primary hyperparathyroidism   History of GAVE (gastric antral vascular ectasia)   History of pulmonary embolism   #1 Acute on chronic systolic heart failure exacerbation patient admitted with orthopnea PND lower extremity edema with elevated BNP and findings  consistent with pulmonary edema.   Compared to her echo from June 2025 current echo reveals EF of 25 to 30% down from 40 to 45%, severely decreased function of the left ventricle with global hypokinesis moderately elevated pulmonary artery systolic pressure.  Severely dilated left atrium and right atrium small pericardial effusion.  Moderate MR. I&O indicates she is negative by 1921 last 24 hrs Creatinine 1.43 from 1.53 from 1.41 from 1.62 on admission Lasix  dose increased to 60 mg twice daily Cardiology managing diuresis, appreciate. Continue Lasix  and beta-blocker and Farxiga . Defer to cardiology consider starting Entresto   #2 chronic atrial fibrillation status post Watchman device in 2024   Eliquis  and Toprol  TSH 3.790  #3 CKD stage IIIb stable will monitor closely on diuresis and adding losartan  12.5 mg daily  2/5  #4 iron  deficiency anemia due to blood loss she gets IV iron  transfusions through cancer center continue oral iron  and Protonix   #5 chronic hypercalcemia secondary to primary hyperparathyroidism status post partial parathyroidectomy She had a parathyroidectomy done by Dr. Eletha in 1999 she had a right inferior parathyroid  adenoma at that time. Will send a PTH and urine calcium  while she is here She saw Dr. Rito in 2024 April and was due to follow-up with him She is followed by Dr. Sam with endo  #6 hypothyroidism and thyroid  nodule outpatient thyroid  ultrasound  #7 hyperlipidemia on statin  #8 gout.  Allopurinol   #9 constipation she takes Dulcolax at home she has not had a bowel movement since coming to the hospital.  Will restart Dulcolax. She has chronic constipation possibly due to her chronic hypercalcemia   Estimated body mass index is 35.58 kg/m as calculated from the following:   Height as of  this encounter: 5' 3 (1.6 m).   Weight as of this encounter: 91.1 kg.  DVT prophylaxis: Eliquis  Code Status: DNR Family Communication: None Disposition  Plan:  Status is: Inpatient Remains inpatient appropriate because: Acute illness   Consultants:  Cardiology  Procedures: None Antimicrobials: None Subjective:  She had a better night Breathing seems to be better than yesterday creatinine 1.43 she complains of constipation  Objective: Vitals:   08/29/24 1215 08/29/24 2045 08/30/24 0433 08/30/24 0444  BP: 125/73 130/74  136/86  Pulse: 92 83  93  Resp:  20  18  Temp: 98.5 F (36.9 C) 98.5 F (36.9 C)  98.4 F (36.9 C)  TempSrc: Oral Oral  Oral  SpO2: 97% 93%  92%  Weight:   91.1 kg   Height:        Intake/Output Summary (Last 24 hours) at 08/30/2024 0923 Last data filed at 08/30/2024 0751 Gross per 24 hour  Intake 960 ml  Output 2900 ml  Net -1940 ml   Filed Weights   08/28/24 0500 08/29/24 0500 08/30/24 0433  Weight: 93.3 kg 93.6 kg 91.1 kg    Examination:  General exam: Appears in no acute distress  respiratory system: Diminished breath sounds at the bases. Respiratory effort normal. Cardiovascular system: irreg Gastrointestinal system: Abdomen is nondistended, soft and nontender. No organomegaly or masses felt. Normal bowel sounds heard. Central nervous system: Alert and oriented. No focal neurological deficits. Extremities: 1+ edema bilaterally   Data Reviewed: I have personally reviewed following labs and imaging studies  CBC: Recent Labs  Lab 08/26/24 1610 08/27/24 0356 08/30/24 0410  WBC 6.1 5.3 5.1  NEUTROABS 4.2  --   --   HGB 12.1 11.4* 11.3*  HCT 41.3 38.4 38.0  MCV 97.9 97.2 96.7  PLT 218 186 175   Basic Metabolic Panel: Recent Labs  Lab 08/26/24 1633 08/27/24 0356 08/28/24 1000 08/29/24 1046 08/30/24 0410  NA 141 139 141 140 141  K 4.4 4.2 4.4 4.0 3.6  CL 108 107 105 103 103  CO2 23 21* 28 27 28   GLUCOSE 126* 111* 129* 113* 104*  BUN 36* 33* 33* 33* 33*  CREATININE 1.52* 1.41* 1.56* 1.53* 1.43*  CALCIUM  11.4* 11.5* 11.5* 11.0* 11.5*  MG  --  2.0  --  2.1 2.0   GFR: Estimated  Creatinine Clearance: 32 mL/min (A) (by C-G formula based on SCr of 1.43 mg/dL (H)). Liver Function Tests: Recent Labs  Lab 08/26/24 1633 08/28/24 1000  AST 15 15  ALT 11 9  ALKPHOS 126 122  BILITOT 0.5 0.4  PROT 6.4* 6.0*  ALBUMIN  4.0 3.7   No results for input(s): LIPASE, AMYLASE in the last 168 hours. No results for input(s): AMMONIA in the last 168 hours. Coagulation Profile: No results for input(s): INR, PROTIME in the last 168 hours. Cardiac Enzymes: No results for input(s): CKTOTAL, CKMB, CKMBINDEX, TROPONINI in the last 168 hours. BNP (last 3 results) Recent Labs    04/19/24 1632 08/26/24 1633  PROBNP 3,865* 8,145.0*   HbA1C: No results for input(s): HGBA1C in the last 72 hours. CBG: No results for input(s): GLUCAP in the last 168 hours. Lipid Profile: No results for input(s): CHOL, HDL, LDLCALC, TRIG, CHOLHDL, LDLDIRECT in the last 72 hours. Thyroid  Function Tests: No results for input(s): TSH, T4TOTAL, FREET4, T3FREE, THYROIDAB in the last 72 hours. Anemia Panel: No results for input(s): VITAMINB12, FOLATE, FERRITIN, TIBC, IRON , RETICCTPCT in the last 72 hours. Sepsis Labs: No results for input(s): PROCALCITON,  LATICACIDVEN in the last 168 hours.  No results found for this or any previous visit (from the past 240 hours).       Radiology Studies: No results found.   Scheduled Meds:  allopurinol   100 mg Oral Daily   apixaban   5 mg Oral BID   atorvastatin   20 mg Oral Daily   bisacodyl   10 mg Oral Daily   dapagliflozin  propanediol  10 mg Oral Daily   ferrous sulfate   325 mg Oral Q breakfast   furosemide   60 mg Intravenous Q12H   levothyroxine   50 mcg Oral Q0600   losartan   25 mg Oral Daily   metoprolol   200 mg Oral Daily   oxybutynin   10 mg Oral QHS   pantoprazole   40 mg Oral BID AC   sodium chloride  flush  3 mL Intravenous Q12H   Continuous Infusions:   LOS: 4 days   Almarie KANDICE Hoots, MD  08/30/2024, 9:23 AM

## 2024-08-30 NOTE — Progress Notes (Signed)
 "  Rounding Note   Patient Name: [name] [name] Date of Encounter: 08/30/2024  [facility] Cardiologist: Lynwood Schilling, MD   Subjective - No acute events overnight. - Patient is doing well today without complaints. - She feels close to being back to normal.  Scheduled Meds:  allopurinol   100 mg Oral Daily   apixaban   5 mg Oral BID   atorvastatin   20 mg Oral Daily   dapagliflozin  propanediol  10 mg Oral Daily   ferrous sulfate   325 mg Oral Q breakfast   furosemide   60 mg Intravenous Q12H   levothyroxine   50 mcg Oral Q0600   losartan   25 mg Oral Daily   metoprolol   200 mg Oral Daily   oxybutynin   10 mg Oral QHS   pantoprazole   40 mg Oral BID AC   sodium chloride  flush  3 mL Intravenous Q12H   Continuous Infusions:  PRN Meds: acetaminophen  **OR** acetaminophen , metoprolol  tartrate, ondansetron  **OR** ondansetron  (ZOFRAN ) IV, senna-docusate, traMADol    Vital Signs  Vitals:   08/29/24 1215 08/29/24 2045 08/30/24 0433 08/30/24 0444  BP: 125/73 130/74  136/86  Pulse: 92 83  93  Resp:  20  18  Temp: 98.5 F (36.9 C) 98.5 F (36.9 C)  98.4 F (36.9 C)  TempSrc: Oral Oral  Oral  SpO2: 97% 93%  92%  Weight:   91.1 kg   Height:        Intake/Output Summary (Last 24 hours) at 08/30/2024 0710 Last data filed at 08/30/2024 0400 Gross per 24 hour  Intake 960 ml  Output 2300 ml  Net -1340 ml      08/30/2024    4:33 AM 08/29/2024    5:00 AM 08/28/2024    5:00 AM  Last 3 Weights  Weight (lbs) 200 lb 13.4 oz 206 lb 5.6 oz 205 lb 11 oz  Weight (kg) 91.1 kg 93.6 kg 93.3 kg      Telemetry Rate controlled atrial fibrillation- Personally Reviewed  ECG  No new ECG  Physical Exam  GEN: No acute distress.   Neck: No JVD Cardiac: Regular rate with irregularly irregular rhythm, no murmurs, rubs, or gallops.  Respiratory: Faint bibasilar rales, no wheezes or rhonchi GI: Soft, nontender, non-distended  MS: Very trace pitting edema bilaterally; No deformity. Neuro:   Nonfocal  Psych: Normal affect   Labs High Sensitivity Troponin:  No results for input(s): TROPONINIHS in the last 720 hours.  Recent Labs  Lab 08/26/24 1633 08/26/24 1858  TRNPT 20* 22*       Chemistry Recent Labs  Lab 08/26/24 1633 08/27/24 0356 08/28/24 1000 08/29/24 1046 08/30/24 0410  NA 141 139 141 140 141  K 4.4 4.2 4.4 4.0 3.6  CL 108 107 105 103 103  CO2 23 21* 28 27 28   GLUCOSE 126* 111* 129* 113* 104*  BUN 36* 33* 33* 33* 33*  CREATININE 1.52* 1.41* 1.56* 1.53* 1.43*  CALCIUM  11.4* 11.5* 11.5* 11.0* 11.5*  MG  --  2.0  --  2.1 2.0  PROT 6.4*  --  6.0*  --   --   ALBUMIN  4.0  --  3.7  --   --   AST 15  --  15  --   --   ALT 11  --  9  --   --   ALKPHOS 126  --  122  --   --   BILITOT 0.5  --  0.4  --   --   [facility]  34* 37* 33* 33* 36*  ANIONGAP 11 11 8 10 11     Lipids No results for input(s): CHOL, TRIG, HDL, LABVLDL, LDLCALC, CHOLHDL in the last 168 hours.  Hematology Recent Labs  Lab 08/26/24 1610 08/27/24 0356 08/30/24 0410  WBC 6.1 5.3 5.1  RBC 4.22 3.95 3.93  HGB 12.1 11.4* 11.3*  HCT 41.3 38.4 38.0  MCV 97.9 97.2 96.7  MCH 28.7 28.9 28.8  MCHC 29.3* 29.7* 29.7*  RDW 18.1* 17.9* 17.7*  PLT 218 186 175   Thyroid  No results for input(s): TSH, FREET4 in the last 168 hours.  BNP Recent Labs  Lab 08/26/24 1633  PROBNP 8,145.0*    DDimer No results for input(s): DDIMER in the last 168 hours.   Radiology  No results found.   Cardiac Studies   TTE 08/27/24:  IMPRESSIONS     1. Left ventricular ejection fraction, by estimation, is 25 to 30%. The  left ventricle has severely decreased function. The left ventricle  demonstrates global hypokinesis. The left ventricular internal cavity size  was moderately dilated. There is  moderate eccentric left ventricular hypertrophy. Left ventricular  diastolic function could not be evaluated. Elevated left atrial pressure.   2. Right ventricular systolic function is  moderately reduced. The right  ventricular size is mildly enlarged. There is moderately elevated  pulmonary artery systolic pressure.   3. Left atrial size was severely dilated.   4. Right atrial size was severely dilated.   5. A small pericardial effusion is present.   6. The mitral valve is degenerative. Moderate mitral valve regurgitation.  No evidence of mitral stenosis.   7. Tricuspid valve regurgitation is mild to moderate.   8. The aortic valve is tricuspid. Aortic valve regurgitation is not  visualized. No aortic stenosis is present.   9. The inferior vena cava is dilated in size with <50% respiratory  variability, suggesting right atrial pressure of 15 mmHg.   Patient Profile   [name] [name] is a 84 y.o. female with a hx of chronic HFmrEF, severe MR, moderate TR, persistent atrial fibrillation s/p LAAO with Watchman 05/2023, hypertension, CKD stage IIIb, iron  deficiency anemia, history of GI bleeding in the setting of GAVE, aortic atherosclerosis, recurrent PE/DVT, primary hyperparathyroidism, hypothyroidism, gout, who is being seen 08/27/2024 for the evaluation of acute CHF exacerbation at the request of Dr. Will.   Assessment & Plan   #Acute on Chronic HFrEF Exacerbation - Patient presented with 2 weeks of progressive shortness of breath and lower extremity swelling consistent with a CHF exacerbation. - TTE demonstrated severe LV and moderate RV systolic dysfunction. - No clear trigger for her reduction in EF other than some dietary indiscretion.  Perhaps tachycardia mediated? - I also do not see where she has had an ischemic evaluation in the past, but denying any ischemic symptoms. - For now we will aggressively diurese and try to optimize her GDMT. - She has not been on any ARNI/ARB/ACEI in the past due to renal insufficiency; however, her renal function is not bad enough at this point to withhold these based on what I can tell.  Will consider adding during this  hospitalization depending on what her renal function does. - Feels that she is very close to being euvolemic.  She already received an IV dose of Lasix  today so we will plan to transition to an oral diuretic tomorrow and likely discharge. Got 1 dose of IV Lasix  60 mg today Plan to start torsemide  40 mg daily tomorrow  Continue dapagliflozin  10 mg daily Continue metoprolol  succinate 200 mg daily Increase losartan  to 25 mg daily   #Persistent Afib w/ RVR - Patient noted to be in atrial fibrillation with RVR on admission to low 100s. - Per chart review of her ECGs the last time she was in NSR was in 2024. - TTE shows severely dilated LA so rhythm control will be challenging; however, if I am unable to get her rates controlled with metoprolol  may need to consider amiodarone  given that this may be weakening her heart. Continue metoprolol  succinate 200 mg daily Continue Eliquis  5 mg twice daily Will have to consider amiodarone  for rhythm control if remains tachycardic       For questions or updates, please contact [facility] Please consult [email or web address] for contact info under       Signed, Georganna Archer, MD  08/30/2024, 7:10 AM    "

## 2024-08-30 NOTE — Progress Notes (Signed)
 Mobility Specialist - Progress Note:  90-105 bpm HR  08/30/24 1348  Mobility  Activity  (Chair Exercises)  Range of Motion/Exercises Active  Activity Response Tolerated fair  Mobility Referral Yes  Mobility visit 1 Mobility  Mobility Specialist Start Time (ACUTE ONLY) 1329  Mobility Specialist Stop Time (ACUTE ONLY) 1341  Mobility Specialist Time Calculation (min) (ACUTE ONLY) 12 min   Pt was received in recliner and agreed to mobility. Pt stated stiffness in RLE, opted for chair exercises: Seated BLE Exercises: 10 reps each  1) Ankle Pumps  2) Knee Extension (hold 3 seconds)  3) Marching    4) Hip Adduction (pillow squeezes)   Pt returned to recliner with all needs met. Call bell in reach.  [employer] - Mobility Specialist - Acute Rehabilitation Can be reached via [employer]

## 2024-09-03 ENCOUNTER — Inpatient Hospital Stay: Attending: Physician Assistant

## 2024-09-17 ENCOUNTER — Inpatient Hospital Stay: Attending: Physician Assistant

## 2024-09-17 ENCOUNTER — Inpatient Hospital Stay

## 2024-10-01 ENCOUNTER — Inpatient Hospital Stay: Attending: Physician Assistant

## 2024-10-15 ENCOUNTER — Inpatient Hospital Stay

## 2024-10-15 ENCOUNTER — Inpatient Hospital Stay: Attending: Physician Assistant | Admitting: Physician Assistant

## 2025-01-27 ENCOUNTER — Ambulatory Visit: Admitting: Family Medicine
